# Patient Record
Sex: Male | Born: 1940 | Race: White | Hispanic: No | Marital: Married | Smoking: Never smoker
Health system: Southern US, Community
[De-identification: ages and names within clinical notes are randomized; demographics above are authoritative.]

## PROBLEM LIST (undated history)

## (undated) DIAGNOSIS — I214 Non-ST elevation (NSTEMI) myocardial infarction: Secondary | ICD-10-CM

## (undated) DIAGNOSIS — G6181 Chronic inflammatory demyelinating polyneuritis: Secondary | ICD-10-CM

## (undated) DIAGNOSIS — M159 Polyosteoarthritis, unspecified: Secondary | ICD-10-CM

## (undated) DIAGNOSIS — H547 Unspecified visual loss: Secondary | ICD-10-CM

## (undated) DIAGNOSIS — C186 Malignant neoplasm of descending colon: Secondary | ICD-10-CM

## (undated) DIAGNOSIS — M549 Dorsalgia, unspecified: Secondary | ICD-10-CM

## (undated) DIAGNOSIS — I059 Rheumatic mitral valve disease, unspecified: Secondary | ICD-10-CM

## (undated) DIAGNOSIS — Z8739 Personal history of other diseases of the musculoskeletal system and connective tissue: Secondary | ICD-10-CM

## (undated) DIAGNOSIS — I2510 Atherosclerotic heart disease of native coronary artery without angina pectoris: Secondary | ICD-10-CM

## (undated) DIAGNOSIS — G4733 Obstructive sleep apnea (adult) (pediatric): Secondary | ICD-10-CM

## (undated) DIAGNOSIS — K118 Other diseases of salivary glands: Secondary | ICD-10-CM

## (undated) DIAGNOSIS — D472 Monoclonal gammopathy: Secondary | ICD-10-CM

## (undated) DIAGNOSIS — I509 Heart failure, unspecified: Secondary | ICD-10-CM

## (undated) DIAGNOSIS — R011 Cardiac murmur, unspecified: Secondary | ICD-10-CM

## (undated) DIAGNOSIS — E785 Hyperlipidemia, unspecified: Secondary | ICD-10-CM

## (undated) DIAGNOSIS — I10 Essential (primary) hypertension: Secondary | ICD-10-CM

## (undated) DIAGNOSIS — J189 Pneumonia, unspecified organism: Secondary | ICD-10-CM

## (undated) DIAGNOSIS — T4145XA Adverse effect of unspecified anesthetic, initial encounter: Secondary | ICD-10-CM

## (undated) DIAGNOSIS — R002 Palpitations: Secondary | ICD-10-CM

## (undated) DIAGNOSIS — K5792 Diverticulitis of intestine, part unspecified, without perforation or abscess without bleeding: Secondary | ICD-10-CM

## (undated) DIAGNOSIS — D649 Anemia, unspecified: Secondary | ICD-10-CM

## (undated) DIAGNOSIS — G4730 Sleep apnea, unspecified: Secondary | ICD-10-CM

## (undated) DIAGNOSIS — M4810 Ankylosing hyperostosis [Forestier], site unspecified: Secondary | ICD-10-CM

## (undated) DIAGNOSIS — Z9989 Dependence on other enabling machines and devices: Secondary | ICD-10-CM

## (undated) DIAGNOSIS — Z950 Presence of cardiac pacemaker: Secondary | ICD-10-CM

## (undated) DIAGNOSIS — N189 Chronic kidney disease, unspecified: Secondary | ICD-10-CM

## (undated) DIAGNOSIS — G629 Polyneuropathy, unspecified: Secondary | ICD-10-CM

## (undated) DIAGNOSIS — E559 Vitamin D deficiency, unspecified: Secondary | ICD-10-CM

## (undated) DIAGNOSIS — C189 Malignant neoplasm of colon, unspecified: Secondary | ICD-10-CM

## (undated) DIAGNOSIS — Z87442 Personal history of urinary calculi: Secondary | ICD-10-CM

## (undated) DIAGNOSIS — K219 Gastro-esophageal reflux disease without esophagitis: Secondary | ICD-10-CM

## (undated) DIAGNOSIS — N401 Enlarged prostate with lower urinary tract symptoms: Secondary | ICD-10-CM

## (undated) DIAGNOSIS — D869 Sarcoidosis, unspecified: Secondary | ICD-10-CM

## (undated) DIAGNOSIS — Z86010 Personal history of colonic polyps: Secondary | ICD-10-CM

## (undated) DIAGNOSIS — J45909 Unspecified asthma, uncomplicated: Secondary | ICD-10-CM

## (undated) DIAGNOSIS — G8929 Other chronic pain: Secondary | ICD-10-CM

## (undated) DIAGNOSIS — H269 Unspecified cataract: Secondary | ICD-10-CM

## (undated) DIAGNOSIS — T8859XA Other complications of anesthesia, initial encounter: Secondary | ICD-10-CM

## (undated) DIAGNOSIS — G622 Polyneuropathy due to other toxic agents: Secondary | ICD-10-CM

## (undated) DIAGNOSIS — H4010X0 Unspecified open-angle glaucoma, stage unspecified: Secondary | ICD-10-CM

## (undated) DIAGNOSIS — J449 Chronic obstructive pulmonary disease, unspecified: Secondary | ICD-10-CM

## (undated) DIAGNOSIS — G6289 Other specified polyneuropathies: Secondary | ICD-10-CM

## (undated) DIAGNOSIS — G619 Inflammatory polyneuropathy, unspecified: Secondary | ICD-10-CM

## (undated) HISTORY — DX: Unspecified asthma, uncomplicated: J45.909

## (undated) HISTORY — DX: Essential (primary) hypertension: I10

## (undated) HISTORY — DX: Vitamin D deficiency, unspecified: E55.9

## (undated) HISTORY — DX: Sleep apnea, unspecified: G47.30

## (undated) HISTORY — DX: Personal history of colonic polyps: Z86.010

## (undated) HISTORY — DX: Unspecified visual loss: H54.7

## (undated) HISTORY — DX: Monoclonal gammopathy: D47.2

## (undated) HISTORY — DX: Rheumatic mitral valve disease, unspecified: I05.9

## (undated) HISTORY — DX: Ankylosing hyperostosis (forestier), site unspecified: M48.10

## (undated) HISTORY — DX: Malignant neoplasm of colon, unspecified: C18.9

## (undated) HISTORY — DX: Chronic kidney disease, unspecified: N18.9

## (undated) HISTORY — DX: Malignant neoplasm of descending colon: C18.6

## (undated) HISTORY — DX: Polyosteoarthritis, unspecified: M15.9

## (undated) HISTORY — DX: Diverticulitis of intestine, part unspecified, without perforation or abscess without bleeding: K57.92

## (undated) HISTORY — PX: EYE SURGERY: SHX253

## (undated) HISTORY — DX: Heart failure, unspecified: I50.9

## (undated) HISTORY — PX: COLONOSCOPY: SHX174

## (undated) HISTORY — DX: Gastro-esophageal reflux disease without esophagitis: K21.9

## (undated) HISTORY — DX: Sarcoidosis, unspecified: D86.9

## (undated) HISTORY — DX: Hyperlipidemia, unspecified: E78.5

## (undated) HISTORY — PX: CATARACT EXTRACTION W/ INTRAOCULAR LENS IMPLANT: SHX1309

## (undated) HISTORY — DX: Unspecified cataract: H26.9

## (undated) HISTORY — DX: Inflammatory polyneuropathy, unspecified: G61.9

## (undated) HISTORY — DX: Chronic obstructive pulmonary disease, unspecified: J44.9

## (undated) HISTORY — DX: Polyneuropathy due to other toxic agents: G62.2

## (undated) HISTORY — DX: Other specified polyneuropathies: G62.89

## (undated) HISTORY — DX: Chronic inflammatory demyelinating polyneuritis: G61.81

## (undated) HISTORY — DX: Atherosclerotic heart disease of native coronary artery without angina pectoris: I25.10

## (undated) HISTORY — DX: Benign prostatic hyperplasia with lower urinary tract symptoms: N40.1

## (undated) HISTORY — PX: MOHS SURGERY: SUR867

## (undated) HISTORY — DX: Palpitations: R00.2

## (undated) HISTORY — DX: Pneumonia, unspecified organism: J18.9

## (undated) HISTORY — DX: Other diseases of salivary glands: K11.8

---
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
## 1998-12-27 HISTORY — PX: COLON SURGERY: SHX602

## 1999-12-28 HISTORY — PX: LUNG SURGERY: SHX703

## 2001-12-27 HISTORY — PX: KNEE ARTHROSCOPY: SHX127

## 2008-12-27 DIAGNOSIS — K118 Other diseases of salivary glands: Secondary | ICD-10-CM

## 2008-12-27 HISTORY — DX: Other diseases of salivary glands: K11.8

## 2008-12-27 HISTORY — PX: PROSTATE BIOPSY: SHX241

## 2010-05-28 ENCOUNTER — Ambulatory Visit: Payer: Self-pay | Admitting: Internal Medicine

## 2010-05-28 DIAGNOSIS — C186 Malignant neoplasm of descending colon: Secondary | ICD-10-CM

## 2010-05-28 DIAGNOSIS — R351 Nocturia: Secondary | ICD-10-CM

## 2010-05-28 DIAGNOSIS — M19041 Primary osteoarthritis, right hand: Secondary | ICD-10-CM | POA: Insufficient documentation

## 2010-05-28 DIAGNOSIS — G619 Inflammatory polyneuropathy, unspecified: Secondary | ICD-10-CM

## 2010-05-28 DIAGNOSIS — E559 Vitamin D deficiency, unspecified: Secondary | ICD-10-CM | POA: Insufficient documentation

## 2010-05-28 DIAGNOSIS — Z860100 Personal history of colon polyps, unspecified: Secondary | ICD-10-CM | POA: Insufficient documentation

## 2010-05-28 DIAGNOSIS — D869 Sarcoidosis, unspecified: Secondary | ICD-10-CM

## 2010-05-28 DIAGNOSIS — I059 Rheumatic mitral valve disease, unspecified: Secondary | ICD-10-CM | POA: Insufficient documentation

## 2010-05-28 DIAGNOSIS — J4520 Mild intermittent asthma, uncomplicated: Secondary | ICD-10-CM | POA: Insufficient documentation

## 2010-05-28 DIAGNOSIS — E785 Hyperlipidemia, unspecified: Secondary | ICD-10-CM

## 2010-05-28 DIAGNOSIS — G622 Polyneuropathy due to other toxic agents: Secondary | ICD-10-CM | POA: Insufficient documentation

## 2010-05-28 DIAGNOSIS — R002 Palpitations: Secondary | ICD-10-CM | POA: Insufficient documentation

## 2010-05-28 DIAGNOSIS — J45901 Unspecified asthma with (acute) exacerbation: Secondary | ICD-10-CM | POA: Insufficient documentation

## 2010-05-28 DIAGNOSIS — N401 Enlarged prostate with lower urinary tract symptoms: Secondary | ICD-10-CM | POA: Insufficient documentation

## 2010-05-28 DIAGNOSIS — G4733 Obstructive sleep apnea (adult) (pediatric): Secondary | ICD-10-CM | POA: Insufficient documentation

## 2010-05-28 DIAGNOSIS — M19042 Primary osteoarthritis, left hand: Secondary | ICD-10-CM | POA: Insufficient documentation

## 2010-05-28 DIAGNOSIS — K219 Gastro-esophageal reflux disease without esophagitis: Secondary | ICD-10-CM | POA: Insufficient documentation

## 2010-05-28 DIAGNOSIS — Z85038 Personal history of other malignant neoplasm of large intestine: Secondary | ICD-10-CM | POA: Insufficient documentation

## 2010-05-28 DIAGNOSIS — J45909 Unspecified asthma, uncomplicated: Secondary | ICD-10-CM

## 2010-05-28 DIAGNOSIS — M159 Polyosteoarthritis, unspecified: Secondary | ICD-10-CM

## 2010-05-28 DIAGNOSIS — N138 Other obstructive and reflux uropathy: Secondary | ICD-10-CM

## 2010-05-28 HISTORY — DX: Other obstructive and reflux uropathy: N13.8

## 2010-05-28 HISTORY — DX: Obstructive sleep apnea (adult) (pediatric): G47.33

## 2010-05-28 HISTORY — DX: Benign prostatic hyperplasia with lower urinary tract symptoms: N40.1

## 2010-05-28 HISTORY — DX: Sarcoidosis, unspecified: D86.9

## 2010-05-28 HISTORY — DX: Palpitations: R00.2

## 2010-05-28 HISTORY — DX: Polyosteoarthritis, unspecified: M15.9

## 2010-05-28 HISTORY — DX: Vitamin D deficiency, unspecified: E55.9

## 2010-05-28 HISTORY — DX: Gastro-esophageal reflux disease without esophagitis: K21.9

## 2010-05-28 HISTORY — DX: Hyperlipidemia, unspecified: E78.5

## 2010-05-28 HISTORY — DX: Inflammatory polyneuropathy, unspecified: G62.2

## 2010-05-28 HISTORY — DX: Personal history of colonic polyps: Z86.010

## 2010-05-28 HISTORY — DX: Inflammatory polyneuropathy, unspecified: G61.9

## 2010-05-28 HISTORY — DX: Malignant neoplasm of descending colon: C18.6

## 2010-05-28 HISTORY — DX: Unspecified asthma, uncomplicated: J45.909

## 2010-05-28 HISTORY — DX: Rheumatic mitral valve disease, unspecified: I05.9

## 2010-05-28 HISTORY — DX: Personal history of colon polyps, unspecified: Z86.0100

## 2011-01-26 NOTE — Assessment & Plan Note (Signed)
Summary: COLD SYMPTOMS/ACUTE VISIT ONLY-PT AWARE/OK MEN/CD   Vital Signs:  Patient profile:   70 year old male Height:      70 inches (177.80 cm) Weight:      244.0 pounds (110.91 kg) BMI:     35.14 O2 Sat:      96 % on Room air Temp:     99.5 degrees F (37.50 degrees C) oral Pulse rate:   73 / minute BP sitting:   144 / 82  (left arm) Cuff size:   large  Vitals Entered By: Orlan Leavens (May 28, 2010 2:06 PM)  O2 Flow:  Room air CC: Cold sxs Is Patient Diabetic? No Pain Assessment Patient in pain? no        CC:  Cold sxs.  History of Present Illness: 1 month history of persistent respiratory infection and cough with exacerbation of Asthma such that he is using bronchodilator much more than usual. He has been feeling short of breath in addition to the increased wheezing. He has a h/o pnuemonia and is concerned for a recurrence.   Current Medications (verified): 1)  Norvasc 10 Mg Tabs (Amlodipine Besylate) .... Take 1 By Mouth Once Daily 2)  Triamterene-Hctz 37.5-25 Mg Tabs (Triamterene-Hctz) .... Take 1 By Mouth Once Daily 3)  Prevacid 30 Mg Cpdr (Lansoprazole) .... Take 1 By Mouth Once Daily 4)  Gabapentin 800 Mg Tabs (Gabapentin) .... Take 1 Three Times A Day 5)  Aspirin 81 Mg Tbec (Aspirin) .... Take 1 By Mouth Once Daily 6)  Multivitamins  Tabs (Multiple Vitamin) .... Take 1 By Mouth Once Daily 7)  Vitamin D3 5000 Unit Caps (Cholecalciferol) .... Take 1 By Mouth Once Daily 8)  Pulmicort Flexhaler 90 Mcg/act Aepb (Budesonide) .... Take 2 Puffs Two Times A Day 9)  Proventil Hfa 108 (90 Base) Mcg/act Aers (Albuterol Sulfate) .... 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 Lyla Glassing, [place], [place]              Oncologist/immunologist - Dr. Anell Barr, [place], [place]              Laurette Schimke -  Dr. Brooke Dare, [place]. [place]              General Surgeon - Dr. Zettie Cooley----(?) [place], [place]              Thoracic Surgeon - Dr. Carin Primrose, [place], [place]              Internist - Dr. 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 [place] End of Life - provided discussive context and provided packet  Review of Systems       The patient complains of fever, decreased hearing, peripheral edema, and prolonged cough.  The patient denies anorexia, weight loss, weight gain, hoarseness, chest pain, syncope, dyspnea on exertion, headaches, abdominal pain, severe indigestion/heartburn, muscle weakness, difficulty walking, depression, enlarged lymph nodes, and breast masses.    Physical Exam  General:  WNWD heavy set white male in no distress Head:  normocephalic and atraumatic.   Eyes:  corneas and lenses clear and  no injection.   Neck:  supple and no thyromegaly.   Chest Wall:  no deformities.   Lungs:  normal respiratory effort, no intercostal retractions, no crackles, and no wheezes, mild rhonchi in lower lung fields Heart:  normal rate, regular rhythm, and no murmur.   Abdomen:  soft, non-tender, and normal bowel sounds.     Impression & Recommendations:  Problem # 1:  BRONCHITIS, ACUTE (ICD-466.0) Mild acute bronchitis with probable cyclical cough  plan - doxycycline 100mg  two times a day x 7 days           for cough: benzonatate perles 100mg  three times a day x 10, prednisone for 10 days, prom/cod syrup q 6 as needed.  His updated medication list for this problem includes:    Pulmicort Flexhaler 90 Mcg/act Aepb (Budesonide) .[name]... Take 2 puffs two times a day    Proventil Hfa 108 (90 Base) Mcg/act Aers (Albuterol sulfate) ..... Use as needed    Doxycycline Hyclate 100 Mg Caps (Doxycycline hyclate) .[name]... 1 by mouth two times a day x 7 for bronchitis    Benzonatate 100 Mg Caps (Benzonatate) .[name]... 1 by mouth three times a day    Promethazine-codeine 6.25-10 Mg/87ml Syrp (Promethazine-codeine) .[name]... 1 -2 tsp q 6 as needed  Complete Medication List: 1)  Norvasc 10 Mg Tabs (Amlodipine besylate) .... Take 1 by mouth once daily 2)  Triamterene-hctz 37.5-25 Mg Tabs (Triamterene-hctz) .... Take 1 by mouth once daily 3)  Prevacid 30 Mg Cpdr (Lansoprazole) .... Take 1 by mouth once daily 4)  Gabapentin 800 Mg Tabs (Gabapentin) .... Take 1 three times a day 5)  Aspirin 81 Mg Tbec (Aspirin) .... Take 1 by mouth once daily 6)  Multivitamins Tabs (Multiple vitamin) .... Take 1 by mouth once daily 7)  Vitamin D3 5000 Unit Caps (Cholecalciferol) .... Take 1 by mouth once daily 8)  Pulmicort Flexhaler 90 Mcg/act Aepb (Budesonide) .... Take 2 puffs two times a day 9)  Proventil Hfa 108 (90 Base) Mcg/act Aers (Albuterol sulfate) .... Use as needed 10)  Doxycycline Hyclate 100 Mg Caps (Doxycycline hyclate) .[name]..  1 by mouth two times a day x 7 for bronchitis 11)  Benzonatate 100 Mg Caps (Benzonatate) .[name].. 1 by mouth three times a day 12)  Prednisone 20 Mg Tabs (Prednisone) .[name].. 1 by mouth once daily x 10 13)  Promethazine-codeine 6.25-10 Mg/65ml Syrp (Promethazine-codeine) .[name].. 1 -2 tsp q 6 as needed Prescriptions: PROMETHAZINE-CODEINE 6.25-10 MG/5ML SYRP (PROMETHAZINE-CODEINE) 1 -2 tsp q 6 as needed  #8 oz x 1   Entered and Authorized by:   Jacques Navy MD   Signed by:   Jacques Navy MD on 05/28/2010   Method used:   Handwritten   RxID:   [id number] PREDNISONE 20 MG TABS (PREDNISONE)  1 by mouth once daily x 10  #10 x 0   Entered and Authorized by:   Jacques Navy MD   Signed by:   Jacques Navy MD on 05/28/2010   Method used:   Electronically to        [facility] (retail)       [address]       [place], [place]  [address]       Ph: [phone]       Fax: [phone]   RxID:   [id number] BENZONATATE 100 MG CAPS (BENZONATATE) 1 by mouth three times a day  #30 x 1   Entered and Authorized by:   Jacques Navy MD   Signed by:   Jacques Navy MD on 05/28/2010   Method used:   Electronically to        [facility] (retail)       [address]       [name], [place]  [address]       Ph: [phone]       Fax: [phone]   RxID:   [id number] DOXYCYCLINE HYCLATE 100 MG CAPS (DOXYCYCLINE HYCLATE) 1 by mouth two times a day x 7 for bronchitis  #14 x 0   Entered and Authorized by:   Jacques Navy MD   Signed by:   Jacques Navy MD on 05/28/2010   Method used:   Electronically to        [facility] (retail)       [address]       [place], [place]  [address]       Ph: [phone]       Fax: [phone]   RxID:   [id number]

## 2011-06-01 ENCOUNTER — Inpatient Hospital Stay (HOSPITAL_COMMUNITY)
Admission: EM | Admit: 2011-06-01 | Discharge: 2011-06-02 | Disposition: A | Discharge status: Other Acute Inpatient Hospital | Payer: Medicare Other | Source: Home / Self Care | Attending: Internal Medicine | Admitting: Internal Medicine

## 2011-06-01 ENCOUNTER — Emergency Department (HOSPITAL_COMMUNITY): Payer: Medicare Other

## 2011-06-01 LAB — DIFFERENTIAL
Basophils Absolute: 0.1 10*3/uL (ref 0.0–0.1)
Basophils Relative: 1 % (ref 0–1)
Eosinophils Absolute: 0.5 10*3/uL (ref 0.0–0.7)
Eosinophils Relative: 7 % — ABNORMAL HIGH (ref 0–5)
Lymphocytes Relative: 20 % (ref 12–46)
Lymphs Abs: 1.4 10*3/uL (ref 0.7–4.0)
Monocytes Absolute: 0.9 10*3/uL (ref 0.1–1.0)
Monocytes Relative: 13 % — ABNORMAL HIGH (ref 3–12)
Neutro Abs: 3.9 10*3/uL (ref 1.7–7.7)
Neutrophils Relative %: 59 % (ref 43–77)

## 2011-06-01 LAB — CK TOTAL AND CKMB (NOT AT ARMC)
CK, MB: 5.9 ng/mL — ABNORMAL HIGH (ref 0.3–4.0)
Relative Index: 2.2 (ref 0.0–2.5)
Total CK: 263 U/L — ABNORMAL HIGH (ref 7–232)

## 2011-06-01 LAB — CBC
HCT: 45 % (ref 39.0–52.0)
Hemoglobin: 16 g/dL (ref 13.0–17.0)
MCH: 31.1 pg (ref 26.0–34.0)
MCHC: 35.6 g/dL (ref 30.0–36.0)
MCV: 87.5 fL (ref 78.0–100.0)
Platelets: 201 10*3/uL (ref 150–400)
RBC: 5.14 MIL/uL (ref 4.22–5.81)
RDW: 12.8 % (ref 11.5–15.5)
WBC: 6.7 10*3/uL (ref 4.0–10.5)

## 2011-06-01 LAB — BASIC METABOLIC PANEL
BUN: 15 mg/dL (ref 6–23)
CO2: 28 mEq/L (ref 19–32)
Calcium: 11.1 mg/dL — ABNORMAL HIGH (ref 8.4–10.5)
Chloride: 98 mEq/L (ref 96–112)
Creatinine, Ser: 0.8 mg/dL (ref 0.4–1.5)
GFR calc Af Amer: >60 mL/min (ref >60)
GFR calc non Af Amer: >60 mL/min (ref >60)
Glucose, Bld: 102 mg/dL — ABNORMAL HIGH (ref 70–99)
Potassium: 3.8 mEq/L (ref 3.5–5.1)
Sodium: 138 mEq/L (ref 135–145)

## 2011-06-01 LAB — TROPONIN I: Troponin I: <0.3 ng/mL (ref <0.30)

## 2011-06-02 ENCOUNTER — Emergency Department (HOSPITAL_COMMUNITY): Payer: Medicare Other

## 2011-06-02 ENCOUNTER — Encounter (HOSPITAL_COMMUNITY): Payer: Self-pay

## 2011-06-02 ENCOUNTER — Inpatient Hospital Stay (HOSPITAL_COMMUNITY)
Admission: AD | Admit: 2011-06-02 | Discharge: 2011-06-05 | DRG: 247 | Disposition: A | Discharge status: Home / Self Care | Payer: Medicare Other | Source: Other Acute Inpatient Hospital | Attending: Cardiology | Admitting: Cardiology

## 2011-06-02 DIAGNOSIS — I214 Non-ST elevation (NSTEMI) myocardial infarction: Principal | ICD-10-CM | POA: Diagnosis present

## 2011-06-02 DIAGNOSIS — K219 Gastro-esophageal reflux disease without esophagitis: Secondary | ICD-10-CM | POA: Diagnosis present

## 2011-06-02 DIAGNOSIS — R339 Retention of urine, unspecified: Secondary | ICD-10-CM | POA: Diagnosis present

## 2011-06-02 DIAGNOSIS — Z8249 Family history of ischemic heart disease and other diseases of the circulatory system: Secondary | ICD-10-CM

## 2011-06-02 DIAGNOSIS — N401 Enlarged prostate with lower urinary tract symptoms: Secondary | ICD-10-CM | POA: Diagnosis present

## 2011-06-02 DIAGNOSIS — C8589 Other specified types of non-Hodgkin lymphoma, extranodal and solid organ sites: Secondary | ICD-10-CM | POA: Diagnosis present

## 2011-06-02 DIAGNOSIS — Z7982 Long term (current) use of aspirin: Secondary | ICD-10-CM

## 2011-06-02 DIAGNOSIS — G6181 Chronic inflammatory demyelinating polyneuritis: Secondary | ICD-10-CM | POA: Diagnosis present

## 2011-06-02 DIAGNOSIS — I440 Atrioventricular block, first degree: Secondary | ICD-10-CM | POA: Diagnosis present

## 2011-06-02 DIAGNOSIS — I2589 Other forms of chronic ischemic heart disease: Secondary | ICD-10-CM | POA: Diagnosis present

## 2011-06-02 DIAGNOSIS — Z85038 Personal history of other malignant neoplasm of large intestine: Secondary | ICD-10-CM

## 2011-06-02 DIAGNOSIS — E785 Hyperlipidemia, unspecified: Secondary | ICD-10-CM | POA: Diagnosis present

## 2011-06-02 DIAGNOSIS — I498 Other specified cardiac arrhythmias: Secondary | ICD-10-CM | POA: Diagnosis not present

## 2011-06-02 DIAGNOSIS — N138 Other obstructive and reflux uropathy: Secondary | ICD-10-CM | POA: Diagnosis present

## 2011-06-02 DIAGNOSIS — R072 Precordial pain: Secondary | ICD-10-CM

## 2011-06-02 DIAGNOSIS — R079 Chest pain, unspecified: Secondary | ICD-10-CM

## 2011-06-02 DIAGNOSIS — Z79899 Other long term (current) drug therapy: Secondary | ICD-10-CM

## 2011-06-02 DIAGNOSIS — E876 Hypokalemia: Secondary | ICD-10-CM | POA: Diagnosis not present

## 2011-06-02 DIAGNOSIS — I2510 Atherosclerotic heart disease of native coronary artery without angina pectoris: Secondary | ICD-10-CM | POA: Diagnosis present

## 2011-06-02 DIAGNOSIS — I2582 Chronic total occlusion of coronary artery: Secondary | ICD-10-CM | POA: Diagnosis present

## 2011-06-02 DIAGNOSIS — I10 Essential (primary) hypertension: Secondary | ICD-10-CM | POA: Diagnosis present

## 2011-06-02 DIAGNOSIS — D869 Sarcoidosis, unspecified: Secondary | ICD-10-CM | POA: Diagnosis present

## 2011-06-02 LAB — BASIC METABOLIC PANEL
BUN: 16 mg/dL (ref 6–23)
CO2: 31 mEq/L (ref 19–32)
Calcium: 10.1 mg/dL (ref 8.4–10.5)
Chloride: 101 mEq/L (ref 96–112)
Creatinine, Ser: 0.81 mg/dL (ref 0.4–1.5)
GFR calc Af Amer: >60 mL/min (ref >60)
GFR calc non Af Amer: >60 mL/min (ref >60)
Glucose, Bld: 111 mg/dL — ABNORMAL HIGH (ref 70–99)
Potassium: 3.2 mEq/L — ABNORMAL LOW (ref 3.5–5.1)
Sodium: 139 mEq/L (ref 135–145)

## 2011-06-02 LAB — LIPID PANEL
Cholesterol: 197 mg/dL (ref 0–200)
HDL: 37 mg/dL — ABNORMAL LOW (ref >39)
LDL Cholesterol: 115 mg/dL — ABNORMAL HIGH (ref 0–99)
Total CHOL/HDL Ratio: 5.3 RATIO
Triglycerides: 224 mg/dL — ABNORMAL HIGH (ref <150)
VLDL: 45 mg/dL — ABNORMAL HIGH (ref 0–40)

## 2011-06-02 LAB — MRSA PCR SCREENING: MRSA by PCR: NEGATIVE

## 2011-06-02 LAB — CARDIAC PANEL(CRET KIN+CKTOT+MB+TROPI)
CK, MB: 14.7 ng/mL (ref 0.3–4.0)
CK, MB: 19.6 ng/mL (ref 0.3–4.0)
Relative Index: 5.4 — ABNORMAL HIGH (ref 0.0–2.5)
Relative Index: 6.3 — ABNORMAL HIGH (ref 0.0–2.5)
Total CK: 271 U/L — ABNORMAL HIGH (ref 7–232)
Total CK: 309 U/L — ABNORMAL HIGH (ref 7–232)
Troponin I: 1.07 ng/mL (ref <0.30)
Troponin I: 2.71 ng/mL (ref <0.30)

## 2011-06-02 LAB — D-DIMER, QUANTITATIVE: D-Dimer, Quant: 0.59 ug/mL-FEU — ABNORMAL HIGH (ref 0.00–0.48)

## 2011-06-02 MED ORDER — IOHEXOL 300 MG/ML  SOLN
100.0000 mL | Freq: Once | INTRAMUSCULAR | Status: AC | PRN
  Administered 2011-06-02: 100 mL via INTRAVENOUS

## 2011-06-03 DIAGNOSIS — I214 Non-ST elevation (NSTEMI) myocardial infarction: Secondary | ICD-10-CM

## 2011-06-03 DIAGNOSIS — I2510 Atherosclerotic heart disease of native coronary artery without angina pectoris: Secondary | ICD-10-CM

## 2011-06-03 HISTORY — DX: Non-ST elevation (NSTEMI) myocardial infarction: I21.4

## 2011-06-03 HISTORY — PX: CORONARY ANGIOPLASTY WITH STENT PLACEMENT: SHX49

## 2011-06-03 LAB — COMPREHENSIVE METABOLIC PANEL
ALT: 24 U/L (ref 0–53)
AST: 30 U/L (ref 0–37)
Albumin: 3.6 g/dL (ref 3.5–5.2)
Alkaline Phosphatase: 50 U/L (ref 39–117)
BUN: 12 mg/dL (ref 6–23)
CO2: 26 mEq/L (ref 19–32)
Calcium: 9.4 mg/dL (ref 8.4–10.5)
Chloride: 103 mEq/L (ref 96–112)
Creatinine, Ser: 0.76 mg/dL (ref 0.4–1.5)
GFR calc Af Amer: >60 mL/min (ref >60)
GFR calc non Af Amer: >60 mL/min (ref >60)
Glucose, Bld: 94 mg/dL (ref 70–99)
Potassium: 3.4 mEq/L — ABNORMAL LOW (ref 3.5–5.1)
Sodium: 140 mEq/L (ref 135–145)
Total Bilirubin: 0.5 mg/dL (ref 0.3–1.2)
Total Protein: 6.5 g/dL (ref 6.0–8.3)

## 2011-06-03 LAB — CBC
HCT: 40.8 % (ref 39.0–52.0)
Hemoglobin: 14.3 g/dL (ref 13.0–17.0)
MCH: 30.8 pg (ref 26.0–34.0)
MCHC: 35 g/dL (ref 30.0–36.0)
MCV: 87.9 fL (ref 78.0–100.0)
Platelets: 163 10*3/uL (ref 150–400)
RBC: 4.64 MIL/uL (ref 4.22–5.81)
RDW: 12.9 % (ref 11.5–15.5)
WBC: 4.9 10*3/uL (ref 4.0–10.5)

## 2011-06-03 LAB — TROPONIN I: Troponin I: 2.27 ng/mL (ref <0.30)

## 2011-06-03 LAB — PROTIME-INR
INR: 1.15 (ref 0.00–1.49)
Prothrombin Time: 14.9 seconds (ref 11.6–15.2)

## 2011-06-03 LAB — CK TOTAL AND CKMB (NOT AT ARMC)
CK, MB: 12.4 ng/mL (ref 0.3–4.0)
Relative Index: 4.8 — ABNORMAL HIGH (ref 0.0–2.5)
Total CK: 261 U/L — ABNORMAL HIGH (ref 7–232)

## 2011-06-03 LAB — HEPARIN LEVEL (UNFRACTIONATED): Heparin Unfractionated: 0.78 IU/mL — ABNORMAL HIGH (ref 0.30–0.70)

## 2011-06-03 LAB — POCT ACTIVATED CLOTTING TIME
Activated Clotting Time: 187 seconds
Activated Clotting Time: 246 seconds

## 2011-06-04 DIAGNOSIS — I214 Non-ST elevation (NSTEMI) myocardial infarction: Secondary | ICD-10-CM

## 2011-06-04 LAB — BASIC METABOLIC PANEL
BUN: 12 mg/dL (ref 6–23)
CO2: 26 mEq/L (ref 19–32)
Calcium: 9.4 mg/dL (ref 8.4–10.5)
Chloride: 103 mEq/L (ref 96–112)
Creatinine, Ser: 0.8 mg/dL (ref 0.4–1.5)
GFR calc Af Amer: >60 mL/min (ref >60)
GFR calc non Af Amer: >60 mL/min (ref >60)
Glucose, Bld: 87 mg/dL (ref 70–99)
Potassium: 3.6 mEq/L (ref 3.5–5.1)
Sodium: 137 mEq/L (ref 135–145)

## 2011-06-04 LAB — CBC
HCT: 39.9 % (ref 39.0–52.0)
Hemoglobin: 14.2 g/dL (ref 13.0–17.0)
MCH: 31.7 pg (ref 26.0–34.0)
MCHC: 35.6 g/dL (ref 30.0–36.0)
MCV: 89.1 fL (ref 78.0–100.0)
Platelets: 157 10*3/uL (ref 150–400)
RBC: 4.48 MIL/uL (ref 4.22–5.81)
RDW: 13.1 % (ref 11.5–15.5)
WBC: 6.9 10*3/uL (ref 4.0–10.5)

## 2011-06-04 LAB — HEPARIN LEVEL (UNFRACTIONATED): Heparin Unfractionated: 0.1 IU/mL — ABNORMAL LOW (ref 0.30–0.70)

## 2011-06-04 LAB — CARDIAC PANEL(CRET KIN+CKTOT+MB+TROPI)
CK, MB: 5.8 ng/mL — ABNORMAL HIGH (ref 0.3–4.0)
Relative Index: 3.9 — ABNORMAL HIGH (ref 0.0–2.5)
Total CK: 147 U/L (ref 7–232)
Troponin I: 1.02 ng/mL (ref <0.30)

## 2011-06-05 LAB — CBC
HCT: 41 % (ref 39.0–52.0)
Hemoglobin: 14.3 g/dL (ref 13.0–17.0)
MCH: 31.2 pg (ref 26.0–34.0)
MCHC: 34.9 g/dL (ref 30.0–36.0)
MCV: 89.5 fL (ref 78.0–100.0)
Platelets: 161 10*3/uL (ref 150–400)
RBC: 4.58 MIL/uL (ref 4.22–5.81)
RDW: 12.8 % (ref 11.5–15.5)
WBC: 7.8 10*3/uL (ref 4.0–10.5)

## 2011-06-05 LAB — BASIC METABOLIC PANEL
BUN: 14 mg/dL (ref 6–23)
CO2: 29 mEq/L (ref 19–32)
Calcium: 9.8 mg/dL (ref 8.4–10.5)
Chloride: 101 mEq/L (ref 96–112)
Creatinine, Ser: 0.85 mg/dL (ref 0.4–1.5)
GFR calc Af Amer: >60 mL/min (ref >60)
GFR calc non Af Amer: >60 mL/min (ref >60)
Glucose, Bld: 99 mg/dL (ref 70–99)
Potassium: 4.2 mEq/L (ref 3.5–5.1)
Sodium: 139 mEq/L (ref 135–145)

## 2011-06-06 NOTE — H&P (Addendum)
NAME:  NASEEM, VARDEN NO.:  [id number]  MEDICAL RECORD NO.:  [id number]  LOCATION:  1431                         FACILITY:  [facility]  PHYSICIAN:  Tarry Kos, MD       DATE OF BIRTH:  December 02, 1941  DATE OF ADMISSION:  06/01/2011 DATE OF DISCHARGE:                             HISTORY & PHYSICAL   CHIEF COMPLAINT:  Chest pain.  HISTORY OF PRESENT ILLNESS:  Mr. Olesen is a 70 year old male with no known history of coronary artery disease who presents to the emergency department with a chest pain that started earlier this afternoon that was substernal in nature with bilateral arm pain.  He denies any nausea, vomiting with it.  No shortness of breath.  He denies having any cough or fevers.  Denies any abdominal pain.  This all started at rest.  He came to the ED and was given some nitroglycerin sublingual which did not immediately have any effect on his chest pain.  He currently is chest pain free and has no complaints.  REVIEW OF SYSTEMS:  Otherwise negative.  PAST MEDICAL HISTORY: 1. He has a history of lymphoma.  He is status post Rituxan treatment,     last treatment was on April 15, 2011.  He has previous history of     being diagnosed with sarcoidosis of lungs. 2. Hypertension over 20 years ago. 3. GERD. 4. Status post colon resection. 5. Status post appendectomy.  ALLERGIES:  None.  SOCIAL HISTORY:  He is a nonsmoker.  No IV drug abuse.  Occasional wine. One to two glasses daily.  MEDICATIONS:  He takes gabapentin, Norvasc, Prevacid, triamterene, HCTZ.  PHYSICAL EXAMINATION:  VITAL SIGNS:  Temperature 97.6, blood pressure 149/77, pulse 62, respirations 20, 96% O2 sats on room air. GENERAL:  He is alert and oriented x4.  No apparent distress. Comfortable feeling. COR:  Regular rate and rhythm without murmur, rubs or gallops. CHEST:  Clear to auscultation bilaterally.  No wheeze or rales. ABDOMEN:  Soft, nontender, nondistended.  Positive bowel sounds.   No hepatosplenomegaly. EXTREMITIES:  No clubbing, cyanosis or edema. PSYCH:  Normal mood and affect. NEURO:  No focal neurologic deficits. SKIN:  No rashes.  LABORATORY DATA:  His first CK-MB is marginally elevated of 5.9.  His troponin is less than 3.  His electrolytes are normal except calcium is slightly high at 11.1.  BUN and creatinine are normal.  White count is normal.  Hemoglobin is normal.  A 12-lead EKG is normal sinus rhythm with PAC, no acute ST-T wave changes, first-degree AV block.  ASSESSMENT/PLAN:  This is a 70 year old male with atypical chest pain. 1. Atypical chest pain/unstable angina.  We will place the patient on     aspirin and full dose Lovenox because of his marginally elevated CK-     MB until he ruled out with serial cardiac enzymes.  We  will check     a 2-D echo in the morning and a fasting lipid panel in the morning.     We will also place him on Toprol.  Provide with morphine as needed     for pain.  I have written orders to  call MD if he has any     recurrence of chest pain at which point we will have to add     nitroglycerin paste.  I have written also to try some Mylanta as     this may be GI issue. 2. Mildly elevated calcium level.  I am going to recheck a BMP in the     morning and place him on some small amount of fluids overnight. 3. History of lymphoma, just completed treatment, this seems to be     stable. 4. History of sarcoidosis.  He is not having any symptoms of issue     with his sarcoidosis, he is not having any cough or hemoptysis and     his chest x-ray is negative. 5. We will check a D-dimer.  If positive, we will check a CT of his     chest to rule out for pulmonary emboli.  Further recommendation     pending overall hospital course.          ______________________________ Tarry Kos, MD     RD/MEDQ  D:  06/02/2011  T:  06/02/2011  Job:  [id number]  Electronically Signed by Tarry Kos MD on 06/06/2011 07:08:18 PM

## 2011-06-07 ENCOUNTER — Telehealth: Payer: Self-pay | Admitting: Cardiovascular Disease

## 2011-06-07 ENCOUNTER — Telehealth: Payer: Self-pay

## 2011-06-07 NOTE — Telephone Encounter (Signed)
[facility] Triage Call Report Triage Record Num: [id number] Operator: Audelia Hives Patient Name: Derrick [name] Call Date & Time: 06/05/2011 8:40:44PM Patient Phone: [phone] PCP: Illene Regulus Patient Gender: Male PCP Fax : [phone] Patient DOB: [birth year] Practice Name: [facility] Reason for Call: Jaskirat painful urination and urgency, onset 06/05/11 has fever of 100. Was in the hospital under Dr. Randolm Idol care(cardiologist) for heart cath and stent placement 06/03/11. Had catheter and was removed 06/04/11. Started with s/s after the cath was removed but worsening of s/s 06/05/11. Emergent s/s for Urinary Symptoms r/o per protocol except for call provider immediately, spoke with Dr. Laury Axon and new orders for Cipro 500 mg po BID x 5 days and called to [facility] [phone]. Strongly advised per MD if still with s/s proceed to ED/UC and compliant. Protocol(s) Used: Urinary Symptoms - Male Recommended Outcome per Protocol: Call Provider Immediately Reason for Outcome: Current or recent urinary tract instrumentation (e.g., temporary catheter) AND urinary tract symptoms OR no urine flow Care Advice: Avoid fluids containing caffeine (coffee, tea, cola or chocolate), alcohol, or citrus juice because they may irritate the bladder. Use of tobacco can also be a bladder irritant. ~ ~ IMMEDIATE ACTION Increase intake of fluids to 8-16 eight oz. glasses (2,000 to 4,000 cc per day or 1.6 to 3.2 L) so urine is a pale yellow color, to help flush bacteria from the bladder, unless instructed to restrict fluid intake for other medical reasons. Limit fluids with sugar. ~ 06/05/2011 9:11:15PM Page 1 of 1 [email or web address]

## 2011-06-07 NOTE — Telephone Encounter (Signed)
Need prior Auth through [employer] rx plan. Id # [id number]- brilinta 90 mg. 90 day supply. [facility] on [address]

## 2011-06-08 NOTE — Telephone Encounter (Signed)
Form in Dr [employer] folder for signature. Will fax prior authorization form today.

## 2011-06-12 ENCOUNTER — Encounter: Payer: Self-pay | Admitting: Internal Medicine

## 2011-06-15 ENCOUNTER — Ambulatory Visit (INDEPENDENT_AMBULATORY_CARE_PROVIDER_SITE_OTHER): Payer: Medicare Other | Admitting: Internal Medicine

## 2011-06-15 DIAGNOSIS — G619 Inflammatory polyneuropathy, unspecified: Secondary | ICD-10-CM

## 2011-06-15 DIAGNOSIS — N401 Enlarged prostate with lower urinary tract symptoms: Secondary | ICD-10-CM

## 2011-06-15 DIAGNOSIS — G622 Polyneuropathy due to other toxic agents: Secondary | ICD-10-CM

## 2011-06-15 DIAGNOSIS — D472 Monoclonal gammopathy: Secondary | ICD-10-CM

## 2011-06-15 DIAGNOSIS — E785 Hyperlipidemia, unspecified: Secondary | ICD-10-CM

## 2011-06-15 DIAGNOSIS — N138 Other obstructive and reflux uropathy: Secondary | ICD-10-CM

## 2011-06-15 DIAGNOSIS — C4491 Basal cell carcinoma of skin, unspecified: Secondary | ICD-10-CM

## 2011-06-15 DIAGNOSIS — D869 Sarcoidosis, unspecified: Secondary | ICD-10-CM

## 2011-06-16 DIAGNOSIS — C4491 Basal cell carcinoma of skin, unspecified: Secondary | ICD-10-CM | POA: Insufficient documentation

## 2011-06-16 DIAGNOSIS — D472 Monoclonal gammopathy: Secondary | ICD-10-CM | POA: Insufficient documentation

## 2011-06-16 NOTE — Assessment & Plan Note (Signed)
Markedly enlarged prostate that does impinge upon the bladder per CT scan. No prostate cancer by biopsy done in [place] in the past 2 years.  Plan - refer to Dr. Laverle Patter to establish for care and treatment

## 2011-06-16 NOTE — Progress Notes (Signed)
Subjective:    Patient ID: Derrick [name], male    DOB: Jun 15, 1941, 70 y.o.   MRN: [id number]  HPI Derrick [name] is very nice 70 y/o who returns for follow-up. In the interval since his initial visit a little more than a year ago he has had an eventful medical history. 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 At this time the approach is watchful waiting. He does wish to establish with a Insurance risk surveyor.  June 6th he was admitted with atypical chest pain. 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 After emergent CVTS consult Dr. Excell Seltzer was able to proceed with PCI/stenting of the RCA. Derrick [name] did well and was d/c to home June 9th. The plan is for him to see Dr. Excell Seltzer in follow-up June 26th. He will scheduled for relook cath with reverse flow study of the left main artery and if he has a truly occlusive lesion he will be set up for elective CABG.  Derrick [name] has significant BPH with several episodes of urinary retention associated with even mild anesthesia. He is on Rapaflo with minimal improvement in flow. He has had urology consult and negative prostate biopsy. Nonetheless, his urologist felt use of avodart or proscar carried too high a risk of prostate malignancy. After his recent hospitalization Derrick [name] did have mild urinary retention requiring foley catheter and subsequent to that he had a UTI, now resolved. He wants to establish with a local urologist and understands that he may be a surgical candidate for TUR-P after his cardiac issues are stable.   Derrick [name] has a history of skin cancers, basal cell, with excision of lesions from the frontal scalp and from behind the left ear. He would like to establish with a local dermatologist.   For all of these issues he is feeling well. He has a very positive and thankful attitude in  regard to his medical care.   Past Medical History  Diagnosis Date  . BENIGN PROSTATIC HYPERTROPHY, WITH OBSTRUCTION 05/28/2010  . COLONIC POLYPS, ADENOMATOUS, HX OF 05/28/2010  . GENERALIZED OSTEOARTHROSIS UNSPECIFIED SITE 05/28/2010  . GERD 05/28/2010  . HYPERLIPIDEMIA 05/28/2010  . Intrinsic asthma, 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  . Malignant neoplasm of descending colon 05/28/2010  . MITRAL VALVE PROLAPSE 05/28/2010  . 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 PALPITATIONS, CHRONIC 05/28/2010  . SLEEP APNEA, OBSTRUCTIVE, MODERATE 05/28/2010  . UNSPECIFIED INFLAMMATORY AND TOXIC NEUROPATHY 05/28/2010  . VITAMIN D DEFICIENCY 05/28/2010   Past Surgical History  Procedure Date  . Tonsillectomy   . Colon surgery     decending colon 200  . Lung wedge 2001  . Appendectomy 1954   Family History  Problem Relation Age of Onset  . Arthritis Mother     severe  . Coronary artery disease Mother   . Coronary artery disease Brother    History   Social History  . Marital Status: Married    Spouse Name: N/A    Number of Children: N/A  . Years of Education: N/A   Occupational History  . Not on file.   Social History Main Topics  . Smoking status: Never Smoker   . Smokeless tobacco: Not on file  . Alcohol Use: No  . Drug Use: No  . Sexually Active:    Other Topics Concern  . 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 Married "66" 1 son- 38; 1 dtr "67" 2 grandchildren. 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 End of life-provided discussive context and provided packet.       Review of Systems Review of Systems  Constitutional:  Negative for fever, chills, activity change and unexpected weight change.  HEENT:  Negative for hearing loss, ear pain, congestion, neck stiffness and postnasal drip. Negative for sore throat or swallowing problems. Negative for dental complaints.   Eyes: Negative for vision loss or change in visual acuity.    Respiratory: Negative for chest tightness and wheezing.   Cardiovascular: Negative for chest pain and palpitations since his hospitalization. He has not returned to full activity pending further cardiac evaluation. Gastrointestinal: No change in bowel habit. No bloating or gas. No reflux or indigestion Genitourinary: Negative for urgency, frequency, flank pain and difficulty urinating.  Musculoskeletal: Negative for myalgias, back pain, arthralgias and gait problem.  Neurological: Negative for dizziness, tremors, weakness and headaches.  Hematological: Negative for adenopathy.  Psychiatric/Behavioral: Negative for behavioral problems and dysphoric mood.       Objective:   Physical Exam Vitals noted Gen'l - WNWD white male in no distress or discomfort HEENT C&S clear, normal skull Pul - normal respirations Cor - 2 + radial and ulnar pulses right hand with normal flow to hand. RRR Derm - Marked bruising of the right wrist with no palpable hematoma or mass. There is minor tenderness to this area.       Assessment & Plan:

## 2011-06-16 NOTE — Assessment & Plan Note (Signed)
Symptoms are stable.  Plan - will refer to Dr. Marylou Flesher

## 2011-06-16 NOTE — Assessment & Plan Note (Signed)
No respiratory complaints or problems at this time.

## 2011-06-16 NOTE — Assessment & Plan Note (Signed)
In-hospital lab June 6th with cholesterol of 197, HDL 37, LDL 115. His statin medication was adjusted at discharge.   Plan - follow up lipid panel 6 weeks after discharge.           LDL goal is <80.

## 2011-06-16 NOTE — Assessment & Plan Note (Signed)
No specific lesion of concern at this time.  Plan - will refer to Dr. Emily Filbert for on-going managment

## 2011-06-16 NOTE — Assessment & Plan Note (Signed)
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  Plan - refer to Dr. Cyndie Chime for longitudinal care.

## 2011-06-22 ENCOUNTER — Encounter: Payer: Self-pay | Admitting: *Deleted

## 2011-06-22 ENCOUNTER — Encounter: Payer: Self-pay | Admitting: Cardiovascular Disease

## 2011-06-22 ENCOUNTER — Ambulatory Visit (INDEPENDENT_AMBULATORY_CARE_PROVIDER_SITE_OTHER): Payer: Medicare Other | Admitting: Cardiovascular Disease

## 2011-06-22 VITALS — BP 128/83 | HR 63 | Ht 70.0 in | Wt 237.0 lb

## 2011-06-22 DIAGNOSIS — I2510 Atherosclerotic heart disease of native coronary artery without angina pectoris: Secondary | ICD-10-CM

## 2011-06-22 DIAGNOSIS — I214 Non-ST elevation (NSTEMI) myocardial infarction: Secondary | ICD-10-CM

## 2011-06-22 DIAGNOSIS — I25119 Atherosclerotic heart disease of native coronary artery with unspecified angina pectoris: Secondary | ICD-10-CM | POA: Insufficient documentation

## 2011-06-22 DIAGNOSIS — E785 Hyperlipidemia, unspecified: Secondary | ICD-10-CM

## 2011-06-22 MED ORDER — TICAGRELOR 90 MG PO TABS: 90.0000 mg | ORAL_TABLET | Freq: Two times a day (BID) | ORAL | Status: DC

## 2011-06-22 MED ORDER — ATORVASTATIN CALCIUM 20 MG PO TABS: 20.0000 mg | ORAL_TABLET | Freq: Every day | ORAL | Status: DC

## 2011-06-22 NOTE — Progress Notes (Signed)
HPI:  This is a patient presented with a non-ST elevation myocardial infarction June 01, 2011. He developed recurrent chest pain early on in his hospitalization and required urgent cardiac catheterization. As the patient was being moved to the cardiac catheter table, he developed fairly severe chest discomfort. He underwent cardiac catheter demonstrating total occlusion of the right coronary artery and he was treated with primary PCI using a long drug eluding stent. He had marked resolution of symptoms as soon as his right coronary was recanalized. 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 After review of the situation with Dr. Cornelius Moras, we agreed that the best way to proceed is with treatment of his infarct vessel and the lack of followup of his left main which appeared noncritical.  The patient now presents for followup evaluation. Overall he is doing well. He has had some episodic pains in the left parascapular region. The patient's anginal equivalent was pain in the mid scapular region as well as the upper arms. His pain has similar characteristics but he's had no arm pain. He's had no exertional chest pain or pressure. He denies dyspnea, edema, orthopnea, or PND.  The patient has a fairly complex medical history. 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 This is been associated with elevation of IgM proteins, although the elevation is been minimal according to the patient. He underwent bone marrow biopsy that was negative for myeloma or lymphoma. He was treated with 2 cycles of Rituxan. He has been followed by hematology/oncology physicians in [place]. He plans on establishing care at the cancer center here in [place]. 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  Outpatient Encounter Prescriptions as of 06/22/2011  Medication Sig Dispense Refill  . albuterol (PROVENTIL HFA) 108 (90 BASE) MCG/ACT inhaler Inhale 2 puffs into the lungs every 6 (six) hours as needed.        [name] amLODipine (NORVASC) 10 MG tablet Take 10 mg by mouth daily.        [name] aspirin 81 MG tablet Take 81 mg by mouth daily.        [name] atorvastatin (LIPITOR) 20 MG tablet Take 1 tablet (20 mg total) by mouth daily.  30 tablet  6  . Budesonide (PULMICORT FLEXHALER) 90 MCG/ACT inhaler Inhale 2 puffs into the lungs 2 (two) times daily.        [name] gabapentin (NEURONTIN) 800 MG tablet Take 800 mg by mouth 3 (three) times daily.        . lansoprazole (PREVACID) 30 MG capsule Take 30 mg by mouth daily.        . Multiple Vitamin (MULTIVITAMIN) tablet Take 1 tablet by mouth daily.        . nitroGLYCERIN (NITROSTAT) 0.4 MG SL tablet Place 0.4 mg under the tongue every 5 (five) minutes as needed.        . silodosin (RAPAFLO) 4 MG CAPS capsule Take 4 mg by mouth daily with breakfast.       . Ticagrelor (BRILINTA) 90 MG TABS tablet Take 1 tablet (90 mg total) by mouth 2 (two) times daily.  60 tablet  6  . triamterene-hydrochlorothiazide (MAXZIDE-25) 37.5-25 MG per tablet Take 1 tablet by mouth daily.        [name]  DISCONTD: atorvastatin (LIPITOR) 20 MG tablet Take 20 mg by mouth daily.        [name] DISCONTD: Ticagrelor (BRILINTA) 90 MG TABS tablet Take by mouth 2 (two) times daily.        . benzonatate (TESSALON) 100 MG capsule Take 100 mg by mouth 3 (three) times daily as needed.        . Cholecalciferol (VITAMIN D3) 1000 UNITS CAPS Take by mouth daily.        [name] DISCONTD: predniSONE (DELTASONE) 20 MG tablet Take 20 mg by mouth daily.          No Known Allergies  Past Medical History  Diagnosis Date  . BENIGN PROSTATIC HYPERTROPHY, WITH OBSTRUCTION 05/28/2010  . COLONIC POLYPS, ADENOMATOUS, HX OF 05/28/2010  . GENERALIZED OSTEOARTHROSIS  UNSPECIFIED SITE 05/28/2010  . GERD 05/28/2010  . HYPERLIPIDEMIA 05/28/2010  . Intrinsic asthma, 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  . Malignant neoplasm of descending colon 05/28/2010  . MITRAL VALVE PROLAPSE 05/28/2010  . 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 PALPITATIONS, CHRONIC 05/28/2010  . SLEEP APNEA, OBSTRUCTIVE, MODERATE 05/28/2010  . UNSPECIFIED INFLAMMATORY AND TOXIC NEUROPATHY 05/28/2010  . VITAMIN D DEFICIENCY 05/28/2010    ROS: Negative except as per HPI  BP 128/83  Pulse 63  Ht 5\' 10"  (1.778 m)  Wt 237 lb (107.502 kg)  BMI 34.01 kg/m2  PHYSICAL EXAM: Pt is alert and oriented, overweight male in NAD HEENT: normal Neck: JVP - normal, carotids 2+= without bruits Lungs: CTA bilaterally CV: RRR without murmur or gallop Abd: soft, NT, Positive BS, no hepatomegaly Ext: no C/C/E, distal pulses intact and equal Skin: warm/dry no rash  EKG:  Normal sinus rhythm with first degree AV block, heart rate 63 beats per minute.  ASSESSMENT AND PLAN:

## 2011-06-22 NOTE — Assessment & Plan Note (Signed)
The patient is clinically stable. I don't think his left scapular pain is cardiac in origin but I'm not sure about this. He has known moderate left main stenosis and I think we should further assessment which will include pressure wire analysis at a minimum and I also made perform intravascular ultrasound of this region if the pressure wire study is indeterminate. Will reassess his left ventricular function at the time of cardiac catheter and also we'll relook at his right coronary artery. Risks and indication of the procedure was reviewed with the patient in detail and he agrees to proceed. If his left main stem disease proves to be hemodynamically significant, will consult with Dr. Cornelius Moras.

## 2011-06-22 NOTE — Patient Instructions (Signed)
Your physician recommends that you schedule a follow-up appointment in: 3 months with Dr. Excell Seltzer.  Your physician has requested that you have a cardiac catheterization. Cardiac catheterization is used to diagnose and/or treat various heart conditions. Doctors may recommend this procedure for a number of different reasons. The most common reason is to evaluate chest pain. Chest pain can be a symptom of coronary artery disease (CAD), and cardiac catheterization can show whether plaque is narrowing or blocking your heart's arteries. This procedure is also used to evaluate the valves, as well as measure the blood flow and oxygen levels in different parts of your heart. For further information please visit [email or web address] Please follow instruction sheet, as given. To be done June 25, 2011

## 2011-06-22 NOTE — Assessment & Plan Note (Signed)
The patient is on atorvastatin 20 mg with an LDL during his recent hospital stay of 115 mg per deciliter. Will recheck in a few months.

## 2011-06-23 LAB — BASIC METABOLIC PANEL
BUN: 13 mg/dL (ref 6–23)
CO2: 29 mEq/L (ref 19–32)
Calcium: 10 mg/dL (ref 8.4–10.5)
Chloride: 103 mEq/L (ref 96–112)
Creatinine, Ser: 0.8 mg/dL (ref 0.4–1.5)
GFR: 102.11 mL/min (ref >60.00)
Glucose, Bld: 81 mg/dL (ref 70–99)
Potassium: 3.4 mEq/L — ABNORMAL LOW (ref 3.5–5.1)
Sodium: 140 mEq/L (ref 135–145)

## 2011-06-23 LAB — CBC WITH DIFFERENTIAL/PLATELET
Basophils Absolute: 0 10*3/uL (ref 0.0–0.1)
Basophils Relative: 0.6 % (ref 0.0–3.0)
Eosinophils Absolute: 0.3 10*3/uL (ref 0.0–0.7)
Eosinophils Relative: 5.3 % — ABNORMAL HIGH (ref 0.0–5.0)
HCT: 41.8 % (ref 39.0–52.0)
Hemoglobin: 14.5 g/dL (ref 13.0–17.0)
Lymphocytes Relative: 15.6 % (ref 12.0–46.0)
Lymphs Abs: 0.9 10*3/uL (ref 0.7–4.0)
MCHC: 34.6 g/dL (ref 30.0–36.0)
MCV: 92.2 fl (ref 78.0–100.0)
Monocytes Absolute: 0.6 10*3/uL (ref 0.1–1.0)
Monocytes Relative: 9.4 % (ref 3.0–12.0)
Neutro Abs: 4.2 10*3/uL (ref 1.4–7.7)
Neutrophils Relative %: 69.1 % (ref 43.0–77.0)
Platelets: 205 10*3/uL (ref 150.0–400.0)
RBC: 4.53 Mil/uL (ref 4.22–5.81)
RDW: 13 % (ref 11.5–14.6)
WBC: 6.1 10*3/uL (ref 4.5–10.5)

## 2011-06-23 LAB — PROTIME-INR
INR: 1.2 ratio — ABNORMAL HIGH (ref 0.8–1.0)
Prothrombin Time: 12.8 s — ABNORMAL HIGH (ref 10.2–12.4)

## 2011-06-24 ENCOUNTER — Telehealth: Payer: Self-pay | Admitting: *Deleted

## 2011-06-24 NOTE — Telephone Encounter (Signed)
Called patient concerning 3.4 KCL level from 6/26. Discussed with DOD( Dr.Crenshaw.) He ordered KCL for tonight. Called to [facility] and spoke with pharm so this will be filled STAT.

## 2011-06-25 ENCOUNTER — Ambulatory Visit (HOSPITAL_COMMUNITY)
Admission: RE | Admit: 2011-06-25 | Discharge: 2011-06-25 | Disposition: A | Discharge status: Home / Self Care | Payer: Medicare Other | Source: Ambulatory Visit | Attending: Cardiovascular Disease | Admitting: Cardiovascular Disease

## 2011-06-25 DIAGNOSIS — I2510 Atherosclerotic heart disease of native coronary artery without angina pectoris: Secondary | ICD-10-CM | POA: Insufficient documentation

## 2011-06-25 DIAGNOSIS — Z9861 Coronary angioplasty status: Secondary | ICD-10-CM | POA: Insufficient documentation

## 2011-06-25 DIAGNOSIS — I252 Old myocardial infarction: Secondary | ICD-10-CM | POA: Insufficient documentation

## 2011-06-25 LAB — POCT ACTIVATED CLOTTING TIME: Activated Clotting Time: 270 seconds

## 2011-06-28 ENCOUNTER — Telehealth: Payer: Self-pay | Admitting: Cardiovascular Disease

## 2011-06-28 DIAGNOSIS — I214 Non-ST elevation (NSTEMI) myocardial infarction: Secondary | ICD-10-CM

## 2011-06-28 NOTE — Telephone Encounter (Signed)
Pt states he needs refill for brilinta and lipitor 90 days supply to be call in to [facility] # [phone]

## 2011-06-29 MED ORDER — TICAGRELOR 90 MG PO TABS: 90.0000 mg | ORAL_TABLET | Freq: Two times a day (BID) | ORAL | Status: DC

## 2011-06-29 MED ORDER — ATORVASTATIN CALCIUM 20 MG PO TABS: 20.0000 mg | ORAL_TABLET | Freq: Every day | ORAL | Status: DC

## 2011-07-01 ENCOUNTER — Telehealth: Payer: Self-pay | Admitting: Cardiovascular Disease

## 2011-07-01 ENCOUNTER — Encounter: Payer: Self-pay | Admitting: Internal Medicine

## 2011-07-01 NOTE — Telephone Encounter (Signed)
Per cardiac rehab pts referral form has finally been completed they will contact him soon to sch, pt is aware

## 2011-07-01 NOTE — Telephone Encounter (Signed)
Pt wants to know when does he start his rehab. Pt would like to talk to a nurse.

## 2011-07-12 ENCOUNTER — Encounter (HOSPITAL_COMMUNITY): Payer: Medicare Other | Attending: Cardiovascular Disease

## 2011-07-12 DIAGNOSIS — I2582 Chronic total occlusion of coronary artery: Secondary | ICD-10-CM | POA: Insufficient documentation

## 2011-07-12 DIAGNOSIS — Z79899 Other long term (current) drug therapy: Secondary | ICD-10-CM | POA: Insufficient documentation

## 2011-07-12 DIAGNOSIS — K219 Gastro-esophageal reflux disease without esophagitis: Secondary | ICD-10-CM | POA: Insufficient documentation

## 2011-07-12 DIAGNOSIS — I10 Essential (primary) hypertension: Secondary | ICD-10-CM | POA: Insufficient documentation

## 2011-07-12 DIAGNOSIS — I498 Other specified cardiac arrhythmias: Secondary | ICD-10-CM | POA: Insufficient documentation

## 2011-07-12 DIAGNOSIS — I252 Old myocardial infarction: Secondary | ICD-10-CM | POA: Insufficient documentation

## 2011-07-12 DIAGNOSIS — Z8249 Family history of ischemic heart disease and other diseases of the circulatory system: Secondary | ICD-10-CM | POA: Insufficient documentation

## 2011-07-12 DIAGNOSIS — Z9861 Coronary angioplasty status: Secondary | ICD-10-CM | POA: Insufficient documentation

## 2011-07-12 DIAGNOSIS — D869 Sarcoidosis, unspecified: Secondary | ICD-10-CM | POA: Insufficient documentation

## 2011-07-12 DIAGNOSIS — I2510 Atherosclerotic heart disease of native coronary artery without angina pectoris: Secondary | ICD-10-CM | POA: Insufficient documentation

## 2011-07-12 DIAGNOSIS — Z5189 Encounter for other specified aftercare: Secondary | ICD-10-CM | POA: Insufficient documentation

## 2011-07-12 DIAGNOSIS — I440 Atrioventricular block, first degree: Secondary | ICD-10-CM | POA: Insufficient documentation

## 2011-07-12 DIAGNOSIS — E785 Hyperlipidemia, unspecified: Secondary | ICD-10-CM | POA: Insufficient documentation

## 2011-07-12 DIAGNOSIS — Z7982 Long term (current) use of aspirin: Secondary | ICD-10-CM | POA: Insufficient documentation

## 2011-07-12 DIAGNOSIS — I2589 Other forms of chronic ischemic heart disease: Secondary | ICD-10-CM | POA: Insufficient documentation

## 2011-07-14 ENCOUNTER — Encounter (HOSPITAL_COMMUNITY): Payer: Medicare Other

## 2011-07-15 NOTE — Cardiovascular Report (Signed)
NAME:  Derrick, [name] NO.:  [id number]  MEDICAL RECORD NO.:  [id number]  LOCATION:                                 FACILITY:  PHYSICIAN:  [name], MD  DATE OF BIRTH:  1941-03-26  DATE OF PROCEDURE:  06/03/2011 DATE OF DISCHARGE:                           CARDIAC CATHETERIZATION   PROCEDURES: 1. Left heart catheterization. 2. Selective coronary angiography. 3. Left ventriculography. 4. Percutaneous transluminal coronary angioplasty and stenting of the     right coronary artery.  PROCEDURAL INDICATIONS:  [name] is a 70 year old gentleman with multiple medical problems who presented with a non-ST-elevation infarction.  He was referred for cardiac catheterization.  He has no past history of cardiac disease.  The patient was brought to the cardiac cath lab, and he developed severe chest pain when we were putting him onto the table.  We moved fairly quickly at this point as we expected an unstable coronary syndrome.  The right wrist was prepped, draped, and anesthetized with 1% lidocaine.  A 5-French sheath was introduced into the right radial artery without difficulty.  The patient was given intravenous heparin and verapamil through the sheath.  Standard Judkins catheters were used for coronary angiography and left ventriculography.  After the diagnostic procedure, I moved quickly to PCI.  The sheath was upsized to a 6-French.  There was total occlusion of the mid right coronary artery.  A JR-4 guide catheter was introduced and a cougar wire was advanced beyond the occlusion.  Heparin and Integrilin were used for the interventional procedure.  The vessel was predilated with a 2.5 x 15 mm apex balloon.  Dr. Cornelius Moras from cardiac surgery was called to review the case as the patient had moderately tight distal left main disease. After review with Dr. Cornelius Moras, we agreed that proceeding with primary PCI was the best way to move forward and then we can deal  with his residual coronary artery disease electively.  A 3.0 x 30 mm coronary balloon was then inflated to 10 atmospheres and there was severe diffuse disease throughout the mid RCA.  A 3.5 x 33 Xience Prime drug-eluting stent was carefully positioned, but did not appear long enough to cover the lesion.  A 3.5 x 38 mm Xience Prime drug-eluting stent was then positioned and it provided full lesion coverage, and the stent was deployed at 14 atmospheres.  It was then postdilated with a 3.75 x 20 mm [place] Quantum apex balloon that was taken to 16 atmospheres.  There was an excellent angiographic result with 0% residual stenosis.  The patient tolerated the procedure well.  A TR band was used for radial hemostasis. The patient was treated with Brilinta for oral antiplatelet therapy, and he was given 180 mg.  PROCEDURAL FINDINGS:  There is no transaortic valve gradient.  CORONARY ANGIOGRAPHY:  The RCA is totally occluded.  There is diffuse proximal disease leading into a total occlusion in the mid RCA.  Left Main:  The left main is a long segment.  It divides into the LAD and left circumflex.  There is an eccentric 70% distal left main plaque. This is seen best in the RAO caudal view.  The left main divides into the LAD and left circumflex.  LAD:  The LAD is tortuous in its proximal aspect.  There is moderate calcification.  It reaches the left ventricular apex and the vessel was diffusely diseased.  The LAD gives off at least three diagonal branches. The proximal LAD has scattered 50% stenoses.  Left circumflex:  The circumflex is also tortuous.  There are three major OM branches supplied by the circumflex.  All are patent with mild diffuse disease.  LEFT VENTRICULOGRAPHY:  There is akinesis of the basal and mid inferior wall.  The apical inferior wall contracts well and the anterior wall contracts well.  The ejection fraction is estimated at 50%.  FINAL ASSESSMENT: 1. Total occlusion of  the right coronary artery treated successfully     with a single long drug-eluting stent. 2. Moderately tight distal left mainstem stenosis. 3. Moderate diffuse left anterior descending stenosis. 4. Mild diffuse left circumflex stenosis. 5. Mild segmental left ventricular dysfunction.  RECOMMENDATIONS:  The patient will be treated with aggressive post MI medical therapy.  We will likely proceed with intravascular ultrasound or pressure wire analysis of his left main in a staged manner after he recovers from his myocardial infarction.     [name], MD     MDC/MEDQ  D:  06/23/2011  T:  06/24/2011  Job:  [id number]  cc:   [name], MD Salvatore Decent. Cornelius Moras, M.D.  Electronically Signed by Tonny Bollman MD on 07/15/2011 12:13:34 AM

## 2011-07-15 NOTE — Cardiovascular Report (Signed)
NAME:  Derrick [name], Derrick [name] NO.:  [id number]  MEDICAL RECORD NO.:  [id number]  LOCATION:  6522                         FACILITY:  [facility]  PHYSICIAN:  [name], MD  DATE OF BIRTH:  02/20/1941  DATE OF PROCEDURE: DATE OF DISCHARGE:  06/25/2011                           CARDIAC CATHETERIZATION   PROCEDURE: 1. Left heart catheterization. 2. Selective coronary angiography. 3. Left ventricular angiography. 4. Pressure wire analysis of the left main.  PROCEDURAL INDICATIONS:  Derrick [name] is a 70 year old gentleman who had a recent myocardial infarction.  He underwent stenting of the right coronary artery.  At the time of his initial catheterization,  he was found to have moderately tight distal left main stenosis and he returned today for pressure wire analysis of the left main.  Risks and indications of the procedure were reviewed with the patient and informed consent was obtained.  The right wrist was prepped, draped and anesthetized with 1% lidocaine.  Using modified Seldinger technique, a 6-French sheath was placed in the right radial artery.  Standard Judkins catheters were used for left ventriculography and right coronary angiography.  An XB LAD 3.5-cm guide catheter was used for left coronary angiography.  Heparin was used for anticoagulation and 7000 units of heparin were administered at the time of arterial access.  Verapamil 3 mg was administered through the sheath.  Pressure wire analysis of the left main was performed utilizing intravenous adenosine per protocol. The patient tolerated the procedure well.  There were no immediate complications.  PROCEDURAL FINDINGS:  Aortic pressure is 138/72 with a mean of 101, left ventricular pressure is 136/20.  Left ventriculography shows normal LV function.  The inferior wall now contracts normally and it is improved from the previous study.  The ejection fraction is estimated at 55-60%.  Coronary  angiography:  The right coronary artery stent is widely patent. There is mild stenosis on the proximal edge of the stent in the ostium of the RCA.  The distal vessel is diffusely diseased.  The PDA has a high-grade distal stenosis of about 80%.  The posterolateral branch is patent.  Left mainstem:  The left main is a long segment.  It is patent with a 50- 60% distal left main stenosis.  It then divides into the LAD and left circumflex.  LAD:  The LAD is heavily calcified and it is diffusely diseased throughout the proximal and midportions.  There are scattered 40-50% lesions.  There is no high-grade stenosis present.  The first diagonal has diffuse disease with up to 50% stenosis.  Left circumflex:  The left circumflex is tortuous.  There is a large intermediate branch without significant stenosis.  The proximal circumflex has a 50% stenosis at a trifurcation point leading into 2 OM branches and then the AV groove circumflex courses down and gives off a left posterolateral branch.  All of the patient's coronary anatomy is stable from his previous study.  Left main FFR was performed utilizing intravenous adenosine per protocol.  The FFR at peak hyperemia was 0.87.  ASSESSMENT: 1. Moderate distal left main disease with a negative pressure wire     analysis (FFR equals 0.87). 2. Patency of  the right coronary artery stent. 3. Moderate diffuse left anterior descending and left circumflex     stenoses. 4. Normal left ventricular function.  RECOMMENDATIONS:  The patient will be continued with medical therapy. With a negative FFR greater than 0.8, I would not recommend revascularization and he should do well with ongoing aggressive medical therapy.     [name], MD     MDC/MEDQ  D:  06/25/2011  T:  06/26/2011  Job:  [id number]  cc:   [name], MD Salvatore Decent. Cornelius Moras, M.D.  Electronically Signed by Tonny Bollman MD on 07/15/2011 12:13:44 AM

## 2011-07-16 ENCOUNTER — Encounter (HOSPITAL_COMMUNITY): Payer: Medicare Other

## 2011-07-19 ENCOUNTER — Encounter (HOSPITAL_COMMUNITY): Payer: Medicare Other

## 2011-07-21 ENCOUNTER — Encounter (HOSPITAL_COMMUNITY): Payer: Medicare Other

## 2011-07-23 ENCOUNTER — Telehealth: Payer: Self-pay | Admitting: Cardiovascular Disease

## 2011-07-23 ENCOUNTER — Encounter (HOSPITAL_COMMUNITY): Payer: Medicare Other

## 2011-07-23 NOTE — Telephone Encounter (Signed)
Pt was told by cardia rehab to call and make appt. Earlier then Sept. Due to chest/back pain.  No open appts. Was suggested Scott to see but pt wants Dr. Excell Seltzer to see.

## 2011-07-23 NOTE — Telephone Encounter (Signed)
Spoke with pt who reports he has been having pain in back between shoulder blades for last couple of weeks. States he also has arthritis in spine and not sure if pain is from this.  Cardiac rehab suggested he make appointment with Dr. Excell Seltzer sooner than scheduled appt. In September. Pt states he is able to exercise at cardiac rehab. States sometimes NTG will relieve pain but not always. Pain usually goes away on own.  Appt. Made for pt to see Tereso Newcomer, PA on July 27, 2011 at 3:30. Pt instructed if symptoms were to worsen before then he should go to ED to be evaluated.

## 2011-07-26 ENCOUNTER — Encounter (HOSPITAL_COMMUNITY): Payer: Medicare Other

## 2011-07-26 NOTE — H&P (Signed)
NAME:  ANEESH, FALLER NO.:  [id number]  MEDICAL RECORD NO.:  [id number]  LOCATION:  1431                         FACILITY:  [facility]  PHYSICIAN:  Colleen Can. Deborah Chalk, M.D.DATE OF BIRTH:  [birth year]  DATE OF ADMISSION:  06/01/2011 DATE OF DISCHARGE:  06/02/2011                             HISTORY & PHYSICAL   As part of a transfer from [facility] to [facility], seen in consultation at [facility] and facilitating admission H and P to [facility].  Mr. Nafziger is a 70 year old white male with the onset of midscapular chest discomfort and uneasy feeling in his chest while sitting on a couch last night.  This began approximately 8:30 p.m. and lasted while in the hospital until approximately 3:00 a.m.  He felt that his flanks were somewhat hurting and burning, and he radiation to both arms and hands. It was gradually relieved and he had recurrence of milder discomfort this afternoon.  He did have positive cardiac enzymes later today.  He is being transferred to [facility] in anticipation of plans for cardiac catheterization tomorrow.  PAST MEDICAL HISTORY:  He has had hypertension since age 57.  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  He has had increase in IgM proteins.  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 He has received 2 courses of Rituximab with last dose being April 15, 2011.  These were 4-week courses.  He has had a history of sarcoid.  He has had a history of gastroesophageal reflux.  He had a history of arrhythmias, which sounds like PVCs dating back to the 1980s.  PREVIOUS SURGERIES:  Include an appendectomy as a child.  He had a colon resection in 2000 for colon cancer.  He has had lung  wedge biopsy leading to diagnosis of sarcoid.  He has a history of arthroscopy.  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 The patient has a masters in [employer] and also has a degree in Counselling psychologist.  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  He has had no cigarette use.  He has social alcohol.  He is married, and has two children.  FAMILY HISTORY:  Positive for coronary artery disease.  His father died at age 46.  His father had a CVA at age 51.  His mother died at age 28 of congestive heart failure.  One brother died of testicular cancer. One brother died of myocardial infarction at age 50 after longstanding disability.  One of the brother had myocardial infarction at age 60, but now is aged 79.  REVIEW OF SYSTEMS:  As noted.  He remains reasonably active in spite of his multiple health issues and they have been well maintained.  He does have lower extremity weakness.  PHYSICAL EXAMINATION:  GENERAL:  He is a pleasant white male in no acute distress.  His weight is an issue. VITAL SIGNS:  His blood pressure is 135/78  and heart rate is 60, respiratory rate 18. HEENT:  Negative. LUNGS:  Clear. HEART:  Showed regular rate and rhythm. ABDOMEN:  Soft, nontender. EXTREMITIES:  Without edema.  EKG showed poor R-wave progression anteriorly, but no ST or T-wave changes.  PERTINENT LABS:  Show CK-MB peaking at 19.8.  Troponin was peaking at 2.71.  Creatinine was 0.8 with BUN of 15 and normal electrolytes. Hematocrit was 45.  HDL is 37 with an LDL of 115, and total cholesterol of 197.  OVERALL IMPRESSION: 1. Non-ST elevation myocardial infarction. 2. Hypertension. 3. Chronic inflammatory demyelinating polyneuropathy. 4. Questionable lymphomas/myeloma. 5. Gastroesophageal reflux.  PLAN:  We will transfer to [facility] with plans for cardiac catheterization tomorrow.  Procedure risks and benefits have been explained.   We will start IV heparin and IV nitroglycerin now.     Colleen Can. Deborah Chalk, M.D.   ______________________________ Colleen Can. Deborah Chalk, M.D.    SNT/MEDQ  D:  06/02/2011  T:  06/03/2011  Job:  [id number]  Electronically Signed by Roger Shelter M.D. on 07/26/2011 11:16:36 AM

## 2011-07-26 NOTE — Discharge Summary (Signed)
NAME:  Derrick [name], Derrick [name] NO.:  [id number]  MEDICAL RECORD NO.:  [id number]  LOCATION:  2924                         FACILITY:  [facility]  PHYSICIAN:  Colleen Can. Deborah Chalk, M.D.DATE OF BIRTH:  Sep 20, 1941  DATE OF ADMISSION:  06/02/2011 DATE OF DISCHARGE:  06/05/2011                              DISCHARGE SUMMARY   PRIMARY CARDIOLOGIST:  [name], MD  DISCHARGE DIAGNOSIS:  Non-ST-segment elevation myocardial infarction.  SECONDARY DIAGNOSES: 1. Coronary artery disease status post drug-eluting stent placement to     the right coronary artery this admission. 2. Ischemic cardiomyopathy, EF 45-50%. 3. 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 4. Hypertension. 5. 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 6. ? Lymphoma/myeloma with elevated IgM antigen protein. 7. History of sarcoidosis. 8. Gastroesophageal reflux disease. 9. History of premature ventricular contractions. 10.Hyperlipidemia. 11.Status post appendectomy. 12.History of colon cancer status post resection in 2000. 13.History of lung wedge biopsy. 14.Status post arthroscopy.  ALLERGIES:  No known drug allergies.  PROCEDURES: 1. A 2D echocardiogram on June 02, 2011, EF of 55-60%, normal wall     motion.  No regional wall motion abnormalities.  Mildly to     moderately calcified annulus of the aortic valve with mild     stenosis. 2. Left heart cardiac catheterization on June 03, 2011, revealing an     occlusion of the proximal right coronary artery with 70% stenosis     in the distal left main, otherwise nonobstructive disease     throughout the left coronary tree.  EF was 45-50% with inferior     akinesis.  Right coronary artery was successfully stented with 3.5     x 38 mm Xience Prime drug-eluting stent. 3. On June 02, 2011, CT angio of the chest with contrast showing no     evidence of PE.  HISTORY OF PRESENT ILLNESS:  A 70 year old male with the  above problem list who was in his usual state of health until June 01, 2011, when he was sitting and had sudden onset of chest discomfort approximately 8:30 p.m.  He presented to the [facility] ED at 3 a.m. where he also complained of radiation to both arms and hands.  Symptoms gradually resolved and following admission, the patient was noted to have elevated cardiac markers, eventually Cardiology was consulted.  The patient peaked his CK of 309 with an MB of 19.6 and troponin I at 2.71. Recommendation was made for transfer to [facility] under Cardiology care with catheterization.  HOSPITAL COURSE:  The patient arrived at the [facility] on the evening of June 02, 2011, and the following morning, he underwent diagnostic catheterization, revealing occlusion of the proximal RCA.  The patient also had 70% distal left main stenosis and CT Surgery was consulted while the patient was still on the stable.  CT Surgery recommended definitive PCI of the right coronary artery with recommendation for followup catheterization with fractional flow reserve of the left main in approximately 4 weeks to determine whether or not the patient would require bypass surgery.  At that point, the patient will discuss for PCI and stenting of right coronary  artery with placement 3.5 x 38 mm Xience Prime drug-eluting stent.  He tolerated this procedure well and postprocedure, he has been ambulating without recurrent symptoms or limitations.  He has been seen by Cardiac Rehab and outpatient referral has been made.  Of note, beta-blocker has not been initiated secondary to bradycardia with rates in the 40s to 50s.  The patient will be discharged home today in good condition.  DISCHARGE LABS:  Hemoglobin 14.3, hematocrit 41.0, WBC 7.8, platelets 161.  INR 1.15.  D-dimer 0.59.  Sodium 139, potassium 4.2, chloride 101, CO2 29, BUN 14, creatinine 0.85, glucose 99.  Total bilirubin 0.5, alkaline phosphatase 50, AST 30, ALT  24, total protein 6.5, albumin 3.6, calcium 9.8.  CK 147, MB 5.8, troponin I 1.02.  Total cholesterol 197, triglycerides 224, HDL 37, LDL 115.  MRSA screen was negative.  DISPOSITION:  The patient will be discharged home today in good condition.  FOLLOWUP PLANS AND APPOINTMENTS:  The patient will follow up with Dr. Excell Seltzer in approximately 2 weeks, at which point arrangements will be made for relook catheterization and fractional flow reserve measurement of the left main.  DISCHARGE MEDICATIONS: 1. Lipitor 20 mg at bedtime. 2. Nitroglycerin 0.4 mg sublingual p.r.n. chest pain. 3. Ticagrelor 90 mg b.i.d. 4. Aspirin 81 mg daily. 5. Gabapentin 800 mg t.i.d. 6. Multivitamin 1 tab daily. 7. Norvasc 10 mg daily. 8. Prevacid 30 mg daily. 9. Pulmicort inhaler 1 puff daily. 10.Rapaflo 4 mg daily. 11.Triamterene/hydrochlorothiazide 37.5/25 one tab daily. 12.Vitamin D3 5000 units daily.  OUTSTANDING LAB STUDIES:  Followup FFR in approximately 4 weeks to be scheduled by Dr. Excell Seltzer.  The patient will also require followup lipids and LFTs in 6-8 weeks given new statin therapy.  DURATION OF DISCHARGE ENCOUNTER:  45 minutes including physician time.     Nicolasa Ducking, ANP   ______________________________ Colleen Can. Deborah Chalk, M.D.    CB/MEDQ  D:  06/05/2011  T:  06/06/2011  Job:  [id number]  cc:   [name], MD  Electronically Signed by Nicolasa Ducking ANP on 06/09/2011 04:25:15 PM Electronically Signed by Roger Shelter M.D. on 07/26/2011 11:16:34 AM

## 2011-07-27 ENCOUNTER — Encounter: Payer: Self-pay | Admitting: Physician Assistant

## 2011-07-27 ENCOUNTER — Ambulatory Visit (INDEPENDENT_AMBULATORY_CARE_PROVIDER_SITE_OTHER): Payer: Medicare Other | Admitting: Physician Assistant

## 2011-07-27 VITALS — BP 140/90 | HR 59 | Resp 18 | Ht 70.0 in | Wt 229.1 lb

## 2011-07-27 DIAGNOSIS — M546 Pain in thoracic spine: Secondary | ICD-10-CM

## 2011-07-27 DIAGNOSIS — I2510 Atherosclerotic heart disease of native coronary artery without angina pectoris: Secondary | ICD-10-CM

## 2011-07-27 MED ORDER — ISOSORBIDE MONONITRATE 30 MG PO TB24: 30.0000 mg | ORAL_TABLET | ORAL | Status: DC

## 2011-07-27 NOTE — Assessment & Plan Note (Signed)
Add Imdur 30 mg QD as noted and proceed with ETT.  Follow up with Dr. Excell Seltzer next month.

## 2011-07-27 NOTE — Assessment & Plan Note (Signed)
His symptoms are likely noncardiac.  He certainly has several reasons to have back pain.  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  However, he did have thoracic back pain at the time of his myocardial infarction.  He symptoms have been fairly stable since that time.  He had recent relook cardiac catheterization with Dr. Excell Seltzer with FFR that was negative for significant obstruction across the left main.  I reviewed his films today with Dr. Clifton James.  He did not feel that a nuclear study would provide much benefit at this time.  However, we could proceed with a routine exercise treadmill test.  If this is negative, the patient can continue with cardiac rehabilitation.  I will also place him on low-dose isosorbide 30 mg a day.  He can keep his followup with Dr. Excell Seltzer as scheduled in September.   If his ETT is negative and the nitrates provide no relief, I would suggest he pursue further evaluation and management with his hematologist and, possibly a rheumatologist due to his back issues.

## 2011-07-27 NOTE — Progress Notes (Signed)
History of Present Illness: Primary Cardiologist: Dr. Tonny Bollman  Derrick [name] is a 70 y.o. male who presents for back pain.  He has a history of CAD, status post NSTEMI 06/01/11 treated with a drug-eluting stent to the RCA.  He also had residual left main stenosis.  It was felt that this should be studied with FFR.  Relook cardiac catheterization was done 06/25/11 and demonstrated dLM 50-60% (FFR 0.87), prox to mid LAD 40-50%, D1 50%, pCFX 50%, RCA stent ok, dPDA 80%, EF 55-60%.  His FFR was felt to be negative and therefore medical therapy was recommended.  Last echo 6/12: EF 55-60% with mild aortic stenosis.  His other medical history includes hypertension, hyperlipidemia, colon cancer status post resection in 2000, chronic idiopathic demyelinating polyneuropathy, Monoclonal gammopathy of unknown significance, pulmonary sarcoid and GERD.  He is referred from cardiac rehabilitation due to back pain.  His pain with his MI was interscapular back pain that was a little left of center.  He points to the level of about T6.  Of note, he has had this pain for about 2 years.  But, when he had his MI on the cath table, this pain was much more intense and he had associated bilateral arm pain.  He has not noted a change in his back pain since his PCI.  He has not had the same intense back pain like he had with his MI.  He has been going to cardiac rehab.  He noted his symptoms to them and they asked he follow up before continuing his rehab.  He typically gets pain at rest.  It will wake him up.  He will note it in the morning.  It is worse when he is tired.  He notes that his symptoms improve with exertion.  No DOE.  No syncope.  No orthopnea, PND or edema.  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  He has evidence of thoracic spine ankylosing spondylitis.    Past Medical History  Diagnosis Date  . BENIGN PROSTATIC HYPERTROPHY, WITH OBSTRUCTION  05/28/2010  . COLONIC POLYPS, ADENOMATOUS, HX OF 05/28/2010  . GENERALIZED OSTEOARTHROSIS UNSPECIFIED SITE 05/28/2010  . GERD 05/28/2010  . HYPERLIPIDEMIA 05/28/2010  . Intrinsic asthma, 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  . Malignant neoplasm of descending colon 05/28/2010  . MITRAL VALVE PROLAPSE 05/28/2010  . 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 PALPITATIONS, CHRONIC 05/28/2010  . SLEEP APNEA, OBSTRUCTIVE, MODERATE 05/28/2010  . UNSPECIFIED INFLAMMATORY AND TOXIC NEUROPATHY 05/28/2010  . VITAMIN D DEFICIENCY 05/28/2010  . CAD (coronary artery disease)     a. s/p NSTEMI 06/01/11: DES to RCA;  b. cath 06/25/11:   dLM 50-60% (FFR 0.87), prox to mid LAD 40-50%, D1 50%, pCFX 50%, RCA stent ok, dPDA 80%, EF 55-60%.  His FFR was felt to be negative and therefore medical therapy was recommended ;  echo 6/12: EF 55-60%, mild AS     Current Outpatient Prescriptions  Medication Sig Dispense Refill  . albuterol (PROVENTIL HFA) 108 (90 BASE) MCG/ACT inhaler Inhale 2 puffs into the lungs every 6 (six) hours as needed.        [name] amLODipine (NORVASC) 10 MG tablet Take 10 mg by mouth daily.        [name] aspirin 81 MG tablet Take 81 mg by mouth daily.        [name] atorvastatin (LIPITOR) 20 MG tablet Take 1 tablet (20 mg total) by mouth daily.  90  tablet  3  . benzonatate (TESSALON) 100 MG capsule Take 100 mg by mouth 3 (three) times daily as needed.        . Budesonide (PULMICORT FLEXHALER) 90 MCG/ACT inhaler Inhale 2 puffs into the lungs 2 (two) times daily.        . Cholecalciferol (VITAMIN D-3) 5000 UNITS TABS Take 1 tablet by mouth daily.        . fluticasone (VERAMYST) 27.5 MCG/SPRAY nasal spray Place 1 spray into the nose 2 (two) times daily.        [name] gabapentin (NEURONTIN) 800 MG tablet Take 800 mg by mouth 3 (three) times daily.        . lansoprazole (PREVACID) 30 MG capsule Take 30 mg by mouth daily.        . Multiple Vitamin (MULTIVITAMIN) tablet Take 1 tablet by mouth daily.        . nitroGLYCERIN (NITROSTAT) 0.4 MG SL tablet Place 0.4 mg  under the tongue every 5 (five) minutes as needed.        . silodosin (RAPAFLO) 4 MG CAPS capsule Take 8 mg by mouth daily with breakfast.       . Ticagrelor (BRILINTA) 90 MG TABS tablet Take 1 tablet (90 mg total) by mouth 2 (two) times daily.  180 tablet  3  . triamterene-hydrochlorothiazide (MAXZIDE-25) 37.5-25 MG per tablet Take 1 tablet by mouth daily.        . isosorbide mononitrate (IMDUR) 30 MG CR tablet Take 1 tablet (30 mg total) by mouth every morning.  30 tablet  6    Allergies: No Known Allergies  Social history:  Nonsmoker  ROS:  Please see the history of present illness.  All other systems reviewed and negative.   Vital Signs: BP 140/90  Pulse 59  Resp 18  Ht 5\' 10"  (1.778 m)  Wt 229 lb 1.9 oz (103.928 kg)  BMI 32.88 kg/m2  PHYSICAL EXAM: Well nourished, well developed, in no acute distress HEENT: normal Neck: no JVD Cardiac:  normal S1, S2; RRR; no murmur Lungs:  clear to auscultation bilaterally, no wheezing, rhonchi or rales Abd: soft, nontender, no hepatomegaly Ext: no edema Musculoskeletal: No spinal tenderness to palpation Skin: warm and dry Neuro:  CNs 2-12 intact, no focal abnormalities noted  EKG:  Sinus rhythm, heart rate 60, normal axis, first degree AV block with a PR interval of 262 ms, no ischemic changes  ASSESSMENT AND PLAN:

## 2011-07-27 NOTE — Patient Instructions (Addendum)
Your physician recommends that you schedule a follow-up appointment in: 09/09/11 @ 10:30 to see Dr. Excell Seltzer  Your physician has requested that you have an exercise tolerance test DX CAD THIS IS ON 8/312 @ 10:15. For further information please visit [email or web address] Please also follow instruction sheet, as given.

## 2011-07-28 ENCOUNTER — Encounter (HOSPITAL_BASED_OUTPATIENT_CLINIC_OR_DEPARTMENT_OTHER): Payer: Medicare Other | Admitting: Oncology

## 2011-07-28 ENCOUNTER — Encounter (HOSPITAL_COMMUNITY): Payer: Medicare Other

## 2011-07-28 ENCOUNTER — Other Ambulatory Visit: Payer: Self-pay | Admitting: Oncology

## 2011-07-28 DIAGNOSIS — D472 Monoclonal gammopathy: Secondary | ICD-10-CM

## 2011-07-28 LAB — CBC WITH DIFFERENTIAL/PLATELET
BASO%: 1.3 % (ref 0.0–2.0)
Basophils Absolute: 0 10*3/uL (ref 0.0–0.1)
EOS%: 7.1 % — ABNORMAL HIGH (ref 0.0–7.0)
Eosinophils Absolute: 0.2 10*3/uL (ref 0.0–0.5)
HCT: 41.5 % (ref 38.4–49.9)
HGB: 14.5 g/dL (ref 13.0–17.1)
LYMPH%: 47.5 % (ref 14.0–49.0)
MCH: 29.9 pg (ref 27.2–33.4)
MCHC: 34.9 g/dL (ref 32.0–36.0)
MCV: 85.6 fL (ref 79.3–98.0)
MONO#: 0.8 10*3/uL (ref 0.1–0.9)
MONO%: 34.9 % — ABNORMAL HIGH (ref 0.0–14.0)
NEUT#: 0.2 10*3/uL — CL (ref 1.5–6.5)
NEUT%: 9.2 % — ABNORMAL LOW (ref 39.0–75.0)
Platelets: 209 10*3/uL (ref 140–400)
RBC: 4.85 10*6/uL (ref 4.20–5.82)
RDW: 13 % (ref 11.0–14.6)
WBC: 2.4 10*3/uL — ABNORMAL LOW (ref 4.0–10.3)
lymph#: 1.1 10*3/uL (ref 0.9–3.3)

## 2011-07-28 LAB — MORPHOLOGY
PLT EST: ADEQUATE
RBC Comments: NORMAL

## 2011-07-28 LAB — CHCC SMEAR

## 2011-07-29 ENCOUNTER — Encounter: Payer: Self-pay | Admitting: Physician Assistant

## 2011-07-30 ENCOUNTER — Encounter (HOSPITAL_COMMUNITY): Payer: Medicare Other

## 2011-07-30 ENCOUNTER — Ambulatory Visit (INDEPENDENT_AMBULATORY_CARE_PROVIDER_SITE_OTHER): Payer: Medicare Other | Admitting: Physician Assistant

## 2011-07-30 ENCOUNTER — Encounter: Payer: Medicare Other | Admitting: Physician Assistant

## 2011-07-30 ENCOUNTER — Other Ambulatory Visit: Payer: Self-pay | Admitting: Oncology

## 2011-07-30 DIAGNOSIS — R079 Chest pain, unspecified: Secondary | ICD-10-CM

## 2011-07-30 LAB — IMMUNOFIXATION ELECTROPHORESIS
IgA: 160 mg/dL (ref 68–379)
IgG (Immunoglobin G), Serum: 863 mg/dL (ref 650–1600)
IgM, Serum: 328 mg/dL — ABNORMAL HIGH (ref 41–251)
Total Protein, Serum Electrophoresis: 7.2 g/dL (ref 6.0–8.3)

## 2011-07-30 LAB — COMPREHENSIVE METABOLIC PANEL
ALT: 28 U/L (ref 0–53)
AST: 24 U/L (ref 0–37)
Albumin: 4.7 g/dL (ref 3.5–5.2)
Alkaline Phosphatase: 67 U/L (ref 39–117)
BUN: 12 mg/dL (ref 6–23)
CO2: 29 mEq/L (ref 19–32)
Calcium: 10.2 mg/dL (ref 8.4–10.5)
Chloride: 101 mEq/L (ref 96–112)
Creatinine, Ser: 0.87 mg/dL (ref 0.50–1.35)
Glucose, Bld: 111 mg/dL — ABNORMAL HIGH (ref 70–99)
Potassium: 3.7 mEq/L (ref 3.5–5.3)
Sodium: 139 mEq/L (ref 135–145)
Total Bilirubin: 0.7 mg/dL (ref 0.3–1.2)
Total Protein: 7.2 g/dL (ref 6.0–8.3)

## 2011-07-30 LAB — KAPPA/LAMBDA LIGHT CHAINS
Kappa free light chain: 1.04 mg/dL (ref 0.33–1.94)
Kappa:Lambda Ratio: 0.95 (ref 0.26–1.65)
Lambda Free Lght Chn: 1.09 mg/dL (ref 0.57–2.63)

## 2011-07-30 LAB — LACTATE DEHYDROGENASE: LDH: 175 U/L (ref 94–250)

## 2011-07-30 NOTE — Progress Notes (Signed)
Exercise Treadmill Test  Pre-Exercise Testing Evaluation Rhythm: normal sinus  Rate: 61   PR:  .25 QRS:  .07  QT:  .37 QTc: .38     Test  Exercise Tolerance Test Ordering MD: Tereso Newcomer, PA-C  Interpreting MD:  Tereso Newcomer PA-C  Unique Test No: 1  Treadmill:  1  Indication for ETT: chest pain - rule out ischemia  Contraindication to ETT: No   Stress Modality: exercise - treadmill  Cardiac Imaging Performed: non   Protocol: standard Bruce - maximal  Max BP:  183/58  Max MPHR (bpm):  152 85% MPR (bpm):  129  MPHR obtained (bpm): 141 % MPHR obtained: 92  Reached 85% MPHR (min:sec): 6:00 Total Exercise Time (min-sec):  7:00  Workload in METS:  8.6 Borg Scale: 15  Reason ETT Terminated:  desired heart rate attained    ST Segment Analysis At Rest: normal ST segments - no evidence of significant ST depression With Exercise: non-specific ST changes  Other Information Arrhythmia:  No Angina during ETT:  absent (0) Quality of ETT:  diagnostic  ETT Interpretation:  normal - no evidence of ischemia by ST analysis  Comments: Fair exercise tolerance. Normal BP response to exercise. No chest pain. No back pain. Nonspecific ST changes at peak exercise, especially in lateral leads.  Non-diagnostic for ischemia.  Recommendations: Ok to return to cardiac rehab. Will leave stress ECGs for Dr. Excell Seltzer to review as well.

## 2011-08-02 ENCOUNTER — Encounter (HOSPITAL_COMMUNITY): Payer: Medicare Other | Attending: Cardiovascular Disease

## 2011-08-02 DIAGNOSIS — Z5189 Encounter for other specified aftercare: Secondary | ICD-10-CM | POA: Insufficient documentation

## 2011-08-02 DIAGNOSIS — Z79899 Other long term (current) drug therapy: Secondary | ICD-10-CM | POA: Insufficient documentation

## 2011-08-02 DIAGNOSIS — I10 Essential (primary) hypertension: Secondary | ICD-10-CM | POA: Insufficient documentation

## 2011-08-02 DIAGNOSIS — Z7982 Long term (current) use of aspirin: Secondary | ICD-10-CM | POA: Insufficient documentation

## 2011-08-02 DIAGNOSIS — I2589 Other forms of chronic ischemic heart disease: Secondary | ICD-10-CM | POA: Insufficient documentation

## 2011-08-02 DIAGNOSIS — E785 Hyperlipidemia, unspecified: Secondary | ICD-10-CM | POA: Insufficient documentation

## 2011-08-02 DIAGNOSIS — I440 Atrioventricular block, first degree: Secondary | ICD-10-CM | POA: Insufficient documentation

## 2011-08-02 DIAGNOSIS — Z8249 Family history of ischemic heart disease and other diseases of the circulatory system: Secondary | ICD-10-CM | POA: Insufficient documentation

## 2011-08-02 DIAGNOSIS — I2582 Chronic total occlusion of coronary artery: Secondary | ICD-10-CM | POA: Insufficient documentation

## 2011-08-02 DIAGNOSIS — D869 Sarcoidosis, unspecified: Secondary | ICD-10-CM | POA: Insufficient documentation

## 2011-08-02 DIAGNOSIS — Z9861 Coronary angioplasty status: Secondary | ICD-10-CM | POA: Insufficient documentation

## 2011-08-02 DIAGNOSIS — I2510 Atherosclerotic heart disease of native coronary artery without angina pectoris: Secondary | ICD-10-CM | POA: Insufficient documentation

## 2011-08-02 DIAGNOSIS — I498 Other specified cardiac arrhythmias: Secondary | ICD-10-CM | POA: Insufficient documentation

## 2011-08-02 DIAGNOSIS — I252 Old myocardial infarction: Secondary | ICD-10-CM | POA: Insufficient documentation

## 2011-08-02 DIAGNOSIS — K219 Gastro-esophageal reflux disease without esophagitis: Secondary | ICD-10-CM | POA: Insufficient documentation

## 2011-08-03 LAB — UIFE/LIGHT CHAINS/TP QN, 24-HR UR
Albumin, U: DETECTED
Alpha 1, Urine: DETECTED — AB
Alpha 2, Urine: DETECTED — AB
Beta, Urine: DETECTED — AB
Free Kappa Lt Chains,Ur: 0.16 mg/dL (ref 0.14–2.42)
Free Lambda Lt Chains,Ur: <0.03 mg/dL (ref 0.02–0.67)
Free Lt Chn Excr Rate: 3.2 mg/d
Time: 24 hours
Total Protein, Urine-Ur/day: 12 mg/d (ref 10–140)
Total Protein, Urine: 0.6 mg/dL
Volume, Urine: 2000 mL

## 2011-08-03 LAB — CREATININE CLEARANCE, URINE, 24 HOUR
Collection Interval-CRCL: 24 hours
Creatinine Clearance: 144 mL/min — ABNORMAL HIGH (ref 75–125)
Creatinine, 24H Ur: 1807 mg/d (ref 800–2000)
Creatinine, Urine: 90.3 mg/dL
Creatinine: 0.87 mg/dL (ref 0.50–1.35)
Urine Total Volume-CRCL: 2000 mL

## 2011-08-04 ENCOUNTER — Encounter (HOSPITAL_COMMUNITY): Payer: Medicare Other

## 2011-08-05 ENCOUNTER — Telehealth: Payer: Self-pay | Admitting: Cardiovascular Disease

## 2011-08-05 NOTE — Telephone Encounter (Signed)
I told pt he should keep scheduled appt with hematologist and also scheduled appt in September with Dr.Cooper and to continue at cardiac rehab and to call us back if symptoms increase or change. Pt also states he had PVC's yesterday at Cardiac Rehab. He was not aware of these. Pt states that this are to be faxed to our office. I told pt I would look for fax.

## 2011-08-05 NOTE — Telephone Encounter (Signed)
Spoke with pt who reports he had pain in back between shoulders yesterday and today.  Took NTG yesterday and after 10 minutes had relief of pain. No other complaints.  Pain same as prior to seeing Tereso Newcomer, PA on July 27, 2011.  No change in symptoms after Imdur added. Pt had treadmill on July 30, 2011. Treadmill reviewed today by Dr. Excell Seltzer.  Pt states he is able to exercise at rehab without pain and pain usually occurs at rest. Pt is to see hematologist on August 11, 2011.

## 2011-08-05 NOTE — Telephone Encounter (Signed)
Pt had angina attack yesterday took 1 nitro resolved within , seems to be having them regularly since June 7th's heart attack, denies sob, dizziness, happening left back

## 2011-08-06 ENCOUNTER — Encounter: Payer: Self-pay | Admitting: Cardiovascular Disease

## 2011-08-06 ENCOUNTER — Encounter (HOSPITAL_COMMUNITY): Payer: Medicare Other

## 2011-08-09 ENCOUNTER — Encounter (HOSPITAL_COMMUNITY): Payer: Medicare Other

## 2011-08-11 ENCOUNTER — Encounter (HOSPITAL_COMMUNITY): Payer: Medicare Other

## 2011-08-11 ENCOUNTER — Encounter (HOSPITAL_BASED_OUTPATIENT_CLINIC_OR_DEPARTMENT_OTHER): Payer: Medicare Other | Admitting: Oncology

## 2011-08-11 DIAGNOSIS — D472 Monoclonal gammopathy: Secondary | ICD-10-CM

## 2011-08-13 ENCOUNTER — Encounter (HOSPITAL_COMMUNITY): Payer: Medicare Other

## 2011-08-16 ENCOUNTER — Telehealth: Payer: Self-pay | Admitting: Cardiovascular Disease

## 2011-08-16 ENCOUNTER — Encounter (HOSPITAL_COMMUNITY): Payer: Medicare Other

## 2011-08-16 NOTE — Telephone Encounter (Signed)
Pt having a round of chemo this Thursday, wants to make sure this is ok?

## 2011-08-16 NOTE — Telephone Encounter (Signed)
Advised patient that it should be ok for him to have chemo. If he starts experiencing SOB or CP, he will inform the nurses.

## 2011-08-18 ENCOUNTER — Encounter (HOSPITAL_COMMUNITY): Payer: Medicare Other

## 2011-08-19 ENCOUNTER — Telehealth: Payer: Self-pay | Admitting: *Deleted

## 2011-08-19 ENCOUNTER — Other Ambulatory Visit: Payer: Self-pay | Admitting: Oncology

## 2011-08-19 ENCOUNTER — Encounter (HOSPITAL_BASED_OUTPATIENT_CLINIC_OR_DEPARTMENT_OTHER): Payer: Medicare Other | Admitting: Oncology

## 2011-08-19 ENCOUNTER — Encounter: Payer: Self-pay | Admitting: Cardiovascular Disease

## 2011-08-19 DIAGNOSIS — Z5112 Encounter for antineoplastic immunotherapy: Secondary | ICD-10-CM

## 2011-08-19 DIAGNOSIS — D472 Monoclonal gammopathy: Secondary | ICD-10-CM

## 2011-08-19 DIAGNOSIS — I2510 Atherosclerotic heart disease of native coronary artery without angina pectoris: Secondary | ICD-10-CM

## 2011-08-19 DIAGNOSIS — I10 Essential (primary) hypertension: Secondary | ICD-10-CM

## 2011-08-19 DIAGNOSIS — G9009 Other idiopathic peripheral autonomic neuropathy: Secondary | ICD-10-CM

## 2011-08-19 LAB — CBC WITH DIFFERENTIAL/PLATELET
BASO%: 0.9 % (ref 0.0–2.0)
Basophils Absolute: 0 10*3/uL (ref 0.0–0.1)
EOS%: 7.7 % — ABNORMAL HIGH (ref 0.0–7.0)
Eosinophils Absolute: 0.4 10*3/uL (ref 0.0–0.5)
HCT: 41.4 % (ref 38.4–49.9)
HGB: 14.4 g/dL (ref 13.0–17.1)
LYMPH%: 20.5 % (ref 14.0–49.0)
MCH: 30.1 pg (ref 27.2–33.4)
MCHC: 34.8 g/dL (ref 32.0–36.0)
MCV: 86.6 fL (ref 79.3–98.0)
MONO#: 0.6 10*3/uL (ref 0.1–0.9)
MONO%: 12.4 % (ref 0.0–14.0)
NEUT#: 2.7 10*3/uL (ref 1.5–6.5)
NEUT%: 58.5 % (ref 39.0–75.0)
Platelets: 185 10*3/uL (ref 140–400)
RBC: 4.78 10*6/uL (ref 4.20–5.82)
RDW: 13.4 % (ref 11.0–14.6)
WBC: 4.5 10*3/uL (ref 4.0–10.3)
lymph#: 0.9 10*3/uL (ref 0.9–3.3)
nRBC: 0 % (ref 0–0)

## 2011-08-19 MED ORDER — CLOPIDOGREL BISULFATE 75 MG PO TABS: 75.0000 mg | ORAL_TABLET | Freq: Every day | ORAL | Status: DC

## 2011-08-19 NOTE — Telephone Encounter (Signed)
Pt calling to make sure it is OK for him to start Plavix.  Reviewed with pt that it was recommended by Dr Graciela Husbands for him to stop Brillenta and start Plavix.   RX sent in as requested.

## 2011-08-19 NOTE — Telephone Encounter (Signed)
Dr. Cyndie Chime told patient he has a low blood count having a reaction to Brilinta. Pt was given a rx for plavix 75 mg. pls advise.

## 2011-08-19 NOTE — Telephone Encounter (Signed)
DOD call to Dr. Graciela Husbands- per Dr. Graciela Husbands, Dr. Cyndie Chime reports suspicion of a the patient having a reaction to Brilinta (leukopenia). Per Dr. Graciela Husbands, in the absence of a P2Y12 test being performed, the patient will be switched to Plavix 75mg  once daily. I will forward to Dr. Excell Seltzer as an Lorain Childes.

## 2011-08-20 ENCOUNTER — Telehealth: Payer: Self-pay | Admitting: *Deleted

## 2011-08-20 ENCOUNTER — Encounter (HOSPITAL_COMMUNITY): Payer: Medicare Other

## 2011-08-20 MED ORDER — PANTOPRAZOLE SODIUM 40 MG PO TBEC: 40.0000 mg | DELAYED_RELEASE_TABLET | Freq: Every day | ORAL | Status: DC

## 2011-08-20 NOTE — Telephone Encounter (Signed)
Pt left another VM - He would like MD's advisement on whether plavix is causing problems w/his WBC count?

## 2011-08-20 NOTE — Telephone Encounter (Signed)
Per pt call, pt would like to talk to cardiac nurse about the interference of plavix with prevacid. Please return pt call to advise/discuss.

## 2011-08-20 NOTE — Telephone Encounter (Signed)
1. protonix is an ok substitution for prevacid  2. Thrombocytopenia (low platelets) is not listed as a side effect of plavix.

## 2011-08-20 NOTE — Telephone Encounter (Signed)
Pt was on relenta, hematologist felt that he was having reaction to med (low plts) and changed to plavix. He was somewhat upset that neither the hematologist nor "head of cardiology" picked up on the plavix & protonix interaction. Says cardiac rehab nurse pointed out the problem. Pt is going to try ranitidine instead of filling the 100 + dollar RX for protonix, if this doesn't work he will pick up RX.   Pt just wanted DR Norins to be aware of what was going on w/his care.

## 2011-08-20 NOTE — Telephone Encounter (Signed)
Pt was recently put on plavix. He was given protonix in place of prevacid and wants to know if this is ok?

## 2011-08-20 NOTE — Telephone Encounter (Signed)
Spoke with pt, he was started on plavix and the cardiac rehab nurse told him to call due to the interaction between plavix and prevacid. The pt will change to protonix. Script sent to the [employer]

## 2011-08-23 ENCOUNTER — Encounter (HOSPITAL_COMMUNITY): Payer: Medicare Other

## 2011-08-25 ENCOUNTER — Encounter (HOSPITAL_COMMUNITY): Payer: Medicare Other

## 2011-08-27 ENCOUNTER — Encounter (HOSPITAL_COMMUNITY): Payer: Medicare Other

## 2011-08-30 ENCOUNTER — Encounter (HOSPITAL_COMMUNITY): Payer: Medicare Other

## 2011-09-01 ENCOUNTER — Encounter (HOSPITAL_COMMUNITY): Payer: Medicare Other | Attending: Cardiovascular Disease

## 2011-09-01 DIAGNOSIS — I498 Other specified cardiac arrhythmias: Secondary | ICD-10-CM | POA: Insufficient documentation

## 2011-09-01 DIAGNOSIS — I252 Old myocardial infarction: Secondary | ICD-10-CM | POA: Insufficient documentation

## 2011-09-01 DIAGNOSIS — I2582 Chronic total occlusion of coronary artery: Secondary | ICD-10-CM | POA: Insufficient documentation

## 2011-09-01 DIAGNOSIS — E785 Hyperlipidemia, unspecified: Secondary | ICD-10-CM | POA: Insufficient documentation

## 2011-09-01 DIAGNOSIS — K219 Gastro-esophageal reflux disease without esophagitis: Secondary | ICD-10-CM | POA: Insufficient documentation

## 2011-09-01 DIAGNOSIS — I440 Atrioventricular block, first degree: Secondary | ICD-10-CM | POA: Insufficient documentation

## 2011-09-01 DIAGNOSIS — Z9861 Coronary angioplasty status: Secondary | ICD-10-CM | POA: Insufficient documentation

## 2011-09-01 DIAGNOSIS — I2589 Other forms of chronic ischemic heart disease: Secondary | ICD-10-CM | POA: Insufficient documentation

## 2011-09-01 DIAGNOSIS — Z5189 Encounter for other specified aftercare: Secondary | ICD-10-CM | POA: Insufficient documentation

## 2011-09-01 DIAGNOSIS — D869 Sarcoidosis, unspecified: Secondary | ICD-10-CM | POA: Insufficient documentation

## 2011-09-01 DIAGNOSIS — I10 Essential (primary) hypertension: Secondary | ICD-10-CM | POA: Insufficient documentation

## 2011-09-01 DIAGNOSIS — Z8249 Family history of ischemic heart disease and other diseases of the circulatory system: Secondary | ICD-10-CM | POA: Insufficient documentation

## 2011-09-01 DIAGNOSIS — I2510 Atherosclerotic heart disease of native coronary artery without angina pectoris: Secondary | ICD-10-CM | POA: Insufficient documentation

## 2011-09-01 DIAGNOSIS — Z7982 Long term (current) use of aspirin: Secondary | ICD-10-CM | POA: Insufficient documentation

## 2011-09-01 DIAGNOSIS — Z79899 Other long term (current) drug therapy: Secondary | ICD-10-CM | POA: Insufficient documentation

## 2011-09-03 ENCOUNTER — Encounter (HOSPITAL_COMMUNITY): Payer: Medicare Other

## 2011-09-06 ENCOUNTER — Encounter (HOSPITAL_COMMUNITY): Payer: Medicare Other

## 2011-09-08 ENCOUNTER — Encounter (HOSPITAL_COMMUNITY): Payer: Medicare Other

## 2011-09-09 ENCOUNTER — Encounter: Payer: Self-pay | Admitting: Cardiovascular Disease

## 2011-09-09 ENCOUNTER — Ambulatory Visit (INDEPENDENT_AMBULATORY_CARE_PROVIDER_SITE_OTHER): Payer: Medicare Other | Admitting: Cardiovascular Disease

## 2011-09-09 VITALS — BP 130/78 | HR 59 | Ht 70.0 in | Wt 218.1 lb

## 2011-09-09 DIAGNOSIS — I2510 Atherosclerotic heart disease of native coronary artery without angina pectoris: Secondary | ICD-10-CM

## 2011-09-09 DIAGNOSIS — E785 Hyperlipidemia, unspecified: Secondary | ICD-10-CM

## 2011-09-09 MED ORDER — ISOSORBIDE MONONITRATE ER 30 MG PO TB24: 30.0000 mg | ORAL_TABLET | Freq: Every day | ORAL | Status: DC

## 2011-09-09 MED ORDER — NITROGLYCERIN 0.4 MG SL SUBL: 0.4000 mg | SUBLINGUAL_TABLET | SUBLINGUAL | Status: DC | PRN

## 2011-09-09 NOTE — Assessment & Plan Note (Signed)
The patient's diagnostic studies have been reassuring regarding his coronary status. He had a negative pressure wire analysis of the left main stem and also has undergone exercise treadmill testing without significant ischemic changes. I have reviewed his cardiac catheter films again and I feel he is stable to continue with his current medical program and ongoing risk factor modification with cardiac rehabilitation. We discussed whether brilinta was truly responsible for his neutropenia, and while this is uncertain we have decided to continue his current regimen of aspirin and Plavix. Will add isosorbide mononitrate 30 mg daily to his medical regimen since his upper back pain seems to be nitroglycerin responsive. Otherwise he will continue his current medical program. I would like to repeat a treadmill study in 6 months to assure stability and this patient with left main disease.

## 2011-09-09 NOTE — Patient Instructions (Signed)
Your physician has requested that you have an exercise tolerance test. For further information please visit [email or web address] Please also follow instruction sheet, as given. To be done in Feb. 2013.

## 2011-09-09 NOTE — Assessment & Plan Note (Signed)
Lipids are above goal with most recent LDL 1:15. The patient has had some difficulty with statin intolerance we will continue him on Lipitor at his current dose.

## 2011-09-09 NOTE — Progress Notes (Signed)
HPI:  This is a 70 year old gentleman presented for followup evaluation.  He has a history of CAD, status post NSTEMI 06/01/11 treated with a drug-eluting stent to the RCA. He also had residual left main stenosis.  This is been evaluated both by exercise treadmill study and pressure wire analysis in the cardiac Cath Lab. The patient's FFR was negative and his exercise treadmill demonstrated no ischemic EKG changes.  His other medical history includes hypertension, hyperlipidemia, colon cancer status post resection in 2000, chronic idiopathic demyelinating polyneuropathy, Monoclonal gammopathy of unknown significance, pulmonary sarcoid and GERD.  The patient continues to have episodic, unpredictable pain in the upper back. This is reminiscent of the pain he had time of his myocardial infarction. However, he denies any discomfort with exertion. He participates regularly in cardiac rehabilitation and has no symptoms with walking or exercising. His upper back and neck pain always occur at rest. There is some relief with sublingual nitroglycerin. He denies shortness of breath, edema, palpitations, lightheadedness, or syncope.  The patient developed neutropenia, and review of his medications brought up concern regarding brilinta. This was discontinued and the patient was started on Plavix. He had some improvement of his neutropenia even before the medication changes made, but he has stayed off of brilinta and continues on dual antiplatelet therapy with aspirin and Plavix. The patient has no other complaints today.  Outpatient Encounter Prescriptions as of 09/09/2011  Medication Sig Dispense Refill  . albuterol (PROVENTIL HFA) 108 (90 BASE) MCG/ACT inhaler Inhale 2 puffs into the lungs every 6 (six) hours as needed.        [name] amLODipine (NORVASC) 10 MG tablet Take 10 mg by mouth daily.        [name] aspirin 81 MG tablet Take 81 mg by mouth daily.        [name] atorvastatin (LIPITOR) 20 MG tablet Take 1 tablet (20 mg total) by  mouth daily.  90 tablet  3  . Budesonide (PULMICORT FLEXHALER) 90 MCG/ACT inhaler Inhale 2 puffs into the lungs 2 (two) times daily.        . Cholecalciferol (VITAMIN D-3) 5000 UNITS TABS Take 1 tablet by mouth daily.        . clopidogrel (PLAVIX) 75 MG tablet Take 1 tablet (75 mg total) by mouth daily.  90 tablet  3  . fluticasone (VERAMYST) 27.5 MCG/SPRAY nasal spray Place 1 spray into the nose 2 (two) times daily.        [name] gabapentin (NEURONTIN) 800 MG tablet Take 800 mg by mouth 3 (three) times daily.        . isosorbide mononitrate (IMDUR) 30 MG 24 hr tablet Take 30 mg by mouth daily.        . Multiple Vitamin (MULTIVITAMIN) tablet Take 1 tablet by mouth daily.        . nitroGLYCERIN (NITROSTAT) 0.4 MG SL tablet Place 0.4 mg under the tongue every 5 (five) minutes as needed.        . RiTUXimab (RITUXAN IV) Inject into the vein. every 2 month       . silodosin (RAPAFLO) 8 MG CAPS capsule Take 1 capsule (8 mg total) by mouth daily with breakfast.      . triamterene-hydrochlorothiazide (MAXZIDE-25) 37.5-25 MG per tablet Take 1 tablet by mouth daily.        [name] DISCONTD: benzonatate (TESSALON) 100 MG capsule Take 100 mg by mouth 3 (three) times daily as needed.        [name] DISCONTD: isosorbide mononitrate (  IMDUR) 30 MG CR tablet Take 1 tablet (30 mg total) by mouth every morning.  30 tablet  6  . DISCONTD: pantoprazole (PROTONIX) 40 MG tablet Take 1 tablet (40 mg total) by mouth daily.  90 tablet  4    No Known Allergies  Past Medical History  Diagnosis Date  . BENIGN PROSTATIC HYPERTROPHY, WITH OBSTRUCTION 05/28/2010  . COLONIC POLYPS, ADENOMATOUS, HX OF 05/28/2010  . GENERALIZED OSTEOARTHROSIS UNSPECIFIED SITE 05/28/2010  . GERD 05/28/2010  . HYPERLIPIDEMIA 05/28/2010  . Intrinsic asthma, 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  . Malignant neoplasm of descending colon 05/28/2010  . MITRAL VALVE PROLAPSE 05/28/2010  . 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 PALPITATIONS, CHRONIC 05/28/2010  . SLEEP APNEA, OBSTRUCTIVE, MODERATE  05/28/2010  . UNSPECIFIED INFLAMMATORY AND TOXIC NEUROPATHY 05/28/2010  . VITAMIN D DEFICIENCY 05/28/2010  . CAD (coronary artery disease)     a. s/p NSTEMI 06/01/11: DES to RCA;  b. cath 06/25/11:   dLM 50-60% (FFR 0.87), prox to mid LAD 40-50%, D1 50%, pCFX 50%, RCA stent ok, dPDA 80%, EF 55-60%.  His FFR was felt to be negative and therefore medical therapy was recommended ;  echo 6/12: EF 55-60%, mild AS     ROS: Negative except as per HPI  BP 130/78  Pulse 59  Ht 5\' 10"  (1.778 m)  Wt 218 lb 1.9 oz (98.939 kg)  BMI 31.30 kg/m2  PHYSICAL EXAM: Pt is alert and oriented, NAD HEENT: normal Neck: JVP - normal, carotids 2+= without bruits Lungs: CTA bilaterally CV: RRR without murmur or gallop Abd: soft, NT, Positive BS, no hepatomegaly Ext: no C/C/E, distal pulses intact and equal Skin: warm/dry no rash  EKG:  Normal sinus rhythm with first degree AV block 59 beats per minute, otherwise within normal limits.  ASSESSMENT AND PLAN:

## 2011-09-10 ENCOUNTER — Encounter (HOSPITAL_COMMUNITY): Payer: Medicare Other

## 2011-09-13 ENCOUNTER — Encounter (HOSPITAL_COMMUNITY): Payer: Medicare Other

## 2011-09-15 ENCOUNTER — Encounter (HOSPITAL_COMMUNITY): Payer: Medicare Other

## 2011-09-17 ENCOUNTER — Encounter (HOSPITAL_COMMUNITY): Payer: Medicare Other

## 2011-09-20 ENCOUNTER — Encounter (HOSPITAL_COMMUNITY): Payer: Medicare Other

## 2011-09-22 ENCOUNTER — Encounter (HOSPITAL_COMMUNITY): Payer: Medicare Other

## 2011-09-24 ENCOUNTER — Encounter (HOSPITAL_COMMUNITY): Payer: Medicare Other

## 2011-09-27 ENCOUNTER — Encounter (HOSPITAL_COMMUNITY): Payer: Medicare Other | Attending: Cardiovascular Disease

## 2011-09-27 DIAGNOSIS — I498 Other specified cardiac arrhythmias: Secondary | ICD-10-CM | POA: Insufficient documentation

## 2011-09-27 DIAGNOSIS — Z79899 Other long term (current) drug therapy: Secondary | ICD-10-CM | POA: Insufficient documentation

## 2011-09-27 DIAGNOSIS — Z7982 Long term (current) use of aspirin: Secondary | ICD-10-CM | POA: Insufficient documentation

## 2011-09-27 DIAGNOSIS — I440 Atrioventricular block, first degree: Secondary | ICD-10-CM | POA: Insufficient documentation

## 2011-09-27 DIAGNOSIS — Z5189 Encounter for other specified aftercare: Secondary | ICD-10-CM | POA: Insufficient documentation

## 2011-09-27 DIAGNOSIS — K219 Gastro-esophageal reflux disease without esophagitis: Secondary | ICD-10-CM | POA: Insufficient documentation

## 2011-09-27 DIAGNOSIS — E785 Hyperlipidemia, unspecified: Secondary | ICD-10-CM | POA: Insufficient documentation

## 2011-09-27 DIAGNOSIS — I2582 Chronic total occlusion of coronary artery: Secondary | ICD-10-CM | POA: Insufficient documentation

## 2011-09-27 DIAGNOSIS — Z9861 Coronary angioplasty status: Secondary | ICD-10-CM | POA: Insufficient documentation

## 2011-09-27 DIAGNOSIS — I2510 Atherosclerotic heart disease of native coronary artery without angina pectoris: Secondary | ICD-10-CM | POA: Insufficient documentation

## 2011-09-27 DIAGNOSIS — Z8249 Family history of ischemic heart disease and other diseases of the circulatory system: Secondary | ICD-10-CM | POA: Insufficient documentation

## 2011-09-27 DIAGNOSIS — I252 Old myocardial infarction: Secondary | ICD-10-CM | POA: Insufficient documentation

## 2011-09-27 DIAGNOSIS — I2589 Other forms of chronic ischemic heart disease: Secondary | ICD-10-CM | POA: Insufficient documentation

## 2011-09-27 DIAGNOSIS — D869 Sarcoidosis, unspecified: Secondary | ICD-10-CM | POA: Insufficient documentation

## 2011-09-27 DIAGNOSIS — I10 Essential (primary) hypertension: Secondary | ICD-10-CM | POA: Insufficient documentation

## 2011-09-29 ENCOUNTER — Encounter (HOSPITAL_COMMUNITY): Payer: Medicare Other

## 2011-10-01 ENCOUNTER — Encounter (HOSPITAL_COMMUNITY): Payer: Medicare Other

## 2011-10-04 ENCOUNTER — Encounter (HOSPITAL_COMMUNITY): Payer: Medicare Other

## 2011-10-06 ENCOUNTER — Encounter (HOSPITAL_COMMUNITY): Payer: Medicare Other

## 2011-10-08 ENCOUNTER — Encounter (HOSPITAL_COMMUNITY): Payer: Medicare Other

## 2011-10-11 ENCOUNTER — Encounter (HOSPITAL_COMMUNITY): Payer: Medicare Other

## 2011-10-13 ENCOUNTER — Encounter (HOSPITAL_COMMUNITY): Payer: Medicare Other

## 2011-10-14 ENCOUNTER — Encounter (HOSPITAL_BASED_OUTPATIENT_CLINIC_OR_DEPARTMENT_OTHER): Payer: Medicare Other | Admitting: Oncology

## 2011-10-14 ENCOUNTER — Other Ambulatory Visit: Payer: Self-pay | Admitting: Oncology

## 2011-10-14 DIAGNOSIS — I2510 Atherosclerotic heart disease of native coronary artery without angina pectoris: Secondary | ICD-10-CM

## 2011-10-14 DIAGNOSIS — D472 Monoclonal gammopathy: Secondary | ICD-10-CM

## 2011-10-14 DIAGNOSIS — I10 Essential (primary) hypertension: Secondary | ICD-10-CM

## 2011-10-14 DIAGNOSIS — G9009 Other idiopathic peripheral autonomic neuropathy: Secondary | ICD-10-CM

## 2011-10-14 DIAGNOSIS — Z5111 Encounter for antineoplastic chemotherapy: Secondary | ICD-10-CM

## 2011-10-14 LAB — CBC WITH DIFFERENTIAL/PLATELET
BASO%: 1 % (ref 0.0–2.0)
Basophils Absolute: 0.1 10*3/uL (ref 0.0–0.1)
EOS%: 4.3 % (ref 0.0–7.0)
Eosinophils Absolute: 0.2 10*3/uL (ref 0.0–0.5)
HCT: 43 % (ref 38.4–49.9)
HGB: 14.9 g/dL (ref 13.0–17.1)
LYMPH%: 22.4 % (ref 14.0–49.0)
MCH: 30.2 pg (ref 27.2–33.4)
MCHC: 34.7 g/dL (ref 32.0–36.0)
MCV: 87.2 fL (ref 79.3–98.0)
MONO#: 0.6 10*3/uL (ref 0.1–0.9)
MONO%: 12.5 % (ref 0.0–14.0)
NEUT#: 3.1 10*3/uL (ref 1.5–6.5)
NEUT%: 59.8 % (ref 39.0–75.0)
Platelets: 173 10*3/uL (ref 140–400)
RBC: 4.93 10*6/uL (ref 4.20–5.82)
RDW: 13.7 % (ref 11.0–14.6)
WBC: 5.1 10*3/uL (ref 4.0–10.3)
lymph#: 1.1 10*3/uL (ref 0.9–3.3)
nRBC: 0 % (ref 0–0)

## 2011-10-14 LAB — MORPHOLOGY: PLT EST: ADEQUATE

## 2011-10-14 LAB — CHCC SMEAR

## 2011-10-15 ENCOUNTER — Encounter (HOSPITAL_COMMUNITY): Payer: Medicare Other

## 2011-10-19 LAB — COMPREHENSIVE METABOLIC PANEL
ALT: 25 U/L (ref 0–53)
AST: 27 U/L (ref 0–37)
Albumin: 4.7 g/dL (ref 3.5–5.2)
Alkaline Phosphatase: 58 U/L (ref 39–117)
BUN: 13 mg/dL (ref 6–23)
CO2: 27 mEq/L (ref 19–32)
Calcium: 10.2 mg/dL (ref 8.4–10.5)
Chloride: 99 mEq/L (ref 96–112)
Creatinine, Ser: 0.79 mg/dL (ref 0.50–1.35)
Glucose, Bld: 57 mg/dL — ABNORMAL LOW (ref 70–99)
Potassium: 3.8 mEq/L (ref 3.5–5.3)
Sodium: 141 mEq/L (ref 135–145)
Total Bilirubin: 0.6 mg/dL (ref 0.3–1.2)
Total Protein: 7.2 g/dL (ref 6.0–8.3)

## 2011-10-19 LAB — IMMUNOFIXATION ELECTROPHORESIS
IgA: 157 mg/dL (ref 68–379)
IgG (Immunoglobin G), Serum: 802 mg/dL (ref 650–1600)
IgM, Serum: 335 mg/dL — ABNORMAL HIGH (ref 41–251)
Total Protein, Serum Electrophoresis: 7.2 g/dL (ref 6.0–8.3)

## 2011-10-22 ENCOUNTER — Other Ambulatory Visit: Payer: Self-pay | Admitting: Oncology

## 2011-10-22 ENCOUNTER — Encounter: Payer: Medicare Other | Admitting: Oncology

## 2011-10-22 DIAGNOSIS — D472 Monoclonal gammopathy: Secondary | ICD-10-CM

## 2011-11-22 ENCOUNTER — Telehealth: Payer: Self-pay | Admitting: *Deleted

## 2011-11-22 ENCOUNTER — Telehealth: Payer: Self-pay | Admitting: Cardiovascular Disease

## 2011-11-22 ENCOUNTER — Other Ambulatory Visit: Payer: Self-pay | Admitting: Internal Medicine

## 2011-11-22 MED ORDER — BUDESONIDE 90 MCG/ACT IN AEPB: 2.0000 | INHALATION_SPRAY | Freq: Two times a day (BID) | RESPIRATORY_TRACT | Status: DC

## 2011-11-22 MED ORDER — FLUTICASONE FUROATE 27.5 MCG/SPRAY NA SUSP: 1.0000 | Freq: Two times a day (BID) | NASAL | Status: DC

## 2011-11-22 NOTE — Telephone Encounter (Signed)
The pt called and is requesting 90 day refills of Pulmicort Flexhaler and Fluticasone sent to [facility] on [facility].   Thanks!

## 2011-11-22 NOTE — Telephone Encounter (Signed)
Pt. Called.  He appts for chemo on 12/17 and 2/19, but no appts. To see Dr. Cyndie Chime.  He was told someone would call him.  Please call him with appt. On cell number [phone].  Will route to Dr. Ottis Stain  And Sabino Snipes RN

## 2011-11-22 NOTE — Telephone Encounter (Signed)
Can we get him on Misty Stanley' schedule for 12/18? 45 minutes.  Is she in office that day?

## 2011-11-22 NOTE — Telephone Encounter (Signed)
This pt is due for a GXT in February 2013. The pt would like to schedule this test. I will forward this message to Lela so that she can contact the pt with an appointment.  (Order is already in the system)

## 2011-11-22 NOTE — Telephone Encounter (Signed)
New problem Pt was calling about test he was to have done in February. Please call him back

## 2011-11-23 ENCOUNTER — Other Ambulatory Visit: Payer: Self-pay | Admitting: *Deleted

## 2011-11-23 NOTE — Telephone Encounter (Signed)
POF to schedulers.

## 2011-11-25 ENCOUNTER — Telehealth: Payer: Self-pay | Admitting: Oncology

## 2011-11-25 NOTE — Telephone Encounter (Signed)
Talked to pt and gave him appt for 12/18 per MD. The order in the inbasket was not showing the comments, Judeth Cornfield from [employer] talked to Dr. Reece Agar and gave me the date of 12/14/11 with Misty Stanley for 45 minutes

## 2011-12-11 ENCOUNTER — Other Ambulatory Visit: Payer: Self-pay | Admitting: Oncology

## 2011-12-11 ENCOUNTER — Encounter: Payer: Self-pay | Admitting: Oncology

## 2011-12-11 DIAGNOSIS — D472 Monoclonal gammopathy: Secondary | ICD-10-CM

## 2011-12-11 DIAGNOSIS — I2510 Atherosclerotic heart disease of native coronary artery without angina pectoris: Secondary | ICD-10-CM

## 2011-12-11 DIAGNOSIS — I219 Acute myocardial infarction, unspecified: Secondary | ICD-10-CM | POA: Insufficient documentation

## 2011-12-11 HISTORY — DX: Monoclonal gammopathy: D47.2

## 2011-12-11 NOTE — Progress Notes (Signed)
CC: Derrick [name], M.D. Derrick Fells. Excell Seltzer, MD Derrick Gess. 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  Please see my 08/11/2011 note for full details.  Briefly, he was diagnosed with sarcoidosis in March 2001.  He underwent open lung biopsy 03/10/2010.  Starting in about 2004, he began to develop peripheral neuropathy.  This progressed and he underwent neurologic evaluation in 2009.  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  Bone marrow biopsies were negative for myeloma or amyloidosis.  Total serum IgM immunoglobulin was normal and no M spike on SPEP.  A 24-hour urine total protein normal with normal immunofixation electrophoresis.  Hepatitis A, B, C negative.  Cold agglutinins not detected.  A skeletal bone survey done 02/04/2011 showed "apparent lytic bone lesion" in the frontal bone.  CT scan showed multiple small osteolytic lesions.  However, a skeletal bone survey showed no other abnormalities outside the skull.  He is asymptomatic with respect to any bone pain or headaches.  It is really not clear that the changes in his skull bones are malignant and reflect an underlying plasmacytoma.   He was given a diagnosis of chronic inflammatory demyelinating polyneuropathy (CIDP) and started on a trial of Rituxan antibody in approximately 2010.  He initially had no significant improvement in his neurologic symptoms.  However, when a second course was started in April 2012, he did report significant improvement in his symptoms.  Neuropathy is much less.  Ability to ambulate is better.  Overall strength is better.  He even notes improvement in his pulmonary status and has been able to decrease his Flonase from 4 times a day averaged to 2 times a day.   At the time of his first  visit with me on 08/11/2011, he was noted to have leukopenia with eosinophilia.  He had recently been started on a new antiplatelet agent in view of an acute myocardial infarction, which he had on 06/01/2011 requiring a drug-eluting stent to the RCA.  After consultation with his cardiologist, I stopped the ticagrelor and substituted with Plavix.  Followup blood count showed normalization of the white count and fall in the eosinophils.  This happened rather rapidly and it is unclear whether the ticagrelor was actually the culprit.  In any event, white count now back to normal at 5100 with a normal differential.  Eosinophils have fallen to 4% from 8%.   He tells me today he feels better than he has in a long time.  He is more active.  He is still doing cardiac rehab.  He has lost some weight.  He is not having any chest pain, pressure or palpitations.  He continues to have intermittent dysesthesias, erythromelalgia of the feet.  PHYSICAL EXAMINATION:  Vital Signs:  Blood pressure 146/78, pulse 67 regular.  He is afebrile.  Weight is 212 pounds down from 224 in August.  Head and Neck:  Normal.  Lungs:  Clear and resonant to percussion.  Heart:  Regular cardiac rhythm.  No murmur.  Lymphatic:  No cervical, supraclavicular, axillary or inguinal adenopathy.  Abdomen:  Soft, nontender.  No mass, no organomegaly.  Extremities:  No edema.  No calf tenderness.  Neurologic:  Motor strength is 5/5.  Reflexes absent symmetric at the knees, 1+ symmetric at the biceps.  Sensation is intact to  pin and vibration by tuning fork exam over the fingertips.  LAB:  Hemoglobin is 14.9, hematocrit 43, white count 5100, 60% neutrophils, 22 lymphocytes, 12 monocytes, 4 eosinophils.  Platelets 173,000.  Chemistry profile is normal.  Borderline high calcium at 10.2 which is a chronic finding.  Serum immunoglobulin showed persistent mild elevation of IgM at 335 mg percent, normal up to 251, with presence of monoclonal IgM lambda on  immunofixation electrophoresis.  Previous 24-hour urine collection done here 07/28/2011 showed only 12 mg of total protein in the 24-hour collection and was negative for monoclonal free light chains.  IMPRESSION:   1. Chronic inflammatory demyelinating polyneuropathy.  I am inclined to believe that this is a manifestation of underlying sarcoidosis and not a primary hematologic malignancy.  He has had symptomatic relief and functional improvement on Rituxan 375 mg/sq m IV q.2 months.  I will therefore continue this program. 2. Biopsy-proven sarcoidosis of the lungs. 3. 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 4. Transient leukopenia likely allergic reaction now resolved. 5. Essential hypertension. 6. Benign prostatic hyperplasia. 7. Adenomatous polyps of the colon.   I will re-evaluate him in 3 months with lab and a followup skeletal survey.    ______________________________ Derrick [name], M.D., F.A.C.P. JMG/MEDQ  D:  10/23/2011  T:  10/23/2011  Job:  [id number]

## 2011-12-13 ENCOUNTER — Ambulatory Visit (HOSPITAL_BASED_OUTPATIENT_CLINIC_OR_DEPARTMENT_OTHER): Payer: Medicare Other

## 2011-12-13 ENCOUNTER — Other Ambulatory Visit: Payer: Self-pay | Admitting: Oncology

## 2011-12-13 ENCOUNTER — Other Ambulatory Visit (HOSPITAL_BASED_OUTPATIENT_CLINIC_OR_DEPARTMENT_OTHER): Payer: Medicare Other | Admitting: Lab

## 2011-12-13 ENCOUNTER — Ambulatory Visit (HOSPITAL_COMMUNITY)
Admission: RE | Admit: 2011-12-13 | Discharge: 2011-12-13 | Disposition: A | Discharge status: Home / Self Care | Payer: Medicare Other | Source: Ambulatory Visit | Attending: Oncology | Admitting: Oncology

## 2011-12-13 DIAGNOSIS — G6181 Chronic inflammatory demyelinating polyneuritis: Secondary | ICD-10-CM

## 2011-12-13 DIAGNOSIS — D869 Sarcoidosis, unspecified: Secondary | ICD-10-CM

## 2011-12-13 DIAGNOSIS — D472 Monoclonal gammopathy: Secondary | ICD-10-CM

## 2011-12-13 DIAGNOSIS — G622 Polyneuropathy due to other toxic agents: Secondary | ICD-10-CM

## 2011-12-13 DIAGNOSIS — M545 Low back pain, unspecified: Secondary | ICD-10-CM | POA: Insufficient documentation

## 2011-12-13 DIAGNOSIS — Z5112 Encounter for antineoplastic immunotherapy: Secondary | ICD-10-CM

## 2011-12-13 DIAGNOSIS — G619 Inflammatory polyneuropathy, unspecified: Secondary | ICD-10-CM

## 2011-12-13 DIAGNOSIS — M949 Disorder of cartilage, unspecified: Secondary | ICD-10-CM | POA: Insufficient documentation

## 2011-12-13 DIAGNOSIS — M25559 Pain in unspecified hip: Secondary | ICD-10-CM | POA: Insufficient documentation

## 2011-12-13 DIAGNOSIS — M899 Disorder of bone, unspecified: Secondary | ICD-10-CM | POA: Insufficient documentation

## 2011-12-13 LAB — CBC WITH DIFFERENTIAL/PLATELET
BASO%: 0.9 % (ref 0.0–2.0)
Basophils Absolute: 0 10*3/uL (ref 0.0–0.1)
EOS%: 6.3 % (ref 0.0–7.0)
Eosinophils Absolute: 0.3 10*3/uL (ref 0.0–0.5)
HCT: 39.5 % (ref 38.4–49.9)
HGB: 13.7 g/dL (ref 13.0–17.1)
LYMPH%: 20.7 % (ref 14.0–49.0)
MCH: 31 pg (ref 27.2–33.4)
MCHC: 34.7 g/dL (ref 32.0–36.0)
MCV: 89.4 fL (ref 79.3–98.0)
MONO#: 0.7 10*3/uL (ref 0.1–0.9)
MONO%: 15.5 % — ABNORMAL HIGH (ref 0.0–14.0)
NEUT#: 2.6 10*3/uL (ref 1.5–6.5)
NEUT%: 56.6 % (ref 39.0–75.0)
Platelets: 178 10*3/uL (ref 140–400)
RBC: 4.42 10*6/uL (ref 4.20–5.82)
RDW: 13.4 % (ref 11.0–14.6)
WBC: 4.6 10*3/uL (ref 4.0–10.3)
lymph#: 1 10*3/uL (ref 0.9–3.3)
nRBC: 0 % (ref 0–0)

## 2011-12-13 LAB — MORPHOLOGY: PLT EST: ADEQUATE

## 2011-12-13 MED ORDER — SODIUM CHLORIDE 0.9 % IV SOLN
375.0000 mg/m2 | Freq: Once | INTRAVENOUS | Status: AC
  Administered 2011-12-13: 800 mg via INTRAVENOUS
  Filled 2011-12-13: qty 80

## 2011-12-13 MED ORDER — SODIUM CHLORIDE 0.9 % IV SOLN
Freq: Once | INTRAVENOUS | Status: AC
  Administered 2011-12-13: 09:00:00 via INTRAVENOUS

## 2011-12-13 MED ORDER — DIPHENHYDRAMINE HCL 25 MG PO CAPS
50.0000 mg | ORAL_CAPSULE | Freq: Once | ORAL | Status: AC
  Administered 2011-12-13: 50 mg via ORAL

## 2011-12-13 MED ORDER — ACETAMINOPHEN 325 MG PO TABS
650.0000 mg | ORAL_TABLET | Freq: Once | ORAL | Status: AC
  Administered 2011-12-13: 650 mg via ORAL

## 2011-12-14 ENCOUNTER — Ambulatory Visit: Payer: Medicare Other | Admitting: Nurse Practitioner

## 2011-12-15 LAB — COMPREHENSIVE METABOLIC PANEL
ALT: 25 U/L (ref 0–53)
AST: 31 U/L (ref 0–37)
Albumin: 4.4 g/dL (ref 3.5–5.2)
Alkaline Phosphatase: 50 U/L (ref 39–117)
BUN: 15 mg/dL (ref 6–23)
CO2: 28 mEq/L (ref 19–32)
Calcium: 10.1 mg/dL (ref 8.4–10.5)
Chloride: 103 mEq/L (ref 96–112)
Creatinine, Ser: 0.72 mg/dL (ref 0.50–1.35)
Glucose, Bld: 88 mg/dL (ref 70–99)
Potassium: 4.2 mEq/L (ref 3.5–5.3)
Sodium: 139 mEq/L (ref 135–145)
Total Bilirubin: 0.7 mg/dL (ref 0.3–1.2)
Total Protein: 6.8 g/dL (ref 6.0–8.3)

## 2011-12-15 LAB — IMMUNOFIXATION ELECTROPHORESIS
IgA: 141 mg/dL (ref 68–379)
IgG (Immunoglobin G), Serum: 787 mg/dL (ref 650–1600)
IgM, Serum: 323 mg/dL — ABNORMAL HIGH (ref 41–251)
Total Protein, Serum Electrophoresis: 6.8 g/dL (ref 6.0–8.3)

## 2011-12-15 LAB — LACTATE DEHYDROGENASE: LDH: 173 U/L (ref 94–250)

## 2011-12-15 LAB — KAPPA/LAMBDA LIGHT CHAINS
Kappa free light chain: 1.89 mg/dL (ref 0.33–1.94)
Kappa:Lambda Ratio: 1.63 (ref 0.26–1.65)
Lambda Free Lght Chn: 1.16 mg/dL (ref 0.57–2.63)

## 2011-12-29 ENCOUNTER — Other Ambulatory Visit (INDEPENDENT_AMBULATORY_CARE_PROVIDER_SITE_OTHER): Payer: Medicare Other

## 2011-12-29 ENCOUNTER — Ambulatory Visit (HOSPITAL_BASED_OUTPATIENT_CLINIC_OR_DEPARTMENT_OTHER): Payer: Medicare Other | Admitting: Nurse Practitioner

## 2011-12-29 ENCOUNTER — Telehealth: Payer: Self-pay | Admitting: Oncology

## 2011-12-29 ENCOUNTER — Ambulatory Visit (INDEPENDENT_AMBULATORY_CARE_PROVIDER_SITE_OTHER): Payer: Medicare Other | Admitting: Internal Medicine

## 2011-12-29 VITALS — BP 141/78 | HR 58 | Temp 97.2°F | Ht 70.0 in | Wt 205.9 lb

## 2011-12-29 VITALS — BP 128/64 | HR 69 | Temp 97.0°F | Wt 206.0 lb

## 2011-12-29 DIAGNOSIS — E785 Hyperlipidemia, unspecified: Secondary | ICD-10-CM | POA: Diagnosis not present

## 2011-12-29 DIAGNOSIS — D472 Monoclonal gammopathy: Secondary | ICD-10-CM

## 2011-12-29 DIAGNOSIS — N400 Enlarged prostate without lower urinary tract symptoms: Secondary | ICD-10-CM

## 2011-12-29 DIAGNOSIS — G6181 Chronic inflammatory demyelinating polyneuritis: Secondary | ICD-10-CM

## 2011-12-29 DIAGNOSIS — J99 Respiratory disorders in diseases classified elsewhere: Secondary | ICD-10-CM

## 2011-12-29 DIAGNOSIS — D869 Sarcoidosis, unspecified: Secondary | ICD-10-CM | POA: Diagnosis not present

## 2011-12-29 DIAGNOSIS — I10 Essential (primary) hypertension: Secondary | ICD-10-CM

## 2011-12-29 DIAGNOSIS — I2510 Atherosclerotic heart disease of native coronary artery without angina pectoris: Secondary | ICD-10-CM | POA: Diagnosis not present

## 2011-12-29 DIAGNOSIS — D72819 Decreased white blood cell count, unspecified: Secondary | ICD-10-CM

## 2011-12-29 DIAGNOSIS — D126 Benign neoplasm of colon, unspecified: Secondary | ICD-10-CM

## 2011-12-29 LAB — LIPID PANEL
Cholesterol: 151 mg/dL (ref 0–200)
HDL: 56.5 mg/dL (ref >39.00)
LDL Cholesterol: 81 mg/dL (ref 0–99)
Total CHOL/HDL Ratio: 3
Triglycerides: 69 mg/dL (ref 0.0–149.0)
VLDL: 13.8 mg/dL (ref 0.0–40.0)

## 2011-12-29 LAB — HEPATIC FUNCTION PANEL
ALT: 38 U/L (ref 0–53)
AST: 29 U/L (ref 0–37)
Albumin: 4.6 g/dL (ref 3.5–5.2)
Alkaline Phosphatase: 60 U/L (ref 39–117)
Bilirubin, Direct: 0.1 mg/dL (ref 0.0–0.3)
Total Bilirubin: 0.6 mg/dL (ref 0.3–1.2)
Total Protein: 7.4 g/dL (ref 6.0–8.3)

## 2011-12-29 MED ORDER — FLUTICASONE PROPIONATE 50 MCG/ACT NA SUSP: 1.0000 | Freq: Two times a day (BID) | NASAL | Status: DC

## 2011-12-29 MED ORDER — AMLODIPINE BESYLATE 10 MG PO TABS: 10.0000 mg | ORAL_TABLET | Freq: Every day | ORAL | Status: DC

## 2011-12-29 MED ORDER — TRIAMTERENE-HCTZ 37.5-25 MG PO TABS: 1.0000 | ORAL_TABLET | Freq: Every day | ORAL | Status: DC

## 2011-12-29 MED ORDER — BUDESONIDE 180 MCG/ACT IN AEPB: 1.0000 | INHALATION_SPRAY | Freq: Two times a day (BID) | RESPIRATORY_TRACT | Status: DC

## 2011-12-29 MED ORDER — GABAPENTIN 800 MG PO TABS: 800.0000 mg | ORAL_TABLET | Freq: Three times a day (TID) | ORAL | Status: DC

## 2011-12-29 NOTE — Progress Notes (Signed)
  Subjective:    Patient ID: Derrick [name], male    DOB: [birth year], 71 y.o.   MRN: [id number]  HPI Derrick [name] has several concerns: He is due for lipid monitoring with last study in June '12 - LDL 115 in patient s/p PCI.  He has a problem with increase nasal drainage with a bloody mucus and sinus congestion. He has seen a worsening with new pillow cushion for CPAP. He has used fluticasone in the past. He has no signs or symptoms of infection.  Past Medical History  Diagnosis Date  . BENIGN PROSTATIC HYPERTROPHY, WITH OBSTRUCTION 05/28/2010  . COLONIC POLYPS, ADENOMATOUS, HX OF 05/28/2010  . GENERALIZED OSTEOARTHROSIS UNSPECIFIED SITE 05/28/2010  . GERD 05/28/2010  . HYPERLIPIDEMIA 05/28/2010  . Intrinsic asthma, 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  . Malignant neoplasm of descending colon 05/28/2010  . MITRAL VALVE PROLAPSE 05/28/2010  . 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 PALPITATIONS, CHRONIC 05/28/2010  . SLEEP APNEA, OBSTRUCTIVE, MODERATE 05/28/2010  . UNSPECIFIED INFLAMMATORY AND TOXIC NEUROPATHY 05/28/2010  . VITAMIN D DEFICIENCY 05/28/2010  . CAD (coronary artery disease)     a. s/p NSTEMI 06/01/11: DES to RCA;  b. cath 06/25/11:   dLM 50-60% (FFR 0.87), prox to mid LAD 40-50%, D1 50%, pCFX 50%, RCA stent ok, dPDA 80%, EF 55-60%.  His FFR was felt to be negative and therefore medical therapy was recommended ;  echo 6/12: EF 55-60%, mild AS   . 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 Coronary artery disease 12/11/2011  . MI (myocardial infarction) 12/11/2011   Past Surgical History  Procedure Date  . Tonsillectomy   . Colon surgery     decending colon 200  . Lung wedge 2001  . Appendectomy 1954   Family History  Problem Relation Age of Onset  . Arthritis Mother     severe  . Coronary artery disease Mother   . Coronary artery disease Brother    History   Social History  . Marital Status: Married    Spouse Name: N/A    Number of Children: N/A  . Years of Education: N/A    Occupational History  . Not on file.   Social History Main Topics  . Smoking status: Never Smoker   . Smokeless tobacco: Not on file  . Alcohol Use: No  . Drug Use: No  . Sexually Active:    Other Topics Concern  . Not on file   Social History Narrative   College- [employer]; [employer]-environment science and mgt. Married "38" 1 son- 9; 1 dtr "63" 2 grandchildren. 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 End of life-provided discussive context and provided packet.       Review of Systems System review is negative for any constitutional, cardiac, pulmonary, GI or neuro symptoms or complaints other than as described in the HPI.     Objective:   Physical Exam Vitals reviewed- normal Gen'l- WNWD white man who is in no distress C&S clear, EOMI, nose w/o erythema or visible drainage. Pulm - normal respirations Cor - mechanical valve click, regular Neuro - A&O x 3, cognition normal, CN normal, MS normal,         Assessment & Plan:

## 2011-12-29 NOTE — Progress Notes (Signed)
OFFICE PROGRESS NOTE  Interval history:  [name] is a 71 year old man with a chronic inflammatory demyelinating polyneuropathy. He began a trial of Rituxan approximately 2010. The Rituxan is being given on a 2 month schedule. He has had symptomatic relief and functional improvement since beginning Rituxan. He last received Rituxan on 12/13/2011. He is seen today for scheduled followup.  [name] reports that overall he feels well. He continues to note improvement in the painful neuropathy. He specifically notes that his ability to ambulate has improved. No interim illnesses or infections. No fevers or sweats. No shortness of breath or cough. He has occasional pain at the right hip and right knee.   Objective: Blood pressure 141/78, pulse 58, temperature 97.2 F (36.2 C), temperature source Oral, height 5\' 10"  (1.778 m), weight 205 lb 14.4 oz (93.396 kg).  Oropharynx is without thrush or ulceration. No palpable cervical, supraventricular or axillary lymph nodes. Lungs are clear. No wheezes or rales. Regular cardiac rhythm. Abdomen is soft and nontender. No organomegaly. Extremities are without edema. Motor strength is 5 over 5. DTRs are absent symmetrically.   Lab Results: Lab Results  Component Value Date   WBC 4.6 12/13/2011   HGB 13.7 12/13/2011   HCT 39.5 12/13/2011   MCV 89.4 12/13/2011   PLT 178 12/13/2011    Chemistry:    Chemistry      Component Value Date/Time   NA 139 12/13/2011 0834   NA 139 12/13/2011 0834   NA 139 12/13/2011 0834   NA 139 12/13/2011 0834   K 4.2 12/13/2011 0834   K 4.2 12/13/2011 0834   K 4.2 12/13/2011 0834   K 4.2 12/13/2011 0834   CL 103 12/13/2011 0834   CL 103 12/13/2011 0834   CL 103 12/13/2011 0834   CL 103 12/13/2011 0834   CO2 28 12/13/2011 0834   CO2 28 12/13/2011 0834   CO2 28 12/13/2011 0834   CO2 28 12/13/2011 0834   BUN 15 12/13/2011 0834   BUN 15 12/13/2011 0834   BUN 15 12/13/2011 0834   BUN 15 12/13/2011 0834   CREATININE  0.72 12/13/2011 0834   CREATININE 0.72 12/13/2011 0834   CREATININE 0.72 12/13/2011 0834   CREATININE 0.72 12/13/2011 0834   CREATININE 0.87 07/30/2011 0733   CREATININE 0.87 07/30/2011 0733   CREATININE 0.87 07/30/2011 0733      Component Value Date/Time   CALCIUM 10.1 12/13/2011 0834   CALCIUM 10.1 12/13/2011 0834   CALCIUM 10.1 12/13/2011 0834   CALCIUM 10.1 12/13/2011 0834   ALKPHOS 60 12/29/2011 1112   AST 29 12/29/2011 1112   ALT 38 12/29/2011 1112   BILITOT 0.6 12/29/2011 1112    LDH normal at 173 on 12/13/2011 IgM level stable at 323; serum IFE with monoclonal IgM lambda protein Serum kappa free light chains normal at 1.89, serum lambda free light chains normal at 1.16  Studies/Results: Dg Bone Survey Met  12/13/2011  *RADIOLOGY REPORT*  Clinical Data: Monoclonal paraproteinemia, pain in the low back and hips  METASTATIC BONE SURVEY  Comparison: None.  Findings: A lateral view of the skull shows no radiolucent lesion.  Two views of the cervical spine showed diffuse degenerative spurring.  No fracture is seen.  No compression deformity is noted.  Two views of the thoracic spine show diffuse degenerative change consistent with diffuse idiopathic skeletal hyperostosis.  No compression deformity is seen.  Two views of the lumbar spine show diffuse degenerative change with some degenerative disc disease.  No compression deformity is seen.  A view of the pelvis shows no definite radiolucent lesion.  There is vague sclerotic area within the right iliac wing superiorly which could be due to overlapping material within the bowel versus a bony lesion.  The SI joints appear normal.  Both hips are normally positioned.  Views of both femurs and hips show no radiolucent lesion.  Mild degenerative change is present in the hips.  Views of the right shoulder show degenerative change.  However no radiolucent lesion is seen.  The right humerus also appears unremarkable.  Views of the left shoulder show degenerative  change.  No radiolucent lesion is seen within the left shoulder or the left humerus.  IMPRESSION:  1.  No radiolucent lesions are noted. 2.  Diffuse degenerative change.  No compression deformity of the vertebral bodies is seen. 3.  Sclerotic lesion in the superior right iliac bone.  Possibly density within overlying bowel versus sclerotic bony lesion.  Original Report Authenticated By: Juline Patch, M.D.    Medications: I have reviewed the patient's current medications.  Assessment/Plan:  1. Chronic inflammatory demyelinating polyneuropathy.  Dr. Cyndie Chime feels this is a manifestation of underlying sarcoidosis and not a primary hematologic malignancy.  He has had symptomatic relief and functional improvement on Rituxan 375 mg/sq m IV q.2 months. Plan to continue the same. 2. Biopsy-proven sarcoidosis of the lungs. 3. 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 4. Transient leukopenia likely allergic reaction now resolved. 5. Essential hypertension. 6. Benign prostatic hyperplasia. 7. Adenomatous polyps of the colon.   Disposition-[name] appears stable. Plan to continue Rituxan every 2 months. He will return for a followup visit with Dr. Cyndie Chime in 4 months. He will contact the office the interim with any problems.  Plan reviewed with Dr. Cyndie Chime.   Lonna Cobb ANP/GNP-BC

## 2011-12-29 NOTE — Telephone Encounter (Signed)
gv pt appt schedule for feb/april °

## 2011-12-30 ENCOUNTER — Other Ambulatory Visit: Payer: Self-pay | Admitting: *Deleted

## 2011-12-30 MED ORDER — BUDESONIDE 180 MCG/ACT IN AEPB: 2.0000 | INHALATION_SPRAY | Freq: Two times a day (BID) | RESPIRATORY_TRACT | Status: DC

## 2011-12-30 MED ORDER — FLUTICASONE PROPIONATE 50 MCG/ACT NA SUSP: 2.0000 | Freq: Two times a day (BID) | NASAL | Status: DC

## 2011-12-30 NOTE — Telephone Encounter (Signed)
Pt needs rx for Pulmicort changed to 2 puffs bid as well as rx for Flonase. Wants rx sent to [facility] on [address]. Rx sent, pt informed.

## 2011-12-31 ENCOUNTER — Telehealth: Payer: Self-pay

## 2011-12-31 MED ORDER — CELECOXIB 200 MG PO CAPS: 200.0000 mg | ORAL_CAPSULE | Freq: Every day | ORAL | Status: DC

## 2011-12-31 NOTE — Telephone Encounter (Signed)
Ok for celebrex 200 mg q d, # 30, refill 11

## 2011-12-31 NOTE — Assessment & Plan Note (Signed)
Lab Results  Component Value Date   CHOL 151 12/29/2011   HDL 56.50 12/29/2011   LDLCALC 81 12/29/2011   TRIG 69.0 12/29/2011   CHOLHDL 3 12/29/2011   Good control on present regimen. No changes

## 2011-12-31 NOTE — Telephone Encounter (Signed)
Pt called stating he was told to call back if Aleve helped with his knee pain because [facility]EN would Rx Celebrex. Pt is requesting this Rx.  Pt aware MD out of office until 01/07 and is willing to wait.

## 2012-01-03 ENCOUNTER — Encounter: Payer: Self-pay | Admitting: Internal Medicine

## 2012-01-04 ENCOUNTER — Telehealth: Payer: Self-pay | Admitting: Cardiovascular Disease

## 2012-01-04 NOTE — Telephone Encounter (Signed)
There is some increase in risk of bleeding and a slight increase risk of cardiac events. However, the patient is receiving significant benefit I think the overall risk is low and it is reasonable to continue. Note that he is taking protonix and this should provide GI protection. He should remain on dual antiplatelet therapy with aspirin and Plavix.

## 2012-01-04 NOTE — Telephone Encounter (Signed)
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  The pt saw his PCP Dr Debby Bud and he recommended that the pt take Aleve just one day and see if he noticed any improvement in his pain.  The pt did take Aleve and he felt much better.  Dr Debby Bud started the pt on Celebrex 200mg  daily and the pt has had significant improvement in his symptoms and has been able to walk 30 minutes every day for exercise.  The pt was reading the drug information about Celebrex and wanted to know if Dr Excell Seltzer is okay with him taking this medication.  I will review this information with Dr Excell Seltzer and call the patient back with an answer.

## 2012-01-04 NOTE — Telephone Encounter (Signed)
Pt aware of Dr Cooper's recommendation.  

## 2012-01-04 NOTE — Telephone Encounter (Signed)
New problem:  Patient calling primary care prescribe celebrex 200 mg. Has question

## 2012-01-24 ENCOUNTER — Telehealth: Payer: Self-pay | Admitting: *Deleted

## 2012-01-24 NOTE — Telephone Encounter (Signed)
Request 90-day supply for Celebrex [prescribed #30x11]; called pharmacy and gave verbal for #90x3. Patient informed.

## 2012-01-25 ENCOUNTER — Telehealth: Payer: Self-pay | Admitting: *Deleted

## 2012-01-25 NOTE — Telephone Encounter (Signed)
Patient called and left voice message stating he received a 30 day supply for Celebrex and would like to know if he could get a Rx for 90 days

## 2012-01-25 NOTE — Telephone Encounter (Signed)
Ok for 90 day supply of celebrex

## 2012-01-26 MED ORDER — CELECOXIB 200 MG PO CAPS: 200.0000 mg | ORAL_CAPSULE | Freq: Every day | ORAL | Status: DC

## 2012-01-26 NOTE — Telephone Encounter (Signed)
Rx quantity change sent to pharmacy.

## 2012-02-09 DIAGNOSIS — H40059 Ocular hypertension, unspecified eye: Secondary | ICD-10-CM | POA: Diagnosis not present

## 2012-02-09 DIAGNOSIS — H25019 Cortical age-related cataract, unspecified eye: Secondary | ICD-10-CM | POA: Diagnosis not present

## 2012-02-09 DIAGNOSIS — H35719 Central serous chorioretinopathy, unspecified eye: Secondary | ICD-10-CM | POA: Diagnosis not present

## 2012-02-10 ENCOUNTER — Ambulatory Visit (INDEPENDENT_AMBULATORY_CARE_PROVIDER_SITE_OTHER): Payer: Medicare Other | Admitting: Cardiovascular Disease

## 2012-02-10 DIAGNOSIS — I2510 Atherosclerotic heart disease of native coronary artery without angina pectoris: Secondary | ICD-10-CM | POA: Diagnosis not present

## 2012-02-10 NOTE — Progress Notes (Signed)
Addended by: Iona Coach on: 02/10/2012 10:09 AM   Modules accepted: Orders

## 2012-02-10 NOTE — Patient Instructions (Signed)
Your physician wants you to follow-up in: 6 MONTHS. You will receive a reminder letter in the mail two months in advance. If you don't receive a letter, please call our office to schedule the follow-up appointment.  Your physician recommends that you return for a FASTING LIPID and LIVER Profile in 6 MONTHS--nothing to eat or drink after midnight, lab opens at 8:30 (please have these labs drawn prior to appointment)  Your physician recommends that you continue on your current medications as directed. Please refer to the Current Medication list given to you today.

## 2012-02-10 NOTE — Progress Notes (Signed)
Exercise Treadmill Test  Pre-Exercise Testing Evaluation Rhythm: normal sinus  Rate: 60   PR:  26 QRS:  .08  QT:  .38 QTc: .38     Test  Exercise Tolerance Test Ordering MD: Tonny Bollman, MD  Interpreting MD:  Tonny Bollman, MD  Unique Test No: 1  Treadmill:  1  Indication for ETT: known ASHD  Contraindication to ETT: No   Stress Modality: exercise - treadmill  Cardiac Imaging Performed: non   Protocol: standard Bruce - maximal  Max BP    159/78  Max MPHR (bpm):  152 85% MPR (bpm):  129  MPHR obtained (bpm):  136 % MPHR obtained:  90%  Reached 85% MPHR (min:sec):  7:40 Total Exercise Time (min-sec):  9:00  Workload in METS:  10.1 Borg Scale: 14  Reason ETT Terminated:  desired heart rate attained    ST Segment Analysis At Rest: normal ST segments - no evidence of significant ST depression With Exercise: non-specific ST changes  Other Information Arrhythmia:  No Angina during ETT:  absent (0) Quality of ETT:  diagnostic  ETT Interpretation:  normal - no evidence of ischemia by ST analysis  Comments: Good exercise tolerance, much improved from previous. No angina or significant ST changes with exertion.   Recommendations: Continue exercise program. Stop isosorbide. Will f/u in 6 months.

## 2012-02-11 DIAGNOSIS — G4733 Obstructive sleep apnea (adult) (pediatric): Secondary | ICD-10-CM | POA: Diagnosis not present

## 2012-02-11 DIAGNOSIS — G6181 Chronic inflammatory demyelinating polyneuritis: Secondary | ICD-10-CM | POA: Diagnosis not present

## 2012-02-11 DIAGNOSIS — G6189 Other inflammatory polyneuropathies: Secondary | ICD-10-CM | POA: Diagnosis not present

## 2012-02-11 DIAGNOSIS — D869 Sarcoidosis, unspecified: Secondary | ICD-10-CM | POA: Diagnosis not present

## 2012-02-11 DIAGNOSIS — G622 Polyneuropathy due to other toxic agents: Secondary | ICD-10-CM | POA: Diagnosis not present

## 2012-02-13 ENCOUNTER — Other Ambulatory Visit: Payer: Self-pay | Admitting: Oncology

## 2012-02-13 DIAGNOSIS — G619 Inflammatory polyneuropathy, unspecified: Secondary | ICD-10-CM

## 2012-02-13 DIAGNOSIS — G622 Polyneuropathy due to other toxic agents: Secondary | ICD-10-CM

## 2012-02-15 ENCOUNTER — Ambulatory Visit (HOSPITAL_BASED_OUTPATIENT_CLINIC_OR_DEPARTMENT_OTHER): Payer: Medicare Other

## 2012-02-15 ENCOUNTER — Other Ambulatory Visit: Payer: Medicare Other | Admitting: Lab

## 2012-02-15 DIAGNOSIS — Z5112 Encounter for antineoplastic immunotherapy: Secondary | ICD-10-CM

## 2012-02-15 DIAGNOSIS — G622 Polyneuropathy due to other toxic agents: Secondary | ICD-10-CM | POA: Diagnosis not present

## 2012-02-15 DIAGNOSIS — G9009 Other idiopathic peripheral autonomic neuropathy: Secondary | ICD-10-CM | POA: Diagnosis not present

## 2012-02-15 DIAGNOSIS — I10 Essential (primary) hypertension: Secondary | ICD-10-CM | POA: Diagnosis not present

## 2012-02-15 DIAGNOSIS — D869 Sarcoidosis, unspecified: Secondary | ICD-10-CM | POA: Diagnosis not present

## 2012-02-15 DIAGNOSIS — D472 Monoclonal gammopathy: Secondary | ICD-10-CM

## 2012-02-15 DIAGNOSIS — G619 Inflammatory polyneuropathy, unspecified: Secondary | ICD-10-CM

## 2012-02-15 DIAGNOSIS — I2510 Atherosclerotic heart disease of native coronary artery without angina pectoris: Secondary | ICD-10-CM | POA: Diagnosis not present

## 2012-02-15 LAB — CBC WITH DIFFERENTIAL/PLATELET
BASO%: 0.8 % (ref 0.0–2.0)
Basophils Absolute: 0 10*3/uL (ref 0.0–0.1)
EOS%: 4.7 % (ref 0.0–7.0)
Eosinophils Absolute: 0.2 10*3/uL (ref 0.0–0.5)
HCT: 39.5 % (ref 38.4–49.9)
HGB: 13.9 g/dL (ref 13.0–17.1)
LYMPH%: 18.9 % (ref 14.0–49.0)
MCH: 31.3 pg (ref 27.2–33.4)
MCHC: 35.2 g/dL (ref 32.0–36.0)
MCV: 89 fL (ref 79.3–98.0)
MONO#: 0.7 10*3/uL (ref 0.1–0.9)
MONO%: 13.8 % (ref 0.0–14.0)
NEUT#: 3 10*3/uL (ref 1.5–6.5)
NEUT%: 61.8 % (ref 39.0–75.0)
Platelets: 196 10*3/uL (ref 140–400)
RBC: 4.44 10*6/uL (ref 4.20–5.82)
RDW: 12.8 % (ref 11.0–14.6)
WBC: 4.9 10*3/uL (ref 4.0–10.3)
lymph#: 0.9 10*3/uL (ref 0.9–3.3)

## 2012-02-15 MED ORDER — ACETAMINOPHEN 325 MG PO TABS
650.0000 mg | ORAL_TABLET | Freq: Once | ORAL | Status: AC
Start: 2012-02-15 — End: 2012-02-15
  Administered 2012-02-15: 650 mg via ORAL

## 2012-02-15 MED ORDER — DIPHENHYDRAMINE HCL 25 MG PO CAPS
50.0000 mg | ORAL_CAPSULE | Freq: Once | ORAL | Status: AC
  Administered 2012-02-15: 50 mg via ORAL

## 2012-02-15 MED ORDER — SODIUM CHLORIDE 0.9 % IV SOLN
375.0000 mg/m2 | Freq: Once | INTRAVENOUS | Status: AC
  Administered 2012-02-15: 800 mg via INTRAVENOUS
  Filled 2012-02-15: qty 80

## 2012-02-15 MED ORDER — SODIUM CHLORIDE 0.9 % IV SOLN
Freq: Once | INTRAVENOUS | Status: AC
  Administered 2012-02-15: 09:00:00 via INTRAVENOUS

## 2012-02-15 NOTE — Progress Notes (Signed)
vss rituxan increased to 200 mg/hr = 83 ml/hr ,vol 42ml x 30  minutes

## 2012-02-15 NOTE — Progress Notes (Signed)
VSS, Rituxan rate increased to 125 for volume of 63

## 2012-02-15 NOTE — Progress Notes (Signed)
VSS. Increased to rate of 167 for volume of 83. Max dose reached.

## 2012-02-15 NOTE — Progress Notes (Signed)
VSS, 30 min past max dose.

## 2012-02-15 NOTE — Patient Instructions (Signed)
[facility] Discharge Instructions for Patients Receiving Chemotherapy  Today you received the following chemotherapy agents Rituxan.   To help prevent nausea and vomiting after your treatment, we encourage you to take your nausea medication as prescribed by your physician.  If you develop nausea and vomiting that is not controlled by your nausea medication, call the clinic. If it is after clinic hours your family physician or the after hours number for the clinic or go to the Emergency Department.   BELOW ARE SYMPTOMS THAT SHOULD BE REPORTED IMMEDIATELY:  *FEVER GREATER THAN 100.5 F  *CHILLS WITH OR WITHOUT FEVER  NAUSEA AND VOMITING THAT IS NOT CONTROLLED WITH YOUR NAUSEA MEDICATION  *UNUSUAL SHORTNESS OF BREATH  *UNUSUAL BRUISING OR BLEEDING  TENDERNESS IN MOUTH AND THROAT WITH OR WITHOUT PRESENCE OF ULCERS  *URINARY PROBLEMS  *BOWEL PROBLEMS  UNUSUAL RASH Items with * indicate a potential emergency and should be followed up as soon as possible.  Feel free to call the clinic you have any questions or concerns. The clinic phone number is [phone].   I have been informed and understand all the instructions given to me. I know to contact the clinic, my physician, or go to the Emergency Department if any problems should occur. I do not have any questions at this time, but understand that I may call the clinic during office hours   should I have any questions or need assistance in obtaining follow up care.    __________________________________________  _____________  __________ Signature of Patient or Authorized Representative            Date                   Time    __________________________________________ Nurse's Signature

## 2012-02-16 LAB — IGM: IgM, Serum: 275 mg/dL — ABNORMAL HIGH (ref 41–251)

## 2012-02-21 ENCOUNTER — Ambulatory Visit (INDEPENDENT_AMBULATORY_CARE_PROVIDER_SITE_OTHER): Payer: Medicare Other | Admitting: Internal Medicine

## 2012-02-21 ENCOUNTER — Encounter: Payer: Self-pay | Admitting: Internal Medicine

## 2012-02-21 DIAGNOSIS — J309 Allergic rhinitis, unspecified: Secondary | ICD-10-CM | POA: Diagnosis not present

## 2012-02-21 NOTE — Assessment & Plan Note (Signed)
Exacerbation of symptoms with difficulty using CPAP. No evidence of infection.  Plan - trial of nasal saline followed by afrin spray followed by QNasl 2 sprays to each nostril           For lack of efficacy will refer to ENT to r/o anatomic obstruction.

## 2012-02-21 NOTE — Patient Instructions (Signed)
Symptoms are strongly suggestive of allergic rhinitis -chronic.  Plan - use the saline irrigation to clear out followed by afrin spray to shrink membranes followed by Qnasl - 2 prays to each nostril once a day. If this doesn't work I recommend seeing an ENT to evaluate for anatomic obstruction of the nasal passage or sinuses.

## 2012-02-21 NOTE — Progress Notes (Signed)
  Subjective:    Patient ID: Derrick [name], male    DOB: December 25, 1941, 71 y.o.   MRN: [id number]  HPI Mr. Vought presents with chronic, more than 3 months, of sinus congestion. He does use saline irrigation and gets out copious amounts of mucus with blood. He has had no fever or chills or other signs of bacterial infection. This has not responded in the past to flonase nasal spray. He does have OSA using a nasal cushion system but his congestion interferes with use of his CPAP machine. He has never had ENT consult.  Past Medical History  Diagnosis Date  . BENIGN PROSTATIC HYPERTROPHY, WITH OBSTRUCTION 05/28/2010  . COLONIC POLYPS, ADENOMATOUS, HX OF 05/28/2010  . GENERALIZED OSTEOARTHROSIS UNSPECIFIED SITE 05/28/2010  . GERD 05/28/2010  . HYPERLIPIDEMIA 05/28/2010  . Intrinsic asthma, 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  . Malignant neoplasm of descending colon 05/28/2010  . MITRAL VALVE PROLAPSE 05/28/2010  . 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 PALPITATIONS, CHRONIC 05/28/2010  . SLEEP APNEA, OBSTRUCTIVE, MODERATE 05/28/2010  . UNSPECIFIED INFLAMMATORY AND TOXIC NEUROPATHY 05/28/2010  . VITAMIN D DEFICIENCY 05/28/2010  . CAD (coronary artery disease)     a. s/p NSTEMI 06/01/11: DES to RCA;  b. cath 06/25/11:   dLM 50-60% (FFR 0.87), prox to mid LAD 40-50%, D1 50%, pCFX 50%, RCA stent ok, dPDA 80%, EF 55-60%.  His FFR was felt to be negative and therefore medical therapy was recommended ;  echo 6/12: EF 55-60%, mild AS   . 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 Coronary artery disease 12/11/2011  . MI (myocardial infarction) 12/11/2011   Past Surgical History  Procedure Date  . Tonsillectomy   . Colon surgery     decending colon 200  . Lung wedge 2001  . Appendectomy 1954   Family History  Problem Relation Age of Onset  . Arthritis Mother     severe  . Coronary artery disease Mother   . Coronary artery disease Brother    History   Social History  . Marital Status: Married    Spouse Name: N/A      Number of Children: N/A  . Years of Education: N/A   Occupational History  . Not on file.   Social History Main Topics  . Smoking status: Never Smoker   . Smokeless tobacco: Not on file  . Alcohol Use: No  . Drug Use: No  . Sexually Active:    Other Topics Concern  . Not on file   Social History Narrative   College- [employer]; [employer]-environment science and mgt. Married "43" 1 son- 58; 1 dtr "64" 2 grandchildren. 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 End of life-provided discussive context and provided packet.       Review of Systems System review is negative for any constitutional, cardiac, pulmonary, GI or neuro symptoms or complaints other than as described in the HPI.     Objective:   Physical Exam Filed Vitals:   02/21/12 1159  BP: 138/84  Pulse: 59  Temp: 97.6 F (36.4 C)  Resp: 16   Gen'l - WNWD white man in no distress HEENT- no sinus tenderness to percussion Pulm - normal respirations Cor - RRR       Assessment & Plan:

## 2012-03-02 DIAGNOSIS — J019 Acute sinusitis, unspecified: Secondary | ICD-10-CM | POA: Diagnosis not present

## 2012-03-02 DIAGNOSIS — J301 Allergic rhinitis due to pollen: Secondary | ICD-10-CM | POA: Diagnosis not present

## 2012-04-07 ENCOUNTER — Other Ambulatory Visit: Payer: Self-pay | Admitting: Oncology

## 2012-04-10 DIAGNOSIS — H40059 Ocular hypertension, unspecified eye: Secondary | ICD-10-CM | POA: Diagnosis not present

## 2012-04-10 DIAGNOSIS — H35719 Central serous chorioretinopathy, unspecified eye: Secondary | ICD-10-CM | POA: Diagnosis not present

## 2012-04-11 ENCOUNTER — Ambulatory Visit: Payer: Medicare Other | Admitting: Oncology

## 2012-04-11 ENCOUNTER — Encounter: Payer: Self-pay | Admitting: Oncology

## 2012-04-11 ENCOUNTER — Ambulatory Visit (HOSPITAL_BASED_OUTPATIENT_CLINIC_OR_DEPARTMENT_OTHER): Payer: Medicare Other

## 2012-04-11 ENCOUNTER — Other Ambulatory Visit (HOSPITAL_BASED_OUTPATIENT_CLINIC_OR_DEPARTMENT_OTHER): Payer: Medicare Other | Admitting: Lab

## 2012-04-11 VITALS — BP 145/74 | HR 54 | Temp 98.3°F

## 2012-04-11 VITALS — BP 140/74 | HR 57 | Temp 97.1°F | Ht 70.0 in | Wt 196.8 lb

## 2012-04-11 DIAGNOSIS — G6181 Chronic inflammatory demyelinating polyneuritis: Secondary | ICD-10-CM | POA: Diagnosis not present

## 2012-04-11 DIAGNOSIS — D472 Monoclonal gammopathy: Secondary | ICD-10-CM

## 2012-04-11 DIAGNOSIS — G619 Inflammatory polyneuropathy, unspecified: Secondary | ICD-10-CM

## 2012-04-11 DIAGNOSIS — G622 Polyneuropathy due to other toxic agents: Secondary | ICD-10-CM

## 2012-04-11 DIAGNOSIS — D869 Sarcoidosis, unspecified: Secondary | ICD-10-CM

## 2012-04-11 DIAGNOSIS — Z5112 Encounter for antineoplastic immunotherapy: Secondary | ICD-10-CM

## 2012-04-11 HISTORY — DX: Chronic inflammatory demyelinating polyneuritis: G61.81

## 2012-04-11 LAB — CBC WITH DIFFERENTIAL/PLATELET
BASO%: 0.9 % (ref 0.0–2.0)
Basophils Absolute: 0.1 10*3/uL (ref 0.0–0.1)
EOS%: 3.5 % (ref 0.0–7.0)
Eosinophils Absolute: 0.2 10*3/uL (ref 0.0–0.5)
HCT: 40.9 % (ref 38.4–49.9)
HGB: 14.4 g/dL (ref 13.0–17.1)
LYMPH%: 17.3 % (ref 14.0–49.0)
MCH: 31.5 pg (ref 27.2–33.4)
MCHC: 35.2 g/dL (ref 32.0–36.0)
MCV: 89.5 fL (ref 79.3–98.0)
MONO#: 0.7 10*3/uL (ref 0.1–0.9)
MONO%: 12.9 % (ref 0.0–14.0)
NEUT#: 3.6 10*3/uL (ref 1.5–6.5)
NEUT%: 65.4 % (ref 39.0–75.0)
Platelets: 217 10*3/uL (ref 140–400)
RBC: 4.57 10*6/uL (ref 4.20–5.82)
RDW: 13 % (ref 11.0–14.6)
WBC: 5.4 10*3/uL (ref 4.0–10.3)
lymph#: 0.9 10*3/uL (ref 0.9–3.3)
nRBC: 0 % (ref 0–0)

## 2012-04-11 MED ORDER — ACETAMINOPHEN 325 MG PO TABS
650.0000 mg | ORAL_TABLET | Freq: Once | ORAL | Status: AC
  Administered 2012-04-11: 650 mg via ORAL

## 2012-04-11 MED ORDER — RITUXIMAB CHEMO INJECTION 10 MG/ML
375.0000 mg/m2 | Freq: Once | INTRAVENOUS | Status: AC
  Administered 2012-04-11: 800 mg via INTRAVENOUS
  Filled 2012-04-11: qty 80

## 2012-04-11 MED ORDER — DIPHENHYDRAMINE HCL 25 MG PO CAPS
50.0000 mg | ORAL_CAPSULE | Freq: Once | ORAL | Status: AC
  Administered 2012-04-11: 50 mg via ORAL

## 2012-04-11 MED ORDER — SODIUM CHLORIDE 0.9 % IV SOLN
Freq: Once | INTRAVENOUS | Status: AC
  Administered 2012-04-11: 12:00:00 via INTRAVENOUS

## 2012-04-11 NOTE — Patient Instructions (Signed)
[facility] Discharge Instructions for Patients Receiving Chemotherapy  Today you received the following chemotherapy agents Rituxan.  To help prevent nausea and vomiting after your treatment, we encourage you to take your nausea medication as ordered per MD.    If you develop nausea and vomiting that is not controlled by your nausea medication, call the clinic. If it is after clinic hours your family physician or the after hours number for the clinic or go to the Emergency Department.   BELOW ARE SYMPTOMS THAT SHOULD BE REPORTED IMMEDIATELY:  *FEVER GREATER THAN 100.5 F  *CHILLS WITH OR WITHOUT FEVER  NAUSEA AND VOMITING THAT IS NOT CONTROLLED WITH YOUR NAUSEA MEDICATION  *UNUSUAL SHORTNESS OF BREATH  *UNUSUAL BRUISING OR BLEEDING  TENDERNESS IN MOUTH AND THROAT WITH OR WITHOUT PRESENCE OF ULCERS  *URINARY PROBLEMS  *BOWEL PROBLEMS  UNUSUAL RASH Items with * indicate a potential emergency and should be followed up as soon as possible.   Please let the nurse know about any problems that you may have experienced. Feel free to call the clinic you have any questions or concerns. The clinic phone number is [phone].   I have been informed and understand all the instructions given to me. I know to contact the clinic, my physician, or go to the Emergency Department if any problems should occur. I do not have any questions at this time, but understand that I may call the clinic during office hours   should I have any questions or need assistance in obtaining follow up care.    __________________________________________  _____________  __________ Signature of Patient or Authorized Representative            Date                   Time    __________________________________________ Nurse's Signature    

## 2012-04-11 NOTE — Progress Notes (Unsigned)
1230-Rituxan started at 100mg /hr at 42 ml/hr for 30 minutes.

## 2012-04-12 ENCOUNTER — Telehealth: Payer: Self-pay | Admitting: *Deleted

## 2012-04-12 DIAGNOSIS — R972 Elevated prostate specific antigen [PSA]: Secondary | ICD-10-CM | POA: Diagnosis not present

## 2012-04-12 LAB — LACTATE DEHYDROGENASE: LDH: 155 U/L (ref 94–250)

## 2012-04-12 LAB — COMPREHENSIVE METABOLIC PANEL
ALT: 18 U/L (ref 0–53)
AST: 19 U/L (ref 0–37)
Albumin: 4.3 g/dL (ref 3.5–5.2)
Alkaline Phosphatase: 63 U/L (ref 39–117)
BUN: 20 mg/dL (ref 6–23)
CO2: 31 mEq/L (ref 19–32)
Calcium: 10.1 mg/dL (ref 8.4–10.5)
Chloride: 102 mEq/L (ref 96–112)
Creatinine, Ser: 0.79 mg/dL (ref 0.50–1.35)
Glucose, Bld: 124 mg/dL — ABNORMAL HIGH (ref 70–99)
Potassium: 4.1 mEq/L (ref 3.5–5.3)
Sodium: 140 mEq/L (ref 135–145)
Total Bilirubin: 0.7 mg/dL (ref 0.3–1.2)
Total Protein: 6.7 g/dL (ref 6.0–8.3)

## 2012-04-12 LAB — KAPPA/LAMBDA LIGHT CHAINS
Kappa free light chain: 1.47 mg/dL (ref 0.33–1.94)
Kappa:Lambda Ratio: 1.11 (ref 0.26–1.65)
Lambda Free Lght Chn: 1.32 mg/dL (ref 0.57–2.63)

## 2012-04-12 LAB — IGG, IGA, IGM
IgA: 140 mg/dL (ref 68–379)
IgG (Immunoglobin G), Serum: 810 mg/dL (ref 650–1600)
IgM, Serum: 317 mg/dL — ABNORMAL HIGH (ref 41–251)

## 2012-04-12 NOTE — Telephone Encounter (Signed)
I have called and left the patient a message with MD appts.  JMW

## 2012-04-12 NOTE — Progress Notes (Signed)
Hematology and Oncology Follow Up Visit  Derrick [name] [id number] [birth year] 71 y.o. 04/12/2012 10:47 AM   Principle Diagnosis: Encounter Diagnoses  Name Primary?  [name] CIDP (chronic inflammatory demyelinating polyneuropathy) Yes  . UNSPECIFIED INFLAMMATORY AND TOXIC NEUROPATHY   . 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 He was initially evaluated and treated by a hematologist and a neurologist in [place] before he moved to [place]. 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 Please see my previous progress notes for full details.  His neurologic condition is characterized primarily by painful distal neuropathy feet worse than hands and lower extremity weakness. At one point he could barely walk a block without having to stop due to  pain and weakness. He was given an initial trial of Rituxan in 2010 with no significant improvement. However following reinitiation of Rituxan in April 2012 he did get a progressive improvement. He was put on a maintenance program which we have continued in this office on an every other month basis. His endurance has definitely improved. He is now  walking 30 minutes a day without having to stop. Although he still has painful dysesthesias of his feet, symptoms have stabilized and not progressed. He tells me he feels the best now that he has in many years.  He has had no interim medical problems. He got through the winter without any infection.   Medications: reviewed  Allergies: No Known Allergies  Review of Systems: Constitutional:   No constitutional symptoms Respiratory: No cough or dyspnea Cardiovascular:  No chest pain or palpitations Gastrointestinal: No change in bowel  habit Genito-Urinary: No urinary tract symptoms Musculoskeletal: No muscle or bone pain Neurologic: See above Skin: No rash or ecchymosis Remaining ROS negative.  Physical Exam: Blood pressure 140/74, pulse 57, temperature 97.1 F (36.2 C), temperature source Oral, height 5\' 10"  (1.778 m), weight 196 lb 12.8 oz (89.268 kg). Wt Readings from Last 3 Encounters:  04/11/12 196 lb 12.8 oz (89.268 kg)  02/21/12 201 lb 8 oz (91.4 kg)  12/29/11 205 lb 14.4 oz (93.396 kg)     General appearance: Well-nourished Caucasian man HENNT: Pharynx no erythema or exudate Lymph nodes: No cervical supraclavicular or axillary adenopathy Breasts: Lungs: Clear to auscultation resonant to percussion Heart: Regular rhythm no murmur Abdomen: Soft nontender no mass no organomegaly Extremities: No edema no calf tenderness Vascular: No cyanosis Neurologic: Motor strength is 5 over 5. Reflexes absent symmetric at the knees 1+ symmetric at the biceps. Sensation moderately decreased to vibration by tuning fork exam over the fingertips. Feet not tested. Skin: No rash or ecchymosis  Lab Results: Lab Results  Component Value Date   WBC 5.4 04/11/2012   HGB 14.4 04/11/2012   HCT 40.9 04/11/2012   MCV 89.5 04/11/2012   PLT 217 04/11/2012     Chemistry      Component Value Date/Time   NA 140 04/11/2012 1016   K 4.1 04/11/2012 1016   CL 102 04/11/2012 1016   CO2 31 04/11/2012 1016   BUN 20 04/11/2012 1016   CREATININE 0.79 04/11/2012 1016   CREATININE 0.87 07/30/2011 0733   CREATININE 0.87 07/30/2011 0733   CREATININE 0.87 07/30/2011 0733      Component Value Date/Time   CALCIUM 10.1  04/11/2012 1016   ALKPHOS 63 04/11/2012 1016   AST 19 04/11/2012 1016   ALT 18 04/11/2012 1016   BILITOT 0.7 04/11/2012 1016       Impression and Plan: #1. CIDP Improved on intermittent Rituxan therapy. I told him that we really don't have any long-term data on maintenance Rituxan but if we use lymphoma treatment as a model, it would  be reasonable to continue the Rituxan out to 2 years and then give him a trial off the drug.  #2. Pulmonary sarcoidosis. I feel it is likely that the underlying immune deficit associated with his sarcoidosis may also be responsible for the CIDP. Of course there is no way to prove this.  #3. A monoclonal gammopathy of undetermined significance related to #1 and #2. IgM level has been stable on serial measurements with current value of 317 mg percent. (04/11/2012).  #4. Coronary artery disease status post MI status post right coronary drug-eluting stent  #5. Essential hypertension  #6. Benign prostatic hyperplasia  #7. Adenomatous polyps of the colon   CC:. Dr. Illene Regulus; Dr. Porfirio Mylar Dohmeier; Dr. Tonny Bollman; Dr. [name] fax [phone]   [name], MD 4/17/201310:47 AM

## 2012-04-14 ENCOUNTER — Telehealth: Payer: Self-pay | Admitting: Oncology

## 2012-04-14 NOTE — Telephone Encounter (Signed)
appts complete s/w pt today re next appt for 6/11 and pt will get schedule when he comes in.

## 2012-04-17 ENCOUNTER — Other Ambulatory Visit: Payer: Self-pay | Admitting: Certified Registered Nurse Anesthetist

## 2012-04-21 DIAGNOSIS — R972 Elevated prostate specific antigen [PSA]: Secondary | ICD-10-CM | POA: Diagnosis not present

## 2012-04-21 DIAGNOSIS — N529 Male erectile dysfunction, unspecified: Secondary | ICD-10-CM | POA: Diagnosis not present

## 2012-04-21 DIAGNOSIS — N401 Enlarged prostate with lower urinary tract symptoms: Secondary | ICD-10-CM | POA: Diagnosis not present

## 2012-05-12 ENCOUNTER — Telehealth: Payer: Self-pay | Admitting: Cardiovascular Disease

## 2012-05-12 NOTE — Telephone Encounter (Signed)
I spoke with the pt and his Urologist (Dr Laurence Compton) would like to prescribe Cialis 5mg  on a daily basis due to urinary tract blockage and ED.  The pt has stopped Isosorbide and does not use SL NTG. The pt would like to make sure that using Cialis is okay with Dr Excell Seltzer.  I will forward this message to Dr Excell Seltzer for review.

## 2012-05-12 NOTE — Telephone Encounter (Signed)
New msg Pt is starting to take cialis 5 mg per day and wanted to talk to you about this med.

## 2012-05-12 NOTE — Telephone Encounter (Signed)
That's fine from a CV perspective. thx

## 2012-05-16 NOTE — Telephone Encounter (Signed)
I spoke with the pt and made him aware of Dr Earmon Phoenix recommendation.  I did make the pt aware that he should not use SL NTG while taking Cialis. If the pt is having CP he will contact EMS.

## 2012-06-01 ENCOUNTER — Other Ambulatory Visit: Payer: Self-pay | Admitting: Oncology

## 2012-06-02 ENCOUNTER — Telehealth: Payer: Self-pay | Admitting: Cardiovascular Disease

## 2012-06-02 NOTE — Telephone Encounter (Signed)
Close  

## 2012-06-06 ENCOUNTER — Other Ambulatory Visit (HOSPITAL_BASED_OUTPATIENT_CLINIC_OR_DEPARTMENT_OTHER): Payer: Medicare Other

## 2012-06-06 ENCOUNTER — Ambulatory Visit (HOSPITAL_BASED_OUTPATIENT_CLINIC_OR_DEPARTMENT_OTHER): Payer: Medicare Other

## 2012-06-06 VITALS — BP 104/60 | HR 54 | Temp 97.7°F

## 2012-06-06 DIAGNOSIS — G619 Inflammatory polyneuropathy, unspecified: Secondary | ICD-10-CM

## 2012-06-06 DIAGNOSIS — G6181 Chronic inflammatory demyelinating polyneuritis: Secondary | ICD-10-CM | POA: Diagnosis not present

## 2012-06-06 DIAGNOSIS — G622 Polyneuropathy due to other toxic agents: Secondary | ICD-10-CM

## 2012-06-06 DIAGNOSIS — Z5112 Encounter for antineoplastic immunotherapy: Secondary | ICD-10-CM

## 2012-06-06 DIAGNOSIS — D472 Monoclonal gammopathy: Secondary | ICD-10-CM | POA: Diagnosis not present

## 2012-06-06 DIAGNOSIS — D869 Sarcoidosis, unspecified: Secondary | ICD-10-CM

## 2012-06-06 LAB — CBC & DIFF AND RETIC
BASO%: 0.7 % (ref 0.0–2.0)
Basophils Absolute: 0 10*3/uL (ref 0.0–0.1)
EOS%: 2.5 % (ref 0.0–7.0)
Eosinophils Absolute: 0.1 10*3/uL (ref 0.0–0.5)
HCT: 41.5 % (ref 38.4–49.9)
HGB: 14.9 g/dL (ref 13.0–17.1)
Immature Retic Fract: 3.4 % (ref 3.00–10.60)
LYMPH%: 15.6 % (ref 14.0–49.0)
MCH: 31.6 pg (ref 27.2–33.4)
MCHC: 35.9 g/dL (ref 32.0–36.0)
MCV: 88.1 fL (ref 79.3–98.0)
MONO#: 0.8 10*3/uL (ref 0.1–0.9)
MONO%: 14 % (ref 0.0–14.0)
NEUT#: 3.7 10*3/uL (ref 1.5–6.5)
NEUT%: 67.2 % (ref 39.0–75.0)
Platelets: 200 10*3/uL (ref 140–400)
RBC: 4.71 10*6/uL (ref 4.20–5.82)
RDW: 12.7 % (ref 11.0–14.6)
Retic %: 1.52 % (ref 0.80–1.80)
Retic Ct Abs: 71.59 10*3/uL (ref 34.80–93.90)
WBC: 5.5 10*3/uL (ref 4.0–10.3)
lymph#: 0.9 10*3/uL (ref 0.9–3.3)
nRBC: 0 % (ref 0–0)

## 2012-06-06 MED ORDER — ACETAMINOPHEN 325 MG PO TABS
650.0000 mg | ORAL_TABLET | Freq: Once | ORAL | Status: AC
  Administered 2012-06-06: 650 mg via ORAL

## 2012-06-06 MED ORDER — DIPHENHYDRAMINE HCL 25 MG PO CAPS
50.0000 mg | ORAL_CAPSULE | Freq: Once | ORAL | Status: AC
  Administered 2012-06-06: 50 mg via ORAL

## 2012-06-06 MED ORDER — RITUXIMAB CHEMO INJECTION 10 MG/ML
375.0000 mg/m2 | Freq: Once | INTRAVENOUS | Status: AC
  Administered 2012-06-06: 800 mg via INTRAVENOUS
  Filled 2012-06-06: qty 80

## 2012-06-06 MED ORDER — SODIUM CHLORIDE 0.9 % IV SOLN
Freq: Once | INTRAVENOUS | Status: AC
  Administered 2012-06-06: 12:00:00 via INTRAVENOUS

## 2012-06-06 NOTE — Patient Instructions (Signed)
[facility] Discharge Instructions for Patients Receiving Chemotherapy  Today you received the following chemotherapy agent Rituxan.  To help prevent nausea and vomiting after your treatment, we encourage you to take your nausea medication. Begin taking it as often as prescribed for by Dr. Cyndie Chime.    If you develop nausea and vomiting that is not controlled by your nausea medication, call the clinic. If it is after clinic hours your family physician or the after hours number for the clinic or go to the Emergency Department.   BELOW ARE SYMPTOMS THAT SHOULD BE REPORTED IMMEDIATELY:  *FEVER GREATER THAN 100.5 F  *CHILLS WITH OR WITHOUT FEVER  NAUSEA AND VOMITING THAT IS NOT CONTROLLED WITH YOUR NAUSEA MEDICATION  *UNUSUAL SHORTNESS OF BREATH  *UNUSUAL BRUISING OR BLEEDING  TENDERNESS IN MOUTH AND THROAT WITH OR WITHOUT PRESENCE OF ULCERS  *URINARY PROBLEMS  *BOWEL PROBLEMS  UNUSUAL RASH Items with * indicate a potential emergency and should be followed up as soon as possible.  One of the nurses will contact you 24 hours after your treatment. Please let the nurse know about any problems that you may have experienced. Feel free to call the clinic you have any questions or concerns. The clinic phone number is [phone].   I have been informed and understand all the instructions given to me. I know to contact the clinic, my physician, or go to the Emergency Department if any problems should occur. I do not have any questions at this time, but understand that I may call the clinic during office hours   should I have any questions or need assistance in obtaining follow up care.    __________________________________________  _____________  __________ Signature of Patient or Authorized Representative            Date                   Time    __________________________________________ Nurse's Signature

## 2012-06-07 LAB — COMPREHENSIVE METABOLIC PANEL
ALT: 16 U/L (ref 0–53)
AST: 19 U/L (ref 0–37)
Albumin: 4.5 g/dL (ref 3.5–5.2)
Alkaline Phosphatase: 58 U/L (ref 39–117)
BUN: 18 mg/dL (ref 6–23)
CO2: 28 mEq/L (ref 19–32)
Calcium: 9.9 mg/dL (ref 8.4–10.5)
Chloride: 101 mEq/L (ref 96–112)
Creatinine, Ser: 0.82 mg/dL (ref 0.50–1.35)
Glucose, Bld: 83 mg/dL (ref 70–99)
Potassium: 4 mEq/L (ref 3.5–5.3)
Sodium: 140 mEq/L (ref 135–145)
Total Bilirubin: 0.7 mg/dL (ref 0.3–1.2)
Total Protein: 6.8 g/dL (ref 6.0–8.3)

## 2012-06-07 LAB — IGM: IgM, Serum: 321 mg/dL — ABNORMAL HIGH (ref 41–251)

## 2012-07-20 ENCOUNTER — Other Ambulatory Visit: Payer: Self-pay | Admitting: Cardiovascular Disease

## 2012-08-01 ENCOUNTER — Other Ambulatory Visit (HOSPITAL_BASED_OUTPATIENT_CLINIC_OR_DEPARTMENT_OTHER): Payer: Medicare Other | Admitting: Lab

## 2012-08-01 ENCOUNTER — Ambulatory Visit (HOSPITAL_BASED_OUTPATIENT_CLINIC_OR_DEPARTMENT_OTHER): Payer: Medicare Other

## 2012-08-01 VITALS — BP 118/64 | HR 56 | Temp 97.5°F | Resp 20

## 2012-08-01 DIAGNOSIS — D869 Sarcoidosis, unspecified: Secondary | ICD-10-CM

## 2012-08-01 DIAGNOSIS — D472 Monoclonal gammopathy: Secondary | ICD-10-CM

## 2012-08-01 DIAGNOSIS — G6181 Chronic inflammatory demyelinating polyneuritis: Secondary | ICD-10-CM

## 2012-08-01 DIAGNOSIS — Z5112 Encounter for antineoplastic immunotherapy: Secondary | ICD-10-CM | POA: Diagnosis not present

## 2012-08-01 DIAGNOSIS — G619 Inflammatory polyneuropathy, unspecified: Secondary | ICD-10-CM

## 2012-08-01 DIAGNOSIS — G622 Polyneuropathy due to other toxic agents: Secondary | ICD-10-CM

## 2012-08-01 LAB — CBC & DIFF AND RETIC
BASO%: 0.8 % (ref 0.0–2.0)
Basophils Absolute: 0 10*3/uL (ref 0.0–0.1)
EOS%: 4.3 % (ref 0.0–7.0)
Eosinophils Absolute: 0.2 10*3/uL (ref 0.0–0.5)
HCT: 40.2 % (ref 38.4–49.9)
HGB: 14.2 g/dL (ref 13.0–17.1)
Immature Retic Fract: 3.2 % (ref 3.00–10.60)
LYMPH%: 19.9 % (ref 14.0–49.0)
MCH: 30.8 pg (ref 27.2–33.4)
MCHC: 35.3 g/dL (ref 32.0–36.0)
MCV: 87.2 fL (ref 79.3–98.0)
MONO#: 0.5 10*3/uL (ref 0.1–0.9)
MONO%: 13.8 % (ref 0.0–14.0)
NEUT#: 2.3 10*3/uL (ref 1.5–6.5)
NEUT%: 61.2 % (ref 39.0–75.0)
Platelets: 188 10*3/uL (ref 140–400)
RBC: 4.61 10*6/uL (ref 4.20–5.82)
RDW: 13.2 % (ref 11.0–14.6)
Retic %: 1.43 % (ref 0.80–1.80)
Retic Ct Abs: 65.92 10*3/uL (ref 34.80–93.90)
WBC: 3.8 10*3/uL — ABNORMAL LOW (ref 4.0–10.3)
lymph#: 0.8 10*3/uL — ABNORMAL LOW (ref 0.9–3.3)
nRBC: 0 % (ref 0–0)

## 2012-08-01 MED ORDER — RITUXIMAB CHEMO INJECTION 10 MG/ML
375.0000 mg/m2 | Freq: Once | INTRAVENOUS | Status: AC
  Administered 2012-08-01: 800 mg via INTRAVENOUS
  Filled 2012-08-01: qty 80

## 2012-08-01 MED ORDER — SODIUM CHLORIDE 0.9 % IV SOLN
Freq: Once | INTRAVENOUS | Status: AC
  Administered 2012-08-01: 12:00:00 via INTRAVENOUS

## 2012-08-01 MED ORDER — DIPHENHYDRAMINE HCL 25 MG PO CAPS
50.0000 mg | ORAL_CAPSULE | Freq: Once | ORAL | Status: AC
  Administered 2012-08-01: 50 mg via ORAL

## 2012-08-01 MED ORDER — ACETAMINOPHEN 325 MG PO TABS
650.0000 mg | ORAL_TABLET | Freq: Once | ORAL | Status: AC
  Administered 2012-08-01: 650 mg via ORAL

## 2012-08-01 NOTE — Progress Notes (Signed)
Rituxan started at 1235.  100 mg/hr = 42 ml/hr x 21 ml. No complaints. Patient tolerated well.  1305 200 mg/hr= 83 ml/hr x 42 ml.  No complaints. Patient tolerated well.  1335 300 mg/hr = 125 ml/hr x 63 ml. No complaints. Patient tolerated well.  1405. 400 mg/hr = 167 ml/hr x 83 ml. No complaints. Patient tolerated well.

## 2012-08-01 NOTE — Patient Instructions (Signed)
[facility] Discharge Instructions for Patients Receiving Chemotherapy  Today you received the following chemotherapy agents Rituxan   To help prevent nausea and vomiting after your treatment, we encourage you to take your nausea medication as prescribed If you develop nausea and vomiting that is not controlled by your nausea medication, call the clinic. If it is after clinic hours your family physician or the after hours number for the clinic or go to the Emergency Department.   BELOW ARE SYMPTOMS THAT SHOULD BE REPORTED IMMEDIATELY:  *FEVER GREATER THAN 100.5 F  *CHILLS WITH OR WITHOUT FEVER  NAUSEA AND VOMITING THAT IS NOT CONTROLLED WITH YOUR NAUSEA MEDICATION  *UNUSUAL SHORTNESS OF BREATH  *UNUSUAL BRUISING OR BLEEDING  TENDERNESS IN MOUTH AND THROAT WITH OR WITHOUT PRESENCE OF ULCERS  *URINARY PROBLEMS  *BOWEL PROBLEMS  UNUSUAL RASH Items with * indicate a potential emergency and should be followed up as soon as possible.  One of the nurses will contact you 24 hours after your treatment. Please let the nurse know about any problems that you may have experienced. Feel free to call the clinic you have any questions or concerns. The clinic phone number is [phone].   I have been informed and understand all the instructions given to me. I know to contact the clinic, my physician, or go to the Emergency Department if any problems should occur. I do not have any questions at this time, but understand that I may call the clinic during office hours   should I have any questions or need assistance in obtaining follow up care.    __________________________________________  _____________  __________ Signature of Patient or Authorized Representative            Date                   Time    __________________________________________ Nurse's Signature    

## 2012-08-02 ENCOUNTER — Other Ambulatory Visit (INDEPENDENT_AMBULATORY_CARE_PROVIDER_SITE_OTHER): Payer: Medicare Other

## 2012-08-02 ENCOUNTER — Other Ambulatory Visit: Payer: Self-pay | Admitting: Cardiovascular Disease

## 2012-08-02 DIAGNOSIS — L570 Actinic keratosis: Secondary | ICD-10-CM | POA: Diagnosis not present

## 2012-08-02 DIAGNOSIS — Z85828 Personal history of other malignant neoplasm of skin: Secondary | ICD-10-CM | POA: Diagnosis not present

## 2012-08-02 DIAGNOSIS — I2510 Atherosclerotic heart disease of native coronary artery without angina pectoris: Secondary | ICD-10-CM

## 2012-08-02 DIAGNOSIS — D239 Other benign neoplasm of skin, unspecified: Secondary | ICD-10-CM | POA: Diagnosis not present

## 2012-08-02 DIAGNOSIS — D1801 Hemangioma of skin and subcutaneous tissue: Secondary | ICD-10-CM | POA: Diagnosis not present

## 2012-08-02 DIAGNOSIS — L819 Disorder of pigmentation, unspecified: Secondary | ICD-10-CM | POA: Diagnosis not present

## 2012-08-02 DIAGNOSIS — L821 Other seborrheic keratosis: Secondary | ICD-10-CM | POA: Diagnosis not present

## 2012-08-02 LAB — COMPREHENSIVE METABOLIC PANEL
ALT: 17 U/L (ref 0–53)
AST: 22 U/L (ref 0–37)
Albumin: 4.9 g/dL (ref 3.5–5.2)
Alkaline Phosphatase: 52 U/L (ref 39–117)
BUN: 15 mg/dL (ref 6–23)
CO2: 31 mEq/L (ref 19–32)
Calcium: 10.6 mg/dL — ABNORMAL HIGH (ref 8.4–10.5)
Chloride: 102 mEq/L (ref 96–112)
Creatinine, Ser: 0.81 mg/dL (ref 0.50–1.35)
Glucose, Bld: 76 mg/dL (ref 70–99)
Potassium: 3.7 mEq/L (ref 3.5–5.3)
Sodium: 141 mEq/L (ref 135–145)
Total Bilirubin: 0.8 mg/dL (ref 0.3–1.2)
Total Protein: 7.5 g/dL (ref 6.0–8.3)

## 2012-08-02 LAB — LIPID PANEL
Cholesterol: 148 mg/dL (ref 0–200)
HDL: 66.3 mg/dL (ref >39.00)
LDL Cholesterol: 70 mg/dL (ref 0–99)
Total CHOL/HDL Ratio: 2
Triglycerides: 57 mg/dL (ref 0.0–149.0)
VLDL: 11.4 mg/dL (ref 0.0–40.0)

## 2012-08-02 LAB — IGM: IgM, Serum: 327 mg/dL — ABNORMAL HIGH (ref 41–251)

## 2012-08-02 LAB — HEPATIC FUNCTION PANEL
ALT: 19 U/L (ref 0–53)
AST: 23 U/L (ref 0–37)
Albumin: 4.4 g/dL (ref 3.5–5.2)
Alkaline Phosphatase: 47 U/L (ref 39–117)
Bilirubin, Direct: 0.1 mg/dL (ref 0.0–0.3)
Total Bilirubin: 1 mg/dL (ref 0.3–1.2)
Total Protein: 7.1 g/dL (ref 6.0–8.3)

## 2012-08-07 DIAGNOSIS — H6690 Otitis media, unspecified, unspecified ear: Secondary | ICD-10-CM | POA: Diagnosis not present

## 2012-08-11 DIAGNOSIS — H6500 Acute serous otitis media, unspecified ear: Secondary | ICD-10-CM | POA: Diagnosis not present

## 2012-08-22 ENCOUNTER — Ambulatory Visit (INDEPENDENT_AMBULATORY_CARE_PROVIDER_SITE_OTHER): Payer: Medicare Other | Admitting: Cardiovascular Disease

## 2012-08-22 ENCOUNTER — Encounter: Payer: Self-pay | Admitting: Cardiovascular Disease

## 2012-08-22 VITALS — BP 122/70 | HR 60 | Ht 70.0 in | Wt 186.8 lb

## 2012-08-22 DIAGNOSIS — I2510 Atherosclerotic heart disease of native coronary artery without angina pectoris: Secondary | ICD-10-CM

## 2012-08-22 DIAGNOSIS — R0989 Other specified symptoms and signs involving the circulatory and respiratory systems: Secondary | ICD-10-CM | POA: Insufficient documentation

## 2012-08-22 NOTE — Assessment & Plan Note (Signed)
The patient has a soft carotid bruit. His brother were required carotid endarterectomy and he has a strong family history of both coronary and peripheral arterial disease. Recommend check a carotid duplex scan.

## 2012-08-22 NOTE — Patient Instructions (Signed)
Your physician has requested that you have a carotid duplex. This test is an ultrasound of the carotid arteries in your neck. It looks at blood flow through these arteries that supply the brain with blood. Allow one hour for this exam. There are no restrictions or special instructions.  Your physician has requested that you have an exercise tolerance test in February 2014 with Dr Excell Seltzer. For further information please visit [email or web address] Please also follow instruction sheet, as given.  Your physician recommends that you continue on your current medications as directed. Please refer to the Current Medication list given to you today.

## 2012-08-22 NOTE — Progress Notes (Addendum)
HPI:  71 year old gentleman present for followup of coronary atherosclerosis. He has a history of myocardial infarction and drug-eluting stent placement in the right coronary artery. 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 He's been managed medically. He has multiple noncardiac problems including CIDP, pulmonary sarcoid, and monoclonal gammopathy of undetermined significance. He had an exercise treadmill study last in February 2013 where he exercised for 9 minutes according to the Bruce protocol. There was no angina and there were no significant ST changes with exertion. Lipid panel from 08/02/2012 showed a cholesterol of 148, triglycerides 57, HDL 66, and LDL 70.  The patient is doing well. He has continued with the consistent weight loss. He is maintained an exercise program where he walks for 30 minutes every day. He denies chest pain or pressure, dyspnea, orthopnea, PND, palpitations, lightheadedness, or syncope. He's been compliant with his medical program.  Outpatient Encounter Prescriptions as of 08/22/2012  Medication Sig Dispense Refill  . albuterol (PROVENTIL HFA) 108 (90 BASE) MCG/ACT inhaler Inhale 2 puffs into the lungs every 6 (six) hours as needed.        [name] amLODipine (NORVASC) 10 MG tablet Take 1 tablet (10 mg total) by mouth daily.  90 tablet  3  . aspirin 81 MG tablet Take 81 mg by mouth daily.        [name] atorvastatin (LIPITOR) 20 MG tablet TAKE ONE TABLET BY MOUTH EVERY DAY  90 tablet  0  . budesonide (PULMICORT) 180 MCG/ACT inhaler Inhale 2 puffs into the lungs 2 (two) times daily.  3 each  3  . celecoxib (CELEBREX) 200 MG capsule Take 200 mg by mouth daily.      . Cholecalciferol (VITAMIN D-3) 5000 UNITS TABS Take 1 tablet by mouth daily.        . clopidogrel (PLAVIX) 75 MG tablet TAKE ONE TABLET BY MOUTH EVERY DAY  90 tablet  3  . finasteride (PROSCAR) 5 MG tablet Take 5 mg by mouth daily.        . fluticasone  (FLONASE) 50 MCG/ACT nasal spray Place 2 sprays into the nose 2 (two) times daily.  48 g  3  . gabapentin (NEURONTIN) 800 MG tablet Take 1 tablet (800 mg total) by mouth 3 (three) times daily.  270 tablet  3  . Multiple Vitamin (MULTIVITAMIN) tablet Take 1 tablet by mouth daily.        . pantoprazole (PROTONIX) 40 MG tablet Take 40 mg by mouth daily.        . RiTUXimab (RITUXAN IV) Inject into the vein. every 2 month       . tadalafil (CIALIS) 5 MG tablet Take 5 mg by mouth daily.      [name] triamterene-hydrochlorothiazide (MAXZIDE-25) 37.5-25 MG per tablet Take 1 each (1 tablet total) by mouth daily.  90 tablet  3  . nitroGLYCERIN (NITROSTAT) 0.4 MG SL tablet Place 1 tablet (0.4 mg total) under the tongue every 5 (five) minutes as needed.  90 tablet  3    No Known Allergies  Past Medical History  Diagnosis Date  . BENIGN PROSTATIC HYPERTROPHY, WITH OBSTRUCTION 05/28/2010  . COLONIC POLYPS, ADENOMATOUS, HX OF 05/28/2010  . GENERALIZED OSTEOARTHROSIS UNSPECIFIED SITE 05/28/2010  . GERD 05/28/2010  . HYPERLIPIDEMIA 05/28/2010  . Intrinsic asthma, 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  . Malignant neoplasm of descending colon 05/28/2010  . MITRAL VALVE PROLAPSE 05/28/2010  . 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 PALPITATIONS, CHRONIC 05/28/2010  . SLEEP APNEA,  OBSTRUCTIVE, MODERATE 05/28/2010  . UNSPECIFIED INFLAMMATORY AND TOXIC NEUROPATHY 05/28/2010  . VITAMIN D DEFICIENCY 05/28/2010  . CAD (coronary artery disease)     a. s/p NSTEMI 06/01/11: DES to RCA;  b. cath 06/25/11:   dLM 50-60% (FFR 0.87), prox to mid LAD 40-50%, D1 50%, pCFX 50%, RCA stent ok, dPDA 80%, EF 55-60%.  His FFR was felt to be negative and therefore medical therapy was recommended ;  echo 6/12: EF 55-60%, mild AS   . 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 Coronary artery disease 12/11/2011  . MI (myocardial infarction) 12/11/2011  . CIDP (chronic inflammatory demyelinating polyneuropathy) 04/11/2012    ROS: Negative except as per  HPI  BP 122/70  Pulse 60  Ht 5\' 10"  (1.778 m)  Wt 186 lb 12.8 oz (84.732 kg)  BMI 26.80 kg/m2  PHYSICAL EXAM: Pt is alert and oriented, NAD HEENT: normal Neck: JVP - normal, carotids 2+= with a soft left carotid bruit Lungs: CTA bilaterally CV: RRR without murmur or gallop Abd: soft, NT, Positive BS, no hepatomegaly Ext: no C/C/E, distal pulses intact and equal Skin: warm/dry no rash  EKG:  Sinus bradycardia with first degree AV block, heart rate 57 beats per minute, otherwise within normal limits.  ASSESSMENT AND PLAN:  Addendum 02/14/2013: I have received a request for Mr. 68 hold Plavix for a lumbar epidural steroid injection. He is at low risk of cardiac complications from holding Plavix for 5 days before steroid injection. He should resume Plavix after the procedure as determined by the proceduralist.  Tonny Bollman 02/14/2013 3:25 PM

## 2012-08-22 NOTE — Assessment & Plan Note (Signed)
Lipid panel was reviewed and it is excellent. He has previously had a panel consistent with metabolic syndrome with high triglycerides and low HDL cholesterol. He's made remarkable changes in his lifestyle and with a combination of his medical therapy which includes atorvastatin 20 mg, his lipid panel is outstanding. His HDL has increased from 37-66 and his LDL is in the range of 70. He will continue with his current program.

## 2012-08-22 NOTE — Assessment & Plan Note (Signed)
The patient is doing remarkably well with medical therapy. He will continue on a combination of aspirin and Plavix. Considering his multivessel and moderate left main disease, I think we should continue surveillance with yearly exercise treadmill studies. He will be due for this in February 2014.

## 2012-08-23 ENCOUNTER — Ambulatory Visit (HOSPITAL_BASED_OUTPATIENT_CLINIC_OR_DEPARTMENT_OTHER): Payer: Medicare Other | Admitting: Nurse Practitioner

## 2012-08-23 ENCOUNTER — Telehealth: Payer: Self-pay | Admitting: Oncology

## 2012-08-23 ENCOUNTER — Other Ambulatory Visit (HOSPITAL_BASED_OUTPATIENT_CLINIC_OR_DEPARTMENT_OTHER): Payer: Medicare Other

## 2012-08-23 ENCOUNTER — Other Ambulatory Visit: Payer: Self-pay | Admitting: Cardiovascular Disease

## 2012-08-23 VITALS — BP 121/68 | HR 71 | Temp 98.2°F | Resp 18 | Ht 70.0 in | Wt 187.2 lb

## 2012-08-23 DIAGNOSIS — G619 Inflammatory polyneuropathy, unspecified: Secondary | ICD-10-CM

## 2012-08-23 DIAGNOSIS — D472 Monoclonal gammopathy: Secondary | ICD-10-CM

## 2012-08-23 DIAGNOSIS — G6181 Chronic inflammatory demyelinating polyneuritis: Secondary | ICD-10-CM

## 2012-08-23 DIAGNOSIS — G622 Polyneuropathy due to other toxic agents: Secondary | ICD-10-CM | POA: Diagnosis not present

## 2012-08-23 LAB — COMPREHENSIVE METABOLIC PANEL (CC13)
ALT: 28 U/L (ref 0–55)
AST: 21 U/L (ref 5–34)
Albumin: 4.1 g/dL (ref 3.5–5.0)
Alkaline Phosphatase: 54 U/L (ref 40–150)
BUN: 17 mg/dL (ref 7.0–26.0)
CO2: 30 mEq/L — ABNORMAL HIGH (ref 22–29)
Calcium: 10.3 mg/dL (ref 8.4–10.4)
Chloride: 103 mEq/L (ref 98–107)
Creatinine: 0.9 mg/dL (ref 0.7–1.3)
Glucose: 94 mg/dl (ref 70–99)
Potassium: 3.8 mEq/L (ref 3.5–5.1)
Sodium: 142 mEq/L (ref 136–145)
Total Bilirubin: 0.9 mg/dL (ref 0.20–1.20)
Total Protein: 6.7 g/dL (ref 6.4–8.3)

## 2012-08-23 LAB — CBC & DIFF AND RETIC
BASO%: 0.6 % (ref 0.0–2.0)
Basophils Absolute: 0 10*3/uL (ref 0.0–0.1)
EOS%: 3 % (ref 0.0–7.0)
Eosinophils Absolute: 0.2 10*3/uL (ref 0.0–0.5)
HCT: 42.2 % (ref 38.4–49.9)
HGB: 14.5 g/dL (ref 13.0–17.1)
Immature Retic Fract: 2.2 % — ABNORMAL LOW (ref 3.00–10.60)
LYMPH%: 15.9 % (ref 14.0–49.0)
MCH: 31 pg (ref 27.2–33.4)
MCHC: 34.4 g/dL (ref 32.0–36.0)
MCV: 90.4 fL (ref 79.3–98.0)
MONO#: 0.7 10*3/uL (ref 0.1–0.9)
MONO%: 14 % (ref 0.0–14.0)
NEUT#: 3.5 10*3/uL (ref 1.5–6.5)
NEUT%: 66.5 % (ref 39.0–75.0)
Platelets: 183 10*3/uL (ref 140–400)
RBC: 4.67 10*6/uL (ref 4.20–5.82)
RDW: 14 % (ref 11.0–14.6)
Retic %: 1.84 % — ABNORMAL HIGH (ref 0.80–1.80)
Retic Ct Abs: 85.93 10*3/uL (ref 34.80–93.90)
WBC: 5.3 10*3/uL (ref 4.0–10.3)
lymph#: 0.8 10*3/uL — ABNORMAL LOW (ref 0.9–3.3)
nRBC: 0 % (ref 0–0)

## 2012-08-23 LAB — IGM: IgM, Serum: 320 mg/dL — ABNORMAL HIGH (ref 41–251)

## 2012-08-23 NOTE — Telephone Encounter (Signed)
Added f/u for 11/26. gv pt appt schedule for October/November.

## 2012-08-23 NOTE — Progress Notes (Signed)
OFFICE PROGRESS NOTE  Interval history:  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 He has a painful distal neuropathy involving the feet worse than the hands. He was given an initial trial of Rituxan in 2010 with no significant improvement. Rituxan was reinitiated April 2012 with a significant improvement noted. He is now receiving Rituxan on a 2 month schedule. He is seen today for scheduled followup.  [name] continues to note improvement in the neuropathy symptoms. He is able to complete daily walks both outside and on a treadmill. He denies any problems with the Rituxan infusion. Specifically no fever, chills, rash, shortness of breath.   Objective: Blood pressure 121/68, pulse 71, temperature 98.2 F (36.8 C), temperature source Oral, resp. rate 18, height 5\' 10"  (1.778 m), weight 187 lb 3.2 oz (84.913 kg).  Oropharynx is without thrush or ulceration. No palpable cervical, supraclavicular or axillary lymph nodes. Lungs clear. No wheezes or rales. Regular cardiac rhythm. Abdomen soft and nontender. No organomegaly. Extremities are without edema. Calves soft and nontender. Motor strength 5 over 5. Knee DTRs absent symmetrically. Vibratory sense moderately decreased over the fingertips per tuning fork exam right hand slightly greater than the left hand.  Lab Results: Lab Results  Component Value Date   WBC 5.3 08/23/2012   HGB 14.5 08/23/2012   HCT 42.2 08/23/2012   MCV 90.4 08/23/2012   PLT 183 08/23/2012    Chemistry:    Chemistry      Component Value Date/Time   NA 141 08/01/2012 1118   K 3.7 08/01/2012 1118   CL 102 08/01/2012 1118   CO2 31 08/01/2012 1118   BUN 15 08/01/2012 1118   CREATININE 0.81 08/01/2012 1118   CREATININE 0.87 07/30/2011 0733   CREATININE 0.87 07/30/2011 0733   CREATININE 0.87 07/30/2011 0733      Component Value Date/Time   CALCIUM 10.6* 08/01/2012 1118   ALKPHOS 47 08/02/2012  1042   AST 23 08/02/2012 1042   ALT 19 08/02/2012 1042   BILITOT 1.0 08/02/2012 1042       Studies/Results: No results found.  Medications: I have reviewed the patient's current medications.  Assessment/Plan:  1. Chronic inflammatory demyelinating polyneuropathy. Symptoms improved on Rituxan. 2. Pulmonary sarcoidosis. 3. 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 Most recent value stable. 4. Coronary artery disease status post MI, status post right coronary drug-eluting stent. 5. Hypertension. 6. BPH. 7. Adenomatous polyps of the colon.  Disposition-[name] continues to note improvement in the neuropathy symptoms. The plan is to continue Rituxan for a total of 2 years on the 2 month schedule. He is scheduled to return for the next Rituxan infusion on 09/26/2012. He will return for a followup visit and Rituxan in 4 months. He will contact the office in the interim with any problems.  Lonna Cobb ANP/GNP-BC   CC: Dr. Illene Regulus, Dr. Porfirio Mylar Dohmeier, Dr. Tonny Bollman, Dr. [name] [phone].

## 2012-08-24 ENCOUNTER — Encounter: Payer: Self-pay | Admitting: Internal Medicine

## 2012-08-24 ENCOUNTER — Telehealth: Payer: Self-pay | Admitting: Physician Assistant

## 2012-08-24 ENCOUNTER — Ambulatory Visit (INDEPENDENT_AMBULATORY_CARE_PROVIDER_SITE_OTHER): Payer: Medicare Other | Admitting: Internal Medicine

## 2012-08-24 VITALS — BP 120/52 | HR 60 | Temp 98.4°F | Resp 16 | Wt 186.0 lb

## 2012-08-24 DIAGNOSIS — H9190 Unspecified hearing loss, unspecified ear: Secondary | ICD-10-CM | POA: Diagnosis not present

## 2012-08-24 DIAGNOSIS — M25569 Pain in unspecified knee: Secondary | ICD-10-CM

## 2012-08-24 DIAGNOSIS — H6980 Other specified disorders of Eustachian tube, unspecified ear: Secondary | ICD-10-CM | POA: Diagnosis not present

## 2012-08-24 DIAGNOSIS — M159 Polyosteoarthritis, unspecified: Secondary | ICD-10-CM

## 2012-08-24 MED ORDER — PANTOPRAZOLE SODIUM 40 MG PO TBEC: 40.0000 mg | DELAYED_RELEASE_TABLET | Freq: Every day | ORAL | Status: DC

## 2012-08-24 NOTE — Progress Notes (Signed)
Subjective:    Patient ID: Derrick [name], male    DOB: Oct 28, 1941, 71 y.o.   MRN: [id number]  HPI Reports on August 12th having pain in the left ear. He went to urgent care and was diagnosed with OM and eustachian tube dysfunction. He had no fever or drainage. He could not hear out of the left ear. He was treated with ceftin for 5 days with no improvement. He had continued loss of hearing and tinnitus. He returned and was treated with doxycycline bid x 10 days and prednisone 5 day dosepak. He has had some recovery of hearing but still down and continued tinnitus. He does not feel much ear pressure. NO headache, no fever, no cough. No other systemic symptoms.  He had arthroscopic surgery right knee for torn meniscus '10. He has done fairly well but now he is having recurrent pain in the knee with walking and at night. He admits to altering his gait. The pain is not in the join but in the tendons.  Past Medical History  Diagnosis Date  . BENIGN PROSTATIC HYPERTROPHY, WITH OBSTRUCTION 05/28/2010  . COLONIC POLYPS, ADENOMATOUS, HX OF 05/28/2010  . GENERALIZED OSTEOARTHROSIS UNSPECIFIED SITE 05/28/2010  . GERD 05/28/2010  . HYPERLIPIDEMIA 05/28/2010  . Intrinsic asthma, 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  . Malignant neoplasm of descending colon 05/28/2010  . MITRAL VALVE PROLAPSE 05/28/2010  . 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 PALPITATIONS, CHRONIC 05/28/2010  . SLEEP APNEA, OBSTRUCTIVE, MODERATE 05/28/2010  . UNSPECIFIED INFLAMMATORY AND TOXIC NEUROPATHY 05/28/2010  . VITAMIN D DEFICIENCY 05/28/2010  . CAD (coronary artery disease)     a. s/p NSTEMI 06/01/11: DES to RCA;  b. cath 06/25/11:   dLM 50-60% (FFR 0.87), prox to mid LAD 40-50%, D1 50%, pCFX 50%, RCA stent ok, dPDA 80%, EF 55-60%.  His FFR was felt to be negative and therefore medical therapy was recommended ;  echo 6/12: EF 55-60%, mild AS   . 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 Coronary artery disease 12/11/2011  . MI (myocardial infarction)  12/11/2011  . CIDP (chronic inflammatory demyelinating polyneuropathy) 04/11/2012   Past Surgical History  Procedure Date  . Tonsillectomy   . Colon surgery     decending colon 200  . Lung wedge 2001  . Appendectomy 1954   Family History  Problem Relation Age of Onset  . Arthritis Mother     severe  . Coronary artery disease Mother   . Coronary artery disease Brother    History   Social History  . Marital Status: Married    Spouse Name: N/A    Number of Children: N/A  . Years of Education: N/A   Occupational History  . Not on file.   Social History Main Topics  . Smoking status: Never Smoker   . Smokeless tobacco: Never Used  . Alcohol Use: No  . Drug Use: No  . Sexually Active: Not on file   Other Topics Concern  . Not on file   Social History Narrative   College- [employer]; [employer]-environment science and mgt. Married "70" 1 son- 46; 1 dtr "77" 2 grandchildren. 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 End of life-provided discussive context and provided packet.    Current Outpatient Prescriptions on File Prior to Visit  Medication Sig Dispense Refill  . albuterol (PROVENTIL HFA) 108 (90 BASE) MCG/ACT inhaler Inhale 2 puffs into the lungs every 6 (six) hours as needed.        [name] amLODipine (NORVASC) 10 MG tablet Take 1  tablet (10 mg total) by mouth daily.  90 tablet  3  . aspirin 81 MG tablet Take 81 mg by mouth daily.        [name] atorvastatin (LIPITOR) 20 MG tablet TAKE ONE TABLET BY MOUTH EVERY DAY  90 tablet  0  . budesonide (PULMICORT) 180 MCG/ACT inhaler Inhale 2 puffs into the lungs 2 (two) times daily.  3 each  3  . celecoxib (CELEBREX) 200 MG capsule Take 200 mg by mouth daily.      . Cholecalciferol (VITAMIN D-3) 5000 UNITS TABS Take 1 tablet by mouth daily.        . clopidogrel (PLAVIX) 75 MG tablet TAKE ONE TABLET BY MOUTH EVERY DAY  90 tablet  3  . finasteride (PROSCAR) 5 MG tablet Take 5 mg by mouth daily.        . fluticasone (FLONASE) 50  MCG/ACT nasal spray Place 2 sprays into the nose 2 (two) times daily.  48 g  3  . gabapentin (NEURONTIN) 800 MG tablet Take 1 tablet (800 mg total) by mouth 3 (three) times daily.  270 tablet  3  . Multiple Vitamin (MULTIVITAMIN) tablet Take 1 tablet by mouth daily.        . pantoprazole (PROTONIX) 40 MG tablet Take 40 mg by mouth daily.        . RiTUXimab (RITUXAN IV) Inject into the vein. every 2 month       . tadalafil (CIALIS) 5 MG tablet Take 5 mg by mouth daily.      [name] triamterene-hydrochlorothiazide (MAXZIDE-25) 37.5-25 MG per tablet Take 1 each (1 tablet total) by mouth daily.  90 tablet  3  . nitroGLYCERIN (NITROSTAT) 0.4 MG SL tablet Place 1 tablet (0.4 mg total) under the tongue every 5 (five) minutes as needed.  90 tablet  3      Review of Systems System review is negative for any constitutional, cardiac, pulmonary, GI or neuro symptoms or complaints other than as described in the HPI.     Objective:   Physical Exam Filed Vitals:   08/24/12 1534  BP: 120/52  Pulse: 60  Temp: 98.4 F (36.9 C)  Resp: 16   gen'l- WNWD white man in no acute distress HEENT- Right EAC, TM normal, Left EAC with some cerumen, TM with erythema anterior superior quadrant, poor visualization of ossicles, poor movement w/ insuffulation. Cor- RRR Pulm - CTAP MSK- right knee w/o crepitus, tender at tendonous insertions tibia and fibula.       Assessment & Plan:  Hearing loss - at this point you have what appears to be persistent eustachian tube dysfunction. Plan  Sudafed 30 mg three times a day   For lack of improvement, next step in ENT consult

## 2012-08-24 NOTE — Patient Instructions (Addendum)
Hearing loss - at this point you have what appears to be persistent eustachian tube dysfunction. Plan  Sudafed 30 mg three times a day   For lack of improvement, next step in ENT consult  Knee pain - really tendon inflammation related to altered gait from Degenerative joint disease left knee. Plan  Refer to Dr. Sharia Reeve [place], At [facility] orthopedics for assessment, possible injection therapy for the knee and appropriate PT.

## 2012-08-24 NOTE — Telephone Encounter (Signed)
Pt called because Dr Excell Seltzer said it was OK to continue the Protonix and pt had not received refill authorization yet.   No dose change and med is not being refilled early. Will refill.

## 2012-09-15 DIAGNOSIS — Z23 Encounter for immunization: Secondary | ICD-10-CM | POA: Diagnosis not present

## 2012-09-20 DIAGNOSIS — R972 Elevated prostate specific antigen [PSA]: Secondary | ICD-10-CM | POA: Diagnosis not present

## 2012-09-20 DIAGNOSIS — Z0000 Encounter for general adult medical examination without abnormal findings: Secondary | ICD-10-CM | POA: Diagnosis not present

## 2012-09-25 ENCOUNTER — Encounter (INDEPENDENT_AMBULATORY_CARE_PROVIDER_SITE_OTHER): Payer: Medicare Other

## 2012-09-25 DIAGNOSIS — I6529 Occlusion and stenosis of unspecified carotid artery: Secondary | ICD-10-CM | POA: Diagnosis not present

## 2012-09-25 DIAGNOSIS — R0989 Other specified symptoms and signs involving the circulatory and respiratory systems: Secondary | ICD-10-CM

## 2012-09-25 DIAGNOSIS — I2510 Atherosclerotic heart disease of native coronary artery without angina pectoris: Secondary | ICD-10-CM

## 2012-09-25 NOTE — Assessment & Plan Note (Signed)
Severe knee pain.  Plan - Refer to Dr. Dion Saucier at [facility]

## 2012-09-26 ENCOUNTER — Ambulatory Visit (HOSPITAL_BASED_OUTPATIENT_CLINIC_OR_DEPARTMENT_OTHER): Payer: Medicare Other

## 2012-09-26 ENCOUNTER — Other Ambulatory Visit (HOSPITAL_BASED_OUTPATIENT_CLINIC_OR_DEPARTMENT_OTHER): Payer: Medicare Other

## 2012-09-26 VITALS — BP 128/66 | HR 57 | Temp 98.4°F | Resp 20

## 2012-09-26 DIAGNOSIS — D869 Sarcoidosis, unspecified: Secondary | ICD-10-CM | POA: Diagnosis not present

## 2012-09-26 DIAGNOSIS — Z5112 Encounter for antineoplastic immunotherapy: Secondary | ICD-10-CM | POA: Diagnosis not present

## 2012-09-26 DIAGNOSIS — G622 Polyneuropathy due to other toxic agents: Secondary | ICD-10-CM

## 2012-09-26 DIAGNOSIS — D472 Monoclonal gammopathy: Secondary | ICD-10-CM

## 2012-09-26 DIAGNOSIS — G6181 Chronic inflammatory demyelinating polyneuritis: Secondary | ICD-10-CM

## 2012-09-26 DIAGNOSIS — G619 Inflammatory polyneuropathy, unspecified: Secondary | ICD-10-CM

## 2012-09-26 LAB — COMPREHENSIVE METABOLIC PANEL (CC13)
ALT: 21 U/L (ref 0–55)
AST: 27 U/L (ref 5–34)
Albumin: 4.5 g/dL (ref 3.5–5.0)
Alkaline Phosphatase: 56 U/L (ref 40–150)
BUN: 15 mg/dL (ref 7.0–26.0)
CO2: 27 mEq/L (ref 22–29)
Calcium: 10.8 mg/dL — ABNORMAL HIGH (ref 8.4–10.4)
Chloride: 100 mEq/L (ref 98–107)
Creatinine: 0.9 mg/dL (ref 0.7–1.3)
Glucose: 92 mg/dl (ref 70–99)
Potassium: 4.2 mEq/L (ref 3.5–5.1)
Sodium: 139 mEq/L (ref 136–145)
Total Bilirubin: 0.9 mg/dL (ref 0.20–1.20)
Total Protein: 7.5 g/dL (ref 6.4–8.3)

## 2012-09-26 LAB — CBC WITH DIFFERENTIAL/PLATELET
BASO%: 0.4 % (ref 0.0–2.0)
Basophils Absolute: 0 10*3/uL (ref 0.0–0.1)
EOS%: 3.2 % (ref 0.0–7.0)
Eosinophils Absolute: 0.2 10*3/uL (ref 0.0–0.5)
HCT: 42.3 % (ref 38.4–49.9)
HGB: 14.9 g/dL (ref 13.0–17.1)
LYMPH%: 17.2 % (ref 14.0–49.0)
MCH: 31.4 pg (ref 27.2–33.4)
MCHC: 35.2 g/dL (ref 32.0–36.0)
MCV: 89.1 fL (ref 79.3–98.0)
MONO#: 0.6 10*3/uL (ref 0.1–0.9)
MONO%: 12.9 % (ref 0.0–14.0)
NEUT#: 3.1 10*3/uL (ref 1.5–6.5)
NEUT%: 66.3 % (ref 39.0–75.0)
Platelets: 173 10*3/uL (ref 140–400)
RBC: 4.75 10*6/uL (ref 4.20–5.82)
RDW: 13.2 % (ref 11.0–14.6)
WBC: 4.6 10*3/uL (ref 4.0–10.3)
lymph#: 0.8 10*3/uL — ABNORMAL LOW (ref 0.9–3.3)
nRBC: 0 % (ref 0–0)

## 2012-09-26 MED ORDER — ACETAMINOPHEN 325 MG PO TABS
650.0000 mg | ORAL_TABLET | Freq: Once | ORAL | Status: AC
  Administered 2012-09-26: 650 mg via ORAL

## 2012-09-26 MED ORDER — SODIUM CHLORIDE 0.9 % IV SOLN
375.0000 mg/m2 | Freq: Once | INTRAVENOUS | Status: AC
  Administered 2012-09-26: 800 mg via INTRAVENOUS
  Filled 2012-09-26: qty 80

## 2012-09-26 MED ORDER — SODIUM CHLORIDE 0.9 % IV SOLN
Freq: Once | INTRAVENOUS | Status: AC
  Administered 2012-09-26: 12:00:00 via INTRAVENOUS

## 2012-09-26 MED ORDER — DIPHENHYDRAMINE HCL 25 MG PO CAPS
50.0000 mg | ORAL_CAPSULE | Freq: Once | ORAL | Status: AC
  Administered 2012-09-26: 50 mg via ORAL

## 2012-09-27 DIAGNOSIS — S8410XA Injury of peroneal nerve at lower leg level, unspecified leg, initial encounter: Secondary | ICD-10-CM | POA: Diagnosis not present

## 2012-09-27 DIAGNOSIS — M5137 Other intervertebral disc degeneration, lumbosacral region: Secondary | ICD-10-CM | POA: Diagnosis not present

## 2012-09-27 DIAGNOSIS — G6181 Chronic inflammatory demyelinating polyneuritis: Secondary | ICD-10-CM | POA: Diagnosis not present

## 2012-09-27 DIAGNOSIS — G4733 Obstructive sleep apnea (adult) (pediatric): Secondary | ICD-10-CM | POA: Diagnosis not present

## 2012-09-27 LAB — IGM: IgM, Serum: 338 mg/dL — ABNORMAL HIGH (ref 41–251)

## 2012-09-28 DIAGNOSIS — H40059 Ocular hypertension, unspecified eye: Secondary | ICD-10-CM | POA: Diagnosis not present

## 2012-09-28 DIAGNOSIS — M25569 Pain in unspecified knee: Secondary | ICD-10-CM | POA: Diagnosis not present

## 2012-10-13 DIAGNOSIS — N529 Male erectile dysfunction, unspecified: Secondary | ICD-10-CM | POA: Diagnosis not present

## 2012-10-13 DIAGNOSIS — R972 Elevated prostate specific antigen [PSA]: Secondary | ICD-10-CM | POA: Diagnosis not present

## 2012-10-13 DIAGNOSIS — N401 Enlarged prostate with lower urinary tract symptoms: Secondary | ICD-10-CM | POA: Diagnosis not present

## 2012-10-17 ENCOUNTER — Other Ambulatory Visit: Payer: Self-pay | Admitting: Cardiovascular Disease

## 2012-11-16 ENCOUNTER — Other Ambulatory Visit: Payer: Self-pay | Admitting: Oncology

## 2012-11-21 ENCOUNTER — Telehealth: Payer: Self-pay | Admitting: Oncology

## 2012-11-21 ENCOUNTER — Other Ambulatory Visit (HOSPITAL_BASED_OUTPATIENT_CLINIC_OR_DEPARTMENT_OTHER): Payer: Medicare Other | Admitting: Lab

## 2012-11-21 ENCOUNTER — Ambulatory Visit (HOSPITAL_BASED_OUTPATIENT_CLINIC_OR_DEPARTMENT_OTHER): Payer: Medicare Other | Admitting: Oncology

## 2012-11-21 ENCOUNTER — Ambulatory Visit (HOSPITAL_BASED_OUTPATIENT_CLINIC_OR_DEPARTMENT_OTHER): Payer: Medicare Other

## 2012-11-21 ENCOUNTER — Telehealth: Payer: Self-pay | Admitting: *Deleted

## 2012-11-21 VITALS — BP 118/67 | HR 55 | Temp 98.8°F

## 2012-11-21 VITALS — BP 134/63 | HR 62 | Temp 98.5°F | Resp 20 | Ht 70.0 in | Wt 186.1 lb

## 2012-11-21 DIAGNOSIS — D472 Monoclonal gammopathy: Secondary | ICD-10-CM

## 2012-11-21 DIAGNOSIS — G622 Polyneuropathy due to other toxic agents: Secondary | ICD-10-CM

## 2012-11-21 DIAGNOSIS — G6181 Chronic inflammatory demyelinating polyneuritis: Secondary | ICD-10-CM | POA: Diagnosis not present

## 2012-11-21 DIAGNOSIS — D869 Sarcoidosis, unspecified: Secondary | ICD-10-CM | POA: Diagnosis not present

## 2012-11-21 DIAGNOSIS — Z5112 Encounter for antineoplastic immunotherapy: Secondary | ICD-10-CM | POA: Diagnosis not present

## 2012-11-21 DIAGNOSIS — G619 Inflammatory polyneuropathy, unspecified: Secondary | ICD-10-CM

## 2012-11-21 LAB — CBC WITH DIFFERENTIAL/PLATELET
BASO%: 0.5 % (ref 0.0–2.0)
Basophils Absolute: 0 10*3/uL (ref 0.0–0.1)
EOS%: 3.2 % (ref 0.0–7.0)
Eosinophils Absolute: 0.2 10*3/uL (ref 0.0–0.5)
HCT: 39 % (ref 38.4–49.9)
HGB: 13.5 g/dL (ref 13.0–17.1)
LYMPH%: 15.5 % (ref 14.0–49.0)
MCH: 31.5 pg (ref 27.2–33.4)
MCHC: 34.6 g/dL (ref 32.0–36.0)
MCV: 91.1 fL (ref 79.3–98.0)
MONO#: 0.6 10*3/uL (ref 0.1–0.9)
MONO%: 11.1 % (ref 0.0–14.0)
NEUT#: 3.9 10*3/uL (ref 1.5–6.5)
NEUT%: 69.7 % (ref 39.0–75.0)
Platelets: 183 10*3/uL (ref 140–400)
RBC: 4.28 10*6/uL (ref 4.20–5.82)
RDW: 12.8 % (ref 11.0–14.6)
WBC: 5.6 10*3/uL (ref 4.0–10.3)
lymph#: 0.9 10*3/uL (ref 0.9–3.3)
nRBC: 0 % (ref 0–0)

## 2012-11-21 LAB — COMPREHENSIVE METABOLIC PANEL (CC13)
ALT: 18 U/L (ref 0–55)
AST: 24 U/L (ref 5–34)
Albumin: 4.3 g/dL (ref 3.5–5.0)
Alkaline Phosphatase: 56 U/L (ref 40–150)
BUN: 17 mg/dL (ref 7.0–26.0)
CO2: 28 mEq/L (ref 22–29)
Calcium: 10.2 mg/dL (ref 8.4–10.4)
Chloride: 103 mEq/L (ref 98–107)
Creatinine: 0.8 mg/dL (ref 0.7–1.3)
Glucose: 92 mg/dl (ref 70–99)
Potassium: 3.8 mEq/L (ref 3.5–5.1)
Sodium: 142 mEq/L (ref 136–145)
Total Bilirubin: 0.92 mg/dL (ref 0.20–1.20)
Total Protein: 7.3 g/dL (ref 6.4–8.3)

## 2012-11-21 MED ORDER — ACETAMINOPHEN 325 MG PO TABS
650.0000 mg | ORAL_TABLET | Freq: Once | ORAL | Status: AC
  Administered 2012-11-21: 650 mg via ORAL

## 2012-11-21 MED ORDER — RITUXIMAB CHEMO INJECTION 10 MG/ML
375.0000 mg/m2 | Freq: Once | INTRAVENOUS | Status: AC
  Administered 2012-11-21: 800 mg via INTRAVENOUS
  Filled 2012-11-21: qty 80

## 2012-11-21 MED ORDER — SODIUM CHLORIDE 0.9 % IV SOLN
Freq: Once | INTRAVENOUS | Status: AC
  Administered 2012-11-21: 13:00:00 via INTRAVENOUS

## 2012-11-21 MED ORDER — DIPHENHYDRAMINE HCL 25 MG PO CAPS
50.0000 mg | ORAL_CAPSULE | Freq: Once | ORAL | Status: AC
  Administered 2012-11-21: 50 mg via ORAL

## 2012-11-21 NOTE — Patient Instructions (Addendum)
[facility] Discharge Instructions for Patients Receiving Chemotherapy  Today you received the following chemotherapy agents: rituxan  To help prevent nausea and vomiting after your treatment, we encourage you to take your nausea medication.  Take it as often as prescribed.     If you develop nausea and vomiting that is not controlled by your nausea medication, call the clinic. If it is after clinic hours your family physician or the after hours number for the clinic or go to the Emergency Department.   BELOW ARE SYMPTOMS THAT SHOULD BE REPORTED IMMEDIATELY:  *FEVER GREATER THAN 100.5 F  *CHILLS WITH OR WITHOUT FEVER  NAUSEA AND VOMITING THAT IS NOT CONTROLLED WITH YOUR NAUSEA MEDICATION  *UNUSUAL SHORTNESS OF BREATH  *UNUSUAL BRUISING OR BLEEDING  TENDERNESS IN MOUTH AND THROAT WITH OR WITHOUT PRESENCE OF ULCERS  *URINARY PROBLEMS  *BOWEL PROBLEMS  UNUSUAL RASH Items with * indicate a potential emergency and should be followed up as soon as possible.  Feel free to call the clinic you have any questions or concerns. The clinic phone number is [phone].   I have been informed and understand all the instructions given to me. I know to contact the clinic, my physician, or go to the Emergency Department if any problems should occur. I do not have any questions at this time, but understand that I may call the clinic during office hours   should I have any questions or need assistance in obtaining follow up care.    __________________________________________  _____________  __________ Signature of Patient or Authorized Representative            Date                   Time    __________________________________________ Nurse's Signature    

## 2012-11-21 NOTE — Progress Notes (Signed)
Hematology and Oncology Follow Up Visit  Eston Heslin [id number] 1940-12-28 71 y.o. 11/21/2012 4:52 PM   Principle Diagnosis: Encounter Diagnoses  Name Primary?  . UNSPECIFIED INFLAMMATORY AND TOXIC NEUROPATHY   . MGUS (monoclonal gammopathy of unknown significance) Yes  . PULMONARY SARCOIDOSIS      Interim History:   Follow up Visit for this 71 year old man felt to have CIDP (chronic inflammatory demyelinating polyneuropathy). He has an associated IgM monoclonal gammopathy. He was initially evaluated and treated by a hematologist and a neurologist in [place] before he moved to [place]. 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 Please see my previous progress notes for full details.  His neurologic condition is characterized primarily by painful distal neuropathy feet worse than hands and lower extremity weakness. At one point he could barely walk a block without having to stop due to pain and weakness. He was given an initial trial of Rituxan in 2010 with no significant improvement. However following reinitiation of Rituxan in April 2012 he did get a progressive improvement. He was put on a maintenance program which we have continued in this office on an every other month basis.  His endurance has definitely improved. He is now walking 30 minutes a day without having to stop. Although he still has painful dysesthesias of his feet, symptoms have stabilized and not progressed. He also feels that his respiratory symptoms have significantly improved over time and he is thinking of stopping his Pulmicort inhaler. Only new complaint today is pain in his right calf. He thinks this is a orthopedic problem related to bony spurs in his lumbar spine. He has had previous scans.   Medications: reviewed  Allergies: No Known Allergies  Review of Systems: Constitutional:   No constitutional symptoms Respiratory:  Currently no cough or dyspnea Cardiovascular:  No chest pain or palpitations Gastrointestinal: No change in bowel habit Genito-Urinary: No urinary tract symptoms Musculoskeletal: No muscle or bone pain other than noted above Neurologic: Moderate dysesthesias in his feet which are not limiting his activity Skin: No rash Remaining ROS negative.  Physical Exam: Blood pressure 134/63, pulse 62, temperature 98.5 F (36.9 C), temperature source Oral, resp. rate 20, height 5\' 10"  (1.778 m), weight 186 lb 1.6 oz (84.414 kg). Wt Readings from Last 3 Encounters:  11/21/12 186 lb 1.6 oz (84.414 kg)  08/24/12 186 lb (84.369 kg)  08/23/12 187 lb 3.2 oz (84.913 kg)     General appearance: Well-nourished Caucasian man HENNT: Pharynx no erythema or exudate Lymph nodes: No adenopathy Breasts: Lungs: Clear to auscultation, resonant to percussion Heart: Regular rhythm, 2/6 aortic systolic murmur Abdomen: Soft, nontender, no mass, no organomegaly Extremities: No edema, no calf tenderness Vascular: No cyanosis Neurologic: Mental status intact, PERRLA, motor strength 5 over 5, reflexes absent symmetric at the knees, 1+ symmetric at the biceps, vibration sensation mild to moderately decreased by tuning fork exam over the fingertips Skin: No rash or ecchymosis  Lab Results: Lab Results  Component Value Date   WBC 5.6 11/21/2012   HGB 13.5 11/21/2012   HCT 39.0 11/21/2012   MCV 91.1 11/21/2012   PLT 183 11/21/2012     Chemistry      Component Value Date/Time   NA 142 11/21/2012 1036   NA 141 08/01/2012 1118   K 3.8 11/21/2012 1036   K 3.7 08/01/2012 1118   CL 103 11/21/2012 1036   CL 102 08/01/2012 1118  CO2 28 11/21/2012 1036   CO2 31 08/01/2012 1118   BUN 17.0 11/21/2012 1036   BUN 15 08/01/2012 1118   CREATININE 0.8 11/21/2012 1036   CREATININE 0.81 08/01/2012 1118   CREATININE 0.87 07/30/2011 0733   CREATININE 0.87 07/30/2011 0733   CREATININE 0.87 07/30/2011 0733      Component Value Date/Time     CALCIUM 10.2 11/21/2012 1036   CALCIUM 10.6* 08/01/2012 1118   ALKPHOS 56 11/21/2012 1036   ALKPHOS 47 08/02/2012 1042   AST 24 11/21/2012 1036   AST 23 08/02/2012 1042   ALT 18 11/21/2012 1036   ALT 19 08/02/2012 1042   BILITOT 0.92 11/21/2012 1036   BILITOT 1.0 08/02/2012 1042    IgM level pending   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  He started on the Rituxan in April 2012. He will complete 2 years of the treatment in March 2014. I would like to stop treatment at that point and observe.  #2. Pulmonary sarcoidosis.  I feel it is likely that the underlying immune deficit associated with his sarcoidosis may also be responsible for the CIDP. As noted above, he is at stabilization of pulmonary symptoms since being on the Rituxan.  #3. 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  IgM level has been stable on serial measurements .   #4. Coronary artery disease status post MI status post right coronary drug-eluting stent   #5. Essential hypertension   #6. Benign prostatic hyperplasia   #7. Adenomatous polyps of the colon    CC:. Dr. Illene Regulus; Dr. Porfirio Mylar Dohmeier; Dr. Tonny Bollman; Dr. [name] fax [phone]     CC:.    [name], MD 11/26/20134:52 PM

## 2012-11-21 NOTE — Patient Instructions (Signed)
Next Rituxan 01/16/13 then 03/20/13 then we will take a break

## 2012-11-21 NOTE — Telephone Encounter (Signed)
appts made and printed for pt,pt aware that i will call with 03/20/13 chemo.  Dr g gave ok for 3/28 as the pt is not avail 3/25.  email to mw

## 2012-11-21 NOTE — Telephone Encounter (Signed)
Per staff message and POF I have scheduled appts.  JMW  

## 2012-11-22 ENCOUNTER — Telehealth: Payer: Self-pay | Admitting: Oncology

## 2012-11-22 NOTE — Telephone Encounter (Signed)
called and lm with  3/28 appt time and other appts printed on 1/26

## 2012-12-07 ENCOUNTER — Other Ambulatory Visit: Payer: Self-pay | Admitting: Internal Medicine

## 2012-12-27 DIAGNOSIS — H4010X0 Unspecified open-angle glaucoma, stage unspecified: Secondary | ICD-10-CM

## 2012-12-27 HISTORY — DX: Unspecified open-angle glaucoma, stage unspecified: H40.10X0

## 2013-01-08 ENCOUNTER — Other Ambulatory Visit: Payer: Self-pay | Admitting: Internal Medicine

## 2013-01-09 ENCOUNTER — Other Ambulatory Visit: Payer: Self-pay | Admitting: *Deleted

## 2013-01-09 ENCOUNTER — Other Ambulatory Visit: Payer: Self-pay | Admitting: Internal Medicine

## 2013-01-09 MED ORDER — FLUTICASONE PROPIONATE 50 MCG/ACT NA SUSP: 2.0000 | Freq: Two times a day (BID) | NASAL | Status: DC

## 2013-01-13 DIAGNOSIS — H66009 Acute suppurative otitis media without spontaneous rupture of ear drum, unspecified ear: Secondary | ICD-10-CM | POA: Diagnosis not present

## 2013-01-15 ENCOUNTER — Other Ambulatory Visit: Payer: Self-pay | Admitting: Oncology

## 2013-01-15 DIAGNOSIS — G619 Inflammatory polyneuropathy, unspecified: Secondary | ICD-10-CM

## 2013-01-15 DIAGNOSIS — G622 Polyneuropathy due to other toxic agents: Secondary | ICD-10-CM

## 2013-01-16 ENCOUNTER — Ambulatory Visit (HOSPITAL_BASED_OUTPATIENT_CLINIC_OR_DEPARTMENT_OTHER): Payer: Medicare Other | Admitting: Nurse Practitioner

## 2013-01-16 ENCOUNTER — Telehealth: Payer: Self-pay | Admitting: Oncology

## 2013-01-16 ENCOUNTER — Other Ambulatory Visit (HOSPITAL_BASED_OUTPATIENT_CLINIC_OR_DEPARTMENT_OTHER): Payer: Medicare Other | Admitting: Lab

## 2013-01-16 ENCOUNTER — Other Ambulatory Visit: Payer: Self-pay | Admitting: Nurse Practitioner

## 2013-01-16 ENCOUNTER — Ambulatory Visit (HOSPITAL_BASED_OUTPATIENT_CLINIC_OR_DEPARTMENT_OTHER): Payer: Medicare Other

## 2013-01-16 VITALS — BP 132/66 | HR 57 | Temp 98.2°F | Resp 18

## 2013-01-16 VITALS — BP 145/75 | HR 61 | Temp 97.8°F | Resp 18 | Ht 70.0 in | Wt 186.1 lb

## 2013-01-16 DIAGNOSIS — G622 Polyneuropathy due to other toxic agents: Secondary | ICD-10-CM | POA: Diagnosis not present

## 2013-01-16 DIAGNOSIS — D869 Sarcoidosis, unspecified: Secondary | ICD-10-CM

## 2013-01-16 DIAGNOSIS — G619 Inflammatory polyneuropathy, unspecified: Secondary | ICD-10-CM

## 2013-01-16 DIAGNOSIS — G6181 Chronic inflammatory demyelinating polyneuritis: Secondary | ICD-10-CM | POA: Diagnosis not present

## 2013-01-16 DIAGNOSIS — D472 Monoclonal gammopathy: Secondary | ICD-10-CM | POA: Diagnosis not present

## 2013-01-16 DIAGNOSIS — Z5112 Encounter for antineoplastic immunotherapy: Secondary | ICD-10-CM | POA: Diagnosis not present

## 2013-01-16 DIAGNOSIS — D126 Benign neoplasm of colon, unspecified: Secondary | ICD-10-CM

## 2013-01-16 LAB — CBC WITH DIFFERENTIAL/PLATELET
BASO%: 0.1 % (ref 0.0–2.0)
Basophils Absolute: 0 10*3/uL (ref 0.0–0.1)
EOS%: 0.3 % (ref 0.0–7.0)
Eosinophils Absolute: 0 10*3/uL (ref 0.0–0.5)
HCT: 40.7 % (ref 38.4–49.9)
HGB: 14 g/dL (ref 13.0–17.1)
LYMPH%: 8.9 % — ABNORMAL LOW (ref 14.0–49.0)
MCH: 31 pg (ref 27.2–33.4)
MCHC: 34.4 g/dL (ref 32.0–36.0)
MCV: 90 fL (ref 79.3–98.0)
MONO#: 0.7 10*3/uL (ref 0.1–0.9)
MONO%: 9.7 % (ref 0.0–14.0)
NEUT#: 5.5 10*3/uL (ref 1.5–6.5)
NEUT%: 81 % — ABNORMAL HIGH (ref 39.0–75.0)
Platelets: 215 10*3/uL (ref 140–400)
RBC: 4.52 10*6/uL (ref 4.20–5.82)
RDW: 12.5 % (ref 11.0–14.6)
WBC: 6.8 10*3/uL (ref 4.0–10.3)
lymph#: 0.6 10*3/uL — ABNORMAL LOW (ref 0.9–3.3)
nRBC: 0 % (ref 0–0)

## 2013-01-16 LAB — COMPREHENSIVE METABOLIC PANEL (CC13)
ALT: 16 U/L (ref 0–55)
AST: 17 U/L (ref 5–34)
Albumin: 4 g/dL (ref 3.5–5.0)
Alkaline Phosphatase: 64 U/L (ref 40–150)
BUN: 21 mg/dL (ref 7.0–26.0)
CO2: 27 mEq/L (ref 22–29)
Calcium: 10.7 mg/dL — ABNORMAL HIGH (ref 8.4–10.4)
Chloride: 101 mEq/L (ref 98–107)
Creatinine: 0.8 mg/dL (ref 0.7–1.3)
Glucose: 103 mg/dl — ABNORMAL HIGH (ref 70–99)
Potassium: 4.1 mEq/L (ref 3.5–5.1)
Sodium: 137 mEq/L (ref 136–145)
Total Bilirubin: 0.57 mg/dL (ref 0.20–1.20)
Total Protein: 7.4 g/dL (ref 6.4–8.3)

## 2013-01-16 LAB — LACTATE DEHYDROGENASE (CC13): LDH: 166 U/L (ref 125–245)

## 2013-01-16 MED ORDER — SODIUM CHLORIDE 0.9 % IV SOLN
375.0000 mg/m2 | Freq: Once | INTRAVENOUS | Status: AC
  Administered 2013-01-16: 800 mg via INTRAVENOUS
  Filled 2013-01-16: qty 80

## 2013-01-16 MED ORDER — DIPHENHYDRAMINE HCL 25 MG PO CAPS
50.0000 mg | ORAL_CAPSULE | Freq: Once | ORAL | Status: AC
  Administered 2013-01-16: 50 mg via ORAL

## 2013-01-16 MED ORDER — SODIUM CHLORIDE 0.9 % IV SOLN: 375.0000 mg/m2 | Freq: Once | INTRAVENOUS | Status: DC

## 2013-01-16 MED ORDER — ACETAMINOPHEN 325 MG PO TABS
650.0000 mg | ORAL_TABLET | Freq: Once | ORAL | Status: AC
  Administered 2013-01-16: 650 mg via ORAL

## 2013-01-16 MED ORDER — SODIUM CHLORIDE 0.9 % IV SOLN
Freq: Once | INTRAVENOUS | Status: AC
  Administered 2013-01-16: 10:00:00 via INTRAVENOUS

## 2013-01-16 NOTE — Progress Notes (Signed)
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 He has a painful distal neuropathy involving the feet worse than the hands. He was given an initial trial of Rituxan in 2010 with no significant improvement. Rituxan was reinitiated April 2012 with a significant improvement noted. He is now receiving Rituxan on a 2 month schedule.  He overall feels well. He is walking 30 minutes every day. The neuropathic pain in the lower legs and feet is well-controlled with gabapentin. He has a good appetite and good energy level. He had a recent inner ear infection and is currently completing a course of Augmentin. He is also completing a prednisone taper. No signs of an allergic reaction with previous Rituxan infusions.   Objective: Blood pressure 145/75, pulse 61, temperature 97.8 F (36.6 C), temperature source Oral, resp. rate 18, height 5\' 10"  (1.778 m), weight 186 lb 1.6 oz (84.414 kg).  Oropharynx is without thrush or ulceration. No palpable cervical, supraclavicular or axillary lymph nodes. Lungs are clear. No wheezes or rales. Regular cardiac rhythm. Abdomen is soft and nontender. No organomegaly. Calves are soft and nontender. Motor strength 5 over 5. Knee DTRs absent at the knees symmetrically.  Lab Results: Lab Results  Component Value Date   WBC 6.8 01/16/2013   HGB 14.0 01/16/2013   HCT 40.7 01/16/2013   MCV 90.0 01/16/2013   PLT 215 01/16/2013    Chemistry:    Chemistry      Component Value Date/Time   NA 142 11/21/2012 1036   NA 141 08/01/2012 1118   K 3.8 11/21/2012 1036   K 3.7 08/01/2012 1118   CL 103 11/21/2012 1036   CL 102 08/01/2012 1118   CO2 28 11/21/2012 1036   CO2 31 08/01/2012 1118   BUN 17.0 11/21/2012 1036   BUN 15 08/01/2012 1118   CREATININE 0.8 11/21/2012 1036   CREATININE 0.81 08/01/2012 1118   CREATININE 0.87 07/30/2011 0733   CREATININE 0.87  07/30/2011 0733   CREATININE 0.87 07/30/2011 0733      Component Value Date/Time   CALCIUM 10.2 11/21/2012 1036   CALCIUM 10.6* 08/01/2012 1118   ALKPHOS 56 11/21/2012 1036   ALKPHOS 47 08/02/2012 1042   AST 24 11/21/2012 1036   AST 23 08/02/2012 1042   ALT 18 11/21/2012 1036   ALT 19 08/02/2012 1042   BILITOT 0.92 11/21/2012 1036   BILITOT 1.0 08/02/2012 1042       Studies/Results: No results found.  Medications: I have reviewed the patient's current medications.  Assessment/Plan:  1. Chronic inflammatory demyelinating polyneuropathy. Symptoms improved on Rituxan. 2. Pulmonary sarcoidosis. 3. 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 Most recent value stable. 4. Coronary artery disease status post MI, status post right coronary drug-eluting stent. 5. Hypertension. 6. BPH. 7. Adenomatous polyps of the colon.  Disposition-Mr. Shuck appears stable. Plan to proceed with Rituxan today as scheduled. He will return for the final treatment to complete 2 years of Rituxan therapy on 03/23/2013. He will return for a followup visit with Dr. Cyndie Chime on 03/30/2013. He will contact the office in the interim with any problems.  Lonna Cobb ANP/GNP-BC

## 2013-01-16 NOTE — Patient Instructions (Signed)
[facility] Discharge Instructions for Patients Receiving Chemotherapy  Today you received the following chemotherapy agents Rituxan If you develop nausea and vomiting that is not controlled by your nausea medication, call the clinic. If it is after clinic hours your family physician or the after hours number for the clinic or go to the Emergency Department.   BELOW ARE SYMPTOMS THAT SHOULD BE REPORTED IMMEDIATELY:  *FEVER GREATER THAN 100.5 F  *CHILLS WITH OR WITHOUT FEVER  NAUSEA AND VOMITING THAT IS NOT CONTROLLED WITH YOUR NAUSEA MEDICATION  *UNUSUAL SHORTNESS OF BREATH  *UNUSUAL BRUISING OR BLEEDING  TENDERNESS IN MOUTH AND THROAT WITH OR WITHOUT PRESENCE OF ULCERS  *URINARY PROBLEMS  *BOWEL PROBLEMS  UNUSUAL RASH Items with * indicate a potential emergency and should be followed up as soon as possible.  One of the nurses will contact you 24 hours after your treatment. Please let the nurse know about any problems that you may have experienced. Feel free to call the clinic you have any questions or concerns. The clinic phone number is [phone].   I have been informed and understand all the instructions given to me. I know to contact the clinic, my physician, or go to the Emergency Department if any problems should occur. I do not have any questions at this time, but understand that I may call the clinic during office hours   should I have any questions or need assistance in obtaining follow up care.    __________________________________________  _____________  __________ Signature of Patient or Authorized Representative            Date                   Time    __________________________________________ Nurse's Signature    

## 2013-01-16 NOTE — Telephone Encounter (Signed)
gv and printed pt appt schedule for march and April...the patient ok

## 2013-01-17 ENCOUNTER — Ambulatory Visit (INDEPENDENT_AMBULATORY_CARE_PROVIDER_SITE_OTHER): Payer: Medicare Other | Admitting: Physician Assistant

## 2013-01-17 DIAGNOSIS — I2510 Atherosclerotic heart disease of native coronary artery without angina pectoris: Secondary | ICD-10-CM | POA: Diagnosis not present

## 2013-01-17 DIAGNOSIS — R0989 Other specified symptoms and signs involving the circulatory and respiratory systems: Secondary | ICD-10-CM

## 2013-01-17 NOTE — Progress Notes (Signed)
Exercise Treadmill Test  Pre-Exercise Testing Evaluation Rhythm: normal sinus  Rate: 55                 Test  Exercise Tolerance Test Ordering MD: Tonny Bollman, MD  Interpreting MD: Tereso Newcomer, PA-C  Unique Test No: 1  Treadmill:  1  Indication for ETT: CAD  Contraindication to ETT: No   Stress Modality: exercise - treadmill  Cardiac Imaging Performed: non   Protocol: standard Bruce - maximal  Max BP:  161/74  Max MPHR (bpm):  151 85% MPR (bpm):  128  MPHR obtained (bpm):  141 % MPHR obtained:  93%  Reached 85% MPHR (min:sec):  7:01 Total Exercise Time (min-sec):  9:00  Workload in METS:  10.1 Borg Scale: 14  Reason ETT Terminated:  desired heart rate attained    ST Segment Analysis At Rest: normal ST segments - no evidence of significant ST depression With Exercise: borderline ST changes  Other Information Arrhythmia:  2 ventricular couplets noted Angina during ETT:  absent (0) Quality of ETT:  indeterminate  ETT Interpretation:  borderline (indeterminate) with non-specific ST changes  Comments: Good exercise tolerance. No chest pain.  He did note some interscapular back pain after ETT was completed.  He states this is a chronic problem without change. Normal BP response to exercise. Borderline ST changes.  There were approx 1 mm of inf-lat ST depression at max effort.  This persisted for approx 5 minutes into recovery.  Recommendations: Discussed findings with Dr. Tonny Bollman. He will review ECGs and make further recommendations. Discussed with patient. Signed,  Tereso Newcomer, PA-C  2:38 PM 01/17/2013

## 2013-01-18 LAB — IMMUNOFIXATION ELECTROPHORESIS
IgA: 161 mg/dL (ref 68–379)
IgG (Immunoglobin G), Serum: 860 mg/dL (ref 650–1600)
IgM, Serum: 300 mg/dL — ABNORMAL HIGH (ref 41–251)
Total Protein, Serum Electrophoresis: 7.4 g/dL (ref 6.0–8.3)

## 2013-01-19 ENCOUNTER — Telehealth: Payer: Self-pay | Admitting: Cardiovascular Disease

## 2013-01-19 NOTE — Telephone Encounter (Signed)
Reviewed EKG tracings from his treadmill test. He has 1 mm of ST depression resolving and early recovery phase. I think we can continue to manage him medically. I have discussed this with him and he is having no exertional symptoms. The patient is walking 30 minutes daily without problems. I would like to see him back in 6 months for followup. Recommend check a lipid panel and LFTs before he returns for his followup visit in 6 months.

## 2013-01-19 NOTE — Telephone Encounter (Signed)
Recalls for appointment placed in the system.

## 2013-01-19 NOTE — Telephone Encounter (Signed)
New problem:   Treadmill test on  Wednesday . Was told by  that Dr. Excell Seltzer will review and get back to him today for test results.  Patient stating he's leaving town today Would like to hear back.

## 2013-01-22 ENCOUNTER — Telehealth: Payer: Self-pay | Admitting: *Deleted

## 2013-01-22 NOTE — Telephone Encounter (Signed)
Spoke with pt.  Let him know that antibody levels are stable - only borderline increase.  He appreciated the call.

## 2013-01-22 NOTE — Telephone Encounter (Signed)
Message copied by Orbie Hurst on Mon Jan 22, 2013  9:39 AM ------      Message from: [name]      Created: Thu Jan 18, 2013  4:50 PM       Call patient - antibody levels stable - only borderline increased

## 2013-01-24 ENCOUNTER — Telehealth: Payer: Self-pay | Admitting: *Deleted

## 2013-01-24 NOTE — Telephone Encounter (Deleted)
Message copied by Sabino Snipes on Wed Jan 24, 2013  1:39 PM ------      Message from: [name]      Created: Thu Jan 18, 2013  4:50 PM       Call patient - antibody levels stable - only borderline increased

## 2013-01-30 DIAGNOSIS — G622 Polyneuropathy due to other toxic agents: Secondary | ICD-10-CM | POA: Diagnosis not present

## 2013-01-30 DIAGNOSIS — G6189 Other inflammatory polyneuropathies: Secondary | ICD-10-CM | POA: Diagnosis not present

## 2013-01-30 DIAGNOSIS — G6181 Chronic inflammatory demyelinating polyneuritis: Secondary | ICD-10-CM | POA: Diagnosis not present

## 2013-01-30 DIAGNOSIS — M5137 Other intervertebral disc degeneration, lumbosacral region: Secondary | ICD-10-CM | POA: Diagnosis not present

## 2013-01-30 DIAGNOSIS — G4733 Obstructive sleep apnea (adult) (pediatric): Secondary | ICD-10-CM | POA: Diagnosis not present

## 2013-02-02 ENCOUNTER — Other Ambulatory Visit: Payer: Self-pay | Admitting: Neurology

## 2013-02-02 DIAGNOSIS — M5137 Other intervertebral disc degeneration, lumbosacral region: Secondary | ICD-10-CM

## 2013-02-12 ENCOUNTER — Encounter: Payer: Self-pay | Admitting: Cardiovascular Disease

## 2013-02-12 NOTE — Telephone Encounter (Signed)
New problem   Fax sent on 2/7. Checking on status of form.

## 2013-02-12 NOTE — Telephone Encounter (Signed)
This encounter was created in error - please disregard.

## 2013-02-16 ENCOUNTER — Telehealth: Payer: Self-pay | Admitting: Oncology

## 2013-02-16 NOTE — Telephone Encounter (Signed)
lvm for pt regarding to Dr Gerome Apley [phone] [address] [place] [address] to call and get an appointment to get primary care.

## 2013-02-26 ENCOUNTER — Ambulatory Visit
Admission: RE | Admit: 2013-02-26 | Discharge: 2013-02-26 | Disposition: A | Discharge status: Home / Self Care | Payer: Medicare Other | Source: Ambulatory Visit | Attending: Neurology | Admitting: Neurology

## 2013-02-26 VITALS — BP 154/77 | HR 57

## 2013-02-26 DIAGNOSIS — M5137 Other intervertebral disc degeneration, lumbosacral region: Secondary | ICD-10-CM

## 2013-02-26 MED ORDER — METHYLPREDNISOLONE ACETATE 40 MG/ML INJ SUSP (RADIOLOG
120.0000 mg | Freq: Once | INTRAMUSCULAR | Status: AC
  Administered 2013-02-26: 120 mg via EPIDURAL

## 2013-02-26 MED ORDER — IOHEXOL 180 MG/ML  SOLN
1.0000 mL | Freq: Once | INTRAMUSCULAR | Status: AC | PRN
  Administered 2013-02-26: 1 mL via EPIDURAL

## 2013-03-04 ENCOUNTER — Other Ambulatory Visit: Payer: Self-pay | Admitting: Internal Medicine

## 2013-03-16 ENCOUNTER — Other Ambulatory Visit: Payer: Self-pay | Admitting: Oncology

## 2013-03-23 ENCOUNTER — Other Ambulatory Visit (HOSPITAL_BASED_OUTPATIENT_CLINIC_OR_DEPARTMENT_OTHER): Payer: Medicare Other

## 2013-03-23 ENCOUNTER — Ambulatory Visit (HOSPITAL_BASED_OUTPATIENT_CLINIC_OR_DEPARTMENT_OTHER): Payer: Medicare Other

## 2013-03-23 VITALS — BP 122/70 | HR 54 | Temp 98.3°F | Resp 18

## 2013-03-23 DIAGNOSIS — D869 Sarcoidosis, unspecified: Secondary | ICD-10-CM

## 2013-03-23 DIAGNOSIS — D472 Monoclonal gammopathy: Secondary | ICD-10-CM

## 2013-03-23 DIAGNOSIS — G619 Inflammatory polyneuropathy, unspecified: Secondary | ICD-10-CM

## 2013-03-23 DIAGNOSIS — Z5112 Encounter for antineoplastic immunotherapy: Secondary | ICD-10-CM | POA: Diagnosis not present

## 2013-03-23 DIAGNOSIS — G622 Polyneuropathy due to other toxic agents: Secondary | ICD-10-CM

## 2013-03-23 LAB — COMPREHENSIVE METABOLIC PANEL (CC13)
ALT: 20 U/L (ref 0–55)
AST: 21 U/L (ref 5–34)
Albumin: 3.8 g/dL (ref 3.5–5.0)
Alkaline Phosphatase: 64 U/L (ref 40–150)
BUN: 16 mg/dL (ref 7.0–26.0)
CO2: 28 mEq/L (ref 22–29)
Calcium: 10.1 mg/dL (ref 8.4–10.4)
Chloride: 104 mEq/L (ref 98–107)
Creatinine: 0.8 mg/dL (ref 0.7–1.3)
Glucose: 98 mg/dl (ref 70–99)
Potassium: 3.8 mEq/L (ref 3.5–5.1)
Sodium: 141 mEq/L (ref 136–145)
Total Bilirubin: 0.97 mg/dL (ref 0.20–1.20)
Total Protein: 6.9 g/dL (ref 6.4–8.3)

## 2013-03-23 LAB — CBC WITH DIFFERENTIAL/PLATELET
BASO%: 1.1 % (ref 0.0–2.0)
Basophils Absolute: 0 10*3/uL (ref 0.0–0.1)
EOS%: 7.1 % — ABNORMAL HIGH (ref 0.0–7.0)
Eosinophils Absolute: 0.3 10*3/uL (ref 0.0–0.5)
HCT: 40.5 % (ref 38.4–49.9)
HGB: 14.1 g/dL (ref 13.0–17.1)
LYMPH%: 13.9 % — ABNORMAL LOW (ref 14.0–49.0)
MCH: 31.4 pg (ref 27.2–33.4)
MCHC: 34.8 g/dL (ref 32.0–36.0)
MCV: 90.2 fL (ref 79.3–98.0)
MONO#: 0.6 10*3/uL (ref 0.1–0.9)
MONO%: 14.7 % — ABNORMAL HIGH (ref 0.0–14.0)
NEUT#: 2.4 10*3/uL (ref 1.5–6.5)
NEUT%: 63.2 % (ref 39.0–75.0)
Platelets: 176 10*3/uL (ref 140–400)
RBC: 4.49 10*6/uL (ref 4.20–5.82)
RDW: 13 % (ref 11.0–14.6)
WBC: 3.8 10*3/uL — ABNORMAL LOW (ref 4.0–10.3)
lymph#: 0.5 10*3/uL — ABNORMAL LOW (ref 0.9–3.3)
nRBC: 0 % (ref 0–0)

## 2013-03-23 LAB — LACTATE DEHYDROGENASE (CC13): LDH: 193 U/L (ref 125–245)

## 2013-03-23 MED ORDER — SODIUM CHLORIDE 0.9 % IV SOLN
Freq: Once | INTRAVENOUS | Status: AC
  Administered 2013-03-23: 09:00:00 via INTRAVENOUS

## 2013-03-23 MED ORDER — ACETAMINOPHEN 325 MG PO TABS
650.0000 mg | ORAL_TABLET | Freq: Once | ORAL | Status: AC
  Administered 2013-03-23: 650 mg via ORAL

## 2013-03-23 MED ORDER — DIPHENHYDRAMINE HCL 25 MG PO CAPS
50.0000 mg | ORAL_CAPSULE | Freq: Once | ORAL | Status: AC
  Administered 2013-03-23: 50 mg via ORAL

## 2013-03-23 MED ORDER — SODIUM CHLORIDE 0.9 % IV SOLN
375.0000 mg/m2 | Freq: Once | INTRAVENOUS | Status: AC
  Administered 2013-03-23: 800 mg via INTRAVENOUS
  Filled 2013-03-23: qty 80

## 2013-03-23 NOTE — Patient Instructions (Signed)
[facility] Discharge Instructions for Patients Receiving Chemotherapy  Today you received the following chemotherapy agents  RITUXAN  To help prevent nausea and vomiting after your treatment, we encourage you to take your nausea medication AS DIRECTED   If you develop nausea and vomiting that is not controlled by your nausea medication, call the clinic. If it is after clinic hours your family physician or the after hours number for the clinic or go to the Emergency Department.   BELOW ARE SYMPTOMS THAT SHOULD BE REPORTED IMMEDIATELY:  *FEVER GREATER THAN 100.5 F  *CHILLS WITH OR WITHOUT FEVER  NAUSEA AND VOMITING THAT IS NOT CONTROLLED WITH YOUR NAUSEA MEDICATION  *UNUSUAL SHORTNESS OF BREATH  *UNUSUAL BRUISING OR BLEEDING  TENDERNESS IN MOUTH AND THROAT WITH OR WITHOUT PRESENCE OF ULCERS  *URINARY PROBLEMS  *BOWEL PROBLEMS  UNUSUAL RASH Items with * indicate a potential emergency and should be followed up as soon as possible.  One of the nurses will contact you 24 hours after your treatment. Please let the nurse know about any problems that you may have experienced. Feel free to call the clinic you have any questions or concerns. The clinic phone number is [phone].   I have been informed and understand all the instructions given to me. I know to contact the clinic, my physician, or go to the Emergency Department if any problems should occur. I do not have any questions at this time, but understand that I may call the clinic during office hours   should I have any questions or need assistance in obtaining follow up care.    __________________________________________  _____________  __________ Signature of Patient or Authorized Representative            Date                   Time    __________________________________________ Nurse's Signature

## 2013-03-29 DIAGNOSIS — H409 Unspecified glaucoma: Secondary | ICD-10-CM | POA: Diagnosis not present

## 2013-03-29 DIAGNOSIS — H40059 Ocular hypertension, unspecified eye: Secondary | ICD-10-CM | POA: Diagnosis not present

## 2013-03-29 DIAGNOSIS — H4011X0 Primary open-angle glaucoma, stage unspecified: Secondary | ICD-10-CM | POA: Diagnosis not present

## 2013-03-30 ENCOUNTER — Telehealth: Payer: Self-pay | Admitting: Oncology

## 2013-03-30 ENCOUNTER — Ambulatory Visit (HOSPITAL_BASED_OUTPATIENT_CLINIC_OR_DEPARTMENT_OTHER): Payer: Medicare Other | Admitting: Oncology

## 2013-03-30 ENCOUNTER — Ambulatory Visit: Payer: Medicare Other | Admitting: Oncology

## 2013-03-30 VITALS — BP 122/64 | HR 61 | Temp 98.3°F | Resp 20 | Ht 70.0 in | Wt 189.6 lb

## 2013-03-30 DIAGNOSIS — D472 Monoclonal gammopathy: Secondary | ICD-10-CM

## 2013-03-30 DIAGNOSIS — D869 Sarcoidosis, unspecified: Secondary | ICD-10-CM | POA: Diagnosis not present

## 2013-03-30 DIAGNOSIS — C186 Malignant neoplasm of descending colon: Secondary | ICD-10-CM | POA: Diagnosis not present

## 2013-03-30 NOTE — Telephone Encounter (Signed)
gv and printed appt schedule for pt for July and Oct

## 2013-04-01 NOTE — Progress Notes (Signed)
Hematology and Oncology Follow Up Visit  Derrick [name] [id number] 1941-05-07 72 y.o. 04/01/2013 10:31 AM   Principle Diagnosis: Encounter Diagnoses  Name Primary?  . Malignant neoplasm of descending colon Yes  . MGUS (monoclonal gammopathy of unknown significance)   . 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 He does have an IgM monoclonal gammopathy of undetermined significance with borderline elevation of total IgM which has remained stable over time. Please see previous office notes for full details of previous evaluations and treatments. He was put back on a trial of Rituxan in April 2012 and had coincidental improvement in his symptoms. He had progressive improvement in his ability to ambulate him. Dysesthesias of his feet have improved. He still has some problems with balance. He reports no new symptoms today. No interim fever or infection. No cough or dyspnea. No exacerbation of chronic paresthesias and dysesthesias. He feels better now than he has in many months.  He did have recent eye evaluation by Dr. Eulah [name] and was told that he had an increased ocular pressure and started on some glaucoma medication.  Medications: reviewed  Allergies: No Known Allergies  Review of Systems: Constitutional:   No constitutional symptoms Respiratory: No cough or dyspnea Cardiovascular:  No chest pain or palpitations Gastrointestinal: No abdominal pain or change in bowel habit Genito-Urinary: Improved urine output on Proscar Musculoskeletal: No muscle bone or joint pain Neurologic: See above Skin: No rash or ecchymosis Remaining ROS negative.  Physical Exam: Blood pressure 122/64, pulse 61, temperature 98.3 F (36.8 C), temperature source Oral, resp. rate 20, height 5\' 10"  (1.778 m), weight 189 lb 9.6 oz (86.002 kg). Wt  Readings from Last 3 Encounters:  03/30/13 189 lb 9.6 oz (86.002 kg)  01/16/13 186 lb 1.6 oz (84.414 kg)  11/21/12 186 lb 1.6 oz (84.414 kg)     General appearance: Well-nourished Caucasian man HENNT: Pharynx no erythema or exudate Lymph nodes: No lymphadenopathy, some nonspecific soft tissue nodularity left axilla Breasts: Lungs: Clear to auscultation resonant to percussion Heart: Regular rhythm 2/6 aortic systolic murmur or gallop Abdomen: Soft, nontender, no mass, no organomegaly Extremities: No edema, no calf tenderness Vascular: No cyanosis Neurologic: Alert and oriented, cranial nerves grossly normal, motor strength is 5 over 5 all extremities, reflexes absent symmetric at the knees 1+ symmetric at the biceps, there is a moderate decrease in vibration sensation over the fingertips by tuning fork exam. I was unable to get a good view of his fundi. Skin:  Lab Results: Lab Results  Component Value Date   WBC 3.8* 03/23/2013   HGB 14.1 03/23/2013   HCT 40.5 03/23/2013   MCV 90.2 03/23/2013   PLT 176 03/23/2013     Chemistry      Component Value Date/Time   NA 141 03/23/2013 0855   NA 141 08/01/2012 1118   K 3.8 03/23/2013 0855   K 3.7 08/01/2012 1118   CL 104 03/23/2013 0855   CL 102 08/01/2012 1118   CO2 28 03/23/2013 0855   CO2 31 08/01/2012 1118   BUN 16.0 03/23/2013 0855   BUN 15 08/01/2012 1118   CREATININE 0.8 03/23/2013 0855   CREATININE 0.81 08/01/2012 1118   CREATININE 0.87 07/30/2011 0733      Component Value Date/Time   CALCIUM 10.1 03/23/2013 0855   CALCIUM 10.6* 08/01/2012 1118   ALKPHOS 64  03/23/2013 0855   ALKPHOS 47 08/02/2012 1042   AST 21 03/23/2013 0855   AST 23 08/02/2012 1042   ALT 20 03/23/2013 0855   ALT 19 08/02/2012 1042   BILITOT 0.97 03/23/2013 0855   BILITOT 1.0 08/02/2012 1042    IgM: 300 mg percent on 01/16/2013 today's value pending   Radiological Studies: No results found.  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  He started  on the Rituxan in April 2012. He has now completed  2 years of  treatment as of March 2014.  I would like to stop treatment at this  point and observe.   #2. Pulmonary sarcoidosis.  I feel it is likely that the underlying immune deficit associated with his sarcoidosis may also be responsible for the CIDP. As noted above, has had stabilization of pulmonary symptoms since being on  Rituxan.   #3. 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  IgM level has been stable on serial measurements .   #4. Coronary artery disease status post MI status post right coronary drug-eluting stent  #5. Essential hypertension  #6. Benign prostatic hyperplasia  #7. Adenomatous polyps of the colon  #8. New diagnosis glaucoma March 2014    CC:. Dr. Illene Regulus; Dr. Porfirio Mylar Dohmeier; Dr. Tonny Bollman; Dr. [name] fax [phone]       [name], MD 4/6/201410:31 AM

## 2013-04-14 ENCOUNTER — Other Ambulatory Visit: Payer: Self-pay | Admitting: Internal Medicine

## 2013-05-17 DIAGNOSIS — H409 Unspecified glaucoma: Secondary | ICD-10-CM | POA: Diagnosis not present

## 2013-05-17 DIAGNOSIS — H40059 Ocular hypertension, unspecified eye: Secondary | ICD-10-CM | POA: Diagnosis not present

## 2013-05-17 DIAGNOSIS — H4011X0 Primary open-angle glaucoma, stage unspecified: Secondary | ICD-10-CM | POA: Diagnosis not present

## 2013-06-14 ENCOUNTER — Telehealth: Payer: Self-pay | Admitting: *Deleted

## 2013-06-14 ENCOUNTER — Other Ambulatory Visit: Payer: Self-pay | Admitting: Neurology

## 2013-06-14 DIAGNOSIS — D472 Monoclonal gammopathy: Secondary | ICD-10-CM

## 2013-06-14 DIAGNOSIS — M5416 Radiculopathy, lumbar region: Secondary | ICD-10-CM

## 2013-06-14 NOTE — Telephone Encounter (Signed)
Message copied by Hermenia Fiscal on Thu Jun 14, 2013 11:10 AM ------      Message from: Richrd Prime      Created: Tue Jun 12, 2013 10:53 AM      Regarding: referral for injection      Contact: [phone]       Patient is calling to tell us he needs a referral from Dr. Vickey Huger for his injections.  He says the info is in his chart.  His call back phone number is:  [phone]  ------

## 2013-06-15 NOTE — Telephone Encounter (Signed)
Order was placed yesterday but patient hadn't heard yet from [place]- i called him and let him know L5-S1 inject at [facility] [facility].

## 2013-06-15 NOTE — Telephone Encounter (Signed)
Referral with consult note has been faxed by Natale Milch to [facility].

## 2013-06-17 ENCOUNTER — Other Ambulatory Visit: Payer: Self-pay | Admitting: Neurology

## 2013-06-17 DIAGNOSIS — M5410 Radiculopathy, site unspecified: Secondary | ICD-10-CM

## 2013-06-28 ENCOUNTER — Other Ambulatory Visit: Payer: Medicare Other

## 2013-07-02 ENCOUNTER — Ambulatory Visit (INDEPENDENT_AMBULATORY_CARE_PROVIDER_SITE_OTHER): Payer: Medicare Other | Admitting: *Deleted

## 2013-07-02 ENCOUNTER — Other Ambulatory Visit (HOSPITAL_BASED_OUTPATIENT_CLINIC_OR_DEPARTMENT_OTHER): Payer: Medicare Other

## 2013-07-02 ENCOUNTER — Other Ambulatory Visit: Payer: Self-pay | Admitting: Internal Medicine

## 2013-07-02 DIAGNOSIS — E785 Hyperlipidemia, unspecified: Secondary | ICD-10-CM

## 2013-07-02 DIAGNOSIS — C186 Malignant neoplasm of descending colon: Secondary | ICD-10-CM | POA: Diagnosis not present

## 2013-07-02 DIAGNOSIS — D472 Monoclonal gammopathy: Secondary | ICD-10-CM

## 2013-07-02 LAB — CBC WITH DIFFERENTIAL/PLATELET
BASO%: 0.8 % (ref 0.0–2.0)
Basophils Absolute: 0 10*3/uL (ref 0.0–0.1)
EOS%: 5.6 % (ref 0.0–7.0)
Eosinophils Absolute: 0.2 10*3/uL (ref 0.0–0.5)
HCT: 39.9 % (ref 38.4–49.9)
HGB: 14.1 g/dL (ref 13.0–17.1)
LYMPH%: 16.5 % (ref 14.0–49.0)
MCH: 31.9 pg (ref 27.2–33.4)
MCHC: 35.3 g/dL (ref 32.0–36.0)
MCV: 90.2 fL (ref 79.3–98.0)
MONO#: 0.6 10*3/uL (ref 0.1–0.9)
MONO%: 13.6 % (ref 0.0–14.0)
NEUT#: 2.6 10*3/uL (ref 1.5–6.5)
NEUT%: 63.5 % (ref 39.0–75.0)
Platelets: 203 10*3/uL (ref 140–400)
RBC: 4.42 10*6/uL (ref 4.20–5.82)
RDW: 12.9 % (ref 11.0–14.6)
WBC: 4.2 10*3/uL (ref 4.0–10.3)
lymph#: 0.7 10*3/uL — ABNORMAL LOW (ref 0.9–3.3)

## 2013-07-02 LAB — LIPID PANEL
Cholesterol: 149 mg/dL (ref 0–200)
HDL: 64 mg/dL (ref >39.00)
LDL Cholesterol: 76 mg/dL (ref 0–99)
Total CHOL/HDL Ratio: 2
Triglycerides: 45 mg/dL (ref 0.0–149.0)
VLDL: 9 mg/dL (ref 0.0–40.0)

## 2013-07-02 LAB — COMPREHENSIVE METABOLIC PANEL (CC13)
ALT: 20 U/L (ref 0–55)
AST: 22 U/L (ref 5–34)
Albumin: 4.1 g/dL (ref 3.5–5.0)
Alkaline Phosphatase: 59 U/L (ref 40–150)
BUN: 15.3 mg/dL (ref 7.0–26.0)
CO2: 29 mEq/L (ref 22–29)
Calcium: 10.5 mg/dL — ABNORMAL HIGH (ref 8.4–10.4)
Chloride: 104 mEq/L (ref 98–109)
Creatinine: 0.9 mg/dL (ref 0.7–1.3)
Glucose: 80 mg/dl (ref 70–140)
Potassium: 4 mEq/L (ref 3.5–5.1)
Sodium: 142 mEq/L (ref 136–145)
Total Bilirubin: 0.53 mg/dL (ref 0.20–1.20)
Total Protein: 7 g/dL (ref 6.4–8.3)

## 2013-07-02 LAB — HEPATIC FUNCTION PANEL
ALT: 18 U/L (ref 0–53)
AST: 22 U/L (ref 0–37)
Albumin: 4.3 g/dL (ref 3.5–5.2)
Alkaline Phosphatase: 51 U/L (ref 39–117)
Bilirubin, Direct: 0.1 mg/dL (ref 0.0–0.3)
Total Bilirubin: 0.7 mg/dL (ref 0.3–1.2)
Total Protein: 6.9 g/dL (ref 6.0–8.3)

## 2013-07-03 DIAGNOSIS — H4011X0 Primary open-angle glaucoma, stage unspecified: Secondary | ICD-10-CM | POA: Diagnosis not present

## 2013-07-03 DIAGNOSIS — H409 Unspecified glaucoma: Secondary | ICD-10-CM | POA: Diagnosis not present

## 2013-07-04 LAB — PROTEIN ELECTROPHORESIS, SERUM
Albumin ELP: 63 % (ref 55.8–66.1)
Alpha-1-Globulin: 5.5 % — ABNORMAL HIGH (ref 2.9–4.9)
Alpha-2-Globulin: 9.1 % (ref 7.1–11.8)
Beta 2: 2.9 % — ABNORMAL LOW (ref 3.2–6.5)
Beta Globulin: 6.7 % (ref 4.7–7.2)
Gamma Globulin: 12.8 % (ref 11.1–18.8)
M-Spike, %: 0.29 g/dL
Total Protein, Serum Electrophoresis: 6.8 g/dL (ref 6.0–8.3)

## 2013-07-20 ENCOUNTER — Ambulatory Visit (INDEPENDENT_AMBULATORY_CARE_PROVIDER_SITE_OTHER): Payer: Medicare Other | Admitting: Cardiovascular Disease

## 2013-07-20 VITALS — BP 128/78 | HR 53 | Ht 70.0 in | Wt 186.0 lb

## 2013-07-20 DIAGNOSIS — I2510 Atherosclerotic heart disease of native coronary artery without angina pectoris: Secondary | ICD-10-CM

## 2013-07-20 DIAGNOSIS — R079 Chest pain, unspecified: Secondary | ICD-10-CM | POA: Diagnosis not present

## 2013-07-20 NOTE — Patient Instructions (Addendum)
Your physician has requested that you have en exercise stress myoview in [facility] February 2015.  Please follow instruction sheet, as given.  Your physician wants you to follow-up in: 6-7 months 1 week after stress test You will receive a reminder letter in the mail two months in advance. If you don't receive a letter, please call our office to schedule the follow-up appointment.  Your physician recommends that you continue on your current medications as directed. Please refer to the Current Medication list given to you today.

## 2013-07-21 ENCOUNTER — Encounter: Payer: Self-pay | Admitting: Cardiovascular Disease

## 2013-07-21 NOTE — Progress Notes (Signed)
HPI:  72 year old gentleman present for followup of coronary atherosclerosis. He has a history of myocardial infarction and drug-eluting stent placement in the right coronary artery. 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 He's been managed medically. He has multiple noncardiac problems including CIDP, pulmonary sarcoid, and monoclonal gammopathy of undetermined significance. Despite his problems, he functions at a very high level. He walks for exercise regularly without much symptoms. He does have episodes of left scapular pain that he equates with his angina. He has not required nitroglycerin. His pain pattern has not progressed since last year. He did an exercise treadmill study in January where he exercised for 9 minutes according to the Bruce protocol and achieved 93% of his maximum predicted heart rate. There were borderline ST and T wave changes. He was managed medically. He denies shortness of breath, edema, or palpitations. He has no other complaints today. Lipids from July 2014 showed a cholesterol of 149, HDL 64, LDL 76, and normal liver function studies. Triglycerides were 45.  Outpatient Encounter Prescriptions as of 07/20/2013  Medication Sig Dispense Refill  . albuterol (PROVENTIL HFA) 108 (90 BASE) MCG/ACT inhaler Inhale 2 puffs into the lungs every 6 (six) hours as needed.        . ALPHAGAN P 0.1 % SOLN Place 1 drop into both eyes every 12 (twelve) hours.       [name] amLODipine (NORVASC) 10 MG tablet TAKE ONE TABLET BY MOUTH EVERY DAY  90 tablet  3  . aspirin 81 MG tablet Take 81 mg by mouth daily.        [name] atorvastatin (LIPITOR) 20 MG tablet TAKE ONE TABLET BY MOUTH EVERY DAY  90 tablet  0  . budesonide (PULMICORT) 180 MCG/ACT inhaler Inhale 2 puffs into the lungs 2 (two) times daily.  3 each  3  . CELEBREX 200 MG capsule TAKE ONE CAPSULE BY MOUTH ONCE DAILY  90 capsule  0  . Cholecalciferol (VITAMIN D-3) 5000 UNITS TABS  Take 1 tablet by mouth daily.        . clopidogrel (PLAVIX) 75 MG tablet TAKE ONE TABLET BY MOUTH EVERY DAY  90 tablet  3  . finasteride (PROSCAR) 5 MG tablet Take 5 mg by mouth daily.       . fluticasone (FLONASE) 50 MCG/ACT nasal spray USE TWO SPRAYS IN EACH NOSTRIL TWICE DAILY  48 g  0  . gabapentin (NEURONTIN) 800 MG tablet TAKE ONE TABLET BY MOUTH THREE TIMES DAILY  270 tablet  3  . Multiple Vitamin (MULTIVITAMIN) tablet Take 1 tablet by mouth daily.        . nitroGLYCERIN (NITROSTAT) 0.4 MG SL tablet Place 0.4 mg under the tongue every 5 (five) minutes as needed. Pt advised not to take this while on celebrex      . pantoprazole (PROTONIX) 40 MG tablet Take 1 tablet (40 mg total) by mouth daily.  90 tablet  3  . tadalafil (CIALIS) 5 MG tablet Take 5 mg by mouth daily.      [name] triamterene-hydrochlorothiazide (MAXZIDE-25) 37.5-25 MG per tablet TAKE ONE TABLET BY MOUTH EVERY DAY  90 tablet  1  . [DISCONTINUED] latanoprost (XALATAN) 0.005 % ophthalmic solution       . [DISCONTINUED] RiTUXimab (RITUXAN IV) Inject into the vein. every 2 month        No facility-administered encounter medications on file as of 07/20/2013.    No Known Allergies  Past Medical History  Diagnosis Date  . BENIGN PROSTATIC HYPERTROPHY, WITH OBSTRUCTION 05/28/2010  . COLONIC POLYPS, ADENOMATOUS, HX OF 05/28/2010  . GENERALIZED OSTEOARTHROSIS UNSPECIFIED SITE 05/28/2010  . GERD 05/28/2010  . HYPERLIPIDEMIA 05/28/2010  . Intrinsic asthma, 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  . Malignant neoplasm of descending colon 05/28/2010  . MITRAL VALVE PROLAPSE 05/28/2010  . 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 PALPITATIONS, CHRONIC 05/28/2010  . SLEEP APNEA, OBSTRUCTIVE, MODERATE 05/28/2010  . UNSPECIFIED INFLAMMATORY AND TOXIC NEUROPATHY 05/28/2010  . VITAMIN D DEFICIENCY 05/28/2010  . CAD (coronary artery disease)     a. s/p NSTEMI 06/01/11: DES to RCA;  b. cath 06/25/11:   dLM 50-60% (FFR 0.87), prox to mid LAD 40-50%, D1 50%, pCFX 50%, RCA stent ok, dPDA 80%,  EF 55-60%.  His FFR was felt to be negative and therefore medical therapy was recommended ;  echo 6/12: EF 55-60%, mild AS   . 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 Coronary artery disease 12/11/2011  . MI (myocardial infarction) 12/11/2011  . CIDP (chronic inflammatory demyelinating polyneuropathy) 04/11/2012    ROS: Negative except as per HPI  BP 128/78  Pulse 53  Ht 5\' 10"  (1.778 m)  Wt 84.369 kg (186 lb)  BMI 26.69 kg/m2  PHYSICAL EXAM: Pt is alert and oriented, NAD HEENT: normal Neck: JVP - normal, carotids 2+= without bruits Lungs: CTA bilaterally CV: RRR without murmur or gallop Abd: soft, NT, Positive BS, no hepatomegaly Ext: no C/C/E, distal pulses intact and equal Skin: warm/dry no rash  EKG: sinus bradycardia with first degree AV block, heart rate 53 beats per minute, otherwise within normal limits.  ASSESSMENT AND PLAN: 1. Coronary artery disease, native vessel. This patient has a history of myocardial infarction and primary PCI. He also has moderate left main disease managed medically. He continues to do well on medical therapy and we will continue his same regimen. Since he had borderline changes on his last exercise treadmill and continues with episodes of exertional angina, will pursue a stress Myoview scan prior to his return visit in 6 months. Overall his symptoms are quite stable and he is not limited on a regular basis.  2. Hyperlipidemia. Lipids reviewed as detailed above. Will continue on high intensity statin.  3. Hypertension. Blood pressure is well controlled on multidrug therapy.  Tonny Bollman 07/21/2013 3:09 PM

## 2013-07-30 ENCOUNTER — Other Ambulatory Visit: Payer: Self-pay | Admitting: Internal Medicine

## 2013-07-30 ENCOUNTER — Other Ambulatory Visit: Payer: Self-pay | Admitting: Cardiovascular Disease

## 2013-07-31 ENCOUNTER — Ambulatory Visit: Payer: Self-pay | Admitting: Neurology

## 2013-07-31 ENCOUNTER — Other Ambulatory Visit: Payer: Self-pay | Admitting: *Deleted

## 2013-07-31 MED ORDER — PANTOPRAZOLE SODIUM 40 MG PO TBEC: 40.0000 mg | DELAYED_RELEASE_TABLET | Freq: Every day | ORAL | Status: DC

## 2013-08-08 ENCOUNTER — Encounter: Payer: Self-pay | Admitting: Neurology

## 2013-08-08 ENCOUNTER — Ambulatory Visit (INDEPENDENT_AMBULATORY_CARE_PROVIDER_SITE_OTHER): Payer: Medicare Other | Admitting: Neurology

## 2013-08-08 VITALS — BP 122/68 | HR 55 | Resp 16 | Ht 67.0 in | Wt 188.0 lb

## 2013-08-08 DIAGNOSIS — G63 Polyneuropathy in diseases classified elsewhere: Secondary | ICD-10-CM | POA: Diagnosis not present

## 2013-08-08 DIAGNOSIS — D472 Monoclonal gammopathy: Secondary | ICD-10-CM | POA: Insufficient documentation

## 2013-08-08 NOTE — Progress Notes (Signed)
[facility]  Provider:  Melvyn Novas, M D  Referring Provider: Jacques Navy, MD Primary Care Physician:  Illene Regulus, MD  Chief Complaint  Patient presents with  . 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   HPI:  Derrick [name] is a 72 y.o. male  Is seen here as a referral/ revisit  from Dr. Marlena Clipper , Dr . Debby Bud for follow up on OSA /CPAP and MGUS ( monoclonal  Gammopathy  with neuropathy ). 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 His original sleep study took place in 2000. The patient was Epworth sleepiness score at the of 24 points in the backs and Derrick [name] is an elevated rate of 12 points as well. His BMI at the time was 30. Meanwhile he has lost 65 pounds. His AHI at time of study was 42.1 and his RDI 45.5 his REM AHI was 30 and non-REM 42.9 he did not have any supine sleep. He had oxygen desaturations to a nadir of 82% and had a total desaturation time of 52.6 minutes with a the first 3 hours of sleep the results were indicative of severe sleep apnea the patient was titrated to CPAP on 12-05-2011 at the pressure of only 8 cm. He had REM rebound at this setting and the residual AHI in the night was 0.3 per hour. He also recommended the use of a nasal pillow of possible. Her CPAP was download it in 2013 after 90 days of CPAP he was with a new pressure. He had 100% compliance rate and residual AHI of 3.4 with only moderate to mild air leak that pressure was 8 cm water. There are peak time per day was 8 hours and 48 minutes.  Today be obtained in office another day to the download from her CPAP still set at 8 cm water but was a 3 cm  EPR. The air leak has slightly increased the residual AHI is 2.2 even better than last year therapeutic time of view with his 8 hour and 53 minutes. The patient has 100% compliance. Today he endorsed Epworth sleepiness score at 0 point and the fatigue severity score at only 15 points.  Geriatric depression score was endorsed at he only scored 1 point not suggestive of clinical depression.  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  He did develop not neuropathic pain,  but some and knee pain on the right and an MRI was now showing DDD on the correspondin spinal level.  He has  completed his rituximab treatment with Dr. Marlena Clipper. In February of this year he underwent a total of 3  steroid injections at the L5-S1 distribution which had meanwhile shown a benefit.  He has been on gabapentin for neuropathic pain but he has as baseline and that seems to be related to his MGUS diagnosis  also carries the diagnosis of sarcoidosis and spondylolyses colitis.  Both conditions are autoimmune mediated ,benefiting from monoclonal antibody treatment. He had developed a severe cold intolerance progressively  actually the last year. 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 He has noticed since March and since his Retuximab treatment was completed, that he has some cold cramping as well and toe cramps,  pain and numbness      Review of Systems: Out of a complete 14 system review, the patient complains of only the following symptoms, and all other reviewed systems are negative. The patient no longer has any residual daytime sleepiness, his sleep on CPAP has been restorative. He describes no nocturia.  He reports ongoing problems probably worsening problems with his hands " falling asleep" and  feeling tingly and numb.   This bothers her especially when operating machinery the at the steering wheel and his car or there the handle of his lawnmower.   History   Social History  . Marital Status: Married    Spouse Name: N/A    Number of Children: 2  . Years of Education: MS-Coll.   Occupational History  . retired     Arts development officer   Social History Main Topics  . Smoking status: Never Smoker   . Smokeless tobacco: Never Used  . Alcohol Use: No  . Drug Use: No  . Sexual Activity: Not on file   Other Topics Concern  . Not on file   Social History Narrative   College- [employer]; [employer]-environment science and mgt. Married "2" 1 son- 57; 1 dtr "16" 2 grandchildren. 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 End of life-provided discussive context and provided packet.    Family History  Problem Relation Age of Onset  . Arthritis Mother     severe  . Coronary artery disease Mother   . Coronary artery disease Brother     Past Medical History  Diagnosis Date  . BENIGN PROSTATIC HYPERTROPHY, WITH OBSTRUCTION 05/28/2010  . COLONIC POLYPS, ADENOMATOUS, HX OF 05/28/2010  . GENERALIZED OSTEOARTHROSIS UNSPECIFIED SITE 05/28/2010  . GERD 05/28/2010  . HYPERLIPIDEMIA 05/28/2010  . Intrinsic asthma, 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  . Malignant neoplasm of descending colon 05/28/2010  . MITRAL VALVE PROLAPSE 05/28/2010  . 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 PALPITATIONS, CHRONIC 05/28/2010  . SLEEP APNEA, OBSTRUCTIVE, MODERATE 05/28/2010  . UNSPECIFIED INFLAMMATORY AND TOXIC NEUROPATHY 05/28/2010  . VITAMIN D DEFICIENCY 05/28/2010  . CAD (coronary artery disease)     a. s/p NSTEMI 06/01/11: DES to RCA;  b. cath 06/25/11:   dLM 50-60% (FFR 0.87), prox to mid LAD 40-50%, D1 50%, pCFX 50%, RCA stent ok, dPDA 80%, EF 55-60%.  His FFR was felt to be negative and therefore medical therapy was recommended ;  echo 6/12: EF 55-60%, mild AS   . 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 Coronary artery  disease 12/11/2011  . MI (myocardial infarction) 12/11/2011  . Vision loss   . 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 Sarcoidosis     pulmonalis  . Hypertension   . Parotid gland pain     infection  . Glaucoma 12/2012    open angle    Past Surgical History  Procedure Laterality Date  . Tonsillectomy  1950  . Colon surgery  2000    decending colon 200  . Lung wedge  2001  . Appendectomy  1954  . Knee surgery  2003  . Heart attack  06/03/11  . Cardiac catheterization  06/03/11    stent    Current Outpatient Prescriptions  Medication Sig Dispense Refill  . albuterol (PROVENTIL HFA) 108 (90  BASE) MCG/ACT inhaler Inhale 2 puffs into the lungs every 6 (six) hours as needed.        . ALPHAGAN P 0.1 % SOLN Place 1 drop into both eyes every 12 (twelve) hours.       [name] amLODipine (NORVASC) 10 MG tablet TAKE ONE TABLET BY MOUTH EVERY DAY  90 tablet  3  . aspirin 81 MG tablet Take 81 mg by mouth daily.        [name] atorvastatin (LIPITOR) 20 MG tablet TAKE ONE TABLET BY MOUTH EVERY DAY  90 tablet  0  . budesonide (PULMICORT) 180 MCG/ACT inhaler Inhale 2 puffs into the lungs 2 (two) times daily.  3 each  3  . CELEBREX 200 MG capsule TAKE ONE CAPSULE BY MOUTH ONCE DAILY  90 capsule  0  . CHOLECALCIFEROL PO Take 1,000 mg by mouth daily. Vitamin D 3      . clopidogrel (PLAVIX) 75 MG tablet TAKE ONE TABLET BY MOUTH EVERY DAY  90 tablet  3  . finasteride (PROSCAR) 5 MG tablet Take 5 mg by mouth daily.       . fluticasone (FLONASE) 50 MCG/ACT nasal spray USE TWO SPRAYS IN EACH NOSTRIL TWICE DAILY  48 g  1  . gabapentin (NEURONTIN) 800 MG tablet TAKE ONE TABLET BY MOUTH THREE TIMES DAILY  270 tablet  3  . Multiple Vitamin (MULTIVITAMIN) tablet Take 1 tablet by mouth daily.        . nitroGLYCERIN (NITROSTAT) 0.4 MG SL tablet Place 0.4 mg under the tongue every 5 (five) minutes as needed. Pt advised not to take this while on celebrex      . pantoprazole (PROTONIX) 40 MG tablet  Take 1 tablet (40 mg total) by mouth daily.  90 tablet  3  . tadalafil (CIALIS) 5 MG tablet Take 5 mg by mouth daily.      [name] triamterene-hydrochlorothiazide (MAXZIDE-25) 37.5-25 MG per tablet TAKE ONE TABLET BY MOUTH EVERY DAY  90 tablet  1   No current facility-administered medications for this visit.    Allergies as of 08/08/2013  . (No Known Allergies)    Vitals: BP 122/68  Pulse 55  Resp 16  Ht 5\' 7"  (1.702 m)  Wt 188 lb (85.276 kg)  BMI 29.44 kg/m2 Last Weight:  Wt Readings from Last 1 Encounters:  08/08/13 188 lb (85.276 kg)   Last Height:   Ht Readings from Last 1 Encounters:  08/08/13 5\' 7"  (1.702 m)    Physical exam:  General: The patient is awake, alert and appears not in acute distress. The patient is well groomed. Head: Normocephalic, atraumatic. Neck is supple. Mallampati 1, neck circumference: Cardiovascular:  Regular rate and rhythm, without  murmurs or carotid bruit, and without distended neck veins. Respiratory: Lungs are clear to auscultation. Skin:  Without evidence of edema, or rash Trunk: BMI is reduced, the patient lost over 60 pounds over last 24 month, as a result of cardiac rehabilitation. Neurologic exam : The patient is awake and alert, oriented to place and time.  Memory subjective  described as intact.  There is a normal attention span & concentration ability. Speech is fluent without  dysarthria, dysphonia or aphasia. Mood and affect are appropriate.  Cranial nerves: Pupils are equal and briskly reactive to light. Funduscopic exam without  evidence of pallor or edema. Extraocular movements  in vertical and horizontal planes intact and without nystagmus. Visual fields by finger perimetry are intact. Hearing to finger rub intact.  Facial sensation intact to fine touch. Facial motor strength is symmetric and tongue and uvula move midline.  Motor exam:   Normal tone and normal muscle bulk and symmetric normal strength in all extremities. Good grip  strength is noted.   Sensory:  Fine touch, pinprick and vibration were tested in all extremities.  There is a slight decrease of vibratory sense at the ankle level, but the patient reports profound numbness of his stools. He also has lost proprioception and does not always know where its base his feet are, more recently this seems to to some degree affect his hands. Coordination: Rapid alternating movements in the fingers/hands is tested and normal. Finger-to-nose maneuver tested and normal without evidence of ataxia, dysmetria or tremor.  Gait and station: Patient walks without assistive device and is able and assisted stool climb up to the exam table. Strength within normal limits.  Stance is stable and normal. Tandem gait is fragmented. Romberg testing is positive. Deep tendon reflexes: in the  upper and lower extremities are symmetric and intact. Babinski maneuver response is  downgoing.   Assessment:  Rituximab has reduced the patient's arthritic symptoms and has certainly beneficial effects on ankylosing spondylitis, the MG [place] may well be associated with these autoimmune disorders at baseline. His neuropathy also did not progress during the rituximab treatment. His last treatment was given in April and he is now on a " drug holiday".  He continues to be monitored by his hematologist-oncologist Dr. Cephas Darby. I think he can safely undergo an upper steroid injection to achieve some symptom relief. But the ankylosing spondylitis and markers neuropathy will likely progress if he is not treated with a specific immune modulation.  As to his obstructive sleep apnea, there is no need for any adjustment at this time. His current settings are comfortable and has residual AHI is very low. I would like to follow the patient once a year for the CPAP exquisitely. I refilled to date Neurontin for his neuropathic symptoms. A revisit will be scheduled  in 6 months.   Plan:  Treatment plan and  additional workup :

## 2013-08-09 DIAGNOSIS — L821 Other seborrheic keratosis: Secondary | ICD-10-CM | POA: Diagnosis not present

## 2013-08-09 DIAGNOSIS — L819 Disorder of pigmentation, unspecified: Secondary | ICD-10-CM | POA: Diagnosis not present

## 2013-08-09 DIAGNOSIS — Z85828 Personal history of other malignant neoplasm of skin: Secondary | ICD-10-CM | POA: Diagnosis not present

## 2013-08-09 DIAGNOSIS — D1801 Hemangioma of skin and subcutaneous tissue: Secondary | ICD-10-CM | POA: Diagnosis not present

## 2013-08-09 DIAGNOSIS — D239 Other benign neoplasm of skin, unspecified: Secondary | ICD-10-CM | POA: Diagnosis not present

## 2013-09-03 ENCOUNTER — Other Ambulatory Visit: Payer: Self-pay

## 2013-09-03 MED ORDER — TRIAMTERENE-HCTZ 37.5-25 MG PO TABS: 1.0000 | ORAL_TABLET | Freq: Every day | ORAL | Status: DC

## 2013-09-21 ENCOUNTER — Telehealth: Payer: Self-pay | Admitting: *Deleted

## 2013-09-21 NOTE — Telephone Encounter (Signed)
Pt called states he was not able to schedule a CPE until 12.18.14.  Pt is requesting a 90 day refill on medications.  Pt aware Md out of office til Monday.  Please advise

## 2013-09-21 NOTE — Telephone Encounter (Signed)
Ok for med refills 3 months supply.

## 2013-09-25 ENCOUNTER — Ambulatory Visit (INDEPENDENT_AMBULATORY_CARE_PROVIDER_SITE_OTHER): Payer: Medicare Other

## 2013-09-25 DIAGNOSIS — Z23 Encounter for immunization: Secondary | ICD-10-CM

## 2013-09-25 MED ORDER — GABAPENTIN 800 MG PO TABS: ORAL_TABLET | ORAL | Status: DC

## 2013-09-25 MED ORDER — CELECOXIB 200 MG PO CAPS: ORAL_CAPSULE | ORAL | Status: DC

## 2013-09-25 MED ORDER — AMLODIPINE BESYLATE 10 MG PO TABS: ORAL_TABLET | ORAL | Status: DC

## 2013-10-01 ENCOUNTER — Telehealth: Payer: Self-pay | Admitting: Oncology

## 2013-10-01 ENCOUNTER — Ambulatory Visit (HOSPITAL_BASED_OUTPATIENT_CLINIC_OR_DEPARTMENT_OTHER): Payer: Medicare Other | Admitting: Oncology

## 2013-10-01 ENCOUNTER — Other Ambulatory Visit: Payer: Self-pay | Admitting: Oncology

## 2013-10-01 ENCOUNTER — Other Ambulatory Visit (HOSPITAL_BASED_OUTPATIENT_CLINIC_OR_DEPARTMENT_OTHER): Payer: Medicare Other | Admitting: Lab

## 2013-10-01 VITALS — BP 128/72 | HR 53 | Temp 97.5°F | Resp 18 | Ht 67.0 in | Wt 188.1 lb

## 2013-10-01 DIAGNOSIS — G5790 Unspecified mononeuropathy of unspecified lower limb: Secondary | ICD-10-CM | POA: Diagnosis not present

## 2013-10-01 DIAGNOSIS — G619 Inflammatory polyneuropathy, unspecified: Secondary | ICD-10-CM

## 2013-10-01 DIAGNOSIS — I10 Essential (primary) hypertension: Secondary | ICD-10-CM | POA: Diagnosis not present

## 2013-10-01 DIAGNOSIS — G6181 Chronic inflammatory demyelinating polyneuritis: Secondary | ICD-10-CM

## 2013-10-01 DIAGNOSIS — G63 Polyneuropathy in diseases classified elsewhere: Secondary | ICD-10-CM | POA: Diagnosis not present

## 2013-10-01 DIAGNOSIS — D472 Monoclonal gammopathy: Secondary | ICD-10-CM

## 2013-10-01 DIAGNOSIS — D869 Sarcoidosis, unspecified: Secondary | ICD-10-CM | POA: Diagnosis not present

## 2013-10-01 DIAGNOSIS — G622 Polyneuropathy due to other toxic agents: Secondary | ICD-10-CM | POA: Diagnosis not present

## 2013-10-01 DIAGNOSIS — G4730 Sleep apnea, unspecified: Secondary | ICD-10-CM | POA: Diagnosis not present

## 2013-10-01 DIAGNOSIS — M359 Systemic involvement of connective tissue, unspecified: Secondary | ICD-10-CM | POA: Diagnosis not present

## 2013-10-01 DIAGNOSIS — C186 Malignant neoplasm of descending colon: Secondary | ICD-10-CM

## 2013-10-01 LAB — CBC WITH DIFFERENTIAL/PLATELET
BASO%: 0.6 % (ref 0.0–2.0)
Basophils Absolute: 0 10*3/uL (ref 0.0–0.1)
EOS%: 3.9 % (ref 0.0–7.0)
Eosinophils Absolute: 0.2 10*3/uL (ref 0.0–0.5)
HCT: 39.4 % (ref 38.4–49.9)
HGB: 13.7 g/dL (ref 13.0–17.1)
LYMPH%: 17.6 % (ref 14.0–49.0)
MCH: 31.3 pg (ref 27.2–33.4)
MCHC: 34.7 g/dL (ref 32.0–36.0)
MCV: 90.3 fL (ref 79.3–98.0)
MONO#: 0.5 10*3/uL (ref 0.1–0.9)
MONO%: 12.8 % (ref 0.0–14.0)
NEUT#: 2.7 10*3/uL (ref 1.5–6.5)
NEUT%: 65.1 % (ref 39.0–75.0)
Platelets: 182 10*3/uL (ref 140–400)
RBC: 4.36 10*6/uL (ref 4.20–5.82)
RDW: 13.1 % (ref 11.0–14.6)
WBC: 4.2 10*3/uL (ref 4.0–10.3)
lymph#: 0.7 10*3/uL — ABNORMAL LOW (ref 0.9–3.3)

## 2013-10-01 LAB — COMPREHENSIVE METABOLIC PANEL (CC13)
ALT: 16 U/L (ref 0–55)
AST: 19 U/L (ref 5–34)
Albumin: 4 g/dL (ref 3.5–5.0)
Alkaline Phosphatase: 53 U/L (ref 40–150)
BUN: 13 mg/dL (ref 7.0–26.0)
CO2: 30 mEq/L — ABNORMAL HIGH (ref 22–29)
Calcium: 10.5 mg/dL — ABNORMAL HIGH (ref 8.4–10.4)
Chloride: 104 mEq/L (ref 98–109)
Creatinine: 0.9 mg/dL (ref 0.7–1.3)
Glucose: 94 mg/dl (ref 70–140)
Potassium: 4 mEq/L (ref 3.5–5.1)
Sodium: 141 mEq/L (ref 136–145)
Total Bilirubin: 0.66 mg/dL (ref 0.20–1.20)
Total Protein: 7 g/dL (ref 6.4–8.3)

## 2013-10-01 NOTE — Telephone Encounter (Signed)
gv and printed appt sched and avs for pt for Feb 2015 °

## 2013-10-02 ENCOUNTER — Ambulatory Visit (HOSPITAL_COMMUNITY): Payer: Medicare Other | Attending: Cardiovascular Disease

## 2013-10-02 DIAGNOSIS — I6529 Occlusion and stenosis of unspecified carotid artery: Secondary | ICD-10-CM | POA: Diagnosis not present

## 2013-10-02 DIAGNOSIS — I10 Essential (primary) hypertension: Secondary | ICD-10-CM | POA: Diagnosis not present

## 2013-10-02 DIAGNOSIS — I658 Occlusion and stenosis of other precerebral arteries: Secondary | ICD-10-CM | POA: Diagnosis not present

## 2013-10-02 DIAGNOSIS — I739 Peripheral vascular disease, unspecified: Secondary | ICD-10-CM | POA: Insufficient documentation

## 2013-10-02 DIAGNOSIS — E785 Hyperlipidemia, unspecified: Secondary | ICD-10-CM | POA: Diagnosis not present

## 2013-10-02 DIAGNOSIS — Z951 Presence of aortocoronary bypass graft: Secondary | ICD-10-CM | POA: Insufficient documentation

## 2013-10-02 NOTE — Progress Notes (Signed)
Hematology and Oncology Follow Up Visit  Derrick [name] [id number] 1940/12/28 72 y.o. 10/02/2013 9:46 AM   Principle Diagnosis: Encounter Diagnoses  Name Primary?  . PULMONARY SARCOIDOSIS   . Monoclonal gammopathy of undetermined significance Yes  . UNSPECIFIED INFLAMMATORY AND TOXIC NEUROPATHY   . CIDP (chronic inflammatory demyelinating polyneuropathy)   . 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 He was initially evaluated and treated by a hematologist and a neurologist in [place] before he moved to [place]. 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 Please see my previous progress notes for full details.  His neurologic condition is characterized primarily by painful distal neuropathy, feet worse than hands, and lower extremity weakness. At one point he could barely walk a block without having to stop due to pain and weakness. He was given an initial trial of Rituxan in 2010 with no significant improvement. However following reinitiation of Rituxan in April 2012 he did get a progressive improvement. He was put on a maintenance program which we have continued in this office on an every other month basis through 03/23/2013.   His endurance has definitely improved. He is now walking 30 minutes a day without having to stop. Although he still has painful dysesthesias of his feet, symptoms have stabilized and not progressed. He starts to get pain  when he is standing for any prolonged periods of time. He remains unsteady on his feet due to the neuropathy and inability to feel his feet on the ground. He has had a number of falls. He has had increasing difficulty with fine movements of his fingers like buttoning his shirt  His respiratory  symptoms have significantly improved over time coincident with Rituxan treatment.  He does not feel that his condition has deteriorated since his last Rituxan treatment in March. He has had no interim medical problems. He had a recent followup with his neurologist on August 13.  He has spondylolisthesis with a L5-S1 radiculopathy. He was told that his symptom complex was consistent with ankylosing spondylitis.   Medications: reviewed  Allergies: No Known Allergies  Review of Systems: Constitutional:   No anorexia, fever, weight loss HEENT: No sore throat Respiratory: no cough or dyspnea. Cardiovascular:  No chest pain or palpitations Gastrointestinal: No abdominal pain or change in bowel habit Genito-Urinary: No urinary tract symptoms Musculoskeletal: See above Neurologic: See above Skin: no rash  Remaining ROS negative.  Physical Exam: Blood pressure 128/72, pulse 53, temperature 97.5 F (36.4 C), temperature source Oral, resp. rate 18, height 5\' 7"  (1.702 m), weight 188 lb 1.6 oz (85.322 kg), SpO2 100.00%. Wt Readings from Last 3 Encounters:  10/01/13 188 lb 1.6 oz (85.322 kg)  08/08/13 188 lb (85.276 kg)  08/08/13 187 lb (84.823 kg)     General appearance: Well-nourished Caucasian man HENNT: Pharynx no erythema or exudate. No thyromegaly Lymph nodes: No cervical, supraclavicular, or axillary adenopathy Breasts: Lungs: Clear to auscultation resonant to percussion Heart: Regular rhythm; 2/6 aortic systolic murmur, no  gallop Abdomen: Soft, nontender, no mass, no organomegaly Extremities: No edema, no calf tenderness Musculoskeletal: No joint deformities GU: Vascular: No carotid bruits, no cyanosis Neurologic: He is alert and oriented, PERRLA, motor strength 5 over 5, moderate to severe decrease in vibration sensation over the  fingertips by tuning fork exam Skin: No rash or ecchymosis.  Lab Results: CBC W/Diff    Component Value Date/Time   WBC 4.2 10/01/2013 1109    WBC 6.1 06/22/2011 1555   RBC 4.36 10/01/2013 1109   RBC 4.53 06/22/2011 1555   HGB 13.7 10/01/2013 1109   HGB 14.5 06/22/2011 1555   HCT 39.4 10/01/2013 1109   HCT 41.8 06/22/2011 1555   PLT 182 10/01/2013 1109   PLT 205.0 06/22/2011 1555   MCV 90.3 10/01/2013 1109   MCV 92.2 06/22/2011 1555   MCH 31.3 10/01/2013 1109   MCH 31.2 06/05/2011 0350   MCHC 34.7 10/01/2013 1109   MCHC 34.6 06/22/2011 1555   RDW 13.1 10/01/2013 1109   RDW 13.0 06/22/2011 1555   LYMPHSABS 0.7* 10/01/2013 1109   LYMPHSABS 0.9 06/22/2011 1555   MONOABS 0.5 10/01/2013 1109   MONOABS 0.6 06/22/2011 1555   EOSABS 0.2 10/01/2013 1109   EOSABS 0.3 06/22/2011 1555   BASOSABS 0.0 10/01/2013 1109   BASOSABS 0.0 06/22/2011 1555     Chemistry      Component Value Date/Time   NA 141 10/01/2013 1109   NA 141 08/01/2012 1118   K 4.0 10/01/2013 1109   K 3.7 08/01/2012 1118   CL 104 03/23/2013 0855   CL 102 08/01/2012 1118   CO2 30* 10/01/2013 1109   CO2 31 08/01/2012 1118   BUN 13.0 10/01/2013 1109   BUN 15 08/01/2012 1118   CREATININE 0.9 10/01/2013 1109   CREATININE 0.81 08/01/2012 1118   CREATININE 0.87 07/30/2011 0733      Component Value Date/Time   CALCIUM 10.5* 10/01/2013 1109   CALCIUM 10.6* 08/01/2012 1118   ALKPHOS 53 10/01/2013 1109   ALKPHOS 51 07/02/2013 0926   AST 19 10/01/2013 1109   AST 22 07/02/2013 0926   ALT 16 10/01/2013 1109   ALT 18 07/02/2013 0926   BILITOT 0.66 10/01/2013 1109   BILITOT 0.7 07/02/2013 0926       Impression: #1. Pulmonary Sarcoidosis biopsy proven  #2. 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 I will monitor him clinically. He did get a partial response/stabilization with Rituxan I believe we could use this again in the future if necessary. His IgM levels have been very stable and only mildly elevated. He  #3. Sleep apnea syndrome secondary to #1  #4. Coronary artery disease status post MI status post right coronary drug  eluting stent  #5. Essential hypertension  #6. BPH  #7. Adenomatous polyps of the colon  #8 glaucoma  #9 question ankylosing spondylitis  Plan:   CC:[name]    [name], MD 10/7/20149:46 AM

## 2013-10-03 DIAGNOSIS — R972 Elevated prostate specific antigen [PSA]: Secondary | ICD-10-CM | POA: Diagnosis not present

## 2013-10-03 LAB — IMMUNOFIXATION ELECTROPHORESIS
IgA: 126 mg/dL (ref 68–379)
IgG (Immunoglobin G), Serum: 766 mg/dL (ref 650–1600)
IgM, Serum: 248 mg/dL (ref 41–251)
Total Protein, Serum Electrophoresis: 6.8 g/dL (ref 6.0–8.3)

## 2013-10-03 LAB — PSA: PSA: 2.96

## 2013-10-04 ENCOUNTER — Telehealth: Payer: Self-pay | Admitting: *Deleted

## 2013-10-04 NOTE — Telephone Encounter (Signed)
Message copied by Gala Romney on Thu Oct 04, 2013 11:31 AM ------      Message from: [name]      Created: Wed Oct 03, 2013  1:36 PM       Call pt: IgM now in high normal range @248  ------

## 2013-10-04 NOTE — Telephone Encounter (Signed)
Per Dr. Cyndie Chime, pt informed of IgM now in high normal range @248 .  Pt verbalized understanding of results.

## 2013-10-17 ENCOUNTER — Other Ambulatory Visit: Payer: Self-pay

## 2013-10-17 DIAGNOSIS — N401 Enlarged prostate with lower urinary tract symptoms: Secondary | ICD-10-CM | POA: Diagnosis not present

## 2013-10-17 DIAGNOSIS — R972 Elevated prostate specific antigen [PSA]: Secondary | ICD-10-CM | POA: Diagnosis not present

## 2013-10-17 DIAGNOSIS — N529 Male erectile dysfunction, unspecified: Secondary | ICD-10-CM | POA: Diagnosis not present

## 2013-10-17 MED ORDER — ATORVASTATIN CALCIUM 20 MG PO TABS: ORAL_TABLET | ORAL | Status: DC

## 2013-10-24 ENCOUNTER — Encounter: Payer: Self-pay | Admitting: Physician Assistant

## 2013-12-12 ENCOUNTER — Telehealth: Payer: Self-pay | Admitting: Oncology

## 2013-12-12 NOTE — Telephone Encounter (Signed)
s.w. pt and r/s appts per pt request....done and pt aware

## 2013-12-13 ENCOUNTER — Other Ambulatory Visit (INDEPENDENT_AMBULATORY_CARE_PROVIDER_SITE_OTHER): Payer: Medicare Other

## 2013-12-13 ENCOUNTER — Ambulatory Visit (INDEPENDENT_AMBULATORY_CARE_PROVIDER_SITE_OTHER): Payer: Medicare Other | Admitting: Internal Medicine

## 2013-12-13 ENCOUNTER — Encounter: Payer: Self-pay | Admitting: Internal Medicine

## 2013-12-13 VITALS — BP 108/68 | HR 47 | Temp 97.0°F | Ht 70.0 in | Wt 182.0 lb

## 2013-12-13 DIAGNOSIS — Z0000 Encounter for general adult medical examination without abnormal findings: Secondary | ICD-10-CM

## 2013-12-13 DIAGNOSIS — Z23 Encounter for immunization: Secondary | ICD-10-CM

## 2013-12-13 DIAGNOSIS — Z86010 Personal history of colonic polyps: Secondary | ICD-10-CM | POA: Diagnosis not present

## 2013-12-13 DIAGNOSIS — G622 Polyneuropathy due to other toxic agents: Secondary | ICD-10-CM | POA: Diagnosis not present

## 2013-12-13 DIAGNOSIS — I059 Rheumatic mitral valve disease, unspecified: Secondary | ICD-10-CM

## 2013-12-13 DIAGNOSIS — I2510 Atherosclerotic heart disease of native coronary artery without angina pectoris: Secondary | ICD-10-CM | POA: Diagnosis not present

## 2013-12-13 DIAGNOSIS — J45909 Unspecified asthma, uncomplicated: Secondary | ICD-10-CM

## 2013-12-13 DIAGNOSIS — E785 Hyperlipidemia, unspecified: Secondary | ICD-10-CM

## 2013-12-13 DIAGNOSIS — G619 Inflammatory polyneuropathy, unspecified: Secondary | ICD-10-CM

## 2013-12-13 DIAGNOSIS — R0989 Other specified symptoms and signs involving the circulatory and respiratory systems: Secondary | ICD-10-CM

## 2013-12-13 DIAGNOSIS — C186 Malignant neoplasm of descending colon: Secondary | ICD-10-CM

## 2013-12-13 DIAGNOSIS — E559 Vitamin D deficiency, unspecified: Secondary | ICD-10-CM

## 2013-12-13 DIAGNOSIS — G63 Polyneuropathy in diseases classified elsewhere: Secondary | ICD-10-CM

## 2013-12-13 DIAGNOSIS — D472 Monoclonal gammopathy: Secondary | ICD-10-CM

## 2013-12-13 DIAGNOSIS — G4733 Obstructive sleep apnea (adult) (pediatric): Secondary | ICD-10-CM

## 2013-12-13 DIAGNOSIS — K219 Gastro-esophageal reflux disease without esophagitis: Secondary | ICD-10-CM

## 2013-12-13 DIAGNOSIS — C4491 Basal cell carcinoma of skin, unspecified: Secondary | ICD-10-CM

## 2013-12-13 LAB — T4, FREE: Free T4: 0.88 ng/dL (ref 0.60–1.60)

## 2013-12-13 LAB — TSH: TSH: 2.31 u[IU]/mL (ref 0.35–5.50)

## 2013-12-13 MED ORDER — GABAPENTIN 800 MG PO TABS: ORAL_TABLET | ORAL | Status: DC

## 2013-12-13 MED ORDER — TRIAMTERENE-HCTZ 37.5-25 MG PO TABS: 1.0000 | ORAL_TABLET | Freq: Every day | ORAL | Status: DC

## 2013-12-13 NOTE — Progress Notes (Signed)
Pre visit review using our clinic review tool, if applicable. No additional management support is needed unless otherwise documented below in the visit note. 

## 2013-12-13 NOTE — Patient Instructions (Signed)
Thanks for coming in to see me and happy holidays  You have an innocent murmur that is consistent with mild aortic valve calcification - doesn't require any follow up.  Your exam is otherwise normal.  Will check thyroid function and B12 in regard to reset thermostat and neuropathy. Go ahead an increase gabapentin to 4 times a day.  Will check cholesterol panel today.  Immunizations - up to date and will give Prevnar today.  A referral has been placed to see Dr. Claudette Head for gastroenterology. If you don't hear back in 1 week please let me know.  Over all: you are in pretty good condition for the condition you are in.

## 2013-12-13 NOTE — Progress Notes (Signed)
Subjective:    Patient ID: Derrick [name], male    DOB: 1941-07-12, 72 y.o.   MRN: [id number]  HPI The patient is here for annual Medicare wellness examination and management of other chronic and acute problems.  He reports that he is doing well. He has a click and lock of the right knee. He has a history of torn meniscus lateral and medial. He did see Dr. Cyndie Chime in October - IgM in normal range. He has finished a two year round of Rituxin and he has a peripheral neuropathy as a consequence. He does take gabapentin q 4 hrs 3 times a day.   The risk factors are reflected in the social history.  The roster of all physicians providing medical care to patient - is listed in the Snapshot section of the chart.  Activities of daily living:  The patient is 100% inedpendent in all ADLs: dressing, toileting, feeding as well as independent mobility  Home safety : The patient has smoke detectors in the home. Falls - couple of fall due to balance issues related to peripheral neuropathy. Home is fall safe.  They wear seatbelts.  firearms are present in the home, kept in a safe fashion. There is no violence in the home.   There is no risks for hepatitis, STDs or HIV. There is a history of blood transfusion- remotely with colon surgery. They have no travel history to infectious disease endemic areas of the world.  The patient has seen their dentist in the last six month. They have seen their eye doctor in the last year. They deny any hearing difficulty and have not had audiologic testing in the last year.    They do not  have excessive sun exposure. Discussed the need for sun protection: hats, long sleeves and use of sunscreen if there is significant sun exposure.   Diet: the importance of a healthy diet is discussed. They do have a healthy diet.  The patient has a regular exercise program: walks , 40 min duration, 7 per week.  The benefits of regular aerobic exercise were discussed.  Depression  screen: there are no signs or vegative symptoms of depression- irritability, change in appetite, anhedonia, sadness/tearfullness.  Cognitive assessment: the patient manages all their financial and personal affairs and is actively engaged.  The following portions of the patient's history were reviewed and updated as appropriate: allergies, current medications, past family history, past medical history,  past surgical history, past social history  and problem list.  Vision, hearing, body mass index were assessed and reviewed.   During the course of the visit the patient was educated and counseled about appropriate screening and preventive services including : fall prevention , diabetes screening, nutrition counseling, colorectal cancer screening, and recommended immunizations.  Derrick [name] Current Outpatient Prescriptions on File Prior to Visit  Medication Sig Dispense Refill  . albuterol (PROVENTIL HFA) 108 (90 BASE) MCG/ACT inhaler Inhale 2 puffs into the lungs every 6 (six) hours as needed.        . ALPHAGAN P 0.1 % SOLN Place 1 drop into both eyes every 12 (twelve) hours.       [name] amLODipine (NORVASC) 10 MG tablet TAKE ONE TABLET BY MOUTH EVERY DAY  90 tablet  0  . aspirin 81 MG tablet Take 81 mg by mouth daily.        [name] atorvastatin (LIPITOR) 20 MG tablet TAKE ONE TABLET BY MOUTH EVERY DAY  90 tablet  2  . budesonide (PULMICORT) 180 MCG/ACT  inhaler Inhale 2 puffs into the lungs 2 (two) times daily.  3 each  3  . celecoxib (CELEBREX) 200 MG capsule TAKE ONE CAPSULE BY MOUTH ONCE DAILY  90 capsule  0  . CHOLECALCIFEROL PO Take 1,000 mg by mouth daily. Vitamin D 3      . clopidogrel (PLAVIX) 75 MG tablet TAKE ONE TABLET BY MOUTH EVERY DAY  90 tablet  3  . finasteride (PROSCAR) 5 MG tablet Take 5 mg by mouth daily.       . fluticasone (FLONASE) 50 MCG/ACT nasal spray USE TWO SPRAYS IN EACH NOSTRIL TWICE DAILY  48 g  1  . Multiple Vitamin (MULTIVITAMIN) tablet Take 1 tablet by mouth daily.        .  nitroGLYCERIN (NITROSTAT) 0.4 MG SL tablet Place 0.4 mg under the tongue every 5 (five) minutes as needed. Pt advised not to take this while on celebrex      . pantoprazole (PROTONIX) 40 MG tablet Take 1 tablet (40 mg total) by mouth daily.  90 tablet  3  . tadalafil (CIALIS) 5 MG tablet Take 5 mg by mouth daily.       No current facility-administered medications on file prior to visit.     Review of Systems Constitutional:  Negative for fever, chills, activity change and unexpected weight change.  HEENT:  Negative for hearing loss, ear pain, congestion, neck stiffness and postnasal drip. Negative for sore throat or swallowing problems. Negative for dental complaints.   Eyes: Negative for vision loss or change in visual acuity.  Respiratory: Negative for chest tightness and wheezing. Negative for DOE.   Cardiovascular: Negative for chest pain. Positive palpitations - usually at night. No decreased exercise tolerance Gastrointestinal: No change in bowel habit. No bloating or gas. No reflux or indigestion Genitourinary: Negative for urgency, frequency, flank pain and difficulty urinating.  Musculoskeletal: Negative for myalgias, back pain, arthralgias and gait problem.  Neurological: Negative for dizziness, tremors, weakness and headaches.  Hematological: Negative for adenopathy.  Psychiatric/Behavioral: Negative for behavioral problems and dysphoric mood.       Objective:   Physical Exam Filed Vitals:   12/13/13 0856  BP: 108/68  Pulse: 47  Temp: 97 F (36.1 C)   Wt Readings from Last 3 Encounters:  12/13/13 182 lb (82.555 kg)  10/01/13 188 lb 1.6 oz (85.322 kg)  08/08/13 188 lb (85.276 kg)   Gen'l: Well nourished well developed male in no acute distress  HEENT: Head: Normocephalic and atraumatic. Right Ear: External ear normal. EAC/TM nl. Left Ear: External ear normal.  EAC- occluded with cerumen/TM nl. Nose: Nose normal. Mouth/Throat: Oropharynx is clear and moist. Dentition -  native, in good repair. No buccal or palatal lesions. Posterior pharynx clear. Eyes: Conjunctivae and sclera clear. EOM intact. Pupils are equal, round, and reactive to light. Right eye exhibits no discharge. Left eye exhibits no discharge. Neck: Normal range of motion. Neck supple. No JVD present. No tracheal deviation present. No thyromegaly present.  Cardiovascular: Normal rate, regular rhythm, no gallop, no friction rub, II/VI innocent murmur heard at RSB.      Quiet precordium. 2+ radial and DP pulses . No carotid bruits Pulmonary/Chest: Effort normal. No respiratory distress or increased WOB, no wheezes, no rales. No chest wall deformity or CVAT. Abdomen: Soft. Bowel sounds are normal in all quadrants. He exhibits no distension, no tenderness, no rebound or guarding, No heptosplenomegaly  Genitourinary:  deferred to Dr. Laverle Patter Musculoskeletal: Normal range of motion. He exhibits  no edema and no tenderness.       Small and large joints without redness, synovial thickening or deformity. Full range of motion preserved about all small, median and large joints. Did not appreciate click at right knee Lymphadenopathy:    He has no cervical or supraclavicular adenopathy.  Neurological: He is alert and oriented to person, place, and time. CN II-XII intact. DTRs 2+ and symmetrical biceps, 0 at patellar tendons. Cerebellar function normal with no tremor, rigidity, normal gait and station.  Skin: Skin is warm and dry. No rash noted. No erythema.  Psychiatric: He has a normal mood and affect. His behavior is normal. Thought content normal.   Recent Results (from the past 2160 hour(s))  CBC WITH DIFFERENTIAL     Status: Abnormal   Collection Time    10/01/13 11:09 AM      Result Value Range   WBC 4.2  4.0 - 10.3 10e3/uL   NEUT# 2.7  1.5 - 6.5 10e3/uL   HGB 13.7  13.0 - 17.1 g/dL   HCT 16.1  09.6 - 04.5 %   Platelets 182  140 - 400 10e3/uL   MCV 90.3  79.3 - 98.0 fL   MCH 31.3  27.2 - 33.4 pg    MCHC 34.7  32.0 - 36.0 g/dL   RBC 4.09  8.11 - 9.14 10e6/uL   RDW 13.1  11.0 - 14.6 %   lymph# 0.7 (*) 0.9 - 3.3 10e3/uL   MONO# 0.5  0.1 - 0.9 10e3/uL   Eosinophils Absolute 0.2  0.0 - 0.5 10e3/uL   Basophils Absolute 0.0  0.0 - 0.1 10e3/uL   NEUT% 65.1  39.0 - 75.0 %   LYMPH% 17.6  14.0 - 49.0 %   MONO% 12.8  0.0 - 14.0 %   EOS% 3.9  0.0 - 7.0 %   BASO% 0.6  0.0 - 2.0 %  COMPREHENSIVE METABOLIC PANEL (CC13)     Status: Abnormal   Collection Time    10/01/13 11:09 AM      Result Value Range   Sodium 141  136 - 145 mEq/L   Potassium 4.0  3.5 - 5.1 mEq/L   Chloride 104  98 - 109 mEq/L   CO2 30 (*) 22 - 29 mEq/L   Glucose 94  70 - 140 mg/dl   BUN 78.2  7.0 - 95.6 mg/dL   Creatinine 0.9  0.7 - 1.3 mg/dL   Total Bilirubin 2.13  0.20 - 1.20 mg/dL   Alkaline Phosphatase 53  40 - 150 U/L   AST 19  5 - 34 U/L   ALT 16  0 - 55 U/L   Total Protein 7.0  6.4 - 8.3 g/dL   Albumin 4.0  3.5 - 5.0 g/dL   Calcium 08.6 (*) 8.4 - 10.4 mg/dL  IMMUNOFIXATION ELECTROPHORESIS     Status: None   Collection Time    10/01/13  1:13 PM      Result Value Range   Total Protein, Serum Electrophoresis 6.8  6.0 - 8.3 g/dL   IgG (Immunoglobin G), Serum 766  650 - 1600 mg/dL   IgA 578  68 - 469 mg/dL   IgM, Serum 629  41 - 251 mg/dL   Immunofix Electr Int *     Comment: Monoclonal IgM lambda protein is present.Reviewed by Dallas Breeding, MD, PhD, FCAP (Electronic Signature onFile)  T4, FREE     Status: None   Collection Time    12/13/13 10:07 AM  Result Value Range   Free T4 0.88  0.60 - 1.60 ng/dL  TSH     Status: None   Collection Time    12/13/13 10:07 AM      Result Value Range   TSH 2.31  0.35 - 5.50 uIU/mL         Assessment & Plan:

## 2013-12-16 DIAGNOSIS — Z0000 Encounter for general adult medical examination without abnormal findings: Secondary | ICD-10-CM | POA: Insufficient documentation

## 2013-12-16 NOTE — Assessment & Plan Note (Signed)
Face, neck, arms and chest w/o lesions.  Plan continued close follow up with dermatology

## 2013-12-16 NOTE — Assessment & Plan Note (Signed)
Does continue with CPAP. He has not seen pulmonary or had retitration study.

## 2013-12-16 NOTE — Assessment & Plan Note (Signed)
Interval history w/o major illness, hospitalization, surgery or injury. Limited physical exam notable for cerumen impaction left, II/VI cardiac mm best at RSB otherwise normal. Colonoscopy - no report in EPIC. He is due - will refer to Dr. Russella Dar. Immunizations - needs to return for pneumovax, tetanus. 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 Zostavax.  In summary A pleasant man who is medically stable.

## 2013-12-16 NOTE — Assessment & Plan Note (Signed)
No change in bowel habit reported.  Plan Refer for follow up colonoscopy

## 2013-12-16 NOTE — Assessment & Plan Note (Signed)
On exam there is a noticeable mm at RSB. Feint mm at apex. No 2D echo in EPIC  Plan 2D echo

## 2013-12-16 NOTE — Assessment & Plan Note (Signed)
Continues with Dr. Cyndie Chime. Studies have been normal - he is back to normal for the first time in many years. He did develop peripheral neuropathy related to treatment.  Plan Per Dr. Reece Agar.

## 2013-12-16 NOTE — Assessment & Plan Note (Signed)
takng and tolerating "statin" therapy. Lab reveals great control with LDL 76, HDL 54. LFTs normal  Plan Continue present regimen.

## 2013-12-16 NOTE — Assessment & Plan Note (Signed)
Stable on chronic PPI therapy

## 2013-12-16 NOTE — Assessment & Plan Note (Signed)
No complaints of flares. Using rescue medication, albuterol, rarely. Uses maintenance medication, pulmicort, routinely.

## 2013-12-16 NOTE — Assessment & Plan Note (Signed)
Discussed his neuropathy.  Plan Increase gabapentin to qid.

## 2013-12-16 NOTE — Assessment & Plan Note (Signed)
Asymptomatic carotid bruits vs radiation of aortic mm with no clinically significant occlusion.  Plan biannual follow up

## 2013-12-16 NOTE — Assessment & Plan Note (Signed)
No report of change in bowel habit.  Plan Refer to GI for follow-up

## 2013-12-26 DIAGNOSIS — H409 Unspecified glaucoma: Secondary | ICD-10-CM | POA: Diagnosis not present

## 2013-12-26 DIAGNOSIS — H4011X0 Primary open-angle glaucoma, stage unspecified: Secondary | ICD-10-CM | POA: Diagnosis not present

## 2013-12-28 ENCOUNTER — Other Ambulatory Visit: Payer: Self-pay | Admitting: Internal Medicine

## 2013-12-31 ENCOUNTER — Other Ambulatory Visit (INDEPENDENT_AMBULATORY_CARE_PROVIDER_SITE_OTHER): Payer: Medicare Other

## 2013-12-31 ENCOUNTER — Other Ambulatory Visit: Payer: Self-pay | Admitting: *Deleted

## 2013-12-31 DIAGNOSIS — E785 Hyperlipidemia, unspecified: Secondary | ICD-10-CM | POA: Diagnosis not present

## 2013-12-31 DIAGNOSIS — I2510 Atherosclerotic heart disease of native coronary artery without angina pectoris: Secondary | ICD-10-CM

## 2013-12-31 LAB — BASIC METABOLIC PANEL
BUN: 19 mg/dL (ref 6–23)
CO2: 33 mEq/L — ABNORMAL HIGH (ref 19–32)
Calcium: 10.1 mg/dL (ref 8.4–10.5)
Chloride: 102 mEq/L (ref 96–112)
Creatinine, Ser: 1 mg/dL (ref 0.4–1.5)
GFR: 79.26 mL/min (ref >60.00)
Glucose, Bld: 76 mg/dL (ref 70–99)
Potassium: 4 mEq/L (ref 3.5–5.1)
Sodium: 140 mEq/L (ref 135–145)

## 2013-12-31 LAB — CBC WITH DIFFERENTIAL/PLATELET
Basophils Absolute: 0 10*3/uL (ref 0.0–0.1)
Basophils Relative: 0.4 % (ref 0.0–3.0)
Eosinophils Absolute: 0.2 10*3/uL (ref 0.0–0.7)
Eosinophils Relative: 4.3 % (ref 0.0–5.0)
HCT: 41.2 % (ref 39.0–52.0)
Hemoglobin: 13.9 g/dL (ref 13.0–17.0)
Lymphocytes Relative: 18 % (ref 12.0–46.0)
Lymphs Abs: 0.9 10*3/uL (ref 0.7–4.0)
MCHC: 33.9 g/dL (ref 30.0–36.0)
MCV: 91.4 fl (ref 78.0–100.0)
Monocytes Absolute: 0.6 10*3/uL (ref 0.1–1.0)
Monocytes Relative: 11.4 % (ref 3.0–12.0)
Neutro Abs: 3.2 10*3/uL (ref 1.4–7.7)
Neutrophils Relative %: 65.9 % (ref 43.0–77.0)
Platelets: 191 10*3/uL (ref 150.0–400.0)
RBC: 4.51 Mil/uL (ref 4.22–5.81)
RDW: 13.3 % (ref 11.5–14.6)
WBC: 4.9 10*3/uL (ref 4.5–10.5)

## 2013-12-31 LAB — LIPID PANEL
Cholesterol: 154 mg/dL (ref 0–200)
HDL: 56.9 mg/dL (ref >39.00)
LDL Cholesterol: 87 mg/dL (ref 0–99)
Total CHOL/HDL Ratio: 3
Triglycerides: 52 mg/dL (ref 0.0–149.0)
VLDL: 10.4 mg/dL (ref 0.0–40.0)

## 2013-12-31 LAB — HEPATIC FUNCTION PANEL
ALT: 20 U/L (ref 0–53)
AST: 22 U/L (ref 0–37)
Albumin: 4.7 g/dL (ref 3.5–5.2)
Alkaline Phosphatase: 43 U/L (ref 39–117)
Bilirubin, Direct: 0.1 mg/dL (ref 0.0–0.3)
Total Bilirubin: 0.8 mg/dL (ref 0.3–1.2)
Total Protein: 7.1 g/dL (ref 6.0–8.3)

## 2014-01-16 ENCOUNTER — Encounter: Payer: Self-pay | Admitting: Internal Medicine

## 2014-01-22 ENCOUNTER — Ambulatory Visit: Payer: Medicare Other | Admitting: Cardiovascular Disease

## 2014-01-28 ENCOUNTER — Encounter (HOSPITAL_COMMUNITY): Payer: Medicare Other | Attending: Cardiovascular Disease | Admitting: Radiology

## 2014-01-28 VITALS — BP 139/71 | HR 53

## 2014-01-28 DIAGNOSIS — I2510 Atherosclerotic heart disease of native coronary artery without angina pectoris: Secondary | ICD-10-CM | POA: Insufficient documentation

## 2014-01-28 DIAGNOSIS — R079 Chest pain, unspecified: Secondary | ICD-10-CM | POA: Insufficient documentation

## 2014-01-28 MED ORDER — TECHNETIUM TC 99M SESTAMIBI GENERIC - CARDIOLITE
11.0000 | Freq: Once | INTRAVENOUS | Status: AC | PRN
  Administered 2014-01-28: 11 via INTRAVENOUS

## 2014-01-28 MED ORDER — TECHNETIUM TC 99M SESTAMIBI GENERIC - CARDIOLITE
33.0000 | Freq: Once | INTRAVENOUS | Status: AC | PRN
  Administered 2014-01-28: 33 via INTRAVENOUS

## 2014-01-28 NOTE — Progress Notes (Signed)
[facility] [address] [place], [place] [address] [phone]    Cardiology Nuclear Med Study  Flavius Repsher is a 73 y.o. male     MRN : [id number]     DOB: Apr 04, 1941  Procedure Date: 01/28/2014  Nuclear Med Background Indication for Stress Test:  Evaluation for Ischemia and Stent Patency History:  CAD, MI (2012), Stent (RCA 2012), Echo 2012 EF 55-60%, Asthma, pulmonary sarcoidosis Cardiac Risk Factors: Carotid Disease, Family History - CAD, Hypertension and Lipids  Symptoms:  Chest Pain   Nuclear Pre-Procedure Caffeine/Decaff Intake:  None NPO After: 7:00am   Lungs:  clear O2 Sat: 98% on room air. IV 0.9% NS with Angio Cath:  22g  IV Site: R Hand  IV Started by:  Matilde Haymaker, RN  Chest Size (in):  42 Cup Size: n/a  Height:    Weight:     BMI:  There is no weight on file to calculate BMI. Tech Comments:  n/a    Nuclear Med Study 1 or 2 day study: 1 day  Stress Test Type:  Stress  Reading MD: na  Order Authorizing Provider:  Legrand Como Cooper,MD  Resting Radionuclide: Technetium 8m Sestamibi  Resting Radionuclide Dose: 11.0 mCi   Stress Radionuclide:  Technetium 71m Sestamibi  Stress Radionuclide Dose: 33.0 mCi           Stress Protocol Rest HR: 53 Stress HR: 137  Rest BP: 139/71 Stress BP: 159/65  Exercise Time (min): 9:00 METS: 10.1           Dose of Adenosine (mg):  n/a Dose of Lexiscan: n/a mg  Dose of Atropine (mg): n/a Dose of Dobutamine: n/a mcg/kg/min (at max HR)  Stress Test Technologist: Glade Lloyd, BS-ES  Nuclear Technologist:  Charlton Amor, CNMT     Rest Procedure:  Myocardial perfusion imaging was performed at rest 45 minutes following the intravenous administration of Technetium 52m Sestamibi. Rest ECG: Sinus breadycardia  53 bpm.    Stress Procedure:  The patient exercised on the treadmill utilizing the Bruce Protocol for 9:00 minutes. The patient stopped due to lungs burning and denied any chest pain.   Technetium 43m Sestamibi was injected at peak exercise and myocardial perfusion imaging was performed after a brief delay. Stress ECG: Insignificant ST depression in the inferior and lateral leads.  Extensive motion during exercise    QPS Raw Data Images: Soft tissue (diaphragm, bowel activity) underlie heart.   Stress Images: Moderate defect  In the inferior wall (base, minimally mid, distal)  Inferolateral wall (base, distal) inferoseptal wall (base) and apex.  Otherwise normal perfusion.   Rest Images:  Gut activity overlies inferolateral wall.  Note imprved countsin in the mid/distal inferior,distal inferolateral wall.   Subtraction (SDS):  Not significant quantitatively.   Transient Ischemic Dilatation (Normal <1.22):  1.02 Lung/Heart Ratio (Normal <0.45):  0.33  Quantitative Gated Spect Images QGS EDV:  140 ml QGS ESV:  50 ml  Impression Exercise Capacity:  Excellent exercise capacity. BP Response:  BP dropped at peak exercise below baseline.  Recovered in recovery period.   Clinical Symptoms:  No chest pain. ECG Impression:  See above  Electrically negative.   Comparison with Prior Nuclear Study: No previous nuclear study performed  Overall Impression:  Inferior/inferolateral thinning consistent with soft tissue attenuation and a small region of ischemia in the distal inferolateral, distal inferior walls.  Bowel activity interferes with rest imaging, making interpretation difficult  LV Ejection Fraction: 64%.  LV  Wall Motion:  NL LV Function; NL Wall Motion  Derrick [name]

## 2014-02-04 ENCOUNTER — Other Ambulatory Visit: Payer: Medicare Other

## 2014-02-06 ENCOUNTER — Ambulatory Visit: Payer: Medicare Other | Admitting: Nurse Practitioner

## 2014-02-13 ENCOUNTER — Ambulatory Visit (INDEPENDENT_AMBULATORY_CARE_PROVIDER_SITE_OTHER): Payer: Medicare Other | Admitting: Neurology

## 2014-02-13 ENCOUNTER — Encounter: Payer: Self-pay | Admitting: Neurology

## 2014-02-13 VITALS — BP 128/72 | HR 56 | Ht 69.25 in | Wt 188.0 lb

## 2014-02-13 DIAGNOSIS — D869 Sarcoidosis, unspecified: Secondary | ICD-10-CM

## 2014-02-13 DIAGNOSIS — D472 Monoclonal gammopathy: Secondary | ICD-10-CM | POA: Diagnosis not present

## 2014-02-13 DIAGNOSIS — L744 Anhidrosis: Secondary | ICD-10-CM

## 2014-02-13 DIAGNOSIS — I2510 Atherosclerotic heart disease of native coronary artery without angina pectoris: Secondary | ICD-10-CM

## 2014-02-13 DIAGNOSIS — M48061 Spinal stenosis, lumbar region without neurogenic claudication: Secondary | ICD-10-CM | POA: Diagnosis not present

## 2014-02-13 MED ORDER — GABAPENTIN 800 MG PO TABS: ORAL_TABLET | ORAL | Status: DC

## 2014-02-13 NOTE — Patient Instructions (Signed)

## 2014-02-13 NOTE — Progress Notes (Signed)
[facility]  Provider:  Larey Seat, M D  Referring Provider: Neena Rhymes, MD Primary Care Physician:  Adella Hare, MD  Chief Complaint  Patient presents with  . Follow-up    Room 11  . Sleep Apnea   MGUS neuropathy, ankylsing spondylitis and OSA/ sarcoidosis.   HPI:  Derrick [name] is a 73 y.o. male  Is seen here as a referral/ revisit  from Dr. [name] . His PCP is  Dr . 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  He had been for at least 12 years CPAP treatment at a pressure of 16 cm water,  he was also using a full face mask at the time , which was comfortable.  His original diagnostic sleep study took place in 2000. The patient endorsed  Epworth sleepiness score at the of 24 points in the FSS at an elevated rate of 42 points as well. His BMI at the time was 30.  Meanwhile he had lost 65 pounds.   Derrick [name]  underwent a sleep study on 11-30-2011.His AHI at time of study was 42.1 and his RDI 45.5 his REM AHI was 30 and non-REM 42.9 he did not have any supine sleep. He had oxygen desaturations to a nadir of 82% and had a total desaturation time of 52.6 minutes with a the first 3 hours of sleep. The results were indicative of severe sleep apnea and  the patient was titrated to CPAP on 12-05-2011 at the pressure of only 8 cm. He had REM rebound at this setting ,the residual AHI in the night was 0.3 per hour.  He also recommended the use of a nasal pillow of possible. The  CPAP was download it in 2013 after 90 days of CPAP he was with a new pressure. He had 100% compliance rate and residual AHI of 3.4 with only moderate to mild air leak that pressure was 8 cm water. There are peak time per day was 8 hours and 48 minutes.  Mr. for his visit today  is less designed to check on the CPAP compliance and more on his neuropathy. The patient endorses fatigue severity scale today at 38 point Epworth sleepiness score at points and the geriatric depression score at one point. I was able to review his laboratory results and compare them to the last years. He seems to be a CO2 retainer  (this was not evidence that 7 months ago when I had  last seen  the patient )  He has a normal  Renal  filtration rate,  the electrolytes were in  normal range creatinine,  normal BUN , normal potassium and sodium are in normal range. There  is no hepatic dysfunction noted, his lipids are in normal range and a CBC with differential was entirely normal.  I have not seen a recent IgM study ( the monoclonal antibody class that was  abnormal ).  He reports some near falls, and fell backwards, hitting his head in November , he has crepitation in the right knee, tripped and fell over a bedstand. He is numb , lost propio-ception in his right foot , and has a mild foot drop.     Last visit:   CPAP still set at 8 cm  water  With  3 cm EPR.  The air leak has slightly increased the residual AHI is 2.2,  even better than last year. The  therapeutic time is 8 hour and 53 minutes.  The patient has 100% compliance. Today he endorsed Epworth sleepiness score at 0 point and the fatigue severity score at only 15 points.  Geriatric depression score was endorsed at he only scored 1 point not suggestive of clinical depression.  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  He did develop not neuropathic pain,  but some and knee pain on the right and an MRI was now showing DDD on the correspondin spinal level.  He has  completed his rituximab treatment with Dr. [name]. in February 2014 , he underwent a total of 3  steroid injections at the  L5-S1 distribution which had meanwhile shown a benefit.  He has been on gabapentin for neuropathic pain, seems to be related to his MGUS diagnosis .He had developed a severe cold intolerance progressively actually the last year.  He has noticed since March and since his Retuximab treatment was completed, that he has some cold cramping as well and toe cramps,  pain and numbness      Review of Systems: Out of a complete 14 system review, the patient complains of only the following symptoms, and all other reviewed systems are negative. The patient no longer has any residual daytime sleepiness, his sleep on CPAP has been restorative. He describes no nocturia.  He reports ongoing problems probably worsening problems with his hands " falling asleep" and  feeling tingly and numb.  This bothers her especially when operating machinery the at the steering wheel and his car or there the handle of his lawnmower.   History   Social History  . Marital Status: Married    Spouse Name: N/A    Number of Children: 2  . Years of Education: MS-Coll.   Occupational History  . retired     Building services engineer   Social History Main Topics  . Smoking status: Never Smoker   . Smokeless tobacco: Never Used  . Alcohol Use: No  . Drug Use: No  . Sexual Activity: Not on file   Other Topics Concern  . Not on file   Social History Narrative   College- [employer]; [employer]-environment science and mgt. Married Derrick [name])  "3" 1 son- 34; 1 dtr "73" 2 grandchildren. 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 End of life-provided discussive context and provided packet.    Family History  Problem Relation Age of Onset  . Arthritis Mother     severe  . Coronary artery disease Mother   . Coronary artery disease Brother     Past Medical History  Diagnosis Date  . BENIGN PROSTATIC HYPERTROPHY, WITH OBSTRUCTION 05/28/2010  . COLONIC POLYPS, ADENOMATOUS, HX OF 05/28/2010  . GENERALIZED OSTEOARTHROSIS  UNSPECIFIED SITE 05/28/2010  . GERD 05/28/2010  . HYPERLIPIDEMIA 05/28/2010  . Intrinsic asthma, 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  . Malignant neoplasm of descending colon 05/28/2010  . MITRAL VALVE PROLAPSE 05/28/2010  . 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 PALPITATIONS, CHRONIC 05/28/2010  . SLEEP APNEA, OBSTRUCTIVE, MODERATE 05/28/2010  . UNSPECIFIED INFLAMMATORY AND TOXIC NEUROPATHY 05/28/2010  . VITAMIN D DEFICIENCY 05/28/2010  . CAD (coronary artery disease)     a. s/p NSTEMI 06/01/11: DES to RCA;  b. cath 06/25/11:  dLM 50-60% (FFR 0.87), prox to mid LAD 40-50%, D1 50%, pCFX 50%, RCA stent ok, dPDA 80%, EF 55-60%.  His FFR was felt to be negative and therefore medical therapy was recommended ;  echo 6/12: EF 55-60%, mild AS   . 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 Coronary artery disease 12/11/2011  . MI (myocardial infarction) 12/11/2011  . Vision loss   . 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 Sarcoidosis     pulmonalis  . Hypertension   . Parotid gland pain     infection  . Glaucoma 12/2012    open angle    Past Surgical History  Procedure Laterality Date  . Tonsillectomy  1950  . Colon surgery  2000    decending colon 200  . Lung wedge  2001  . Appendectomy  1954  . Knee surgery  2003  . Heart attack  06/03/11  . Cardiac catheterization  06/03/11    stent    Current Outpatient Prescriptions  Medication Sig Dispense Refill  . albuterol (PROVENTIL HFA) 108 (90 BASE) MCG/ACT inhaler Inhale 2 puffs into the lungs every 6 (six) hours as needed.        . ALPHAGAN P 0.1 % SOLN Place 1 drop into both eyes every 12 (twelve) hours.       [name] amLODipine (NORVASC) 10 MG tablet TAKE ONE TABLET BY MOUTH EVERY DAY  90 tablet  0  . aspirin 81 MG tablet Take 81 mg by mouth daily.        [name] atorvastatin (LIPITOR) 20 MG tablet TAKE ONE TABLET BY MOUTH EVERY DAY  90 tablet  2  . budesonide (PULMICORT) 180 MCG/ACT inhaler Inhale 2 puffs into the lungs 2 (two)  times daily.  3 each  3  . CELEBREX 200 MG capsule TAKE ONE CAPSULE BY MOUTH ONCE DAILY  90 capsule  0  . CHOLECALCIFEROL PO Take 1,000 mg by mouth daily. Vitamin D 3      . clopidogrel (PLAVIX) 75 MG tablet TAKE ONE TABLET BY MOUTH EVERY DAY  90 tablet  3  . dorzolamide (TRUSOPT) 2 % ophthalmic solution       . finasteride (PROSCAR) 5 MG tablet Take 5 mg by mouth daily.       . fluticasone (FLONASE) 50 MCG/ACT nasal spray USE TWO SPRAYS IN EACH NOSTRIL TWICE DAILY  48 g  1  . gabapentin (NEURONTIN) 800 MG tablet TAKE ONE TABLET BY MOUTH FOUR TIMES DAILY  360 tablet  3  . Multiple Vitamin (MULTIVITAMIN) tablet Take 1 tablet by mouth daily.        . nitroGLYCERIN (NITROSTAT) 0.4 MG SL tablet Place 0.4 mg under the tongue every 5 (five) minutes as needed. Pt advised not to take this while on celebrex      . pantoprazole (PROTONIX) 40 MG tablet Take 1 tablet (40 mg total) by mouth daily.  90 tablet  3  . tadalafil (CIALIS) 5 MG tablet Take 5 mg by mouth daily.      [name] triamterene-hydrochlorothiazide (MAXZIDE-25) 37.5-25 MG per tablet Take 1 tablet by mouth daily.  90 tablet  3   No current facility-administered medications for this visit.    Allergies as of 02/13/2014  . (No Known Allergies)    Vitals: BP 128/72  Pulse 56  Ht 5' 9.25" (1.759 m)  Wt 188 lb (85.276 kg)  BMI 27.56 kg/m2 Last Weight:  Wt Readings from Last 1 Encounters:  02/13/14 188 lb (  85.276 kg)   Last Height:   Ht Readings from Last 1 Encounters:  02/13/14 5' 9.25" (1.759 m)    Physical exam:  General: The patient is awake, alert and appears not in acute distress. The patient is well groomed. Head: Normocephalic, atraumatic. Neck is supple.  Mallampati 1, neck circumference: No lymph node swelling, no parotid swelling.  Cardiovascular:  Regular rate and rhythm, without  murmurs or carotid bruit, and without distended neck veins. Respiratory: Lungs are clear to auscultation. Skin:  Without evidence of edema, or  rash Trunk: BMI is reduced, the patient lost over 60 pounds over last 24 month, as a result of cardiac rehabilitation. Neurologic exam : The patient is awake and alert, oriented to place and time.  Memory subjective  described as intact.  There is a normal attention span & concentration ability. Speech is fluent without  dysarthria, dysphonia or aphasia. Mood and affect are appropriate.  Cranial nerves: Pupils are equal and briskly reactive to light. Funduscopic exam without  evidence of pallor or edema. Extraocular movements  in vertical and horizontal planes intact and without nystagmus. Visual fields by finger perimetry are intact. Hearing to finger rub intact.  Facial sensation intact to fine touch. Facial motor strength is symmetric and tongue and uvula move midline.  Motor exam:   Normal tone and normal muscle bulk and symmetric normal strength in all extremities. Good grip strength is noted.  He has a foot drop on the right foot, that is progressed since 2013.   Sensory:  Fine touch, pinprick and vibration were tested in all extremities. The patient has hyperesthesia in both planta pedis.   There is a slight decrease of vibratory sense at the ankle level, but the patient reports profound numbness - He also has lost proprioception , more recently this seems to to some degree affect his hands. Coordination: Rapid alternating movements in the fingers/hands is tested and normal.  Finger-to-nose maneuver tested and normal without evidence of ataxia, dysmetria or tremor.  Gait and station: Patient walks without assistive device and is able and assisted stool climb up to the exam table. Strength within normal limits.  Stance is stable and normal. Tandem gait is fragmented, he needs to look where he steps, lost his " sense of where in space " he is . Romberg testing is positive. Deep tendon reflexes: in the  upper and lower extremities are attenuated , he has a mild foot drop in the right -    Babinski maneuver response is downgoing.   Assessment:  Rituximab has reduced the patient's arthritic symptoms and has certainly beneficial effects on ankylosing spondylitis, the MG [place] may well be associated with these autoimmune disorders at baseline. His neuropathy also did not progress during the rituximab treatment. His last treatment was given in April  2014 and he is now on a " drug holiday" for almost one year .  He continues to be monitored by his hematologist-oncologist Dr. Murriel Hopper. He has  ankylosing spondylitis and neuropathy will likely progress if he is not treated with a specific immune modulation. His hyperpathia, hyperesthesias and dyseasthesias have increased.   As to his obstructive sleep apnea, there is no need for any adjustment at this time.  His current settings are comfortable and has residual AHI is very low. In office download confirmed high compliance 02-13-14 .   He is extremely intolerant to cold temperature, (does no consume alcohol) and has anhydrosis - this is more often seen in dysautonomia with  NP.   I would like to follow the patient once a year for the CPAP exclusively for sleep - RV in 12 month .    I refilled  Neurontin for his neuropathic symptoms. He has spinal stenosis - 4 times daily 400 mg neurontin. .   A revisit will be scheduled  in 12 months.

## 2014-02-15 ENCOUNTER — Encounter: Payer: Self-pay | Admitting: Cardiovascular Disease

## 2014-02-15 ENCOUNTER — Ambulatory Visit (INDEPENDENT_AMBULATORY_CARE_PROVIDER_SITE_OTHER): Payer: Medicare Other | Admitting: Cardiovascular Disease

## 2014-02-15 VITALS — BP 128/70 | HR 54 | Ht 70.0 in | Wt 186.0 lb

## 2014-02-15 DIAGNOSIS — I2510 Atherosclerotic heart disease of native coronary artery without angina pectoris: Secondary | ICD-10-CM

## 2014-02-15 NOTE — Progress Notes (Signed)
HPI:   73 year old gentleman present for followup of coronary atherosclerosis. He has a history of myocardial infarction and drug-eluting stent placement in the right coronary artery. 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 He's been managed medically. He has multiple noncardiac problems including CIDP, pulmonary sarcoid, and monoclonal gammopathy of undetermined significance. Despite his problems, he functions at a very high level.  The patient had a recent Myoview scan that demonstrated excellent exercise capacity with a negative EKG. There was inferior/inferolateral thinning consistent with soft tissue attenuation and a small region of ischemia in the distal inferolateral and inferior walls. Interpretation was difficult because of bowel activity. The EF was 64% with normal LV wall motion.  From a symptomatic perspective the patient is doing pretty well. He exercises regularly and has no symptoms with physical exertion. He specifically denies chest pain or pressure, dyspnea, edema, or palpitations. He continues to have occasional interscapular pain at nighttime. This generally occurs and has been associated with night sweats. There has been no recent change in the pattern of his pain.  Recent lipid showed cholesterol 154, triglycerides 52, HDL 57, and LDL 87.   Outpatient Encounter Prescriptions as of 02/15/2014  Medication Sig  . albuterol (PROVENTIL HFA) 108 (90 BASE) MCG/ACT inhaler Inhale 2 puffs into the lungs every 6 (six) hours as needed.    [name] amLODipine (NORVASC) 10 MG tablet TAKE ONE TABLET BY MOUTH EVERY DAY  . aspirin 81 MG tablet Take 81 mg by mouth daily.    [name] atorvastatin (LIPITOR) 20 MG tablet TAKE ONE TABLET BY MOUTH EVERY DAY  . budesonide (PULMICORT) 180 MCG/ACT inhaler Inhale 2 puffs into the lungs 2 (two) times daily.  . CELEBREX 200 MG capsule TAKE ONE CAPSULE BY MOUTH ONCE DAILY  . CHOLECALCIFEROL PO Take  1,000 mg by mouth daily. Vitamin D 3  . clopidogrel (PLAVIX) 75 MG tablet TAKE ONE TABLET BY MOUTH EVERY DAY  . dorzolamide (TRUSOPT) 2 % ophthalmic solution   . finasteride (PROSCAR) 5 MG tablet Take 5 mg by mouth daily.   . fluticasone (FLONASE) 50 MCG/ACT nasal spray USE TWO SPRAYS IN EACH NOSTRIL TWICE DAILY  . gabapentin (NEURONTIN) 800 MG tablet TAKE ONE TABLET BY MOUTH FOUR TIMES DAILY  . Multiple Vitamin (MULTIVITAMIN) tablet Take 1 tablet by mouth daily.    . nitroGLYCERIN (NITROSTAT) 0.4 MG SL tablet Place 0.4 mg under the tongue every 5 (five) minutes as needed. Pt advised not to take this while on celebrex  . pantoprazole (PROTONIX) 40 MG tablet Take 1 tablet (40 mg total) by mouth daily.  . tadalafil (CIALIS) 5 MG tablet Take 5 mg by mouth daily.  [name] triamterene-hydrochlorothiazide (MAXZIDE-25) 37.5-25 MG per tablet Take 1 tablet by mouth daily.  . [DISCONTINUED] ALPHAGAN P 0.1 % SOLN Place 1 drop into both eyes every 12 (twelve) hours.     No Known Allergies  Past Medical History  Diagnosis Date  . BENIGN PROSTATIC HYPERTROPHY, WITH OBSTRUCTION 05/28/2010  . COLONIC POLYPS, ADENOMATOUS, HX OF 05/28/2010  . GENERALIZED OSTEOARTHROSIS UNSPECIFIED SITE 05/28/2010  . GERD 05/28/2010  . HYPERLIPIDEMIA 05/28/2010  . Intrinsic asthma, 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  . Malignant neoplasm of descending colon 05/28/2010  . MITRAL VALVE PROLAPSE 05/28/2010  . 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 PALPITATIONS, CHRONIC 05/28/2010  . SLEEP APNEA, OBSTRUCTIVE, MODERATE 05/28/2010  . UNSPECIFIED INFLAMMATORY AND TOXIC NEUROPATHY 05/28/2010  . VITAMIN D DEFICIENCY 05/28/2010  . CAD (coronary artery disease)  a. s/p NSTEMI 06/01/11: DES to RCA;  b. cath 06/25/11:   dLM 50-60% (FFR 0.87), prox to mid LAD 40-50%, D1 50%, pCFX 50%, RCA stent ok, dPDA 80%, EF 55-60%.  His FFR was felt to be negative and therefore medical therapy was recommended ;  echo 6/12: EF 55-60%, mild AS   . Monoclonal gammopathy of undetermined  significance 12/11/2011  . Coronary artery disease 12/11/2011  . MI (myocardial infarction) 12/11/2011  . Vision loss   . 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 Sarcoidosis     pulmonalis  . Hypertension   . Parotid gland pain     infection  . 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  Stress Images: Moderate defect In the inferior wall (base, minimally mid, distal) Inferolateral wall (base, distal) inferoseptal wall (base) and apex. Otherwise normal perfusion.  Rest Images: Gut activity overlies inferolateral wall. Note imprved countsin in the mid/distal inferior,distal inferolateral wall.  Subtraction (SDS): Not significant quantitatively.  Transient Ischemic Dilatation (Normal <1.22): 1.02  Lung/Heart Ratio (Normal <0.45): 0.33  Quantitative Gated Spect Images  QGS EDV: 140 ml  QGS ESV: 50 ml  Impression  Exercise Capacity: Excellent exercise capacity.  BP Response: BP dropped at peak exercise below baseline. Recovered in recovery period.  Clinical Symptoms: No chest pain.  ECG Impression: See above Electrically negative.  Comparison with Prior Nuclear Study: No previous nuclear study performed  Overall Impression: Inferior/inferolateral thinning consistent with soft tissue attenuation and a small region of ischemia in the distal inferolateral, distal inferior walls. Bowel activity interferes with  rest imaging, making interpretation difficult  LV Ejection Fraction: 64%. LV Wall Motion: NL LV Function; NL Wall Motion  Dorris Carnes   ASSESSMENT AND PLAN: 1. Coronary artery disease, native vessel. The patient is stable without exertional symptoms. His moderate left mainstem disease has been managed medically. His stress test is a bit equivocal, but he certainly does not have high risk features or evidence of an anterolateral ischemia that would be expected with left mainstem obstruction. I have recommended continued medical therapy. I would like to see him back in the office in 6 months.  2. Hyperlipidemia. He is tolerating atorvastatin 20 mg and this will be continued. His lipids were reviewed as above.  3. Hypertension. Blood pressure is well controlled on amlodipine 10 mg along with triamterene/hydrochlorothiazide.  4. Cardiac murmur. Sounds like a benign outflow murmur. Will reassess next office visit and consider an echocardiogram if he develops some symptoms or change in murmur characteristics.  [name] 02/15/2014 6:25 PM

## 2014-02-15 NOTE — Patient Instructions (Signed)
Your physician wants you to follow-up in:  6 months. You will receive a reminder letter in the mail two months in advance. If you don't receive a letter, please call our office to schedule the follow-up appointment.   

## 2014-02-18 ENCOUNTER — Encounter: Payer: Self-pay | Admitting: Neurology

## 2014-02-23 ENCOUNTER — Telehealth: Payer: Self-pay | Admitting: Hematology and Oncology

## 2014-02-23 ENCOUNTER — Encounter: Payer: Self-pay | Admitting: Oncology

## 2014-02-23 NOTE — Telephone Encounter (Signed)
FORMER PT DR. GRANFORTUNA 

## 2014-03-02 ENCOUNTER — Other Ambulatory Visit: Payer: Self-pay | Admitting: Internal Medicine

## 2014-03-17 ENCOUNTER — Other Ambulatory Visit: Payer: Self-pay | Admitting: Oncology

## 2014-04-03 ENCOUNTER — Telehealth: Payer: Self-pay | Admitting: Internal Medicine

## 2014-04-03 NOTE — Telephone Encounter (Signed)
Pt of Dr. Linda Hedges needs refills.  He does not have a new provider.  He needs Pulmicort, Amlodipine and celebrex all in generic.  He uses Paediatric nurse on [address].

## 2014-04-03 NOTE — Telephone Encounter (Signed)
I have already taken my "50 pts" of Dr Linda Hedges allotment and several others.  Has this patient been assigned to a PCP, as I understood this was supposed to have been done.

## 2014-04-04 ENCOUNTER — Other Ambulatory Visit: Payer: Self-pay | Admitting: *Deleted

## 2014-04-04 ENCOUNTER — Telehealth: Payer: Self-pay | Admitting: *Deleted

## 2014-04-04 MED ORDER — BUDESONIDE 180 MCG/ACT IN AEPB: 2.0000 | INHALATION_SPRAY | Freq: Two times a day (BID) | RESPIRATORY_TRACT | Status: DC

## 2014-04-04 MED ORDER — CELECOXIB 200 MG PO CAPS: ORAL_CAPSULE | ORAL | Status: DC

## 2014-04-04 MED ORDER — AMLODIPINE BESYLATE 10 MG PO TABS: ORAL_TABLET | ORAL | Status: DC

## 2014-04-04 NOTE — Telephone Encounter (Signed)
Pt called states he is a former pt of Dr Linda Hedges and his son, Aeron Donaghey is a pt of yours.  He is requesting that you take his wife and himself as your pts.  Pt states the [place] office is more convenient for them.  Please advise

## 2014-04-04 NOTE — Telephone Encounter (Signed)
Spoke with pt advised of MDs message and gave information to schedulers for appointments.

## 2014-04-04 NOTE — Telephone Encounter (Signed)
Ok with me 

## 2014-04-08 ENCOUNTER — Encounter: Payer: Self-pay | Admitting: Neurology

## 2014-04-12 ENCOUNTER — Telehealth: Payer: Self-pay | Admitting: Hematology and Oncology

## 2014-04-12 NOTE — Telephone Encounter (Signed)
pt called to r/s MD visit...done....pt aware of new d.t

## 2014-04-15 ENCOUNTER — Other Ambulatory Visit: Payer: Self-pay | Admitting: Hematology and Oncology

## 2014-04-15 ENCOUNTER — Encounter (INDEPENDENT_AMBULATORY_CARE_PROVIDER_SITE_OTHER): Payer: Self-pay

## 2014-04-15 ENCOUNTER — Ambulatory Visit (INDEPENDENT_AMBULATORY_CARE_PROVIDER_SITE_OTHER): Payer: Medicare Other | Admitting: Neurology

## 2014-04-15 ENCOUNTER — Other Ambulatory Visit: Payer: Medicare Other

## 2014-04-15 DIAGNOSIS — D472 Monoclonal gammopathy: Secondary | ICD-10-CM

## 2014-04-15 DIAGNOSIS — G63 Polyneuropathy in diseases classified elsewhere: Secondary | ICD-10-CM

## 2014-04-15 DIAGNOSIS — Z0289 Encounter for other administrative examinations: Secondary | ICD-10-CM

## 2014-04-15 DIAGNOSIS — H409 Unspecified glaucoma: Secondary | ICD-10-CM | POA: Diagnosis not present

## 2014-04-15 DIAGNOSIS — H4011X0 Primary open-angle glaucoma, stage unspecified: Secondary | ICD-10-CM | POA: Diagnosis not present

## 2014-04-15 DIAGNOSIS — D869 Sarcoidosis, unspecified: Secondary | ICD-10-CM

## 2014-04-15 DIAGNOSIS — M48061 Spinal stenosis, lumbar region without neurogenic claudication: Secondary | ICD-10-CM

## 2014-04-15 DIAGNOSIS — G5600 Carpal tunnel syndrome, unspecified upper limb: Secondary | ICD-10-CM

## 2014-04-15 NOTE — Procedures (Signed)
     HISTORY:  Derrick [name] is a 73 year old gentleman with a history of a IgM MGUS, treated in the past with rituximab. The patient has been diagnosed with CIDP in 2004 , and he currently followed in this area. The patient is being reevaluated for the neuropathy. The patient also reports some issues with sciatica type pain down the right leg. He has a history of pulmonary sarcoidosis as well. The patient feels better when he is up on his feet, worse when he is sitting.  NERVE CONDUCTION STUDIES:  Nerve conduction studies were performed on both upper extremities. The distal motor latencies for the median nerves were prolonged bilaterally, with normal motor amplitudes for these nerves bilaterally. The distal motor latency and motor amplitude for the right ulnar nerve was normal. The F wave latencies for the median nerves were borderline normal on the right, normal on the left, and normal for the right ulnar nerve. The nerve conduction velocities for the median nerves bilaterally and for the right ulnar nerve were normal. The sensory latencies for the median nerves were prolonged bilaterally, normal for the right ulnar nerve.  Nerve conduction studies were performed on both lower extremities. The distal motor latencies for the peroneal and posterior tibial nerves were normal bilaterally, with low motor amplitudes for the peroneal nerves bilaterally, normal for the posterior tibial nerves bilaterally. Nerve conduction velocities were normal for the peroneal and posterior tibial nerves bilaterally. The sural and peroneal sensory latencies were absent bilaterally.  EMG STUDIES:  EMG study was performed on the right lower extremity:  The tibialis anterior muscle reveals 2 to 4K motor units with full recruitment. No fibrillations or positive waves were seen. The peroneus tertius muscle reveals 2 to 4K motor units with full recruitment. No fibrillations or positive waves were seen. The medial  gastrocnemius muscle reveals 1 to 3K motor units with full recruitment. No fibrillations or positive waves were seen. The vastus lateralis muscle reveals 2 to 4K motor units with full recruitment. No fibrillations or positive waves were seen. The iliopsoas muscle reveals 2 to 4K motor units with full recruitment. No fibrillations or positive waves were seen. The biceps femoris muscle (long head) reveals 2 to 4K motor units with full recruitment. No fibrillations or positive waves were seen. The lumbosacral paraspinal muscles were tested at 3 levels, and revealed no abnormalities of insertional activity at all 3 levels tested. There was good relaxation.   IMPRESSION:  Nerve conduction studies done on all 4 extremities reveals evidence of mild bilateral carpal tunnel syndrome. There does appear to be evidence of a primarily axonal peripheral neuropathy of moderate severity. This study does not appear to be consistent with CIDP. EMG evaluation of the right lower extremity shows no evidence of an overlying lumbosacral radiculopathy, the study of the right leg was surprisingly normal.  Jill Alexanders MD 04/15/2014 4:36 PM  [facility] [address] [place], [place] [address]  Phone [phone] Fax [phone]

## 2014-04-16 ENCOUNTER — Other Ambulatory Visit (HOSPITAL_BASED_OUTPATIENT_CLINIC_OR_DEPARTMENT_OTHER): Payer: Medicare Other

## 2014-04-16 DIAGNOSIS — D472 Monoclonal gammopathy: Secondary | ICD-10-CM

## 2014-04-16 DIAGNOSIS — D869 Sarcoidosis, unspecified: Secondary | ICD-10-CM

## 2014-04-16 LAB — COMPREHENSIVE METABOLIC PANEL (CC13)
ALT: 27 U/L (ref 0–55)
AST: 28 U/L (ref 5–34)
Albumin: 4.4 g/dL (ref 3.5–5.0)
Alkaline Phosphatase: 55 U/L (ref 40–150)
Anion Gap: 10 mEq/L (ref 3–11)
BUN: 22.5 mg/dL (ref 7.0–26.0)
CO2: 28 mEq/L (ref 22–29)
Calcium: 10.5 mg/dL — ABNORMAL HIGH (ref 8.4–10.4)
Chloride: 104 mEq/L (ref 98–109)
Creatinine: 1.1 mg/dL (ref 0.7–1.3)
Glucose: 125 mg/dl (ref 70–140)
Potassium: 4.1 mEq/L (ref 3.5–5.1)
Sodium: 142 mEq/L (ref 136–145)
Total Bilirubin: 0.92 mg/dL (ref 0.20–1.20)
Total Protein: 7.1 g/dL (ref 6.4–8.3)

## 2014-04-16 LAB — CBC WITH DIFFERENTIAL/PLATELET
BASO%: 0.4 % (ref 0.0–2.0)
Basophils Absolute: 0 10*3/uL (ref 0.0–0.1)
EOS%: 5.5 % (ref 0.0–7.0)
Eosinophils Absolute: 0.2 10*3/uL (ref 0.0–0.5)
HCT: 40.4 % (ref 38.4–49.9)
HGB: 13.8 g/dL (ref 13.0–17.1)
LYMPH%: 17.8 % (ref 14.0–49.0)
MCH: 31.3 pg (ref 27.2–33.4)
MCHC: 34.2 g/dL (ref 32.0–36.0)
MCV: 91.7 fL (ref 79.3–98.0)
MONO#: 0.4 10*3/uL (ref 0.1–0.9)
MONO%: 10.1 % (ref 0.0–14.0)
NEUT#: 2.7 10*3/uL (ref 1.5–6.5)
NEUT%: 66.2 % (ref 39.0–75.0)
Platelets: 175 10*3/uL (ref 140–400)
RBC: 4.41 10*6/uL (ref 4.20–5.82)
RDW: 13.4 % (ref 11.0–14.6)
WBC: 4.1 10*3/uL (ref 4.0–10.3)
lymph#: 0.7 10*3/uL — ABNORMAL LOW (ref 0.9–3.3)

## 2014-04-16 LAB — LACTATE DEHYDROGENASE (CC13): LDH: 213 U/L (ref 125–245)

## 2014-04-17 ENCOUNTER — Ambulatory Visit: Payer: Medicare Other | Admitting: Nurse Practitioner

## 2014-04-17 LAB — IGG, IGA, IGM
IgA: 127 mg/dL (ref 68–379)
IgG (Immunoglobin G), Serum: 761 mg/dL (ref 650–1600)
IgM, Serum: 240 mg/dL (ref 41–251)

## 2014-04-22 ENCOUNTER — Ambulatory Visit: Payer: Medicare Other | Admitting: Hematology and Oncology

## 2014-04-29 NOTE — Progress Notes (Signed)
Quick Note:  Spoke with patient and relayed NCS/EMG results, per Dr. Brett Fairy. ______

## 2014-05-06 ENCOUNTER — Encounter: Payer: Self-pay | Admitting: Internal Medicine

## 2014-05-06 ENCOUNTER — Ambulatory Visit (INDEPENDENT_AMBULATORY_CARE_PROVIDER_SITE_OTHER): Payer: Medicare Other | Admitting: Internal Medicine

## 2014-05-06 VITALS — BP 130/60 | HR 52 | Temp 98.3°F | Resp 16 | Ht 70.0 in | Wt 191.0 lb

## 2014-05-06 DIAGNOSIS — J45909 Unspecified asthma, uncomplicated: Secondary | ICD-10-CM

## 2014-05-06 DIAGNOSIS — E559 Vitamin D deficiency, unspecified: Secondary | ICD-10-CM

## 2014-05-06 DIAGNOSIS — N138 Other obstructive and reflux uropathy: Secondary | ICD-10-CM

## 2014-05-06 DIAGNOSIS — C186 Malignant neoplasm of descending colon: Secondary | ICD-10-CM | POA: Diagnosis not present

## 2014-05-06 DIAGNOSIS — I2510 Atherosclerotic heart disease of native coronary artery without angina pectoris: Secondary | ICD-10-CM | POA: Diagnosis not present

## 2014-05-06 DIAGNOSIS — I10 Essential (primary) hypertension: Secondary | ICD-10-CM | POA: Diagnosis not present

## 2014-05-06 DIAGNOSIS — N401 Enlarged prostate with lower urinary tract symptoms: Secondary | ICD-10-CM

## 2014-05-06 DIAGNOSIS — E785 Hyperlipidemia, unspecified: Secondary | ICD-10-CM

## 2014-05-06 DIAGNOSIS — G4733 Obstructive sleep apnea (adult) (pediatric): Secondary | ICD-10-CM

## 2014-05-06 DIAGNOSIS — Z0000 Encounter for general adult medical examination without abnormal findings: Secondary | ICD-10-CM

## 2014-05-06 NOTE — Progress Notes (Signed)
Pre visit review using our clinic review tool, if applicable. No additional management support is needed unless otherwise documented below in the visit note. 

## 2014-05-06 NOTE — Patient Instructions (Signed)

## 2014-05-06 NOTE — Progress Notes (Signed)
Subjective:    Patient ID: Derrick [name], male    DOB: 11-09-1941, 73 y.o.   MRN: [id number]  Hypertension This is a chronic problem. The current episode started more than 1 year ago. The problem is unchanged. The problem is controlled. Pertinent negatives include no anxiety, blurred vision, chest pain, headaches, malaise/fatigue, neck pain, orthopnea, palpitations, peripheral edema, PND, shortness of breath or sweats. Agents associated with hypertension include NSAIDs. Past treatments include diuretics and calcium channel blockers. The current treatment provides significant improvement. There are no compliance problems.  Hypertensive end-organ damage includes CAD/MI.      Review of Systems  Constitutional: Negative.  Negative for fever, chills, malaise/fatigue, diaphoresis, appetite change and fatigue.  HENT: Negative.   Eyes: Negative.  Negative for blurred vision.  Respiratory: Negative.  Negative for shortness of breath.   Cardiovascular: Negative.  Negative for chest pain, palpitations, orthopnea, leg swelling and PND.  Gastrointestinal: Negative.  Negative for nausea, vomiting, abdominal pain, diarrhea, constipation and blood in stool.  Endocrine: Negative.   Genitourinary: Negative.   Musculoskeletal: Positive for arthralgias (both knees). Negative for back pain, gait problem, joint swelling, myalgias, neck pain and neck stiffness.  Skin: Negative.   Allergic/Immunologic: Negative.   Neurological: Negative.  Negative for headaches.  Hematological: Negative.  Negative for adenopathy. Does not bruise/bleed easily.  Psychiatric/Behavioral: Negative.   All other systems reviewed and are negative.      Objective:   Physical Exam  Vitals reviewed. Constitutional: He appears well-developed and well-nourished. No distress.  HENT:  Head: Normocephalic and atraumatic.  Mouth/Throat: Oropharynx is clear and moist. No oropharyngeal exudate.  Eyes: Conjunctivae are normal. Right eye  exhibits no discharge. Left eye exhibits no discharge. No scleral icterus.  Neck: Normal range of motion. Neck supple. No JVD present. No tracheal deviation present. No thyromegaly present.  Cardiovascular: Normal rate, regular rhythm, S1 normal, S2 normal and intact distal pulses.  Exam reveals no gallop and no friction rub.   Murmur heard.  Decrescendo systolic murmur is present with a grade of 2/6   Diastolic murmur is present with a grade of 1/6  Pulses:      Carotid pulses are 1+ on the right side, and 1+ on the left side.      Radial pulses are 1+ on the right side, and 1+ on the left side.       Femoral pulses are 1+ on the right side, and 1+ on the left side.      Popliteal pulses are 1+ on the right side, and 1+ on the left side.       Dorsalis pedis pulses are 1+ on the right side, and 1+ on the left side.       Posterior tibial pulses are 1+ on the right side, and 1+ on the left side.  Pulmonary/Chest: Effort normal and breath sounds normal. No stridor. No respiratory distress. He has no wheezes. He has no rales. He exhibits no tenderness.  Abdominal: Soft. Bowel sounds are normal. He exhibits no distension and no mass. There is no tenderness. There is no rebound and no guarding.  Lymphadenopathy:    He has no cervical adenopathy.  Skin: He is not diaphoretic.      Lab Results  Component Value Date   WBC 4.1 04/16/2014   HGB 13.8 04/16/2014   HCT 40.4 04/16/2014   PLT 175 04/16/2014   GLUCOSE 125 04/16/2014   CHOL 154 12/31/2013   TRIG 52.0 12/31/2013   HDL  56.90 12/31/2013   LDLCALC 87 12/31/2013   ALT 27 04/16/2014   AST 28 04/16/2014   NA 142 04/16/2014   K 4.1 04/16/2014   CL 102 12/31/2013   CREATININE 1.1 04/16/2014   BUN 22.5 04/16/2014   CO2 28 04/16/2014   TSH 2.31 12/13/2013   INR 1.2* 06/22/2011      Assessment & Plan:

## 2014-05-07 ENCOUNTER — Ambulatory Visit (HOSPITAL_BASED_OUTPATIENT_CLINIC_OR_DEPARTMENT_OTHER): Payer: Medicare Other | Admitting: Hematology and Oncology

## 2014-05-07 ENCOUNTER — Encounter: Payer: Self-pay | Admitting: Hematology and Oncology

## 2014-05-07 VITALS — BP 129/64 | HR 53 | Temp 98.2°F | Resp 18 | Ht 70.0 in | Wt 190.6 lb

## 2014-05-07 DIAGNOSIS — I10 Essential (primary) hypertension: Secondary | ICD-10-CM | POA: Insufficient documentation

## 2014-05-07 DIAGNOSIS — D472 Monoclonal gammopathy: Secondary | ICD-10-CM

## 2014-05-07 DIAGNOSIS — I2510 Atherosclerotic heart disease of native coronary artery without angina pectoris: Secondary | ICD-10-CM | POA: Diagnosis not present

## 2014-05-07 NOTE — Assessment & Plan Note (Signed)
He is due for a repeat colonoscopy

## 2014-05-07 NOTE — Progress Notes (Signed)
[facility] progress notes  Patient Care Team: Janith Lima, MD as PCP - General (Internal Medicine) Heath Lark, MD as Consulting Physician (Hematology and Oncology)  CHIEF COMPLAINTS/PURPOSE OF VISIT:  History of CIDP and IgM monoclonal gammopathy, no evidence of active disease  HISTORY OF PRESENTING ILLNESS:  Derrick [name] 73 y.o. male was transferred to my care after his prior physician has left.  I reviewed the patient's records extensive and collaborated the history with the patient. 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 He was initially evaluated and treated by a hematologist and a neurologist in [place] before he moved to [place]. 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 He had bone marrow aspirate and biopsy which showed no evidence of malignancy in his bone marrow. His neurologic condition is characterized primarily by painful distal neuropathy, feet worse than hands, and lower extremity weakness. At one point he could barely walk a block without having to stop due to pain and weakness. He was given an initial trial of Rituxan in 2010 with no significant improvement. However following reinitiation of Rituxan in April 2012 he did get a progressive improvement. He was put on a maintenance program which we have continued in this office on an every other month basis through 03/23/2013.   Since he was last seen here, his complained of persistent numbness and pain at the lower extremities. He denies any problems with fine motor skills of his hands. The patient had recurrent cramps in his lower extremity which he felt slightly worse since discontinuation of treatment. He denies any new neurological deficit. The patient also has diagnosis of  sarcoidosis but he denies any cough, chest pain or shortness breath. He also had a remote history of colon cancer, resected, without the need for adjuvant treatment. He is due for another screening colonoscopy. He denies any change in bowel habits. Denies any melena or hematochezia or unexplained weight loss.  MEDICAL HISTORY:  Past Medical History  Diagnosis Date  . BENIGN PROSTATIC HYPERTROPHY, WITH OBSTRUCTION 05/28/2010  . COLONIC POLYPS, ADENOMATOUS, HX OF 05/28/2010  . GENERALIZED OSTEOARTHROSIS UNSPECIFIED SITE 05/28/2010  . GERD 05/28/2010  . HYPERLIPIDEMIA 05/28/2010  . Intrinsic asthma, 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  . Malignant neoplasm of descending colon 05/28/2010  . MITRAL VALVE PROLAPSE 05/28/2010  . 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 PALPITATIONS, CHRONIC 05/28/2010  . SLEEP APNEA, OBSTRUCTIVE, MODERATE 05/28/2010  . UNSPECIFIED INFLAMMATORY AND TOXIC NEUROPATHY 05/28/2010  . VITAMIN D DEFICIENCY 05/28/2010  . CAD (coronary artery disease)     a. s/p NSTEMI 06/01/11: DES to RCA;  b. cath 06/25/11:   dLM 50-60% (FFR 0.87), prox to mid LAD 40-50%, D1 50%, pCFX 50%, RCA stent ok, dPDA 80%, EF 55-60%.  His FFR was felt to be negative and therefore medical therapy was recommended ;  echo 6/12: EF 55-60%, mild AS   . 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 Coronary artery disease 12/11/2011  . MI (myocardial infarction) 12/11/2011  . Vision loss   . 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 Sarcoidosis     pulmonalis  . Hypertension   . Parotid gland pain     infection  . 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  Tonsillectomy  1950  . Colon surgery  2000    decending colon 200  . Lung wedge  2001  . Appendectomy  1954  . Knee surgery  2003  . Heart attack  06/03/11  . Cardiac catheterization  06/03/11    stent    SOCIAL HISTORY: History   Social History  . Marital Status:  Married    Spouse Name: N/A    Number of Children: 2  . Years of Education: MS-Coll.   Occupational History  . retired     Building services engineer   Social History Main Topics  . Smoking status: Never Smoker   . Smokeless tobacco: Never Used  . Alcohol Use: No  . Drug Use: No  . Sexual Activity: Yes    Birth Control/ Protection: None   Other Topics Concern  . Not on file   Social History Narrative   College- [employer]; [employer]-environment science and mgt. Married [name])  "37" 1 son- 68; 1 dtr "42" 2 grandchildren. 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 End of life-provided discussive context and provided packet.    FAMILY HISTORY: Family History  Problem Relation Age of Onset  . Arthritis Mother     severe  . Coronary artery disease Mother   . Coronary artery disease Brother   . Cancer Brother     choriocarcinoma    ALLERGIES:  has No Known Allergies.  MEDICATIONS:  Current Outpatient Prescriptions  Medication Sig Dispense Refill  . albuterol (PROVENTIL HFA) 108 (90 BASE) MCG/ACT inhaler Inhale 2 puffs into the lungs every 6 (six) hours as needed.        [name] amLODipine (NORVASC) 10 MG tablet TAKE ONE TABLET BY MOUTH EVERY DAY  90 tablet  0  . aspirin 81 MG tablet Take 81 mg by mouth daily.        [name] atorvastatin (LIPITOR) 20 MG tablet TAKE ONE TABLET BY MOUTH EVERY DAY  90 tablet  2  . bimatoprost (LUMIGAN) 0.01 % SOLN Place 1 drop into both eyes at bedtime.      . budesonide (PULMICORT) 180 MCG/ACT inhaler Inhale 2 puffs into the lungs 2 (two) times daily.  3 each  3  . celecoxib (CELEBREX) 200 MG capsule TAKE ONE CAPSULE BY MOUTH ONCE DAILY  90 capsule  0  . CHOLECALCIFEROL PO Take 1,000 mg by mouth daily. Vitamin D 3      . clopidogrel (PLAVIX) 75 MG tablet TAKE ONE TABLET BY MOUTH EVERY DAY  90 tablet  3  . dorzolamide (TRUSOPT) 2 % ophthalmic solution       . finasteride (PROSCAR) 5 MG tablet Take 5 mg by mouth daily.       . fluticasone (FLONASE) 50  MCG/ACT nasal spray INSTILL TWO SPRAY(S) IN EACH NOSTRIL TWICE DAILY  48 g  3  . gabapentin (NEURONTIN) 800 MG tablet TAKE ONE TABLET BY MOUTH FOUR TIMES DAILY  360 tablet  3  . Multiple Vitamin (MULTIVITAMIN) tablet Take 1 tablet by mouth daily.        . nitroGLYCERIN (NITROSTAT) 0.4 MG SL tablet Place 0.4 mg under the tongue every 5 (five) minutes as needed. Pt advised not to take this while on celebrex      . pantoprazole (PROTONIX) 40 MG tablet Take 1 tablet (40 mg total) by mouth daily.  90 tablet  3  . tadalafil (CIALIS) 5 MG tablet Take 5 mg by mouth daily.      [name] triamterene-hydrochlorothiazide (MAXZIDE-25) 37.5-25 MG  per tablet Take 1 tablet by mouth daily.  90 tablet  3   No current facility-administered medications for this visit.    REVIEW OF SYSTEMS:   Constitutional: Denies fevers, chills or abnormal night sweats Eyes: Denies blurriness of vision, double vision or watery eyes Ears, nose, mouth, throat, and face: Denies mucositis or sore throat Respiratory: Denies cough, dyspnea or wheezes Cardiovascular: Denies palpitation, chest discomfort or lower extremity swelling Gastrointestinal:  Denies nausea, heartburn or change in bowel habits Skin: Denies abnormal skin rashes Lymphatics: Denies new lymphadenopathy or easy bruising Behavioral/Psych: Mood is stable, no new changes  All other systems were reviewed with the patient and are negative.  PHYSICAL EXAMINATION: ECOG PERFORMANCE STATUS: 1 - Symptomatic but completely ambulatory  Filed Vitals:   05/07/14 1255  BP: 129/64  Pulse: 53  Temp: 98.2 F (36.8 C)  Resp: 18   Filed Weights   05/07/14 1255  Weight: 190 lb 9.6 oz (86.456 kg)    GENERAL:alert, no distress and comfortable SKIN: skin color, texture, turgor are normal, no rashes or significant lesions EYES: normal, conjunctiva are pink and non-injected, sclera clear OROPHARYNX:no exudate, normal lips, buccal mucosa, and tongue  NECK: supple, thyroid normal  size, non-tender, without nodularity LYMPH:  no palpable lymphadenopathy in the cervical, axillary or inguinal LUNGS: clear to auscultation and percussion with normal breathing effort HEART: regular rate & rhythm and no murmurs without lower extremity edema ABDOMEN:abdomen soft, non-tender and normal bowel sounds Musculoskeletal:no cyanosis of digits and no clubbing  PSYCH: alert & oriented x 3 with fluent speech NEURO: no focal motor/sensory deficits  LABORATORY DATA:  I have reviewed the data as listed Lab Results  Component Value Date   WBC 4.1 04/16/2014   HGB 13.8 04/16/2014   HCT 40.4 04/16/2014   MCV 91.7 04/16/2014   PLT 175 04/16/2014    Recent Labs  07/02/13 0926 10/01/13 1109 12/31/13 1556 04/16/14 0810  NA  --  141 140 142  K  --  4.0 4.0 4.1  CL  --   --  102  --   CO2  --  30* 33* 28  GLUCOSE  --  94 76 125  BUN  --  13.0 19 22.5  CREATININE  --  0.9 1.0 1.1  CALCIUM  --  10.5* 10.1 10.5*  PROT 6.9 7.0 7.1 7.1  ALBUMIN 4.3 4.0 4.7 4.4  AST 22 19 22 28   ALT 18 16 20 27   ALKPHOS 51 53 43 55  BILITOT 0.7 0.66 0.8 0.92  BILIDIR 0.1  --  0.1  --    ASSESSMENT & PLAN:  #1 IgM MGUS His last IgM level is satisfactory. In fact it is improving compared to 6 months ago. There is no indication to treat. #2 chronic painful neuropathy #3 chronic inflammatory demyelinating polyneuropathy Certainly he could have neuropathy associated with MGUS. Continue conservative management. #4 history of resected colon cancer Stage is indeterminate. He will continue followup with his gastroenterologist for surveillance colonoscopy. The patient is currently not anemic or symptomatic. Orders Placed This Encounter  Procedures  . CBC with Differential    Standing Status: Future     Number of Occurrences:      Standing Expiration Date: 05/07/2015  . Comprehensive metabolic panel    Standing Status: Future     Number of Occurrences:      Standing Expiration Date: 05/07/2015  . SPEP  & IFE with QIG    Standing Status: Future  Number of Occurrences:      Standing Expiration Date: 05/07/2015  . Kappa/lambda light chains    Standing Status: Future     Number of Occurrences:      Standing Expiration Date: 05/07/2015  . Beta 2 microglobulin, serum    Standing Status: Future     Number of Occurrences:      Standing Expiration Date: 05/08/2015    All questions were answered. The patient knows to call the clinic with any problems, questions or concerns. I spent 25 minutes counseling the patient face to face. The total time spent in the appointment was 30 minutes and more than 50% was on counseling.     Heath Lark, MD 05/07/2014 2:27 PM

## 2014-05-07 NOTE — Assessment & Plan Note (Signed)
His BP is well controlled 

## 2014-05-10 ENCOUNTER — Telehealth: Payer: Self-pay | Admitting: Hematology and Oncology

## 2014-05-10 NOTE — Telephone Encounter (Signed)
lvm for pt regarding to [facility] appt...mailed pt appt sched/avs and lette

## 2014-06-10 ENCOUNTER — Encounter: Payer: Self-pay | Admitting: Internal Medicine

## 2014-06-19 ENCOUNTER — Telehealth: Payer: Self-pay | Admitting: Internal Medicine

## 2014-06-20 ENCOUNTER — Encounter: Payer: Self-pay | Admitting: Internal Medicine

## 2014-06-20 NOTE — Telephone Encounter (Signed)
Put records on Dr. Hilarie Fredrickson desk to review

## 2014-06-27 ENCOUNTER — Other Ambulatory Visit: Payer: Self-pay | Admitting: Cardiovascular Disease

## 2014-07-01 ENCOUNTER — Other Ambulatory Visit: Payer: Self-pay

## 2014-07-01 ENCOUNTER — Telehealth: Payer: Self-pay | Admitting: Cardiovascular Disease

## 2014-07-01 MED ORDER — AMLODIPINE BESYLATE 10 MG PO TABS: ORAL_TABLET | ORAL | Status: DC

## 2014-07-01 NOTE — Telephone Encounter (Signed)
[facility]  HAS  BEEN REFILLED .Adonis Housekeeper

## 2014-07-01 NOTE — Telephone Encounter (Signed)
Left MSG on triage requesting his amlodipine to be sent to [place]. Pharmacist states they sent request on last thurs needing med today. Called pt back inform him Dr. Ronnald Ramp assistant has already sent to [place]...Derrick [name]

## 2014-07-01 NOTE — Telephone Encounter (Signed)
New message     Refill amlodipine 10mg  daily---90day refill to [facility] on [address].  Dr Delray Alt prescribed it but he has retired.  He want to transfer it to Dr Burt Knack.  Pt has 1 pill left and he is leaving tomorrow to go out of the country.  If this is a problem--please call ASAP

## 2014-07-17 ENCOUNTER — Other Ambulatory Visit: Payer: Self-pay | Admitting: Cardiovascular Disease

## 2014-07-18 ENCOUNTER — Telehealth: Payer: Self-pay | Admitting: Internal Medicine

## 2014-07-18 MED ORDER — CELECOXIB 200 MG PO CAPS: ORAL_CAPSULE | ORAL | Status: DC

## 2014-07-18 NOTE — Telephone Encounter (Signed)
Patient called requesting new rx for generic celebrex. He states his pharmacy has faxed [place] multiple times. Pt uses [facility] on [address]

## 2014-07-18 NOTE — Telephone Encounter (Signed)
Notified pt rx sent to [facility].../lmb 

## 2014-07-25 ENCOUNTER — Telehealth: Payer: Self-pay | Admitting: Cardiovascular Disease

## 2014-07-25 NOTE — Telephone Encounter (Signed)
Calling stating he had to cancel his appointment on 8/26 w/Dr. Burt Knack due to being out of town.  Wanted to reschedule 1st of Sept but there were no appointments available.  First available that is suitable with him will be in Oct.  Made him an appointment for 10/23 at 11:45.  He will call back to see if someone has cancelled later.

## 2014-07-25 NOTE — Telephone Encounter (Signed)
New problem:      Pt is going out of town on 8/26 was canceled,  pt would like to see Dr Burt Knack please give him a call.  Pt will be in town the first 3 weeks of Sept.   Thank you!

## 2014-08-13 DIAGNOSIS — H2510 Age-related nuclear cataract, unspecified eye: Secondary | ICD-10-CM | POA: Diagnosis not present

## 2014-08-13 DIAGNOSIS — H409 Unspecified glaucoma: Secondary | ICD-10-CM | POA: Diagnosis not present

## 2014-08-13 DIAGNOSIS — H4011X0 Primary open-angle glaucoma, stage unspecified: Secondary | ICD-10-CM | POA: Diagnosis not present

## 2014-08-13 DIAGNOSIS — H25019 Cortical age-related cataract, unspecified eye: Secondary | ICD-10-CM | POA: Diagnosis not present

## 2014-08-21 ENCOUNTER — Ambulatory Visit: Payer: Medicare Other | Admitting: Cardiovascular Disease

## 2014-08-26 ENCOUNTER — Telehealth: Payer: Self-pay

## 2014-08-26 NOTE — Telephone Encounter (Signed)
I am already overbooked on this day, but I am willing to see him in clinic with the knowledge that my time will be somewhat limited and need to be split between he and his wife Having records from previous procedures/GI history would be very helpful at that time. Also, I would be surprised if he has not had another colonoscopy since 2000 when he was diagnosed with CRC

## 2014-08-26 NOTE — Telephone Encounter (Signed)
Dr. Hilarie Fredrickson please see dissertation below. Pt wants to be seen by you on 08/28/14 when you see his wife. Please advise.

## 2014-08-26 NOTE — Telephone Encounter (Signed)
PT IS 73 YO WITH HX OF COLON CA.  He was scheduled for PV this Wed and procedure/colon on 9/14 but he is on Plavix.  He feels this is our error since no one caught that he was on Plavix when this was scheduled and he is "put out with not ever meeting Dr Hilarie Fredrickson himself".  RN offered to make appt with JMP but the 2 schedules do not work at the moment.  Pt does plan on flying home tomorrow from the [facility] with his wife who has an appt to see JMP on Wed 08/28/14 in the office at [facility].    Pt wants to know if Dr Hilarie Fredrickson can work him in as well as he will be with his wife on Wednesday and was under the impression that he would be seeing the MD on that day not the nurse only.  He is calling me (the [place]) in the am before boarding the plane.  He has requested that I send Dr Hilarie Fredrickson a note.  He also stated that he takes his health very seriously.  He was diagnosed with colon CA in 2000.  This will be his first colonoscopy since that time.  He stated that he feels anxious b/c he is "overdue".  Please advise how to proceed.  Appt made for 9/11 with Amy, PA at this time but pt is not satisfied and wants to see JMP instead.

## 2014-08-27 NOTE — Telephone Encounter (Signed)
I will let the pt know when he calls.  I located a copy of his last report which you reviewed.  You are right.  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  I will bring the records down and place on your desk.  Also you made a specific comment to schedule assuming no blood thinners.  Scheduler made note when she made his appt (via his wife) stating no blood thinners, so wife was asked at time appt was made,.

## 2014-08-27 NOTE — Telephone Encounter (Signed)
Levada Dy please let the pt know that Dr. Hilarie Fredrickson can see him 08/28/14@10 :15am.

## 2014-08-28 ENCOUNTER — Encounter: Payer: Self-pay | Admitting: Internal Medicine

## 2014-08-28 ENCOUNTER — Ambulatory Visit (INDEPENDENT_AMBULATORY_CARE_PROVIDER_SITE_OTHER): Payer: Medicare Other | Admitting: Internal Medicine

## 2014-08-28 ENCOUNTER — Telehealth: Payer: Self-pay | Admitting: *Deleted

## 2014-08-28 ENCOUNTER — Telehealth: Payer: Self-pay | Admitting: Neurology

## 2014-08-28 VITALS — BP 130/60 | HR 60 | Ht 68.0 in | Wt 192.2 lb

## 2014-08-28 DIAGNOSIS — K2100 Gastro-esophageal reflux disease with esophagitis, without bleeding: Secondary | ICD-10-CM

## 2014-08-28 DIAGNOSIS — I2510 Atherosclerotic heart disease of native coronary artery without angina pectoris: Secondary | ICD-10-CM | POA: Diagnosis not present

## 2014-08-28 DIAGNOSIS — Z85038 Personal history of other malignant neoplasm of large intestine: Secondary | ICD-10-CM | POA: Diagnosis not present

## 2014-08-28 DIAGNOSIS — Z7902 Long term (current) use of antithrombotics/antiplatelets: Secondary | ICD-10-CM

## 2014-08-28 DIAGNOSIS — Z86010 Personal history of colonic polyps: Secondary | ICD-10-CM

## 2014-08-28 MED ORDER — PEG-KCL-NACL-NASULF-NA ASC-C 100 G PO SOLR: 1.0000 | Freq: Once | ORAL | Status: DC

## 2014-08-28 NOTE — Telephone Encounter (Signed)
08/28/2014   RE: Derrick [name] DOB: November 30, 1941 MRN: [id number]   Dear  Burt Knack,,    We have scheduled the above patient for an endoscopic procedure. Our records show that he is on anticoagulation therapy.   Please advise as to how long the patient may come off his therapy of Plavix prior to the procedure, which is scheduled .  Please fax back/ or route the completed form to [place] at [phone].   Sincerely,    Genella Mech

## 2014-08-28 NOTE — Progress Notes (Signed)
Patient ID: Derrick [name], male   DOB: 1941-10-24, 73 y.o.   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 Ronnald Ramp to consider repeat colonoscopy. He is here today with his wife. His last colonoscopy was performed on 05/13/2010 by Dr. Lindi Adie in [place] and he reports he is due surveillance colonoscopy. His bowel habits have been regular and he denies blood in his stool or melena. No abdominal pain. He does have a history of heartburn and takes pantoprazole 40 mg daily. With this his symptoms are well controlled. He denies dysphagia or odynophagia. He reports previous endoscopy was performed with no evidence of Barrett's that he recalls an irregular Z line. He was told he would need another endoscopy this year. No hepatobiliary complaints today. No chest pain or dyspnea.  Past Medical History  Diagnosis Date  . BENIGN PROSTATIC HYPERTROPHY, WITH OBSTRUCTION 05/28/2010  . COLONIC POLYPS, ADENOMATOUS, HX OF 05/28/2010  . GENERALIZED OSTEOARTHROSIS UNSPECIFIED SITE 05/28/2010  . GERD 05/28/2010  . HYPERLIPIDEMIA 05/28/2010  . Intrinsic asthma, 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  . Malignant neoplasm of descending colon 05/28/2010  . MITRAL VALVE PROLAPSE 05/28/2010  . 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 PALPITATIONS, CHRONIC 05/28/2010  . SLEEP APNEA, OBSTRUCTIVE, MODERATE 05/28/2010  . UNSPECIFIED INFLAMMATORY AND TOXIC NEUROPATHY 05/28/2010  . VITAMIN D DEFICIENCY 05/28/2010  . CAD (coronary artery disease)     a. s/p NSTEMI 06/01/11: DES to RCA;  b. cath 06/25/11:   dLM 50-60% (FFR 0.87), prox to mid LAD 40-50%, D1 50%, pCFX 50%, RCA stent ok, dPDA 80%, EF 55-60%.  His FFR was felt to be negative and therefore  medical therapy was recommended ;  echo 6/12: EF 55-60%, mild AS   . 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 Coronary artery disease 12/11/2011  . MI (myocardial infarction) 12/11/2011  . Vision loss   . 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 Sarcoidosis     pulmonalis  . Hypertension   . Parotid gland pain     infection  . 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 COPD (chronic obstructive pulmonary disease)   . Diverticulitis   . Colon cancer 2000  . Pneumonia     Past Surgical History  Procedure Laterality Date  . Tonsillectomy  1950  . Colon surgery  2000    decending colon 200  . Lung wedge  2001  . Appendectomy  1954  . Knee surgery  2003  . Heart attack  06/03/11  . Cardiac catheterization  06/03/11    stent    Outpatient Prescriptions Prior to Visit  Medication Sig Dispense Refill  . albuterol (PROVENTIL HFA) 108 (90 BASE) MCG/ACT inhaler Inhale 2 puffs into the lungs every 6 (six) hours as needed.        [name] amLODipine (NORVASC) 10 MG tablet TAKE ONE TABLET BY MOUTH EVERY DAY  90 tablet  4  . aspirin 81 MG tablet Take 81 mg by mouth daily.        [name] atorvastatin (LIPITOR) 20 MG tablet TAKE ONE TABLET BY MOUTH ONCE DAILY  90 tablet  0  . bimatoprost (LUMIGAN) 0.01 % SOLN Place 1 drop into both eyes at bedtime.      [name]  budesonide (PULMICORT) 180 MCG/ACT inhaler Inhale 2 puffs into the lungs 2 (two) times daily.  3 each  3  . celecoxib (CELEBREX) 200 MG capsule TAKE ONE CAPSULE BY MOUTH ONCE DAILY  90 capsule  0  . CHOLECALCIFEROL PO Take 1,000 mg by mouth daily. Vitamin D 3      . clopidogrel (PLAVIX) 75 MG tablet TAKE ONE TABLET BY MOUTH ONCE DAILY  90 tablet  3  . dorzolamide (TRUSOPT) 2 % ophthalmic solution       . finasteride (PROSCAR) 5 MG tablet Take 5 mg by mouth daily.       . fluticasone (FLONASE) 50 MCG/ACT nasal spray INSTILL TWO SPRAY(S) IN EACH NOSTRIL TWICE DAILY  48 g  3  . gabapentin (NEURONTIN) 800 MG tablet  TAKE ONE TABLET BY MOUTH FOUR TIMES DAILY  360 tablet  3  . Multiple Vitamin (MULTIVITAMIN) tablet Take 1 tablet by mouth daily.        . nitroGLYCERIN (NITROSTAT) 0.4 MG SL tablet Place 0.4 mg under the tongue every 5 (five) minutes as needed. Pt advised not to take this while on celebrex      . pantoprazole (PROTONIX) 40 MG tablet TAKE ONE TABLET BY MOUTH ONCE DAILY  90 tablet  3  . tadalafil (CIALIS) 5 MG tablet Take 5 mg by mouth daily.      [name] triamterene-hydrochlorothiazide (MAXZIDE-25) 37.5-25 MG per tablet Take 1 tablet by mouth daily.  90 tablet  3   No facility-administered medications prior to visit.    No Known Allergies  Family History  Problem Relation Age of Onset  . Arthritis Mother     severe  . Coronary artery disease Mother   . Coronary artery disease Brother   . Cancer Brother     choriocarcinoma  . Colon polyps Mother     History  Substance Use Topics  . Smoking status: Never Smoker   . Smokeless tobacco: Never Used  . Alcohol Use: Yes     Comment: Occassionally    ROS: As per history of present illness, otherwise negative  BP 130/60  Pulse 60  Ht 5\' 8"  (1.727 m)  Wt 192 lb 4 oz (87.204 kg)  BMI 29.24 kg/m2 Constitutional: Well-developed and well-nourished. No distress. HEENT: Normocephalic and atraumatic. Oropharynx is clear and moist. No oropharyngeal exudate. Conjunctivae are normal.  No scleral icterus. Neck: Neck supple. Trachea midline. Cardiovascular: Normal rate, regular rhythm and intact distal pulses. 2/6 SEM Pulmonary/chest: Effort normal and breath sounds normal. No wheezing, rales or rhonchi. Abdominal: Soft, nontender, nondistended. Bowel sounds active throughout. There are no masses palpable. No hepatosplenomegaly. Well-healed lower Donald scar Extremities: no clubbing, cyanosis, or edema Lymphadenopathy: No cervical adenopathy noted. Neurological: Alert and oriented to person place and time. Skin: Skin is warm and dry. No rashes  noted. Psychiatric: Normal mood and affect. Behavior is normal.  RELEVANT LABS AND IMAGING: CBC    Component Value Date/Time   WBC 4.1 04/16/2014 0810   WBC 4.9 12/31/2013 1556   RBC 4.41 04/16/2014 0810   RBC 4.51 12/31/2013 1556   HGB 13.8 04/16/2014 0810   HGB 13.9 12/31/2013 1556   HCT 40.4 04/16/2014 0810   HCT 41.2 12/31/2013 1556   PLT 175 04/16/2014 0810   PLT 191.0 12/31/2013 1556   MCV 91.7 04/16/2014 0810   MCV 91.4 12/31/2013 1556   MCH 31.3 04/16/2014 0810   MCH 31.2 06/05/2011 0350   MCHC 34.2 04/16/2014 0810   MCHC 33.9 12/31/2013  1556   RDW 13.4 04/16/2014 0810   RDW 13.3 12/31/2013 1556   LYMPHSABS 0.7* 04/16/2014 0810   LYMPHSABS 0.9 12/31/2013 1556   MONOABS 0.4 04/16/2014 0810   MONOABS 0.6 12/31/2013 1556   EOSABS 0.2 04/16/2014 0810   EOSABS 0.2 12/31/2013 1556   BASOSABS 0.0 04/16/2014 0810   BASOSABS 0.0 12/31/2013 1556    CMP     Component Value Date/Time   NA 142 04/16/2014 0810   NA 140 12/31/2013 1556   K 4.1 04/16/2014 0810   K 4.0 12/31/2013 1556   CL 102 12/31/2013 1556   CL 104 03/23/2013 0855   CO2 28 04/16/2014 0810   CO2 33* 12/31/2013 1556   GLUCOSE 125 04/16/2014 0810   GLUCOSE 76 12/31/2013 1556   GLUCOSE 98 03/23/2013 0855   BUN 22.5 04/16/2014 0810   BUN 19 12/31/2013 1556   CREATININE 1.1 04/16/2014 0810   CREATININE 1.0 12/31/2013 1556   CREATININE 0.87 07/30/2011 0733   CALCIUM 10.5* 04/16/2014 0810   CALCIUM 10.1 12/31/2013 1556   PROT 7.1 04/16/2014 0810   PROT 7.1 12/31/2013 1556   ALBUMIN 4.4 04/16/2014 0810   ALBUMIN 4.7 12/31/2013 1556   AST 28 04/16/2014 0810   AST 22 12/31/2013 1556   ALT 27 04/16/2014 0810   ALT 20 12/31/2013 1556   ALKPHOS 55 04/16/2014 0810   ALKPHOS 43 12/31/2013 1556   BILITOT 0.92 04/16/2014 0810   BILITOT 0.8 12/31/2013 1556   GFRNONAA >60 06/05/2011 0350   GFRAA >60 06/05/2011 0350   Pathology reviewed from upper and lower endoscopy performed in May 2011 -- terminal ileal biopsy normal., Right colon biopsies normal. Second portion of the duodenum biopsies  normal no evidence of sarcoid, lymphoma, dysplasia, H. pylori or celiac disease. Antral biopsies reactive gastropathy with mild chronic gastritis, inactive. Esophageal biopsy mild chronic esophagitis with features suggestive of acid reflux. 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 Ronnald Ramp to consider repeat colonoscopy.   1. History of colon cancer/elevated risk screening and colorectal cancer surveillance -- given his history of colorectal cancer and multiple adenomatous colon polyps, I have recommended repeat colonoscopy at this time. This will be performed with a 2 day prep based on results of previous colonoscopy preps. The test was discussed including risks and benefits and he is agreeable to proceed. His Plavix will need to be held and we will seek permission from his cardiologist, Dr. Burt Knack, to hold Plavix 5 days before his procedure.  2. EGD with history of esophagitis -- no Barrett's esophagus on biopsy in 2011. However given esophagitis, we will repeat upper endoscopy on the same day as his colonoscopy. The test was discussed including the risks and benefits and he is agreeable to proceed. He will continue pantoprazole 40 mg daily  3. CAD on Plavix -- Plavix will need to be held for GI procedures as discussed above. We are contacting Dr. Burt Knack in this regard.

## 2014-08-28 NOTE — Patient Instructions (Signed)
You have been scheduled for a /Endoscopy colonoscopy. Please follow written instructions given to you at your visit today.  Please pick up your prep kit at the pharmacy within the next 1-3 days. If you use inhalers (even only as needed), please bring them with you on the day of your procedure. Your physician has requested that you go to www.startemmi.com and enter the access code given to you at your visit today. This web site gives a general overview about your procedure. However, you should still follow specific instructions given to you by our office regarding your preparation for the procedure.   You will be contacted by our office prior to your procedure for directions on holding your Plavix.  If you do not hear from our office 1 week prior to your scheduled procedure, please call [phone] to discuss.

## 2014-08-28 NOTE — Telephone Encounter (Signed)
Patient called to see if he had labs ordered by Dr. Brett Fairy, in reviewing his chart no labs are currently ordered by neurologist.  Only order at end of his last visit was NCV/EMG, patient was made aware.

## 2014-08-28 NOTE — Telephone Encounter (Signed)
Ok to hold plavix 5 days prior to endoscopy and resume when safe from a bleeding risk perspective. thx

## 2014-08-29 NOTE — Telephone Encounter (Signed)
Ok to hold plavix per Dr Burt Knack  Patient aware

## 2014-09-04 DIAGNOSIS — H2510 Age-related nuclear cataract, unspecified eye: Secondary | ICD-10-CM | POA: Diagnosis not present

## 2014-09-04 DIAGNOSIS — H25019 Cortical age-related cataract, unspecified eye: Secondary | ICD-10-CM | POA: Diagnosis not present

## 2014-09-04 DIAGNOSIS — H2589 Other age-related cataract: Secondary | ICD-10-CM | POA: Diagnosis not present

## 2014-09-05 ENCOUNTER — Other Ambulatory Visit: Payer: Self-pay | Admitting: *Deleted

## 2014-09-05 MED ORDER — ALBUTEROL SULFATE HFA 108 (90 BASE) MCG/ACT IN AERS: 2.0000 | INHALATION_SPRAY | Freq: Four times a day (QID) | RESPIRATORY_TRACT | Status: DC | PRN

## 2014-09-05 NOTE — Telephone Encounter (Signed)
Left msg on triage his albuterol has expired requesting refill sent to [facility]. Called pt back inform him will send...Johny Chess

## 2014-09-06 ENCOUNTER — Ambulatory Visit: Payer: Medicare Other | Admitting: Physician Assistant

## 2014-09-06 DIAGNOSIS — D239 Other benign neoplasm of skin, unspecified: Secondary | ICD-10-CM | POA: Diagnosis not present

## 2014-09-06 DIAGNOSIS — D1801 Hemangioma of skin and subcutaneous tissue: Secondary | ICD-10-CM | POA: Diagnosis not present

## 2014-09-06 DIAGNOSIS — Z85828 Personal history of other malignant neoplasm of skin: Secondary | ICD-10-CM | POA: Diagnosis not present

## 2014-09-06 DIAGNOSIS — L819 Disorder of pigmentation, unspecified: Secondary | ICD-10-CM | POA: Diagnosis not present

## 2014-09-06 DIAGNOSIS — L821 Other seborrheic keratosis: Secondary | ICD-10-CM | POA: Diagnosis not present

## 2014-09-06 DIAGNOSIS — L820 Inflamed seborrheic keratosis: Secondary | ICD-10-CM | POA: Diagnosis not present

## 2014-09-09 ENCOUNTER — Encounter: Payer: Medicare Other | Admitting: Internal Medicine

## 2014-09-09 ENCOUNTER — Ambulatory Visit (AMBULATORY_SURGERY_CENTER): Payer: Medicare Other | Admitting: Internal Medicine

## 2014-09-09 ENCOUNTER — Encounter: Payer: Self-pay | Admitting: Internal Medicine

## 2014-09-09 VITALS — BP 140/71 | HR 51 | Temp 96.7°F | Resp 16

## 2014-09-09 DIAGNOSIS — K209 Esophagitis, unspecified without bleeding: Secondary | ICD-10-CM | POA: Diagnosis not present

## 2014-09-09 DIAGNOSIS — I10 Essential (primary) hypertension: Secondary | ICD-10-CM | POA: Diagnosis not present

## 2014-09-09 DIAGNOSIS — Z85038 Personal history of other malignant neoplasm of large intestine: Secondary | ICD-10-CM

## 2014-09-09 DIAGNOSIS — I252 Old myocardial infarction: Secondary | ICD-10-CM | POA: Diagnosis not present

## 2014-09-09 DIAGNOSIS — Z86010 Personal history of colonic polyps: Secondary | ICD-10-CM

## 2014-09-09 DIAGNOSIS — D126 Benign neoplasm of colon, unspecified: Secondary | ICD-10-CM

## 2014-09-09 DIAGNOSIS — J449 Chronic obstructive pulmonary disease, unspecified: Secondary | ICD-10-CM | POA: Diagnosis not present

## 2014-09-09 DIAGNOSIS — K219 Gastro-esophageal reflux disease without esophagitis: Secondary | ICD-10-CM | POA: Diagnosis not present

## 2014-09-09 DIAGNOSIS — D869 Sarcoidosis, unspecified: Secondary | ICD-10-CM | POA: Diagnosis not present

## 2014-09-09 DIAGNOSIS — R002 Palpitations: Secondary | ICD-10-CM | POA: Diagnosis not present

## 2014-09-09 DIAGNOSIS — K2100 Gastro-esophageal reflux disease with esophagitis, without bleeding: Secondary | ICD-10-CM

## 2014-09-09 DIAGNOSIS — I2510 Atherosclerotic heart disease of native coronary artery without angina pectoris: Secondary | ICD-10-CM | POA: Diagnosis not present

## 2014-09-09 DIAGNOSIS — G4733 Obstructive sleep apnea (adult) (pediatric): Secondary | ICD-10-CM | POA: Diagnosis not present

## 2014-09-09 LAB — HM COLONOSCOPY

## 2014-09-09 MED ORDER — SODIUM CHLORIDE 0.9 % IV SOLN: 500.0000 mL | INTRAVENOUS | Status: DC

## 2014-09-09 NOTE — Progress Notes (Signed)
Report to PACU, RN, vss, BBS= Clear.  

## 2014-09-09 NOTE — Progress Notes (Addendum)
Per Dr. Hilarie Fredrickson, ok for pt to continue his ASA today  Regarding Celebrex, Dr. Hilarie Fredrickson asks that pt stay off of it for 2 weeks.  But, if he isn't able to, ok to resume

## 2014-09-09 NOTE — Patient Instructions (Addendum)
YOU HAD AN ENDOSCOPIC PROCEDURE TODAY AT THE [facility]: Refer to the procedure report that was given to you for any specific questions about what was found during the examination.  If the procedure report does not answer your questions, please call your gastroenterologist to clarify.  If you requested that your care partner not be given the details of your procedure findings, then the procedure report has been included in a sealed envelope for you to review at your convenience later.  YOU SHOULD EXPECT: Some feelings of bloating in the abdomen. Passage of more gas than usual.  Walking can help get rid of the air that was put into your GI tract during the procedure and reduce the bloating. If you had a lower endoscopy (such as a colonoscopy or flexible sigmoidoscopy) you may notice spotting of blood in your stool or on the toilet paper. If you underwent a bowel prep for your procedure, then you may not have a normal bowel movement for a few days.  DIET: Your first meal following the procedure should be a light meal and then it is ok to progress to your normal diet.  A half-sandwich or bowl of soup is an example of a good first meal.  Heavy or fried foods are harder to digest and may make you feel nauseous or bloated.  Likewise meals heavy in dairy and vegetables can cause extra gas to form and this can also increase the bloating.  Drink plenty of fluids but you should avoid alcoholic beverages for 24 hours.  ACTIVITY: Your care partner should take you home directly after the procedure.  You should plan to take it easy, moving slowly for the rest of the day.  You can resume normal activity the day after the procedure however you should NOT DRIVE or use heavy machinery for 24 hours (because of the sedation medicines used during the test).    SYMPTOMS TO REPORT IMMEDIATELY: A gastroenterologist can be reached at any hour.  During normal business hours, 8:30 AM to 5:00 PM Monday through Friday,  call [phone].  After hours and on weekends, please call the GI answering service at [phone] who will take a message and have the physician on call contact you.   Following lower endoscopy (colonoscopy or flexible sigmoidoscopy):  Excessive amounts of blood in the stool  Significant tenderness or worsening of abdominal pains  Swelling of the abdomen that is new, acute  Fever of 100F or higher  Following upper endoscopy (EGD)  Vomiting of blood or coffee ground material  New chest pain or pain under the shoulder blades  Painful or persistently difficult swallowing  New shortness of breath  Fever of 100F or higher  Black, tarry-looking stools  FOLLOW UP: If any biopsies were taken you will be contacted by phone or by letter within the next 1-3 weeks.  Call your gastroenterologist if you have not heard about the biopsies in 3 weeks.  Our staff will call the home number listed on your records the next business day following your procedure to check on you and address any questions or concerns that you may have at that time regarding the information given to you following your procedure. This is a courtesy call and so if there is no answer at the home number and we have not heard from you through the emergency physician on call, we will assume that you have returned to your regular daily activities without incident.  SIGNATURES/CONFIDENTIALITY: You and/or your care   partner have signed paperwork which will be entered into your electronic medical record.  These signatures attest to the fact that that the information above on your After Visit Summary has been reviewed and is understood.  Full responsibility of the confidentiality of this discharge information lies with you and/or your care-partner.  Await pathology  Continue your Pantoprazole daily- best to take 20 to 30 minutes before the first meal of the day  Please read over handout about polyps  Avoid NSAIDS (Ibuprofen,  Advil, Aleve, Motrin) for two weeks- Tylenol is ok  Ok to resume Plavix tomorrow (09-10-14), Aspirin in ok to resume today  Hold Celebrex for 2 weeks if possible, if you are not able to, that is ok

## 2014-09-09 NOTE — Op Note (Signed)
[place]  [address] [place], [address]   ENDOSCOPY PROCEDURE REPORT  PATIENT: Derrick [name], Derrick [name]  MR#: [id number] BIRTHDATE: 1941-05-18 , 73  yrs. old GENDER: Male ENDOSCOPIST: Jerene Bears, MD REFERRED BY:  Janith Lima, M.D. PROCEDURE DATE:  09/09/2014 PROCEDURE:  EGD w/ biopsy ASA CLASS:     Class III INDICATIONS:  history of GERD.  history of reflux esophagitis MEDICATIONS: MAC sedation, administered by CRNA and propofol (Diprivan) 200mg  IV TOPICAL ANESTHETIC: none  DESCRIPTION OF PROCEDURE: After the risks benefits and alternatives of the procedure were thoroughly explained, informed consent was obtained.  The LB [id number] endoscope was introduced through the mouth and advanced to the second portion of the duodenum. Without limitations.  The instrument was slowly withdrawn as the mucosa was fully examined.     ESOPHAGUS: An slightly irregular Z-line was observed 40 cm from the incisors.  There was a few small island of salmon-colored mucosa just above the Z-line.  Multiple biopsies were performed using cold forceps.  Sample obtained to rule out Barrett's esophagus.   The esophagus was otherwise normal.  STOMACH: The mucosa of the stomach appeared normal.   Few, very small proximal gastric polyps, fundic-gland appearing  DUODENUM: The duodenal mucosa showed no abnormalities in the bulb and second portion of the duodenum.  Retroflexed views revealed no abnormalities.     The scope was then withdrawn from the patient and the procedure completed.  COMPLICATIONS: There were no complications. ENDOSCOPIC IMPRESSION: 1.   Slightly irregular Z-line was observed 40 cm from the incisors; multiple biopsies 2.   The esophagus was otherwise normal. 3.   The mucosa of the stomach appeared normal 4.   The duodenal mucosa showed no abnormalities in the bulb and second portion of the duodenum  RECOMMENDATIONS: 1.  Await biopsy results 2.  Continue  taking your PPI (pantoprazole) once daily.  It is best to be taken 20-30 minutes prior to breakfast meal.   eSigned:  Jerene Bears, MD 09/09/2014 10:45 AM      CC:The Patient and Janith Lima, MD

## 2014-09-09 NOTE — Op Note (Signed)
[place]  [address]. [place], [address]   COLONOSCOPY PROCEDURE REPORT  PATIENT: Derrick [name], Derrick [name]  MR#: [id number] BIRTHDATE: [birth year] , 73  yrs. old GENDER: Male ENDOSCOPIST: Jerene Bears, MD REFERRED TS:[id number] Evalina Field, M.D. PROCEDURE DATE:  09/09/2014 PROCEDURE:   Colonoscopy with snare polypectomy First Screening Colonoscopy - Avg.  risk and is 50 yrs.  old or older - No.  Prior Negative Screening - Now for repeat screening. N/A  History of Adenoma - Now for follow-up colonoscopy & has been > or = to 3 yrs.  Yes hx of adenoma.  Has been 3 or more years since last colonoscopy.  Polyps Removed Today? Yes. ASA CLASS:   Class III INDICATIONS:personal history of sigmoid colon cancer s/p resection, history of adenomatous and tubulovillous adenomas.  last colonoscopy May 2011 MEDICATIONS: MAC sedation, administered by CRNA and propofol (Diprivan) 200mg  IV  DESCRIPTION OF PROCEDURE:   After the risks benefits and alternatives of the procedure were thoroughly explained, informed consent was obtained.  A digital rectal exam revealed no rectal mass.   The LB [id number]  endoscope was introduced through the anus and advanced to the cecum, which was identified by both the appendix and ileocecal valve. No adverse events experienced. The quality of the prep was good, using MoviPrep (2day) The instrument was then slowly withdrawn as the colon was fully examined.    COLON FINDINGS: Three sessile polyps measuring 3, 3, and 8 mm in size were found at the cecum (2) and in the ascending colon. Polypectomy was performed using cold snare.  All resections were complete and all polyp tissue was completely retrieved. There was persistent oozing from ascending colon polypectomy site, 1 hemostatic clip was used with success to control bleeding.  Two sessile polyps ranging between 3-22mm in size were found in the descending colon and rectosigmoid colon.  Polypectomy was  performed using cold snare.  All resections were complete and all polyp tissue was completely retrieved.   There was evidence of a normal appearing prior surgical anastomosis in the sigmoid colon. Retroflexed views revealed internal hemorrhoids. The time to cecum=3 minutes 03 seconds.  Withdrawal time=20 minutes 04 seconds. The scope was withdrawn and the procedure completed.  COMPLICATIONS: There were no complications.  ENDOSCOPIC IMPRESSION: 1.   Three sessile polyps measuring 3, 3, and 8 mm in size were found at the cecum and in the ascending colon; Polypectomy was performed using cold snare; 1 hemostatic clip used at ascending colon polypectomy base to control oozing with success 2.   Two sessile polyps ranging between 3-88mm in size were found in the descending colon and rectosigmoid colon; Polypectomy was performed using cold snare 3.   There was evidence of prior surgical anastomosis in the sigmoid colon  RECOMMENDATIONS: 1.  Avoid all NSAIDS for the next 2 weeks. 2.  Okay to resume Plavix tomorrow 3.  Await pathology results 4.  Repeat Colonoscopy in 3 years. 5.  You will receive a letter within 1-2 weeks with the results of your biopsy as well as final recommendations.  Please call my office if you have not received a letter after 3 weeks.   eSigned:  Jerene Bears, MD 09/09/2014 10:51 AM   cc: The Patient and Janith Lima, MD   PATIENT NAME:  Derrick [name] MR#: [id number]

## 2014-09-09 NOTE — Progress Notes (Signed)
Called to room to assist during endoscopic procedure.  Patient ID and intended procedure confirmed with present staff. Received instructions for my participation in the procedure from the performing physician.  

## 2014-09-10 ENCOUNTER — Telehealth: Payer: Self-pay | Admitting: *Deleted

## 2014-09-10 NOTE — Telephone Encounter (Signed)
  Follow up Call-  Call back number 09/09/2014  Post procedure Call Back phone  # [phone]  Permission to leave phone message Yes     Patient questions:  Do you have a fever, pain , or abdominal swelling? No. Pain Score  0 *  Have you tolerated food without any problems? Yes.    Have you been able to return to your normal activities? Yes.    Do you have any questions about your discharge instructions: Diet   No. Medications  No. Follow up visit  No.  Do you have questions or concerns about your Care? No.  Actions: * If pain score is 4 or above: No action needed, pain <4.

## 2014-09-12 ENCOUNTER — Ambulatory Visit (INDEPENDENT_AMBULATORY_CARE_PROVIDER_SITE_OTHER): Payer: Medicare Other

## 2014-09-12 ENCOUNTER — Ambulatory Visit: Payer: Medicare Other

## 2014-09-12 DIAGNOSIS — Z23 Encounter for immunization: Secondary | ICD-10-CM

## 2014-09-13 ENCOUNTER — Encounter: Payer: Self-pay | Admitting: Internal Medicine

## 2014-09-18 DIAGNOSIS — Z961 Presence of intraocular lens: Secondary | ICD-10-CM | POA: Diagnosis not present

## 2014-09-18 DIAGNOSIS — H43819 Vitreous degeneration, unspecified eye: Secondary | ICD-10-CM | POA: Diagnosis not present

## 2014-09-20 ENCOUNTER — Other Ambulatory Visit: Payer: Self-pay | Admitting: Cardiovascular Disease

## 2014-10-01 ENCOUNTER — Encounter (HOSPITAL_COMMUNITY): Payer: Self-pay | Admitting: Cardiovascular Disease

## 2014-10-02 ENCOUNTER — Other Ambulatory Visit (HOSPITAL_COMMUNITY): Payer: Self-pay | Admitting: *Deleted

## 2014-10-02 DIAGNOSIS — I6523 Occlusion and stenosis of bilateral carotid arteries: Secondary | ICD-10-CM

## 2014-10-07 ENCOUNTER — Other Ambulatory Visit: Payer: Self-pay | Admitting: Internal Medicine

## 2014-10-09 DIAGNOSIS — R972 Elevated prostate specific antigen [PSA]: Secondary | ICD-10-CM | POA: Diagnosis not present

## 2014-10-09 LAB — PSA: PSA: 3.1

## 2014-10-16 ENCOUNTER — Ambulatory Visit (INDEPENDENT_AMBULATORY_CARE_PROVIDER_SITE_OTHER): Payer: Medicare Other | Admitting: Neurology

## 2014-10-16 ENCOUNTER — Encounter: Payer: Self-pay | Admitting: Neurology

## 2014-10-16 VITALS — BP 133/69 | HR 60 | Temp 98.1°F | Resp 12 | Ht 69.0 in | Wt 190.0 lb

## 2014-10-16 DIAGNOSIS — D472 Monoclonal gammopathy: Secondary | ICD-10-CM

## 2014-10-16 DIAGNOSIS — Z9989 Dependence on other enabling machines and devices: Secondary | ICD-10-CM

## 2014-10-16 DIAGNOSIS — G63 Polyneuropathy in diseases classified elsewhere: Secondary | ICD-10-CM

## 2014-10-16 DIAGNOSIS — I2510 Atherosclerotic heart disease of native coronary artery without angina pectoris: Secondary | ICD-10-CM

## 2014-10-16 DIAGNOSIS — G6289 Other specified polyneuropathies: Secondary | ICD-10-CM | POA: Diagnosis not present

## 2014-10-16 DIAGNOSIS — G4733 Obstructive sleep apnea (adult) (pediatric): Secondary | ICD-10-CM

## 2014-10-16 DIAGNOSIS — N5201 Erectile dysfunction due to arterial insufficiency: Secondary | ICD-10-CM | POA: Diagnosis not present

## 2014-10-16 DIAGNOSIS — R972 Elevated prostate specific antigen [PSA]: Secondary | ICD-10-CM | POA: Diagnosis not present

## 2014-10-16 DIAGNOSIS — N400 Enlarged prostate without lower urinary tract symptoms: Secondary | ICD-10-CM | POA: Diagnosis not present

## 2014-10-16 HISTORY — DX: Other specified polyneuropathies: G62.89

## 2014-10-16 NOTE — Progress Notes (Addendum)
[facility]  Provider:  Larey Seat, M D  Referring Provider: Neena Rhymes, MD Primary Care Physician:  Derrick Calico, MD  Chief Complaint  Patient presents with  . RV sleep    Rm 11, alone   MGUS neuropathy, ankylsing spondylitis and OSA/ sarcoidosis.   HPI:       Derrick [name] is a 73 y.o. male , caucasian of french, scott -irish  and native [place] ( [name]) ancestry,.  Is seen here as a revisit  from Derrick [name] . His PCP is  Derrick [name] , his ophthalmologist is Derrick [name].   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  He had been for at least 12 years CPAP treatment at a pressure of 16 cm water,  he was also using a full face mask at the time , which was comfortable.  His original diagnostic sleep study took place in 2000. The patient endorsed  Epworth sleepiness score at the of 24 points in the FSS at an elevated rate of 42 points as well. His BMI at the time was 30.  Meanwhile he had lost 65 pounds.  Derrick [name]  underwent a sleep study on 11-30-2011. His AHI at time of study was 42.1 and his RDI 45.5 his REM AHI was 30 and non-REM 42.9 he did not have any supine sleep. He had oxygen desaturations to a nadir of 82% and had a total desaturation time of 52.6 minutes with a the first 3 hours of sleep.The results were indicative of severe sleep apnea and  the patient was titrated to CPAP on 12-05-2011 at the pressure of only 8 cm. He had REM rebound at this setting ,the residual AHI in the night was 0.3 per hour. He also recommended the use of a nasal pillow of possible. The  CPAP was download it in 2013 after 90 days of CPAP he was with a new pressure. He had 100% compliance rate and residual AHI of 3.4 with only moderate to mild air leak that pressure was 8 cm  water. There are peak time per day was 8 hours and 48 minutes. CPAP still set at 8 cm water with  3 cm EPR.  The air leak has slightly increased the residual AHI is 2.2,  even better than last year. The  therapeutic time is 8 hour and 53 minutes.  The patient has 100% compliance. Today he endorsed Epworth sleepiness score at 0 point and the fatigue severity score at only 15 points.  Geriatric depression score was endorsed at he only scored 1 point not suggestive of clinical depression.   Interval history ; Derrick [name] has also a monoclonal gammopathy and has been treated with rituximab the medication had continuously improved his exercise tolerance, finally he was able to walk 30-40 minutes a day.  He has been exercising daily.  He did develop not neuropathic pain,  but some and knee pain on the right and an MRI was now showing DDD on the correspondin spinal level.  He has  completed his rituximab treatment with Derrick [name]. in February 2014 , he underwent a total of 3  steroid injections at the L5-S1 distribution which had meanwhile shown a benefit.  He has been on gabapentin for neuropathic pain, seems to be related to his MGUS  diagnosis .He had developed a severe cold intolerance progressively actually the last year.  He has noticed since March 2014  and since his Retuximab treatment was completed, that he has some cold cramping as well and toe cramps,  pain and numbness.   Last  timeiI ordered NCS and EMG.  This time cold  Derrick [name] did a EMG and NCS, and found axonal damage , not a finding of CIDP.  This was after 3 years of MGUS treatment and I answered his questions, "did I ever have CIDP'? I believe ,yes. 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  The patient has hp pain but is ambulatory, he periodically loses control of his foot lifting muscles - stretching  exercises help.       Review of Systems: Out of a complete 14 system review, the patient complains of only the following symptoms, and all other reviewed systems are negative. The patient no longer has any residual daytime sleepiness, his sleep on CPAP has been restorative. He describes no nocturia.  He reports ongoing problems probably worsening problems with his hands " falling asleep" and  feeling tingly and numb.  Cold intolernace.  This bothers,  especially when operating machinery the at the steering wheel and his car or there the handle of his lawn mower.   History   Social History  . Marital Status: Married    Spouse Name: N/A    Number of Children: 2  . Years of Education: MS-Coll.   Occupational History  . Consultant     Chemical Engineer-former   Social History Main Topics  . Smoking status: Never Smoker   . Smokeless tobacco: Never Used  . Alcohol Use: Yes     Comment: Occassionally  . Drug Use: No  . Sexual Activity: Yes    Birth Control/ Protection: None   Other Topics Concern  . Not on file   Social History Narrative   College- [employer]; [employer]-environment science and mgt. Married Derrick [name])  "61" 1 son- 52; 1 dtr "90" 2 grandchildren. 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 End of life-provided discussive context and provided packet.    Family History  Problem Relation Age of Onset  . Arthritis Mother     severe  . Coronary artery disease Mother   . Coronary artery disease Brother   . Cancer Brother     choriocarcinoma  . Colon polyps Mother     Past Medical History  Diagnosis Date  . BENIGN PROSTATIC HYPERTROPHY, WITH OBSTRUCTION 05/28/2010  . COLONIC POLYPS, ADENOMATOUS, HX OF 05/28/2010  . GENERALIZED OSTEOARTHROSIS UNSPECIFIED SITE 05/28/2010  . GERD 05/28/2010  . HYPERLIPIDEMIA 05/28/2010  . Intrinsic asthma, 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  . Malignant neoplasm of descending colon 05/28/2010  . MITRAL VALVE PROLAPSE 05/28/2010  . PULMONARY  SARCOIDOSIS 05/28/2010  . PALPITATIONS, CHRONIC 05/28/2010  . SLEEP APNEA, OBSTRUCTIVE, MODERATE 05/28/2010  . UNSPECIFIED INFLAMMATORY AND TOXIC NEUROPATHY 05/28/2010  . VITAMIN D DEFICIENCY 05/28/2010  . CAD (coronary artery disease)     a. s/p NSTEMI 06/01/11: DES to RCA;  b. cath 06/25/11:   dLM 50-60% (FFR 0.87), prox to mid LAD 40-50%, D1 50%, pCFX 50%, RCA stent ok, dPDA 80%, EF 55-60%.  His FFR was felt to be negative and therefore medical therapy was recommended ;  echo 6/12: EF 55-60%, mild AS   . 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  Coronary artery disease 12/11/2011  . MI (myocardial infarction) 12/11/2011  . Vision loss   . 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 Sarcoidosis     pulmonalis  . Hypertension   . Parotid gland pain     infection  . 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 COPD (chronic obstructive pulmonary disease)   . Diverticulitis   . Colon cancer 2000  . Pneumonia   . Sleep apnea     Past Surgical History  Procedure Laterality Date  . Tonsillectomy  1950  . Colon surgery  2000    decending colon 200  . Lung wedge  2001  . Appendectomy  1954  . Knee surgery  2003  . Heart attack  06/03/11  . Cardiac catheterization  06/03/11    stent  . Cataract extraction      Current Outpatient Prescriptions  Medication Sig Dispense Refill  . albuterol (PROVENTIL HFA) 108 (90 BASE) MCG/ACT inhaler Inhale 2 puffs into the lungs every 6 (six) hours as needed.  18 g  1  . amLODipine (NORVASC) 10 MG tablet TAKE ONE TABLET BY MOUTH EVERY DAY  90 tablet  4  . aspirin 81 MG tablet Take 81 mg by mouth daily.        [name] atorvastatin (LIPITOR) 20 MG tablet TAKE ONE TABLET BY MOUTH ONCE DAILY  30 tablet  0  . bimatoprost (LUMIGAN) 0.01 % SOLN Place 1 drop into both eyes at bedtime.      . budesonide (PULMICORT) 180 MCG/ACT inhaler Inhale 2 puffs into the lungs 2 (two) times daily.  3 each  3  . celecoxib (CELEBREX) 200 MG capsule TAKE  ONE CAPSULE BY MOUTH ONCE DAILY  90 capsule  3  . CHOLECALCIFEROL PO Take 1,000 mg by mouth daily. Vitamin D 3      . clopidogrel (PLAVIX) 75 MG tablet TAKE ONE TABLET BY MOUTH ONCE DAILY  90 tablet  3  . dorzolamide (TRUSOPT) 2 % ophthalmic solution       . finasteride (PROSCAR) 5 MG tablet Take 5 mg by mouth daily.       . fluticasone (FLONASE) 50 MCG/ACT nasal spray INSTILL TWO SPRAY(S) IN EACH NOSTRIL TWICE DAILY  48 g  3  . gabapentin (NEURONTIN) 800 MG tablet TAKE ONE TABLET BY MOUTH FOUR TIMES DAILY  360 tablet  3  . Multiple Vitamin (MULTIVITAMIN) tablet Take 1 tablet by mouth daily.        . nitroGLYCERIN (NITROSTAT) 0.4 MG SL tablet Place 0.4 mg under the tongue every 5 (five) minutes as needed. Pt advised not to take this while on celebrex      . pantoprazole (PROTONIX) 40 MG tablet TAKE ONE TABLET BY MOUTH ONCE DAILY  90 tablet  3  . tadalafil (CIALIS) 5 MG tablet Take 5 mg by mouth daily.      [name] triamterene-hydrochlorothiazide (MAXZIDE-25) 37.5-25 MG per tablet Take 1 tablet by mouth daily.  90 tablet  3   No current facility-administered medications for this visit.    Allergies as of 10/16/2014  . (No Known Allergies)    Vitals: BP 133/69  Pulse 60  Temp(Src) 98.1 F (36.7 C) (Oral)  Resp 12  Ht 5\' 9"  (1.753 m)  Wt 190 lb (86.183 kg)  BMI 28.05 kg/m2 Last Weight:  Wt Readings from Last 1 Encounters:  10/16/14 190 lb (86.183 kg)   Last Height:   Ht Readings from Last 1 Encounters:  10/16/14  5\' 9"  (1.753 m)    Physical exam:  General: The patient is awake, alert and appears not in acute distress. The patient is well groomed. Head: Normocephalic, atraumatic. Neck is supple.  Mallampati 1, neck circumference: No lymph node swelling, no parotid swelling.  Cardiovascular:  Regular rate and rhythm, without  murmurs or carotid bruit, and without distended neck veins. Respiratory: Lungs are clear to auscultation. Skin:  Without evidence of edema, or rash Trunk: BMI  is reduced, the patient lost over 60 pounds over last 24 month, as a result of cardiac rehabilitation. Neurologic exam : The patient is awake and alert, oriented to place and time.   Memory subjective described as intact.  There is a normal attention span & concentration ability.  Speech is fluent without  dysarthria, dysphonia or aphasia. Mood and affect are appropriate.  Cranial nerves: Pupils are equal and briskly reactive to light. Funduscopic exam without  evidence of pallor or edema. Extraocular movements  in vertical and horizontal planes intact and without nystagmus. Visual fields by finger perimetry are intact. Hearing to finger rub intact.  Facial sensation intact to fine touch. Facial motor strength is symmetric and tongue and uvula move midline.  Motor exam:   Normal tone and normal muscle bulk and symmetric normal strength in all extremities. Good grip strength is noted.  He has a foot drop on the right foot, that is progressed since 2013.   Sensory:  Fine touch, pinprick and vibration were tested in all extremities. The patient has hyperesthesia in both planta pedis.   There is a slight decrease of vibratory sense at the ankle level, but the patient reports profound numbness - He also has lost proprioception , more recently this seems to to some degree affect his hands. Coordination: Rapid alternating movements in the fingers/hands is tested and normal.  Finger-to-nose maneuver tested and normal without evidence of ataxia, dysmetria or tremor.  Gait and station: Patient walks without assistive device and is able and assisted stool climb up to the exam table. Strength within normal limits.  Stance is stable and normal. Tandem gait is fragmented, he needs to look where he steps, lost his " sense of where in space " he is . Romberg testing is positive. Deep tendon reflexes: in the  upper and lower extremities are attenuated , he has a mild foot drop in the right - Babinski maneuver  response is downgoing.   Assessment:  Rituximab has reduced the patient's arthritic symptoms and has certainly beneficial effects on ankylosing spondylitis, the MG [place] may well be associated with these autoimmune disorders at baseline. His neuropathy also did not progress during the rituximab treatment. His last treatment was given in April  2014 and he is now on a " drug holiday" for almost one year .  He continues to be monitored by his hematologist-oncologist Derrick. Murriel Hopper / Standley Dakins, MD He has  ankylosing spondylitis and neuropathy will likely progress if he is not treated with a specific immune modulation. His hyperpathia, hyperesthesias and dyseasthesias have increased.   As to his obstructive sleep apnea, there is no need for any adjustment at this time. His current settings are comfortable and has residual AHI is very low. In office download confirmed high compliance 02-13-14 .   He is extremely intolerant to cold temperature, and has anhydrosis - this is more often seen in dysautonomia with NP.  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 [name].   I would like to follow  the patient once a year for the CPAP exclusively for sleep - RV in 12 month . The  compliance report for 10-16-14 reveals a AHI of  1.2 and daily user time of  9 hours and 14 minutes, and 100% over 4 hour compliance over 4 hours of nightly use.   I refilled Neurontin for his neuropathic symptoms. He has spinal stenosis - 4 times daily 400 mg neurontin. .   A revisit will be scheduled in 12 months.  Labs , CBC and Diff by oncologist. Cold agglutinin ordered.  Plasma -Electrophoresis reordered. [name]

## 2014-10-16 NOTE — Patient Instructions (Signed)
Chronic Inflammatory Demyelinating Polyneuropathy Chronic inflammatory demyelinating polyneuropathy (CIDP) is a condition in which damage to nerves results in impaired nerve function. This may cause diminished or unusual sensations, abnormal muscle function, or both. CAUSES  CIDP is caused when the covering of nerves (myelin sheath) is damaged. In the most common form of the disorder, the damage is believed to occur because the immune system (cells that protect the body against foreign invaders, like bacteria and viruses) accidentally attacks the myelin. CIDP may accompany conditions such as blood disorders (Waldenstrom's macroglobulinemia, multiple myeloma, or osteosclerotic myeloma), diabetes, HIV, or hepatitis B or C infection. SYMPTOMS   Numbness or tingling of the hands and feet.  Weakness.  Problems walking.  Weak or absent reflexes. DIAGNOSIS  A physical exam will be done by your health care provider. Also, tests can be done to show how well your nerves are working. These tests include:   Nerve conduction studies.  Electromyography. A nerve biopsy may be done to remove a small piece of a nerve so it can be examined. TREATMENT  Treatment may include:  Steroids or other immunosuppressant drugs. These drugs can decrease inflammation of the nerves.  Immunoglobulin therapy. This decreases your immune system's activity against the nerves.  Blood plasma exchange. This can be done to remove harmful immune system elements from your blood.  Physical therapy. This may help improve symptoms.  Braces or orthotics. These may help support weakened limbs. HOME CARE INSTRUCTIONS   Take all your medicines as directed by your health care provider.  Notify your health care provider if have significant side effects of the drugs. Never stop the medicines without a discussion with your health care provider.  Follow through on physical therapy recommendations. SEEK MEDICAL CARE IF:  You  notice new symptoms.  You notice your symptoms worsening.   Document Released: 09/04/2002 Document Revised: 08/15/2013 Document Reviewed: 06/28/2013 [facility] Patient Information 2015 [place], [place]. This information is not intended to replace advice given to you by your health care provider. Make sure you discuss any questions you have with your health care provider. Peripheral Neuropathy Peripheral neuropathy is a type of nerve damage. It affects nerves that carry signals between the spinal cord and other parts of the body. These are called peripheral nerves. With peripheral neuropathy, one nerve or a group of nerves may be damaged.  CAUSES  Many things can damage peripheral nerves. For some people with peripheral neuropathy, the cause is unknown. Some causes include:  Diabetes. This is the most common cause of peripheral neuropathy.  Injury to a nerve.  Pressure or stress on a nerve that lasts a long time.  Too little vitamin B. Alcoholism can lead to this.  Infections.  Autoimmune diseases, such as multiple sclerosis and systemic lupus erythematosus.  Inherited nerve diseases.  Some medicines, such as cancer drugs.  Toxic substances, such as lead and mercury.  Too little blood flowing to the legs.  Kidney disease.  Thyroid disease. SIGNS AND SYMPTOMS  Different people have different symptoms. The symptoms you have will depend on which of your nerves is damaged. Common symptoms include:  Loss of feeling (numbness) in the feet and hands.  Tingling in the feet and hands.  Pain that burns.  Very sensitive skin.  Weakness.  Not being able to move a part of the body (paralysis).  Muscle twitching.  Clumsiness or poor coordination.  Loss of balance.  Not being able to control your bladder.  Feeling dizzy.  Sexual problems. DIAGNOSIS  Peripheral neuropathy is a symptom, not a disease. Finding the cause of peripheral neuropathy can be hard. To figure that  out, your health care provider will take a medical history and do a physical exam. A neurological exam will also be done. This involves checking things affected by your brain, spinal cord, and nerves (nervous system). For example, your health care provider will check your reflexes, how you move, and what you can feel.  Other types of tests may also be ordered, such as:  Blood tests.  A test of the fluid in your spinal cord.  Imaging tests, such as CT scans or an MRI.  Electromyography (EMG). This test checks the nerves that control muscles.  Nerve conduction velocity tests. These tests check how fast messages pass through your nerves.  Nerve biopsy. A small piece of nerve is removed. It is then checked under a microscope. TREATMENT   Medicine is often used to treat peripheral neuropathy. Medicines may include:  Pain-relieving medicines. Prescription or over-the-counter medicine may be suggested.  Antiseizure medicine. This may be used for pain.  Antidepressants. These also may help ease pain from neuropathy.  Lidocaine. This is a numbing medicine. You might wear a patch or be given a shot.  Mexiletine. This medicine is typically used to help control irregular heart rhythms.  Surgery. Surgery may be needed to relieve pressure on a nerve or to destroy a nerve that is causing pain.  Physical therapy to help movement.  Assistive devices to help movement. HOME CARE INSTRUCTIONS   Only take over-the-counter or prescription medicines as directed by your health care provider. Follow the instructions carefully for any given medicines. Do not take any other medicines without first getting approval from your health care provider.  If you have diabetes, work closely with your health care provider to keep your blood sugar under control.  If you have numbness in your feet:  Check every day for signs of injury or infection. Watch for redness, warmth, and swelling.  Wear padded socks and  comfortable shoes. These help protect your feet.  Do not do things that put pressure on your damaged nerve.  Do not smoke. Smoking keeps blood from getting to damaged nerves.  Avoid or limit alcohol. Too much alcohol can cause a lack of B vitamins. These vitamins are needed for healthy nerves.  Develop a good support system. Coping with peripheral neuropathy can be stressful. Talk to a mental health specialist or join a support group if you are struggling.  Follow up with your health care provider as directed. SEEK MEDICAL CARE IF:   You have new signs or symptoms of peripheral neuropathy.  You are struggling emotionally from dealing with peripheral neuropathy.  You have a fever. SEEK IMMEDIATE MEDICAL CARE IF:   You have an injury or infection that is not healing.  You feel very dizzy or begin vomiting.  You have chest pain.  You have trouble breathing. Document Released: 12/03/2002 Document Revised: 08/25/2011 Document Reviewed: 08/20/2013 [facility] Patient Information 2015 [place], [place]. This information is not intended to replace advice given to you by your health care provider. Make sure you discuss any questions you have with your health care provider.

## 2014-10-17 ENCOUNTER — Telehealth: Payer: Self-pay | Admitting: Hematology and Oncology

## 2014-10-17 NOTE — Telephone Encounter (Signed)
returned pt call and r/s appt to 11.4 per pt request

## 2014-10-18 ENCOUNTER — Ambulatory Visit (INDEPENDENT_AMBULATORY_CARE_PROVIDER_SITE_OTHER): Payer: Medicare Other | Admitting: Cardiovascular Disease

## 2014-10-18 ENCOUNTER — Ambulatory Visit (HOSPITAL_COMMUNITY): Payer: Medicare Other | Attending: Cardiology | Admitting: Cardiology

## 2014-10-18 ENCOUNTER — Encounter: Payer: Self-pay | Admitting: Cardiovascular Disease

## 2014-10-18 VITALS — BP 138/74 | HR 52 | Ht 69.0 in | Wt 189.5 lb

## 2014-10-18 DIAGNOSIS — I10 Essential (primary) hypertension: Secondary | ICD-10-CM

## 2014-10-18 DIAGNOSIS — I2510 Atherosclerotic heart disease of native coronary artery without angina pectoris: Secondary | ICD-10-CM | POA: Diagnosis not present

## 2014-10-18 DIAGNOSIS — Z951 Presence of aortocoronary bypass graft: Secondary | ICD-10-CM | POA: Insufficient documentation

## 2014-10-18 DIAGNOSIS — I6523 Occlusion and stenosis of bilateral carotid arteries: Secondary | ICD-10-CM | POA: Diagnosis not present

## 2014-10-18 DIAGNOSIS — I739 Peripheral vascular disease, unspecified: Secondary | ICD-10-CM | POA: Insufficient documentation

## 2014-10-18 DIAGNOSIS — E785 Hyperlipidemia, unspecified: Secondary | ICD-10-CM | POA: Diagnosis not present

## 2014-10-18 MED ORDER — PANTOPRAZOLE SODIUM 40 MG PO TBEC: DELAYED_RELEASE_TABLET | ORAL | Status: DC

## 2014-10-18 MED ORDER — CLOPIDOGREL BISULFATE 75 MG PO TABS: ORAL_TABLET | ORAL | Status: DC

## 2014-10-18 MED ORDER — ATORVASTATIN CALCIUM 20 MG PO TABS: ORAL_TABLET | ORAL | Status: DC

## 2014-10-18 NOTE — Progress Notes (Signed)
Carotid duplex performed 

## 2014-10-18 NOTE — Progress Notes (Signed)
Background: The patient is followed for coronary artery disease. He has a history of myocardial infarction and drug-eluting stent placement in the right coronary artery. 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 He's been managed medically. He has multiple noncardiac problems including CIDP, pulmonary sarcoid, and monoclonal gammopathy of undetermined significance. Despite his problems, he functions at a very high level.   HPI:  73 year old gentleman presenting for followup evaluation. He's doing very well from a cardiac perspective. He still has occasional pains in his upper neck and back, but is now attributed this to musculoskeletal discomfort. His pain pattern is not related to physical exertion or walking for exercise. He walks about 2 miles per day at a brisk pace and has no symptoms with that level of activity. He denies any substernal chest pain or pressure. He has no shortness of breath, edema, or heart palpitations.  Studies:  Myoview scan 01/28/2014: Impression  Exercise Capacity: Excellent exercise capacity.  BP Response: BP dropped at peak exercise below baseline. Recovered in recovery period.  Clinical Symptoms: No chest pain.  ECG Impression: See above Electrically negative.  Comparison with Prior Nuclear Study: No previous nuclear study performed  Overall Impression: Inferior/inferolateral thinning consistent with soft tissue attenuation and a small region of ischemia in the distal inferolateral, distal inferior walls. Bowel activity interferes with rest imaging, making interpretation difficult  LV Ejection Fraction: 64%. LV Wall Motion: NL LV Function; NL Wall Motion  Lipid Panel 12/31/2013    Component Value Date/Time   CHOL 154 12/31/2013 1556   TRIG 52.0 12/31/2013 1556   HDL 56.90 12/31/2013 1556   CHOLHDL 3 12/31/2013 1556   VLDL 10.4 12/31/2013 1556   LDLCALC 87 12/31/2013 1556      Outpatient Encounter  Prescriptions as of 10/18/2014  Medication Sig  . albuterol (PROVENTIL HFA) 108 (90 BASE) MCG/ACT inhaler Inhale 2 puffs into the lungs every 6 (six) hours as needed.  [name] amLODipine (NORVASC) 10 MG tablet TAKE ONE TABLET BY MOUTH EVERY DAY  . aspirin 81 MG tablet Take 81 mg by mouth daily.    [name] atorvastatin (LIPITOR) 20 MG tablet TAKE ONE TABLET BY MOUTH ONCE DAILY  . bimatoprost (LUMIGAN) 0.01 % SOLN Place 1 drop into both eyes at bedtime.  . budesonide (PULMICORT) 180 MCG/ACT inhaler Inhale 2 puffs into the lungs 2 (two) times daily.  . celecoxib (CELEBREX) 200 MG capsule TAKE ONE CAPSULE BY MOUTH ONCE DAILY  . CHOLECALCIFEROL PO Take 1,000 mg by mouth daily. Vitamin D 3  . clopidogrel (PLAVIX) 75 MG tablet TAKE ONE TABLET BY MOUTH ONCE DAILY  . dorzolamide (TRUSOPT) 2 % ophthalmic solution   . finasteride (PROSCAR) 5 MG tablet Take 5 mg by mouth daily.   . fluticasone (FLONASE) 50 MCG/ACT nasal spray INSTILL TWO SPRAY(S) IN EACH NOSTRIL TWICE DAILY  . gabapentin (NEURONTIN) 800 MG tablet TAKE ONE TABLET BY MOUTH FOUR TIMES DAILY  . Multiple Vitamin (MULTIVITAMIN) tablet Take 1 tablet by mouth daily.    . nitroGLYCERIN (NITROSTAT) 0.4 MG SL tablet Place 0.4 mg under the tongue every 5 (five) minutes as needed. Pt advised not to take this while on celebrex  . pantoprazole (PROTONIX) 40 MG tablet TAKE ONE TABLET BY MOUTH ONCE DAILY  . tadalafil (CIALIS) 5 MG tablet Take 5 mg by mouth daily.  [name] triamterene-hydrochlorothiazide (MAXZIDE-25) 37.5-25 MG per tablet Take 1 tablet by mouth daily.    No Known Allergies  Past Medical History  Diagnosis Date  . BENIGN PROSTATIC HYPERTROPHY, WITH OBSTRUCTION 05/28/2010  . COLONIC POLYPS, ADENOMATOUS, HX OF 05/28/2010  . GENERALIZED OSTEOARTHROSIS UNSPECIFIED SITE 05/28/2010  . GERD 05/28/2010  . HYPERLIPIDEMIA 05/28/2010  . Intrinsic asthma, 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  . Malignant neoplasm of descending colon 05/28/2010  . MITRAL VALVE PROLAPSE 05/28/2010  .  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 PALPITATIONS, CHRONIC 05/28/2010  . SLEEP APNEA, OBSTRUCTIVE, MODERATE 05/28/2010  . UNSPECIFIED INFLAMMATORY AND TOXIC NEUROPATHY 05/28/2010  . VITAMIN D DEFICIENCY 05/28/2010  . CAD (coronary artery disease)     a. s/p NSTEMI 06/01/11: DES to RCA;  b. cath 06/25/11:   dLM 50-60% (FFR 0.87), prox to mid LAD 40-50%, D1 50%, pCFX 50%, RCA stent ok, dPDA 80%, EF 55-60%.  His FFR was felt to be negative and therefore medical therapy was recommended ;  echo 6/12: EF 55-60%, mild AS   . 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 Coronary artery disease 12/11/2011  . MI (myocardial infarction) 12/11/2011  . Vision loss   . 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 Sarcoidosis     pulmonalis  . Hypertension   . Parotid gland pain     infection  . 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 COPD (chronic obstructive pulmonary disease)   . Diverticulitis   . Colon cancer 2000  . Pneumonia   . Sleep apnea   . 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   ROS: Negative except as per HPI  BP 138/74  Pulse 52  Ht 5\' 9"  (1.753 m)  Wt 189 lb 8 oz (85.957 kg)  BMI 27.97 kg/m2  PHYSICAL EXAM: Pt is alert and oriented, NAD HEENT: normal Neck: JVP - normal, carotids 2+= without bruits Lungs: CTA bilaterally CV: RRR without murmur or gallop Abd: soft, NT, Positive BS, no hepatomegaly Ext: no C/C/E, distal pulses intact and equal Skin: warm/dry no rash  EKG:  Sinus bradycardia with first degree AV block, within normal limits  ASSESSMENT AND PLAN: 1. Coronary artery disease, native vessel, without symptoms of angina. The patient is doing quite well had a good activity level with no cardiac related symptoms. Considering his moderate left mainstem disease, I would like to continue to  follow him with yearly functional studies. We'll schedule him for a non-imaging exercise treadmill study in the spring. I will see him back in one year.  2. Essential hypertension. Blood pressure is well controlled on amlodipine and triamterene/hydrochlorothiazide. Bradycardia precludes use of a beta blocker.  3. Hyperlipidemia. The patient continues on atorvastatin. Most recent lipids were reviewed as above.  The patient had a carotid duplex followup ultrasound today. He has minimal plaque bilaterally, less than 40%. No followup is indicated. As above, he will have an exercise treadmill test in about 6 months for followup visit in one year. No medication changes were made today. [name] 10/18/2014 12:01 PM

## 2014-10-18 NOTE — Patient Instructions (Signed)
Your physician has requested that you have an exercise tolerance test in MARCH 2016 with PA/NP. For further information please visit [email or web address]. Please also follow instruction sheet, as given.  Your physician wants you to follow-up in: 1 YEAR with Dr Burt Knack. You will receive a reminder letter in the mail two months in advance. If you don't receive a letter, please call our office to schedule the follow-up appointment.  Your physician recommends that you continue on your current medications as directed. Please refer to the Current Medication list given to you today.

## 2014-10-28 ENCOUNTER — Other Ambulatory Visit (HOSPITAL_BASED_OUTPATIENT_CLINIC_OR_DEPARTMENT_OTHER): Payer: Medicare Other

## 2014-10-28 DIAGNOSIS — C186 Malignant neoplasm of descending colon: Secondary | ICD-10-CM | POA: Diagnosis not present

## 2014-10-28 DIAGNOSIS — D472 Monoclonal gammopathy: Secondary | ICD-10-CM | POA: Diagnosis not present

## 2014-10-28 DIAGNOSIS — D869 Sarcoidosis, unspecified: Secondary | ICD-10-CM | POA: Diagnosis not present

## 2014-10-28 LAB — COMPREHENSIVE METABOLIC PANEL (CC13)
ALT: 22 U/L (ref 0–55)
AST: 23 U/L (ref 5–34)
Albumin: 4.4 g/dL (ref 3.5–5.0)
Alkaline Phosphatase: 55 U/L (ref 40–150)
Anion Gap: 9 mEq/L (ref 3–11)
BUN: 16.9 mg/dL (ref 7.0–26.0)
CO2: 26 mEq/L (ref 22–29)
Calcium: 10.2 mg/dL (ref 8.4–10.4)
Chloride: 105 mEq/L (ref 98–109)
Creatinine: 0.9 mg/dL (ref 0.7–1.3)
Glucose: 111 mg/dl (ref 70–140)
Potassium: 3.6 mEq/L (ref 3.5–5.1)
Sodium: 140 mEq/L (ref 136–145)
Total Bilirubin: 0.82 mg/dL (ref 0.20–1.20)
Total Protein: 7 g/dL (ref 6.4–8.3)

## 2014-10-28 LAB — CBC WITH DIFFERENTIAL/PLATELET
BASO%: 0.5 % (ref 0.0–2.0)
Basophils Absolute: 0 10*3/uL (ref 0.0–0.1)
EOS%: 5.7 % (ref 0.0–7.0)
Eosinophils Absolute: 0.2 10*3/uL (ref 0.0–0.5)
HCT: 40.6 % (ref 38.4–49.9)
HGB: 14 g/dL (ref 13.0–17.1)
LYMPH%: 26.4 % (ref 14.0–49.0)
MCH: 30.8 pg (ref 27.2–33.4)
MCHC: 34.5 g/dL (ref 32.0–36.0)
MCV: 89.4 fL (ref 79.3–98.0)
MONO#: 0.5 10*3/uL (ref 0.1–0.9)
MONO%: 13 % (ref 0.0–14.0)
NEUT#: 2 10*3/uL (ref 1.5–6.5)
NEUT%: 54.4 % (ref 39.0–75.0)
Platelets: 153 10*3/uL (ref 140–400)
RBC: 4.54 10*6/uL (ref 4.20–5.82)
RDW: 12.8 % (ref 11.0–14.6)
WBC: 3.7 10*3/uL — ABNORMAL LOW (ref 4.0–10.3)
lymph#: 1 10*3/uL (ref 0.9–3.3)

## 2014-10-30 ENCOUNTER — Other Ambulatory Visit: Payer: Medicare Other

## 2014-10-30 LAB — SPEP & IFE WITH QIG
Albumin ELP: 64.2 % (ref 55.8–66.1)
Alpha-1-Globulin: 3.8 % (ref 2.9–4.9)
Alpha-2-Globulin: 9 % (ref 7.1–11.8)
Beta 2: 3.3 % (ref 3.2–6.5)
Beta Globulin: 6.1 % (ref 4.7–7.2)
Gamma Globulin: 13.6 % (ref 11.1–18.8)
IgA: 154 mg/dL (ref 68–379)
IgG (Immunoglobin G), Serum: 708 mg/dL (ref 650–1600)
IgM, Serum: 282 mg/dL — ABNORMAL HIGH (ref 41–251)
M-Spike, %: 0.25 g/dL
Total Protein, Serum Electrophoresis: 6.9 g/dL (ref 6.0–8.3)

## 2014-10-30 LAB — BETA 2 MICROGLOBULIN, SERUM: Beta-2 Microglobulin: 1.74 mg/L (ref <=2.51)

## 2014-10-30 LAB — KAPPA/LAMBDA LIGHT CHAINS
Kappa free light chain: 1.76 mg/dL (ref 0.33–1.94)
Kappa:Lambda Ratio: 1.59 (ref 0.26–1.65)
Lambda Free Lght Chn: 1.11 mg/dL (ref 0.57–2.63)

## 2014-11-04 ENCOUNTER — Ambulatory Visit (HOSPITAL_BASED_OUTPATIENT_CLINIC_OR_DEPARTMENT_OTHER): Payer: Medicare Other | Admitting: Hematology and Oncology

## 2014-11-04 ENCOUNTER — Telehealth: Payer: Self-pay | Admitting: Hematology and Oncology

## 2014-11-04 ENCOUNTER — Encounter: Payer: Self-pay | Admitting: Hematology and Oncology

## 2014-11-04 VITALS — BP 128/62 | HR 54 | Temp 99.6°F | Resp 18 | Ht 69.0 in | Wt 191.9 lb

## 2014-11-04 DIAGNOSIS — G6181 Chronic inflammatory demyelinating polyneuritis: Secondary | ICD-10-CM

## 2014-11-04 DIAGNOSIS — D472 Monoclonal gammopathy: Secondary | ICD-10-CM

## 2014-11-04 DIAGNOSIS — D72819 Decreased white blood cell count, unspecified: Secondary | ICD-10-CM | POA: Diagnosis not present

## 2014-11-04 NOTE — Assessment & Plan Note (Signed)
This is mild and he is not symptomatic. Recommend observation only.

## 2014-11-04 NOTE — Telephone Encounter (Signed)
Pt confirmed labs/ov per 11/09 POF, gave pt AVS ..... KJ

## 2014-11-04 NOTE — Progress Notes (Signed)
[facility] OFF[facility] PROGRESS NOTE  Patient Care Team: Janith Lima, MD as PCP - General (Internal Medicine) Heath Lark, MD as Consulting Physician (Hematology and Oncology) CHIEF COMPLAINTS/PURPOSE OF VISIT:  History of CIDP and IgM monoclonal gammopathy, no evidence of active disease  HISTORY OF PRESENTING ILLNESS:  Derrick [name] was transferred to my care after his prior physician has left.  I reviewed the patient's records extensive and collaborated the history with the patient. 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 He was initially evaluated and treated by a hematologist and a neurologist in [place] before he moved to [place]. 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 He had bone marrow aspirate and biopsy which showed no evidence of malignancy in his bone marrow. His neurologic condition is characterized primarily by painful distal neuropathy, feet worse than hands, and lower extremity weakness. At one point he could barely walk a block without having to stop due to pain and weakness. He was given an initial trial of Rituxan in 2010 with no significant improvement. However following reinitiation of Rituxan in April 2012 he did get a progressive improvement. He was put on a maintenance program which we have continued in this office on an every other month basis through 03/23/2013.   Since he was last seen here, his complained of persistent numbness and pain at the lower extremities. He denies any problems with fine motor skills of his hands. He denies any new neurological deficit. The patient also has diagnosis of sarcoidosis but he denies any cough, chest pain or shortness breath. He also had a remote history of colon cancer, resected, without the need for  adjuvant treatment. He is due for another screening colonoscopy. He denies any change in bowel habits. Denies any melena or hematochezia or unexplained weight loss. He complained of sensation of coldness all the time.  REVIEW OF SYSTEMS:   Constitutional: Denies fevers, chills or abnormal weight loss Eyes: Denies blurriness of vision Ears, nose, mouth, throat, and face: Denies mucositis or sore throat Respiratory: Denies cough, dyspnea or wheezes Cardiovascular: Denies palpitation, chest discomfort or lower extremity swelling Gastrointestinal:  Denies nausea, heartburn or change in bowel habits Skin: Denies abnormal skin rashes Lymphatics: Denies new lymphadenopathy or easy bruising Neurological:Denies numbness, tingling or new weaknesses Behavioral/Psych: Mood is stable, no new changes  All other systems were reviewed with the patient and are negative.  I have reviewed the past medical history, past surgical history, social history and family history with the patient and they are unchanged from previous note.  ALLERGIES:  has No Known Allergies.  MEDICATIONS:  Current Outpatient Prescriptions  Medication Sig Dispense Refill  . albuterol (PROVENTIL HFA) 108 (90 BASE) MCG/ACT inhaler Inhale 2 puffs into the lungs every 6 (six) hours as needed. 18 g 1  . amLODipine (NORVASC) 10 MG tablet TAKE ONE TABLET BY MOUTH EVERY DAY 90 tablet 4  . aspirin 81 MG tablet Take 81 mg by mouth daily.      [name] atorvastatin (LIPITOR) 20 MG tablet TAKE ONE TABLET BY MOUTH ONCE DAILY 90 tablet 3  . bimatoprost (LUMIGAN) 0.01 % SOLN Place 1 drop into both eyes at bedtime.    . budesonide (PULMICORT) 180 MCG/ACT inhaler Inhale 2 puffs into the lungs 2 (two) times daily. 3 each 3  .  celecoxib (CELEBREX) 200 MG capsule TAKE ONE CAPSULE BY MOUTH ONCE DAILY 90 capsule 3  . CHOLECALCIFEROL PO Take 1,000 mg by mouth daily. Vitamin D 3    . clopidogrel (PLAVIX) 75 MG tablet TAKE ONE TABLET BY MOUTH ONCE DAILY 90 tablet  3  . dorzolamide (TRUSOPT) 2 % ophthalmic solution     . finasteride (PROSCAR) 5 MG tablet Take 5 mg by mouth daily.     . fluticasone (FLONASE) 50 MCG/ACT nasal spray INSTILL TWO SPRAY(S) IN EACH NOSTRIL TWICE DAILY 48 g 3  . gabapentin (NEURONTIN) 800 MG tablet TAKE ONE TABLET BY MOUTH FOUR TIMES DAILY 360 tablet 3  . Multiple Vitamin (MULTIVITAMIN) tablet Take 1 tablet by mouth daily.      . nitroGLYCERIN (NITROSTAT) 0.4 MG SL tablet Place 0.4 mg under the tongue every 5 (five) minutes as needed. Pt advised not to take this while on celebrex    . pantoprazole (PROTONIX) 40 MG tablet TAKE ONE TABLET BY MOUTH ONCE DAILY 90 tablet 3  . tadalafil (CIALIS) 5 MG tablet Take 5 mg by mouth daily.    [name] triamterene-hydrochlorothiazide (MAXZIDE-25) 37.5-25 MG per tablet Take 1 tablet by mouth daily. 90 tablet 3   No current facility-administered medications for this visit.    PHYSICAL EXAMINATION: ECOG PERFORMANCE STATUS: 1 - Symptomatic but completely ambulatory  Filed Vitals:   11/04/14 1326  BP: 128/62  Pulse: 54  Temp: 99.6 F (37.6 C)  Resp: 18   Filed Weights   11/04/14 1326  Weight: 191 lb 14.4 oz (87.045 kg)    GENERAL:alert, no distress and comfortable SKIN: skin color, texture, turgor are normal, no rashes or significant lesions EYES: normal, Conjunctiva are pink and non-injected, sclera clear Musculoskeletal:no cyanosis of digits and no clubbing  NEURO: alert & oriented x 3 with fluent speech, no focal motor/sensory deficits  LABORATORY DATA:  I have reviewed the data as listed    Component Value Date/Time   NA 140 10/28/2014 0820   NA 140 12/31/2013 1556   K 3.6 10/28/2014 0820   K 4.0 12/31/2013 1556   CL 102 12/31/2013 1556   CL 104 03/23/2013 0855   CO2 26 10/28/2014 0820   CO2 33* 12/31/2013 1556   GLUCOSE 111 10/28/2014 0820   GLUCOSE 76 12/31/2013 1556   GLUCOSE 98 03/23/2013 0855   BUN 16.9 10/28/2014 0820   BUN 19 12/31/2013 1556   CREATININE 0.9  10/28/2014 0820   CREATININE 1.0 12/31/2013 1556   CREATININE 0.87 07/30/2011 0733   CALCIUM 10.2 10/28/2014 0820   CALCIUM 10.1 12/31/2013 1556   PROT 7.0 10/28/2014 0820   PROT 7.1 12/31/2013 1556   ALBUMIN 4.4 10/28/2014 0820   ALBUMIN 4.7 12/31/2013 1556   AST 23 10/28/2014 0820   AST 22 12/31/2013 1556   ALT 22 10/28/2014 0820   ALT 20 12/31/2013 1556   ALKPHOS 55 10/28/2014 0820   ALKPHOS 43 12/31/2013 1556   BILITOT 0.82 10/28/2014 0820   BILITOT 0.8 12/31/2013 1556   GFRNONAA >60 06/05/2011 0350   GFRAA >60 06/05/2011 0350    No results found for: SPEP, UPEP  Lab Results  Component Value Date   WBC 3.7* 10/28/2014   NEUTROABS 2.0 10/28/2014   HGB 14.0 10/28/2014   HCT 40.6 10/28/2014   MCV 89.4 10/28/2014   PLT 153 10/28/2014      Chemistry      Component Value Date/Time   NA 140 10/28/2014 0820   NA 140 12/31/2013  1556   K 3.6 10/28/2014 0820   K 4.0 12/31/2013 1556   CL 102 12/31/2013 1556   CL 104 03/23/2013 0855   CO2 26 10/28/2014 0820   CO2 33* 12/31/2013 1556   BUN 16.9 10/28/2014 0820   BUN 19 12/31/2013 1556   CREATININE 0.9 10/28/2014 0820   CREATININE 1.0 12/31/2013 1556   CREATININE 0.87 07/30/2011 0733      Component Value Date/Time   CALCIUM 10.2 10/28/2014 0820   CALCIUM 10.1 12/31/2013 1556   ALKPHOS 55 10/28/2014 0820   ALKPHOS 43 12/31/2013 1556   AST 23 10/28/2014 0820   AST 22 12/31/2013 1556   ALT 22 10/28/2014 0820   ALT 20 12/31/2013 1556   BILITOT 0.82 10/28/2014 0820   BILITOT 0.8 12/31/2013 1556       ASSESSMENT & PLAN:  MGUS (monoclonal gammopathy of unknown significance) IgM level is a little high but M spike is stable. He is not symptomatic. I recommend observation only with history, physical examination and blood work in 6 months.  CIDP (chronic inflammatory demyelinating polyneuropathy) This is stable. Recommend observation only. There is no indication to treat with rituximab.  Leukopenia This is mild and  he is not symptomatic. Recommend observation only.   Orders Placed This Encounter  Procedures  . CBC with Differential    Standing Status: Future     Number of Occurrences:      Standing Expiration Date: 12/09/2015  . Comprehensive metabolic panel    Standing Status: Future     Number of Occurrences:      Standing Expiration Date: 12/09/2015  . Lactate dehydrogenase    Standing Status: Future     Number of Occurrences:      Standing Expiration Date: 12/09/2015  . SPEP & IFE with QIG    Standing Status: Future     Number of Occurrences:      Standing Expiration Date: 12/09/2015  . Kappa/lambda light chains    Standing Status: Future     Number of Occurrences:      Standing Expiration Date: 12/09/2015  . Beta 2 microglobulin, serum    Standing Status: Future     Number of Occurrences:      Standing Expiration Date: 12/09/2015   All questions were answered. The patient knows to call the clinic with any problems, questions or concerns. No barriers to learning was detected. I spent 15 minutes counseling the patient face to face. The total time spent in the appointment was 20 minutes and more than 50% was on counseling and review of test results     [facility], [place], MD 11/04/2014 2:02 PM

## 2014-11-04 NOTE — Assessment & Plan Note (Signed)
IgM level is a little high but M spike is stable. He is not symptomatic. I recommend observation only with history, physical examination and blood work in 6 months.

## 2014-11-04 NOTE — Assessment & Plan Note (Signed)
This is stable. Recommend observation only. There is no indication to treat with rituximab.

## 2014-11-05 ENCOUNTER — Telehealth: Payer: Self-pay | Admitting: *Deleted

## 2014-11-05 NOTE — Telephone Encounter (Signed)
I called and spoke to pt and relayed the result of the lab (cold agglutinin titer is negative).   He verbalized understanding.

## 2014-11-14 DIAGNOSIS — R310 Gross hematuria: Secondary | ICD-10-CM | POA: Diagnosis not present

## 2014-12-11 ENCOUNTER — Telehealth: Payer: Self-pay | Admitting: Internal Medicine

## 2014-12-11 MED ORDER — TRIAMTERENE-HCTZ 37.5-25 MG PO TABS: 1.0000 | ORAL_TABLET | Freq: Every day | ORAL | Status: DC

## 2014-12-11 NOTE — Telephone Encounter (Signed)
Pt called to request refill for triamterene-hydrochlorothiazide to be send to [facility]. Please advise, pt is about to be out of this med.

## 2014-12-11 NOTE — Telephone Encounter (Signed)
done

## 2014-12-31 ENCOUNTER — Telehealth: Payer: Self-pay | Admitting: Neurology

## 2014-12-31 DIAGNOSIS — D472 Monoclonal gammopathy: Secondary | ICD-10-CM

## 2014-12-31 DIAGNOSIS — D869 Sarcoidosis, unspecified: Secondary | ICD-10-CM

## 2014-12-31 DIAGNOSIS — L744 Anhidrosis: Secondary | ICD-10-CM

## 2014-12-31 DIAGNOSIS — M48061 Spinal stenosis, lumbar region without neurogenic claudication: Secondary | ICD-10-CM

## 2014-12-31 MED ORDER — GABAPENTIN 800 MG PO TABS: ORAL_TABLET | ORAL | Status: DC

## 2014-12-31 NOTE — Telephone Encounter (Signed)
Patient requesting for 90 day supply of gabapentin (NEURONTIN) 800 MG tablet forwarded to [facility] on [address].  Please call and advise.

## 2014-12-31 NOTE — Telephone Encounter (Signed)
Rx previously prescribed at [facility] in Feb.  Rx has been sent.  I caleld the patient back.  Got no answer.  Left message.

## 2015-01-01 ENCOUNTER — Other Ambulatory Visit: Payer: Self-pay | Admitting: *Deleted

## 2015-01-01 DIAGNOSIS — D472 Monoclonal gammopathy: Secondary | ICD-10-CM

## 2015-01-01 DIAGNOSIS — M48061 Spinal stenosis, lumbar region without neurogenic claudication: Secondary | ICD-10-CM

## 2015-01-01 DIAGNOSIS — D869 Sarcoidosis, unspecified: Secondary | ICD-10-CM

## 2015-01-01 DIAGNOSIS — L744 Anhidrosis: Secondary | ICD-10-CM

## 2015-01-01 MED ORDER — GABAPENTIN 800 MG PO TABS: ORAL_TABLET | ORAL | Status: DC

## 2015-01-07 ENCOUNTER — Telehealth: Payer: Self-pay | Admitting: *Deleted

## 2015-01-07 NOTE — Telephone Encounter (Signed)
Instructed pt that his brother would need referral from MD to see Dr. Alvy Bimler and also needs evaluated by Neurologist first.  He reports he has already seen Neuro at [facility].  Pt says he has strong family history of neuropathy and thinks his brother needs his immunoglobulins tested.   Suggested that he ask his neurologist to test this if they haven't already but Dr. Alvy Bimler is willing to see pt's brother if he gets a referral.  He verbalized understanding.

## 2015-01-07 NOTE — Telephone Encounter (Signed)
-----   Message from Heath Lark, MD sent at 01/07/2015  7:42 AM EST ----- Please tell him we need an MD referral and neurology consult first. They all go through referral ([facility]) ----- Message -----    From: Patton Salles, RN    Sent: 01/06/2015   4:23 PM      To: Heath Lark, MD  Pt left a message- states his brother has same type of neuropathy as patient. Would like to get him in to see you....Marland KitchenMarland Kitchen

## 2015-01-09 ENCOUNTER — Ambulatory Visit: Payer: Medicare Other | Admitting: Neurology

## 2015-01-20 ENCOUNTER — Encounter: Payer: Self-pay | Admitting: Internal Medicine

## 2015-02-06 ENCOUNTER — Other Ambulatory Visit (INDEPENDENT_AMBULATORY_CARE_PROVIDER_SITE_OTHER): Payer: Medicare Other

## 2015-02-06 ENCOUNTER — Encounter: Payer: Self-pay | Admitting: Internal Medicine

## 2015-02-06 ENCOUNTER — Ambulatory Visit (INDEPENDENT_AMBULATORY_CARE_PROVIDER_SITE_OTHER): Payer: Medicare Other | Admitting: Internal Medicine

## 2015-02-06 VITALS — BP 128/70 | HR 63 | Temp 98.3°F | Resp 16 | Ht 69.0 in | Wt 193.0 lb

## 2015-02-06 DIAGNOSIS — D72819 Decreased white blood cell count, unspecified: Secondary | ICD-10-CM

## 2015-02-06 DIAGNOSIS — E785 Hyperlipidemia, unspecified: Secondary | ICD-10-CM | POA: Diagnosis not present

## 2015-02-06 DIAGNOSIS — N401 Enlarged prostate with lower urinary tract symptoms: Secondary | ICD-10-CM

## 2015-02-06 DIAGNOSIS — I10 Essential (primary) hypertension: Secondary | ICD-10-CM

## 2015-02-06 DIAGNOSIS — Z0000 Encounter for general adult medical examination without abnormal findings: Secondary | ICD-10-CM

## 2015-02-06 DIAGNOSIS — I2510 Atherosclerotic heart disease of native coronary artery without angina pectoris: Secondary | ICD-10-CM | POA: Diagnosis not present

## 2015-02-06 DIAGNOSIS — R351 Nocturia: Secondary | ICD-10-CM

## 2015-02-06 LAB — COMPREHENSIVE METABOLIC PANEL
ALT: 19 U/L (ref 0–53)
AST: 19 U/L (ref 0–37)
Albumin: 4.7 g/dL (ref 3.5–5.2)
Alkaline Phosphatase: 57 U/L (ref 39–117)
BUN: 20 mg/dL (ref 6–23)
CO2: 31 mEq/L (ref 19–32)
Calcium: 10.1 mg/dL (ref 8.4–10.5)
Chloride: 102 mEq/L (ref 96–112)
Creatinine, Ser: 0.97 mg/dL (ref 0.40–1.50)
GFR: 80.89 mL/min (ref >60.00)
Glucose, Bld: 94 mg/dL (ref 70–99)
Potassium: 4 mEq/L (ref 3.5–5.1)
Sodium: 140 mEq/L (ref 135–145)
Total Bilirubin: 0.7 mg/dL (ref 0.2–1.2)
Total Protein: 7.3 g/dL (ref 6.0–8.3)

## 2015-02-06 LAB — CBC WITH DIFFERENTIAL/PLATELET
Basophils Absolute: 0 10*3/uL (ref 0.0–0.1)
Basophils Relative: 0.3 % (ref 0.0–3.0)
Eosinophils Absolute: 0.3 10*3/uL (ref 0.0–0.7)
Eosinophils Relative: 4.8 % (ref 0.0–5.0)
HCT: 42.1 % (ref 39.0–52.0)
Hemoglobin: 14.7 g/dL (ref 13.0–17.0)
Lymphocytes Relative: 9.1 % — ABNORMAL LOW (ref 12.0–46.0)
Lymphs Abs: 0.6 10*3/uL — ABNORMAL LOW (ref 0.7–4.0)
MCHC: 34.9 g/dL (ref 30.0–36.0)
MCV: 89.2 fl (ref 78.0–100.0)
Monocytes Absolute: 0.4 10*3/uL (ref 0.1–1.0)
Monocytes Relative: 6.2 % (ref 3.0–12.0)
Neutro Abs: 5.7 10*3/uL (ref 1.4–7.7)
Neutrophils Relative %: 79.6 % — ABNORMAL HIGH (ref 43.0–77.0)
Platelets: 202 10*3/uL (ref 150.0–400.0)
RBC: 4.72 Mil/uL (ref 4.22–5.81)
RDW: 13 % (ref 11.5–15.5)
WBC: 7.2 10*3/uL (ref 4.0–10.5)

## 2015-02-06 LAB — FECAL OCCULT BLOOD, GUAIAC: Fecal Occult Blood: NEGATIVE

## 2015-02-06 LAB — URINALYSIS, ROUTINE W REFLEX MICROSCOPIC
Bilirubin Urine: NEGATIVE
Hgb urine dipstick: NEGATIVE
Ketones, ur: NEGATIVE
Leukocytes, UA: NEGATIVE
Nitrite: NEGATIVE
RBC / HPF: NONE SEEN (ref 0–?)
Specific Gravity, Urine: 1.015 (ref 1.000–1.030)
Total Protein, Urine: NEGATIVE
Urine Glucose: NEGATIVE
Urobilinogen, UA: 0.2 (ref 0.0–1.0)
WBC, UA: NONE SEEN (ref 0–?)
pH: 8 (ref 5.0–8.0)

## 2015-02-06 LAB — PSA: PSA: 3.42 ng/mL (ref 0.10–4.00)

## 2015-02-06 LAB — LIPID PANEL
Cholesterol: 171 mg/dL (ref 0–200)
HDL: 62.8 mg/dL (ref >39.00)
LDL Cholesterol: 96 mg/dL (ref 0–99)
NonHDL: 108.2
Total CHOL/HDL Ratio: 3
Triglycerides: 62 mg/dL (ref 0.0–149.0)
VLDL: 12.4 mg/dL (ref 0.0–40.0)

## 2015-02-06 LAB — TSH: TSH: 2.46 u[IU]/mL (ref 0.35–4.50)

## 2015-02-06 NOTE — Progress Notes (Signed)
Subjective:    Patient ID: Derrick [name], male    DOB: 1941-03-29, 74 y.o.   MRN: [id number]  Hypertension This is a chronic problem. The current episode started more than 1 year ago. The problem has been gradually improving since onset. The problem is controlled. Pertinent negatives include no anxiety, blurred vision, chest pain, headaches, malaise/fatigue, neck pain, orthopnea, palpitations, peripheral edema, PND, shortness of breath or sweats. There are no associated agents to hypertension. Past treatments include diuretics. The current treatment provides significant improvement. There are no compliance problems.       Review of Systems  Constitutional: Negative.  Negative for fever, chills, malaise/fatigue, diaphoresis, appetite change and fatigue.  HENT: Negative.   Eyes: Negative.  Negative for blurred vision.  Respiratory: Negative.  Negative for cough, choking, chest tightness, shortness of breath and stridor.   Cardiovascular: Negative.  Negative for chest pain, palpitations, orthopnea, leg swelling and PND.  Gastrointestinal: Negative.  Negative for nausea, vomiting, abdominal pain, diarrhea, constipation and blood in stool.  Endocrine: Negative.   Genitourinary: Negative.   Musculoskeletal: Negative.  Negative for neck pain.  Skin: Negative.  Negative for rash.  Allergic/Immunologic: Negative.   Neurological: Negative.  Negative for dizziness, tremors, seizures, syncope, facial asymmetry, speech difficulty, weakness, light-headedness, numbness and headaches.  Hematological: Negative.  Negative for adenopathy. Does not bruise/bleed easily.  Psychiatric/Behavioral: Negative.        Objective:   Physical Exam  Constitutional: He is oriented to person, place, and time. He appears well-developed and well-nourished. No distress.  HENT:  Head: Normocephalic and atraumatic.  Mouth/Throat: Oropharynx is clear and moist. No oropharyngeal exudate.  Eyes: Conjunctivae are normal.  Right eye exhibits no discharge. Left eye exhibits no discharge. No scleral icterus.  Neck: Normal range of motion. Neck supple. No JVD present. No tracheal deviation present. No thyromegaly present.  Cardiovascular: Normal rate, regular rhythm, normal heart sounds and intact distal pulses.  Exam reveals no gallop and no friction rub.   No murmur heard. Pulmonary/Chest: Effort normal and breath sounds normal. No stridor. No respiratory distress. He has no wheezes. He has no rales. He exhibits no tenderness.  Abdominal: Soft. Bowel sounds are normal. He exhibits no distension and no mass. There is no tenderness. There is no rebound and no guarding. Hernia confirmed negative in the right inguinal area and confirmed negative in the left inguinal area.  Genitourinary: Rectum normal, testes normal and penis normal. Rectal exam shows no external hemorrhoid, no internal hemorrhoid, no fissure, no mass, no tenderness and anal tone normal. Guaiac negative stool. Prostate is enlarged (1+ smooth symm BPH). Prostate is not tender. Right testis shows no mass, no swelling and no tenderness. Right testis is descended. Left testis shows no mass, no swelling and no tenderness. Left testis is descended. Circumcised. No penile erythema or penile tenderness. No discharge found.  Musculoskeletal: Normal range of motion. He exhibits no edema or tenderness.  Lymphadenopathy:    He has no cervical adenopathy.       Right: No inguinal adenopathy present.       Left: No inguinal adenopathy present.  Neurological: He is oriented to person, place, and time.  Skin: Skin is warm and dry. No rash noted. He is not diaphoretic. No erythema. No pallor.  Psychiatric: He has a normal mood and affect. His behavior is normal. Judgment and thought content normal.  Nursing note and vitals reviewed.   Lab Results  Component Value Date   WBC 7.2 02/06/2015  HGB 14.7 02/06/2015   HCT 42.1 02/06/2015   PLT 202.0 02/06/2015   GLUCOSE  94 02/06/2015   CHOL 171 02/06/2015   TRIG 62.0 02/06/2015   HDL 62.80 02/06/2015   LDLCALC 96 02/06/2015   ALT 19 02/06/2015   AST 19 02/06/2015   NA 140 02/06/2015   K 4.0 02/06/2015   CL 102 02/06/2015   CREATININE 0.97 02/06/2015   BUN 20 02/06/2015   CO2 31 02/06/2015   TSH 2.46 02/06/2015   PSA 3.42 02/06/2015   INR 1.2* 06/22/2011        Assessment & Plan:

## 2015-02-06 NOTE — Patient Instructions (Signed)
Health Maintenance A healthy lifestyle and preventative care can promote health and wellness.  Maintain regular health, dental, and eye exams.  Eat a healthy diet. Foods like vegetables, fruits, whole grains, low-fat dairy products, and lean protein foods contain the nutrients you need and are low in calories. Decrease your intake of foods high in solid fats, added sugars, and salt. Get information about a proper diet from your health care provider, if necessary.  Regular physical exercise is one of the most important things you can do for your health. Most adults should get at least 150 minutes of moderate-intensity exercise (any activity that increases your heart rate and causes you to sweat) each week. In addition, most adults need muscle-strengthening exercises on 2 or more days a week.   Maintain a healthy weight. The body mass index (BMI) is a screening tool to identify possible weight problems. It provides an estimate of body fat based on height and weight. Your health care provider can find your BMI and can help you achieve or maintain a healthy weight. For males 20 years and older:  A BMI below 18.5 is considered underweight.  A BMI of 18.5 to 24.9 is normal.  A BMI of 25 to 29.9 is considered overweight.  A BMI of 30 and above is considered obese.  Maintain normal blood lipids and cholesterol by exercising and minimizing your intake of saturated fat. Eat a balanced diet with plenty of fruits and vegetables. Blood tests for lipids and cholesterol should begin at age 20 and be repeated every 5 years. If your lipid or cholesterol levels are high, you are over age 50, or you are at high risk for heart disease, you may need your cholesterol levels checked more frequently.Ongoing high lipid and cholesterol levels should be treated with medicines if diet and exercise are not working.  If you smoke, find out from your health care provider how to quit. If you do not use tobacco, do not  start.  Lung cancer screening is recommended for adults aged 55-80 years who are at high risk for developing lung cancer because of a history of smoking. A yearly low-dose CT scan of the lungs is recommended for people who have at least a 30-pack-year history of smoking and are current smokers or have quit within the past 15 years. A pack year of smoking is smoking an average of 1 pack of cigarettes a day for 1 year (for example, a 30-pack-year history of smoking could mean smoking 1 pack a day for 30 years or 2 packs a day for 15 years). Yearly screening should continue until the smoker has stopped smoking for at least 15 years. Yearly screening should be stopped for people who develop a health problem that would prevent them from having lung cancer treatment.  If you choose to drink alcohol, do not have more than 2 drinks per day. One drink is considered to be 12 oz (360 mL) of beer, 5 oz (150 mL) of wine, or 1.5 oz (45 mL) of liquor.  Avoid the use of street drugs. Do not share needles with anyone. Ask for help if you need support or instructions about stopping the use of drugs.  High blood pressure causes heart disease and increases the risk of stroke. Blood pressure should be checked at least every 1-2 years. Ongoing high blood pressure should be treated with medicines if weight loss and exercise are not effective.  If you are 45-79 years old, ask your health care provider if   you should take aspirin to prevent heart disease.  Diabetes screening involves taking a blood sample to check your fasting blood sugar level. This should be done once every 3 years after age 45 if you are at a normal weight and without risk factors for diabetes. Testing should be considered at a younger age or be carried out more frequently if you are overweight and have at least 1 risk factor for diabetes.  Colorectal cancer can be detected and often prevented. Most routine colorectal cancer screening begins at the age of 50  and continues through age 75. However, your health care provider may recommend screening at an earlier age if you have risk factors for colon cancer. On a yearly basis, your health care provider may provide home test kits to check for hidden blood in the stool. A small camera at the end of a tube may be used to directly examine the colon (sigmoidoscopy or colonoscopy) to detect the earliest forms of colorectal cancer. Talk to your health care provider about this at age 50 when routine screening begins. A direct exam of the colon should be repeated every 5-10 years through age 75, unless early forms of precancerous polyps or small growths are found.  People who are at an increased risk for hepatitis B should be screened for this virus. You are considered at high risk for hepatitis B if:  You were born in a country where hepatitis B occurs often. Talk with your health care provider about which countries are considered high risk.  Your parents were born in a high-risk country and you have not received a shot to protect against hepatitis B (hepatitis B vaccine).  You have HIV or AIDS.  You use needles to inject street drugs.  You live with, or have sex with, someone who has hepatitis B.  You are a man who has sex with other men (MSM).  You get hemodialysis treatment.  You take certain medicines for conditions like cancer, organ transplantation, and autoimmune conditions.  Hepatitis C blood testing is recommended for all people born from 1945 through 1965 and any individual with known risk factors for hepatitis C.  Healthy men should no longer receive prostate-specific antigen (PSA) blood tests as part of routine cancer screening. Talk to your health care provider about prostate cancer screening.  Testicular cancer screening is not recommended for adolescents or adult males who have no symptoms. Screening includes self-exam, a health care provider exam, and other screening tests. Consult with your  health care provider about any symptoms you have or any concerns you have about testicular cancer.  Practice safe sex. Use condoms and avoid high-risk sexual practices to reduce the spread of sexually transmitted infections (STIs).  You should be screened for STIs, including gonorrhea and chlamydia if:  You are sexually active and are younger than 24 years.  You are older than 24 years, and your health care provider tells you that you are at risk for this type of infection.  Your sexual activity has changed since you were last screened, and you are at an increased risk for chlamydia or gonorrhea. Ask your health care provider if you are at risk.  If you are at risk of being infected with HIV, it is recommended that you take a prescription medicine daily to prevent HIV infection. This is called pre-exposure prophylaxis (PrEP). You are considered at risk if:  You are a man who has sex with other men (MSM).  You are a heterosexual man who   is sexually active with multiple partners.  You take drugs by injection.  You are sexually active with a partner who has HIV.  Talk with your health care provider about whether you are at high risk of being infected with HIV. If you choose to begin PrEP, you should first be tested for HIV. You should then be tested every 3 months for as long as you are taking PrEP.  Use sunscreen. Apply sunscreen liberally and repeatedly throughout the day. You should seek shade when your shadow is shorter than you. Protect yourself by wearing long sleeves, pants, a wide-brimmed hat, and sunglasses year round whenever you are outdoors.  Tell your health care provider of new moles or changes in moles, especially if there is a change in shape or color. Also, tell your health care provider if a mole is larger than the size of a pencil eraser.  A one-time screening for abdominal aortic aneurysm (AAA) and surgical repair of large AAAs by ultrasound is recommended for men aged  65-75 years who are current or former smokers.  Stay current with your vaccines (immunizations). Document Released: 06/10/2008 Document Revised: 12/18/2013 Document Reviewed: 05/10/2011 ExitCare Patient Information 2015 [employer]. This information is not intended to replace advice given to you by your health care provider. Make sure you discuss any questions you have with your health care provider.  Hypertension Hypertension, commonly called high blood pressure, is when the force of blood pumping through your arteries is too strong. Your arteries are the blood vessels that carry blood from your heart throughout your body. A blood pressure reading consists of a higher number over a lower number, such as 110/72. The higher number (systolic) is the pressure inside your arteries when your heart pumps. The lower number (diastolic) is the pressure inside your arteries when your heart relaxes. Ideally you want your blood pressure below 120/80. Hypertension forces your heart to work harder to pump blood. Your arteries may become narrow or stiff. Having hypertension puts you at risk for heart disease, stroke, and other problems.  RISK FACTORS Some risk factors for high blood pressure are controllable. Others are not.  Risk factors you cannot control include:   Race. You may be at higher risk if you are African American.  Age. Risk increases with age.  Gender. Men are at higher risk than women before age 45 years. After age 65, women are at higher risk than men. Risk factors you can control include:  Not getting enough exercise or physical activity.  Being overweight.  Getting too much fat, sugar, calories, or salt in your diet.  Drinking too much alcohol. SIGNS AND SYMPTOMS Hypertension does not usually cause signs or symptoms. Extremely high blood pressure (hypertensive crisis) may cause headache, anxiety, shortness of breath, and nosebleed. DIAGNOSIS  To check if you have hypertension,  your health care provider will measure your blood pressure while you are seated, with your arm held at the level of your heart. It should be measured at least twice using the same arm. Certain conditions can cause a difference in blood pressure between your right and left arms. A blood pressure reading that is higher than normal on one occasion does not mean that you need treatment. If one blood pressure reading is high, ask your health care provider about having it checked again. TREATMENT  Treating high blood pressure includes making lifestyle changes and possibly taking medicine. Living a healthy lifestyle can help lower high blood pressure. You may need to change   some of your habits. Lifestyle changes may include:  Following the DASH diet. This diet is high in fruits, vegetables, and whole grains. It is low in salt, red meat, and added sugars.  Getting at least 2 hours of brisk physical activity every week.  Losing weight if necessary.  Not smoking.  Limiting alcoholic beverages.  Learning ways to reduce stress. If lifestyle changes are not enough to get your blood pressure under control, your health care provider may prescribe medicine. You may need to take more than one. Work closely with your health care provider to understand the risks and benefits. HOME CARE INSTRUCTIONS  Have your blood pressure rechecked as directed by your health care provider.   Take medicines only as directed by your health care provider. Follow the directions carefully. Blood pressure medicines must be taken as prescribed. The medicine does not work as well when you skip doses. Skipping doses also puts you at risk for problems.   Do not smoke.   Monitor your blood pressure at home as directed by your health care provider. SEEK MEDICAL CARE IF:   You think you are having a reaction to medicines taken.  You have recurrent headaches or feel dizzy.  You have swelling in your ankles.  You have  trouble with your vision. SEEK IMMEDIATE MEDICAL CARE IF:  You develop a severe headache or confusion.  You have unusual weakness, numbness, or feel faint.  You have severe chest or abdominal pain.  You vomit repeatedly.  You have trouble breathing. MAKE SURE YOU:   Understand these instructions.  Will watch your condition.  Will get help right away if you are not doing well or get worse. Document Released: 12/13/2005 Document Revised: 04/29/2014 Document Reviewed: 10/05/2013 ExitCare Patient Information 2015 [employer]. This information is not intended to replace advice given to you by your health care provider. Make sure you discuss any questions you have with your health care provider.  

## 2015-02-06 NOTE — Progress Notes (Signed)
Pre visit review using our clinic review tool, if applicable. No additional management support is needed unless otherwise documented below in the visit note. 

## 2015-02-07 ENCOUNTER — Encounter: Payer: Medicare Other | Admitting: Internal Medicine

## 2015-02-07 DIAGNOSIS — H4011X2 Primary open-angle glaucoma, moderate stage: Secondary | ICD-10-CM | POA: Diagnosis not present

## 2015-02-10 ENCOUNTER — Ambulatory Visit: Payer: Medicare Other | Admitting: Neurology

## 2015-02-10 NOTE — Assessment & Plan Note (Signed)

## 2015-02-10 NOTE — Assessment & Plan Note (Signed)
His BP is well controlled Lytes and renal function are stable 

## 2015-02-10 NOTE — Assessment & Plan Note (Signed)
His symptoms are well controlled with cialis Exam and PSA today are WNL - no evidence of cancer

## 2015-02-14 ENCOUNTER — Other Ambulatory Visit: Payer: Self-pay | Admitting: *Deleted

## 2015-02-14 DIAGNOSIS — D472 Monoclonal gammopathy: Secondary | ICD-10-CM

## 2015-02-14 DIAGNOSIS — D869 Sarcoidosis, unspecified: Secondary | ICD-10-CM

## 2015-02-14 DIAGNOSIS — M48061 Spinal stenosis, lumbar region without neurogenic claudication: Secondary | ICD-10-CM

## 2015-02-14 DIAGNOSIS — L744 Anhidrosis: Secondary | ICD-10-CM

## 2015-02-14 MED ORDER — FLUTICASONE PROPIONATE 50 MCG/ACT NA SUSP: 2.0000 | Freq: Every day | NASAL | Status: DC

## 2015-02-14 NOTE — Telephone Encounter (Signed)
Pt requesting refill for fluticasone (FLONASE) 50 MCG/ACT nasal spray [[id number]]   [facility] on [address]

## 2015-02-14 NOTE — Telephone Encounter (Signed)
Pt requesting refill on his flonase...Derrick [name]

## 2015-02-25 ENCOUNTER — Other Ambulatory Visit: Payer: Self-pay | Admitting: Internal Medicine

## 2015-03-21 ENCOUNTER — Encounter: Payer: Self-pay | Admitting: *Deleted

## 2015-03-21 ENCOUNTER — Ambulatory Visit (INDEPENDENT_AMBULATORY_CARE_PROVIDER_SITE_OTHER): Payer: Medicare Other | Admitting: Physician Assistant

## 2015-03-21 DIAGNOSIS — I10 Essential (primary) hypertension: Secondary | ICD-10-CM | POA: Diagnosis not present

## 2015-03-21 DIAGNOSIS — E785 Hyperlipidemia, unspecified: Secondary | ICD-10-CM

## 2015-03-21 DIAGNOSIS — R9439 Abnormal result of other cardiovascular function study: Secondary | ICD-10-CM

## 2015-03-21 DIAGNOSIS — I2510 Atherosclerotic heart disease of native coronary artery without angina pectoris: Secondary | ICD-10-CM

## 2015-03-21 NOTE — Progress Notes (Signed)
Exercise Treadmill Test  Pre-Exercise Testing Evaluation Rhythm: sinus bradycardia  Rate: 53 bpm     Test  Exercise Tolerance Test Ordering MD: [name], MD  Interpreting MD: Richardson Dopp, PA-C  Unique Test No: 1  Treadmill:  1  Indication for ETT: known ASHD  Contraindication to ETT: No   Stress Modality: exercise - treadmill  Cardiac Imaging Performed: non   Protocol: standard Bruce - maximal  Max BP:  172/75  Max MPHR (bpm):  149 85% MPR (bpm):  127  MPHR obtained (bpm):  139 % MPHR obtained:  93  Reached 85% MPHR (min:sec):  7:21 Total Exercise Time (min-sec):  9:00  Workload in METS:  10.1 Borg Scale: 17  Reason ETT Terminated:  desired heart rate attained    ST Segment Analysis At Rest: normal ST segments - no evidence of significant ST depression With Exercise: significant ischemic ST depression  Other Information Arrhythmia:  Yes Angina during ETT:  present (1) Quality of ETT:  diagnostic  ETT Interpretation:  abnormal - evidence of ST depression consistent with ischemia  Comments: Good exercise capacity. He complained of "lung burning" at peak exercise.   Normal BP response to exercise. There was significant ST depression at peak exercise. This was present in recovery. 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  Occasional Mobitz 1 noted.  This was all in pre-exercise.  Recommendations: I reviewed his ETT findings with Dr. [name] by telephone.  Given his hx of mod non-obstructive LM disease, we feel it is best to proceed with cardiac cath.  Risks and benefits of cardiac catheterization have been discussed with the patient.  These include bleeding, infection, kidney damage, stroke, heart attack, death.  The patient understands these risks and is willing to proceed.  This will be set up with Dr. [name]. Signed,  Richardson Dopp, PA-C   03/21/2015 10:07 AM

## 2015-03-21 NOTE — H&P (Signed)
History and Physical   Background: The patient is followed for coronary artery disease. He has a history of myocardial infarction and drug-eluting stent placement in the right coronary artery. 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 He's been managed medically. He has multiple noncardiac problems including CIDP, pulmonary sarcoid, and monoclonal gammopathy of undetermined significance. Despite his problems, he functions at a very high level.   HPI:  74 year old gentleman presenting for followup evaluation. He's doing very well from a cardiac perspective. He still has occasional pains in his upper neck and back, but is now attributed this to musculoskeletal discomfort. His pain pattern is not related to physical exertion or walking for exercise. He walks about 2 miles per day at a brisk pace and has no symptoms with that level of activity. He denies any substernal chest pain or pressure. He has no shortness of breath, edema, or heart palpitations.  Studies:   Myoview scan 01/28/2014: Impression  Exercise Capacity: Excellent exercise capacity.   BP Response: BP dropped at peak exercise below baseline. Recovered in recovery period.   Clinical Symptoms: No chest pain.   ECG Impression: See above Electrically negative.   Comparison with Prior Nuclear Study: No previous nuclear study performed   Overall Impression: Inferior/inferolateral thinning consistent with soft tissue attenuation and a small region of ischemia in the distal inferolateral, distal inferior walls. Bowel activity interferes with rest imaging, making interpretation difficult   LV Ejection Fraction: 64%. LV Wall Motion: NL LV Function; NL Wall Motion  Lipid Panel 12/31/2013  Labs (Brief)        Component  Value  Date/Time     CHOL  154  12/31/2013 1556     TRIG  52.0  12/31/2013 1556     HDL  56.90  12/31/2013 1556     CHOLHDL  3  12/31/2013 1556     VLDL  10.4   12/31/2013 1556     LDLCALC  87  12/31/2013 1556           Outpatient Encounter Prescriptions as of 10/18/2014   Medication  Sig   .  albuterol (PROVENTIL HFA) 108 (90 BASE) MCG/ACT inhaler  Inhale 2 puffs into the lungs every 6 (six) hours as needed.   [name]  amLODipine (NORVASC) 10 MG tablet  TAKE ONE TABLET BY MOUTH EVERY DAY   .  aspirin 81 MG tablet  Take 81 mg by mouth daily.     [name]  atorvastatin (LIPITOR) 20 MG tablet  TAKE ONE TABLET BY MOUTH ONCE DAILY   .  bimatoprost (LUMIGAN) 0.01 % SOLN  Place 1 drop into both eyes at bedtime.   .  budesonide (PULMICORT) 180 MCG/ACT inhaler  Inhale 2 puffs into the lungs 2 (two) times daily.   .  celecoxib (CELEBREX) 200 MG capsule  TAKE ONE CAPSULE BY MOUTH ONCE DAILY   .  CHOLECALCIFEROL PO  Take 1,000 mg by mouth daily. Vitamin D 3   .  clopidogrel (PLAVIX) 75 MG tablet  TAKE ONE TABLET BY MOUTH ONCE DAILY   .  dorzolamide (TRUSOPT) 2 % ophthalmic solution     .  finasteride (PROSCAR) 5 MG tablet  Take 5 mg by mouth daily.    .  fluticasone (FLONASE) 50 MCG/ACT nasal spray  INSTILL TWO SPRAY(S) IN EACH NOSTRIL TWICE DAILY   .  gabapentin (NEURONTIN) 800 MG tablet  TAKE ONE TABLET BY MOUTH FOUR TIMES DAILY   .  Multiple Vitamin (MULTIVITAMIN) tablet  Take 1 tablet by mouth daily.     .  nitroGLYCERIN (NITROSTAT) 0.4 MG SL tablet  Place 0.4 mg under the tongue every 5 (five) minutes as needed. Pt advised not to take this while on celebrex   .  pantoprazole (PROTONIX) 40 MG tablet  TAKE ONE TABLET BY MOUTH ONCE DAILY   .  tadalafil (CIALIS) 5 MG tablet  Take 5 mg by mouth daily.   [name]  triamterene-hydrochlorothiazide (MAXZIDE-25) 37.5-25 MG per tablet  Take 1 tablet by mouth daily.     No Known Allergies    Past Medical History   Diagnosis  Date   .  BENIGN PROSTATIC HYPERTROPHY, WITH OBSTRUCTION  05/28/2010   .  COLONIC POLYPS, ADENOMATOUS, HX OF  05/28/2010   .  GENERALIZED OSTEOARTHROSIS UNSPECIFIED SITE  05/28/2010   .  GERD  05/28/2010   .   HYPERLIPIDEMIA  05/28/2010   .  Intrinsic asthma, unspecified  05/28/2010   .  Malignant neoplasm of descending colon  05/28/2010   .  MITRAL VALVE PROLAPSE  05/28/2010   .  PULMONARY SARCOIDOSIS  05/28/2010   .  PALPITATIONS, CHRONIC  05/28/2010   .  SLEEP APNEA, OBSTRUCTIVE, MODERATE  05/28/2010   .  UNSPECIFIED INFLAMMATORY AND TOXIC NEUROPATHY  05/28/2010   .  VITAMIN D DEFICIENCY  05/28/2010   .  CAD (coronary artery disease)         a. s/p NSTEMI 06/01/11: DES to RCA;  b. cath 06/25/11:   dLM 50-60% (FFR 0.87), prox to mid LAD 40-50%, D1 50%, pCFX 50%, RCA stent ok, dPDA 80%, EF 55-60%.  His FFR was felt to be negative and therefore medical therapy was recommended ;  echo 6/12: EF 55-60%, mild AS    .  Monoclonal gammopathy of undetermined significance  12/11/2011   .  Coronary artery disease  12/11/2011   .  MI (myocardial infarction)  12/11/2011   .  Vision loss     .  CIDP (chronic inflammatory demyelinating polyneuropathy)  04/11/2012   .  Sarcoidosis         pulmonalis   .  Hypertension     .  Parotid gland pain         infection   .  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  COPD (chronic obstructive pulmonary disease)     .  Diverticulitis     .  Colon cancer  2000   .  Pneumonia     .  Sleep apnea     .  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   ROS: Negative except as per HPI  BP 138/74  Pulse 52  Ht 5\' 9"  (1.753 m)  Wt 189 lb 8 oz (85.957 kg)  BMI 27.97 kg/m2  PHYSICAL EXAM: Pt is alert and oriented, NAD HEENT: normal Neck: JVP - normal, carotids 2+= without bruits Lungs: CTA bilaterally CV: RRR without murmur or gallop Abd: soft, NT, Positive BS, no hepatomegaly Ext: no C/C/E, distal pulses intact and equal Skin: warm/dry no rash  EKG:  Sinus bradycardia with first degree AV block, within normal limits  ASSESSMENT AND  PLAN: 1. Coronary artery disease, native vessel, without symptoms of angina. The patient is doing quite well had a  good activity level with no cardiac related symptoms. Considering his moderate left mainstem disease, I would like to continue to follow him with yearly functional studies. We'll schedule him for a non-imaging exercise treadmill study in the spring. I will see him back in one year.  2. Essential hypertension. Blood pressure is well controlled on amlodipine and triamterene/hydrochlorothiazide. Bradycardia precludes use of a beta blocker.  3. Hyperlipidemia. The patient continues on atorvastatin. Most recent lipids were reviewed as above.  The patient had a carotid duplex followup ultrasound today. He has minimal plaque bilaterally, less than 40%. No followup is indicated. As above, he will have an exercise treadmill test in about 6 months for followup visit in one year. No medication changes were made today. [name] 10/18/2014 12:01 PM   ADDENDUM 03/21/2015 2:36 PM  Patient presented to the office today for his exercise stress test.  His history, ROS and exam are unchanged since 10/18/14.   Exercise Treadmill Test  Pre-Exercise Testing Evaluation Rhythm: sinus bradycardia  Rate: 53 bpm     Test  Exercise Tolerance Test Ordering MD: [name], MD  Interpreting MD: Richardson Dopp, PA-C  Unique Test No: 1  Treadmill:  1  Indication for ETT: known ASHD  Contraindication to ETT: No   Stress Modality: exercise - treadmill  Cardiac Imaging Performed: non   Protocol: standard Bruce - maximal  Max BP:  172/75  Max MPHR (bpm):  149 85% MPR (bpm):  127  MPHR obtained (bpm):  139 % MPHR obtained:  93  Reached 85% MPHR (min:sec):  7:21 Total Exercise Time (min-sec):  9:00  Workload in METS:  10.1 Borg Scale: 17  Reason ETT Terminated:  desired heart rate attained    ST Segment Analysis At Rest: normal ST segments - no evidence of significant ST depression With  Exercise: significant ischemic ST depression  Other Information Arrhythmia:  Yes Angina during ETT:  present (1) Quality of ETT:  diagnostic  ETT Interpretation:  abnormal - evidence of ST depression consistent with ischemia  Comments: Good exercise capacity. He complained of "lung burning" at peak exercise.   Normal BP response to exercise. There was significant ST depression at peak exercise. This was present in recovery. 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  Occasional Mobitz 1 noted.  This was all in pre-exercise.  Recommendations: I reviewed his ETT findings with Dr. [name] by telephone.  Given his hx of mod non-obstructive LM disease, we feel it is best to proceed with cardiac cath.  Risks and benefits of cardiac catheterization have been discussed with the patient.  These include bleeding, infection, kidney damage, stroke, heart attack, death.  The patient understands these risks and is willing to proceed.  This will be set up with Dr. [name].  Signed,  Versie Starks, MHS 03/21/2015 2:37 PM    [address], [place], [place]  [address] Phone: [phone]; Fax: [phone]   ADDENDUM 03/25/2015: ETT reviewed with Richardson Dopp, PA-C. He has known moderate left main disease. With abnormal exercise tolerance test, I've recommended cardiac catheterization. EKG changes more pronounced than last year's study.  [name] 03/23/2015 11:06 PM

## 2015-03-21 NOTE — Patient Instructions (Signed)
PER DR. Burt Knack AND SCOTT WEAVER, PAC PT TO HAVE LEFT HEART CATH ; PT SCHEDULED WITH DR. Burt Knack FOR CATH 9 AM ON 03/31/15  PRE CATH LAB WORK TO BE DONE ON 03/26/15  WENT OVER CATH INSTRUCTIONS WITH PT BY PHONE AND WILL MAIL TO PT AS WELL.

## 2015-03-26 ENCOUNTER — Ambulatory Visit (INDEPENDENT_AMBULATORY_CARE_PROVIDER_SITE_OTHER): Payer: Medicare Other | Admitting: Neurology

## 2015-03-26 ENCOUNTER — Encounter: Payer: Self-pay | Admitting: Neurology

## 2015-03-26 ENCOUNTER — Other Ambulatory Visit (INDEPENDENT_AMBULATORY_CARE_PROVIDER_SITE_OTHER): Payer: Medicare Other | Admitting: *Deleted

## 2015-03-26 VITALS — BP 135/79 | HR 47 | Resp 12 | Ht 70.0 in | Wt 198.4 lb

## 2015-03-26 DIAGNOSIS — M5134 Other intervertebral disc degeneration, thoracic region: Secondary | ICD-10-CM

## 2015-03-26 DIAGNOSIS — I2510 Atherosclerotic heart disease of native coronary artery without angina pectoris: Secondary | ICD-10-CM

## 2015-03-26 DIAGNOSIS — D472 Monoclonal gammopathy: Secondary | ICD-10-CM

## 2015-03-26 DIAGNOSIS — G4733 Obstructive sleep apnea (adult) (pediatric): Secondary | ICD-10-CM

## 2015-03-26 DIAGNOSIS — I10 Essential (primary) hypertension: Secondary | ICD-10-CM | POA: Diagnosis not present

## 2015-03-26 DIAGNOSIS — G63 Polyneuropathy in diseases classified elsewhere: Secondary | ICD-10-CM | POA: Diagnosis not present

## 2015-03-26 DIAGNOSIS — Z9989 Dependence on other enabling machines and devices: Principal | ICD-10-CM

## 2015-03-26 DIAGNOSIS — I25810 Atherosclerosis of coronary artery bypass graft(s) without angina pectoris: Secondary | ICD-10-CM | POA: Insufficient documentation

## 2015-03-26 LAB — CBC WITH DIFFERENTIAL/PLATELET
Basophils Absolute: 0 10*3/uL (ref 0.0–0.1)
Basophils Relative: 0.5 % (ref 0.0–3.0)
Eosinophils Absolute: 0.2 10*3/uL (ref 0.0–0.7)
Eosinophils Relative: 5.7 % — ABNORMAL HIGH (ref 0.0–5.0)
HCT: 41.4 % (ref 39.0–52.0)
Hemoglobin: 14.1 g/dL (ref 13.0–17.0)
Lymphocytes Relative: 24.5 % (ref 12.0–46.0)
Lymphs Abs: 1 10*3/uL (ref 0.7–4.0)
MCHC: 34 g/dL (ref 30.0–36.0)
MCV: 91.5 fl (ref 78.0–100.0)
Monocytes Absolute: 0.5 10*3/uL (ref 0.1–1.0)
Monocytes Relative: 11.1 % (ref 3.0–12.0)
Neutro Abs: 2.4 10*3/uL (ref 1.4–7.7)
Neutrophils Relative %: 58.2 % (ref 43.0–77.0)
Platelets: 163 10*3/uL (ref 150.0–400.0)
RBC: 4.52 Mil/uL (ref 4.22–5.81)
RDW: 13.1 % (ref 11.5–15.5)
WBC: 4.1 10*3/uL (ref 4.0–10.5)

## 2015-03-26 LAB — BASIC METABOLIC PANEL
BUN: 20 mg/dL (ref 6–23)
CO2: 33 mEq/L — ABNORMAL HIGH (ref 19–32)
Calcium: 10.6 mg/dL — ABNORMAL HIGH (ref 8.4–10.5)
Chloride: 103 mEq/L (ref 96–112)
Creatinine, Ser: 0.99 mg/dL (ref 0.40–1.50)
GFR: 78.98 mL/min (ref >60.00)
Glucose, Bld: 100 mg/dL — ABNORMAL HIGH (ref 70–99)
Potassium: 3.9 mEq/L (ref 3.5–5.1)
Sodium: 139 mEq/L (ref 135–145)

## 2015-03-26 LAB — PROTIME-INR
INR: 1.1 ratio — ABNORMAL HIGH (ref 0.8–1.0)
Prothrombin Time: 12.2 s (ref 9.6–13.1)

## 2015-03-26 NOTE — Progress Notes (Signed)
[facility]  Provider:  Larey Seat, M D  Referring Provider: Janith Lima, MD Primary Care Physician:  [name], MD  Chief Complaint  Patient presents with  . RV cpap    Rm 10, alone   MGUS neuropathy, ankylsing spondylitis and OSA/ sarcoidosis.   HPI:   Derrick [name] is a 74 y.o. male , caucasian of french, scott -irish  and native [place] ( [name]) decent .  Is seen here as a revisit  from Dr. [name] . His PCP is  Dr. Ronnald Ramp , his ophthalmologist is Dr. [name].   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  He had been for at least 12 years CPAP treatment at a pressure of 16 cm water,  he was also using a full face mask at the time , which was comfortable.  His original diagnostic sleep study took place in 2000. The patient endorsed  Epworth sleepiness score at the of 24 points in the FSS at an elevated rate of 42 points as well. His BMI at the time was 30.  Meanwhile he had lost 65 pounds.  Derrick [name]  underwent a sleep study on 11-30-2011. His AHI at time of study was 42.1 and his RDI 45.5 his REM AHI was 30 and non-REM 42.9 he did not have any supine sleep. He had oxygen desaturations to a nadir of 82% and had a total desaturation time of 52.6 minutes with a the first 3 hours of sleep.The results were indicative of severe sleep apnea and  the patient was titrated to CPAP on 12-05-2011 at the pressure of only 8 cm. He had REM rebound at this setting ,the residual AHI in the night was 0.3 per hour. He also recommended the use of a nasal pillow of possible. The  CPAP was download it in 2013 after 90 days of CPAP he was with a new pressure. He had 100% compliance rate and residual AHI of 3.4 with only moderate to mild air leak that pressure was 8 cm water. There  are peak time per day was 8 hours and 48 minutes. CPAP still set at 8 cm water with  3 cm EPR.  The air leak has slightly increased the residual AHI is 2.2,  even better than last year. The  therapeutic time is 8 hour and 53 minutes.  The patient has 100% compliance. Today he endorsed Epworth sleepiness score at 0 point and the fatigue severity score at only 15 points.  Geriatric depression score was endorsed at he only scored 1 point not suggestive of clinical depression.  Interval history ; Derrick [name] has also a monoclonal gammopathy and has been treated with rituximab the medication had continuously improved his exercise tolerance, finally he was able to walk 30-40 minutes a day.  He has been exercising daily.  He did develop not neuropathic pain,  but some and knee pain on the right and an He has comMRI was now showing DDD on the correspondin spinal level.  Completed his rituximab treatment with Dr. [name]. in February 2014 .He underwent a total of 3  steroid injections at the L5-S1 distribution which had meanwhile shown a benefit.  He has been on gabapentin for neuropathic pain, seems to be related to his MGUS diagnosis .He had developed a severe  cold intolerance progressively actually the last year.  He has noticed since March 2014  and since his Retuximab treatment was completed, that he has some cold cramping as well and toe cramps,  pain and numbness.   Last Time I ordered NCS and EMG.   Dr Jannifer Franklin did a EMG and NCS, and found axonal damage , not a finding of CIDP.  This was after 3 years of MGUS treatment and I answered his questions, "did I ever have CIDP'? I believe ,yes. 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  The patient has hp pain but is ambulatory, he periodically loses control of his foot lifting muscles - stretching exercises help. As to his  obstructive sleep apnea, there is no need for any adjustment at this time. His current settings are comfortable and has residual AHI is very low. In office download confirmed high compliance 02-13-14 . He is extremely intolerant to cold temperature, and has anhydrosis - this is more often seen in dysautonomia with NP.  He was identified as axonal neuropathy by Dr. Jannifer Franklin. I would like to follow the patient once a year for the CPAP exclusively for sleep - RV in 12 month . The CPAP  compliance report for 10-16-14 reveals a AHI of  1.2 and daily user time of  9 hours and 14 minutes, and 100% over 4 hour compliance over 4 hours of nightly use.  He has spinal stenosis - 4 times daily 400 mg neurontin.  A revisit will be scheduled in 12 months. Labs , CBC and Diff by oncologist. Cold agglutinin ordered. Plasma -Electrophoresis reordered.   03-26-15  Interval history Derrick [name] reports that he was recently babysitting the grandchildren in [place] and has noticed that he has less stamina less strength especially in his proximal leg muscles, and he was just yesterday told that he had an abnormal cardiac stress test. He has known coronary artery disease and a stent in the right main coronary artery he was now diagnosed with coronary artery disease in the left main. Catheterization is planned for Monday. As to his obstructive sleep apnea he is doing very well his compliance report from 03-26-15 shows 97% compliance for days of use and for 4 hours of use the average therapy time is 8 hours and 39 minutes at 9 cm water pressure with 2 cm EPR, residual AHI is 1.7. Fatigue severity score is 23 and Epworth sleepiness score is 1. the patient recently has had some desire to take some daytime naps which she has not had for a long time or ever. Back from [place] , he noted a black toe nail, without history of trauma. Was this related to blood clot? Its only the nail , not the whole toe. It is tender to touch. He has an  axonal neuropathy. MGUS, will refer to Podiatrist.   Review of Systems: Out of a complete 14 system review, the patient complains of only the following symptoms, and all other reviewed systems are negative. The patient no longer has any residual daytime sleepiness, his sleep on CPAP has been restorative. He describes no nocturia. Epworth score 1, fatigue severity score 23 points as of 03-26-15. He reports ongoing problems probably worsening problems with his hands " falling asleep" and  feeling tingly and numb.  Cold intolernace.  Axonal neuropathy.  Blue nail- big toe, blood clot, embolism>????  History   Social History  . Marital Status: Married    Spouse Name: N/A  . Number of Children: 2  . Years of Education: MS-Coll.   Occupational History  . Consultant     Chemical Engineer-former   Social History Main Topics  . Smoking status: Never Smoker   . Smokeless tobacco: Never Used  . Alcohol Use: Yes     Comment: Occassionally  . Drug Use: No  . Sexual Activity: Yes    Birth Control/ Protection: None   Other Topics Concern  . Not on file   Social History Narrative   College- [place]; [employer]-environment science and mgt. Married [name])  "76" 1 son- 69; 1 dtr "51" 2 grandchildren. 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 End of life-provided discussive context and provided packet.    Family History  Problem Relation Age of Onset  . Arthritis Mother     severe  . Coronary artery disease Mother   . Coronary artery disease Brother   . Cancer Brother     choriocarcinoma  . Colon polyps Mother     Past Medical History  Diagnosis Date  . BENIGN PROSTATIC HYPERTROPHY, WITH OBSTRUCTION 05/28/2010  . COLONIC POLYPS, ADENOMATOUS, HX OF 05/28/2010  . GENERALIZED OSTEOARTHROSIS UNSPECIFIED SITE 05/28/2010  . GERD 05/28/2010  . HYPERLIPIDEMIA 05/28/2010  . Intrinsic asthma, 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  . Malignant neoplasm of descending colon 05/28/2010  . MITRAL VALVE  PROLAPSE 05/28/2010  . 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 PALPITATIONS, CHRONIC 05/28/2010  . SLEEP APNEA, OBSTRUCTIVE, MODERATE 05/28/2010  . UNSPECIFIED INFLAMMATORY AND TOXIC NEUROPATHY 05/28/2010  . VITAMIN D DEFICIENCY 05/28/2010  . CAD (coronary artery disease)     a. s/p NSTEMI 06/01/11: DES to RCA;  b. cath 06/25/11:   dLM 50-60% (FFR 0.87), prox to mid LAD 40-50%, D1 50%, pCFX 50%, RCA stent ok, dPDA 80%, EF 55-60%.  His FFR was felt to be negative and therefore medical therapy was recommended ;  echo 6/12: EF 55-60%, mild AS   . 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 Coronary artery disease 12/11/2011  . MI (myocardial infarction) 12/11/2011  . Vision loss   . 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 Sarcoidosis     pulmonalis  . Hypertension   . Parotid gland pain     infection  . 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 COPD (chronic obstructive pulmonary disease)   . Diverticulitis   . Colon cancer 2000  . Pneumonia   . Sleep apnea   . 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 Tonsillectomy  1950  . Colon surgery  2000    decending colon 200  . Lung wedge  2001  . Appendectomy  1954  . Knee surgery  2003  . Heart attack  06/03/11  . Cardiac catheterization  06/03/11    stent  . Cataract extraction      Current Outpatient Prescriptions  Medication Sig Dispense Refill  . albuterol (PROVENTIL HFA) 108 (90 BASE) MCG/ACT inhaler Inhale 2 puffs into the lungs every 6 (six) hours as needed. (Patient taking differently: Inhale 2 puffs into the lungs every 6 (six) hours as needed for shortness of breath. ) 18 g 1  . amLODipine (NORVASC) 10 MG tablet TAKE ONE TABLET BY MOUTH EVERY DAY 90 tablet 4  . aspirin 81 MG tablet Take 81 mg by mouth daily.      [name] atorvastatin (LIPITOR)  20 MG tablet TAKE ONE TABLET BY MOUTH ONCE DAILY 90 tablet 3  . bimatoprost (LUMIGAN) 0.01 % SOLN Place 1 drop  into both eyes at bedtime.    . budesonide (PULMICORT) 180 MCG/ACT inhaler Inhale 2 puffs into the lungs 2 (two) times daily. 3 each 3  . celecoxib (CELEBREX) 200 MG capsule TAKE ONE CAPSULE BY MOUTH ONCE DAILY 90 capsule 3  . CHOLECALCIFEROL PO Take 1,000 mg by mouth daily. Vitamin D 3    . clopidogrel (PLAVIX) 75 MG tablet TAKE ONE TABLET BY MOUTH ONCE DAILY 90 tablet 3  . dorzolamide (TRUSOPT) 2 % ophthalmic solution Place 1 drop into both eyes daily.     . finasteride (PROSCAR) 5 MG tablet Take 5 mg by mouth daily.     . fluticasone (FLONASE) 50 MCG/ACT nasal spray Place 2 sprays into both nostrils daily. 48 g 3  . gabapentin (NEURONTIN) 800 MG tablet TAKE ONE TABLET BY MOUTH FOUR TIMES DAILY 360 tablet 2  . Multiple Vitamin (MULTIVITAMIN) tablet Take 1 tablet by mouth daily.      . pantoprazole (PROTONIX) 40 MG tablet TAKE ONE TABLET BY MOUTH ONCE DAILY 90 tablet 3  . tadalafil (CIALIS) 5 MG tablet Take 5 mg by mouth daily.    [name] triamterene-hydrochlorothiazide (MAXZIDE-25) 37.5-25 MG per tablet TAKE ONE TABLET BY MOUTH ONCE DAILY 90 tablet 2   No current facility-administered medications for this visit.    Allergies as of 03/26/2015  . (No Known Allergies)    Vitals: BP 135/79 mmHg  Pulse 47  Resp 12  Ht 5\' 10"  (1.778 m)  Wt 198 lb 6.4 oz (89.994 kg)  BMI 28.47 kg/m2 Last Weight:  Wt Readings from Last 1 Encounters:  03/26/15 198 lb 6.4 oz (89.994 kg)   Last Height:   Ht Readings from Last 1 Encounters:  03/26/15 5\' 10"  (1.778 m)    Physical exam:  General: The patient is awake, alert and appears not in acute distress. The patient is well groomed. Head: Normocephalic, atraumatic. Neck is supple.  Mallampati 1, neck circumference: No lymph node swelling, no parotid swelling.  Cardiovascular:  Regular rate and rhythm, without  murmurs or carotid bruit, and without distended neck veins. Respiratory: Lungs are clear to auscultation. Skin:  Without evidence of edema, or  rash Trunk: BMI is reduced, the patient lost over 60 pounds over last 24 month, as a result of cardiac rehabilitation. Neurologic exam : The patient is awake and alert, oriented to place and time.   Memory subjective described as intact.  There is a normal attention span & concentration ability.  Speech is fluent without  dysarthria, dysphonia or aphasia. Mood and affect are appropriate.  Cranial nerves: Pupils are equal and briskly reactive to light. Funduscopic exam without  evidence of pallor or edema. Extraocular movements  in vertical and horizontal planes intact and without nystagmus. Visual fields by finger perimetry are intact. Hearing to finger rub intact.  Facial sensation intact to fine touch. Facial motor strength is symmetric and tongue and uvula move midline.  Motor exam:   Normal tone and normal muscle bulk and symmetric normal strength in all extremities. Good grip strength is noted.   He has a foot drop on the right foot, that is progressed since 2013.   Sensory:  Fine touch, pinprick and vibration were tested in all extremities. The patient has hyperesthesia in both planta pedis.  There is a slight decrease of vibratory sense at the ankle level, but  the patient reports profound numbness - He also has lost proprioception , more recently this seems to to some degree affect his hands. Coordination: Rapid alternating movements in the fingers/hands is tested and normal. Finger-to-nose maneuver tested and normal without evidence of ataxia, dysmetria or tremor. Gait and station: Patient walks without assistive device and is able and assisted stool climb up to the exam table. Strength within normal limits.  Stance is stable and normal. Tandem gait is fragmented, he needs to look where he steps, lost his " sense of where in space " he is . Romberg testing is positive. Deep tendon reflexes: in the  upper and lower extremities are attenuated , he has a mild foot drop in the right  - Babinski maneuver response is downgoing.   Assessment:  Rituximab has reduced the patient's arthritic symptoms and has certainly beneficial effects on ankylosing spondylitis, the MG [place] may well be associated with these autoimmune disorders at baseline. His neuropathy also did not progress during the rituximab treatment. His last treatment was given in April  2014 and he is now on a " drug holiday" for over one year .   He continues to be monitored by his hematologist-oncologist Dr. Murriel Hopper / Standley Dakins, MD- next appointment May 2016 .  He has  ankylosing spondylitis and axonal neuropathy will likely progress if he is not treated with a specific immune modulation.  His hyperpathia, hyperesthesias and dyseasthesias have increased.  I avoid that the blue toenail on his left hallux is related to a possible blood clot on the other hand I would expect the whole toe and not just the nailbeds to be blue. The patient has no recollection of any trauma. I would like for one of the port he podiatry colleagues in town to have a look at this.  Also when he undergoes his catheterization I would like to make his cardiologist aware of this.  No change in CPAP needed, 100% compliance.

## 2015-03-27 ENCOUNTER — Encounter: Payer: Self-pay | Admitting: *Deleted

## 2015-03-27 ENCOUNTER — Telehealth: Payer: Self-pay | Admitting: Cardiovascular Disease

## 2015-03-27 NOTE — Telephone Encounter (Signed)
Pt calling to go over his cardiac cath instructions and to make sure he is clear with all instructions provided. Thoroughly went over cath instructions with the pt.  Provided pt education on which meds to hold and which one's were safe to take.  Went over cath time and location with the pt.  Pt verbalized understanding and gracious for all the assistance provided.

## 2015-03-27 NOTE — Telephone Encounter (Signed)
New message      Pt is having a cath on Monday.  He has questions to ask the nurse

## 2015-03-28 ENCOUNTER — Telehealth: Payer: Self-pay | Admitting: *Deleted

## 2015-03-28 NOTE — Telephone Encounter (Signed)
pt notified about lab results with verbal understanding and he is all set for his upcoming cath. Pt asked if he should stop taking the cialis on a daily basis that Dr. Acie Fredrickson has him on for BPH, incase he needed to take NTG. I advised pt d/w Dr. Acie Fredrickson.

## 2015-03-31 ENCOUNTER — Encounter (HOSPITAL_COMMUNITY): Payer: Self-pay | Admitting: Cardiovascular Disease

## 2015-03-31 ENCOUNTER — Ambulatory Visit (HOSPITAL_COMMUNITY)
Admission: RE | Admit: 2015-03-31 | Discharge: 2015-03-31 | Disposition: A | Discharge status: Home / Self Care | Payer: Medicare Other | Source: Ambulatory Visit | Attending: Cardiovascular Disease | Admitting: Cardiovascular Disease

## 2015-03-31 ENCOUNTER — Encounter (HOSPITAL_COMMUNITY)
Admission: RE | Disposition: A | Discharge status: Home / Self Care | Payer: Self-pay | Source: Ambulatory Visit | Attending: Cardiovascular Disease

## 2015-03-31 DIAGNOSIS — K219 Gastro-esophageal reflux disease without esophagitis: Secondary | ICD-10-CM | POA: Diagnosis not present

## 2015-03-31 DIAGNOSIS — E785 Hyperlipidemia, unspecified: Secondary | ICD-10-CM | POA: Diagnosis not present

## 2015-03-31 DIAGNOSIS — I2584 Coronary atherosclerosis due to calcified coronary lesion: Secondary | ICD-10-CM | POA: Diagnosis not present

## 2015-03-31 DIAGNOSIS — I2510 Atherosclerotic heart disease of native coronary artery without angina pectoris: Secondary | ICD-10-CM | POA: Diagnosis not present

## 2015-03-31 DIAGNOSIS — J449 Chronic obstructive pulmonary disease, unspecified: Secondary | ICD-10-CM | POA: Diagnosis not present

## 2015-03-31 DIAGNOSIS — I10 Essential (primary) hypertension: Secondary | ICD-10-CM | POA: Insufficient documentation

## 2015-03-31 DIAGNOSIS — Z7982 Long term (current) use of aspirin: Secondary | ICD-10-CM | POA: Diagnosis not present

## 2015-03-31 DIAGNOSIS — I252 Old myocardial infarction: Secondary | ICD-10-CM | POA: Insufficient documentation

## 2015-03-31 DIAGNOSIS — I25119 Atherosclerotic heart disease of native coronary artery with unspecified angina pectoris: Secondary | ICD-10-CM | POA: Diagnosis present

## 2015-03-31 DIAGNOSIS — G4733 Obstructive sleep apnea (adult) (pediatric): Secondary | ICD-10-CM | POA: Insufficient documentation

## 2015-03-31 DIAGNOSIS — R9439 Abnormal result of other cardiovascular function study: Secondary | ICD-10-CM | POA: Insufficient documentation

## 2015-03-31 HISTORY — PX: LEFT HEART CATHETERIZATION WITH CORONARY ANGIOGRAM: SHX5451

## 2015-03-31 SURGERY — LEFT HEART CATHETERIZATION WITH CORONARY ANGIOGRAM
Anesthesia: LOCAL

## 2015-03-31 MED ORDER — SODIUM CHLORIDE 0.9 % IV SOLN: 250.0000 mL | INTRAVENOUS | Status: DC | PRN

## 2015-03-31 MED ORDER — ASPIRIN 81 MG PO CHEW: 81.0000 mg | CHEWABLE_TABLET | ORAL | Status: DC

## 2015-03-31 MED ORDER — LIDOCAINE HCL (PF) 1 % IJ SOLN
INTRAMUSCULAR | Status: AC
  Filled 2015-03-31: qty 30

## 2015-03-31 MED ORDER — FENTANYL CITRATE 0.05 MG/ML IJ SOLN
INTRAMUSCULAR | Status: AC
  Filled 2015-03-31: qty 2

## 2015-03-31 MED ORDER — HEPARIN (PORCINE) IN NACL 2-0.9 UNIT/ML-% IJ SOLN
INTRAMUSCULAR | Status: AC
  Filled 2015-03-31: qty 1000

## 2015-03-31 MED ORDER — ACETAMINOPHEN 325 MG PO TABS: 650.0000 mg | ORAL_TABLET | ORAL | Status: DC | PRN

## 2015-03-31 MED ORDER — SODIUM CHLORIDE 0.9 % IJ SOLN: 3.0000 mL | INTRAMUSCULAR | Status: DC | PRN

## 2015-03-31 MED ORDER — VERAPAMIL HCL 2.5 MG/ML IV SOLN
INTRAVENOUS | Status: AC
  Filled 2015-03-31: qty 2

## 2015-03-31 MED ORDER — ONDANSETRON HCL 4 MG/2ML IJ SOLN: 4.0000 mg | Freq: Four times a day (QID) | INTRAMUSCULAR | Status: DC | PRN

## 2015-03-31 MED ORDER — SODIUM CHLORIDE 0.9 % IV SOLN
INTRAVENOUS | Status: DC
  Administered 2015-03-31: 1000 mL via INTRAVENOUS

## 2015-03-31 MED ORDER — MIDAZOLAM HCL 2 MG/2ML IJ SOLN
INTRAMUSCULAR | Status: AC
  Filled 2015-03-31: qty 2

## 2015-03-31 MED ORDER — SODIUM CHLORIDE 0.9 % IJ SOLN: 3.0000 mL | Freq: Two times a day (BID) | INTRAMUSCULAR | Status: DC

## 2015-03-31 MED ORDER — SODIUM CHLORIDE 0.9 % IV SOLN
1.0000 mL/kg/h | INTRAVENOUS | Status: DC
Start: 2015-03-31 — End: 2015-03-31

## 2015-03-31 MED ORDER — HEPARIN SODIUM (PORCINE) 1000 UNIT/ML IJ SOLN
INTRAMUSCULAR | Status: AC
  Filled 2015-03-31: qty 1

## 2015-03-31 NOTE — Discharge Instructions (Signed)
Radial Site Care °Refer to this sheet in the next few weeks. These instructions provide you with information on caring for yourself after your procedure. Your caregiver may also give you more specific instructions. Your treatment has been planned according to current medical practices, but problems sometimes occur. Call your caregiver if you have any problems or questions after your procedure. °HOME CARE INSTRUCTIONS °· You may shower the day after the procedure. Remove the bandage (dressing) and gently wash the site with plain soap and water. Gently pat the site dry. °· Do not apply powder or lotion to the site. °· Do not submerge the affected site in water for 3 to 5 days. °· Inspect the site at least twice daily. °· Do not flex or bend the affected arm for 24 hours. °· No lifting over 5 pounds (2.3 kg) for 5 days after your procedure. °· Do not drive home if you are discharged the same day of the procedure. Have someone else drive you. °· You may drive 24 hours after the procedure unless otherwise instructed by your caregiver. °· Do not operate machinery or power tools for 24 hours. °· A responsible adult should be with you for the first 24 hours after you arrive home. °What to expect: °· Any bruising will usually fade within 1 to 2 weeks. °· Blood that collects in the tissue (hematoma) may be painful to the touch. It should usually decrease in size and tenderness within 1 to 2 weeks. °SEEK IMMEDIATE MEDICAL CARE IF: °· You have unusual pain at the radial site. °· You have redness, warmth, swelling, or pain at the radial site. °· You have drainage (other than a small amount of blood on the dressing). °· You have chills. °· You have a fever or persistent symptoms for more than 72 hours. °· You have a fever and your symptoms suddenly get worse. °· Your arm becomes pale, cool, tingly, or numb. °· You have heavy bleeding from the site. Hold pressure on the site. °Document Released: 01/15/2011 Document Revised:  03/06/2012 Document Reviewed: 01/15/2011 °[employer]® Patient Information ©2015 [employer]. This information is not intended to replace advice given to you by your health care provider. Make sure you discuss any questions you have with your health care provider. ° °

## 2015-03-31 NOTE — H&P (Signed)
History and Physical  Patient ID: Derrick [name] MRN: [id number], SOB: 12-29-1940 74 y.o. Date of Encounter: 03/31/2015, 12:29 PM  Primary Physician: [name], MD Primary Cardiologist: Burt Knack  Chief Complaint: Abnormal Stress Test  HPI: 74 y.o. male w/ PMHx significant for CAD who presented to [facility] on 03/31/2015 for cardiac catheterization.  The patient is well-known to me. He has a history of an inferior MI treated with primary PCI using drug-eluting stents. He has been followed for moderate left mainstem stenosis. He's done well without typical symptoms of exertional angina. The patient has complained of upper back and shoulder pain intermittently over the past few years. Some of his pain symptoms have been difficult to differentiate. He did have some upper back discomfort at the time of his myocardial infarction. Because of his moderate left mainstem stenosis, he underwent an exercise treadmill study approximately 2 weeks ago. He had ST segment depression at peak exercise extending into recovery. His changes were more pronounced than his previous studies. With his known left main disease and abnormal exercise treadmill study, he was referred for cardiac catheterization. Of note, and 2012 and he underwent pressure wire analysis of the left main lesion with an FFR of 0.87.   Past Medical History  Diagnosis Date  . BENIGN PROSTATIC HYPERTROPHY, WITH OBSTRUCTION 05/28/2010  . COLONIC POLYPS, ADENOMATOUS, HX OF 05/28/2010  . GENERALIZED OSTEOARTHROSIS UNSPECIFIED SITE 05/28/2010  . GERD 05/28/2010  . HYPERLIPIDEMIA 05/28/2010  . Intrinsic asthma, 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  . Malignant neoplasm of descending colon 05/28/2010  . MITRAL VALVE PROLAPSE 05/28/2010  . 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 PALPITATIONS, CHRONIC 05/28/2010  . SLEEP APNEA, OBSTRUCTIVE, MODERATE 05/28/2010  . UNSPECIFIED INFLAMMATORY AND TOXIC NEUROPATHY 05/28/2010  . VITAMIN D DEFICIENCY 05/28/2010  . CAD (coronary artery  disease)     a. s/p NSTEMI 06/01/11: DES to RCA;  b. cath 06/25/11:   dLM 50-60% (FFR 0.87), prox to mid LAD 40-50%, D1 50%, pCFX 50%, RCA stent ok, dPDA 80%, EF 55-60%.  His FFR was felt to be negative and therefore medical therapy was recommended ;  echo 6/12: EF 55-60%, mild AS   . 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 Coronary artery disease 12/11/2011  . MI (myocardial infarction) 12/11/2011  . Vision loss   . 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 Sarcoidosis     pulmonalis  . Hypertension   . Parotid gland pain     infection  . 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 COPD (chronic obstructive pulmonary disease)   . Diverticulitis   . Colon cancer 2000  . Pneumonia   . Sleep apnea   . 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 Tonsillectomy  1950  . Colon surgery  2000    decending colon 200  . Lung wedge  2001  . Appendectomy  1954  . Knee surgery  2003  . Heart attack  06/03/11  . Cardiac catheterization  06/03/11    stent  . Cataract extraction       Home Meds: Prior to Admission medications   Medication Sig Start Date End Date Taking? Authorizing Provider  amLODipine (NORVASC) 10 MG tablet TAKE ONE TABLET BY MOUTH EVERY DAY 07/01/14  Yes Janith Lima, MD  aspirin 81 MG tablet Take 81 mg by mouth daily.     Yes Historical Provider, MD  atorvastatin (LIPITOR) 20  MG tablet TAKE ONE TABLET BY MOUTH ONCE DAILY 10/18/14  Yes [name], MD  bimatoprost (LUMIGAN) 0.01 % SOLN Place 1 drop into both eyes at bedtime.   Yes Historical Provider, MD  budesonide (PULMICORT) 180 MCG/ACT inhaler Inhale 2 puffs into the lungs 2 (two) times daily. 04/04/14  Yes Neena Rhymes, MD  celecoxib (CELEBREX) 200 MG capsule TAKE ONE CAPSULE BY MOUTH ONCE DAILY 10/08/14  Yes Janith Lima, MD  CHOLECALCIFEROL PO Take 1,000 mg by mouth daily. Vitamin D 3   Yes Historical  Provider, MD  clopidogrel (PLAVIX) 75 MG tablet TAKE ONE TABLET BY MOUTH ONCE DAILY 10/18/14  Yes [name], MD  dorzolamide (TRUSOPT) 2 % ophthalmic solution Place 1 drop into both eyes daily.  01/28/14  Yes Historical Provider, MD  finasteride (PROSCAR) 5 MG tablet Take 5 mg by mouth daily.  10/13/12  Yes Historical Provider, MD  fluticasone (FLONASE) 50 MCG/ACT nasal spray Place 2 sprays into both nostrils daily. 02/14/15  Yes Janith Lima, MD  gabapentin (NEURONTIN) 800 MG tablet TAKE ONE TABLET BY MOUTH FOUR TIMES DAILY 01/01/15  Yes Janith Lima, MD  Multiple Vitamin (MULTIVITAMIN) tablet Take 1 tablet by mouth daily.     Yes Historical Provider, MD  pantoprazole (PROTONIX) 40 MG tablet TAKE ONE TABLET BY MOUTH ONCE DAILY 10/18/14  Yes [name], MD  tadalafil (CIALIS) 5 MG tablet Take 5 mg by mouth daily.   Yes Historical Provider, MD  triamterene-hydrochlorothiazide (MAXZIDE-25) 37.5-25 MG per tablet TAKE ONE TABLET BY MOUTH ONCE DAILY 02/25/15  Yes Janith Lima, MD  albuterol (PROVENTIL HFA) 108 (90 BASE) MCG/ACT inhaler Inhale 2 puffs into the lungs every 6 (six) hours as needed. Patient taking differently: Inhale 2 puffs into the lungs every 6 (six) hours as needed for shortness of breath.  09/05/14   Janith Lima, MD    Allergies: No Known Allergies  History   Social History  . Marital Status: Married    Spouse Name: N/A  . Number of Children: 2  . Years of Education: MS-Coll.   Occupational History  . Consultant     Chemical Engineer-former   Social History Main Topics  . Smoking status: Never Smoker   . Smokeless tobacco: Never Used  . Alcohol Use: Yes     Comment: Occassionally  . Drug Use: No  . Sexual Activity: Yes    Birth Control/ Protection: None   Other Topics Concern  . Not on file   Social History Narrative   College- [employer]; [employer]-environment science and mgt. Married [name])  "6" 1 son- 48; 1 dtr "35" 2 grandchildren. Consultant in  environment mgt, retired-[employer] End of life-provided discussive context and provided packet.     Family History  Problem Relation Age of Onset  . Arthritis Mother     severe  . Coronary artery disease Mother   . Coronary artery disease Brother   . Cancer Brother     choriocarcinoma  . Colon polyps Mother     Review of Systems: General: negative for chills, fever, night sweats or weight changes. Positive for generalized fatigue ENT: negative for rhinorrhea or epistaxis Cardiovascular: See history of present illness Dermatological: negative for rash Respiratory: negative for cough or wheezing GI: negative for nausea, vomiting, diarrhea, bright red blood per rectum, melena, or hematemesis GU: no hematuria, urgency, or frequency Neurologic: negative for visual changes, syncope, headache, or dizziness Heme: no easy bruising or bleeding Endo: negative for  excessive thirst, thyroid disorder, or flushing Musculoskeletal: Positive for shoulder, back, and neck pain All other systems reviewed and are otherwise negative except as noted above.  Physical Exam: Blood pressure 147/82, pulse 52, temperature 97.8 F (36.6 C), temperature source Oral, resp. rate 18, height 5\' 10"  (1.778 m), weight 193 lb (87.544 kg), SpO2 93 %. General: Well developed, well nourished, alert and oriented, in no acute distress. HEENT: Normocephalic, atraumatic, sclera anicteric Neck: Supple. Carotids 2+ without bruits. JVP normal Lungs: Clear bilaterally to auscultation without wheezes, rales, or rhonchi. Breathing is unlabored. Heart: RRR with normal S1 and S2. No murmurs, rubs, or gallops appreciated. Abdomen: Soft, non-tender, non-distended with normoactive bowel sounds. No hepatomegaly. No rebound/guarding. No obvious abdominal masses. Back: No CVA tenderness Msk:  Strength and tone appear normal for age. Extremities: No clubbing, cyanosis, or edema.  Distal pedal pulses are 2+ and equal  bilaterally. Neuro: CNII-XII intact, moves all extremities spontaneously. Psych:  Responds to questions appropriately with a normal affect. Skin: warm and dry without rash   Labs:   Lab Results  Component Value Date   WBC 4.1 03/26/2015   HGB 14.1 03/26/2015   HCT 41.4 03/26/2015   MCV 91.5 03/26/2015   PLT 163.0 03/26/2015    Recent Labs Lab 03/26/15 0809  NA 139  K 3.9  CL 103  CO2 33*  BUN 20  CREATININE 0.99  CALCIUM 10.6*  GLUCOSE 100*   No results for input(s): CKTOTAL, CKMB, TROPONINI in the last 72 hours. Lab Results  Component Value Date   CHOL 171 02/06/2015   HDL 62.80 02/06/2015   LDLCALC 96 02/06/2015   TRIG 62.0 02/06/2015   Lab Results  Component Value Date   DDIMER * 06/01/2011    0.59        AT THE INHOUSE ESTABLISHED CUTOFF VALUE OF 0.48 ug/mL FEU, THIS ASSAY HAS BEEN DOCUMENTED IN THE LITERATURE TO HAVE A SENSITIVITY AND NEGATIVE PREDICTIVE VALUE OF AT LEAST 98 TO 99%.  THE TEST RESULT SHOULD BE CORRELATED WITH AN ASSESSMENT OF THE CLINICAL PROBABILITY OF DVT / VTE.    Radiology/Studies:  No results found.   CARDIAC STUDIES: ETT: ST Segment Analysis At Rest:normal ST segments - no evidence of significant ST depression With Exercise:significant ischemic ST depression  Other Information Arrhythmia: Saundra Shelling during ETT: present (1) Quality of ETT: diagnostic  ETT Interpretation: abnormal - evidence of ST depression consistent with ischemia  Comments: Good exercise capacity. He complained of "lung burning" at peak exercise.  Normal BP response to exercise. There was significant ST depression at peak exercise. This was present in recovery. 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 Occasional Mobitz 1 noted. This was all in pre-exercise.  ASSESSMENT AND PLAN:  74 year old gentleman with history of inferior MI, moderate left mainstem stenosis treated medically, and a recent abnormal exercise  treadmill test. He presents today for cardiac catheterization  I have reviewed the risks, indications, and alternatives to cardiac catheterization with the patient. Risks include but are not limited to bleeding, infection, vascular injury, stroke, myocardial infection, arrhythmia, kidney injury, radiation-related injury in the case of prolonged fluoroscopy use, emergency cardiac surgery, and death. The patient understands the risks of serious complication is low (<1%).   Signed, [name] MD 03/31/2015, 12:29 PM

## 2015-03-31 NOTE — CV Procedure (Signed)
    Cardiac Catheterization Procedure Note  Name: Derrick [name] MRN: [id number] DOB: 1941-12-25  Procedure: Left Heart Cath, Selective Coronary Angiography, LV angiography  Indication: Abnormal ETT, Known CAD with moderate LM disease and hx Inferior MI   Procedural Details: The right wrist was prepped, draped, and anesthetized with 1% lidocaine. Using the modified Seldinger technique, a 5/6 French Slender sheath was introduced into the right radial artery. 3 mg of verapamil was administered through the sheath, weight-based unfractionated heparin was administered intravenously. Standard Judkins catheters were used for selective coronary angiography and left ventriculography. Catheter exchanges were performed over an exchange length guidewire. There were no immediate procedural complications. A TR band was used for radial hemostasis at the completion of the procedure.  The patient was transferred to the post catheterization recovery area for further monitoring.  Procedural Findings: Hemodynamics: AO 143/64 LV 142/11  Coronary angiography: Coronary dominance: right  Left mainstem: The left mainstem is patent. The proximal and mid left main have no significant obstruction. The distal left main has 60% stenosis without significant change from previous studies. The vessel divides into the LAD and left circumflex.  Left anterior descending (LAD): The LAD is heavily calcified. The proximal LAD has 60% heavy calcific stenosis. This involves the ostium of the vessel. The diagonal branches are small to medium in caliber. The ostium of the first diagonal has 80% stenosis. The second and third diagonals have no significant disease. The mid LAD is diffusely diseased with 50% stenosis. The LAD wraps the LV apex.  Left circumflex (LCx): The left circumflex is also calcified. The vessel begins with an acute angulation from the left main. The OM branches are patent. The proximal circumflex has 40-50%  stenosis.  Right coronary artery (RCA): The RCA is dominant. The vessel is moderately calcified. The stented segment through the proximal and mid vessel is widely patent without significant stenosis. The ostium of the RCA has 20-30% stenosis. The distal RCA is diffusely diseased with 30-40% stenosis. The PLA branch is patent. The PDA branch has a 70% stenosis in its midportion. The vessel is fairly small through this region.  Left ventriculography: Left ventricular systolic function is normal, LVEF is estimated at 55-65%, there is no significant mitral regurgitation   Contrast: 70 cc Omnipaque  Estimated Blood Loss: Minimal  Final Conclusions:   1. Moderate distal left mainstem disease, without significant progression from the patient's 2012 cardiac catheterization study 2. Mild to moderate diffusely calcified stenosis of the LAD 3. Mild nonobstructive stenosis of the left circumflex 4. Continued stent patency of the RCA with moderate PDA stenosis 5. Preserved LV systolic function  Recommendations: I have carefully reviewed the patients cardiac catheterization study and compared it to his previous study from 2012. While his stress test was abnormal, he is not limited by symptoms of angina. I would recommend ongoing medical therapy as his only other option for treatment would be multivessel CABG. Certainly, if he has progressive symptoms, cardiac surgery could be considered  [name] MD, [facility] 03/31/2015, 1:37 PM

## 2015-04-04 ENCOUNTER — Ambulatory Visit (INDEPENDENT_AMBULATORY_CARE_PROVIDER_SITE_OTHER): Payer: Medicare Other | Admitting: Podiatry

## 2015-04-04 ENCOUNTER — Encounter: Payer: Self-pay | Admitting: Podiatry

## 2015-04-04 VITALS — BP 144/66 | HR 53 | Ht 70.0 in | Wt 195.0 lb

## 2015-04-04 DIAGNOSIS — I25810 Atherosclerosis of coronary artery bypass graft(s) without angina pectoris: Secondary | ICD-10-CM

## 2015-04-04 DIAGNOSIS — S90212A Contusion of left great toe with damage to nail, initial encounter: Secondary | ICD-10-CM | POA: Diagnosis not present

## 2015-04-04 DIAGNOSIS — M205X2 Other deformities of toe(s) (acquired), left foot: Secondary | ICD-10-CM | POA: Insufficient documentation

## 2015-04-04 NOTE — Progress Notes (Signed)
Subjective: 74 year old male presents stating that on 03/20/15, as returning from a trip to [place] in Air plane and noted of black toe nail left great toe. Does not recall any trauma but walked a lot. Has had throbbing pain. Now it is not hurting to pressure. He is involved in daily walking exercise.   Review of systems reveal past treated for neuropathy, chronic demyelinized neuropathy, Pulmonary sarcoidosis. 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 Been treated with targeted Chemo therapy for Neuropathy condition x 2 years and been effective.  Objective: Vascular status are within normal. Monofilament sensory testing is positive for decreased and weak response on both feet.  Dermatologic: Dark brown and black discolerated left great toe nail without associated redness, drainage, or edema.   Orthopedic: Mild degree of first ray elevatus upon loading of forefoot bilateral.  Assessment: Micro trauma, (retrograde force due to hallux limitus) to left great toe nail with elongated nails. Hallux limitus bilateral.  Plan: Reviewed clinical findings and available treatment options, open toed shoes as an option. All nails debrided.  Return as needed.

## 2015-04-04 NOTE — Patient Instructions (Signed)
Seen for injured toe nail left great toe. Possible micro trauma to the left great toe. All nails debrided. Return as needed.

## 2015-04-28 ENCOUNTER — Other Ambulatory Visit (HOSPITAL_BASED_OUTPATIENT_CLINIC_OR_DEPARTMENT_OTHER): Payer: Medicare Other

## 2015-04-28 DIAGNOSIS — G63 Polyneuropathy in diseases classified elsewhere: Secondary | ICD-10-CM | POA: Diagnosis not present

## 2015-04-28 DIAGNOSIS — D472 Monoclonal gammopathy: Secondary | ICD-10-CM | POA: Diagnosis not present

## 2015-04-28 LAB — COMPREHENSIVE METABOLIC PANEL (CC13)
ALT: 22 U/L (ref 0–55)
AST: 21 U/L (ref 5–34)
Albumin: 4.1 g/dL (ref 3.5–5.0)
Alkaline Phosphatase: 53 U/L (ref 40–150)
Anion Gap: 8 mEq/L (ref 3–11)
BUN: 17.9 mg/dL (ref 7.0–26.0)
CO2: 29 mEq/L (ref 22–29)
Calcium: 9.8 mg/dL (ref 8.4–10.4)
Chloride: 103 mEq/L (ref 98–109)
Creatinine: 0.8 mg/dL (ref 0.7–1.3)
EGFR: 87 mL/min/{1.73_m2} — ABNORMAL LOW (ref >90)
Glucose: 102 mg/dl (ref 70–140)
Potassium: 4.1 mEq/L (ref 3.5–5.1)
Sodium: 140 mEq/L (ref 136–145)
Total Bilirubin: 0.89 mg/dL (ref 0.20–1.20)
Total Protein: 6.7 g/dL (ref 6.4–8.3)

## 2015-04-28 LAB — CBC WITH DIFFERENTIAL/PLATELET
BASO%: 1 % (ref 0.0–2.0)
Basophils Absolute: 0 10*3/uL (ref 0.0–0.1)
EOS%: 5.7 % (ref 0.0–7.0)
Eosinophils Absolute: 0.2 10*3/uL (ref 0.0–0.5)
HCT: 39.5 % (ref 38.4–49.9)
HGB: 13.4 g/dL (ref 13.0–17.1)
LYMPH%: 24.4 % (ref 14.0–49.0)
MCH: 30.5 pg (ref 27.2–33.4)
MCHC: 33.9 g/dL (ref 32.0–36.0)
MCV: 89.9 fL (ref 79.3–98.0)
MONO#: 0.5 10*3/uL (ref 0.1–0.9)
MONO%: 12.5 % (ref 0.0–14.0)
NEUT#: 2.3 10*3/uL (ref 1.5–6.5)
NEUT%: 56.4 % (ref 39.0–75.0)
Platelets: 158 10*3/uL (ref 140–400)
RBC: 4.4 10*6/uL (ref 4.20–5.82)
RDW: 12.8 % (ref 11.0–14.6)
WBC: 4 10*3/uL (ref 4.0–10.3)
lymph#: 1 10*3/uL (ref 0.9–3.3)

## 2015-04-28 LAB — LACTATE DEHYDROGENASE (CC13): LDH: 193 U/L (ref 125–245)

## 2015-04-30 LAB — KAPPA/LAMBDA LIGHT CHAINS
Kappa free light chain: 1.9 mg/dL (ref 0.33–1.94)
Kappa:Lambda Ratio: 1.31 (ref 0.26–1.65)
Lambda Free Lght Chn: 1.45 mg/dL (ref 0.57–2.63)

## 2015-04-30 LAB — SPEP & IFE WITH QIG
Abnormal Protein Band1: 0.2 g/dL
Albumin ELP: 4.3 g/dL (ref 3.8–4.8)
Alpha-1-Globulin: 0.2 g/dL (ref 0.2–0.3)
Alpha-2-Globulin: 0.6 g/dL (ref 0.5–0.9)
Beta 2: 0.2 g/dL (ref 0.2–0.5)
Beta Globulin: 0.4 g/dL (ref 0.4–0.6)
Gamma Globulin: 0.9 g/dL (ref 0.8–1.7)
IgA: 135 mg/dL (ref 68–379)
IgG (Immunoglobin G), Serum: 778 mg/dL (ref 650–1600)
IgM, Serum: 246 mg/dL (ref 41–251)
Total Protein, Serum Electrophoresis: 6.6 g/dL (ref 6.1–8.1)

## 2015-04-30 LAB — BETA 2 MICROGLOBULIN, SERUM: Beta-2 Microglobulin: 2.77 mg/L — ABNORMAL HIGH (ref <=2.51)

## 2015-05-05 ENCOUNTER — Telehealth: Payer: Self-pay | Admitting: Hematology and Oncology

## 2015-05-05 ENCOUNTER — Encounter: Payer: Self-pay | Admitting: Hematology and Oncology

## 2015-05-05 ENCOUNTER — Ambulatory Visit (HOSPITAL_BASED_OUTPATIENT_CLINIC_OR_DEPARTMENT_OTHER): Payer: Medicare Other | Admitting: Hematology and Oncology

## 2015-05-05 VITALS — BP 128/65 | HR 56 | Temp 97.8°F | Resp 18 | Ht 70.0 in | Wt 194.9 lb

## 2015-05-05 DIAGNOSIS — G6181 Chronic inflammatory demyelinating polyneuritis: Secondary | ICD-10-CM | POA: Diagnosis not present

## 2015-05-05 DIAGNOSIS — R9439 Abnormal result of other cardiovascular function study: Secondary | ICD-10-CM | POA: Diagnosis not present

## 2015-05-05 DIAGNOSIS — D472 Monoclonal gammopathy: Secondary | ICD-10-CM

## 2015-05-05 NOTE — Assessment & Plan Note (Signed)
The patient is follow with cardiologist on a regular basis and is on medical management.

## 2015-05-05 NOTE — Assessment & Plan Note (Signed)
IgM level is within normal limits and M spike is stable. He is not symptomatic apart from his neuropathy I recommend observation only with history, physical examination and blood work in 6 months.

## 2015-05-05 NOTE — Progress Notes (Signed)
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  I reviewed the patient's records extensive and collaborated the history with the patient. 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 He was initially evaluated and treated by a hematologist and a neurologist in [place] before he moved to [place]. 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 He had bone marrow aspirate and biopsy which showed no evidence of malignancy in his bone marrow. His neurologic condition is characterized primarily by painful distal neuropathy, feet worse than hands, and lower extremity weakness. At one point he could barely walk a block without having to stop due to pain and weakness. He was given an initial trial of Rituxan in 2010 with no significant improvement. However following reinitiation of Rituxan in April 2012 he did get a progressive improvement. He was put on a maintenance program which we have continued in this office on an every other month basis through 03/23/2013.   INTERVAL HISTORY: Please see below for problem oriented charting. Since he was last seen here, his complained of persistent numbness and pain at the lower extremities. He denies any problems with fine motor skills of his hands. He denies any new neurological deficit. 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 The patient also  has diagnosis of sarcoidosis but he denies any cough, chest pain or shortness breath. He also had a remote history of colon cancer, resected, without the need for adjuvant treatment. He is due for another screening colonoscopy. He denies any change in bowel habits. Denies any melena or hematochezia or unexplained weight loss. 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  REVIEW OF SYSTEMS:   Constitutional: Denies fevers, chills or abnormal weight loss Eyes: Denies blurriness of vision Ears, nose, mouth, throat, and face: Denies mucositis or sore throat Respiratory: Denies cough, dyspnea or wheezes Cardiovascular: Denies palpitation, chest discomfort or lower extremity swelling Gastrointestinal:  Denies nausea, heartburn or change in bowel habits Skin: Denies abnormal skin rashes Lymphatics: Denies new lymphadenopathy or easy bruising Behavioral/Psych: Mood is stable, no new changes  All other systems were reviewed with the patient and are negative.  I have reviewed the past medical history, past surgical history, social history and family history with the patient and they are unchanged from previous note.  ALLERGIES:  has No Known Allergies.  MEDICATIONS:  Current Outpatient Prescriptions  Medication Sig Dispense Refill  . amLODipine (NORVASC) 10 MG tablet TAKE ONE TABLET BY MOUTH EVERY DAY 90 tablet 4  . aspirin 81 MG tablet Take 81 mg by mouth daily.      [name] atorvastatin (LIPITOR) 20 MG tablet TAKE ONE TABLET BY MOUTH ONCE DAILY 90 tablet 3  . bimatoprost (LUMIGAN) 0.01 % SOLN Place 1 drop into both eyes at bedtime.    . budesonide (PULMICORT) 180 MCG/ACT inhaler Inhale 2 puffs into the lungs 2 (two) times  daily. 3 each 3  . celecoxib (CELEBREX) 200 MG capsule TAKE ONE CAPSULE BY MOUTH ONCE DAILY 90 capsule 3  . CHOLECALCIFEROL PO Take 1,000 mg by mouth daily. Vitamin D 3    . clopidogrel (PLAVIX) 75 MG tablet TAKE ONE  TABLET BY MOUTH ONCE DAILY 90 tablet 3  . dorzolamide (TRUSOPT) 2 % ophthalmic solution Place 1 drop into both eyes daily.     . finasteride (PROSCAR) 5 MG tablet Take 5 mg by mouth daily.     . fluticasone (FLONASE) 50 MCG/ACT nasal spray Place 2 sprays into both nostrils daily. 48 g 3  . gabapentin (NEURONTIN) 800 MG tablet TAKE ONE TABLET BY MOUTH FOUR TIMES DAILY 360 tablet 2  . Multiple Vitamin (MULTIVITAMIN) tablet Take 1 tablet by mouth daily.      . pantoprazole (PROTONIX) 40 MG tablet TAKE ONE TABLET BY MOUTH ONCE DAILY 90 tablet 3  . tadalafil (CIALIS) 5 MG tablet Take 5 mg by mouth daily.    [name] triamterene-hydrochlorothiazide (MAXZIDE-25) 37.5-25 MG per tablet TAKE ONE TABLET BY MOUTH ONCE DAILY 90 tablet 2  . albuterol (PROVENTIL HFA) 108 (90 BASE) MCG/ACT inhaler Inhale 2 puffs into the lungs every 6 (six) hours as needed. (Patient not taking: Reported on 05/05/2015) 18 g 1   No current facility-administered medications for this visit.    PHYSICAL EXAMINATION: ECOG PERFORMANCE STATUS: 1 - Symptomatic but completely ambulatory  Filed Vitals:   05/05/15 0917  BP: 128/65  Pulse: 56  Temp: 97.8 F (36.6 C)  Resp: 18   Filed Weights   05/05/15 0917  Weight: 194 lb 14.4 oz (88.406 kg)    GENERAL:alert, no distress and comfortable SKIN: skin color, texture, turgor are normal, no rashes or significant lesions EYES: normal, Conjunctiva are pink and non-injected, sclera clear Musculoskeletal:no cyanosis of digits and no clubbing  NEURO: alert & oriented x 3 with fluent speech, no focal motor/sensory deficits  LABORATORY DATA:  I have reviewed the data as listed    Component Value Date/Time   NA 140 04/28/2015 0822   NA 139 03/26/2015 0809   K 4.1 04/28/2015 0822   K 3.9 03/26/2015 0809   CL 103 03/26/2015 0809   CL 104 03/23/2013 0855   CO2 29 04/28/2015 0822   CO2 33* 03/26/2015 0809   GLUCOSE 102 04/28/2015 0822   GLUCOSE 100* 03/26/2015 0809   GLUCOSE 98  03/23/2013 0855   BUN 17.9 04/28/2015 0822   BUN 20 03/26/2015 0809   CREATININE 0.8 04/28/2015 0822   CREATININE 0.99 03/26/2015 0809   CREATININE 0.87 07/30/2011 0733   CALCIUM 9.8 04/28/2015 0822   CALCIUM 10.6* 03/26/2015 0809   PROT 6.7 04/28/2015 0822   PROT 7.3 02/06/2015 1025   ALBUMIN 4.1 04/28/2015 0822   ALBUMIN 4.7 02/06/2015 1025   AST 21 04/28/2015 0822   AST 19 02/06/2015 1025   ALT 22 04/28/2015 0822   ALT 19 02/06/2015 1025   ALKPHOS 53 04/28/2015 0822   ALKPHOS 57 02/06/2015 1025   BILITOT 0.89 04/28/2015 0822   BILITOT 0.7 02/06/2015 1025   GFRNONAA >60 06/05/2011 0350   GFRAA >60 06/05/2011 0350    No results found for: SPEP, UPEP  Lab Results  Component Value Date   WBC 4.0 04/28/2015   NEUTROABS 2.3 04/28/2015   HGB 13.4 04/28/2015   HCT 39.5 04/28/2015   MCV 89.9 04/28/2015   PLT 158 04/28/2015      Chemistry      Component  Value Date/Time   NA 140 04/28/2015 0822   NA 139 03/26/2015 0809   K 4.1 04/28/2015 0822   K 3.9 03/26/2015 0809   CL 103 03/26/2015 0809   CL 104 03/23/2013 0855   CO2 29 04/28/2015 0822   CO2 33* 03/26/2015 0809   BUN 17.9 04/28/2015 0822   BUN 20 03/26/2015 0809   CREATININE 0.8 04/28/2015 0822   CREATININE 0.99 03/26/2015 0809   CREATININE 0.87 07/30/2011 0733      Component Value Date/Time   CALCIUM 9.8 04/28/2015 0822   CALCIUM 10.6* 03/26/2015 0809   ALKPHOS 53 04/28/2015 0822   ALKPHOS 57 02/06/2015 1025   AST 21 04/28/2015 0822   AST 19 02/06/2015 1025   ALT 22 04/28/2015 0822   ALT 19 02/06/2015 1025   BILITOT 0.89 04/28/2015 0822   BILITOT 0.7 02/06/2015 1025      ASSESSMENT & PLAN:  MGUS (monoclonal gammopathy of unknown significance) IgM level is within normal limits and M spike is stable. He is not symptomatic apart from his neuropathy I recommend observation only with history, physical examination and blood work in 6 months.     CIDP (chronic inflammatory demyelinating  polyneuropathy) This is stable. He was seen by a neurologist but not by a rheumatologist. I have no objective assessment for this. He also had polyarthritis including ankylosing spondylitis which may respond to rituximab treatment although I have no clinical experience on this. After very prolonged discussion, the patient once to be observed only.   Abnormal stress electrocardiogram test using treadmill The patient is follow with cardiologist on a regular basis and is on medical management.    Orders Placed This Encounter  Procedures  . DG Bone Survey Met    Standing Status: Future     Number of Occurrences:      Standing Expiration Date: 07/04/2016    Order Specific Question:  Reason for Exam (SYMPTOM  OR DIAGNOSIS REQUIRED)    Answer:  staging myeloma    Order Specific Question:  Preferred imaging location?    Answer:  [facility]  . CBC with Differential/Platelet    Standing Status: Future     Number of Occurrences:      Standing Expiration Date: 07/04/2016  . Comprehensive metabolic panel    Standing Status: Future     Number of Occurrences:      Standing Expiration Date: 07/04/2016  . Lactate dehydrogenase    Standing Status: Future     Number of Occurrences:      Standing Expiration Date: 07/04/2016  . SPEP & IFE with QIG    Standing Status: Future     Number of Occurrences:      Standing Expiration Date: 07/04/2016  . Kappa/lambda light chains    Standing Status: Future     Number of Occurrences:      Standing Expiration Date: 07/04/2016  . Beta 2 microglobulin, serum    Standing Status: Future     Number of Occurrences:      Standing Expiration Date: 07/04/2016  . Sedimentation rate    Standing Status: Future     Number of Occurrences:      Standing Expiration Date: 07/04/2016  . Protein Electro, 24-Hour Urine    Standing Status: Future     Number of Occurrences:      Standing Expiration Date: 07/04/2016  . Immunofixation interpretive, urine    Standing Status:  Future     Number of Occurrences:  Standing Expiration Date: 07/04/2016  . IFE, Urine (with Tot Prot)    Standing Status: Future     Number of Occurrences:      Standing Expiration Date: 07/04/2016   All questions were answered. The patient knows to call the clinic with any problems, questions or concerns. No barriers to learning was detected. I spent 15 minutes counseling the patient face to face. The total time spent in the appointment was 20 minutes and more than 50% was on counseling and review of test results     [facility], [place], MD 05/05/2015 10:06 AM

## 2015-05-05 NOTE — Assessment & Plan Note (Signed)
This is stable. He was seen by a neurologist but not by a rheumatologist. I have no objective assessment for this. He also had polyarthritis including ankylosing spondylitis which may respond to rituximab treatment although I have no clinical experience on this. After very prolonged discussion, the patient once to be observed only.

## 2015-05-05 NOTE — Telephone Encounter (Signed)
Gave and printed appt sched and avs fo rpt for OCT and NOV  °

## 2015-07-08 DIAGNOSIS — H4011X2 Primary open-angle glaucoma, moderate stage: Secondary | ICD-10-CM | POA: Diagnosis not present

## 2015-07-08 DIAGNOSIS — Z961 Presence of intraocular lens: Secondary | ICD-10-CM | POA: Diagnosis not present

## 2015-07-08 DIAGNOSIS — H2511 Age-related nuclear cataract, right eye: Secondary | ICD-10-CM | POA: Diagnosis not present

## 2015-07-08 DIAGNOSIS — H31012 Macula scars of posterior pole (postinflammatory) (post-traumatic), left eye: Secondary | ICD-10-CM | POA: Diagnosis not present

## 2015-08-11 DIAGNOSIS — H4011X2 Primary open-angle glaucoma, moderate stage: Secondary | ICD-10-CM | POA: Diagnosis not present

## 2015-09-02 ENCOUNTER — Ambulatory Visit (INDEPENDENT_AMBULATORY_CARE_PROVIDER_SITE_OTHER): Payer: Medicare Other

## 2015-09-02 ENCOUNTER — Telehealth: Payer: Self-pay | Admitting: Internal Medicine

## 2015-09-02 DIAGNOSIS — M19042 Primary osteoarthritis, left hand: Secondary | ICD-10-CM

## 2015-09-02 DIAGNOSIS — M19041 Primary osteoarthritis, right hand: Secondary | ICD-10-CM

## 2015-09-02 DIAGNOSIS — Z23 Encounter for immunization: Secondary | ICD-10-CM | POA: Diagnosis not present

## 2015-09-02 NOTE — Telephone Encounter (Signed)
Pt called in and said that he would like to have that referral to someone about his thumb.  He said that he talked to Dr Ronnald Ramp about it on last visit

## 2015-09-02 NOTE — Telephone Encounter (Signed)
Pt requesting referral for thumb. Pt states discussed on last OV.

## 2015-09-02 NOTE — Telephone Encounter (Signed)
Referral ordered

## 2015-09-10 DIAGNOSIS — D1800 Hemangioma unspecified site: Secondary | ICD-10-CM | POA: Diagnosis not present

## 2015-09-10 DIAGNOSIS — Z85828 Personal history of other malignant neoplasm of skin: Secondary | ICD-10-CM | POA: Diagnosis not present

## 2015-09-10 DIAGNOSIS — L821 Other seborrheic keratosis: Secondary | ICD-10-CM | POA: Diagnosis not present

## 2015-09-10 DIAGNOSIS — L814 Other melanin hyperpigmentation: Secondary | ICD-10-CM | POA: Diagnosis not present

## 2015-09-10 NOTE — Telephone Encounter (Signed)
Pt came by wanting to know if a referral had been placed for an issue with his hand.  Pt was made aware a referral has been put in by Dr. Ronnald Ramp and that it is being worked on.  Please communicate with pt via My Chart with details once appt is made.

## 2015-09-11 NOTE — Telephone Encounter (Signed)
Pt scheduled w Dr. Cyndia Diver 09/15/15. Sent MyChart msg per pt request.

## 2015-09-30 ENCOUNTER — Other Ambulatory Visit: Payer: Self-pay | Admitting: Cardiovascular Disease

## 2015-09-30 ENCOUNTER — Other Ambulatory Visit: Payer: Self-pay | Admitting: Internal Medicine

## 2015-10-01 ENCOUNTER — Other Ambulatory Visit: Payer: Self-pay

## 2015-10-01 DIAGNOSIS — G5602 Carpal tunnel syndrome, left upper limb: Secondary | ICD-10-CM | POA: Diagnosis not present

## 2015-10-01 DIAGNOSIS — M1812 Unilateral primary osteoarthritis of first carpometacarpal joint, left hand: Secondary | ICD-10-CM | POA: Diagnosis not present

## 2015-10-01 DIAGNOSIS — M47812 Spondylosis without myelopathy or radiculopathy, cervical region: Secondary | ICD-10-CM | POA: Diagnosis not present

## 2015-10-03 ENCOUNTER — Telehealth: Payer: Self-pay | Admitting: Hematology and Oncology

## 2015-10-03 NOTE — Telephone Encounter (Signed)
returned call and s.w. pt and r/s appt ....pt ok and aware of new d.t °

## 2015-10-08 ENCOUNTER — Ambulatory Visit (INDEPENDENT_AMBULATORY_CARE_PROVIDER_SITE_OTHER): Payer: Medicare Other | Admitting: Nurse Practitioner

## 2015-10-08 ENCOUNTER — Encounter: Payer: Self-pay | Admitting: Nurse Practitioner

## 2015-10-08 VITALS — BP 138/77 | HR 50 | Ht 70.0 in | Wt 199.0 lb

## 2015-10-08 DIAGNOSIS — G6289 Other specified polyneuropathies: Secondary | ICD-10-CM | POA: Diagnosis not present

## 2015-10-08 DIAGNOSIS — G4733 Obstructive sleep apnea (adult) (pediatric): Secondary | ICD-10-CM

## 2015-10-08 DIAGNOSIS — I25810 Atherosclerosis of coronary artery bypass graft(s) without angina pectoris: Secondary | ICD-10-CM

## 2015-10-08 DIAGNOSIS — R972 Elevated prostate specific antigen [PSA]: Secondary | ICD-10-CM | POA: Diagnosis not present

## 2015-10-08 NOTE — Patient Instructions (Signed)
CPAP compliance excellent  F/U in 6 to 8 months

## 2015-10-08 NOTE — Progress Notes (Signed)
I agree with the assessment and plan as directed by NP .The patient is known to me .   Rogan Ecklund, MD  

## 2015-10-08 NOTE — Progress Notes (Signed)
[facility]  PATIENT: Derrick [name] DOB: 11/08/1941   REASON FOR VISIT: Follow-up for obstructive sleep apnea CPAP, neuropathy associated with MGUS HISTORY FROM: Patient    HISTORY OF PRESENT ILLNESS: HISTORY: Derrick [name] is a 74 y.o. male , caucasian of french, scott -irish and native [place] ( [name]) decent . Is seen here as a revisit from Dr. [name] . His PCP is Dr. Ronnald Ramp , his ophthalmologist is Dr. [name].  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 He had been for at least 12 years CPAP treatment at a pressure of 16 cm water, he was also using a full face mask at the time , which was comfortable.  His original diagnostic sleep study took place in 2000. The patient endorsed Epworth sleepiness score at the of 24 points in the FSS at an elevated rate of 42 points as well. His BMI at the time was 30.  Meanwhile he had lost 65 pounds. Derrick [name] underwent a sleep study on 11-30-2011. His AHI at time of study was 42.1 and his RDI 45.5 his REM AHI was 30 and non-REM 42.9 he did not have any supine sleep. He had oxygen desaturations to a nadir of 82% and had a total desaturation time of 52.6 minutes with a the first 3 hours of sleep.The results were indicative of severe sleep apnea and the patient was titrated to CPAP on 12-05-2011 at the pressure of only 8 cm. He had REM rebound at this setting ,the residual AHI in the night was 0.3 per hour. He also recommended the use of a nasal pillow of possible. The CPAP was download it in 2013 after 90 days of CPAP he was with a new pressure. He had 100% compliance rate and residual AHI of 3.4 with only moderate to mild air leak that pressure was 8 cm water. There are peak time per day was 8 hours and 48 minutes. CPAP  still set at 8 cm water with 3 cm EPR.  The air leak has slightly increased the residual AHI is 2.2, even better than last year. The therapeutic time is 8 hour and 53 minutes. The patient has 100% compliance. Today he endorsed Epworth sleepiness score at 0 point and the fatigue severity score at only 15 points.  Geriatric depression score was endorsed at he only scored 1 point not suggestive of clinical depression. Interval history ; Derrick [name] has also a monoclonal gammopathy and has been treated with rituximab the medication had continuously improved his exercise tolerance, finally he was able to walk 30-40 minutes a day.  He has been exercising daily.  He did develop not neuropathic pain, but some and knee pain on the right and an He has comMRI was now showing DDD on the correspondin spinal level. Completed his rituximab treatment with Dr. [name]. in February 2014 .He underwent a total of 3 steroid injections at the L5-S1 distribution which had meanwhile shown a benefit.  He has been on gabapentin for neuropathic pain, seems to be related to his MGUS diagnosis .He had developed a severe cold intolerance progressively actually the last year. He has noticed since March 2014 and since his Retuximab treatment was completed, that he has some cold cramping as well and toe cramps, pain and numbness.  Last Time I ordered NCS and EMG.  Dr Jannifer Franklin did a EMG and NCS, and found axonal damage , not a finding of CIDP. This was after 3 years of MGUS treatment and I answered his questions, "did I ever have CIDP'? I believe ,yes. 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  The patient has hp pain but is ambulatory, he periodically loses control of his foot lifting muscles - stretching exercises help. As to his obstructive sleep apnea, there is no need for any  adjustment at this time. His current settings are comfortable and has residual AHI is very low. In office download confirmed high compliance 02-13-14 . He is extremely intolerant to cold temperature, and has anhydrosis - this is more often seen in dysautonomia with NP. He was identified as axonal neuropathy by Dr. Jannifer Franklin. I would like to follow the patient once a year for the CPAP exclusively for sleep - RV in 12 month . The CPAP compliance report for 10-16-14 reveals a AHI of 1.2 and daily user time of 9 hours and 14 minutes, and 100% over 4 hour compliance over 4 hours of nightly use.  He has spinal stenosis - 4 times daily 400 mg neurontin.  A revisit will be scheduled in 12 months. Labs , CBC and Diff by oncologist. Cold agglutinin ordered. Plasma -Electrophoresis reordered.   03-26-15  Interval history Derrick [name] reports that he was recently babysitting the grandchildren in [place] and has noticed that he has less stamina less strength especially in his proximal leg muscles, and he was just yesterday told that he had an abnormal cardiac stress test. He has known coronary artery disease and a stent in the right main coronary artery he was now diagnosed with coronary artery disease in the left main. Catheterization is planned for Monday. As to his obstructive sleep apnea he is doing very well his compliance report from 03-26-15 shows 97% compliance for days of use and for 4 hours of use the average therapy time is 8 hours and 39 minutes at 9 cm water pressure with 2 cm EPR, residual AHI is 1.7. Fatigue severity score is 23 and Epworth sleepiness score is 1. the patient recently has had some desire to take some daytime naps which she has not had for a long time or ever. Back from [place] , he noted a black toe nail, without history of trauma. Was this related to blood clot? Its only the nail , not the whole toe. It is tender to touch. He has an axonal neuropathy. MGUS, will refer to  Podiatrist. UPDATE 10/08/15 Derrick [name] returns for followup after last visit with Dr. Brett Fairy 03/26/2015. He has a history of obstructive sleep apnea and his compliance is 100% for 30 days usage. Average usage is 9 hours 38 minutes at 9 cm of water pressure with 2 EPR level and residual AHI 1.6. ESS score is 0 and fatigue severity score is 22. His neuropathy with MGUS is followed by Dr. Alvy Bimler. He returns for reevaluation  REVIEW OF SYSTEMS: Full 14 system review of systems performed and notable only for those listed, all others are neg:  Constitutional: neg  Cardiovascular: neg Ear/Nose/Throat: ringing in the ears  Skin: neg Eyes: blurred vision Respiratory: neg Gastroitestinal: neg  Hematology/Lymphatic: neg  Endocrine: neg Musculoskeletal:joint pain, aching muscles Allergy/Immunology: neg Neurological: numbness in the feet Psychiatric: neg Sleep : neg   ALLERGIES: No Known Allergies  HOME MEDICATIONS: Outpatient Prescriptions Prior to Visit  Medication Sig Dispense Refill  . albuterol (PROVENTIL HFA) 108 (90 BASE) MCG/ACT inhaler Inhale 2 puffs into the lungs every 6 (six) hours as needed. 18 g 1  . amLODipine (NORVASC) 10 MG tablet TAKE ONE TABLET BY MOUTH ONCE DAILY 90 tablet 0  . aspirin 81 MG tablet Take 81 mg by mouth daily.      [name] atorvastatin (LIPITOR) 20 MG tablet TAKE ONE TABLET BY MOUTH ONCE DAILY 90 tablet 3  . bimatoprost (LUMIGAN) 0.01 % SOLN Place 1 drop into both eyes at bedtime.    . budesonide (PULMICORT) 180 MCG/ACT inhaler Inhale 2 puffs into the lungs 2 (two) times daily. 3 each 3  . celecoxib (CELEBREX) 200 MG capsule TAKE ONE CAPSULE BY MOUTH ONCE DAILY 90 capsule 0  . CHOLECALCIFEROL PO Take 1,000 mg by mouth daily. Vitamin D 3    . clopidogrel (PLAVIX) 75 MG tablet TAKE ONE TABLET BY MOUTH ONCE DAILY 90 tablet 3  . finasteride (PROSCAR) 5 MG tablet Take 5 mg by mouth daily.     . fluticasone (FLONASE) 50 MCG/ACT nasal spray Place 2 sprays into both  nostrils daily. 48 g 3  . gabapentin (NEURONTIN) 800 MG tablet TAKE ONE TABLET BY MOUTH FOUR TIMES DAILY 360 tablet 2  . Multiple Vitamin (MULTIVITAMIN) tablet Take 1 tablet by mouth daily.      . pantoprazole (PROTONIX) 40 MG tablet TAKE ONE TABLET BY MOUTH ONCE DAILY 90 tablet 3  . tadalafil (CIALIS) 5 MG tablet Take 5 mg by mouth daily.    [name] triamterene-hydrochlorothiazide (MAXZIDE-25) 37.5-25 MG per tablet TAKE ONE TABLET BY MOUTH ONCE DAILY 90 tablet 2  . dorzolamide (TRUSOPT) 2 % ophthalmic solution Place 1 drop into both eyes daily.      No facility-administered medications prior to visit.    PAST MEDICAL HISTORY: Past Medical History  Diagnosis Date  . BENIGN PROSTATIC HYPERTROPHY, WITH OBSTRUCTION 05/28/2010  . COLONIC POLYPS, ADENOMATOUS, HX OF 05/28/2010  . GENERALIZED OSTEOARTHROSIS UNSPECIFIED SITE 05/28/2010  . GERD 05/28/2010  . HYPERLIPIDEMIA 05/28/2010  . Intrinsic asthma, 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  . Malignant neoplasm of descending colon ([place]) 05/28/2010  . MITRAL VALVE PROLAPSE 05/28/2010  . 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 PALPITATIONS, CHRONIC 05/28/2010  . SLEEP APNEA, OBSTRUCTIVE, MODERATE 05/28/2010  . UNSPECIFIED INFLAMMATORY AND TOXIC NEUROPATHY 05/28/2010  . VITAMIN D DEFICIENCY 05/28/2010  . CAD (coronary artery disease)     a. s/p NSTEMI 06/01/11: DES to RCA;  b. cath 06/25/11:   dLM 50-60% (FFR 0.87), prox to mid LAD 40-50%, D1 50%, pCFX 50%, RCA stent ok, dPDA 80%, EF 55-60%.  His FFR was felt to be negative and therefore medical therapy was recommended ;  echo 6/12: EF 55-60%, mild AS   . 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 Coronary artery disease 12/11/2011  . MI (myocardial infarction) ([place]) 12/11/2011  . Vision loss   . 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 Sarcoidosis ([name])     pulmonalis  . Hypertension   . Parotid gland pain     infection  . 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 COPD (chronic obstructive  pulmonary disease) ([place])   . Diverticulitis   . Colon cancer ([place]) 2000  . Pneumonia   . Sleep apnea   . 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 Tonsillectomy  1950  .  Colon surgery  2000    decending colon 200  . Lung wedge  2001  . Appendectomy  1954  . Knee surgery  2003  . Heart attack  06/03/11  . Cardiac catheterization  06/03/11    stent  . Cataract extraction    . Left heart catheterization with coronary angiogram N/A 03/31/2015    Procedure: LEFT HEART CATHETERIZATION WITH CORONARY ANGIOGRAM;  Surgeon: [name], MD;  Location[facility] CATH LAB;  Service: Cardiovascular;  Laterality: N/A;    FAMILY HISTORY: Family History  Problem Relation Age of Onset  . Arthritis Mother     severe  . Coronary artery disease Mother   . Coronary artery disease Brother   . Cancer Brother     choriocarcinoma  . Colon polyps Mother     SOCIAL HISTORY: Social History   Social History  . Marital Status: Married    Spouse Name: N/A  . Number of Children: 2  . Years of Education: MS-Coll.   Occupational History  . Consultant     Chemical Engineer-former   Social History Main Topics  . Smoking status: Never Smoker   . Smokeless tobacco: Never Used  . Alcohol Use: 0.0 oz/week    0 Standard drinks or equivalent per week     Comment: Occassionally  . Drug Use: No  . Sexual Activity: Yes    Birth Control/ Protection: None   Other Topics Concern  . Not on file   Social History Narrative   College- [employer]; [employer]-environment science and mgt. Married [name])  "16" 1 son- 27; 1 dtr "59" 2 grandchildren. 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 End of life-provided discussive context and provided packet.     PHYSICAL EXAM  Filed Vitals:   10/08/15 1333  BP: 138/77  Pulse: 50  Height: 5\' 10"  (1.778 m)  Weight: 199 lb (90.266 kg)   Body mass index is 28.55 kg/(m^2). General: The  patient is awake, alert and appears not in acute distress. The patient is well groomed. Head: Normocephalic, atraumatic.  Neck is supple.Neck size 15 Mallampati 1,   Cardiovascular: Regular rate and rhythm, without murmurs  Respiratory: Lungs are clear to auscultation. Skin: Without evidence of edema, or rash Neurologic exam : The patient is awake and alert, oriented to place and time. Memory subjective described as intact. There is a normal attention span & concentration ability. Speech is fluent without dysarthria, dysphonia or aphasia. Mood and affect are appropriate. Cranial nerves:Pupils are equal and briskly reactive to light. Funduscopic exam without evidence of pallor or edema. Extraocular movements in vertical and horizontal planes intact and without nystagmus. Visual fields by finger perimetry are intact.Hearing to finger rub intact. Facial sensation intact to fine touch. Facial motor strength is symmetric and tongue and uvula move midline. Motor exam: Normal tone and normal muscle bulk and symmetric normal strength in all extremities. Good grip strength is noted. He has a foot drop on the right foot, that is progressed since 2013.  Sensory: Fine touch, pinprick and vibration were tested in all extremities.   There is a slight decrease of vibratory sense at the ankle level, but the patient reports numbness - Coordination: Rapid alternating movements in the fingers/hands is tested and normal. Finger-to-nose maneuver tested and normal without evidence of ataxia, dysmetria or tremor. Gait and station: Patient walks without assistive device and is able and assisted stool climb up to the exam table. Strength within normal limits.  Stance is stable and normal. Tandem gait  is fragmented, he needs to look where he steps, lost his " sense of where in space " he is . Romberg testing is positive. Deep tendon reflexes: in the upper and lower extremities are attenuated ,Babinski  maneuver response is downgoing.    DIAGNOSTIC DATA (LABS, IMAGING, TESTING) - I reviewed patient records, labs, notes, testing and imaging myself where available.  Lab Results  Component Value Date   WBC 4.0 04/28/2015   HGB 13.4 04/28/2015   HCT 39.5 04/28/2015   MCV 89.9 04/28/2015   PLT 158 04/28/2015      Component Value Date/Time   NA 140 04/28/2015 0822   NA 139 03/26/2015 0809   K 4.1 04/28/2015 0822   K 3.9 03/26/2015 0809   CL 103 03/26/2015 0809   CL 104 03/23/2013 0855   CO2 29 04/28/2015 0822   CO2 33* 03/26/2015 0809   GLUCOSE 102 04/28/2015 0822   GLUCOSE 100* 03/26/2015 0809   GLUCOSE 98 03/23/2013 0855   BUN 17.9 04/28/2015 0822   BUN 20 03/26/2015 0809   CREATININE 0.8 04/28/2015 0822   CREATININE 0.99 03/26/2015 0809   CREATININE 0.87 07/30/2011 0733   CALCIUM 9.8 04/28/2015 0822   CALCIUM 10.6* 03/26/2015 0809   PROT 6.7 04/28/2015 0822   PROT 7.3 02/06/2015 1025   ALBUMIN 4.1 04/28/2015 0822   ALBUMIN 4.7 02/06/2015 1025   AST 21 04/28/2015 0822   AST 19 02/06/2015 1025   ALT 22 04/28/2015 0822   ALT 19 02/06/2015 1025   ALKPHOS 53 04/28/2015 0822   ALKPHOS 57 02/06/2015 1025   BILITOT 0.89 04/28/2015 0822   BILITOT 0.7 02/06/2015 1025   GFRNONAA >60 06/05/2011 0350   GFRAA >60 06/05/2011 0350   Lab Results  Component Value Date   CHOL 171 02/06/2015   HDL 62.80 02/06/2015   LDLCALC 96 02/06/2015   TRIG 62.0 02/06/2015   CHOLHDL 3 02/06/2015    Lab Results  Component Value Date   TSH 2.46 02/06/2015      ASSESSMENT AND PLAN  74 y.o. year old male  here with to follow-up for his obstructive sleep apnea with CPAP.His compliance is 100% for 30 days usage. Average usage is 9 hours 38 minutes at 9 cm of water pressure with 2 EPR level and residual AHI 1.6. ESS score is 0 and fatigue severity score is 22  CPAP compliance excellent  Continue to walk every day for overall health and well-being F/U in 6 to 8 months Dennie Bible, [facility], [facility], [facility] [address] [place], [place] [address] [phone]

## 2015-10-09 LAB — PSA
PSA: 3.42
PSA: 3.42

## 2015-10-15 ENCOUNTER — Other Ambulatory Visit: Payer: Self-pay | Admitting: Cardiovascular Disease

## 2015-10-15 DIAGNOSIS — N401 Enlarged prostate with lower urinary tract symptoms: Secondary | ICD-10-CM | POA: Diagnosis not present

## 2015-10-15 DIAGNOSIS — R3916 Straining to void: Secondary | ICD-10-CM | POA: Diagnosis not present

## 2015-10-15 DIAGNOSIS — R972 Elevated prostate specific antigen [PSA]: Secondary | ICD-10-CM | POA: Diagnosis not present

## 2015-10-15 DIAGNOSIS — N5201 Erectile dysfunction due to arterial insufficiency: Secondary | ICD-10-CM | POA: Diagnosis not present

## 2015-10-17 ENCOUNTER — Encounter: Payer: Self-pay | Admitting: Cardiovascular Disease

## 2015-10-17 ENCOUNTER — Ambulatory Visit (INDEPENDENT_AMBULATORY_CARE_PROVIDER_SITE_OTHER): Payer: Medicare Other | Admitting: Cardiovascular Disease

## 2015-10-17 VITALS — BP 120/60 | HR 53 | Ht 70.0 in | Wt 200.2 lb

## 2015-10-17 DIAGNOSIS — I2510 Atherosclerotic heart disease of native coronary artery without angina pectoris: Secondary | ICD-10-CM

## 2015-10-17 DIAGNOSIS — I25810 Atherosclerosis of coronary artery bypass graft(s) without angina pectoris: Secondary | ICD-10-CM

## 2015-10-17 DIAGNOSIS — E785 Hyperlipidemia, unspecified: Secondary | ICD-10-CM

## 2015-10-17 DIAGNOSIS — I10 Essential (primary) hypertension: Secondary | ICD-10-CM | POA: Diagnosis not present

## 2015-10-17 NOTE — Patient Instructions (Signed)
Medication Instructions:  Your physician recommends that you continue on your current medications as directed. Please refer to the Current Medication list given to you today.  Labwork: Your physician recommends that you return for a FASTING LIPID and LIVER in 6 MONTHS--nothing to eat or drink after midnight, lab opens at 7:30 AM  Testing/Procedures: No new orders.   Follow-Up: Your physician wants you to follow-up in: 6 MONTHS with Dr Burt Knack.  You will receive a reminder letter in the mail two months in advance. If you don't receive a letter, please call our office to schedule the follow-up appointment.   Any Other Special Instructions Will Be Listed Below (If Applicable).

## 2015-10-19 ENCOUNTER — Encounter: Payer: Self-pay | Admitting: Cardiovascular Disease

## 2015-10-19 NOTE — Progress Notes (Signed)
Cardiology Office Note Date:  10/19/2015   ID:  Derrick [name], DOB 27-Jul-1941, MRN [id number]  PCP:  [name], MD  Cardiologist:  [name], MD    No chief complaint on file.    History of Present Illness: Derrick [name] is a 74 y.o. male who presents for follow-up evaluation. He has been followed for coronary artery disease. He has a history of myocardial infarction and drug-eluting stent placement in the right coronary artery. 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 He's been managed medically. He has multiple noncardiac problems including CIDP, pulmonary sarcoid, and monoclonal gammopathy of undetermined significance. He underwent cardiac catheterization in April 2016 showing stable coronary anatomy.   The patient is doing relatively well. He is limited by back problems. Today, he denies symptoms of palpitations, chest pain, shortness of breath, orthopnea, PND, lower extremity edema, dizziness, or syncope.   Past Medical History  Diagnosis Date  . BENIGN PROSTATIC HYPERTROPHY, WITH OBSTRUCTION 05/28/2010  . COLONIC POLYPS, ADENOMATOUS, HX OF 05/28/2010  . GENERALIZED OSTEOARTHROSIS UNSPECIFIED SITE 05/28/2010  . GERD 05/28/2010  . HYPERLIPIDEMIA 05/28/2010  . Intrinsic asthma, 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  . Malignant neoplasm of descending colon ([place]) 05/28/2010  . MITRAL VALVE PROLAPSE 05/28/2010  . 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 PALPITATIONS, CHRONIC 05/28/2010  . SLEEP APNEA, OBSTRUCTIVE, MODERATE 05/28/2010  . UNSPECIFIED INFLAMMATORY AND TOXIC NEUROPATHY 05/28/2010  . VITAMIN D DEFICIENCY 05/28/2010  . CAD (coronary artery disease)     a. s/p NSTEMI 06/01/11: DES to RCA;  b. cath 06/25/11:   dLM 50-60% (FFR 0.87), prox to mid LAD 40-50%, D1 50%, pCFX 50%, RCA stent ok, dPDA 80%, EF 55-60%.  His FFR was felt to be negative and therefore medical therapy was recommended ;  echo 6/12: EF 55-60%, mild AS   . Monoclonal gammopathy of  undetermined significance 12/11/2011  . Coronary artery disease 12/11/2011  . MI (myocardial infarction) ([place]) 12/11/2011  . Vision loss   . CIDP (chronic inflammatory demyelinating polyneuropathy) ([place]) 04/11/2012  . Sarcoidosis ([place])     pulmonalis  . Hypertension   . Parotid gland pain     infection  . 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 COPD (chronic obstructive pulmonary disease) ([place])   . Diverticulitis   . Colon cancer ([place]) 2000  . Pneumonia   . Sleep apnea   . 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 Tonsillectomy  1950  . Colon surgery  2000    decending colon 200  . Lung wedge  2001  . Appendectomy  1954  . Knee surgery  2003  . Heart attack  06/03/11  . Cardiac catheterization  06/03/11    stent  . Cataract extraction    . Left heart catheterization with coronary angiogram N/A 03/31/2015    Procedure: LEFT HEART CATHETERIZATION WITH CORONARY ANGIOGRAM;  Surgeon: [name], MD;  Location: [facility] CATH LAB;  Service: Cardiovascular;  Laterality: N/A;    Current Outpatient Prescriptions  Medication Sig Dispense Refill  . albuterol (PROVENTIL HFA;VENTOLIN HFA) 108 (90 BASE) MCG/ACT inhaler Inhale 2 puffs into the lungs every 6 (six) hours as needed for wheezing or shortness of breath.    [name] amLODipine (NORVASC) 10 MG tablet TAKE ONE TABLET BY MOUTH ONCE DAILY 90 tablet 0  . aspirin 81 MG tablet Take 81 mg by mouth daily.      [name] atorvastatin (LIPITOR) 20 MG  tablet TAKE ONE TABLET BY MOUTH ONCE DAILY 90 tablet 3  . bimatoprost (LUMIGAN) 0.01 % SOLN Place 1 drop into both eyes at bedtime.    . budesonide (PULMICORT) 180 MCG/ACT inhaler Inhale 2 puffs into the lungs 2 (two) times daily. 3 each 3  . celecoxib (CELEBREX) 200 MG capsule TAKE ONE CAPSULE BY MOUTH ONCE DAILY 90 capsule 0  . CHOLECALCIFEROL PO Take 1,000 mg by mouth daily. Vitamin D 3    . clopidogrel (PLAVIX) 75 MG tablet TAKE ONE TABLET BY MOUTH ONCE DAILY 90  tablet 0  . dorzolamide-timolol (COSOPT) 22.3-6.8 MG/ML ophthalmic solution Place 1 drop into both eyes 2 (two) times daily.     . finasteride (PROSCAR) 5 MG tablet Take 5 mg by mouth daily.     . fluticasone (FLONASE) 50 MCG/ACT nasal spray Place 2 sprays into both nostrils daily. 48 g 3  . gabapentin (NEURONTIN) 800 MG tablet TAKE ONE TABLET BY MOUTH FOUR TIMES DAILY 360 tablet 2  . Multiple Vitamin (MULTIVITAMIN) tablet Take 1 tablet by mouth daily.      . pantoprazole (PROTONIX) 40 MG tablet TAKE ONE TABLET BY MOUTH ONCE DAILY 90 tablet 3  . tadalafil (CIALIS) 5 MG tablet Take 5 mg by mouth daily.    [name] triamterene-hydrochlorothiazide (MAXZIDE-25) 37.5-25 MG per tablet TAKE ONE TABLET BY MOUTH ONCE DAILY 90 tablet 2   No current facility-administered medications for this visit.    Allergies:   Review of patient's allergies indicates no known allergies.   Social History:  The patient  reports that he has never smoked. He has never used smokeless tobacco. He reports that he drinks alcohol. He reports that he does not use illicit drugs.   Family History:  The patient's family history includes Arthritis in his mother; Cancer in his brother; Colon polyps in his mother; Coronary artery disease in his brother and mother.    ROS:  Please see the history of present illness.  Otherwise, review of systems is positive for recent chills back pain, easy bruising, leg pain.  All other systems are reviewed and negative.    PHYSICAL EXAM: VS:  BP 120/60 mmHg  Pulse 53  Ht 5\' 10"  (1.778 m)  Wt 200 lb 3.2 oz (90.81 kg)  BMI 28.73 kg/m2 , BMI Body mass index is 28.73 kg/(m^2). GEN: Well nourished, well developed, in no acute distress HEENT: normal Neck: no JVD, no masses. No carotid bruits Cardiac: RRR with 2/6 SEM at the RUSB              Respiratory:  clear to auscultation bilaterally, normal work of breathing GI: soft, nontender, nondistended, + BS MS: no deformity or atrophy Ext: no pretibial  edema, pedal pulses 2+= bilaterally Skin: warm and dry, no rash Neuro:  Strength and sensation are intact Psych: euthymic mood, full affect  EKG:  EKG is ordered today. The ekg ordered today shows sinus brady with first degree AV block, HR 53 bpm  Recent Labs: 02/06/2015: TSH 2.46 04/28/2015: ALT 22; BUN 17.9; Creatinine 0.8; HGB 13.4; Platelets 158; Potassium 4.1; Sodium 140   Lipid Panel     Component Value Date/Time   CHOL 171 02/06/2015 1025   TRIG 62.0 02/06/2015 1025   HDL 62.80 02/06/2015 1025   CHOLHDL 3 02/06/2015 1025   VLDL 12.4 02/06/2015 1025   LDLCALC 96 02/06/2015 1025      Wt Readings from Last 3 Encounters:  10/17/15 200 lb 3.2 oz (90.81 kg)  10/08/15  199 lb (90.266 kg)  05/05/15 194 lb 14.4 oz (88.406 kg)     Cardiac Studies Reviewed: Cath 03/31/2015: Left mainstem: The left mainstem is patent. The proximal and mid left main have no significant obstruction. The distal left main has 60% stenosis without significant change from previous studies. The vessel divides into the LAD and left circumflex.  Left anterior descending (LAD): The LAD is heavily calcified. The proximal LAD has 60% heavy calcific stenosis. This involves the ostium of the vessel. The diagonal branches are small to medium in caliber. The ostium of the first diagonal has 80% stenosis. The second and third diagonals have no significant disease. The mid LAD is diffusely diseased with 50% stenosis. The LAD wraps the LV apex.  Left circumflex (LCx): The left circumflex is also calcified. The vessel begins with an acute angulation from the left main. The OM branches are patent. The proximal circumflex has 40-50% stenosis.  Right coronary artery (RCA): The RCA is dominant. The vessel is moderately calcified. The stented segment through the proximal and mid vessel is widely patent without significant stenosis. The ostium of the RCA has 20-30% stenosis. The distal RCA is diffusely diseased with 30-40%  stenosis. The PLA branch is patent. The PDA branch has a 70% stenosis in its midportion. The vessel is fairly small through this region.  Left ventriculography: Left ventricular systolic function is normal, LVEF is estimated at 55-65%, there is no significant mitral regurgitation   Contrast: 70 cc Omnipaque  Estimated Blood Loss: Minimal  Final Conclusions:  1. Moderate distal left mainstem disease, without significant progression from the patient's 2012 cardiac catheterization study 2. Mild to moderate diffusely calcified stenosis of the LAD 3. Mild nonobstructive stenosis of the left circumflex 4. Continued stent patency of the RCA with moderate PDA stenosis 5. Preserved LV systolic function  Recommendations: I have carefully reviewed the patients cardiac catheterization study and compared it to his previous study from 2012. While his stress test was abnormal, he is not limited by symptoms of angina. I would recommend ongoing medical therapy as his only other option for treatment would be multivessel CABG. Certainly, if he has progressive symptoms, cardiac surgery could be considered  ASSESSMENT AND PLAN: 1.  CAD, native vessel: no angina. Recent cath reviewed and the patient appears stable. Will continue current medical therapy.  2. Essential HTN: BP controlled on current medications  3. Hyperlipidemia: on statin drug. Last labs reviewed.   Current medicines are reviewed with the patient today.  The patient does not have concerns regarding medicines.  Labs/ tests ordered today include:  Orders Placed This Encounter  Procedures  . Lipid panel  . Hepatic function panel  . EKG 12-Lead    Disposition:   FU 6 months  Signed, [name], MD  10/19/2015 7:31 PM    [address], [place], [place]  [address] Phone: [phone]; Fax: [phone]

## 2015-10-20 ENCOUNTER — Other Ambulatory Visit: Payer: Medicare Other

## 2015-10-20 ENCOUNTER — Ambulatory Visit (HOSPITAL_COMMUNITY)
Admission: RE | Admit: 2015-10-20 | Discharge: 2015-10-20 | Disposition: A | Discharge status: Home / Self Care | Payer: Medicare Other | Source: Ambulatory Visit | Attending: Hematology and Oncology | Admitting: Hematology and Oncology

## 2015-10-20 ENCOUNTER — Ambulatory Visit: Payer: Medicare Other | Admitting: Cardiovascular Disease

## 2015-10-20 DIAGNOSIS — D869 Sarcoidosis, unspecified: Secondary | ICD-10-CM | POA: Diagnosis not present

## 2015-10-20 DIAGNOSIS — D472 Monoclonal gammopathy: Secondary | ICD-10-CM | POA: Insufficient documentation

## 2015-10-20 DIAGNOSIS — I6521 Occlusion and stenosis of right carotid artery: Secondary | ICD-10-CM | POA: Insufficient documentation

## 2015-10-20 DIAGNOSIS — C9000 Multiple myeloma not having achieved remission: Secondary | ICD-10-CM | POA: Diagnosis not present

## 2015-10-20 DIAGNOSIS — M47814 Spondylosis without myelopathy or radiculopathy, thoracic region: Secondary | ICD-10-CM | POA: Diagnosis not present

## 2015-10-20 LAB — LACTATE DEHYDROGENASE (CC13): LDH: 208 U/L (ref 125–245)

## 2015-10-20 LAB — CBC WITH DIFFERENTIAL/PLATELET
BASO%: 0.7 % (ref 0.0–2.0)
Basophils Absolute: 0 10*3/uL (ref 0.0–0.1)
EOS%: 6.7 % (ref 0.0–7.0)
Eosinophils Absolute: 0.2 10*3/uL (ref 0.0–0.5)
HCT: 41.5 % (ref 38.4–49.9)
HGB: 14.2 g/dL (ref 13.0–17.1)
LYMPH%: 21.1 % (ref 14.0–49.0)
MCH: 30.9 pg (ref 27.2–33.4)
MCHC: 34.1 g/dL (ref 32.0–36.0)
MCV: 90.6 fL (ref 79.3–98.0)
MONO#: 0.4 10*3/uL (ref 0.1–0.9)
MONO%: 13 % (ref 0.0–14.0)
NEUT#: 2 10*3/uL (ref 1.5–6.5)
NEUT%: 58.5 % (ref 39.0–75.0)
Platelets: 165 10*3/uL (ref 140–400)
RBC: 4.57 10*6/uL (ref 4.20–5.82)
RDW: 13.4 % (ref 11.0–14.6)
WBC: 3.4 10*3/uL — ABNORMAL LOW (ref 4.0–10.3)
lymph#: 0.7 10*3/uL — ABNORMAL LOW (ref 0.9–3.3)

## 2015-10-20 LAB — COMPREHENSIVE METABOLIC PANEL (CC13)
ALT: 27 U/L (ref 0–55)
AST: 30 U/L (ref 5–34)
Albumin: 4.3 g/dL (ref 3.5–5.0)
Alkaline Phosphatase: 54 U/L (ref 40–150)
Anion Gap: 7 mEq/L (ref 3–11)
BUN: 15.7 mg/dL (ref 7.0–26.0)
CO2: 29 mEq/L (ref 22–29)
Calcium: 10.2 mg/dL (ref 8.4–10.4)
Chloride: 105 mEq/L (ref 98–109)
Creatinine: 1 mg/dL (ref 0.7–1.3)
EGFR: 71 mL/min/{1.73_m2} — ABNORMAL LOW (ref >90)
Glucose: 131 mg/dl (ref 70–140)
Potassium: 4 mEq/L (ref 3.5–5.1)
Sodium: 141 mEq/L (ref 136–145)
Total Bilirubin: 0.8 mg/dL (ref 0.20–1.20)
Total Protein: 7 g/dL (ref 6.4–8.3)

## 2015-10-21 DIAGNOSIS — M47812 Spondylosis without myelopathy or radiculopathy, cervical region: Secondary | ICD-10-CM | POA: Diagnosis not present

## 2015-10-22 LAB — KAPPA/LAMBDA LIGHT CHAINS
Kappa free light chain: 2.54 mg/dL — ABNORMAL HIGH (ref 0.33–1.94)
Kappa:Lambda Ratio: 1.54 (ref 0.26–1.65)
Lambda Free Lght Chn: 1.65 mg/dL (ref 0.57–2.63)

## 2015-10-22 LAB — SPEP & IFE WITH QIG
Abnormal Protein Band1: 0.3 g/dL
Albumin ELP: 4.5 g/dL (ref 3.8–4.8)
Alpha-1-Globulin: 0.3 g/dL (ref 0.2–0.3)
Alpha-2-Globulin: 0.7 g/dL (ref 0.5–0.9)
Beta 2: 0.3 g/dL (ref 0.2–0.5)
Beta Globulin: 0.4 g/dL (ref 0.4–0.6)
Gamma Globulin: 0.9 g/dL (ref 0.8–1.7)
IgA: 163 mg/dL (ref 68–379)
IgG (Immunoglobin G), Serum: 830 mg/dL (ref 650–1600)
IgM, Serum: 295 mg/dL — ABNORMAL HIGH (ref 41–251)
Total Protein, Serum Electrophoresis: 7.1 g/dL (ref 6.1–8.1)

## 2015-10-22 LAB — SEDIMENTATION RATE: Sed Rate: 4 mm/hr (ref 0–20)

## 2015-10-22 LAB — BETA 2 MICROGLOBULIN, SERUM: Beta-2 Microglobulin: 2.41 mg/L (ref <=2.51)

## 2015-10-29 LAB — UIFE/LIGHT CHAINS/TP QN, 24-HR UR
Albumin, U: DETECTED
Time: 24 h
Total Protein, Urine: 4 mg/dL — ABNORMAL LOW (ref 5–25)
Volume, Urine: 2500 mL

## 2015-10-29 LAB — UPEP/TP, 24-HR URINE
Collection Interval: 24 hours
Total Protein, Urine/Day: 100 mg/d (ref 50–100)
Total Protein, Urine: 4 mg/dL
Total Volume, Urine: 2500 mL

## 2015-10-29 LAB — 24 HR URINE,KAPPA/LAMBDA LIGHT CHAINS
24H Urine Volume: 2500 mL/(24.h)
Measured Kappa Chain: <0.4 mg/dL
Measured Lambda Chain: <0.4 mg/dL

## 2015-11-03 ENCOUNTER — Ambulatory Visit: Payer: Medicare Other | Admitting: Hematology and Oncology

## 2015-11-13 ENCOUNTER — Telehealth: Payer: Self-pay | Admitting: Hematology and Oncology

## 2015-11-13 NOTE — Telephone Encounter (Signed)
pt called to r/s appt due to being [place] in the [place] [employer] airport....r/s pt ok and aware of new d.t

## 2015-11-14 ENCOUNTER — Ambulatory Visit: Payer: Medicare Other | Admitting: Hematology and Oncology

## 2015-12-02 ENCOUNTER — Ambulatory Visit (HOSPITAL_BASED_OUTPATIENT_CLINIC_OR_DEPARTMENT_OTHER): Payer: Medicare Other | Admitting: Hematology and Oncology

## 2015-12-02 ENCOUNTER — Telehealth: Payer: Self-pay | Admitting: Hematology and Oncology

## 2015-12-02 ENCOUNTER — Encounter: Payer: Self-pay | Admitting: Hematology and Oncology

## 2015-12-02 ENCOUNTER — Other Ambulatory Visit: Payer: Self-pay | Admitting: Internal Medicine

## 2015-12-02 VITALS — BP 117/48 | HR 51 | Temp 98.4°F | Resp 18 | Ht 70.0 in | Wt 198.9 lb

## 2015-12-02 DIAGNOSIS — D472 Monoclonal gammopathy: Secondary | ICD-10-CM | POA: Diagnosis not present

## 2015-12-02 DIAGNOSIS — G6181 Chronic inflammatory demyelinating polyneuritis: Secondary | ICD-10-CM | POA: Diagnosis not present

## 2015-12-02 DIAGNOSIS — R9439 Abnormal result of other cardiovascular function study: Secondary | ICD-10-CM

## 2015-12-02 DIAGNOSIS — M4810 Ankylosing hyperostosis [Forestier], site unspecified: Secondary | ICD-10-CM | POA: Insufficient documentation

## 2015-12-02 HISTORY — DX: Ankylosing hyperostosis (forestier), site unspecified: M48.10

## 2015-12-02 NOTE — Telephone Encounter (Signed)
Gave and printed appt sched and avs for pt for August 2017

## 2015-12-02 NOTE — Assessment & Plan Note (Signed)
This is stable. He was seen by a neurologist but not by a rheumatologist. I have no objective assessment for this. He also had polyarthritis including possibility of ankylosing spondylitis which may respond to rituximab treatment although I have no clinical experience on this. 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

## 2015-12-02 NOTE — Progress Notes (Signed)
Derrick [name] OFFICE PROGRESS NOTE  Patient Care Team: Janith Lima, MD as PCP - General (Internal Medicine) Heath Lark, MD as Consulting Physician (Hematology and Oncology)  SUMMARY OF ONCOLOGIC HISTORY:  History of CIDP and IgM monoclonal gammopathy  HISTORY OF PRESENTING ILLNESS:  Derrick [name] was transferred to my care after his prior physician has left.  I reviewed the patient's records extensive and collaborated the history with the patient. 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 He was initially evaluated and treated by a hematologist and a neurologist in [place] before he moved to [place]. 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 He had bone marrow aspirate and biopsy which showed no evidence of malignancy in his bone marrow. His neurologic condition is characterized primarily by painful distal neuropathy, feet worse than hands, and lower extremity weakness. At one point he could barely walk a block without having to stop due to pain and weakness. He was given an initial trial of Rituxan in 2010 with no significant improvement. However following reinitiation of Rituxan in April 2012 he did get a progressive improvement. He was put on a maintenance program which we have continued in this office on an every other month basis through 03/23/2013.   INTERVAL HISTORY: Please see below for problem oriented charting. Since he was last seen here, his complained of persistent numbness and pain at the lower extremities. He denies any problems with fine motor skills of his hands. He denies any new neurological deficit. 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 The patient also has diagnosis of sarcoidosis but he denies any cough or shortness  breath. He also had a remote history of colon cancer, resected, without the need for adjuvant treatment. He had screening colonoscopy in 2015. He denies any change in bowel habits. Denies any melena or hematochezia or unexplained weight loss. 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 He is wondering about amyloidosis. He continues to have intermittent chest discomfort on exertion.  REVIEW OF SYSTEMS:   Constitutional: Denies fevers, chills or abnormal weight loss Eyes: Denies blurriness of vision Ears, nose, mouth, throat, and face: Denies mucositis or sore throat Respiratory: Denies cough, dyspnea or wheezes Gastrointestinal:  Denies nausea, heartburn or change in bowel habits Skin: Denies abnormal skin rashes Lymphatics: Denies new lymphadenopathy or easy bruising Neurological:Denies numbness, tingling or new weaknesses Behavioral/Psych: Mood is stable, no new changes  All other systems were reviewed with the patient and are negative.  I have reviewed the past medical history, past surgical history, social history and family history with the patient and they are unchanged from previous note.  ALLERGIES:  has No Known Allergies.  MEDICATIONS:  Current Outpatient Prescriptions  Medication Sig Dispense Refill  . albuterol (PROVENTIL HFA;VENTOLIN HFA) 108 (90 BASE) MCG/ACT inhaler Inhale 2 puffs into the lungs every 6 (six) hours as needed for wheezing or shortness of breath.    [name] amLODipine (NORVASC) 10 MG tablet TAKE ONE TABLET BY MOUTH ONCE DAILY 90 tablet 0  . aspirin 81 MG tablet Take 81 mg by mouth daily.      [name] atorvastatin (LIPITOR) 20 MG tablet TAKE ONE TABLET BY MOUTH ONCE DAILY 90 tablet 3  . bimatoprost (  LUMIGAN) 0.01 % SOLN Place 1 drop into both eyes at bedtime.    . budesonide (PULMICORT) 180 MCG/ACT inhaler Inhale 2 puffs into the lungs 2 (two) times daily. 3 each 3  . celecoxib (CELEBREX) 200 MG capsule  TAKE ONE CAPSULE BY MOUTH ONCE DAILY 90 capsule 0  . CHOLECALCIFEROL PO Take 1,000 mg by mouth daily. Vitamin D 3    . clopidogrel (PLAVIX) 75 MG tablet TAKE ONE TABLET BY MOUTH ONCE DAILY 90 tablet 0  . dorzolamide-timolol (COSOPT) 22.3-6.8 MG/ML ophthalmic solution Place 1 drop into both eyes 2 (two) times daily.     . finasteride (PROSCAR) 5 MG tablet Take 5 mg by mouth daily.     . fluticasone (FLONASE) 50 MCG/ACT nasal spray Place 2 sprays into both nostrils daily. 48 g 3  . gabapentin (NEURONTIN) 800 MG tablet TAKE ONE TABLET BY MOUTH FOUR TIMES DAILY 360 tablet 2  . Multiple Vitamin (MULTIVITAMIN) tablet Take 1 tablet by mouth daily.      . pantoprazole (PROTONIX) 40 MG tablet TAKE ONE TABLET BY MOUTH ONCE DAILY 90 tablet 3  . tadalafil (CIALIS) 5 MG tablet Take 5 mg by mouth daily.    [name] triamterene-hydrochlorothiazide (MAXZIDE-25) 37.5-25 MG per tablet TAKE ONE TABLET BY MOUTH ONCE DAILY 90 tablet 2   No current facility-administered medications for this visit.    PHYSICAL EXAMINATION: ECOG PERFORMANCE STATUS: 1 - Symptomatic but completely ambulatory  Filed Vitals:   12/02/15 0815  BP: 117/48  Pulse: 51  Temp: 98.4 F (36.9 C)  Resp: 18   Filed Weights   12/02/15 0815  Weight: 198 lb 14.4 oz (90.22 kg)    GENERAL:alert, no distress and comfortable SKIN: skin color, texture, turgor are normal, no rashes or significant lesions EYES: normal, Conjunctiva are pink and non-injected, sclera clear Musculoskeletal:no cyanosis of digits and no clubbing  NEURO: alert & oriented x 3 with fluent speech, no focal motor/sensory deficits  LABORATORY DATA:  I have reviewed the data as listed    Component Value Date/Time   NA 141 10/20/2015 0847   NA 139 03/26/2015 0809   K 4.0 10/20/2015 0847   K 3.9 03/26/2015 0809   CL 103 03/26/2015 0809   CL 104 03/23/2013 0855   CO2 29 10/20/2015 0847   CO2 33* 03/26/2015 0809   GLUCOSE 131 10/20/2015 0847   GLUCOSE 100* 03/26/2015 0809    GLUCOSE 98 03/23/2013 0855   BUN 15.7 10/20/2015 0847   BUN 20 03/26/2015 0809   CREATININE 1.0 10/20/2015 0847   CREATININE 0.99 03/26/2015 0809   CREATININE 0.87 07/30/2011 0733   CALCIUM 10.2 10/20/2015 0847   CALCIUM 10.6* 03/26/2015 0809   PROT 7.0 10/20/2015 0847   PROT 7.3 02/06/2015 1025   ALBUMIN 4.3 10/20/2015 0847   ALBUMIN 4.7 02/06/2015 1025   AST 30 10/20/2015 0847   AST 19 02/06/2015 1025   ALT 27 10/20/2015 0847   ALT 19 02/06/2015 1025   ALKPHOS 54 10/20/2015 0847   ALKPHOS 57 02/06/2015 1025   BILITOT 0.80 10/20/2015 0847   BILITOT 0.7 02/06/2015 1025   GFRNONAA >60 06/05/2011 0350   GFRAA >60 06/05/2011 0350    No results found for: SPEP, UPEP  Lab Results  Component Value Date   WBC 3.4* 10/20/2015   NEUTROABS 2.0 10/20/2015   HGB 14.2 10/20/2015   HCT 41.5 10/20/2015   MCV 90.6 10/20/2015   PLT 165 10/20/2015      Chemistry  Component Value Date/Time   NA 141 10/20/2015 0847   NA 139 03/26/2015 0809   K 4.0 10/20/2015 0847   K 3.9 03/26/2015 0809   CL 103 03/26/2015 0809   CL 104 03/23/2013 0855   CO2 29 10/20/2015 0847   CO2 33* 03/26/2015 0809   BUN 15.7 10/20/2015 0847   BUN 20 03/26/2015 0809   CREATININE 1.0 10/20/2015 0847   CREATININE 0.99 03/26/2015 0809   CREATININE 0.87 07/30/2011 0733      Component Value Date/Time   CALCIUM 10.2 10/20/2015 0847   CALCIUM 10.6* 03/26/2015 0809   ALKPHOS 54 10/20/2015 0847   ALKPHOS 57 02/06/2015 1025   AST 30 10/20/2015 0847   AST 19 02/06/2015 1025   ALT 27 10/20/2015 0847   ALT 19 02/06/2015 1025   BILITOT 0.80 10/20/2015 0847   BILITOT 0.7 02/06/2015 1025     I reviewed a skeletal survey with the patient ASSESSMENT & PLAN:  MGUS (monoclonal gammopathy of unknown significance) IgM level is within normal limits and M spike is stable. He is not symptomatic apart from his neuropathy I recommend observation only with history, physical examination and blood work in 9  months.  CIDP (chronic inflammatory demyelinating polyneuropathy) This is stable. He was seen by a neurologist but not by a rheumatologist. I have no objective assessment for this. He also had polyarthritis including possibility of ankylosing spondylitis which may respond to rituximab treatment although I have no clinical experience on this. 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 I recommend exercise program as tolerated and discussed with him the [employer] program.   Orders Placed This Encounter  Procedures  . CBC with Differential/Platelet    Standing Status: Future     Number of Occurrences:      Standing Expiration Date: 01/05/2017  . SPEP & IFE with QIG    Standing Status: Future     Number of Occurrences:      Standing Expiration Date: 01/05/2017  . Kappa/lambda light chains    Standing Status: Future     Number of Occurrences:      Standing Expiration Date: 01/05/2017  . Beta 2 microglobulin, serum    Standing Status: Future     Number of Occurrences:      Standing Expiration Date: 01/05/2017  . Comprehensive metabolic panel    Standing Status: Future     Number of Occurrences:      Standing Expiration Date: 01/05/2017  . Ambulatory referral to Rheumatology    Referral Priority:  Routine    Referral Type:  Consultation    Referral Reason:  Specialty Services Required    Referred to Provider:  Hennie Duos, MD    Requested Specialty:  Rheumatology    Number of Visits Requested:  1   All questions were answered. The patient knows to call the clinic with any problems, questions or concerns. No barriers to learning was detected. I spent 15 minutes counseling the patient face to face. The total time spent in the appointment  was 20 minutes and more than 50% was on counseling and review of test results     [facility], [place], MD 12/02/2015 9:04 AM

## 2015-12-02 NOTE — Assessment & Plan Note (Signed)
The patient is following with cardiologist on a regular basis and is on medical management. I recommend exercise program as tolerated and discussed with him the [employer] program.

## 2015-12-02 NOTE — Assessment & Plan Note (Addendum)
IgM level is within normal limits and M spike is stable. He is not symptomatic apart from his neuropathy I recommend observation only with history, physical examination and blood work in 9 months.

## 2015-12-03 DIAGNOSIS — G5602 Carpal tunnel syndrome, left upper limb: Secondary | ICD-10-CM | POA: Diagnosis not present

## 2015-12-03 DIAGNOSIS — G5601 Carpal tunnel syndrome, right upper limb: Secondary | ICD-10-CM | POA: Diagnosis not present

## 2015-12-04 ENCOUNTER — Telehealth: Payer: Self-pay | Admitting: *Deleted

## 2015-12-04 ENCOUNTER — Other Ambulatory Visit: Payer: Self-pay | Admitting: *Deleted

## 2015-12-04 ENCOUNTER — Encounter: Payer: Self-pay | Admitting: Hematology and Oncology

## 2015-12-04 NOTE — Telephone Encounter (Signed)
Pt LVM states he contacted Dr. Melissa Noon office and they say they have not received the Referral from Dr. Alvy Bimler yet.   I placed Urgent POF for Scheduler to make referral.

## 2015-12-05 ENCOUNTER — Telehealth: Payer: Self-pay | Admitting: Hematology and Oncology

## 2015-12-05 NOTE — Telephone Encounter (Signed)
Faxed pt medical records to [phone]

## 2015-12-19 DIAGNOSIS — M1990 Unspecified osteoarthritis, unspecified site: Secondary | ICD-10-CM | POA: Diagnosis not present

## 2015-12-26 DIAGNOSIS — M5489 Other dorsalgia: Secondary | ICD-10-CM | POA: Diagnosis not present

## 2015-12-30 ENCOUNTER — Other Ambulatory Visit: Payer: Self-pay | Admitting: Neurology

## 2015-12-30 ENCOUNTER — Other Ambulatory Visit: Payer: Self-pay | Admitting: Internal Medicine

## 2015-12-31 NOTE — Telephone Encounter (Signed)
Pt called inquiring status of medication. Please call at [phone]

## 2016-01-01 ENCOUNTER — Telehealth: Payer: Self-pay | Admitting: Cardiovascular Disease

## 2016-01-01 NOTE — Telephone Encounter (Signed)
New message     Patient calling c/o hearing heartbeat in left ear - only when he bend over.

## 2016-01-01 NOTE — Telephone Encounter (Signed)
Agree; thx 

## 2016-01-01 NOTE — Telephone Encounter (Signed)
I spoke with the pt and he complains of hearing his heart beat in left ear when he bends over.  I reviewed the pt's carotid duplex from 2015 and minimal plaque bilaterally seen at that time.   The pt will continue with observation at this time and contact the office with any other questions or concerns.

## 2016-01-12 ENCOUNTER — Other Ambulatory Visit: Payer: Self-pay | Admitting: Cardiovascular Disease

## 2016-01-12 ENCOUNTER — Other Ambulatory Visit: Payer: Self-pay | Admitting: Internal Medicine

## 2016-01-13 ENCOUNTER — Encounter: Payer: Self-pay | Admitting: Internal Medicine

## 2016-01-13 ENCOUNTER — Ambulatory Visit (INDEPENDENT_AMBULATORY_CARE_PROVIDER_SITE_OTHER): Payer: Medicare Other | Admitting: Internal Medicine

## 2016-01-13 VITALS — BP 120/70 | HR 47 | Temp 98.4°F | Resp 16 | Ht 70.0 in | Wt 200.0 lb

## 2016-01-13 DIAGNOSIS — Z23 Encounter for immunization: Secondary | ICD-10-CM | POA: Diagnosis not present

## 2016-01-13 DIAGNOSIS — H6980 Other specified disorders of Eustachian tube, unspecified ear: Secondary | ICD-10-CM | POA: Insufficient documentation

## 2016-01-13 DIAGNOSIS — H6982 Other specified disorders of Eustachian tube, left ear: Secondary | ICD-10-CM

## 2016-01-13 DIAGNOSIS — J301 Allergic rhinitis due to pollen: Secondary | ICD-10-CM

## 2016-01-13 MED ORDER — FLUTICASONE PROPIONATE 50 MCG/ACT NA SUSP: NASAL | Status: DC

## 2016-01-13 MED ORDER — CETIRIZINE HCL 10 MG PO TABS: 10.0000 mg | ORAL_TABLET | Freq: Every day | ORAL | Status: DC

## 2016-01-13 MED ORDER — METHYLPREDNISOLONE ACETATE 80 MG/ML IJ SUSP
120.0000 mg | Freq: Once | INTRAMUSCULAR | Status: AC
  Administered 2016-01-13: 120 mg via INTRAMUSCULAR

## 2016-01-13 NOTE — Patient Instructions (Signed)

## 2016-01-13 NOTE — Progress Notes (Signed)
Pre visit review using our clinic review tool, if applicable. No additional management support is needed unless otherwise documented below in the visit note. 

## 2016-01-14 ENCOUNTER — Telehealth: Payer: Self-pay | Admitting: *Deleted

## 2016-01-14 DIAGNOSIS — H6982 Other specified disorders of Eustachian tube, left ear: Secondary | ICD-10-CM

## 2016-01-14 DIAGNOSIS — J301 Allergic rhinitis due to pollen: Secondary | ICD-10-CM

## 2016-01-14 MED ORDER — FLUTICASONE PROPIONATE 50 MCG/ACT NA SUSP: NASAL | Status: DC

## 2016-01-14 NOTE — Telephone Encounter (Signed)
Received call pt states saw md yesterday would like a 90 day on the generic flonase can get cheaper. Inform will resend to [facility]...Johny Chess

## 2016-01-15 NOTE — Progress Notes (Signed)
Subjective:  Patient ID: Derrick [name], male    DOB: [birth year]  Age: 75 y.o. MRN: [id number]  CC: Allergic Rhinitis    HPI Ermias Tomeo presents for a several week history of a swishing sensation in his left ear with popping and crackling. He also complains of sneezing, nasal congestion, postnasal drip, and runny nose. He has been using fluticasone without much relief from his symptoms.  Outpatient Prescriptions Prior to Visit  Medication Sig Dispense Refill  . albuterol (PROVENTIL HFA;VENTOLIN HFA) 108 (90 BASE) MCG/ACT inhaler Inhale 2 puffs into the lungs every 6 (six) hours as needed for wheezing or shortness of breath.    [name] amLODipine (NORVASC) 10 MG tablet TAKE ONE TABLET BY MOUTH ONCE DAILY 90 tablet 0  . aspirin 81 MG tablet Take 81 mg by mouth daily.      [name] atorvastatin (LIPITOR) 20 MG tablet TAKE ONE TABLET BY MOUTH ONCE DAILY 90 tablet 3  . bimatoprost (LUMIGAN) 0.01 % SOLN Place 1 drop into both eyes at bedtime.    . budesonide (PULMICORT) 180 MCG/ACT inhaler Inhale 2 puffs into the lungs 2 (two) times daily. 3 each 3  . celecoxib (CELEBREX) 200 MG capsule TAKE ONE CAPSULE BY MOUTH ONCE DAILY 90 capsule 0  . CHOLECALCIFEROL PO Take 1,000 mg by mouth daily. Vitamin D 3    . clopidogrel (PLAVIX) 75 MG tablet TAKE ONE TABLET BY MOUTH ONCE DAILY 90 tablet 2  . dorzolamide-timolol (COSOPT) 22.3-6.8 MG/ML ophthalmic solution Place 1 drop into both eyes 2 (two) times daily.     . finasteride (PROSCAR) 5 MG tablet Take 5 mg by mouth daily.     [name] gabapentin (NEURONTIN) 800 MG tablet TAKE ONE TABLET BY MOUTH 4 TIMES DAILY 360 tablet 2  . Multiple Vitamin (MULTIVITAMIN) tablet Take 1 tablet by mouth daily.      . pantoprazole (PROTONIX) 40 MG tablet TAKE ONE TABLET BY MOUTH ONCE DAILY 90 tablet 3  . tadalafil (CIALIS) 5 MG tablet Take 5 mg by mouth daily.    [name] triamterene-hydrochlorothiazide (MAXZIDE-25) 37.5-25 MG tablet TAKE ONE TABLET BY MOUTH ONCE DAILY 90 tablet 0  . fluticasone  (FLONASE) 50 MCG/ACT nasal spray USE TWO SPRAY(S) IN EACH NOSTRIL ONCE DAILY 48 g 3  . gabapentin (NEURONTIN) 800 MG tablet TAKE ONE TABLET BY MOUTH FOUR TIMES DAILY 360 tablet 2   No facility-administered medications prior to visit.    ROS Review of Systems  Constitutional: Negative.   HENT: Positive for congestion, ear pain, postnasal drip, rhinorrhea and sneezing. Negative for ear discharge, facial swelling, hearing loss, sinus pressure, sore throat, tinnitus, trouble swallowing and voice change.   Eyes: Negative.  Negative for visual disturbance.  Respiratory: Negative.  Negative for cough, choking, chest tightness, shortness of breath and stridor.   Cardiovascular: Negative.  Negative for chest pain, palpitations and leg swelling.  Gastrointestinal: Negative.  Negative for nausea, vomiting, abdominal pain, diarrhea and constipation.  Endocrine: Negative.   Genitourinary: Negative.   Musculoskeletal: Negative.  Negative for myalgias, back pain, joint swelling, arthralgias and neck pain.  Skin: Negative.  Negative for rash.  Allergic/Immunologic: Negative.   Neurological: Negative.  Negative for dizziness, tremors, seizures, syncope, facial asymmetry, speech difficulty, weakness, light-headedness, numbness and headaches.  Hematological: Negative.  Negative for adenopathy. Does not bruise/bleed easily.  Psychiatric/Behavioral: Negative.     Objective:  BP 120/70 mmHg  Pulse 47  Temp(Src) 98.4 F (36.9 C) (Oral)  Resp 16  Ht '5\' 10"'  (1.778 m)  Wt 200 lb (90.719 kg)  BMI 28.70 kg/m2  SpO2 97%  BP Readings from Last 3 Encounters:  01/13/16 120/70  12/02/15 117/48  10/17/15 120/60    Wt Readings from Last 3 Encounters:  01/13/16 200 lb (90.719 kg)  12/02/15 198 lb 14.4 oz (90.22 kg)  10/17/15 200 lb 3.2 oz (90.81 kg)    Physical Exam  Constitutional: No distress.  HENT:  Head: Normocephalic and atraumatic.  Right Ear: Hearing, tympanic membrane, external ear and ear  canal normal.  Left Ear: Hearing, tympanic membrane, external ear and ear canal normal.  Nose: Mucosal edema and rhinorrhea (clear phlegm) present. No nasal deformity. Right sinus exhibits no maxillary sinus tenderness and no frontal sinus tenderness. Left sinus exhibits no maxillary sinus tenderness and no frontal sinus tenderness.  Mouth/Throat: Oropharynx is clear and moist and mucous membranes are normal. Mucous membranes are not pale, not dry and not cyanotic. No oral lesions. No trismus in the jaw. No uvula swelling. No oropharyngeal exudate, posterior oropharyngeal edema, posterior oropharyngeal erythema or tonsillar abscesses.  Eyes: Conjunctivae are normal. Right eye exhibits no discharge. Left eye exhibits no discharge. No scleral icterus.  Neck: Normal range of motion. Neck supple. No JVD present. No tracheal deviation present. No thyromegaly present.  Cardiovascular: Normal rate, regular rhythm, normal heart sounds and intact distal pulses.  Exam reveals no gallop and no friction rub.   No murmur heard. Pulmonary/Chest: Effort normal and breath sounds normal. No stridor. No respiratory distress. He has no wheezes. He has no rales. He exhibits no tenderness.  Abdominal: Soft. Bowel sounds are normal. He exhibits no distension and no mass. There is no tenderness. There is no rebound and no guarding.  Lymphadenopathy:    He has no cervical adenopathy.  Skin: He is not diaphoretic.  Vitals reviewed.   Lab Results  Component Value Date   WBC 3.4* 10/20/2015   HGB 14.2 10/20/2015   HCT 41.5 10/20/2015   PLT 165 10/20/2015   GLUCOSE 131 10/20/2015   CHOL 171 02/06/2015   TRIG 62.0 02/06/2015   HDL 62.80 02/06/2015   LDLCALC 96 02/06/2015   ALT 27 10/20/2015   AST 30 10/20/2015   NA 141 10/20/2015   K 4.0 10/20/2015   CL 103 03/26/2015   CREATININE 1.0 10/20/2015   BUN 15.7 10/20/2015   CO2 29 10/20/2015   TSH 2.46 02/06/2015   PSA 3.42 02/06/2015   INR 1.1* 03/26/2015     Dg Bone Survey Met  10/20/2015  CLINICAL DATA:  Monoclonal gammopathy.  Staging myeloma. EXAM: METASTATIC BONE SURVEY COMPARISON:  12/13/2011. 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 Right carotid artery calcifications. Stable probable bone island in the right iliac bone. Old, healed left tibia fracture. Possible old, healed proximal left tibia and distal right fibular fractures and old, healed distal left femur fracture. Mild right knee degenerative changes. No lucent bone lesions are seen. IMPRESSION: 1. No lucent bone lesions. 2. Cervical, thoracic and lumbar spine degenerative changes. 3. Mild right knee degenerative changes 4. Right carotid artery atheromatous calcifications. Electronically Signed   By: Claudie Revering M.D.   On: 10/20/2015 11:06    Assessment & Plan:   Jionni was seen today for allergic rhinitis .  Diagnoses and all orders for this visit:  Need for Tdap vaccination -     Tdap vaccine greater than or equal to 7yo IM  Allergic rhinitis  due to pollen- he is having a flareup of this so I start Zyrtec and today I gave him an injection of Depo-Medrol to reduce his symptoms. -     Discontinue: fluticasone (FLONASE) 50 MCG/ACT nasal spray; USE TWO SPRAY(S) IN EACH NOSTRIL ONCE DAILY -     cetirizine (ZYRTEC) 10 MG tablet; Take 1 tablet (10 mg total) by mouth daily. -     methylPREDNISolone acetate (DEPO-MEDROL) injection 120 mg; Inject 1.5 mLs (120 mg total) into the muscle once.  Eustachian tube dysfunction, left- he is not willing to take a decongestant so will start Zyrtec, I also gave him an injection of Depo-Medrol to help reduce the inflammation in his eustachian tube in to reduce his symptoms. -     Discontinue: fluticasone (FLONASE) 50 MCG/ACT nasal spray; USE TWO SPRAY(S) IN EACH NOSTRIL ONCE DAILY -     cetirizine (ZYRTEC) 10 MG tablet; Take 1 tablet (10 mg  total) by mouth daily. -     methylPREDNISolone acetate (DEPO-MEDROL) injection 120 mg; Inject 1.5 mLs (120 mg total) into the muscle once.   I have discontinued Mr. Reigle fluticasone. I am also having him start on cetirizine. Additionally, I am having him maintain his aspirin, multivitamin, tadalafil, finasteride, CHOLECALCIFEROL PO, budesonide, bimatoprost, atorvastatin, pantoprazole, dorzolamide-timolol, albuterol, triamterene-hydrochlorothiazide, amLODipine, celecoxib, gabapentin, and clopidogrel. We administered methylPREDNISolone acetate.  Meds ordered this encounter  Medications  . DISCONTD: fluticasone (FLONASE) 50 MCG/ACT nasal spray    Sig: USE TWO SPRAY(S) IN EACH NOSTRIL ONCE DAILY    Dispense:  48 g    Refill:  3  . cetirizine (ZYRTEC) 10 MG tablet    Sig: Take 1 tablet (10 mg total) by mouth daily.    Dispense:  30 tablet    Refill:  11  . methylPREDNISolone acetate (DEPO-MEDROL) injection 120 mg    Sig:      Follow-up: Return in about 6 weeks (around 02/24/2016).  [name], MD

## 2016-01-21 DIAGNOSIS — M1812 Unilateral primary osteoarthritis of first carpometacarpal joint, left hand: Secondary | ICD-10-CM | POA: Diagnosis not present

## 2016-01-21 DIAGNOSIS — G5601 Carpal tunnel syndrome, right upper limb: Secondary | ICD-10-CM | POA: Diagnosis not present

## 2016-02-09 ENCOUNTER — Other Ambulatory Visit (INDEPENDENT_AMBULATORY_CARE_PROVIDER_SITE_OTHER): Payer: Medicare Other

## 2016-02-09 ENCOUNTER — Encounter: Payer: Self-pay | Admitting: Internal Medicine

## 2016-02-09 ENCOUNTER — Ambulatory Visit (INDEPENDENT_AMBULATORY_CARE_PROVIDER_SITE_OTHER): Payer: Medicare Other | Admitting: Internal Medicine

## 2016-02-09 VITALS — BP 148/80 | HR 50 | Temp 98.0°F | Resp 16 | Ht 70.0 in | Wt 192.0 lb

## 2016-02-09 DIAGNOSIS — E785 Hyperlipidemia, unspecified: Secondary | ICD-10-CM

## 2016-02-09 DIAGNOSIS — I10 Essential (primary) hypertension: Secondary | ICD-10-CM

## 2016-02-09 DIAGNOSIS — R351 Nocturia: Secondary | ICD-10-CM

## 2016-02-09 DIAGNOSIS — N401 Enlarged prostate with lower urinary tract symptoms: Secondary | ICD-10-CM

## 2016-02-09 DIAGNOSIS — H31012 Macula scars of posterior pole (postinflammatory) (post-traumatic), left eye: Secondary | ICD-10-CM | POA: Diagnosis not present

## 2016-02-09 DIAGNOSIS — Z23 Encounter for immunization: Secondary | ICD-10-CM | POA: Diagnosis not present

## 2016-02-09 DIAGNOSIS — I25810 Atherosclerosis of coronary artery bypass graft(s) without angina pectoris: Secondary | ICD-10-CM | POA: Diagnosis not present

## 2016-02-09 DIAGNOSIS — Z0000 Encounter for general adult medical examination without abnormal findings: Secondary | ICD-10-CM | POA: Diagnosis not present

## 2016-02-09 DIAGNOSIS — H2511 Age-related nuclear cataract, right eye: Secondary | ICD-10-CM | POA: Diagnosis not present

## 2016-02-09 DIAGNOSIS — H401112 Primary open-angle glaucoma, right eye, moderate stage: Secondary | ICD-10-CM | POA: Diagnosis not present

## 2016-02-09 DIAGNOSIS — H401122 Primary open-angle glaucoma, left eye, moderate stage: Secondary | ICD-10-CM | POA: Diagnosis not present

## 2016-02-09 DIAGNOSIS — R001 Bradycardia, unspecified: Secondary | ICD-10-CM

## 2016-02-09 LAB — LIPID PANEL
Cholesterol: 160 mg/dL (ref 0–200)
HDL: 54 mg/dL (ref >39.00)
LDL Cholesterol: 91 mg/dL (ref 0–99)
NonHDL: 106.12
Total CHOL/HDL Ratio: 3
Triglycerides: 76 mg/dL (ref 0.0–149.0)
VLDL: 15.2 mg/dL (ref 0.0–40.0)

## 2016-02-09 LAB — COMPREHENSIVE METABOLIC PANEL
ALT: 18 U/L (ref 0–53)
AST: 22 U/L (ref 0–37)
Albumin: 4.9 g/dL (ref 3.5–5.2)
Alkaline Phosphatase: 52 U/L (ref 39–117)
BUN: 20 mg/dL (ref 6–23)
CO2: 32 mEq/L (ref 19–32)
Calcium: 10.5 mg/dL (ref 8.4–10.5)
Chloride: 100 mEq/L (ref 96–112)
Creatinine, Ser: 0.97 mg/dL (ref 0.40–1.50)
GFR: 80.67 mL/min (ref >60.00)
Glucose, Bld: 93 mg/dL (ref 70–99)
Potassium: 3.9 mEq/L (ref 3.5–5.1)
Sodium: 140 mEq/L (ref 135–145)
Total Bilirubin: 1 mg/dL (ref 0.2–1.2)
Total Protein: 7.6 g/dL (ref 6.0–8.3)

## 2016-02-09 LAB — CBC WITH DIFFERENTIAL/PLATELET
Basophils Absolute: 0 10*3/uL (ref 0.0–0.1)
Basophils Relative: 0.4 % (ref 0.0–3.0)
Eosinophils Absolute: 0.5 10*3/uL (ref 0.0–0.7)
Eosinophils Relative: 6.8 % — ABNORMAL HIGH (ref 0.0–5.0)
HCT: 43.2 % (ref 39.0–52.0)
Hemoglobin: 14.8 g/dL (ref 13.0–17.0)
Lymphocytes Relative: 15.6 % (ref 12.0–46.0)
Lymphs Abs: 1.1 10*3/uL (ref 0.7–4.0)
MCHC: 34.2 g/dL (ref 30.0–36.0)
MCV: 91.2 fl (ref 78.0–100.0)
Monocytes Absolute: 0.6 10*3/uL (ref 0.1–1.0)
Monocytes Relative: 8.4 % (ref 3.0–12.0)
Neutro Abs: 4.7 10*3/uL (ref 1.4–7.7)
Neutrophils Relative %: 68.8 % (ref 43.0–77.0)
Platelets: 195 10*3/uL (ref 150.0–400.0)
RBC: 4.73 Mil/uL (ref 4.22–5.81)
RDW: 12.8 % (ref 11.5–15.5)
WBC: 6.9 10*3/uL (ref 4.0–10.5)

## 2016-02-09 LAB — TSH: TSH: 2.47 u[IU]/mL (ref 0.35–4.50)

## 2016-02-09 LAB — PSA: PSA: 2.76 ng/mL (ref 0.10–4.00)

## 2016-02-09 NOTE — Patient Instructions (Signed)

## 2016-02-09 NOTE — Progress Notes (Signed)
Subjective:  Patient ID: Derrick [name], male    DOB: [birth year]  Age: 75 y.o. MRN: [id number]  CC: Annual Exam; Coronary Artery Disease; Hypertension; and Hyperlipidemia   HPI Derrick [name] presents for a complete physical as well as follow-up on CAD, hypertension and hyperlipidemia. He has had no recent episodes of chest pain, shortness of breath, dizziness, lightheadedness or syncope. He does complain of chronic unchanged fatigue.  Outpatient Prescriptions Prior to Visit  Medication Sig Dispense Refill  . albuterol (PROVENTIL HFA;VENTOLIN HFA) 108 (90 BASE) MCG/ACT inhaler Inhale 2 puffs into the lungs every 6 (six) hours as needed for wheezing or shortness of breath.    Derrick [name] amLODipine (NORVASC) 10 MG tablet TAKE ONE TABLET BY MOUTH ONCE DAILY 90 tablet 0  . aspirin 81 MG tablet Take 81 mg by mouth daily.      Derrick [name] atorvastatin (LIPITOR) 20 MG tablet TAKE ONE TABLET BY MOUTH ONCE DAILY 90 tablet 3  . bimatoprost (LUMIGAN) 0.01 % SOLN Place 1 drop into both eyes at bedtime.    . budesonide (PULMICORT) 180 MCG/ACT inhaler Inhale 2 puffs into the lungs 2 (two) times daily. 3 each 3  . celecoxib (CELEBREX) 200 MG capsule TAKE ONE CAPSULE BY MOUTH ONCE DAILY 90 capsule 0  . cetirizine (ZYRTEC) 10 MG tablet Take 1 tablet (10 mg total) by mouth daily. 30 tablet 11  . CHOLECALCIFEROL PO Take 1,000 mg by mouth daily. Vitamin D 3    . clopidogrel (PLAVIX) 75 MG tablet TAKE ONE TABLET BY MOUTH ONCE DAILY 90 tablet 2  . dorzolamide-timolol (COSOPT) 22.3-6.8 MG/ML ophthalmic solution Place 1 drop into both eyes 2 (two) times daily.     . finasteride (PROSCAR) 5 MG tablet Take 5 mg by mouth daily.     . fluticasone (FLONASE) 50 MCG/ACT nasal spray USE TWO SPRAY(S) IN EACH NOSTRIL ONCE DAILY (Patient taking differently: USE TWO SPRAY(S) IN EACH NOSTRIL TWICE DAILY) 48 g 3  . gabapentin (NEURONTIN) 800 MG tablet TAKE ONE TABLET BY MOUTH 4 TIMES DAILY 360 tablet 2  . Multiple Vitamin (MULTIVITAMIN) tablet Take 1  tablet by mouth daily.      . pantoprazole (PROTONIX) 40 MG tablet TAKE ONE TABLET BY MOUTH ONCE DAILY 90 tablet 3  . tadalafil (CIALIS) 5 MG tablet Take 5 mg by mouth daily.    Derrick [name] triamterene-hydrochlorothiazide (MAXZIDE-25) 37.5-25 MG tablet TAKE ONE TABLET BY MOUTH ONCE DAILY 90 tablet 0   No facility-administered medications prior to visit.    ROS Review of Systems  Constitutional: Positive for fatigue. Negative for fever, chills, diaphoresis, appetite change and unexpected weight change.  HENT: Negative.  Negative for congestion, sinus pressure, sore throat and trouble swallowing.   Eyes: Negative.  Negative for visual disturbance.  Respiratory: Negative.  Negative for cough, choking, chest tightness, shortness of breath and stridor.   Cardiovascular: Negative.  Negative for chest pain, palpitations and leg swelling.  Gastrointestinal: Negative.  Negative for nausea, vomiting, abdominal pain, diarrhea, constipation and blood in stool.  Endocrine: Negative.   Genitourinary: Negative.  Negative for difficulty urinating.  Musculoskeletal: Positive for arthralgias. Negative for myalgias, back pain, joint swelling and neck pain.  Skin: Negative.  Negative for color change.  Allergic/Immunologic: Negative.   Neurological: Negative.  Negative for dizziness, syncope, weakness, numbness and headaches.  Hematological: Negative.  Negative for adenopathy. Does not bruise/bleed easily.  Psychiatric/Behavioral: Negative.     Objective:  BP 148/80 mmHg  Pulse 50  Temp(Src) 98 F (36.7 C) (  Oral)  Resp 16  Ht '5\' 10"'  (1.778 m)  Wt 192 lb (87.091 kg)  BMI 27.55 kg/m2  SpO2 98%  BP Readings from Last 3 Encounters:  02/09/16 148/80  01/13/16 120/70  12/02/15 117/48    Wt Readings from Last 3 Encounters:  02/09/16 192 lb (87.091 kg)  01/13/16 200 lb (90.719 kg)  12/02/15 198 lb 14.4 oz (90.22 kg)    Physical Exam  Constitutional: He is oriented to person, place, and time. No distress.   HENT:  Mouth/Throat: Oropharynx is clear and moist. No oropharyngeal exudate.  Eyes: Conjunctivae are normal. Right eye exhibits no discharge. Left eye exhibits no discharge. No scleral icterus.  Neck: Normal range of motion. Neck supple. No JVD present. No tracheal deviation present. No thyromegaly present.  Cardiovascular: Normal rate, regular rhythm and intact distal pulses.  Exam reveals no gallop and no friction rub.   Murmur heard.  Decrescendo systolic murmur is present with a grade of 1/6   No diastolic murmur is present  Pulses:      Carotid pulses are 1+ on the right side, and 1+ on the left side.      Radial pulses are 1+ on the right side, and 1+ on the left side.       Femoral pulses are 1+ on the right side, and 1+ on the left side.      Popliteal pulses are 1+ on the right side, and 1+ on the left side.       Dorsalis pedis pulses are 1+ on the right side, and 1+ on the left side.       Posterior tibial pulses are 1+ on the right side, and 1+ on the left side.  1/6 SEM over RUSB  Pulmonary/Chest: Effort normal and breath sounds normal. No stridor. No respiratory distress. He has no wheezes. He has no rales. He exhibits no tenderness.  Abdominal: Soft. Bowel sounds are normal. He exhibits no distension and no mass. There is no tenderness. There is no rebound and no guarding.  Musculoskeletal: Normal range of motion. He exhibits no edema or tenderness.  Lymphadenopathy:    He has no cervical adenopathy.  Neurological: He is oriented to person, place, and time.  Skin: Skin is warm and dry. No rash noted. He is not diaphoretic. No erythema. No pallor.  Vitals reviewed.   Lab Results  Component Value Date   WBC 6.9 02/09/2016   HGB 14.8 02/09/2016   HCT 43.2 02/09/2016   PLT 195.0 02/09/2016   GLUCOSE 93 02/09/2016   CHOL 160 02/09/2016   TRIG 76.0 02/09/2016   HDL 54.00 02/09/2016   LDLCALC 91 02/09/2016   ALT 18 02/09/2016   AST 22 02/09/2016   NA 140 02/09/2016     K 3.9 02/09/2016   CL 100 02/09/2016   CREATININE 0.97 02/09/2016   BUN 20 02/09/2016   CO2 32 02/09/2016   TSH 2.47 02/09/2016   PSA 2.76 02/09/2016   INR 1.1* 03/26/2015    Dg Bone Survey Met  10/20/2015  CLINICAL DATA:  Monoclonal gammopathy.  Staging myeloma. EXAM: METASTATIC BONE SURVEY COMPARISON:  12/13/2011. 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 Right carotid artery calcifications. Stable probable bone island in the right iliac bone. Old, healed left tibia fracture. Possible old, healed proximal left tibia and distal right fibular fractures and old, healed distal left femur fracture. Mild right knee degenerative  changes. No lucent bone lesions are seen. IMPRESSION: 1. No lucent bone lesions. 2. Cervical, thoracic and lumbar spine degenerative changes. 3. Mild right knee degenerative changes 4. Right carotid artery atheromatous calcifications. Electronically Signed   By: Claudie Revering M.D.   On: 10/20/2015 11:06    Assessment & Plan:   Bearett was seen today for annual exam, coronary artery disease, hypertension and hyperlipidemia.  Diagnoses and all orders for this visit:  Essential hypertension, benign- his blood pressure is adequately well controlled, electrolytes and renal function are stable. 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 -     Lipid panel; Future  BPH associated with nocturia- his PSA has risen slightly but his overall stable, he does not have any symptoms that need to be treated, I will continue  to monitor his PSA. -     PSA; Future  Hyperlipidemia with target LDL less than 100- he has achieved his LDL goal is doing well on statin therapy. -     Lipid panel; Future -     TSH; Future -     Comprehensive metabolic panel; Future  Need for 23-polyvalent pneumococcal polysaccharide vaccine -     Pneumococcal polysaccharide vaccine 23-valent greater than or equal to 2yo subcutaneous/IM  Bradycardia- this may be related to the calcium channel blocker therapy, he is not on a beta blocker. He is asymptomatic with respect to this, he was not willing to do an EKG today, he will see his cardiologist in a month and will follow-up regarding the low heart rate at that time. In the meantime he will let me know if he develops any symptoms related to this.   I am having Mr. Foell maintain his aspirin, multivitamin, tadalafil, finasteride, CHOLECALCIFEROL PO, budesonide, bimatoprost, atorvastatin, pantoprazole, dorzolamide-timolol, albuterol, triamterene-hydrochlorothiazide, amLODipine, celecoxib, gabapentin, clopidogrel, cetirizine, and fluticasone.  No orders of the defined types were placed in this encounter.   See AVS for instructions about healthy living and anticipatory guidance.  Follow-up: Return in about 6 months (around 08/08/2016).  [name], MD

## 2016-02-09 NOTE — Progress Notes (Signed)
Pre visit review using our clinic review tool, if applicable. No additional management support is needed unless otherwise documented below in the visit note. 

## 2016-02-10 ENCOUNTER — Encounter: Payer: Self-pay | Admitting: Internal Medicine

## 2016-02-10 NOTE — Assessment & Plan Note (Signed)

## 2016-02-16 ENCOUNTER — Other Ambulatory Visit (INDEPENDENT_AMBULATORY_CARE_PROVIDER_SITE_OTHER): Payer: Medicare Other

## 2016-02-16 ENCOUNTER — Ambulatory Visit (INDEPENDENT_AMBULATORY_CARE_PROVIDER_SITE_OTHER): Payer: Medicare Other | Admitting: Internal Medicine

## 2016-02-16 ENCOUNTER — Encounter: Payer: Self-pay | Admitting: Internal Medicine

## 2016-02-16 VITALS — BP 136/68 | HR 56 | Temp 98.8°F | Wt 192.0 lb

## 2016-02-16 DIAGNOSIS — R270 Ataxia, unspecified: Secondary | ICD-10-CM

## 2016-02-16 DIAGNOSIS — H9312 Tinnitus, left ear: Secondary | ICD-10-CM

## 2016-02-16 DIAGNOSIS — E559 Vitamin D deficiency, unspecified: Secondary | ICD-10-CM

## 2016-02-16 DIAGNOSIS — R202 Paresthesia of skin: Secondary | ICD-10-CM

## 2016-02-16 DIAGNOSIS — R42 Dizziness and giddiness: Secondary | ICD-10-CM

## 2016-02-16 DIAGNOSIS — I25810 Atherosclerosis of coronary artery bypass graft(s) without angina pectoris: Secondary | ICD-10-CM

## 2016-02-16 LAB — BASIC METABOLIC PANEL
BUN: 18 mg/dL (ref 6–23)
CO2: 31 mEq/L (ref 19–32)
Calcium: 10.5 mg/dL (ref 8.4–10.5)
Chloride: 103 mEq/L (ref 96–112)
Creatinine, Ser: 1.05 mg/dL (ref 0.40–1.50)
GFR: 73.61 mL/min (ref >60.00)
Glucose, Bld: 92 mg/dL (ref 70–99)
Potassium: 4 mEq/L (ref 3.5–5.1)
Sodium: 143 mEq/L (ref 135–145)

## 2016-02-16 LAB — VITAMIN D 25 HYDROXY (VIT D DEFICIENCY, FRACTURES): VITD: 42.93 ng/mL (ref 30.00–100.00)

## 2016-02-16 LAB — VITAMIN B12: Vitamin B-12: 638 pg/mL (ref 211–911)

## 2016-02-16 MED ORDER — PREDNISONE 10 MG PO TABS: ORAL_TABLET | ORAL | Status: DC

## 2016-02-16 NOTE — Assessment & Plan Note (Signed)
2/17 worse Labs Prednisone 10 mg: take 4 tabs a day x 3 days; then 3 tabs a day x 4 days; then 2 tabs a day x 4 days, then 1 tab a day x 6 days, then stop. Take pc.  Potential benefits of steroid  use as well as potential risks  and complications were explained to the patient and were aknowledged. Predn helped before ENT ref if needed

## 2016-02-16 NOTE — Assessment & Plan Note (Signed)
Labs

## 2016-02-16 NOTE — Progress Notes (Signed)
Pre visit review using our clinic review tool, if applicable. No additional management support is needed unless otherwise documented below in the visit note. 

## 2016-02-16 NOTE — Progress Notes (Signed)
Subjective:  Patient ID: Derrick [name], male    DOB: 11/15/1941  Age: 75 y.o. MRN: [id number]  CC: No chief complaint on file.   HPI Byran Bilotti presents for L ear swishing sound, ringing the ear, vertigo, decreased hearing since Jan 2017  Outpatient Prescriptions Prior to Visit  Medication Sig Dispense Refill  . albuterol (PROVENTIL HFA;VENTOLIN HFA) 108 (90 BASE) MCG/ACT inhaler Inhale 2 puffs into the lungs every 6 (six) hours as needed for wheezing or shortness of breath.    [name] amLODipine (NORVASC) 10 MG tablet TAKE ONE TABLET BY MOUTH ONCE DAILY 90 tablet 0  . aspirin 81 MG tablet Take 81 mg by mouth daily.      [name] atorvastatin (LIPITOR) 20 MG tablet TAKE ONE TABLET BY MOUTH ONCE DAILY 90 tablet 3  . bimatoprost (LUMIGAN) 0.01 % SOLN Place 1 drop into both eyes at bedtime.    . budesonide (PULMICORT) 180 MCG/ACT inhaler Inhale 2 puffs into the lungs 2 (two) times daily. 3 each 3  . celecoxib (CELEBREX) 200 MG capsule TAKE ONE CAPSULE BY MOUTH ONCE DAILY 90 capsule 0  . cetirizine (ZYRTEC) 10 MG tablet Take 1 tablet (10 mg total) by mouth daily. 30 tablet 11  . CHOLECALCIFEROL PO Take 1,000 mg by mouth daily. Vitamin D 3    . clopidogrel (PLAVIX) 75 MG tablet TAKE ONE TABLET BY MOUTH ONCE DAILY 90 tablet 2  . dorzolamide-timolol (COSOPT) 22.3-6.8 MG/ML ophthalmic solution Place 1 drop into both eyes 2 (two) times daily.     . finasteride (PROSCAR) 5 MG tablet Take 5 mg by mouth daily.     . fluticasone (FLONASE) 50 MCG/ACT nasal spray USE TWO SPRAY(S) IN EACH NOSTRIL ONCE DAILY (Patient taking differently: USE TWO SPRAY(S) IN EACH NOSTRIL TWICE DAILY) 48 g 3  . gabapentin (NEURONTIN) 800 MG tablet TAKE ONE TABLET BY MOUTH 4 TIMES DAILY 360 tablet 2  . Multiple Vitamin (MULTIVITAMIN) tablet Take 1 tablet by mouth daily.      . pantoprazole (PROTONIX) 40 MG tablet TAKE ONE TABLET BY MOUTH ONCE DAILY 90 tablet 3  . tadalafil (CIALIS) 5 MG tablet Take 5 mg by mouth daily.    [name]  triamterene-hydrochlorothiazide (MAXZIDE-25) 37.5-25 MG tablet TAKE ONE TABLET BY MOUTH ONCE DAILY 90 tablet 0   No facility-administered medications prior to visit.    ROS Review of Systems  Constitutional: Negative for appetite change, fatigue and unexpected weight change.  HENT: Positive for congestion and tinnitus. Negative for ear pain, nosebleeds, sneezing, sore throat and trouble swallowing.   Eyes: Negative for itching and visual disturbance.  Respiratory: Negative for cough.   Cardiovascular: Negative for chest pain, palpitations and leg swelling.  Gastrointestinal: Negative for nausea, diarrhea, blood in stool and abdominal distention.  Genitourinary: Negative for frequency and hematuria.  Musculoskeletal: Negative for back pain, joint swelling, gait problem and neck pain.  Skin: Negative for rash.  Neurological: Positive for dizziness. Negative for tremors, speech difficulty and weakness.  Psychiatric/Behavioral: Negative for sleep disturbance, dysphoric mood and agitation. The patient is not nervous/anxious.     Objective:  BP 136/68 mmHg  Pulse 56  Temp(Src) 98.8 F (37.1 C) (Oral)  Wt 192 lb (87.091 kg)  SpO2 96%  BP Readings from Last 3 Encounters:  02/16/16 136/68  02/09/16 148/80  01/13/16 120/70    Wt Readings from Last 3 Encounters:  02/16/16 192 lb (87.091 kg)  02/09/16 192 lb (87.091 kg)  01/13/16 200 lb (90.719 kg)  Physical Exam  Constitutional: He is oriented to person, place, and time. He appears well-developed. No distress.  NAD  HENT:  Mouth/Throat: Oropharynx is clear and moist.  Eyes: Conjunctivae are normal. Pupils are equal, round, and reactive to light.  Neck: Normal range of motion. No JVD present. No thyromegaly present.  Cardiovascular: Normal rate, regular rhythm, normal heart sounds and intact distal pulses.  Exam reveals no gallop and no friction rub.   No murmur heard. Pulmonary/Chest: Effort normal and breath sounds normal. No  respiratory distress. He has no wheezes. He has no rales. He exhibits no tenderness.  Abdominal: Soft. Bowel sounds are normal. He exhibits no distension and no mass. There is no tenderness. There is no rebound and no guarding.  Musculoskeletal: Normal range of motion. He exhibits no edema or tenderness.  Lymphadenopathy:    He has no cervical adenopathy.  Neurological: He is alert and oriented to person, place, and time. He has normal reflexes. No cranial nerve deficit. He exhibits normal muscle tone. He displays a negative Romberg sign. Coordination abnormal. Gait normal.  Skin: Skin is warm and dry. No rash noted.  Psychiatric: He has a normal mood and affect. His behavior is normal. Judgment and thought content normal.  H-P (-) B Ataxic  Lab Results  Component Value Date   WBC 6.9 02/09/2016   HGB 14.8 02/09/2016   HCT 43.2 02/09/2016   PLT 195.0 02/09/2016   GLUCOSE 93 02/09/2016   CHOL 160 02/09/2016   TRIG 76.0 02/09/2016   HDL 54.00 02/09/2016   LDLCALC 91 02/09/2016   ALT 18 02/09/2016   AST 22 02/09/2016   NA 140 02/09/2016   K 3.9 02/09/2016   CL 100 02/09/2016   CREATININE 0.97 02/09/2016   BUN 20 02/09/2016   CO2 32 02/09/2016   TSH 2.47 02/09/2016   PSA 2.76 02/09/2016   INR 1.1* 03/26/2015    Dg Bone Survey Met  10/20/2015  CLINICAL DATA:  Monoclonal gammopathy.  Staging myeloma. EXAM: METASTATIC BONE SURVEY COMPARISON:  12/13/2011. 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 Right carotid artery calcifications. Stable probable bone island in the right iliac bone. Old, healed left tibia fracture. Possible old, healed proximal left tibia and distal right fibular fractures and old, healed distal left femur fracture. Mild right knee degenerative changes. No lucent bone lesions are seen. IMPRESSION: 1. No lucent bone lesions. 2. Cervical, thoracic and lumbar spine  degenerative changes. 3. Mild right knee degenerative changes 4. Right carotid artery atheromatous calcifications. Electronically Signed   By: Claudie Revering M.D.   On: 10/20/2015 11:06    Assessment & Plan:   There are no diagnoses linked to this encounter. I am having Mr. Dupree maintain his aspirin, multivitamin, tadalafil, finasteride, CHOLECALCIFEROL PO, budesonide, bimatoprost, atorvastatin, pantoprazole, dorzolamide-timolol, albuterol, triamterene-hydrochlorothiazide, amLODipine, celecoxib, gabapentin, clopidogrel, cetirizine, and fluticasone.  No orders of the defined types were placed in this encounter.     Follow-up: No Follow-up on file.  Walker Kehr, MD

## 2016-02-16 NOTE — Assessment & Plan Note (Addendum)
ENT ref offered - ??Menier's vs other Prednisone 10 mg: take 4 tabs a day x 3 days; then 3 tabs a day x 4 days; then 2 tabs a day x 4 days, then 1 tab a day x 6 days, then stop. Take pc.  Potential benefits of steroid  use as well as potential risks  and complications were explained to the patient and were aknowledged. F/u w/Dr Ronnald Ramp

## 2016-02-16 NOTE — Assessment & Plan Note (Signed)
2/17 ?chronic Labs

## 2016-03-01 ENCOUNTER — Other Ambulatory Visit: Payer: Self-pay | Admitting: Internal Medicine

## 2016-03-01 ENCOUNTER — Encounter: Payer: Self-pay | Admitting: Internal Medicine

## 2016-03-01 ENCOUNTER — Ambulatory Visit (INDEPENDENT_AMBULATORY_CARE_PROVIDER_SITE_OTHER): Payer: Medicare Other | Admitting: Internal Medicine

## 2016-03-01 VITALS — BP 130/70 | HR 63 | Temp 98.8°F | Resp 16 | Ht 70.0 in | Wt 194.0 lb

## 2016-03-01 DIAGNOSIS — M1812 Unilateral primary osteoarthritis of first carpometacarpal joint, left hand: Secondary | ICD-10-CM | POA: Diagnosis not present

## 2016-03-01 DIAGNOSIS — R42 Dizziness and giddiness: Secondary | ICD-10-CM | POA: Diagnosis not present

## 2016-03-01 DIAGNOSIS — I10 Essential (primary) hypertension: Secondary | ICD-10-CM | POA: Diagnosis not present

## 2016-03-01 DIAGNOSIS — I25810 Atherosclerosis of coronary artery bypass graft(s) without angina pectoris: Secondary | ICD-10-CM | POA: Diagnosis not present

## 2016-03-01 NOTE — Progress Notes (Signed)
Pre visit review using our clinic review tool, if applicable. No additional management support is needed unless otherwise documented below in the visit note. 

## 2016-03-01 NOTE — Progress Notes (Signed)
Subjective:  Patient ID: Derrick [name], male    DOB: [birth year]  Age: 75 y.o. MRN: [id number]  CC: Dizziness   HPI Derrick [name] presents for f/up after a recent episode of dizziness/vertigo - he was treated with steroids and tells me that he feels much better. He has a very complicated hx of inflammatory conditions (sarcoid, MGUS, etc.). He has had recurrent episodes of vertigo over the last 6 years. This episode was similar to prior episodes.  Outpatient Prescriptions Prior to Visit  Medication Sig Dispense Refill  . albuterol (PROVENTIL HFA;VENTOLIN HFA) 108 (90 BASE) MCG/ACT inhaler Inhale 2 puffs into the lungs every 6 (six) hours as needed for wheezing or shortness of breath.    [name] amLODipine (NORVASC) 10 MG tablet TAKE ONE TABLET BY MOUTH ONCE DAILY 90 tablet 0  . aspirin 81 MG tablet Take 81 mg by mouth daily.      [name] atorvastatin (LIPITOR) 20 MG tablet TAKE ONE TABLET BY MOUTH ONCE DAILY 90 tablet 3  . bimatoprost (LUMIGAN) 0.01 % SOLN Place 1 drop into both eyes at bedtime.    . budesonide (PULMICORT) 180 MCG/ACT inhaler Inhale 2 puffs into the lungs 2 (two) times daily. 3 each 3  . celecoxib (CELEBREX) 200 MG capsule TAKE ONE CAPSULE BY MOUTH ONCE DAILY 90 capsule 0  . cetirizine (ZYRTEC) 10 MG tablet Take 1 tablet (10 mg total) by mouth daily. 30 tablet 11  . CHOLECALCIFEROL PO Take 1,000 mg by mouth daily. Vitamin D 3    . clopidogrel (PLAVIX) 75 MG tablet TAKE ONE TABLET BY MOUTH ONCE DAILY 90 tablet 2  . dorzolamide-timolol (COSOPT) 22.3-6.8 MG/ML ophthalmic solution Place 1 drop into both eyes 2 (two) times daily.     . finasteride (PROSCAR) 5 MG tablet Take 5 mg by mouth daily.     . fluticasone (FLONASE) 50 MCG/ACT nasal spray USE TWO SPRAY(S) IN EACH NOSTRIL ONCE DAILY (Patient taking differently: USE TWO SPRAY(S) IN EACH NOSTRIL TWICE DAILY) 48 g 3  . gabapentin (NEURONTIN) 800 MG tablet TAKE ONE TABLET BY MOUTH 4 TIMES DAILY 360 tablet 2  . Multiple Vitamin (MULTIVITAMIN)  tablet Take 1 tablet by mouth daily.      . pantoprazole (PROTONIX) 40 MG tablet TAKE ONE TABLET BY MOUTH ONCE DAILY 90 tablet 3  . predniSONE (DELTASONE) 10 MG tablet Prednisone 10 mg: take 4 tabs a day x 3 days; then 3 tabs a day x 4 days; then 2 tabs a day x 4 days, then 1 tab a day x 6 days, then stop. Take pc. 38 tablet 1  . tadalafil (CIALIS) 5 MG tablet Take 5 mg by mouth daily.    [name] triamterene-hydrochlorothiazide (MAXZIDE-25) 37.5-25 MG tablet TAKE ONE TABLET BY MOUTH ONCE DAILY 90 tablet 3   No facility-administered medications prior to visit.    ROS Review of Systems  Constitutional: Negative.  Negative for fever, chills, diaphoresis, appetite change and fatigue.  HENT: Negative.   Eyes: Negative.   Respiratory: Negative.  Negative for cough, choking, chest tightness, shortness of breath and stridor.   Cardiovascular: Negative.  Negative for chest pain, palpitations and leg swelling.  Gastrointestinal: Negative.  Negative for abdominal pain and diarrhea.  Endocrine: Negative.   Musculoskeletal: Negative.  Negative for myalgias, back pain, joint swelling, arthralgias and neck stiffness.  Skin: Negative.   Allergic/Immunologic: Negative.   Neurological: Negative.  Negative for dizziness, tremors, facial asymmetry, speech difficulty, weakness, light-headedness, numbness and headaches.  Hematological: Negative.  Negative for adenopathy. Does not bruise/bleed easily.  Psychiatric/Behavioral: Negative.     Objective:  BP 130/70 mmHg  Pulse 63  Temp(Src) 98.8 F (37.1 C) (Oral)  Resp 16  Ht '5\' 10"'  (1.778 m)  Wt 194 lb (87.998 kg)  BMI 27.84 kg/m2  SpO2 97%  BP Readings from Last 3 Encounters:  03/01/16 130/70  02/16/16 136/68  02/09/16 148/80    Wt Readings from Last 3 Encounters:  03/01/16 194 lb (87.998 kg)  02/16/16 192 lb (87.091 kg)  02/09/16 192 lb (87.091 kg)    Physical Exam  Constitutional: He is oriented to person, place, and time. No distress.  HENT:    Right Ear: Hearing, tympanic membrane, external ear and ear canal normal.  Left Ear: Hearing, tympanic membrane, external ear and ear canal normal.  Mouth/Throat: Oropharynx is clear and moist. No oropharyngeal exudate.  Eyes: Conjunctivae and EOM are normal. Right eye exhibits no discharge. Left eye exhibits no discharge. No scleral icterus.  Neck: Normal range of motion. Neck supple. No JVD present. No tracheal deviation present. No thyromegaly present.  Cardiovascular: Normal rate, regular rhythm and normal heart sounds.  Exam reveals no gallop and no friction rub.   No murmur heard. Pulmonary/Chest: Effort normal and breath sounds normal. No stridor. No respiratory distress. He has no wheezes. He has no rales. He exhibits no tenderness.  Abdominal: Soft. Bowel sounds are normal. He exhibits no distension and no mass. There is no tenderness. There is no rebound and no guarding.  Musculoskeletal: Normal range of motion. He exhibits no edema or tenderness.  Lymphadenopathy:    He has no cervical adenopathy.  Neurological: He is alert and oriented to person, place, and time. He displays normal reflexes. No cranial nerve deficit. He exhibits normal muscle tone. Coordination normal.  Reflex Scores:      Tricep reflexes are 1+ on the right side and 1+ on the left side.      Bicep reflexes are 1+ on the right side and 1+ on the left side.      Brachioradialis reflexes are 1+ on the right side and 1+ on the left side.      Patellar reflexes are 0 on the right side and 0 on the left side.      Achilles reflexes are 0 on the right side and 0 on the left side. Skin: Skin is warm and dry. No rash noted. He is not diaphoretic. No erythema. No pallor.  Psychiatric: He has a normal mood and affect. His behavior is normal. Judgment and thought content normal.  Vitals reviewed.   Lab Results  Component Value Date   WBC 6.9 02/09/2016   HGB 14.8 02/09/2016   HCT 43.2 02/09/2016   PLT 195.0 02/09/2016    GLUCOSE 92 02/16/2016   CHOL 160 02/09/2016   TRIG 76.0 02/09/2016   HDL 54.00 02/09/2016   LDLCALC 91 02/09/2016   ALT 18 02/09/2016   AST 22 02/09/2016   NA 143 02/16/2016   K 4.0 02/16/2016   CL 103 02/16/2016   CREATININE 1.05 02/16/2016   BUN 18 02/16/2016   CO2 31 02/16/2016   TSH 2.47 02/09/2016   PSA 2.76 02/09/2016   INR 1.1* 03/26/2015    Dg Bone Survey Met  10/20/2015  CLINICAL DATA:  Monoclonal gammopathy.  Staging myeloma. EXAM: METASTATIC BONE SURVEY COMPARISON:  12/13/2011. 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  Right carotid artery calcifications. Stable probable bone island in the right iliac bone. Old, healed left tibia fracture. Possible old, healed proximal left tibia and distal right fibular fractures and old, healed distal left femur fracture. Mild right knee degenerative changes. No lucent bone lesions are seen. IMPRESSION: 1. No lucent bone lesions. 2. Cervical, thoracic and lumbar spine degenerative changes. 3. Mild right knee degenerative changes 4. Right carotid artery atheromatous calcifications. Electronically Signed   By: Claudie Revering M.D.   On: 10/20/2015 11:06    Assessment & Plan:   Marino was seen today for dizziness.  Diagnoses and all orders for this visit:  Essential hypertension, benign- his BP is well controlled  Vertigo- this has resolved  I am having Derrick [name] maintain his aspirin, multivitamin, tadalafil, finasteride, CHOLECALCIFEROL PO, budesonide, bimatoprost, atorvastatin, pantoprazole, dorzolamide-timolol, albuterol, amLODipine, celecoxib, gabapentin, clopidogrel, cetirizine, fluticasone, predniSONE, and triamterene-hydrochlorothiazide.  No orders of the defined types were placed in this encounter.     Follow-up: No Follow-up on file.  [name], MD

## 2016-03-02 NOTE — Patient Instructions (Signed)

## 2016-03-12 ENCOUNTER — Ambulatory Visit (INDEPENDENT_AMBULATORY_CARE_PROVIDER_SITE_OTHER): Payer: Medicare Other | Admitting: Nurse Practitioner

## 2016-03-12 ENCOUNTER — Encounter: Payer: Self-pay | Admitting: Nurse Practitioner

## 2016-03-12 VITALS — BP 120/56 | HR 43 | Temp 97.7°F | Ht 70.0 in | Wt 197.2 lb

## 2016-03-12 DIAGNOSIS — H6982 Other specified disorders of Eustachian tube, left ear: Secondary | ICD-10-CM | POA: Diagnosis not present

## 2016-03-12 DIAGNOSIS — I25810 Atherosclerosis of coronary artery bypass graft(s) without angina pectoris: Secondary | ICD-10-CM

## 2016-03-12 MED ORDER — AMOXICILLIN-POT CLAVULANATE 875-125 MG PO TABS: 1.0000 | ORAL_TABLET | Freq: Two times a day (BID) | ORAL | Status: DC

## 2016-03-12 NOTE — Patient Instructions (Signed)

## 2016-03-12 NOTE — Progress Notes (Signed)
Patient ID: Derrick [name], male    DOB: May 27, 1941  Age: 75 y.o. MRN: [id number]  CC: Otalgia   HPI Derrick [name] presents for CC of left otalgia 6-8 weeks.  1) Prednisone and steroid injections  Comes back and he feels muted in left ear  Ringing the morning  Dr. Ronnald Ramp on 03/01/16  Dr. Camila Li on 01/2016 long taper of prednisone some improvement  Dizziness- when turning head  Lightheaded with walking  Fatigued  History of parotid gland and ear infections  Treatment to date-  Flonase- improved Zyrtec- improved  History Derrick [name] has a past medical history of BENIGN PROSTATIC HYPERTROPHY, WITH OBSTRUCTION (05/28/2010); COLONIC POLYPS, ADENOMATOUS, HX OF (05/28/2010); GENERALIZED OSTEOARTHROSIS UNSPECIFIED SITE (05/28/2010); GERD (05/28/2010); HYPERLIPIDEMIA (05/28/2010); Intrinsic asthma, unspecified (05/28/2010); Malignant neoplasm of descending colon (Derrick [name]) (05/28/2010); MITRAL VALVE PROLAPSE (05/28/2010); 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); PALPITATIONS, CHRONIC (05/28/2010); SLEEP APNEA, OBSTRUCTIVE, MODERATE (05/28/2010); UNSPECIFIED INFLAMMATORY AND TOXIC NEUROPATHY (05/28/2010); VITAMIN D DEFICIENCY (05/28/2010); CAD (coronary artery disease); Monoclonal gammopathy of undetermined significance (12/11/2011); Coronary artery disease (12/11/2011); MI (myocardial infarction) ([name]) (12/11/2011); Vision loss; 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   He has past surgical history that includes Tonsillectomy (1950); Colon surgery (2000); lung wedge (2001); Appendectomy (1954); Knee surgery (2003); heart attack (06/03/11); Cardiac catheterization (06/03/11); Cataract extraction; and left heart  catheterization with coronary angiogram (N/A, 03/31/2015).   His family history includes Arthritis in his mother; Cancer in his brother; Colon polyps in his mother; Coronary artery disease in his brother and mother.He reports that he has never smoked. He has never used smokeless tobacco. He reports that he drinks alcohol. He reports that he does not use illicit drugs.  Outpatient Prescriptions Prior to Visit  Medication Sig Dispense Refill  . albuterol (PROVENTIL HFA;VENTOLIN HFA) 108 (90 BASE) MCG/ACT inhaler Inhale 2 puffs into the lungs every 6 (six) hours as needed for wheezing or shortness of breath.    [name] amLODipine (NORVASC) 10 MG tablet TAKE ONE TABLET BY MOUTH ONCE DAILY 90 tablet 0  . aspirin 81 MG tablet Take 81 mg by mouth daily.      [name] atorvastatin (LIPITOR) 20 MG tablet TAKE ONE TABLET BY MOUTH ONCE DAILY 90 tablet 3  . bimatoprost (LUMIGAN) 0.01 % SOLN Place 1 drop into both eyes at bedtime.    . budesonide (PULMICORT) 180 MCG/ACT inhaler Inhale 2 puffs into the lungs 2 (two) times daily. 3 each 3  . celecoxib (CELEBREX) 200 MG capsule TAKE ONE CAPSULE BY MOUTH ONCE DAILY 90 capsule 0  . cetirizine (ZYRTEC) 10 MG tablet Take 1 tablet (10 mg total) by mouth daily. 30 tablet 11  . CHOLECALCIFEROL PO Take 1,000 mg by mouth daily. Vitamin D 3    . clopidogrel (PLAVIX) 75 MG tablet TAKE ONE TABLET BY MOUTH ONCE DAILY 90 tablet 2  . dorzolamide-timolol (COSOPT) 22.3-6.8 MG/ML ophthalmic solution Place 1 drop into both eyes 2 (two) times daily.     . finasteride (PROSCAR) 5 MG tablet Take 5 mg by mouth daily.     . fluticasone (FLONASE) 50 MCG/ACT nasal spray USE TWO SPRAY(S) IN EACH NOSTRIL ONCE DAILY (Patient taking differently: USE TWO SPRAY(S) IN EACH NOSTRIL TWICE DAILY) 48 g 3  . gabapentin (NEURONTIN) 800 MG tablet TAKE ONE TABLET BY MOUTH 4 TIMES DAILY 360 tablet 2  .  Multiple Vitamin (MULTIVITAMIN) tablet Take 1 tablet by mouth daily.      . pantoprazole (PROTONIX) 40 MG tablet  TAKE ONE TABLET BY MOUTH ONCE DAILY 90 tablet 3  . triamterene-hydrochlorothiazide (MAXZIDE-25) 37.5-25 MG tablet TAKE ONE TABLET BY MOUTH ONCE DAILY 90 tablet 3  . tadalafil (CIALIS) 5 MG tablet Take 5 mg by mouth daily. Reported on 03/12/2016    . predniSONE (DELTASONE) 10 MG tablet Prednisone 10 mg: take 4 tabs a day x 3 days; then 3 tabs a day x 4 days; then 2 tabs a day x 4 days, then 1 tab a day x 6 days, then stop. Take pc. 38 tablet 1   No facility-administered medications prior to visit.    ROS Review of Systems  Constitutional: Negative for fever, chills, diaphoresis and fatigue.  HENT: Positive for ear pain and tinnitus. Negative for congestion and ear discharge.   Skin: Negative for rash.    Objective:  BP 120/56 mmHg  Pulse 43  Temp(Src) 97.7 F (36.5 C) (Oral)  Ht 5\' 10"  (1.778 m)  Wt 197 lb 4 oz (89.472 kg)  BMI 28.30 kg/m2  SpO2 98%  Physical Exam  Constitutional: He is oriented to person, place, and time. He appears well-developed and well-nourished. No distress.  HENT:  Head: Normocephalic and atraumatic.  Right Ear: External ear normal.  Left Ear: External ear normal.  Mouth/Throat: Oropharynx is clear and moist. No oropharyngeal exudate.  TMs clear bilaterally  Eyes: EOM are normal. Pupils are equal, round, and reactive to light. Right eye exhibits no discharge. Left eye exhibits no discharge. No scleral icterus.  Neck: Normal range of motion. Neck supple.  Cardiovascular: Normal rate and regular rhythm.   Pulmonary/Chest: Effort normal and breath sounds normal. No respiratory distress. He has no wheezes. He has no rales. He exhibits no tenderness.  Lymphadenopathy:    He has no cervical adenopathy.  Neurological: He is alert and oriented to person, place, and time.  Skin: Skin is warm and dry. No rash noted. He is not diaphoretic.  Psychiatric: He has a normal mood and affect. His behavior is normal. Judgment and thought content normal.   Assessment &  Plan:   Ragen was seen today for otalgia.  Diagnoses and all orders for this visit:  ETD (eustachian tube dysfunction), left  Other orders -     amoxicillin-clavulanate (AUGMENTIN) 875-125 MG tablet; Take 1 tablet by mouth 2 (two) times daily.   I have discontinued Mr. Mckinney predniSONE. I am also having him start on amoxicillin-clavulanate. Additionally, I am having him maintain his aspirin, multivitamin, tadalafil, finasteride, CHOLECALCIFEROL PO, budesonide, bimatoprost, atorvastatin, pantoprazole, dorzolamide-timolol, albuterol, amLODipine, celecoxib, gabapentin, clopidogrel, cetirizine, fluticasone, and triamterene-hydrochlorothiazide.  Meds ordered this encounter  Medications  . amoxicillin-clavulanate (AUGMENTIN) 875-125 MG tablet    Sig: Take 1 tablet by mouth 2 (two) times daily.    Dispense:  20 tablet    Refill:  0    Order Specific Question:  Supervising Provider    Answer:  Crecencio Mc [[id number]]     Follow-up: Return if symptoms worsen or fail to improve.

## 2016-03-15 ENCOUNTER — Telehealth: Payer: Self-pay | Admitting: Internal Medicine

## 2016-03-15 DIAGNOSIS — H9190 Unspecified hearing loss, unspecified ear: Secondary | ICD-10-CM | POA: Insufficient documentation

## 2016-03-15 NOTE — Telephone Encounter (Signed)
Pt has been seen regarding his left ear several times. His last time was on Friday. It is still no better and can't hear out of it. He's thinking he needs to get in to see an ENT He is requesting a referral

## 2016-03-15 NOTE — Telephone Encounter (Signed)
Please advise 

## 2016-03-15 NOTE — Telephone Encounter (Signed)
Referral sent 

## 2016-03-17 DIAGNOSIS — H6980 Other specified disorders of Eustachian tube, unspecified ear: Secondary | ICD-10-CM | POA: Insufficient documentation

## 2016-03-17 MED ORDER — METHYLPREDNISOLONE 4 MG PO TBPK: ORAL_TABLET | ORAL | Status: DC

## 2016-03-17 NOTE — Telephone Encounter (Signed)
Patient called to follow up on referral. Advised that we are waiting to hear back from [facility] at this point. States that he has not seen any improvement on antibiotic. Patient wants to know what he can do in the meantime.

## 2016-03-17 NOTE — Addendum Note (Signed)
Addended by: Janith Lima on: 03/17/2016 04:41 PM   Modules accepted: Orders

## 2016-03-17 NOTE — Telephone Encounter (Signed)
Try a course of steroids Rx sent to his pharmacy

## 2016-03-17 NOTE — Assessment & Plan Note (Signed)
Unsure if this is infection, but due to history will treat with Augmentin twice daily 7 days. Follow-up with PCP if no improvement over the weekend.

## 2016-03-18 ENCOUNTER — Encounter: Payer: Self-pay | Admitting: Internal Medicine

## 2016-03-18 NOTE — Telephone Encounter (Signed)
Informed pt of results. (sarah self, [employer] student  documented this record)

## 2016-03-19 ENCOUNTER — Other Ambulatory Visit (INDEPENDENT_AMBULATORY_CARE_PROVIDER_SITE_OTHER): Payer: Medicare Other

## 2016-03-19 ENCOUNTER — Telehealth: Payer: Self-pay | Admitting: Internal Medicine

## 2016-03-19 ENCOUNTER — Other Ambulatory Visit: Payer: Self-pay | Admitting: Nurse Practitioner

## 2016-03-19 ENCOUNTER — Encounter: Payer: Self-pay | Admitting: Nurse Practitioner

## 2016-03-19 ENCOUNTER — Ambulatory Visit (INDEPENDENT_AMBULATORY_CARE_PROVIDER_SITE_OTHER): Payer: Medicare Other | Admitting: Nurse Practitioner

## 2016-03-19 VITALS — BP 120/58 | HR 40 | Temp 98.3°F | Ht 70.0 in | Wt 194.4 lb

## 2016-03-19 DIAGNOSIS — I10 Essential (primary) hypertension: Secondary | ICD-10-CM

## 2016-03-19 DIAGNOSIS — A09 Infectious gastroenteritis and colitis, unspecified: Secondary | ICD-10-CM

## 2016-03-19 DIAGNOSIS — R197 Diarrhea, unspecified: Secondary | ICD-10-CM

## 2016-03-19 DIAGNOSIS — I25810 Atherosclerosis of coronary artery bypass graft(s) without angina pectoris: Secondary | ICD-10-CM

## 2016-03-19 DIAGNOSIS — E785 Hyperlipidemia, unspecified: Secondary | ICD-10-CM

## 2016-03-19 NOTE — Assessment & Plan Note (Signed)
Pt is very well appearing, no significant findings on exam, symptoms slowed down and seems to be self resolving. Pt has stool kit to bring back for C. Diff PCR if watery diarrhea continues. Strong caution to RTC or seek emergency care if abdominal symptoms worsen, fever, dehydration ect... Pt verbalized understanding  ENT visit on Wed.

## 2016-03-19 NOTE — Progress Notes (Signed)
Pre visit review using our clinic review tool, if applicable. No additional management support is needed unless otherwise documented below in the visit note. 

## 2016-03-19 NOTE — Telephone Encounter (Signed)
I left vm stating I was able to get him in at 1:45 with [facility] and he needs to pick up a stool kit at the lab this morning and bring it back with him this afternoon

## 2016-03-19 NOTE — Progress Notes (Signed)
Patient ID: Derrick [name], male    DOB: June 19, 1941  Age: 75 y.o. MRN: [id number]  CC: Tinnitus and Diarrhea   HPI Derrick [name] presents for CC of diarrhea x 1.5 days.   1) Didn't take evening dosage of antibiotic yesterday Started Align yesterday and took one today  Diarrhea- yesterday and this morning and is slowing down he reports  Profuse watery, odorous in the beginning Now seeming formed and no abdominal cramping today  ENT next week for evaluation of hearing   History Derrick [name] has a past medical history of BENIGN PROSTATIC HYPERTROPHY, WITH OBSTRUCTION (05/28/2010); COLONIC POLYPS, ADENOMATOUS, HX OF (05/28/2010); GENERALIZED OSTEOARTHROSIS UNSPECIFIED SITE (05/28/2010); GERD (05/28/2010); HYPERLIPIDEMIA (05/28/2010); Intrinsic asthma, unspecified (05/28/2010); Malignant neoplasm of descending colon ([place]) (05/28/2010); MITRAL VALVE PROLAPSE (05/28/2010); 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); PALPITATIONS, CHRONIC (05/28/2010); SLEEP APNEA, OBSTRUCTIVE, MODERATE (05/28/2010); UNSPECIFIED INFLAMMATORY AND TOXIC NEUROPATHY (05/28/2010); VITAMIN D DEFICIENCY (05/28/2010); CAD (coronary artery disease); Monoclonal gammopathy of undetermined significance (12/11/2011); Coronary artery disease (12/11/2011); MI (myocardial infarction) ([place]) (12/11/2011); Vision loss; CIDP (chronic inflammatory demyelinating polyneuropathy) ([place]) (04/11/2012); Sarcoidosis ([place]); Hypertension; Parotid gland pain; Glaucoma (12/2012); COPD (chronic obstructive pulmonary disease) ([place]); Diverticulitis; Colon cancer ([place]) (2000); Pneumonia; Sleep apnea; 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   He has past surgical history that includes Tonsillectomy (1950); Colon surgery (2000); lung wedge (2001); Appendectomy (1954); Knee surgery (2003); heart attack (06/03/11); Cardiac catheterization (06/03/11); Cataract extraction; and left heart catheterization with coronary angiogram (N/A, 03/31/2015).    His family history includes Arthritis in his mother; Cancer in his brother; Colon polyps in his mother; Coronary artery disease in his brother and mother.He reports that he has never smoked. He has never used smokeless tobacco. He reports that he drinks alcohol. He reports that he does not use illicit drugs.  Outpatient Prescriptions Prior to Visit  Medication Sig Dispense Refill  . albuterol (PROVENTIL HFA;VENTOLIN HFA) 108 (90 BASE) MCG/ACT inhaler Inhale 2 puffs into the lungs every 6 (six) hours as needed for wheezing or shortness of breath.    [name] amLODipine (NORVASC) 10 MG tablet TAKE ONE TABLET BY MOUTH ONCE DAILY 90 tablet 0  . aspirin 81 MG tablet Take 81 mg by mouth daily.      [name] atorvastatin (LIPITOR) 20 MG tablet TAKE ONE TABLET BY MOUTH ONCE DAILY 90 tablet 3  . bimatoprost (LUMIGAN) 0.01 % SOLN Place 1 drop into both eyes at bedtime.    . budesonide (PULMICORT) 180 MCG/ACT inhaler Inhale 2 puffs into the lungs 2 (two) times daily. 3 each 3  . celecoxib (CELEBREX) 200 MG capsule TAKE ONE CAPSULE BY MOUTH ONCE DAILY 90 capsule 0  . cetirizine (ZYRTEC) 10 MG tablet Take 1 tablet (10 mg total) by mouth daily. 30 tablet 11  . CHOLECALCIFEROL PO Take 1,000 mg by mouth daily. Vitamin D 3    . clopidogrel (PLAVIX) 75 MG tablet TAKE ONE TABLET BY MOUTH ONCE DAILY 90 tablet 2  . dorzolamide-timolol (COSOPT) 22.3-6.8 MG/ML ophthalmic solution Place 1 drop into both eyes 2 (two) times daily.     . finasteride (PROSCAR) 5 MG tablet Take 5 mg by mouth daily.     . fluticasone (FLONASE) 50 MCG/ACT nasal spray USE TWO SPRAY(S) IN EACH NOSTRIL ONCE DAILY (Patient taking differently: USE TWO SPRAY(S) IN EACH NOSTRIL TWICE DAILY) 48 g 3  . gabapentin (NEURONTIN) 800 MG tablet TAKE ONE TABLET BY MOUTH 4 TIMES DAILY 360 tablet 2  . methylPREDNISolone (MEDROL DOSEPAK) 4 MG TBPK tablet  TAKE AS DIRECTED 21 tablet 0  . Multiple Vitamin (MULTIVITAMIN) tablet Take 1 tablet by mouth daily.      .  pantoprazole (PROTONIX) 40 MG tablet TAKE ONE TABLET BY MOUTH ONCE DAILY 90 tablet 3  . tadalafil (CIALIS) 5 MG tablet Take 5 mg by mouth daily. Reported on 03/12/2016    . triamterene-hydrochlorothiazide (MAXZIDE-25) 37.5-25 MG tablet TAKE ONE TABLET BY MOUTH ONCE DAILY 90 tablet 3  . amoxicillin-clavulanate (AUGMENTIN) 875-125 MG tablet Take 1 tablet by mouth 2 (two) times daily. 20 tablet 0   No facility-administered medications prior to visit.    ROS Review of Systems  Constitutional: Negative for fever, chills, diaphoresis and fatigue.  Respiratory: Negative for chest tightness, shortness of breath and wheezing.   Cardiovascular: Negative for chest pain, palpitations and leg swelling.  Gastrointestinal: Positive for diarrhea. Negative for nausea, vomiting, abdominal pain, constipation, blood in stool, abdominal distention, anal bleeding and rectal pain.  Skin: Negative for rash.  Neurological: Negative for dizziness.    Objective:  BP 120/58 mmHg  Pulse 40  Temp(Src) 98.3 F (36.8 C) (Oral)  Ht 5\' 10"  (1.778 m)  Wt 194 lb 6 oz (88.168 kg)  BMI 27.89 kg/m2  SpO2 90%  Physical Exam  Constitutional: He is oriented to person, place, and time. He appears well-developed and well-nourished. No distress.  HENT:  Head: Normocephalic and atraumatic.  Right Ear: External ear normal.  Left Ear: External ear normal.  Cardiovascular: Normal rate and regular rhythm.   Pulmonary/Chest: Effort normal and breath sounds normal. No respiratory distress. He has no wheezes. He has no rales. He exhibits no tenderness.  Abdominal: Soft. Bowel sounds are normal. He exhibits no distension and no mass. There is no tenderness. There is no rebound and no guarding.  Neurological: He is alert and oriented to person, place, and time.  Skin: Skin is warm and dry. No rash noted. He is not diaphoretic.  Psychiatric: He has a normal mood and affect. His behavior is normal. Judgment and thought content normal.    Assessment & Plan:   There are no diagnoses linked to this encounter. I have discontinued Mr. Treto amoxicillin-clavulanate. I am also having him maintain his aspirin, multivitamin, tadalafil, finasteride, CHOLECALCIFEROL PO, budesonide, bimatoprost, atorvastatin, pantoprazole, dorzolamide-timolol, albuterol, amLODipine, celecoxib, gabapentin, clopidogrel, cetirizine, fluticasone, triamterene-hydrochlorothiazide, and methylPREDNISolone.  No orders of the defined types were placed in this encounter.     Follow-up: No Follow-up on file.

## 2016-03-24 LAB — LIPID PANEL
Cholesterol: 139 mg/dL (ref 125–200)
HDL: 51 mg/dL (ref >=40)
LDL Cholesterol: 74 mg/dL (ref <130)
Total CHOL/HDL Ratio: 2.7 Ratio (ref <=5.0)
Triglycerides: 70 mg/dL (ref <150)
VLDL: 14 mg/dL (ref <30)

## 2016-03-24 LAB — HEPATIC FUNCTION PANEL
ALT: 32 U/L (ref 9–46)
AST: 26 U/L (ref 10–35)
Albumin: 4 g/dL (ref 3.6–5.1)
Alkaline Phosphatase: 46 U/L (ref 40–115)
Bilirubin, Direct: 0.2 mg/dL (ref <=0.2)
Indirect Bilirubin: 0.7 mg/dL (ref 0.2–1.2)
Total Bilirubin: 0.9 mg/dL (ref 0.2–1.2)
Total Protein: 6.5 g/dL (ref 6.1–8.1)

## 2016-03-25 DIAGNOSIS — G4733 Obstructive sleep apnea (adult) (pediatric): Secondary | ICD-10-CM | POA: Diagnosis not present

## 2016-03-25 DIAGNOSIS — H9122 Sudden idiopathic hearing loss, left ear: Secondary | ICD-10-CM | POA: Diagnosis not present

## 2016-03-25 DIAGNOSIS — K1379 Other lesions of oral mucosa: Secondary | ICD-10-CM | POA: Diagnosis not present

## 2016-03-25 DIAGNOSIS — H903 Sensorineural hearing loss, bilateral: Secondary | ICD-10-CM | POA: Diagnosis not present

## 2016-03-25 DIAGNOSIS — H9313 Tinnitus, bilateral: Secondary | ICD-10-CM | POA: Diagnosis not present

## 2016-03-25 DIAGNOSIS — Z9989 Dependence on other enabling machines and devices: Secondary | ICD-10-CM | POA: Diagnosis not present

## 2016-03-26 ENCOUNTER — Encounter: Payer: Self-pay | Admitting: Cardiovascular Disease

## 2016-03-26 ENCOUNTER — Ambulatory Visit (INDEPENDENT_AMBULATORY_CARE_PROVIDER_SITE_OTHER): Payer: Medicare Other | Admitting: Cardiovascular Disease

## 2016-03-26 VITALS — BP 124/70 | HR 60 | Ht 70.0 in | Wt 195.4 lb

## 2016-03-26 DIAGNOSIS — I4891 Unspecified atrial fibrillation: Secondary | ICD-10-CM | POA: Diagnosis not present

## 2016-03-26 DIAGNOSIS — I25810 Atherosclerosis of coronary artery bypass graft(s) without angina pectoris: Secondary | ICD-10-CM

## 2016-03-26 MED ORDER — PANTOPRAZOLE SODIUM 40 MG PO TBEC: 40.0000 mg | DELAYED_RELEASE_TABLET | Freq: Every day | ORAL | Status: DC

## 2016-03-26 MED ORDER — APIXABAN 5 MG PO TABS: 5.0000 mg | ORAL_TABLET | Freq: Two times a day (BID) | ORAL | Status: DC

## 2016-03-26 MED ORDER — ATORVASTATIN CALCIUM 20 MG PO TABS: 20.0000 mg | ORAL_TABLET | Freq: Every day | ORAL | Status: DC

## 2016-03-26 NOTE — Progress Notes (Signed)
Cardiology Office Note Date:  03/26/2016   ID:  Derrick [name], DOB [birth year], MRN [id number]  PCP:  Derrick Calico, MD  Cardiologist:  Derrick Mocha, MD    Chief Complaint  Patient presents with  . Coronary Artery Disease    c/o sob. denies any cp, le edema, or claudication  . Hypertension     History of Present Illness: Derrick [name] is a 75 y.o. male who presents for follow-up evaluation.   He has been followed for coronary artery disease. He has a history of myocardial infarction and drug-eluting stent placement in the right coronary artery. 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 He's been managed medically. He has multiple noncardiac problems including CIDP, pulmonary sarcoid, and monoclonal gammopathy of undetermined significance. He last underwent cardiac catheterization in April 2016 showing stable coronary anatomy.   Since his last visit, he complains of fatigue and shortness of breath with exertion. States the shortness of breath is mild, but he definitely has less energy than he denies heart palpitations. He has noticed that his heart rate is slower than in the past. He frequently gets irregular heart rates in the 40s. He denies edema, orthopnea, PND, lightheadedness, or syncope. He did have an episode of left upper back pain associated with walking.  Past Medical History  Diagnosis Date  . BENIGN PROSTATIC HYPERTROPHY, WITH OBSTRUCTION 05/28/2010  . COLONIC POLYPS, ADENOMATOUS, HX OF 05/28/2010  . GENERALIZED OSTEOARTHROSIS UNSPECIFIED SITE 05/28/2010  . GERD 05/28/2010  . HYPERLIPIDEMIA 05/28/2010  . Intrinsic asthma, 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  . Malignant neoplasm of descending colon ([place]) 05/28/2010  . MITRAL VALVE PROLAPSE 05/28/2010  . 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 PALPITATIONS, CHRONIC 05/28/2010  . SLEEP APNEA, OBSTRUCTIVE, MODERATE 05/28/2010  . UNSPECIFIED INFLAMMATORY AND TOXIC NEUROPATHY 05/28/2010  . VITAMIN D  DEFICIENCY 05/28/2010  . CAD (coronary artery disease)     a. s/p NSTEMI 06/01/11: DES to RCA;  b. cath 06/25/11:   dLM 50-60% (FFR 0.87), prox to mid LAD 40-50%, D1 50%, pCFX 50%, RCA stent ok, dPDA 80%, EF 55-60%.  His FFR was felt to be negative and therefore medical therapy was recommended ;  echo 6/12: EF 55-60%, mild AS   . 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 Coronary artery disease 12/11/2011  . MI (myocardial infarction) ([place]) 12/11/2011  . Vision loss   . CIDP (chronic inflammatory demyelinating polyneuropathy) ([place]) 04/11/2012  . Sarcoidosis ([place])     pulmonalis  . Hypertension   . Parotid gland pain     infection  . 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 COPD (chronic obstructive pulmonary disease) ([place])   . Diverticulitis   . Colon cancer ([place]) 2000  . Pneumonia   . Sleep apnea   . Diffuse axonal neuropathy ([place]) 10/16/2014  . DISH (diffuse idiopathic skeletal hyperostosis) 12/02/2015    Past Surgical History  Procedure Laterality Date  . Tonsillectomy  1950  . Colon surgery  2000    decending colon 200  . Lung wedge  2001  . Appendectomy  1954  . Knee surgery  2003  . Heart attack  06/03/11  . Cardiac catheterization  06/03/11    stent  . Cataract extraction    . Left heart catheterization with coronary angiogram N/A 03/31/2015    Procedure: LEFT HEART CATHETERIZATION WITH CORONARY ANGIOGRAM;  Surgeon: Derrick Mocha, MD;  Location[facility] CATH LAB;  Service: Cardiovascular;  Laterality: N/A;    Current Outpatient  Prescriptions  Medication Sig Dispense Refill  . albuterol (PROVENTIL HFA;VENTOLIN HFA) 108 (90 BASE) MCG/ACT inhaler Inhale 2 puffs into the lungs every 6 (six) hours as needed for wheezing or shortness of breath.    [name] amLODipine (NORVASC) 10 MG tablet TAKE ONE TABLET BY MOUTH ONCE DAILY 90 tablet 0  . aspirin 81 MG tablet Take 81 mg by mouth daily.      [name] atorvastatin (LIPITOR) 20 MG tablet TAKE ONE TABLET BY MOUTH ONCE DAILY 90 tablet 3  .  bimatoprost (LUMIGAN) 0.01 % SOLN Place 1 drop into both eyes at bedtime.    . budesonide (PULMICORT) 180 MCG/ACT inhaler Inhale 2 puffs into the lungs 2 (two) times daily. 3 each 3  . celecoxib (CELEBREX) 200 MG capsule TAKE ONE CAPSULE BY MOUTH ONCE DAILY 90 capsule 0  . cetirizine (ZYRTEC) 10 MG tablet Take 1 tablet (10 mg total) by mouth daily. 30 tablet 11  . CHOLECALCIFEROL PO Take 1,000 mg by mouth daily. Vitamin D 3    . clopidogrel (PLAVIX) 75 MG tablet TAKE ONE TABLET BY MOUTH ONCE DAILY 90 tablet 2  . dorzolamide-timolol (COSOPT) 22.3-6.8 MG/ML ophthalmic solution Place 1 drop into both eyes 2 (two) times daily.     . finasteride (PROSCAR) 5 MG tablet Take 5 mg by mouth daily.     . fluticasone (FLONASE) 50 MCG/ACT nasal spray Place 2 sprays into both nostrils 2 (two) times daily.    [name] gabapentin (NEURONTIN) 800 MG tablet TAKE ONE TABLET BY MOUTH 4 TIMES DAILY 360 tablet 2  . Multiple Vitamin (MULTIVITAMIN) tablet Take 1 tablet by mouth daily.      . pantoprazole (PROTONIX) 40 MG tablet TAKE ONE TABLET BY MOUTH ONCE DAILY 90 tablet 3  . tadalafil (CIALIS) 5 MG tablet Take 5 mg by mouth daily. Reported on 03/12/2016    . triamterene-hydrochlorothiazide (MAXZIDE-25) 37.5-25 MG tablet TAKE ONE TABLET BY MOUTH ONCE DAILY 90 tablet 3   No current facility-administered medications for this visit.    Allergies:   Review of patient's allergies indicates no known allergies.   Social History:  The patient  reports that he has never smoked. He has never used smokeless tobacco. He reports that he drinks alcohol. He reports that he does not use illicit drugs.   Family History:  The patient's family history includes Arthritis in his mother; Cancer in his brother; Colon polyps in his mother; Coronary artery disease in his brother and mother.   ROS:  Please see the history of present illness.  Otherwise, review of systems is positive for Chills, hearing loss, visual changes, back pain, muscle  pain,.  All other systems are reviewed and negative.   PHYSICAL EXAM: VS:  BP 124/70 mmHg  Pulse 60  Ht 5\' 10"  (1.778 m)  Wt 195 lb 6.4 oz (88.633 kg)  BMI 28.04 kg/m2 , BMI Body mass index is 28.04 kg/(m^2). GEN: Well nourished, well developed, in no acute distress HEENT: normal Neck: no JVD, no masses. No carotid bruits Cardiac: Irregularly irregular with 2/6 harsh mid peaking systolic murmur at the right upper sternal border                Respiratory:  clear to auscultation bilaterally, normal work of breathing GI: soft, nontender, nondistended, + BS MS: no deformity or atrophy Ext: no pretibial edema, pedal pulses 2+= bilaterally Skin: warm and dry, no rash Neuro:  Strength and sensation are intact Psych: euthymic mood, full affect  EKG:  EKG is ordered today. The ekg ordered today shows atrial fibrillation with slow ventricular response, heart rate 58 bpm, cannot rule out anterior infarct age undetermined, otherwise within normal limits.  Recent Labs: 02/09/2016: Hemoglobin 14.8; Platelets 195.0; TSH 2.47 02/16/2016: BUN 18; Creatinine, Ser 1.05; Potassium 4.0; Sodium 143 03/19/2016: ALT 32   Lipid Panel     Component Value Date/Time   CHOL 139 03/19/2016 0922   TRIG 70 03/19/2016 0922   HDL 51 03/19/2016 0922   CHOLHDL 2.7 03/19/2016 0922   VLDL 14 03/19/2016 0922   LDLCALC 74 03/19/2016 0922   Wt Readings from Last 3 Encounters:  03/26/16 195 lb 6.4 oz (88.633 kg)  03/19/16 194 lb 6 oz (88.168 kg)  03/12/16 197 lb 4 oz (89.472 kg)    Cardiac Studies Reviewed: Cath 03/31/2015: Left mainstem: The left mainstem is patent. The proximal and mid left main have no significant obstruction. The distal left main has 60% stenosis without significant change from previous studies. The vessel divides into the LAD and left circumflex.  Left anterior descending (LAD): The LAD is heavily calcified. The proximal LAD has 60% heavy calcific stenosis. This involves the ostium of the  vessel. The diagonal branches are small to medium in caliber. The ostium of the first diagonal has 80% stenosis. The second and third diagonals have no significant disease. The mid LAD is diffusely diseased with 50% stenosis. The LAD wraps the LV apex.  Left circumflex (LCx): The left circumflex is also calcified. The vessel begins with an acute angulation from the left main. The OM branches are patent. The proximal circumflex has 40-50% stenosis.  Right coronary artery (RCA): The RCA is dominant. The vessel is moderately calcified. The stented segment through the proximal and mid vessel is widely patent without significant stenosis. The ostium of the RCA has 20-30% stenosis. The distal RCA is diffusely diseased with 30-40% stenosis. The PLA branch is patent. The PDA branch has a 70% stenosis in its midportion. The vessel is fairly small through this region.  Left ventriculography: Left ventricular systolic function is normal, LVEF is estimated at 55-65%, there is no significant mitral regurgitation   Contrast: 70 cc Omnipaque  Estimated Blood Loss: Minimal  Final Conclusions:  1. Moderate distal left mainstem disease, without significant progression from the patient's 2012 cardiac catheterization study 2. Mild to moderate diffusely calcified stenosis of the LAD 3. Mild nonobstructive stenosis of the left circumflex 4. Continued stent patency of the RCA with moderate PDA stenosis 5. Preserved LV systolic function  Recommendations: I have carefully reviewed the patients cardiac catheterization study and compared it to his previous study from 2012. While his stress test was abnormal, he is not limited by symptoms of angina. I would recommend ongoing medical therapy as his only other option for treatment would be multivessel CABG. Certainly, if he has progressive symptoms, cardiac surgery could be considered  ASSESSMENT AND PLAN: 1. CAD, native vessel: atypical angina. Most recent cath reviewed  and the patient appears stable. See below for discussion of antiplatelet Rx.  2. Essential HTN: BP controlled on current medications  3. Hyperlipidemia: on statin drug. Last labs reviewed.   4. Atrial fibrillation with slow ventricular response: This is a new diagnosis for the patient. Unclear how long he has been in atrial fibrillation. He does have decreased exercise tolerance and it may be associated with atrial fibrillation. We reviewed strategies around rate control, rhythm control, and anticoagulation. The patient's CHADS-Vasc score is 4 (HTN, CAD, age). He  will stop aspirin and Plavix and started on Eliquis 5 mg twice a day. We'll check a 2-D echo to evaluate LV function, valvular disease, and left atrial size. Will check a 24-hour Holter monitor because of his reported low heart rates.  Current medicines are reviewed with the patient today.  The patient does not have concerns regarding medicines.  Labs/ tests ordered today include:  No orders of the defined types were placed in this encounter.   Disposition:   FU pending study results.   Deatra James, MD  03/26/2016 11:41 AM    [facility] [address], [place], [place]  [address] Phone: [phone]; Fax: [phone]

## 2016-03-26 NOTE — Patient Instructions (Signed)
Medication Instructions:  Your physician has recommended you make the following change in your medication:  1. STOP Aspirin 2. STOP Plavix 3. START Eliquis 5mg  take one tablet by mouth twice a day  Labwork: No new orders.   Testing/Procedures: Your physician has requested that you have an echocardiogram. Echocardiography is a painless test that uses sound waves to create images of your heart. It provides your doctor with information about the size and shape of your heart and how well your heart's chambers and valves are working. This procedure takes approximately one hour. There are no restrictions for this procedure.  Your physician has recommended that you wear a 48 hour holter monitor. Holter monitors are medical devices that record the heart's electrical activity. Doctors most often use these monitors to diagnose arrhythmias. Arrhythmias are problems with the speed or rhythm of the heartbeat. The monitor is a small, portable device. You can wear one while you do your normal daily activities. This is usually used to diagnose what is causing palpitations/syncope (passing out).  Follow-Up: We will arrange further follow-up after Echo and holter monitor results.   Any Other Special Instructions Will Be Listed Below (If Applicable).     If you need a refill on your cardiac medications before your next appointment, please call your pharmacy.

## 2016-03-28 ENCOUNTER — Other Ambulatory Visit: Payer: Self-pay | Admitting: Internal Medicine

## 2016-03-31 ENCOUNTER — Ambulatory Visit (INDEPENDENT_AMBULATORY_CARE_PROVIDER_SITE_OTHER): Payer: Medicare Other

## 2016-03-31 DIAGNOSIS — I4891 Unspecified atrial fibrillation: Secondary | ICD-10-CM | POA: Diagnosis not present

## 2016-04-02 DIAGNOSIS — Z0000 Encounter for general adult medical examination without abnormal findings: Secondary | ICD-10-CM | POA: Diagnosis not present

## 2016-04-02 DIAGNOSIS — R310 Gross hematuria: Secondary | ICD-10-CM | POA: Diagnosis not present

## 2016-04-05 ENCOUNTER — Telehealth: Payer: Self-pay

## 2016-04-05 ENCOUNTER — Telehealth: Payer: Self-pay | Admitting: Cardiovascular Disease

## 2016-04-05 ENCOUNTER — Encounter: Payer: Self-pay | Admitting: Cardiovascular Disease

## 2016-04-05 NOTE — Telephone Encounter (Signed)
New message   *STAT* If patient is at the pharmacy, call can be transferred to refill team.   1. Which medications need to be refilled? (please list name of each medication and dose if known) Elequis 2. Which pharmacy/location (including street and city if local pharmacy) is medication to be sent to?  3. Do they need a 30 day or 90 day supply? 8   Pt wants preauthorization

## 2016-04-05 NOTE — Telephone Encounter (Signed)
Pt called wondering if she should stop taking the Celebrex (takes for back and leg pain) as it can affect his Eliquis. Told would forward to MD for review and we will get back with him once we hear back. Pt stated okay to send response via mychart.   Pt also stated he requested refill on his Eliquis and was told when when to pick it up he needs a PA completed. Pt stated he has about a weeks worth so samples left while waiting on process to be completed. Told would forward to PA department for review.

## 2016-04-05 NOTE — Telephone Encounter (Signed)
New message   Pt has a question: pt has A-Fib should he get off the Celebrex?

## 2016-04-05 NOTE — Telephone Encounter (Signed)
Prior auth request for Eliquis 5 mg submitted to [facility].

## 2016-04-06 ENCOUNTER — Telehealth: Payer: Self-pay

## 2016-04-06 ENCOUNTER — Telehealth: Payer: Self-pay | Admitting: Cardiovascular Disease

## 2016-04-06 NOTE — Telephone Encounter (Signed)
[employer] called today to report  pt has an episode of 10 beats of VT on 03/31/16. Pt had a 48 hours Holter monitor. Pt states he has had some pain on left side of his chest,and  occasional feels  the skip heart beats,  he feels tired, other wide he is okay. Pt returned is Holter monitor on last Friday 4/7. Dr Caryl Comes DOD aware MD's NOTE on Holter monitor copy. 1)EF 65 % 2015 2).ECHO PENDING 3). NOT SURE OF VT VS ABERRANCE. IF EF OKAY NTD (NOTHING TO DO).

## 2016-04-06 NOTE — Telephone Encounter (Signed)
Eliquis 5 mg approved through 12/26/2016. [id number].

## 2016-04-06 NOTE — Telephone Encounter (Signed)
I have responded to the pt through My Chart in regards to Celebrex.

## 2016-04-06 NOTE — Telephone Encounter (Signed)
Derrick [name] is calling in to report a critical reading for a holter.

## 2016-04-07 NOTE — Telephone Encounter (Signed)
Brett Canales, LPN at [id number] AM     Status: Signed       Expand All Collapse All   Eliquis 5 mg approved through 12/26/2016. [id number].

## 2016-04-13 ENCOUNTER — Ambulatory Visit (HOSPITAL_COMMUNITY): Payer: Medicare Other | Attending: Internal Medicine

## 2016-04-13 ENCOUNTER — Other Ambulatory Visit: Payer: Self-pay

## 2016-04-13 DIAGNOSIS — I071 Rheumatic tricuspid insufficiency: Secondary | ICD-10-CM | POA: Diagnosis not present

## 2016-04-13 DIAGNOSIS — I77810 Thoracic aortic ectasia: Secondary | ICD-10-CM | POA: Diagnosis not present

## 2016-04-13 DIAGNOSIS — I4891 Unspecified atrial fibrillation: Secondary | ICD-10-CM | POA: Insufficient documentation

## 2016-04-13 DIAGNOSIS — G4733 Obstructive sleep apnea (adult) (pediatric): Secondary | ICD-10-CM | POA: Diagnosis not present

## 2016-04-13 DIAGNOSIS — J449 Chronic obstructive pulmonary disease, unspecified: Secondary | ICD-10-CM | POA: Insufficient documentation

## 2016-04-13 DIAGNOSIS — I340 Nonrheumatic mitral (valve) insufficiency: Secondary | ICD-10-CM | POA: Insufficient documentation

## 2016-04-13 DIAGNOSIS — I350 Nonrheumatic aortic (valve) stenosis: Secondary | ICD-10-CM | POA: Insufficient documentation

## 2016-04-13 DIAGNOSIS — E785 Hyperlipidemia, unspecified: Secondary | ICD-10-CM | POA: Insufficient documentation

## 2016-04-13 DIAGNOSIS — Z8249 Family history of ischemic heart disease and other diseases of the circulatory system: Secondary | ICD-10-CM | POA: Insufficient documentation

## 2016-04-14 DIAGNOSIS — M1812 Unilateral primary osteoarthritis of first carpometacarpal joint, left hand: Secondary | ICD-10-CM | POA: Diagnosis not present

## 2016-04-14 DIAGNOSIS — M19049 Primary osteoarthritis, unspecified hand: Secondary | ICD-10-CM | POA: Diagnosis not present

## 2016-04-15 ENCOUNTER — Encounter: Payer: Self-pay | Admitting: Cardiovascular Disease

## 2016-04-15 NOTE — Telephone Encounter (Signed)
Pt advised echo done 04/13/16 not reviewed yet.   Pt advised I will forward a message to Dr Burt Knack letting him know pt asking about follow up now that echocardiogram as been completed.

## 2016-04-19 ENCOUNTER — Other Ambulatory Visit: Payer: Self-pay

## 2016-04-26 NOTE — Progress Notes (Signed)
Electrophysiology Office Note   Date:  04/27/2016   ID:  Derrick [name], DOB 31-Dec-1940, MRN [id number]  PCP:  [name], MD  Cardiologist:  Burt Knack Primary Electrophysiologist:  Annais Crafts [name], MD    Chief Complaint  Patient presents with  . Atrial Fibrillation     History of Present Illness: Derrick [name] is a 75 y.o. male who presents today for electrophysiology evaluation.   He has been followed for coronary artery disease. He has a history of myocardial infarction and drug-eluting stent placement in the right coronary artery. 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 He's been managed medically. He has multiple noncardiac problems including CIDP, pulmonary sarcoid, and monoclonal gammopathy of undetermined significance. He last underwent cardiac catheterization in April 2016 showing stable coronary anatomy.    Today, he denies symptoms of chest pain, orthopnea, PND, lower extremity edema, claudication, dizziness, presyncope, syncope, bleeding, or neurologic sequela. The patient is tolerating medications without difficulties. He says that he thinks he's been in atrial fibrillation for quite a while now. His main symptom is fatigue and shortness of breath. He feels like he cannot do the activities that he usually does. For a while, he was in and out of rhythm. He could tell that when he was in sinus, he had quite a bit more energy and was able to do more activity. He does have sleep apnea, and wears a CPAP.   Past Medical History  Diagnosis Date  . BENIGN PROSTATIC HYPERTROPHY, WITH OBSTRUCTION 05/28/2010  . COLONIC POLYPS, ADENOMATOUS, HX OF 05/28/2010  . GENERALIZED OSTEOARTHROSIS UNSPECIFIED SITE 05/28/2010  . GERD 05/28/2010  . HYPERLIPIDEMIA 05/28/2010  . Intrinsic asthma, 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  . Malignant neoplasm of descending colon ([place]) 05/28/2010  . MITRAL VALVE PROLAPSE 05/28/2010  . 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 PALPITATIONS, CHRONIC 05/28/2010  . SLEEP APNEA, OBSTRUCTIVE, MODERATE 05/28/2010  . UNSPECIFIED INFLAMMATORY AND TOXIC NEUROPATHY 05/28/2010  . VITAMIN D DEFICIENCY 05/28/2010  . CAD (coronary artery disease)     a. s/p NSTEMI 06/01/11: DES to RCA;  b. cath 06/25/11:   dLM 50-60% (FFR 0.87), prox to mid LAD 40-50%, D1 50%, pCFX 50%, RCA stent ok, dPDA 80%, EF 55-60%.  His FFR was felt to be negative and therefore medical therapy was recommended ;  echo 6/12: EF 55-60%, mild AS   . 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 Coronary artery disease 12/11/2011  . MI (myocardial infarction) ([place]) 12/11/2011  . Vision loss   . 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 Sarcoidosis ([place])     pulmonalis  . Hypertension   . Parotid gland pain     infection  . 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 COPD (chronic obstructive pulmonary disease) ([place])   . Diverticulitis   . Colon cancer ([place]) 2000  . Pneumonia   . Sleep apnea   . 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 DISH (diffuse idiopathic skeletal hyperostosis) 12/02/2015   Past Surgical History  Procedure Laterality Date  . Tonsillectomy  1950  . Colon surgery  2000    decending colon 200  . Lung wedge  2001  . Appendectomy  1954  . Knee surgery  2003  . Heart attack  06/03/11  . Cardiac catheterization  06/03/11    stent  . Cataract extraction    . Left heart catheterization with coronary angiogram N/A 03/31/2015    Procedure: LEFT HEART CATHETERIZATION  WITH CORONARY ANGIOGRAM;  Surgeon: [name], MD;  Location: [facility] CATH LAB;  Service: Cardiovascular;  Laterality: N/A;     Current Outpatient Prescriptions  Medication Sig Dispense Refill  . albuterol (PROVENTIL HFA;VENTOLIN HFA) 108 (90 BASE) MCG/ACT inhaler Inhale 2 puffs into the lungs every 6 (six) hours as needed for wheezing or shortness of breath.    [name] amLODipine (NORVASC) 10 MG tablet TAKE ONE TABLET BY MOUTH ONCE DAILY 90  tablet 0  . apixaban (ELIQUIS) 5 MG TABS tablet Take 1 tablet (5 mg total) by mouth 2 (two) times daily. 180 tablet 3  . atorvastatin (LIPITOR) 20 MG tablet Take 1 tablet (20 mg total) by mouth daily. 90 tablet 3  . bimatoprost (LUMIGAN) 0.01 % SOLN Place 1 drop into both eyes at bedtime.    . budesonide (PULMICORT) 180 MCG/ACT inhaler Inhale 2 puffs into the lungs 2 (two) times daily. 3 each 3  . cetirizine (ZYRTEC) 10 MG tablet Take 1 tablet (10 mg total) by mouth daily. 30 tablet 11  . CHOLECALCIFEROL PO Take 1,000 mg by mouth daily. Vitamin D 3    . dorzolamide-timolol (COSOPT) 22.3-6.8 MG/ML ophthalmic solution Place 1 drop into both eyes 2 (two) times daily.     . finasteride (PROSCAR) 5 MG tablet Take 5 mg by mouth daily.     . fluticasone (FLONASE) 50 MCG/ACT nasal spray Place 2 sprays into both nostrils 2 (two) times daily.    [name] gabapentin (NEURONTIN) 800 MG tablet TAKE ONE TABLET BY MOUTH 4 TIMES DAILY 360 tablet 2  . Multiple Vitamin (MULTIVITAMIN) tablet Take 1 tablet by mouth daily.      . pantoprazole (PROTONIX) 40 MG tablet Take 1 tablet (40 mg total) by mouth daily. 90 tablet 3  . tadalafil (CIALIS) 5 MG tablet Take 5 mg by mouth daily. Reported on 03/12/2016    . triamterene-hydrochlorothiazide (MAXZIDE-25) 37.5-25 MG tablet TAKE ONE TABLET BY MOUTH ONCE DAILY 90 tablet 3   No current facility-administered medications for this visit.    Allergies:   Review of patient's allergies indicates no known allergies.   Social History:  The patient  reports that he has never smoked. He has never used smokeless tobacco. He reports that he drinks alcohol. He reports that he does not use illicit drugs.   Family History:  The patient's family history includes Arrhythmia (age of onset: 64) in his brother; Arthritis in his mother; Cancer in his brother; Colon polyps in his mother; Coronary artery disease in his brother and mother.    ROS:  Please see the history of present illness.    Otherwise, review of systems is positive for weight change, fatigue, chest pain, SOB, palpitations, leg pain, cough, snoring, back, muscle pain, walking problems.   All other systems are reviewed and negative.    PHYSICAL EXAM: VS:  BP 132/74 mmHg  Pulse 50  Ht 5\' 10"  (1.778 m)  Wt 196 lb 12.8 oz (89.268 kg)  BMI 28.24 kg/m2 , BMI Body mass index is 28.24 kg/(m^2). GEN: Well nourished, well developed, in no acute distress HEENT: normal Neck: no JVD, carotid bruits, or masses Cardiac: bradycardic, irregular; no murmurs, rubs, or gallops,no edema  Respiratory:  clear to auscultation bilaterally, normal work of breathing GI: soft, nontender, nondistended, + BS MS: no deformity or atrophy Skin: warm and dry Neuro:  Strength and sensation are intact Psych: euthymic mood, full affect  EKG:  EKG is ordered today. The ekg ordered today shows atrial fibrillation,  rate 50, septal Q waves   Recent Labs: 02/09/2016: Hemoglobin 14.8; Platelets 195.0; TSH 2.47 02/16/2016: BUN 18; Creatinine, Ser 1.05; Potassium 4.0; Sodium 143 03/19/2016: ALT 32    Lipid Panel     Component Value Date/Time   CHOL 139 03/19/2016 0922   TRIG 70 03/19/2016 0922   HDL 51 03/19/2016 0922   CHOLHDL 2.7 03/19/2016 0922   VLDL 14 03/19/2016 0922   LDLCALC 74 03/19/2016 0922     Wt Readings from Last 3 Encounters:  04/27/16 196 lb 12.8 oz (89.268 kg)  03/26/16 195 lb 6.4 oz (88.633 kg)  03/19/16 194 lb 6 oz (88.168 kg)      Other studies Reviewed: Additional studies/ records that were reviewed today include: 03/31/15 Cath, 04/13/16 TTE, 03/31/16 Holter Review of the above records today demonstrates:   Final Conclusions:  1. Moderate distal left mainstem disease, without significant progression from the patient's 2012 cardiac catheterization study 2. Mild to moderate diffusely calcified stenosis of the LAD 3. Mild nonobstructive stenosis of the left circumflex 4. Continued stent patency of the RCA with  moderate PDA stenosis 5. Preserved LV systolic function  - Left ventricle: The cavity size was normal. Wall thickness was  normal. Systolic function was vigorous. The estimated ejection  fraction was in the range of 65% to 70%. Wall motion was normal;  there were no regional wall motion abnormalities. The study is  not technically sufficient to allow evaluation of LV diastolic  function. - Aortic valve: Trileaflet; mildly calcified leaflets. Mild  stenosis. Mean gradient (S): 12 mm Hg. Peak gradient (S): 22 mm  Hg. Valve area (VTI): 2.58 cm^2. Valve area (Vmax): 2.92 cm^2.  Valve area (Vmean): 2.91 cm^2. - Aorta: Ascending aortic diameter: 41 mm (S). - Ascending aorta: The ascending aorta is dilated. - Mitral valve: Mildly thickened leaflets . There was mild  regurgitation. - Left atrium: Severely dilated at 57 ml/m2. - Right ventricle: The cavity size was mildly dilated. - Right atrium: Severely dilated at 27 cm2. - Tricuspid valve: There was mild regurgitation. - Pulmonary arteries: PA peak pressure: 35 mm Hg (S). - Inferior vena cava: The vessel was dilated. The respirophasic  diameter changes were blunted (< 50%), consistent with elevated  central venous pressure.   The basic rhythm is atrial fibrillation  There are aberrated beats or PVC's and a short run of wide-complex tachycardia, suspect AF with aberrancy  There are episodes of slow atrial fibrillation   ASSESSMENT AND PLAN:  1.  Atrial fibrillation: He has a severely dilated LA and thus may have been in AF for quite a while.  He is on Eliquis.  I have discussed with him options of treatment for atrial fibrillation. This is unfortunately difficult as he is bradycardic at baseline. I have told him that he might be moving towards a pacemaker placement. I told him of the risks and benefits of pacemaker placement. Risks include bleeding, infection, tamponade, pneumothorax. He would like to avoid pacemaker placement  at this time. I Derrick [name] therefore start him on multi-400 twice a day with the EKG in 1 week to see how he tolerates the medication. Should he not convert, he may be a candidate for cardioversion in the future. We also discussed the possibility of ablation in the future which she would be open to.  This patients CHA2DS2-VASc Score and unadjusted Ischemic Stroke Rate (% per year) is equal to 3.2 % stroke rate/year from a score of 3  Above score calculated as 1  point each if present [CHF, HTN, DM, Vascular=MI/PAD/Aortic Plaque, Age if 65-74, or Male] Above score calculated as 2 points each if present [Age > 75, or Stroke/TIA/TE]  2. Hypertension: well controlled, no changes made  3. Hyperlipidemia: Continue statin     Current medicines are reviewed at length with the patient today.   The patient does not have concerns regarding his medicines.  The following changes were made today:  multaq  Labs/ tests ordered today include:  No orders of the defined types were placed in this encounter.     Disposition:   FU with Mayu Ronk 3 months  Signed, Rollo Farquhar [name], MD  04/27/2016 8:01 AM     [facility] [address] [place] [address] [phone] (office) [phone] (fax)

## 2016-04-27 ENCOUNTER — Encounter: Payer: Self-pay | Admitting: Cardiology

## 2016-04-27 ENCOUNTER — Ambulatory Visit (INDEPENDENT_AMBULATORY_CARE_PROVIDER_SITE_OTHER): Payer: Medicare Other | Admitting: Cardiology

## 2016-04-27 VITALS — BP 132/74 | HR 50 | Ht 70.0 in | Wt 196.8 lb

## 2016-04-27 DIAGNOSIS — I4891 Unspecified atrial fibrillation: Secondary | ICD-10-CM

## 2016-04-27 MED ORDER — DRONEDARONE HCL 400 MG PO TABS: 400.0000 mg | ORAL_TABLET | Freq: Two times a day (BID) | ORAL | Status: DC

## 2016-04-27 NOTE — Patient Instructions (Addendum)
Medication Instructions:  Your physician has recommended you make the following change in your medication:  1) START Multaq 400 mg twice daily  Labwork: None ordered  Testing/Procedures: None ordered  Follow-Up: Your physician recommends that you schedule a follow-up nurse visit appointment in: 1 week for EKG  Your physician recommends that you schedule a follow-up appointment in: 3 months with Dr. Curt Bears.   If you need a refill on your cardiac medications before your next appointment, please call your pharmacy.  Thank you for choosing [facility]!!   Trinidad Curet, RN [phone]   Any Other Special Instructions Will Be Listed Below (If Applicable). Dronedarone tablets What is this medicine? DRONEDARONE (droe NE da rone) is an antiarrhythmic drug. It helps make your heart beat regularly. This medicine may be used for other purposes; ask your health care provider or pharmacist if you have questions. What should I tell my health care provider before I take this medicine? They need to know if you have any of these conditions: -heart failure -history of irregular heartbeat -liver disease -liver or lung problems with the past use of amiodarone -low levels of magnesium in the blood -low levels of potassium in the blood -other heart disease -an unusual or allergic reaction to dronedarone, other medicines, foods, dyes, or preservatives -pregnant or trying to get pregnant -breast-feeding How should I use this medicine? Take this medicine by mouth with a glass of water. Follow the directions on the prescription label. Take one tablet with the morning meal and one tablet with the evening meal. Do not take your medicine more often than directed. Do not stop taking except on the advice of your doctor or health care professional. A special MedGuide will be given to you by the pharmacist with each prescription and refill. Be sure to read this information carefully each time. Talk  to your pediatrician regarding the use of this medicine in children. Special care may be needed. Overdosage: If you think you have taken too much of this medicine contact a poison control center or emergency room at once. NOTE: This medicine is only for you. Do not share this medicine with others. What if I miss a dose? If you miss a dose, take it as soon as you can. If it is almost time for your next dose, take only that dose. Do not take double or extra doses. What may interact with this medicine? Do not take this medicine with any of the following medications: -arsenic trioxide -certain antibiotics like clarithromycin, erythromycin, pentamidine, telithromycin, troleandomycin -certain medicines for depression like tricyclic antidepressants -certain medicines for fungal infections like fluconazole, itraconazole, ketoconazole, posaconazole, voriconazole -certain medicines for irregular heart beat like amiodarone, disopyramide, dofetilide, flecainide, ibutilide, quinidine, propafenone, sotalol -certain medicines for malaria like chloroquine, halofantrine -cisapride -cyclosporine -droperidol -haloperidol -methadone -other medicines that prolong the QT interval (cause an abnormal heart rhythm) -pimozide -nefazodone -phenothiazines like chlorpromazine, mesoridazine, prochlorperazine, thioridazine -ritonavir -ziprasidone This medicine may also interact with the following medications: -certain medicines for blood pressure, heart disease, or irregular heart beat like diltiazem, metoprolol, propranolol, verapamil -certain medicines for cholesterol like atorvastatin, lovastatin, simvastatin -certain medicines for seizures like carbamazepine, phenobarbital, phenytoin -digoxin -grapefruit juice -rifampin -sirolimus -St. John's Wort -tacrolimus This list may not describe all possible interactions. Give your health care provider a list of all the medicines, herbs, non-prescription drugs, or  dietary supplements you use. Also tell them if you smoke, drink alcohol, or use illegal drugs. Some items may interact with your medicine. What  should I watch for while using this medicine? Your condition will be monitored closely when you first begin therapy. Often, this drug is first started in a hospital or other monitored health care setting. Once you are on maintenance therapy, visit your doctor or health care professional for regular checks on your progress. Because your condition and use of this medicine carry some risk, it is a good idea to carry an identification card, necklace or bracelet with details of your condition, medications, and doctor or health care professional. Dennis Bast may get drowsy or dizzy. Do not drive, use machinery, or do anything that needs mental alertness until you know how this medicine affects you. Do not stand or sit up quickly, especially if you are an older patient. This reduces the risk of dizzy or fainting spells. What side effects may I notice from receiving this medicine? Side effects that you should report to your doctor or health care professional as soon as possible: -allergic reactions like skin rash, itching or hives, swelling of the face, lips, or tongue -breathing problems -cough -dark urine -fast, irregular heartbeat -general ill feeling or flu-like symptoms -light-colored stools -loss of appetite, nausea -right upper belly pain -slow heartbeat -stomach pain -swelling of the legs or ankles -unusually weak or tired -weight gain -yellowing of the eyes or skin Side effects that usually do not require medical attention (Report these to your doctor or health care professional if they continue or are bothersome.): -nausea -vomiting -stomach pain This list may not describe all possible side effects. Call your doctor for medical advice about side effects. You may report side effects to FDA at 1-800-FDA-1088. Where should I keep my medicine? Keep out of  the reach of children. Store at room temperature between 15 and 30 degrees C (59 and 86 degrees F). Throw away any unused medicine after the expiration date. NOTE: This sheet is a summary. It may not cover all possible information. If you have questions about this medicine, talk to your doctor, pharmacist, or health care provider.    2016, Elsevier/Gold Standard. (2013-06-11 16:25:05)

## 2016-04-27 NOTE — Addendum Note (Signed)
Addended by: Stanton Kidney on: 04/27/2016 08:40 AM   Modules accepted: Orders

## 2016-05-03 ENCOUNTER — Encounter: Payer: Self-pay | Admitting: *Deleted

## 2016-05-03 ENCOUNTER — Encounter: Payer: Self-pay | Admitting: Internal Medicine

## 2016-05-03 ENCOUNTER — Other Ambulatory Visit: Payer: Self-pay | Admitting: Cardiology

## 2016-05-03 ENCOUNTER — Ambulatory Visit (INDEPENDENT_AMBULATORY_CARE_PROVIDER_SITE_OTHER): Payer: Medicare Other | Admitting: *Deleted

## 2016-05-03 VITALS — BP 122/70 | HR 47 | Wt 200.0 lb

## 2016-05-03 DIAGNOSIS — I4891 Unspecified atrial fibrillation: Secondary | ICD-10-CM | POA: Diagnosis not present

## 2016-05-03 LAB — CBC WITH DIFFERENTIAL/PLATELET
Basophils Absolute: 63 cells/uL (ref 0–200)
Basophils Relative: 1 %
Eosinophils Absolute: 252 cells/uL (ref 15–500)
Eosinophils Relative: 4 %
HCT: 38.2 % — ABNORMAL LOW (ref 38.5–50.0)
Hemoglobin: 13.1 g/dL — ABNORMAL LOW (ref 13.2–17.1)
Lymphocytes Relative: 15 %
Lymphs Abs: 945 cells/uL (ref 850–3900)
MCH: 31.3 pg (ref 27.0–33.0)
MCHC: 34.3 g/dL (ref 32.0–36.0)
MCV: 91.4 fL (ref 80.0–100.0)
MPV: 10.1 fL (ref 7.5–12.5)
Monocytes Absolute: 567 cells/uL (ref 200–950)
Monocytes Relative: 9 %
Neutro Abs: 4473 cells/uL (ref 1500–7800)
Neutrophils Relative %: 71 %
Platelets: 189 10*3/uL (ref 140–400)
RBC: 4.18 MIL/uL — ABNORMAL LOW (ref 4.20–5.80)
RDW: 13.6 % (ref 11.0–15.0)
WBC: 6.3 10*3/uL (ref 3.8–10.8)

## 2016-05-03 LAB — BASIC METABOLIC PANEL
BUN: 16 mg/dL (ref 7–25)
CO2: 28 mmol/L (ref 20–31)
Calcium: 9.6 mg/dL (ref 8.6–10.3)
Chloride: 100 mmol/L (ref 98–110)
Creat: 0.93 mg/dL (ref 0.70–1.18)
Glucose, Bld: 143 mg/dL — ABNORMAL HIGH (ref 65–99)
Potassium: 3.6 mmol/L (ref 3.5–5.3)
Sodium: 139 mmol/L (ref 135–146)

## 2016-05-03 NOTE — Patient Instructions (Signed)
Medication Instructions: - Your physician recommends that you continue on your current medications as directed. Please refer to the Current Medication list given to you today.  Labwork: - Your physician recommends that you have lab work today: CBC/ BMP  Procedures/Testing: - Your physician has recommended that you have a Cardioversion (DCCV). Electrical Cardioversion uses a jolt of electricity to your heart either through paddles or wired patches attached to your chest. This is a controlled, usually prescheduled, procedure. Defibrillation is done under light anesthesia in the hospital, and you usually go home the day of the procedure. This is done to get your heart back into a normal rhythm. You are not awake for the procedure. Please see the instruction sheet given to you today.  Follow-Up: - pending discharge from Cardioversion  Any Additional Special Instructions Will Be Listed Below (If Applicable).     If you need a refill on your cardiac medications before your next appointment, please call your pharmacy.

## 2016-05-03 NOTE — Progress Notes (Signed)
1.) Reason for visit: EKG  2.) Name of MD requesting visit: Camnitz  3.) H&P: 1 week follow up EKG for initiation of Multaq 400 mg BID on 04/27/16.  4.) ROS related to problem: Patient complains of tiredness/ fatigue over the last week. He reports HR's in the 40-50 range, but his blood pressure has been ok. His O2 levels have been running 92-94% on room air. He does use C-PAP at night.   5.) Assessment and plan per MD: EKG reviewed with Dr. Curt Bears- per MD, the patient is still in a-fib. There has been no change in his rhythm since last week. Per Dr. Curt Bears- arrange for Cardioversion with him next week. I have reviewed this plan with the patient and he is agreeable. He is scheduled for DCCV on 05/12/16 at 2:00 pm with Dr. Curt Bears. Letter of instructions given/ discussed with the patient and his wife. CBC/ BMP collected today.

## 2016-05-11 DIAGNOSIS — Z0000 Encounter for general adult medical examination without abnormal findings: Secondary | ICD-10-CM | POA: Diagnosis not present

## 2016-05-11 DIAGNOSIS — R310 Gross hematuria: Secondary | ICD-10-CM | POA: Diagnosis not present

## 2016-05-12 ENCOUNTER — Encounter (HOSPITAL_COMMUNITY)
Admission: RE | Disposition: A | Discharge status: Home / Self Care | Payer: Self-pay | Source: Ambulatory Visit | Attending: Cardiology

## 2016-05-12 ENCOUNTER — Ambulatory Visit (HOSPITAL_COMMUNITY): Payer: Medicare Other | Admitting: Certified Registered Nurse Anesthetist

## 2016-05-12 ENCOUNTER — Encounter (HOSPITAL_COMMUNITY): Payer: Self-pay

## 2016-05-12 ENCOUNTER — Ambulatory Visit (HOSPITAL_COMMUNITY)
Admission: RE | Admit: 2016-05-12 | Discharge: 2016-05-13 | Disposition: A | Discharge status: Home / Self Care | Payer: Medicare Other | Source: Ambulatory Visit | Attending: Cardiology | Admitting: Cardiology

## 2016-05-12 DIAGNOSIS — J449 Chronic obstructive pulmonary disease, unspecified: Secondary | ICD-10-CM | POA: Insufficient documentation

## 2016-05-12 DIAGNOSIS — I10 Essential (primary) hypertension: Secondary | ICD-10-CM | POA: Insufficient documentation

## 2016-05-12 DIAGNOSIS — I4891 Unspecified atrial fibrillation: Secondary | ICD-10-CM | POA: Diagnosis not present

## 2016-05-12 DIAGNOSIS — E785 Hyperlipidemia, unspecified: Secondary | ICD-10-CM | POA: Diagnosis not present

## 2016-05-12 DIAGNOSIS — Z7951 Long term (current) use of inhaled steroids: Secondary | ICD-10-CM | POA: Insufficient documentation

## 2016-05-12 DIAGNOSIS — D860 Sarcoidosis of lung: Secondary | ICD-10-CM | POA: Insufficient documentation

## 2016-05-12 DIAGNOSIS — R001 Bradycardia, unspecified: Secondary | ICD-10-CM | POA: Diagnosis not present

## 2016-05-12 DIAGNOSIS — Z79899 Other long term (current) drug therapy: Secondary | ICD-10-CM | POA: Diagnosis not present

## 2016-05-12 DIAGNOSIS — I2510 Atherosclerotic heart disease of native coronary artery without angina pectoris: Secondary | ICD-10-CM | POA: Insufficient documentation

## 2016-05-12 DIAGNOSIS — Z85038 Personal history of other malignant neoplasm of large intestine: Secondary | ICD-10-CM | POA: Diagnosis not present

## 2016-05-12 DIAGNOSIS — Z7901 Long term (current) use of anticoagulants: Secondary | ICD-10-CM | POA: Insufficient documentation

## 2016-05-12 DIAGNOSIS — I252 Old myocardial infarction: Secondary | ICD-10-CM | POA: Diagnosis not present

## 2016-05-12 DIAGNOSIS — Z955 Presence of coronary angioplasty implant and graft: Secondary | ICD-10-CM | POA: Insufficient documentation

## 2016-05-12 DIAGNOSIS — G4733 Obstructive sleep apnea (adult) (pediatric): Secondary | ICD-10-CM | POA: Insufficient documentation

## 2016-05-12 DIAGNOSIS — E559 Vitamin D deficiency, unspecified: Secondary | ICD-10-CM | POA: Diagnosis not present

## 2016-05-12 DIAGNOSIS — I495 Sick sinus syndrome: Secondary | ICD-10-CM | POA: Diagnosis not present

## 2016-05-12 DIAGNOSIS — G6181 Chronic inflammatory demyelinating polyneuritis: Secondary | ICD-10-CM | POA: Diagnosis not present

## 2016-05-12 DIAGNOSIS — I481 Persistent atrial fibrillation: Secondary | ICD-10-CM | POA: Diagnosis not present

## 2016-05-12 DIAGNOSIS — D472 Monoclonal gammopathy: Secondary | ICD-10-CM | POA: Diagnosis not present

## 2016-05-12 DIAGNOSIS — K219 Gastro-esophageal reflux disease without esophagitis: Secondary | ICD-10-CM | POA: Insufficient documentation

## 2016-05-12 DIAGNOSIS — I480 Paroxysmal atrial fibrillation: Secondary | ICD-10-CM | POA: Diagnosis present

## 2016-05-12 DIAGNOSIS — Z95818 Presence of other cardiac implants and grafts: Secondary | ICD-10-CM

## 2016-05-12 HISTORY — DX: Other complications of anesthesia, initial encounter: T88.59XA

## 2016-05-12 HISTORY — DX: Adverse effect of unspecified anesthetic, initial encounter: T41.45XA

## 2016-05-12 HISTORY — DX: Monoclonal gammopathy: D47.2

## 2016-05-12 HISTORY — DX: Presence of cardiac pacemaker: Z95.0

## 2016-05-12 HISTORY — DX: Dependence on other enabling machines and devices: Z99.89

## 2016-05-12 HISTORY — DX: Unspecified open-angle glaucoma, stage unspecified: H40.10X0

## 2016-05-12 HISTORY — DX: Polyneuropathy, unspecified: G62.9

## 2016-05-12 HISTORY — DX: Cardiac murmur, unspecified: R01.1

## 2016-05-12 HISTORY — DX: Obstructive sleep apnea (adult) (pediatric): G47.33

## 2016-05-12 HISTORY — PX: EP IMPLANTABLE DEVICE: SHX172B

## 2016-05-12 HISTORY — PX: INSERT / REPLACE / REMOVE PACEMAKER: SUR710

## 2016-05-12 HISTORY — DX: Personal history of other diseases of the musculoskeletal system and connective tissue: Z87.39

## 2016-05-12 HISTORY — DX: Other chronic pain: G89.29

## 2016-05-12 HISTORY — PX: ELECTROPHYSIOLOGIC STUDY: SHX172A

## 2016-05-12 HISTORY — DX: Non-ST elevation (NSTEMI) myocardial infarction: I21.4

## 2016-05-12 HISTORY — DX: Dorsalgia, unspecified: M54.9

## 2016-05-12 LAB — POCT I-STAT, CHEM 8
BUN: 15 mg/dL (ref 6–20)
Calcium, Ion: 1.19 mmol/L (ref 1.13–1.30)
Chloride: 103 mmol/L (ref 101–111)
Creatinine, Ser: 1 mg/dL (ref 0.61–1.24)
Glucose, Bld: 86 mg/dL (ref 65–99)
HCT: 39 % (ref 39.0–52.0)
Hemoglobin: 13.3 g/dL (ref 13.0–17.0)
Potassium: 3.4 mmol/L — ABNORMAL LOW (ref 3.5–5.1)
Sodium: 140 mmol/L (ref 135–145)
TCO2: 25 mmol/L (ref 0–100)

## 2016-05-12 SURGERY — CARDIOVERSION (CATH LAB)

## 2016-05-12 SURGERY — CANCELLED PROCEDURE

## 2016-05-12 MED ORDER — TRIAMTERENE-HCTZ 37.5-25 MG PO TABS
1.0000 | ORAL_TABLET | Freq: Every day | ORAL | Status: DC
  Administered 2016-05-13: 09:00:00 1 via ORAL
  Filled 2016-05-12: qty 1

## 2016-05-12 MED ORDER — CEFAZOLIN SODIUM-DEXTROSE 2-4 GM/100ML-% IV SOLN
2.0000 g | INTRAVENOUS | Status: DC
  Administered 2016-05-12: 2 g via INTRAVENOUS

## 2016-05-12 MED ORDER — PANTOPRAZOLE SODIUM 40 MG PO TBEC
40.0000 mg | DELAYED_RELEASE_TABLET | Freq: Every day | ORAL | Status: DC
  Administered 2016-05-13: 40 mg via ORAL
  Filled 2016-05-12: qty 1

## 2016-05-12 MED ORDER — MIDAZOLAM HCL 5 MG/5ML IJ SOLN
INTRAMUSCULAR | Status: AC
  Filled 2016-05-12: qty 5

## 2016-05-12 MED ORDER — CEFAZOLIN SODIUM-DEXTROSE 2-4 GM/100ML-% IV SOLN
INTRAVENOUS | Status: AC
  Filled 2016-05-12: qty 100

## 2016-05-12 MED ORDER — YOU HAVE A PACEMAKER BOOK
Freq: Once | Status: AC
  Administered 2016-05-12: 21:00:00
  Filled 2016-05-12: qty 1

## 2016-05-12 MED ORDER — FLUTICASONE PROPIONATE 50 MCG/ACT NA SUSP
2.0000 | Freq: Two times a day (BID) | NASAL | Status: DC
  Administered 2016-05-12 – 2016-05-13 (×2): 2 via NASAL
  Filled 2016-05-12: qty 16

## 2016-05-12 MED ORDER — DRONEDARONE HCL 400 MG PO TABS
400.0000 mg | ORAL_TABLET | Freq: Two times a day (BID) | ORAL | Status: DC
  Administered 2016-05-13: 400 mg via ORAL
  Filled 2016-05-12: qty 1

## 2016-05-12 MED ORDER — CHLORHEXIDINE GLUCONATE 4 % EX LIQD: 60.0000 mL | Freq: Once | CUTANEOUS | Status: DC

## 2016-05-12 MED ORDER — ACETAMINOPHEN 325 MG PO TABS
325.0000 mg | ORAL_TABLET | ORAL | Status: DC | PRN
  Administered 2016-05-13: 01:00:00 650 mg via ORAL
  Filled 2016-05-12: qty 2

## 2016-05-12 MED ORDER — SODIUM CHLORIDE 0.9 % IR SOLN
Status: AC
  Filled 2016-05-12: qty 2

## 2016-05-12 MED ORDER — BUDESONIDE 0.5 MG/2ML IN SUSP
0.5000 mg | Freq: Two times a day (BID) | RESPIRATORY_TRACT | Status: DC
  Filled 2016-05-12: qty 2

## 2016-05-12 MED ORDER — GABAPENTIN 400 MG PO CAPS
800.0000 mg | ORAL_CAPSULE | Freq: Three times a day (TID) | ORAL | Status: DC
  Administered 2016-05-12 – 2016-05-13 (×2): 800 mg via ORAL
  Filled 2016-05-12 (×2): qty 2

## 2016-05-12 MED ORDER — LIDOCAINE HCL (PF) 1 % IJ SOLN
INTRAMUSCULAR | Status: AC
  Filled 2016-05-12: qty 60

## 2016-05-12 MED ORDER — SODIUM CHLORIDE 0.9 % IV SOLN
INTRAVENOUS | Status: DC
  Administered 2016-05-12: 14:00:00 via INTRAVENOUS

## 2016-05-12 MED ORDER — AMLODIPINE BESYLATE 10 MG PO TABS
10.0000 mg | ORAL_TABLET | Freq: Every day | ORAL | Status: DC
  Administered 2016-05-13: 10 mg via ORAL
  Filled 2016-05-12: qty 1

## 2016-05-12 MED ORDER — SODIUM CHLORIDE 0.9 % IR SOLN
80.0000 mg | Status: DC
  Administered 2016-05-12: 80 mg

## 2016-05-12 MED ORDER — HEPARIN (PORCINE) IN NACL 2-0.9 UNIT/ML-% IJ SOLN
INTRAMUSCULAR | Status: DC | PRN
  Administered 2016-05-12: 17:00:00

## 2016-05-12 MED ORDER — ALBUTEROL SULFATE (2.5 MG/3ML) 0.083% IN NEBU: 2.5000 mg | INHALATION_SOLUTION | Freq: Four times a day (QID) | RESPIRATORY_TRACT | Status: DC | PRN

## 2016-05-12 MED ORDER — LIDOCAINE HCL (PF) 1 % IJ SOLN
INTRAMUSCULAR | Status: DC | PRN
  Administered 2016-05-12: 52 mL via SUBCUTANEOUS

## 2016-05-12 MED ORDER — MIDAZOLAM HCL 5 MG/5ML IJ SOLN
INTRAMUSCULAR | Status: DC | PRN
  Administered 2016-05-12 (×5): 1 mg via INTRAVENOUS

## 2016-05-12 MED ORDER — FENTANYL CITRATE (PF) 100 MCG/2ML IJ SOLN
INTRAMUSCULAR | Status: DC | PRN
  Administered 2016-05-12 (×2): 25 ug via INTRAVENOUS

## 2016-05-12 MED ORDER — ONDANSETRON HCL 4 MG/2ML IJ SOLN: 4.0000 mg | Freq: Four times a day (QID) | INTRAMUSCULAR | Status: DC | PRN

## 2016-05-12 MED ORDER — FENTANYL CITRATE (PF) 100 MCG/2ML IJ SOLN
INTRAMUSCULAR | Status: AC
  Filled 2016-05-12: qty 2

## 2016-05-12 MED ORDER — HEPARIN (PORCINE) IN NACL 2-0.9 UNIT/ML-% IJ SOLN
INTRAMUSCULAR | Status: AC
  Filled 2016-05-12: qty 500

## 2016-05-12 MED ORDER — GABAPENTIN 800 MG PO TABS
800.0000 mg | ORAL_TABLET | Freq: Three times a day (TID) | ORAL | Status: DC
  Filled 2016-05-12: qty 1

## 2016-05-12 MED ORDER — LORATADINE 10 MG PO TABS
10.0000 mg | ORAL_TABLET | Freq: Every day | ORAL | Status: DC
  Administered 2016-05-13: 09:00:00 10 mg via ORAL
  Filled 2016-05-12: qty 1

## 2016-05-12 MED ORDER — FINASTERIDE 5 MG PO TABS
5.0000 mg | ORAL_TABLET | Freq: Every day | ORAL | Status: DC
  Administered 2016-05-13: 5 mg via ORAL
  Filled 2016-05-12: qty 1

## 2016-05-12 MED ORDER — APIXABAN 5 MG PO TABS
5.0000 mg | ORAL_TABLET | Freq: Two times a day (BID) | ORAL | Status: DC
  Administered 2016-05-12 – 2016-05-13 (×2): 5 mg via ORAL
  Filled 2016-05-12 (×2): qty 1

## 2016-05-12 MED ORDER — CEFAZOLIN SODIUM 1-5 GM-% IV SOLN
1.0000 g | Freq: Four times a day (QID) | INTRAVENOUS | Status: AC
  Administered 2016-05-12 – 2016-05-13 (×3): 1 g via INTRAVENOUS
  Filled 2016-05-12 (×4): qty 50

## 2016-05-12 MED ORDER — DORZOLAMIDE HCL-TIMOLOL MAL 2-0.5 % OP SOLN
1.0000 [drp] | Freq: Two times a day (BID) | OPHTHALMIC | Status: DC
  Administered 2016-05-12 – 2016-05-13 (×2): 1 [drp] via OPHTHALMIC
  Filled 2016-05-12: qty 10

## 2016-05-12 MED ORDER — ADULT MULTIVITAMIN W/MINERALS CH
1.0000 | ORAL_TABLET | Freq: Every day | ORAL | Status: DC
  Administered 2016-05-13: 1 via ORAL
  Filled 2016-05-12 (×2): qty 1

## 2016-05-12 MED ORDER — ATORVASTATIN CALCIUM 20 MG PO TABS
20.0000 mg | ORAL_TABLET | Freq: Every day | ORAL | Status: DC
  Filled 2016-05-12: qty 1

## 2016-05-12 MED ORDER — TADALAFIL 5 MG PO TABS: 5.0000 mg | ORAL_TABLET | Freq: Every day | ORAL | Status: DC

## 2016-05-12 SURGICAL SUPPLY — 7 items
CABLE SURGICAL S-101-97-12 (CABLE) ×2 IMPLANT
LEAD CAPSURE NOVUS 5076-52CM (Lead) ×2 IMPLANT
LEAD CAPSURE NOVUS 5076-58CM (Lead) ×2 IMPLANT
PAD DEFIB LIFELINK (PAD) ×2 IMPLANT
PPM ADVISA MRI DR A2DR01 (Pacemaker) ×2 IMPLANT
SHEATH CLASSIC 7F (SHEATH) ×8 IMPLANT
TRAY PACEMAKER INSERTION (PACKS) ×2 IMPLANT

## 2016-05-12 NOTE — H&P (Signed)
Derrick [name] presented today for cardioversion.  Patient with atrial fibrillation.  Presented today feeling quite a bit more fatigued and HR today at 43.  Discussed plan for pacemaker.  Discussed risks and benefits of pacemaker placement.  Risks include bleeding, infection, tamponade, heart block.  Patient understands the risks and has agreed to the procedure.  Audreena Sachdeva plan for cardioversion as well at that time.    Obie Silos 05/12/2016 2:18 PM

## 2016-05-13 ENCOUNTER — Encounter (HOSPITAL_COMMUNITY): Payer: Self-pay | Admitting: Cardiology

## 2016-05-13 ENCOUNTER — Telehealth: Payer: Self-pay | Admitting: *Deleted

## 2016-05-13 ENCOUNTER — Ambulatory Visit (HOSPITAL_COMMUNITY): Payer: Medicare Other

## 2016-05-13 DIAGNOSIS — I4891 Unspecified atrial fibrillation: Secondary | ICD-10-CM | POA: Diagnosis not present

## 2016-05-13 DIAGNOSIS — G6181 Chronic inflammatory demyelinating polyneuritis: Secondary | ICD-10-CM | POA: Diagnosis not present

## 2016-05-13 DIAGNOSIS — I481 Persistent atrial fibrillation: Secondary | ICD-10-CM | POA: Diagnosis not present

## 2016-05-13 DIAGNOSIS — I495 Sick sinus syndrome: Secondary | ICD-10-CM | POA: Diagnosis not present

## 2016-05-13 DIAGNOSIS — D472 Monoclonal gammopathy: Secondary | ICD-10-CM | POA: Diagnosis not present

## 2016-05-13 DIAGNOSIS — R001 Bradycardia, unspecified: Secondary | ICD-10-CM | POA: Diagnosis not present

## 2016-05-13 DIAGNOSIS — I2510 Atherosclerotic heart disease of native coronary artery without angina pectoris: Secondary | ICD-10-CM | POA: Diagnosis not present

## 2016-05-13 DIAGNOSIS — Z950 Presence of cardiac pacemaker: Secondary | ICD-10-CM | POA: Diagnosis not present

## 2016-05-13 DIAGNOSIS — J9811 Atelectasis: Secondary | ICD-10-CM | POA: Diagnosis not present

## 2016-05-13 DIAGNOSIS — D860 Sarcoidosis of lung: Secondary | ICD-10-CM | POA: Diagnosis not present

## 2016-05-13 MED ORDER — DRONEDARONE HCL 400 MG PO TABS: 400.0000 mg | ORAL_TABLET | Freq: Two times a day (BID) | ORAL | Status: DC

## 2016-05-13 NOTE — Telephone Encounter (Signed)
Pt was on TCM list admitted for Systomatic bradycardia s/p pacemaker & persistant A-fib status post cardioversion. Pt will be f/u w/cardiology 05/26/16...Johny Chess

## 2016-05-13 NOTE — Discharge Instructions (Addendum)
Supplemental Discharge Instructions for  Pacemaker/Defibrillator Patients  Activity No heavy lifting or vigorous activity with your left/right arm for 6 to 8 weeks.  Do not raise your left/right arm above your head for one week.  Gradually raise your affected arm as drawn below.           __          05/17/16                        05/18/16                 05/19/16                  05/20/16  NO DRIVING for  1 week   ; you may begin driving on   [id number]  .  WOUND CARE - Keep the wound area clean and dry.  Do not get this area wet for one week. No showers for one week; you may shower on   05/20/16  . - The tape/steri-strips on your wound will fall off; do not pull them off.  No bandage is needed on the site.  DO  NOT apply any creams, oils, or ointments to the wound area. - If you notice any drainage or discharge from the wound, any swelling or bruising at the site, or you develop a fever > 101? F after you are discharged home, call the office at once.  Special Instructions - You are still able to use cellular telephones; use the ear opposite the side where you have your pacemaker/defibrillator.  Avoid carrying your cellular phone near your device. - When traveling through airports, show security personnel your identification card to avoid being screened in the metal detectors.  Ask the security personnel to use the hand wand. - Avoid arc welding equipment, MRI testing (magnetic resonance imaging), TENS units (transcutaneous nerve stimulators).  Call the office for questions about other devices. - Avoid electrical appliances that are in poor condition or are not properly grounded. - Microwave ovens are safe to be near or to operate.   Information on my medicine - ELIQUIS (apixaban)  This medication education was reviewed with me or my healthcare representative as part of my discharge preparation.   Why was Eliquis prescribed for you? Eliquis was prescribed for you to reduce the risk of  a blood clot forming that can cause a stroke if you have a medical condition called atrial fibrillation (a type of irregular heartbeat).  What do You need to know about Eliquis ? Take your Eliquis TWICE DAILY - one tablet in the morning and one tablet in the evening with or without food. If you have difficulty swallowing the tablet whole please discuss with your pharmacist how to take the medication safely.  Take Eliquis exactly as prescribed by your doctor and DO NOT stop taking Eliquis without talking to the doctor who prescribed the medication.  Stopping may increase your risk of developing a stroke.  Refill your prescription before you run out.  After discharge, you should have regular check-up appointments with your healthcare provider that is prescribing your Eliquis.  In the future your dose may need to be changed if your kidney function or weight changes by a significant amount or as you get older.  What do you do if you miss a dose? If you miss a dose, take it as soon as you remember on the same day and resume taking  twice daily.  Do not take more than one dose of ELIQUIS at the same time to make up a missed dose.  Important Safety Information A possible side effect of Eliquis is bleeding. You should call your healthcare provider right away if you experience any of the following: ? Bleeding from an injury or your nose that does not stop. ? Unusual colored urine (red or dark brown) or unusual colored stools (red or black). ? Unusual bruising for unknown reasons. ? A serious fall or if you hit your head (even if there is no bleeding).  Some medicines may interact with Eliquis and might increase your risk of bleeding or clotting while on Eliquis. To help avoid this, consult your healthcare provider or pharmacist prior to using any new prescription or non-prescription medications, including herbals, vitamins, non-steroidal anti-inflammatory drugs (NSAIDs) and supplements.  This  website has more information on Eliquis (apixaban): http://www.eliquis.com/eliquis/home

## 2016-05-13 NOTE — Care Management Note (Signed)
Case Management Note  Patient Details  Name: Vannie Chapla MRN: [id number] Date of Birth: [birth year]  Subjective/Objective:   Patient lives with  Spouse, pta indep, he is already on eliquis, no needs.                 Action/Plan:   Expected Discharge Date:                  Expected Discharge Plan:  Home/Self Care  In-House Referral:     Discharge planning Services  CM Consult  Post Acute Care Choice:    Choice offered to:     DME Arranged:    DME Agency:     HH Arranged:    [place] Agency:     Status of Service:  Completed, signed off  Medicare Important Message Given:    Date Medicare IM Given:    Medicare IM give by:    Date Additional Medicare IM Given:    Additional Medicare Important Message give by:     If discussed at [place] of Stay Meetings, dates discussed:    Additional Comments:  Zenon Mayo, RN 05/13/2016, 9:02 AM

## 2016-05-13 NOTE — Discharge Summary (Signed)
ELECTROPHYSIOLOGY PROCEDURE DISCHARGE SUMMARY    Patient ID: Derrick [name],  MRN: [id number], DOB/AGE: January 21, 1941 75 y.o.  Admit date: 05/12/2016 Discharge date: 05/13/2016  Primary Care Physician: [name], MD Primary Cardiologist: Burt Knack Electrophysiologist: Curt Bears  Primary Discharge Diagnosis:  Symptomatic bradycardia status post pacemaker implantation this admission Persistent atrial fibrillation status post cardioversion this admission  Secondary Discharge Diagnosis:  1.  CIDP 2.  CAD 3.  Pulmonary sarcoid 4.  Monoclonal gammopathy  No Known Allergies   Procedures This Admission:  1.  Implantation of a MDT dual chamber PPM on 05/12/16 by Dr Curt Bears.  The patient received a MDT model number Advisa PPM with model number 5076 right atrial lead and 5076 right ventricular lead. There were no immediate post procedure complications. 2.  Cardioversion on 05/12/16 by Dr Curt Bears 2.  CXR on 05/13/16 demonstrated no pneumothorax status post device implantation.   Brief HPI/Hospital Course:  Derrick [name] is a 75 y.o. male with a past medical history as outlined above.  He was referred to [facility] for evaluation of treatment options for atrial fibrillation.  He was started on Multaq with plans for cardioversion. On arrival for cardioversion, he was significantly bradycardic.  Treatment options were discussed with the patient including pacemaker implantation prior to cardioversion.  Risks, benefits, and alternatives to PPM implantation were reviewed with the patient who wished to proceed.   The patient was admitted and underwent implantation of a MDT dual chamber pacemaker and cardioversion with details as outlined above.  He  was monitored on telemetry overnight which demonstrated AV pacing.  Left chest was without hematoma or ecchymosis.  The device was interrogated and found to be functioning normally.  CXR was obtained and demonstrated no pneumothorax status post device implantation.   Wound care, arm mobility, and restrictions were reviewed with the patient.  The patient was examined and considered stable for discharge to home.    Physical Exam: Filed Vitals:   05/12/16 1638 05/12/16 1815 05/12/16 2000 05/13/16 0621  BP:  128/71 122/64 127/61  Pulse:  68 62 66  Temp:  98.1 F (36.7 C) 97.5 F (36.4 C) 97.6 F (36.4 C)  TempSrc:  Oral Oral Oral  Resp:  21 20 17   Height:      Weight:    196 lb 3.4 oz (89 kg)  SpO2: 95% 94% 92% 94%    GEN- The patient is well appearing, alert and oriented x 3 today.   HEENT: normocephalic, atraumatic; sclera clear, conjunctiva pink; hearing intact; oropharynx clear; neck supple  Lungs- Clear to ausculation bilaterally, normal work of breathing.  No wheezes, rales, rhonchi Heart- Regular rate and rhythm  GI- soft, non-tender, non-distended, bowel sounds present  Extremities- no clubbing, cyanosis, or edema; DP/PT/radial pulses 2+ bilaterally MS- no significant deformity or atrophy Skin- warm and dry, no rash or lesion, left chest without hematoma/ecchymosis Psych- euthymic mood, full affect Neuro- strength and sensation are intact   Labs:   Lab Results  Component Value Date   WBC 6.3 05/03/2016   HGB 13.3 05/12/2016   HCT 39.0 05/12/2016   MCV 91.4 05/03/2016   PLT 189 05/03/2016     Recent Labs Lab 05/12/16 1314  NA 140  K 3.4*  CL 103  BUN 15  CREATININE 1.00  GLUCOSE 86    Discharge Medications:    Medication List    TAKE these medications        albuterol 108 (90 Base) MCG/ACT inhaler  Commonly known as:  PROVENTIL HFA;VENTOLIN HFA  Inhale 2 puffs into the lungs every 6 (six) hours as needed for wheezing or shortness of breath.     amLODipine 10 MG tablet  Commonly known as:  NORVASC  TAKE ONE TABLET BY MOUTH ONCE DAILY     apixaban 5 MG Tabs tablet  Commonly known as:  ELIQUIS  Take 1 tablet (5 mg total) by mouth 2 (two) times daily.     atorvastatin 20 MG tablet  Commonly known as:  LIPITOR   Take 1 tablet (20 mg total) by mouth daily.     bimatoprost 0.01 % Soln  Commonly known as:  LUMIGAN  Place 1 drop into both eyes at bedtime.     budesonide 180 MCG/ACT inhaler  Commonly known as:  PULMICORT  Inhale 2 puffs into the lungs 2 (two) times daily.     cetirizine 10 MG tablet  Commonly known as:  ZYRTEC  Take 1 tablet (10 mg total) by mouth daily.     CHOLECALCIFEROL PO  Take 1,000 mg by mouth daily. Vitamin D 3     dorzolamide-timolol 22.3-6.8 MG/ML ophthalmic solution  Commonly known as:  COSOPT  Place 1 drop into both eyes 2 (two) times daily.     dronedarone 400 MG tablet  Commonly known as:  MULTAQ  Take 1 tablet (400 mg total) by mouth 2 (two) times daily with a meal.     finasteride 5 MG tablet  Commonly known as:  PROSCAR  Take 5 mg by mouth daily.     fluticasone 50 MCG/ACT nasal spray  Commonly known as:  FLONASE  Place 2 sprays into both nostrils 2 (two) times daily.     gabapentin 800 MG tablet  Commonly known as:  NEURONTIN  Take 800 mg by mouth 3 (three) times daily.     multivitamin tablet  Take 1 tablet by mouth daily.     pantoprazole 40 MG tablet  Commonly known as:  PROTONIX  Take 1 tablet (40 mg total) by mouth daily.     tadalafil 5 MG tablet  Commonly known as:  CIALIS  Take 5 mg by mouth daily. Reported on 03/12/2016     triamterene-hydrochlorothiazide 37.5-25 MG tablet  Commonly known as:  MAXZIDE-25  TAKE ONE TABLET BY MOUTH ONCE DAILY        Disposition:  Discharge Instructions    Diet - low sodium heart healthy    Complete by:  As directed      Increase activity slowly    Complete by:  As directed           Follow-up Information    Follow up with [facility] On 05/26/2016.   Specialty:  Cardiology   Why:  at 3:30PM for wound check    Contact information:   [address], [place] [phone]      Follow up with Mirtie Bastyr [name], MD On 08/02/2016.    Specialty:  Cardiology   Why:  at 8:15PM   Contact information:   [place] [address] [phone]       Duration of Discharge Encounter: Greater than 30 minutes including physician time.  Signed, Chanetta Marshall, NP 05/13/2016 7:02 AM   I have seen and examined this patient with Chanetta Marshall.  Agree with above, note added to reflect my findings.  On exam, regular rhythm, no murmurs, lungs clear.  Presented for cardioversion yesterday but  had significant bradycardia.  Decision made to implant dual chamber pacemaker with cardioversion.  Tolerated procedure well without complication.  Device interrogation today shows AV paced rhythm.  Plan for follow up in device clinic in 10 days.  Josehua Hammar M. Porsha Skilton MD 05/13/2016 8:09 AM

## 2016-05-14 NOTE — Progress Notes (Signed)
Dr Curt Bears spoke with patient about pacemaker placement d/t ongoing symptomatic bradycardia.  Decision was made to send patient to cath lab to be paced and then possibly cardiovert later in the day.  Patient and wife both agreed to the plan and patient was then sent to the cath lab per Dr Curt Bears.

## 2016-05-26 ENCOUNTER — Encounter: Payer: Self-pay | Admitting: Cardiology

## 2016-05-26 ENCOUNTER — Ambulatory Visit (INDEPENDENT_AMBULATORY_CARE_PROVIDER_SITE_OTHER): Payer: Medicare Other | Admitting: *Deleted

## 2016-05-26 DIAGNOSIS — I4891 Unspecified atrial fibrillation: Secondary | ICD-10-CM | POA: Diagnosis not present

## 2016-05-26 LAB — CUP PACEART INCLINIC DEVICE CHECK
Battery Remaining Longevity: 97 mo
Battery Voltage: 3.05 V
Brady Statistic AP VP Percent: 61.8 %
Brady Statistic AP VS Percent: 0 %
Brady Statistic AS VP Percent: 37.79 %
Brady Statistic AS VS Percent: 0.41 %
Brady Statistic RA Percent Paced: 61.8 %
Brady Statistic RV Percent Paced: 99.59 %
Date Time Interrogation Session: 20170531160530
Implantable Lead Implant Date: 20170517
Implantable Lead Implant Date: 20170517
Implantable Lead Location: 753859
Implantable Lead Location: 753860
Implantable Lead Model: 5076
Implantable Lead Model: 5076
Lead Channel Impedance Value: 380 Ohm
Lead Channel Impedance Value: 475 Ohm
Lead Channel Impedance Value: 494 Ohm
Lead Channel Impedance Value: 570 Ohm
Lead Channel Pacing Threshold Amplitude: 0.5 V
Lead Channel Pacing Threshold Amplitude: 1 V
Lead Channel Pacing Threshold Pulse Width: 0.4 ms
Lead Channel Pacing Threshold Pulse Width: 0.4 ms
Lead Channel Sensing Intrinsic Amplitude: 13.75 mV
Lead Channel Sensing Intrinsic Amplitude: 3.5 mV
Lead Channel Setting Pacing Amplitude: 3.5 V
Lead Channel Setting Pacing Amplitude: 3.5 V
Lead Channel Setting Pacing Pulse Width: 0.4 ms
Lead Channel Setting Sensing Sensitivity: 2.8 mV

## 2016-05-26 NOTE — Progress Notes (Signed)
Wound check appointment. Steri-strips removed. Wound without redness or edema. Incision edges approximated, wound well healed. Normal device function. Thresholds, sensing, and impedances consistent with implant measurements. Device programmed at 3.5V/auto capture programmed on for extra safety margin until 3 month visit. Histogram distribution appropriate for patient and level of activity. 2 AT/AF episodes (27.8%), AF +Eliquis, longest duration 4 days. No high ventricular rates noted. Patient educated about wound care, arm mobility, lifting restrictions. ROV in 3 months with WC.

## 2016-06-02 ENCOUNTER — Other Ambulatory Visit: Payer: Self-pay | Admitting: Internal Medicine

## 2016-06-03 MED ORDER — AMLODIPINE BESYLATE 10 MG PO TABS: 10.0000 mg | ORAL_TABLET | Freq: Every day | ORAL | Status: DC

## 2016-06-07 ENCOUNTER — Ambulatory Visit (INDEPENDENT_AMBULATORY_CARE_PROVIDER_SITE_OTHER): Payer: Medicare Other | Admitting: Nurse Practitioner

## 2016-06-07 ENCOUNTER — Encounter: Payer: Self-pay | Admitting: Nurse Practitioner

## 2016-06-07 VITALS — BP 107/68 | HR 66 | Resp 18 | Ht 70.0 in | Wt 196.0 lb

## 2016-06-07 DIAGNOSIS — Z9989 Dependence on other enabling machines and devices: Principal | ICD-10-CM

## 2016-06-07 DIAGNOSIS — G63 Polyneuropathy in diseases classified elsewhere: Secondary | ICD-10-CM | POA: Diagnosis not present

## 2016-06-07 DIAGNOSIS — I25810 Atherosclerosis of coronary artery bypass graft(s) without angina pectoris: Secondary | ICD-10-CM | POA: Diagnosis not present

## 2016-06-07 DIAGNOSIS — G4733 Obstructive sleep apnea (adult) (pediatric): Secondary | ICD-10-CM

## 2016-06-07 DIAGNOSIS — M5489 Other dorsalgia: Secondary | ICD-10-CM | POA: Diagnosis not present

## 2016-06-07 DIAGNOSIS — D472 Monoclonal gammopathy: Secondary | ICD-10-CM | POA: Diagnosis not present

## 2016-06-07 DIAGNOSIS — M25561 Pain in right knee: Secondary | ICD-10-CM | POA: Diagnosis not present

## 2016-06-07 NOTE — Progress Notes (Signed)
I agree with the assessment and plan as directed by NP .The patient is known to me .   Charlotte Fidalgo, MD  

## 2016-06-07 NOTE — Patient Instructions (Signed)
CPAP compliance excellent  Continue to walk every day for overall health and well-being F/U in 1 year next with Dr. Brett Fairy

## 2016-06-07 NOTE — Progress Notes (Signed)
[facility]  PATIENT: Derrick [name] DOB: [birth year]   REASON FOR VISIT: Follow-up for obstructive sleep apnea, axonal neuropathy associated with MGUS HISTORY FROM: Patient    HISTORY OF PRESENT ILLNESS: HISTORY: Derrick [name] is a 75 y.o. [name] , caucasian of french, scott -irish and native [place] ( [name]) decent . Is seen here as a revisit from Dr. [name] . His PCP is Dr. Ronnald Ramp , his ophthalmologist is Dr. [name].  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 He had been for at least 12 years CPAP treatment at a pressure of 16 cm water, he was also using a full face mask at the time , which was comfortable.  His original diagnostic sleep study took place in 2000. The patient endorsed Epworth sleepiness score at the of 24 points in the FSS at an elevated rate of 42 points as well. His BMI at the time was 30.  Meanwhile he had lost 65 pounds. Derrick [name] underwent a sleep study on 11-30-2011. His AHI at time of study was 42.1 and his RDI 45.5 his REM AHI was 30 and non-REM 42.9 he did not have any supine sleep. He had oxygen desaturations to a nadir of 82% and had a total desaturation time of 52.6 minutes with a the first 3 hours of sleep.The results were indicative of severe sleep apnea and the patient was titrated to CPAP on 12-05-2011 at the pressure of only 8 cm. He had REM rebound at this setting ,the residual AHI in the night was 0.3 per hour. He also recommended the use of a nasal pillow of possible. The CPAP was download it in 2013 after 90 days of CPAP he was with a new pressure. He had 100% compliance rate and residual AHI of 3.4 with only moderate to mild air leak that pressure was 8 cm water. There are peak time per day was 8 hours and 48 minutes. CPAP  still set at 8 cm water with 3 cm EPR.  The air leak has slightly increased the residual AHI is 2.2, even better than last year. The therapeutic time is 8 hour and 53 minutes. The patient has 100% compliance. Today he endorsed Epworth sleepiness score at 0 point and the fatigue severity score at only 15 points.  Geriatric depression score was endorsed at he only scored 1 point not suggestive of clinical depression. Interval history ; Derrick [name] has also a monoclonal gammopathy and has been treated with rituximab the medication had continuously improved his exercise tolerance, finally he was able to walk 30-40 minutes a day.  He has been exercising daily.  He did develop not neuropathic pain, but some and knee pain on the right and an He has comMRI was now showing DDD on the correspondin spinal level. Completed his rituximab treatment with Dr. [name]. in February 2014 .He underwent a total of 3 steroid injections at the L5-S1 distribution which had meanwhile shown a benefit.  He has been on gabapentin for neuropathic pain, seems to be related to his MGUS diagnosis .He had developed a severe cold intolerance progressively actually the last year. He has noticed since March 2014 and since his Retuximab treatment was completed, that he has some cold cramping as well and toe cramps, pain and numbness.  Last Time I ordered NCS and EMG.  Dr Jannifer Franklin did a EMG and NCS, and found axonal damage , not a finding of CIDP. This was after 3 years of MGUS treatment and I answered his questions, "did I ever have CIDP'? I believe ,yes. 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  The patient has hp pain but is ambulatory, he periodically loses control of his foot lifting muscles - stretching exercises help. As to his obstructive sleep apnea, there is no need for any  adjustment at this time. His current settings are comfortable and has residual AHI is very low. In office download confirmed high compliance 02-13-14 . He is extremely intolerant to cold temperature, and has anhydrosis - this is more often seen in dysautonomia with NP. He was identified as axonal neuropathy by Dr. Jannifer Franklin. I would like to follow the patient once a year for the CPAP exclusively for sleep - RV in 12 month . The CPAP compliance report for 10-16-14 reveals a AHI of 1.2 and daily user time of 9 hours and Derrick minutes, and 100% over 4 hour compliance over 4 hours of nightly use.  He has spinal stenosis - 4 times daily 400 mg neurontin.  A revisit will be scheduled in 12 months. Labs , CBC and Diff by oncologist. Cold agglutinin ordered. Plasma -Electrophoresis reordered.   03-26-15  Interval history Derrick [name] reports that he was recently babysitting the grandchildren in [place] and has noticed that he has less stamina less strength especially in his proximal leg muscles, and he was just yesterday told that he had an abnormal cardiac stress test. He has known coronary artery disease and a stent in the right main coronary artery he was now diagnosed with coronary artery disease in the left main. Catheterization is planned for Monday. As to his obstructive sleep apnea he is doing very well his compliance report from 03-26-15 shows 97% compliance for days of use and for 4 hours of use the average therapy time is 8 hours and 39 minutes at 9 cm water pressure with 2 cm EPR, residual AHI is 1.7. Fatigue severity score is 23 and Epworth sleepiness score is 1. the patient recently has had some desire to take some daytime naps which she has not had for a long time or ever. Back from [place] , he noted a black toe nail, without history of trauma. Was this related to blood clot? Its only the nail , not the whole toe. It is tender to touch. He has an axonal neuropathy. MGUS, will refer to  [place] 10/08/15 Derrick [name] returns for followup after last visit with Dr. Brett Fairy 03/26/2015. He has a history of obstructive sleep apnea and his compliance is 100% for 30 days usage. Average usage is 9 hours 38 minutes at 9 cm of water pressure with 2 EPR level and residual AHI 1.6. ESS score is 0 and fatigue severity score is 22. His neuropathy with MGUS is followed by Dr. Alvy Bimler. He returns for reevaluation UPDATE 06/12/2017CM Derrick [name], 75 year old [name] returns for follow-up. He has history of obstructive sleep apnea and his compliance report today is 100% for 30 days usage. Average usage is 10 hours 13 minutes at 9 cm of pressure. AHI 3.3. His neuropathy with MGUS is followed by Dr.Gorsuch. He had an episode of atrial fibrillation and bradycardia and now has a pacemaker. He has arthritis and complains with right  knee pain however he says he continues to walk on a daily basis. He returns for reevaluation  REVIEW OF SYSTEMS: Full Derrick system review of systems performed and notable only for those listed, all others are neg:  Constitutional: Fatigue  Cardiovascular: Palpitations Ear/Nose/Throat: neg  Skin: neg Eyes: neg Respiratory: neg Gastroitestinal: neg  Hematology/Lymphatic: neg  Endocrine: neg Musculoskeletal: Joint pain, walking difficulty, neck stiffness, right knee pain Allergy/Immunology: neg Neurological: Numbness Psychiatric: neg Sleep : Obstructive sleep apnea with CPAP ALLERGIES: No Known Allergies  HOME MEDICATIONS: Outpatient Prescriptions Prior to Visit  Medication Sig Dispense Refill  . albuterol (PROVENTIL HFA;VENTOLIN HFA) 108 (90 BASE) MCG/ACT inhaler Inhale 2 puffs into the lungs every 6 (six) hours as needed for wheezing or shortness of breath.    [name] amLODipine (NORVASC) 10 MG tablet Take 1 tablet (10 mg total) by mouth daily. 90 tablet 2  . apixaban (ELIQUIS) 5 MG TABS tablet Take 1 tablet (5 mg total) by mouth 2 (two) times daily. 180 tablet 3  .  atorvastatin (LIPITOR) 20 MG tablet Take 1 tablet (20 mg total) by mouth daily. 90 tablet 3  . bimatoprost (LUMIGAN) 0.01 % SOLN Place 1 drop into both eyes at bedtime.    . budesonide (PULMICORT) 180 MCG/ACT inhaler Inhale 2 puffs into the lungs 2 (two) times daily. 3 each 3  . cetirizine (ZYRTEC) 10 MG tablet Take 1 tablet (10 mg total) by mouth daily. 30 tablet 11  . CHOLECALCIFEROL PO Take 1,000 mg by mouth daily. Vitamin D 3    . dorzolamide-timolol (COSOPT) 22.3-6.8 MG/ML ophthalmic solution Place 1 drop into both eyes 2 (two) times daily.     [name] dronedarone (MULTAQ) 400 MG tablet Take 1 tablet (400 mg total) by mouth 2 (two) times daily with a meal. 180 tablet 3  . finasteride (PROSCAR) 5 MG tablet Take 5 mg by mouth daily.     . fluticasone (FLONASE) 50 MCG/ACT nasal spray Place 2 sprays into both nostrils 2 (two) times daily.    [name] gabapentin (NEURONTIN) 800 MG tablet Take 800 mg by mouth 3 (three) times daily.    . Multiple Vitamin (MULTIVITAMIN) tablet Take 1 tablet by mouth daily.      . pantoprazole (PROTONIX) 40 MG tablet Take 1 tablet (40 mg total) by mouth daily. 90 tablet 3  . tadalafil (CIALIS) 5 MG tablet Take 5 mg by mouth daily. Reported on 05/26/2016    . triamterene-hydrochlorothiazide (MAXZIDE-25) 37.5-25 MG tablet TAKE ONE TABLET BY MOUTH ONCE DAILY 90 tablet 3   No facility-administered medications prior to visit.    PAST MEDICAL HISTORY: Past Medical History  Diagnosis Date  . BENIGN PROSTATIC HYPERTROPHY, WITH OBSTRUCTION 05/28/2010  . COLONIC POLYPS, ADENOMATOUS, HX OF 05/28/2010  . GERD 05/28/2010  . HYPERLIPIDEMIA 05/28/2010  . MITRAL VALVE PROLAPSE 05/28/2010  . 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 PALPITATIONS, CHRONIC 05/28/2010  . UNSPECIFIED INFLAMMATORY AND TOXIC NEUROPATHY 05/28/2010  . VITAMIN D DEFICIENCY 05/28/2010  . 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 Vision loss   . Sarcoidosis ([place])     pulmonalis  . Hypertension   . Parotid gland  pain 2010    infection  . 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  . Diverticulitis   . 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 DISH (diffuse idiopathic skeletal hyperostosis) 12/02/2015  . Presence of permanent cardiac pacemaker   . Complication of anesthesia     "stopped breathing; related to my sleep apnea"   .  OSA on CPAP 05/28/2010  . Heart murmur   . NSTEMI (non-ST elevated myocardial infarction) ([place]) 06/03/11  . COPD (chronic obstructive pulmonary disease) ([place])     "associated w/lung sarcoidosis"  . Pneumonia 2-3 times  . Intrinsic asthma, 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    "associated w/lung sarcoidosis"  . 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 Peripheral neuropathy ([place])     "tx'd w/targeted chemo" (05/12/2016)  . IgM lambda paraproteinemia   . GENERALIZED OSTEOARTHROSIS UNSPECIFIED SITE 05/28/2010  . Chronic back pain   . History of gout   . Malignant neoplasm of descending colon ([place]) 05/28/2010  . Colon cancer ([place]) dx'd 2000    "left"  . CAD (coronary artery disease)     a. s/p NSTEMI 06/01/11: DES to RCA;  b. cath 06/25/11:   dLM 50-60% (FFR 0.87), prox to mid LAD 40-50%, D1 50%, pCFX 50%, RCA stent ok, dPDA 80%, EF 55-60%.  His FFR was felt to be negative and therefore medical therapy was recommended ;  echo 6/12: EF 55-60%, mild AS     PAST SURGICAL HISTORY: Past Surgical History  Procedure Laterality Date  . Lung surgery  2001    "open lung dissection"  . Appendectomy  1954  . Knee arthroscopy Right 2003  . Coronary angioplasty with stent placement  06/03/11  . Cataract extraction w/ intraocular lens implant Left   . Left heart catheterization with coronary angiogram N/A 03/31/2015    Procedure: LEFT HEART CATHETERIZATION WITH CORONARY ANGIOGRAM;  Surgeon: [name], MD;  Location: [facility] CATH LAB;  Service: Cardiovascular;  Laterality: N/A;  . Insert / replace / remove pacemaker  05/12/2016  . Tonsillectomy  1950  . Mohs surgery Left ~  2008    ear  . Prostate biopsy  2010  . Colon surgery  2000    decending colon   . Electrophysiologic study N/A 05/12/2016    Procedure: Cardioversion;  Surgeon: [name], MD;  Location: [facility] CV LAB;  Service: Cardiovascular;  Laterality: N/A;  . Ep implantable device N/A 05/12/2016    Procedure: Pacemaker Implant;  Surgeon: [name], MD;  Location: [place] CV LAB;  Service: Cardiovascular;  Laterality: N/A;    FAMILY HISTORY: Family History  Problem Relation Age of Onset  . Arthritis Mother     severe  . Coronary artery disease Mother   . Colon polyps Mother   . Coronary artery disease Brother   . Cancer Brother     choriocarcinoma  . Arrhythmia Brother 54    Afib/Tachycardia    SOCIAL HISTORY: Social History   Social History  . Marital Status: Married    Spouse Name: N/A  . Number of Children: 2  . Years of Education: MS-Coll.   Occupational History  . Consultant     Chemical Engineer-former   Social History Main Topics  . Smoking status: Never Smoker   . Smokeless tobacco: Never Used  . Alcohol Use: 10.8 oz/week    0 Standard drinks or equivalent, 18 Glasses of wine per week     Comment: 05/12/2016 "10oz wine qd"  . Drug Use: No  . Sexual Activity: Yes    Birth Control/ Protection: None   Other Topics Concern  . Not on file   Social History Narrative   College- [employer]; [employer]-environment science and mgt. Married [name])  "3" 1 son- 43; 1 dtr "2" 2 grandchildren. 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 End of life-provided discussive context and provided  packet.     PHYSICAL EXAM  Filed Vitals:   06/07/16 0736  BP: 107/68  Pulse: 66  Resp: 18  Height: 5\' 10"  (1.778 m)  Weight: 196 lb (88.905 kg)   Body mass index is 28.12 kg/(m^2). General: The patient is awake, alert and appears not in acute distress. The patient is well groomed. Head: Normocephalic, atraumatic.  Neck is supple.Neck size 15   Cardiovascular: Regular rate and rhythm, without murmurs  Skin: Without evidence of  rash Neurologic exam : The patient is awake and alert, oriented to place and time. Memory subjective described as intact. There is a normal attention span & concentration ability. Speech is fluent without dysarthria, dysphonia or aphasia. Mood and affect are appropriate. Cranial nerves:Pupils are equal and briskly reactive to light.  Extraocular movements in vertical and horizontal planes intact and without nystagmus. Visual fields by finger perimetry are intact.Hearing to finger rub intact. Facial sensation intact to fine touch. Facial motor strength is symmetric and tongue and uvula move midline. Motor exam: Normal tone and normal muscle bulk and symmetric normal strength in all extremities. Good grip strength is noted. He has a foot drop on the right foot, that is progressed since 2013.  Sensory: Fine touch, pinprick and vibration were tested in all extremities.  There is a slight decrease of vibratory sense at the ankle level, but the patient reports numbness - Coordination: Rapid alternating movements in the fingers/hands is tested and normal. Finger-to-nose maneuver tested and normal without evidence of ataxia, dysmetria or tremor. Gait and station: Patient walks without assistive device and is able and assisted stool climb up to the exam table. Strength within normal limits.  Stance is stable and normal. Tandem gait is fragmented, he needs to look where he steps, lost his " sense of where in space " he is . Romberg testing is positive. Deep tendon reflexes: in the upper and lower extremities are attenuated ,Babinski maneuver response is downgoing. DIAGNOSTIC DATA (LABS, IMAGING, TESTING) - I reviewed patient records, labs, notes, testing and imaging myself where available.  Lab Results  Component Value Date   WBC 6.3 05/03/2016   HGB 13.3 05/12/2016   HCT 39.0 05/12/2016   MCV 91.4  05/03/2016   PLT 189 05/03/2016      Component Value Date/Time   NA 140 05/12/2016 1314   NA 141 10/20/2015 0847   K 3.4* 05/12/2016 1314   K 4.0 10/20/2015 0847   CL 103 05/12/2016 1314   CL 104 03/23/2013 0855   CO2 28 05/03/2016 0949   CO2 29 10/20/2015 0847   GLUCOSE 86 05/12/2016 1314   GLUCOSE 131 10/20/2015 0847   GLUCOSE 98 03/23/2013 0855   BUN 15 05/12/2016 1314   BUN 15.7 10/20/2015 0847   CREATININE 1.00 05/12/2016 1314   CREATININE 0.93 05/03/2016 0949   CREATININE 1.0 10/20/2015 0847   CREATININE 0.87 07/30/2011 0733   CALCIUM 9.6 05/03/2016 0949   CALCIUM 10.2 10/20/2015 0847   PROT 6.5 03/19/2016 0922   PROT 7.0 10/20/2015 0847   ALBUMIN 4.0 03/19/2016 0922   ALBUMIN 4.3 10/20/2015 0847   AST 26 03/19/2016 0922   AST 30 10/20/2015 0847   ALT 32 03/19/2016 0922   ALT 27 10/20/2015 0847   ALKPHOS 46 03/19/2016 0922   ALKPHOS 54 10/20/2015 0847   BILITOT 0.9 03/19/2016 0922   BILITOT 0.80 10/20/2015 0847   GFRNONAA >60 06/05/2011 0350   GFRAA >60 06/05/2011 0350   Lab Results  Component Value  Date   CHOL 139 03/19/2016   HDL 51 03/19/2016   LDLCALC 74 03/19/2016   TRIG 70 03/19/2016   CHOLHDL 2.7 03/19/2016    Lab Results  Component Value Date   [id number] 02/16/2016   Lab Results  Component Value Date   TSH 2.47 02/09/2016      ASSESSMENT AND PLAN Derrick [name] here with to follow-up for his obstructive sleep apnea with CPAP.His compliance is 100% for 30 days usage. Average usage is 10 hours 13 minutes at 9 cm of water pressure with 2 EPR level and residual AHI 3.3.  CPAP compliance excellent  Continue to walk every day for overall health and well-being F/U 1 year Derrick [name], [facility], [facility], APRN  [facility] [address] [place], [place] [address] [phone]

## 2016-07-13 ENCOUNTER — Encounter: Payer: Self-pay | Admitting: *Deleted

## 2016-07-26 DIAGNOSIS — H903 Sensorineural hearing loss, bilateral: Secondary | ICD-10-CM | POA: Diagnosis not present

## 2016-07-26 DIAGNOSIS — G4733 Obstructive sleep apnea (adult) (pediatric): Secondary | ICD-10-CM | POA: Diagnosis not present

## 2016-07-26 DIAGNOSIS — H9313 Tinnitus, bilateral: Secondary | ICD-10-CM | POA: Diagnosis not present

## 2016-07-26 DIAGNOSIS — H9312 Tinnitus, left ear: Secondary | ICD-10-CM | POA: Diagnosis not present

## 2016-07-26 DIAGNOSIS — Z9989 Dependence on other enabling machines and devices: Secondary | ICD-10-CM | POA: Diagnosis not present

## 2016-07-27 ENCOUNTER — Other Ambulatory Visit (HOSPITAL_BASED_OUTPATIENT_CLINIC_OR_DEPARTMENT_OTHER): Payer: Medicare Other

## 2016-07-27 DIAGNOSIS — H401122 Primary open-angle glaucoma, left eye, moderate stage: Secondary | ICD-10-CM | POA: Diagnosis not present

## 2016-07-27 DIAGNOSIS — D472 Monoclonal gammopathy: Secondary | ICD-10-CM | POA: Diagnosis not present

## 2016-07-27 DIAGNOSIS — H2511 Age-related nuclear cataract, right eye: Secondary | ICD-10-CM | POA: Diagnosis not present

## 2016-07-27 DIAGNOSIS — H401112 Primary open-angle glaucoma, right eye, moderate stage: Secondary | ICD-10-CM | POA: Diagnosis not present

## 2016-07-27 DIAGNOSIS — H25011 Cortical age-related cataract, right eye: Secondary | ICD-10-CM | POA: Diagnosis not present

## 2016-07-27 LAB — COMPREHENSIVE METABOLIC PANEL
ALT: 19 U/L (ref 0–55)
AST: 24 U/L (ref 5–34)
Albumin: 4.3 g/dL (ref 3.5–5.0)
Alkaline Phosphatase: 53 U/L (ref 40–150)
Anion Gap: 9 mEq/L (ref 3–11)
BUN: 16 mg/dL (ref 7.0–26.0)
CO2: 30 mEq/L — ABNORMAL HIGH (ref 22–29)
Calcium: 10.5 mg/dL — ABNORMAL HIGH (ref 8.4–10.4)
Chloride: 103 mEq/L (ref 98–109)
Creatinine: 1 mg/dL (ref 0.7–1.3)
EGFR: 73 mL/min/{1.73_m2} — ABNORMAL LOW (ref >90)
Glucose: 122 mg/dl (ref 70–140)
Potassium: 3.9 mEq/L (ref 3.5–5.1)
Sodium: 141 mEq/L (ref 136–145)
Total Bilirubin: 0.84 mg/dL (ref 0.20–1.20)
Total Protein: 7.5 g/dL (ref 6.4–8.3)

## 2016-07-27 LAB — CBC WITH DIFFERENTIAL/PLATELET
BASO%: 1.2 % (ref 0.0–2.0)
Basophils Absolute: 0.1 10*3/uL (ref 0.0–0.1)
EOS%: 3.4 % (ref 0.0–7.0)
Eosinophils Absolute: 0.2 10*3/uL (ref 0.0–0.5)
HCT: 40.8 % (ref 38.4–49.9)
HGB: 14.1 g/dL (ref 13.0–17.1)
LYMPH%: 21.7 % (ref 14.0–49.0)
MCH: 30.6 pg (ref 27.2–33.4)
MCHC: 34.4 g/dL (ref 32.0–36.0)
MCV: 88.8 fL (ref 79.3–98.0)
MONO#: 0.5 10*3/uL (ref 0.1–0.9)
MONO%: 10.7 % (ref 0.0–14.0)
NEUT#: 2.9 10*3/uL (ref 1.5–6.5)
NEUT%: 63 % (ref 39.0–75.0)
Platelets: 178 10*3/uL (ref 140–400)
RBC: 4.6 10*6/uL (ref 4.20–5.82)
RDW: 14.7 % — ABNORMAL HIGH (ref 11.0–14.6)
WBC: 4.7 10*3/uL (ref 4.0–10.3)
lymph#: 1 10*3/uL (ref 0.9–3.3)

## 2016-07-28 LAB — KAPPA/LAMBDA LIGHT CHAINS
Ig Kappa Free Light Chain: 18.1 mg/L (ref 3.3–19.4)
Ig Lambda Free Light Chain: 13.5 mg/L (ref 5.7–26.3)
Kappa/Lambda FluidC Ratio: 1.34 (ref 0.26–1.65)

## 2016-07-28 LAB — BETA 2 MICROGLOBULIN, SERUM: Beta-2: 1.6 mg/L (ref 0.6–2.4)

## 2016-07-29 LAB — MULTIPLE MYELOMA PANEL, SERUM
Albumin SerPl Elph-Mcnc: 4.1 g/dL (ref 2.9–4.4)
Albumin/Glob SerPl: 1.6 (ref 0.7–1.7)
Alpha 1: 0.2 g/dL (ref 0.0–0.4)
Alpha2 Glob SerPl Elph-Mcnc: 0.6 g/dL (ref 0.4–1.0)
B-Globulin SerPl Elph-Mcnc: 0.9 g/dL (ref 0.7–1.3)
Gamma Glob SerPl Elph-Mcnc: 1 g/dL (ref 0.4–1.8)
Globulin, Total: 2.7 g/dL (ref 2.2–3.9)
IgA, Qn, Serum: 161 mg/dL (ref 61–437)
IgG, Qn, Serum: 825 mg/dL (ref 700–1600)
IgM, Qn, Serum: 323 mg/dL — ABNORMAL HIGH (ref 15–143)
M Protein SerPl Elph-Mcnc: 0.2 g/dL — ABNORMAL HIGH
Total Protein: 6.8 g/dL (ref 6.0–8.5)

## 2016-08-01 NOTE — Progress Notes (Signed)
Electrophysiology Office Note   Date:  08/02/2016   ID:  Derrick [name], DOB [birth year], MRN [id number]  PCP:  [name], MD  Cardiologist:  Burt Knack Primary Electrophysiologist:  Oliviarose Punch [name], MD    Chief Complaint  Patient presents with  . Atrial Fibrillation     History of Present Illness: Derrick [name] is a 75 y.o. male who presents today for electrophysiology evaluation.   He has been followed for coronary artery disease. He has a history of myocardial infarction and drug-eluting stent placement in the right coronary artery. 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 He's been managed medically. He has multiple noncardiac problems including CIDP, pulmonary sarcoid, and monoclonal gammopathy of undetermined significance. He last underwent cardiac catheterization in April 2016 showing stable coronary anatomy.    Today, he denies symptoms of chest pain, orthopnea, PND, lower extremity edema, claudication, dizziness, presyncope, syncope, bleeding, or neurologic sequela. The patient is tolerating medications without difficulties.  He does have sleep apnea, and wears a CPAP.  He recently took a trip to [place] to take care of his grandchildren, and is felt well since then. On interrogation of his device he has not had atrial fibrillation since the beginning of June. He says that he is doing well eating healthier diet and walking up to 30 minutes a day. He is asking whether or not it is safe for him to ride a bike.   Past Medical History:  Diagnosis Date  . BENIGN PROSTATIC HYPERTROPHY, WITH OBSTRUCTION 05/28/2010  . CAD (coronary artery disease)    a. s/p NSTEMI 06/01/11: DES to RCA;  b. cath 06/25/11:   dLM 50-60% (FFR 0.87), prox to mid LAD 40-50%, D1 50%, pCFX 50%, RCA stent ok, dPDA 80%, EF 55-60%.  His FFR was felt to be negative and therefore medical therapy was recommended ;  echo 6/12: EF 55-60%, mild AS   . Chronic  back pain   . CIDP (chronic inflammatory demyelinating polyneuropathy) ([place]) 04/11/2012  . Colon cancer ([place]) dx'd 2000   "left"  . COLONIC POLYPS, ADENOMATOUS, HX OF 05/28/2010  . Complication of anesthesia    "stopped breathing; related to my sleep apnea"   . COPD (chronic obstructive pulmonary disease) ([place])    "associated w/lung sarcoidosis"  . 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 DISH (diffuse idiopathic skeletal hyperostosis) 12/02/2015  . Diverticulitis   . GENERALIZED OSTEOARTHROSIS UNSPECIFIED SITE 05/28/2010  . GERD 05/28/2010  . Heart murmur   . History of gout   . HYPERLIPIDEMIA 05/28/2010  . Hypertension   . IgM lambda paraproteinemia   . 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 Malignant neoplasm of descending colon ([place]) 05/28/2010  . MITRAL VALVE PROLAPSE 05/28/2010  . 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 NSTEMI (non-ST elevated myocardial infarction) ([place]) 06/03/11  . 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  . OSA on CPAP 05/28/2010  . PALPITATIONS, CHRONIC 05/28/2010  . Parotid gland pain 2010   infection  . Peripheral neuropathy ([place])    "tx'd w/targeted chemo" (05/12/2016)  . Pneumonia 2-3 times  . Presence of permanent cardiac pacemaker   . 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 Sarcoidosis ([place])    pulmonalis  . UNSPECIFIED INFLAMMATORY AND TOXIC NEUROPATHY 05/28/2010  . Vision loss   . VITAMIN D DEFICIENCY 05/28/2010   Past Surgical History:  Procedure Laterality Date  . APPENDECTOMY  1954  . CATARACT EXTRACTION W/ INTRAOCULAR LENS  IMPLANT Left   . COLON SURGERY  2000   decending colon   . CORONARY ANGIOPLASTY WITH STENT PLACEMENT  06/03/11  . ELECTROPHYSIOLOGIC STUDY N/A 05/12/2016   Procedure: Cardioversion;  Surgeon: Izabellah Dadisman [name], MD;  Location: [facility];  Service: Cardiovascular;  Laterality: N/A;  . EP IMPLANTABLE DEVICE N/A 05/12/2016   Procedure: Pacemaker Implant;  Surgeon: Thorsten Climer  [name], MD;  Location: [facility];  Service: Cardiovascular;  Laterality: N/A;  . INSERT / REPLACE / REMOVE PACEMAKER  05/12/2016  . KNEE ARTHROSCOPY Right 2003  . LEFT HEART CATHETERIZATION WITH CORONARY ANGIOGRAM N/A 03/31/2015   Procedure: LEFT HEART CATHETERIZATION WITH CORONARY ANGIOGRAM;  Surgeon: [name], MD;  Location: [facility] CATH [facility];  Service: Cardiovascular;  Laterality: N/A;  . LUNG SURGERY  2001   "open lung dissection"  . MOHS SURGERY Left ~ 2008   ear  . PROSTATE BIOPSY  2010  . TONSILLECTOMY  1950     Current Outpatient Prescriptions  Medication Sig Dispense Refill  . albuterol (PROVENTIL HFA;VENTOLIN HFA) 108 (90 BASE) MCG/ACT inhaler Inhale 2 puffs into the lungs every 6 (six) hours as needed for wheezing or shortness of breath.    [name] amLODipine (NORVASC) 10 MG tablet Take 1 tablet (10 mg total) by mouth daily. 90 tablet 2  . apixaban (ELIQUIS) 5 MG TABS tablet Take 1 tablet (5 mg total) by mouth 2 (two) times daily. 180 tablet 3  . atorvastatin (LIPITOR) 20 MG tablet Take 1 tablet (20 mg total) by mouth daily. 90 tablet 3  . bimatoprost (LUMIGAN) 0.01 % SOLN Place 1 drop into both eyes at bedtime.    . budesonide (PULMICORT) 180 MCG/ACT inhaler Inhale 2 puffs into the lungs 2 (two) times daily. 3 each 3  . CHOLECALCIFEROL PO Take 1,000 mg by mouth daily. Vitamin D 3    . dorzolamide-timolol (COSOPT) 22.3-6.8 MG/ML ophthalmic solution Place 1 drop into both eyes 2 (two) times daily.     [name] dronedarone (MULTAQ) 400 MG tablet Take 1 tablet (400 mg total) by mouth 2 (two) times daily with a meal. 180 tablet 3  . finasteride (PROSCAR) 5 MG tablet Take 5 mg by mouth daily.     . fluticasone (FLONASE) 50 MCG/ACT nasal spray Place 2 sprays into both nostrils 2 (two) times daily.    [name] gabapentin (NEURONTIN) 800 MG tablet Take 800 mg by mouth 3 (three) times daily.    . Multiple Vitamin (MULTIVITAMIN) tablet Take 1 tablet by mouth daily.      . pantoprazole  (PROTONIX) 40 MG tablet Take 1 tablet (40 mg total) by mouth daily. 90 tablet 3  . tadalafil (CIALIS) 5 MG tablet Take 5 mg by mouth daily. Reported on 05/26/2016    . triamterene-hydrochlorothiazide (MAXZIDE-25) 37.5-25 MG tablet TAKE ONE TABLET BY MOUTH ONCE DAILY 90 tablet 3  . cetirizine (ZYRTEC) 10 MG tablet Take 1 tablet (10 mg total) by mouth daily. (Patient taking differently: Take 10 mg by mouth daily as needed. ) 30 tablet 11   No current facility-administered medications for this visit.     Allergies:   Review of patient's allergies indicates no known allergies.   Social History:  The patient  reports that he has never smoked. He has never used smokeless tobacco. He reports that he drinks about 10.8 oz of alcohol per week . He reports that he does not use drugs.   Family History:  The patient's family history includes Arrhythmia (age  of onset: 27) in his brother; Arthritis in his mother; Cancer in his brother; Colon polyps in his mother; Coronary artery disease in his brother and mother.    ROS:  Please see the history of present illness.   Otherwise, review of systems is positive for easy bruising.   All other systems are reviewed and negative.    PHYSICAL EXAM: VS:  BP 122/76   Pulse 68   Ht 5\' 10"  (1.778 m)   Wt 193 lb 8 oz (87.8 kg)   BMI 27.76 kg/m  , BMI Body mass index is 27.76 kg/m. GEN: Well nourished, well developed, in no acute distress  HEENT: normal  Neck: no JVD, carotid bruits, or masses Cardiac: RRR; no murmurs, rubs, or gallops,no edema  Respiratory:  clear to auscultation bilaterally, normal work of breathing GI: soft, nontender, nondistended, + BS MS: no deformity or atrophy  Skin: warm and dry Neuro:  Strength and sensation are intact Psych: euthymic mood, full affect  EKG:  EKG is ordered today. Personal review of the ekg ordered today shows sinus rhythm, V paced  Recent Labs: 02/09/2016: TSH 2.47 07/27/2016: ALT 19; BUN 16.0; Creatinine 1.0; HGB  14.1; Platelets 178; Potassium 3.9; Sodium 141    Lipid Panel     Component Value Date/Time   CHOL 139 03/19/2016 0922   TRIG 70 03/19/2016 0922   HDL 51 03/19/2016 0922   CHOLHDL 2.7 03/19/2016 0922   VLDL 14 03/19/2016 0922   LDLCALC 74 03/19/2016 0922     Wt Readings from Last 3 Encounters:  08/02/16 193 lb 8 oz (87.8 kg)  06/07/16 196 lb (88.9 kg)  05/13/16 196 lb 3.4 oz (89 kg)      Other studies Reviewed: Additional studies/ records that were reviewed today include: 03/31/15 Cath, 04/13/16 TTE, 03/31/16 Holter Review of the above records today demonstrates:   Final Conclusions:  1. Moderate distal left mainstem disease, without significant progression from the patient's 2012 cardiac catheterization study 2. Mild to moderate diffusely calcified stenosis of the LAD 3. Mild nonobstructive stenosis of the left circumflex 4. Continued stent patency of the RCA with moderate PDA stenosis 5. Preserved LV systolic function  - Left ventricle: The cavity size was normal. Wall thickness was  normal. Systolic function was vigorous. The estimated ejection  fraction was in the range of 65% to 70%. Wall motion was normal;  there were no regional wall motion abnormalities. The study is  not technically sufficient to allow evaluation of LV diastolic  function. - Aortic valve: Trileaflet; mildly calcified leaflets. Mild  stenosis. Mean gradient (S): 12 mm Hg. Peak gradient (S): 22 mm  Hg. Valve area (VTI): 2.58 cm^2. Valve area (Vmax): 2.92 cm^2.  Valve area (Vmean): 2.91 cm^2. - Aorta: Ascending aortic diameter: 41 mm (S). - Ascending aorta: The ascending aorta is dilated. - Mitral valve: Mildly thickened leaflets . There was mild  regurgitation. - Left atrium: Severely dilated at 57 ml/m2. - Right ventricle: The cavity size was mildly dilated. - Right atrium: Severely dilated at 27 cm2. - Tricuspid valve: There was mild regurgitation. - Pulmonary arteries: PA peak  pressure: 35 mm Hg (S). - Inferior vena cava: The vessel was dilated. The respirophasic  diameter changes were blunted (< 50%), consistent with elevated  central venous pressure.   The basic rhythm is atrial fibrillation  There are aberrated beats or PVC's and a short run of wide-complex tachycardia, suspect AF with aberrancy  There are episodes of slow atrial fibrillation  ASSESSMENT AND PLAN:  1.  Atrial fibrillation: He has a severely dilated LA and thus may have been in AF for quite a while.  He is on Eliquis.  I have discussed with him options of treatment for atrial fibrillation. Pacemaker placed 05/12/16.  Feeling well with no atrial fibrillation since the beginning of June. We'll continue his Multaq.  This patients CHA2DS2-VASc Score and unadjusted Ischemic Stroke Rate (% per year) is equal to 3.2 % stroke rate/year from a score of 3  Above score calculated as 1 point each if present [CHF, HTN, DM, Vascular=MI/PAD/Aortic Plaque, Age if 65-74, or Male] Above score calculated as 2 points each if present [Age > 75, or Stroke/TIA/TE]  2. Hypertension: well controlled, no changes made  3. Hyperlipidemia: Continue statin  4. Coronary artery disease: Currently not having any chest pain. He is continuing to exercise. I told him it is okay to go biking for exercise.  Current medicines are reviewed at length with the patient today.   The patient does not have concerns regarding his medicines.  The following changes were made today:  multaq  Labs/ tests ordered today include:  No orders of the defined types were placed in this encounter.    Disposition:   FU with Prestin Munch 9 months  Signed, Jillayne Witte [name], MD  08/02/2016 8:26 AM     [facility] [address] [place] [address] [phone] (office) [phone] (fax)

## 2016-08-02 ENCOUNTER — Encounter: Payer: Self-pay | Admitting: Cardiology

## 2016-08-02 ENCOUNTER — Ambulatory Visit (INDEPENDENT_AMBULATORY_CARE_PROVIDER_SITE_OTHER): Payer: Medicare Other | Admitting: Cardiology

## 2016-08-02 VITALS — BP 122/76 | HR 68 | Ht 70.0 in | Wt 193.5 lb

## 2016-08-02 DIAGNOSIS — I25810 Atherosclerosis of coronary artery bypass graft(s) without angina pectoris: Secondary | ICD-10-CM

## 2016-08-02 DIAGNOSIS — I4891 Unspecified atrial fibrillation: Secondary | ICD-10-CM

## 2016-08-02 DIAGNOSIS — Z950 Presence of cardiac pacemaker: Secondary | ICD-10-CM | POA: Diagnosis not present

## 2016-08-02 LAB — CUP PACEART INCLINIC DEVICE CHECK
Battery Remaining Longevity: 88 mo
Battery Voltage: 3.02 V
Brady Statistic AP VP Percent: 91.75 %
Brady Statistic AP VS Percent: 0 %
Brady Statistic AS VP Percent: 8.19 %
Brady Statistic AS VS Percent: 0.06 %
Brady Statistic RA Percent Paced: 91.75 %
Brady Statistic RV Percent Paced: 99.94 %
Date Time Interrogation Session: 20170807101734
Implantable Lead Implant Date: 20170517
Implantable Lead Implant Date: 20170517
Implantable Lead Location: 753859
Implantable Lead Location: 753860
Implantable Lead Model: 5076
Implantable Lead Model: 5076
Lead Channel Impedance Value: 399 Ohm
Lead Channel Impedance Value: 513 Ohm
Lead Channel Impedance Value: 532 Ohm
Lead Channel Impedance Value: 646 Ohm
Lead Channel Pacing Threshold Amplitude: 0.875 V
Lead Channel Pacing Threshold Amplitude: 0.875 V
Lead Channel Pacing Threshold Pulse Width: 0.4 ms
Lead Channel Pacing Threshold Pulse Width: 0.4 ms
Lead Channel Sensing Intrinsic Amplitude: 11 mV
Lead Channel Sensing Intrinsic Amplitude: 3.75 mV
Lead Channel Setting Pacing Amplitude: 2 V
Lead Channel Setting Pacing Amplitude: 2.5 V
Lead Channel Setting Pacing Pulse Width: 0.4 ms
Lead Channel Setting Sensing Sensitivity: 2.8 mV

## 2016-08-02 NOTE — Patient Instructions (Signed)
Medication Instructions:  Your physician recommends that you continue on your current medications as directed. Please refer to the Current Medication list given to you today.   Labwork: None Ordered   Testing/Procedures: None Ordered   Follow-Up: Remote monitoring is used to monitor your Pacemaker of ICD from home. This monitoring reduces the number of office visits required to check your device to one time per year. It allows [place] to keep an eye on the functioning of your device to ensure it is working properly. You are scheduled for a device check from home on Nov. 6, 2017. You may send your transmission at any time that day. If you have a wireless device, the transmission will be sent automatically. After your physician reviews your transmission, you will receive a postcard with your next transmission date.  Your physician wants you to follow-up in: 9 months with Dr. Curt Bears.  You will receive a reminder letter in the mail two months in advance. If you don't receive a letter, please call our office to schedule the follow-up appointment.   If you need a refill on your cardiac medications before your next appointment, please call your pharmacy.   Thank you for choosing [facility]! Christen Bame, RN [phone]

## 2016-08-03 ENCOUNTER — Encounter: Payer: Self-pay | Admitting: Hematology and Oncology

## 2016-08-03 ENCOUNTER — Telehealth: Payer: Self-pay | Admitting: Hematology and Oncology

## 2016-08-03 ENCOUNTER — Ambulatory Visit (HOSPITAL_BASED_OUTPATIENT_CLINIC_OR_DEPARTMENT_OTHER): Payer: Medicare Other | Admitting: Hematology and Oncology

## 2016-08-03 VITALS — BP 131/70 | HR 73 | Temp 98.4°F | Resp 18 | Ht 70.0 in | Wt 193.6 lb

## 2016-08-03 DIAGNOSIS — Z950 Presence of cardiac pacemaker: Secondary | ICD-10-CM

## 2016-08-03 DIAGNOSIS — G6181 Chronic inflammatory demyelinating polyneuritis: Secondary | ICD-10-CM

## 2016-08-03 DIAGNOSIS — D472 Monoclonal gammopathy: Secondary | ICD-10-CM | POA: Diagnosis not present

## 2016-08-03 DIAGNOSIS — C186 Malignant neoplasm of descending colon: Secondary | ICD-10-CM

## 2016-08-03 NOTE — Assessment & Plan Note (Signed)
The patient has pacemaker implantation due to bradycardia and history of atrial fibrillation. He is on anticoagulation therapy. I review his recent echocardiogram showed no evidence to suggest amyloidosis. I will defer to his cardiologist for further management

## 2016-08-03 NOTE — Telephone Encounter (Signed)
Gave pt cal & avs °

## 2016-08-03 NOTE — Assessment & Plan Note (Signed)
This is stable.  He also had polyarthritis including possibility of ankylosing spondylitis which may respond to rituximab treatment although I have no clinical experience on this. Recent skeletal survey showed he had DISH I would defer his management to his neurologist He was referred to see a rheumatologist in the past

## 2016-08-03 NOTE — Progress Notes (Signed)
[facility] OFFICE PROGRESS NOTE  Patient Care Team: [name], MD as PCP - General (Internal Medicine)  , MD as Consulting Physician (Hematology and Oncology) [name], MD as Consulting Physician (Cardiology)  SUMMARY OF ONCOLOGIC HISTORY:  History of CIDP and IgM monoclonal gammopathy  HISTORY OF PRESENTING ILLNESS:  Derrick [name] was transferred to my care after his prior physician has left.  I reviewed the patient's records extensive and collaborated the history with the patient. 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 He was initially evaluated and treated by a hematologist and a neurologist in [place] before he moved to [place]. 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 He had bone marrow aspirate and biopsy which showed no evidence of malignancy in his bone marrow. His neurologic condition is characterized primarily by painful distal neuropathy, feet worse than hands, and lower extremity weakness. At one point he could barely walk a block without having to stop due to pain and weakness. He was given an initial trial of Rituxan in 2010 with no significant improvement. However following reinitiation of Rituxan in April 2012 he did get a progressive improvement. He was put on a maintenance program which we have continued in this office on an every other month basis through 03/23/2013.  The patient have new onset atrial fibrillation. His echocardiogram shows significant dilated atrium without evidence of increase ventricular wall thickness. 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 INTERVAL HISTORY: Please see below for problem oriented charting. He is doing well. He is currently on chronic anticoagulation therapy due to history  of atrial fibrillation and bradycardia requiring pacemaker implantation. The patient denies any recent signs or symptoms of bleeding such as spontaneous epistaxis, hematuria or hematochezia. He denies worsening neuropathy. He has persistent balance issues. He continues to have arthritis pain in his hands Denies recent infection No new bone pain  REVIEW OF SYSTEMS:   Constitutional: Denies fevers, chills or abnormal weight loss Eyes: Denies blurriness of vision Ears, nose, mouth, throat, and face: Denies mucositis or sore throat Respiratory: Denies cough, dyspnea or wheezes Cardiovascular: Denies palpitation, chest discomfort or lower extremity swelling Gastrointestinal:  Denies nausea, heartburn or change in bowel habits Skin: Denies abnormal skin rashes Lymphatics: Denies new lymphadenopathy or easy bruising Behavioral/Psych: Mood is stable, no new changes  All other systems were reviewed with the patient and are negative.  I have reviewed the past medical history, past surgical history, social history and family history with the patient and they are unchanged from previous note.  ALLERGIES:  has No Known Allergies.  MEDICATIONS:  Current Outpatient Prescriptions  Medication Sig Dispense Refill  . albuterol (PROVENTIL HFA;VENTOLIN HFA) 108 (90 BASE) MCG/ACT inhaler Inhale 2 puffs into the lungs every 6 (six) hours as needed for wheezing or shortness of breath.    . amLODipine (NORVASC) 10 MG tablet Take 1 tablet (10 mg total) by mouth daily. 90 tablet 2  . apixaban (ELIQUIS) 5 MG TABS tablet Take 1 tablet (5 mg total) by mouth 2 (two) times daily. 180 tablet 3  . atorvastatin (LIPITOR) 20 MG tablet Take 1 tablet (20 mg total) by mouth daily. 90 tablet 3  . bimatoprost (LUMIGAN) 0.01 % SOLN Place 1 drop into both eyes at bedtime.    . budesonide (PULMICORT) 180 MCG/ACT   inhaler Inhale 2 puffs into the lungs 2 (two) times daily. 3 each 3  . cetirizine (ZYRTEC) 10 MG tablet Take 1 tablet  (10 mg total) by mouth daily. (Patient taking differently: Take 10 mg by mouth daily as needed. ) 30 tablet 11  . CHOLECALCIFEROL PO Take 1,000 mg by mouth daily. Vitamin D 3    . dorzolamide-timolol (COSOPT) 22.3-6.8 MG/ML ophthalmic solution Place 1 drop into both eyes 2 (two) times daily.     . dronedarone (MULTAQ) 400 MG tablet Take 1 tablet (400 mg total) by mouth 2 (two) times daily with a meal. 180 tablet 3  . finasteride (PROSCAR) 5 MG tablet Take 5 mg by mouth daily.     . fluticasone (FLONASE) 50 MCG/ACT nasal spray Place 2 sprays into both nostrils 2 (two) times daily.    . gabapentin (NEURONTIN) 800 MG tablet Take 800 mg by mouth 3 (three) times daily.    . Multiple Vitamin (MULTIVITAMIN) tablet Take 1 tablet by mouth daily.      . pantoprazole (PROTONIX) 40 MG tablet Take 1 tablet (40 mg total) by mouth daily. 90 tablet 3  . tadalafil (CIALIS) 5 MG tablet Take 5 mg by mouth daily. Reported on 05/26/2016    . triamterene-hydrochlorothiazide (MAXZIDE-25) 37.5-25 MG tablet TAKE ONE TABLET BY MOUTH ONCE DAILY 90 tablet 3   No current facility-administered medications for this visit.     PHYSICAL EXAMINATION: ECOG PERFORMANCE STATUS: 2 - Symptomatic, <50% confined to bed  Vitals:   08/03/16 0814  BP: 131/70  Pulse: 73  Resp: 18  Temp: 98.4 F (36.9 C)   Filed Weights   08/03/16 0814  Weight: 193 lb 9.6 oz (87.8 kg)    GENERAL:alert, no distress and comfortable SKIN: skin color, texture, turgor are normal, no rashes or significant lesions EYES: normal, Conjunctiva are pink and non-injected, sclera clear OROPHARYNX:no exudate, no erythema and lips, buccal mucosa, and tongue normal  NECK: supple, thyroid normal size, non-tender, without nodularity LYMPH:  no palpable lymphadenopathy in the cervical, axillary or inguinal LUNGS: clear to auscultation and percussion with normal breathing effort HEART: regular rate & rhythm and no murmurs with trace lower extremity  edema ABDOMEN:abdomen soft, non-tender and normal bowel sounds Musculoskeletal:no cyanosis of digits and no clubbing  NEURO: alert & oriented x 3 with fluent speech, no focal motor/sensory deficits  LABORATORY DATA:  I have reviewed the data as listed    Component Value Date/Time   NA 141 07/27/2016 0748   K 3.9 07/27/2016 0748   CL 103 05/12/2016 1314   CL 104 03/23/2013 0855   CO2 30 (H) 07/27/2016 0748   GLUCOSE 122 07/27/2016 0748   GLUCOSE 98 03/23/2013 0855   BUN 16.0 07/27/2016 0748   CREATININE 1.0 07/27/2016 0748   CALCIUM 10.5 (H) 07/27/2016 0748   PROT 7.5 07/27/2016 0748   PROT 6.8 07/27/2016 0748   ALBUMIN 4.3 07/27/2016 0748   AST 24 07/27/2016 0748   ALT 19 07/27/2016 0748   ALKPHOS 53 07/27/2016 0748   BILITOT 0.84 07/27/2016 0748   GFRNONAA >60 06/05/2011 0350   GFRAA >60 06/05/2011 0350    No results found for: SPEP, UPEP  Lab Results  Component Value Date   WBC 4.7 07/27/2016   NEUTROABS 2.9 07/27/2016   HGB 14.1 07/27/2016   HCT 40.8 07/27/2016   MCV 88.8 07/27/2016   PLT 178 07/27/2016      Chemistry      Component Value Date/Time     NA 141 07/27/2016 0748   K 3.9 07/27/2016 0748   CL 103 05/12/2016 1314   CL 104 03/23/2013 0855   CO2 30 (H) 07/27/2016 0748   BUN 16.0 07/27/2016 0748   CREATINE 1.0 07/27/2016 0748      Component Value Date/Time   CALCIUM 10.5 (H) 07/27/2016 0748   ALKPHOS 53 07/27/2016 0748   AST 24 07/27/2016 0748   ALT 19 07/27/2016 0748   BILITOT 0.84 07/27/2016 0748     I review his recent echocardiogram ASSESSMENT & PLAN:  MGUS (monoclonal gammopathy of unknown significance) IgM level is within normal limits and M spike is stable. He is not symptomatic apart from his neuropathy I recommend observation only with history, physical examination and blood work in 12 months.  CIDP (chronic inflammatory demyelinating polyneuropathy) This is stable.  He also had polyarthritis including possibility of ankylosing  spondylitis which may respond to rituximab treatment although I have no clinical experience on this. Recent skeletal survey showed he had DISH I would defer his management to his neurologist He was referred to see a rheumatologist in the past  S/P placement of cardiac pacemaker The patient has pacemaker implantation due to bradycardia and history of atrial fibrillation. He is on anticoagulation therapy. I review his recent echocardiogram showed no evidence to suggest amyloidosis. I will defer to his cardiologist for further management   Orders Placed This Encounter  Procedures  . CBC with Differential/Platelet    Standing Status:   Future    Standing Expiration Date:   09/07/2017  . Comprehensive metabolic panel    Standing Status:   Future    Standing Expiration Date:   09/07/2017  . Multiple Myeloma Panel (SPEP&IFE w/QIG)    Standing Status:   Future    Standing Expiration Date:   09/07/2017  . Kappa/lambda light chains    Standing Status:   Future    Standing Expiration Date:   09/07/2017  . Beta 2 microglobulin, serum    Standing Status:   Future    Standing Expiration Date:   09/07/2017   All questions were answered. The patient knows to call the clinic with any problems, questions or concerns. No barriers to learning was detected. I spent 15 minutes counseling the patient face to face. The total time spent in the appointment was 20 minutes and more than 50% was on counseling and review of test results     , , MD 08/03/2016 8:49 AM   

## 2016-08-03 NOTE — Assessment & Plan Note (Signed)
IgM level is within normal limits and M spike is stable. He is not symptomatic apart from his neuropathy I recommend observation only with history, physical examination and blood work in 12 months. 

## 2016-08-04 DIAGNOSIS — G5603 Carpal tunnel syndrome, bilateral upper limbs: Secondary | ICD-10-CM | POA: Diagnosis not present

## 2016-08-04 DIAGNOSIS — M1812 Unilateral primary osteoarthritis of first carpometacarpal joint, left hand: Secondary | ICD-10-CM | POA: Diagnosis not present

## 2016-08-24 ENCOUNTER — Ambulatory Visit (INDEPENDENT_AMBULATORY_CARE_PROVIDER_SITE_OTHER): Payer: Medicare Other

## 2016-08-24 ENCOUNTER — Encounter: Payer: Self-pay | Admitting: Cardiovascular Disease

## 2016-08-24 DIAGNOSIS — Z23 Encounter for immunization: Secondary | ICD-10-CM | POA: Diagnosis not present

## 2016-09-03 ENCOUNTER — Other Ambulatory Visit: Payer: Self-pay | Admitting: Neurology

## 2016-09-06 ENCOUNTER — Other Ambulatory Visit: Payer: Self-pay | Admitting: Neurology

## 2016-09-09 ENCOUNTER — Ambulatory Visit (INDEPENDENT_AMBULATORY_CARE_PROVIDER_SITE_OTHER): Payer: Medicare Other | Admitting: Cardiovascular Disease

## 2016-09-09 ENCOUNTER — Encounter: Payer: Self-pay | Admitting: Cardiovascular Disease

## 2016-09-09 VITALS — BP 120/70 | HR 62 | Ht 70.0 in | Wt 195.0 lb

## 2016-09-09 DIAGNOSIS — I10 Essential (primary) hypertension: Secondary | ICD-10-CM

## 2016-09-09 DIAGNOSIS — I4891 Unspecified atrial fibrillation: Secondary | ICD-10-CM | POA: Diagnosis not present

## 2016-09-09 DIAGNOSIS — E785 Hyperlipidemia, unspecified: Secondary | ICD-10-CM | POA: Diagnosis not present

## 2016-09-09 DIAGNOSIS — I25810 Atherosclerosis of coronary artery bypass graft(s) without angina pectoris: Secondary | ICD-10-CM

## 2016-09-09 NOTE — Patient Instructions (Addendum)
Medication Instructions:  Your physician recommends that you continue on your current medications as directed. Please refer to the Current Medication list given to you today.  Labwork: Your physician recommends that you return for lab work in: 6 MONTHS (lipid and liver), nothing to eat or drink after midnight, lab opens at 7:30 AM  Testing/Procedures: No new orders.   Follow-Up: Your physician wants you to follow-up in: 6 MONTHS with Dr Burt Knack.  You will receive a reminder letter in the mail two months in advance. If you don't receive a letter, please call our office to schedule the follow-up appointment.   Any Other Special Instructions Will Be Listed Below (If Applicable).     If you need a refill on your cardiac medications before your next appointment, please call your pharmacy.

## 2016-09-09 NOTE — Progress Notes (Signed)
Cardiology Office Note Date:  09/09/2016   ID:  Derrick [name], DOB 11-24-1941, MRN [id number]  PCP:  [name], MD  Cardiologist:  [name], MD    Chief Complaint  Patient presents with  . Follow-up    afib   History of Present Illness: Derrick [name] is a 75 y.o. male who presents for follow-up evaluation. He has been followed for coronary artery disease. He has a history of myocardial infarction and drug-eluting stent placement in the right coronary artery. 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 He's been managed medically. He has multiple noncardiac problems including CIDP, pulmonary sarcoid, and monoclonal gammopathy of undetermined significance. He last underwent cardiac catheterization in April 2016 showing stable coronary anatomy.   The patient was found to have slow atrial fibrillation earlier this year. He underwent permanent pacemaker placement in May 2017. He is anticoagulated with Eliquis. He is treated with a rhythm control strategy using Multaq. His CHADS-Vasc score is 3.  He continues to have episodes of upper back pain. These primarily occur at rest and most often at nighttime. He remains physically active and has not had exertional back pain, chest pressure, or shortness of breath. Overall he is feeling better since his pacemaker was inserted. He denies lightheadedness or syncope.   Past Medical History:  Diagnosis Date  . BENIGN PROSTATIC HYPERTROPHY, WITH OBSTRUCTION 05/28/2010  . CAD (coronary artery disease)    a. s/p NSTEMI 06/01/11: DES to RCA;  b. cath 06/25/11:   dLM 50-60% (FFR 0.87), prox to mid LAD 40-50%, D1 50%, pCFX 50%, RCA stent ok, dPDA 80%, EF 55-60%.  His FFR was felt to be negative and therefore medical therapy was recommended ;  echo 6/12: EF 55-60%, mild AS   . Chronic back pain   . CIDP (chronic inflammatory demyelinating polyneuropathy) ([place]) 04/11/2012  . Colon cancer ([place]) dx'd  2000   "left"  . COLONIC POLYPS, ADENOMATOUS, HX OF 05/28/2010  . Complication of anesthesia    "stopped breathing; related to my sleep apnea"   . COPD (chronic obstructive pulmonary disease) ([place])    "associated w/lung sarcoidosis"  . 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 DISH (diffuse idiopathic skeletal hyperostosis) 12/02/2015  . Diverticulitis   . GENERALIZED OSTEOARTHROSIS UNSPECIFIED SITE 05/28/2010  . GERD 05/28/2010  . Heart murmur   . History of gout   . HYPERLIPIDEMIA 05/28/2010  . Hypertension   . IgM lambda paraproteinemia   . 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 Malignant neoplasm of descending colon ([place]) 05/28/2010  . MITRAL VALVE PROLAPSE 05/28/2010  . 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 NSTEMI (non-ST elevated myocardial infarction) ([place]) 06/03/11  . 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  . OSA on CPAP 05/28/2010  . PALPITATIONS, CHRONIC 05/28/2010  . Parotid gland pain 2010   infection  . Peripheral neuropathy ([place])    "tx'd w/targeted chemo" (05/12/2016)  . Pneumonia 2-3 times  . Presence of permanent cardiac pacemaker   . 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 Sarcoidosis ([place])    pulmonalis  . UNSPECIFIED INFLAMMATORY AND TOXIC NEUROPATHY 05/28/2010  . Vision loss   . VITAMIN D DEFICIENCY 05/28/2010    Past Surgical History:  Procedure Laterality Date  . APPENDECTOMY  1954  . CATARACT EXTRACTION W/ INTRAOCULAR LENS IMPLANT Left   . COLON SURGERY  2000   decending colon   . CORONARY ANGIOPLASTY WITH STENT PLACEMENT  06/03/11  .  ELECTROPHYSIOLOGIC STUDY N/A 05/12/2016   Procedure: Cardioversion;  Surgeon: [name], MD;  Location: [facility];  Service: Cardiovascular;  Laterality: N/A;  . EP IMPLANTABLE DEVICE N/A 05/12/2016   Procedure: Pacemaker Implant;  Surgeon: [name], MD;  Location: [facility];  Service: Cardiovascular;  Laterality: N/A;  . INSERT / REPLACE  / REMOVE PACEMAKER  05/12/2016  . KNEE ARTHROSCOPY Right 2003  . LEFT HEART CATHETERIZATION WITH CORONARY ANGIOGRAM N/A 03/31/2015   Procedure: LEFT HEART CATHETERIZATION WITH CORONARY ANGIOGRAM;  Surgeon: [name], MD;  Location: [facility];  Service: Cardiovascular;  Laterality: N/A;  . LUNG SURGERY  2001   "open lung dissection"  . MOHS SURGERY Left ~ 2008   ear  . PROSTATE BIOPSY  2010  . TONSILLECTOMY  1950    Current Outpatient Prescriptions  Medication Sig Dispense Refill  . albuterol (PROVENTIL HFA;VENTOLIN HFA) 108 (90 BASE) MCG/ACT inhaler Inhale 2 puffs into the lungs every 6 (six) hours as needed for wheezing or shortness of breath.    [name] amLODipine (NORVASC) 10 MG tablet Take 1 tablet (10 mg total) by mouth daily. 90 tablet 2  . apixaban (ELIQUIS) 5 MG TABS tablet Take 1 tablet (5 mg total) by mouth 2 (two) times daily. 180 tablet 3  . atorvastatin (LIPITOR) 20 MG tablet Take 1 tablet (20 mg total) by mouth daily. 90 tablet 3  . bimatoprost (LUMIGAN) 0.01 % SOLN Place 1 drop into both eyes at bedtime.    . budesonide (PULMICORT) 180 MCG/ACT inhaler Inhale 2 puffs into the lungs 2 (two) times daily. 3 each 3  . cetirizine (ZYRTEC) 10 MG tablet Take 10 mg by mouth daily as needed for allergies.    . CHOLECALCIFEROL PO Take 1,000 mg by mouth daily. Vitamin D 3    . dorzolamide-timolol (COSOPT) 22.3-6.8 MG/ML ophthalmic solution Place 1 drop into both eyes 2 (two) times daily.     [name] dronedarone (MULTAQ) 400 MG tablet Take 1 tablet (400 mg total) by mouth 2 (two) times daily with a meal. 180 tablet 3  . finasteride (PROSCAR) 5 MG tablet Take 5 mg by mouth daily.     . fluticasone (FLONASE) 50 MCG/ACT nasal spray Place 2 sprays into both nostrils 2 (two) times daily.    [name] gabapentin (NEURONTIN) 800 MG tablet Take 800 mg by mouth 3 (three) times daily.    . Multiple Vitamin (MULTIVITAMIN) tablet Take 1 tablet by mouth daily.      . pantoprazole (PROTONIX) 40 MG tablet Take 1  tablet (40 mg total) by mouth daily. 90 tablet 3  . tadalafil (CIALIS) 5 MG tablet Take 5 mg by mouth daily. Reported on 05/26/2016    . triamterene-hydrochlorothiazide (MAXZIDE-25) 37.5-25 MG tablet TAKE ONE TABLET BY MOUTH ONCE DAILY 90 tablet 3   No current facility-administered medications for this visit.    Allergies:   Review of patient's allergies indicates no known allergies.   Social History:  The patient  reports that he has never smoked. He has never used smokeless tobacco. He reports that he drinks about 10.8 oz of alcohol per week . He reports that he does not use drugs.   Family History:  The patient's  family history includes Arrhythmia (age of onset: 12) in his brother; Arthritis in his mother; Cancer in his brother; Colon polyps in his mother; Coronary artery disease in his brother and mother.    ROS:  Please see the history of present  illness.  Otherwise, review of systems is positive for back pain, easy bruising.  All other systems are reviewed and negative.    PHYSICAL EXAM: VS:  BP 120/70   Pulse 62   Ht 5\' 10"  (1.778 m)   Wt 88.5 kg (195 lb)   SpO2 98%   BMI 27.98 kg/m  , BMI Body mass index is 27.98 kg/m. GEN: Well nourished, well developed, in no acute distress  HEENT: normal  Neck: no JVD, no masses. No carotid bruits Cardiac: RRR with 2/6 systolic ejection murmur at the RUSB             Respiratory:  clear to auscultation bilaterally, normal work of breathing GI: soft, nontender, nondistended, + BS MS: no deformity or atrophy  Ext: no pretibial edema, pedal pulses 2+= bilaterally Skin: warm and dry, no rash Neuro:  Strength and sensation are intact Psych: euthymic mood, full affect  EKG:  EKG is not ordered today.  Recent Labs: 02/09/2016: TSH 2.47 07/27/2016: ALT 19; BUN 16.0; Creatinine 1.0; HGB 14.1; Platelets 178; Potassium 3.9; Sodium 141   Lipid Panel     Component Value Date/Time   CHOL 139 03/19/2016 0922   TRIG 70 03/19/2016 0922   HDL 51  03/19/2016 0922   CHOLHDL 2.7 03/19/2016 0922   VLDL 14 03/19/2016 0922   LDLCALC 74 03/19/2016 0922      Wt Readings from Last 3 Encounters:  09/09/16 88.5 kg (195 lb)  08/03/16 87.8 kg (193 lb 9.6 oz)  08/02/16 87.8 kg (193 lb 8 oz)     Cardiac Studies Reviewed: 2D Echo: Study Conclusions  - Left ventricle: The cavity size was normal. Wall thickness was   normal. Systolic function was vigorous. The estimated ejection   fraction was in the range of 65% to 70%. Wall motion was normal;   there were no regional wall motion abnormalities. The study is   not technically sufficient to allow evaluation of LV diastolic   function. - Aortic valve: Trileaflet; mildly calcified leaflets. Mild   stenosis. Mean gradient (S): 12 mm Hg. Peak gradient (S): 22 mm   Hg. Valve area (VTI): 2.58 cm^2. Valve area (Vmax): 2.92 cm^2.   Valve area (Vmean): 2.91 cm^2. - Aorta: Ascending aortic diameter: 41 mm (S). - Ascending aorta: The ascending aorta is dilated. - Mitral valve: Mildly thickened leaflets . There was mild   regurgitation. - Left atrium: Severely dilated at 57 ml/m2. - Right ventricle: The cavity size was mildly dilated. - Right atrium: Severely dilated at 27 cm2. - Tricuspid valve: There was mild regurgitation. - Pulmonary arteries: PA peak pressure: 35 mm Hg (S). - Inferior vena cava: The vessel was dilated. The respirophasic   diameter changes were blunted (< 50%), consistent with elevated   central venous pressure.  Impressions:  - LVEF 65-70%, normal wall thickness and motion, calcified aortic   valve with mild stenosis, dilated ascending aorta to 4.1 cm, mild   to moderate MR, severe biatrial enlargement, mild TR, RVSP 35   mmHg, dilated IVC.  Cardiac Catheterization 03-31-2015: Procedural Findings: Hemodynamics: AO 143/64 LV 142/11  Coronary angiography: Coronary dominance: right  Left mainstem: The left mainstem is patent. The proximal and mid left main have  no significant obstruction. The distal left main has 60% stenosis without significant change from previous studies. The vessel divides into the LAD and left circumflex.  Left anterior descending (LAD): The LAD is heavily calcified. The proximal LAD has 60% heavy calcific stenosis. This  involves the ostium of the vessel. The diagonal branches are small to medium in caliber. The ostium of the first diagonal has 80% stenosis. The second and third diagonals have no significant disease. The mid LAD is diffusely diseased with 50% stenosis. The LAD wraps the LV apex.  Left circumflex (LCx): The left circumflex is also calcified. The vessel begins with an acute angulation from the left main. The OM branches are patent. The proximal circumflex has 40-50% stenosis.  Right coronary artery (RCA): The RCA is dominant. The vessel is moderately calcified. The stented segment through the proximal and mid vessel is widely patent without significant stenosis. The ostium of the RCA has 20-30% stenosis. The distal RCA is diffusely diseased with 30-40% stenosis. The PLA branch is patent. The PDA branch has a 70% stenosis in its midportion. The vessel is fairly small through this region.  Left ventriculography: Left ventricular systolic function is normal, LVEF is estimated at 55-65%, there is no significant mitral regurgitation   Contrast: 70 cc Omnipaque  Estimated Blood Loss: Minimal  Final Conclusions:   1. Moderate distal left mainstem disease, without significant progression from the patient's 2012 cardiac catheterization study 2. Mild to moderate diffusely calcified stenosis of the LAD 3. Mild nonobstructive stenosis of the left circumflex 4. Continued stent patency of the RCA with moderate PDA stenosis 5. Preserved LV systolic function  Recommendations: I have carefully reviewed the patients cardiac catheterization study and compared it to his previous study from 2012. While his stress test was  abnormal, he is not limited by symptoms of angina. I would recommend ongoing medical therapy as his only other option for treatment would be multivessel CABG. Certainly, if he has progressive symptoms, cardiac surgery could be considered  ASSESSMENT AND PLAN: 1.  Paroxysmal atrial fibrillation: recent office note of Dr Curt Bears reviewed. Continue Eliquis and Multaq.   This patients CHA2DS2-VASc Score and unadjusted Ischemic Stroke Rate (% per year) is equal to 3.2 % stroke rate/year from a score of 3  Above score calculated as 1 point each if present [CHF, HTN, DM, Vascular=MI/PAD/Aortic Plaque, Age if 65-74, or Male] Above score calculated as 2 points each if present [Age > 75, or Stroke/TIA/TE]  2. S/P PPM - followed by Dr Curt Bears.   3. CAD, native vessel, without angina: continue medical management of CAD. We discussed further surveillance of his known left main disease. He had cardiac catheterization last year demonstrating stability of his coronary anatomy. He has concerns about his upper back pain and whether that might represent an anginal equivalent. However, he is physically active and this is never associated with exertion. I've recommended we continue to treat him medically and will consider stress testing if he develops exertional symptoms or other concerns.  4. Hyperlipidemia: Treated with atorvastatin. Most recent lipids reviewed. Will order a follow-up lipid panel prior to his office visit in 6 months.  5. Valvular heart disease: mild AS and mild MR. Continue with clinical FU. Most recent echo reviewed.  Current medicines are reviewed with the patient today.  The patient does not have concerns regarding medicines.  Labs/ tests ordered today include:   Orders Placed This Encounter  Procedures  . Lipid panel  . Hepatic function panel   Disposition:   FU 6 months  Signed, [name], MD  09/09/2016 1:05 PM    [facility] [place],  [place], [place]  [address] Phone: [phone]; Fax: [phone]

## 2016-09-20 DIAGNOSIS — W57XXXA Bitten or stung by nonvenomous insect and other nonvenomous arthropods, initial encounter: Secondary | ICD-10-CM | POA: Diagnosis not present

## 2016-09-20 DIAGNOSIS — S30860A Insect bite (nonvenomous) of lower back and pelvis, initial encounter: Secondary | ICD-10-CM | POA: Diagnosis not present

## 2016-09-20 DIAGNOSIS — L814 Other melanin hyperpigmentation: Secondary | ICD-10-CM | POA: Diagnosis not present

## 2016-09-20 DIAGNOSIS — Z85828 Personal history of other malignant neoplasm of skin: Secondary | ICD-10-CM | POA: Diagnosis not present

## 2016-09-20 DIAGNOSIS — D1800 Hemangioma unspecified site: Secondary | ICD-10-CM | POA: Diagnosis not present

## 2016-09-20 DIAGNOSIS — L821 Other seborrheic keratosis: Secondary | ICD-10-CM | POA: Diagnosis not present

## 2016-09-20 DIAGNOSIS — D225 Melanocytic nevi of trunk: Secondary | ICD-10-CM | POA: Diagnosis not present

## 2016-10-07 DIAGNOSIS — R972 Elevated prostate specific antigen [PSA]: Secondary | ICD-10-CM | POA: Diagnosis not present

## 2016-10-07 LAB — PSA: PSA: 3.14

## 2016-10-29 DIAGNOSIS — R3912 Poor urinary stream: Secondary | ICD-10-CM | POA: Diagnosis not present

## 2016-10-29 DIAGNOSIS — N401 Enlarged prostate with lower urinary tract symptoms: Secondary | ICD-10-CM | POA: Diagnosis not present

## 2016-10-29 DIAGNOSIS — R972 Elevated prostate specific antigen [PSA]: Secondary | ICD-10-CM | POA: Diagnosis not present

## 2016-11-01 ENCOUNTER — Ambulatory Visit (INDEPENDENT_AMBULATORY_CARE_PROVIDER_SITE_OTHER): Payer: Medicare Other | Admitting: *Deleted

## 2016-11-01 DIAGNOSIS — I4891 Unspecified atrial fibrillation: Secondary | ICD-10-CM

## 2016-11-01 NOTE — Progress Notes (Signed)
Remote pacemaker transmission.   

## 2016-11-03 ENCOUNTER — Encounter: Payer: Self-pay | Admitting: Cardiology

## 2016-11-10 ENCOUNTER — Encounter: Payer: Self-pay | Admitting: Internal Medicine

## 2016-11-10 DIAGNOSIS — M19049 Primary osteoarthritis, unspecified hand: Secondary | ICD-10-CM | POA: Diagnosis not present

## 2016-11-10 DIAGNOSIS — M1812 Unilateral primary osteoarthritis of first carpometacarpal joint, left hand: Secondary | ICD-10-CM | POA: Diagnosis not present

## 2016-11-10 DIAGNOSIS — G5602 Carpal tunnel syndrome, left upper limb: Secondary | ICD-10-CM | POA: Diagnosis not present

## 2016-11-10 DIAGNOSIS — G5601 Carpal tunnel syndrome, right upper limb: Secondary | ICD-10-CM | POA: Diagnosis not present

## 2016-11-10 LAB — CUP PACEART REMOTE DEVICE CHECK
Battery Remaining Longevity: 94 mo
Battery Voltage: 3.01 V
Brady Statistic AP VP Percent: 97.35 %
Brady Statistic AP VS Percent: 0.01 %
Brady Statistic AS VP Percent: 2.64 %
Brady Statistic AS VS Percent: 0 %
Brady Statistic RA Percent Paced: 97.36 %
Brady Statistic RV Percent Paced: 99.99 %
Date Time Interrogation Session: 20171106153124
Implantable Lead Implant Date: 20170517
Implantable Lead Implant Date: 20170517
Implantable Lead Location: 753859
Implantable Lead Location: 753860
Implantable Lead Model: 5076
Implantable Lead Model: 5076
Implantable Pulse Generator Implant Date: 20170517
Lead Channel Impedance Value: 380 Ohm
Lead Channel Impedance Value: 456 Ohm
Lead Channel Impedance Value: 494 Ohm
Lead Channel Impedance Value: 627 Ohm
Lead Channel Pacing Threshold Amplitude: 0.75 V
Lead Channel Pacing Threshold Amplitude: 0.875 V
Lead Channel Pacing Threshold Pulse Width: 0.4 ms
Lead Channel Pacing Threshold Pulse Width: 0.4 ms
Lead Channel Sensing Intrinsic Amplitude: 11.5 mV
Lead Channel Sensing Intrinsic Amplitude: 11.5 mV
Lead Channel Sensing Intrinsic Amplitude: 2.75 mV
Lead Channel Sensing Intrinsic Amplitude: 2.75 mV
Lead Channel Setting Pacing Amplitude: 2 V
Lead Channel Setting Pacing Amplitude: 2.5 V
Lead Channel Setting Pacing Pulse Width: 0.4 ms
Lead Channel Setting Sensing Sensitivity: 2.8 mV

## 2016-12-07 DIAGNOSIS — M150 Primary generalized (osteo)arthritis: Secondary | ICD-10-CM | POA: Diagnosis not present

## 2016-12-07 DIAGNOSIS — M5489 Other dorsalgia: Secondary | ICD-10-CM | POA: Diagnosis not present

## 2016-12-07 DIAGNOSIS — Z6829 Body mass index (BMI) 29.0-29.9, adult: Secondary | ICD-10-CM | POA: Diagnosis not present

## 2016-12-07 DIAGNOSIS — M25561 Pain in right knee: Secondary | ICD-10-CM | POA: Diagnosis not present

## 2016-12-07 DIAGNOSIS — E663 Overweight: Secondary | ICD-10-CM | POA: Diagnosis not present

## 2017-01-06 ENCOUNTER — Telehealth: Payer: Self-pay | Admitting: Cardiovascular Disease

## 2017-01-06 NOTE — Telephone Encounter (Signed)
New Message     Time for his follow up appt with Dr Burt Knack can not get appointment, wants to talk to Dr Burt Knack or Ander Purpura

## 2017-01-06 NOTE — Telephone Encounter (Signed)
Pt due for lipid and liver profile and office visit in March. I spoke with the pt and due to no open slots on MD schedule I will place the pt on Dr Antionette Char wait list. I advised the pt that the office will contact him with appointments once schedules are available. Pt agreed with plan.

## 2017-01-11 DIAGNOSIS — M150 Primary generalized (osteo)arthritis: Secondary | ICD-10-CM | POA: Diagnosis not present

## 2017-01-11 DIAGNOSIS — M5489 Other dorsalgia: Secondary | ICD-10-CM | POA: Diagnosis not present

## 2017-01-11 DIAGNOSIS — M545 Low back pain: Secondary | ICD-10-CM | POA: Diagnosis not present

## 2017-01-11 DIAGNOSIS — M546 Pain in thoracic spine: Secondary | ICD-10-CM | POA: Diagnosis not present

## 2017-01-11 DIAGNOSIS — M25561 Pain in right knee: Secondary | ICD-10-CM | POA: Diagnosis not present

## 2017-01-25 DIAGNOSIS — H401112 Primary open-angle glaucoma, right eye, moderate stage: Secondary | ICD-10-CM | POA: Diagnosis not present

## 2017-01-25 DIAGNOSIS — H401122 Primary open-angle glaucoma, left eye, moderate stage: Secondary | ICD-10-CM | POA: Diagnosis not present

## 2017-01-27 DIAGNOSIS — M5489 Other dorsalgia: Secondary | ICD-10-CM | POA: Diagnosis not present

## 2017-01-27 DIAGNOSIS — M546 Pain in thoracic spine: Secondary | ICD-10-CM | POA: Diagnosis not present

## 2017-01-27 DIAGNOSIS — M150 Primary generalized (osteo)arthritis: Secondary | ICD-10-CM | POA: Diagnosis not present

## 2017-01-27 DIAGNOSIS — M545 Low back pain: Secondary | ICD-10-CM | POA: Diagnosis not present

## 2017-01-27 DIAGNOSIS — M25561 Pain in right knee: Secondary | ICD-10-CM | POA: Diagnosis not present

## 2017-01-31 ENCOUNTER — Encounter: Payer: Self-pay | Admitting: Cardiology

## 2017-01-31 ENCOUNTER — Ambulatory Visit (INDEPENDENT_AMBULATORY_CARE_PROVIDER_SITE_OTHER): Payer: Medicare Other | Admitting: *Deleted

## 2017-01-31 DIAGNOSIS — I4891 Unspecified atrial fibrillation: Secondary | ICD-10-CM

## 2017-01-31 NOTE — Progress Notes (Signed)
Remote pacemaker transmission.   

## 2017-02-01 LAB — CUP PACEART REMOTE DEVICE CHECK
Battery Remaining Longevity: 97 mo
Battery Voltage: 3.01 V
Brady Statistic AP VP Percent: 98.23 %
Brady Statistic AP VS Percent: 0.01 %
Brady Statistic AS VP Percent: 1.76 %
Brady Statistic AS VS Percent: 0 %
Brady Statistic RA Percent Paced: 98.21 %
Brady Statistic RV Percent Paced: 99.94 %
Date Time Interrogation Session: 20180205153227
Implantable Lead Implant Date: 20170517
Implantable Lead Implant Date: 20170517
Implantable Lead Location: 753859
Implantable Lead Location: 753860
Implantable Lead Model: 5076
Implantable Lead Model: 5076
Implantable Pulse Generator Implant Date: 20170517
Lead Channel Impedance Value: 399 Ohm
Lead Channel Impedance Value: 475 Ohm
Lead Channel Impedance Value: 494 Ohm
Lead Channel Impedance Value: 608 Ohm
Lead Channel Pacing Threshold Amplitude: 0.875 V
Lead Channel Pacing Threshold Amplitude: 0.875 V
Lead Channel Pacing Threshold Pulse Width: 0.4 ms
Lead Channel Pacing Threshold Pulse Width: 0.4 ms
Lead Channel Sensing Intrinsic Amplitude: 10.5 mV
Lead Channel Sensing Intrinsic Amplitude: 10.5 mV
Lead Channel Sensing Intrinsic Amplitude: 2.375 mV
Lead Channel Sensing Intrinsic Amplitude: 2.375 mV
Lead Channel Setting Pacing Amplitude: 1.75 V
Lead Channel Setting Pacing Amplitude: 2.5 V
Lead Channel Setting Pacing Pulse Width: 0.4 ms
Lead Channel Setting Sensing Sensitivity: 2.8 mV

## 2017-02-02 ENCOUNTER — Encounter: Payer: Self-pay | Admitting: Cardiology

## 2017-02-15 DIAGNOSIS — M25561 Pain in right knee: Secondary | ICD-10-CM | POA: Diagnosis not present

## 2017-02-15 DIAGNOSIS — M150 Primary generalized (osteo)arthritis: Secondary | ICD-10-CM | POA: Diagnosis not present

## 2017-02-15 DIAGNOSIS — M545 Low back pain: Secondary | ICD-10-CM | POA: Diagnosis not present

## 2017-02-15 DIAGNOSIS — M5489 Other dorsalgia: Secondary | ICD-10-CM | POA: Diagnosis not present

## 2017-02-15 DIAGNOSIS — M546 Pain in thoracic spine: Secondary | ICD-10-CM | POA: Diagnosis not present

## 2017-02-21 ENCOUNTER — Other Ambulatory Visit: Payer: Self-pay | Admitting: Cardiovascular Disease

## 2017-02-21 ENCOUNTER — Other Ambulatory Visit: Payer: Self-pay | Admitting: Internal Medicine

## 2017-02-21 DIAGNOSIS — I4891 Unspecified atrial fibrillation: Secondary | ICD-10-CM

## 2017-02-21 NOTE — Telephone Encounter (Signed)
He needs to be seen within the next 30 days

## 2017-02-23 ENCOUNTER — Telehealth: Payer: Self-pay | Admitting: Internal Medicine

## 2017-02-23 NOTE — Telephone Encounter (Signed)
Pt called requesting a 90 day supply for amlodipine and triamterene-hctz due to traveling. We sent in 30 day supply on 02/21/17

## 2017-02-24 ENCOUNTER — Ambulatory Visit: Payer: Medicare Other | Admitting: Internal Medicine

## 2017-02-24 DIAGNOSIS — M5489 Other dorsalgia: Secondary | ICD-10-CM | POA: Diagnosis not present

## 2017-02-24 DIAGNOSIS — M546 Pain in thoracic spine: Secondary | ICD-10-CM | POA: Diagnosis not present

## 2017-02-24 DIAGNOSIS — M25561 Pain in right knee: Secondary | ICD-10-CM | POA: Diagnosis not present

## 2017-02-24 DIAGNOSIS — M545 Low back pain: Secondary | ICD-10-CM | POA: Diagnosis not present

## 2017-02-24 DIAGNOSIS — M150 Primary generalized (osteo)arthritis: Secondary | ICD-10-CM | POA: Diagnosis not present

## 2017-02-25 NOTE — Telephone Encounter (Signed)
Per PCP - pt needs to be seen in the next 30 days/

## 2017-02-25 NOTE — Telephone Encounter (Signed)
Pt will have an appt on 03/08/2017 and we can send in refills at that time.

## 2017-02-25 NOTE — Telephone Encounter (Signed)
Pt informed. Pt is not happy with the decision to not fill for 90 days.   Explained to pt that we need to see him first. Pt wants to drop [place] and states that he has been on the medications for 20 years and doesn't understand why we would refuse.   Forwarding to PCP as Juluis Rainier unless there is any recommendations.

## 2017-03-01 ENCOUNTER — Ambulatory Visit: Payer: Medicare Other | Admitting: Internal Medicine

## 2017-03-07 DIAGNOSIS — M1812 Unilateral primary osteoarthritis of first carpometacarpal joint, left hand: Secondary | ICD-10-CM | POA: Diagnosis not present

## 2017-03-07 NOTE — Telephone Encounter (Signed)
Had to reschedule patients appointment due to weather/ office closing.  Patient has rescheduled appt for April.  Is requesting medication to be refilled.

## 2017-03-08 ENCOUNTER — Ambulatory Visit: Payer: Medicare Other | Admitting: Internal Medicine

## 2017-03-08 DIAGNOSIS — M5489 Other dorsalgia: Secondary | ICD-10-CM | POA: Diagnosis not present

## 2017-03-08 DIAGNOSIS — M25561 Pain in right knee: Secondary | ICD-10-CM | POA: Diagnosis not present

## 2017-03-08 DIAGNOSIS — M545 Low back pain: Secondary | ICD-10-CM | POA: Diagnosis not present

## 2017-03-08 DIAGNOSIS — M546 Pain in thoracic spine: Secondary | ICD-10-CM | POA: Diagnosis not present

## 2017-03-08 DIAGNOSIS — M150 Primary generalized (osteo)arthritis: Secondary | ICD-10-CM | POA: Diagnosis not present

## 2017-03-08 MED ORDER — TRIAMTERENE-HCTZ 37.5-25 MG PO TABS: 1.0000 | ORAL_TABLET | Freq: Every day | ORAL | 0 refills | Status: DC

## 2017-03-08 MED ORDER — AMLODIPINE BESYLATE 10 MG PO TABS: 10.0000 mg | ORAL_TABLET | Freq: Every day | ORAL | 0 refills | Status: DC

## 2017-03-08 NOTE — Addendum Note (Signed)
Addended by: Aviva Signs M on: 03/08/2017 12:48 PM   Modules accepted: Orders

## 2017-03-08 NOTE — Telephone Encounter (Signed)
erx sent in for 90 days

## 2017-03-28 ENCOUNTER — Ambulatory Visit (INDEPENDENT_AMBULATORY_CARE_PROVIDER_SITE_OTHER): Payer: Medicare Other | Admitting: Cardiology

## 2017-03-28 ENCOUNTER — Encounter: Payer: Self-pay | Admitting: Cardiology

## 2017-03-28 VITALS — BP 120/80 | HR 70 | Ht 70.0 in | Wt 200.2 lb

## 2017-03-28 DIAGNOSIS — I481 Persistent atrial fibrillation: Secondary | ICD-10-CM | POA: Diagnosis not present

## 2017-03-28 DIAGNOSIS — R001 Bradycardia, unspecified: Secondary | ICD-10-CM | POA: Diagnosis not present

## 2017-03-28 DIAGNOSIS — M545 Low back pain: Secondary | ICD-10-CM | POA: Diagnosis not present

## 2017-03-28 DIAGNOSIS — M25561 Pain in right knee: Secondary | ICD-10-CM | POA: Diagnosis not present

## 2017-03-28 DIAGNOSIS — M150 Primary generalized (osteo)arthritis: Secondary | ICD-10-CM | POA: Diagnosis not present

## 2017-03-28 DIAGNOSIS — M546 Pain in thoracic spine: Secondary | ICD-10-CM | POA: Diagnosis not present

## 2017-03-28 DIAGNOSIS — M5489 Other dorsalgia: Secondary | ICD-10-CM | POA: Diagnosis not present

## 2017-03-28 DIAGNOSIS — I4819 Other persistent atrial fibrillation: Secondary | ICD-10-CM

## 2017-03-28 LAB — CUP PACEART INCLINIC DEVICE CHECK
Battery Remaining Longevity: 97 mo
Battery Voltage: 3.01 V
Brady Statistic AP VP Percent: 97.91 %
Brady Statistic AP VS Percent: 0.01 %
Brady Statistic AS VP Percent: 2.07 %
Brady Statistic AS VS Percent: 0 %
Brady Statistic RA Percent Paced: 97.86 %
Brady Statistic RV Percent Paced: 99.87 %
Date Time Interrogation Session: 20180402101907
Implantable Lead Implant Date: 20170517
Implantable Lead Implant Date: 20170517
Implantable Lead Location: 753859
Implantable Lead Location: 753860
Implantable Lead Model: 5076
Implantable Lead Model: 5076
Implantable Pulse Generator Implant Date: 20170517
Lead Channel Impedance Value: 380 Ohm
Lead Channel Impedance Value: 494 Ohm
Lead Channel Impedance Value: 513 Ohm
Lead Channel Impedance Value: 627 Ohm
Lead Channel Pacing Threshold Amplitude: 0.75 V
Lead Channel Pacing Threshold Amplitude: 1 V
Lead Channel Pacing Threshold Pulse Width: 0.4 ms
Lead Channel Pacing Threshold Pulse Width: 0.4 ms
Lead Channel Sensing Intrinsic Amplitude: 12.625 mV
Lead Channel Sensing Intrinsic Amplitude: 3.125 mV
Lead Channel Setting Pacing Amplitude: 1.75 V
Lead Channel Setting Pacing Amplitude: 2.5 V
Lead Channel Setting Pacing Pulse Width: 0.4 ms
Lead Channel Setting Sensing Sensitivity: 2.8 mV

## 2017-03-28 NOTE — Patient Instructions (Addendum)
Medication Instructions:    Your physician recommends that you continue on your current medications as directed. Please refer to the Current Medication list given to you today.  --- If you need a refill on your cardiac medications before your next appointment, please call your pharmacy. ---  Labwork:  None ordered  Testing/Procedures:  None ordered  Follow-Up: Remote monitoring is used to monitor your Pacemaker of ICD from home. This monitoring reduces the number of office visits required to check your device to one time per year. It allows [place] to keep an eye on the functioning of your device to ensure it is working properly. You are scheduled for a device check from home on 06/27/2017. You may send your transmission at any time that day. If you have a wireless device, the transmission will be sent automatically. After your physician reviews your transmission, you will receive a postcard with your next transmission date.   Your physician wants you to follow-up in: 6 months with Dr. Curt Bears.  You will receive a reminder letter in the mail two months in advance. If you don't receive a letter, please call our office to schedule the follow-up appointment.  Thank you for choosing [facility]!!   [place] Curet, RN [phone]

## 2017-03-28 NOTE — Progress Notes (Signed)
Electrophysiology Office Note   Date:  03/28/2017   ID:  Derrick [name], DOB 05/05/1941, MRN [id number]  PCP:  Derrick Calico, MD  Cardiologist:  Derrick [name] Primary Electrophysiologist:  Derrick Weissinger Derrick Leeds, MD    Chief Complaint  Patient presents with  . Atrial Fibrillation  . Pacemaker Check     History of Present Illness: Derrick [name] is a 76 y.o. male who presents today for electrophysiology evaluation.   He has been followed for coronary artery disease. He has a history of myocardial infarction and drug-eluting stent placement in the right coronary artery. 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 He's been managed medically. He has multiple noncardiac problems including CIDP, pulmonary sarcoid, and monoclonal gammopathy of undetermined significance. He last underwent cardiac catheterization in April 2016 showing stable coronary anatomy.  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   Today, he denies symptoms of chest pain, orthopnea, PND, lower extremity edema, claudication, dizziness, presyncope, syncope, bleeding, or neurologic sequela. The patient is tolerating medications without difficulties.  He does have sleep apnea, and wears a CPAP.  He is feeling well with a few episodes of fatigue. His device interrogation shows that he has been in sinus rhythm for the vast majority of the time since his pacemaker was implanted. He is of note having trouble affording his Multaq, as it is costing him $2200 for 90 days.   Past Medical History:  Diagnosis Date  . BENIGN PROSTATIC HYPERTROPHY, WITH OBSTRUCTION 05/28/2010  . CAD (coronary artery disease)    a. s/p NSTEMI 06/01/11: DES to RCA;  b. cath 06/25/11:   dLM 50-60% (FFR 0.87), prox to mid LAD 40-50%, D1 50%, pCFX 50%, RCA stent ok, dPDA 80%, EF 55-60%.  His FFR was felt to be negative and therefore medical therapy was recommended ;  echo 6/12: EF 55-60%, mild AS   .  Chronic back pain   . CIDP (chronic inflammatory demyelinating polyneuropathy) ([place]) 04/11/2012  . Colon cancer ([place]) dx'd 2000   "left"  . COLONIC POLYPS, ADENOMATOUS, HX OF 05/28/2010  . Complication of anesthesia    "stopped breathing; related to my sleep apnea"   . COPD (chronic obstructive pulmonary disease) ([place])    "associated w/lung sarcoidosis"  . 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 DISH (diffuse idiopathic skeletal hyperostosis) 12/02/2015  . Diverticulitis   . GENERALIZED OSTEOARTHROSIS UNSPECIFIED SITE 05/28/2010  . GERD 05/28/2010  . Heart murmur   . History of gout   . HYPERLIPIDEMIA 05/28/2010  . Hypertension   . IgM lambda paraproteinemia   . 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 Malignant neoplasm of descending colon ([place]) 05/28/2010  . MITRAL VALVE PROLAPSE 05/28/2010  . 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 NSTEMI (non-ST elevated myocardial infarction) ([place]) 06/03/11  . 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  . OSA on CPAP 05/28/2010  . PALPITATIONS, CHRONIC 05/28/2010  . Parotid gland pain 2010   infection  . Peripheral neuropathy ([place])    "tx'd w/targeted chemo" (05/12/2016)  . Pneumonia 2-3 times  . Presence of permanent cardiac pacemaker   . 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 Sarcoidosis ([place])    pulmonalis  . UNSPECIFIED INFLAMMATORY AND TOXIC NEUROPATHY 05/28/2010  . Vision loss   . VITAMIN D DEFICIENCY 05/28/2010   Past Surgical History:  Procedure Laterality Date  . APPENDECTOMY  1954  . CATARACT EXTRACTION W/ INTRAOCULAR LENS IMPLANT Left   . COLON  SURGERY  2000   decending colon   . CORONARY ANGIOPLASTY WITH STENT PLACEMENT  06/03/11  . ELECTROPHYSIOLOGIC STUDY N/A 05/12/2016   Procedure: Cardioversion;  Surgeon: Derrick Murillo Derrick Leeds, MD;  Location: [facility];  Service: Cardiovascular;  Laterality: N/A;  . EP IMPLANTABLE DEVICE N/A 05/12/2016   Procedure: Pacemaker Implant;  Surgeon:  Derrick Donelan Derrick Leeds, MD;  Location: [facility];  Service: Cardiovascular;  Laterality: N/A;  . INSERT / REPLACE / REMOVE PACEMAKER  05/12/2016  . KNEE ARTHROSCOPY Right 2003  . LEFT HEART CATHETERIZATION WITH CORONARY ANGIOGRAM N/A 03/31/2015   Procedure: LEFT HEART CATHETERIZATION WITH CORONARY ANGIOGRAM;  Surgeon: Derrick Mocha, MD;  Location: [facility];  Service: Cardiovascular;  Laterality: N/A;  . LUNG SURGERY  2001   "open lung dissection"  . MOHS SURGERY Left ~ 2008   ear  . PROSTATE BIOPSY  2010  . TONSILLECTOMY  1950     Current Outpatient Prescriptions  Medication Sig Dispense Refill  . albuterol (PROVENTIL HFA;VENTOLIN HFA) 108 (90 BASE) MCG/ACT inhaler Inhale 2 puffs into the lungs every 6 (six) hours as needed for wheezing or shortness of breath.    [name] amLODipine (NORVASC) 10 MG tablet Take 1 tablet (10 mg total) by mouth daily. 90 tablet 0  . atorvastatin (LIPITOR) 20 MG tablet TAKE ONE TABLET BY MOUTH ONCE DAILY 90 tablet 1  . bimatoprost (LUMIGAN) 0.01 % SOLN Place 1 drop into both eyes at bedtime.    . budesonide (PULMICORT) 180 MCG/ACT inhaler Inhale 2 puffs into the lungs 2 (two) times daily. 3 each 3  . cetirizine (ZYRTEC) 10 MG tablet Take 10 mg by mouth daily as needed for allergies.    . CHOLECALCIFEROL PO Take 1,000 mg by mouth daily. Vitamin D 3    . dorzolamide-timolol (COSOPT) 22.3-6.8 MG/ML ophthalmic solution Place 1 drop into both eyes 2 (two) times daily.     [name] dronedarone (MULTAQ) 400 MG tablet Take 1 tablet (400 mg total) by mouth 2 (two) times daily with a meal. 180 tablet 3  . ELIQUIS 5 MG TABS tablet TAKE ONE TABLET BY MOUTH TWICE DAILY 180 tablet 1  . finasteride (PROSCAR) 5 MG tablet Take 5 mg by mouth daily.     . fluticasone (FLONASE) 50 MCG/ACT nasal spray Place 2 sprays into both nostrils 2 (two) times daily.    [name] gabapentin (NEURONTIN) 800 MG tablet Take 800 mg by mouth 3 (three) times daily.    . Multiple Vitamin (MULTIVITAMIN) tablet  Take 1 tablet by mouth daily.      . pantoprazole (PROTONIX) 40 MG tablet TAKE ONE TABLET BY MOUTH ONCE DAILY 90 tablet 1  . tadalafil (CIALIS) 5 MG tablet Take 5 mg by mouth daily. Reported on 05/26/2016    . triamterene-hydrochlorothiazide (MAXZIDE-25) 37.5-25 MG tablet Take 1 tablet by mouth daily. 90 tablet 0   No current facility-administered medications for this visit.     Allergies:   Patient has no known allergies.   Social History:  The patient  reports that he has never smoked. He has never used smokeless tobacco. He reports that he drinks about 10.8 oz of alcohol per week . He reports that he does not use drugs.   Family History:  The patient's family history includes Arrhythmia (age of onset: 49) in his brother; Arthritis in his mother; Cancer in his brother; Colon polyps in his mother; Coronary artery disease in his brother and mother.    ROS:  Please see the history of present illness.   Otherwise, review of systems is positive for fatigue, leg swelling.   All other systems are reviewed and negative.    PHYSICAL EXAM: VS:  BP 120/80 (BP Location: Left Arm, Patient Position: Sitting, Cuff Size: Normal)   Pulse 70   Ht 5\' 10"  (1.778 m)   Wt 200 lb 3.2 oz (90.8 kg)   SpO2 96%   BMI 28.73 kg/m  , BMI Body mass index is 28.73 kg/m. GEN: Well nourished, well developed, in no acute distress  HEENT: normal  Neck: no JVD, carotid bruits, or masses Cardiac: RRR; no murmurs, rubs, or gallops,no edema  Respiratory:  clear to auscultation bilaterally, normal work of breathing GI: soft, nontender, nondistended, + BS MS: no deformity or atrophy  Skin: warm and dry Neuro:  Strength and sensation are intact Psych: euthymic mood, full affect  EKG:  EKG is ordered today. Personal review of the ekg ordered today shows AV paced  Recent Labs: 07/27/2016: ALT 19; BUN 16.0; Creatinine 1.0; HGB 14.1; Platelets 178; Potassium 3.9; Sodium 141    Lipid Panel     Component Value  Date/Time   CHOL 139 03/19/2016 0922   TRIG 70 03/19/2016 0922   HDL 51 03/19/2016 0922   CHOLHDL 2.7 03/19/2016 0922   VLDL 14 03/19/2016 0922   LDLCALC 74 03/19/2016 0922     Wt Readings from Last 3 Encounters:  03/28/17 200 lb 3.2 oz (90.8 kg)  09/09/16 195 lb (88.5 kg)  08/03/16 193 lb 9.6 oz (87.8 kg)      Other studies Reviewed: Additional studies/ records that were reviewed today include: 03/31/15 Cath, 04/13/16 TTE, 03/31/16 Holter Review of the above records today demonstrates:   Final Conclusions:  1. Moderate distal left mainstem disease, without significant progression from the patient's 2012 cardiac catheterization study 2. Mild to moderate diffusely calcified stenosis of the LAD 3. Mild nonobstructive stenosis of the left circumflex 4. Continued stent patency of the RCA with moderate PDA stenosis 5. Preserved LV systolic function  - Left ventricle: The cavity size was normal. Wall thickness was  normal. Systolic function was vigorous. The estimated ejection  fraction was in the range of 65% to 70%. Wall motion was normal;  there were no regional wall motion abnormalities. The study is  not technically sufficient to allow evaluation of LV diastolic  function. - Aortic valve: Trileaflet; mildly calcified leaflets. Mild  stenosis. Mean gradient (S): 12 mm Hg. Peak gradient (S): 22 mm  Hg. Valve area (VTI): 2.58 cm^2. Valve area (Vmax): 2.92 cm^2.  Valve area (Vmean): 2.91 cm^2. - Aorta: Ascending aortic diameter: 41 mm (S). - Ascending aorta: The ascending aorta is dilated. - Mitral valve: Mildly thickened leaflets . There was mild  regurgitation. - Left atrium: Severely dilated at 57 ml/m2. - Right ventricle: The cavity size was mildly dilated. - Right atrium: Severely dilated at 27 cm2. - Tricuspid valve: There was mild regurgitation. - Pulmonary arteries: PA peak pressure: 35 mm Hg (S). - Inferior vena cava: The vessel was dilated. The  respirophasic  diameter changes were blunted (< 50%), consistent with elevated  central venous pressure.   The basic rhythm is atrial fibrillation  There are aberrated beats or PVC's and a short run of wide-complex tachycardia, suspect AF with aberrancy  There are episodes of slow atrial fibrillation   ASSESSMENT AND PLAN:  1.  Atrial fibrillation: He has a severely dilated LA and thus may have been in  AF for quite a while.  He is on Eliquis. He is remained in sinus rhythm for the last 10 months. Pacemaker placed 05/12/16.   We'll continue his Multaq. Unfortunately, his Multaq is getting quite expensive. Zara Wendt work to see if we can do to be more affordable.  This patients CHA2DS2-VASc Score and unadjusted Ischemic Stroke Rate (% per year) is equal to 3.2 % stroke rate/year from a score of 3  Above score calculated as 1 point each if present [CHF, HTN, DM, Vascular=MI/PAD/Aortic Plaque, Age if 65-74, or Male] Above score calculated as 2 points each if present [Age > 75, or Stroke/TIA/TE]  2. Hypertension: well controlled, no changes made  3. Hyperlipidemia: Continue statin  4. Coronary artery disease: Currently not having any chest pain. He is continuing to exercise. I told him it is okay to go biking for exercise.  Current medicines are reviewed at length with the patient today.   The patient does not have concerns regarding his medicines.  The following changes were made today:  none  Labs/ tests ordered today include:  Orders Placed This Encounter  Procedures  . EKG 12-Lead     Disposition:   FU with Arvis Zwahlen 6 months  Signed, Finneas Mathe Derrick Leeds, MD  03/28/2017 9:54 AM     [facility] [address] [place] [address] [phone] (office) [phone] (fax)

## 2017-03-29 ENCOUNTER — Other Ambulatory Visit (INDEPENDENT_AMBULATORY_CARE_PROVIDER_SITE_OTHER): Payer: Medicare Other

## 2017-03-29 ENCOUNTER — Encounter: Payer: Self-pay | Admitting: Internal Medicine

## 2017-03-29 ENCOUNTER — Telehealth: Payer: Self-pay | Admitting: Internal Medicine

## 2017-03-29 ENCOUNTER — Ambulatory Visit (INDEPENDENT_AMBULATORY_CARE_PROVIDER_SITE_OTHER): Payer: Medicare Other | Admitting: Internal Medicine

## 2017-03-29 VITALS — BP 132/76 | HR 64 | Temp 97.9°F | Resp 16 | Ht 70.0 in | Wt 199.0 lb

## 2017-03-29 DIAGNOSIS — I25810 Atherosclerosis of coronary artery bypass graft(s) without angina pectoris: Secondary | ICD-10-CM | POA: Diagnosis not present

## 2017-03-29 DIAGNOSIS — R7303 Prediabetes: Secondary | ICD-10-CM | POA: Insufficient documentation

## 2017-03-29 DIAGNOSIS — R739 Hyperglycemia, unspecified: Secondary | ICD-10-CM | POA: Diagnosis not present

## 2017-03-29 DIAGNOSIS — E785 Hyperlipidemia, unspecified: Secondary | ICD-10-CM

## 2017-03-29 DIAGNOSIS — I10 Essential (primary) hypertension: Secondary | ICD-10-CM | POA: Diagnosis not present

## 2017-03-29 DIAGNOSIS — I2510 Atherosclerotic heart disease of native coronary artery without angina pectoris: Secondary | ICD-10-CM | POA: Diagnosis not present

## 2017-03-29 LAB — HEMOGLOBIN A1C: Hgb A1c MFr Bld: 5 % (ref 4.6–6.5)

## 2017-03-29 LAB — CBC WITH DIFFERENTIAL/PLATELET
Basophils Absolute: 0.1 10*3/uL (ref 0.0–0.1)
Basophils Relative: 1.1 % (ref 0.0–3.0)
Eosinophils Absolute: 0.3 10*3/uL (ref 0.0–0.7)
Eosinophils Relative: 5.2 % — ABNORMAL HIGH (ref 0.0–5.0)
HCT: 40.3 % (ref 39.0–52.0)
Hemoglobin: 13.9 g/dL (ref 13.0–17.0)
Lymphocytes Relative: 22.1 % (ref 12.0–46.0)
Lymphs Abs: 1.1 10*3/uL (ref 0.7–4.0)
MCHC: 34.6 g/dL (ref 30.0–36.0)
MCV: 90.1 fl (ref 78.0–100.0)
Monocytes Absolute: 0.7 10*3/uL (ref 0.1–1.0)
Monocytes Relative: 13.2 % — ABNORMAL HIGH (ref 3.0–12.0)
Neutro Abs: 3 10*3/uL (ref 1.4–7.7)
Neutrophils Relative %: 58.4 % (ref 43.0–77.0)
Platelets: 196 10*3/uL (ref 150.0–400.0)
RBC: 4.47 Mil/uL (ref 4.22–5.81)
RDW: 13.5 % (ref 11.5–15.5)
WBC: 5.1 10*3/uL (ref 4.0–10.5)

## 2017-03-29 LAB — COMPREHENSIVE METABOLIC PANEL
ALT: 16 U/L (ref 0–53)
AST: 19 U/L (ref 0–37)
Albumin: 4.6 g/dL (ref 3.5–5.2)
Alkaline Phosphatase: 53 U/L (ref 39–117)
BUN: 19 mg/dL (ref 6–23)
CO2: 32 mEq/L (ref 19–32)
Calcium: 10.2 mg/dL (ref 8.4–10.5)
Chloride: 100 mEq/L (ref 96–112)
Creatinine, Ser: 1.01 mg/dL (ref 0.40–1.50)
GFR: 76.75 mL/min (ref >60.00)
Glucose, Bld: 74 mg/dL (ref 70–99)
Potassium: 3.9 mEq/L (ref 3.5–5.1)
Sodium: 139 mEq/L (ref 135–145)
Total Bilirubin: 0.8 mg/dL (ref 0.2–1.2)
Total Protein: 7.4 g/dL (ref 6.0–8.3)

## 2017-03-29 LAB — TSH: TSH: 3.02 u[IU]/mL (ref 0.35–4.50)

## 2017-03-29 NOTE — Telephone Encounter (Signed)
Patient called and asked if he could switch to Dr.Bedsole.  Patient's wife saw Dr.Bedsole [facility] and he said that's the kind of relationship you should have with your PCP.  Can patient switch to Dr.Bedsole? Patient said a detailed message can be left on his voice mail.

## 2017-03-29 NOTE — Telephone Encounter (Signed)
yes

## 2017-03-29 NOTE — Progress Notes (Signed)
Subjective:  Patient ID: Derrick [name], male    DOB: 08-Nov-1941  Age: 76 y.o. MRN: [id number]  CC: Hypertension   HPI Acea Yagi presents for f/up - He spent most of the visit complaining about me. He recently requested a refill on antihypertensives but I had not seen him in nearly a year so I requested an appointment before refilling antihypertensives. We ended up sending a refill for him until he came in for visit. His visit in March was canceled due to inclement weather. He was overdue for monitoring his electrolytes and blood sugar. He complained that he has to travel a lot and see a lot of other doctors and he didn't think that he should have to see me for anything other that a wellness visit. He was concerned that I was following someone else's rules like Medicare or [place] or or some insurance company. I informed him that I was not following someone else's rules I was following my own rules. I informed him that people on antihypertensives like diuretics need to be seen every 4-6 months and everyone needs to be seen once a year for managing medical problems. I still think he is unhappy with me and I anticipate he will change to a new provider. Nonetheless, he feels well today and offers no physical complaints.  Outpatient Medications Prior to Visit  Medication Sig Dispense Refill  . albuterol (PROVENTIL HFA;VENTOLIN HFA) 108 (90 BASE) MCG/ACT inhaler Inhale 2 puffs into the lungs every 6 (six) hours as needed for wheezing or shortness of breath.    [name] amLODipine (NORVASC) 10 MG tablet Take 1 tablet (10 mg total) by mouth daily. 90 tablet 0  . atorvastatin (LIPITOR) 20 MG tablet TAKE ONE TABLET BY MOUTH ONCE DAILY 90 tablet 1  . bimatoprost (LUMIGAN) 0.01 % SOLN Place 1 drop into both eyes at bedtime.    . budesonide (PULMICORT) 180 MCG/ACT inhaler Inhale 2 puffs into the lungs 2 (two) times daily. 3 each 3  . cetirizine (ZYRTEC) 10 MG tablet Take 10 mg by mouth daily as needed for  allergies.    . CHOLECALCIFEROL PO Take 1,000 mg by mouth daily. Vitamin D 3    . dorzolamide-timolol (COSOPT) 22.3-6.8 MG/ML ophthalmic solution Place 1 drop into both eyes 2 (two) times daily.     [name] dronedarone (MULTAQ) 400 MG tablet Take 1 tablet (400 mg total) by mouth 2 (two) times daily with a meal. 180 tablet 3  . ELIQUIS 5 MG TABS tablet TAKE ONE TABLET BY MOUTH TWICE DAILY 180 tablet 1  . finasteride (PROSCAR) 5 MG tablet Take 5 mg by mouth daily.     . fluticasone (FLONASE) 50 MCG/ACT nasal spray Place 2 sprays into both nostrils 2 (two) times daily.    [name] gabapentin (NEURONTIN) 800 MG tablet Take 800 mg by mouth 3 (three) times daily.    . pantoprazole (PROTONIX) 40 MG tablet TAKE ONE TABLET BY MOUTH ONCE DAILY 90 tablet 1  . tadalafil (CIALIS) 5 MG tablet Take 5 mg by mouth daily. Reported on 05/26/2016    . triamterene-hydrochlorothiazide (MAXZIDE-25) 37.5-25 MG tablet Take 1 tablet by mouth daily. 90 tablet 0  . Multiple Vitamin (MULTIVITAMIN) tablet Take 1 tablet by mouth daily.       No facility-administered medications prior to visit.     ROS Review of Systems  Constitutional: Negative.  Negative for appetite change, diaphoresis, fatigue and unexpected weight change.  HENT: Negative.  Negative for facial swelling and  trouble swallowing.   Eyes: Negative.  Negative for visual disturbance.  Respiratory: Negative for cough, chest tightness, shortness of breath and wheezing.   Cardiovascular: Negative for chest pain, palpitations and leg swelling.  Gastrointestinal: Negative for abdominal pain, constipation, diarrhea, nausea and vomiting.  Endocrine: Negative.   Genitourinary: Negative.  Negative for difficulty urinating.  Musculoskeletal: Negative.  Negative for arthralgias, back pain, myalgias and neck pain.  Allergic/Immunologic: Negative.   Neurological: Negative.  Negative for dizziness, weakness and headaches.  Hematological: Does not bruise/bleed easily.    Psychiatric/Behavioral: Negative.  Negative for dysphoric mood and suicidal ideas. The patient is not nervous/anxious.     Objective:  BP 132/76 (BP Location: Right Arm, Patient Position: Sitting, Cuff Size: Normal)   Pulse 64   Temp 97.9 F (36.6 C) (Oral)   Resp 16   Ht 5\' 10"  (1.778 m)   Wt 199 lb 0.6 oz (90.3 kg)   SpO2 97%   BMI 28.56 kg/m   BP Readings from Last 3 Encounters:  03/29/17 132/76  03/28/17 120/80  09/09/16 120/70    Wt Readings from Last 3 Encounters:  03/29/17 199 lb 0.6 oz (90.3 kg)  03/28/17 200 lb 3.2 oz (90.8 kg)  09/09/16 195 lb (88.5 kg)    Physical Exam  Constitutional: He is oriented to person, place, and time. No distress.  HENT:  Mouth/Throat: Oropharynx is clear and moist. No oropharyngeal exudate.  Eyes: Conjunctivae are normal. Right eye exhibits no discharge. Left eye exhibits no discharge. No scleral icterus.  Neck: Normal range of motion. Neck supple. No JVD present. No tracheal deviation present. No thyromegaly present.  Cardiovascular: Normal rate, regular rhythm and intact distal pulses.  Exam reveals no gallop and no friction rub.   No murmur heard. Pulmonary/Chest: Effort normal and breath sounds normal. No stridor. No respiratory distress. He has no wheezes. He has no rales. He exhibits no tenderness.  Abdominal: Soft. Bowel sounds are normal. He exhibits no distension and no mass. There is no tenderness. There is no rebound and no guarding.  Musculoskeletal: Normal range of motion. He exhibits no edema, tenderness or deformity.  Lymphadenopathy:    He has no cervical adenopathy.  Neurological: He is oriented to person, place, and time.  Skin: Skin is warm and dry. No rash noted. He is not diaphoretic. No erythema. No pallor.  Psychiatric: He has a normal mood and affect. His behavior is normal. Judgment and thought content normal.  Vitals reviewed.   Lab Results  Component Value Date   WBC 4.7 07/27/2016   HGB 14.1  07/27/2016   HCT 40.8 07/27/2016   PLT 178 07/27/2016   GLUCOSE 122 07/27/2016   CHOL 139 03/19/2016   TRIG 70 03/19/2016   HDL 51 03/19/2016   LDLCALC 74 03/19/2016   ALT 19 07/27/2016   AST 24 07/27/2016   NA 141 07/27/2016   K 3.9 07/27/2016   CL 103 05/12/2016   CREATININE 1.0 07/27/2016   BUN 16.0 07/27/2016   CO2 30 (H) 07/27/2016   TSH 2.47 02/09/2016   PSA 3.14 10/07/2016   INR 1.1 (H) 03/26/2015    Dg Chest 2 View  Result Date: 05/13/2016 CLINICAL DATA:  Cardiac pacer. EXAM: CHEST  2 VIEW COMPARISON:  06/05/ 2012. FINDINGS: Cardiac pacer with lead tips in right atrium right ventricle. Cardiomegaly with mild pulmonary vascular prominence. Low lung volumes with bibasilar atelectasis. Small right pleural effusion cannot be excluded. No pneumothorax. IMPRESSION: 1. Cardiac pacer noted with lead  tips in right atrium and right ventricle. Cardiomegaly with mild pulmonary vascular prominence. 2. Low lung volumes with bibasilar atelectasis. Small right pleural effusion cannot be excluded . Electronically Signed   By: [name]  Register   On: 05/13/2016 07:37    Assessment & Plan:   Hasnain was seen today for hypertension.  Diagnoses and all orders for this visit:  Coronary artery disease involving coronary bypass graft of native heart without angina pectoris  Atherosclerosis of native coronary artery of native heart without angina pectoris- he's had no recent episodes of angina, he saw a cardiologist one day prior to this visit and tells me he has a follow-up with another cardiologist soon. Will continue to modify his risk factors with blood pressure control and statin therapy.  Essential hypertension, benign- his blood pressure is adequately well controlled, I will monitor his electrolytes and renal function -     Comprehensive metabolic panel; Future -     CBC with Differential/Platelet; Future  Hyperlipidemia with target LDL less than 100- He tried to order lipid panel on him  today but it was blocked because the cardiologist has ordered a lipid panel on him during an upcoming visit. -     TSH; Future  Hyperglycemia- I will check an A1c today to see if he has developed type 2 diabetes mellitus. -     Hemoglobin A1c; Future   I have discontinued Mr. Wimer multivitamin. I am also having him maintain his tadalafil, finasteride, CHOLECALCIFEROL PO, budesonide, bimatoprost, dorzolamide-timolol, albuterol, fluticasone, gabapentin, dronedarone, cetirizine, ELIQUIS, pantoprazole, atorvastatin, amLODipine, and triamterene-hydrochlorothiazide.  No orders of the defined types were placed in this encounter.    Follow-up: No Follow-up on file.  [name], MD

## 2017-03-29 NOTE — Progress Notes (Signed)
Pre visit review using our clinic review tool, if applicable. No additional management support is needed unless otherwise documented below in the visit note. 

## 2017-03-29 NOTE — Patient Instructions (Signed)

## 2017-03-31 NOTE — Telephone Encounter (Signed)
Pt complicated but see no reason for issue with transfer of care. Please make sure 1st appt for both husband and wife is 30 min.

## 2017-03-31 NOTE — Telephone Encounter (Signed)
I spoke to patient and he scheduled appointment on 06/10/17.

## 2017-04-04 ENCOUNTER — Other Ambulatory Visit: Payer: Medicare Other | Admitting: *Deleted

## 2017-04-04 ENCOUNTER — Other Ambulatory Visit: Payer: Self-pay | Admitting: *Deleted

## 2017-04-04 DIAGNOSIS — E785 Hyperlipidemia, unspecified: Secondary | ICD-10-CM

## 2017-04-05 LAB — HEPATIC FUNCTION PANEL
ALT: 15 IU/L (ref 0–44)
AST: 17 IU/L (ref 0–40)
Albumin: 4.4 g/dL (ref 3.5–4.8)
Alkaline Phosphatase: 54 IU/L (ref 39–117)
Bilirubin Total: 0.4 mg/dL (ref 0.0–1.2)
Bilirubin, Direct: 0.14 mg/dL (ref 0.00–0.40)
Total Protein: 6.7 g/dL (ref 6.0–8.5)

## 2017-04-05 LAB — LIPID PANEL
Chol/HDL Ratio: 2.9 ratio (ref 0.0–5.0)
Cholesterol, Total: 164 mg/dL (ref 100–199)
HDL: 57 mg/dL (ref >39)
LDL Calculated: 90 mg/dL (ref 0–99)
Triglycerides: 85 mg/dL (ref 0–149)
VLDL Cholesterol Cal: 17 mg/dL (ref 5–40)

## 2017-04-07 ENCOUNTER — Encounter: Payer: Self-pay | Admitting: Cardiovascular Disease

## 2017-04-07 ENCOUNTER — Ambulatory Visit (INDEPENDENT_AMBULATORY_CARE_PROVIDER_SITE_OTHER): Payer: Medicare Other | Admitting: Cardiovascular Disease

## 2017-04-07 VITALS — BP 132/62 | HR 63 | Ht 70.0 in | Wt 201.0 lb

## 2017-04-07 DIAGNOSIS — I2510 Atherosclerotic heart disease of native coronary artery without angina pectoris: Secondary | ICD-10-CM

## 2017-04-07 DIAGNOSIS — I481 Persistent atrial fibrillation: Secondary | ICD-10-CM | POA: Diagnosis not present

## 2017-04-07 DIAGNOSIS — I059 Rheumatic mitral valve disease, unspecified: Secondary | ICD-10-CM | POA: Diagnosis not present

## 2017-04-07 DIAGNOSIS — I350 Nonrheumatic aortic (valve) stenosis: Secondary | ICD-10-CM | POA: Diagnosis not present

## 2017-04-07 DIAGNOSIS — I25810 Atherosclerosis of coronary artery bypass graft(s) without angina pectoris: Secondary | ICD-10-CM

## 2017-04-07 DIAGNOSIS — I4819 Other persistent atrial fibrillation: Secondary | ICD-10-CM

## 2017-04-07 MED ORDER — AMLODIPINE BESYLATE 10 MG PO TABS: 10.0000 mg | ORAL_TABLET | Freq: Every day | ORAL | 3 refills | Status: DC

## 2017-04-07 MED ORDER — TRIAMTERENE-HCTZ 37.5-25 MG PO TABS: 1.0000 | ORAL_TABLET | Freq: Every day | ORAL | 3 refills | Status: DC

## 2017-04-07 NOTE — Progress Notes (Signed)
Cardiology Office Note Date:  04/07/2017   ID:  Derrick [name], DOB [birth year], MRN [id number]  PCP:  [name], MD  Cardiologist:  [name], MD    Chief Complaint  Patient presents with  . Fatigue   History of Present Illness: Derrick [name] is a 76 y.o. male who presents for follow-up of coronary artery disease. He has a history of myocardial infarction and drug-eluting stent placement in the right coronary artery. 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 He's been managed medically. He has multiple noncardiac problems including CIDP, pulmonary sarcoid, and monoclonal gammopathy of undetermined significance. He last underwent cardiac catheterization in April 2016 showing stable coronary anatomy.   The patient was found to have slow atrial fibrillation in 2017. He underwent permanent pacemaker placement in May 2017. He is anticoagulated with Eliquis. He is treated with a rhythm control strategy using Multaq. His CHADS-Vasc score is 3.  He is here with his wife today. Some days he feels weak. Describes feeling as having 'lead in the legs.' Otherwise has no cardiac related complaints. He denies pain in the chest or upper back. No shortness of breath or heart palpitations. No lightheadedness or presyncope. No postural symptoms. No leg edema, orthopnea, or PND.  Past Medical History:  Diagnosis Date  . BENIGN PROSTATIC HYPERTROPHY, WITH OBSTRUCTION 05/28/2010  . CAD (coronary artery disease)    a. s/p NSTEMI 06/01/11: DES to RCA;  b. cath 06/25/11:   dLM 50-60% (FFR 0.87), prox to mid LAD 40-50%, D1 50%, pCFX 50%, RCA stent ok, dPDA 80%, EF 55-60%.  His FFR was felt to be negative and therefore medical therapy was recommended ;  echo 6/12: EF 55-60%, mild AS   . Chronic back pain   . CIDP (chronic inflammatory demyelinating polyneuropathy) ([place]) 04/11/2012  . Colon cancer ([place]) dx'd 2000   "left"  . COLONIC POLYPS,  ADENOMATOUS, HX OF 05/28/2010  . Complication of anesthesia    "stopped breathing; related to my sleep apnea"   . COPD (chronic obstructive pulmonary disease) ([place])    "associated w/lung sarcoidosis"  . 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 DISH (diffuse idiopathic skeletal hyperostosis) 12/02/2015  . Diverticulitis   . GENERALIZED OSTEOARTHROSIS UNSPECIFIED SITE 05/28/2010  . GERD 05/28/2010  . Heart murmur   . History of gout   . HYPERLIPIDEMIA 05/28/2010  . Hypertension   . IgM lambda paraproteinemia   . 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 Malignant neoplasm of descending colon ([place]) 05/28/2010  . MITRAL VALVE PROLAPSE 05/28/2010  . 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 NSTEMI (non-ST elevated myocardial infarction) ([place]) 06/03/11  . 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  . OSA on CPAP 05/28/2010  . PALPITATIONS, CHRONIC 05/28/2010  . Parotid gland pain 2010   infection  . Peripheral neuropathy ([place])    "tx'd w/targeted chemo" (05/12/2016)  . Pneumonia 2-3 times  . Presence of permanent cardiac pacemaker   . 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 Sarcoidosis ([place])    pulmonalis  . UNSPECIFIED INFLAMMATORY AND TOXIC NEUROPATHY 05/28/2010  . Vision loss   . VITAMIN D DEFICIENCY 05/28/2010    Past Surgical History:  Procedure Laterality Date  . APPENDECTOMY  1954  . CATARACT EXTRACTION W/ INTRAOCULAR LENS IMPLANT Left   . COLON SURGERY  2000   decending colon   . CORONARY ANGIOPLASTY WITH STENT PLACEMENT  06/03/11  . ELECTROPHYSIOLOGIC STUDY N/A 05/12/2016  Procedure: Cardioversion;  Surgeon: [name], MD;  Location: [facility];  Service: Cardiovascular;  Laterality: N/A;  . EP IMPLANTABLE DEVICE N/A 05/12/2016   Procedure: Pacemaker Implant;  Surgeon: [name], MD;  Location: [facility];  Service: Cardiovascular;  Laterality: N/A;  . INSERT / REPLACE / REMOVE PACEMAKER  05/12/2016  .  KNEE ARTHROSCOPY Right 2003  . LEFT HEART CATHETERIZATION WITH CORONARY ANGIOGRAM N/A 03/31/2015   Procedure: LEFT HEART CATHETERIZATION WITH CORONARY ANGIOGRAM;  Surgeon: [name], MD;  Location: [facility];  Service: Cardiovascular;  Laterality: N/A;  . LUNG SURGERY  2001   "open lung dissection"  . MOHS SURGERY Left ~ 2008   ear  . PROSTATE BIOPSY  2010  . TONSILLECTOMY  1950    Current Outpatient Prescriptions  Medication Sig Dispense Refill  . albuterol (PROVENTIL HFA;VENTOLIN HFA) 108 (90 BASE) MCG/ACT inhaler Inhale 2 puffs into the lungs every 6 (six) hours as needed for wheezing or shortness of breath.    [name] amLODipine (NORVASC) 10 MG tablet Take 1 tablet (10 mg total) by mouth daily. 90 tablet 3  . atorvastatin (LIPITOR) 20 MG tablet TAKE ONE TABLET BY MOUTH ONCE DAILY 90 tablet 1  . bimatoprost (LUMIGAN) 0.01 % SOLN Place 1 drop into both eyes at bedtime.    . CHOLECALCIFEROL PO Take 1,000 mg by mouth daily. Vitamin D 3    . dorzolamide-timolol (COSOPT) 22.3-6.8 MG/ML ophthalmic solution Place 1 drop into both eyes 2 (two) times daily.     [name] dronedarone (MULTAQ) 400 MG tablet Take 1 tablet (400 mg total) by mouth 2 (two) times daily with a meal. 180 tablet 3  . ELIQUIS 5 MG TABS tablet TAKE ONE TABLET BY MOUTH TWICE DAILY 180 tablet 1  . finasteride (PROSCAR) 5 MG tablet Take 5 mg by mouth daily.     . fluticasone (FLONASE) 50 MCG/ACT nasal spray Place 2 sprays into both nostrils 2 (two) times daily.    [name] gabapentin (NEURONTIN) 800 MG tablet Take 800 mg by mouth 3 (three) times daily.    . pantoprazole (PROTONIX) 40 MG tablet TAKE ONE TABLET BY MOUTH ONCE DAILY 90 tablet 1  . tadalafil (CIALIS) 5 MG tablet Take 5 mg by mouth daily. Reported on 05/26/2016    . triamterene-hydrochlorothiazide (MAXZIDE-25) 37.5-25 MG tablet Take 1 tablet by mouth daily. 90 tablet 3  . budesonide (PULMICORT) 180 MCG/ACT inhaler Inhale 2 puffs into the lungs 2 (two) times daily. (Patient not  taking: Reported on 04/07/2017) 3 each 3  . cetirizine (ZYRTEC) 10 MG tablet Take 10 mg by mouth daily as needed for allergies.     No current facility-administered medications for this visit.     Allergies:   Patient has no known allergies.   Social History:  The patient  reports that he has never smoked. He has never used smokeless tobacco. He reports that he drinks about 10.8 oz of alcohol per week . He reports that he does not use drugs.   Family History:  The patient's family history includes Arrhythmia (age of onset: 54) in his brother; Arthritis in his mother; Cancer in his brother; Colon polyps in his mother; Coronary artery disease in his brother and mother.    ROS:  Please see the history of present illness.  Otherwise, review of systems is positive for mild leg swelling, back pain, easy bruising, excessive fatigue, balance problems.  All other systems are reviewed and negative.  PHYSICAL EXAM: VS:  BP 132/62   Pulse 63   Ht 5\' 10"  (1.778 m)   Wt 201 lb (91.2 kg)   SpO2 98%   BMI 28.84 kg/m  , BMI Body mass index is 28.84 kg/m. GEN: Well nourished, well developed, in no acute distress  HEENT: normal  Neck: no JVD, no masses. No carotid bruits Cardiac: RRR with 2/6 mid-peaking harsh systolic murmur at the RUSB            Respiratory:  clear to auscultation bilaterally, normal work of breathing GI: soft, nontender, nondistended, + BS MS: no deformity or atrophy  Ext: no pretibial edema, pedal pulses 2+= bilaterally Skin: warm and dry, no rash Neuro:  Strength and sensation are intact Psych: euthymic mood, full affect  EKG:  EKG is not ordered today.  Recent Labs: 03/29/2017: BUN 19; Creatinine, Ser 1.01; Hemoglobin 13.9; Platelets 196.0; Potassium 3.9; Sodium 139; TSH 3.02 04/04/2017: ALT 15   Lipid Panel     Component Value Date/Time   CHOL 164 04/04/2017 1405   TRIG 85 04/04/2017 1405   HDL 57 04/04/2017 1405   CHOLHDL 2.9 04/04/2017 1405   CHOLHDL 2.7  03/19/2016 0922   VLDL 14 03/19/2016 0922   LDLCALC 90 04/04/2017 1405      Wt Readings from Last 3 Encounters:  04/07/17 201 lb (91.2 kg)  03/29/17 199 lb 0.6 oz (90.3 kg)  03/28/17 200 lb 3.2 oz (90.8 kg)     Cardiac Studies Reviewed: 2D Echo 04-13-2016: Study Conclusions  - Left ventricle: The cavity size was normal. Wall thickness was   normal. Systolic function was vigorous. The estimated ejection   fraction was in the range of 65% to 70%. Wall motion was normal;   there were no regional wall motion abnormalities. The study is   not technically sufficient to allow evaluation of LV diastolic   function. - Aortic valve: Trileaflet; mildly calcified leaflets. Mild   stenosis. Mean gradient (S): 12 mm Hg. Peak gradient (S): 22 mm   Hg. Valve area (VTI): 2.58 cm^2. Valve area (Vmax): 2.92 cm^2.   Valve area (Vmean): 2.91 cm^2. - Aorta: Ascending aortic diameter: 41 mm (S). - Ascending aorta: The ascending aorta is dilated. - Mitral valve: Mildly thickened leaflets . There was mild   regurgitation. - Left atrium: Severely dilated at 57 ml/m2. - Right ventricle: The cavity size was mildly dilated. - Right atrium: Severely dilated at 27 cm2. - Tricuspid valve: There was mild regurgitation. - Pulmonary arteries: PA peak pressure: 35 mm Hg (S). - Inferior vena cava: The vessel was dilated. The respirophasic   diameter changes were blunted (< 50%), consistent with elevated   central venous pressure.  Impressions:  - LVEF 65-70%, normal wall thickness and motion, calcified aortic   valve with mild stenosis, dilated ascending aorta to 4.1 cm, mild   to moderate MR, severe biatrial enlargement, mild TR, RVSP 35   mmHg, dilated IVC.   ASSESSMENT AND PLAN: 1.  Coronary artery disease, native vessel: No symptoms of angina. The patient's medical program is reviewed and will be continued without change.  2. Paroxysmal atrial fibrillation: Patient is anticoagulated with Eliquis.  He is treated with dronedarone. Followed by Dr Curt Bears. Pt s/p PPM  3. Hyperlipidemia: recent labs reviewed and a copy of results given to patient. He remains on atorvastatin.   4. HTN: BP controlled on current Rx.   5. Valvular heart disease: Patient with mild aortic stenosis and mild-to-moderate mitral regurgitation.  Recommended a repeat echocardiogram in 6 months before his next visit.  Current medicines are reviewed with the patient today.  The patient does not have concerns regarding medicines.  Labs/ tests ordered today include:   Orders Placed This Encounter  Procedures  . ECHOCARDIOGRAM COMPLETE    Disposition:   FU 6 months  Signed, [name], MD  04/07/2017 1:39 PM    [facility] [place], [place], [place]  [address] Phone: [phone]; Fax: [phone]

## 2017-04-07 NOTE — Patient Instructions (Signed)
Medication Instructions:  Your physician recommends that you continue on your current medications as directed. Please refer to the Current Medication list given to you today.  Labwork: No new orders.   Testing/Procedures: Your physician has requested that you have an echocardiogram. Echocardiography is a painless test that uses sound waves to create images of your heart. It provides your doctor with information about the size and shape of your heart and how well your heart's chambers and valves are working. This procedure takes approximately one hour. There are no restrictions for this procedure.  Follow-Up: Your physician wants you to follow-up in: 6 MONTHS with Dr Cooper.  You will receive a reminder letter in the mail two months in advance. If you don't receive a letter, please call our office to schedule the follow-up appointment.   Any Other Special Instructions Will Be Listed Below (If Applicable).     If you need a refill on your cardiac medications before your next appointment, please call your pharmacy.   

## 2017-04-14 ENCOUNTER — Telehealth: Payer: Self-pay

## 2017-04-14 NOTE — Telephone Encounter (Signed)
Patient is concerned over the price of Multaq. His 90 day co-pay was $300, and after that he will be in the donut hole. I informed him that there are programs for assistance that he would probably qualify for. He will call us back when he has 30 days of Multaq left. He thanked me for the call back.

## 2017-04-15 ENCOUNTER — Other Ambulatory Visit: Payer: Self-pay

## 2017-04-15 MED ORDER — DRONEDARONE HCL 400 MG PO TABS: 400.0000 mg | ORAL_TABLET | Freq: Two times a day (BID) | ORAL | 3 refills | Status: DC

## 2017-05-04 ENCOUNTER — Ambulatory Visit (HOSPITAL_COMMUNITY): Payer: Medicare Other | Attending: Cardiovascular Disease

## 2017-05-04 ENCOUNTER — Other Ambulatory Visit: Payer: Self-pay

## 2017-05-04 DIAGNOSIS — I059 Rheumatic mitral valve disease, unspecified: Secondary | ICD-10-CM | POA: Diagnosis not present

## 2017-05-04 DIAGNOSIS — I2510 Atherosclerotic heart disease of native coronary artery without angina pectoris: Secondary | ICD-10-CM | POA: Diagnosis not present

## 2017-05-04 DIAGNOSIS — I481 Persistent atrial fibrillation: Secondary | ICD-10-CM

## 2017-05-04 DIAGNOSIS — I361 Nonrheumatic tricuspid (valve) insufficiency: Secondary | ICD-10-CM | POA: Diagnosis not present

## 2017-05-04 DIAGNOSIS — I358 Other nonrheumatic aortic valve disorders: Secondary | ICD-10-CM | POA: Diagnosis not present

## 2017-05-04 DIAGNOSIS — I5030 Unspecified diastolic (congestive) heart failure: Secondary | ICD-10-CM | POA: Diagnosis not present

## 2017-05-04 DIAGNOSIS — I420 Dilated cardiomyopathy: Secondary | ICD-10-CM | POA: Insufficient documentation

## 2017-05-04 DIAGNOSIS — I340 Nonrheumatic mitral (valve) insufficiency: Secondary | ICD-10-CM | POA: Diagnosis not present

## 2017-05-04 DIAGNOSIS — I350 Nonrheumatic aortic (valve) stenosis: Secondary | ICD-10-CM

## 2017-05-04 DIAGNOSIS — I4819 Other persistent atrial fibrillation: Secondary | ICD-10-CM

## 2017-05-04 DIAGNOSIS — I510 Cardiac septal defect, acquired: Secondary | ICD-10-CM | POA: Insufficient documentation

## 2017-05-12 ENCOUNTER — Other Ambulatory Visit: Payer: Self-pay | Admitting: Neurology

## 2017-05-12 NOTE — Telephone Encounter (Signed)
Pt reported on 09/09/2016 at his cardiology appt that he was taking the gabapentin TID instead of QID. Will send to Dr. Brett Fairy for review and approval of this change.

## 2017-05-12 NOTE — Telephone Encounter (Signed)
Refill for tid use.

## 2017-06-07 ENCOUNTER — Ambulatory Visit: Payer: Medicare Other | Admitting: Neurology

## 2017-06-07 DIAGNOSIS — E663 Overweight: Secondary | ICD-10-CM | POA: Diagnosis not present

## 2017-06-07 DIAGNOSIS — Z6829 Body mass index (BMI) 29.0-29.9, adult: Secondary | ICD-10-CM | POA: Diagnosis not present

## 2017-06-07 DIAGNOSIS — M25561 Pain in right knee: Secondary | ICD-10-CM | POA: Diagnosis not present

## 2017-06-07 DIAGNOSIS — M150 Primary generalized (osteo)arthritis: Secondary | ICD-10-CM | POA: Diagnosis not present

## 2017-06-07 DIAGNOSIS — M5489 Other dorsalgia: Secondary | ICD-10-CM | POA: Diagnosis not present

## 2017-06-10 ENCOUNTER — Encounter: Payer: Self-pay | Admitting: Family Medicine

## 2017-06-10 ENCOUNTER — Ambulatory Visit (INDEPENDENT_AMBULATORY_CARE_PROVIDER_SITE_OTHER): Payer: Medicare Other | Admitting: Family Medicine

## 2017-06-10 DIAGNOSIS — D472 Monoclonal gammopathy: Secondary | ICD-10-CM | POA: Diagnosis not present

## 2017-06-10 DIAGNOSIS — G63 Polyneuropathy in diseases classified elsewhere: Secondary | ICD-10-CM

## 2017-06-10 DIAGNOSIS — I25810 Atherosclerosis of coronary artery bypass graft(s) without angina pectoris: Secondary | ICD-10-CM | POA: Diagnosis not present

## 2017-06-10 DIAGNOSIS — M4810 Ankylosing hyperostosis [Forestier], site unspecified: Secondary | ICD-10-CM

## 2017-06-10 DIAGNOSIS — D72821 Monocytosis (symptomatic): Secondary | ICD-10-CM

## 2017-06-10 DIAGNOSIS — K2100 Gastro-esophageal reflux disease with esophagitis, without bleeding: Secondary | ICD-10-CM

## 2017-06-10 DIAGNOSIS — I481 Persistent atrial fibrillation: Secondary | ICD-10-CM | POA: Diagnosis not present

## 2017-06-10 DIAGNOSIS — I10 Essential (primary) hypertension: Secondary | ICD-10-CM

## 2017-06-10 DIAGNOSIS — G4733 Obstructive sleep apnea (adult) (pediatric): Secondary | ICD-10-CM

## 2017-06-10 DIAGNOSIS — J301 Allergic rhinitis due to pollen: Secondary | ICD-10-CM | POA: Diagnosis not present

## 2017-06-10 DIAGNOSIS — I4819 Other persistent atrial fibrillation: Secondary | ICD-10-CM

## 2017-06-10 DIAGNOSIS — R351 Nocturia: Secondary | ICD-10-CM

## 2017-06-10 DIAGNOSIS — R7303 Prediabetes: Secondary | ICD-10-CM

## 2017-06-10 DIAGNOSIS — E785 Hyperlipidemia, unspecified: Secondary | ICD-10-CM

## 2017-06-10 DIAGNOSIS — Z9989 Dependence on other enabling machines and devices: Secondary | ICD-10-CM

## 2017-06-10 DIAGNOSIS — R001 Bradycardia, unspecified: Secondary | ICD-10-CM | POA: Diagnosis not present

## 2017-06-10 DIAGNOSIS — C186 Malignant neoplasm of descending colon: Secondary | ICD-10-CM

## 2017-06-10 DIAGNOSIS — N401 Enlarged prostate with lower urinary tract symptoms: Secondary | ICD-10-CM | POA: Diagnosis not present

## 2017-06-10 NOTE — Assessment & Plan Note (Signed)
Followed by cardiologist Dr. Copper. Rate control with multaq and on eliquis

## 2017-06-10 NOTE — Assessment & Plan Note (Signed)
Well controlled on protonix.

## 2017-06-10 NOTE — Assessment & Plan Note (Signed)
Improved diet control.

## 2017-06-10 NOTE — Assessment & Plan Note (Addendum)
Followed by Dr. Fredna Dow for hand symptoms.  Followed by Rheum Dr. Marijean Bravo

## 2017-06-10 NOTE — Assessment & Plan Note (Signed)
Well controlled on ceterizine.

## 2017-06-10 NOTE — Assessment & Plan Note (Signed)
No angina and  Currently euvolemic.

## 2017-06-10 NOTE — Progress Notes (Signed)
Subjective:    Patient ID: Derrick [name], male    DOB: 07-13-1941, 76 y.o.   MRN: [id number]  HPI    76 year old male presents to establish care.  previous MD:  Dr. Ronnald Ramp. Last CPX 03/29/2017  MGUS, IgM Dx 2008:  Treated with ritoxin for 2 years in [place]. Now followed Dr. Alvy Bimler q6 months.  Has had neuropathy for many years.. Progressive worsening now moderate to severe. Possibly from MGUS.  Gabapentin controls his symptoms somewhat.. Burning in leg and feet. Does cause some memory issues.  Monocytes at last lab check... 13.2, plan recheck in 07/2017.  DISH: diffuse.. Pain in right knee, hands, back, ankles  Dr. Burt Knack cardiology follow pt for  CAD,He has a history of myocardial infarction and drug-eluting stent placement in the right coronary artery. 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 He's been managed medically. Atrial Fibrillation: dx in 2017. He underwent permanent pacemaker placement in May 2017. He is anticoagulated with Eliquis. He is treated with a rhythm control strategy using Multaq. His CHADS-Vasc score is 3. Last OV reviewed 03/2017. No current anginal symptoms.  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 Minimal symptoms, no longer on pulmicort. Rarely using albuterol. Only issue is when he gets lung infection.. Needs inhaler.  OSA on CPAP.    Hx of colon cancer 2000      Blood  pressure 100/60, pulse 70, temperature 98.4 F (36.9 C), temperature source Oral, height 5\' 10"  (1.778 m), weight 200 lb (90.7 kg), SpO2 97 %. Review of Systems  Constitutional: Negative for fatigue and fever.  HENT: Negative for ear pain.   Eyes: Negative for pain.  Respiratory: Negative for cough and shortness of breath.   Cardiovascular: Negative for chest pain, palpitations and leg swelling.  Gastrointestinal: Negative for abdominal pain.  Genitourinary: Negative for dysuria.  Musculoskeletal: Positive for arthralgias. Negative for myalgias.  Neurological: Negative for syncope, light-headedness and headaches.  Psychiatric/Behavioral: Negative for dysphoric mood.       Objective:   Physical Exam  Constitutional: He appears well-developed and well-nourished.  Non-toxic appearance. He does not appear ill. No distress.  HENT:  Head: Normocephalic and atraumatic.  Right Ear: Hearing, tympanic membrane, external ear and ear canal normal.  Left Ear: Hearing, tympanic membrane, external ear and ear canal normal.  Nose: Nose normal.  Mouth/Throat: Uvula is midline, oropharynx is clear and moist and mucous membranes are normal.  Eyes: Conjunctivae, EOM and lids are normal. Pupils are equal, round, and reactive to light. Lids are everted and swept, no foreign bodies found.  Neck: Trachea normal, normal range of motion and phonation normal. Neck supple. Carotid bruit is not present. No thyroid mass and no thyromegaly present.  Cardiovascular: Normal rate, regular rhythm, S1 normal, S2 normal, intact distal pulses and normal pulses.  Exam reveals no gallop.   No murmur heard. Pulmonary/Chest: Breath sounds normal. He has  no wheezes. He has no rhonchi. He has no rales.  Abdominal: Soft. Normal appearance and bowel sounds are normal. There is no hepatosplenomegaly. There is no tenderness. There is no rebound, no guarding and no CVA tenderness. No hernia.  Lymphadenopathy:    He has no cervical  adenopathy.  Neurological: He is alert. He has normal strength and normal reflexes. A sensory deficit is present. No cranial nerve deficit. Coordination and gait normal.  Skin: Skin is warm, dry and intact. No rash noted.  Psychiatric: He has a normal mood and affect. His speech is normal and behavior is normal. Judgment normal.          Assessment & Plan:

## 2017-06-10 NOTE — Assessment & Plan Note (Signed)
Controlled on 20 mg  lipitor.  mild SE of muscle ache, tolerable.

## 2017-06-10 NOTE — Assessment & Plan Note (Signed)
Dx 2000 Treated with resection and no chemotherapy.

## 2017-06-10 NOTE — Assessment & Plan Note (Signed)
Slight increase in labs at 03/2017. Needs re-eval.

## 2017-06-10 NOTE — Assessment & Plan Note (Signed)
Moderate control with gabapentin.   Causes balanace to be off... Causes falls.

## 2017-06-10 NOTE — Assessment & Plan Note (Signed)
Followed by Dr. Alvy Bimler.

## 2017-06-10 NOTE — Assessment & Plan Note (Signed)
Followed by Dr. Alinda Money. Stable on finasteride and cialis.

## 2017-06-10 NOTE — Assessment & Plan Note (Signed)
Well controlled. Continue current medication.  

## 2017-06-10 NOTE — Patient Instructions (Signed)
Keep working on healthy eating and regular exercise! 

## 2017-06-10 NOTE — Assessment & Plan Note (Signed)
Works well for him. Asymptomatoc on CPAP.

## 2017-06-10 NOTE — Assessment & Plan Note (Signed)
Pacemaker in place

## 2017-06-27 ENCOUNTER — Ambulatory Visit (INDEPENDENT_AMBULATORY_CARE_PROVIDER_SITE_OTHER): Payer: Medicare Other | Admitting: *Deleted

## 2017-06-27 DIAGNOSIS — R001 Bradycardia, unspecified: Secondary | ICD-10-CM | POA: Diagnosis not present

## 2017-06-27 NOTE — Progress Notes (Signed)
Remote pacemaker transmission.   

## 2017-06-28 ENCOUNTER — Encounter: Payer: Self-pay | Admitting: Cardiology

## 2017-06-28 LAB — CUP PACEART REMOTE DEVICE CHECK
Battery Remaining Longevity: 86 mo
Battery Voltage: 3.01 V
Brady Statistic AP VP Percent: 98.5 %
Brady Statistic AP VS Percent: 0.02 %
Brady Statistic AS VP Percent: 1.47 %
Brady Statistic AS VS Percent: 0.01 %
Brady Statistic RA Percent Paced: 98.41 %
Brady Statistic RV Percent Paced: 99.72 %
Date Time Interrogation Session: 20180702131211
Implantable Lead Implant Date: 20170517
Implantable Lead Implant Date: 20170517
Implantable Lead Location: 753859
Implantable Lead Location: 753860
Implantable Lead Model: 5076
Implantable Lead Model: 5076
Implantable Pulse Generator Implant Date: 20170517
Lead Channel Impedance Value: 399 Ohm
Lead Channel Impedance Value: 494 Ohm
Lead Channel Impedance Value: 513 Ohm
Lead Channel Impedance Value: 627 Ohm
Lead Channel Pacing Threshold Amplitude: 0.875 V
Lead Channel Pacing Threshold Amplitude: 0.875 V
Lead Channel Pacing Threshold Pulse Width: 0.4 ms
Lead Channel Pacing Threshold Pulse Width: 0.4 ms
Lead Channel Sensing Intrinsic Amplitude: 15.375 mV
Lead Channel Sensing Intrinsic Amplitude: 15.375 mV
Lead Channel Sensing Intrinsic Amplitude: 2.375 mV
Lead Channel Sensing Intrinsic Amplitude: 2.375 mV
Lead Channel Setting Pacing Amplitude: 2 V
Lead Channel Setting Pacing Amplitude: 2.5 V
Lead Channel Setting Pacing Pulse Width: 0.4 ms
Lead Channel Setting Sensing Sensitivity: 2.8 mV

## 2017-07-13 ENCOUNTER — Telehealth: Payer: Self-pay | Admitting: Family Medicine

## 2017-07-13 NOTE — Telephone Encounter (Signed)
Patient Name: Derrick [name]  DOB: Dec 18, 1941    Initial Comment caller states he has a sore throat and a headache. When he swallows food its getting stuck . He is traveling next week and is concerned because he has autoimmune issues .   Nurse Assessment  Nurse: Jimmey Ralph, RN, Lissa Date/Time (Eastern Time): 07/13/2017 3:09:31 PM  Confirm and document reason for call. If symptomatic, describe symptoms. ---caller states he has a sore throat in past 24 hours and a headache. When he swallows food its getting stuck . He is traveling next week and is concerned because he has autoimmune issues . History of sarcodosis, no respirtatory symtpms. Elevated IGM protein and inflamitory issues.  Does the patient have any new or worsening symptoms? ---Yes  Will a triage be completed? ---Yes  Related visit to physician within the last 2 weeks? ---No  Does the PT have any chronic conditions? (i.e. diabetes, asthma, etc.) ---No  Is this a behavioral health or substance abuse call? ---No     Guidelines    Guideline Title Affirmed Question Affirmed Notes  Sore Throat SEVERE (e.g., excruciating) throat pain    Final Disposition User   See Physician within 24 Hours Hammonds, RN, Lissa    Referrals  REFERRED TO PCP OFFICE   Disagree/Comply: Comply

## 2017-07-13 NOTE — Telephone Encounter (Signed)
Noted  

## 2017-07-13 NOTE — Telephone Encounter (Signed)
Pt has appt to see Allie Bossier NP 07/14/17 at 10 AM.

## 2017-07-14 ENCOUNTER — Ambulatory Visit (INDEPENDENT_AMBULATORY_CARE_PROVIDER_SITE_OTHER): Payer: Medicare Other | Admitting: Primary Care

## 2017-07-14 ENCOUNTER — Encounter: Payer: Self-pay | Admitting: Primary Care

## 2017-07-14 VITALS — BP 120/76 | HR 62 | Temp 99.1°F | Ht 70.0 in | Wt 197.8 lb

## 2017-07-14 DIAGNOSIS — J029 Acute pharyngitis, unspecified: Secondary | ICD-10-CM

## 2017-07-14 DIAGNOSIS — I25810 Atherosclerosis of coronary artery bypass graft(s) without angina pectoris: Secondary | ICD-10-CM | POA: Diagnosis not present

## 2017-07-14 LAB — POCT RAPID STREP A (OFFICE): Rapid Strep A Screen: NEGATIVE

## 2017-07-14 NOTE — Progress Notes (Signed)
Subjective:    Patient ID: Derrick [name], male    DOB: 27-Aug-1941, 76 y.o.   MRN: [id number]  HPI  [name] is a 76 year old male with a history of asthma, allergic rhinitis, GERD who presents today with a chief complaint of sore throat. His soreness is mostly located to the right posterior pharynx. He has difficulty swallowing due to the pain. He has noticed low grade fevers. His symptoms began about three days. He's not taken anything OTC for his symptoms.   He denies cough, congestion, ear pain, esophageal burning, post nasal drip.  Review of Systems  Constitutional: Positive for chills, fatigue and fever.  HENT: Positive for sore throat. Negative for congestion, ear pain and sinus pressure.   Respiratory: Negative for cough and shortness of breath.        Past Medical History:  Diagnosis Date  . BENIGN PROSTATIC HYPERTROPHY, WITH OBSTRUCTION 05/28/2010  . CAD (coronary artery disease)    a. s/p NSTEMI 06/01/11: DES to RCA;  b. cath 06/25/11:   dLM 50-60% (FFR 0.87), prox to mid LAD 40-50%, D1 50%, pCFX 50%, RCA stent ok, dPDA 80%, EF 55-60%.  His FFR was felt to be negative and therefore medical therapy was recommended ;  echo 6/12: EF 55-60%, mild AS   . Chronic back pain   . CIDP (chronic inflammatory demyelinating polyneuropathy) ([place]) 04/11/2012  . Colon cancer ([facility]) dx'd 2000   "left"  . COLONIC POLYPS, ADENOMATOUS, HX OF 05/28/2010  . Complication of anesthesia    "stopped breathing; related to my sleep apnea"   . COPD (chronic obstructive pulmonary disease) ([place])    "associated w/lung sarcoidosis"  . 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 DISH (diffuse idiopathic skeletal hyperostosis) 12/02/2015  . Diverticulitis   . GENERALIZED OSTEOARTHROSIS UNSPECIFIED SITE 05/28/2010  . GERD 05/28/2010  . Heart murmur   . History of gout   . HYPERLIPIDEMIA 05/28/2010  . Hypertension   . IgM lambda paraproteinemia   . Intrinsic asthma, 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   "associated w/lung sarcoidosis"    . Malignant neoplasm of descending colon ([name]) 05/28/2010  . MITRAL VALVE PROLAPSE 05/28/2010  . 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 NSTEMI (non-ST elevated myocardial infarction) ([facility]) 06/03/11  . 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  . OSA on CPAP 05/28/2010  . PALPITATIONS, CHRONIC 05/28/2010  . Parotid gland pain 2010   infection  . Peripheral neuropathy    "tx'd w/targeted chemo" (05/12/2016)  . Pneumonia 2-3 times  . Presence of permanent cardiac pacemaker   . 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 Sarcoidosis    pulmonalis  . UNSPECIFIED INFLAMMATORY AND TOXIC NEUROPATHY 05/28/2010  . Vision loss   . VITAMIN D DEFICIENCY 05/28/2010     Social History   Social History  . Marital status: Married    Spouse name: N/A  . Number of children: 2  . Years of education: MS-Coll.   Occupational History  . Consultant     Chemical Engineer-former   Social History Main Topics  . Smoking status: Never Smoker  . Smokeless tobacco: Never Used  . Alcohol use 8.4 oz/week    14 Glasses of wine per week     Comment: 05/12/2016 "10oz wine qd"  . Drug use: No  . Sexual activity: Yes    Birth control/ protection: None   Other Topics Concern  . Not on file   Social History Narrative   College- [employer]; [employer]-environment science and mgt. Married Derrick [name])  "  28" 1 son- 71; 1 dtr "70" 2 grandchildren. 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 End of life-provided discussive context and provided packet.    Past Surgical History:  Procedure Laterality Date  . APPENDECTOMY  1954  . CATARACT EXTRACTION W/ INTRAOCULAR LENS IMPLANT Left   . COLON SURGERY  2000   decending colon   . CORONARY ANGIOPLASTY WITH STENT PLACEMENT  06/03/11  . ELECTROPHYSIOLOGIC STUDY N/A 05/12/2016   Procedure: Cardioversion;  Surgeon: [name], MD;  Location: [name];  Service: Cardiovascular;  Laterality: N/A;  . EP IMPLANTABLE DEVICE N/A 05/12/2016   Procedure:  Pacemaker Implant;  Surgeon: [name], MD;  Location: [facility];  Service: Cardiovascular;  Laterality: N/A;  . INSERT / REPLACE / REMOVE PACEMAKER  05/12/2016  . KNEE ARTHROSCOPY Right 2003  . LEFT HEART CATHETERIZATION WITH CORONARY ANGIOGRAM N/A 03/31/2015   Procedure: LEFT HEART CATHETERIZATION WITH CORONARY ANGIOGRAM;  Surgeon: [name], MD;  Location: [facility];  Service: Cardiovascular;  Laterality: N/A;  . LUNG SURGERY  2001   "open lung dissection"  . MOHS SURGERY Left ~ 2008   ear  . PROSTATE BIOPSY  2010  . TONSILLECTOMY  1950    Family History  Problem Relation Age of Onset  . Arthritis Mother        severe  . Coronary artery disease Mother   . Colon polyps Mother   . Heart disease Mother   . Coronary artery disease Brother   . Cancer Brother        choriocarcinoma  . Arrhythmia Brother 46       Afib/Tachycardia    No Known Allergies  Current Outpatient Prescriptions on File Prior to Visit  Medication Sig Dispense Refill  . albuterol (PROVENTIL HFA;VENTOLIN HFA) 108 (90 BASE) MCG/ACT inhaler Inhale 2 puffs into the lungs every 6 (six) hours as needed for wheezing or shortness of breath.    Derrick [name] amLODipine (NORVASC) 10 MG tablet Take 1 tablet (10 mg total) by mouth daily. 90 tablet 3  . atorvastatin (LIPITOR) 20 MG tablet TAKE ONE TABLET BY MOUTH ONCE DAILY 90 tablet 1  . bimatoprost (LUMIGAN) 0.01 % SOLN Place 1 drop into both eyes at bedtime.    . budesonide (PULMICORT) 180 MCG/ACT inhaler Inhale 2 puffs into the lungs 2 (two) times daily. 3 each 3  . CHOLECALCIFEROL PO Take 1,000 mg by mouth daily. Vitamin D 3    . dorzolamide-timolol (COSOPT) 22.3-6.8 MG/ML ophthalmic solution Place 1 drop into both eyes 2 (two) times daily.     Derrick [name] dronedarone (MULTAQ) 400 MG tablet Take 1 tablet (400 mg total) by mouth 2 (two) times daily with a meal. 180 tablet 3  . ELIQUIS 5 MG TABS tablet TAKE ONE TABLET BY MOUTH TWICE DAILY 180 tablet 1  . finasteride  (PROSCAR) 5 MG tablet Take 5 mg by mouth daily.     . fluticasone (FLONASE) 50 MCG/ACT nasal spray Place 2 sprays into both nostrils 2 (two) times daily.    Derrick [name] gabapentin (NEURONTIN) 800 MG tablet Take 1 tablet (800 mg total) by mouth 3 (three) times daily. 270 tablet 0  . pantoprazole (PROTONIX) 40 MG tablet TAKE ONE TABLET BY MOUTH ONCE DAILY 90 tablet 1  . tadalafil (CIALIS) 5 MG tablet Take 5 mg by mouth daily. Reported on 05/26/2016    . triamterene-hydrochlorothiazide (MAXZIDE-25) 37.5-25 MG tablet Take 1 tablet by mouth daily. 90 tablet 3   No current facility-administered  medications on file prior to visit.     BP 120/76   Pulse 62   Temp 99.1 F (37.3 C) (Oral)   Ht 5\' 10"  (1.778 m)   Wt 197 lb 12.8 oz (89.7 kg)   SpO2 97%   BMI 28.38 kg/m    Objective:   Physical Exam  Constitutional: He appears well-nourished. He does not appear ill.  HENT:  Right Ear: Tympanic membrane and ear canal normal.  Left Ear: Tympanic membrane and ear canal normal.  Nose: No mucosal edema. Right sinus exhibits no maxillary sinus tenderness and no frontal sinus tenderness. Left sinus exhibits no maxillary sinus tenderness and no frontal sinus tenderness.  Mouth/Throat: Oropharynx is clear and moist.  Eyes: Conjunctivae are normal.  Neck: Neck supple.  Cardiovascular: Normal rate and regular rhythm.   Pulmonary/Chest: Effort normal and breath sounds normal. He has no wheezes. He has no rales.  Lymphadenopathy:    He has no cervical adenopathy.  Skin: Skin is warm and dry.          Assessment & Plan:  Sore Throat:  Pain with painful swallowing x 3 days. Exam today without evidence of strep, lesions, foreign body. He appears well today, no other symptoms. Exam unremarkable. Rapid Strep: Negative. Could be viral, allergy,.  Recommended he start a regimen of tylenol every 6 hours as needed for pain. Discussed warm salt gargles. If no improvement in 3-4 days would recommend ENT  evaluation.

## 2017-07-14 NOTE — Addendum Note (Signed)
Addended by: Jacqualin Combes on: 07/14/2017 11:35 AM   Modules accepted: Orders

## 2017-07-14 NOTE — Patient Instructions (Signed)
Your strep test is negative for bacterial infection.  Start tylenol tablets for pain. Take 650 mg every 4-6 hours as needed for pain. Try warm salt gargles every 4-6 hours as needed for pain.   Please notify us Monday next week if you start to run fevers greater than 101, notice increased pain, notice swelling.  It was a pleasure meeting you!

## 2017-07-17 DIAGNOSIS — J029 Acute pharyngitis, unspecified: Secondary | ICD-10-CM | POA: Diagnosis not present

## 2017-07-18 ENCOUNTER — Telehealth: Payer: Self-pay | Admitting: *Deleted

## 2017-07-18 DIAGNOSIS — G5602 Carpal tunnel syndrome, left upper limb: Secondary | ICD-10-CM | POA: Diagnosis not present

## 2017-07-18 DIAGNOSIS — M1812 Unilateral primary osteoarthritis of first carpometacarpal joint, left hand: Secondary | ICD-10-CM | POA: Diagnosis not present

## 2017-07-18 DIAGNOSIS — G5601 Carpal tunnel syndrome, right upper limb: Secondary | ICD-10-CM | POA: Diagnosis not present

## 2017-07-18 NOTE — Telephone Encounter (Signed)
Please call patient, did he go to Urgent Care or seek treatment over the weekend? How's he feeling?

## 2017-07-18 NOTE — Telephone Encounter (Signed)
[facility] Patient Name: Derrick [name] Gender: Male DOB: [birth year] Age: 76 Y 48 M 15 D Return Phone Number: [phone] (Primary), [phone] (Secondary) City/State/Zip: [place] [address] Client [facility] - Client Client Site [facility] - Day Physician [name] - MD Who Is Calling Patient / Member / Family / Caregiver Call Type Triage / Clinical Caller Name Fedor Kazmierski Relationship To Patient Spouse Return Phone Number [phone] (Primary) Chief Complaint Swallowing Difficulty Reason for Call Symptomatic / Request for [place] states husband saw NP Thursday for sore throat, getting worse. Having pain and difficulty swallowing. Appointment Disposition EMR Appointment Not Necessary Info pasted into Epic No Nurse Assessment Nurse: Verlin Fester RN, [facility] Date/Time Eilene Ghazi Time): 07/16/2017 7:19:49 AM Confirm and document reason for call. If symptomatic, describe symptoms. ---Patient states he has a bad sore throat and he was seen on Thursday and told he did not have strep throat. Today he is having trouble swallowing Does the PT have any chronic conditions? (i.e. diabetes, asthma, etc.) ---Yes List chronic conditions. ---"sarcoidosis, pacemaker, A-fib, MGUS Guidelines Guideline Title Affirmed Question Sore Throat [1] Drooling or spitting out saliva (because can't swallow) AND [2] normal breathing Disp. Time Eilene Ghazi Time) Disposition Final User 07/16/2017 7:27:34 AM Go to ED Now Yes Verlin Fester, RN, [facility] - ED Care Advice Given Per Guideline GO TO ED NOW: You need to be seen in the Emergency Department. Go to the ER at ___________ [place] now. Drive carefully. BRING MEDICINES: * Please bring a list of your current medicines when you go to the Emergency Department (ER). * It is  also a good idea to bring the pill bottles too. This will help the doctor to make certain you are taking the right medicines and the right dose. CARE ADVICE given per Sore Throat (Adult) guideline.

## 2017-07-18 NOTE — Telephone Encounter (Signed)
Noted  

## 2017-07-18 NOTE — Telephone Encounter (Signed)
Patient did go to UC and says he is feeling significantly better.

## 2017-07-21 ENCOUNTER — Ambulatory Visit: Payer: Medicare Other | Admitting: Neurology

## 2017-07-24 ENCOUNTER — Other Ambulatory Visit: Payer: Self-pay | Admitting: Cardiovascular Disease

## 2017-07-24 DIAGNOSIS — I4891 Unspecified atrial fibrillation: Secondary | ICD-10-CM

## 2017-07-25 ENCOUNTER — Other Ambulatory Visit: Payer: Medicare Other

## 2017-07-25 DIAGNOSIS — D472 Monoclonal gammopathy: Secondary | ICD-10-CM | POA: Diagnosis not present

## 2017-07-25 NOTE — Telephone Encounter (Signed)
Pt saw Dr Burt Knack 03/2017. SCr 03/2017 1.01. Age 76 yrs, St 89.7Kg. Will refill Eliquis 5mg  BID.

## 2017-07-26 LAB — KAPPA/LAMBDA LIGHT CHAINS
Ig Kappa Free Light Chain: 22.8 mg/L — ABNORMAL HIGH (ref 3.3–19.4)
Ig Lambda Free Light Chain: 15.2 mg/L (ref 5.7–26.3)
Kappa/Lambda FluidC Ratio: 1.5 (ref 0.26–1.65)

## 2017-07-26 LAB — BETA 2 MICROGLOBULIN, SERUM: Beta-2: 1.5 mg/L (ref 0.6–2.4)

## 2017-07-27 LAB — MULTIPLE MYELOMA PANEL, SERUM
Albumin SerPl Elph-Mcnc: 3.6 g/dL (ref 2.9–4.4)
Albumin/Glob SerPl: 1.3 (ref 0.7–1.7)
Alpha 1: 0.3 g/dL (ref 0.0–0.4)
Alpha2 Glob SerPl Elph-Mcnc: 0.8 g/dL (ref 0.4–1.0)
B-Globulin SerPl Elph-Mcnc: 0.9 g/dL (ref 0.7–1.3)
Gamma Glob SerPl Elph-Mcnc: 1 g/dL (ref 0.4–1.8)
Globulin, Total: 3 g/dL (ref 2.2–3.9)
IgA, Qn, Serum: 183 mg/dL (ref 61–437)
IgG, Qn, Serum: 758 mg/dL (ref 700–1600)
IgM, Qn, Serum: 319 mg/dL — ABNORMAL HIGH (ref 15–143)
M Protein SerPl Elph-Mcnc: 0.2 g/dL — ABNORMAL HIGH
Total Protein: 6.6 g/dL (ref 6.0–8.5)

## 2017-07-29 ENCOUNTER — Other Ambulatory Visit: Payer: Self-pay | Admitting: Hematology and Oncology

## 2017-07-29 ENCOUNTER — Telehealth: Payer: Self-pay

## 2017-07-29 DIAGNOSIS — C186 Malignant neoplasm of descending colon: Secondary | ICD-10-CM

## 2017-07-29 DIAGNOSIS — D472 Monoclonal gammopathy: Secondary | ICD-10-CM

## 2017-07-29 NOTE — Telephone Encounter (Signed)
Called patient per Dr. Alvy Bimler. Lab appt canceled for 8-6. Patient will come in on 8-8 at 2 pm to see Dr. Alvy Bimler. Scheduling message sent.

## 2017-08-01 ENCOUNTER — Telehealth: Payer: Self-pay | Admitting: *Deleted

## 2017-08-01 ENCOUNTER — Telehealth: Payer: Self-pay

## 2017-08-01 ENCOUNTER — Other Ambulatory Visit: Payer: Medicare Other

## 2017-08-01 NOTE — Telephone Encounter (Signed)
Pt called that his appts are wrong. Per note on Friday the next appt is 8/8 at 2, which is not showing. The pt is seeing 8/21 at 2.

## 2017-08-01 NOTE — Telephone Encounter (Signed)
I did not see any appt made I can see him on 8/21 at 2 pm, 15 mins, please schedule if OK with patient

## 2017-08-01 NOTE — Telephone Encounter (Signed)
Pt will see Dr Alvy Bimler on Wednesday, 8/8 @ 1130 .  Message to scheduler

## 2017-08-03 ENCOUNTER — Ambulatory Visit (HOSPITAL_BASED_OUTPATIENT_CLINIC_OR_DEPARTMENT_OTHER): Payer: Medicare Other | Admitting: Hematology and Oncology

## 2017-08-03 ENCOUNTER — Telehealth: Payer: Self-pay | Admitting: Hematology and Oncology

## 2017-08-03 VITALS — BP 123/71 | HR 79 | Temp 98.8°F | Resp 20 | Ht 70.0 in | Wt 190.8 lb

## 2017-08-03 DIAGNOSIS — D472 Monoclonal gammopathy: Secondary | ICD-10-CM

## 2017-08-03 DIAGNOSIS — G6181 Chronic inflammatory demyelinating polyneuritis: Secondary | ICD-10-CM | POA: Diagnosis not present

## 2017-08-03 NOTE — Telephone Encounter (Signed)
Scheduled appt per 8/8 los - Gave patient AVS and calender per los. 

## 2017-08-05 ENCOUNTER — Encounter: Payer: Self-pay | Admitting: Hematology and Oncology

## 2017-08-05 NOTE — Assessment & Plan Note (Signed)
IgM level is within normal limits and M spike is stable. He is not symptomatic apart from his neuropathy I recommend observation only with history, physical examination and blood work in 12 months.

## 2017-08-05 NOTE — Progress Notes (Signed)
[facility] OFFICE PROGRESS NOTE  Patient Care Team: [name], MD as PCP - General (Family Medicine) Heath Lark, MD as Consulting Physician (Hematology and Oncology) [name], MD as Consulting Physician (Cardiology)  SUMMARY OF ONCOLOGIC HISTORY:  History of CIDP and IgM monoclonal gammopathy  HISTORY OF PRESENTING ILLNESS:  Derrick [name] was transferred to my care after his prior physician has left.  I reviewed the patient's records extensive and collaborated the history with the patient. 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 He was initially evaluated and treated by a hematologist and a neurologist in [place] before he moved to [place]. 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 He had bone marrow aspirate and biopsy which showed no evidence of malignancy in his bone marrow. His neurologic condition is characterized primarily by painful distal neuropathy, feet worse than hands, and lower extremity weakness. At one point he could barely walk a block without having to stop due to pain and weakness. He was given an initial trial of Rituxan in 2010 with no significant improvement. However following reinitiation of Rituxan in April 2012 he did get a progressive improvement. He was put on a maintenance program which we have continued in this office on an every other month basis through 03/23/2013.  The patient have new onset atrial fibrillation. His echocardiogram shows significant dilated atrium without evidence of increase ventricular wall thickness. 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 INTERVAL HISTORY: Please see below for problem oriented charting. He is currently on chronic anticoagulation therapy due to history of atrial  fibrillation and bradycardia requiring pacemaker implantation. The patient denies any recent signs or symptoms of bleeding such as spontaneous epistaxis, hematuria or hematochezia. He denies worsening neuropathy. He has persistent balance issues. He continues to have arthritis pain in his hands He had recent throat infection/sore throat for almost 1 month, treated with prednisone and Augmentin He also had recent prostatitis After antibiotic treatment, he had complications with "extreme" diarrhea, resolved  REVIEW OF SYSTEMS:   Constitutional: Denies fevers, chills or abnormal weight loss Eyes: Denies blurriness of vision Ears, nose, mouth, throat, and face: Denies mucositis or sore throat Respiratory: Denies cough, dyspnea or wheezes Cardiovascular: Denies palpitation, chest discomfort or lower extremity swelling Gastrointestinal:  Denies nausea, heartburn or change in bowel habits Skin: Denies abnormal skin rashes Lymphatics: Denies new lymphadenopathy or easy bruising Behavioral/Psych: Mood is stable, no new changes  All other systems were reviewed with the patient and are negative.  I have reviewed the past medical history, past surgical history, social history and family history with the patient and they are unchanged from previous note.  ALLERGIES:  has No Known Allergies.  MEDICATIONS:  Current Outpatient Prescriptions  Medication Sig Dispense Refill  . albuterol (PROVENTIL HFA;VENTOLIN HFA) 108 (90 BASE) MCG/ACT inhaler Inhale 2 puffs into the lungs every 6 (six) hours as needed for wheezing or shortness of breath.    [name] amLODipine (NORVASC) 10 MG tablet Take 1 tablet (10 mg total) by mouth daily. 90 tablet 3  . atorvastatin (LIPITOR) 20 MG tablet TAKE ONE TABLET BY MOUTH ONCE DAILY 90 tablet 1  . bimatoprost (LUMIGAN) 0.01 % SOLN Place 1 drop into both eyes at bedtime.    . budesonide (PULMICORT) 180 MCG/ACT inhaler Inhale 2 puffs into the  lungs 2 (two) times daily. 3 each 3  .  CHOLECALCIFEROL PO Take 1,000 mg by mouth daily. Vitamin D 3    . dorzolamide-timolol (COSOPT) 22.3-6.8 MG/ML ophthalmic solution Place 1 drop into both eyes 2 (two) times daily.     [name] dronedarone (MULTAQ) 400 MG tablet Take 1 tablet (400 mg total) by mouth 2 (two) times daily with a meal. 180 tablet 3  . ELIQUIS 5 MG TABS tablet TAKE 1 TABLET BY MOUTH TWICE DAILY 180 tablet 2  . finasteride (PROSCAR) 5 MG tablet Take 5 mg by mouth daily.     . fluticasone (FLONASE) 50 MCG/ACT nasal spray Place 2 sprays into both nostrils 2 (two) times daily.    [name] gabapentin (NEURONTIN) 800 MG tablet Take 1 tablet (800 mg total) by mouth 3 (three) times daily. 270 tablet 0  . pantoprazole (PROTONIX) 40 MG tablet TAKE ONE TABLET BY MOUTH ONCE DAILY 90 tablet 1  . tadalafil (CIALIS) 5 MG tablet Take 5 mg by mouth daily. Reported on 05/26/2016    . triamterene-hydrochlorothiazide (MAXZIDE-25) 37.5-25 MG tablet Take 1 tablet by mouth daily. 90 tablet 3   No current facility-administered medications for this visit.     PHYSICAL EXAMINATION: ECOG PERFORMANCE STATUS: 1 - Symptomatic but completely ambulatory  Vitals:   08/03/17 1121  BP: 123/71  Pulse: 79  Resp: 20  Temp: 98.8 F (37.1 C)  SpO2: 98%   Filed Weights   08/03/17 1121  Weight: 190 lb 12.8 oz (86.5 kg)    GENERAL:alert, no distress and comfortable SKIN: skin color, texture, turgor are normal, no rashes or significant lesions EYES: normal, Conjunctiva are pink and non-injected, sclera clear OROPHARYNX:no exudate, no erythema and lips, buccal mucosa, and tongue normal  NECK: supple, thyroid normal size, non-tender, without nodularity LYMPH:  no palpable lymphadenopathy in the cervical, axillary or inguinal LUNGS: clear to auscultation and percussion with normal breathing effort HEART: regular rate & rhythm and no murmurs and no lower extremity edema ABDOMEN:abdomen soft, non-tender and normal bowel sounds Musculoskeletal:no cyanosis of  digits and no clubbing  NEURO: alert & oriented x 3 with fluent speech, no focal motor/sensory deficits  LABORATORY DATA:  I have reviewed the data as listed    Component Value Date/Time   NA 139 03/29/2017 1011   NA 141 07/27/2016 0748   K 3.9 03/29/2017 1011   K 3.9 07/27/2016 0748   CL 100 03/29/2017 1011   CL 104 03/23/2013 0855   CO2 32 03/29/2017 1011   CO2 30 (H) 07/27/2016 0748   GLUCOSE 74 03/29/2017 1011   GLUCOSE 122 07/27/2016 0748   GLUCOSE 98 03/23/2013 0855   BUN 19 03/29/2017 1011   BUN 16.0 07/27/2016 0748   CREATININE 1.01 03/29/2017 1011   CREATININE 1.0 07/27/2016 0748   CALCIUM 10.2 03/29/2017 1011   CALCIUM 10.5 (H) 07/27/2016 0748   PROT 6.6 07/25/2017 0745   PROT 7.5 07/27/2016 0748   ALBUMIN 4.4 04/04/2017 1405   ALBUMIN 4.3 07/27/2016 0748   AST 17 04/04/2017 1405   AST 24 07/27/2016 0748   ALT 15 04/04/2017 1405   ALT 19 07/27/2016 0748   ALKPHOS 54 04/04/2017 1405   ALKPHOS 53 07/27/2016 0748   BILITOT 0.4 04/04/2017 1405   BILITOT 0.84 07/27/2016 0748   GFRNONAA >60 06/05/2011 0350   GFRAA >60 06/05/2011 0350    No results found for: SPEP, UPEP  Lab Results  Component Value Date   WBC 5.1 03/29/2017  NEUTROABS 3.0 03/29/2017   HGB 13.9 03/29/2017   HCT 40.3 03/29/2017   MCV 90.1 03/29/2017   PLT 196.0 03/29/2017      Chemistry      Component Value Date/Time   NA 139 03/29/2017 1011   NA 141 07/27/2016 0748   K 3.9 03/29/2017 1011   K 3.9 07/27/2016 0748   CL 100 03/29/2017 1011   CL 104 03/23/2013 0855   CO2 32 03/29/2017 1011   CO2 30 (H) 07/27/2016 0748   BUN 19 03/29/2017 1011   BUN 16.0 07/27/2016 0748   CREATININE 1.01 03/29/2017 1011   CREATININE 1.0 07/27/2016 0748      Component Value Date/Time   CALCIUM 10.2 03/29/2017 1011   CALCIUM 10.5 (H) 07/27/2016 0748   ALKPHOS 54 04/04/2017 1405   ALKPHOS 53 07/27/2016 0748   AST 17 04/04/2017 1405   AST 24 07/27/2016 0748   ALT 15 04/04/2017 1405   ALT 19  07/27/2016 0748   BILITOT 0.4 04/04/2017 1405   BILITOT 0.84 07/27/2016 0748       ASSESSMENT & PLAN:  MGUS (monoclonal gammopathy of unknown significance) IgM level is within normal limits and M spike is stable. He is not symptomatic apart from his neuropathy I recommend observation only with history, physical examination and blood work in 12 months.  CIDP (chronic inflammatory demyelinating polyneuropathy) This is stable.  He also had polyarthritis including possibility of ankylosing spondylitis which may respond to rituximab treatment although I have no clinical experience on this. His last skeletal survey showed he had DISH I would defer his management to his neurologist He was referred to see a rheumatologist in the past   Orders Placed This Encounter  Procedures  . CBC with Differential/Platelet    Standing Status:   Future    Standing Expiration Date:   09/07/2018  . Comprehensive metabolic panel    Standing Status:   Future    Standing Expiration Date:   09/07/2018  . Kappa/lambda light chains    Standing Status:   Future    Standing Expiration Date:   09/07/2018  . Multiple Myeloma Panel (SPEP&IFE w/QIG)    Standing Status:   Future    Standing Expiration Date:   09/07/2018   All questions were answered. The patient knows to call the clinic with any problems, questions or concerns. No barriers to learning was detected. I spent 15 minutes counseling the patient face to face. The total time spent in the appointment was 20 minutes and more than 50% was on counseling and review of test results     [place] Lark, MD 08/05/2017 3:08 PM

## 2017-08-05 NOTE — Assessment & Plan Note (Signed)
This is stable.  He also had polyarthritis including possibility of ankylosing spondylitis which may respond to rituximab treatment although I have no clinical experience on this. His last skeletal survey showed he had DISH I would defer his management to his neurologist He was referred to see a rheumatologist in the past

## 2017-08-06 ENCOUNTER — Other Ambulatory Visit: Payer: Self-pay | Admitting: Neurology

## 2017-08-06 ENCOUNTER — Other Ambulatory Visit: Payer: Self-pay | Admitting: Cardiovascular Disease

## 2017-08-06 DIAGNOSIS — I4891 Unspecified atrial fibrillation: Secondary | ICD-10-CM

## 2017-08-07 ENCOUNTER — Encounter: Payer: Self-pay | Admitting: Hematology and Oncology

## 2017-08-11 DIAGNOSIS — Z23 Encounter for immunization: Secondary | ICD-10-CM | POA: Diagnosis not present

## 2017-08-16 ENCOUNTER — Ambulatory Visit: Payer: Medicare Other | Admitting: Hematology and Oncology

## 2017-09-08 DIAGNOSIS — E663 Overweight: Secondary | ICD-10-CM | POA: Diagnosis not present

## 2017-09-08 DIAGNOSIS — M79671 Pain in right foot: Secondary | ICD-10-CM | POA: Diagnosis not present

## 2017-09-08 DIAGNOSIS — Z6828 Body mass index (BMI) 28.0-28.9, adult: Secondary | ICD-10-CM | POA: Diagnosis not present

## 2017-09-08 DIAGNOSIS — M150 Primary generalized (osteo)arthritis: Secondary | ICD-10-CM | POA: Diagnosis not present

## 2017-09-08 DIAGNOSIS — M79672 Pain in left foot: Secondary | ICD-10-CM | POA: Diagnosis not present

## 2017-09-08 DIAGNOSIS — M5489 Other dorsalgia: Secondary | ICD-10-CM | POA: Diagnosis not present

## 2017-09-21 ENCOUNTER — Encounter: Payer: Self-pay | Admitting: Internal Medicine

## 2017-09-21 ENCOUNTER — Telehealth: Payer: Self-pay | Admitting: *Deleted

## 2017-09-21 ENCOUNTER — Ambulatory Visit (INDEPENDENT_AMBULATORY_CARE_PROVIDER_SITE_OTHER): Payer: Medicare Other | Admitting: Internal Medicine

## 2017-09-21 VITALS — BP 132/70 | HR 84 | Ht 70.0 in | Wt 196.0 lb

## 2017-09-21 DIAGNOSIS — I25810 Atherosclerosis of coronary artery bypass graft(s) without angina pectoris: Secondary | ICD-10-CM

## 2017-09-21 DIAGNOSIS — Z1211 Encounter for screening for malignant neoplasm of colon: Secondary | ICD-10-CM

## 2017-09-21 DIAGNOSIS — K219 Gastro-esophageal reflux disease without esophagitis: Secondary | ICD-10-CM | POA: Diagnosis not present

## 2017-09-21 DIAGNOSIS — Z85038 Personal history of other malignant neoplasm of large intestine: Secondary | ICD-10-CM

## 2017-09-21 DIAGNOSIS — Z86010 Personal history of colonic polyps: Secondary | ICD-10-CM | POA: Diagnosis not present

## 2017-09-21 DIAGNOSIS — Z7901 Long term (current) use of anticoagulants: Secondary | ICD-10-CM

## 2017-09-21 MED ORDER — SUPREP BOWEL PREP KIT 17.5-3.13-1.6 GM/177ML PO SOLN: 1.0000 | ORAL | 0 refills | Status: DC

## 2017-09-21 NOTE — Telephone Encounter (Signed)
Patient with diagnosis of atrial fibrillation on Eliquis for anticoagulation.    Procedure: colonoscopy Date of procedure: 11/08/2017  CHADS2 score of 2 (HTN, AGE);  CHADS2-VASc score of  3 (HTN, AGE, CAD)  CrCl 81 ml/min Platelet count 196  Per office protocol, patient can hold Eliquis for 2 days prior to procedure.    Patient should restart Eliquis on the evening of procedure or day after, at discretion of procedure MD

## 2017-09-21 NOTE — Progress Notes (Signed)
Subjective:    Patient ID: Derrick [name], male    DOB: [birth year], 76 y.o.   MRN: [id number]  HPI  Derrick [name] is a 76 year old male with a past medical history of descending colon cancer status post resection, history of adenomatous colon polyps, GERD without Barrett's esophagus, CAD, atrial fibrillation on Eliquis, status post pacemaker placement, mild aortic stenosis, MGUS and CIDP who is seen in follow-up. He reports he has been feeling well. He is here alone today.  He has some intermittent mild left middle to upper quadrant abdominal discomfort. This is been present on and off since hiscolon cancer surgery. He feels this is related to bowel gas and also bowel movements. This has not changed and is not worsen. He takes Electronics engineer daily which is regulate his bowel movements and helps prevent fecal urgency which was worsened by the use of Multaq.  No blood in his stool or melena. No diarrhea or significant constipation. No upper GI complaint and heartburn is well-controlled with pantoprazole. No dysphagia, odynophagia, nausea or vomiting.  He remains very active, he retired earlier this year. He is traveling frequently and has a trip coming up next month to [place] and [place].  He is now on Eliquis daily without aspirin therapy. He previously was on Plavix which is also been discontinued.  His last colonoscopy was 3 years ago where he had 4 small tubular adenomas removed. There was a prior anastomosis in the left colon which was unremarkable.  Review of Systems As per history of present illness, otherwise negative  Current Medications, Allergies, Past Medical History, Past Surgical History, Family History and Social History were reviewed in [employer] record.     Objective:   Physical Exam BP 132/70   Pulse 84   Ht 5\' 10"  (1.778 m)   Wt 196 lb (88.9 kg)   BMI 28.12 kg/m  Constitutional: Well-developed and well-nourished. No distress. HEENT: Normocephalic and  atraumatic. Oropharynx is clear and moist. Conjunctivae are normal.  No scleral icterus. Neck: Neck supple. Trachea midline. Cardiovascular: Normal rate, regular rhythm and intact distal pulses. 2/6 SEM Pulmonary/chest: Effort normal and breath sounds normal. No wheezing, rales or rhonchi. Abdominal: Soft, nontender, nondistended. Bowel sounds active throughout. There are no masses palpable. No hepatosplenomegaly. Extremities: no clubbing, cyanosis, or edema Neurological: Alert and oriented to person place and time. Skin: Skin is warm and dry. Psychiatric: Normal mood and affect. Behavior is normal.      Assessment & Plan:  76 year old male with a past medical history of descending colon cancer status post resection, history of adenomatous colon polyps, GERD without Barrett's esophagus, CAD, atrial fibrillation on Eliquis, status post pacemaker placement, mild aortic stenosis, MGUS and CIDP who is seen in follow-up.  1. History of colon cancer/history of adenomatous colon polyps -- he is due surveillance colonoscopy at this time. We discussed the risks, benefits and alternatives and he is agreeable to proceed. --Will hold Eliquis 2 days prior to endoscopic procedures - will instruct when and how to resume after procedure. Benefits and risks of procedure explained including risks of bleeding, perforation, infection, missed lesions, reactions to medications and possible need for hospitalization and surgery for complications. Additional rare but real risk of stroke or other vascular clotting events off Eliquis also explained and need to seek urgent help if any signs of these problems occur. Will communicate by phone or EMR with patient's  prescribing provider (Dr. Curt Bears) to confirm that holding Eliquis is reasonable in this case.  2. GERD -- well-controlled without alarm symptoms. EGD without Barrett's esophagus 3 years ago. We discussed risk, benefits and alternatives chronic PPI use and he wishes  to continue therapy. Continue pantoprazole 40 mg daily  25 minutes spent with the patient today. Greater than 50% was spent in counseling and coordination of care with the patient

## 2017-09-21 NOTE — Telephone Encounter (Signed)
   Derrick [name] August 06, 1941 [id number]  Dear Dr Melodye Ped:  We have scheduled the above named patient for a(n) colonoscopy procedure. Our records show that (s)he is on anticoagulation therapy.  Please advise as to whether the patient may come off their therapy of Eliquis 2 days prior to their procedure which is scheduled for 11/08/17.  Please route your response to Dixon Boos, CMA.  Sincerely,  [facility]

## 2017-09-21 NOTE — Telephone Encounter (Signed)
Patient advised that per cardiology, he may hold Eliquis 2 days prior to his procedure. May restart at discretion of procedure MD. Patient verbalizes understanding.

## 2017-09-21 NOTE — Patient Instructions (Signed)
You have been scheduled for a colonoscopy. Please follow written instructions given to you at your visit today.  Please pick up your prep supplies at the pharmacy within the next 1-3 days. If you use inhalers (even only as needed), please bring them with you on the day of your procedure. Your physician has requested that you go to www.startemmi.com and enter the access code given to you at your visit today. This web site gives a general overview about your procedure. However, you should still follow specific instructions given to you by our office regarding your preparation for the procedure.  You will be contaced by our office prior to your procedure for directions on holding your Eliquis.  If you do not hear from our office 1 week prior to your scheduled procedure, please call [phone] to discuss.  If you are age 74 or older, your body mass index should be between 23-30. Your Body mass index is 28.12 kg/m. If this is out of the aforementioned range listed, please consider follow up with your Primary Care Provider.  If you are age 97 or younger, your body mass index should be between 19-25. Your Body mass index is 28.12 kg/m. If this is out of the aformentioned range listed, please consider follow up with your Primary Care Provider.

## 2017-09-25 NOTE — Progress Notes (Signed)
Electrophysiology Office Note   Date:  09/26/2017   ID:  Derrick [name], DOB [birth year], MRN [id number]  PCP:  [name], MD  Cardiologist:  Burt Knack Primary Electrophysiologist:  Jalesha Plotz [name], MD    Chief Complaint  Patient presents with  . Pacemaker Check    Persistent Afib     History of Present Illness: Derrick [name] is a 76 y.o. male who presents today for electrophysiology evaluation.   He has been followed for coronary artery disease. He has a history of myocardial infarction and drug-eluting stent placement in the right coronary artery. 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 He's been managed medically. He has multiple noncardiac problems including CIDP, pulmonary sarcoid, and monoclonal gammopathy of undetermined significance. He last underwent cardiac catheterization in April 2016 showing stable coronary anatomy. He had a pacemaker implanted for tachybradycardia syndrome on 05/12/16. He has been maintained on Multaq since that time with maintenance of sinus rhythm.   Today, denies symptoms of palpitations, chest pain, shortness of breath, orthopnea, PND, lower extremity edema, claudication, dizziness, presyncope, syncope, bleeding, or neurologic sequela. The patient is tolerating medications without difficulties. He has been feeling well without major complaint. He has not noted any further episodes of atrial fibrillation. He did have some palpitations 6 weeks ago that occurred around the time that he had normal virus. Otherwise he is felt well.   Past Medical History:  Diagnosis Date  . BENIGN PROSTATIC HYPERTROPHY, WITH OBSTRUCTION 05/28/2010  . CAD (coronary artery disease)    a. s/p NSTEMI 06/01/11: DES to RCA;  b. cath 06/25/11:   dLM 50-60% (FFR 0.87), prox to mid LAD 40-50%, D1 50%, pCFX 50%, RCA stent ok, dPDA 80%, EF 55-60%.  His FFR was felt to be negative and therefore medical therapy was recommended  ;  echo 6/12: EF 55-60%, mild AS   . Chronic back pain   . CIDP (chronic inflammatory demyelinating polyneuropathy) ([place]) 04/11/2012  . Colon cancer ([place]) dx'd 2000   "left"  . COLONIC POLYPS, ADENOMATOUS, HX OF 05/28/2010  . Complication of anesthesia    "stopped breathing; related to my sleep apnea"   . COPD (chronic obstructive pulmonary disease) ([place])    "associated w/lung sarcoidosis"  . 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 DISH (diffuse idiopathic skeletal hyperostosis) 12/02/2015  . Diverticulitis   . GENERALIZED OSTEOARTHROSIS UNSPECIFIED SITE 05/28/2010  . GERD 05/28/2010  . Heart murmur   . History of gout   . HYPERLIPIDEMIA 05/28/2010  . Hypertension   . IgM lambda paraproteinemia   . 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 Malignant neoplasm of descending colon ([place]) 05/28/2010  . MITRAL VALVE PROLAPSE 05/28/2010  . 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 NSTEMI (non-ST elevated myocardial infarction) ([place]) 06/03/11  . 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  . OSA on CPAP 05/28/2010  . PALPITATIONS, CHRONIC 05/28/2010  . Parotid gland pain 2010   infection  . Peripheral neuropathy    "tx'd w/targeted chemo" (05/12/2016)  . Pneumonia 2-3 times  . Presence of permanent cardiac pacemaker   . 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 Sarcoidosis    pulmonalis  . UNSPECIFIED INFLAMMATORY AND TOXIC NEUROPATHY 05/28/2010  . Vision loss   . VITAMIN D DEFICIENCY 05/28/2010   Past Surgical History:  Procedure Laterality Date  . APPENDECTOMY  1954  . CATARACT EXTRACTION W/ INTRAOCULAR LENS IMPLANT Left   . COLON SURGERY  2000  decending colon   . CORONARY ANGIOPLASTY WITH STENT PLACEMENT  06/03/11  . ELECTROPHYSIOLOGIC STUDY N/A 05/12/2016   Procedure: Cardioversion;  Surgeon: Shaunda Tipping [name], MD;  Location: [place] [facility];  Service: Cardiovascular;  Laterality: N/A;  . EP IMPLANTABLE DEVICE N/A 05/12/2016   Procedure:  Pacemaker Implant;  Surgeon: Romeo Zielinski [name], MD;  Location: [place] [facility];  Service: Cardiovascular;  Laterality: N/A;  . INSERT / REPLACE / REMOVE PACEMAKER  05/12/2016  . KNEE ARTHROSCOPY Right 2003  . LEFT HEART CATHETERIZATION WITH CORONARY ANGIOGRAM N/A 03/31/2015   Procedure: LEFT HEART CATHETERIZATION WITH CORONARY ANGIOGRAM;  Surgeon: [name], MD;  Location: [facility] C[facility];  Service: Cardiovascular;  Laterality: N/A;  . LUNG SURGERY  2001   "open lung dissection"  . MOHS SURGERY Left ~ 2008   ear  . PROSTATE BIOPSY  2010  . TONSILLECTOMY  1950     Current Outpatient Prescriptions  Medication Sig Dispense Refill  . albuterol (PROVENTIL HFA;VENTOLIN HFA) 108 (90 BASE) MCG/ACT inhaler Inhale 2 puffs into the lungs every 6 (six) hours as needed for wheezing or shortness of breath.    [name] amLODipine (NORVASC) 10 MG tablet Take 1 tablet (10 mg total) by mouth daily. 90 tablet 3  . atorvastatin (LIPITOR) 20 MG tablet TAKE 1 TABLET BY MOUTH ONCE DAILY 90 tablet 2  . bimatoprost (LUMIGAN) 0.01 % SOLN Place 1 drop into both eyes at bedtime.    . budesonide (PULMICORT) 180 MCG/ACT inhaler Inhale 2 puffs into the lungs 2 (two) times daily. (Patient taking differently: Inhale 2 puffs into the lungs 2 (two) times daily as needed. ) 3 each 3  . CHOLECALCIFEROL PO Take 1,000 mg by mouth daily. Vitamin D 3    . dorzolamide-timolol (COSOPT) 22.3-6.8 MG/ML ophthalmic solution Place 1 drop into both eyes 2 (two) times daily.     [name] dronedarone (MULTAQ) 400 MG tablet Take 1 tablet (400 mg total) by mouth 2 (two) times daily with a meal. 180 tablet 3  . ELIQUIS 5 MG TABS tablet TAKE 1 TABLET BY MOUTH TWICE DAILY 180 tablet 2  . finasteride (PROSCAR) 5 MG tablet Take 5 mg by mouth daily.     . fluticasone (FLONASE) 50 MCG/ACT nasal spray Place 2 sprays into both nostrils 2 (two) times daily.    [name] gabapentin (NEURONTIN) 800 MG tablet TAKE 1 TABLET BY MOUTH THREE TIMES DAILY 270 tablet 0  .  pantoprazole (PROTONIX) 40 MG tablet TAKE 1 TABLET BY MOUTH ONCE DAILY 90 tablet 2  . predniSONE (DELTASONE) 5 MG tablet Take 5 mg by mouth daily with breakfast. Tapered dose. Pt [place] d/c October 4th 2018    . tadalafil (CIALIS) 5 MG tablet Take 5 mg by mouth daily. Reported on 05/26/2016    . triamterene-hydrochlorothiazide (MAXZIDE-25) 37.5-25 MG tablet Take 1 tablet by mouth daily. 90 tablet 3   No current facility-administered medications for this visit.     Allergies:   Patient has no known allergies.   Social History:  The patient  reports that he has never smoked. He has never used smokeless tobacco. He reports that he drinks about 8.4 oz of alcohol per week . He reports that he does not use drugs.   Family History:  The patient's family history includes Arrhythmia (age of onset: 58) in his brother; Arthritis in his mother; Cancer in his brother; Colon polyps in his mother; Coronary artery disease in his brother and mother; Heart disease in his  mother.    ROS:  Please see the history of present illness.   Otherwise, review of systems is positive for chest pain, palpitations, leg pain, hearing loss, back pain, rash, easy bruising.   All other systems are reviewed and negative.   PHYSICAL EXAM: VS:  BP 120/70   Pulse 79   Ht 5\' 10"  (1.778 m)   Wt 194 lb 3.2 oz (88.1 kg)   BMI 27.86 kg/m  , BMI Body mass index is 27.86 kg/m. GEN: Well nourished, well developed, in no acute distress  HEENT: normal  Neck: no JVD, carotid bruits, or masses Cardiac: RRR; no murmurs, rubs, or gallops,no edema  Respiratory:  clear to auscultation bilaterally, normal work of breathing GI: soft, nontender, nondistended, + BS MS: no deformity or atrophy  Skin: warm and dry, device site well healed Neuro:  Strength and sensation are intact Psych: euthymic mood, full affect  EKG:  EKG is ordered today. Personal review of the ekg ordered shows AV paced  Personal review of the device interrogation today.  Results in [place]: 03/29/2017: BUN 19; Creatinine, Ser 1.01; Hemoglobin 13.9; Platelets 196.0; Potassium 3.9; Sodium 139; TSH 3.02 04/04/2017: ALT 15    Lipid Panel     Component Value Date/Time   CHOL 164 04/04/2017 1405   TRIG 85 04/04/2017 1405   HDL 57 04/04/2017 1405   CHOLHDL 2.9 04/04/2017 1405   CHOLHDL 2.7 03/19/2016 0922   VLDL 14 03/19/2016 0922   LDLCALC 90 04/04/2017 1405     Wt Readings from Last 3 Encounters:  09/26/17 194 lb 3.2 oz (88.1 kg)  09/21/17 196 lb (88.9 kg)  08/03/17 190 lb 12.8 oz (86.5 kg)      Other studies Reviewed: Additional studies/ records that were reviewed today include: 03/31/15 Cath, 04/13/16 TTE, 03/31/16 Holter Review of the above records today demonstrates:   Final Conclusions:  1. Moderate distal left mainstem disease, without significant progression from the patient's 2012 cardiac catheterization study 2. Mild to moderate diffusely calcified stenosis of the LAD 3. Mild nonobstructive stenosis of the left circumflex 4. Continued stent patency of the RCA with moderate PDA stenosis 5. Preserved LV systolic function  - Left ventricle: The cavity size was normal. Wall thickness was  normal. Systolic function was vigorous. The estimated ejection  fraction was in the range of 65% to 70%. Wall motion was normal;  there were no regional wall motion abnormalities. The study is  not technically sufficient to allow evaluation of LV diastolic  function. - Aortic valve: Trileaflet; mildly calcified leaflets. Mild  stenosis. Mean gradient (S): 12 mm Hg. Peak gradient (S): 22 mm  Hg. Valve area (VTI): 2.58 cm^2. Valve area (Vmax): 2.92 cm^2.  Valve area (Vmean): 2.91 cm^2. - Aorta: Ascending aortic diameter: 41 mm (S). - Ascending aorta: The ascending aorta is dilated. - Mitral valve: Mildly thickened leaflets . There was mild  regurgitation. - Left atrium: Severely dilated at 57 ml/m2. - Right ventricle: The cavity  size was mildly dilated. - Right atrium: Severely dilated at 27 cm2. - Tricuspid valve: There was mild regurgitation. - Pulmonary arteries: PA peak pressure: 35 mm Hg (S). - Inferior vena cava: The vessel was dilated. The respirophasic  diameter changes were blunted (< 50%), consistent with elevated  central venous pressure.   The basic rhythm is atrial fibrillation  There are aberrated beats or PVC's and a short run of wide-complex tachycardia, suspect AF with aberrancy  There are episodes of  slow atrial fibrillation   ASSESSMENT AND PLAN:  1.  Atrial fibrillation: Is on Eliquis and Multaq. Remains in sinus rhythm with atrial pacing intermittently. No changes at this time.  This patients CHA2DS2-VASc Score and unadjusted Ischemic Stroke Rate (% per year) is equal to 3.2 % stroke rate/year from a score of 3  Above score calculated as 1 point each if present [CHF, HTN, DM, Vascular=MI/PAD/Aortic Plaque, Age if 65-74, or Male] Above score calculated as 2 points each if present [Age > 75, or Stroke/TIA/TE]   2. Hypertension: Well-controlled. No changes.  3. Hyperlipidemia: Continue statin  4. Coronary artery disease: Currently no chest pain. Continue current management.  5. Sick sinus syndrome: Medtronic dual chamber pacemaker implanted 05/12/16. Functioning appropriately. No changes.  Current medicines are reviewed at length with the patient today.   The patient does not have concerns regarding his medicines.  The following changes were made today:  none  Labs/ tests ordered today include:  Orders Placed This Encounter  Procedures  . EKG 12-Lead     Disposition:   FU with Kiandria Clum 6 months  Signed, Kdyn Vonbehren [name], MD  09/26/2017 11:22 AM     [facility] [address] [place] [address] [phone] (office) [phone] (fax)

## 2017-09-26 ENCOUNTER — Ambulatory Visit (INDEPENDENT_AMBULATORY_CARE_PROVIDER_SITE_OTHER): Payer: Medicare Other | Admitting: *Deleted

## 2017-09-26 ENCOUNTER — Ambulatory Visit (INDEPENDENT_AMBULATORY_CARE_PROVIDER_SITE_OTHER): Payer: Medicare Other | Admitting: Cardiology

## 2017-09-26 ENCOUNTER — Encounter: Payer: Medicare Other | Admitting: *Deleted

## 2017-09-26 ENCOUNTER — Encounter: Payer: Self-pay | Admitting: Cardiology

## 2017-09-26 VITALS — BP 120/70 | HR 79 | Ht 70.0 in | Wt 194.2 lb

## 2017-09-26 DIAGNOSIS — I10 Essential (primary) hypertension: Secondary | ICD-10-CM | POA: Diagnosis not present

## 2017-09-26 DIAGNOSIS — I25118 Atherosclerotic heart disease of native coronary artery with other forms of angina pectoris: Secondary | ICD-10-CM

## 2017-09-26 DIAGNOSIS — R001 Bradycardia, unspecified: Secondary | ICD-10-CM

## 2017-09-26 DIAGNOSIS — I25810 Atherosclerosis of coronary artery bypass graft(s) without angina pectoris: Secondary | ICD-10-CM

## 2017-09-26 DIAGNOSIS — I481 Persistent atrial fibrillation: Secondary | ICD-10-CM

## 2017-09-26 DIAGNOSIS — Z950 Presence of cardiac pacemaker: Secondary | ICD-10-CM

## 2017-09-26 DIAGNOSIS — I4819 Other persistent atrial fibrillation: Secondary | ICD-10-CM

## 2017-09-26 DIAGNOSIS — E785 Hyperlipidemia, unspecified: Secondary | ICD-10-CM

## 2017-09-26 DIAGNOSIS — I209 Angina pectoris, unspecified: Secondary | ICD-10-CM

## 2017-09-26 DIAGNOSIS — Z79899 Other long term (current) drug therapy: Secondary | ICD-10-CM

## 2017-09-26 LAB — CUP PACEART INCLINIC DEVICE CHECK
Battery Remaining Longevity: 92 mo
Battery Voltage: 3.01 V
Brady Statistic AP VP Percent: 97.29 %
Brady Statistic AP VS Percent: 0.03 %
Brady Statistic AS VP Percent: 2.67 %
Brady Statistic AS VS Percent: 0.01 %
Brady Statistic RA Percent Paced: 97.19 %
Brady Statistic RV Percent Paced: 99.7 %
Date Time Interrogation Session: 20181001140313
Implantable Lead Implant Date: 20170517
Implantable Lead Implant Date: 20170517
Implantable Lead Location: 753859
Implantable Lead Location: 753860
Implantable Lead Model: 5076
Implantable Lead Model: 5076
Implantable Pulse Generator Implant Date: 20170517
Lead Channel Impedance Value: 399 Ohm
Lead Channel Impedance Value: 494 Ohm
Lead Channel Impedance Value: 513 Ohm
Lead Channel Impedance Value: 665 Ohm
Lead Channel Pacing Threshold Amplitude: 0.75 V
Lead Channel Pacing Threshold Amplitude: 0.75 V
Lead Channel Pacing Threshold Pulse Width: 0.4 ms
Lead Channel Pacing Threshold Pulse Width: 0.4 ms
Lead Channel Sensing Intrinsic Amplitude: 12.875 mV
Lead Channel Sensing Intrinsic Amplitude: 2.625 mV
Lead Channel Setting Pacing Amplitude: 2 V
Lead Channel Setting Pacing Amplitude: 2.5 V
Lead Channel Setting Pacing Pulse Width: 0.4 ms
Lead Channel Setting Sensing Sensitivity: 2.8 mV

## 2017-09-26 NOTE — Progress Notes (Signed)
Remote pacemaker transmission.   

## 2017-09-26 NOTE — Patient Instructions (Signed)
Medication Instructions:  Your physician recommends that you continue on your current medications as directed. Please refer to the Current Medication list given to you today.  -- If you need a refill on your cardiac medications before your next appointment, please call your pharmacy. --  Labwork: None ordered  Testing/Procedures: None ordered  Follow-Up: Remote monitoring is used to monitor your Pacemaker of ICD from home. This monitoring reduces the number of office visits required to check your device to one time per year. It allows [place] to keep an eye on the functioning of your device to ensure it is working properly. You are scheduled for a device check from home on 12/26/2017. You may send your transmission at any time that day. If you have a wireless device, the transmission will be sent automatically. After your physician reviews your transmission, you will receive a postcard with your next transmission date.  Your physician wants you to follow-up in: 6 months with Dr. Curt Bears.  You will receive a reminder letter in the mail two months in advance. If you don't receive a letter, please call our office to schedule the follow-up appointment.  Thank you for choosing [facility]!!   Trinidad Curet, RN [phone]  Any Other Special Instructions Will Be Listed Below (If Applicable).

## 2017-09-27 LAB — CUP PACEART REMOTE DEVICE CHECK
Battery Remaining Longevity: 92 mo
Battery Voltage: 3.01 V
Brady Statistic AP VP Percent: 96.08 %
Brady Statistic AP VS Percent: 0.04 %
Brady Statistic AS VP Percent: 3.87 %
Brady Statistic AS VS Percent: 0 %
Brady Statistic RA Percent Paced: 95.96 %
Brady Statistic RV Percent Paced: 99.69 %
Date Time Interrogation Session: 20181001132546
Implantable Lead Implant Date: 20170517
Implantable Lead Implant Date: 20170517
Implantable Lead Location: 753859
Implantable Lead Location: 753860
Implantable Lead Model: 5076
Implantable Lead Model: 5076
Implantable Pulse Generator Implant Date: 20170517
Lead Channel Impedance Value: 399 Ohm
Lead Channel Impedance Value: 494 Ohm
Lead Channel Impedance Value: 513 Ohm
Lead Channel Impedance Value: 665 Ohm
Lead Channel Pacing Threshold Amplitude: 0.75 V
Lead Channel Pacing Threshold Amplitude: 0.875 V
Lead Channel Pacing Threshold Pulse Width: 0.4 ms
Lead Channel Pacing Threshold Pulse Width: 0.4 ms
Lead Channel Sensing Intrinsic Amplitude: 1.5 mV
Lead Channel Sensing Intrinsic Amplitude: 1.5 mV
Lead Channel Sensing Intrinsic Amplitude: 16.75 mV
Lead Channel Sensing Intrinsic Amplitude: 16.75 mV
Lead Channel Setting Pacing Amplitude: 1.75 V
Lead Channel Setting Pacing Amplitude: 2.5 V
Lead Channel Setting Pacing Pulse Width: 0.4 ms
Lead Channel Setting Sensing Sensitivity: 2.8 mV

## 2017-09-28 ENCOUNTER — Encounter: Payer: Self-pay | Admitting: Cardiology

## 2017-10-03 ENCOUNTER — Ambulatory Visit: Payer: Medicare Other | Admitting: Cardiovascular Disease

## 2017-10-16 ENCOUNTER — Encounter: Payer: Self-pay | Admitting: Neurology

## 2017-10-17 DIAGNOSIS — H524 Presbyopia: Secondary | ICD-10-CM | POA: Diagnosis not present

## 2017-10-17 DIAGNOSIS — H5211 Myopia, right eye: Secondary | ICD-10-CM | POA: Diagnosis not present

## 2017-10-17 DIAGNOSIS — H401112 Primary open-angle glaucoma, right eye, moderate stage: Secondary | ICD-10-CM | POA: Diagnosis not present

## 2017-10-17 DIAGNOSIS — H401122 Primary open-angle glaucoma, left eye, moderate stage: Secondary | ICD-10-CM | POA: Diagnosis not present

## 2017-10-18 ENCOUNTER — Encounter: Payer: Self-pay | Admitting: Neurology

## 2017-10-18 ENCOUNTER — Ambulatory Visit (INDEPENDENT_AMBULATORY_CARE_PROVIDER_SITE_OTHER): Payer: Medicare Other | Admitting: Neurology

## 2017-10-18 VITALS — BP 134/81 | HR 69 | Ht 70.0 in | Wt 194.0 lb

## 2017-10-18 DIAGNOSIS — G63 Polyneuropathy in diseases classified elsewhere: Secondary | ICD-10-CM

## 2017-10-18 DIAGNOSIS — G4731 Primary central sleep apnea: Secondary | ICD-10-CM | POA: Diagnosis not present

## 2017-10-18 DIAGNOSIS — D472 Monoclonal gammopathy: Secondary | ICD-10-CM

## 2017-10-18 DIAGNOSIS — I472 Ventricular tachycardia, unspecified: Secondary | ICD-10-CM

## 2017-10-18 DIAGNOSIS — I4729 Other ventricular tachycardia: Secondary | ICD-10-CM

## 2017-10-18 DIAGNOSIS — I25810 Atherosclerosis of coronary artery bypass graft(s) without angina pectoris: Secondary | ICD-10-CM

## 2017-10-18 DIAGNOSIS — Z950 Presence of cardiac pacemaker: Secondary | ICD-10-CM

## 2017-10-18 DIAGNOSIS — R001 Bradycardia, unspecified: Secondary | ICD-10-CM | POA: Diagnosis not present

## 2017-10-18 DIAGNOSIS — G4739 Other sleep apnea: Secondary | ICD-10-CM

## 2017-10-18 NOTE — Progress Notes (Signed)
[facility]  Provider:  Larey Seat, M D  Referring Provider: Janith Lima, MD Primary Care Physician:  [name], MD  Chief Complaint  Patient presents with  . Follow-up    pt alone, rm 10. [facility] DME. pt states all is well with CPAP.    MGUS neuropathy, ankylsing spondylitis and OSA/ sarcoidosis.   HPI:      Danilo Cappiello is a 76 y.o. male , caucasian of french, [place]  and native- [place] ( [place] decent .  Is seen here as a revisit  from Dr. [name] . His PCP is  Dr. Ronnald Ramp , his ophthalmologist is Dr. [name].   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  He had been for at least 12 years CPAP treatment at a pressure of 16 cm water,  he was also using a full face mask at the time , which was comfortable.  His original diagnostic sleep study took place in 2000. The patient endorsed  Epworth sleepiness score at the of 24 points in the FSS at an elevated rate of 42 points as well. His BMI at the time was 30.  Meanwhile he had lost 65 pounds.  Mr. Gervase  underwent a sleep study on 11-30-2011. His AHI at time of study was 42.1 and his RDI 45.5 his REM AHI was 30 and non-REM 42.9 he did not have any supine sleep. He had oxygen desaturations to a nadir of 82% and had a total desaturation time of 52.6 minutes with a the first 3 hours of sleep.The results were indicative of severe sleep apnea and  the patient was titrated to CPAP on 12-05-2011 at the pressure of only 8 cm. He had REM rebound at this setting ,the residual AHI in the night was 0.3 per hour. He also recommended the use of a nasal pillow of possible. The  CPAP was download it in 2013 after 90 days of CPAP he was with a new pressure. He had 100% compliance rate and residual AHI of 3.4 with  only moderate to mild air leak that pressure was 8 cm water. There are peak time per day was 8 hours and 48 minutes. CPAP still set at 8 cm water with  3 cm EPR.  The air leak has slightly increased the residual AHI is 2.2,  even better than last year. The  therapeutic time is 8 hour and 53 minutes.  The patient has 100% compliance. Today he endorsed Epworth sleepiness score at 0 point and the fatigue severity score at only 15 points.  Geriatric depression score was endorsed at he only scored 1 point not suggestive of clinical depression.  Interval history ; Mr. Henson has also a monoclonal gammopathy and has been treated with rituximab the medication had continuously improved his exercise tolerance, finally he was able to walk 30-40 minutes a day.  He has been exercising daily.  He did develop not neuropathic pain,  but some and knee pain on the right and an He has comMRI was now showing DDD on the correspondin spinal level.  Completed his rituximab treatment with Dr. [name]. in February 2014 .He underwent a total of 3  steroid injections at the L5-S1 distribution which had meanwhile shown a benefit.  He has been on gabapentin for  neuropathic pain, seems to be related to his MGUS diagnosis .He had developed a severe cold intolerance progressively actually the last year.  He has noticed since March 2014  and since his Retuximab treatment was completed, that he has some cold cramping as well and toe cramps,  pain and numbness.   Last Time I ordered NCS and EMG.   Dr Jannifer Franklin did a EMG and NCS, and found axonal damage , not a finding of CIDP.  This was after 3 years of MGUS treatment and I answered his questions, "did I ever have CIDP'? I believe ,yes. 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  The patient has hp pain but is ambulatory, he periodically loses control of his  foot lifting muscles - stretching exercises help. As to his obstructive sleep apnea, there is no need for any adjustment at this time. His current settings are comfortable and has residual AHI is very low. In office download confirmed high compliance 02-13-14 . He is extremely intolerant to cold temperature, and has anhydrosis - this is more often seen in dysautonomia with NP.  He was identified as axonal neuropathy by Dr. Jannifer Franklin. I would like to follow the patient once a year for the CPAP exclusively for sleep - RV in 12 month . The CPAP  compliance report for 10-16-14 reveals a AHI of  1.2 and daily user time of  9 hours and 14 minutes, and 100% over 4 hour compliance over 4 hours of nightly use.  He has spinal stenosis - 4 times daily 400 mg neurontin.  A revisit will be scheduled in 12 months. Labs , CBC and Diff by oncologist. Cold agglutinin ordered. Plasma -Electrophoresis reordered.   03-26-15  Interval history [name] reports that he was recently babysitting the grandchildren in [place] and has noticed that he has less stamina less strength especially in his proximal leg muscles, and he was just yesterday told that he had an abnormal cardiac stress test. He has known coronary artery disease and a stent in the right main coronary artery he was now diagnosed with coronary artery disease in the left main. Catheterization is planned for Monday. As to his obstructive sleep apnea he is doing very well his compliance report from 03-26-15 shows 97% compliance for days of use and for 4 hours of use the average therapy time is 8 hours and 39 minutes at 9 cm water pressure with 2 cm EPR, residual AHI is 1.7. Fatigue severity score is 23 and Epworth sleepiness score is 1. the patient recently has had some desire to take some daytime naps which she has not had for a long time or ever. Back from [place] , he noted a black toe nail, without history of trauma. Was this related to blood clot? Its only the  nail , not the whole toe. It is tender to touch. 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   Interval history from 10/18/2017, I have the pleasure of seeing Mr. 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 He has followed up with sinus practitioner Cecille Rubin.The patient has been a compliant CPAP use rand today's download confirms this with 100% compliance rate up until October 21, 9 hours 8 minutes of average nocturnal use, said pressure is 9 cm water with 2 cm EPR residual AHI is 4.8  partially due to air leaks. There are some residual obstructive apneas noted. There is no treatment emergent central apnea noted. Mr. Ovens needs a new CPAP machine which I would like to be an auto titrate capable CPAP.  Further developments : Ventricular tachycardia. Aortic stenosis. CAD. Cardiologist is Dr Burt Knack, electrophysiologist Dr. Garlan Fillers. MGUS- Neuropathy with falls and stumbling, gabapentin controls pain.  His Epworth score is endorsed at one point his fatigue severity at 29 points and his depression score at 1 out of 15 points.    Review of Systems: Out of a complete 14 system review, the patient complains of only the following symptoms, and all other reviewed systems are negative. The patient no longer has any residual daytime sleepiness, his sleep on CPAP has been restorative. He describes no nocturia.  He reports ongoing problems probably worsening problems with his hands " falling asleep" and  feeling tingly and numb.  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    Social History   Social History  . Marital status: Married    Spouse name: N/A  . Number of children: 2  . Years of education: MS-Coll.   Occupational History  . Consultant     Chemical Engineer-former   Social History Main Topics  . Smoking status: Never Smoker  . Smokeless tobacco: Never  Used  . Alcohol use 8.4 oz/week    14 Glasses of wine per week     Comment: 05/12/2016 "10oz wine qd"  . Drug use: No  . Sexual activity: Yes    Birth control/ protection: None   Other Topics Concern  . Not on file   Social History Narrative   College- [employer]; [employer]-environment science and mgt. Married [name])  "77" 1 son- 73; 1 dtr "90" 2 grandchildren. 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 End of life-provided discussive context and provided packet.    Family History  Problem Relation Age of Onset  . Arthritis Mother        severe  . Coronary artery disease Mother   . Colon polyps Mother   . Heart disease Mother   . Coronary artery disease Brother   . Cancer Brother        choriocarcinoma  . Arrhythmia Brother 22       Afib/Tachycardia    Past Medical History:  Diagnosis Date  . BENIGN PROSTATIC HYPERTROPHY, WITH OBSTRUCTION 05/28/2010  . CAD (coronary artery disease)    a. s/p NSTEMI 06/01/11: DES to RCA;  b. cath 06/25/11:   dLM 50-60% (FFR 0.87), prox to mid LAD 40-50%, D1 50%, pCFX 50%, RCA stent ok, dPDA 80%, EF 55-60%.  His FFR was felt to be negative and therefore medical therapy was recommended ;  echo 6/12: EF 55-60%, mild AS   . Chronic back pain   . CIDP (chronic inflammatory demyelinating polyneuropathy) ([place]) 04/11/2012  . Colon cancer ([place]) dx'd 2000   "left"  . COLONIC POLYPS, ADENOMATOUS, HX OF 05/28/2010  . Complication of anesthesia    "stopped breathing; related to my sleep apnea"   . COPD (chronic obstructive pulmonary disease) ([place])    "associated w/lung sarcoidosis"  . 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 DISH (diffuse idiopathic skeletal hyperostosis) 12/02/2015  . Diverticulitis   . GENERALIZED OSTEOARTHROSIS UNSPECIFIED SITE 05/28/2010  . GERD 05/28/2010  . Heart murmur   . History of gout   . HYPERLIPIDEMIA 05/28/2010  . Hypertension   . IgM lambda paraproteinemia   .  Intrinsic asthma, 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   "associated w/lung  sarcoidosis"  . Malignant neoplasm of descending colon ([place]) 05/28/2010  . MITRAL VALVE PROLAPSE 05/28/2010  . 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 NSTEMI (non-ST elevated myocardial infarction) ([place]) 06/03/11  . 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  . OSA on CPAP 05/28/2010  . PALPITATIONS, CHRONIC 05/28/2010  . Parotid gland pain 2010   infection  . Peripheral neuropathy    "tx'd w/targeted chemo" (05/12/2016)  . Pneumonia 2-3 times  . Presence of permanent cardiac pacemaker   . 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 Sarcoidosis    pulmonalis  . UNSPECIFIED INFLAMMATORY AND TOXIC NEUROPATHY 05/28/2010  . Vision loss   . VITAMIN D DEFICIENCY 05/28/2010    Past Surgical History:  Procedure Laterality Date  . APPENDECTOMY  1954  . CATARACT EXTRACTION W/ INTRAOCULAR LENS IMPLANT Left   . COLON SURGERY  2000   decending colon   . CORONARY ANGIOPLASTY WITH STENT PLACEMENT  06/03/11  . ELECTROPHYSIOLOGIC STUDY N/A 05/12/2016   Procedure: Cardioversion;  Surgeon: [name], MD;  Location: [facility];  Service: Cardiovascular;  Laterality: N/A;  . EP IMPLANTABLE DEVICE N/A 05/12/2016   Procedure: Pacemaker Implant;  Surgeon: [name], MD;  Location: [facility];  Service: Cardiovascular;  Laterality: N/A;  . INSERT / REPLACE / REMOVE PACEMAKER  05/12/2016  . KNEE ARTHROSCOPY Right 2003  . LEFT HEART CATHETERIZATION WITH CORONARY ANGIOGRAM N/A 03/31/2015   Procedure: LEFT HEART CATHETERIZATION WITH CORONARY ANGIOGRAM;  Surgeon: [name], MD;  Location: [facility] C[facility];  Service: Cardiovascular;  Laterality: N/A;  . LUNG SURGERY  2001   "open lung dissection"  . MOHS SURGERY Left ~ 2008   ear  . PROSTATE BIOPSY  2010  . TONSILLECTOMY  1950    Current Outpatient Prescriptions  Medication Sig Dispense Refill  . albuterol (PROVENTIL HFA;VENTOLIN HFA) 108 (90 BASE) MCG/ACT inhaler Inhale 2 puffs into the lungs every 6 (six) hours as needed  for wheezing or shortness of breath.    [name] amLODipine (NORVASC) 10 MG tablet Take 1 tablet (10 mg total) by mouth daily. 90 tablet 3  . atorvastatin (LIPITOR) 20 MG tablet TAKE 1 TABLET BY MOUTH ONCE DAILY 90 tablet 2  . bimatoprost (LUMIGAN) 0.01 % SOLN Place 1 drop into both eyes at bedtime.    . budesonide (PULMICORT) 180 MCG/ACT inhaler Inhale 2 puffs into the lungs 2 (two) times daily. (Patient taking differently: Inhale 2 puffs into the lungs 2 (two) times daily as needed. ) 3 each 3  . CHOLECALCIFEROL PO Take 1,000 mg by mouth daily. Vitamin D 3    . dorzolamide-timolol (COSOPT) 22.3-6.8 MG/ML ophthalmic solution Place 1 drop into both eyes 2 (two) times daily.     [name] dronedarone (MULTAQ) 400 MG tablet Take 1 tablet (400 mg total) by mouth 2 (two) times daily with a meal. 180 tablet 3  . ELIQUIS 5 MG TABS tablet TAKE 1 TABLET BY MOUTH TWICE DAILY 180 tablet 2  . finasteride (PROSCAR) 5 MG tablet Take 5 mg by mouth daily.     . fluticasone (FLONASE) 50 MCG/ACT nasal spray Place 2 sprays into both nostrils 2 (two) times daily.    [name] gabapentin (NEURONTIN) 800 MG tablet TAKE 1 TABLET BY MOUTH THREE TIMES DAILY 270 tablet 0  . pantoprazole (PROTONIX) 40 MG tablet TAKE 1 TABLET BY MOUTH ONCE DAILY 90 tablet 2  . tadalafil (CIALIS) 5 MG tablet Take  5 mg by mouth daily. Reported on 05/26/2016    . triamterene-hydrochlorothiazide (MAXZIDE-25) 37.5-25 MG tablet Take 1 tablet by mouth daily. 90 tablet 3   No current facility-administered medications for this visit.     Allergies as of 10/18/2017  . (No Known Allergies)    Vitals: BP 134/81   Pulse 69   Ht 5\' 10"  (1.778 m)   Wt 194 lb (88 kg)   BMI 27.84 kg/m  Last Weight:  Wt Readings from Last 1 Encounters:  10/18/17 194 lb (88 kg)   Last Height:   Ht Readings from Last 1 Encounters:  10/18/17 5\' 10"  (1.778 m)    Physical exam:  General: The patient is awake, alert and appears not in acute distress. The patient is well  groomed. Head: Normocephalic, atraumatic. Neck is supple.  Mallampati 1, neck circumference: 16.75 " No lymph node swelling, no parotid swelling.  Cardiovascular: irregular rate and rhythm, with mild syst. murmurs ,translated left carotid bruit, and without distended neck veins. Respiratory: Lungs are clear to auscultation. Skin:  Without evidence of edema, or rash Trunk: BMI is reduced, the patient lost over 60 pounds over last 24 month, as a result of cardiac rehabilitation. Neurologic exam : The patient is awake and alert, oriented to place and time.   Memory subjective described as intact. There is a normal attention span & concentration ability.  Speech is fluent without  dysarthria, dysphonia or aphasia. Mood and affect are appropriate.  Cranial nerves: Pupils are equal and briskly reactive to light. Extraocular movements  in vertical and horizontal planes intact and without nystagmus. Visual fields by finger perimetry are intact. Hearing to finger rub intact.  Facial sensation intact to fine touch. Facial motor strength is symmetric and tongue and uvula move midline.  Motor exam:   Normal tone and normal muscle bulk and symmetric normal strength in all extremities.  Good grip strength is noted.   He has a foot drop on the left foot, that is slowly, steadily progressed since 2013.   Sensory:  Fine touch, pinprick and vibration were decreased-. The patient has hyperesthesia in both planta pedis.  There is a slight decrease of vibratory sense at the ankle level, but the patient reports profound numbness - He also has lost proprioception , more recently this seems to to some degree affect his hands. Coordination:  Finger-to-nose maneuver tested and normal without evidence of ataxia, dysmetria or tremor. Gait and station: Patient walks without assistive device and is able and assisted stool climb up to the exam table. Strength within normal limits.  Stance is stable and normal. Tandem gait  is fragmented, he needs to look where he steps, lost his " sense of where in space " he is . Romberg testing is positive. Deep tendon reflexes: in the  upper and lower extremities are attenuated , he has a mild foot drop in the right - Babinski maneuver response is downgoing.   Assessment:  Rituximab has reduced the patient's arthritic symptoms and has certainly beneficial effects on ankylosing spondylitis, the MG [place] may well be associated with these autoimmune disorders at baseline.  His neuropathy also did not progress during the rituximab treatment. His last treatment was given in April  2014 and he is now on a " drug holiday" for over 3 years .   He continues to be monitored by his hematologist-oncologist Dr. Standley Dakins, MD- next appointment May 2019 .  He has ankylosing spondylitis and axonal neuropathy will likely progress  if he is not treated with a specific immune modulation.  His hyperpathia, hyperesthesias and dyseasthesias have increased.   OSA- CPAP continuously needed, given his cardiac problems- and he has for years shown 100% compliance.  I will ordere a new machine , this one has become a fire hazard. He is on medicare - may need to proof the ongoing presence of apnea to get new machine.  [facility]- very unhappy with the service.  I will change his DME.    Larey Seat, MD   cc Dr. Burt Knack cc Dr. Alvy Bimler cc Dr. MD      @[email or web address]

## 2017-10-18 NOTE — Patient Instructions (Signed)

## 2017-10-27 ENCOUNTER — Other Ambulatory Visit: Payer: Self-pay | Admitting: Neurology

## 2017-10-28 DIAGNOSIS — M19042 Primary osteoarthritis, left hand: Secondary | ICD-10-CM | POA: Diagnosis not present

## 2017-10-28 DIAGNOSIS — M1812 Unilateral primary osteoarthritis of first carpometacarpal joint, left hand: Secondary | ICD-10-CM | POA: Diagnosis not present

## 2017-10-28 DIAGNOSIS — R972 Elevated prostate specific antigen [PSA]: Secondary | ICD-10-CM | POA: Diagnosis not present

## 2017-11-03 ENCOUNTER — Encounter: Payer: Self-pay | Admitting: Cardiovascular Disease

## 2017-11-03 ENCOUNTER — Ambulatory Visit (INDEPENDENT_AMBULATORY_CARE_PROVIDER_SITE_OTHER): Payer: Medicare Other | Admitting: Cardiovascular Disease

## 2017-11-03 VITALS — BP 122/70 | HR 72 | Ht 70.0 in | Wt 194.4 lb

## 2017-11-03 DIAGNOSIS — I38 Endocarditis, valve unspecified: Secondary | ICD-10-CM

## 2017-11-03 DIAGNOSIS — I10 Essential (primary) hypertension: Secondary | ICD-10-CM

## 2017-11-03 DIAGNOSIS — I4891 Unspecified atrial fibrillation: Secondary | ICD-10-CM

## 2017-11-03 DIAGNOSIS — I2510 Atherosclerotic heart disease of native coronary artery without angina pectoris: Secondary | ICD-10-CM

## 2017-11-03 NOTE — Patient Instructions (Signed)

## 2017-11-03 NOTE — Progress Notes (Signed)
Cardiology Office Note Date:  11/05/2017   ID:  Derrick [name], DOB 04-26-1941, MRN [id number]  PCP:  [name], MD  Cardiologist:  [name], MD    Chief Complaint  Patient presents with  . Follow-up    CAD     History of Present Illness: Derrick [name] is a 76 y.o. male who presents for follow-up evaluation.   The patient is followed for coronary artery disease with history of inferior MI and primary PCI with drug-eluting stent placement in the right coronary artery.  He has moderate left main disease which is been interrogated by pressure wire analysis without significant ischemia.  He has had no recurrence of anginal symptoms.  He has other medical problems including CIDP, pulmonary sarcoid, and monoclonal gammopathy of undetermined significance.  The patient is here alone today.  He denies chest pain, shortness of breath, leg swelling, or heart palpitations.  He has days where he feels like his legs are heavy but cannot associate any other specific symptoms with that.   Past Medical History:  Diagnosis Date  . BENIGN PROSTATIC HYPERTROPHY, WITH OBSTRUCTION 05/28/2010  . CAD (coronary artery disease)    a. s/p NSTEMI 06/01/11: DES to RCA;  b. cath 06/25/11:   dLM 50-60% (FFR 0.87), prox to mid LAD 40-50%, D1 50%, pCFX 50%, RCA stent ok, dPDA 80%, EF 55-60%.  His FFR was felt to be negative and therefore medical therapy was recommended ;  echo 6/12: EF 55-60%, mild AS   . Chronic back pain   . CIDP (chronic inflammatory demyelinating polyneuropathy) ([place]) 04/11/2012  . Colon cancer ([place]) dx'd 2000   "left"  . COLONIC POLYPS, ADENOMATOUS, HX OF 05/28/2010  . Complication of anesthesia    "stopped breathing; related to my sleep apnea"   . COPD (chronic obstructive pulmonary disease) ([place])    "associated w/lung sarcoidosis"  . 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 DISH (diffuse idiopathic skeletal hyperostosis) 12/02/2015  . Diverticulitis   . GENERALIZED OSTEOARTHROSIS  UNSPECIFIED SITE 05/28/2010  . GERD 05/28/2010  . Heart murmur   . History of gout   . HYPERLIPIDEMIA 05/28/2010  . Hypertension   . IgM lambda paraproteinemia   . 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 Malignant neoplasm of descending colon ([place]) 05/28/2010  . MITRAL VALVE PROLAPSE 05/28/2010  . 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 NSTEMI (non-ST elevated myocardial infarction) ([place]) 06/03/11  . 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  . OSA on CPAP 05/28/2010  . PALPITATIONS, CHRONIC 05/28/2010  . Parotid gland pain 2010   infection  . Peripheral neuropathy    "tx'd w/targeted chemo" (05/12/2016)  . Pneumonia 2-3 times  . Presence of permanent cardiac pacemaker   . 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 Sarcoidosis    pulmonalis  . UNSPECIFIED INFLAMMATORY AND TOXIC NEUROPATHY 05/28/2010  . Vision loss   . VITAMIN D DEFICIENCY 05/28/2010    Past Surgical History:  Procedure Laterality Date  . APPENDECTOMY  1954  . CATARACT EXTRACTION W/ INTRAOCULAR LENS IMPLANT Left   . COLON SURGERY  2000   decending colon   . CORONARY ANGIOPLASTY WITH STENT PLACEMENT  06/03/11  . INSERT / REPLACE / REMOVE PACEMAKER  05/12/2016  . KNEE ARTHROSCOPY Right 2003  . LUNG SURGERY  2001   "open lung dissection"  . MOHS SURGERY Left ~ 2008   ear  . PROSTATE BIOPSY  2010  . TONSILLECTOMY  1950    Current Outpatient  Medications  Medication Sig Dispense Refill  . albuterol (PROVENTIL HFA;VENTOLIN HFA) 108 (90 BASE) MCG/ACT inhaler Inhale 2 puffs into the lungs every 6 (six) hours as needed for wheezing or shortness of breath.    [name] amLODipine (NORVASC) 10 MG tablet Take 1 tablet (10 mg total) by mouth daily. 90 tablet 3  . atorvastatin (LIPITOR) 20 MG tablet TAKE 1 TABLET BY MOUTH ONCE DAILY 90 tablet 2  . bimatoprost (LUMIGAN) 0.01 % SOLN Place 1 drop into both eyes at bedtime.    . budesonide (PULMICORT) 180 MCG/ACT inhaler Inhale 2 puffs 2 (two) times  daily as needed into the lungs (wheezing or shortness of breath).    . CHOLECALCIFEROL PO Take 1,000 mg by mouth daily. Vitamin D 3    . dorzolamide-timolol (COSOPT) 22.3-6.8 MG/ML ophthalmic solution Place 1 drop into both eyes 2 (two) times daily.     [name] dronedarone (MULTAQ) 400 MG tablet Take 1 tablet (400 mg total) by mouth 2 (two) times daily with a meal. 180 tablet 3  . ELIQUIS 5 MG TABS tablet TAKE 1 TABLET BY MOUTH TWICE DAILY 180 tablet 2  . finasteride (PROSCAR) 5 MG tablet Take 5 mg by mouth daily.     . fluticasone (FLONASE) 50 MCG/ACT nasal spray Place 2 sprays into both nostrils 2 (two) times daily.    [name] gabapentin (NEURONTIN) 800 MG tablet TAKE 1 TABLET BY MOUTH THREE TIMES DAILY 270 tablet 2  . pantoprazole (PROTONIX) 40 MG tablet TAKE 1 TABLET BY MOUTH ONCE DAILY 90 tablet 2  . tadalafil (CIALIS) 5 MG tablet Take 5 mg by mouth daily. Reported on 05/26/2016    . triamterene-hydrochlorothiazide (MAXZIDE-25) 37.5-25 MG tablet Take 1 tablet by mouth daily. 90 tablet 3   No current facility-administered medications for this visit.     Allergies:   Patient has no known allergies.   Social History:  The patient  reports that  has never smoked. he has never used smokeless tobacco. He reports that he drinks about 8.4 oz of alcohol per week. He reports that he does not use drugs.   Family History:  The patient's family history includes Arrhythmia (age of onset: 74) in his brother; Arthritis in his mother; Cancer in his brother; Colon polyps in his mother; Coronary artery disease in his brother and mother; Heart disease in his mother.    ROS:  Please see the history of present illness.   All other systems are reviewed and negative.    PHYSICAL EXAM: VS:  BP 122/70   Pulse 72   Ht 5\' 10"  (1.778 m)   Wt 194 lb 6.4 oz (88.2 kg)   BMI 27.89 kg/m  , BMI Body mass index is 27.89 kg/m. GEN: Well nourished, well developed, in no acute distress  HEENT: normal  Neck: no JVD, no masses.  No carotid bruits Cardiac: RRR with 2/6 SEM at the RUSB           Respiratory:  clear to auscultation bilaterally, normal work of breathing GI: soft, nontender, nondistended, + BS MS: no deformity or atrophy  Ext: no pretibial edema, pedal pulses 2+= bilaterally Skin: warm and dry, no rash Neuro:  Strength and sensation are intact Psych: euthymic mood, full affect  EKG:  EKG is not ordered today.  Recent Labs: 03/29/2017: BUN 19; Creatinine, Ser 1.01; Hemoglobin 13.9; Platelets 196.0; Potassium 3.9; Sodium 139; TSH 3.02 04/04/2017: ALT 15   Lipid Panel     Component Value Date/Time  CHOL 164 04/04/2017 1405   TRIG 85 04/04/2017 1405   HDL 57 04/04/2017 1405   CHOLHDL 2.9 04/04/2017 1405   CHOLHDL 2.7 03/19/2016 0922   VLDL 14 03/19/2016 0922   LDLCALC 90 04/04/2017 1405      Wt Readings from Last 3 Encounters:  11/03/17 194 lb 6.4 oz (88.2 kg)  10/18/17 194 lb (88 kg)  09/26/17 194 lb 3.2 oz (88.1 kg)     Cardiac Studies Reviewed: Echo 05-04-2017: Study Conclusions  - Left ventricle: The cavity size was mildly dilated. Wall   thickness was increased in a pattern of mild LVH. Systolic   function was normal. The estimated ejection fraction was in the   range of 55% to 60%. Hypokinesis of the mid-apical anteroseptal,   anterior, apical inferior, and apical myocardium. Doppler   parameters are consistent with abnormal left ventricular   relaxation (grade 1 diastolic dysfunction). - Aortic valve: Valve mobility was restricted. Transvalvular   velocity was within the normal range. There was no stenosis.   There was no regurgitation. Valve area (VTI): 1.53 cm^2. Valve   area (Vmax): 1.56 cm^2. Valve area (Vmean): 1.48 cm^2. - Mitral valve: Transvalvular velocity was within the normal range.   There was no evidence for stenosis. There was trivial   regurgitation. Valve area by pressure half-time: 2.44 cm^2. - Right ventricle: The cavity size was normal. Wall thickness was    normal. Systolic function was normal. - Atrial septum: No defect or patent foramen ovale was identified. - Tricuspid valve: There was mild regurgitation. - Pulmonary arteries: Systolic pressure was within the normal   range. PA peak pressure: 21 mm Hg (S).  ASSESSMENT AND PLAN: 1.  Coronary artery disease, native vessel, without angina: Patient is stable from a cardiac perspective.  He is on no antiplatelet therapy because of chronic oral anticoagulation with apixaban.  We will see him back in follow-up in 6 months.  2.  Hypertension: Treated with amlodipine and triamterene/HCTZ.  Blood pressure is well controlled.  3.  Hyperlipidemia: Continue atorvastatin 20 mg daily.  4.  Paroxysmal atrial fibrillation: Maintaining sinus rhythm.  Tolerating apixaban without bleeding complications.  5.  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  Followed by EP.  Current medicines are reviewed with the patient today.  The patient does not have concerns regarding medicines.  Labs/ tests ordered today include:  No orders of the defined types were placed in this encounter.   Disposition:   FU 6 months  Signed, [name], MD  11/05/2017 8:30 AM    [address], [place], [place]  [address] Phone: [phone]; Fax: [phone]

## 2017-11-04 DIAGNOSIS — N5201 Erectile dysfunction due to arterial insufficiency: Secondary | ICD-10-CM | POA: Diagnosis not present

## 2017-11-04 DIAGNOSIS — R972 Elevated prostate specific antigen [PSA]: Secondary | ICD-10-CM | POA: Diagnosis not present

## 2017-11-04 DIAGNOSIS — R3912 Poor urinary stream: Secondary | ICD-10-CM | POA: Diagnosis not present

## 2017-11-04 DIAGNOSIS — N401 Enlarged prostate with lower urinary tract symptoms: Secondary | ICD-10-CM | POA: Diagnosis not present

## 2017-11-05 ENCOUNTER — Encounter: Payer: Self-pay | Admitting: Cardiovascular Disease

## 2017-11-08 ENCOUNTER — Encounter: Payer: Self-pay | Admitting: Internal Medicine

## 2017-11-08 ENCOUNTER — Other Ambulatory Visit: Payer: Self-pay

## 2017-11-08 ENCOUNTER — Ambulatory Visit (AMBULATORY_SURGERY_CENTER): Payer: Medicare Other | Admitting: Internal Medicine

## 2017-11-08 VITALS — BP 124/65 | HR 62 | Temp 99.1°F | Resp 13 | Ht 70.0 in | Wt 196.0 lb

## 2017-11-08 DIAGNOSIS — D122 Benign neoplasm of ascending colon: Secondary | ICD-10-CM

## 2017-11-08 DIAGNOSIS — I10 Essential (primary) hypertension: Secondary | ICD-10-CM | POA: Diagnosis not present

## 2017-11-08 DIAGNOSIS — I252 Old myocardial infarction: Secondary | ICD-10-CM | POA: Diagnosis not present

## 2017-11-08 DIAGNOSIS — Z85038 Personal history of other malignant neoplasm of large intestine: Secondary | ICD-10-CM

## 2017-11-08 DIAGNOSIS — D124 Benign neoplasm of descending colon: Secondary | ICD-10-CM | POA: Diagnosis not present

## 2017-11-08 DIAGNOSIS — I2510 Atherosclerotic heart disease of native coronary artery without angina pectoris: Secondary | ICD-10-CM | POA: Diagnosis not present

## 2017-11-08 MED ORDER — SODIUM CHLORIDE 0.9 % IV SOLN: 500.0000 mL | INTRAVENOUS | Status: DC

## 2017-11-08 NOTE — Progress Notes (Signed)
Report given to PACU, vss 

## 2017-11-08 NOTE — Progress Notes (Signed)
Pt's states no medical or surgical changes since previsit or office visit. 

## 2017-11-08 NOTE — Progress Notes (Signed)
Called to room to assist during endoscopic procedure.  Patient ID and intended procedure confirmed with present staff. Received instructions for my participation in the procedure from the performing physician.  

## 2017-11-08 NOTE — Patient Instructions (Signed)
   Resume Eliquis at prior dose tomorrow-refer to managing physician for further adjustment    INFORMATION ON POLYPS GIVEN TO YOU TODAY   YOU HAD AN ENDOSCOPIC PROCEDURE TODAY AT [facility]:   Refer to the procedure report that was given to you for any specific questions about what was found during the examination.  If the procedure report does not answer your questions, please call your gastroenterologist to clarify.  If you requested that your care partner not be given the details of your procedure findings, then the procedure report has been included in a sealed envelope for you to review at your convenience later.  YOU SHOULD EXPECT: Some feelings of bloating in the abdomen. Passage of more gas than usual.  Walking can help get rid of the air that was put into your GI tract during the procedure and reduce the bloating. If you had a lower endoscopy (such as a colonoscopy or flexible sigmoidoscopy) you may notice spotting of blood in your stool or on the toilet paper. If you underwent a bowel prep for your procedure, you may not have a normal bowel movement for a few days.  Please Note:  You might notice some irritation and congestion in your nose or some drainage.  This is from the oxygen used during your procedure.  There is no need for concern and it should clear up in a day or so.  SYMPTOMS TO REPORT IMMEDIATELY:   Following lower endoscopy (colonoscopy or flexible sigmoidoscopy):  Excessive amounts of blood in the stool  Significant tenderness or worsening of abdominal pains  Swelling of the abdomen that is new, acute  Fever of 100F or higher    For urgent or emergent issues, a gastroenterologist can be reached at any hour by calling [phone].   DIET:  We do recommend a small meal at first, but then you may proceed to your regular diet.  Drink plenty of fluids but you should avoid alcoholic beverages for 24 hours.  ACTIVITY:  You should plan to take it  easy for the rest of today and you should NOT DRIVE or use heavy machinery until tomorrow (because of the sedation medicines used during the test).    FOLLOW UP: Our staff will call the number listed on your records the next business day following your procedure to check on you and address any questions or concerns that you may have regarding the information given to you following your procedure. If we do not reach you, we will leave a message.  However, if you are feeling well and you are not experiencing any problems, there is no need to return our call.  We will assume that you have returned to your regular daily activities without incident.  If any biopsies were taken you will be contacted by phone or by letter within the next 1-3 weeks.  Please call us at [phone] if you have not heard about the biopsies in 3 weeks.    SIGNATURES/CONFIDENTIALITY: You and/or your care partner have signed paperwork which will be entered into your electronic medical record.  These signatures attest to the fact that that the information above on your After Visit Summary has been reviewed and is understood.  Full responsibility of the confidentiality of this discharge information lies with you and/or your care-partner.

## 2017-11-08 NOTE — Op Note (Signed)
[facility] Patient Name: Derrick [name] Procedure Date: 11/08/2017 1:38 PM MRN: [id number] Endoscopist: Jerene Bears , MD Age: 76 Referring MD:  Date of Birth: 1941/01/31 Gender: Male Account #: [id number] Procedure:                Colonoscopy Indications:              High risk colon cancer surveillance: Personal                            history of adenoma with villous component, High                            risk colon cancer surveillance: Personal history of                            colon cancer, Last colonoscopy 3 years ago Medicines:                Monitored Anesthesia Care Procedure:                Pre-Anesthesia Assessment:                           - Prior to the procedure, a History and Physical                            was performed, and patient medications and                            allergies were reviewed. The patient's tolerance of                            previous anesthesia was also reviewed. The risks                            and benefits of the procedure and the sedation                            options and risks were discussed with the patient.                            All questions were answered, and informed consent                            was obtained. Prior Anticoagulants: The patient has                            taken Eliquis (apixaban), last dose was 2 days                            prior to procedure. ASA Grade Assessment: III - A                            patient with severe systemic disease. After  reviewing the risks and benefits, the patient was                            deemed in satisfactory condition to undergo the                            procedure.                           After obtaining informed consent, the colonoscope                            was passed under direct vision. Throughout the                            procedure, the patient's blood pressure, pulse, and                oxygen saturations were monitored continuously. The                            Colonoscope was introduced through the anus and                            advanced to the the cecum, identified by                            appendiceal orifice and ileocecal valve. The                            colonoscopy was performed without difficulty. The                            patient tolerated the procedure well. The quality                            of the bowel preparation was good. The ileocecal                            valve, appendiceal orifice, and rectum were                            photographed. Scope In: 1:41:06 PM Scope Out: 2:03:25 PM Scope Withdrawal Time: 0 hours 18 minutes 1 second  Total Procedure Duration: 0 hours 22 minutes 19 seconds  Findings:                 The digital rectal exam was normal.                           A 10 mm polyp was found in the ascending colon. The                            polyp was sessile. The polyp was removed with a  cold snare. Resection and retrieval were complete.                           A 3 mm polyp was found in the ascending colon. The                            polyp was sessile. The polyp was removed with a                            cold snare. Resection and retrieval were complete.                           Two sessile polyps were found in the descending                            colon. The polyps were 3 to 5 mm in size. These                            polyps were removed with a cold snare. Resection                            and retrieval were complete.                           There was evidence of a prior end-to-end                            colo-colonic anastomosis in the distal sigmoid                            colon. This was patent and was characterized by                            healthy appearing mucosa.                           Internal hemorrhoids were found during                             retroflexion. The hemorrhoids were samm in size. Complications:            No immediate complications. Estimated Blood Loss:     Estimated blood loss was minimal. Impression:               - One 10 mm polyp in the ascending colon, removed                            with a cold snare. Resected and retrieved.                           - One 3 mm polyp in the ascending colon, removed                            with a  cold snare. Resected and retrieved.                           - Two 3 to 5 mm polyps in the descending colon,                            removed with a cold snare. Resected and retrieved.                           - Patent end-to-end colo-colonic anastomosis,                            characterized by healthy appearing mucosa.                           - Small internal hemorrhoids. Recommendation:           - Patient has a contact number available for                            emergencies. The signs and symptoms of potential                            delayed complications were discussed with the                            patient. Return to normal activities tomorrow.                            Written discharge instructions were provided to the                            patient.                           - Resume previous diet.                           - Continue present medications.                           - Resume Eliquis (apixaban) at prior dose tomorrow.                            Refer to managing physician for further adjustment                            of therapy.                           - Await pathology results.                           - Repeat colonoscopy is recommended for                            surveillance. The colonoscopy  date will be                            determined after pathology results from today's                            exam become available for review. Jerene Bears, MD 11/08/2017 2:10:19 PM This report has been  signed electronically.

## 2017-11-09 ENCOUNTER — Telehealth: Payer: Self-pay

## 2017-11-09 NOTE — Telephone Encounter (Signed)
  Follow up Call-  Call back number 11/08/2017  Post procedure Call Back phone  # [phone]  Permission to leave phone message Yes  Some recent data might be hidden     Patient questions:  Do you have a fever, pain , or abdominal swelling? No. Pain Score  0 *  Have you tolerated food without any problems? Yes.    Have you been able to return to your normal activities? Yes.    Do you have any questions about your discharge instructions: Diet   No. Medications  No. Follow up visit  No.  Do you have questions or concerns about your Care? No.  Actions: * If pain score is 4 or above: No action needed, pain <4.

## 2017-11-15 ENCOUNTER — Encounter: Payer: Self-pay | Admitting: Internal Medicine

## 2017-11-28 ENCOUNTER — Ambulatory Visit (INDEPENDENT_AMBULATORY_CARE_PROVIDER_SITE_OTHER): Payer: Medicare Other | Admitting: Neurology

## 2017-11-28 DIAGNOSIS — I4729 Other ventricular tachycardia: Secondary | ICD-10-CM

## 2017-11-28 DIAGNOSIS — R001 Bradycardia, unspecified: Secondary | ICD-10-CM

## 2017-11-28 DIAGNOSIS — G4731 Primary central sleep apnea: Secondary | ICD-10-CM

## 2017-11-28 DIAGNOSIS — I472 Ventricular tachycardia, unspecified: Secondary | ICD-10-CM

## 2017-11-28 DIAGNOSIS — Z950 Presence of cardiac pacemaker: Secondary | ICD-10-CM

## 2017-11-28 DIAGNOSIS — G4739 Other sleep apnea: Secondary | ICD-10-CM

## 2017-11-28 DIAGNOSIS — D472 Monoclonal gammopathy: Secondary | ICD-10-CM

## 2017-11-28 DIAGNOSIS — G63 Polyneuropathy in diseases classified elsewhere: Secondary | ICD-10-CM

## 2017-11-29 ENCOUNTER — Encounter: Payer: Self-pay | Admitting: Neurology

## 2017-11-29 ENCOUNTER — Telehealth: Payer: Self-pay | Admitting: Neurology

## 2017-11-29 NOTE — Telephone Encounter (Signed)
Pt called he completed sleep study last night and requesting to stay with [facility]. Pt said he does not need a return call

## 2017-11-30 ENCOUNTER — Other Ambulatory Visit: Payer: Self-pay | Admitting: Neurology

## 2017-11-30 ENCOUNTER — Telehealth: Payer: Self-pay | Admitting: Neurology

## 2017-11-30 DIAGNOSIS — I480 Paroxysmal atrial fibrillation: Secondary | ICD-10-CM

## 2017-11-30 DIAGNOSIS — G4733 Obstructive sleep apnea (adult) (pediatric): Secondary | ICD-10-CM

## 2017-11-30 DIAGNOSIS — N401 Enlarged prostate with lower urinary tract symptoms: Secondary | ICD-10-CM

## 2017-11-30 DIAGNOSIS — Z950 Presence of cardiac pacemaker: Secondary | ICD-10-CM

## 2017-11-30 DIAGNOSIS — D869 Sarcoidosis, unspecified: Secondary | ICD-10-CM

## 2017-11-30 DIAGNOSIS — I25810 Atherosclerosis of coronary artery bypass graft(s) without angina pectoris: Secondary | ICD-10-CM

## 2017-11-30 DIAGNOSIS — R001 Bradycardia, unspecified: Secondary | ICD-10-CM | POA: Insufficient documentation

## 2017-11-30 DIAGNOSIS — R351 Nocturia: Secondary | ICD-10-CM

## 2017-11-30 DIAGNOSIS — I472 Ventricular tachycardia: Secondary | ICD-10-CM | POA: Insufficient documentation

## 2017-11-30 DIAGNOSIS — I4729 Other ventricular tachycardia: Secondary | ICD-10-CM | POA: Insufficient documentation

## 2017-11-30 NOTE — Telephone Encounter (Signed)
-----   Message from Larey Seat, MD sent at 11/30/2017 12:33 PM EST ----- Confirmed sleep apnea- severe, but obstructive. Unusual as he had no REM sleep apneas as I would have expected, and significant hypoxemia. I ordered auto CPAP 5 -12 cm water, 3 cm EPR and heated humidity with his mask of choice.

## 2017-11-30 NOTE — Procedures (Signed)
PATIENT'S NAME:  Derrick [name], Derrick [name] DOB:      Jan 17, 1941      MR#:    [id number]     DATE OF RECORDING: 11/28/2017 REFERRING M.D.:  [name] MD Study Performed:  Split-Night Titration Study HISTORY:   The patient has been a compliant CPAP user but his machine has been temperamental- and today's download confirms this with 100% compliance rate up until October 21, 9 hours 8 minutes of average nocturnal use, set pressure is 9 cm water with 2 cm EPR -the residual AHI is 4.8. There are some residual obstructive apneas noted. There is no treatment emergent central apnea noted. Mr. Canning needs a new CPAP machine which I would like to be an auto titrate capable CPAP. Further developments: Ventricular tachycardia. Aortic stenosis. CAD. Cardiologist is Dr. Burt Derrick [name], electrophysiologist Dr. Garlan Fillers. MGUS- Neuropathy with falls and stumbling, gabapentin controls pain.  His Epworth score is endorsed at one point ,his fatigue severity at 29 points and his depression score at 1 out of 15 points. The patient's weight 194 pounds with a height of 70 (inches), resulting in a BMI of 27.8 kg/m2. The patient's neck circumference measured 16.8 inches.  CURRENT MEDICATIONS: Albuterol, Amlodipine, Atorvastatin, Bimatoprost eye drops , Budesonide, Cholecalciferol, Dorzolamide, Eliquis, Finasteride, Fluticasone, Gabapentin, Pantoprazole, Tadalafil and Triamterene-Hydrochlorothiazide.   PROCEDURE:  This is a multichannel digital polysomnogram utilizing the Somnostar 11.2 system.  Electrodes and sensors were applied and monitored per AASM Specifications.   EEG, EOG, Chin and Limb EMG, were sampled at 200 Hz.  ECG, Snore and Nasal Pressure, Thermal Airflow, Respiratory Effort, CPAP Flow and Pressure, Oximetry was sampled at 50 Hz. Digital video and audio were recorded.      BASELINE STUDY WITHOUT CPAP RESULTS: Lights Out was at 21:02 and Lights On at 05:00.  Total recording time (TRT) was 211.5, with a total sleep time (TST) of 120.5  minutes.   The patient's sleep latency was prolonged at 88.5 minutes.  The sleep efficiency was 57.9%. Void of REM sleep.     SLEEP ARCHITECTURE: WASO (Wake after sleep onset) was 49.5 minutes, Stage N1 was 37 minutes, Stage N2 was 83.5 minutes, Stage N3 was 0 minutes and Stage R (REM sleep) was 0 minutes.  The percentages were Stage N1 30.7%, Stage N2 69.3%, Stage N3 0% and Stage R (REM sleep) 0%.   RESPIRATORY ANALYSIS:  There were a total of 129 respiratory events:  98 obstructive apneas, 0 central apneas and 0 mixed apneas with a total of 98 apneas and an apnea index (AI) of 48.8. There were 31 hypopneas with a hypopnea index of 15.4. The patient also had 0 respiratory event related arousals (RERAs).     The total APNEA/HYPOPNEA INDEX (AHI) was 64.2 /hour and the total RESPIRATORY DISTURBANCE INDEX was 64.2 /hour.  0 events occurred in REM sleep and 62 events in NREM. The patient spent 291.5 minutes sleep time in the supine position 0 minutes in non-supine. The supine AHI was 64.2 /hour versus a non-supine AHI of 0.0 /hour.  OXYGEN SATURATION & C02:  The wake baseline 02 saturation was 92%, with the lowest being 79%. Time spent below 89% saturation equaled 74 minutes.  PERIODIC LIMB MOVEMENTS:   The patient had a total of 0 Periodic Limb Movements. The arousals were noted as: 2 were spontaneous, 0 were associated with PLMs, and 129 were associated with respiratory events. Audio and video analysis did not show any abnormal or unusual movements, behaviors, phonations or vocalizations  TITRATION STUDY  WITH CPAP RESULTS: CPAP was initiated at 5 cmH20 with heated humidity per AASM split night standards and pressure was advanced to 9/9 cmH20 because of hypopneas, apneas and desaturations.  At a PAP pressure of 9 cmH20, there was a reduction of the AHI to 0.0 /hour. Sleep efficiency under 9 cm water rose to 99% but SpO2 nadir remained 81%     Total recording time (TRT) under CPAP was 267 minutes, with a  total sleep time (TST) of 171 minutes. The patient's sleep latency was again prolonged at 102 minutes. REM latency was 60 minutes.  The sleep efficiency was 64.9 %.  SLEEP ARCHITECTURE: Wake after sleep was 2.5 minutes, Stage N1 3 minutes, Stage N2 125.5 minutes, Stage N3 0 minutes and Stage R (REM sleep) 42.5 minutes. The percentages were: Stage N1 1.8%, Stage N2 73.4%, Stage N3 0% and Stage R (REM sleep) 24.9%. The sleep architecture was notable for REM rebound.  The arousals were noted as: 3 were spontaneous, 0 were associated with PLMs, and 33 were associated with respiratory events. RESPIRATORY ANALYSIS:  There were a total of 33 respiratory events: 0 apneas and 33 hypopneas with a hypopnea index of 11.6 /hour. The patient also had 0 respiratory event related arousals (RERAs). The total APNEA/HYPOPNEA INDEX (AHI) was 11.6 /hour and the total RESPIRATORY DISTURBANCE INDEX was 11.6 /hour.  0 events occurred in REM sleep and 33 events in NREM.  The REM AHI was 0.0 /hour versus a non-REM AHI of 15.4 /hour. The patient spent 100% of total sleep time in the supine position. The supine AHI was 11.6 /hour.  OXYGEN SATURATION & C02:  The wake baseline 02 saturation was 96%, with the lowest being 81%. Time spent below 89% saturation equaled 14 minutes.  PERIODIC LIMB MOVEMENTS: The patient had a total of 0 Periodic Limb Movements. Post-study, the patient indicated that sleep was the same as usual.  POLYSOMNOGRAPHY IMPRESSION :   1. Severe Obstructive Sleep Apnea (OSA) at AHI 64.2/hr.  2. Clinically significant hypoxemia.    RECOMMENDATIONS:  1. OSA was significantly reduced under CPAP pressure of 9 cm water, but the hypoxemia was not.  2. I will order an auto titration CPAP with settings from 5.0 cm water through 12 cm water, with 3 cm EPR and heated humidity, mask of patient's choice. The patient is experienced with CPAP use and knows the compliance requirements.  3. Further information regarding  OSA may be obtained from [employer] (www.sleepfoundation.org) or American Sleep Apnea Association (www.sleepapnea.org).  4. A follow up appointment will be scheduled in the Sleep Clinic at [facility].      I certify that I have reviewed the entire raw data recording prior to the issuance of this report in accordance with the Standards of Accreditation of the American Academy of Sleep Medicine (AASM)      Larey Seat, M.D.    11-30-2017  Diplomat, American Board of Psychiatry and Neurology  Diplomat, [place] of Sleep Medicine Medical Director, [place] [facility]

## 2017-11-30 NOTE — Telephone Encounter (Signed)
I called pt. I advised pt that Dr. Brett Fairy reviewed their sleep study results and found that pt has severe sleep apnea. Dr. Brett Fairy recommends that pt start a auto CPAP. I reviewed PAP compliance expectations with the pt. Pt is agreeable to starting an auto-PAP. I advised pt that an order will be sent to a DME, [facility], and [facility] will call the pt within about one week after they file with the pt's insurance. [facility] will show the pt how to use the machine, fit for masks, and troubleshoot the auto-PAP if needed. A follow up appt was made for insurance purposes with Cecille Rubin, NP on March 01, 2018 at 8:45 am. Pt verbalized understanding to arrive 15 minutes early and bring their auto-PAP. A letter with all of this information in it will be mailed to the pt as a reminder. I verified with the pt that the address we have on file is correct. Pt verbalized understanding of results. Pt had no questions at this time but was encouraged to call back if questions arise.

## 2017-12-07 DIAGNOSIS — M5489 Other dorsalgia: Secondary | ICD-10-CM | POA: Diagnosis not present

## 2017-12-07 DIAGNOSIS — Z6829 Body mass index (BMI) 29.0-29.9, adult: Secondary | ICD-10-CM | POA: Diagnosis not present

## 2017-12-07 DIAGNOSIS — M150 Primary generalized (osteo)arthritis: Secondary | ICD-10-CM | POA: Diagnosis not present

## 2017-12-07 DIAGNOSIS — M2550 Pain in unspecified joint: Secondary | ICD-10-CM | POA: Diagnosis not present

## 2017-12-07 DIAGNOSIS — E663 Overweight: Secondary | ICD-10-CM | POA: Diagnosis not present

## 2017-12-26 ENCOUNTER — Ambulatory Visit (INDEPENDENT_AMBULATORY_CARE_PROVIDER_SITE_OTHER): Payer: Medicare Other | Admitting: *Deleted

## 2017-12-26 DIAGNOSIS — R001 Bradycardia, unspecified: Secondary | ICD-10-CM | POA: Diagnosis not present

## 2017-12-26 NOTE — Progress Notes (Signed)
Remote pacemaker transmission.   

## 2017-12-28 LAB — CUP PACEART REMOTE DEVICE CHECK
Battery Remaining Longevity: 82 mo
Battery Voltage: 3.01 V
Brady Statistic AP VP Percent: 97.07 %
Brady Statistic AP VS Percent: 0.04 %
Brady Statistic AS VP Percent: 2.89 %
Brady Statistic AS VS Percent: 0 %
Brady Statistic RA Percent Paced: 96.96 %
Brady Statistic RV Percent Paced: 99.77 %
Date Time Interrogation Session: 20181231140616
Implantable Lead Implant Date: 20170517
Implantable Lead Implant Date: 20170517
Implantable Lead Location: 753859
Implantable Lead Location: 753860
Implantable Lead Model: 5076
Implantable Lead Model: 5076
Implantable Pulse Generator Implant Date: 20170517
Lead Channel Impedance Value: 399 Ohm
Lead Channel Impedance Value: 475 Ohm
Lead Channel Impedance Value: 494 Ohm
Lead Channel Impedance Value: 627 Ohm
Lead Channel Pacing Threshold Amplitude: 0.75 V
Lead Channel Pacing Threshold Amplitude: 0.75 V
Lead Channel Pacing Threshold Pulse Width: 0.4 ms
Lead Channel Pacing Threshold Pulse Width: 0.4 ms
Lead Channel Sensing Intrinsic Amplitude: 1.375 mV
Lead Channel Sensing Intrinsic Amplitude: 1.375 mV
Lead Channel Sensing Intrinsic Amplitude: 16.5 mV
Lead Channel Sensing Intrinsic Amplitude: 16.5 mV
Lead Channel Setting Pacing Amplitude: 2 V
Lead Channel Setting Pacing Amplitude: 2.5 V
Lead Channel Setting Pacing Pulse Width: 0.4 ms
Lead Channel Setting Sensing Sensitivity: 2.8 mV

## 2017-12-30 ENCOUNTER — Encounter: Payer: Self-pay | Admitting: Cardiology

## 2018-01-09 ENCOUNTER — Telehealth: Payer: Self-pay | Admitting: Neurology

## 2018-01-09 NOTE — Telephone Encounter (Signed)
Patient will be ok there will be sufficient data for his first initial cpap f/u

## 2018-01-09 NOTE — Telephone Encounter (Signed)
Pt calling with concern re: his initial CPAP f/u for 03-06.  Pt has been out of town without new CPAP (been using his back up CPAP) he has been 7-8 days without new CPAP,  Pt asking for a call to know if he will need to change and push out his current appointment or if he will be okay to keep current date.  Please call

## 2018-01-10 NOTE — Telephone Encounter (Signed)
Patient is aware 

## 2018-01-26 ENCOUNTER — Telehealth: Payer: Self-pay

## 2018-01-26 ENCOUNTER — Encounter: Payer: Self-pay | Admitting: Neurology

## 2018-01-26 ENCOUNTER — Ambulatory Visit (INDEPENDENT_AMBULATORY_CARE_PROVIDER_SITE_OTHER): Payer: Medicare Other | Admitting: Neurology

## 2018-01-26 VITALS — BP 120/70 | HR 65 | Ht 70.0 in | Wt 201.0 lb

## 2018-01-26 DIAGNOSIS — Z9989 Dependence on other enabling machines and devices: Secondary | ICD-10-CM

## 2018-01-26 DIAGNOSIS — G608 Other hereditary and idiopathic neuropathies: Secondary | ICD-10-CM | POA: Diagnosis not present

## 2018-01-26 DIAGNOSIS — G4733 Obstructive sleep apnea (adult) (pediatric): Secondary | ICD-10-CM

## 2018-01-26 DIAGNOSIS — G63 Polyneuropathy in diseases classified elsewhere: Secondary | ICD-10-CM

## 2018-01-26 DIAGNOSIS — G3184 Mild cognitive impairment, so stated: Secondary | ICD-10-CM | POA: Diagnosis not present

## 2018-01-26 DIAGNOSIS — D472 Monoclonal gammopathy: Secondary | ICD-10-CM

## 2018-01-26 MED ORDER — GABAPENTIN 800 MG PO TABS: 400.0000 mg | ORAL_TABLET | Freq: Three times a day (TID) | ORAL | 2 refills | Status: DC

## 2018-01-26 NOTE — Progress Notes (Signed)
[facility]  Provider:  Larey Seat, M D  Referring Provider: [name], MD Primary Care Physician:  [name], MD  Chief Complaint  Patient presents with  . Follow-up    pt alone, rm 10. pt states CPAP working well. pt uses [employer] for supplies.   MGUS neuropathy, ankylsing spondylitis and OSA/ sarcoidosis.   Dr. 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 MGUS diagnosed there, too.   HPI:      Derrick [name] is a 77 y.o. male , caucasian of french, [place], and native- [place] decent . Is seen here as a revisit  from Dr. [name]. His PCP is Dr. [name], his ophthalmologist is Dr. [name]. 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  He had been for at least 12 years CPAP treatment at a pressure of 16 cm water,  he was also using a full face mask at the time , which was comfortable.  His original diagnostic sleep study took place in 2000. The patient endorsed  Epworth sleepiness score at the of 24 points in the FSS at an elevated rate of 42 points as well. His BMI at the time was 30. Meanwhile he had lost 65 pounds.   Derrick [name] underwent a sleep study on 11-30-2011. His AHI at time of study was 42.1 and his RDI 45.5 his REM AHI was 30 and non-REM 42.9 he did not have any supine sleep. He had oxygen desaturations to a nadir of 82% and had a total desaturation time of 52.6 minutes with a the first 3 hours of sleep.The results were indicative of severe sleep apnea and  the patient was titrated to CPAP on 12-05-2011 at the pressure of only 8 cm. He had REM rebound at this setting ,the residual AHI in the night was 0.3 per hour. He also recommended the use of a nasal pillow of possible.  The CPAP was download it in 2013 after 90 days of CPAP he was with a new pressure. He had 100% compliance rate and residual AHI of 3.4 with only moderate to mild air leak that pressure was 8 cm water. There are peak time per day was 8 hours and 48 minutes. CPAP still set at 8 cm water with  3 cm EPR.  The air leak has slightly increased the residual AHI is 2.2,  even better than last year. The  therapeutic time is 8 hour and 53 minutes.  The patient has 100% compliance. Today he endorsed Epworth sleepiness score at 0 point and the fatigue severity score at only 15 points.  Geriatric depression score was endorsed at he only scored 1 point not suggestive of clinical depression.  Interval history ;Derrick [name] has also a monoclonal gammopathy and has been treated with rituximab the medication had continuously improved his exercise tolerance, finally he was able to walk 30-40 minutes a day. He has been exercising daily.  He did develop not neuropathic pain,  but some and knee pain on the right and an He has comMRI was now showing DDD on the correspondin spinal level.  Completed his rituximab treatment with Dr. [name]. in February 2014 .He underwent a total of 3 steroid injections at the L5-S1 distribution which had meanwhile  shown a benefit. He has been on gabapentin for neuropathic pain, seems to be related to his MGUS diagnosis .He had developed a severe cold intolerance progressively actually the last year. He has noticed since March 2014  and since his Retuximab treatment was completed, that he has some cold cramping as well and toe cramps,  pain and numbness.  Last Time I ordered NCS and EMG.   Dr Jannifer Franklin did a EMG and NCS, and found axonal damage , not a finding of CIDP.  This was after 3 years of MGUS treatment and I answered his questions, "did I ever have CIDP'? I believe ,yes.  His strong family history for neuropathy of other reasons,imother, brother are affected, is likely a axonal neuropathy and  he is not immune to develop this second type.  The patient has hp pain but is ambulatory, he periodically loses control of his foot lifting muscles - stretching exercises help. As to his obstructive sleep apnea, there is no need for any adjustment at this time. His current settings are comfortable and has residual AHI is very low. In office download confirmed high compliance 02-13-14 . He is extremely intolerant to cold temperature, and has anhydrosis - this is more often seen in dysautonomia with NP.  He was identified as axonal neuropathy by Dr. Jannifer Franklin. I would like to follow the patient once a year for the CPAP exclusively for sleep - RV in 12 month . The CPAP  compliance report for 10-16-14 reveals a AHI of  1.2 and daily user time of  9 hours and 14 minutes, and 100% over 4 hour compliance over 4 hours of nightly use.  He has spinal stenosis - 4 times daily 400 mg neurontin.  A revisit will be scheduled in 12 months. Labs , CBC and Diff by oncologist. Cold agglutinin ordered. Plasma -Electrophoresis reordered.   03-26-15  Interval history Derrick [name] reports that he was recently babysitting the grandchildren in [place] and has noticed that he has less stamina less strength especially in his proximal leg muscles, and he was just yesterday told that he had an abnormal cardiac stress test. He has known coronary artery disease and a stent in the right main coronary artery he was now diagnosed with coronary artery disease in the left main. Catheterization is planned for Monday. As to his obstructive sleep apnea he is doing very well his compliance report from 03-26-15 shows 97% compliance for days of use and for 4 hours of use the average therapy time is 8 hours and 39 minutes at 9 cm water pressure with 2 cm EPR, residual AHI is 1.7. Fatigue severity score is 23 and Epworth sleepiness score is 1. the patient recently has had some desire to take some daytime naps which she has not had for a long time or  ever. Back from [place] , he noted a black toe nail, without history of trauma. Was this related to blood clot? Its only the nail , not the whole toe. It is tender to touch. 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 He has followed up with sinus practitioner Cecille Rubin.The patient has been a compliant CPAP use rand today's download confirms this with 100% compliance rate up until October 21, 9 hours 8 minutes of average nocturnal use, said pressure is 9 cm water  with 2 cm EPR residual AHI is 4.8 partially due to air leaks. There are some residual obstructive apneas noted. There is no treatment emergent central apnea noted. Derrick [name] needs a new CPAP machine which I would like to be an auto titrate capable CPAP.  Further developments : Ventricular tachycardia. Aortic stenosis. CAD. Cardiologist is Dr Burt Knack, electrophysiologist Dr. Garlan Fillers. MGUS- Neuropathy with falls and stumbling, gabapentin controls pain.  His Epworth score is endorsed at one point his fatigue severity at 29 points and his depression score at 1 out of 15 points.   Interval history from 26 January 2018, before he had a good Christmas time with his family, visiting grandchildren, and is here today for a routine visit dedicated to CPAP compliance and memory concerns.  The patient is a highly compliant patient using the machine average 8 hours and 7 minutes, and used the machine for the last 25 out of 30 days, has another CPAP at his second home which she used on the remaining 5 days.  Residual AHI is 1.7, there are mild to moderate leaks, the AutoSet is allowing a pressure between 5 and 12 cmH2O with 3 cm EPR;  there is no evidence of central apnea emerging.  This is a very good report. Part 2 of today's meeting is dedicated to his concerns about cognitive function; the  patient participated in a Montreal cognitive assessment, reaching 24 out of 30 points.  The patient mentioned that he took gabapentin just about an hour ago and that he feels quite sedated cognitively slowed by this medication.  Most importantly was fully oriented, serial 7 did very well, could not name enough F words ,but 3 out of 5 recall words. He reports trouble with names and nouns.    Review of Systems: Out of a complete 14 system review, the patient complains of only the following symptoms, and all other reviewed systems are negative. No residual daytime sleepiness, his sleep on CPAP has been restorative/ refreshing - he is highly compliant between 2 machines. He reports no nocturia. He describes difficulties delays in naming objects or finding names to people he knows.  Usually these come to him either with help or just after delay.  He reports that gabapentin does affect his cognition He reports ongoing problems probably worsening problems with his hands " falling asleep" and  feeling tingly and numb.  Very Cold intolerant-   Bradycardia has been treated with a pacemaker. 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    Social History   Socioeconomic History  . Marital status: Married    Spouse name: Not on file  . Number of children: 2  . Years of education: MS-Coll.  . Highest education level: Not on file  Social Needs  . Financial resource strain: Not on file  . Food insecurity - worry: Not on file  . Food insecurity - inability: Not on file  . Transportation needs - medical: Not on file  . Transportation needs - non-medical: Not on file  Occupational History  . Occupation: Optometrist    Comment: Building services engineer  Tobacco Use  . Smoking status: Never Smoker  . Smokeless tobacco: Never Used  Substance and Sexual Activity  . Alcohol use: Yes    Alcohol/week: 8.4 oz    Types: 14 Glasses of wine per week    Comment: 05/12/2016 "10oz wine qd"   . Drug use: No  . Sexual activity: Yes    Birth control/protection: None  Other Topics Concern  .  Not on file  Social History Narrative   College- [employer]; [employer]-environment science and mgt. Married Derrick [name])  "74" 1 son- 89; 1 dtr "22" 2 grandchildren. 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 End of life-provided discussive context and provided packet.  Social research officer, government, Counselling psychologist, worked at [employer] in [place], and all over the world.   Family History  Problem Relation Age of Onset  . Arthritis Mother        severe  . Coronary artery disease Mother   . Colon polyps Mother   . Heart disease Mother   . Coronary artery disease Brother   . Cancer Brother        choriocarcinoma  . Arrhythmia Brother 56       Afib/Tachycardia    Past Medical History:  Diagnosis Date  . BENIGN PROSTATIC HYPERTROPHY, WITH OBSTRUCTION 05/28/2010  . CAD (coronary artery disease)    a. s/p NSTEMI 06/01/11: DES to RCA;  b. cath 06/25/11:   dLM 50-60% (FFR 0.87), prox to mid LAD 40-50%, D1 50%, pCFX 50%, RCA stent ok, dPDA 80%, EF 55-60%.  His FFR was felt to be negative and therefore medical therapy was recommended ;  echo 6/12: EF 55-60%, mild AS   . Chronic back pain   . CIDP (chronic inflammatory demyelinating polyneuropathy) ([place]) 04/11/2012  . Colon cancer ([place]) dx'd 2000   "left"  . COLONIC POLYPS, ADENOMATOUS, HX OF 05/28/2010  . Complication of anesthesia    "stopped breathing; related to my sleep apnea"   . COPD (chronic obstructive pulmonary disease) ([place])    "associated w/lung sarcoidosis"  . 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 DISH (diffuse idiopathic skeletal hyperostosis) 12/02/2015  . Diverticulitis   . GENERALIZED OSTEOARTHROSIS UNSPECIFIED SITE 05/28/2010  . GERD 05/28/2010  . Heart murmur   . History of gout   . HYPERLIPIDEMIA 05/28/2010  . Hypertension   . IgM lambda paraproteinemia   . 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  Malignant neoplasm of descending colon ([place]) 05/28/2010  . MITRAL VALVE PROLAPSE 05/28/2010  . 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 NSTEMI (non-ST elevated myocardial infarction) ([place]) 06/03/11  . 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  . OSA on CPAP 05/28/2010  . PALPITATIONS, CHRONIC 05/28/2010  . Parotid gland pain 2010   infection  . Peripheral neuropathy    "tx'd w/targeted chemo" (05/12/2016)  . Pneumonia 2-3 times  . Presence of permanent cardiac pacemaker   . 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 Sarcoidosis    pulmonalis  . UNSPECIFIED INFLAMMATORY AND TOXIC NEUROPATHY 05/28/2010  . Vision loss   . VITAMIN D DEFICIENCY 05/28/2010    Past Surgical History:  Procedure Laterality Date  . APPENDECTOMY  1954  . CATARACT EXTRACTION W/ INTRAOCULAR LENS IMPLANT Left   . COLON SURGERY  2000   decending colon   . CORONARY ANGIOPLASTY WITH STENT PLACEMENT  06/03/11  . ELECTROPHYSIOLOGIC STUDY N/A 05/12/2016   Procedure: Cardioversion;  Surgeon: [name], MD;  Location: [facility];  Service: Cardiovascular;  Laterality: N/A;  . EP IMPLANTABLE DEVICE N/A 05/12/2016   Procedure: Pacemaker Implant;  Surgeon: [name], MD;  Location: [facility];  Service: Cardiovascular;  Laterality: N/A;  . INSERT / REPLACE / REMOVE PACEMAKER  05/12/2016  . KNEE ARTHROSCOPY Right 2003  . LEFT HEART CATHETERIZATION WITH CORONARY ANGIOGRAM N/A 03/31/2015   Procedure: LEFT HEART CATHETERIZATION WITH CORONARY ANGIOGRAM;  Surgeon: [name], MD;  Location: [facility];  Service: Cardiovascular;  Laterality: N/A;  . LUNG SURGERY  2001   "open lung dissection"  . MOHS SURGERY Left ~ 2008   ear  . PROSTATE BIOPSY  2010  . TONSILLECTOMY  1950    Current Outpatient Medications  Medication Sig Dispense Refill  . albuterol (PROVENTIL HFA;VENTOLIN HFA) 108 (90 BASE) MCG/ACT inhaler Inhale 2 puffs into the lungs every 6 (six) hours as needed for wheezing or  shortness of breath.    [name] amLODipine (NORVASC) 10 MG tablet Take 1 tablet (10 mg total) by mouth daily. 90 tablet 3  . atorvastatin (LIPITOR) 20 MG tablet TAKE 1 TABLET BY MOUTH ONCE DAILY 90 tablet 2  . bimatoprost (LUMIGAN) 0.01 % SOLN Place 1 drop into both eyes at bedtime.    . budesonide (PULMICORT) 180 MCG/ACT inhaler Inhale 2 puffs 2 (two) times daily as needed into the lungs (wheezing or shortness of breath).    . CHOLECALCIFEROL PO Take 1,000 mg by mouth daily. Vitamin D 3    . dorzolamide-timolol (COSOPT) 22.3-6.8 MG/ML ophthalmic solution Place 1 drop into both eyes 2 (two) times daily.     [name] dronedarone (MULTAQ) 400 MG tablet Take 1 tablet (400 mg total) by mouth 2 (two) times daily with a meal. 180 tablet 3  . ELIQUIS 5 MG TABS tablet TAKE 1 TABLET BY MOUTH TWICE DAILY 180 tablet 2  . finasteride (PROSCAR) 5 MG tablet Take 5 mg by mouth daily.     . fluticasone (FLONASE) 50 MCG/ACT nasal spray Place 2 sprays into both nostrils 2 (two) times daily.    [name] gabapentin (NEURONTIN) 800 MG tablet TAKE 1 TABLET BY MOUTH THREE TIMES DAILY 270 tablet 2  . pantoprazole (PROTONIX) 40 MG tablet TAKE 1 TABLET BY MOUTH ONCE DAILY 90 tablet 2  . tadalafil (CIALIS) 5 MG tablet Take 5 mg by mouth daily. Reported on 05/26/2016    . triamterene-hydrochlorothiazide (MAXZIDE-25) 37.5-25 MG tablet Take 1 tablet by mouth daily. 90 tablet 3   Current Facility-Administered Medications  Medication Dose Route Frequency Provider Last Rate Last Dose  . 0.9 %  sodium chloride infusion  500 mL Intravenous Continuous Pyrtle, Lajuan Lines, MD        Allergies as of 01/26/2018  . (No Known Allergies)    Vitals: BP 120/70   Pulse 65   Ht 5\' 10"  (1.778 m)   Wt 201 lb (91.2 kg)   BMI 28.84 kg/m  Last Weight:  Wt Readings from Last 1 Encounters:  01/26/18 201 lb (91.2 kg)   Last Height:   Ht Readings from Last 1 Encounters:  01/26/18 5\' 10"  (1.778 m)    Physical exam:  General: The patient is awake, alert  and appears not in acute distress. The patient is well groomed. Head: Normocephalic, atraumatic. Neck is supple.  Mallampati 1, neck circumference: 16.75 " No lymph node swelling, no parotid swelling.  Cardiovascular: irregular rate and rhythm, with mild syst. murmurs ,translated left carotid bruit, and without distended neck veins. Respiratory: Lungs are clear to auscultation. Skin:  Without evidence of edema, or rash Trunk: BMI is reduced, the patient lost over 60 pounds over last 24 month, as a result of cardiac rehabilitation. Neurologic exam : The patient is awake and alert, oriented to place and time.   Memory subjective described as intact. There is a normal attention span & concentration ability.   Montreal Cognitive Assessment  01/26/2018  Visuospatial/ Executive (0/5) 4  Naming (0/3)  3  Attention: Read list of digits (0/2) 1  Attention: Read list of letters (0/1) 1  Attention: Serial 7 subtraction starting at 100 (0/3) 3  Language: Repeat phrase (0/2) 1  Language : Fluency (0/1) 0  Abstraction (0/2) 2  Delayed Recall (0/5) 3  Orientation (0/6) 6  Total 24   No flowsheet data found.  Speech is fluent without  dysarthria, dysphonia or aphasia. Mood and affect are concerned.   Cranial nerves: Taste and smell have changed- he reports reduced ability to smell, taste is blunted.  Pupils are equal and briskly reactive to light. Extraocular movements  in vertical and horizontal planes intact and without nystagmus. Visual fields by finger perimetry are intact. Hearing to finger rub intact.  Facial sensation intact to fine touch. Facial motor strength is symmetric and tongue and uvula move midline. Motor exam:  Normal tone and normal muscle bulk and symmetric normal strength in all extremities.  Good grip strength is noted. He has a foot drop on the left foot, that is slowly and steadily progressed since 2013.  Sensory:  Fine touch, pinprick and vibration were decreased-.  The  patient has hyperesthesia in both planta pedis.  There is a slight decrease of vibratory sense at the ankle level, but the patient reports profound numbness - He also has lost proprioception , more recently this seems to to some degree affect his hands.  Coordination: Finger-to-nose maneuver tested and normal without evidence of ataxia, dysmetria or tremor. No change in handwriting .   Gait and station: Patient walks without assistive device and is able and assisted stool climb up to the exam table. Strength within normal limits. Left foot drop, he is slow- turns with 4-5 steps, rather fragmented.  Stance is wide based, not swaying - he needs to look where he steps, lost his " sense of where in space " he is . Romberg testing is positive. Deep tendon reflexes: in the  upper and lower extremities are attenuated , he has  foot drop in the left. Babinski maneuver response is equivocal.   Rituximab has reduced the patient's arthritic symptoms and has certainly beneficial effects on ankylosing spondylitis, the MGUS may well be associated with these autoimmune disorders at baseline.  His neuropathy also did not progress during the rituximab treatment. His last treatment was given in April  2014 and he is now on a " drug holiday" for over 3 years .   Assessment: 25 Jan 2018  The neuropathy progressed after rituximab treatment stopped, has now progressed to an axonal polyneuropathy per Dr. Jannifer Franklin. His hyperpathia, hyperesthesias and dyseasthesias have increased.  He continues to be monitored by his hematologist-oncologist Dr. Standley Dakins, MD- next appointment May 2019 .  He has ankylosing spondylitis and axonal neuropathy will likely progress if he is not treated with a specific immune modulation.   OSA- CPAP continuously needed, given his cardiac problems- and he has for years shown 100% compliance.  I will ordere a new machine , this one has become a fire hazard. He is on medicare - we needed to proof  the ongoing presence of apnea to get new machine,  CPAP autoset 5-11 cm . New DME is [facility].  Epworth score is 3 points, fatigue severity 38 points.  [facility]- Mr. Pautsch also underwent a Montreal cognitive assessment test today scored 24 out of 30 points.  He also mentions that he was cognitively slowed after the intake of 800 mg of gabapentin.  I will ask him  to health the gabapentin tablets of 800-400 mg at lunchtime may be even in the morning and to use the highest dose for evening at night.  I would like to add that the also underwent a MMSE by [facility] and did only missed one-point out of 30, 1 of the 3 recall words.  Pacemaker since 5/ 2017- CAD , MI , bradycardia.  RV in 6 month. Referral to memory research clinic. Sending memo now.   Larey Seat, MD   cc Dr. Burt Knack cc Dr. Alvy Bimler cc Dr. @[facility]

## 2018-01-26 NOTE — Telephone Encounter (Signed)
Called back with below message. He is going to think about restarting treatment and call us back. He is not sure.

## 2018-01-26 NOTE — Telephone Encounter (Signed)
Called to see if patient wanted to move appt up. He said he would to move the appt, but it is not urgent. He will be traveling the month of April. He is available anytime except the month of April.

## 2018-01-26 NOTE — Telephone Encounter (Signed)
Not helpful. The purpose to see him sooner would be if he is interested to restart treatment. If not, no need to move appt If he is interested to restart treatment, I can see him in 2 weeks with labs to be done next week

## 2018-01-26 NOTE — Patient Instructions (Signed)
Mild Neurocognitive Disorder Mild neurocognitive disorder (formerly known as mild cognitive impairment) is a mental disorder. It is a slight abnormal decrease in mental function. The areas of mental function affected may include memory, thought, communication, behavior, and completion of tasks. The decrease is noticeable and measurable but for the most part does not interfere with your daily activities. Mild neurocognitive disorder typically occurs in people older than 60 years but can occur earlier. It is not as serious as major neurocognitive disorder (formerly known as dementia) but may lead to a more serious neurocognitive disorder. However, in some cases the condition does not get worse. A few people with this disorder even improve. What are the causes? There are a number of different causes of mild neurocognitive disorder:  Brain disorders associated with abnormal protein deposits, such as Alzheimer's disease, Pick's disease, and Lewy body disease.  Brain disorders associated with abnormal movement, such as Parkinson's disease and Huntington's disease.  Diseases affecting blood vessels in the brain and resulting in mini-strokes.  Certain infections, such as human immunodeficiency virus (HIV) infection.  Traumatic brain injury.  Other medical conditions such as brain tumors, underactive thyroid (hypothyroidism), and vitamin B12 deficiency.  Use of certain prescription medicine and "recreational" drugs.  What are the signs or symptoms? Symptoms of mild neurocognitive disorder include:  Difficulty remembering. You may forget details of recent events, names, or phone numbers. You may forget important social events and appointments or repeatedly forget where you put your car keys.  Difficulty thinking and solving problems. You may have trouble with complex tasks such as paying bills or driving in unfamiliar locations.  Difficulty communicating. You may have trouble finding the right word,  naming an object, forming a sentence that makes sense, or understanding what you read or hear.  Changes in your behavior or personality. You may lose interest in the things that you used to enjoy or withdraw from social situations. You may get angry more easily than usual. You may act before thinking. You may do things in public that you would not usually do. You may hear or see things that are not real (hallucinations). You may believe falsely that others are trying to hurt you (paranoia).  How is this diagnosed? Mild neurocognitive disorder is diagnosed through an assessment by your health care provider. Your health care provider will ask you and your family, friends, or coworkers questions about your symptoms. He or she will ask how often the symptoms occur, how long they have been occurring, whether they are getting worse, and the effect they are having on your life. Your health care provider may refer you to a neurologist or mental health specialist for a detailed evaluation of your mental functions (neuropsychological testing). To identify the cause of your mild neurocognitive disorder, your health care provider may:  Obtain a detailed medical history.  Ask about alcohol and drug use, including prescription medicine.  Perform a physical exam.  Order blood tests and brain imaging exams.  How is this treated? Mild neurocognitive disorder caused by infections, use of certain medicines or "recreational" drugs, and certain medical conditions may improve with treatment of the condition that is causing the disorder. Mild neurocognitive disorder resulting from other causes generally does not improve and may worsen. In these cases, the goal of treatment is to slow progression of the disorder and help you cope with the loss of mental function. Treatments in these cases include:  Medicine. Medicine helps mainly with memory loss and behavioral symptoms.  Talk therapy.   Talk therapy provides education,  emotional support, memory aids, and other ways of making up for decreases in mental function.  Lifestyle changes. These include regular exercise, a healthy diet (including essential omega-3 fatty acids), intellectual stimulation, and increased social interaction.  This information is not intended to replace advice given to you by your health care provider. Make sure you discuss any questions you have with your health care provider. Document Released: 08/15/2013 Document Revised: 05/20/2016 Document Reviewed: 05/07/2013 Elsevier Interactive Patient Education  2017 Elsevier Inc.  

## 2018-01-26 NOTE — Progress Notes (Signed)
I received a note from neurologist that stated his symptoms are worse. Does the patient wants to move his appt sooner?

## 2018-01-29 ENCOUNTER — Encounter: Payer: Self-pay | Admitting: Hematology and Oncology

## 2018-01-30 DIAGNOSIS — M1812 Unilateral primary osteoarthritis of first carpometacarpal joint, left hand: Secondary | ICD-10-CM | POA: Diagnosis not present

## 2018-02-07 DIAGNOSIS — M5489 Other dorsalgia: Secondary | ICD-10-CM | POA: Diagnosis not present

## 2018-02-07 DIAGNOSIS — M25561 Pain in right knee: Secondary | ICD-10-CM | POA: Diagnosis not present

## 2018-02-07 DIAGNOSIS — M150 Primary generalized (osteo)arthritis: Secondary | ICD-10-CM | POA: Diagnosis not present

## 2018-02-07 DIAGNOSIS — E663 Overweight: Secondary | ICD-10-CM | POA: Diagnosis not present

## 2018-02-07 DIAGNOSIS — Z6829 Body mass index (BMI) 29.0-29.9, adult: Secondary | ICD-10-CM | POA: Diagnosis not present

## 2018-02-07 DIAGNOSIS — M2550 Pain in unspecified joint: Secondary | ICD-10-CM | POA: Diagnosis not present

## 2018-02-27 ENCOUNTER — Encounter: Payer: Self-pay | Admitting: Nurse Practitioner

## 2018-02-28 NOTE — Progress Notes (Signed)
[facility]  PATIENT: Derrick [name] DOB: 06/03/1941   REASON FOR VISIT: Obstructive sleep apnea here with new machine and CPAP compliance HISTORY FROM: Patient    HISTORY OF PRESENT ILLNESS:UPDATE 3/6/2019CM Derrick [name], 77 year old male returns for follow-up with history of obstructive sleep apnea on CPAP with a new machine.  He is not having any problems except that he complains that the machine is too loud.  Compliance data dated 01/29/2018-02/27/2018 shows compliance greater than 4 hours at 30 days for 100%.  Average usage 9 hours 35 minutes.  Pressure settings 5-12 cm .  EPR level 3 AHI 1.4.  He is using nasal pillows ESS 2.  He returns for reevaluation   Derrick [name] is a 77 y.o. male , caucasian of french, [place], and native- [place] decent . Is seen here as a revisit  from Dr. [name]. His PCP is Dr. [name], his ophthalmologist is Dr. [name]. 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  He had been for at least 12 years CPAP treatment at a pressure of 16 cm water,  he was also using a full face mask at the time , which was comfortable.  His original diagnostic sleep study took place in 2000. The patient endorsed  Epworth sleepiness score at the of 24 points in the FSS at an elevated rate of 42 points as well. His BMI at the time was 30. Meanwhile he had lost 65 pounds.   Derrick [name] underwent a sleep study on 11-30-2011. His AHI at time of study was 42.1 and his RDI 45.5 his REM AHI was 30 and non-REM 42.9 he did not have any supine sleep. He had oxygen desaturations to a nadir of 82% and had a total desaturation time of 52.6 minutes with a the first 3 hours of sleep.The results were indicative of severe sleep apnea and  the patient was titrated to  CPAP on 12-05-2011 at the pressure of only 8 cm. He had REM rebound at this setting ,the residual AHI in the night was 0.3 per hour. He also recommended the use of a nasal pillow of possible. The CPAP was download it in 2013 after 90 days of CPAP he was with a new pressure. He had 100% compliance rate and residual AHI of 3.4 with only moderate to mild air leak that pressure was 8 cm water. There are peak time per day was 8 hours and 48 minutes. CPAP still set at 8 cm water with  3 cm EPR.  The air leak has slightly increased the residual AHI is 2.2,  even better than last year. The  therapeutic time is 8 hour and 53 minutes.  The patient has 100% compliance. Today he endorsed Epworth sleepiness score at 0 point and the fatigue severity score at only 15 points.  Geriatric depression score was endorsed at he only scored 1 point not suggestive of clinical depression.  Interval history ;Derrick [name] has also a monoclonal gammopathy and has been treated with rituximab the medication had continuously improved his exercise tolerance, finally he was able to walk 30-40 minutes a day. He has been exercising daily.  He did develop not neuropathic pain,  but some and knee pain on the right and an He has comMRI was now showing DDD on the  correspondin spinal level.  Completed his rituximab treatment with Dr. [name]. in February 2014 .He underwent a total of 3 steroid injections at the L5-S1 distribution which had meanwhile shown a benefit. He has been on gabapentin for neuropathic pain, seems to be related to his MGUS diagnosis .He had developed a severe cold intolerance progressively actually the last year. He has noticed since March 2014  and since his Retuximab treatment was completed, that he has some cold cramping as well and toe cramps,  pain and numbness.  Last Time I ordered NCS and EMG.   Dr Jannifer Franklin did a EMG and NCS, and found axonal damage , not a finding of CIDP.  This was after 3 years of MGUS treatment and  I answered his questions, "did I ever have CIDP'? I believe ,yes.  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  The patient has hp pain but is ambulatory, he periodically loses control of his foot lifting muscles - stretching exercises help. As to his obstructive sleep apnea, there is no need for any adjustment at this time. His current settings are comfortable and has residual AHI is very low. In office download confirmed high compliance 02-13-14 . He is extremely intolerant to cold temperature, and has anhydrosis - this is more often seen in dysautonomia with NP.  He was identified as axonal neuropathy by Dr. Jannifer Franklin. I would like to follow the patient once a year for the CPAP exclusively for sleep - RV in 12 month . The CPAP  compliance report for 10-16-14 reveals a AHI of  1.2 and daily user time of  9 hours and 14 minutes, and 100% over 4 hour compliance over 4 hours of nightly use.  He has spinal stenosis - 4 times daily 400 mg neurontin.  A revisit will be scheduled in 12 months. Labs , CBC and Diff by oncologist. Cold agglutinin ordered. Plasma -Electrophoresis reordered.   03-26-15  Interval history Derrick [name] reports that he was recently babysitting the grandchildren in [place] and has noticed that he has less stamina less strength especially in his proximal leg muscles, and he was just yesterday told that he had an abnormal cardiac stress test. He has known coronary artery disease and a stent in the right main coronary artery he was now diagnosed with coronary artery disease in the left main. Catheterization is planned for Monday. As to his obstructive sleep apnea he is doing very well his compliance report from 03-26-15 shows 97% compliance for days of use and for 4 hours of use the average therapy time is 8 hours and 39 minutes at 9 cm water pressure with 2 cm EPR,  residual AHI is 1.7. Fatigue severity score is 23 and Epworth sleepiness score is 1. the patient recently has had some desire to take some daytime naps which she has not had for a long time or ever. Back from [place] , he noted a black toe nail, without history of trauma. Was this related to blood clot? Its only the nail , not the whole toe. It is tender to touch. 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 He has followed up with sinus practitioner Cecille Rubin.The patient has been a compliant  CPAP use rand today's download confirms this with 100% compliance rate up until October 21, 9 hours 8 minutes of average nocturnal use, said pressure is 9 cm water with 2 cm EPR residual AHI is 4.8 partially due to air leaks. There are some residual obstructive apneas noted. There is no treatment emergent central apnea noted. Derrick [name] needs a new CPAP machine which I would like to be an auto titrate capable CPAP.  Further developments : Ventricular tachycardia. Aortic stenosis. CAD. Cardiologist is Dr Burt Knack, electrophysiologist Dr. Garlan Fillers. MGUS- Neuropathy with falls and stumbling, gabapentin controls pain.  His Epworth score is endorsed at one point his fatigue severity at 29 points and his depression score at 1 out of 15 points.   Interval history from 26 January 2018, before he had a good Christmas time with his family, visiting grandchildren, and is here today for a routine visit dedicated to CPAP compliance and memory concerns.  The patient is a highly compliant patient using the machine average 8 hours and 7 minutes, and used the machine for the last 25 out of 30 days, has another CPAP at his second home which she used on the remaining 5 days.  Residual AHI is 1.7, there are mild to moderate leaks, the AutoSet is allowing a pressure between 5  and 12 cmH2O with 3 cm EPR;  there is no evidence of central apnea emerging.  This is a very good report. Part 2 of today's meeting is dedicated to his concerns about cognitive function; the patient participated in a [place] cognitive assessment, reaching 24 out of 30 points.  The patient mentioned that he took gabapentin just about an hour ago and that he feels quite sedated cognitively slowed by this medication.  Most importantly was fully oriented, serial 7 did very well, could not name enough F words ,but 3 out of 5 recall words. He reports trouble with names and nouns.    REVIEW OF SYSTEMS: Full 14 system review of systems performed and notable only for those listed, all others are neg:  Constitutional: neg  Cardiovascular: neg Ear/Nose/Throat: Hard of hearing Skin: neg Eyes: neg Respiratory: neg Gastroitestinal: neg  Hematology/Lymphatic: n easy bruising Endocrine: Intolerance to cold Musculoskeletal: Walking difficulty due to neuropathy Allergy/Immunology: Environmental allergies Neurological: neg Psychiatric: neg Sleep : Obstructive sleep Apnea with CPAP   ALLERGIES: No Known Allergies  HOME MEDICATIONS: Outpatient Medications Prior to Visit  Medication Sig Dispense Refill  . albuterol (PROVENTIL HFA;VENTOLIN HFA) 108 (90 BASE) MCG/ACT inhaler Inhale 2 puffs into the lungs every 6 (six) hours as needed for wheezing or shortness of breath.    [name] amLODipine (NORVASC) 10 MG tablet Take 1 tablet (10 mg total) by mouth daily. 90 tablet 3  . atorvastatin (LIPITOR) 20 MG tablet TAKE 1 TABLET BY MOUTH ONCE DAILY 90 tablet 2  . bimatoprost (LUMIGAN) 0.01 % SOLN Place 1 drop into both eyes at bedtime.    . budesonide (PULMICORT) 180 MCG/ACT inhaler Inhale 2 puffs 2 (two) times daily as needed into the lungs (wheezing or shortness of breath).    . CHOLECALCIFEROL PO Take 1,000 mg by mouth daily. Vitamin D 3    . dorzolamide-timolol (COSOPT) 22.3-6.8 MG/ML ophthalmic solution Place 1  drop into both eyes 2 (two) times daily.     [name] dronedarone (MULTAQ) 400 MG tablet Take 1 tablet (400 mg total) by mouth 2 (two) times daily with a meal. 180 tablet 3  . ELIQUIS 5 MG TABS tablet TAKE 1 TABLET  BY MOUTH TWICE DAILY 180 tablet 2  . finasteride (PROSCAR) 5 MG tablet Take 5 mg by mouth daily.     . fluticasone (FLONASE) 50 MCG/ACT nasal spray Place 2 sprays into both nostrils 2 (two) times daily.    [name] gabapentin (NEURONTIN) 800 MG tablet Take 0.5-1 tablets (400-800 mg total) by mouth 3 (three) times daily. 270 tablet 2  . pantoprazole (PROTONIX) 40 MG tablet TAKE 1 TABLET BY MOUTH ONCE DAILY 90 tablet 2  . tadalafil (CIALIS) 5 MG tablet Take 5 mg by mouth daily. Reported on 05/26/2016    . triamterene-hydrochlorothiazide (MAXZIDE-25) 37.5-25 MG tablet Take 1 tablet by mouth daily. 90 tablet 3   Facility-Administered Medications Prior to Visit  Medication Dose Route Frequency Provider Last Rate Last Dose  . 0.9 %  sodium chloride infusion  500 mL Intravenous Continuous Pyrtle, Lajuan Lines, MD        PAST MEDICAL HISTORY: Past Medical History:  Diagnosis Date  . BENIGN PROSTATIC HYPERTROPHY, WITH OBSTRUCTION 05/28/2010  . CAD (coronary artery disease)    a. s/p NSTEMI 06/01/11: DES to RCA;  b. cath 06/25/11:   dLM 50-60% (FFR 0.87), prox to mid LAD 40-50%, D1 50%, pCFX 50%, RCA stent ok, dPDA 80%, EF 55-60%.  His FFR was felt to be negative and therefore medical therapy was recommended ;  echo 6/12: EF 55-60%, mild AS   . Chronic back pain   . 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 Colon cancer ([place]) dx'd 2000   "left"  . COLONIC POLYPS, ADENOMATOUS, HX OF 05/28/2010  . Complication of anesthesia    "stopped breathing; related to my sleep apnea"   . COPD (chronic obstructive pulmonary disease) ([place])    "associated w/lung sarcoidosis"  . 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 DISH (diffuse idiopathic skeletal hyperostosis) 12/02/2015  . Diverticulitis     . GENERALIZED OSTEOARTHROSIS UNSPECIFIED SITE 05/28/2010  . GERD 05/28/2010  . Heart murmur   . History of gout   . HYPERLIPIDEMIA 05/28/2010  . Hypertension   . IgM lambda paraproteinemia   . 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 Malignant neoplasm of descending colon ([place]) 05/28/2010  . MITRAL VALVE PROLAPSE 05/28/2010  . 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 NSTEMI (non-ST elevated myocardial infarction) ([place]) 06/03/11  . 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  . OSA on CPAP 05/28/2010  . PALPITATIONS, CHRONIC 05/28/2010  . Parotid gland pain 2010   infection  . Peripheral neuropathy    "tx'd w/targeted chemo" (05/12/2016)  . Pneumonia 2-3 times  . Presence of permanent cardiac pacemaker   . 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 Sarcoidosis    pulmonalis  . UNSPECIFIED INFLAMMATORY AND TOXIC NEUROPATHY 05/28/2010  . Vision loss   . VITAMIN D DEFICIENCY 05/28/2010    PAST SURGICAL HISTORY: Past Surgical History:  Procedure Laterality Date  . APPENDECTOMY  1954  . CATARACT EXTRACTION W/ INTRAOCULAR LENS IMPLANT Left   . COLON SURGERY  2000   decending colon   . CORONARY ANGIOPLASTY WITH STENT PLACEMENT  06/03/11  . ELECTROPHYSIOLOGIC STUDY N/A 05/12/2016   Procedure: Cardioversion;  Surgeon: [name], MD;  Location: [facility];  Service: Cardiovascular;  Laterality: N/A;  . EP IMPLANTABLE DEVICE N/A 05/12/2016   Procedure: Pacemaker Implant;  Surgeon: [name], MD;  Location: [facility];  Service: Cardiovascular;  Laterality: N/A;  . INSERT / REPLACE / REMOVE PACEMAKER  05/12/2016  .  KNEE ARTHROSCOPY Right 2003  . LEFT HEART CATHETERIZATION WITH CORONARY ANGIOGRAM N/A 03/31/2015   Procedure: LEFT HEART CATHETERIZATION WITH CORONARY ANGIOGRAM;  Surgeon: [name], MD;  Location: [facility];  Service: Cardiovascular;  Laterality: N/A;  . LUNG SURGERY  2001   "open lung dissection"  . MOHS  SURGERY Left ~ 2008   ear  . PROSTATE BIOPSY  2010  . TONSILLECTOMY  1950    FAMILY HISTORY: Family History  Problem Relation Age of Onset  . Arthritis Mother        severe  . Coronary artery disease Mother   . Colon polyps Mother   . Heart disease Mother   . Coronary artery disease Brother   . Cancer Brother        choriocarcinoma  . Arrhythmia Brother 27       Afib/Tachycardia  . Heart attack Brother     SOCIAL HISTORY: Social History   Socioeconomic History  . Marital status: Married    Spouse name: Not on file  . Number of children: 2  . Years of education: MS-Coll.  . Highest education level: Not on file  Social Needs  . Financial resource strain: Not on file  . Food insecurity - worry: Not on file  . Food insecurity - inability: Not on file  . Transportation needs - medical: Not on file  . Transportation needs - non-medical: Not on file  Occupational History  . Occupation: Optometrist    Comment: Building services engineer  Tobacco Use  . Smoking status: Never Smoker  . Smokeless tobacco: Never Used  Substance and Sexual Activity  . Alcohol use: Yes    Alcohol/week: 8.4 oz    Types: 14 Glasses of wine per week    Comment: 05/12/2016 "10oz wine qd"  . Drug use: No  . Sexual activity: Yes    Birth control/protection: None  Other Topics Concern  . Not on file  Social History Narrative   College- [employer]; [employer]-environment science and mgt. Married [name])  "75" 1 son- 49; 1 dtr "88" 2 grandchildren. 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 End of life-provided discussive context and provided packet.     PHYSICAL EXAM  Vitals:   03/01/18 0817  BP: 120/76  Pulse: 79  Weight: 197 lb 9.6 oz (89.6 kg)   Body mass index is 28.35 kg/m.  Generalized: Well developed, obese male in no acute distress  Head: normocephalic and atraumatic,. Oropharynx benign  Neck: Supple, no carotid bruits  Cardiac: Irregularly irregular  rate rhythm, no murmur    Musculoskeletal: No deformity   Neurological examination   Mentation: Alert oriented to time, place, history taking. Attention span and concentration appropriate. Recent and remote memory intact.  Follows all commands speech and language fluent.   Cranial nerve II-XII: .Pupils were equal round reactive to light extraocular movements were full, visual field were full on confrontational test. Facial sensation and strength were normal. hearing was intact to finger rubbing bilaterally. Uvula tongue midline. head turning and shoulder shrug were normal and symmetric.Tongue protrusion into cheek strength was normal. Motor: normal bulk and tone, full strength in the [name], BLE, fine finger movements normal, no pronator drift.  Left foot drop Sensory: Decreased to fine touch  pinprick, and  Vibration, and proprioception Coordination: finger-nose-finger, heel-to-shin bilaterally, no dysmetria Reflexes: Diminished and symmetric plantar responses were flexor bilaterally. Gait and Station: Rising up from seated position without assistance, normal stance,  moderate stride, good arm swing, smooth turning, able to perform  tiptoe, and heel walking without difficulty. Tandem gait is steady  DIAGNOSTIC DATA (LABS, IMAGING, TESTING) - I reviewed patient records, labs, notes, testing and imaging myself where available.  Lab Results  Component Value Date   WBC 5.1 03/29/2017   HGB 13.9 03/29/2017   HCT 40.3 03/29/2017   MCV 90.1 03/29/2017   PLT 196.0 03/29/2017      Component Value Date/Time   NA 139 03/29/2017 1011   NA 141 07/27/2016 0748   K 3.9 03/29/2017 1011   K 3.9 07/27/2016 0748   CL 100 03/29/2017 1011   CL 104 03/23/2013 0855   CO2 32 03/29/2017 1011   CO2 30 (H) 07/27/2016 0748   GLUCOSE 74 03/29/2017 1011   GLUCOSE 122 07/27/2016 0748   GLUCOSE 98 03/23/2013 0855   BUN 19 03/29/2017 1011   BUN 16.0 07/27/2016 0748   CREATININE 1.01 03/29/2017 1011   CREATININE 1.0 07/27/2016 0748    CALCIUM 10.2 03/29/2017 1011   CALCIUM 10.5 (H) 07/27/2016 0748   PROT 6.6 07/25/2017 0745   PROT 7.5 07/27/2016 0748   ALBUMIN 4.4 04/04/2017 1405   ALBUMIN 4.3 07/27/2016 0748   AST 17 04/04/2017 1405   AST 24 07/27/2016 0748   ALT 15 04/04/2017 1405   ALT 19 07/27/2016 0748   ALKPHOS 54 04/04/2017 1405   ALKPHOS 53 07/27/2016 0748   BILITOT 0.4 04/04/2017 1405   BILITOT 0.84 07/27/2016 0748   GFRNONAA >60 06/05/2011 0350   GFRAA >60 06/05/2011 0350   Lab Results  Component Value Date   CHOL 164 04/04/2017   HDL 57 04/04/2017   LDLCALC 90 04/04/2017   TRIG 85 04/04/2017   CHOLHDL 2.9 04/04/2017   Lab Results  Component Value Date   HGBA1C 5.0 03/29/2017    Lab Results  Component Value Date   TSH 3.02 03/29/2017      ASSESSMENT AND PLAN 77 year old male with history of neuropathy with progression after rituximab treatment was stopped now 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  He has hyperpathia, hyperesthesias and dyseasthesias have increased.  He has a left foot drop.  This is continued to be monitored by his hematologist Dr.Gorsuch.  He Also Has a History of Obstructive Sleep Apnea with a new CPAP machine here for compliance.Data dated 01/29/2018-02/27/2018 shows compliance greater than 4 hours at 30 days for 100%.  Average usage 9 hours 35 minutes.  Pressure settings 5-12 cm .  EPR level 3 AHI 1.4.  He is using nasal pillows ESS 2.  [facility] [employer].   PLAN: CPAP compliance 100% Continue same settings  F/U in 6 months Derrick [name], [facility], [facility], APRN  [facility] [address], [place], [place] [address] [phone]

## 2018-03-01 ENCOUNTER — Encounter: Payer: Self-pay | Admitting: Nurse Practitioner

## 2018-03-01 ENCOUNTER — Ambulatory Visit (INDEPENDENT_AMBULATORY_CARE_PROVIDER_SITE_OTHER): Payer: Medicare Other | Admitting: Nurse Practitioner

## 2018-03-01 VITALS — BP 120/76 | HR 79 | Wt 197.6 lb

## 2018-03-01 DIAGNOSIS — G4733 Obstructive sleep apnea (adult) (pediatric): Secondary | ICD-10-CM | POA: Diagnosis not present

## 2018-03-01 DIAGNOSIS — Z9989 Dependence on other enabling machines and devices: Secondary | ICD-10-CM

## 2018-03-01 NOTE — Patient Instructions (Signed)
CPAP compliance 100% Continue same settings  F/U in 6 months

## 2018-03-03 ENCOUNTER — Encounter: Payer: Self-pay | Admitting: Nurse Practitioner

## 2018-03-03 NOTE — Progress Notes (Signed)
I agree with the assessment and plan as directed by NP .The patient is well known to me . Dx; OSA on CPAP , compliant user, also MGUS and neuropathy.   Kroy Sprung, MD

## 2018-03-06 ENCOUNTER — Other Ambulatory Visit: Payer: Self-pay | Admitting: Nurse Practitioner

## 2018-03-06 DIAGNOSIS — G4733 Obstructive sleep apnea (adult) (pediatric): Secondary | ICD-10-CM

## 2018-03-06 DIAGNOSIS — Z9989 Dependence on other enabling machines and devices: Secondary | ICD-10-CM

## 2018-03-06 NOTE — Progress Notes (Signed)
DME orders for CPAP changes re:  large nasal pillows and change pressure to 5-15 cm successfully faxed to [place].

## 2018-03-22 DIAGNOSIS — L814 Other melanin hyperpigmentation: Secondary | ICD-10-CM | POA: Diagnosis not present

## 2018-03-22 DIAGNOSIS — Z23 Encounter for immunization: Secondary | ICD-10-CM | POA: Diagnosis not present

## 2018-03-22 DIAGNOSIS — D1800 Hemangioma unspecified site: Secondary | ICD-10-CM | POA: Diagnosis not present

## 2018-03-22 DIAGNOSIS — Z85828 Personal history of other malignant neoplasm of skin: Secondary | ICD-10-CM | POA: Diagnosis not present

## 2018-03-22 DIAGNOSIS — D225 Melanocytic nevi of trunk: Secondary | ICD-10-CM | POA: Diagnosis not present

## 2018-03-22 DIAGNOSIS — L821 Other seborrheic keratosis: Secondary | ICD-10-CM | POA: Diagnosis not present

## 2018-03-27 ENCOUNTER — Ambulatory Visit (INDEPENDENT_AMBULATORY_CARE_PROVIDER_SITE_OTHER): Payer: Medicare Other | Admitting: *Deleted

## 2018-03-27 DIAGNOSIS — R001 Bradycardia, unspecified: Secondary | ICD-10-CM | POA: Diagnosis not present

## 2018-03-27 NOTE — Progress Notes (Signed)
Remote pacemaker transmission.   

## 2018-03-28 ENCOUNTER — Encounter: Payer: Self-pay | Admitting: Cardiology

## 2018-03-29 LAB — CUP PACEART REMOTE DEVICE CHECK
Battery Remaining Longevity: 80 mo
Battery Voltage: 3.01 V
Brady Statistic AP VP Percent: 98.19 %
Brady Statistic AP VS Percent: 0.03 %
Brady Statistic AS VP Percent: 1.78 %
Brady Statistic AS VS Percent: 0 %
Brady Statistic RA Percent Paced: 98.09 %
Brady Statistic RV Percent Paced: 99.67 %
Date Time Interrogation Session: 20190401124104
Implantable Lead Implant Date: 20170517
Implantable Lead Implant Date: 20170517
Implantable Lead Location: 753859
Implantable Lead Location: 753860
Implantable Lead Model: 5076
Implantable Lead Model: 5076
Implantable Pulse Generator Implant Date: 20170517
Lead Channel Impedance Value: 380 Ohm
Lead Channel Impedance Value: 475 Ohm
Lead Channel Impedance Value: 513 Ohm
Lead Channel Impedance Value: 627 Ohm
Lead Channel Pacing Threshold Amplitude: 0.75 V
Lead Channel Pacing Threshold Amplitude: 0.75 V
Lead Channel Pacing Threshold Pulse Width: 0.4 ms
Lead Channel Pacing Threshold Pulse Width: 0.4 ms
Lead Channel Sensing Intrinsic Amplitude: 15.75 mV
Lead Channel Sensing Intrinsic Amplitude: 15.75 mV
Lead Channel Sensing Intrinsic Amplitude: 2.875 mV
Lead Channel Sensing Intrinsic Amplitude: 2.875 mV
Lead Channel Setting Pacing Amplitude: 2 V
Lead Channel Setting Pacing Amplitude: 2.5 V
Lead Channel Setting Pacing Pulse Width: 0.4 ms
Lead Channel Setting Sensing Sensitivity: 2.8 mV

## 2018-04-10 DIAGNOSIS — E669 Obesity, unspecified: Secondary | ICD-10-CM | POA: Diagnosis not present

## 2018-04-10 DIAGNOSIS — M25561 Pain in right knee: Secondary | ICD-10-CM | POA: Diagnosis not present

## 2018-04-10 DIAGNOSIS — M150 Primary generalized (osteo)arthritis: Secondary | ICD-10-CM | POA: Diagnosis not present

## 2018-04-10 DIAGNOSIS — Z6830 Body mass index (BMI) 30.0-30.9, adult: Secondary | ICD-10-CM | POA: Diagnosis not present

## 2018-04-10 DIAGNOSIS — M2550 Pain in unspecified joint: Secondary | ICD-10-CM | POA: Diagnosis not present

## 2018-04-10 DIAGNOSIS — M5489 Other dorsalgia: Secondary | ICD-10-CM | POA: Diagnosis not present

## 2018-04-11 ENCOUNTER — Other Ambulatory Visit: Payer: Self-pay | Admitting: Cardiovascular Disease

## 2018-04-11 DIAGNOSIS — I4891 Unspecified atrial fibrillation: Secondary | ICD-10-CM

## 2018-04-19 ENCOUNTER — Encounter: Payer: Self-pay | Admitting: Cardiology

## 2018-04-19 ENCOUNTER — Ambulatory Visit (INDEPENDENT_AMBULATORY_CARE_PROVIDER_SITE_OTHER): Payer: Medicare Other | Admitting: Cardiology

## 2018-04-19 VITALS — BP 120/66 | HR 64 | Ht 70.0 in | Wt 198.2 lb

## 2018-04-19 DIAGNOSIS — R001 Bradycardia, unspecified: Secondary | ICD-10-CM

## 2018-04-19 DIAGNOSIS — I2510 Atherosclerotic heart disease of native coronary artery without angina pectoris: Secondary | ICD-10-CM | POA: Diagnosis not present

## 2018-04-19 DIAGNOSIS — E785 Hyperlipidemia, unspecified: Secondary | ICD-10-CM | POA: Diagnosis not present

## 2018-04-19 DIAGNOSIS — I442 Atrioventricular block, complete: Secondary | ICD-10-CM

## 2018-04-19 DIAGNOSIS — Z950 Presence of cardiac pacemaker: Secondary | ICD-10-CM

## 2018-04-19 DIAGNOSIS — I10 Essential (primary) hypertension: Secondary | ICD-10-CM

## 2018-04-19 NOTE — Progress Notes (Signed)
Electrophysiology Office Note   Date:  04/19/2018   ID:  Derrick [name], DOB 22-Nov-1941, MRN [id number]  PCP:  Derrick Sanders, MD  Cardiologist:  Derrick [name] Primary Electrophysiologist:  Derrick Milligan [name], MD    Chief Complaint  Patient presents with  . Pacemaker Check    PAF     History of Present Illness: Derrick [name] is a 77 y.o. male who presents today for electrophysiology evaluation.   He has been followed for coronary artery disease. He has a history of myocardial infarction and drug-eluting stent placement in the right coronary artery. 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 He's been managed medically. He has multiple noncardiac problems including CIDP, pulmonary sarcoid, and monoclonal gammopathy of undetermined significance. He last underwent cardiac catheterization in April 2016 showing stable coronary anatomy. He had a pacemaker implanted for tachybradycardia syndrome on 05/12/16. He has been maintained on Multaq since that time with maintenance of sinus rhythm.   Today, denies symptoms of palpitations, shortness of breath, orthopnea, PND, lower extremity edema, claudication, dizziness, presyncope, syncope, bleeding, or neurologic sequela. The patient is tolerating medications without difficulties.  Overall he is feeling well.  He does get occasional chest pain.  The pain lasts for up to 30 seconds at a time.  It happens a few times a week.  It is in the left side of his chest without radiation.  It is not associated with exertion.   Past Medical History:  Diagnosis Date  . BENIGN PROSTATIC HYPERTROPHY, WITH OBSTRUCTION 05/28/2010  . CAD (coronary artery disease)    a. s/p NSTEMI 06/01/11: DES to RCA;  b. cath 06/25/11:   dLM 50-60% (FFR 0.87), prox to mid LAD 40-50%, D1 50%, pCFX 50%, RCA stent ok, dPDA 80%, EF 55-60%.  His FFR was felt to be negative and therefore medical therapy was recommended ;  echo 6/12: EF  55-60%, mild AS   . Chronic back pain   . CIDP (chronic inflammatory demyelinating polyneuropathy) ([place]) 04/11/2012  . Colon cancer ([place]) dx'd 2000   "left"  . COLONIC POLYPS, ADENOMATOUS, HX OF 05/28/2010  . Complication of anesthesia    "stopped breathing; related to my sleep apnea"   . COPD (chronic obstructive pulmonary disease) ([place])    "associated w/lung sarcoidosis"  . 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 DISH (diffuse idiopathic skeletal hyperostosis) 12/02/2015  . Diverticulitis   . GENERALIZED OSTEOARTHROSIS UNSPECIFIED SITE 05/28/2010  . GERD 05/28/2010  . Heart murmur   . History of gout   . HYPERLIPIDEMIA 05/28/2010  . Hypertension   . IgM lambda paraproteinemia   . 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 Malignant neoplasm of descending colon ([place]) 05/28/2010  . MITRAL VALVE PROLAPSE 05/28/2010  . 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 NSTEMI (non-ST elevated myocardial infarction) ([place]) 06/03/11  . 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  . OSA on CPAP 05/28/2010  . PALPITATIONS, CHRONIC 05/28/2010  . Parotid gland pain 2010   infection  . Peripheral neuropathy    "tx'd w/targeted chemo" (05/12/2016)  . Pneumonia 2-3 times  . Presence of permanent cardiac pacemaker   . 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 Sarcoidosis    pulmonalis  . UNSPECIFIED INFLAMMATORY AND TOXIC NEUROPATHY 05/28/2010  . Vision loss   . VITAMIN D DEFICIENCY 05/28/2010   Past Surgical History:  Procedure Laterality Date  . APPENDECTOMY  1954  . CATARACT EXTRACTION W/ INTRAOCULAR LENS IMPLANT Left   .  COLON SURGERY  2000   decending colon   . CORONARY ANGIOPLASTY WITH STENT PLACEMENT  06/03/11  . ELECTROPHYSIOLOGIC STUDY N/A 05/12/2016   Procedure: Cardioversion;  Surgeon: Derrick Nickson [name], MD;  Location: [place] [facility];  Service: Cardiovascular;  Laterality: N/A;  . EP IMPLANTABLE DEVICE N/A 05/12/2016   Procedure: Pacemaker Implant;   Surgeon: Derrick Faulkenberry [name], MD;  Location: [place] [facility];  Service: Cardiovascular;  Laterality: N/A;  . INSERT / REPLACE / REMOVE PACEMAKER  05/12/2016  . KNEE ARTHROSCOPY Right 2003  . LEFT HEART CATHETERIZATION WITH CORONARY ANGIOGRAM N/A 03/31/2015   Procedure: LEFT HEART CATHETERIZATION WITH CORONARY ANGIOGRAM;  Surgeon: Derrick Mocha, MD;  Location: [facility] CATH LAB;  Service: Cardiovascular;  Laterality: N/A;  . LUNG SURGERY  2001   "open lung dissection"  . MOHS SURGERY Left ~ 2008   ear  . PROSTATE BIOPSY  2010  . TONSILLECTOMY  1950     Current Outpatient Medications  Medication Sig Dispense Refill  . albuterol (PROVENTIL HFA;VENTOLIN HFA) 108 (90 BASE) MCG/ACT inhaler Inhale 2 puffs into the lungs every 6 (six) hours as needed for wheezing or shortness of breath.    [name] amLODipine (NORVASC) 10 MG tablet Take 1 tablet (10 mg total) by mouth daily. 90 tablet 3  . atorvastatin (LIPITOR) 20 MG tablet Take 1 tablet (20 mg total) by mouth daily. Please keep upcoming appt. Thank you 90 tablet 2  . bimatoprost (LUMIGAN) 0.01 % SOLN Place 1 drop into both eyes at bedtime.    . budesonide (PULMICORT) 180 MCG/ACT inhaler Inhale 2 puffs 2 (two) times daily as needed into the lungs (wheezing or shortness of breath).    . CHOLECALCIFEROL PO Take 1,000 mg by mouth daily. Vitamin D 3    . dorzolamide-timolol (COSOPT) 22.3-6.8 MG/ML ophthalmic solution Place 1 drop into both eyes 2 (two) times daily.     [name] dronedarone (MULTAQ) 400 MG tablet Take 1 tablet (400 mg total) by mouth 2 (two) times daily with a meal. 180 tablet 3  . ELIQUIS 5 MG TABS tablet TAKE 1 TABLET BY MOUTH TWICE DAILY 180 tablet 2  . finasteride (PROSCAR) 5 MG tablet Take 5 mg by mouth daily.     . fluticasone (FLONASE) 50 MCG/ACT nasal spray Place 2 sprays into both nostrils 2 (two) times daily.    [name] gabapentin (NEURONTIN) 800 MG tablet Take 0.5-1 tablets (400-800 mg total) by mouth 3 (three) times daily. 270 tablet 2  .  pantoprazole (PROTONIX) 40 MG tablet Take 1 tablet (40 mg total) by mouth daily. 90 tablet 2  . tadalafil (CIALIS) 5 MG tablet Take 5 mg by mouth daily. Reported on 05/26/2016    . triamterene-hydrochlorothiazide (MAXZIDE-25) 37.5-25 MG tablet Take 1 tablet by mouth daily. 90 tablet 3   Current Facility-Administered Medications  Medication Dose Route Frequency Provider Last Rate Last Dose  . 0.9 %  sodium chloride infusion  500 mL Intravenous Continuous Pyrtle, Lajuan Lines, MD        Allergies:   Patient has no known allergies.   Social History:  The patient  reports that he has never smoked. He has never used smokeless tobacco. He reports that he drinks about 8.4 oz of alcohol per week. He reports that he does not use drugs.   Family History:  The patient's family history includes Arrhythmia (age of onset: 61) in his brother; Arthritis in his mother; Cancer in his brother; Colon polyps in his mother; Coronary artery  disease in his brother and mother; Heart attack in his brother; Heart disease in his mother.   ROS:  Please see the history of present illness.   Otherwise, review of systems is positive for chest pain.   All other systems are reviewed and negative.   PHYSICAL EXAM: VS:  BP 120/66   Pulse 64   Ht 5\' 10"  (1.778 m)   Wt 198 lb 3.2 oz (89.9 kg)   SpO2 98%   BMI 28.44 kg/m  , BMI Body mass index is 28.44 kg/m. GEN: Well nourished, well developed, in no acute distress  HEENT: normal  Neck: no JVD, carotid bruits, or masses Cardiac: RRR; no murmurs, rubs, or gallops,no edema  Respiratory:  clear to auscultation bilaterally, normal work of breathing GI: soft, nontender, nondistended, + BS MS: no deformity or atrophy  Skin: warm and dry, device site well healed Neuro:  Strength and sensation are intact Psych: euthymic mood, full affect  EKG:  EKG is ordered today. Personal review of the ekg ordered shows AV paced  Personal review of the device interrogation today. Results in  [place]: No results found for requested labs within last 8760 hours.    Lipid Panel     Component Value Date/Time   CHOL 164 04/04/2017 1405   TRIG 85 04/04/2017 1405   HDL 57 04/04/2017 1405   CHOLHDL 2.9 04/04/2017 1405   CHOLHDL 2.7 03/19/2016 0922   VLDL 14 03/19/2016 0922   LDLCALC 90 04/04/2017 1405     Wt Readings from Last 3 Encounters:  04/19/18 198 lb 3.2 oz (89.9 kg)  03/01/18 197 lb 9.6 oz (89.6 kg)  01/26/18 201 lb (91.2 kg)      Other studies Reviewed: Additional studies/ records that were reviewed today include: 03/31/15 Cath, 04/13/16 TTE, 03/31/16 Holter Review of the above records today demonstrates:   Final Conclusions:  1. Moderate distal left mainstem disease, without significant progression from the patient's 2012 cardiac catheterization study 2. Mild to moderate diffusely calcified stenosis of the LAD 3. Mild nonobstructive stenosis of the left circumflex 4. Continued stent patency of the RCA with moderate PDA stenosis 5. Preserved LV systolic function  - Left ventricle: The cavity size was normal. Wall thickness was  normal. Systolic function was vigorous. The estimated ejection  fraction was in the range of 65% to 70%. Wall motion was normal;  there were no regional wall motion abnormalities. The study is  not technically sufficient to allow evaluation of LV diastolic  function. - Aortic valve: Trileaflet; mildly calcified leaflets. Mild  stenosis. Mean gradient (S): 12 mm Hg. Peak gradient (S): 22 mm  Hg. Valve area (VTI): 2.58 cm^2. Valve area (Vmax): 2.92 cm^2.  Valve area (Vmean): 2.91 cm^2. - Aorta: Ascending aortic diameter: 41 mm (S). - Ascending aorta: The ascending aorta is dilated. - Mitral valve: Mildly thickened leaflets . There was mild  regurgitation. - Left atrium: Severely dilated at 57 ml/m2. - Right ventricle: The cavity size was mildly dilated. - Right atrium: Severely dilated at 27 cm2. - Tricuspid  valve: There was mild regurgitation. - Pulmonary arteries: PA peak pressure: 35 mm Hg (S). - Inferior vena cava: The vessel was dilated. The respirophasic  diameter changes were blunted (< 50%), consistent with elevated  central venous pressure.   The basic rhythm is atrial fibrillation  There are aberrated beats or PVC's and a short run of wide-complex tachycardia, suspect AF with aberrancy  There are episodes of  slow atrial fibrillation   ASSESSMENT AND PLAN:  1.  Atrial fibrillation: Currently on Eliquis and Multaq.  Remains in sinus rhythm.  Intermittently atrial pacing.  No changes.    This patients CHA2DS2-VASc Score and unadjusted Ischemic Stroke Rate (% per year) is equal to 3.2 % stroke rate/year from a score of 3  Above score calculated as 1 point each if present [CHF, HTN, DM, Vascular=MI/PAD/Aortic Plaque, Age if 65-74, or Male] Above score calculated as 2 points each if present [Age > 75, or Stroke/TIA/TE]   2. Hypertension: Well controlled.  No changes.  3. Hyperlipidemia: Continue statin  4. Coronary artery disease: Has occasional chest pain that occurs for up to 30 seconds a few times a week.  At this point he does not wish further intervention.  I told him that we could start nitrates if his pain worsens.  I Johnston Maddocks discuss this with his interventional cardiologist.  5. Sick sinus syndrome: Current dual-chamber pacemaker implanted 05/12/2016.  Functioning appropriately.  No changes.    Current medicines are reviewed at length with the patient today.   The patient does not have concerns regarding his medicines.  The following changes were made today: None 6  Labs/ tests ordered today include:  No orders of the defined types were placed in this encounter.    Disposition:   FU with Jocee Kissick 6 months  Signed, Mashawn Brazil [name], MD  04/19/2018 9:12 AM     [facility] [address] [place] [address] [phone]  (office) [phone] (fax)

## 2018-04-19 NOTE — Patient Instructions (Addendum)
Medication Instructions:  Your physician recommends that you continue on your current medications as directed. Please refer to the Current Medication list given to you today.  *If you need a refill on your cardiac medications before your next appointment, please call your pharmacy*  Labwork: None ordered  Testing/Procedures: None ordered  Follow-Up: Remote monitoring is used to monitor your Pacemaker or ICD from home. This monitoring reduces the number of office visits required to check your device to one time per year. It allows [place] to keep an eye on the functioning of your device to ensure it is working properly. You are scheduled for a device check from home on 06/26/2018. You may send your transmission at any time that day. If you have a wireless device, the transmission will be sent automatically. After your physician reviews your transmission, you will receive a postcard with your next transmission date.  Your physician wants you to follow-up in: 6 months with Dr. Curt Bears.  You will receive a reminder letter in the mail two months in advance. If you don't receive a letter, please call our office to schedule the follow-up appointment.  Thank you for choosing [facility]!!   Trinidad Curet, RN [phone]

## 2018-04-24 ENCOUNTER — Other Ambulatory Visit: Payer: Self-pay | Admitting: Cardiology

## 2018-04-24 ENCOUNTER — Other Ambulatory Visit: Payer: Self-pay | Admitting: Cardiovascular Disease

## 2018-04-24 DIAGNOSIS — I4891 Unspecified atrial fibrillation: Secondary | ICD-10-CM

## 2018-04-24 NOTE — Addendum Note (Signed)
Addended by: Stanton Kidney on: 04/24/2018 01:29 PM   Modules accepted: Orders

## 2018-04-25 NOTE — Telephone Encounter (Signed)
Eliquis 5mg  refill request received; pt is 77 yrs old, wt-89.9kg, Crea-0.83 on 12/07/17 via [facility], last seen by Dr. Curt Bears on 04/19/18; will send in refill to requested pharmacy.

## 2018-04-28 DIAGNOSIS — H401122 Primary open-angle glaucoma, left eye, moderate stage: Secondary | ICD-10-CM | POA: Diagnosis not present

## 2018-04-28 DIAGNOSIS — H401112 Primary open-angle glaucoma, right eye, moderate stage: Secondary | ICD-10-CM | POA: Diagnosis not present

## 2018-05-03 ENCOUNTER — Ambulatory Visit (INDEPENDENT_AMBULATORY_CARE_PROVIDER_SITE_OTHER): Payer: Medicare Other | Admitting: Cardiovascular Disease

## 2018-05-03 ENCOUNTER — Encounter: Payer: Self-pay | Admitting: Cardiovascular Disease

## 2018-05-03 VITALS — BP 108/66 | HR 67 | Ht 70.0 in | Wt 196.0 lb

## 2018-05-03 DIAGNOSIS — I25119 Atherosclerotic heart disease of native coronary artery with unspecified angina pectoris: Secondary | ICD-10-CM | POA: Diagnosis not present

## 2018-05-03 DIAGNOSIS — I480 Paroxysmal atrial fibrillation: Secondary | ICD-10-CM | POA: Diagnosis not present

## 2018-05-03 DIAGNOSIS — I2510 Atherosclerotic heart disease of native coronary artery without angina pectoris: Secondary | ICD-10-CM | POA: Diagnosis not present

## 2018-05-03 NOTE — Patient Instructions (Signed)
Medication Instructions:  Your provider recommends that you continue on your current medications as directed. Please refer to the Current Medication list given to you today.    Labwork: Your provider recommends that you have FASTING lab work the same day as your stress test.   Testing/Procedures: Your provider has requested that you have a lexiscan myoview. For further information please visit [email or web address]. Please follow instruction sheet, as given.  Follow-Up: Your provider wants you to follow-up in: 6 months with Dr. Burt Knack. You will receive a reminder letter in the mail two months in advance. If you don't receive a letter, please call our office to schedule the follow-up appointment.    Any Other Special Instructions Will Be Listed Below (If Applicable).     If you need a refill on your cardiac medications before your next appointment, please call your pharmacy.

## 2018-05-03 NOTE — Progress Notes (Signed)
Cardiology Office Note Date:  05/05/2018   ID:  Derrick [name], DOB [birth year], MRN [id number]  PCP:  [name], MD  Cardiologist:  [name], MD    Chief Complaint  Patient presents with  . Coronary Artery Disease     History of Present Illness: Derrick [name] is a 77 y.o. male who presents for follow-up evaluation.  The patient is followed for coronary artery disease with history of inferior MI and primary PCI with drug-eluting stent placement in the right coronary artery.  He has moderate left main disease which is been interrogated by pressure wire analysis without significant ischemia.  He has had no recurrence of anginal symptoms.    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  Paroxysmal atrial fibrillation is managed with dronedarone and anticoagulated with apixaban. He has other medical problems including CIDP, pulmonary sarcoid, and monoclonal gammopathy of undetermined significance.  The patient is here alone today.  He is doing fairly well with stable symptoms.  He does complain of excessive fatigue.  He has an occasional chest pain localized on the left anterior chest that occurs with emotional stress.  He also has some upper back pain at times of walking but this is fairly long-standing.  He denies shortness of breath, orthopnea, PND, or heart palpitations.  He remains physically active.   Past Medical History:  Diagnosis Date  . BENIGN PROSTATIC HYPERTROPHY, WITH OBSTRUCTION 05/28/2010  . CAD (coronary artery disease)    a. s/p NSTEMI 06/01/11: DES to RCA;  b. cath 06/25/11:   dLM 50-60% (FFR 0.87), prox to mid LAD 40-50%, D1 50%, pCFX 50%, RCA stent ok, dPDA 80%, EF 55-60%.  His FFR was felt to be negative and therefore medical therapy was recommended ;  echo 6/12: EF 55-60%, mild AS   . Chronic back pain   . CIDP (chronic inflammatory demyelinating polyneuropathy) ([place]) 04/11/2012  . Colon cancer ([place]) dx'd  2000   "left"  . COLONIC POLYPS, ADENOMATOUS, HX OF 05/28/2010  . Complication of anesthesia    "stopped breathing; related to my sleep apnea"   . COPD (chronic obstructive pulmonary disease) ([place])    "associated w/lung sarcoidosis"  . 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 DISH (diffuse idiopathic skeletal hyperostosis) 12/02/2015  . Diverticulitis   . GENERALIZED OSTEOARTHROSIS UNSPECIFIED SITE 05/28/2010  . GERD 05/28/2010  . Heart murmur   . History of gout   . HYPERLIPIDEMIA 05/28/2010  . Hypertension   . IgM lambda paraproteinemia   . 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 Malignant neoplasm of descending colon ([place]) 05/28/2010  . MITRAL VALVE PROLAPSE 05/28/2010  . 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 NSTEMI (non-ST elevated myocardial infarction) ([place]) 06/03/11  . 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  . OSA on CPAP 05/28/2010  . PALPITATIONS, CHRONIC 05/28/2010  . Parotid gland pain 2010   infection  . Peripheral neuropathy    "tx'd w/targeted chemo" (05/12/2016)  . Pneumonia 2-3 times  . Presence of permanent cardiac pacemaker   . 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 Sarcoidosis    pulmonalis  . UNSPECIFIED INFLAMMATORY AND TOXIC NEUROPATHY 05/28/2010  . Vision loss   . VITAMIN D DEFICIENCY 05/28/2010    Past Surgical History:  Procedure Laterality Date  . APPENDECTOMY  1954  . CATARACT EXTRACTION W/ INTRAOCULAR LENS IMPLANT Left   . COLON SURGERY  2000   decending colon   . CORONARY ANGIOPLASTY WITH  STENT PLACEMENT  06/03/11  . ELECTROPHYSIOLOGIC STUDY N/A 05/12/2016   Procedure: Cardioversion;  Surgeon: [name], MD;  Location: [facility];  Service: Cardiovascular;  Laterality: N/A;  . EP IMPLANTABLE DEVICE N/A 05/12/2016   Procedure: Pacemaker Implant;  Surgeon: [name], MD;  Location: [facility];  Service: Cardiovascular;  Laterality: N/A;  . INSERT / REPLACE / REMOVE PACEMAKER   05/12/2016  . KNEE ARTHROSCOPY Right 2003  . LEFT HEART CATHETERIZATION WITH CORONARY ANGIOGRAM N/A 03/31/2015   Procedure: LEFT HEART CATHETERIZATION WITH CORONARY ANGIOGRAM;  Surgeon: [name], MD;  Location: [facility] CATH LAB;  Service: Cardiovascular;  Laterality: N/A;  . LUNG SURGERY  2001   "open lung dissection"  . MOHS SURGERY Left ~ 2008   ear  . PROSTATE BIOPSY  2010  . TONSILLECTOMY  1950    Current Outpatient Medications  Medication Sig Dispense Refill  . albuterol (PROVENTIL HFA;VENTOLIN HFA) 108 (90 BASE) MCG/ACT inhaler Inhale 2 puffs into the lungs every 6 (six) hours as needed for wheezing or shortness of breath.    [name] amLODipine (NORVASC) 10 MG tablet Take 1 tablet (10 mg total) by mouth daily. 90 tablet 3  . atorvastatin (LIPITOR) 20 MG tablet Take 1 tablet (20 mg total) by mouth daily. Please keep upcoming appt. Thank you 90 tablet 2  . bimatoprost (LUMIGAN) 0.01 % SOLN Place 1 drop into both eyes at bedtime.    . budesonide (PULMICORT) 180 MCG/ACT inhaler Inhale 2 puffs 2 (two) times daily as needed into the lungs (wheezing or shortness of breath).    . CHOLECALCIFEROL PO Take 1,000 mg by mouth daily. Vitamin D 3    . dorzolamide-timolol (COSOPT) 22.3-6.8 MG/ML ophthalmic solution Place 1 drop into both eyes 2 (two) times daily.     [name] ELIQUIS 5 MG TABS tablet TAKE 1 TABLET BY MOUTH TWICE DAILY 180 tablet 2  . finasteride (PROSCAR) 5 MG tablet Take 5 mg by mouth daily.     . fluticasone (FLONASE) 50 MCG/ACT nasal spray Place 2 sprays into both nostrils 2 (two) times daily.    [name] gabapentin (NEURONTIN) 800 MG tablet Take 0.5-1 tablets (400-800 mg total) by mouth 3 (three) times daily. 270 tablet 2  . MULTAQ 400 MG tablet TAKE 1 TABLET BY MOUTH TWICE DAILY WITH MEALS 180 tablet 3  . pantoprazole (PROTONIX) 40 MG tablet Take 1 tablet (40 mg total) by mouth daily. 90 tablet 2  . tadalafil (CIALIS) 5 MG tablet Take 5 mg by mouth daily. Reported on 05/26/2016    .  triamterene-hydrochlorothiazide (MAXZIDE-25) 37.5-25 MG tablet Take 1 tablet by mouth daily. 90 tablet 3   Current Facility-Administered Medications  Medication Dose Route Frequency Provider Last Rate Last Dose  . 0.9 %  sodium chloride infusion  500 mL Intravenous Continuous Pyrtle, Lajuan Lines, MD        Allergies:   Patient has no known allergies.   Social History:  The patient  reports that he has never smoked. He has never used smokeless tobacco. He reports that he drinks about 8.4 oz of alcohol per week. He reports that he does not use drugs.   Family History:  The patient's family history includes Arrhythmia (age of onset: 44) in his brother; Arthritis in his mother; Cancer in his brother; Colon polyps in his mother; Coronary artery disease in his brother and mother; Heart attack in his brother; Heart disease in his mother.    ROS:  Please  see the history of present illness.  Otherwise, review of systems is positive for slight leg swelling, abdominal pain, back pain, neuropathy, fatigue.  All other systems are reviewed and negative.    PHYSICAL EXAM: VS:  BP 108/66   Pulse 67   Ht 5\' 10"  (1.778 m)   Wt 196 lb (88.9 kg)   SpO2 97%   BMI 28.12 kg/m  , BMI Body mass index is 28.12 kg/m. GEN: Well nourished, well developed, in no acute distress  HEENT: normal  Neck: no JVD, no masses. No carotid bruits Cardiac: RRR with 2/6 systolic murmur at the right upper sternal border, no diastolic murmur               Respiratory:  clear to auscultation bilaterally, normal work of breathing GI: soft, nontender, nondistended, + BS MS: no deformity or atrophy  Ext: no pretibial edema, pedal pulses 2+= bilaterally Skin: warm and dry, no rash Neuro:  Strength and sensation are intact Psych: euthymic mood, full affect  EKG:  EKG is not ordered today.  Recent Labs: No results found for requested labs within last 8760 hours.   Lipid Panel     Component Value Date/Time   CHOL 164 04/04/2017  1405   TRIG 85 04/04/2017 1405   HDL 57 04/04/2017 1405   CHOLHDL 2.9 04/04/2017 1405   CHOLHDL 2.7 03/19/2016 0922   VLDL 14 03/19/2016 0922   LDLCALC 90 04/04/2017 1405      Wt Readings from Last 3 Encounters:  05/03/18 196 lb (88.9 kg)  04/19/18 198 lb 3.2 oz (89.9 kg)  03/01/18 197 lb 9.6 oz (89.6 kg)     Cardiac Studies Reviewed: 2D echocardiogram 05/04/2017: Study Conclusions  - Left ventricle: The cavity size was mildly dilated. Wall   thickness was increased in a pattern of mild LVH. Systolic   function was normal. The estimated ejection fraction was in the   range of 55% to 60%. Hypokinesis of the mid-apical anteroseptal,   anterior, apical inferior, and apical myocardium. Doppler   parameters are consistent with abnormal left ventricular   relaxation (grade 1 diastolic dysfunction). - Aortic valve: Valve mobility was restricted. Transvalvular   velocity was within the normal range. There was no stenosis.   There was no regurgitation. Valve area (VTI): 1.53 cm^2. Valve   area (Vmax): 1.56 cm^2. Valve area (Vmean): 1.48 cm^2. - Mitral valve: Transvalvular velocity was within the normal range.   There was no evidence for stenosis. There was trivial   regurgitation. Valve area by pressure half-time: 2.44 cm^2. - Right ventricle: The cavity size was normal. Wall thickness was   normal. Systolic function was normal. - Atrial septum: No defect or patent foramen ovale was identified. - Tricuspid valve: There was mild regurgitation. - Pulmonary arteries: Systolic pressure was within the normal   range. PA peak pressure: 21 mm Hg (S).   ASSESSMENT AND PLAN: 1.  Coronary artery disease, native vessel, with angina: The patient has typical and atypical symptoms.  With a paced rhythm, stress testing is somewhat limited.  We will refer him for a Lexiscan Myoview stress test for further risk stratification.  We will continue his current medical therapy.  2.  Paroxysmal atrial  fibrillation: Continue anticoagulation with apixaban.  The patient is status post permanent pacemaker placement with a paced rhythm.  The patient is managed with a rhythm control strategy using dronedarone.  3.  Hypertension: Blood pressure is well controlled on current medical therapy which includes  amlodipine and triamterene/hydrochlorothiazide.  4.  Hyperlipidemia: Treated with atorvastatin 20 mg.  Most recent lipids reviewed. Will arrange fasting labs when he comes in for his stress test.  Current medicines are reviewed with the patient today.  The patient does not have concerns regarding medicines.  Labs/ tests ordered today include:   Orders Placed This Encounter  Procedures  . Lipid panel  . Comprehensive metabolic panel  . CBC with Differential/Platelet  . MYOCARDIAL PERFUSION IMAGING    Disposition:   FU 6 months  Signed, [name], MD  05/05/2018 11:57 AM    [place], [place],   [address] Phone: [phone]; Fax: [phone]

## 2018-05-05 ENCOUNTER — Encounter: Payer: Self-pay | Admitting: Cardiovascular Disease

## 2018-05-24 ENCOUNTER — Telehealth (HOSPITAL_COMMUNITY): Payer: Self-pay | Admitting: *Deleted

## 2018-05-24 NOTE — Telephone Encounter (Signed)
Left message on voicemail per DPR in reference to upcoming appointment scheduled on 05/29/18 with detailed instructions given per Myocardial Perfusion Study Information Sheet for the test. LM to arrive 15 minutes early, and that it is imperative to arrive on time for appointment to keep from having the test rescheduled. If you need to cancel or reschedule your appointment, please call the office within 24 hours of your appointment. Failure to do so may result in a cancellation of your appointment, and a $50 no show fee. Phone number given for call back for any questions. Kirstie Peri

## 2018-05-25 LAB — CUP PACEART INCLINIC DEVICE CHECK
Battery Remaining Longevity: 80 mo
Battery Voltage: 3.01 V
Brady Statistic AP VP Percent: 97.43 %
Brady Statistic AP VS Percent: 0.03 %
Brady Statistic AS VP Percent: 2.54 %
Brady Statistic AS VS Percent: 0 %
Brady Statistic RA Percent Paced: 97.29 %
Brady Statistic RV Percent Paced: 99.69 %
Date Time Interrogation Session: 20190424130907
Implantable Lead Implant Date: 20170517
Implantable Lead Implant Date: 20170517
Implantable Lead Location: 753859
Implantable Lead Location: 753860
Implantable Lead Model: 5076
Implantable Lead Model: 5076
Implantable Pulse Generator Implant Date: 20170517
Lead Channel Impedance Value: 399 Ohm
Lead Channel Impedance Value: 475 Ohm
Lead Channel Impedance Value: 513 Ohm
Lead Channel Impedance Value: 627 Ohm
Lead Channel Pacing Threshold Amplitude: 0.75 V
Lead Channel Pacing Threshold Amplitude: 0.75 V
Lead Channel Pacing Threshold Pulse Width: 0.4 ms
Lead Channel Pacing Threshold Pulse Width: 0.4 ms
Lead Channel Sensing Intrinsic Amplitude: 11.875 mV
Lead Channel Sensing Intrinsic Amplitude: 3.25 mV
Lead Channel Setting Pacing Amplitude: 2 V
Lead Channel Setting Pacing Amplitude: 2.5 V
Lead Channel Setting Pacing Pulse Width: 0.4 ms
Lead Channel Setting Sensing Sensitivity: 2.8 mV

## 2018-05-28 ENCOUNTER — Other Ambulatory Visit: Payer: Self-pay | Admitting: Cardiovascular Disease

## 2018-05-29 ENCOUNTER — Other Ambulatory Visit: Payer: Medicare Other | Admitting: *Deleted

## 2018-05-29 ENCOUNTER — Ambulatory Visit (HOSPITAL_COMMUNITY): Payer: Medicare Other | Attending: Cardiology

## 2018-05-29 DIAGNOSIS — R5383 Other fatigue: Secondary | ICD-10-CM | POA: Insufficient documentation

## 2018-05-29 DIAGNOSIS — I10 Essential (primary) hypertension: Secondary | ICD-10-CM | POA: Insufficient documentation

## 2018-05-29 DIAGNOSIS — R931 Abnormal findings on diagnostic imaging of heart and coronary circulation: Secondary | ICD-10-CM | POA: Insufficient documentation

## 2018-05-29 DIAGNOSIS — E785 Hyperlipidemia, unspecified: Secondary | ICD-10-CM | POA: Insufficient documentation

## 2018-05-29 DIAGNOSIS — I2510 Atherosclerotic heart disease of native coronary artery without angina pectoris: Secondary | ICD-10-CM | POA: Insufficient documentation

## 2018-05-29 LAB — CBC WITH DIFFERENTIAL/PLATELET
Basophils Absolute: 0 10*3/uL (ref 0.0–0.2)
Basos: 1 %
EOS (ABSOLUTE): 0.1 10*3/uL (ref 0.0–0.4)
Eos: 3 %
Hematocrit: 40.2 % (ref 37.5–51.0)
Hemoglobin: 14.3 g/dL (ref 13.0–17.7)
Immature Grans (Abs): 0 10*3/uL (ref 0.0–0.1)
Immature Granulocytes: 0 %
Lymphocytes Absolute: 0.9 10*3/uL (ref 0.7–3.1)
Lymphs: 21 %
MCH: 32.1 pg (ref 26.6–33.0)
MCHC: 35.6 g/dL (ref 31.5–35.7)
MCV: 90 fL (ref 79–97)
Monocytes Absolute: 0.5 10*3/uL (ref 0.1–0.9)
Monocytes: 12 %
Neutrophils Absolute: 2.7 10*3/uL (ref 1.4–7.0)
Neutrophils: 63 %
Platelets: 187 10*3/uL (ref 150–450)
RBC: 4.45 x10E6/uL (ref 4.14–5.80)
RDW: 13.1 % (ref 12.3–15.4)
WBC: 4.2 10*3/uL (ref 3.4–10.8)

## 2018-05-29 LAB — MYOCARDIAL PERFUSION IMAGING
LV dias vol: 144 mL (ref 62–150)
LV sys vol: 71 mL
Peak HR: 102 {beats}/min
RATE: 0.28
Rest HR: 67 {beats}/min
SDS: 2
SRS: 10
SSS: 12
TID: 1.04

## 2018-05-29 LAB — COMPREHENSIVE METABOLIC PANEL
ALT: 16 IU/L (ref 0–44)
AST: 20 IU/L (ref 0–40)
Albumin/Globulin Ratio: 1.9 (ref 1.2–2.2)
Albumin: 4.4 g/dL (ref 3.5–4.8)
Alkaline Phosphatase: 54 IU/L (ref 39–117)
BUN/Creatinine Ratio: 15 (ref 10–24)
BUN: 14 mg/dL (ref 8–27)
Bilirubin Total: 0.7 mg/dL (ref 0.0–1.2)
CO2: 25 mmol/L (ref 20–29)
Calcium: 10.2 mg/dL (ref 8.6–10.2)
Chloride: 101 mmol/L (ref 96–106)
Creatinine, Ser: 0.96 mg/dL (ref 0.76–1.27)
GFR calc Af Amer: 89 mL/min/{1.73_m2} (ref >59)
GFR calc non Af Amer: 77 mL/min/{1.73_m2} (ref >59)
Globulin, Total: 2.3 g/dL (ref 1.5–4.5)
Glucose: 91 mg/dL (ref 65–99)
Potassium: 3.7 mmol/L (ref 3.5–5.2)
Sodium: 141 mmol/L (ref 134–144)
Total Protein: 6.7 g/dL (ref 6.0–8.5)

## 2018-05-29 LAB — LIPID PANEL
Chol/HDL Ratio: 2.6 ratio (ref 0.0–5.0)
Cholesterol, Total: 154 mg/dL (ref 100–199)
HDL: 60 mg/dL (ref >39)
LDL Calculated: 84 mg/dL (ref 0–99)
Triglycerides: 52 mg/dL (ref 0–149)
VLDL Cholesterol Cal: 10 mg/dL (ref 5–40)

## 2018-05-29 MED ORDER — TECHNETIUM TC 99M TETROFOSMIN IV KIT
32.5000 | PACK | Freq: Once | INTRAVENOUS | Status: AC | PRN
  Administered 2018-05-29: 32.5 via INTRAVENOUS
  Filled 2018-05-29: qty 33

## 2018-05-29 MED ORDER — REGADENOSON 0.4 MG/5ML IV SOLN
0.4000 mg | Freq: Once | INTRAVENOUS | Status: AC
  Administered 2018-05-29: 0.4 mg via INTRAVENOUS

## 2018-05-29 MED ORDER — TECHNETIUM TC 99M TETROFOSMIN IV KIT
10.3000 | PACK | Freq: Once | INTRAVENOUS | Status: AC | PRN
  Administered 2018-05-29: 10.3 via INTRAVENOUS
  Filled 2018-05-29: qty 11

## 2018-06-01 ENCOUNTER — Encounter: Payer: Self-pay | Admitting: Family Medicine

## 2018-06-01 ENCOUNTER — Ambulatory Visit (INDEPENDENT_AMBULATORY_CARE_PROVIDER_SITE_OTHER): Payer: Medicare Other | Admitting: Family Medicine

## 2018-06-01 VITALS — BP 124/68 | HR 67 | Temp 98.3°F | Ht 70.0 in | Wt 195.0 lb

## 2018-06-01 DIAGNOSIS — T7840XA Allergy, unspecified, initial encounter: Secondary | ICD-10-CM | POA: Insufficient documentation

## 2018-06-01 DIAGNOSIS — S80862A Insect bite (nonvenomous), left lower leg, initial encounter: Secondary | ICD-10-CM

## 2018-06-01 DIAGNOSIS — I2510 Atherosclerotic heart disease of native coronary artery without angina pectoris: Secondary | ICD-10-CM

## 2018-06-01 DIAGNOSIS — M436 Torticollis: Secondary | ICD-10-CM | POA: Diagnosis not present

## 2018-06-01 DIAGNOSIS — W57XXXA Bitten or stung by nonvenomous insect and other nonvenomous arthropods, initial encounter: Secondary | ICD-10-CM

## 2018-06-01 MED ORDER — DOXYCYCLINE HYCLATE 100 MG PO TABS: 100.0000 mg | ORAL_TABLET | Freq: Two times a day (BID) | ORAL | 0 refills | Status: DC

## 2018-06-01 NOTE — Assessment & Plan Note (Signed)
Given time frame appropriate and difficulty in assessing if new symptoms related to tick borne illness... Will proceed with labs and  Treat with doxy empirically.   No red flags.

## 2018-06-01 NOTE — Progress Notes (Signed)
   Subjective:    Patient ID: Derrick [name], male    DOB: 03-18-1941, 77 y.o.   MRN: [id number]  HPI   77 year old male presents after 2 deer tick bites in last month.. One on left lower leg 3 weeks ago, one in left groin 4 weeks ago.   Left leg bite has associated rash... Redness surrounding, itchy.  He reports he has  faitgue but not new.   joint pain butcomes and go. Always has neck stiffness. HAs neck pain, is worse some. Hard to tell  If new given he has OA. No confusion, no new numbness, weakness.  Has issues with balance given neuropathy.  no fever, chills, nausea. No new neuro symptoms   Also in last 2-3 months having areas that are red and itchy on arms and legs... scratches then it goes away... No rash. No new exposures. No mouth swelling, SOB, diff swallowing.  Blood pressure 124/68, pulse 67, temperature 98.3 F (36.8 C), temperature source Oral, height 5\' 10"  (1.778 m), weight 195 lb (88.5 kg), SpO2 97 %.   Review of Systems  Constitutional: Positive for fatigue. Negative for fever.  HENT: Negative for ear pain.   Eyes: Negative for pain.  Respiratory: Negative for cough and shortness of breath.   Cardiovascular: Negative for chest pain, palpitations and leg swelling.  Gastrointestinal: Negative for abdominal pain.  Genitourinary: Negative for dysuria.  Musculoskeletal: Positive for arthralgias, neck pain and neck stiffness.  Neurological: Positive for numbness. Negative for syncope, light-headedness and headaches.  Psychiatric/Behavioral: Negative for dysphoric mood.       Objective:   Physical Exam  Constitutional: Vital signs are normal. He appears well-developed and well-nourished.  HENT:  Head: Normocephalic.  Right Ear: Hearing normal.  Left Ear: Hearing normal.  Nose: Nose normal.  Mouth/Throat: Oropharynx is clear and moist and mucous membranes are normal.  Neck: Trachea normal. Carotid bruit is not present. No thyroid mass and no thyromegaly present.    Cardiovascular: Normal rate, regular rhythm and normal pulses. Exam reveals no gallop, no distant heart sounds and no friction rub.  No murmur heard. No peripheral edema  Pulmonary/Chest: Effort normal and breath sounds normal. No respiratory distress.  Skin: Skin is warm, dry and intact. No rash noted.  Warm slightly pick area on right arm... Pt states itchy, brusing at site where scratched last week   healed tick bite   In left legwith slight assoicated redness, nonblanching around bite...pt does have  1plus.Bilateral edema in both legs chronically.  No rash remains at groin bite site.  Psychiatric: He has a normal mood and affect. His speech is normal and behavior is normal. Thought content normal.          Assessment & Plan:

## 2018-06-01 NOTE — Assessment & Plan Note (Signed)
UNclear if recurrent rash due to exposure... No clear new med or other exposure. Will treat with zyrtec and follow up in 2 weeks.

## 2018-06-01 NOTE — Patient Instructions (Addendum)
Consider trial of zyrtec at bedtime for itching.  Please stop at the lab to have labs drawn.  Start a course of doxycycline x 10 days for empiric coverage of tick borne illness.

## 2018-06-01 NOTE — Addendum Note (Signed)
Addended by: Lendon Collar on: 06/01/2018 09:22 AM   Modules accepted: Orders

## 2018-06-06 LAB — B. BURGDORFI ANTIBODIES BY WB
B burgdorferi IgG Abs (IB): NEGATIVE
B burgdorferi IgM Abs (IB): NEGATIVE
Lyme Disease 18 kD IgG: NONREACTIVE
Lyme Disease 23 kD IgG: NONREACTIVE
Lyme Disease 23 kD IgM: NONREACTIVE
Lyme Disease 28 kD IgG: NONREACTIVE
Lyme Disease 30 kD IgG: NONREACTIVE
Lyme Disease 39 kD IgG: REACTIVE — AB
Lyme Disease 39 kD IgM: NONREACTIVE
Lyme Disease 41 kD IgG: NONREACTIVE
Lyme Disease 41 kD IgM: NONREACTIVE
Lyme Disease 45 kD IgG: NONREACTIVE
Lyme Disease 58 kD IgG: NONREACTIVE
Lyme Disease 66 kD IgG: NONREACTIVE
Lyme Disease 93 kD IgG: NONREACTIVE

## 2018-06-06 LAB — EHRLICHIA ANTIBODY PANEL
E. CHAFFEENSIS AB IGG: <1:64 {titer}
E. CHAFFEENSIS AB IGM: <1:20 {titer}

## 2018-06-06 LAB — ROCKY MTN SPOTTED FVR ABS PNL(IGG+IGM)
RMSF IgG: NOT DETECTED
RMSF IgM: NOT DETECTED

## 2018-06-12 ENCOUNTER — Telehealth: Payer: Self-pay | Admitting: Family Medicine

## 2018-06-12 DIAGNOSIS — E559 Vitamin D deficiency, unspecified: Secondary | ICD-10-CM

## 2018-06-12 DIAGNOSIS — R7303 Prediabetes: Secondary | ICD-10-CM

## 2018-06-12 DIAGNOSIS — I10 Essential (primary) hypertension: Secondary | ICD-10-CM

## 2018-06-12 DIAGNOSIS — E785 Hyperlipidemia, unspecified: Secondary | ICD-10-CM

## 2018-06-12 NOTE — Telephone Encounter (Signed)
-----   Message from Eustace Pen, LPN sent at 04/11/6062  3:15 PM EDT ----- Regarding: Labs 6/18 Lab orders needed. Thank you.  Insurance:  [employer]

## 2018-06-13 ENCOUNTER — Ambulatory Visit (INDEPENDENT_AMBULATORY_CARE_PROVIDER_SITE_OTHER): Payer: Medicare Other

## 2018-06-13 ENCOUNTER — Ambulatory Visit: Payer: Medicare Other

## 2018-06-13 VITALS — BP 124/80 | HR 64 | Temp 97.7°F | Ht 67.5 in | Wt 195.0 lb

## 2018-06-13 DIAGNOSIS — Z0000 Encounter for general adult medical examination without abnormal findings: Secondary | ICD-10-CM | POA: Diagnosis not present

## 2018-06-13 DIAGNOSIS — R7303 Prediabetes: Secondary | ICD-10-CM

## 2018-06-13 DIAGNOSIS — E785 Hyperlipidemia, unspecified: Secondary | ICD-10-CM

## 2018-06-13 DIAGNOSIS — E559 Vitamin D deficiency, unspecified: Secondary | ICD-10-CM | POA: Diagnosis not present

## 2018-06-13 LAB — HEMOGLOBIN A1C: Hgb A1c MFr Bld: 5.1 % (ref 4.6–6.5)

## 2018-06-13 LAB — VITAMIN D 25 HYDROXY (VIT D DEFICIENCY, FRACTURES): VITD: 48.26 ng/mL (ref 30.00–100.00)

## 2018-06-13 NOTE — Patient Instructions (Signed)
Derrick [name] , Thank you for taking time to come for your Medicare Wellness Visit. I appreciate your ongoing commitment to your health goals. Please review the following plan we discussed and let me know if I can assist you in the future.   These are the goals we discussed: Goals    . DIET - INCREASE WATER INTAKE     Starting 06/13/2018, I will continue to drink at least 2 liters of water daily.        This is a list of the screening recommended for you and due dates:  Health Maintenance  Topic Date Due  . Flu Shot  07/27/2018  . Colon Cancer Screening  11/08/2020  . Tetanus Vaccine  01/12/2026  . Pneumonia vaccines  Completed   .Preventive Care for Adults  A healthy lifestyle and preventive care can promote health and wellness. Preventive health guidelines for adults include the following key practices.  . A routine yearly physical is a good way to check with your health care provider about your health and preventive screening. It is a chance to share any concerns and updates on your health and to receive a thorough exam.  . Visit your dentist for a routine exam and preventive care every 6 months. Brush your teeth twice a day and floss once a day. Good oral hygiene prevents tooth decay and gum disease.  . The frequency of eye exams is based on your age, health, family medical history, use  of contact lenses, and other factors. Follow your health care provider's recommendations for frequency of eye exams.  . Eat a healthy diet. Foods like vegetables, fruits, whole grains, low-fat dairy products, and lean protein foods contain the nutrients you need without too many calories. Decrease your intake of foods high in solid fats, added sugars, and salt. Eat the right amount of calories for you. Get information about a proper diet from your health care provider, if necessary.  . Regular physical exercise is one of the most important things you can do for your health. Most adults should get at  least 150 minutes of moderate-intensity exercise (any activity that increases your heart rate and causes you to sweat) each week. In addition, most adults need muscle-strengthening exercises on 2 or more days a week.  Silver Sneakers may be a benefit available to you. To determine eligibility, you may visit the website: www.silversneakers.com or contact program at 212 659 3694 Mon-Fri between 8AM-8PM.   . Maintain a healthy weight. The body mass index (BMI) is a screening tool to identify possible weight problems. It provides an estimate of body fat based on height and weight. Your health care provider can find your BMI and can help you achieve or maintain a healthy weight.   For adults 20 years and older: ? A BMI below 18.5 is considered underweight. ? A BMI of 18.5 to 24.9 is normal. ? A BMI of 25 to 29.9 is considered overweight. ? A BMI of 30 and above is considered obese.   . Maintain normal blood lipids and cholesterol levels by exercising and minimizing your intake of saturated fat. Eat a balanced diet with plenty of fruit and vegetables. Blood tests for lipids and cholesterol should begin at age 11 and be repeated every 5 years. If your lipid or cholesterol levels are high, you are over 50, or you are at high risk for heart disease, you may need your cholesterol levels checked more frequently. Ongoing high lipid and cholesterol levels should be treated with  medicines if diet and exercise are not working.  . If you smoke, find out from your health care provider how to quit. If you do not use tobacco, please do not start.  . If you choose to drink alcohol, please do not consume more than 2 drinks per day. One drink is considered to be 12 ounces (355 mL) of beer, 5 ounces (148 mL) of wine, or 1.5 ounces (44 mL) of liquor.  . If you are 15-53 years old, ask your health care provider if you should take aspirin to prevent strokes.  . Use sunscreen. Apply sunscreen liberally and repeatedly  throughout the day. You should seek shade when your shadow is shorter than you. Protect yourself by wearing long sleeves, pants, a wide-brimmed hat, and sunglasses year round, whenever you are outdoors.  . Once a month, do a whole body skin exam, using a mirror to look at the skin on your back. Tell your health care provider of new moles, moles that have irregular borders, moles that are larger than a pencil eraser, or moles that have changed in shape or color.

## 2018-06-13 NOTE — Progress Notes (Signed)
Subjective:   Derrick [name] is a 77 y.o. male who presents for Medicare Annual/Subsequent preventive examination.  Review of Systems:  N/A Cardiac Risk Factors include: advanced age (>71men, >57 women);male gender     Objective:    Vitals: BP 124/80 (BP Location: Right Arm, Patient Position: Sitting, Cuff Size: Normal)   Pulse 64   Temp 97.7 F (36.5 C) (Oral)   Ht 5' 7.5" (1.715 m) Comment: no shoes  Wt 195 lb (88.5 kg)   SpO2 98%   BMI 30.09 kg/m   Body mass index is 30.09 kg/m.  Advanced Directives 06/13/2018 08/03/2016 05/12/2016 05/12/2016 02/10/2016 12/02/2015 12/02/2015  Does Patient Have a Medical Advance Directive? Yes Yes Yes Yes Yes No No  Type of Paramedic of [name];Living will [place];Living will [place];Living will [place];Living will [place];Living will - -  Does patient want to make changes to medical advance directive? - No - Patient declined No - Patient declined - No - Patient declined - -  Copy of [place] in Chart? Yes No - copy requested No - copy requested No - copy requested Yes - -  Would patient like information on creating a medical advance directive? No - Patient declined - - - - No - patient declined information No - patient declined information    Tobacco Social History   Tobacco Use  Smoking Status Never Smoker  Smokeless Tobacco Never Used     Counseling given: No   Clinical Intake:  Pre-visit preparation completed: Yes  Pain : No/denies pain Pain Score: 0-No pain     Nutritional Status: BMI 25 -29 Overweight Nutritional Risks: None Diabetes: No  How often do you need to have someone help you when you read instructions, pamphlets, or other written materials from your doctor or pharmacy?: 1 - Never What is the last grade level you completed in school?: Masters degree  Interpreter Needed?: No  Comments: pt  lives with spouse Information entered by :: LPinson, LPN  Past Medical History:  Diagnosis Date  . BENIGN PROSTATIC HYPERTROPHY, WITH OBSTRUCTION 05/28/2010  . CAD (coronary artery disease)    a. s/p NSTEMI 06/01/11: DES to RCA;  b. cath 06/25/11:   dLM 50-60% (FFR 0.87), prox to mid LAD 40-50%, D1 50%, pCFX 50%, RCA stent ok, dPDA 80%, EF 55-60%.  His FFR was felt to be negative and therefore medical therapy was recommended ;  echo 6/12: EF 55-60%, mild AS   . Chronic back pain   . CIDP (chronic inflammatory demyelinating polyneuropathy) ([place]) 04/11/2012  . Colon cancer ([place]) dx'd 2000   "left"  . COLONIC POLYPS, ADENOMATOUS, HX OF 05/28/2010  . Complication of anesthesia    "stopped breathing; related to my sleep apnea"   . COPD (chronic obstructive pulmonary disease) ([place])    "associated w/lung sarcoidosis"  . 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 DISH (diffuse idiopathic skeletal hyperostosis) 12/02/2015  . Diverticulitis   . GENERALIZED OSTEOARTHROSIS UNSPECIFIED SITE 05/28/2010  . GERD 05/28/2010  . Heart murmur   . History of gout   . HYPERLIPIDEMIA 05/28/2010  . Hypertension   . IgM lambda paraproteinemia   . 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 Malignant neoplasm of descending colon ([place]) 05/28/2010  . MITRAL VALVE PROLAPSE 05/28/2010  . 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 NSTEMI (non-ST elevated myocardial infarction) ([place]) 06/03/11  . Open-angle glaucoma of  both eyes 12/2012  . OSA on CPAP 05/28/2010  . PALPITATIONS, CHRONIC 05/28/2010  . Parotid gland pain 2010   infection  . Peripheral neuropathy    "tx'd w/targeted chemo" (05/12/2016)  . Pneumonia 2-3 times  . Presence of permanent cardiac pacemaker   . 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 Sarcoidosis    pulmonalis  . UNSPECIFIED INFLAMMATORY AND TOXIC NEUROPATHY 05/28/2010  . Vision loss   . VITAMIN D DEFICIENCY 05/28/2010   Past Surgical History:  Procedure  Laterality Date  . APPENDECTOMY  1954  . CATARACT EXTRACTION W/ INTRAOCULAR LENS IMPLANT Left   . COLON SURGERY  2000   decending colon   . CORONARY ANGIOPLASTY WITH STENT PLACEMENT  06/03/11  . ELECTROPHYSIOLOGIC STUDY N/A 05/12/2016   Procedure: Cardioversion;  Surgeon: [name], MD;  Location: [facility];  Service: Cardiovascular;  Laterality: N/A;  . EP IMPLANTABLE DEVICE N/A 05/12/2016   Procedure: Pacemaker Implant;  Surgeon: [name], MD;  Location: [facility];  Service: Cardiovascular;  Laterality: N/A;  . INSERT / REPLACE / REMOVE PACEMAKER  05/12/2016  . KNEE ARTHROSCOPY Right 2003  . LEFT HEART CATHETERIZATION WITH CORONARY ANGIOGRAM N/A 03/31/2015   Procedure: LEFT HEART CATHETERIZATION WITH CORONARY ANGIOGRAM;  Surgeon: [name], MD;  Location: [facility];  Service: Cardiovascular;  Laterality: N/A;  . LUNG SURGERY  2001   "open lung dissection"  . MOHS SURGERY Left ~ 2008   ear  . PROSTATE BIOPSY  2010  . TONSILLECTOMY  1950   Family History  Problem Relation Age of Onset  . Arthritis Mother        severe  . Coronary artery disease Mother   . Colon polyps Mother   . Heart disease Mother   . Coronary artery disease Brother   . Cancer Brother        choriocarcinoma  . Arrhythmia Brother 76       Afib/Tachycardia  . Heart attack Brother    Social History   Socioeconomic History  . Marital status: Married    Spouse name: Not on file  . Number of children: 2  . Years of education: MS-Coll.  . Highest education level: Not on file  Occupational History  . Occupation: Optometrist    Comment: Building services engineer  Social Needs  . Financial resource strain: Not on file  . Food insecurity:    Worry: Not on file    Inability: Not on file  . Transportation needs:    Medical: Not on file    Non-medical: Not on file  Tobacco Use  . Smoking status: Never Smoker  . Smokeless tobacco: Never Used  Substance and Sexual  Activity  . Alcohol use: Yes    Alcohol/week: 8.4 oz    Types: 14 Glasses of wine per week    Comment: 05/12/2016 "10oz wine qd"  . Drug use: No  . Sexual activity: Yes    Birth control/protection: None  Lifestyle  . Physical activity:    Days per week: Not on file    Minutes per session: Not on file  . Stress: Not on file  Relationships  . Social connections:    Talks on phone: Not on file    Gets together: Not on file    Attends religious service: Not on file    Active member of club or organization: Not on file    Attends meetings of clubs or organizations: Not on file    Relationship status:  Not on file  Other Topics Concern  . Not on file  Social History Narrative   College- [employer]; [employer]-environment science and mgt. Married [name])  "31" 1 son- 86; 1 dtr "80" 2 grandchildren. 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 End of life-provided discussive context and provided packet.    Outpatient Encounter Medications as of 06/13/2018  Medication Sig  . albuterol (PROVENTIL HFA;VENTOLIN HFA) 108 (90 BASE) MCG/ACT inhaler Inhale 2 puffs into the lungs every 6 (six) hours as needed for wheezing or shortness of breath.  [name] amLODipine (NORVASC) 10 MG tablet TAKE 1 TABLET BY MOUTH ONCE DAILY  . atorvastatin (LIPITOR) 20 MG tablet Take 1 tablet (20 mg total) by mouth daily. Please keep upcoming appt. Thank you  . bimatoprost (LUMIGAN) 0.01 % SOLN Place 1 drop into both eyes at bedtime.  . budesonide (PULMICORT) 180 MCG/ACT inhaler Inhale 2 puffs 2 (two) times daily as needed into the lungs (wheezing or shortness of breath).  . CHOLECALCIFEROL PO Take 1,000 mg by mouth daily. Vitamin D 3  . dorzolamide-timolol (COSOPT) 22.3-6.8 MG/ML ophthalmic solution Place 1 drop into both eyes 2 (two) times daily.   [name] ELIQUIS 5 MG TABS tablet TAKE 1 TABLET BY MOUTH TWICE DAILY  . finasteride (PROSCAR) 5 MG tablet Take 5 mg by mouth daily.   . fluticasone (FLONASE) 50 MCG/ACT nasal spray  Place 2 sprays into both nostrils 2 (two) times daily.  [name] gabapentin (NEURONTIN) 800 MG tablet Take 0.5-1 tablets (400-800 mg total) by mouth 3 (three) times daily.  . MULTAQ 400 MG tablet TAKE 1 TABLET BY MOUTH TWICE DAILY WITH MEALS  . pantoprazole (PROTONIX) 40 MG tablet Take 1 tablet (40 mg total) by mouth daily.  . tadalafil (CIALIS) 5 MG tablet Take 5 mg by mouth daily. Reported on 05/26/2016  . triamterene-hydrochlorothiazide (MAXZIDE-25) 37.5-25 MG tablet TAKE 1 TABLET BY MOUTH ONCE DAILY  . [DISCONTINUED] doxycycline (VIBRA-TABS) 100 MG tablet Take 1 tablet (100 mg total) by mouth 2 (two) times daily.   Facility-Administered Encounter Medications as of 06/13/2018  Medication  . 0.9 %  sodium chloride infusion    Activities of Daily Living In your present state of health, do you have any difficulty performing the following activities: 06/13/2018  Hearing? N  Vision? N  Difficulty concentrating or making decisions? Y  Walking or climbing stairs? N  Dressing or bathing? N  Doing errands, shopping? N  Preparing Food and eating ? N  Using the Toilet? N  In the past six months, have you accidently leaked urine? Y  Do you have problems with loss of bowel control? N  Managing your Medications? N  Managing your Finances? N  Housekeeping or managing your Housekeeping? N  Some recent data might be hidden    Patient Care Team: [name], MD as PCP - General (Family Medicine) [name], MD as PCP - Cardiology (Cardiology) Heath Lark, MD as Consulting Physician (Hematology and Oncology)   Assessment:   This is a routine wellness examination for [place].   Hearing Screening   125Hz  250Hz  500Hz  1000Hz  2000Hz  3000Hz  4000Hz  6000Hz  8000Hz   Right ear:   40 40 40  40    Left ear:   40 40 40  0    Comments: Hearing loss in left ear  Vision Screening Comments: January 2019 with Dr. Satira Sark; scar tissue in left eye  Exercise Activities and Dietary recommendations Current  Exercise Habits: Home exercise routine, Type of exercise: walking, Time (Minutes):  30, Frequency (Times/Week): 7, Weekly Exercise (Minutes/Week): 210, Intensity: Mild, Exercise limited by: None identified  Goals    . DIET - INCREASE WATER INTAKE     Starting 06/13/2018, I will continue to drink at least 2 liters of water daily.        Fall Risk Fall Risk  06/13/2018 03/01/2018 03/29/2017 02/10/2016 02/10/2015  Falls in the past year? Yes No Yes No No  Number falls in past yr: 2 or more - 2 or more - -  Injury with Fall? No - No - -  Risk for fall due to : Impaired balance/gait - - - -   Depression Screen PHQ 2/9 Scores 06/13/2018 03/29/2017 02/10/2016 02/10/2015  PHQ - 2 Score 0 0 0 0  PHQ- 9 Score 4 - - -    Cognitive Function MMSE - Mini Mental State Exam 06/13/2018  Orientation to time 5  Orientation to Place 5  Registration 3  Attention/ Calculation 0  Recall 1  Recall-comments unable to recall 2 of 3 words  Language- name 2 objects 0  Language- repeat 1  Language- follow 3 step command 3  Language- read & follow direction 0  Write a sentence 0  Copy design 0  Total score 18   Montreal Cognitive Assessment  01/26/2018  Visuospatial/ Executive (0/5) 4  Naming (0/3) 3  Attention: Read list of digits (0/2) 1  Attention: Read list of letters (0/1) 1  Attention: Serial 7 subtraction starting at 100 (0/3) 3  Language: Repeat phrase (0/2) 1  Language : Fluency (0/1) 0  Abstraction (0/2) 2  Delayed Recall (0/5) 3  Orientation (0/6) 6  Total 24     PLEASE NOTE: A Mini-Cog screen was completed. Maximum score is 20. A value of 0 denotes this part of [place] MMSE was not completed or the patient failed this part of the Mini-Cog screening.   Mini-Cog Screening Orientation to Time - Max 5 pts Orientation to Place - Max 5 pts Registration - Max 3 pts Recall - Max 3 pts Language Repeat - Max 1 pts Language Follow 3 Step Command - Max 3 pts   Immunization History  Administered  Date(s) Administered  . Influenza Split 09/26/2012  . Influenza, High Dose Seasonal PF 09/02/2015, 08/24/2016  . Influenza,inj,Quad PF,6+ Mos 09/25/2013, 09/12/2014  . Pneumococcal Conjugate-13 12/13/2013  . Pneumococcal Polysaccharide-23 02/09/2016  . Tdap 01/13/2016    Screening Tests Health Maintenance  Topic Date Due  . INFLUENZA VACCINE  07/27/2018  . COLONOSCOPY  11/08/2020  . TETANUS/TDAP  01/12/2026  . PNA vac Low Risk Adult  Completed      Plan:     I have personally reviewed, addressed, and noted the following in the patient's chart:  A. Medical and social history B. Use of alcohol, tobacco or illicit drugs  C. Current medications and supplements D. Functional ability and status E.  Nutritional status F.  Physical activity G. Advance directives H. List of other physicians I.  Hospitalizations, surgeries, and ER visits in previous 12 months J.  [place] to include hearing, vision, cognitive, depression L. Referrals and appointments - none  In addition, I have reviewed and discussed with patient certain preventive protocols, quality metrics, and best practice recommendations. A written personalized care plan for preventive services as well as general preventive health recommendations were provided to patient.  See attached scanned questionnaire for additional information.   Signed,   Lindell Noe, MHA, BS, LPN Health Coach

## 2018-06-13 NOTE — Progress Notes (Signed)
PCP notes:   Health maintenance:  No gaps identified.   Abnormal screenings:   Hearing - failed  Hearing Screening   125Hz  250Hz  500Hz  1000Hz  2000Hz  3000Hz  4000Hz  6000Hz  8000Hz   Right ear:   40 40 40  40    Left ear:   40 40 40  0     Fall risk - hx of multiple falls Fall Risk  06/13/2018 03/01/2018 03/29/2017 02/10/2016 02/10/2015  Falls in the past year? Yes No Yes No No  Number falls in past yr: 2 or more - 2 or more - -  Injury with Fall? No - No - -  Risk for fall due to : Impaired balance/gait - - - -   Mini-Cog score: 18/20 MMSE - Mini Mental State Exam 06/13/2018  Orientation to time 5  Orientation to Place 5  Registration 3  Attention/ Calculation 0  Recall 1  Recall-comments unable to recall 2 of 3 words  Language- name 2 objects 0  Language- repeat 1  Language- follow 3 step command 3  Language- read & follow direction 0  Write a sentence 0  Copy design 0  Total score 18     Patient concerns:   None  Nurse concerns:  None  Next PCP appt:    07/04/18 @ 1115  I reviewed health advisor's note, was available for consultation, and agree with documentation and plan. Loura Pardon MD

## 2018-06-16 ENCOUNTER — Encounter: Payer: Medicare Other | Admitting: Family Medicine

## 2018-06-26 ENCOUNTER — Ambulatory Visit (INDEPENDENT_AMBULATORY_CARE_PROVIDER_SITE_OTHER): Payer: Medicare Other | Admitting: *Deleted

## 2018-06-26 DIAGNOSIS — I442 Atrioventricular block, complete: Secondary | ICD-10-CM

## 2018-06-26 DIAGNOSIS — I4891 Unspecified atrial fibrillation: Secondary | ICD-10-CM

## 2018-06-26 NOTE — Progress Notes (Signed)
Remote pacemaker transmission.   

## 2018-06-30 LAB — CUP PACEART REMOTE DEVICE CHECK
Battery Remaining Longevity: 78 mo
Battery Voltage: 3.01 V
Brady Statistic AP VP Percent: 97.94 %
Brady Statistic AP VS Percent: 0.03 %
Brady Statistic AS VP Percent: 2.03 %
Brady Statistic AS VS Percent: 0 %
Brady Statistic RA Percent Paced: 97.9 %
Brady Statistic RV Percent Paced: 99.87 %
Date Time Interrogation Session: 20190701131044
Implantable Lead Implant Date: 20170517
Implantable Lead Implant Date: 20170517
Implantable Lead Location: 753859
Implantable Lead Location: 753860
Implantable Lead Model: 5076
Implantable Lead Model: 5076
Implantable Pulse Generator Implant Date: 20170517
Lead Channel Impedance Value: 399 Ohm
Lead Channel Impedance Value: 456 Ohm
Lead Channel Impedance Value: 494 Ohm
Lead Channel Impedance Value: 627 Ohm
Lead Channel Pacing Threshold Amplitude: 0.75 V
Lead Channel Pacing Threshold Amplitude: 0.75 V
Lead Channel Pacing Threshold Pulse Width: 0.4 ms
Lead Channel Pacing Threshold Pulse Width: 0.4 ms
Lead Channel Sensing Intrinsic Amplitude: 16 mV
Lead Channel Sensing Intrinsic Amplitude: 16 mV
Lead Channel Sensing Intrinsic Amplitude: 2.5 mV
Lead Channel Sensing Intrinsic Amplitude: 2.5 mV
Lead Channel Setting Pacing Amplitude: 2 V
Lead Channel Setting Pacing Amplitude: 2.5 V
Lead Channel Setting Pacing Pulse Width: 0.4 ms
Lead Channel Setting Sensing Sensitivity: 2.8 mV

## 2018-07-04 ENCOUNTER — Encounter: Payer: Self-pay | Admitting: Family Medicine

## 2018-07-04 ENCOUNTER — Ambulatory Visit (INDEPENDENT_AMBULATORY_CARE_PROVIDER_SITE_OTHER): Payer: Medicare Other | Admitting: Family Medicine

## 2018-07-04 DIAGNOSIS — E785 Hyperlipidemia, unspecified: Secondary | ICD-10-CM

## 2018-07-04 DIAGNOSIS — I480 Paroxysmal atrial fibrillation: Secondary | ICD-10-CM | POA: Diagnosis not present

## 2018-07-04 DIAGNOSIS — I10 Essential (primary) hypertension: Secondary | ICD-10-CM | POA: Diagnosis not present

## 2018-07-04 DIAGNOSIS — D472 Monoclonal gammopathy: Secondary | ICD-10-CM

## 2018-07-04 DIAGNOSIS — M4810 Ankylosing hyperostosis [Forestier], site unspecified: Secondary | ICD-10-CM

## 2018-07-04 DIAGNOSIS — G63 Polyneuropathy in diseases classified elsewhere: Secondary | ICD-10-CM | POA: Diagnosis not present

## 2018-07-04 DIAGNOSIS — G6181 Chronic inflammatory demyelinating polyneuritis: Secondary | ICD-10-CM | POA: Diagnosis not present

## 2018-07-04 DIAGNOSIS — K2100 Gastro-esophageal reflux disease with esophagitis, without bleeding: Secondary | ICD-10-CM

## 2018-07-04 DIAGNOSIS — I2510 Atherosclerotic heart disease of native coronary artery without angina pectoris: Secondary | ICD-10-CM | POA: Diagnosis not present

## 2018-07-04 DIAGNOSIS — G3184 Mild cognitive impairment, so stated: Secondary | ICD-10-CM | POA: Diagnosis not present

## 2018-07-04 DIAGNOSIS — R7303 Prediabetes: Secondary | ICD-10-CM

## 2018-07-04 NOTE — Progress Notes (Signed)
Subjective:    Patient ID: Derrick [name], male    DOB: February 21, 1941, 77 y.o.   MRN: [id number]  HPI   The patient presents for complete physical and review of chronic health problems.  The patient saw Candis Musa, LPN for medicare wellness. Note reviewed in detail and important notes copied below. PCP notes:   Health maintenance:  No gaps identified.   Abnormal screenings:   Hearing - failed.. Has noted some hearing issues.. More left than right.             Hearing Screening   125Hz  250Hz  500Hz  1000Hz  2000Hz  3000Hz  4000Hz  6000Hz  8000Hz   Right ear:   40 40 40  40    Left ear:   40 40 40  0     Fall risk - hx of multiple falls Fall Risk  06/13/2018 03/01/2018 03/29/2017 02/10/2016 02/10/2015  Falls in the past year? Yes No Yes No No  Number falls in past yr: 2 or more - 2 or more - -  Injury with Fall? No - No - -  Risk for fall due to : Impaired balance/gait - - - -   Mini-Cog score: 18/20 MMSE - Mini Mental State Exam 06/13/2018  Orientation to time 5  Orientation to Place 5  Registration 3  Attention/ Calculation 0  Recall 1  Recall-comments unable to recall 2 of 3 words  Language- name 2 objects 0  Language- repeat 1  Language- follow 3 step command 3  Language- read & follow direction 0  Write a sentence 0  Copy design 0  Total score 18     07/04/18  TODAY:  MGUS, IgM Dx 2008:  Treated with ritoxin for 2 years in [place]. Now followed Dr. Alvy Bimler q6 months.  Has had neuropathy for many years.. Progressive worsening now in last several months moderate to severe. Possibly from MGUS.  Gabapentin controls his symptoms somewhat.. Burning in leg and feet. He is trying to come off this medication given he feels it is making memory worse, fatigue. His memory is somewhat better on the lower dose of gabapentin. MMSE at [facility] slightly decreased.. Follow over time.  DISH: diffuse.. Pain in right knee, hands, back, ankles  Followed by Rheum (Dr.  Marijean Bravo). Is considering PT  Repeat.  Exercicing daily.  Dr. Burt Knack cardiology follow pt for  CAD,He has a history of myocardial infarction and drug-eluting stent placement in the right coronary artery. 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 He's been managed medically. Atrial Fibrillation: dx in 2017. He underwent permanent pacemaker placement in May 2017. He is anticoagulated with Eliquis. He is treated with a rhythm control strategy using Multaq. His CHADS-Vasc score is 3. Last OV reviewed  No current anginal symptoms.  GERD well controlled on protonix  Hypertension:   Well controlled on current regimen: amlodipine, maxide BP Readings from Last 3 Encounters:  07/04/18 111/61  06/13/18 124/80  06/01/18 124/68   No current CP, no SOB. Some swelling  In ankles if on feet.  High cholesterol:  Good control last check on lipitor but LDL not < 70.Faythe Ghee per Cards Dr. York Cerise noted. Lab Results  Component Value Date   CHOL 154 05/29/2018   HDL 60 05/29/2018   LDLCALC 84 05/29/2018   TRIG 52 05/29/2018   CHOLHDL 2.6 05/29/2018    Hyperglycemia/prediabetes Lab Results  Component Value Date   HGBA1C 5.1 06/13/2018    Pulmonary sarcoid: noted in lung  during colon cancer dx. Minimal symptoms, no longer on pulmicort. Rarely using albuterol. Only issue is when he gets lung infection.. Needs inhaler.  OSA on CPAP.   Hx of colon cancer 2000  Social History /Family History/Past Medical History reviewed in detail and updated in EMR if needed. Blood pressure 111/61, pulse 63, temperature 98.2 F (36.8 C), temperature source Oral, height 5' 7.5" (1.715 m), weight 196 lb 4 oz (89 kg), SpO2 97 %.   Wt Readings from Last 3 Encounters:  07/04/18 196 lb 4 oz (89 kg)  06/13/18 195 lb (88.5 kg)  06/01/18 195 lb (88.5 kg)   Body mass index is 30.28 kg/m.  Review of Systems  Constitutional: Positive for fatigue.  Negative for fever.  HENT: Negative for ear pain.   Eyes: Negative for pain.  Respiratory: Negative for cough and shortness of breath.   Cardiovascular: Negative for chest pain, palpitations and leg swelling.  Gastrointestinal: Negative for abdominal pain.  Genitourinary: Negative for dysuria.  Musculoskeletal: Negative for arthralgias.  Neurological: Positive for numbness. Negative for syncope, light-headedness and headaches.        Chronic neuropathy progressively worsening.  Psychiatric/Behavioral: Negative for dysphoric mood.       Objective:   Physical Exam  Constitutional: He appears well-developed and well-nourished.  Non-toxic appearance. He does not appear ill. No distress.  HENT:  Head: Normocephalic and atraumatic.  Right Ear: Hearing, tympanic membrane, external ear and ear canal normal.  Left Ear: Hearing, tympanic membrane, external ear and ear canal normal.  Nose: Nose normal.  Mouth/Throat: Uvula is midline, oropharynx is clear and moist and mucous membranes are normal.  Eyes: Pupils are equal, round, and reactive to light. Conjunctivae, EOM and lids are normal. Lids are everted and swept, no foreign bodies found.  Neck: Trachea normal, normal range of motion and phonation normal. Neck supple. Carotid bruit is not present. No thyroid mass and no thyromegaly present.  Cardiovascular: Normal rate, S1 normal, S2 normal, intact distal pulses and normal pulses. An irregularly irregular rhythm present. Exam reveals no gallop.  No murmur heard. Pulmonary/Chest: Breath sounds normal. He has no wheezes. He has no rhonchi. He has no rales.  Abdominal: Soft. Normal appearance and bowel sounds are normal. There is no hepatosplenomegaly. There is no tenderness. There is no rebound, no guarding and no CVA tenderness. No hernia.  Lymphadenopathy:    He has no cervical adenopathy.  Neurological: He is alert. He has normal strength and normal reflexes. A sensory deficit is present. No  cranial nerve deficit. Coordination and gait normal.  Skin: Skin is warm, dry and intact. No rash noted.  Psychiatric: He has a normal mood and affect. His speech is normal and behavior is normal. Judgment normal.          Assessment & Plan:  The patient's preventative maintenance and recommended screening tests for an annual wellness exam were reviewed in full today. Brought up to date unless services declined.  Counselled on the importance of diet, exercise, and its role in overall health and mortality. The patient's FH and SH was reviewed, including their home life, tobacco status, and drug and alcohol status.    Vaccines: TDap, PNA uptodate Prostate Cancer Screen: BPH, hx of biopsy. PSA Followed by Dr. Alinda Money Colon Cancer Screen:  Hx of colon cancer 10/2017 colonoscopy on 3 year recall      Smoking Status: nonsmoker ETOH/ drug use:  minimal/ none

## 2018-07-04 NOTE — Patient Instructions (Signed)
Continue healthy eating and regular exercise as tolerated.

## 2018-07-26 ENCOUNTER — Encounter: Payer: Self-pay | Admitting: Nurse Practitioner

## 2018-07-26 ENCOUNTER — Ambulatory Visit (INDEPENDENT_AMBULATORY_CARE_PROVIDER_SITE_OTHER): Payer: Medicare Other | Admitting: Nurse Practitioner

## 2018-07-26 VITALS — BP 114/62 | HR 60 | Ht 67.5 in | Wt 195.8 lb

## 2018-07-26 DIAGNOSIS — I2510 Atherosclerotic heart disease of native coronary artery without angina pectoris: Secondary | ICD-10-CM | POA: Diagnosis not present

## 2018-07-26 DIAGNOSIS — G4733 Obstructive sleep apnea (adult) (pediatric): Secondary | ICD-10-CM

## 2018-07-26 DIAGNOSIS — G3184 Mild cognitive impairment, so stated: Secondary | ICD-10-CM

## 2018-07-26 DIAGNOSIS — Z9989 Dependence on other enabling machines and devices: Secondary | ICD-10-CM

## 2018-07-26 NOTE — Progress Notes (Signed)
[facility]  PATIENT: Derrick [name] DOB: [birth year]   REASON FOR VISIT: Obstructive sleep apnea here for CPAP compliance and memory testing HISTORY FROM: Patient    HISTORY OF PRESENT ILLNESS:UPDATE 7/31/2019CM Mr. , 77 year old male returns for follow-up for CPAP compliance and memory testing.  He is not having any problems with his CPAP machine.  He complains of occasional short-term memory.  He denies any difficulty driving getting lost unable to do his finances etc.  Compliance  data dated 06/25/2018 07/24/2018 shows compliance greater than 4 hours at 100% average use 9 hours 17 minutes .  Pressure set 5 to 15 cm.  EPR level 3 leak 95th percentile at 15 AHI 1.9 he returns for reevaluation   UPDATE 3/6/2019CM Derrick [name], 77 year old male returns for follow-up with history of obstructive sleep apnea on CPAP with a new machine.  He is not having any problems except that he complains that the machine is too loud.  Compliance data dated 01/29/2018-02/27/2018 shows compliance greater than 4 hours at 30 days for 100%.  Average usage 9 hours 35 minutes.  Pressure settings 5-12 cm .  EPR level 3 AHI 1.4.  He is using nasal pillows ESS 2.  He returns for reevaluation   Derrick [name] is a 77 y.o. male , caucasian of french, [place], and native- [place] decent . Is seen here as a revisit  from Dr. [name]. His PCP is Dr. [name], his ophthalmologist is Dr. [name]. 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  He had been for at least 12 years CPAP treatment at a pressure of 16 cm water,  he was also using a full face mask at the time , which was comfortable.  His original diagnostic sleep study took place in 2000. The patient endorsed  Epworth sleepiness score  at the of 24 points in the FSS at an elevated rate of 42 points as well. His BMI at the time was 30. Meanwhile he had lost 65 pounds.   Derrick [name] underwent a sleep study on 11-30-2011. His AHI at time of study was 42.1 and his RDI 45.5 his REM AHI was 30 and non-REM 42.9 he did not have any supine sleep. He had oxygen desaturations to a nadir of 82% and had a total desaturation time of 52.6 minutes with a the first 3 hours of sleep.The results were indicative of severe sleep apnea and  the patient was titrated to CPAP on 12-05-2011 at the pressure of only 8 cm. He had REM rebound at this setting ,the residual AHI in the night was 0.3 per hour. He also recommended the use of a nasal pillow of possible. The CPAP was download it in 2013 after 90 days of CPAP he was with a new pressure. He had 100% compliance rate and residual AHI of 3.4 with only moderate to mild air leak that pressure was 8 cm water. There are peak time per day was 8 hours and 48 minutes. CPAP still set at 8 cm water with  3 cm EPR.  The air leak has slightly increased the residual AHI is 2.2,  even better than last year. The  therapeutic time is 8 hour and 53 minutes.  The patient has 100% compliance. Today he endorsed Epworth sleepiness score at 0  point and the fatigue severity score at only 15 points.  Geriatric depression score was endorsed at he only scored 1 point not suggestive of clinical depression.  Interval history ;Derrick [name] has also a monoclonal gammopathy and has been treated with rituximab the medication had continuously improved his exercise tolerance, finally he was able to walk 30-40 minutes a day. He has been exercising daily.  He did develop not neuropathic pain,  but some and knee pain on the right and an He has comMRI was now showing DDD on the correspondin spinal level.  Completed his rituximab treatment with Dr. [name]. in February 2014 .He underwent a total of 3 steroid injections at the L5-S1 distribution which  had meanwhile shown a benefit. He has been on gabapentin for neuropathic pain, seems to be related to his MGUS diagnosis .He had developed a severe cold intolerance progressively actually the last year. He has noticed since March 2014  and since his Retuximab treatment was completed, that he has some cold cramping as well and toe cramps,  pain and numbness.  Last Time I ordered NCS and EMG.   Dr Jannifer Franklin did a EMG and NCS, and found axonal damage , not a finding of CIDP.  This was after 3 years of MGUS treatment and I answered his questions, "did I ever have CIDP'? I believe ,yes.  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  The patient has hp pain but is ambulatory, he periodically loses control of his foot lifting muscles - stretching exercises help. As to his obstructive sleep apnea, there is no need for any adjustment at this time. His current settings are comfortable and has residual AHI is very low. In office download confirmed high compliance 02-13-14 . He is extremely intolerant to cold temperature, and has anhydrosis - this is more often seen in dysautonomia with NP.  He was identified as axonal neuropathy by Dr. Jannifer Franklin. I would like to follow the patient once a year for the CPAP exclusively for sleep - RV in 12 month . The CPAP  compliance report for 10-16-14 reveals a AHI of  1.2 and daily user time of  9 hours and 14 minutes, and 100% over 4 hour compliance over 4 hours of nightly use.  He has spinal stenosis - 4 times daily 400 mg neurontin.  A revisit will be scheduled in 12 months. Labs , CBC and Diff by oncologist. Cold agglutinin ordered. Plasma -Electrophoresis reordered.   03-26-15  Interval history Derrick [name] reports that he was recently babysitting the grandchildren in [place] and has noticed that he has less stamina less strength especially in his proximal  leg muscles, and he was just yesterday told that he had an abnormal cardiac stress test. He has known coronary artery disease and a stent in the right main coronary artery he was now diagnosed with coronary artery disease in the left main. Catheterization is planned for Monday. As to his obstructive sleep apnea he is doing very well his compliance report from 03-26-15 shows 97% compliance for days of use and for 4 hours of use the average therapy time is 8 hours and 39 minutes at 9 cm water pressure with 2 cm EPR, residual AHI is 1.7. Fatigue severity score is 23 and Epworth sleepiness score is 1. the patient recently has had some desire to take some daytime naps which she has  not had for a long time or ever. Back from [place] , he noted a black toe nail, without history of trauma. Was this related to blood clot? Its only the nail , not the whole toe. It is tender to touch. 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   Interval history from 10/18/2017, I have the pleasure of seeing Mr. 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 He has followed up with sinus practitioner Cecille Rubin.The patient has been a compliant CPAP use rand today's download confirms this with 100% compliance rate up until October 21, 9 hours 8 minutes of average nocturnal use, said pressure is 9 cm water with 2 cm EPR residual AHI is 4.8 partially due to air leaks. There are some residual obstructive apneas noted. There is no treatment emergent central apnea noted. Mr. Cassedy needs a new CPAP machine which I would like to be an auto titrate capable CPAP.  Further developments : Ventricular tachycardia. Aortic stenosis. CAD. Cardiologist is Dr Burt Knack, electrophysiologist Dr. Garlan Fillers. MGUS- Neuropathy with falls and stumbling, gabapentin controls pain.  His Epworth score is endorsed at one point his fatigue severity at 29 points and his depression score at 1 out of 15 points.    Interval history from 26 January 2018, before he had a good Christmas time with his family, visiting grandchildren, and is here today for a routine visit dedicated to CPAP compliance and memory concerns.  The patient is a highly compliant patient using the machine average 8 hours and 7 minutes, and used the machine for the last 25 out of 30 days, has another CPAP at his second home which she used on the remaining 5 days.  Residual AHI is 1.7, there are mild to moderate leaks, the AutoSet is allowing a pressure between 5 and 12 cmH2O with 3 cm EPR;  there is no evidence of central apnea emerging.  This is a very good report. Part 2 of today's meeting is dedicated to his concerns about cognitive function; the patient participated in a Montreal cognitive assessment, reaching 24 out of 30 points.  The patient mentioned that he took gabapentin just about an hour ago and that he feels quite sedated cognitively slowed by this medication.  Most importantly was fully oriented, serial 7 did very well, could not name enough F words ,but 3 out of 5 recall words. He reports trouble with names and nouns.    REVIEW OF SYSTEMS: Full 14 system review of systems performed and notable only for those listed, all others are neg:  Constitutional: neg  Cardiovascular: neg Ear/Nose/Throat: Hard of hearing Skin: neg Eyes: neg Respiratory: neg Gastroitestinal: neg  Hematology/Lymphatic: n easy bruising Endocrine: Intolerance to cold Musculoskeletal: Walking difficulty due to neuropathy Allergy/Immunology: Environmental allergies Neurological: neg Psychiatric: neg Sleep : Obstructive sleep Apnea with CPAP   ALLERGIES: No Known Allergies  HOME MEDICATIONS: Outpatient Medications Prior to Visit  Medication Sig Dispense Refill  . albuterol (PROVENTIL HFA;VENTOLIN HFA) 108 (90 BASE) MCG/ACT inhaler Inhale 2 puffs into the lungs every 6 (six) hours as needed for wheezing or shortness of breath.    [name] amLODipine  (NORVASC) 10 MG tablet TAKE 1 TABLET BY MOUTH ONCE DAILY 90 tablet 3  . atorvastatin (LIPITOR) 20 MG tablet Take 1 tablet (20 mg total) by mouth daily. Please keep upcoming appt. Thank you 90 tablet 2  . bimatoprost (LUMIGAN) 0.01 % SOLN Place 1 drop into both eyes at bedtime.    [name]  budesonide (PULMICORT) 180 MCG/ACT inhaler Inhale 2 puffs 2 (two) times daily as needed into the lungs (wheezing or shortness of breath).    . CHOLECALCIFEROL PO Take 1,000 mg by mouth daily. Vitamin D 3    . dorzolamide-timolol (COSOPT) 22.3-6.8 MG/ML ophthalmic solution Place 1 drop into both eyes 2 (two) times daily.     [name] ELIQUIS 5 MG TABS tablet TAKE 1 TABLET BY MOUTH TWICE DAILY 180 tablet 2  . finasteride (PROSCAR) 5 MG tablet Take 5 mg by mouth daily.     . fluticasone (FLONASE) 50 MCG/ACT nasal spray Place 2 sprays into both nostrils 2 (two) times daily.    [name] gabapentin (NEURONTIN) 800 MG tablet Take 0.5-1 tablets (400-800 mg total) by mouth 3 (three) times daily. 270 tablet 2  . MULTAQ 400 MG tablet TAKE 1 TABLET BY MOUTH TWICE DAILY WITH MEALS 180 tablet 3  . pantoprazole (PROTONIX) 40 MG tablet Take 1 tablet (40 mg total) by mouth daily. 90 tablet 2  . tadalafil (CIALIS) 5 MG tablet Take 5 mg by mouth daily. Reported on 05/26/2016    . triamterene-hydrochlorothiazide (MAXZIDE-25) 37.5-25 MG tablet TAKE 1 TABLET BY MOUTH ONCE DAILY 90 tablet 3   Facility-Administered Medications Prior to Visit  Medication Dose Route Frequency Provider Last Rate Last Dose  . 0.9 %  sodium chloride infusion  500 mL Intravenous Continuous Pyrtle, Lajuan Lines, MD        PAST MEDICAL HISTORY: Past Medical History:  Diagnosis Date  . BENIGN PROSTATIC HYPERTROPHY, WITH OBSTRUCTION 05/28/2010  . CAD (coronary artery disease)    a. s/p NSTEMI 06/01/11: DES to RCA;  b. cath 06/25/11:   dLM 50-60% (FFR 0.87), prox to mid LAD 40-50%, D1 50%, pCFX 50%, RCA stent ok, dPDA 80%, EF 55-60%.  His FFR was felt to be negative and therefore medical  therapy was recommended ;  echo 6/12: EF 55-60%, mild AS   . Chronic back pain   . CIDP (chronic inflammatory demyelinating polyneuropathy) ([place]) 04/11/2012  . Colon cancer ([place]) dx'd 2000   "left"  . COLONIC POLYPS, ADENOMATOUS, HX OF 05/28/2010  . Complication of anesthesia    "stopped breathing; related to my sleep apnea"   . COPD (chronic obstructive pulmonary disease) ([place])    "associated w/lung sarcoidosis"  . 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 DISH (diffuse idiopathic skeletal hyperostosis) 12/02/2015  . Diverticulitis   . GENERALIZED OSTEOARTHROSIS UNSPECIFIED SITE 05/28/2010  . GERD 05/28/2010  . Heart murmur   . History of gout   . HYPERLIPIDEMIA 05/28/2010  . Hypertension   . IgM lambda paraproteinemia   . 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 Malignant neoplasm of descending colon ([place]) 05/28/2010  . MITRAL VALVE PROLAPSE 05/28/2010  . 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 NSTEMI (non-ST elevated myocardial infarction) ([place]) 06/03/11  . 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  . OSA on CPAP 05/28/2010  . PALPITATIONS, CHRONIC 05/28/2010  . Parotid gland pain 2010   infection  . Peripheral neuropathy    "tx'd w/targeted chemo" (05/12/2016)  . Pneumonia 2-3 times  . Presence of permanent cardiac pacemaker   . 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 Sarcoidosis    pulmonalis  . UNSPECIFIED INFLAMMATORY AND TOXIC NEUROPATHY 05/28/2010  . Vision loss   . VITAMIN D DEFICIENCY 05/28/2010    PAST SURGICAL HISTORY: Past Surgical History:  Procedure Laterality Date  . APPENDECTOMY  1954  . CATARACT EXTRACTION W/ INTRAOCULAR LENS IMPLANT  Left   . COLON SURGERY  2000   decending colon   . CORONARY ANGIOPLASTY WITH STENT PLACEMENT  06/03/11  . ELECTROPHYSIOLOGIC STUDY N/A 05/12/2016   Procedure: Cardioversion;  Surgeon: [name], MD;  Location: [facility];  Service: Cardiovascular;  Laterality: N/A;  . EP  IMPLANTABLE DEVICE N/A 05/12/2016   Procedure: Pacemaker Implant;  Surgeon: [name], MD;  Location: [facility];  Service: Cardiovascular;  Laterality: N/A;  . INSERT / REPLACE / REMOVE PACEMAKER  05/12/2016  . KNEE ARTHROSCOPY Right 2003  . LEFT HEART CATHETERIZATION WITH CORONARY ANGIOGRAM N/A 03/31/2015   Procedure: LEFT HEART CATHETERIZATION WITH CORONARY ANGIOGRAM;  Surgeon: [name], MD;  Location: [facility];  Service: Cardiovascular;  Laterality: N/A;  . LUNG SURGERY  2001   "open lung dissection"  . MOHS SURGERY Left ~ 2008   ear  . PROSTATE BIOPSY  2010  . TONSILLECTOMY  1950    FAMILY HISTORY: Family History  Problem Relation Age of Onset  . Arthritis Mother        severe  . Coronary artery disease Mother   . Colon polyps Mother   . Heart disease Mother   . Coronary artery disease Brother   . Cancer Brother        choriocarcinoma  . Arrhythmia Brother 16       Afib/Tachycardia  . Heart attack Brother   . Congestive Heart Failure Brother     SOCIAL HISTORY: Social History   Socioeconomic History  . Marital status: Married    Spouse name: Not on file  . Number of children: 2  . Years of education: MS-Coll.  . Highest education level: Not on file  Occupational History  . Occupation: Optometrist    Comment: Building services engineer  Social Needs  . Financial resource strain: Not on file  . Food insecurity:    Worry: Not on file    Inability: Not on file  . Transportation needs:    Medical: Not on file    Non-medical: Not on file  Tobacco Use  . Smoking status: Never Smoker  . Smokeless tobacco: Never Used  Substance and Sexual Activity  . Alcohol use: Yes    Alcohol/week: 8.4 oz    Types: 14 Glasses of wine per week    Comment: 05/12/2016 "10oz wine qd"  . Drug use: No  . Sexual activity: Yes    Birth control/protection: None  Lifestyle  . Physical activity:    Days per week: Not on file    Minutes per session: Not on file    . Stress: Not on file  Relationships  . Social connections:    Talks on phone: Not on file    Gets together: Not on file    Attends religious service: Not on file    Active member of club or organization: Not on file    Attends meetings of clubs or organizations: Not on file    Relationship status: Not on file  . Intimate partner violence:    Fear of current or ex partner: Not on file    Emotionally abused: Not on file    Physically abused: Not on file    Forced sexual activity: Not on file  Other Topics Concern  . Not on file  Social History Narrative   College- [employer]; [employer]-environment science and mgt. Married [name])  "69" 1 son- 58; 1 dtr "33" 2 grandchildren. 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 End  of life-provided discussive context and provided packet.     PHYSICAL EXAM  Vitals:   07/26/18 1437  BP: 114/62  Pulse: 60  Weight: 195 lb 12.8 oz (88.8 kg)  Height: 5' 7.5" (1.715 m)   Body mass index is 30.21 kg/m.  Generalized: Well developed, obese male in no acute distress  Head: normocephalic and atraumatic,. Oropharynx benign mallopatti 2 Neck: Supple, circumference 16.75 Cardiac: Irregularly irregular  rate rhythm, no murmur  Musculoskeletal: No deformity   Neurological examination   Mentation: Alert. Montreal Cognitive Assessment  07/26/2018 01/26/2018  Visuospatial/ Executive (0/5) 2 4  Naming (0/3) 3 3  Attention: Read list of digits (0/2) 2 1  Attention: Read list of letters (0/1) 1 1  Attention: Serial 7 subtraction starting at 100 (0/3) 3 3  Language: Repeat phrase (0/2) 2 1  Language : Fluency (0/1) 1 0  Abstraction (0/2) 2 2  Delayed Recall (0/5) 4 3  Orientation (0/6) 5 6  Total 25 24  Previous MOCA 24.   Follows all commands speech and language fluent.   Cranial nerve II-XII: .Pupils were equal round reactive to light extraocular movements were full, visual field were full on confrontational test. Facial sensation and  strength were normal. hearing was intact to finger rubbing bilaterally. Uvula tongue midline. head turning and shoulder shrug were normal and symmetric.Tongue protrusion into cheek strength was normal. Motor: normal bulk and tone, full strength in the [name], BLE,Sensory: Decreased to fine touch  pinprick, and  Vibration, and proprioception Coordination: finger-nose-finger, heel-to-shin bilaterally, no dysmetria Reflexes: Diminished and symmetric plantar responses were flexor bilaterally. Gait and Station: Rising up from seated position without assistance, normal stance,  moderate stride, good arm swing, smooth turning, able to perform tiptoe, and heel walking without difficulty. Tandem gait is steady  DIAGNOSTIC DATA (LABS, IMAGING, TESTING) - I reviewed patient records, labs, notes, testing and imaging myself where available.  Lab Results  Component Value Date   WBC 4.2 05/29/2018   HGB 14.3 05/29/2018   HCT 40.2 05/29/2018   MCV 90 05/29/2018   PLT 187 05/29/2018      Component Value Date/Time   NA 141 05/29/2018 0806   NA 141 07/27/2016 0748   K 3.7 05/29/2018 0806   K 3.9 07/27/2016 0748   CL 101 05/29/2018 0806   CL 104 03/23/2013 0855   CO2 25 05/29/2018 0806   CO2 30 (H) 07/27/2016 0748   GLUCOSE 91 05/29/2018 0806   GLUCOSE 74 03/29/2017 1011   GLUCOSE 122 07/27/2016 0748   GLUCOSE 98 03/23/2013 0855   BUN 14 05/29/2018 0806   BUN 16.0 07/27/2016 0748   CREATININE 0.96 05/29/2018 0806   CREATININE 1.0 07/27/2016 0748   CALCIUM 10.2 05/29/2018 0806   CALCIUM 10.5 (H) 07/27/2016 0748   PROT 6.7 05/29/2018 0806   PROT 7.5 07/27/2016 0748   ALBUMIN 4.4 05/29/2018 0806   ALBUMIN 4.3 07/27/2016 0748   AST 20 05/29/2018 0806   AST 24 07/27/2016 0748   ALT 16 05/29/2018 0806   ALT 19 07/27/2016 0748   ALKPHOS 54 05/29/2018 0806   ALKPHOS 53 07/27/2016 0748   BILITOT 0.7 05/29/2018 0806   BILITOT 0.84 07/27/2016 0748   GFRNONAA 77 05/29/2018 0806   GFRAA 89 05/29/2018  0806   Lab Results  Component Value Date   CHOL 154 05/29/2018   HDL 60 05/29/2018   LDLCALC 84 05/29/2018   TRIG 52 05/29/2018   CHOLHDL 2.6 05/29/2018   Lab Results  Component Value Date   HGBA1C 5.1 06/13/2018    Lab Results  Component Value Date   TSH 3.02 03/29/2017      ASSESSMENT AND PLAN 77 year old male with history of neuropathy with progression after rituximab treatment was stopped now 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  He has hyperpathia, hyperesthesias and dyseasthesias have increased.  He has a left foot drop.  This is continued to be monitored by his hematologist Dr.Gorsuch.  He Also Has a History of Obstructive Sleep Apnea with  CPAP machine here for compliance. Data dated 06/25/2018 07/24/2018 shows compliance greater than 4 hours at 100% average use 9 hours 17 minutes .  Pressure set 5 to 15 cm.  EPR level 3 leak 95th percentile at 15 AHI 1.9   He is using nasal pillows ESS 2.  [facility] [employer].   PLAN: CPAP compliance 100% MOCA 25/30 Continue same settings  F/U yearly Dennie Bible, [facility], [facility], APRN  [facility] [address], [place], [place] [address] [phone]

## 2018-07-26 NOTE — Patient Instructions (Signed)
CPAP compliance 100% Continue same settings  F/U yearly  

## 2018-07-27 ENCOUNTER — Inpatient Hospital Stay: Payer: Medicare Other | Attending: Hematology and Oncology

## 2018-07-27 DIAGNOSIS — D472 Monoclonal gammopathy: Secondary | ICD-10-CM | POA: Diagnosis not present

## 2018-07-27 DIAGNOSIS — Z79899 Other long term (current) drug therapy: Secondary | ICD-10-CM | POA: Diagnosis not present

## 2018-07-27 DIAGNOSIS — G629 Polyneuropathy, unspecified: Secondary | ICD-10-CM | POA: Diagnosis not present

## 2018-07-27 DIAGNOSIS — R5382 Chronic fatigue, unspecified: Secondary | ICD-10-CM | POA: Insufficient documentation

## 2018-07-27 DIAGNOSIS — G6181 Chronic inflammatory demyelinating polyneuritis: Secondary | ICD-10-CM | POA: Diagnosis not present

## 2018-07-27 DIAGNOSIS — Z950 Presence of cardiac pacemaker: Secondary | ICD-10-CM | POA: Diagnosis not present

## 2018-07-27 DIAGNOSIS — R52 Pain, unspecified: Secondary | ICD-10-CM | POA: Diagnosis not present

## 2018-07-27 DIAGNOSIS — G4733 Obstructive sleep apnea (adult) (pediatric): Secondary | ICD-10-CM | POA: Insufficient documentation

## 2018-07-27 DIAGNOSIS — I2510 Atherosclerotic heart disease of native coronary artery without angina pectoris: Secondary | ICD-10-CM | POA: Diagnosis not present

## 2018-07-27 DIAGNOSIS — D869 Sarcoidosis, unspecified: Secondary | ICD-10-CM | POA: Insufficient documentation

## 2018-07-27 LAB — COMPREHENSIVE METABOLIC PANEL
ALT: 22 U/L (ref 0–44)
AST: 22 U/L (ref 15–41)
Albumin: 4.2 g/dL (ref 3.5–5.0)
Alkaline Phosphatase: 58 U/L (ref 38–126)
Anion gap: 9 (ref 5–15)
BUN: 15 mg/dL (ref 8–23)
CO2: 31 mmol/L (ref 22–32)
Calcium: 10.4 mg/dL — ABNORMAL HIGH (ref 8.9–10.3)
Chloride: 101 mmol/L (ref 98–111)
Creatinine, Ser: 1.02 mg/dL (ref 0.61–1.24)
GFR calc Af Amer: >60 mL/min (ref >60)
GFR calc non Af Amer: >60 mL/min (ref >60)
Glucose, Bld: 96 mg/dL (ref 70–99)
Potassium: 4.2 mmol/L (ref 3.5–5.1)
Sodium: 141 mmol/L (ref 135–145)
Total Bilirubin: 0.7 mg/dL (ref 0.3–1.2)
Total Protein: 7.4 g/dL (ref 6.5–8.1)

## 2018-07-27 LAB — CBC WITH DIFFERENTIAL/PLATELET
Basophils Absolute: 0 10*3/uL (ref 0.0–0.1)
Basophils Relative: 1 %
Eosinophils Absolute: 0.2 10*3/uL (ref 0.0–0.5)
Eosinophils Relative: 3 %
HCT: 40.1 % (ref 38.4–49.9)
Hemoglobin: 13.6 g/dL (ref 13.0–17.1)
Lymphocytes Relative: 16 %
Lymphs Abs: 0.9 10*3/uL (ref 0.9–3.3)
MCH: 31.3 pg (ref 27.2–33.4)
MCHC: 34 g/dL (ref 32.0–36.0)
MCV: 92.1 fL (ref 79.3–98.0)
Monocytes Absolute: 0.5 10*3/uL (ref 0.1–0.9)
Monocytes Relative: 10 %
Neutro Abs: 3.8 10*3/uL (ref 1.5–6.5)
Neutrophils Relative %: 70 %
Platelets: 186 10*3/uL (ref 140–400)
RBC: 4.35 MIL/uL (ref 4.20–5.82)
RDW: 13.3 % (ref 11.0–14.6)
WBC: 5.4 10*3/uL (ref 4.0–10.3)

## 2018-07-28 ENCOUNTER — Ambulatory Visit: Payer: Self-pay | Admitting: *Deleted

## 2018-07-28 ENCOUNTER — Ambulatory Visit (INDEPENDENT_AMBULATORY_CARE_PROVIDER_SITE_OTHER): Payer: Medicare Other | Admitting: Family

## 2018-07-28 ENCOUNTER — Encounter: Payer: Self-pay | Admitting: Family

## 2018-07-28 VITALS — BP 106/68 | HR 70 | Temp 98.3°F | Ht 67.5 in | Wt 194.0 lb

## 2018-07-28 DIAGNOSIS — J209 Acute bronchitis, unspecified: Secondary | ICD-10-CM

## 2018-07-28 DIAGNOSIS — J4521 Mild intermittent asthma with (acute) exacerbation: Secondary | ICD-10-CM

## 2018-07-28 DIAGNOSIS — D869 Sarcoidosis, unspecified: Secondary | ICD-10-CM | POA: Diagnosis not present

## 2018-07-28 LAB — KAPPA/LAMBDA LIGHT CHAINS
Kappa free light chain: 22.6 mg/L — ABNORMAL HIGH (ref 3.3–19.4)
Kappa, lambda light chain ratio: 1.54 (ref 0.26–1.65)
Lambda free light chains: 14.7 mg/L (ref 5.7–26.3)

## 2018-07-28 MED ORDER — PREDNISONE 20 MG PO TABS: 20.0000 mg | ORAL_TABLET | Freq: Every day | ORAL | 0 refills | Status: DC

## 2018-07-28 MED ORDER — FLUTICASONE PROPIONATE (INHAL) 100 MCG/BLIST IN AEPB: 1.0000 | INHALATION_SPRAY | Freq: Two times a day (BID) | RESPIRATORY_TRACT | 1 refills | Status: DC

## 2018-07-28 MED ORDER — ALBUTEROL SULFATE HFA 108 (90 BASE) MCG/ACT IN AERS: 2.0000 | INHALATION_SPRAY | Freq: Four times a day (QID) | RESPIRATORY_TRACT | 1 refills | Status: DC | PRN

## 2018-07-28 MED ORDER — DOXYCYCLINE HYCLATE 100 MG PO TABS
100.0000 mg | ORAL_TABLET | Freq: Two times a day (BID) | ORAL | 0 refills | Status: DC
Start: 2018-07-28 — End: 2018-08-17

## 2018-07-28 NOTE — Telephone Encounter (Addendum)
Pt called with congestion in lungs and intermittent  asthma symptoms (can't breathe when coughing, bronchial spasms); the pt states that he got a viral infection 10 days ago and his symptoms have been ongoing; he reports a productive cough with sputum that is yellow to green; he has used albuterol inhaler which he says made his lung symptoms better; the states that he is out of pulmicort inhaler and almost out of albuterol; the pt states that his temperature is 96.6; nurse triage recommendations made per protocol to include being seen in office today; pt is normally seen by Dr [name] but she nor any providers in [place] have availability today; pt offered and accepted appointment with Jodi Mourning, [facility], today at 1520; will route to office for notification of this upcoming appointment; will also route to [place] for notification.    Reason for Disposition . [1] MILD difficulty breathing (e.g., minimal/no SOB at rest, SOB with walking, pulse <100) AND [2] NEW-onset or WORSE than normal  Answer Assessment - Initial Assessment Questions 1. RESPIRATORY STATUS: "Describe your breathing?" (e.g., wheezing, shortness of breath, unable to speak, severe coughing)      Wheezing, congestion in lungs 2. ONSET: "When did this breathing problem begin?"      07/18/18 3. PATTERN "Does the difficult breathing come and go, or has it been constant since it started?"      constant 4. SEVERITY: "How bad is your breathing?" (e.g., mild, moderate, severe)    - MILD: No SOB at rest, mild SOB with walking, speaks normally in sentences, can lay down, no retractions, pulse < 100.    - MODERATE: SOB at rest, SOB with minimal exertion and prefers to sit, cannot lie down flat, speaks in phrases, mild retractions, audible wheezing, pulse 100-120.    - SEVERE: Very SOB at rest, speaks in single words, struggling to breathe, sitting hunched forward, retractions, pulse > 120      mild 5. RECURRENT SYMPTOM: "Have  you had difficulty breathing before?" If so, ask: "When was the last time?" and "What happened that time?"      Yes hx asthma and sarcoidosis 6. CARDIAC HISTORY: "Do you have any history of heart disease?" (e.g., heart attack, angina, bypass surgery, angioplasty)      yes 7. LUNG HISTORY: "Do you have any history of lung disease?"  (e.g., pulmonary embolus, asthma, emphysema)      Yes hx asthma and sarcoidosis 8. CAUSE: "What do you think is causing the breathing problem?"     Got sick with viral infection 9. OTHER SYMPTOMS: "Do you have any other symptoms? (e.g., dizziness, runny nose, cough, chest pain, fever)     Cough, nasal congestion 10. PREGNANCY: "Is there any chance you are pregnant?" "When was your last menstrual period?"       n/a 11. TRAVEL: "Have you traveled out of the country in the last month?" (e.g., travel history, exposures)       no  Protocols used: BREATHING DIFFICULTY-A-AH

## 2018-07-28 NOTE — Progress Notes (Signed)
Derrick [name] is a 77 y.o. male with the following history as recorded in EpicCare:  Patient Active Problem List   Diagnosis Date Noted  . Allergic response, initial encounter 06/01/2018  . Tick bite 06/01/2018  . 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 MCI (mild cognitive impairment) 01/26/2018  . Pacemaker ECG pattern 11/30/2017  . Bradycardia, sinus 11/30/2017  . Ventricular tachycardia (paroxysmal) ([place]) 11/30/2017  . Monocytosis 06/10/2017  . Prediabetes 03/29/2017  . S/P placement of cardiac pacemaker 08/03/2016  . AF (atrial fibrillation) ([place]) 05/12/2016  . Bradycardia 02/09/2016  . DISH (diffuse idiopathic skeletal hyperostosis) 12/02/2015  . Abnormal stress electrocardiogram test using treadmill   . Coronary artery disease involving coronary bypass graft of native heart without angina pectoris 03/26/2015  . 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 OSA on CPAP 10/16/2014  . 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 Essential hypertension, benign 05/07/2014  . Routine health maintenance 12/16/2013  . Neuropathy associated with MGUS ([place]) 08/08/2013  . CIDP (chronic inflammatory demyelinating polyneuropathy) ([place]) 04/11/2012  . Allergic rhinitis 02/21/2012  . Coronary atherosclerosis of native coronary artery 06/22/2011  . Malignant basal cell neoplasm of skin 06/16/2011  . 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 Malignant neoplasm of descending colon ([place]) 05/28/2010  . Vitamin D deficiency 05/28/2010  . Hyperlipidemia with target LDL less than 100 05/28/2010  . Asthma exacerbation 05/28/2010  . GERD 05/28/2010  . BPH associated with nocturia 05/28/2010  . Osteoarthritis of both hands 05/28/2010    Current Outpatient Medications  Medication Sig Dispense Refill  . albuterol  (PROVENTIL HFA;VENTOLIN HFA) 108 (90 Base) MCG/ACT inhaler Inhale 2 puffs into the lungs every 6 (six) hours as needed for wheezing or shortness of breath. 1 Inhaler 1  . amLODipine (NORVASC) 10 MG tablet TAKE 1 TABLET BY MOUTH ONCE DAILY 90 tablet 3  . atorvastatin (LIPITOR) 20 MG tablet Take 1 tablet (20 mg total) by mouth daily. Please keep upcoming appt. Thank you 90 tablet 2  . bimatoprost (LUMIGAN) 0.01 % SOLN Place 1 drop into both eyes at bedtime.    . CHOLECALCIFEROL PO Take 1,000 mg by mouth daily. Vitamin D 3    . dorzolamide-timolol (COSOPT) 22.3-6.8 MG/ML ophthalmic solution Place 1 drop into both eyes 2 (two) times daily.     [name] ELIQUIS 5 MG TABS tablet TAKE 1 TABLET BY MOUTH TWICE DAILY 180 tablet 2  . finasteride (PROSCAR) 5 MG tablet Take 5 mg by mouth daily.     . fluticasone (FLONASE) 50 MCG/ACT nasal spray Place 2 sprays into both nostrils 2 (two) times daily.    [name] gabapentin (NEURONTIN) 800 MG tablet Take 0.5-1 tablets (400-800 mg total) by mouth 3 (three) times daily. 270 tablet 2  . MULTAQ 400 MG tablet TAKE 1 TABLET BY MOUTH TWICE DAILY WITH MEALS 180 tablet 3  . pantoprazole (PROTONIX) 40 MG tablet Take 1 tablet (40 mg total) by mouth daily. 90 tablet 2  . tadalafil (CIALIS) 5 MG tablet Take 5 mg by mouth daily. Reported on 05/26/2016    . triamterene-hydrochlorothiazide (MAXZIDE-25) 37.5-25 MG tablet TAKE 1 TABLET BY MOUTH ONCE DAILY 90 tablet 3  . doxycycline (VIBRA-TABS) 100 MG tablet Take 1 tablet (100 mg total) by mouth 2 (two) times daily. 20 tablet 0  . Fluticasone Propionate, Inhal, (FLOVENT DISKUS) 100 MCG/BLIST AEPB Inhale 1 Inhaler into the lungs  2 (two) times daily. 60 each 1  . predniSONE (DELTASONE) 20 MG tablet Take 1 tablet (20 mg total) by mouth daily with breakfast. 5 tablet 0   Current Facility-Administered Medications  Medication Dose Route Frequency Provider Last Rate Last Dose  . 0.9 %  sodium chloride infusion  500 mL Intravenous Continuous Pyrtle,  Lajuan Lines, MD        Allergies: Patient has no known allergies.  Past Medical History:  Diagnosis Date  . BENIGN PROSTATIC HYPERTROPHY, WITH OBSTRUCTION 05/28/2010  . CAD (coronary artery disease)    a. s/p NSTEMI 06/01/11: DES to RCA;  b. cath 06/25/11:   dLM 50-60% (FFR 0.87), prox to mid LAD 40-50%, D1 50%, pCFX 50%, RCA stent ok, dPDA 80%, EF 55-60%.  His FFR was felt to be negative and therefore medical therapy was recommended ;  echo 6/12: EF 55-60%, mild AS   . Chronic back pain   . CIDP (chronic inflammatory demyelinating polyneuropathy) ([place]) 04/11/2012  . Colon cancer ([place]) dx'd 2000   "left"  . COLONIC POLYPS, ADENOMATOUS, HX OF 05/28/2010  . Complication of anesthesia    "stopped breathing; related to my sleep apnea"   . COPD (chronic obstructive pulmonary disease) ([place])    "associated w/lung sarcoidosis"  . 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 DISH (diffuse idiopathic skeletal hyperostosis) 12/02/2015  . Diverticulitis   . GENERALIZED OSTEOARTHROSIS UNSPECIFIED SITE 05/28/2010  . GERD 05/28/2010  . Heart murmur   . History of gout   . HYPERLIPIDEMIA 05/28/2010  . Hypertension   . IgM lambda paraproteinemia   . 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 Malignant neoplasm of descending colon ([place]) 05/28/2010  . MITRAL VALVE PROLAPSE 05/28/2010  . 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 NSTEMI (non-ST elevated myocardial infarction) ([place]) 06/03/11  . 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  . OSA on CPAP 05/28/2010  . PALPITATIONS, CHRONIC 05/28/2010  . Parotid gland pain 2010   infection  . Peripheral neuropathy    "tx'd w/targeted chemo" (05/12/2016)  . Pneumonia 2-3 times  . Presence of permanent cardiac pacemaker   . 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 Sarcoidosis    pulmonalis  . UNSPECIFIED INFLAMMATORY AND TOXIC NEUROPATHY 05/28/2010  . Vision loss   . VITAMIN D DEFICIENCY 05/28/2010    Past Surgical History:  Procedure  Laterality Date  . APPENDECTOMY  1954  . CATARACT EXTRACTION W/ INTRAOCULAR LENS IMPLANT Left   . COLON SURGERY  2000   decending colon   . CORONARY ANGIOPLASTY WITH STENT PLACEMENT  06/03/11  . ELECTROPHYSIOLOGIC STUDY N/A 05/12/2016   Procedure: Cardioversion;  Surgeon: [name], MD;  Location: [facility];  Service: Cardiovascular;  Laterality: N/A;  . EP IMPLANTABLE DEVICE N/A 05/12/2016   Procedure: Pacemaker Implant;  Surgeon: [name], MD;  Location: [facility];  Service: Cardiovascular;  Laterality: N/A;  . INSERT / REPLACE / REMOVE PACEMAKER  05/12/2016  . KNEE ARTHROSCOPY Right 2003  . LEFT HEART CATHETERIZATION WITH CORONARY ANGIOGRAM N/A 03/31/2015   Procedure: LEFT HEART CATHETERIZATION WITH CORONARY ANGIOGRAM;  Surgeon: [name], MD;  [facility] C[facility];  Service: Cardiovascular;  Laterality: N/A;  . LUNG SURGERY  2001   "open lung dissection"  . MOHS SURGERY Left ~ 2008   ear  . PROSTATE BIOPSY  2010  . TONSILLECTOMY  1950    Family History  Problem Relation Age of Onset  . Arthritis Mother  severe  . Coronary artery disease Mother   . Colon polyps Mother   . Heart disease Mother   . Coronary artery disease Brother   . Cancer Brother        choriocarcinoma  . Arrhythmia Brother 39       Afib/Tachycardia  . Heart attack Brother   . Congestive Heart Failure Brother     Social History   Tobacco Use  . Smoking status: Never Smoker  . Smokeless tobacco: Never Used  Substance Use Topics  . Alcohol use: Yes    Alcohol/week: 8.4 oz    Types: 14 Glasses of wine per week    Comment: 05/12/2016 "10oz wine qd"    Subjective:  Patient started with URI symptoms approximately 2 weeks ago; feels like it has flared his sarcoid/ asthma- requesting refill on Pulmicort; also needs updated prescription for his albuterol; requesting treatment with antibiotics and oral steroids today; had episode of chest tightness this morning- used  albuterol with limited benefit; no fever; worried that symptoms left untreated could progress to pneumonia.    Objective:  Vitals:   07/28/18 1506  BP: 106/68  Pulse: 70  Temp: 98.3 F (36.8 C)  TempSrc: Oral  SpO2: 97%  Weight: 194 lb (88 kg)  Height: 5' 7.5" (1.715 m)    General: Well developed, well nourished, in no acute distress  Skin : Warm and dry.  Head: Normocephalic and atraumatic  Eyes: Sclera and conjunctiva clear; pupils round and reactive to light; extraocular movements intact  Ears: External normal; canals clear; tympanic membranes normal  Oropharynx: Pink, supple. No suspicious lesions  Neck: Supple without thyromegaly, adenopathy  Lungs: Respirations unlabored; clear to auscultation bilaterally without wheeze, rales, rhonchi  CVS exam: normal rate and regular rhythm.  Neurologic: Alert and oriented; speech intact; face symmetrical; moves all extremities well; CNII-XII intact without focal deficit   Assessment:  1. Acute bronchitis, unspecified organism   2. Exacerbation of intermittent asthma, unspecified asthma severity   3. PULMONARY SARCOIDOSIS     Plan:  Rx for Doxycycline 100 mg bid x 10 days; use oral prednisone 20 mg x 5 days; Rx for Flovent as insurance prefers this to Pulmicort- he will use this bid x 1 month; refill also given on albuterol inhaler; increase fluids, rest; follow-up worse, no better and will need to update CXR.    No follow-ups on file.  No orders of the defined types were placed in this encounter.   Requested Prescriptions   Signed Prescriptions Disp Refills  . Fluticasone Propionate, Inhal, (FLOVENT DISKUS) 100 MCG/BLIST AEPB 60 each 1    Sig: Inhale 1 Inhaler into the lungs 2 (two) times daily.  [name] albuterol (PROVENTIL HFA;VENTOLIN HFA) 108 (90 Base) MCG/ACT inhaler 1 Inhaler 1    Sig: Inhale 2 puffs into the lungs every 6 (six) hours as needed for wheezing or shortness of breath.  . predniSONE (DELTASONE) 20 MG tablet 5 tablet 0     Sig: Take 1 tablet (20 mg total) by mouth daily with breakfast.  . doxycycline (VIBRA-TABS) 100 MG tablet 20 tablet 0    Sig: Take 1 tablet (100 mg total) by mouth 2 (two) times daily.

## 2018-07-31 DIAGNOSIS — M1812 Unilateral primary osteoarthritis of first carpometacarpal joint, left hand: Secondary | ICD-10-CM | POA: Diagnosis not present

## 2018-07-31 DIAGNOSIS — G5603 Carpal tunnel syndrome, bilateral upper limbs: Secondary | ICD-10-CM | POA: Diagnosis not present

## 2018-07-31 LAB — MULTIPLE MYELOMA PANEL, SERUM
Albumin SerPl Elph-Mcnc: 3.8 g/dL (ref 2.9–4.4)
Albumin/Glob SerPl: 1.3 (ref 0.7–1.7)
Alpha 1: 0.2 g/dL (ref 0.0–0.4)
Alpha2 Glob SerPl Elph-Mcnc: 0.7 g/dL (ref 0.4–1.0)
B-Globulin SerPl Elph-Mcnc: 1 g/dL (ref 0.7–1.3)
Gamma Glob SerPl Elph-Mcnc: 1 g/dL (ref 0.4–1.8)
Globulin, Total: 3 g/dL (ref 2.2–3.9)
IgA: 218 mg/dL (ref 61–437)
IgG (Immunoglobin G), Serum: 790 mg/dL (ref 700–1600)
IgM (Immunoglobulin M), Srm: 341 mg/dL — ABNORMAL HIGH (ref 15–143)
M Protein SerPl Elph-Mcnc: 0.2 g/dL — ABNORMAL HIGH
Total Protein ELP: 6.8 g/dL (ref 6.0–8.5)

## 2018-08-03 ENCOUNTER — Inpatient Hospital Stay (HOSPITAL_BASED_OUTPATIENT_CLINIC_OR_DEPARTMENT_OTHER): Payer: Medicare Other | Admitting: Hematology and Oncology

## 2018-08-03 ENCOUNTER — Telehealth: Payer: Self-pay | Admitting: Hematology and Oncology

## 2018-08-03 ENCOUNTER — Encounter: Payer: Self-pay | Admitting: Hematology and Oncology

## 2018-08-03 DIAGNOSIS — I2510 Atherosclerotic heart disease of native coronary artery without angina pectoris: Secondary | ICD-10-CM

## 2018-08-03 DIAGNOSIS — G4733 Obstructive sleep apnea (adult) (pediatric): Secondary | ICD-10-CM | POA: Diagnosis not present

## 2018-08-03 DIAGNOSIS — Z950 Presence of cardiac pacemaker: Secondary | ICD-10-CM | POA: Diagnosis not present

## 2018-08-03 DIAGNOSIS — G6181 Chronic inflammatory demyelinating polyneuritis: Secondary | ICD-10-CM | POA: Diagnosis not present

## 2018-08-03 DIAGNOSIS — R5382 Chronic fatigue, unspecified: Secondary | ICD-10-CM | POA: Diagnosis not present

## 2018-08-03 DIAGNOSIS — Z79899 Other long term (current) drug therapy: Secondary | ICD-10-CM | POA: Diagnosis not present

## 2018-08-03 DIAGNOSIS — D472 Monoclonal gammopathy: Secondary | ICD-10-CM

## 2018-08-03 NOTE — Assessment & Plan Note (Signed)
The patient had pacemaker due to arrhythmia Again, I felt that amyloidosis should be ruled out due to potential risk of systemic events In the meantime, he will continue treatment and management by cardiologist

## 2018-08-03 NOTE — Progress Notes (Signed)
[name]  Patient Care Team: [name], MD as PCP - General (Family Medicine) [name], MD as PCP - Cardiology (Cardiology) Heath Lark, MD as Consulting Physician (Hematology and Oncology)  ASSESSMENT & PLAN:  MGUS (monoclonal gammopathy of unknown significance) IgM level is within normal limits and M spike is stable. He is not symptomatic apart from his worsening neuropathy Due to worsening symptoms and risk of falls, I recommend bone marrow biopsy to exclude amyloidosis and he agreed to proceed I will perform bone marrow tomorrow  CIDP (chronic inflammatory demyelinating polyneuropathy) This is worse. I discussed the rare situation where MGUS can cause amyloidosis which in turn can cause worsening neuropathy We discussed the risk and benefits of pursuing bone marrow biopsy and he agreed to proceed He will continue to see neurologist in the meantime  S/P placement of cardiac pacemaker The patient had pacemaker due to arrhythmia Again, I felt that amyloidosis should be ruled out due to potential risk of systemic events In the meantime, he will continue treatment and management by cardiologist   Orders Placed This Encounter  Procedures  . CBC with Differential/Platelet    Standing Status:   Future    Standing Expiration Date:   09/07/2019  . CBC with Differential/Platelet    Standing Status:   Future    Standing Expiration Date:   09/07/2019    INTERVAL HISTORY: Please see below for problem oriented charting. He returns for further follow-up Since the last time I saw him, he felt worse and had progressive neuropathy to the point of frequent falls He denies recent congestive heart failure He was recently treated for upper respiratory tract infection with doxycycline and prednisone His symptoms has resolved  SUMMARY OF ONCOLOGIC HISTORY:  Derrick [name] was transferred to my care after his prior physician has left.  I reviewed  the patient's records extensive and collaborated the history with the patient. 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 He was initially evaluated and treated by a hematologist and a neurologist in [place] before he moved to [place]. 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 He had bone marrow aspirate and biopsy which showed no evidence of malignancy in his bone marrow. His neurologic condition is characterized primarily by painful distal neuropathy, feet worse than hands, and lower extremity weakness. At one point he could barely walk a block without having to stop due to pain and weakness. He was given an initial trial of Rituxan in 2010 with no significant improvement. However following reinitiation of Rituxan in April 2012 he did get a progressive improvement. He was put on a maintenance program which we have continued in this office on an every other month basis through 03/23/2013.  The patient have new onset atrial fibrillation. His echocardiogram shows significant dilated atrium without evidence of increase ventricular wall thickness. 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  REVIEW OF SYSTEMS:   Constitutional: Denies fevers, chills or abnormal weight loss Eyes: Denies blurriness of vision Ears, nose, mouth, throat, and face: Denies mucositis or sore throat Respiratory: Denies cough, dyspnea or wheezes Cardiovascular: Denies palpitation, chest discomfort or lower extremity swelling Gastrointestinal:  Denies nausea, heartburn or change in bowel habits Skin: Denies abnormal skin rashes Lymphatics: Denies new lymphadenopathy or easy bruising Behavioral/Psych: Mood  is stable, no new changes  All other systems were reviewed with the patient and are negative.  I  have reviewed the past medical history, past surgical history, social history and family history with the patient and they are unchanged from previous note.  ALLERGIES:  has No Known Allergies.  MEDICATIONS:  Current Outpatient Medications  Medication Sig Dispense Refill  . albuterol (PROVENTIL HFA;VENTOLIN HFA) 108 (90 Base) MCG/ACT inhaler Inhale 2 puffs into the lungs every 6 (six) hours as needed for wheezing or shortness of breath. 1 Inhaler 1  . amLODipine (NORVASC) 10 MG tablet TAKE 1 TABLET BY MOUTH ONCE DAILY 90 tablet 3  . atorvastatin (LIPITOR) 20 MG tablet Take 1 tablet (20 mg total) by mouth daily. Please keep upcoming appt. Thank you 90 tablet 2  . bimatoprost (LUMIGAN) 0.01 % SOLN Place 1 drop into both eyes at bedtime.    . CHOLECALCIFEROL PO Take 1,000 mg by mouth daily. Vitamin D 3    . dorzolamide-timolol (COSOPT) 22.3-6.8 MG/ML ophthalmic solution Place 1 drop into both eyes 2 (two) times daily.     [name] doxycycline (VIBRA-TABS) 100 MG tablet Take 1 tablet (100 mg total) by mouth 2 (two) times daily. 20 tablet 0  . ELIQUIS 5 MG TABS tablet TAKE 1 TABLET BY MOUTH TWICE DAILY 180 tablet 2  . finasteride (PROSCAR) 5 MG tablet Take 5 mg by mouth daily.     . fluticasone (FLONASE) 50 MCG/ACT nasal spray Place 2 sprays into both nostrils 2 (two) times daily.    . Fluticasone Propionate, Inhal, (FLOVENT DISKUS) 100 MCG/BLIST AEPB Inhale 1 Inhaler into the lungs 2 (two) times daily. 60 each 1  . gabapentin (NEURONTIN) 800 MG tablet Take 0.5-1 tablets (400-800 mg total) by mouth 3 (three) times daily. 270 tablet 2  . MULTAQ 400 MG tablet TAKE 1 TABLET BY MOUTH TWICE DAILY WITH MEALS 180 tablet 3  . pantoprazole (PROTONIX) 40 MG tablet Take 1 tablet (40 mg total) by mouth daily. 90 tablet 2  . predniSONE (DELTASONE) 20 MG tablet Take 1 tablet (20 mg total) by mouth daily with breakfast. 5 tablet 0  . tadalafil (CIALIS) 5 MG tablet Take 5 mg by mouth daily. Reported on 05/26/2016    .  triamterene-hydrochlorothiazide (MAXZIDE-25) 37.5-25 MG tablet TAKE 1 TABLET BY MOUTH ONCE DAILY 90 tablet 3   Current Facility-Administered Medications  Medication Dose Route Frequency Provider Last Rate Last Dose  . 0.9 %  sodium chloride infusion  500 mL Intravenous Continuous Pyrtle, Lajuan Lines, MD        PHYSICAL EXAMINATION: ECOG PERFORMANCE STATUS: 2 - Symptomatic, <50% confined to bed  Vitals:   08/03/18 0812  BP: (!) 148/66  Pulse: 76  Resp: 18  Temp: 97.9 F (36.6 C)  SpO2: 99%   Filed Weights   08/03/18 0812  Weight: 194 lb 6.4 oz (88.2 kg)    GENERAL:alert, no distress and comfortable SKIN: skin color, texture, turgor are normal, no rashes or significant lesions EYES: normal, Conjunctiva are pink and non-injected, sclera clear OROPHARYNX:no exudate, no erythema and lips, buccal mucosa, and tongue normal  NECK: supple, thyroid normal size, non-tender, without nodularity LYMPH:  no palpable lymphadenopathy in the cervical, axillary or inguinal LUNGS: clear to auscultation and percussion with normal breathing effort HEART: regular rate & rhythm and no murmurs and no lower extremity edema ABDOMEN:abdomen soft, non-tender and normal bowel sounds Musculoskeletal:no cyanosis of digits and no clubbing  NEURO: alert & oriented x 3 with  fluent speech, no focal motor/sensory deficits  LABORATORY DATA:  I have reviewed the data as listed    Component Value Date/Time   NA 141 07/27/2018 0746   NA 141 05/29/2018 0806   NA 141 07/27/2016 0748   K 4.2 07/27/2018 0746   K 3.9 07/27/2016 0748   CL 101 07/27/2018 0746   CL 104 03/23/2013 0855   CO2 31 07/27/2018 0746   CO2 30 (H) 07/27/2016 0748   GLUCOSE 96 07/27/2018 0746   GLUCOSE 122 07/27/2016 0748   GLUCOSE 98 03/23/2013 0855   BUN 15 07/27/2018 0746   BUN 14 05/29/2018 0806   BUN 16.0 07/27/2016 0748   CREATININE 1.02 07/27/2018 0746   CREATININE 1.0 07/27/2016 0748   CALCIUM 10.4 (H) 07/27/2018 0746   CALCIUM  10.5 (H) 07/27/2016 0748   PROT 7.4 07/27/2018 0746   PROT 6.7 05/29/2018 0806   PROT 7.5 07/27/2016 0748   ALBUMIN 4.2 07/27/2018 0746   ALBUMIN 4.4 05/29/2018 0806   ALBUMIN 4.3 07/27/2016 0748   AST 22 07/27/2018 0746   AST 24 07/27/2016 0748   ALT 22 07/27/2018 0746   ALT 19 07/27/2016 0748   ALKPHOS 58 07/27/2018 0746   ALKPHOS 53 07/27/2016 0748   BILITOT 0.7 07/27/2018 0746   BILITOT 0.7 05/29/2018 0806   BILITOT 0.84 07/27/2016 0748   GFRNONAA >60 07/27/2018 0746   GFRAA >60 07/27/2018 0746    No results found for: SPEP, UPEP  Lab Results  Component Value Date   WBC 5.4 07/27/2018   NEUTROABS 3.8 07/27/2018   HGB 13.6 07/27/2018   HCT 40.1 07/27/2018   MCV 92.1 07/27/2018   PLT 186 07/27/2018      Chemistry      Component Value Date/Time   NA 141 07/27/2018 0746   NA 141 05/29/2018 0806   NA 141 07/27/2016 0748   K 4.2 07/27/2018 0746   K 3.9 07/27/2016 0748   CL 101 07/27/2018 0746   CL 104 03/23/2013 0855   CO2 31 07/27/2018 0746   CO2 30 (H) 07/27/2016 0748   BUN 15 07/27/2018 0746   BUN 14 05/29/2018 0806   BUN 16.0 07/27/2016 0748   CREATININE 1.02 07/27/2018 0746   CREATININE 1.0 07/27/2016 0748      Component Value Date/Time   CALCIUM 10.4 (H) 07/27/2018 0746   CALCIUM 10.5 (H) 07/27/2016 0748   ALKPHOS 58 07/27/2018 0746   ALKPHOS 53 07/27/2016 0748   AST 22 07/27/2018 0746   AST 24 07/27/2016 0748   ALT 22 07/27/2018 0746   ALT 19 07/27/2016 0748   BILITOT 0.7 07/27/2018 0746   BILITOT 0.7 05/29/2018 0806   BILITOT 0.84 07/27/2016 0748     All questions were answered. The patient knows to call the clinic with any problems, questions or concerns. No barriers to learning was detected.  I spent 15 minutes counseling the patient face to face. The total time spent in the appointment was 20 minutes and more than 50% was on counseling and review of test results  Heath Lark, MD 08/03/2018 3:28 PM

## 2018-08-03 NOTE — Telephone Encounter (Signed)
Patient called in to schedule BM BX,  Labs and follow up visit appointments scheduled as per 8/8 los

## 2018-08-03 NOTE — Assessment & Plan Note (Signed)
This is worse. I discussed the rare situation where MGUS can cause amyloidosis which in turn can cause worsening neuropathy We discussed the risk and benefits of pursuing bone marrow biopsy and he agreed to proceed He will continue to see neurologist in the meantime

## 2018-08-03 NOTE — Assessment & Plan Note (Signed)
IgM level is within normal limits and M spike is stable. He is not symptomatic apart from his worsening neuropathy Due to worsening symptoms and risk of falls, I recommend bone marrow biopsy to exclude amyloidosis and he agreed to proceed I will perform bone marrow tomorrow

## 2018-08-04 ENCOUNTER — Inpatient Hospital Stay: Payer: Medicare Other

## 2018-08-04 ENCOUNTER — Inpatient Hospital Stay (HOSPITAL_BASED_OUTPATIENT_CLINIC_OR_DEPARTMENT_OTHER): Payer: Medicare Other | Admitting: Hematology and Oncology

## 2018-08-04 VITALS — BP 144/85 | HR 75 | Temp 98.1°F | Resp 16

## 2018-08-04 DIAGNOSIS — G4733 Obstructive sleep apnea (adult) (pediatric): Secondary | ICD-10-CM | POA: Diagnosis not present

## 2018-08-04 DIAGNOSIS — D472 Monoclonal gammopathy: Secondary | ICD-10-CM

## 2018-08-04 DIAGNOSIS — G6181 Chronic inflammatory demyelinating polyneuritis: Secondary | ICD-10-CM | POA: Diagnosis not present

## 2018-08-04 DIAGNOSIS — R5382 Chronic fatigue, unspecified: Secondary | ICD-10-CM | POA: Diagnosis not present

## 2018-08-04 DIAGNOSIS — Z79899 Other long term (current) drug therapy: Secondary | ICD-10-CM | POA: Diagnosis not present

## 2018-08-04 DIAGNOSIS — I2510 Atherosclerotic heart disease of native coronary artery without angina pectoris: Secondary | ICD-10-CM | POA: Diagnosis not present

## 2018-08-04 DIAGNOSIS — D7589 Other specified diseases of blood and blood-forming organs: Secondary | ICD-10-CM | POA: Diagnosis not present

## 2018-08-04 LAB — CBC WITH DIFFERENTIAL/PLATELET
Basophils Absolute: 0 10*3/uL (ref 0.0–0.1)
Basophils Relative: 1 %
Eosinophils Absolute: 0.1 10*3/uL (ref 0.0–0.5)
Eosinophils Relative: 2 %
HCT: 41.7 % (ref 38.4–49.9)
Hemoglobin: 14.1 g/dL (ref 13.0–17.1)
Lymphocytes Relative: 24 %
Lymphs Abs: 1.4 10*3/uL (ref 0.9–3.3)
MCH: 31.2 pg (ref 27.2–33.4)
MCHC: 33.8 g/dL (ref 32.0–36.0)
MCV: 92.3 fL (ref 79.3–98.0)
Monocytes Absolute: 0.8 10*3/uL (ref 0.1–0.9)
Monocytes Relative: 13 %
Neutro Abs: 3.5 10*3/uL (ref 1.5–6.5)
Neutrophils Relative %: 60 %
Platelets: 184 10*3/uL (ref 140–400)
RBC: 4.52 MIL/uL (ref 4.20–5.82)
RDW: 13.7 % (ref 11.0–14.6)
WBC: 5.8 10*3/uL (ref 4.0–10.3)

## 2018-08-04 NOTE — Progress Notes (Signed)
Bone Marrow Biopsy and Aspiration Procedure Note   Informed consent was obtained and potential risks including bleeding, infection and pain were reviewed with the patient.  The patient's name, date of birth, identification, consent and allergies were verified prior to the start of procedure and time out was performed.  The right posterior iliac crest was chosen as the site of biopsy.  The skin was prepped with Betadine solution.   8 cc of 1% lidocaine was used to provide local anaesthesia.   10 cc of bone marrow aspirate was obtained followed by 1 inch biopsy.   The procedure was tolerated well and there were no complications.  The patient was stable at the end of the procedure.  Specimens sent for flow cytometry, cytogenetics and additional studies.  

## 2018-08-04 NOTE — Progress Notes (Signed)
Bandage clean/dry/intact at discharge post BM bx

## 2018-08-04 NOTE — Patient Instructions (Signed)
Bone Marrow Aspiration and Bone Marrow Biopsy, Adult, Care After This sheet gives you information about how to care for yourself after your procedure. Your health care provider may also give you more specific instructions. If you have problems or questions, contact your health care provider. What can I expect after the procedure? After the procedure, it is common to have:  Mild pain and tenderness.  Swelling.  Bruising.  Follow these instructions at home:  Take over-the-counter or prescription medicines only as told by your health care provider.  Do not take baths, swim, or use a hot tub until your health care provider approves. Ask if you can take a shower or have a sponge bath.  Follow instructions from your health care provider about how to take care of the puncture site. Make sure you: ? Wash your hands with soap and water before you change your bandage (dressing). If soap and water are not available, use hand sanitizer. ? Change your dressing as told by your health care provider.  Check your puncture siteevery day for signs of infection. Check for: ? More redness, swelling, or pain. ? More fluid or blood. ? Warmth. ? Pus or a bad smell.  Return to your normal activities as told by your health care provider. Ask your health care provider what activities are safe for you.  Do not drive for 24 hours if you were given a medicine to help you relax (sedative).  Keep all follow-up visits as told by your health care provider. This is important. Contact a health care provider if:  You have more redness, swelling, or pain around the puncture site.  You have more fluid or blood coming from the puncture site.  Your puncture site feels warm to the touch.  You have pus or a bad smell coming from the puncture site.  You have a fever.  Your pain is not controlled with medicine. This information is not intended to replace advice given to you by your health care provider. Make sure  you discuss any questions you have with your health care provider. Document Released: 07/02/2005 Document Revised: 07/02/2016 Document Reviewed: 05/26/2016 Elsevier Interactive Patient Education  2018 [employer].

## 2018-08-14 ENCOUNTER — Encounter (HOSPITAL_COMMUNITY): Payer: Self-pay | Admitting: Hematology and Oncology

## 2018-08-16 ENCOUNTER — Encounter: Payer: Self-pay | Admitting: Hematology and Oncology

## 2018-08-16 ENCOUNTER — Inpatient Hospital Stay (HOSPITAL_BASED_OUTPATIENT_CLINIC_OR_DEPARTMENT_OTHER): Payer: Medicare Other | Admitting: Hematology and Oncology

## 2018-08-16 ENCOUNTER — Telehealth: Payer: Self-pay | Admitting: Hematology and Oncology

## 2018-08-16 DIAGNOSIS — R5382 Chronic fatigue, unspecified: Secondary | ICD-10-CM | POA: Diagnosis not present

## 2018-08-16 DIAGNOSIS — D472 Monoclonal gammopathy: Secondary | ICD-10-CM | POA: Diagnosis not present

## 2018-08-16 DIAGNOSIS — G6181 Chronic inflammatory demyelinating polyneuritis: Secondary | ICD-10-CM

## 2018-08-16 DIAGNOSIS — Z79899 Other long term (current) drug therapy: Secondary | ICD-10-CM | POA: Diagnosis not present

## 2018-08-16 DIAGNOSIS — G4733 Obstructive sleep apnea (adult) (pediatric): Secondary | ICD-10-CM | POA: Diagnosis not present

## 2018-08-16 DIAGNOSIS — I2510 Atherosclerotic heart disease of native coronary artery without angina pectoris: Secondary | ICD-10-CM

## 2018-08-16 NOTE — Telephone Encounter (Signed)
Gave avs and calendar per patient request september

## 2018-08-16 NOTE — Assessment & Plan Note (Signed)
He complained of chronic fatigue His hemoglobin is normal He is wondering whether it could be due to related to Lyme disease that he had numerous tick bites recently The patient also have history of obstructive sleep apnea and cardiac disease I recommend consideration for cardiology evaluation or discussion with primary care doctor for further work-up to exclude active Lyme disease

## 2018-08-16 NOTE — Assessment & Plan Note (Signed)
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

## 2018-08-16 NOTE — Assessment & Plan Note (Signed)
We reviewed results of bone marrow biopsy No malignancy is detected There were no signs of clusters of plasma cells or anything to suggest amyloidosis For history of MGUS, we will continue once a year visit with history, physical examination and blood work

## 2018-08-16 NOTE — Progress Notes (Signed)
[facility] OFFICE PROGRESS NOTE  Patient Care Team: [name], MD as PCP - General (Family Medicine) [name], MD as PCP - Cardiology (Cardiology) Heath Lark, MD as Consulting Physician (Hematology and Oncology)  ASSESSMENT & PLAN:  MGUS (monoclonal gammopathy of unknown significance) We reviewed results of bone marrow biopsy No malignancy is detected There were no signs of clusters of plasma cells or anything to suggest amyloidosis For history of MGUS, we will continue once a year visit with history, physical examination and blood work  CIDP (chronic inflammatory demyelinating polyneuropathy) The cause of CIDP is unknown I recommend follow-up with neurologist to consider whether immunotherapy is appropriate  Chronic fatigue He complained of chronic fatigue His hemoglobin is normal He is wondering whether it could be due to related to Lyme disease that he had numerous tick bites recently The patient also have history of obstructive sleep apnea and cardiac disease I recommend consideration for cardiology evaluation or discussion with primary care doctor for further work-up to exclude active Lyme disease   No orders of the defined types were placed in this encounter.   INTERVAL HISTORY: Please see below for problem oriented charting. He returns to review bone marrow results He complained of mild soreness at the site of bone marrow biopsy but no signs of recent bleeding He continues to have chronic neuropathy and complain of significant fatigue  SUMMARY OF ONCOLOGIC HISTORY:  Derrick [name] was transferred to my care after his prior physician has left.  I reviewed the patient's records extensive and collaborated the history with the patient. 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 He was initially evaluated and treated by  a hematologist and a neurologist in [place] before he moved to [place]. 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 He had bone marrow aspirate and biopsy which showed no evidence of malignancy in his bone marrow. His neurologic condition is characterized primarily by painful distal neuropathy, feet worse than hands, and lower extremity weakness. At one point he could barely walk a block without having to stop due to pain and weakness. He was given an initial trial of Rituxan in 2010 with no significant improvement. However following reinitiation of Rituxan in April 2012 he did get a progressive improvement. He was put on a maintenance program which we have continued in this office on an every other month basis through 03/23/2013.  The patient have new onset atrial fibrillation. His echocardiogram shows significant dilated atrium without evidence of increase ventricular wall thickness. 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 Repeat bone marrow aspirate and biopsy on 08/04/2018 was negative for malignancy  REVIEW OF SYSTEMS:   Constitutional: Denies fevers, chills or abnormal weight loss Eyes: Denies blurriness of vision Ears, nose, mouth, throat, and face: Denies mucositis or sore throat Respiratory: Denies cough, dyspnea or wheezes Cardiovascular: Denies palpitation, chest discomfort or lower extremity swelling Gastrointestinal:  Denies nausea, heartburn or change in bowel habits Skin: Denies abnormal skin rashes Lymphatics: Denies new lymphadenopathy or easy bruising Neurological:Denies numbness, tingling or new weaknesses Behavioral/Psych: Mood is stable, no new changes  All other systems were reviewed with the patient and are negative.  I have reviewed the past medical history, past surgical history, social history and family history with the patient and they are unchanged from previous  note.  ALLERGIES:  has No Known Allergies.  MEDICATIONS:  Current Outpatient Medications  Medication Sig Dispense Refill  . albuterol (PROVENTIL HFA;VENTOLIN HFA) 108 (90 Base) MCG/ACT inhaler Inhale 2 puffs into the lungs every 6 (six) hours as needed for wheezing or shortness of breath. 1 Inhaler 1  . amLODipine (NORVASC) 10 MG tablet TAKE 1 TABLET BY MOUTH ONCE DAILY 90 tablet 3  . atorvastatin (LIPITOR) 20 MG tablet Take 1 tablet (20 mg total) by mouth daily. Please keep upcoming appt. Thank you 90 tablet 2  . bimatoprost (LUMIGAN) 0.01 % SOLN Place 1 drop into both eyes at bedtime.    . CHOLECALCIFEROL PO Take 1,000 mg by mouth daily. Vitamin D 3    . dorzolamide-timolol (COSOPT) 22.3-6.8 MG/ML ophthalmic solution Place 1 drop into both eyes 2 (two) times daily.     [name] doxycycline (VIBRA-TABS) 100 MG tablet Take 1 tablet (100 mg total) by mouth 2 (two) times daily. 20 tablet 0  . ELIQUIS 5 MG TABS tablet TAKE 1 TABLET BY MOUTH TWICE DAILY 180 tablet 2  . finasteride (PROSCAR) 5 MG tablet Take 5 mg by mouth daily.     . fluticasone (FLONASE) 50 MCG/ACT nasal spray Place 2 sprays into both nostrils 2 (two) times daily.    . Fluticasone Propionate, Inhal, (FLOVENT DISKUS) 100 MCG/BLIST AEPB Inhale 1 Inhaler into the lungs 2 (two) times daily. 60 each 1  . gabapentin (NEURONTIN) 800 MG tablet Take 0.5-1 tablets (400-800 mg total) by mouth 3 (three) times daily. 270 tablet 2  . MULTAQ 400 MG tablet TAKE 1 TABLET BY MOUTH TWICE DAILY WITH MEALS 180 tablet 3  . pantoprazole (PROTONIX) 40 MG tablet Take 1 tablet (40 mg total) by mouth daily. 90 tablet 2  . predniSONE (DELTASONE) 20 MG tablet Take 1 tablet (20 mg total) by mouth daily with breakfast. 5 tablet 0  . tadalafil (CIALIS) 5 MG tablet Take 5 mg by mouth daily. Reported on 05/26/2016    . triamterene-hydrochlorothiazide (MAXZIDE-25) 37.5-25 MG tablet TAKE 1 TABLET BY MOUTH ONCE DAILY 90 tablet 3   Current Facility-Administered Medications   Medication Dose Route Frequency Provider Last Rate Last Dose  . 0.9 %  sodium chloride infusion  500 mL Intravenous Continuous Pyrtle, Lajuan Lines, MD        PHYSICAL EXAMINATION: ECOG PERFORMANCE STATUS: 1 - Symptomatic but completely ambulatory  Vitals:   08/16/18 0812  BP: 130/68  Pulse: 72  Resp: 18  Temp: 97.8 F (36.6 C)  SpO2: 100%   Filed Weights   08/16/18 0812  Weight: 189 lb 8 oz (86 kg)    GENERAL:alert, no distress and comfortable NEURO: alert & oriented x 3 with fluent speech  LABORATORY DATA:  I have reviewed the data as listed    Component Value Date/Time   NA 141 07/27/2018 0746   NA 141 05/29/2018 0806   NA 141 07/27/2016 0748   K 4.2 07/27/2018 0746   K 3.9 07/27/2016 0748   CL 101 07/27/2018 0746   CL 104 03/23/2013 0855   CO2 31 07/27/2018 0746   CO2 30 (H) 07/27/2016 0748   GLUCOSE 96 07/27/2018 0746   GLUCOSE 122 07/27/2016 0748   GLUCOSE 98 03/23/2013 0855   BUN 15 07/27/2018 0746   BUN 14 05/29/2018 0806   BUN 16.0 07/27/2016 0748   CREATININE 1.02 07/27/2018 0746   CREATININE 1.0 07/27/2016 0748   CALCIUM 10.4 (H) 07/27/2018 0746   CALCIUM 10.5 (H) 07/27/2016  0748   PROT 7.4 07/27/2018 0746   PROT 6.7 05/29/2018 0806   PROT 7.5 07/27/2016 0748   ALBUMIN 4.2 07/27/2018 0746   ALBUMIN 4.4 05/29/2018 0806   ALBUMIN 4.3 07/27/2016 0748   AST 22 07/27/2018 0746   AST 24 07/27/2016 0748   ALT 22 07/27/2018 0746   ALT 19 07/27/2016 0748   ALKPHOS 58 07/27/2018 0746   ALKPHOS 53 07/27/2016 0748   BILITOT 0.7 07/27/2018 0746   BILITOT 0.7 05/29/2018 0806   BILITOT 0.84 07/27/2016 0748   GFRNONAA >60 07/27/2018 0746   GFRAA >60 07/27/2018 0746    No results found for: SPEP, UPEP  Lab Results  Component Value Date   WBC 5.8 08/04/2018   NEUTROABS 3.5 08/04/2018   HGB 14.1 08/04/2018   HCT 41.7 08/04/2018   MCV 92.3 08/04/2018   PLT 184 08/04/2018      Chemistry      Component Value Date/Time   NA 141 07/27/2018 0746   NA 141  05/29/2018 0806   NA 141 07/27/2016 0748   K 4.2 07/27/2018 0746   K 3.9 07/27/2016 0748   CL 101 07/27/2018 0746   CL 104 03/23/2013 0855   CO2 31 07/27/2018 0746   CO2 30 (H) 07/27/2016 0748   BUN 15 07/27/2018 0746   BUN 14 05/29/2018 0806   BUN 16.0 07/27/2016 0748   CREATININE 1.02 07/27/2018 0746   CREATININE 1.0 07/27/2016 0748      Component Value Date/Time   CALCIUM 10.4 (H) 07/27/2018 0746   CALCIUM 10.5 (H) 07/27/2016 0748   ALKPHOS 58 07/27/2018 0746   ALKPHOS 53 07/27/2016 0748   AST 22 07/27/2018 0746   AST 24 07/27/2016 0748   ALT 22 07/27/2018 0746   ALT 19 07/27/2016 0748   BILITOT 0.7 07/27/2018 0746   BILITOT 0.7 05/29/2018 0806   BILITOT 0.84 07/27/2016 0748       All questions were answered. The patient knows to call the clinic with any problems, questions or concerns. No barriers to learning was detected.  I spent 10 minutes counseling the patient face to face. The total time spent in the appointment was 15 minutes and more than 50% was on counseling and review of test results  Heath Lark, MD 08/16/2018 10:32 AM

## 2018-08-17 ENCOUNTER — Ambulatory Visit (INDEPENDENT_AMBULATORY_CARE_PROVIDER_SITE_OTHER): Payer: Medicare Other | Admitting: Family Medicine

## 2018-08-17 ENCOUNTER — Encounter: Payer: Self-pay | Admitting: Family Medicine

## 2018-08-17 VITALS — BP 120/76 | HR 75 | Temp 98.4°F | Ht 67.5 in | Wt 191.2 lb

## 2018-08-17 DIAGNOSIS — R5382 Chronic fatigue, unspecified: Secondary | ICD-10-CM

## 2018-08-17 DIAGNOSIS — I2510 Atherosclerotic heart disease of native coronary artery without angina pectoris: Secondary | ICD-10-CM

## 2018-08-17 LAB — TSH: TSH: 2.02 u[IU]/mL (ref 0.35–4.50)

## 2018-08-17 LAB — T3, FREE
T3, Free: 3 pg/mL (ref 2.3–4.2)
T3, Free: 3 pg/mL (ref 2.3–4.2)

## 2018-08-17 LAB — T4, FREE: Free T4: 0.89 ng/dL (ref 0.60–1.60)

## 2018-08-17 LAB — VITAMIN B12: Vitamin B-12: 595 pg/mL (ref 211–911)

## 2018-08-17 NOTE — Addendum Note (Signed)
Addended by: Carter Kitten on: 08/17/2018 10:51 AM   Modules accepted: Orders

## 2018-08-17 NOTE — Assessment & Plan Note (Signed)
Eval with EKG  Given cardiac history.  OSA appears well controlled on CPAP per pt.. Sleeping well.  Denies depression, anxiety.  Has weaned down gabapeentin.. May be having SE coming off such a high dose.  Npo new meds or supplements.   Eval B12, thyroid ( has some symtpoms) and tick borne illness eval given more tick bites in 07/2018.

## 2018-08-17 NOTE — Addendum Note (Signed)
Addended by: Ellamae Sia on: 08/17/2018 11:10 AM   Modules accepted: Orders

## 2018-08-17 NOTE — Progress Notes (Addendum)
   Subjective:    Patient ID: Derrick [name], male    DOB: 05/20/1941, 77 y.o.   MRN: [id number]  HPI   77 year old male pt with  AF, CIDP, CAD, MGUS, hx of colon cancer, sarcoid, OSA and paroxsysmal v tach presents with extreme fatigue in last 3-4 weeks, started 1-2 months and has been progressive.  He  is having trouble doing his regular walking... Feels weak al over.  No CP, NO SOB.  Slight burning in lungs when walking, but slight and may be from hot weather. No palpitations. Feels general malaise. Sleeping 9 hours a night. Sleeping well, CPAP fitting well. And napping. No normal for him.  Slight headache.  Has been titration off gabapentin down from 2400... Over last few month, decreasing every 2-3 weeks. Now down to 400 mg  Daily.  No new meds.   He is cold intolerant, mild new constipation in last week, has dry skin,  No hair loss.   He saw his oncologist yesterday 08/04/2018 cbc nml, multiple myeloma panel negative   2 month ago vit D nml. Lab Results  Component Value Date   HGBA1C 5.1 06/13/2018  Neg tick panel ( was treated anyway given on antibitoics before testing returned)   He has had multiple tick bites this year. 10 since 07/2018. Also treat for  URI in 07/28/2018 with doxycyline and prednisone.   Pacemaker  Working well per reports. Follows pulse with apple watch as well.. 60-62  9/25 EP  11/4  Blood pressure 120/76, pulse 75, temperature 98.4 F (36.9 C), temperature source Oral, height 5' 7.5" (1.715 m), weight 191 lb 4 oz (86.8 kg).  Review of Systems  Constitutional: Positive for fatigue. Negative for fever.  HENT: Negative for ear pain.   Eyes: Negative for pain.  Respiratory: Negative for cough and shortness of breath.   Cardiovascular: Negative for chest pain, palpitations and leg swelling.  Gastrointestinal: Negative for abdominal pain.  Genitourinary: Positive for hematuria. Negative for dysuria.  Musculoskeletal: Negative for arthralgias.  Neurological:  Positive for headaches. Negative for syncope and light-headedness.  Psychiatric/Behavioral: Negative for dysphoric mood and sleep disturbance. The patient is not nervous/anxious.        Objective:   Physical Exam  Constitutional: Vital signs are normal. He appears well-developed and well-nourished.  HENT:  Head: Normocephalic.  Right Ear: Hearing normal.  Left Ear: Hearing normal.  Nose: Nose normal.  Mouth/Throat: Oropharynx is clear and moist and mucous membranes are normal.  Neck: Trachea normal. Carotid bruit is not present. No thyroid mass and no thyromegaly present.  Cardiovascular: Normal rate, regular rhythm and normal pulses. Exam reveals no gallop, no distant heart sounds and no friction rub.  No murmur heard. No peripheral edema  Pulmonary/Chest: Effort normal and breath sounds normal. No respiratory distress.  Skin: Skin is warm, dry and intact. No rash noted.  Psychiatric: He has a normal mood and affect. His speech is normal and behavior is normal. Thought content normal.          Assessment & Plan:  EKG: unchanged from previous tracings. 

## 2018-08-17 NOTE — Patient Instructions (Signed)
Please stop at the lab to have labs drawn. If labs are  Unrevealing.. Call to make earlier follow up with cardiology for further heart eval.,

## 2018-08-21 LAB — B. BURGDORFI ANTIBODIES BY WB
B burgdorferi IgG Abs (IB): NEGATIVE
B burgdorferi IgM Abs (IB): NEGATIVE
Lyme Disease 18 kD IgG: REACTIVE — AB
Lyme Disease 23 kD IgG: NONREACTIVE
Lyme Disease 23 kD IgM: NONREACTIVE
Lyme Disease 28 kD IgG: NONREACTIVE
Lyme Disease 30 kD IgG: NONREACTIVE
Lyme Disease 39 kD IgG: NONREACTIVE
Lyme Disease 39 kD IgM: NONREACTIVE
Lyme Disease 41 kD IgG: NONREACTIVE
Lyme Disease 41 kD IgM: NONREACTIVE
Lyme Disease 45 kD IgG: NONREACTIVE
Lyme Disease 58 kD IgG: REACTIVE — AB
Lyme Disease 66 kD IgG: NONREACTIVE
Lyme Disease 93 kD IgG: NONREACTIVE

## 2018-08-21 LAB — EHRLICHIA ANTIBODY PANEL
E. CHAFFEENSIS AB IGG: <1:64 {titer}
E. CHAFFEENSIS AB IGM: <1:20 {titer}

## 2018-08-21 LAB — ROCKY MTN SPOTTED FVR ABS PNL(IGG+IGM)
RMSF IgG: NOT DETECTED
RMSF IgM: NOT DETECTED

## 2018-08-30 NOTE — Progress Notes (Signed)
I agree with the assessment and plan as directed by NP .The patient is known to me .   Paylin Hailu, MD  

## 2018-09-01 ENCOUNTER — Ambulatory Visit: Payer: Medicare Other | Admitting: Nurse Practitioner

## 2018-09-04 DIAGNOSIS — Z23 Encounter for immunization: Secondary | ICD-10-CM | POA: Diagnosis not present

## 2018-09-04 NOTE — Assessment & Plan Note (Signed)
well controlled on protonix

## 2018-09-04 NOTE — Assessment & Plan Note (Signed)
Good control last check on lipitor but LDL not < 70.Faythe Ghee per Cards Dr. York Cerise noted.

## 2018-09-04 NOTE — Assessment & Plan Note (Signed)
Likely in part due to med SE. Weaning off neurontin as able.

## 2018-09-04 NOTE — Assessment & Plan Note (Signed)
Rate controlled.. Followed by Cardiology. He is anticoagulated with Eliquis.

## 2018-09-04 NOTE — Assessment & Plan Note (Signed)
Well controlled. Continue current medication.  

## 2018-09-04 NOTE — Assessment & Plan Note (Signed)
Trying to wean of neuropathy given SE.

## 2018-09-04 NOTE — Assessment & Plan Note (Signed)
Well controlled with diet. 

## 2018-09-04 NOTE — Assessment & Plan Note (Signed)
Followed by neuro 

## 2018-09-04 NOTE — Assessment & Plan Note (Signed)
Followed by rheum Dr. Marijean Bravo and Dr. Fredna Dow for hand symtpoms.

## 2018-09-05 DIAGNOSIS — M2550 Pain in unspecified joint: Secondary | ICD-10-CM | POA: Diagnosis not present

## 2018-09-05 DIAGNOSIS — Z6828 Body mass index (BMI) 28.0-28.9, adult: Secondary | ICD-10-CM | POA: Diagnosis not present

## 2018-09-05 DIAGNOSIS — M5489 Other dorsalgia: Secondary | ICD-10-CM | POA: Diagnosis not present

## 2018-09-05 DIAGNOSIS — M25561 Pain in right knee: Secondary | ICD-10-CM | POA: Diagnosis not present

## 2018-09-05 DIAGNOSIS — M150 Primary generalized (osteo)arthritis: Secondary | ICD-10-CM | POA: Diagnosis not present

## 2018-09-05 DIAGNOSIS — E663 Overweight: Secondary | ICD-10-CM | POA: Diagnosis not present

## 2018-09-19 NOTE — Progress Notes (Signed)
Electrophysiology Office Note   Date:  09/20/2018   ID:  Derrick [name], DOB 12-Feb-1941, MRN [id number]  PCP:  [name], MD  Cardiologist:  Burt Knack Primary Electrophysiologist:  Derrick [name] Derrick Leeds, MD    No chief complaint on file.    History of Present Illness: Derrick [name] is a 77 y.o. male who presents today for electrophysiology evaluation.   He has been followed for coronary artery disease. He has a history of myocardial infarction and drug-eluting stent placement in the right coronary artery. 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 He's been managed medically. He has multiple noncardiac problems including CIDP, pulmonary sarcoid, and monoclonal gammopathy of undetermined significance. He last underwent cardiac catheterization in April 2016 showing stable coronary anatomy. He had a pacemaker implanted for tachybradycardia syndrome on 05/12/16. He has been maintained on Multaq since that time with maintenance of sinus rhythm.  Today, denies symptoms of palpitations, chest pain, shortness of breath, orthopnea, PND, lower extremity edema, claudication, dizziness, presyncope, syncope, bleeding, or neurologic sequela. The patient is tolerating medications without difficulties.  He is noted no further episodes of atrial fibrillation.  Unfortunately he is quite fatigued and short of breath.  He is having to take naps multiple times throughout the day.  He gets short of breath when he does minimal exertion.   Past Medical History:  Diagnosis Date  . BENIGN PROSTATIC HYPERTROPHY, WITH OBSTRUCTION 05/28/2010  . CAD (coronary artery disease)    a. s/p NSTEMI 06/01/11: DES to RCA;  b. cath 06/25/11:   dLM 50-60% (FFR 0.87), prox to mid LAD 40-50%, D1 50%, pCFX 50%, RCA stent ok, dPDA 80%, EF 55-60%.  His FFR was felt to be negative and therefore medical therapy was recommended ;  echo 6/12: EF 55-60%, mild AS   . Chronic back pain     . CIDP (chronic inflammatory demyelinating polyneuropathy) ([place]) 04/11/2012  . Colon cancer ([place]) dx'd 2000   "left"  . COLONIC POLYPS, ADENOMATOUS, HX OF 05/28/2010  . Complication of anesthesia    "stopped breathing; related to my sleep apnea"   . COPD (chronic obstructive pulmonary disease) ([place])    "associated w/lung sarcoidosis"  . 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 DISH (diffuse idiopathic skeletal hyperostosis) 12/02/2015  . Diverticulitis   . GENERALIZED OSTEOARTHROSIS UNSPECIFIED SITE 05/28/2010  . GERD 05/28/2010  . Heart murmur   . History of gout   . HYPERLIPIDEMIA 05/28/2010  . Hypertension   . IgM lambda paraproteinemia   . 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 Malignant neoplasm of descending colon ([place]) 05/28/2010  . MITRAL VALVE PROLAPSE 05/28/2010  . 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 NSTEMI (non-ST elevated myocardial infarction) ([place]) 06/03/11  . 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  . OSA on CPAP 05/28/2010  . PALPITATIONS, CHRONIC 05/28/2010  . Parotid gland pain 2010   infection  . Peripheral neuropathy    "tx'd w/targeted chemo" (05/12/2016)  . Pneumonia 2-3 times  . Presence of permanent cardiac pacemaker   . 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 Sarcoidosis    pulmonalis  . UNSPECIFIED INFLAMMATORY AND TOXIC NEUROPATHY 05/28/2010  . Vision loss   . VITAMIN D DEFICIENCY 05/28/2010   Past Surgical History:  Procedure Laterality Date  . APPENDECTOMY  1954  . CATARACT EXTRACTION W/ INTRAOCULAR LENS IMPLANT Left   . COLON SURGERY  2000   decending colon   . CORONARY ANGIOPLASTY WITH  STENT PLACEMENT  06/03/11  . ELECTROPHYSIOLOGIC STUDY N/A 05/12/2016   Procedure: Cardioversion;  Surgeon: Ghislaine Harcum Derrick Leeds, MD;  Location: [facility];  Service: Cardiovascular;  Laterality: N/A;  . EP IMPLANTABLE DEVICE N/A 05/12/2016   Procedure: Pacemaker Implant;  Surgeon: Pura Picinich Derrick Leeds, MD;  Location:  [facility];  Service: Cardiovascular;  Laterality: N/A;  . INSERT / REPLACE / REMOVE PACEMAKER  05/12/2016  . KNEE ARTHROSCOPY Right 2003  . LEFT HEART CATHETERIZATION WITH CORONARY ANGIOGRAM N/A 03/31/2015   Procedure: LEFT HEART CATHETERIZATION WITH CORONARY ANGIOGRAM;  Surgeon: [name], MD;  Location: [facility];  Service: Cardiovascular;  Laterality: N/A;  . LUNG SURGERY  2001   "open lung dissection"  . MOHS SURGERY Left ~ 2008   ear  . PROSTATE BIOPSY  2010  . TONSILLECTOMY  1950     Current Outpatient Medications  Medication Sig Dispense Refill  . albuterol (PROVENTIL HFA;VENTOLIN HFA) 108 (90 Base) MCG/ACT inhaler Inhale 2 puffs into the lungs every 6 (six) hours as needed for wheezing or shortness of breath. 1 Inhaler 1  . amLODipine (NORVASC) 10 MG tablet TAKE 1 TABLET BY MOUTH ONCE DAILY 90 tablet 3  . atorvastatin (LIPITOR) 20 MG tablet Take 1 tablet (20 mg total) by mouth daily. Please keep upcoming appt. Thank you 90 tablet 2  . bimatoprost (LUMIGAN) 0.01 % SOLN Place 1 drop into both eyes at bedtime.    . CHOLECALCIFEROL PO Take 1,000 mg by mouth daily. Vitamin D 3    . dorzolamide-timolol (COSOPT) 22.3-6.8 MG/ML ophthalmic solution Place 1 drop into both eyes 2 (two) times daily.     [name] ELIQUIS 5 MG TABS tablet TAKE 1 TABLET BY MOUTH TWICE DAILY 180 tablet 2  . finasteride (PROSCAR) 5 MG tablet Take 5 mg by mouth daily.     . fluticasone (FLONASE) 50 MCG/ACT nasal spray Place 2 sprays into both nostrils 2 (two) times daily.    . Fluticasone Propionate, Inhal, (FLOVENT DISKUS) 100 MCG/BLIST AEPB Inhale 1 Inhaler into the lungs 2 (two) times daily. 60 each 1  . MULTAQ 400 MG tablet TAKE 1 TABLET BY MOUTH TWICE DAILY WITH MEALS 180 tablet 3  . pantoprazole (PROTONIX) 40 MG tablet Take 1 tablet (40 mg total) by mouth daily. 90 tablet 2  . tadalafil (CIALIS) 5 MG tablet Take 5 mg by mouth daily. Reported on 05/26/2016    . triamterene-hydrochlorothiazide  (MAXZIDE-25) 37.5-25 MG tablet TAKE 1 TABLET BY MOUTH ONCE DAILY 90 tablet 3   Current Facility-Administered Medications  Medication Dose Route Frequency Provider Last Rate Last Dose  . 0.9 %  sodium chloride infusion  500 mL Intravenous Continuous Pyrtle, Lajuan Lines, MD        Allergies:   Patient has no known allergies.   Social History:  The patient  reports that he has never smoked. He has never used smokeless tobacco. He reports that he drinks about 14.0 standard drinks of alcohol per week. He reports that he does not use drugs.   Family History:  The patient's family history includes Arrhythmia (age of onset: 28) in his brother; Arthritis in his mother; Cancer in his brother; Colon polyps in his mother; Congestive Heart Failure in his brother; Coronary artery disease in his brother and mother; Heart attack in his brother; Heart disease in his mother.   ROS:  Please see the history of present illness.   Otherwise, review of systems is positive for weight change, appetite  change, fatigue, chest pain, back pain, balance problems, easy bruising.   All other systems are reviewed and negative.   PHYSICAL EXAM: VS:  BP 126/70   Pulse 73   Ht 5' 7.5" (1.715 m)   Wt 186 lb (84.4 kg)   BMI 28.70 kg/m  , BMI Body mass index is 28.7 kg/m. GEN: Well nourished, well developed, in no acute distress  HEENT: normal  Neck: no JVD, carotid bruits, or masses Cardiac: RRR; no murmurs, rubs, or gallops,no edema  Respiratory:  clear to auscultation bilaterally, normal work of breathing GI: soft, nontender, nondistended, + BS MS: no deformity or atrophy  Skin: warm and dry, device site well healed Neuro:  Strength and sensation are intact Psych: euthymic mood, full affect  EKG:  EKG is ordered today. Personal review of the ekg ordered shows AV paced  Personal review of the device interrogation today. Results in [place]: 07/27/2018: ALT 22; BUN 15; Creatinine, Ser 1.02; Potassium 4.2;  Sodium 141 08/04/2018: Hemoglobin 14.1; Platelets 184 08/17/2018: TSH 2.02    Lipid Panel     Component Value Date/Time   CHOL 154 05/29/2018 0806   TRIG 52 05/29/2018 0806   HDL 60 05/29/2018 0806   CHOLHDL 2.6 05/29/2018 0806   CHOLHDL 2.7 03/19/2016 0922   VLDL 14 03/19/2016 0922   LDLCALC 84 05/29/2018 0806     Wt Readings from Last 3 Encounters:  09/20/18 186 lb (84.4 kg)  08/17/18 191 lb 4 oz (86.8 kg)  08/16/18 189 lb 8 oz (86 kg)      Other studies Reviewed: Additional studies/ records that were reviewed today include: 03/31/15 Cath, 04/13/16 TTE, 03/31/16 Holter Review of the above records today demonstrates:   Final Conclusions:  1. Moderate distal left mainstem disease, without significant progression from the patient's 2012 cardiac catheterization study 2. Mild to moderate diffusely calcified stenosis of the LAD 3. Mild nonobstructive stenosis of the left circumflex 4. Continued stent patency of the RCA with moderate PDA stenosis 5. Preserved LV systolic function  - Left ventricle: The cavity size was normal. Wall thickness was  normal. Systolic function was vigorous. The estimated ejection  fraction was in the range of 65% to 70%. Wall motion was normal;  there were no regional wall motion abnormalities. The study is  not technically sufficient to allow evaluation of LV diastolic  function. - Aortic valve: Trileaflet; mildly calcified leaflets. Mild  stenosis. Mean gradient (S): 12 mm Hg. Peak gradient (S): 22 mm  Hg. Valve area (VTI): 2.58 cm^2. Valve area (Vmax): 2.92 cm^2.  Valve area (Vmean): 2.91 cm^2. - Aorta: Ascending aortic diameter: 41 mm (S). - Ascending aorta: The ascending aorta is dilated. - Mitral valve: Mildly thickened leaflets . There was mild  regurgitation. - Left atrium: Severely dilated at 57 ml/m2. - Right ventricle: The cavity size was mildly dilated. - Right atrium: Severely dilated at 27 cm2. - Tricuspid valve: There was  mild regurgitation. - Pulmonary arteries: PA peak pressure: 35 mm Hg (S). - Inferior vena cava: The vessel was dilated. The respirophasic  diameter changes were blunted (< 50%), consistent with elevated  central venous pressure.   The basic rhythm is atrial fibrillation  There are aberrated beats or PVC's and a short run of wide-complex tachycardia, suspect AF with aberrancy  There are episodes of slow atrial fibrillation   ASSESSMENT AND PLAN:  1.  Paroxysmal atrial fibrillation: On Eliquis and Multitak.  He remains in sinus rhythm.  He is currently AV pacing.  Has had minimal atrial fibrillation since starting Multitak.  No changes at this time.  This patients CHA2DS2-VASc Score and unadjusted Ischemic Stroke Rate (% per year) is equal to 3.2 % stroke rate/year from a score of 3  Above score calculated as 1 point each if present [CHF, HTN, DM, Vascular=MI/PAD/Aortic Plaque, Age if 65-74, or Male] Above score calculated as 2 points each if present [Age > 75, or Stroke/TIA/TE]   2. Hypertension: Well-controlled.  No changes.  3. Hyperlipidemia: Continue statin  4. Coronary artery disease with chronic stable angina: Currently without chest pain.  He does get occasional chest pain that is stable.  No changes.  5. Sick sinus syndrome: Medtronic dual-chamber pacemaker implanted 05/12/2016.  Device functioning appropriately.  Fortunately he is having quite a bit of fatigue and shortness of breath.  This could be due to a pacemaker induced cardiomyopathy.  We Amiir Heckard plan for echocardiogram.    Current medicines are reviewed at length with the patient today.   The patient does not have concerns regarding his medicines.  The following changes were made today: None  Labs/ tests ordered today include:  Orders Placed This Encounter  Procedures  . EKG 12-Lead  . ECHOCARDIOGRAM COMPLETE     Disposition:   FU with Derrick [name] 6 months  Signed, Derrick [name] Derrick Leeds, MD  09/20/2018  8:44 AM     [facility] [address] [place] [address] [phone] (office) [phone] (fax)

## 2018-09-20 ENCOUNTER — Ambulatory Visit (INDEPENDENT_AMBULATORY_CARE_PROVIDER_SITE_OTHER): Payer: Medicare Other | Admitting: Cardiology

## 2018-09-20 ENCOUNTER — Encounter: Payer: Self-pay | Admitting: Cardiology

## 2018-09-20 VITALS — BP 126/70 | HR 73 | Ht 67.5 in | Wt 186.0 lb

## 2018-09-20 DIAGNOSIS — E785 Hyperlipidemia, unspecified: Secondary | ICD-10-CM | POA: Diagnosis not present

## 2018-09-20 DIAGNOSIS — Z950 Presence of cardiac pacemaker: Secondary | ICD-10-CM | POA: Diagnosis not present

## 2018-09-20 DIAGNOSIS — I480 Paroxysmal atrial fibrillation: Secondary | ICD-10-CM | POA: Diagnosis not present

## 2018-09-20 DIAGNOSIS — R5383 Other fatigue: Secondary | ICD-10-CM

## 2018-09-20 DIAGNOSIS — I25118 Atherosclerotic heart disease of native coronary artery with other forms of angina pectoris: Secondary | ICD-10-CM | POA: Diagnosis not present

## 2018-09-20 DIAGNOSIS — R0602 Shortness of breath: Secondary | ICD-10-CM | POA: Diagnosis not present

## 2018-09-20 DIAGNOSIS — I442 Atrioventricular block, complete: Secondary | ICD-10-CM

## 2018-09-20 DIAGNOSIS — I495 Sick sinus syndrome: Secondary | ICD-10-CM | POA: Diagnosis not present

## 2018-09-20 DIAGNOSIS — I10 Essential (primary) hypertension: Secondary | ICD-10-CM | POA: Diagnosis not present

## 2018-09-20 DIAGNOSIS — I2510 Atherosclerotic heart disease of native coronary artery without angina pectoris: Secondary | ICD-10-CM

## 2018-09-20 LAB — CUP PACEART INCLINIC DEVICE CHECK
Battery Remaining Longevity: 77 mo
Battery Voltage: 3.01 V
Brady Statistic AP VP Percent: 97.87 %
Brady Statistic AP VS Percent: 0.02 %
Brady Statistic AS VP Percent: 2.11 %
Brady Statistic AS VS Percent: 0 %
Brady Statistic RA Percent Paced: 97.81 %
Brady Statistic RV Percent Paced: 99.81 %
Date Time Interrogation Session: 20190925102256
Implantable Lead Implant Date: 20170517
Implantable Lead Implant Date: 20170517
Implantable Lead Location: 753859
Implantable Lead Location: 753860
Implantable Lead Model: 5076
Implantable Lead Model: 5076
Implantable Pulse Generator Implant Date: 20170517
Lead Channel Impedance Value: 399 Ohm
Lead Channel Impedance Value: 456 Ohm
Lead Channel Impedance Value: 513 Ohm
Lead Channel Impedance Value: 608 Ohm
Lead Channel Pacing Threshold Amplitude: 0.75 V
Lead Channel Pacing Threshold Amplitude: 0.75 V
Lead Channel Pacing Threshold Pulse Width: 0.4 ms
Lead Channel Pacing Threshold Pulse Width: 0.4 ms
Lead Channel Sensing Intrinsic Amplitude: 10.75 mV
Lead Channel Sensing Intrinsic Amplitude: 2.75 mV
Lead Channel Setting Pacing Amplitude: 2 V
Lead Channel Setting Pacing Amplitude: 2.5 V
Lead Channel Setting Pacing Pulse Width: 0.4 ms
Lead Channel Setting Sensing Sensitivity: 2.8 mV

## 2018-09-20 NOTE — Patient Instructions (Addendum)
Medication Instructions:  Your physician recommends that you continue on your current medications as directed. Please refer to the Current Medication list given to you today.  *If you need a refill on your cardiac medications before your next appointment, please call your pharmacy*  Labwork: None ordered  Testing/Procedures: Your physician has requested that you have an echocardiogram. Echocardiography is a painless test that uses sound waves to create images of your heart. It provides your doctor with information about the size and shape of your heart and how well your heart's chambers and valves are working. This procedure takes approximately one hour. There are no restrictions for this procedure.  Follow-Up: Remote monitoring is used to monitor your Pacemaker from home. This monitoring reduces the number of office visits required to check your device to one time per year. It allows [place] to keep an eye on the functioning of your device to ensure it is working properly. You are scheduled for a device check from home on 09/25/2018. You may send your transmission at any time that day. If you have a wireless device, the transmission will be sent automatically. After your physician reviews your transmission, you will receive a postcard with your next transmission date.  Your physician wants you to follow-up in: 6 months with Dr. Curt Bears.  You will receive a reminder letter in the mail two months in advance. If you don't receive a letter, please call our office to schedule the follow-up appointment.  Thank you for choosing [facility]!!   Trinidad Curet, RN [phone]

## 2018-09-25 ENCOUNTER — Ambulatory Visit (INDEPENDENT_AMBULATORY_CARE_PROVIDER_SITE_OTHER): Payer: Medicare Other | Admitting: *Deleted

## 2018-09-25 DIAGNOSIS — I442 Atrioventricular block, complete: Secondary | ICD-10-CM | POA: Diagnosis not present

## 2018-09-25 NOTE — Progress Notes (Signed)
Remote pacemaker transmission.   

## 2018-09-27 ENCOUNTER — Other Ambulatory Visit: Payer: Self-pay

## 2018-09-27 ENCOUNTER — Ambulatory Visit (HOSPITAL_COMMUNITY): Payer: Medicare Other | Attending: Cardiology

## 2018-09-27 DIAGNOSIS — R5383 Other fatigue: Secondary | ICD-10-CM | POA: Insufficient documentation

## 2018-09-27 DIAGNOSIS — I252 Old myocardial infarction: Secondary | ICD-10-CM | POA: Insufficient documentation

## 2018-09-27 DIAGNOSIS — E785 Hyperlipidemia, unspecified: Secondary | ICD-10-CM | POA: Insufficient documentation

## 2018-09-27 DIAGNOSIS — R0602 Shortness of breath: Secondary | ICD-10-CM | POA: Diagnosis not present

## 2018-09-27 DIAGNOSIS — I119 Hypertensive heart disease without heart failure: Secondary | ICD-10-CM | POA: Diagnosis not present

## 2018-09-27 DIAGNOSIS — I25118 Atherosclerotic heart disease of native coronary artery with other forms of angina pectoris: Secondary | ICD-10-CM | POA: Insufficient documentation

## 2018-09-27 DIAGNOSIS — I77810 Thoracic aortic ectasia: Secondary | ICD-10-CM | POA: Insufficient documentation

## 2018-09-27 DIAGNOSIS — I080 Rheumatic disorders of both mitral and aortic valves: Secondary | ICD-10-CM | POA: Insufficient documentation

## 2018-09-27 LAB — CUP PACEART REMOTE DEVICE CHECK
Battery Remaining Longevity: 77 mo
Battery Voltage: 3.01 V
Brady Statistic AP VP Percent: 96.3 %
Brady Statistic AP VS Percent: 0.01 %
Brady Statistic AS VP Percent: 3.68 %
Brady Statistic AS VS Percent: 0.01 %
Brady Statistic RA Percent Paced: 96.16 %
Brady Statistic RV Percent Paced: 99.4 %
Date Time Interrogation Session: 20190930142210
Implantable Lead Implant Date: 20170517
Implantable Lead Implant Date: 20170517
Implantable Lead Location: 753859
Implantable Lead Location: 753860
Implantable Lead Model: 5076
Implantable Lead Model: 5076
Implantable Pulse Generator Implant Date: 20170517
Lead Channel Impedance Value: 399 Ohm
Lead Channel Impedance Value: 437 Ohm
Lead Channel Impedance Value: 513 Ohm
Lead Channel Impedance Value: 627 Ohm
Lead Channel Pacing Threshold Amplitude: 0.625 V
Lead Channel Pacing Threshold Amplitude: 0.875 V
Lead Channel Pacing Threshold Pulse Width: 0.4 ms
Lead Channel Pacing Threshold Pulse Width: 0.4 ms
Lead Channel Sensing Intrinsic Amplitude: 11.125 mV
Lead Channel Sensing Intrinsic Amplitude: 11.125 mV
Lead Channel Sensing Intrinsic Amplitude: 2.625 mV
Lead Channel Sensing Intrinsic Amplitude: 2.625 mV
Lead Channel Setting Pacing Amplitude: 2 V
Lead Channel Setting Pacing Amplitude: 2.5 V
Lead Channel Setting Pacing Pulse Width: 0.4 ms
Lead Channel Setting Sensing Sensitivity: 2.8 mV

## 2018-10-25 DIAGNOSIS — H2511 Age-related nuclear cataract, right eye: Secondary | ICD-10-CM | POA: Diagnosis not present

## 2018-10-25 DIAGNOSIS — H401122 Primary open-angle glaucoma, left eye, moderate stage: Secondary | ICD-10-CM | POA: Diagnosis not present

## 2018-10-25 DIAGNOSIS — H401112 Primary open-angle glaucoma, right eye, moderate stage: Secondary | ICD-10-CM | POA: Diagnosis not present

## 2018-10-25 DIAGNOSIS — H524 Presbyopia: Secondary | ICD-10-CM | POA: Diagnosis not present

## 2018-10-27 DIAGNOSIS — N401 Enlarged prostate with lower urinary tract symptoms: Secondary | ICD-10-CM | POA: Diagnosis not present

## 2018-10-30 ENCOUNTER — Encounter: Payer: Self-pay | Admitting: Cardiovascular Disease

## 2018-10-30 ENCOUNTER — Ambulatory Visit (INDEPENDENT_AMBULATORY_CARE_PROVIDER_SITE_OTHER): Payer: Medicare Other | Admitting: Cardiovascular Disease

## 2018-10-30 VITALS — BP 132/68 | HR 66 | Ht 67.5 in | Wt 185.4 lb

## 2018-10-30 DIAGNOSIS — E782 Mixed hyperlipidemia: Secondary | ICD-10-CM

## 2018-10-30 DIAGNOSIS — I480 Paroxysmal atrial fibrillation: Secondary | ICD-10-CM

## 2018-10-30 DIAGNOSIS — I350 Nonrheumatic aortic (valve) stenosis: Secondary | ICD-10-CM

## 2018-10-30 DIAGNOSIS — I10 Essential (primary) hypertension: Secondary | ICD-10-CM

## 2018-10-30 DIAGNOSIS — I2510 Atherosclerotic heart disease of native coronary artery without angina pectoris: Secondary | ICD-10-CM | POA: Diagnosis not present

## 2018-10-30 NOTE — Progress Notes (Signed)
Cardiology Office Note:    Date:  11/01/2018   ID:  Derrick [name], DOB 1941/03/05, MRN [id number]  PCP:  [name], MD  Cardiologist:  [name], MD  Electrophysiologist:  [name], MD   Referring MD: [name], MD   Chief Complaint  Patient presents with  . Coronary Artery Disease    History of Present Illness:    Derrick [name] is a 77 y.o. male with a hx of coronary artery disease, presenting for follow-up evaluation.  Patient initially presented with an inferior wall MI and underwent primary PCI using a drug-eluting stent in the right coronary artery.  He was noted to have moderate left mainstem disease which was later interrogated by pressure wire analysis without significant ischemia.  The patient has had no recurrence of anginal symptoms.  He underwent permanent pacemaker placement in 2017 for paroxysmal atrial fibrillation with bradycardic episodes.  He is now managed with dronedarone and apixaban.    The patient is here alone today.  He complains of generalized fatigue.  He is having no discomfort in his chest or upper back.  He is walking for exercise with no exertional symptoms.  He denies shortness of breath, orthopnea, PND, or heart palpitations.  Past Medical History:  Diagnosis Date  . BENIGN PROSTATIC HYPERTROPHY, WITH OBSTRUCTION 05/28/2010  . CAD (coronary artery disease)    a. s/p NSTEMI 06/01/11: DES to RCA;  b. cath 06/25/11:   dLM 50-60% (FFR 0.87), prox to mid LAD 40-50%, D1 50%, pCFX 50%, RCA stent ok, dPDA 80%, EF 55-60%.  His FFR was felt to be negative and therefore medical therapy was recommended ;  echo 6/12: EF 55-60%, mild AS   . Chronic back pain   . CIDP (chronic inflammatory demyelinating polyneuropathy) ([place]) 04/11/2012  . Colon cancer ([place]) dx'd 2000   "left"  . COLONIC POLYPS, ADENOMATOUS, HX OF 05/28/2010  . Complication of anesthesia    "stopped breathing; related to my sleep apnea"   . COPD (chronic obstructive pulmonary disease)  ([place])    "associated w/lung sarcoidosis"  . 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 DISH (diffuse idiopathic skeletal hyperostosis) 12/02/2015  . Diverticulitis   . GENERALIZED OSTEOARTHROSIS UNSPECIFIED SITE 05/28/2010  . GERD 05/28/2010  . Heart murmur   . History of gout   . HYPERLIPIDEMIA 05/28/2010  . Hypertension   . IgM lambda paraproteinemia   . 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 Malignant neoplasm of descending colon ([place]) 05/28/2010  . MITRAL VALVE PROLAPSE 05/28/2010  . 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 NSTEMI (non-ST elevated myocardial infarction) ([place]) 06/03/11  . 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  . OSA on CPAP 05/28/2010  . PALPITATIONS, CHRONIC 05/28/2010  . Parotid gland pain 2010   infection  . Peripheral neuropathy    "tx'd w/targeted chemo" (05/12/2016)  . Pneumonia 2-3 times  . Presence of permanent cardiac pacemaker   . 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 Sarcoidosis    pulmonalis  . UNSPECIFIED INFLAMMATORY AND TOXIC NEUROPATHY 05/28/2010  . Vision loss   . VITAMIN D DEFICIENCY 05/28/2010    Past Surgical History:  Procedure Laterality Date  . APPENDECTOMY  1954  . CATARACT EXTRACTION W/ INTRAOCULAR LENS IMPLANT Left   . COLON SURGERY  2000   decending colon   . CORONARY ANGIOPLASTY WITH STENT PLACEMENT  06/03/11  . ELECTROPHYSIOLOGIC STUDY N/A 05/12/2016   Procedure: Cardioversion;  Surgeon: [name], MD;  Location:  [facility];  Service: Cardiovascular;  Laterality: N/A;  . EP IMPLANTABLE DEVICE N/A 05/12/2016   Procedure: Pacemaker Implant;  Surgeon: [name], MD;  Location: [facility];  Service: Cardiovascular;  Laterality: N/A;  . INSERT / REPLACE / REMOVE PACEMAKER  05/12/2016  . KNEE ARTHROSCOPY Right 2003  . LEFT HEART CATHETERIZATION WITH CORONARY ANGIOGRAM N/A 03/31/2015   Procedure: LEFT HEART CATHETERIZATION WITH CORONARY ANGIOGRAM;  Surgeon: [name], MD;  Location: [facility] CATH LAB;  Service: Cardiovascular;  Laterality: N/A;  . LUNG SURGERY  2001   "open lung dissection"  . MOHS SURGERY Left ~ 2008   ear  . PROSTATE BIOPSY  2010  . TONSILLECTOMY  1950    Current Medications: No outpatient medications have been marked as taking for the 10/30/18 encounter (Office Visit) with [name], MD.   Current Facility-Administered Medications for the 10/30/18 encounter (Office Visit) with [name], MD  Medication  . 0.9 %  sodium chloride infusion     Allergies:   Patient has no known allergies.   Social History   Socioeconomic History  . Marital status: Married    Spouse name: Not on file  . Number of children: 2  . Years of education: MS-Coll.  . Highest education level: Not on file  Occupational History  . Occupation: Optometrist    Comment: Building services engineer  Social Needs  . Financial resource strain: Not on file  . Food insecurity:    Worry: Not on file    Inability: Not on file  . Transportation needs:    Medical: Not on file    Non-medical: Not on file  Tobacco Use  . Smoking status: Never Smoker  . Smokeless tobacco: Never Used  Substance and Sexual Activity  . Alcohol use: Yes    Alcohol/week: 14.0 standard drinks    Types: 14 Glasses of wine per week    Comment: 05/12/2016 "10oz wine qd"  . Drug use: No  . Sexual activity: Yes    Birth control/protection: None  Lifestyle  . Physical activity:    Days per week: Not on file    Minutes per session: Not on file  . Stress: Not on file  Relationships  . Social connections:    Talks on phone: Not on file    Gets together: Not on file    Attends religious service: Not on file    Active member of club or organization: Not on file    Attends meetings of clubs or organizations: Not on file    Relationship status: Not on file  Other Topics Concern  . Not on file  Social History Narrative   College- [employer]; [employer]-environment science and  mgt. Married [name])  "102" 1 son- 95; 1 dtr "54" 2 grandchildren. 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 End of life-provided discussive context and provided packet.     Family History: The patient's family history includes Arrhythmia (age of onset: 61) in his brother; Arthritis in his mother; Cancer in his brother; Colon polyps in his mother; Congestive Heart Failure in his brother; Coronary artery disease in his brother and mother; Heart attack in his brother; Heart disease in his mother.  ROS:   Please see the history of present illness.    All other systems reviewed and are negative.  EKGs/Labs/Other Studies Reviewed:    The following studies were reviewed today: Echo 09-27-2018: Left ventricle:  The cavity size was normal. Wall thickness was increased in a  pattern of mild LVH. Systolic function was normal. The estimated ejection fraction was in the range of 50% to 55%. Regional wall motion abnormalities:   There is hypokinesis of the apical myocardium. Doppler parameters are consistent with abnormal left ventricular relaxation (grade 1 diastolic dysfunction).  ------------------------------------------------------------------- Aortic valve:   Trileaflet; severely calcified leaflets. Valve mobility was restricted.  Doppler:   There was mild stenosis. There was no regurgitation.    VTI ratio of LVOT to aortic valve: 0.43. Valve area (VTI): 1.34 cm^2. Indexed valve area (VTI): 0.66 cm^2/m^2. Peak velocity ratio of LVOT to aortic valve: 0.45. Valve area (Vmax): 1.41 cm^2. Indexed valve area (Vmax): 0.7 cm^2/m^2. Mean velocity ratio of LVOT to aortic valve: 0.43. Valve area (Vmean): 1.34 cm^2. Indexed valve area (Vmean): 0.66 cm^2/m^2. Mean gradient (S): 14 mm Hg. Peak gradient (S): 26 mm Hg.  ------------------------------------------------------------------- Aorta:  Aortic root: The aortic root was normal in size. Ascending aorta: The ascending aorta was mildly  dilated.  ------------------------------------------------------------------- Mitral valve:   Calcified annulus. Mobility was not restricted. Doppler:  Transvalvular velocity was within the normal range. There was no evidence for stenosis. There was trivial regurgitation. Valve area by pressure half-time: 2.68 cm^2. Indexed valve area by pressure half-time: 1.32 cm^2/m^2.    Peak gradient (D): 3 mm Hg.   ------------------------------------------------------------------- Left atrium:  The atrium was mildly dilated.  ------------------------------------------------------------------- Right ventricle:  The cavity size was normal. Pacer wire or catheter noted in right ventricle. Systolic function was normal.   ------------------------------------------------------------------- Pulmonic valve:    Doppler:  Transvalvular velocity was within the normal range. There was no evidence for stenosis.  ------------------------------------------------------------------- Tricuspid valve:   Structurally normal valve.    Doppler: Transvalvular velocity was within the normal range. There was trivial regurgitation.  ------------------------------------------------------------------- Pulmonary artery:   Systolic pressure was within the normal range.   ------------------------------------------------------------------- Right atrium:  The atrium was normal in size. Pacer wire or catheter noted in right atrium.  ------------------------------------------------------------------- Pericardium:  There was no pericardial effusion.  ------------------------------------------------------------------- Systemic veins: Inferior vena cava: The vessel was normal in size.  Myoview Scan 05-29-2018: Study Highlights     Nuclear stress EF: 51%.  This is a low risk study.  The left ventricular ejection fraction is mildly decreased (45-54%).   1. EF 51% with apical hypokinesis.  2. Medium-sized,  moderate intensity basal to apical inferior and inferolateral perfusion defect.  This looks like prior infarction with no ischemia, though wall motion is normal in the inferior and lateral walls. .   Overall, I think this is a low risk study with no evidence for ischemia.     EKG:  EKG is not ordered today.   Recent Labs: 07/27/2018: ALT 22; BUN 15; Creatinine, Ser 1.02; Potassium 4.2; Sodium 141 08/04/2018: Hemoglobin 14.1; Platelets 184 08/17/2018: TSH 2.02  Recent Lipid Panel    Component Value Date/Time   CHOL 154 05/29/2018 0806   TRIG 52 05/29/2018 0806   HDL 60 05/29/2018 0806   CHOLHDL 2.6 05/29/2018 0806   CHOLHDL 2.7 03/19/2016 0922   VLDL 14 03/19/2016 0922   LDLCALC 84 05/29/2018 0806    Physical Exam:    VS:  BP 132/68   Pulse 66   Ht 5' 7.5" (1.715 m)   Wt 185 lb 6.4 oz (84.1 kg)   SpO2 98%   BMI 28.61 kg/m     Wt Readings from Last 3 Encounters:  10/30/18 185 lb 6.4 oz (84.1 kg)  09/20/18 186 lb (84.4 kg)  08/17/18 191 lb 4 oz (86.8 kg)     GEN:  Well nourished, well developed in no acute distress HEENT: Normal NECK: No JVD; No carotid bruits LYMPHATICS: No lymphadenopathy CARDIAC: RRR, 2/6 systolic murmur at the right upper sternal border, early to mid peaking, A2 present RESPIRATORY:  Clear to auscultation without rales, wheezing or rhonchi  ABDOMEN: Soft, non-tender, non-distended MUSCULOSKELETAL:  No edema; No deformity  SKIN: Warm and dry NEUROLOGIC:  Alert and oriented x 3 PSYCHIATRIC:  Normal affect   ASSESSMENT:    1. Coronary artery disease involving native coronary artery of native heart without angina pectoris   2. Paroxysmal atrial fibrillation ([name])   3. Nonrheumatic aortic valve stenosis   4. Mixed hyperlipidemia   5. Essential hypertension    PLAN:    In order of problems listed above:  1. The patient is stable without symptoms of angina.  He is not on antiplatelet therapy because of chronic oral anticoagulation.  He continues  on a statin drug.  No changes are made in his medical regimen today. 2. The patient is maintaining sinus rhythm on current therapy.  He is tolerating anticoagulation with apixaban.  Reports no bleeding problems.  He does note easy bruising. 3. Aortic stenosis is mild by examination and echo studies.  He is asymptomatic.  Continue clinical follow-up. 4. Lipids reviewed as above.  Treated with atorvastatin.  LDL cholesterol 84, HDL 60, triglycerides 52, total cholesterol 154. 5. Blood pressure is controlled on amlodipine.  Also treated with triamterene/hydrochlorothiazide.   Medication Adjustments/Labs and Tests Ordered: Current medicines are reviewed at length with the patient today.  Concerns regarding medicines are outlined above.  No orders of the defined types were placed in this encounter.  No orders of the defined types were placed in this encounter.   Patient Instructions  Medication Instructions:  Your provider recommends that you continue on your current medications as directed. Please refer to the Current Medication list given to you today.    Labwork: None  Testing/Procedures: None  Follow-Up: Your provider wants you to follow-up in: 6 months with Dr. Burt Knack. You will receive a reminder letter in the mail two months in advance. If you don't receive a letter, please call our office to schedule the follow-up appointment.    Any Other Special Instructions Will Be Listed Below (If Applicable).     If you need a refill on your cardiac medications before your next appointment, please call your pharmacy.      Signed, [name], MD  11/01/2018 4:06 PM    [facility]

## 2018-10-30 NOTE — Patient Instructions (Signed)

## 2018-11-01 DIAGNOSIS — G5602 Carpal tunnel syndrome, left upper limb: Secondary | ICD-10-CM | POA: Diagnosis not present

## 2018-11-01 DIAGNOSIS — M180 Bilateral primary osteoarthritis of first carpometacarpal joints: Secondary | ICD-10-CM | POA: Diagnosis not present

## 2018-11-03 DIAGNOSIS — R3912 Poor urinary stream: Secondary | ICD-10-CM | POA: Diagnosis not present

## 2018-11-03 DIAGNOSIS — N5201 Erectile dysfunction due to arterial insufficiency: Secondary | ICD-10-CM | POA: Diagnosis not present

## 2018-11-03 DIAGNOSIS — R972 Elevated prostate specific antigen [PSA]: Secondary | ICD-10-CM | POA: Diagnosis not present

## 2018-11-03 DIAGNOSIS — N401 Enlarged prostate with lower urinary tract symptoms: Secondary | ICD-10-CM | POA: Diagnosis not present

## 2018-11-10 ENCOUNTER — Ambulatory Visit (INDEPENDENT_AMBULATORY_CARE_PROVIDER_SITE_OTHER): Payer: Medicare Other | Admitting: Family Medicine

## 2018-11-10 ENCOUNTER — Encounter: Payer: Self-pay | Admitting: Family Medicine

## 2018-11-10 DIAGNOSIS — I2510 Atherosclerotic heart disease of native coronary artery without angina pectoris: Secondary | ICD-10-CM | POA: Diagnosis not present

## 2018-11-10 DIAGNOSIS — R1032 Left lower quadrant pain: Secondary | ICD-10-CM

## 2018-11-10 NOTE — Assessment & Plan Note (Addendum)
No clear hernia,  Neg colonoscopy except polyps last year. NO tics seen at that time.  No red flags, but continued abd pain, worsened.  Neg UA at [facility]. No S/S hernia  Eval with CT abd pelvis, r/o diverticulitis etc.

## 2018-11-10 NOTE — Patient Instructions (Addendum)
Please stop at the front desk to set up referral. Start miralax daily along with increase fluid  And fiber intake

## 2018-11-10 NOTE — Addendum Note (Signed)
Addended by: Lendon Collar on: 11/10/2018 04:18 PM   Modules accepted: Orders

## 2018-11-10 NOTE — Progress Notes (Signed)
Acute Office Visit  Subjective:    Patient ID: Derrick [name], male    DOB: 1941-03-19, 77 y.o.   MRN: [id number]  Chief Complaint  Patient presents with  . Abdominal Pain    complains of Left lower abdominal pain with some changes in bowel habits.     HPI Patient is in today for  chronic onset LLQ pain x 1 year. He reports worsening in last  Month. 8-9/10.Hervey Ard pain in addition to ache.  No change with food. After light meal.. Has belching and bloating of abdomen.  Now more constipated, some straining, firmer stool. Has to go again few hours after BM.  Now pain is lower and into groin. No blood in stool.  Pain with riding lawn mower.     Had colonoscopy 10/2017   Precancerous polyps  Discussed abd pain with GI at that time felt  Likely due to scarring at colon resection site.  But now pain is lower and worse.   UA clear earlier this week at [facility].  Hx of colon cancer and re-section.  8-10 inches   He has history of diverticulitis. Past Medical History:  Diagnosis Date  . BENIGN PROSTATIC HYPERTROPHY, WITH OBSTRUCTION 05/28/2010  . CAD (coronary artery disease)    a. s/p NSTEMI 06/01/11: DES to RCA;  b. cath 06/25/11:   dLM 50-60% (FFR 0.87), prox to mid LAD 40-50%, D1 50%, pCFX 50%, RCA stent ok, dPDA 80%, EF 55-60%.  His FFR was felt to be negative and therefore medical therapy was recommended ;  echo 6/12: EF 55-60%, mild AS   . Chronic back pain   . CIDP (chronic inflammatory demyelinating polyneuropathy) ([place]) 04/11/2012  . Colon cancer ([place]) dx'd 2000   "left"  . COLONIC POLYPS, ADENOMATOUS, HX OF 05/28/2010  . Complication of anesthesia    "stopped breathing; related to my sleep apnea"   . COPD (chronic obstructive pulmonary disease) ([place])    "associated w/lung sarcoidosis"  . 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 DISH (diffuse idiopathic skeletal hyperostosis) 12/02/2015  . Diverticulitis   . GENERALIZED OSTEOARTHROSIS UNSPECIFIED SITE 05/28/2010  . GERD 05/28/2010  .  Heart murmur   . History of gout   . HYPERLIPIDEMIA 05/28/2010  . Hypertension   . IgM lambda paraproteinemia   . 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 Malignant neoplasm of descending colon ([place]) 05/28/2010  . MITRAL VALVE PROLAPSE 05/28/2010  . 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 NSTEMI (non-ST elevated myocardial infarction) ([place]) 06/03/11  . 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  . OSA on CPAP 05/28/2010  . PALPITATIONS, CHRONIC 05/28/2010  . Parotid gland pain 2010   infection  . Peripheral neuropathy    "tx'd w/targeted chemo" (05/12/2016)  . Pneumonia 2-3 times  . Presence of permanent cardiac pacemaker   . 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 Sarcoidosis    pulmonalis  . UNSPECIFIED INFLAMMATORY AND TOXIC NEUROPATHY 05/28/2010  . Vision loss   . VITAMIN D DEFICIENCY 05/28/2010    Past Surgical History:  Procedure Laterality Date  . APPENDECTOMY  1954  . CATARACT EXTRACTION W/ INTRAOCULAR LENS IMPLANT Left   . COLON SURGERY  2000   decending colon   . CORONARY ANGIOPLASTY WITH STENT PLACEMENT  06/03/11  . ELECTROPHYSIOLOGIC STUDY N/A 05/12/2016   Procedure: Cardioversion;  Surgeon: [name], MD;  Location: [place] [facility];  Service: Cardiovascular;  Laterality: N/A;  . EP IMPLANTABLE DEVICE N/A 05/12/2016  Procedure: Pacemaker Implant;  Surgeon: [name], MD;  Location: [place] [facility];  Service: Cardiovascular;  Laterality: N/A;  . INSERT / REPLACE / REMOVE PACEMAKER  05/12/2016  . KNEE ARTHROSCOPY Right 2003  . LEFT HEART CATHETERIZATION WITH CORONARY ANGIOGRAM N/A 03/31/2015   Procedure: LEFT HEART CATHETERIZATION WITH CORONARY ANGIOGRAM;  Surgeon: [name], MD;  Location: [facility] CATH LAB;  Service: Cardiovascular;  Laterality: N/A;  . LUNG SURGERY  2001   "open lung dissection"  . MOHS SURGERY Left ~ 2008   ear  . PROSTATE BIOPSY  2010  . TONSILLECTOMY  1950    Family History    Problem Relation Age of Onset  . Arthritis Mother        severe  . Coronary artery disease Mother   . Colon polyps Mother   . Heart disease Mother   . Coronary artery disease Brother   . Cancer Brother        choriocarcinoma  . Arrhythmia Brother 11       Afib/Tachycardia  . Heart attack Brother   . Congestive Heart Failure Brother     Social History   Socioeconomic History  . Marital status: Married    Spouse name: Not on file  . Number of children: 2  . Years of education: MS-Coll.  . Highest education level: Not on file  Occupational History  . Occupation: Optometrist    Comment: Building services engineer  Social Needs  . Financial resource strain: Not on file  . Food insecurity:    Worry: Not on file    Inability: Not on file  . Transportation needs:    Medical: Not on file    Non-medical: Not on file  Tobacco Use  . Smoking status: Never Smoker  . Smokeless tobacco: Never Used  Substance and Sexual Activity  . Alcohol use: Yes    Alcohol/week: 14.0 standard drinks    Types: 14 Glasses of wine per week    Comment: 05/12/2016 "10oz wine qd"  . Drug use: No  . Sexual activity: Yes    Birth control/protection: None  Lifestyle  . Physical activity:    Days per week: Not on file    Minutes per session: Not on file  . Stress: Not on file  Relationships  . Social connections:    Talks on phone: Not on file    Gets together: Not on file    Attends religious service: Not on file    Active member of club or organization: Not on file    Attends meetings of clubs or organizations: Not on file    Relationship status: Not on file  . Intimate partner violence:    Fear of current or ex partner: Not on file    Emotionally abused: Not on file    Physically abused: Not on file    Forced sexual activity: Not on file  Other Topics Concern  . Not on file  Social History Narrative   College- [employer]; [employer]-environment science and mgt. Married [name])  "30" 1 son- 40;  1 dtr "70" 2 grandchildren. 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 End of life-provided discussive context and provided packet.    Outpatient Medications Prior to Visit  Medication Sig Dispense Refill  . albuterol (PROVENTIL HFA;VENTOLIN HFA) 108 (90 Base) MCG/ACT inhaler Inhale 2 puffs into the lungs every 6 (six) hours as needed for wheezing or shortness of breath. 1 Inhaler 1  . amLODipine (NORVASC) 10 MG tablet TAKE 1 TABLET  BY MOUTH ONCE DAILY 90 tablet 3  . atorvastatin (LIPITOR) 20 MG tablet Take 1 tablet (20 mg total) by mouth daily. Please keep upcoming appt. Thank you 90 tablet 2  . bimatoprost (LUMIGAN) 0.01 % SOLN Place 1 drop into both eyes at bedtime.    . CHOLECALCIFEROL PO Take 1,000 mg by mouth daily. Vitamin D 3    . dorzolamide-timolol (COSOPT) 22.3-6.8 MG/ML ophthalmic solution Place 1 drop into both eyes 2 (two) times daily.     [name] ELIQUIS 5 MG TABS tablet TAKE 1 TABLET BY MOUTH TWICE DAILY 180 tablet 2  . finasteride (PROSCAR) 5 MG tablet Take 5 mg by mouth daily.     . fluticasone (FLONASE) 50 MCG/ACT nasal spray Place 2 sprays into both nostrils 2 (two) times daily.    . Fluticasone Propionate, Inhal, (FLOVENT DISKUS) 100 MCG/BLIST AEPB Inhale 1 Inhaler into the lungs 2 (two) times daily. 60 each 1  . MULTAQ 400 MG tablet TAKE 1 TABLET BY MOUTH TWICE DAILY WITH MEALS 180 tablet 3  . pantoprazole (PROTONIX) 40 MG tablet Take 1 tablet (40 mg total) by mouth daily. 90 tablet 2  . tadalafil (CIALIS) 5 MG tablet Take 5 mg by mouth daily. Reported on 05/26/2016    . triamterene-hydrochlorothiazide (MAXZIDE-25) 37.5-25 MG tablet TAKE 1 TABLET BY MOUTH ONCE DAILY 90 tablet 3   Facility-Administered Medications Prior to Visit  Medication Dose Route Frequency Provider Last Rate Last Dose  . 0.9 %  sodium chloride infusion  500 mL Intravenous Continuous Pyrtle, Lajuan Lines, MD        No Known Allergies  ROS     Objective:    Physical Exam  Constitutional: Vital  signs are normal. He appears well-developed and well-nourished.  HENT:  Head: Normocephalic.  Right Ear: Hearing normal.  Left Ear: Hearing normal.  Nose: Nose normal.  Mouth/Throat: Oropharynx is clear and moist and mucous membranes are normal.  Neck: Trachea normal. Carotid bruit is not present. No thyroid mass and no thyromegaly present.  Cardiovascular: Normal rate, regular rhythm and normal pulses. Exam reveals no gallop, no distant heart sounds and no friction rub.  No murmur heard. No peripheral edema  Pulmonary/Chest: Effort normal and breath sounds normal. No respiratory distress.  Abdominal: There is no hepatosplenomegaly. There is tenderness in the left lower quadrant. There is no rigidity, no rebound, no guarding, no CVA tenderness, no tenderness at McBurney's point and negative Murphy's sign. No hernia.    No lymphadenopathy felt  no hernia palpated.  focal ttp marked   Skin: Skin is warm, dry and intact. No rash noted.  Psychiatric: He has a normal mood and affect. His speech is normal and behavior is normal. Thought content normal.    BP 126/82 (BP Location: Left Arm, Patient Position: Sitting, Cuff Size: Small)   Pulse 64   Temp 98.6 F (37 C) (Oral)   Resp 16   Ht 5' 7.5" (1.715 m)   Wt 187 lb (84.8 kg)   SpO2 98%   BMI 28.86 kg/m  Wt Readings from Last 3 Encounters:  11/10/18 187 lb (84.8 kg)  10/30/18 185 lb 6.4 oz (84.1 kg)  09/20/18 186 lb (84.4 kg)    There are no preventive care reminders to display for this patient.  There are no preventive care reminders to display for this patient.   Lab Results  Component Value Date   TSH 2.02 08/17/2018   Lab Results  Component Value Date   WBC  5.8 08/04/2018   HGB 14.1 08/04/2018   HCT 41.7 08/04/2018   MCV 92.3 08/04/2018   PLT 184 08/04/2018   Lab Results  Component Value Date   NA 141 07/27/2018   K 4.2 07/27/2018   CHLORIDE 103 07/27/2016   CO2 31 07/27/2018   GLUCOSE 96 07/27/2018   BUN  15 07/27/2018   CREATININE 1.02 07/27/2018   BILITOT 0.7 07/27/2018   ALKPHOS 58 07/27/2018   AST 22 07/27/2018   ALT 22 07/27/2018   PROT 7.4 07/27/2018   ALBUMIN 4.2 07/27/2018   CALCIUM 10.4 (H) 07/27/2018   ANIONGAP 9 07/27/2018   EGFR 73 (L) 07/27/2016   GFR 76.75 03/29/2017   Lab Results  Component Value Date   CHOL 154 05/29/2018   Lab Results  Component Value Date   HDL 60 05/29/2018   Lab Results  Component Value Date   LDLCALC 84 05/29/2018   Lab Results  Component Value Date   TRIG 52 05/29/2018   Lab Results  Component Value Date   CHOLHDL 2.6 05/29/2018   Lab Results  Component Value Date   HGBA1C 5.1 06/13/2018       Assessment & Plan:     [name], MD

## 2018-11-11 LAB — BUN: BUN: 17 mg/dL (ref 7–25)

## 2018-11-11 LAB — CREATININE, SERUM: Creat: 0.93 mg/dL (ref 0.70–1.18)

## 2018-11-13 ENCOUNTER — Ambulatory Visit (INDEPENDENT_AMBULATORY_CARE_PROVIDER_SITE_OTHER)
Admission: RE | Admit: 2018-11-13 | Discharge: 2018-11-13 | Disposition: A | Discharge status: Home / Self Care | Payer: Medicare Other | Source: Ambulatory Visit | Attending: Family Medicine | Admitting: Family Medicine

## 2018-11-13 DIAGNOSIS — R1032 Left lower quadrant pain: Secondary | ICD-10-CM | POA: Diagnosis not present

## 2018-11-13 DIAGNOSIS — N200 Calculus of kidney: Secondary | ICD-10-CM | POA: Diagnosis not present

## 2018-11-13 MED ORDER — IOPAMIDOL (ISOVUE-300) INJECTION 61%
100.0000 mL | Freq: Once | INTRAVENOUS | Status: AC | PRN
  Administered 2018-11-13: 100 mL via INTRAVENOUS

## 2018-12-25 ENCOUNTER — Ambulatory Visit (INDEPENDENT_AMBULATORY_CARE_PROVIDER_SITE_OTHER): Payer: Medicare Other

## 2018-12-25 DIAGNOSIS — I442 Atrioventricular block, complete: Secondary | ICD-10-CM | POA: Diagnosis not present

## 2018-12-25 DIAGNOSIS — M2550 Pain in unspecified joint: Secondary | ICD-10-CM | POA: Diagnosis not present

## 2018-12-25 DIAGNOSIS — R1032 Left lower quadrant pain: Secondary | ICD-10-CM

## 2018-12-25 DIAGNOSIS — M5489 Other dorsalgia: Secondary | ICD-10-CM | POA: Diagnosis not present

## 2018-12-25 DIAGNOSIS — M25561 Pain in right knee: Secondary | ICD-10-CM | POA: Diagnosis not present

## 2018-12-25 DIAGNOSIS — M150 Primary generalized (osteo)arthritis: Secondary | ICD-10-CM | POA: Diagnosis not present

## 2018-12-25 NOTE — Progress Notes (Signed)
Remote pacemaker transmission.   

## 2018-12-26 LAB — CUP PACEART REMOTE DEVICE CHECK
Battery Remaining Longevity: 75 mo
Battery Voltage: 3.01 V
Brady Statistic AP VP Percent: 96.93 %
Brady Statistic AP VS Percent: 0.01 %
Brady Statistic AS VP Percent: 3.06 %
Brady Statistic AS VS Percent: 0 %
Brady Statistic RA Percent Paced: 96.88 %
Brady Statistic RV Percent Paced: 99.88 %
Date Time Interrogation Session: 20191230140638
Implantable Lead Implant Date: 20170517
Implantable Lead Implant Date: 20170517
Implantable Lead Location: 753859
Implantable Lead Location: 753860
Implantable Lead Model: 5076
Implantable Lead Model: 5076
Implantable Pulse Generator Implant Date: 20170517
Lead Channel Impedance Value: 399 Ohm
Lead Channel Impedance Value: 437 Ohm
Lead Channel Impedance Value: 494 Ohm
Lead Channel Impedance Value: 627 Ohm
Lead Channel Pacing Threshold Amplitude: 0.75 V
Lead Channel Pacing Threshold Amplitude: 0.875 V
Lead Channel Pacing Threshold Pulse Width: 0.4 ms
Lead Channel Pacing Threshold Pulse Width: 0.4 ms
Lead Channel Sensing Intrinsic Amplitude: 10.25 mV
Lead Channel Sensing Intrinsic Amplitude: 10.25 mV
Lead Channel Sensing Intrinsic Amplitude: 2.25 mV
Lead Channel Sensing Intrinsic Amplitude: 2.25 mV
Lead Channel Setting Pacing Amplitude: 2 V
Lead Channel Setting Pacing Amplitude: 2.5 V
Lead Channel Setting Pacing Pulse Width: 0.4 ms
Lead Channel Setting Sensing Sensitivity: 2.8 mV

## 2018-12-29 ENCOUNTER — Telehealth: Payer: Self-pay

## 2018-12-29 MED ORDER — METOPROLOL TARTRATE 25 MG PO TABS: 25.0000 mg | ORAL_TABLET | Freq: Two times a day (BID) | ORAL | 3 refills | Status: DC

## 2018-12-29 NOTE — Telephone Encounter (Signed)
-----   Message from [name], MD sent at 12/29/2018 11:04 AM EST ----- Abnormal device interrogation reviewed.  Lead parameters and battery status stable.  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  Would start 25 mg of metoprolol twice a day.

## 2018-12-29 NOTE — Telephone Encounter (Signed)
Spoke with pt and reviewed his pacer check. Pt agrees to begin Metoprolol Tartrate 25mg  bid. He was educated on possible side effects and BP monitoring. Pt verbalized understanding of our discussion had no additional questions.

## 2019-01-12 ENCOUNTER — Other Ambulatory Visit: Payer: Self-pay | Admitting: Cardiology

## 2019-01-12 ENCOUNTER — Other Ambulatory Visit: Payer: Self-pay | Admitting: Cardiovascular Disease

## 2019-01-12 DIAGNOSIS — I4891 Unspecified atrial fibrillation: Secondary | ICD-10-CM

## 2019-01-17 ENCOUNTER — Ambulatory Visit: Payer: Self-pay | Admitting: Surgery

## 2019-01-17 DIAGNOSIS — K4090 Unilateral inguinal hernia, without obstruction or gangrene, not specified as recurrent: Secondary | ICD-10-CM | POA: Diagnosis not present

## 2019-01-17 NOTE — H&P (Signed)
Derrick [name] Documented: 01/17/2019 3:01 PM Location: [facility] Patient #: [id number] DOB: 1941/07/20 Married / Language: Derrick [name] / Race: White Male  History of Present Illness Derrick [name] A. Andersson Larrabee MD; 01/17/2019 3:24 PM) Patient words: Patient sent at the request of Dr. [name] for left groin pain 2-3 months. The patient developed left groin pain especially with lifting or straining. The pain location is left groin without radiation to his back. The pain is made better with recumbency also there is a small bulge in his left groin that is able to push and reduce it which helps alleviate some of the pain. The pain is not all the time and is episodic made worse with exertion.  The patient is a 78 year old male.   Past Surgical History [name], CMA; 01/17/2019 3:01 PM) Appendectomy Cataract Surgery Left. Colon Polyp Removal - Colonoscopy Colon Removal - Partial Knee Surgery Right. Lung Surgery Right. Tonsillectomy  Diagnostic Studies History [name], [place]; 01/17/2019 3:01 PM) Colonoscopy 1-5 years ago  Allergies [name], CMA; 01/17/2019 3:02 PM) No Known Allergies [01/17/2019]: No Known Drug Allergies [01/17/2019]: Allergies Reconciled  Medication History [name], CMA; 01/17/2019 3:03 PM) amLODIPine Besylate (10MG  Tablet, Oral) Active. Atorvastatin Calcium (20MG  Tablet, Oral) Active. Doxycycline Hyclate (100MG  Tablet, Oral) Active. Dorzolamide HCl-Timolol Mal (22.3-6.8MG /ML Solution, Ophthalmic) Active. Eliquis (5MG  Tablet, Oral) Active. Finasteride (5MG  Tablet, Oral) Active. Flovent Diskus (100MCG/BLIST Aero Pow Br Act, Inhalation) Active. Gabapentin (800MG  Tablet, Oral) Active. Lumigan (0.01% Solution, Ophthalmic) Active. Pantoprazole Sodium (40MG  Tablet DR, Oral) Active. Metoprolol Tartrate (25MG  Tablet, Oral) Active. Medications Reconciled  Social History [name] [place], CMA; 01/17/2019 3:01 PM) Alcohol use  Occasional alcohol use. Caffeine use Coffee. No drug use Tobacco use Never smoker.  Family History [name], [place]; 01/17/2019 3:01 PM) Arthritis Brother, Daughter, Mother, Son. Cancer Brother. Cerebrovascular Accident Father. Colon Polyps Brother, Mother, Son. Depression Mother, Son. Diabetes Mellitus Son. Heart Disease Brother, Mother. Heart disease in male family member before age 49 Heart disease in male family member before age 48 Hypertension Brother, Mother. Kidney Disease Brother. Thyroid problems Brother, Daughter, Mother, Son.  Other Problems [name], CMA; 01/17/2019 3:01 PM) Arthritis Asthma Atrial Fibrillation Back Pain Chest pain Chronic Obstructive Lung Disease Colon Cancer Congestive Heart Failure Enlarged Prostate Gastroesophageal Reflux Disease Heart murmur High blood pressure Hypercholesterolemia Kidney Stone Myocardial infarction Sleep Apnea     Review of Systems [name]; 01/17/2019 3:01 PM) General Present- Fatigue and Night Sweats. Not Present- Appetite Loss, Chills, Fever, Weight Gain and Weight Loss. Skin Not Present- Change in Wart/Mole, Dryness, Hives, Jaundice, New Lesions, Non-Healing Wounds, Rash and Ulcer. HEENT Present- Hearing Loss, Ringing in the Ears and Wears glasses/contact lenses. Not Present- Earache, Hoarseness, Nose Bleed, Oral Ulcers, Seasonal Allergies, Sinus Pain, Sore Throat, Visual Disturbances and Yellow Eyes. Respiratory Not Present- Bloody sputum, Chronic Cough, Difficulty Breathing, Snoring and Wheezing. Breast Not Present- Breast Mass, Breast Pain, Nipple Discharge and Skin Changes. Cardiovascular Present- Swelling of Extremities. Not Present- Chest Pain, Difficulty Breathing Lying Down, Leg Cramps, Palpitations, Rapid Heart Rate and Shortness of Breath. Gastrointestinal Present- Abdominal Pain. Not Present- Bloating, Bloody Stool, Change in Bowel Habits, Chronic  diarrhea, Constipation, Difficulty Swallowing, Excessive gas, Gets full quickly at meals, Hemorrhoids, Indigestion, Nausea, Rectal Pain and Vomiting. Male Genitourinary Present- Frequency. Not Present- Blood in Urine, Change in Urinary Stream, Impotence, Nocturia, Painful Urination, Urgency and Urine Leakage. Musculoskeletal Present- Back Pain, Joint Pain and Joint Stiffness. Not Present- Muscle Pain, Muscle Weakness and Swelling of Extremities.  Neurological Present- Numbness, Tingling, Trouble walking and Weakness. Not Present- Decreased Memory, Fainting, Headaches, Seizures and Tremor. Psychiatric Not Present- Anxiety, Bipolar, Change in Sleep Pattern, Depression, Fearful and Frequent crying. Endocrine Present- Cold Intolerance. Not Present- Excessive Hunger, Hair Changes, Heat Intolerance, Hot flashes and New Diabetes. Hematology Present- Blood Thinners, Easy Bruising and Excessive bleeding. Not Present- Gland problems, HIV and Persistent Infections.  Vitals ([name]; 01/17/2019 3:04 PM) 01/17/2019 3:03 PM Weight: 184.5 lb Temp.: 97.37F(Temporal)  Pulse: 97 (Regular)  P.OX: 95% (Room air) BP: 142/72 (Sitting, Right Arm, Standard)      Physical Exam (Khayri Kargbo A. Gram Siedlecki MD; 01/17/2019 3:26 PM)  General Mental Status-Alert. General Appearance-Consistent with stated age. Hydration-Well hydrated. Voice-Normal.  Head and Neck Head-normocephalic, atraumatic with no lesions or palpable masses. Trachea-midline. Thyroid Gland Characteristics - normal size and consistency.  Chest and Lung Exam Chest and lung exam reveals -quiet, even and easy respiratory effort with no use of accessory muscles and on auscultation, normal breath sounds, no adventitious sounds and normal vocal resonance. Inspection Chest Wall - Normal. Back - normal.  Cardiovascular Note: Atrial fibrillation.  Pacemaker in place  Abdomen Note: Lower midline scar noted. Reducible left  inguinal hernia. No evidence of right inguinal hernia.  Neurologic Neurologic evaluation reveals -alert and oriented x 3 with no impairment of recent or remote memory. Mental Status-Normal.  Musculoskeletal Normal Exam - Left-Upper Extremity Strength Normal and Lower Extremity Strength Normal. Normal Exam - Right-Upper Extremity Strength Normal and Lower Extremity Strength Normal.    Assessment & Plan (Kellye Mizner A. Jorgia Manthei MD; 01/17/2019 3:27 PM)  LEFT INGUINAL HERNIA (K40.90) Impression: discussed repair of left inguinal hernia which is reducible. Needs cardiac clearance. Once this is done we'll schedule surgery.  The risk of hernia repair include bleeding, infection, organ injury, bowel injury, bladder injury, nerve injury recurrent hernia, blood clots, worsening of underlying condition, chronic pain, mesh use, open surgery, death, and the need for other operattions. Pt agrees to proceed  Current Plans You are being scheduled for surgery- Our schedulers will call you.  You should hear from our office's scheduling department within 5 working days about the location, date, and time of surgery. We try to make accommodations for patient's preferences in scheduling surgery, but sometimes the OR schedule or the surgeon's schedule prevents [place] from making those accommodations.  If you have not heard from our office [phone]) in 5 working days, call the office and ask for your surgeon's nurse.  If you have other questions about your diagnosis, plan, or surgery, call the office and ask for your surgeon's nurse.  Pt Education - Pamphlet Given - Hernia Surgery: discussed with patient and provided information. The anatomy & physiology of the abdominal wall and pelvic floor was discussed. The pathophysiology of hernias in the inguinal and pelvic region was discussed. Natural history risks such as progressive enlargement, pain, incarceration, and strangulation was discussed.  Contributors to complications such as smoking, obesity, diabetes, prior surgery, etc were discussed.  I feel the risks of no intervention will lead to serious problems that outweigh the operative risks; therefore, I recommended surgery to reduce and repair the hernia. I explained an open approach. I noted usual use of mesh to patch and/or buttress hernia repair  Risks such as bleeding, infection, abscess, need for further treatment, heart attack, death, and other risks were discussed. I noted a good likelihood this will help address the problem. Goals of post-operative recovery were discussed as well. Possibility that this will not correct  all symptoms was explained. I stressed the importance of low-impact activity, aggressive pain control, avoiding constipation, & not pushing through pain to minimize risk of post-operative chronic pain or injury. Possibility of reherniation was discussed. We will work to minimize complications.  An educational handout further explaining the pathology & treatment options was given as well. Questions were answered. The patient expresses understanding & wishes to proceed with surgery.  Pt Education - CCS Mesh education: discussed with patient and provided information.

## 2019-01-17 NOTE — H&P (Signed)
Derrick [name] Documented: 01/17/2019 3:01 PM Location: [facility] Patient #: [id number] DOB: 04/25/1941 Married / Language: Derrick [name] / Race: White Male  History of Present Illness Derrick [name] A. Derrick Burkle MD; 01/17/2019 3:24 PM) Patient words: Patient sent at the request of Dr. Diona [name] for left groin pain 2-3 months. The patient developed left groin pain especially with lifting or straining. The pain location is left groin without radiation to his back. The pain is made better with recumbency also there is a small bulge in his left groin that is able to push and reduce it which helps alleviate some of the pain. The pain is not all the time and is episodic made worse with exertion.  The patient is a 78 year old male.   Past Surgical History Derrick [name], CMA; 01/17/2019 3:01 PM) Appendectomy Cataract Surgery Left. Colon Polyp Removal - Colonoscopy Colon Removal - Partial Knee Surgery Right. Lung Surgery Right. Tonsillectomy  Diagnostic Studies History Derrick [name], [place]; 01/17/2019 3:01 PM) Colonoscopy 1-5 years ago  Allergies Derrick [name], CMA; 01/17/2019 3:02 PM) No Known Allergies [01/17/2019]: No Known Drug Allergies [01/17/2019]: Allergies Reconciled  Medication History Derrick [name], CMA; 01/17/2019 3:03 PM) amLODIPine Besylate (10MG  Tablet, Oral) Active. Atorvastatin Calcium (20MG  Tablet, Oral) Active. Doxycycline Hyclate (100MG  Tablet, Oral) Active. Dorzolamide HCl-Timolol Mal (22.3-6.8MG /ML Solution, Ophthalmic) Active. Eliquis (5MG  Tablet, Oral) Active. Finasteride (5MG  Tablet, Oral) Active. Flovent Diskus (100MCG/BLIST Aero Pow Br Act, Inhalation) Active. Gabapentin (800MG  Tablet, Oral) Active. Lumigan (0.01% Solution, Ophthalmic) Active. Pantoprazole Sodium (40MG  Tablet DR, Oral) Active. Metoprolol Tartrate (25MG  Tablet, Oral) Active. Medications Reconciled  Social History Derrick [name], CMA; 01/17/2019 3:01 PM) Alcohol use  Occasional alcohol use. Caffeine use Coffee. No drug use Tobacco use Never smoker.  Family History Derrick [name], [place]; 01/17/2019 3:01 PM) Arthritis Brother, Daughter, Mother, Son. Cancer Brother. Cerebrovascular Accident Father. Colon Polyps Brother, Mother, Son. Depression Mother, Son. Diabetes Mellitus Son. Heart Disease Brother, Mother. Heart disease in male family member before age 77 Heart disease in male family member before age 107 Hypertension Brother, Mother. Kidney Disease Brother. Thyroid problems Brother, Daughter, Mother, Son.  Other Problems Derrick [name], CMA; 01/17/2019 3:01 PM) Arthritis Asthma Atrial Fibrillation Back Pain Chest pain Chronic Obstructive Lung Disease Colon Cancer Congestive Heart Failure Enlarged Prostate Gastroesophageal Reflux Disease Heart murmur High blood pressure Hypercholesterolemia Kidney Stone Myocardial infarction Sleep Apnea     Review of Systems Derrick [name] CMA; 01/17/2019 3:01 PM) General Present- Fatigue and Night Sweats. Not Present- Appetite Loss, Chills, Fever, Weight Gain and Weight Loss. Skin Not Present- Change in Wart/Mole, Dryness, Hives, Jaundice, New Lesions, Non-Healing Wounds, Rash and Ulcer. HEENT Present- Hearing Loss, Ringing in the Ears and Wears glasses/contact lenses. Not Present- Earache, Hoarseness, Nose Bleed, Oral Ulcers, Seasonal Allergies, Sinus Pain, Sore Throat, Visual Disturbances and Yellow Eyes. Respiratory Not Present- Bloody sputum, Chronic Cough, Difficulty Breathing, Snoring and Wheezing. Breast Not Present- Breast Mass, Breast Pain, Nipple Discharge and Skin Changes. Cardiovascular Present- Swelling of Extremities. Not Present- Chest Pain, Difficulty Breathing Lying Down, Leg Cramps, Palpitations, Rapid Heart Rate and Shortness of Breath. Gastrointestinal Present- Abdominal Pain. Not Present- Bloating, Bloody Stool, Change in Bowel Habits, Chronic  diarrhea, Constipation, Difficulty Swallowing, Excessive gas, Gets full quickly at meals, Hemorrhoids, Indigestion, Nausea, Rectal Pain and Vomiting. Male Genitourinary Present- Frequency. Not Present- Blood in Urine, Change in Urinary Stream, Impotence, Nocturia, Painful Urination, Urgency and Urine Leakage. Musculoskeletal Present- Back Pain, Joint Pain and Joint Stiffness. Not Present- Muscle Pain, Muscle Weakness and Swelling of Extremities.  Neurological Present- Numbness, Tingling, Trouble walking and Weakness. Not Present- Decreased Memory, Fainting, Headaches, Seizures and Tremor. Psychiatric Not Present- Anxiety, Bipolar, Change in Sleep Pattern, Depression, Fearful and Frequent crying. Endocrine Present- Cold Intolerance. Not Present- Excessive Hunger, Hair Changes, Heat Intolerance, Hot flashes and New Diabetes. Hematology Present- Blood Thinners, Easy Bruising and Excessive bleeding. Not Present- Gland problems, HIV and Persistent Infections.  Vitals (Derrick [name] CMA; 01/17/2019 3:04 PM) 01/17/2019 3:03 PM Weight: 184.5 lb Temp.: 97.81F(Temporal)  Pulse: 97 (Regular)  P.OX: 95% (Room air) BP: 142/72 (Sitting, Right Arm, Standard)      Physical Exam (Derrick [name] A. Derrick Berkovich MD; 01/17/2019 3:26 PM)  General Mental Status-Alert. General Appearance-Consistent with stated age. Hydration-Well hydrated. Voice-Normal.  Head and Neck Head-normocephalic, atraumatic with no lesions or palpable masses. Trachea-midline. Thyroid Gland Characteristics - normal size and consistency.  Chest and Lung Exam Chest and lung exam reveals -quiet, even and easy respiratory effort with no use of accessory muscles and on auscultation, normal breath sounds, no adventitious sounds and normal vocal resonance. Inspection Chest Wall - Normal. Back - normal.  Cardiovascular Note: Atrial fibrillation.  Pacemaker in place  Abdomen Note: Lower midline scar noted. Reducible left  inguinal hernia. No evidence of right inguinal hernia.  Neurologic Neurologic evaluation reveals -alert and oriented x 3 with no impairment of recent or remote memory. Mental Status-Normal.  Musculoskeletal Normal Exam - Left-Upper Extremity Strength Normal and Lower Extremity Strength Normal. Normal Exam - Right-Upper Extremity Strength Normal and Lower Extremity Strength Normal.    Assessment & Plan (Juleon Narang A. Sota Hetz MD; 01/17/2019 3:27 PM)  LEFT INGUINAL HERNIA (K40.90) Impression: discussed repair of left inguinal hernia which is reducible. Needs cardiac clearance. Once this is done we'll schedule surgery.  The risk of hernia repair include bleeding, infection, organ injury, bowel injury, bladder injury, nerve injury recurrent hernia, blood clots, worsening of underlying condition, chronic pain, mesh use, open surgery, death, and the need for other operattions. Pt agrees to proceed  Current Plans You are being scheduled for surgery- Our schedulers will call you.  You should hear from our office's scheduling department within 5 working days about the location, date, and time of surgery. We try to make accommodations for patient's preferences in scheduling surgery, but sometimes the OR schedule or the surgeon's schedule prevents [place] from making those accommodations.  If you have not heard from our office [phone]) in 5 working days, call the office and ask for your surgeon's nurse.  If you have other questions about your diagnosis, plan, or surgery, call the office and ask for your surgeon's nurse.  Pt Education - Pamphlet Given - Hernia Surgery: discussed with patient and provided information. The anatomy & physiology of the abdominal wall and pelvic floor was discussed. The pathophysiology of hernias in the inguinal and pelvic region was discussed. Natural history risks such as progressive enlargement, pain, incarceration, and strangulation was discussed.  Contributors to complications such as smoking, obesity, diabetes, prior surgery, etc were discussed.  I feel the risks of no intervention will lead to serious problems that outweigh the operative risks; therefore, I recommended surgery to reduce and repair the hernia. I explained an open approach. I noted usual use of mesh to patch and/or buttress hernia repair  Risks such as bleeding, infection, abscess, need for further treatment, heart attack, death, and other risks were discussed. I noted a good likelihood this will help address the problem. Goals of post-operative recovery were discussed as well. Possibility that this will not correct  all symptoms was explained. I stressed the importance of low-impact activity, aggressive pain control, avoiding constipation, & not pushing through pain to minimize risk of post-operative chronic pain or injury. Possibility of reherniation was discussed. We will work to minimize complications.  An educational handout further explaining the pathology & treatment options was given as well. Questions were answered. The patient expresses understanding & wishes to proceed with surgery.  Pt Education - CCS Mesh education: discussed with patient and provided information.

## 2019-01-18 ENCOUNTER — Telehealth: Payer: Self-pay | Admitting: *Deleted

## 2019-01-18 NOTE — Telephone Encounter (Signed)
   [facility] Pre-operative Risk Assessment    Request for surgical clearance:  1. What type of surgery is being performed? HERNIA REPAIR   2. When is this surgery scheduled? TBD   3. What type of clearance is required (medical clearance vs. Pharmacy clearance to hold med vs. Both)? BOTH  4. Are there any medications that need to be held prior to surgery and how long?ELIQUIS   5. Practice name and name of physician performing surgery? [facility]; DR. Brantley Stage   6. What is your office phone number [phone]    7.   What is your office fax number [phone]  8.   Anesthesia type (None, local, MAC, general) ? GENERAL   Julaine Hua 01/18/2019, 11:33 AM  _________________________________________________________________   (provider comments below)

## 2019-01-18 NOTE — Telephone Encounter (Signed)
Patient with diagnosis of afib on Eliquis for anticoagulation.    Procedure: HERNIA REPAIR  Date of procedure: TBD  CHADS2-VASc score of  5 (CHF, HTN, AGE, DM2, stroke/tia x 2, CAD, AGE, male)  Per office protocol, patient can hold Eliquis for 2 days prior to procedure.

## 2019-01-19 NOTE — Telephone Encounter (Signed)
   Primary [place], MD  Chart reviewed as part of pre-operative protocol coverage. Because of Frutoso Dimare past medical history and time since last visit, he/she will require a follow-up visit in order to better assess preoperative cardiovascular risk.  Pt has had some NSVT and his medications have been adjusted.  He should be seen in the office before surgery.  Pre-op covering staff: - Please schedule appointment with Dr Burt Knack or his APP at [facility] and call patient to inform them. - Please contact requesting surgeon's office via preferred method (i.e, phone, fax) to inform them of need for appointment prior to surgery.  If applicable, this message will also be routed to pharmacy pool and/or primary cardiologist for input on holding anticoagulant/antiplatelet agent as requested below so that this information is available at time of patient's appointment.   Kerin Ransom, PA-C  01/19/2019, 2:45 PM

## 2019-01-19 NOTE — Telephone Encounter (Signed)
Called patient to clarify why he needs preop visit appointment. Per preop note from 1/23, "Chart reviewed as part of pre-operative protocol coverage. Because of Derrick [name] past medical history and time since last visit, he/she will require a follow-up visit in order to better assess preoperative cardiovascular risk.  Pt has had some NSVT and his medications have been adjusted.  He should be seen in the office before surgery."  He now understands. He was grateful for call back.

## 2019-01-19 NOTE — Telephone Encounter (Signed)
New Message   Patient calling to find out what he needs to do or have done to expedite the clearance to have surgery.

## 2019-01-19 NOTE — Telephone Encounter (Signed)
Spoke with patient and he was agreeable with appt for Tuesday 01/23/2019 with Isaac Laud.

## 2019-01-23 ENCOUNTER — Encounter: Payer: Self-pay | Admitting: Physician Assistant

## 2019-01-23 ENCOUNTER — Ambulatory Visit (INDEPENDENT_AMBULATORY_CARE_PROVIDER_SITE_OTHER): Payer: Medicare Other | Admitting: Physician Assistant

## 2019-01-23 VITALS — BP 118/68 | HR 84 | Ht 67.5 in | Wt 184.5 lb

## 2019-01-23 DIAGNOSIS — Z01810 Encounter for preprocedural cardiovascular examination: Secondary | ICD-10-CM | POA: Diagnosis not present

## 2019-01-23 DIAGNOSIS — Z9989 Dependence on other enabling machines and devices: Secondary | ICD-10-CM

## 2019-01-23 DIAGNOSIS — D860 Sarcoidosis of lung: Secondary | ICD-10-CM | POA: Diagnosis not present

## 2019-01-23 DIAGNOSIS — G4733 Obstructive sleep apnea (adult) (pediatric): Secondary | ICD-10-CM | POA: Diagnosis not present

## 2019-01-23 DIAGNOSIS — E785 Hyperlipidemia, unspecified: Secondary | ICD-10-CM | POA: Diagnosis not present

## 2019-01-23 DIAGNOSIS — I480 Paroxysmal atrial fibrillation: Secondary | ICD-10-CM

## 2019-01-23 DIAGNOSIS — I10 Essential (primary) hypertension: Secondary | ICD-10-CM | POA: Diagnosis not present

## 2019-01-23 DIAGNOSIS — I2510 Atherosclerotic heart disease of native coronary artery without angina pectoris: Secondary | ICD-10-CM

## 2019-01-23 NOTE — Patient Instructions (Addendum)
Medication Instructions:  Your physician recommends that you continue on your current medications as directed. Please refer to the Current Medication list given to you today.  If you need a refill on your cardiac medications before your next appointment, please call your pharmacy.   Lab work: None ordered  If you have labs (blood work) drawn today and your tests are completely normal, you will receive your results only by: [name] MyChart Message (if you have MyChart) OR . A paper copy in the mail If you have any lab test that is abnormal or we need to change your treatment, we will call you to review the results.  Testing/Procedures: None ordered  Follow-Up: Your physician recommends that you schedule a follow-up appointment in: [place]    Any Other Special Instructions Will Be Listed Below (If Applicable).

## 2019-01-23 NOTE — Progress Notes (Signed)
Cardiology Office Note    Date:  01/25/2019   ID:  Derrick [name], DOB [birth year], MRN [id number]  PCP:  Derrick Sanders, MD  Cardiologist:  Dr. Burt [name] Electrophysiology: Dr. Curt [name]  Chief Complaint  Patient presents with  . Follow-up    seen for Dr. Burt [name]    History of Present Illness:  Derrick [name] is a 78 y.o. male with PMH of CAD, PAF, chronic inflammatory demyelinating polyneuropathy, hyperlipidemia, hypertension, obstructive sleep apnea on CPAP, pulmonary sarcoidosis and monoclonal gammopathy of undetermined significance.  Patient had a inferior wall MI and underwent DES to RCA in 2012.  He also had moderate left main disease 50 to 60% with FFR of 0.7, 40 to 50% proximal to mid LAD disease, 30% D1, 50% proximal left circumflex, 80% RPDA lesion.  FFR was felt to be negative therefore medical therapy was recommended.  He underwent permanent pacemaker placement in 2017 for paroxysmal atrial fibrillation with bradycardic episode.  Myoview performed on 05/29/2018 showed EF 51%, medium sized moderate intensity basal to apical inferior and inferolateral perfusion defect consistent with prior MI with no ischemia, overall low risk study.  Echocardiogram obtained on 09/27/2018 showed EF 50 to 55%, hypokinesis of the apical myocardium, grade 1 DD, mild aortic stenosis, mildly dilated ascending aorta.  Patient was last seen by Dr. Burt [name] on 10/30/2018 at which time he was doing well.  Last device interrogation on 12/26/2018 showed stable battery status, 26 beats of nonsustained VT, metoprolol 25 mg twice daily was started.  Patient presents today for preoperative clearance prior to L hernia repair by Dr. Brantley [name] of [facility] surgery.  He says he continues to ambulate for 2 miles frequently with his wife.  He denies any recent exertional chest pain or shortness of breath.  He is previous anginal was pain between the shoulder blades, he has not noticed any similar symptoms recently.  He has no lower  extremity edema, orthopnea or PND.  He is tolerating the added dose of metoprolol without any issue.  He is cleared to proceed with surgery from cardiology perspective given reassuring symptom and stress test within the past year.  His RCRI perioperative risk is class III 6.6% major cardiac risk during procedure.  He has been instructed to hold Eliquis for 2 days prior to the surgery and restart Eliquis as soon as possible after the surgery at the discretion of the surgeon.   Past Medical History:  Diagnosis Date  . BENIGN PROSTATIC HYPERTROPHY, WITH OBSTRUCTION 05/28/2010  . CAD (coronary artery disease)    a. s/p NSTEMI 06/01/11: DES to RCA;  b. cath 06/25/11:   dLM 50-60% (FFR 0.87), prox to mid LAD 40-50%, D1 50%, pCFX 50%, RCA stent ok, dPDA 80%, EF 55-60%.  His FFR was felt to be negative and therefore medical therapy was recommended ;  echo 6/12: EF 55-60%, mild AS   . Chronic back pain   . CIDP (chronic inflammatory demyelinating polyneuropathy) ([place]) 04/11/2012  . Colon cancer ([place]) dx'd 2000   "left"  . COLONIC POLYPS, ADENOMATOUS, HX OF 05/28/2010  . Complication of anesthesia    "stopped breathing; related to my sleep apnea"   . COPD (chronic obstructive pulmonary disease) ([place])    "associated w/lung sarcoidosis"  . 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 DISH (diffuse idiopathic skeletal hyperostosis) 12/02/2015  . Diverticulitis   . GENERALIZED OSTEOARTHROSIS UNSPECIFIED SITE 05/28/2010  . GERD 05/28/2010  . Heart murmur   . History of gout   . HYPERLIPIDEMIA 05/28/2010  .  Hypertension   . IgM lambda paraproteinemia   . 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 Malignant neoplasm of descending colon ([place]) 05/28/2010  . MITRAL VALVE PROLAPSE 05/28/2010  . 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 NSTEMI (non-ST elevated myocardial infarction) ([place]) 06/03/11  . 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  . OSA on CPAP 05/28/2010  .  PALPITATIONS, CHRONIC 05/28/2010  . Parotid gland pain 2010   infection  . Peripheral neuropathy    "tx'd w/targeted chemo" (05/12/2016)  . Pneumonia 2-3 times  . Presence of permanent cardiac pacemaker   . 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 Sarcoidosis    pulmonalis  . UNSPECIFIED INFLAMMATORY AND TOXIC NEUROPATHY 05/28/2010  . Vision loss   . VITAMIN D DEFICIENCY 05/28/2010    Past Surgical History:  Procedure Laterality Date  . APPENDECTOMY  1954  . CATARACT EXTRACTION W/ INTRAOCULAR LENS IMPLANT Left   . COLON SURGERY  2000   decending colon   . CORONARY ANGIOPLASTY WITH STENT PLACEMENT  06/03/11  . ELECTROPHYSIOLOGIC STUDY N/A 05/12/2016   Procedure: Cardioversion;  Surgeon: [name], MD;  Location: [place] [facility];  Service: Cardiovascular;  Laterality: N/A;  . EP IMPLANTABLE DEVICE N/A 05/12/2016   Procedure: Pacemaker Implant;  Surgeon: [name], MD;  Location: [place] [facility];  Service: Cardiovascular;  Laterality: N/A;  . INSERT / REPLACE / REMOVE PACEMAKER  05/12/2016  . KNEE ARTHROSCOPY Right 2003  . LEFT HEART CATHETERIZATION WITH CORONARY ANGIOGRAM N/A 03/31/2015   Procedure: LEFT HEART CATHETERIZATION WITH CORONARY ANGIOGRAM;  Surgeon: [name], MD;  [facility] C[facility] LAB;  Service: Cardiovascular;  Laterality: N/A;  . LUNG SURGERY  2001   "open lung dissection"  . MOHS SURGERY Left ~ 2008   ear  . PROSTATE BIOPSY  2010  . TONSILLECTOMY  1950    Current Medications: Outpatient Medications Prior to Visit  Medication Sig Dispense Refill  . albuterol (PROVENTIL HFA;VENTOLIN HFA) 108 (90 Base) MCG/ACT inhaler Inhale 2 puffs into the lungs every 6 (six) hours as needed for wheezing or shortness of breath. 1 Inhaler 1  . amLODipine (NORVASC) 10 MG tablet TAKE 1 TABLET BY MOUTH ONCE DAILY 90 tablet 3  . atorvastatin (LIPITOR) 20 MG tablet Take 1 tablet (20 mg total) by mouth daily at 6 PM. 90 tablet 3  . bimatoprost (LUMIGAN) 0.01 % SOLN Place 1  drop into both eyes at bedtime.    . CHOLECALCIFEROL PO Take 1,000 mg by mouth daily. Vitamin D 3    . dorzolamide-timolol (COSOPT) 22.3-6.8 MG/ML ophthalmic solution Place 1 drop into both eyes 2 (two) times daily.     [name] ELIQUIS 5 MG TABS tablet TAKE 1 TABLET BY MOUTH TWICE DAILY 180 tablet 0  . finasteride (PROSCAR) 5 MG tablet Take 5 mg by mouth daily.     . fluticasone (FLONASE) 50 MCG/ACT nasal spray Place 2 sprays into both nostrils 2 (two) times daily.    . Fluticasone Propionate, Inhal, (FLOVENT DISKUS) 100 MCG/BLIST AEPB Inhale 1 Inhaler into the lungs 2 (two) times daily. 60 each 1  . metoprolol tartrate (LOPRESSOR) 25 MG tablet Take 1 tablet (25 mg total) by mouth 2 (two) times daily. 180 tablet 3  . MULTAQ 400 MG tablet TAKE 1 TABLET BY MOUTH TWICE DAILY WITH MEALS 180 tablet 3  . pantoprazole (PROTONIX) 40 MG tablet TAKE 1 TABLET BY MOUTH ONCE DAILY 90 tablet 3  . tadalafil (CIALIS) 5  MG tablet Take 5 mg by mouth daily. Reported on 05/26/2016    . triamterene-hydrochlorothiazide (MAXZIDE-25) 37.5-25 MG tablet TAKE 1 TABLET BY MOUTH ONCE DAILY 90 tablet 3   Facility-Administered Medications Prior to Visit  Medication Dose Route Frequency Provider Last Rate Last Dose  . 0.9 %  sodium chloride infusion  500 mL Intravenous Continuous Pyrtle, Lajuan Lines, MD         Allergies:   Patient has no known allergies.   Social History   Socioeconomic History  . Marital status: Married    Spouse name: Not on file  . Number of children: 2  . Years of education: MS-Coll.  . Highest education level: Not on file  Occupational History  . Occupation: Optometrist    Comment: Building services engineer  Social Needs  . Financial resource strain: Not on file  . Food insecurity:    Worry: Not on file    Inability: Not on file  . Transportation needs:    Medical: Not on file    Non-medical: Not on file  Tobacco Use  . Smoking status: Never Smoker  . Smokeless tobacco: Never Used  Substance and  Sexual Activity  . Alcohol use: Yes    Alcohol/week: 14.0 standard drinks    Types: 14 Glasses of wine per week    Comment: 05/12/2016 "10oz wine qd"  . Drug use: No  . Sexual activity: Yes    Birth control/protection: None  Lifestyle  . Physical activity:    Days per week: Not on file    Minutes per session: Not on file  . Stress: Not on file  Relationships  . Social connections:    Talks on phone: Not on file    Gets together: Not on file    Attends religious service: Not on file    Active member of club or organization: Not on file    Attends meetings of clubs or organizations: Not on file    Relationship status: Not on file  Other Topics Concern  . Not on file  Social History Narrative   College- [employer]; [employer]-environment science and mgt. Married [name])  "60" 1 son- 62; 1 dtr "62" 2 grandchildren. 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 End of life-provided discussive context and provided packet.     Family History:  The patient's family history includes Arrhythmia (age of onset: 77) in his brother; Arthritis in his mother; Cancer in his brother; Colon polyps in his mother; Congestive Heart Failure in his brother; Coronary artery disease in his brother and mother; Heart attack in his brother; Heart disease in his mother.   ROS:   Please see the history of present illness.    ROS All other systems reviewed and are negative.   PHYSICAL EXAM:   VS:  BP 118/68   Pulse 84   Ht 5' 7.5" (1.715 m)   Wt 184 lb 8 oz (83.7 kg)   SpO2 97%   BMI 28.47 kg/m    GEN: Well nourished, well developed, in no acute distress  HEENT: normal  Neck: no JVD, carotid bruits, or masses Cardiac: RRR; no murmurs, rubs, or gallops,no edema  Respiratory:  clear to auscultation bilaterally, normal work of breathing GI: soft, nontender, nondistended, + BS MS: no deformity or atrophy  Skin: warm and dry, no rash Neuro:  Alert and Oriented x 3, Strength and sensation are  intact Psych: euthymic mood, full affect  Wt Readings from Last 3 Encounters:  01/23/19 184 lb 8 oz (  83.7 kg)  11/10/18 187 lb (84.8 kg)  10/30/18 185 lb 6.4 oz (84.1 kg)      Studies/Labs Reviewed:   EKG:  EKG is ordered today.  The ekg ordered today demonstrates ventricularly paced rhythm  Recent Labs: 07/27/2018: ALT 22; Potassium 4.2; Sodium 141 08/04/2018: Hemoglobin 14.1; Platelets 184 08/17/2018: TSH 2.02 11/10/2018: BUN 17; Creat 0.93   Lipid Panel    Component Value Date/Time   CHOL 154 05/29/2018 0806   TRIG 52 05/29/2018 0806   HDL 60 05/29/2018 0806   CHOLHDL 2.6 05/29/2018 0806   CHOLHDL 2.7 03/19/2016 0922   VLDL 14 03/19/2016 0922   LDLCALC 84 05/29/2018 0806    Additional studies/ records that were reviewed today include:   Myoview 05/29/2018 Study Highlights     Nuclear stress EF: 51%.  This is a low risk study.  The left ventricular ejection fraction is mildly decreased (45-54%).   1. EF 51% with apical hypokinesis.  2. Medium-sized, moderate intensity basal to apical inferior and inferolateral perfusion defect.  This looks like prior infarction with no ischemia, though wall motion is normal in the inferior and lateral walls. .   Overall, I think this is a low risk study with no evidence for ischemia.       Echo 09/27/2018 LV EF: 50% -   55% Study Conclusions  - Left ventricle: The cavity size was normal. Wall thickness was   increased in a pattern of mild LVH. Systolic function was normal.   The estimated ejection fraction was in the range of 50% to 55%.   There is hypokinesis of the apical myocardium. Doppler parameters   are consistent with abnormal left ventricular relaxation (grade 1   diastolic dysfunction). - Aortic valve: Valve mobility was restricted. There was mild   stenosis. - Ascending aorta: The ascending aorta was mildly dilated. - Mitral valve: Calcified annulus. - Left atrium: The atrium was mildly  dilated.  Impressions:  - Apical hypokinesis (possibly related to pacing); overall   preserved LV function; mild diastolic dysfunction; mild LVH;   calcified aortic valve with mild AS by doppler (mean gradient 14   mmHg); mildly dilated ascending aorta; mild LAE.  ------------------------------------------------------------------- Labs, prior tests, procedures, and surgery: Permanent pacemaker system implantation.    ASSESSMENT:    1. Preop cardiovascular exam   2. Atherosclerosis of native coronary artery of native heart without angina pectoris   3. Essential hypertension, benign   4. Hyperlipidemia LDL goal <70   5. Paroxysmal atrial fibrillation ([name])   6. OSA on CPAP   7. Pulmonary sarcoidosis ([place])      PLAN:  In order of problems listed above:  1. Preoperative clearance: Requested by Dr. Brantley [name] of [facility] surgery prior to hernia repair.  Patient is able to complete at least 4 METS of activity without any issue.  He is cleared to proceed with surgery without any further work-up  2. CAD: Denies any recent chest discomfort.  Not on aspirin given the need for Eliquis.  On Lipitor  3. Hypertension: Blood pressure stable  4. Hyperlipidemia: Continue Lipitor 20 mg daily  5. Paroxysmal atrial fibrillation: On Eliquis, metoprolol and multaq.   6. Obstructive sleep apnea: On CPAP  7. Pulmonary sarcoidosis: Followed by pulmonology service.    Medication Adjustments/Labs and Tests Ordered: Current medicines are reviewed at length with the patient today.  Concerns regarding medicines are outlined above.  Medication changes, Labs and Tests ordered today are listed in the Patient Instructions  below. Patient Instructions  Medication Instructions:  Your physician recommends that you continue on your current medications as directed. Please refer to the Current Medication list given to you today.  If you need a refill on your cardiac medications before your next  appointment, please call your pharmacy.   Lab work: None ordered  If you have labs (blood work) drawn today and your tests are completely normal, you will receive your results only by: [name] MyChart Message (if you have MyChart) OR . A paper copy in the mail If you have any lab test that is abnormal or we need to change your treatment, we will call you to review the results.  Testing/Procedures: None ordered  Follow-Up: Your physician recommends that you schedule a follow-up appointment in: [place]    Any Other Special Instructions Will Be Listed Below (If Applicable).       Hilbert Corrigan, [place]  01/25/2019 10:58 PM    [facility] [address], [place], [place]  [address] Phone: [phone]; Fax: [phone]

## 2019-01-25 ENCOUNTER — Encounter: Payer: Self-pay | Admitting: Physician Assistant

## 2019-01-27 HISTORY — PX: HERNIA REPAIR: SHX51

## 2019-02-12 NOTE — Pre-Procedure Instructions (Addendum)
[facility]  02/12/2019      [place] ([place]), [place] [address] [place] ([place]) [place] [address] Phone: [phone] Fax: [phone]    Your procedure is scheduled on Wednesday 02/21/2019.  Report to [facility] Main Entrance "A" ([address] at [place].M.  Call this number if you have problems the morning of surgery:  [phone]    Call [phone] if you have any questions prior to your surgery date Monday-Friday 8am-4pm    Remember:  Do not eat after midnight Tuesday night before your surgery.   You may drink CLEAR LIQUIDS up until 0530 the morning of your surgery.  Clear liquids allowed are:  Water, Juice (non-citric and without pulp), Carbonated beverages, Clear Tea, Black Coffee only (No cream or milk) and Gatorade   Take these medicines the morning of surgery with A SIP OF WATER: Albuterol (Proventil; Ventolin) inhaler - if needed Amlodipine (Norvasc) Dorzolamide-timolol (Cosopt) eye drop Finasteride (Proscar) Fluticasone (Flonase) Fluticasone Propionate, Inhaler (Flovent Diskus) - if needed Metoprolol tartrate (Lopressor) Pantoprazole (Protonix)  Bring all inhalers with you to the hospital the morning of your surgery.  7 days prior to surgery STOP taking any Aspirin (unless otherwise instructed by your surgeon), Aleve, Naproxen, Ibuprofen, Motrin, Advil, Goody's, BC's, all herbal medications, fish oil, and all vitamins.  HOLD Eliquis for 2 days prior to surgery, per cardiology.       Do not wear jewelry.  Do not wear lotions, powders, or colognes, or deodorant.  Men may shave face and neck.  Do not bring valuables to the hospital.  [facility] is not responsible for any belongings or valuables.  Contacts, eyeglasses, hearing aids, dentures or bridgework may not be worn into surgery.  Leave your suitcase in the car.  After surgery it may be brought to your room.  For patients admitted to the  hospital, discharge time will be determined by your treatment team.  Patients discharged the day of surgery will not be allowed to drive home.   Name and phone number of your driver:    Special instructions:   [place]- Preparing For Surgery  Before surgery, you can play an important role. Because skin is not sterile, your skin needs to be as free of germs as possible. You can reduce the number of germs on your skin by washing with CHG (chlorahexidine gluconate) Soap before surgery.  CHG is an antiseptic cleaner which kills germs and bonds with the skin to continue killing germs even after washing.    Oral Hygiene is also important to reduce your risk of infection.  Remember - BRUSH YOUR TEETH THE MORNING OF SURGERY WITH YOUR REGULAR TOOTHPASTE  Please do not use if you have an allergy to CHG or antibacterial soaps. If your skin becomes reddened/irritated stop using the CHG.  Do not shave (including legs and underarms) for at least 48 hours prior to first CHG shower. It is OK to shave your face.  Please follow these instructions carefully.   1. Shower the NIGHT BEFORE SURGERY and the MORNING OF SURGERY with CHG.   2. If you chose to wash your hair, wash your hair first as usual with your normal shampoo.  3. After you shampoo, rinse your hair and body thoroughly to remove the shampoo.  4. Use CHG as you would any other liquid soap. You can apply CHG directly to the skin and wash gently with a scrungie or a  clean washcloth.   5. Apply the CHG Soap to your body ONLY FROM THE NECK DOWN.  Do not use on open wounds or open sores. Avoid contact with your eyes, ears, mouth and genitals (private parts). Wash Face and genitals (private parts)  with your normal soap.  6. Wash thoroughly, paying special attention to the area where your surgery will be performed.  7. Thoroughly rinse your body with warm water from the neck down.  8. DO NOT shower/wash with your normal soap after using and  rinsing off the CHG Soap.  9. Pat yourself dry with a CLEAN TOWEL.  10. Wear CLEAN PAJAMAS to bed the night before surgery, wear comfortable clothes the morning of surgery  11. Place CLEAN SHEETS on your bed the night of your first shower and DO NOT SLEEP WITH PETS.    Day of Surgery: Shower as stated above. Do not apply any deodorants/lotions.  Please wear clean clothes to the hospital/surgery center.   Remember to brush your teeth WITH YOUR REGULAR TOOTHPASTE.    Please read over the following fact sheets that you were given. Pain Booklet, Coughing and Deep Breathing and Surgical Site Infection Prevention

## 2019-02-13 ENCOUNTER — Other Ambulatory Visit: Payer: Self-pay

## 2019-02-13 ENCOUNTER — Encounter (HOSPITAL_COMMUNITY): Payer: Self-pay

## 2019-02-13 ENCOUNTER — Encounter (HOSPITAL_COMMUNITY)
Admission: RE | Admit: 2019-02-13 | Discharge: 2019-02-13 | Disposition: A | Discharge status: Home / Self Care | Payer: Medicare Other | Source: Ambulatory Visit | Attending: Surgery | Admitting: Surgery

## 2019-02-13 DIAGNOSIS — Z01812 Encounter for preprocedural laboratory examination: Secondary | ICD-10-CM | POA: Diagnosis not present

## 2019-02-13 HISTORY — DX: Anemia, unspecified: D64.9

## 2019-02-13 LAB — COMPREHENSIVE METABOLIC PANEL
ALT: 19 U/L (ref 0–44)
AST: 20 U/L (ref 15–41)
Albumin: 4.4 g/dL (ref 3.5–5.0)
Alkaline Phosphatase: 44 U/L (ref 38–126)
Anion gap: 11 (ref 5–15)
BUN: 13 mg/dL (ref 8–23)
CO2: 27 mmol/L (ref 22–32)
Calcium: 10.5 mg/dL — ABNORMAL HIGH (ref 8.9–10.3)
Chloride: 100 mmol/L (ref 98–111)
Creatinine, Ser: 0.97 mg/dL (ref 0.61–1.24)
GFR calc Af Amer: >60 mL/min (ref >60)
GFR calc non Af Amer: >60 mL/min (ref >60)
Glucose, Bld: 107 mg/dL — ABNORMAL HIGH (ref 70–99)
Potassium: 3.8 mmol/L (ref 3.5–5.1)
Sodium: 138 mmol/L (ref 135–145)
Total Bilirubin: 1.1 mg/dL (ref 0.3–1.2)
Total Protein: 7.4 g/dL (ref 6.5–8.1)

## 2019-02-13 LAB — CBC WITH DIFFERENTIAL/PLATELET
Abs Immature Granulocytes: 0.02 10*3/uL (ref 0.00–0.07)
Basophils Absolute: 0.1 10*3/uL (ref 0.0–0.1)
Basophils Relative: 1 %
Eosinophils Absolute: 0.1 10*3/uL (ref 0.0–0.5)
Eosinophils Relative: 3 %
HCT: 42 % (ref 39.0–52.0)
Hemoglobin: 14.3 g/dL (ref 13.0–17.0)
Immature Granulocytes: 0 %
Lymphocytes Relative: 22 %
Lymphs Abs: 1 10*3/uL (ref 0.7–4.0)
MCH: 31.3 pg (ref 26.0–34.0)
MCHC: 34 g/dL (ref 30.0–36.0)
MCV: 91.9 fL (ref 80.0–100.0)
Monocytes Absolute: 0.5 10*3/uL (ref 0.1–1.0)
Monocytes Relative: 12 %
Neutro Abs: 2.8 10*3/uL (ref 1.7–7.7)
Neutrophils Relative %: 62 %
Platelets: 191 10*3/uL (ref 150–400)
RBC: 4.57 MIL/uL (ref 4.22–5.81)
RDW: 13.1 % (ref 11.5–15.5)
WBC: 4.5 10*3/uL (ref 4.0–10.5)
nRBC: 0 % (ref 0.0–0.2)

## 2019-02-13 NOTE — Progress Notes (Signed)
PCP: Dr. [name] Cardiologist: Dr. Burt Knack EP: Dr. Curt Bears Pulmonologist: no longer needs to sees, per pt: sarcoidosis resolved but "still has scar tissue"  EKG: 01/23/19 CXR: n/a ECHO: 05/2018 Stress Test: 05/2018 Cardiac Cath: 2016 Sleep Study 11/2017--wears CPAP nightly  Per Cardiology: Hold Eliquis 2 days  Medtronic pacemaker: faxed to [facility] [facility], [employer] reps emailed  Patient denies shortness of breath, fever, cough, and chest pain at PAT appointment.  Patient verbalized understanding of instructions provided today at the PAT appointment.  Patient asked to review instructions at home and day of surgery.   Chart forwarded to anesthesia

## 2019-02-13 NOTE — Pre-Procedure Instructions (Signed)
Derrick [name]  02/13/2019     Your procedure is scheduled on Wednesday 02/21/2019. Report to [facility] Main Entrance "A" ([address]) at [place].M.  Call this number if you have problems the morning of surgery:  [phone]    Call [phone] if you have any questions prior to your surgery date Monday-Friday 8am-4pm    Remember:  Do not eat after midnight Tuesday night before your surgery.   You may drink CLEAR LIQUIDS up until 0530 the morning of your surgery.  Clear liquids allowed are:  Water, Juice (non-citric and without pulp), Carbonated beverages, Clear Tea, Black Coffee only (No cream or milk) and Gatorade   Take these medicines the morning of surgery with A SIP OF WATER: Albuterol (Proventil; Ventolin) inhaler - if needed Amlodipine (Norvasc) Dorzolamide-timolol (Cosopt) eye drop Finasteride (Proscar) Fluticasone (Flonase) Fluticasone Propionate, Inhaler (Flovent Diskus) - if needed Metoprolol tartrate (Lopressor) Pantoprazole (Protonix) Multaq  Bring all inhalers with you to the hospital the morning of your surgery.  7 days prior to surgery STOP taking any Aspirin (unless otherwise instructed by your surgeon), Aleve, Naproxen, Ibuprofen, Motrin, Advil, Goody's, BC's, all herbal medications, fish oil, and all vitamins.  HOLD Eliquis for 2 days prior to surgery, per cardiology.       Do not wear jewelry.  Do not wear lotions, powders, or colognes, or deodorant.  Men may shave face and neck.  Do not bring valuables to the hospital.  [facility] is not responsible for any belongings or valuables.  Contacts, eyeglasses, hearing aids, dentures or bridgework may not be worn into surgery.  Leave your suitcase in the car.  After surgery it may be brought to your room.  For patients admitted to the hospital, discharge time will be determined by your treatment team.  Patients discharged the day of surgery will not be allowed to drive home.   Name and  phone number of your driver:    Special instructions:   [place]- Preparing For Surgery  Before surgery, you can play an important role. Because skin is not sterile, your skin needs to be as free of germs as possible. You can reduce the number of germs on your skin by washing with CHG (chlorahexidine gluconate) Soap before surgery.  CHG is an antiseptic cleaner which kills germs and bonds with the skin to continue killing germs even after washing.    Oral Hygiene is also important to reduce your risk of infection.  Remember - BRUSH YOUR TEETH THE MORNING OF SURGERY WITH YOUR REGULAR TOOTHPASTE  Please do not use if you have an allergy to CHG or antibacterial soaps. If your skin becomes reddened/irritated stop using the CHG.  Do not shave (including legs and underarms) for at least 48 hours prior to first CHG shower. It is OK to shave your face.  Please follow these instructions carefully.   1. Shower the NIGHT BEFORE SURGERY and the MORNING OF SURGERY with CHG.   2. If you chose to wash your hair, wash your hair first as usual with your normal shampoo.  3. After you shampoo, rinse your hair and body thoroughly to remove the shampoo.  4. Use CHG as you would any other liquid soap. You can apply CHG directly to the skin and wash gently with a scrungie or a clean washcloth.   5. Apply the CHG Soap to your body ONLY FROM THE NECK DOWN.  Do not use on open wounds or open sores. Avoid  contact with your eyes, ears, mouth and genitals (private parts). Wash Face and genitals (private parts)  with your normal soap.  6. Wash thoroughly, paying special attention to the area where your surgery will be performed.  7. Thoroughly rinse your body with warm water from the neck down.  8. DO NOT shower/wash with your normal soap after using and rinsing off the CHG Soap.  9. Pat yourself dry with a CLEAN TOWEL.  10. Wear CLEAN PAJAMAS to bed the night before surgery, wear comfortable clothes the morning  of surgery  11. Place CLEAN SHEETS on your bed the night of your first shower and DO NOT SLEEP WITH PETS.    Day of Surgery: Shower as stated above. Do not apply any deodorants/lotions.  Please wear clean clothes to the hospital/surgery center.   Remember to brush your teeth WITH YOUR REGULAR TOOTHPASTE.    Please read over the following fact sheets that you were given. Pain Booklet, Coughing and Deep Breathing and Surgical Site Infection Prevention

## 2019-02-14 NOTE — Progress Notes (Signed)
Anesthesia Chart Review:  Case:  [id number] Date/Time:  02/21/19 0815   Procedure:  LEFT HERNIA REPAIR INGUINAL WITH MESH (Left )   Anesthesia type:  General   Pre-op diagnosis:  left inguinal hernia   Location:  [facility] OR ROOM 19 / [place] [facility]   Surgeon:  [name], MD      DISCUSSION: 78 yo male never smoker. Pertinent hx includes CAD, PAF, chronic inflammatory demyelinating polyneuropathy, hyperlipidemia, hypertension, obstructive sleep apnea on CPAP, pulmonary sarcoidosis (pt reports disease not significant enough that he needs to follow with pulm) and monoclonal gammopathy of undetermined significance.  Patient had an inferior wall MI and underwent DES to RCA in 2012.  He also had moderate left main disease 50 to 60% with FFR of 0.7, 40 to 50% proximal to mid LAD disease, 30% D1, 50% proximal left circumflex, 80% RPDA lesion.  FFR was felt to be negative therefore medical therapy was recommended.  He underwent permanent pacemaker placement in 2017 for paroxysmal atrial fibrillation with bradycardic episode.  Myoview performed on 05/29/2018 showed EF 51%, medium sized moderate intensity basal to apical inferior and inferolateral perfusion defect consistent with prior MI with no ischemia, overall low risk study. Echocardiogram obtained on 09/27/2018 showed EF 50 to 55%, hypokinesis of the apical myocardium, grade 1 DD, mild aortic stenosis, mildly dilated ascending aorta.  Last device interrogation on 12/26/2018 showed stable battery status, 26 beats of nonsustained VT, metoprolol 25 mg twice daily was started.  Pt seen by Almyra Deforest, PA-C 01/23/19 for preop clearance. Per his note: "Patient presents today for preoperative clearance prior to L hernia repair by Dr. Brantley Stage of [facility] surgery.  He says he continues to ambulate for 2 miles frequently with his wife.  He denies any recent exertional chest pain or shortness of breath.  He is previous anginal was pain between the shoulder blades, he has not  noticed any similar symptoms recently.  He has no lower extremity edema, orthopnea or PND.  He is tolerating the added dose of metoprolol without any issue.  He is cleared to proceed with surgery from cardiology perspective given reassuring symptom and stress test within the past year.  His RCRI perioperative risk is class III 6.6% major cardiac risk during procedure.  He has been instructed to hold Eliquis for 2 days prior to the surgery and restart Eliquis as soon as possible after the surgery at the discretion of the surgeon."  Anticipate he can proceed as planned barring acute status change.   VS: BP 134/65   Pulse 68   Temp 36.6 C (Oral)   Ht 5\' 10"  (1.778 m)   Wt 83.8 kg   SpO2 99%   BMI 26.52 kg/m   PROVIDERS: [name], MD is PCP  [name], MD is Cardiologist  Curt Bears, Will, MD is EP Cardiologist  LABS: Labs reviewed: Acceptable for surgery. (all labs ordered are listed, but only abnormal results are displayed)  Labs Reviewed  COMPREHENSIVE METABOLIC PANEL - Abnormal; Notable for the following components:      Result Value   Glucose, Bld 107 (*)    Calcium 10.5 (*)    All other components within normal limits  CBC WITH DIFFERENTIAL/PLATELET     IMAGES: CT Abd/Pelvis 11/13/2018: IMPRESSION: 1. No acute findings within the abdomen or pelvis. 2.  Aortic Atherosclerosis (ICD10-I70.0). 3. Nonobstructing right renal calculus. 4. Prostate gland enlargement.  EKG: 01/23/2019: V-paced rhythm. Rate 70.   CV: Myoview 05/29/2018   Nuclear stress  EF: 51%.  This is a low risk study.  The left ventricular ejection fraction is mildly decreased (45-54%).  1. EF 51% with apical hypokinesis.  2. Medium-sized, moderate intensity basal to apical inferior and inferolateral perfusion defect. This looks like prior infarction with no ischemia, though wall motion is normal in the inferior and lateral walls. .   Overall, I think this is a low risk study with no  evidence for ischemia.     Echo 09/27/2018 Study Conclusions - Left ventricle: The cavity size was normal. Wall thickness was increased in a pattern of mild LVH. Systolic function was normal. The estimated ejection fraction was in the range of 50% to 55%. There is hypokinesis of the apical myocardium. Doppler parameters are consistent with abnormal left ventricular relaxation (grade 1 diastolic dysfunction). - Aortic valve: Valve mobility was restricted. There was mild stenosis. - Ascending aorta: The ascending aorta was mildly dilated. - Mitral valve: Calcified annulus. - Left atrium: The atrium was mildly dilated.  Impressions:  - Apical hypokinesis (possibly related to pacing); overall preserved LV function; mild diastolic dysfunction; mild LVH; calcified aortic valve with mild AS by doppler (mean gradient 14 mmHg); mildly dilated ascending aorta; mild LAE   Past Medical History:  Diagnosis Date  . Anemia   . BENIGN PROSTATIC HYPERTROPHY, WITH OBSTRUCTION 05/28/2010  . CAD (coronary artery disease)    a. s/p NSTEMI 06/01/11: DES to RCA;  b. cath 06/25/11:   dLM 50-60% (FFR 0.87), prox to mid LAD 40-50%, D1 50%, pCFX 50%, RCA stent ok, dPDA 80%, EF 55-60%.  His FFR was felt to be negative and therefore medical therapy was recommended ;  echo 6/12: EF 55-60%, mild AS   . Chronic back pain   . CIDP (chronic inflammatory demyelinating polyneuropathy) ([place]) 04/11/2012  . Colon cancer ([place]) dx'd 2000   "left"  . COLONIC POLYPS, ADENOMATOUS, HX OF 05/28/2010  . Complication of anesthesia    "stopped breathing; related to my sleep apnea" & urinary retention   . COPD (chronic obstructive pulmonary disease) ([place])    "associated w/lung sarcoidosis"  . 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 DISH (diffuse idiopathic skeletal hyperostosis) 12/02/2015  . Diverticulitis   . GENERALIZED OSTEOARTHROSIS UNSPECIFIED SITE 05/28/2010  . GERD 05/28/2010  . Heart murmur   . History  of gout   . HYPERLIPIDEMIA 05/28/2010  . Hypertension   . IgM lambda paraproteinemia   . 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 Malignant neoplasm of descending colon ([place]) 05/28/2010  . MITRAL VALVE PROLAPSE 05/28/2010  . 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 NSTEMI (non-ST elevated myocardial infarction) ([place]) 06/03/11  . 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  . OSA on CPAP 05/28/2010  . PALPITATIONS, CHRONIC 05/28/2010  . Parotid gland pain 2010   infection  . Peripheral neuropathy    "tx'd w/targeted chemo" (05/12/2016)  . Pneumonia 2-3 times  . Presence of permanent cardiac pacemaker   . 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 Sarcoidosis    pulmonalis  . UNSPECIFIED INFLAMMATORY AND TOXIC NEUROPATHY 05/28/2010  . Vision loss   . VITAMIN D DEFICIENCY 05/28/2010    Past Surgical History:  Procedure Laterality Date  . APPENDECTOMY  1954  . CATARACT EXTRACTION W/ INTRAOCULAR LENS IMPLANT Left   . COLON SURGERY  2000   decending colon   . CORONARY ANGIOPLASTY WITH STENT PLACEMENT  06/03/11  . ELECTROPHYSIOLOGIC STUDY N/A 05/12/2016   Procedure: Cardioversion;  Surgeon: Will [employer],  MD;  Location: [place] [facility];  Service: Cardiovascular;  Laterality: N/A;  . EP IMPLANTABLE DEVICE N/A 05/12/2016   Procedure: Pacemaker Implant;  Surgeon: [name], MD;  Location: [place] [facility];  Service: Cardiovascular;  Laterality: N/A;  . EYE SURGERY    . INSERT / REPLACE / REMOVE PACEMAKER  05/12/2016  . KNEE ARTHROSCOPY Right 2003  . LEFT HEART CATHETERIZATION WITH CORONARY ANGIOGRAM N/A 03/31/2015   Procedure: LEFT HEART CATHETERIZATION WITH CORONARY ANGIOGRAM;  Surgeon: [name], MD;  Location: [facility] CATH LAB;  Service: Cardiovascular;  Laterality: N/A;  . LUNG SURGERY  2001   "open lung dissection"  . MOHS SURGERY Left ~ 2008   ear  . PROSTATE BIOPSY  2010  . TONSILLECTOMY  1950    MEDICATIONS: . albuterol  (PROVENTIL HFA;VENTOLIN HFA) 108 (90 Base) MCG/ACT inhaler  . amLODipine (NORVASC) 10 MG tablet  . atorvastatin (LIPITOR) 20 MG tablet  . bimatoprost (LUMIGAN) 0.01 % SOLN  . Cholecalciferol 25 MCG (1000 UT) tablet  . dorzolamide-timolol (COSOPT) 22.3-6.8 MG/ML ophthalmic solution  . ELIQUIS 5 MG TABS tablet  . finasteride (PROSCAR) 5 MG tablet  . fluticasone (FLONASE) 50 MCG/ACT nasal spray  . Fluticasone Propionate, Inhal, (FLOVENT DISKUS) 100 MCG/BLIST AEPB  . metoprolol tartrate (LOPRESSOR) 25 MG tablet  . MULTAQ 400 MG tablet  . pantoprazole (PROTONIX) 40 MG tablet  . tadalafil (CIALIS) 5 MG tablet  . triamterene-hydrochlorothiazide (MAXZIDE-25) 37.5-25 MG tablet   . 0.9 %  sodium chloride infusion    Wynonia Musty [facility] Short Stay Center/Anesthesiology Phone [phone] 02/14/2019 3:26 PM

## 2019-02-20 MED ORDER — CEFAZOLIN SODIUM-DEXTROSE 2-4 GM/100ML-% IV SOLN
2.0000 g | INTRAVENOUS | Status: AC
  Administered 2019-02-21: 2 g via INTRAVENOUS
  Filled 2019-02-20: qty 100

## 2019-02-20 NOTE — Anesthesia Preprocedure Evaluation (Addendum)
Anesthesia Evaluation  Patient identified by MRN, date of birth, ID band Patient awake    Reviewed: Allergy & Precautions, NPO status , Patient's Chart, lab work & pertinent test results  History of Anesthesia Complications History of anesthetic complications: Postop OSA and urinary retention per patient.  Airway Mallampati: III  TM Distance: >3 FB Neck ROM: Full    Dental  (+) Teeth Intact, Dental Advisory Given   Pulmonary asthma , sleep apnea and Continuous Positive Airway Pressure Ventilation , COPD,  COPD inhaler,  Sarcoidosis   Pulmonary exam normal breath sounds clear to auscultation       Cardiovascular hypertension, Pt. on medications and Pt. on home beta blockers + CAD, + Past MI (2012) and + Cardiac Stents (2012)  Normal cardiovascular exam+ dysrhythmias (NSVT) Atrial Fibrillation + pacemaker + Valvular Problems/Murmurs MVP  Rhythm:Regular Rate:Normal  TTE 10/19: mild LVH, EF 50-55%, hypokinesis of the apical myocardium, grade 1 diastolic dysfunction, AS, mild LAE     Neuro/Psych Diffuse axonal neuropathy negative neurological ROS     GI/Hepatic Neg liver ROS, GERD  Medicated and Controlled,  Endo/Other  negative endocrine ROS  Renal/GU negative Renal ROS     Musculoskeletal  (+) Arthritis ,   Abdominal   Peds  Hematology MGUS   Anesthesia Other Findings Day of surgery medications reviewed with the patient.  Reproductive/Obstetrics                           Anesthesia Physical Anesthesia Plan  ASA: III  Anesthesia Plan: General   Post-op Pain Management:  Regional for Post-op pain   Induction: Intravenous  PONV Risk Score and Plan: 2 and Treatment may vary due to age or medical condition, Ondansetron and Dexamethasone  Airway Management Planned: Oral ETT  Additional Equipment:   Intra-op Plan:   Post-operative Plan: Extubation in OR  Informed Consent: I have  reviewed the patients History and Physical, chart, labs and discussed the procedure including the risks, benefits and alternatives for the proposed anesthesia with the patient or authorized representative who has indicated his/her understanding and acceptance.     Dental advisory given  Plan Discussed with: CRNA  Anesthesia Plan Comments:        Anesthesia Quick Evaluation

## 2019-02-21 ENCOUNTER — Other Ambulatory Visit: Payer: Self-pay

## 2019-02-21 ENCOUNTER — Ambulatory Visit (HOSPITAL_COMMUNITY)
Admission: RE | Admit: 2019-02-21 | Discharge: 2019-02-21 | Disposition: A | Discharge status: Home / Self Care | Payer: Medicare Other | Attending: Surgery | Admitting: Surgery

## 2019-02-21 ENCOUNTER — Ambulatory Visit (HOSPITAL_COMMUNITY): Payer: Medicare Other | Admitting: Physician Assistant

## 2019-02-21 ENCOUNTER — Encounter (HOSPITAL_COMMUNITY)
Admission: RE | Disposition: A | Discharge status: Home / Self Care | Payer: Self-pay | Source: Home / Self Care | Attending: Surgery

## 2019-02-21 ENCOUNTER — Ambulatory Visit (HOSPITAL_COMMUNITY): Payer: Medicare Other | Admitting: Anesthesiology

## 2019-02-21 ENCOUNTER — Encounter (HOSPITAL_COMMUNITY): Payer: Self-pay | Admitting: Surgery

## 2019-02-21 DIAGNOSIS — E7800 Pure hypercholesterolemia, unspecified: Secondary | ICD-10-CM | POA: Insufficient documentation

## 2019-02-21 DIAGNOSIS — K219 Gastro-esophageal reflux disease without esophagitis: Secondary | ICD-10-CM | POA: Diagnosis not present

## 2019-02-21 DIAGNOSIS — Z79899 Other long term (current) drug therapy: Secondary | ICD-10-CM | POA: Diagnosis not present

## 2019-02-21 DIAGNOSIS — Z8349 Family history of other endocrine, nutritional and metabolic diseases: Secondary | ICD-10-CM | POA: Insufficient documentation

## 2019-02-21 DIAGNOSIS — Z950 Presence of cardiac pacemaker: Secondary | ICD-10-CM | POA: Diagnosis not present

## 2019-02-21 DIAGNOSIS — Z8249 Family history of ischemic heart disease and other diseases of the circulatory system: Secondary | ICD-10-CM | POA: Diagnosis not present

## 2019-02-21 DIAGNOSIS — Z86010 Personal history of colonic polyps: Secondary | ICD-10-CM | POA: Insufficient documentation

## 2019-02-21 DIAGNOSIS — I4891 Unspecified atrial fibrillation: Secondary | ICD-10-CM | POA: Diagnosis not present

## 2019-02-21 DIAGNOSIS — I10 Essential (primary) hypertension: Secondary | ICD-10-CM | POA: Diagnosis not present

## 2019-02-21 DIAGNOSIS — G4730 Sleep apnea, unspecified: Secondary | ICD-10-CM | POA: Insufficient documentation

## 2019-02-21 DIAGNOSIS — G629 Polyneuropathy, unspecified: Secondary | ICD-10-CM | POA: Diagnosis not present

## 2019-02-21 DIAGNOSIS — D472 Monoclonal gammopathy: Secondary | ICD-10-CM | POA: Insufficient documentation

## 2019-02-21 DIAGNOSIS — I110 Hypertensive heart disease with heart failure: Secondary | ICD-10-CM | POA: Diagnosis not present

## 2019-02-21 DIAGNOSIS — I2510 Atherosclerotic heart disease of native coronary artery without angina pectoris: Secondary | ICD-10-CM | POA: Diagnosis not present

## 2019-02-21 DIAGNOSIS — N400 Enlarged prostate without lower urinary tract symptoms: Secondary | ICD-10-CM | POA: Diagnosis not present

## 2019-02-21 DIAGNOSIS — Z833 Family history of diabetes mellitus: Secondary | ICD-10-CM | POA: Insufficient documentation

## 2019-02-21 DIAGNOSIS — K4090 Unilateral inguinal hernia, without obstruction or gangrene, not specified as recurrent: Secondary | ICD-10-CM | POA: Insufficient documentation

## 2019-02-21 DIAGNOSIS — G8918 Other acute postprocedural pain: Secondary | ICD-10-CM | POA: Diagnosis not present

## 2019-02-21 DIAGNOSIS — M1990 Unspecified osteoarthritis, unspecified site: Secondary | ICD-10-CM | POA: Diagnosis not present

## 2019-02-21 DIAGNOSIS — Z841 Family history of disorders of kidney and ureter: Secondary | ICD-10-CM | POA: Diagnosis not present

## 2019-02-21 DIAGNOSIS — Z7901 Long term (current) use of anticoagulants: Secondary | ICD-10-CM | POA: Diagnosis not present

## 2019-02-21 DIAGNOSIS — I252 Old myocardial infarction: Secondary | ICD-10-CM | POA: Insufficient documentation

## 2019-02-21 DIAGNOSIS — J449 Chronic obstructive pulmonary disease, unspecified: Secondary | ICD-10-CM | POA: Insufficient documentation

## 2019-02-21 DIAGNOSIS — D869 Sarcoidosis, unspecified: Secondary | ICD-10-CM | POA: Insufficient documentation

## 2019-02-21 DIAGNOSIS — I509 Heart failure, unspecified: Secondary | ICD-10-CM | POA: Diagnosis not present

## 2019-02-21 DIAGNOSIS — Z955 Presence of coronary angioplasty implant and graft: Secondary | ICD-10-CM | POA: Diagnosis not present

## 2019-02-21 HISTORY — PX: INGUINAL HERNIA REPAIR: SHX194

## 2019-02-21 LAB — PROTIME-INR
INR: 1.1 (ref 0.8–1.2)
Prothrombin Time: 14.4 seconds (ref 11.4–15.2)

## 2019-02-21 SURGERY — REPAIR, HERNIA, INGUINAL, ADULT
Anesthesia: General | Site: Groin | Laterality: Left

## 2019-02-21 MED ORDER — BUPIVACAINE-EPINEPHRINE (PF) 0.5% -1:200000 IJ SOLN
INTRAMUSCULAR | Status: DC | PRN
  Administered 2019-02-21: 30 mL via PERINEURAL

## 2019-02-21 MED ORDER — HYDROCODONE-ACETAMINOPHEN 5-325 MG PO TABS
ORAL_TABLET | ORAL | Status: AC
  Filled 2019-02-21: qty 1

## 2019-02-21 MED ORDER — FENTANYL CITRATE (PF) 250 MCG/5ML IJ SOLN
INTRAMUSCULAR | Status: AC
  Filled 2019-02-21: qty 5

## 2019-02-21 MED ORDER — ROCURONIUM BROMIDE 10 MG/ML (PF) SYRINGE
PREFILLED_SYRINGE | INTRAVENOUS | Status: DC | PRN
  Administered 2019-02-21: 40 mg via INTRAVENOUS
  Administered 2019-02-21: 20 mg via INTRAVENOUS

## 2019-02-21 MED ORDER — MIDAZOLAM HCL 2 MG/2ML IJ SOLN
1.0000 mg | Freq: Once | INTRAMUSCULAR | Status: AC
  Administered 2019-02-21: 1 mg via INTRAVENOUS

## 2019-02-21 MED ORDER — ROCURONIUM BROMIDE 50 MG/5ML IV SOSY
PREFILLED_SYRINGE | INTRAVENOUS | Status: AC
  Filled 2019-02-21: qty 5

## 2019-02-21 MED ORDER — LACTATED RINGERS IV SOLN
INTRAVENOUS | Status: DC
  Administered 2019-02-21 (×2): via INTRAVENOUS

## 2019-02-21 MED ORDER — ONDANSETRON HCL 4 MG/2ML IJ SOLN
INTRAMUSCULAR | Status: DC | PRN
  Administered 2019-02-21: 4 mg via INTRAVENOUS

## 2019-02-21 MED ORDER — CELECOXIB 200 MG PO CAPS
400.0000 mg | ORAL_CAPSULE | ORAL | Status: AC
  Administered 2019-02-21: 400 mg via ORAL
  Filled 2019-02-21: qty 2

## 2019-02-21 MED ORDER — LIDOCAINE 2% (20 MG/ML) 5 ML SYRINGE
INTRAMUSCULAR | Status: DC | PRN
  Administered 2019-02-21: 80 mg via INTRAVENOUS

## 2019-02-21 MED ORDER — FENTANYL CITRATE (PF) 250 MCG/5ML IJ SOLN
INTRAMUSCULAR | Status: DC | PRN
  Administered 2019-02-21: 100 ug via INTRAVENOUS

## 2019-02-21 MED ORDER — ACETAMINOPHEN 500 MG PO TABS
1000.0000 mg | ORAL_TABLET | ORAL | Status: AC
  Administered 2019-02-21: 1000 mg via ORAL
  Filled 2019-02-21: qty 2

## 2019-02-21 MED ORDER — SUGAMMADEX SODIUM 200 MG/2ML IV SOLN
INTRAVENOUS | Status: DC | PRN
  Administered 2019-02-21: 180 mg via INTRAVENOUS
  Administered 2019-02-21: 150 mg via INTRAVENOUS

## 2019-02-21 MED ORDER — CHLORHEXIDINE GLUCONATE CLOTH 2 % EX PADS: 6.0000 | MEDICATED_PAD | Freq: Once | CUTANEOUS | Status: DC

## 2019-02-21 MED ORDER — BUPIVACAINE HCL (PF) 0.25 % IJ SOLN
INTRAMUSCULAR | Status: AC
  Filled 2019-02-21: qty 30

## 2019-02-21 MED ORDER — FENTANYL CITRATE (PF) 100 MCG/2ML IJ SOLN
INTRAMUSCULAR | Status: AC
  Administered 2019-02-21: 50 ug via INTRAVENOUS
  Filled 2019-02-21: qty 2

## 2019-02-21 MED ORDER — PROPOFOL 10 MG/ML IV BOLUS
INTRAVENOUS | Status: AC
  Filled 2019-02-21: qty 20

## 2019-02-21 MED ORDER — HYDROCODONE-ACETAMINOPHEN 5-325 MG PO TABS: 1.0000 | ORAL_TABLET | Freq: Four times a day (QID) | ORAL | 0 refills | Status: DC | PRN

## 2019-02-21 MED ORDER — GABAPENTIN 300 MG PO CAPS
300.0000 mg | ORAL_CAPSULE | ORAL | Status: AC
  Administered 2019-02-21: 300 mg via ORAL
  Filled 2019-02-21: qty 1

## 2019-02-21 MED ORDER — HYDROCODONE-ACETAMINOPHEN 5-325 MG PO TABS
1.0000 | ORAL_TABLET | Freq: Four times a day (QID) | ORAL | Status: DC | PRN
  Administered 2019-02-21: 1 via ORAL

## 2019-02-21 MED ORDER — BUPIVACAINE-EPINEPHRINE 0.25% -1:200000 IJ SOLN
INTRAMUSCULAR | Status: DC | PRN
  Administered 2019-02-21: 10 mL

## 2019-02-21 MED ORDER — ONDANSETRON HCL 4 MG/2ML IJ SOLN
INTRAMUSCULAR | Status: AC
  Filled 2019-02-21: qty 2

## 2019-02-21 MED ORDER — DEXAMETHASONE SODIUM PHOSPHATE 10 MG/ML IJ SOLN
INTRAMUSCULAR | Status: AC
  Filled 2019-02-21: qty 1

## 2019-02-21 MED ORDER — MIDAZOLAM HCL 2 MG/2ML IJ SOLN
INTRAMUSCULAR | Status: AC
  Administered 2019-02-21: 1 mg via INTRAVENOUS
  Filled 2019-02-21: qty 2

## 2019-02-21 MED ORDER — 0.9 % SODIUM CHLORIDE (POUR BTL) OPTIME
TOPICAL | Status: DC | PRN
  Administered 2019-02-21: 1000 mL

## 2019-02-21 MED ORDER — FENTANYL CITRATE (PF) 100 MCG/2ML IJ SOLN
50.0000 ug | Freq: Once | INTRAMUSCULAR | Status: AC
  Administered 2019-02-21: 50 ug via INTRAVENOUS

## 2019-02-21 MED ORDER — PROPOFOL 10 MG/ML IV BOLUS
INTRAVENOUS | Status: DC | PRN
  Administered 2019-02-21: 150 mg via INTRAVENOUS

## 2019-02-21 MED ORDER — LIDOCAINE 2% (20 MG/ML) 5 ML SYRINGE
INTRAMUSCULAR | Status: AC
  Filled 2019-02-21: qty 5

## 2019-02-21 MED ORDER — DEXAMETHASONE SODIUM PHOSPHATE 10 MG/ML IJ SOLN
INTRAMUSCULAR | Status: DC | PRN
  Administered 2019-02-21: 4 mg via INTRAVENOUS

## 2019-02-21 SURGICAL SUPPLY — 40 items
BLADE CLIPPER SURG (BLADE) IMPLANT
CANISTER SUCT 3000ML PPV (MISCELLANEOUS) IMPLANT
CHLORAPREP W/TINT 26ML (MISCELLANEOUS) ×2 IMPLANT
COVER SURGICAL LIGHT HANDLE (MISCELLANEOUS) ×2 IMPLANT
COVER WAND RF STERILE (DRAPES) ×2 IMPLANT
DERMABOND ADHESIVE PROPEN (GAUZE/BANDAGES/DRESSINGS) ×1
DERMABOND ADVANCED (GAUZE/BANDAGES/DRESSINGS) ×1
DERMABOND ADVANCED .7 DNX12 (GAUZE/BANDAGES/DRESSINGS) ×1 IMPLANT
DERMABOND ADVANCED .7 DNX6 (GAUZE/BANDAGES/DRESSINGS) ×1 IMPLANT
DRAIN PENROSE 1/2X12 LTX STRL (WOUND CARE) IMPLANT
DRAPE LAPAROTOMY TRNSV 102X78 (DRAPE) ×2 IMPLANT
ELECT REM PT RETURN 9FT ADLT (ELECTROSURGICAL) ×2
ELECTRODE REM PT RTRN 9FT ADLT (ELECTROSURGICAL) ×1 IMPLANT
GLOVE BIO SURGEON STRL SZ8 (GLOVE) ×2 IMPLANT
GLOVE BIOGEL PI IND STRL 8 (GLOVE) ×1 IMPLANT
GLOVE BIOGEL PI INDICATOR 8 (GLOVE) ×1
GOWN STRL REUS W/ TWL LRG LVL3 (GOWN DISPOSABLE) ×1 IMPLANT
GOWN STRL REUS W/ TWL XL LVL3 (GOWN DISPOSABLE) ×1 IMPLANT
GOWN STRL REUS W/TWL LRG LVL3 (GOWN DISPOSABLE) ×1
GOWN STRL REUS W/TWL XL LVL3 (GOWN DISPOSABLE) ×1
KIT BASIN OR (CUSTOM PROCEDURE TRAY) ×2 IMPLANT
KIT TURNOVER KIT B (KITS) ×2 IMPLANT
MESH HERNIA SYS ULTRAPRO LRG (Mesh General) ×2 IMPLANT
NEEDLE HYPO 25GX1X1/2 BEV (NEEDLE) ×2 IMPLANT
NS IRRIG 1000ML POUR BTL (IV SOLUTION) ×2 IMPLANT
PACK GENERAL/GYN (CUSTOM PROCEDURE TRAY) ×2 IMPLANT
PAD ARMBOARD 7.5X6 YLW CONV (MISCELLANEOUS) ×2 IMPLANT
PENCIL SMOKE EVACUATOR (MISCELLANEOUS) ×2 IMPLANT
SUT MNCRL AB 4-0 PS2 18 (SUTURE) ×2 IMPLANT
SUT NOVA NAB DX-16 0-1 5-0 T12 (SUTURE) ×2 IMPLANT
SUT SILK 2 0 SH (SUTURE) IMPLANT
SUT VIC AB 0 CT1 27 (SUTURE)
SUT VIC AB 0 CT1 27XBRD ANBCTR (SUTURE) IMPLANT
SUT VIC AB 2-0 SH 27 (SUTURE) ×1
SUT VIC AB 2-0 SH 27X BRD (SUTURE) ×1 IMPLANT
SUT VIC AB 3-0 SH 18 (SUTURE) ×2 IMPLANT
SUT VICRYL AB 3 0 TIES (SUTURE) ×2 IMPLANT
SYR CONTROL 10ML LL (SYRINGE) ×2 IMPLANT
TOWEL OR 17X24 6PK STRL BLUE (TOWEL DISPOSABLE) ×2 IMPLANT
TOWEL OR 17X26 10 PK STRL BLUE (TOWEL DISPOSABLE) ×2 IMPLANT

## 2019-02-21 NOTE — Transfer of Care (Signed)
Immediate Anesthesia Transfer of Care Note  Patient: Derrick [name]  Procedure(s) Performed: LEFT INGUINAL HERNIA REPAIR WITH MESH (Left Groin)  Patient Location: PACU  Anesthesia Type:General  Level of Consciousness: drowsy  Airway & Oxygen Therapy: Patient Spontanous Breathing and Patient connected to face mask oxygen  Post-op Assessment: Report given to RN and Post -op Vital signs reviewed and stable  Post vital signs: Reviewed and stable  Last Vitals:  Vitals Value Taken Time  BP 124/79 02/21/2019 10:03 AM  Temp    Pulse 81 02/21/2019 10:04 AM  Resp 16 02/21/2019 10:04 AM  SpO2 100 % 02/21/2019 10:04 AM  Vitals shown include unvalidated device data.  Last Pain:  Vitals:   02/21/19 0659  TempSrc:   PainSc: 3       Patients Stated Pain Goal: 3 (86/48/47 2072)  Complications: No apparent anesthesia complications

## 2019-02-21 NOTE — H&P (Signed)
[name] Location: [facility] Patient #: [id number] DOB: Aug 24, 1941 Married / Language: English / Race: White Male  History of Present Illness  Patient words: Patient sent at the request of Dr. [name] for left groin pain 2-3 months. The patient developed left groin pain especially with lifting or straining. The pain location is left groin without radiation to his back. The pain is made better with recumbency also there is a small bulge in his left groin that is able to push and reduce it which helps alleviate some of the pain. The pain is not all the time and is episodic made worse with exertion.  The patient is a 78 year old male.   Past Surgical History  Appendectomy Cataract Surgery Left. Colon Polyp Removal - Colonoscopy Colon Removal - Partial Knee Surgery Right. Lung Surgery Right. Tonsillectomy  Diagnostic Studies History  Colonoscopy 1-5 years ago  Allergies  No Known Allergies  No Known Drug Allergies  Allergies Reconciled  Medication History  amLODIPine Besylate (10MG  Tablet, Oral) Active. Atorvastatin Calcium (20MG  Tablet, Oral) Active. Doxycycline Hyclate (100MG  Tablet, Oral) Active. Dorzolamide HCl-Timolol Mal (22.3-6.8MG /ML Solution, Ophthalmic) Active. Eliquis (5MG  Tablet, Oral) Active. Finasteride (5MG  Tablet, Oral) Active. Flovent Diskus (100MCG/BLIST Aero Pow Br Act, Inhalation) Active. Gabapentin (800MG  Tablet, Oral) Active. Lumigan (0.01% Solution, Ophthalmic) Active. Pantoprazole Sodium (40MG  Tablet DR, Oral) Active. Metoprolol Tartrate (25MG  Tablet, Oral) Active. Medications Reconciled  Social History  Alcohol use Occasional alcohol use. Caffeine use Coffee. No drug use Tobacco use Never smoker.  Family History  Arthritis Brother, Daughter, Mother, Son. Cancer Brother. Cerebrovascular Accident Father. Colon Polyps Brother, Mother, Son. Depression Mother, Son. Diabetes Mellitus  Son. Heart Disease Brother, Mother. Heart disease in male family member before age 25 Heart disease in male family member before age 58 Hypertension Brother, Mother. Kidney Disease Brother. Thyroid problems Brother, Daughter, Mother, Son.  Other Problems Arthritis Asthma Atrial Fibrillation Back Pain Chest pain Chronic Obstructive Lung Disease Colon Cancer Congestive Heart Failure Enlarged Prostate Gastroesophageal Reflux Disease Heart murmur High blood pressure Hypercholesterolemia Kidney Stone Myocardial infarction Sleep Apnea     Review of Systems  General Present- Fatigue and Night Sweats. Not Present- Appetite Loss, Chills, Fever, Weight Gain and Weight Loss. Skin Not Present- Change in Wart/Mole, Dryness, Hives, Jaundice, New Lesions, Non-Healing Wounds, Rash and Ulcer. HEENT Present- Hearing Loss, Ringing in the Ears and Wears glasses/contact lenses. Not Present- Earache, Hoarseness, Nose Bleed, Oral Ulcers, Seasonal Allergies, Sinus Pain, Sore Throat, Visual Disturbances and Yellow Eyes. Respiratory Not Present- Bloody sputum, Chronic Cough, Difficulty Breathing, Snoring and Wheezing. Breast Not Present- Breast Mass, Breast Pain, Nipple Discharge and Skin Changes. Cardiovascular Present- Swelling of Extremities. Not Present- Chest Pain, Difficulty Breathing Lying Down, Leg Cramps, Palpitations, Rapid Heart Rate and Shortness of Breath. Gastrointestinal Present- Abdominal Pain. Not Present- Bloating, Bloody Stool, Change in Bowel Habits, Chronic diarrhea, Constipation, Difficulty Swallowing, Excessive gas, Gets full quickly at meals, Hemorrhoids, Indigestion, Nausea, Rectal Pain and Vomiting. Male Genitourinary Present- Frequency. Not Present- Blood in Urine, Change in Urinary Stream, Impotence, Nocturia, Painful Urination, Urgency and Urine Leakage. Musculoskeletal Present- Back Pain, Joint Pain and Joint Stiffness. Not Present- Muscle Pain,  Muscle Weakness and Swelling of Extremities. Neurological Present- Numbness, Tingling, Trouble walking and Weakness. Not Present- Decreased Memory, Fainting, Headaches, Seizures and Tremor. Psychiatric Not Present- Anxiety, Bipolar, Change in Sleep Pattern, Depression, Fearful and Frequent crying. Endocrine Present- Cold Intolerance. Not Present- Excessive Hunger, Hair Changes, Heat Intolerance, Hot flashes and New Diabetes. Hematology Present- Blood  Thinners, Easy Bruising and Excessive bleeding. Not Present- Gland problems, HIV and Persistent Infections.  Vitals  01/17/2019 3:03 PM Weight: 184.5 lb Temp.: 97.30F(Temporal)  Pulse: 97 (Regular)  P.OX: 95% (Room air) BP: 142/72 (Sitting, Right Arm, Standard)      Physical Exam   General Mental Status-Alert. General Appearance-Consistent with stated age. Hydration-Well hydrated. Voice-Normal.  Head and Neck Head-normocephalic, atraumatic with no lesions or palpable masses. Trachea-midline. Thyroid Gland Characteristics - normal size and consistency.  Chest and Lung Exam Chest and lung exam reveals -quiet, even and easy respiratory effort with no use of accessory muscles and on auscultation, normal breath sounds, no adventitious sounds and normal vocal resonance. Inspection Chest Wall - Normal. Back - normal.  Cardiovascular Note: Atrial fibrillation.  Pacemaker in place  Abdomen Note: Lower midline scar noted. Reducible left inguinal hernia. No evidence of right inguinal hernia.  Neurologic Neurologic evaluation reveals -alert and oriented x 3 with no impairment of recent or remote memory. Mental Status-Normal.  Musculoskeletal Normal Exam - Left-Upper Extremity Strength Normal and Lower Extremity Strength Normal. Normal Exam - Right-Upper Extremity Strength Normal and Lower Extremity Strength Normal.    Assessment & Plan   LEFT INGUINAL HERNIA (K40.90) Impression:  discussed repair of left inguinal hernia which is reducible. Needs cardiac clearance. Once this is done we'll schedule surgery.  The risk of hernia repair include bleeding, infection, organ injury, bowel injury, bladder injury, nerve injury recurrent hernia, blood clots, worsening of underlying condition, chronic pain, mesh use, open surgery, death, and the need for other operattions. Pt agrees to proceed  Current Plans You are being scheduled for surgery- Our schedulers will call you.  You should hear from our office's scheduling department within 5 working days about the location, date, and time of surgery. We try to make accommodations for patient's preferences in scheduling surgery, but sometimes the OR schedule or the surgeon's schedule prevents [place] from making those accommodations.  If you have not heard from our office [phone]) in 5 working days, call the office and ask for your surgeon's nurse.  If you have other questions about your diagnosis, plan, or surgery, call the office and ask for your surgeon's nurse.  Pt Education - Pamphlet Given - Hernia Surgery: discussed with patient and provided information. The anatomy & physiology of the abdominal wall and pelvic floor was discussed. The pathophysiology of hernias in the inguinal and pelvic region was discussed. Natural history risks such as progressive enlargement, pain, incarceration, and strangulation was discussed. Contributors to complications such as smoking, obesity, diabetes, prior surgery, etc were discussed.  I feel the risks of no intervention will lead to serious problems that outweigh the operative risks; therefore, I recommended surgery to reduce and repair the hernia. I explained an open approach. I noted usual use of mesh to patch and/or buttress hernia repair  Risks such as bleeding, infection, abscess, need for further treatment, heart attack, death, and other risks were discussed. I noted a good  likelihood this will help address the problem. Goals of post-operative recovery were discussed as well. Possibility that this will not correct all symptoms was explained. I stressed the importance of low-impact activity, aggressive pain control, avoiding constipation, & not pushing through pain to minimize risk of post-operative chronic pain or injury. Possibility of reherniation was discussed. We will work to minimize complications.  An educational handout further explaining the pathology & treatment options was given as well. Questions were answered. The patient expresses understanding & wishes to proceed with  surgery.  Pt Education - CCS Mesh education: discussed with patient and provided information.

## 2019-02-21 NOTE — Anesthesia Procedure Notes (Addendum)
Procedure Name: Intubation Date/Time: 02/21/2019 8:40 AM Performed by: Bryson Corona, CRNA Pre-anesthesia Checklist: Patient identified, Emergency Drugs available, Suction available and Patient being monitored Patient Re-evaluated:Patient Re-evaluated prior to induction Oxygen Delivery Method: Circle System Utilized Preoxygenation: Pre-oxygenation with 100% oxygen Induction Type: IV induction Ventilation: Oral airway inserted - appropriate to patient size Laryngoscope Size: Mac and 3 Grade View: Grade II Tube type: Oral Tube size: 7.0 mm Number of attempts: 1 Airway Equipment and Method: Stylet and Oral airway Placement Confirmation: ETT inserted through vocal cords under direct vision,  positive ETCO2 and breath sounds checked- equal and bilateral Secured at: 22 cm Tube secured with: Tape Dental Injury: Teeth and Oropharynx as per pre-operative assessment

## 2019-02-21 NOTE — Interval H&P Note (Signed)
History and Physical Interval Note:  02/21/2019 7:15 AM  Derrick [name]  has presented today for surgery, with the diagnosis of left inguinal hernia  The various methods of treatment have been discussed with the patient and family. After consideration of risks, benefits and other options for treatment, the patient has consented to  Procedure(s): LEFT INGUINAL HERNIA REPAIR WITH MESH (Left) as a surgical intervention .  The patient's history has been reviewed, patient examined, no change in status, stable for surgery.  I have reviewed the patient's chart and labs.  Questions were answered to the patient's satisfaction.     [place]

## 2019-02-21 NOTE — Anesthesia Procedure Notes (Signed)
Anesthesia Regional Block: TAP block   Pre-Anesthetic Checklist: ,, timeout performed, Correct Patient, Correct Site, Correct Laterality, Correct Procedure, Correct Position, site marked, Risks and benefits discussed, pre-op evaluation,  At surgeon's request and post-op pain management  Laterality: Left  Prep: Maximum Sterile Barrier Precautions used, chloraprep       Needles:  Injection technique: Single-shot  Needle Type: Echogenic Stimulator Needle     Needle Length: 9cm  Needle Gauge: 21     Additional Needles:   Narrative:  Start time: 02/21/2019 8:01 AM End time: 02/21/2019 8:04 AM Injection made incrementally with aspirations every 5 mL. Anesthesiologist: Brennan Bailey, MD  Additional Notes: Risks, benefits, and alternative discussed. Patient gave consent for procedure. Patient prepped and draped in sterile fashion. Sedation administered, patient remains easily responsive to voice. Relevant anatomy identified with ultrasound guidance. Local anesthetic given in 5cc increments with no signs or symptoms of intravascular injection. No pain or paraesthesias with injection. Patient monitored throughout procedure with signs of LAST or immediate complications. Tolerated well. Ultrasound image placed in chart. Tawny Asal, MD

## 2019-02-21 NOTE — Anesthesia Postprocedure Evaluation (Signed)
Anesthesia Post Note  Patient: Derrick [name]  Procedure(s) Performed: LEFT INGUINAL HERNIA REPAIR WITH MESH (Left Groin)     Patient location during evaluation: PACU Anesthesia Type: General Level of consciousness: awake and alert Pain management: pain level controlled Vital Signs Assessment: post-procedure vital signs reviewed and stable Respiratory status: spontaneous breathing, nonlabored ventilation and respiratory function stable Cardiovascular status: blood pressure returned to baseline and stable Postop Assessment: no apparent nausea or vomiting Anesthetic complications: no    Last Vitals:  Vitals:   02/21/19 1015 02/21/19 1020  BP:  113/64  Pulse: 60 60  Resp: 16 10  Temp:    SpO2: 94% 96%    Last Pain:  Vitals:   02/21/19 1005  TempSrc:   PainSc: [place] E Carlicia Leavens

## 2019-02-21 NOTE — Discharge Instructions (Signed)
[facility] _______[facility] ° °UMBILICAL OR INGUINAL HERNIA REPAIR: POST OP INSTRUCTIONS ° °Always review your discharge instruction sheet given to you by the facility where your surgery was performed. °IF YOU HAVE DISABILITY OR FAMILY LEAVE FORMS, YOU MUST BRING THEM TO THE OFFICE FOR PROCESSING.   °DO NOT GIVE THEM TO YOUR DOCTOR. ° °1. A  prescription for pain medication may be given to you upon discharge.  Take your pain medication as prescribed, if needed.  If narcotic pain medicine is not needed, then you may take acetaminophen (Tylenol) or ibuprofen (Advil) as needed. °2. Take your usually prescribed medications unless otherwise directed. °If you need a refill on your pain medication, please contact your pharmacy.  They will contact our office to request authorization. Prescriptions will not be filled after 5 pm or on week-ends. °3. You should follow a light diet the first 24 hours after arrival home, such as soup and crackers, etc.  Be sure to include lots of fluids daily.  Resume your normal diet the day after surgery. °4.Most patients will experience some swelling and bruising around the umbilicus or in the groin and scrotum.  Ice packs and reclining will help.  Swelling and bruising can take several days to resolve.  °6. It is common to experience some constipation if taking pain medication after surgery.  Increasing fluid intake and taking a stool softener (such as Colace) will usually help or prevent this problem from occurring.  A mild laxative (Milk of Magnesia or Miralax) should be taken according to package directions if there are no bowel movements after 48 hours. °7. Unless discharge instructions indicate otherwise, you may remove your bandages 24-48 hours after surgery, and you may shower at that time.  You may have steri-strips (small skin tapes) in place directly over the incision.  These strips should be left on the skin for 7-10 days.  If your surgeon used skin glue on the  incision, you may shower in 24 hours.  The glue will flake off over the next 2-3 weeks.  Any sutures or staples will be removed at the office during your follow-up visit. °8. ACTIVITIES:  You may resume regular (light) daily activities beginning the next day--such as daily self-care, walking, climbing stairs--gradually increasing activities as tolerated.  You may have sexual intercourse when it is comfortable.  Refrain from any heavy lifting or straining until approved by your doctor. ° °a.You may drive when you are no longer taking prescription pain medication, you can comfortably wear a seatbelt, and you can safely maneuver your car and apply brakes. °b.RETURN TO WORK:   °_____________________________________________ ° °9.You should see your doctor in the office for a follow-up appointment approximately 2-3 weeks after your surgery.  Make sure that you call for this appointment within a day or two after you arrive home to insure a convenient appointment time. °10.OTHER INSTRUCTIONS: _________________________ °   _____________________________________ ° °WHEN TO CALL YOUR DOCTOR: °1. Fever over 101.0 °2. Inability to urinate °3. Nausea and/or vomiting °4. Extreme swelling or bruising °5. Continued bleeding from incision. °6. Increased pain, redness, or drainage from the incision ° °The clinic staff is available to answer your questions during regular business hours.  Please don’t hesitate to call and ask to speak to one of the nurses for clinical concerns.  If you have a medical emergency, go to the nearest emergency room or call 911.  A surgeon from [facility] is always on call at the hospital ° ° °  [address], [place], [place], [place]  [address] ?  [address], [place], [place]   [address] [phone] ? [phone] ? FAX [phone] Web site: [email or web address]       RESTART ELOQUIS ON Friday

## 2019-02-21 NOTE — Op Note (Addendum)
Preoperative diagnosis: Left inguinal hernia  Postoperative diagnosis: Direct left inguinal hernia procedure:   Procedure:  Repair left inguinal hernia with mesh  Surgeon: [name], MD  Anesthesia: General with TAP block the left and 0.25% Sensorcaine plain local  EBL: 10 cc  Specimens: None  Drains: None  IV fluids: Per anesthesia record  Indications for procedure: Patient presents for repair of symptomatic left inguinal hernia.  We discussed options of repair as well as the use of mesh and long-term expectations of mesh use and potential complications of mesh to include infection and chronic pain.  He understood the above.  He was done a laparoscopic candidate due to his previous laparotomy.The risk of hernia repair include bleeding,  Infection,   Recurrence of the hernia,  Mesh use, chronic pain,  Organ injury,  Bowel injury,  Bladder injury,   nerve injury with numbness around the incision,  Death,  and worsening of preexisting  medical problems.  The alternatives to surgery have been discussed as well..  Long term expectations of both operative and non operative treatments have been discussed.   The patient agrees to proceed.   Description of procedure: The patient was met in the holding area.  The left side was marked as the correct site with the assistance of the patient.  Block was placed by anesthesia.  Questions were answered.  He was taken back the operative room placed supine upon the operative table.  After induction of general anesthesia, the left inguinal region was prepped and draped in sterile fashion timeout was done.  Proper patient, side and procedure were verified.  Local anesthetic was infiltrated in the left inguinal crease.  A 5 cm incision was made left inguinal crease dissection was carried down through Scarpa's fascia until the aponeurosis of the external oblique was identified.  This was opened with a scalpel and Metzenbaum scissors through the external ring.   Cord structures were encircled with 1/2 inch Penrose drain.  The the lateral femoral cutaneous nerves identified.  The ileal hypogastric nerve identified in the ilioinguinal nerve identified.  There is a small lipoma with the cord.  We then dissected off the cord structures and found no significant indirect hernia.  He had a direct defect.  An ultra Pro hernia system was used with the inner leaflet placed into the preperitoneal space and deployed.  The onlay was placed on the floor the inguinal canal a slit was cut for the cord structures.  The mesh was secured to the pubic tubercle, the conjoined tendon and the shelving edge of the inguinal ligament with 2-0 Novafil.  Care was taken to protect the lateral femoral cutaneous nerve and the iliohypogastric nerves.  The ilioinguinal nerve was tethered under the mesh therefore was divided as it exited the muscle.  The mesh was secured in the mesh was closed around the cord structures with #2 Novafil.  There is ample room for the cord structures to exit without any constriction.  Defect was closed nicely without any undue tension and hemostasis was achieved.  The aponeurosis of the external oblique was closed with 2-0 Vicryl.  3-0 Vicryl was used to approximate Scarpa's fascia and 4-0 Monocryl was used to close skin in a subcuticular fashion.  Dermabond applied.  All final counts found to be correct.  The patient was awoke extubated taken to recovery in satisfactory condition.

## 2019-02-22 ENCOUNTER — Encounter (HOSPITAL_COMMUNITY): Payer: Self-pay | Admitting: Surgery

## 2019-03-23 ENCOUNTER — Other Ambulatory Visit: Payer: Self-pay

## 2019-03-23 ENCOUNTER — Encounter: Payer: Self-pay | Admitting: Cardiology

## 2019-03-23 ENCOUNTER — Telehealth (INDEPENDENT_AMBULATORY_CARE_PROVIDER_SITE_OTHER): Payer: Medicare Other | Admitting: Cardiology

## 2019-03-23 DIAGNOSIS — I495 Sick sinus syndrome: Secondary | ICD-10-CM

## 2019-03-23 DIAGNOSIS — Z79899 Other long term (current) drug therapy: Secondary | ICD-10-CM | POA: Diagnosis not present

## 2019-03-23 DIAGNOSIS — I480 Paroxysmal atrial fibrillation: Secondary | ICD-10-CM | POA: Diagnosis not present

## 2019-03-23 DIAGNOSIS — I10 Essential (primary) hypertension: Secondary | ICD-10-CM | POA: Diagnosis not present

## 2019-03-23 DIAGNOSIS — I2510 Atherosclerotic heart disease of native coronary artery without angina pectoris: Secondary | ICD-10-CM

## 2019-03-23 NOTE — Progress Notes (Signed)
Electrophysiology TeleHealth Note   Due to national recommendations of social distancing due to COVID 19, an audio/video telehealth visit is felt to be most appropriate for this patient at this time.  See MyChart message from today for the patient's consent to telehealth for [facility].   Date:  03/23/2019   ID:  Derrick [name], DOB 02-13-1941, MRN [id number]  Location: patient's home  Provider location: [address], [place]  Evaluation Performed: Follow-up visit  PCP:  [name], MD  Cardiologist:  [name], MD  Electrophysiologist:  Nyheim Seufert [name], MD   Chief Complaint:  Atrial fibrillation  History of Present Illness:    Derrick [name] is a 78 y.o. male who presents via audio/video conferencing for a telehealth visit today.  Since last being seen in our clinic, the patient reports doing very well.  Today, he denies symptoms of palpitations, chest pain, shortness of breath,  lower extremity edema, dizziness, presyncope, or syncope.  The patient is otherwise without complaint today.  The patient denies symptoms of fevers, chills, cough, or new SOB worrisome for COVID 19.   He has a history of coronary artery disease status post MI and drug-eluting stent to the RCA.  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  Overall he is feeling well.  He has no chest pain or shortness of breath.  He is able to do all of his daily activities without restriction.  He continues to walk with his wife around the neighborhood.  He had hernia surgery in February and when he initially started to walk, he had some mild chest discomfort, but this has improved with continued exercise.  Past Medical History:  Diagnosis Date  . Anemia   . BENIGN PROSTATIC HYPERTROPHY, WITH OBSTRUCTION 05/28/2010  . CAD (coronary artery disease)    a. s/p NSTEMI 06/01/11: DES to RCA;  b. cath 06/25/11:   dLM 50-60% (FFR 0.87), prox to mid LAD 40-50%,  D1 50%, pCFX 50%, RCA stent ok, dPDA 80%, EF 55-60%.  His FFR was felt to be negative and therefore medical therapy was recommended ;  echo 6/12: EF 55-60%, mild AS   . Chronic back pain   . 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 Colon cancer ([place]) dx'd 2000   "left"  . COLONIC POLYPS, ADENOMATOUS, HX OF 05/28/2010  . Complication of anesthesia    "stopped breathing; related to my sleep apnea" & urinary retention   . COPD (chronic obstructive pulmonary disease) ([place])    "associated w/lung sarcoidosis"  . 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 DISH (diffuse idiopathic skeletal hyperostosis) 12/02/2015  . Diverticulitis   . GENERALIZED OSTEOARTHROSIS UNSPECIFIED SITE 05/28/2010  . GERD 05/28/2010  . Heart murmur   . History of gout   . HYPERLIPIDEMIA 05/28/2010  . Hypertension   . IgM lambda paraproteinemia   . 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 Malignant neoplasm of descending colon ([place]) 05/28/2010  . MITRAL VALVE PROLAPSE 05/28/2010  . 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 NSTEMI (non-ST elevated myocardial infarction) ([place]) 06/03/11  . 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  . OSA on CPAP 05/28/2010  . PALPITATIONS, CHRONIC 05/28/2010  . Parotid gland pain 2010   infection  . Peripheral neuropathy    "tx'd w/targeted chemo" (05/12/2016)  . Pneumonia 2-3 times  . Presence of permanent cardiac pacemaker   . 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 Sarcoidosis  pulmonalis  . UNSPECIFIED INFLAMMATORY AND TOXIC NEUROPATHY 05/28/2010  . Vision loss   . VITAMIN D DEFICIENCY 05/28/2010    Past Surgical History:  Procedure Laterality Date  . APPENDECTOMY  1954  . CATARACT EXTRACTION W/ INTRAOCULAR LENS IMPLANT Left   . COLON SURGERY  2000   decending colon   . CORONARY ANGIOPLASTY WITH STENT PLACEMENT  06/03/11  . ELECTROPHYSIOLOGIC STUDY N/A 05/12/2016   Procedure: Cardioversion;  Surgeon: Matricia Begnaud  [name], MD;  Location: [facility];  Service: Cardiovascular;  Laterality: N/A;  . EP IMPLANTABLE DEVICE N/A 05/12/2016   Procedure: Pacemaker Implant;  Surgeon: Norah Devin [name], MD;  Location: [facility];  Service: Cardiovascular;  Laterality: N/A;  . EYE SURGERY    . INGUINAL HERNIA REPAIR Left 02/21/2019   Procedure: LEFT INGUINAL HERNIA REPAIR WITH MESH;  Surgeon: [name], MD;  Location: [facility];  Service: General;  Laterality: Left;  . INSERT / REPLACE / REMOVE PACEMAKER  05/12/2016  . KNEE ARTHROSCOPY Right 2003  . LEFT HEART CATHETERIZATION WITH CORONARY ANGIOGRAM N/A 03/31/2015   Procedure: LEFT HEART CATHETERIZATION WITH CORONARY ANGIOGRAM;  Surgeon: [name], MD;  Location: [facility];  Service: Cardiovascular;  Laterality: N/A;  . LUNG SURGERY  2001   "open lung dissection"  . MOHS SURGERY Left ~ 2008   ear  . PROSTATE BIOPSY  2010  . TONSILLECTOMY  1950    Current Outpatient Medications  Medication Sig Dispense Refill  . albuterol (PROVENTIL HFA;VENTOLIN HFA) 108 (90 Base) MCG/ACT inhaler Inhale 2 puffs into the lungs every 6 (six) hours as needed for wheezing or shortness of breath. 1 Inhaler 1  . amLODipine (NORVASC) 10 MG tablet TAKE 1 TABLET BY MOUTH ONCE DAILY (Patient taking differently: Take 10 mg by mouth daily. ) 90 tablet 3  . atorvastatin (LIPITOR) 20 MG tablet Take 1 tablet (20 mg total) by mouth daily at 6 PM. 90 tablet 3  . bimatoprost (LUMIGAN) 0.01 % SOLN Place 1 drop into both eyes at bedtime.    . Cholecalciferol 25 MCG (1000 UT) tablet Take 1,000 Units by mouth daily.     . dorzolamide-timolol (COSOPT) 22.3-6.8 MG/ML ophthalmic solution Place 1 drop into both eyes 2 (two) times daily.     [name] ELIQUIS 5 MG TABS tablet TAKE 1 TABLET BY MOUTH TWICE DAILY (Patient taking differently: Take 5 mg by mouth 2 (two) times daily. ) 180 tablet 0  . finasteride (PROSCAR) 5 MG tablet Take 5 mg by mouth daily.     . fluticasone (FLONASE) 50  MCG/ACT nasal spray Place 2 sprays into both nostrils 2 (two) times daily.    . Fluticasone Propionate, Inhal, (FLOVENT DISKUS) 100 MCG/BLIST AEPB Inhale 1 Inhaler into the lungs 2 (two) times daily. (Patient taking differently: Inhale 1 Inhaler into the lungs 2 (two) times daily as needed (wheezing or shortness of breath). ) 60 each 1  . HYDROcodone-acetaminophen (NORCO/VICODIN) 5-325 MG tablet Take 1 tablet by mouth every 6 (six) hours as needed for moderate pain. 12 tablet 0  . metoprolol tartrate (LOPRESSOR) 25 MG tablet Take 1 tablet (25 mg total) by mouth 2 (two) times daily. 180 tablet 3  . MULTAQ 400 MG tablet TAKE 1 TABLET BY MOUTH TWICE DAILY WITH MEALS (Patient taking differently: Take 400 mg by mouth 2 (two) times daily with a meal. ) 180 tablet 3  . pantoprazole (PROTONIX) 40 MG tablet TAKE 1 TABLET BY MOUTH ONCE DAILY (Patient taking differently:  Take 40 mg by mouth daily. ) 90 tablet 3  . tadalafil (CIALIS) 5 MG tablet Take 5 mg by mouth daily.     [name] triamterene-hydrochlorothiazide (MAXZIDE-25) 37.5-25 MG tablet TAKE 1 TABLET BY MOUTH ONCE DAILY (Patient taking differently: Take 1 tablet by mouth daily. ) 90 tablet 3   Current Facility-Administered Medications  Medication Dose Route Frequency Provider Last Rate Last Dose  . 0.9 %  sodium chloride infusion  500 mL Intravenous Continuous Pyrtle, Lajuan Lines, MD        Allergies:   Patient has no known allergies.   Social History:  The patient  reports that he has never smoked. He has never used smokeless tobacco. He reports current alcohol use. He reports that he does not use drugs.   Family History:  The patient's  family history includes Arrhythmia (age of onset: 39) in his brother; Arthritis in his mother; Cancer in his brother; Colon polyps in his mother; Congestive Heart Failure in his brother; Coronary artery disease in his brother and mother; Heart attack in his brother; Heart disease in his mother.   ROS:  Please see the history of  present illness.   All other systems are personally reviewed and negative.    Exam:    Vital Signs:  BP 128/67   Pulse 62   Wt 184 lb (83.5 kg)   SpO2 98%   BMI 26.40 kg/m    Well appearing, alert and conversant, regular work of breathing,  good skin color Eyes- anicteric, neuro- grossly intact, skin- no apparent rash or lesions or cyanosis, mouth- oral mucosa is pink   Labs/Other Tests and Data Reviewed:    Recent Labs: 08/17/2018: TSH 2.02 02/13/2019: ALT 19; BUN 13; Creatinine, Ser 0.97; Hemoglobin 14.3; Platelets 191; Potassium 3.8; Sodium 138   Wt Readings from Last 3 Encounters:  03/23/19 184 lb (83.5 kg)  02/21/19 182 lb (82.6 kg)  02/13/19 184 lb 12.8 oz (83.8 kg)     Other studies personally reviewed: Additional studies/ records that were reviewed today include: TTE 09/27/18  Review of the above records today demonstrates:  - Left ventricle: The cavity size was normal. Wall thickness was   increased in a pattern of mild LVH. Systolic function was normal.   The estimated ejection fraction was in the range of 50% to 55%.   There is hypokinesis of the apical myocardium. Doppler parameters   are consistent with abnormal left ventricular relaxation (grade 1   diastolic dysfunction). - Aortic valve: Valve mobility was restricted. There was mild   stenosis. - Ascending aorta: The ascending aorta was mildly dilated. - Mitral valve: Calcified annulus. - Left atrium: The atrium was mildly dilated.  ECG 128/20 - personally decided to do this is to verify the hospital history reviewed.  Reviewed AV paced   ASSESSMENT & PLAN:    1.  Paroxysmal atrial fibrillation: Currently on Multitak and Eliquis.  [facility] vascular 3.  He is remained in sinus rhythm.  He has no palpitations or shortness of breath.  We Analissa Bayless continue with current management.  2.  Hypertension: Currently well controlled.  No changes.  3.  Coronary artery disease: No current chest pain.  Was having chest pain  after his hernia surgery but this has resolved.  It was pain with exertion.  He continued to exercise with his wife walking around the neighborhood which improved his pain.  No changes.  4.  Sick sinus syndrome: Status post [employer]onic dual-chamber pacemaker 05/12/2016.  Has  had some nonsustained VT.  Has been started on metoprolol.   COVID 19 screen The patient denies symptoms of COVID 19 at this time.  The importance of social distancing was discussed today.  Follow-up: 6 months  Current medicines are reviewed at length with the patient today.   The patient does not have concerns regarding his medicines.  The following changes were made today:  none  Labs/ tests ordered today include:  No orders of the defined types were placed in this encounter.    Patient Risk:  after full review of this patients clinical status, I feel that they are at moderate risk at this time.  Today, I have spent 17 minutes with the patient with telehealth technology discussing atrial fibrillation, coronary artery disease.    Signed, Bushra Denman [name], MD  03/23/2019 11:11 AM     [facility] [address] [place] [address] [phone] (office) [phone] (fax)

## 2019-03-23 NOTE — Patient Instructions (Signed)
Medication Instructions:  Your physician recommends that you continue on your current medications as directed. Please refer to the Current Medication list given to you today.  If you need a refill on your cardiac medications before your next appointment, please call your pharmacy.   Labwork: None ordered  Testing/Procedures: None ordered  Follow-Up: Your physician wants you to follow-up in: 6 months with Dr. Camnitz.  You will receive a reminder letter in the mail two months in advance. If you don't receive a letter, please call our office to schedule the follow-up appointment.  Thank you for choosing [facility]!!   Aydn Ferrara, RN [phone]         

## 2019-04-11 DIAGNOSIS — M5489 Other dorsalgia: Secondary | ICD-10-CM | POA: Diagnosis not present

## 2019-04-11 DIAGNOSIS — M25561 Pain in right knee: Secondary | ICD-10-CM | POA: Diagnosis not present

## 2019-04-11 DIAGNOSIS — M2550 Pain in unspecified joint: Secondary | ICD-10-CM | POA: Diagnosis not present

## 2019-04-11 DIAGNOSIS — M150 Primary generalized (osteo)arthritis: Secondary | ICD-10-CM | POA: Diagnosis not present

## 2019-04-12 ENCOUNTER — Other Ambulatory Visit: Payer: Self-pay | Admitting: Cardiology

## 2019-04-12 ENCOUNTER — Other Ambulatory Visit: Payer: Self-pay | Admitting: *Deleted

## 2019-04-12 DIAGNOSIS — I4891 Unspecified atrial fibrillation: Secondary | ICD-10-CM

## 2019-04-12 MED ORDER — APIXABAN 5 MG PO TABS: 5.0000 mg | ORAL_TABLET | Freq: Two times a day (BID) | ORAL | 1 refills | Status: DC

## 2019-04-12 NOTE — Telephone Encounter (Signed)
Age 78, weight 83.5kg, SCr 0.97 02/13/19, indication afib. Refill appropriate.

## 2019-04-19 DIAGNOSIS — M25561 Pain in right knee: Secondary | ICD-10-CM | POA: Diagnosis not present

## 2019-04-19 DIAGNOSIS — M5489 Other dorsalgia: Secondary | ICD-10-CM | POA: Diagnosis not present

## 2019-04-19 DIAGNOSIS — M150 Primary generalized (osteo)arthritis: Secondary | ICD-10-CM | POA: Diagnosis not present

## 2019-04-19 DIAGNOSIS — M2550 Pain in unspecified joint: Secondary | ICD-10-CM | POA: Diagnosis not present

## 2019-05-07 ENCOUNTER — Encounter: Payer: Self-pay | Admitting: Cardiovascular Disease

## 2019-05-07 ENCOUNTER — Telehealth (INDEPENDENT_AMBULATORY_CARE_PROVIDER_SITE_OTHER): Payer: Medicare Other | Admitting: Cardiovascular Disease

## 2019-05-07 ENCOUNTER — Telehealth: Payer: Self-pay

## 2019-05-07 ENCOUNTER — Other Ambulatory Visit: Payer: Self-pay

## 2019-05-07 VITALS — BP 119/58 | HR 60 | Temp 98.6°F | Ht 70.0 in | Wt 182.5 lb

## 2019-05-07 DIAGNOSIS — I480 Paroxysmal atrial fibrillation: Secondary | ICD-10-CM

## 2019-05-07 DIAGNOSIS — I10 Essential (primary) hypertension: Secondary | ICD-10-CM

## 2019-05-07 DIAGNOSIS — E782 Mixed hyperlipidemia: Secondary | ICD-10-CM

## 2019-05-07 DIAGNOSIS — I25119 Atherosclerotic heart disease of native coronary artery with unspecified angina pectoris: Secondary | ICD-10-CM

## 2019-05-07 MED ORDER — METOPROLOL TARTRATE 50 MG PO TABS: 50.0000 mg | ORAL_TABLET | Freq: Two times a day (BID) | ORAL | 3 refills | Status: DC

## 2019-05-07 MED ORDER — AMLODIPINE BESYLATE 5 MG PO TABS: 5.0000 mg | ORAL_TABLET | Freq: Every day | ORAL | 3 refills | Status: DC

## 2019-05-07 NOTE — Telephone Encounter (Signed)
Scheduled the patient for [facility] Tuesday, 5/19 with Dr. Burt Knack.  Dx: CAD with angina Arrival time: 50 for 1300 case Instruction letter released to [place]. COVID screen scheduled this Friday. Instructions reviewed with patient.       COVID-19 Pre-Screening Questions:  . How are you feeling today, any cough,  shortness of breath, headache, congestion, fever, body aches, chills, sore throat, or sudden loss of taste or sense of smell? NO . Have you been around anyone with known Covid 19? NO . Have you been around anyone who is awaiting Covid 19 test results? NO . Have you been around anyone who has been exposed to Covid 19, or has mentioned symptoms of Covid 19 within the past 14 days,? NO  If you have any concerns about symptoms your patients report please contact your leadership team, or the provider the patient is seeing in the office for further guidance.

## 2019-05-07 NOTE — H&P (View-Only) (Signed)
Virtual Visit via Video Note   This visit type was conducted due to national recommendations for restrictions regarding the COVID-19 Pandemic (e.g. social distancing) in an effort to limit this patient's exposure and mitigate transmission in our community.  Due to his co-morbid illnesses, this patient is at least at moderate risk for complications without adequate follow up.  This format is felt to be most appropriate for this patient at this time.  All issues noted in this document were discussed and addressed.  A limited physical exam was performed with this format.  Please refer to the patient's chart for his consent to telehealth for [facility].   Date:  05/07/2019   ID:  [name], DOB Feb 18, 1941, MRN [id number]  Patient Location: Home Provider Location: Home  PCP:  [name], MD  Cardiologist:  [name], MD  Electrophysiologist:  [name], MD   Evaluation Performed:  Follow-Up Visit  Chief Complaint: Follow-up coronary artery disease  History of Present Illness:    Derrick [name] is a 78 y.o. male with History of coronary artery disease, paroxysmal atrial fibrillation, hypertension, and mixed hyperlipidemia, presenting for follow-up evaluation via video conferencing in light of the COVID-19 pandemic.  The patient does not have symptoms concerning for COVID-19 infection (fever, chills, cough, or new shortness of breath).    Patient had a inferior wall MI and underwent DES to RCA in 2012.  He also had moderate left main disease 50 to 60% with FFR of 0.7, 40 to 50% proximal to mid LAD disease, 30% D1, 50% proximal left circumflex, 80% RPDA lesion.  FFR was felt to be negative therefore medical therapy was recommended.  He underwent permanent pacemaker placement in 2017 for paroxysmal atrial fibrillation with bradycardic episode.  Myoview performed on 05/29/2018 showed EF 51%, medium sized moderate intensity basal to apical inferior and inferolateral perfusion  defect consistent with prior MI with no ischemia, overall low risk study.  Echocardiogram obtained on 09/27/2018 showed EF 50 to 55%, hypokinesis of the apical myocardium, grade 1 DD, mild aortic stenosis, mildly dilated ascending aorta.His last device interrogation showed a run of nonsustained VT and he was started on metoprolol.  The patient underwent left inguinal hernia repair in February without complication.  He feels that he's fully recovered. His brother, [name] (also a patient of mine), passed away last week.   The patient reports development of upper back pain with walking.  This is been his anginal equivalent in the past.  He describes a pressure and aching sensation that occurs when his heart rate reaches 120 bpm.  This is been a consistent finding over the past 4 to 6 weeks.  He slows down in the pain improves.  There is associated shortness of breath and fatigue.  He feels like he has been much more tired lately.  He denies resting chest or back discomfort.  He denies orthopnea, PND, or leg swelling.  He has had no medication changes.  His blood pressure has been very well controlled averaging less than 120/80 mmHg.  Past Medical History:  Diagnosis Date   Anemia    BENIGN PROSTATIC HYPERTROPHY, WITH OBSTRUCTION 05/28/2010   CAD (coronary artery disease)    a. s/p NSTEMI 06/01/11: DES to RCA;  b. cath 06/25/11:   dLM 50-60% (FFR 0.87), prox to mid LAD 40-50%, D1 50%, pCFX 50%, RCA stent ok, dPDA 80%, EF 55-60%.  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   DISH (diffuse idiopathic skeletal hyperostosis) 12/02/2015   Diverticulitis    GENERALIZED OSTEOARTHROSIS UNSPECIFIED SITE 05/28/2010   GERD 05/28/2010   Heart murmur    History of gout    HYPERLIPIDEMIA 05/28/2010   Hypertension    IgM lambda paraproteinemia    Intrinsic asthma, 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   "associated w/lung sarcoidosis"   Malignant neoplasm of descending colon ([place]) 05/28/2010   MITRAL VALVE PROLAPSE 05/28/2010   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   NSTEMI (non-ST elevated myocardial infarction) ([place]) 06/03/11   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   OSA on CPAP 05/28/2010   PALPITATIONS, CHRONIC 05/28/2010   Parotid gland pain 2010   infection   Peripheral neuropathy    "tx'd w/targeted chemo" (05/12/2016)   Pneumonia 2-3 times   Presence of permanent cardiac pacemaker    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   Sarcoidosis    pulmonalis   UNSPECIFIED INFLAMMATORY AND TOXIC NEUROPATHY 05/28/2010   Vision loss    VITAMIN D DEFICIENCY 05/28/2010   Past Surgical History:  Procedure Laterality Date   APPENDECTOMY  1954   CATARACT EXTRACTION W/ INTRAOCULAR LENS IMPLANT Left    COLON SURGERY  2000   decending colon    CORONARY ANGIOPLASTY WITH STENT PLACEMENT  06/03/11   ELECTROPHYSIOLOGIC STUDY N/A 05/12/2016   Procedure: Cardioversion;  Surgeon: [name], MD;  Location: [facility];  Service: Cardiovascular;  Laterality: N/A;   EP IMPLANTABLE DEVICE N/A 05/12/2016   Procedure: Pacemaker Implant;  Surgeon: [name], MD;  Location: [facility];  Service: Cardiovascular;  Laterality: N/A;   EYE SURGERY     INGUINAL HERNIA REPAIR Left 02/21/2019   Procedure: LEFT INGUINAL HERNIA REPAIR WITH MESH;  Surgeon: [name], MD;  Location: [facility];  Service: General;  Laterality: Left;   INSERT / REPLACE / REMOVE PACEMAKER  05/12/2016   KNEE  ARTHROSCOPY Right 2003   LEFT HEART CATHETERIZATION WITH CORONARY ANGIOGRAM N/A 03/31/2015   Procedure: LEFT HEART CATHETERIZATION WITH CORONARY ANGIOGRAM;  Surgeon: [name], MD;  [facility];  Service: Cardiovascular;  Laterality: N/A;   LUNG SURGERY  2001   "open lung dissection"   MOHS SURGERY Left ~ 2008   ear   PROSTATE BIOPSY  2010   TONSILLECTOMY  1950     Current Meds  Medication Sig   albuterol (PROVENTIL HFA;VENTOLIN HFA) 108 (90 Base) MCG/ACT inhaler Inhale 2 puffs into the lungs every 6 (six) hours as needed for wheezing or shortness of breath.   amLODipine (NORVASC) 10 MG tablet TAKE 1 TABLET BY MOUTH ONCE DAILY   apixaban (ELIQUIS) 5 MG TABS tablet Take 1 tablet (5 mg total) by mouth 2 (two) times daily.   atorvastatin (LIPITOR) 20 MG tablet Take 1 tablet (20 mg total) by mouth daily at 6 PM.   bimatoprost (LUMIGAN) 0.01 % SOLN Place 1 drop into both eyes at bedtime.   Cholecalciferol 25 MCG (1000 UT) tablet Take 1,000 Units by mouth daily.  dorzolamide-timolol (COSOPT) 22.3-6.8 MG/ML ophthalmic solution Place 1 drop into both eyes 2 (two) times daily.    finasteride (PROSCAR) 5 MG tablet Take 5 mg by mouth daily.    fluticasone (FLONASE) 50 MCG/ACT nasal spray Place 2 sprays into both nostrils 2 (two) times daily.   Fluticasone Propionate, Inhal, (FLOVENT DISKUS) 100 MCG/BLIST AEPB Inhale 1 Inhaler into the lungs 2 (two) times daily.   metoprolol tartrate (LOPRESSOR) 25 MG tablet Take 1 tablet (25 mg total) by mouth 2 (two) times daily.   MULTAQ 400 MG tablet TAKE 1 TABLET BY MOUTH TWICE DAILY WITH MEALS   pantoprazole (PROTONIX) 40 MG tablet TAKE 1 TABLET BY MOUTH ONCE DAILY   tadalafil (CIALIS) 5 MG tablet Take 5 mg by mouth daily.    triamterene-hydrochlorothiazide (MAXZIDE-25) 37.5-25 MG tablet TAKE 1 TABLET BY MOUTH ONCE DAILY   Current Facility-Administered Medications for the 05/07/19 encounter (Telemedicine) with [name],  MD  Medication   0.9 %  sodium chloride infusion     Allergies:   Patient has no known allergies.   Social History   Tobacco Use   Smoking status: Never Smoker   Smokeless tobacco: Never Used  Substance Use Topics   Alcohol use: Yes    Comment: occosional   Drug use: No     Family Hx: The patient's family history includes Arrhythmia (age of onset: 10) in his brother; Arthritis in his mother; Cancer in his brother; Colon polyps in his mother; Congestive Heart Failure in his brother; Coronary artery disease in his brother and mother; Heart attack in his brother; Heart disease in his mother.  ROS:   Please see the history of present illness.    All other systems reviewed and are negative.   Prior CV studies:   The following studies were reviewed today:  CT abdomen/pelvis 11/13/2018: IMPRESSION: 1. No acute findings within the abdomen or pelvis. 2.  Aortic Atherosclerosis (ICD10-I70.0). 3. Nonobstructing right renal calculus. 4. Prostate gland enlargement.  Echo 09/27/2018: Study Conclusions  - Left ventricle: The cavity size was normal. Wall thickness was   increased in a pattern of mild LVH. Systolic function was normal.   The estimated ejection fraction was in the range of 50% to 55%.   There is hypokinesis of the apical myocardium. Doppler parameters   are consistent with abnormal left ventricular relaxation (grade 1   diastolic dysfunction). - Aortic valve: Valve mobility was restricted. There was mild   stenosis. - Ascending aorta: The ascending aorta was mildly dilated. - Mitral valve: Calcified annulus. - Left atrium: The atrium was mildly dilated.  Impressions:  - Apical hypokinesis (possibly related to pacing); overall   preserved LV function; mild diastolic dysfunction; mild LVH;   calcified aortic valve with mild AS by doppler (mean gradient 14   mmHg); mildly dilated ascending aorta; mild LAE.  Myocardial perfusion study 05/29/2018: Study  Highlights     Nuclear stress EF: 51%.  This is a low risk study.  The left ventricular ejection fraction is mildly decreased (45-54%).   1. EF 51% with apical hypokinesis.  2. Medium-sized, moderate intensity basal to apical inferior and inferolateral perfusion defect.  This looks like prior infarction with no ischemia, though wall motion is normal in the inferior and lateral walls. .   Overall, I think this is a low risk study with no evidence for ischemia.     Labs/Other Tests and Data Reviewed:    EKG:  An ECG dated 01/23/2019 was personally reviewed  today and demonstrated:  AV sequential pacing 70 bpm  Recent Labs: 08/17/2018: TSH 2.02 02/13/2019: ALT 19; BUN 13; Creatinine, Ser 0.97; Hemoglobin 14.3; Platelets 191; Potassium 3.8; Sodium 138   Recent Lipid Panel Lab Results  Component Value Date/Time   CHOL 154 05/29/2018 08:06 AM   TRIG 52 05/29/2018 08:06 AM   HDL 60 05/29/2018 08:06 AM   CHOLHDL 2.6 05/29/2018 08:06 AM   CHOLHDL 2.7 03/19/2016 09:22 AM   LDLCALC 84 05/29/2018 08:06 AM    Wt Readings from Last 3 Encounters:  05/07/19 182 lb 8 oz (82.8 kg)  03/23/19 184 lb (83.5 kg)  02/21/19 182 lb (82.6 kg)     Objective:    Vital Signs:  BP (!) 119/58 (BP Location: Left Arm, Patient Position: Sitting, Cuff Size: Normal)    Pulse 60    Temp 98.6 F (37 C)    Ht 5\' 10"  (1.778 m)    Wt 182 lb 8 oz (82.8 kg)    SpO2 98%    BMI 26.19 kg/m    VITAL SIGNS:  reviewed  Remaining exam not performed because of telemedicine nature of this visit.  The patient is alert, oriented, and well appearing.  He is in no respiratory distress with normal conversation.  ASSESSMENT & PLAN:    1. Coronary artery disease, native vessel, with angina: Patient appears stable.  His most recent nuclear scan is reviewed with no major areas of ischemia.  He has now developed symptoms that are very typical of exertional angina.  Fortunately he is not having symptoms of unstable angina.   However, this patient has known moderate left main disease by most recent heart catheterization in 2016.  He also has a history of inferior MI with RCA stenting.  I think his coronary anatomy places him at much higher risk.  In addition, he was noted to have nonsustained ventricular tachycardia at the time of his last device interrogation and he was started on a beta-blocker.  With the development of anginal symptoms, I have recommended proceeding with cardiac catheterization and possible PCI.  He understands that if his left main stenosis has progressed, he would likely be referred for surgical consultation.  He will be instructed on holding apixaban prior to his heart catheterization procedure. I have reviewed the risks, indications, and alternatives to cardiac catheterization, possible angioplasty, and stenting with the patient. Risks include but are not limited to bleeding, infection, vascular injury, stroke, myocardial infection, arrhythmia, kidney injury, radiation-related injury in the case of prolonged fluoroscopy use, emergency cardiac surgery, and death. The patient understands the risks of serious complication is 1-2 in 4742 with diagnostic cardiac cath and 1-2% or less with angioplasty/stenting.   I also recommended increasing metoprolol to 50 mg twice daily to help with his anginal symptoms.  Because his blood pressure has been running in the low normal range, I recommended that he decrease amlodipine to 5 mg daily.  2. Paroxysmal atrial fibrillation: Appropriately anticoagulated with apixaban.  Followed by Dr. Curt Bears after undergoing permanent pacemaker placement. 3. Essential hypertension: Blood pressure well controlled.  Antihypertensive changes as outlined above related to adjustment and antianginal therapy. 4. Mixed hyperlipidemia: Liver function test from February 2020 are within normal limits.  Last lipids May 29, 2018 show total cholesterol 154, HDL 60, LDL 84 mg/dL.  COVID-19  Education: The signs and symptoms of COVID-19 were discussed with the patient and how to seek care for testing (follow up with PCP or arrange E-visit).  The importance  of social distancing was discussed today.  Time:   Today, I have spent 30 minutes with the patient with telehealth technology discussing the above problems.     Medication Adjustments/Labs and Tests Ordered: Current medicines are reviewed at length with the patient today.  Concerns regarding medicines are outlined above.   Tests Ordered: No orders of the defined types were placed in this encounter.   Medication Changes: No orders of the defined types were placed in this encounter.   Disposition:  Follow up in 6 month(s)  Signed, [name], MD  05/07/2019 8:44 AM    [place]

## 2019-05-07 NOTE — Addendum Note (Signed)
Addended by: Harland German A on: 05/07/2019 10:05 AM   Modules accepted: Orders

## 2019-05-07 NOTE — Progress Notes (Signed)
Virtual Visit via Video Note   This visit type was conducted due to national recommendations for restrictions regarding the COVID-19 Pandemic (e.g. social distancing) in an effort to limit this patient's exposure and mitigate transmission in our community.  Due to his co-morbid illnesses, this patient is at least at moderate risk for complications without adequate follow up.  This format is felt to be most appropriate for this patient at this time.  All issues noted in this document were discussed and addressed.  A limited physical exam was performed with this format.  Please refer to the patient's chart for his consent to telehealth for [name].   Date:  05/07/2019   ID:  [name], DOB 1941-04-16, MRN [id number]  Patient Location: Home Provider Location: Home  PCP:  [name], MD  Cardiologist:  [name], MD  Electrophysiologist:  [name], MD   Evaluation Performed:  Follow-Up Visit  Chief Complaint: Follow-up coronary artery disease  History of Present Illness:    Derrick [name] is a 78 y.o. male with History of coronary artery disease, paroxysmal atrial fibrillation, hypertension, and mixed hyperlipidemia, presenting for follow-up evaluation via video conferencing in light of the COVID-19 pandemic.  The patient does not have symptoms concerning for COVID-19 infection (fever, chills, cough, or new shortness of breath).    Patient had a inferior wall MI and underwent DES to RCA in 2012.  He also had moderate left main disease 50 to 60% with FFR of 0.7, 40 to 50% proximal to mid LAD disease, 30% D1, 50% proximal left circumflex, 80% RPDA lesion.  FFR was felt to be negative therefore medical therapy was recommended.  He underwent permanent pacemaker placement in 2017 for paroxysmal atrial fibrillation with bradycardic episode.  Myoview performed on 05/29/2018 showed EF 51%, medium sized moderate intensity basal to apical inferior and inferolateral perfusion  defect consistent with prior MI with no ischemia, overall low risk study.  Echocardiogram obtained on 09/27/2018 showed EF 50 to 55%, hypokinesis of the apical myocardium, grade 1 DD, mild aortic stenosis, mildly dilated ascending aorta.His last device interrogation showed a run of nonsustained VT and he was started on metoprolol.  The patient underwent left inguinal hernia repair in February without complication.  He feels that he's fully recovered. His brother, [name] (also a patient of mine), passed away last week.   The patient reports development of upper back pain with walking.  This is been his anginal equivalent in the past.  He describes a pressure and aching sensation that occurs when his heart rate reaches 120 bpm.  This is been a consistent finding over the past 4 to 6 weeks.  He slows down in the pain improves.  There is associated shortness of breath and fatigue.  He feels like he has been much more tired lately.  He denies resting chest or back discomfort.  He denies orthopnea, PND, or leg swelling.  He has had no medication changes.  His blood pressure has been very well controlled averaging less than 120/80 mmHg.  Past Medical History:  Diagnosis Date   Anemia    BENIGN PROSTATIC HYPERTROPHY, WITH OBSTRUCTION 05/28/2010   CAD (coronary artery disease)    a. s/p NSTEMI 06/01/11: DES to RCA;  b. cath 06/25/11:   dLM 50-60% (FFR 0.87), prox to mid LAD 40-50%, D1 50%, pCFX 50%, RCA stent ok, dPDA 80%, EF 55-60%.  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   DISH (diffuse idiopathic skeletal hyperostosis) 12/02/2015   Diverticulitis    GENERALIZED OSTEOARTHROSIS UNSPECIFIED SITE 05/28/2010   GERD 05/28/2010   Heart murmur    History of gout    HYPERLIPIDEMIA 05/28/2010   Hypertension    IgM lambda paraproteinemia    Intrinsic asthma, 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   "associated w/lung sarcoidosis"   Malignant neoplasm of descending colon ([place]) 05/28/2010   MITRAL VALVE PROLAPSE 05/28/2010   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   NSTEMI (non-ST elevated myocardial infarction) ([place]) 06/03/11   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   OSA on CPAP 05/28/2010   PALPITATIONS, CHRONIC 05/28/2010   Parotid gland pain 2010   infection   Peripheral neuropathy    "tx'd w/targeted chemo" (05/12/2016)   Pneumonia 2-3 times   Presence of permanent cardiac pacemaker    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   Sarcoidosis    pulmonalis   UNSPECIFIED INFLAMMATORY AND TOXIC NEUROPATHY 05/28/2010   Vision loss    VITAMIN D DEFICIENCY 05/28/2010   Past Surgical History:  Procedure Laterality Date   APPENDECTOMY  1954   CATARACT EXTRACTION W/ INTRAOCULAR LENS IMPLANT Left    COLON SURGERY  2000   decending colon    CORONARY ANGIOPLASTY WITH STENT PLACEMENT  06/03/11   ELECTROPHYSIOLOGIC STUDY N/A 05/12/2016   Procedure: Cardioversion;  Surgeon: [name], MD;  Location: [place] [facility];  Service: Cardiovascular;  Laterality: N/A;   EP IMPLANTABLE DEVICE N/A 05/12/2016   Procedure: Pacemaker Implant;  Surgeon: [name], MD;  Location: [place] [facility];  Service: Cardiovascular;  Laterality: N/A;   EYE SURGERY     INGUINAL HERNIA REPAIR Left 02/21/2019   Procedure: LEFT INGUINAL HERNIA REPAIR WITH MESH;  Surgeon: [name], MD;  Location: [place];  Service: General;  Laterality: Left;   INSERT / REPLACE / REMOVE PACEMAKER  05/12/2016   KNEE  ARTHROSCOPY Right 2003   LEFT HEART CATHETERIZATION WITH CORONARY ANGIOGRAM N/A 03/31/2015   Procedure: LEFT HEART CATHETERIZATION WITH CORONARY ANGIOGRAM;  Surgeon: [name], MD;  Location: [facility];  Service: Cardiovascular;  Laterality: N/A;   LUNG SURGERY  2001   "open lung dissection"   MOHS SURGERY Left ~ 2008   ear   PROSTATE BIOPSY  2010   TONSILLECTOMY  1950     Current Meds  Medication Sig   albuterol (PROVENTIL HFA;VENTOLIN HFA) 108 (90 Base) MCG/ACT inhaler Inhale 2 puffs into the lungs every 6 (six) hours as needed for wheezing or shortness of breath.   amLODipine (NORVASC) 10 MG tablet TAKE 1 TABLET BY MOUTH ONCE DAILY   apixaban (ELIQUIS) 5 MG TABS tablet Take 1 tablet (5 mg total) by mouth 2 (two) times daily.   atorvastatin (LIPITOR) 20 MG tablet Take 1 tablet (20 mg total) by mouth daily at 6 PM.   bimatoprost (LUMIGAN) 0.01 % SOLN Place 1 drop into both eyes at bedtime.   Cholecalciferol 25 MCG (1000 UT) tablet Take 1,000 Units by mouth daily.  dorzolamide-timolol (COSOPT) 22.3-6.8 MG/ML ophthalmic solution Place 1 drop into both eyes 2 (two) times daily.    finasteride (PROSCAR) 5 MG tablet Take 5 mg by mouth daily.    fluticasone (FLONASE) 50 MCG/ACT nasal spray Place 2 sprays into both nostrils 2 (two) times daily.   Fluticasone Propionate, Inhal, (FLOVENT DISKUS) 100 MCG/BLIST AEPB Inhale 1 Inhaler into the lungs 2 (two) times daily.   metoprolol tartrate (LOPRESSOR) 25 MG tablet Take 1 tablet (25 mg total) by mouth 2 (two) times daily.   MULTAQ 400 MG tablet TAKE 1 TABLET BY MOUTH TWICE DAILY WITH MEALS   pantoprazole (PROTONIX) 40 MG tablet TAKE 1 TABLET BY MOUTH ONCE DAILY   tadalafil (CIALIS) 5 MG tablet Take 5 mg by mouth daily.    triamterene-hydrochlorothiazide (MAXZIDE-25) 37.5-25 MG tablet TAKE 1 TABLET BY MOUTH ONCE DAILY   Current Facility-Administered Medications for the 05/07/19 encounter (Telemedicine) with [name],  MD  Medication   0.9 %  sodium chloride infusion     Allergies:   Patient has no known allergies.   Social History   Tobacco Use   Smoking status: Never Smoker   Smokeless tobacco: Never Used  Substance Use Topics   Alcohol use: Yes    Comment: occosional   Drug use: No     Family Hx: The patient's family history includes Arrhythmia (age of onset: 35) in his brother; Arthritis in his mother; Cancer in his brother; Colon polyps in his mother; Congestive Heart Failure in his brother; Coronary artery disease in his brother and mother; Heart attack in his brother; Heart disease in his mother.  ROS:   Please see the history of present illness.    All other systems reviewed and are negative.   Prior CV studies:   The following studies were reviewed today:  CT abdomen/pelvis 11/13/2018: IMPRESSION: 1. No acute findings within the abdomen or pelvis. 2.  Aortic Atherosclerosis (ICD10-I70.0). 3. Nonobstructing right renal calculus. 4. Prostate gland enlargement.  Echo 09/27/2018: Study Conclusions  - Left ventricle: The cavity size was normal. Wall thickness was   increased in a pattern of mild LVH. Systolic function was normal.   The estimated ejection fraction was in the range of 50% to 55%.   There is hypokinesis of the apical myocardium. Doppler parameters   are consistent with abnormal left ventricular relaxation (grade 1   diastolic dysfunction). - Aortic valve: Valve mobility was restricted. There was mild   stenosis. - Ascending aorta: The ascending aorta was mildly dilated. - Mitral valve: Calcified annulus. - Left atrium: The atrium was mildly dilated.  Impressions:  - Apical hypokinesis (possibly related to pacing); overall   preserved LV function; mild diastolic dysfunction; mild LVH;   calcified aortic valve with mild AS by doppler (mean gradient 14   mmHg); mildly dilated ascending aorta; mild LAE.  Myocardial perfusion study 05/29/2018: Study  Highlights     Nuclear stress EF: 51%.  This is a low risk study.  The left ventricular ejection fraction is mildly decreased (45-54%).   1. EF 51% with apical hypokinesis.  2. Medium-sized, moderate intensity basal to apical inferior and inferolateral perfusion defect.  This looks like prior infarction with no ischemia, though wall motion is normal in the inferior and lateral walls. .   Overall, I think this is a low risk study with no evidence for ischemia.     Labs/Other Tests and Data Reviewed:    EKG:  An ECG dated 01/23/2019 was personally reviewed  today and demonstrated:  AV sequential pacing 70 bpm  Recent Labs: 08/17/2018: TSH 2.02 02/13/2019: ALT 19; BUN 13; Creatinine, Ser 0.97; Hemoglobin 14.3; Platelets 191; Potassium 3.8; Sodium 138   Recent Lipid Panel Lab Results  Component Value Date/Time   CHOL 154 05/29/2018 08:06 AM   TRIG 52 05/29/2018 08:06 AM   HDL 60 05/29/2018 08:06 AM   CHOLHDL 2.6 05/29/2018 08:06 AM   CHOLHDL 2.7 03/19/2016 09:22 AM   LDLCALC 84 05/29/2018 08:06 AM    Wt Readings from Last 3 Encounters:  05/07/19 182 lb 8 oz (82.8 kg)  03/23/19 184 lb (83.5 kg)  02/21/19 182 lb (82.6 kg)     Objective:    Vital Signs:  BP (!) 119/58 (BP Location: Left Arm, Patient Position: Sitting, Cuff Size: Normal)    Pulse 60    Temp 98.6 F (37 C)    Ht 5\' 10"  (1.778 m)    Wt 182 lb 8 oz (82.8 kg)    SpO2 98%    BMI 26.19 kg/m    VITAL SIGNS:  reviewed  Remaining exam not performed because of telemedicine nature of this visit.  The patient is alert, oriented, and well appearing.  He is in no respiratory distress with normal conversation.  ASSESSMENT & PLAN:    1. Coronary artery disease, native vessel, with angina: Patient appears stable.  His most recent nuclear scan is reviewed with no major areas of ischemia.  He has now developed symptoms that are very typical of exertional angina.  Fortunately he is not having symptoms of unstable angina.   However, this patient has known moderate left main disease by most recent heart catheterization in 2016.  He also has a history of inferior MI with RCA stenting.  I think his coronary anatomy places him at much higher risk.  In addition, he was noted to have nonsustained ventricular tachycardia at the time of his last device interrogation and he was started on a beta-blocker.  With the development of anginal symptoms, I have recommended proceeding with cardiac catheterization and possible PCI.  He understands that if his left main stenosis has progressed, he would likely be referred for surgical consultation.  He will be instructed on holding apixaban prior to his heart catheterization procedure. I have reviewed the risks, indications, and alternatives to cardiac catheterization, possible angioplasty, and stenting with the patient. Risks include but are not limited to bleeding, infection, vascular injury, stroke, myocardial infection, arrhythmia, kidney injury, radiation-related injury in the case of prolonged fluoroscopy use, emergency cardiac surgery, and death. The patient understands the risks of serious complication is 1-2 in 9767 with diagnostic cardiac cath and 1-2% or less with angioplasty/stenting.   I also recommended increasing metoprolol to 50 mg twice daily to help with his anginal symptoms.  Because his blood pressure has been running in the low normal range, I recommended that he decrease amlodipine to 5 mg daily.  2. Paroxysmal atrial fibrillation: Appropriately anticoagulated with apixaban.  Followed by Dr. Curt Bears after undergoing permanent pacemaker placement. 3. Essential hypertension: Blood pressure well controlled.  Antihypertensive changes as outlined above related to adjustment and antianginal therapy. 4. Mixed hyperlipidemia: Liver function test from February 2020 are within normal limits.  Last lipids May 29, 2018 show total cholesterol 154, HDL 60, LDL 84 mg/dL.  COVID-19  Education: The signs and symptoms of COVID-19 were discussed with the patient and how to seek care for testing (follow up with PCP or arrange E-visit).  The importance  of social distancing was discussed today.  Time:   Today, I have spent 30 minutes with the patient with telehealth technology discussing the above problems.     Medication Adjustments/Labs and Tests Ordered: Current medicines are reviewed at length with the patient today.  Concerns regarding medicines are outlined above.   Tests Ordered: No orders of the defined types were placed in this encounter.   Medication Changes: No orders of the defined types were placed in this encounter.   Disposition:  Follow up in 6 month(s)  Signed, [name], MD  05/07/2019 8:44 AM    [place]

## 2019-05-07 NOTE — Patient Instructions (Signed)
Medication Instructions:  1) INCREASE METOPROLOL to 50 mg twice daily 2) DECREASE AMLODIPINE to 5 mg daily  Testing/Procedures: Your physician has requested that you have a cardiac catheterization. Cardiac catheterization is used to diagnose and/or treat various heart conditions. Doctors may recommend this procedure for a number of different reasons. The most common reason is to evaluate chest pain. Chest pain can be a symptom of coronary artery disease (CAD), and cardiac catheterization can show whether plaque is narrowing or blocking your heart's arteries. This procedure is also used to evaluate the valves, as well as measure the blood flow and oxygen levels in different parts of your heart. For further information please visit [email or web address]. Please follow instruction sheet, as given. You will be called with catheterization instructions and information.

## 2019-05-08 ENCOUNTER — Telehealth: Payer: Self-pay | Admitting: Cardiovascular Disease

## 2019-05-08 NOTE — Telephone Encounter (Signed)
  Patient would like to speak to Sammuel Bailiff in regards to scheduling his preadmission for his procedure

## 2019-05-08 NOTE — Telephone Encounter (Signed)
Reiterated to Mr. Hanko that COVID testing will only take place at the [facility] drive thru site at this time.  He understands the procedure nurse will call him Thursday to confirm everything again.

## 2019-05-11 ENCOUNTER — Other Ambulatory Visit: Payer: Self-pay

## 2019-05-11 ENCOUNTER — Other Ambulatory Visit (HOSPITAL_COMMUNITY)
Admission: RE | Admit: 2019-05-11 | Discharge: 2019-05-11 | Disposition: A | Discharge status: Home / Self Care | Payer: Medicare Other | Source: Ambulatory Visit | Attending: Cardiovascular Disease | Admitting: Cardiovascular Disease

## 2019-05-11 DIAGNOSIS — G8929 Other chronic pain: Secondary | ICD-10-CM | POA: Diagnosis not present

## 2019-05-11 DIAGNOSIS — Z79899 Other long term (current) drug therapy: Secondary | ICD-10-CM | POA: Diagnosis not present

## 2019-05-11 DIAGNOSIS — Z950 Presence of cardiac pacemaker: Secondary | ICD-10-CM | POA: Diagnosis not present

## 2019-05-11 DIAGNOSIS — I25118 Atherosclerotic heart disease of native coronary artery with other forms of angina pectoris: Secondary | ICD-10-CM | POA: Diagnosis not present

## 2019-05-11 DIAGNOSIS — G4733 Obstructive sleep apnea (adult) (pediatric): Secondary | ICD-10-CM | POA: Diagnosis not present

## 2019-05-11 DIAGNOSIS — Z1159 Encounter for screening for other viral diseases: Secondary | ICD-10-CM | POA: Diagnosis not present

## 2019-05-11 DIAGNOSIS — D860 Sarcoidosis of lung: Secondary | ICD-10-CM | POA: Diagnosis not present

## 2019-05-11 DIAGNOSIS — I480 Paroxysmal atrial fibrillation: Secondary | ICD-10-CM | POA: Diagnosis not present

## 2019-05-11 DIAGNOSIS — J449 Chronic obstructive pulmonary disease, unspecified: Secondary | ICD-10-CM | POA: Diagnosis not present

## 2019-05-11 DIAGNOSIS — Z7901 Long term (current) use of anticoagulants: Secondary | ICD-10-CM | POA: Diagnosis not present

## 2019-05-11 DIAGNOSIS — Z955 Presence of coronary angioplasty implant and graft: Secondary | ICD-10-CM | POA: Diagnosis not present

## 2019-05-11 DIAGNOSIS — I252 Old myocardial infarction: Secondary | ICD-10-CM | POA: Diagnosis not present

## 2019-05-11 DIAGNOSIS — G6181 Chronic inflammatory demyelinating polyneuritis: Secondary | ICD-10-CM | POA: Diagnosis not present

## 2019-05-11 DIAGNOSIS — Z7951 Long term (current) use of inhaled steroids: Secondary | ICD-10-CM | POA: Diagnosis not present

## 2019-05-11 DIAGNOSIS — K219 Gastro-esophageal reflux disease without esophagitis: Secondary | ICD-10-CM | POA: Diagnosis not present

## 2019-05-11 DIAGNOSIS — I080 Rheumatic disorders of both mitral and aortic valves: Secondary | ICD-10-CM | POA: Diagnosis not present

## 2019-05-11 DIAGNOSIS — E782 Mixed hyperlipidemia: Secondary | ICD-10-CM | POA: Diagnosis not present

## 2019-05-11 DIAGNOSIS — Z85038 Personal history of other malignant neoplasm of large intestine: Secondary | ICD-10-CM | POA: Diagnosis not present

## 2019-05-11 DIAGNOSIS — I10 Essential (primary) hypertension: Secondary | ICD-10-CM | POA: Diagnosis not present

## 2019-05-12 LAB — NOVEL CORONAVIRUS, NAA (HOSP ORDER, SEND-OUT TO REF LAB; TAT 18-24 HRS): SARS-CoV-2, NAA: NOT DETECTED

## 2019-05-14 ENCOUNTER — Telehealth: Payer: Self-pay | Admitting: *Deleted

## 2019-05-14 NOTE — Telephone Encounter (Signed)
See phone note 05/14/19, procedure instructions reviewed with patient.

## 2019-05-14 NOTE — Telephone Encounter (Addendum)
Pt contacted pre-catheterization scheduled at [facility] for: Tuesday May 15, 2019 1 PM Verified arrival time and place: [place] Entrance A at: 11 AM Covid-19-5/15/20 not detected  No solid food after midnight prior to cath, clear liquids until 5 AM day of procedure. Contrast allergy: no  Hold: Apixaban-05/13/19 until post procedure. Triamterene/HCT-AM of procedure. Cialis-until post procedure  Except hold medications AM meds can be  taken pre-cath with sip of water including: ASA 81 mg  Confirmed patient has responsible person to drive home post procedure and observe 24 hours after arriving home   Pt advised due to Covid-19 pandemic, [facility] is restricting visitors and only patients should present for check-in prior to their procedure. People will not be allowed to enter [facility] with the patient. At this time [facility] is not allowing visitors to all [facility] campuses.    Per Dr Leanne Chang Daily Covid Update 05/08/19: Universal masks: Effective immediately, we are requiring all ED patients and all ambulatory care patients to wear a universal mask, which we will provide. In-patients will have a mask at their bedside and will only be asked to put it on when a caregiver comes into their room.           COVID-19 Pre-Screening Questions:  . In the past 7 to 10 days have you had a cough,  shortness of breath, headache, congestion, fever, body aches, chills, sore throat, or sudden loss of taste or sense of smell? no . Have you been around anyone with known Covid 19 ? no . Have you been around anyone who is awaiting Covid 19 test results in the past 7 to 10 days? no . Have you been around anyone who has been exposed to Covid 19, or has mentioned symptoms of Covid 19 within the past 7 to 10 days? no  I reviewed procedure instructions, visitor/mask, Covid-19 screening questions with patient, he verbalized understanding, thanked me for call.

## 2019-05-15 ENCOUNTER — Encounter (HOSPITAL_COMMUNITY)
Admission: RE | Disposition: A | Discharge status: Home / Self Care | Payer: Self-pay | Source: Home / Self Care | Attending: Cardiovascular Disease

## 2019-05-15 ENCOUNTER — Ambulatory Visit (HOSPITAL_COMMUNITY)
Admission: RE | Admit: 2019-05-15 | Discharge: 2019-05-15 | Disposition: A | Discharge status: Home / Self Care | Payer: Medicare Other | Attending: Cardiovascular Disease | Admitting: Cardiovascular Disease

## 2019-05-15 ENCOUNTER — Other Ambulatory Visit: Payer: Self-pay

## 2019-05-15 DIAGNOSIS — I252 Old myocardial infarction: Secondary | ICD-10-CM | POA: Diagnosis not present

## 2019-05-15 DIAGNOSIS — K219 Gastro-esophageal reflux disease without esophagitis: Secondary | ICD-10-CM | POA: Insufficient documentation

## 2019-05-15 DIAGNOSIS — Z950 Presence of cardiac pacemaker: Secondary | ICD-10-CM | POA: Insufficient documentation

## 2019-05-15 DIAGNOSIS — I480 Paroxysmal atrial fibrillation: Secondary | ICD-10-CM | POA: Insufficient documentation

## 2019-05-15 DIAGNOSIS — Z7901 Long term (current) use of anticoagulants: Secondary | ICD-10-CM | POA: Insufficient documentation

## 2019-05-15 DIAGNOSIS — I080 Rheumatic disorders of both mitral and aortic valves: Secondary | ICD-10-CM | POA: Insufficient documentation

## 2019-05-15 DIAGNOSIS — J449 Chronic obstructive pulmonary disease, unspecified: Secondary | ICD-10-CM | POA: Insufficient documentation

## 2019-05-15 DIAGNOSIS — E782 Mixed hyperlipidemia: Secondary | ICD-10-CM | POA: Diagnosis not present

## 2019-05-15 DIAGNOSIS — I25119 Atherosclerotic heart disease of native coronary artery with unspecified angina pectoris: Secondary | ICD-10-CM | POA: Diagnosis not present

## 2019-05-15 DIAGNOSIS — Z7951 Long term (current) use of inhaled steroids: Secondary | ICD-10-CM | POA: Insufficient documentation

## 2019-05-15 DIAGNOSIS — Z955 Presence of coronary angioplasty implant and graft: Secondary | ICD-10-CM | POA: Insufficient documentation

## 2019-05-15 DIAGNOSIS — G4733 Obstructive sleep apnea (adult) (pediatric): Secondary | ICD-10-CM | POA: Diagnosis not present

## 2019-05-15 DIAGNOSIS — G8929 Other chronic pain: Secondary | ICD-10-CM | POA: Insufficient documentation

## 2019-05-15 DIAGNOSIS — I10 Essential (primary) hypertension: Secondary | ICD-10-CM | POA: Diagnosis not present

## 2019-05-15 DIAGNOSIS — I25118 Atherosclerotic heart disease of native coronary artery with other forms of angina pectoris: Secondary | ICD-10-CM | POA: Diagnosis not present

## 2019-05-15 DIAGNOSIS — G6181 Chronic inflammatory demyelinating polyneuritis: Secondary | ICD-10-CM | POA: Insufficient documentation

## 2019-05-15 DIAGNOSIS — Z85038 Personal history of other malignant neoplasm of large intestine: Secondary | ICD-10-CM | POA: Diagnosis not present

## 2019-05-15 DIAGNOSIS — Z79899 Other long term (current) drug therapy: Secondary | ICD-10-CM | POA: Insufficient documentation

## 2019-05-15 DIAGNOSIS — Z1159 Encounter for screening for other viral diseases: Secondary | ICD-10-CM | POA: Insufficient documentation

## 2019-05-15 DIAGNOSIS — D860 Sarcoidosis of lung: Secondary | ICD-10-CM | POA: Insufficient documentation

## 2019-05-15 HISTORY — PX: CORONARY PRESSURE/FFR STUDY: CATH118243

## 2019-05-15 HISTORY — PX: LEFT HEART CATH AND CORONARY ANGIOGRAPHY: CATH118249

## 2019-05-15 LAB — CBC
HCT: 39.9 % (ref 39.0–52.0)
Hemoglobin: 13.7 g/dL (ref 13.0–17.0)
MCH: 31.6 pg (ref 26.0–34.0)
MCHC: 34.3 g/dL (ref 30.0–36.0)
MCV: 91.9 fL (ref 80.0–100.0)
Platelets: 194 10*3/uL (ref 150–400)
RBC: 4.34 MIL/uL (ref 4.22–5.81)
RDW: 12.8 % (ref 11.5–15.5)
WBC: 5.1 10*3/uL (ref 4.0–10.5)
nRBC: 0 % (ref 0.0–0.2)

## 2019-05-15 LAB — BASIC METABOLIC PANEL
Anion gap: 9 (ref 5–15)
BUN: 13 mg/dL (ref 8–23)
CO2: 27 mmol/L (ref 22–32)
Calcium: 10.1 mg/dL (ref 8.9–10.3)
Chloride: 102 mmol/L (ref 98–111)
Creatinine, Ser: 0.96 mg/dL (ref 0.61–1.24)
GFR calc Af Amer: >60 mL/min (ref >60)
GFR calc non Af Amer: >60 mL/min (ref >60)
Glucose, Bld: 93 mg/dL (ref 70–99)
Potassium: 3.9 mmol/L (ref 3.5–5.1)
Sodium: 138 mmol/L (ref 135–145)

## 2019-05-15 LAB — POCT ACTIVATED CLOTTING TIME: Activated Clotting Time: 230 seconds

## 2019-05-15 SURGERY — LEFT HEART CATH AND CORONARY ANGIOGRAPHY
Anesthesia: LOCAL

## 2019-05-15 MED ORDER — HEPARIN (PORCINE) IN NACL 1000-0.9 UT/500ML-% IV SOLN
INTRAVENOUS | Status: DC | PRN
  Administered 2019-05-15 (×2): 500 mL

## 2019-05-15 MED ORDER — LABETALOL HCL 5 MG/ML IV SOLN: 10.0000 mg | INTRAVENOUS | Status: DC | PRN

## 2019-05-15 MED ORDER — ONDANSETRON HCL 4 MG/2ML IJ SOLN: 4.0000 mg | Freq: Four times a day (QID) | INTRAMUSCULAR | Status: DC | PRN

## 2019-05-15 MED ORDER — SODIUM CHLORIDE 0.9 % WEIGHT BASED INFUSION: 1.0000 mL/kg/h | INTRAVENOUS | Status: DC

## 2019-05-15 MED ORDER — HEPARIN SODIUM (PORCINE) 1000 UNIT/ML IJ SOLN
INTRAMUSCULAR | Status: DC | PRN
  Administered 2019-05-15: 4500 [IU] via INTRAVENOUS
  Administered 2019-05-15: 3000 [IU] via INTRAVENOUS

## 2019-05-15 MED ORDER — SODIUM CHLORIDE 0.9% FLUSH: 3.0000 mL | Freq: Two times a day (BID) | INTRAVENOUS | Status: DC

## 2019-05-15 MED ORDER — LIDOCAINE HCL (PF) 1 % IJ SOLN
INTRAMUSCULAR | Status: AC
  Filled 2019-05-15: qty 30

## 2019-05-15 MED ORDER — ACETAMINOPHEN 325 MG PO TABS: 650.0000 mg | ORAL_TABLET | ORAL | Status: DC | PRN

## 2019-05-15 MED ORDER — SODIUM CHLORIDE 0.9 % WEIGHT BASED INFUSION
3.0000 mL/kg/h | INTRAVENOUS | Status: AC
  Administered 2019-05-15: 3 mL/kg/h via INTRAVENOUS

## 2019-05-15 MED ORDER — FENTANYL CITRATE (PF) 100 MCG/2ML IJ SOLN
INTRAMUSCULAR | Status: AC
  Filled 2019-05-15: qty 2

## 2019-05-15 MED ORDER — SODIUM CHLORIDE 0.9% FLUSH: 3.0000 mL | INTRAVENOUS | Status: DC | PRN

## 2019-05-15 MED ORDER — VERAPAMIL HCL 2.5 MG/ML IV SOLN
INTRAVENOUS | Status: DC | PRN
  Administered 2019-05-15: 10 mL via INTRA_ARTERIAL

## 2019-05-15 MED ORDER — VERAPAMIL HCL 2.5 MG/ML IV SOLN
INTRAVENOUS | Status: AC
  Filled 2019-05-15: qty 2

## 2019-05-15 MED ORDER — HEPARIN SODIUM (PORCINE) 1000 UNIT/ML IJ SOLN
INTRAMUSCULAR | Status: AC
  Filled 2019-05-15: qty 1

## 2019-05-15 MED ORDER — SODIUM CHLORIDE 0.9 % IV SOLN: 250.0000 mL | INTRAVENOUS | Status: DC | PRN

## 2019-05-15 MED ORDER — IOHEXOL 350 MG/ML SOLN
INTRAVENOUS | Status: DC | PRN
  Administered 2019-05-15: 80 mL via INTRA_ARTERIAL

## 2019-05-15 MED ORDER — LIDOCAINE HCL (PF) 1 % IJ SOLN
INTRAMUSCULAR | Status: DC | PRN
  Administered 2019-05-15: 2 mL

## 2019-05-15 MED ORDER — HEPARIN (PORCINE) IN NACL 1000-0.9 UT/500ML-% IV SOLN
INTRAVENOUS | Status: AC
  Filled 2019-05-15: qty 1000

## 2019-05-15 MED ORDER — ASPIRIN 81 MG PO CHEW: 81.0000 mg | CHEWABLE_TABLET | ORAL | Status: DC

## 2019-05-15 MED ORDER — FENTANYL CITRATE (PF) 100 MCG/2ML IJ SOLN
INTRAMUSCULAR | Status: DC | PRN
  Administered 2019-05-15 (×2): 25 ug via INTRAVENOUS

## 2019-05-15 MED ORDER — HYDRALAZINE HCL 20 MG/ML IJ SOLN: 10.0000 mg | INTRAMUSCULAR | Status: DC | PRN

## 2019-05-15 MED ORDER — MIDAZOLAM HCL 2 MG/2ML IJ SOLN
INTRAMUSCULAR | Status: DC | PRN
  Administered 2019-05-15 (×2): 1 mg via INTRAVENOUS

## 2019-05-15 MED ORDER — MIDAZOLAM HCL 2 MG/2ML IJ SOLN
INTRAMUSCULAR | Status: AC
  Filled 2019-05-15: qty 2

## 2019-05-15 MED ORDER — NITROGLYCERIN 1 MG/10 ML FOR IR/CATH LAB
INTRA_ARTERIAL | Status: DC | PRN
  Administered 2019-05-15: 100 ug via INTRACORONARY

## 2019-05-15 SURGICAL SUPPLY — 15 items
CATH INFINITI 5 FR 3DRC (CATHETERS) ×1 IMPLANT
CATH INFINITI 5FR AL1 (CATHETERS) ×1 IMPLANT
CATH INFINITI 5FR MULTPACK ANG (CATHETERS) ×1 IMPLANT
CATH LAUNCHER 5F EBU3.5 (CATHETERS) ×1 IMPLANT
DEVICE RAD COMP TR BAND LRG (VASCULAR PRODUCTS) ×1 IMPLANT
GLIDESHEATH SLEND SS 6F .021 (SHEATH) ×1 IMPLANT
GUIDEWIRE INQWIRE 1.5J.035X260 (WIRE) IMPLANT
GUIDEWIRE PRESSURE COMET II (WIRE) ×1 IMPLANT
INQWIRE 1.5J .035X260CM (WIRE) ×2
KIT ESSENTIALS PG (KITS) ×1 IMPLANT
KIT HEART LEFT (KITS) ×2 IMPLANT
PACK CARDIAC CATHETERIZATION (CUSTOM PROCEDURE TRAY) ×2 IMPLANT
TRANSDUCER W/STOPCOCK (MISCELLANEOUS) ×2 IMPLANT
TUBING CIL FLEX 10 FLL-RA (TUBING) ×2 IMPLANT
WIRE EMERALD ST .035X150CM (WIRE) ×1 IMPLANT

## 2019-05-15 NOTE — Discharge Instructions (Signed)
Radial Site Care ° °This sheet gives you information about how to care for yourself after your procedure. Your health care provider may also give you more specific instructions. If you have problems or questions, contact your health care provider. °What can I expect after the procedure? °After the procedure, it is common to have: °· Bruising and tenderness at the catheter insertion area. °Follow these instructions at home: °Medicines °· Take over-the-counter and prescription medicines only as told by your health care provider. °Insertion site care °· Follow instructions from your health care provider about how to take care of your insertion site. Make sure you: °? Wash your hands with soap and water before you change your bandage (dressing). If soap and water are not available, use hand sanitizer. °? Change your dressing as told by your health care provider. °? Leave stitches (sutures), skin glue, or adhesive strips in place. These skin closures may need to stay in place for 2 weeks or longer. If adhesive strip edges start to loosen and curl up, you may trim the loose edges. Do not remove adhesive strips completely unless your health care provider tells you to do that. °· Check your insertion site every day for signs of infection. Check for: °? Redness, swelling, or pain. °? Fluid or blood. °? Pus or a bad smell. °? Warmth. °· Do not take baths, swim, or use a hot tub until your health care provider approves. °· You may shower 24-48 hours after the procedure, or as directed by your health care provider. °? Remove the dressing and gently wash the site with plain soap and water. °? Pat the area dry with a clean towel. °? Do not rub the site. That could cause bleeding. °· Do not apply powder or lotion to the site. °Activity ° °· For 24 hours after the procedure, or as directed by your health care provider: °? Do not flex or bend the affected arm. °? Do not push or pull heavy objects with the affected arm. °? Do not  drive yourself home from the hospital or clinic. You may drive 24 hours after the procedure unless your health care provider tells you not to. °? Do not operate machinery or power tools. °· Do not lift anything that is heavier than 10 lb (4.5 kg), or the limit that you are told, until your health care provider says that it is safe. °· Ask your health care provider when it is okay to: °? Return to work or school. °? Resume usual physical activities or sports. °? Resume sexual activity. °General instructions °· If the catheter site starts to bleed, raise your arm and put firm pressure on the site. If the bleeding does not stop, get help right away. This is a medical emergency. °· If you went home on the same day as your procedure, a responsible adult should be with you for the first 24 hours after you arrive home. °· Keep all follow-up visits as told by your health care provider. This is important. °Contact a health care provider if: °· You have a fever. °· You have redness, swelling, or yellow drainage around your insertion site. °Get help right away if: °· You have unusual pain at the radial site. °· The catheter insertion area swells very fast. °· The insertion area is bleeding, and the bleeding does not stop when you hold steady pressure on the area. °· Your arm or hand becomes pale, cool, tingly, or numb. °These symptoms may represent a serious problem   that is an emergency. Do not wait to see if the symptoms will go away. Get medical help right away. Call your local emergency services (911 in the [place] Do not drive yourself to the hospital. °Summary °· After the procedure, it is common to have bruising and tenderness at the site. °· Follow instructions from your health care provider about how to take care of your radial site wound. Check the wound every day for signs of infection. °· Do not lift anything that is heavier than 10 lb (4.5 kg), or the limit that you are told, until your health care provider says  that it is safe. °This information is not intended to replace advice given to you by your health care provider. Make sure you discuss any questions you have with your health care provider. °Document Released: 01/15/2011 Document Revised: 01/18/2018 Document Reviewed: 01/18/2018 °Elsevier Interactive Patient Education © 2019 [employer] ° °

## 2019-05-15 NOTE — Progress Notes (Signed)
D/c instructions reviewed with wife, Derrick [name], due to [place] restrictions.  All questions answered and Derrick [name] verbalized understanding

## 2019-05-15 NOTE — Interval H&P Note (Signed)
Cath Lab Visit (complete for each Cath Lab visit)  Clinical Evaluation Leading to the Procedure:   ACS: No.  Non-ACS:    Anginal Classification: CCS II  Anti-ischemic medical therapy: Maximal Therapy (2 or more classes of medications)  Non-Invasive Test Results: No non-invasive testing performed  Prior CABG: No previous CABG      History and Physical Interval Note:  05/15/2019 1:16 PM  Derrick [name]  has presented today for surgery, with the diagnosis of Coronary Artery Disease with angina.  The various methods of treatment have been discussed with the patient and family. After consideration of risks, benefits and other options for treatment, the patient has consented to  Procedure(s): LEFT HEART CATH AND CORONARY ANGIOGRAPHY (N/A) as a surgical intervention.  The patient's history has been reviewed, patient examined, no change in status, stable for surgery.  I have reviewed the patient's chart and labs.  Questions were answered to the patient's satisfaction.     [name]

## 2019-05-16 ENCOUNTER — Other Ambulatory Visit: Payer: Self-pay

## 2019-05-16 ENCOUNTER — Encounter (HOSPITAL_COMMUNITY): Payer: Self-pay | Admitting: Cardiovascular Disease

## 2019-05-16 DIAGNOSIS — I25119 Atherosclerotic heart disease of native coronary artery with unspecified angina pectoris: Secondary | ICD-10-CM

## 2019-05-18 ENCOUNTER — Telehealth (HOSPITAL_COMMUNITY): Payer: Self-pay | Admitting: Radiology

## 2019-05-18 NOTE — Telephone Encounter (Signed)

## 2019-05-22 ENCOUNTER — Ambulatory Visit (HOSPITAL_COMMUNITY): Payer: Medicare Other | Attending: Cardiovascular Disease

## 2019-05-22 ENCOUNTER — Other Ambulatory Visit: Payer: Self-pay

## 2019-05-22 DIAGNOSIS — I25119 Atherosclerotic heart disease of native coronary artery with unspecified angina pectoris: Secondary | ICD-10-CM | POA: Diagnosis not present

## 2019-05-23 ENCOUNTER — Institutional Professional Consult (permissible substitution) (INDEPENDENT_AMBULATORY_CARE_PROVIDER_SITE_OTHER): Payer: Medicare Other | Admitting: Cardiothoracic Surgery

## 2019-05-23 ENCOUNTER — Other Ambulatory Visit: Payer: Self-pay | Admitting: *Deleted

## 2019-05-23 ENCOUNTER — Encounter: Payer: Self-pay | Admitting: Cardiothoracic Surgery

## 2019-05-23 VITALS — BP 159/81 | HR 65 | Temp 98.4°F | Resp 16 | Ht 67.5 in | Wt 182.0 lb

## 2019-05-23 DIAGNOSIS — I2510 Atherosclerotic heart disease of native coronary artery without angina pectoris: Secondary | ICD-10-CM

## 2019-05-23 DIAGNOSIS — I712 Thoracic aortic aneurysm, without rupture, unspecified: Secondary | ICD-10-CM

## 2019-05-23 DIAGNOSIS — D869 Sarcoidosis, unspecified: Secondary | ICD-10-CM

## 2019-05-23 DIAGNOSIS — I25118 Atherosclerotic heart disease of native coronary artery with other forms of angina pectoris: Secondary | ICD-10-CM

## 2019-05-23 DIAGNOSIS — I208 Other forms of angina pectoris: Secondary | ICD-10-CM | POA: Diagnosis not present

## 2019-05-23 DIAGNOSIS — Z01818 Encounter for other preprocedural examination: Secondary | ICD-10-CM

## 2019-05-23 NOTE — Progress Notes (Signed)
PCP is [name], MD Referring Provider is [name], MD  Chief Complaint  Patient presents with  . Coronary Artery Disease    eval for CABG with CATH 05/15/19, 05/22/19  Patient examined, images of recent coronary angiogram and echocardiogram personally reviewed and counseled with patient  HPI: 78 year old nondiabetic non-smoker with history of CAD status post DMI[mild] 2012 treated with PCI of RCA.  He took Plavix for a year.  He now has symptoms of exertional angina relieved by rest.  Cardiac catheterization by Dr. Burt Knack demonstrates a 75% hazy left main stenosis with a patent RCA stent but with 60 to 70% distal stenosis.  Echo shows normal LV function.  He has a mildly calcified aortic valve without significant aortic stenosis or insufficiency.  RV function appears normal. The patient has paroxysmal atrial fibrillation with past  problems with rate control on meds so he had a pacemaker placed in 2017.  He is predominantly in sinus rhythm and is on Eliquis.  Patient has history of pulmonary sarcoidosis.  Diagnosis made with right lung biopsy in 2012 in [place].  He has intermittent problems with wheezing productive cough treated with as needed antibiotics and prednisone taper.  He does not have a pulmonary physician in [place].  Last CT of chest was 2012.  Patient also has history of mono gammopathy, left hemicolectomy for colon cancer remotely and peripheral neuropathy.  He recently had an inguinal hernia repair in February 2020 without anesthesia or bleeding complications.  Past Medical History:  Diagnosis Date  . Anemia   . BENIGN PROSTATIC HYPERTROPHY, WITH OBSTRUCTION 05/28/2010  . CAD (coronary artery disease)    a. s/p NSTEMI 06/01/11: DES to RCA;  b. cath 06/25/11:   dLM 50-60% (FFR 0.87), prox to mid LAD 40-50%, D1 50%, pCFX 50%, RCA stent ok, dPDA 80%, EF 55-60%.  His FFR was felt to be negative and therefore medical therapy was recommended ;  echo 6/12: EF 55-60%, mild  AS   . Chronic back pain   . 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 Colon cancer ([place]) dx'd 2000   "left"  . COLONIC POLYPS, ADENOMATOUS, HX OF 05/28/2010  . Complication of anesthesia    "stopped breathing; related to my sleep apnea" & urinary retention   . COPD (chronic obstructive pulmonary disease) ([place])    "associated w/lung sarcoidosis"  . 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 DISH (diffuse idiopathic skeletal hyperostosis) 12/02/2015  . Diverticulitis   . GENERALIZED OSTEOARTHROSIS UNSPECIFIED SITE 05/28/2010  . GERD 05/28/2010  . Heart murmur   . History of gout   . HYPERLIPIDEMIA 05/28/2010  . Hypertension   . IgM lambda paraproteinemia   . 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 Malignant neoplasm of descending colon ([place]) 05/28/2010  . MITRAL VALVE PROLAPSE 05/28/2010  . 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 NSTEMI (non-ST elevated myocardial infarction) ([place]) 06/03/11  . 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  . OSA on CPAP 05/28/2010  . PALPITATIONS, CHRONIC 05/28/2010  . Parotid gland pain 2010   infection  . Peripheral neuropathy    "tx'd w/targeted chemo" (05/12/2016)  . Pneumonia 2-3 times  . Presence of permanent cardiac pacemaker   . 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 Sarcoidosis    pulmonalis  . UNSPECIFIED INFLAMMATORY AND TOXIC NEUROPATHY 05/28/2010  . Vision loss   . VITAMIN D DEFICIENCY 05/28/2010    Past Surgical History:  Procedure Laterality Date  . APPENDECTOMY  1954  . CATARACT EXTRACTION W/ INTRAOCULAR LENS IMPLANT Left   . COLON SURGERY  2000   decending colon   . CORONARY ANGIOPLASTY WITH STENT PLACEMENT  06/03/11  . ELECTROPHYSIOLOGIC STUDY N/A 05/12/2016   Procedure: Cardioversion;  Surgeon: [name], MD;  Location: [facility];  Service: Cardiovascular;  Laterality: N/A;  . EP IMPLANTABLE DEVICE N/A 05/12/2016   Procedure: Pacemaker  Implant;  Surgeon: [name], MD;  Location: [facility];  Service: Cardiovascular;  Laterality: N/A;  . EYE SURGERY    . INGUINAL HERNIA REPAIR Left 02/21/2019   Procedure: LEFT INGUINAL HERNIA REPAIR WITH MESH;  Surgeon: [name], MD;  Location: [facility];  Service: General;  Laterality: Left;  . INSERT / REPLACE / REMOVE PACEMAKER  05/12/2016  . INTRAVASCULAR PRESSURE WIRE/FFR STUDY N/A 05/15/2019   Procedure: INTRAVASCULAR PRESSURE WIRE/FFR STUDY;  Surgeon: [name], MD;  Location: [facility];  Service: Cardiovascular;  Laterality: N/A;  . KNEE ARTHROSCOPY Right 2003  . LEFT HEART CATH AND CORONARY ANGIOGRAPHY N/A 05/15/2019   Procedure: LEFT HEART CATH AND CORONARY ANGIOGRAPHY;  Surgeon: [name], MD;  Location: [facility];  Service: Cardiovascular;  Laterality: N/A;  . LEFT HEART CATHETERIZATION WITH CORONARY ANGIOGRAM N/A 03/31/2015   Procedure: LEFT HEART CATHETERIZATION WITH CORONARY ANGIOGRAM;  Surgeon: [name], MD;  Location: [facility];  Service: Cardiovascular;  Laterality: N/A;  . LUNG SURGERY  2001   "open lung dissection"  . MOHS SURGERY Left ~ 2008   ear  . PROSTATE BIOPSY  2010  . TONSILLECTOMY  1950    Family History  Problem Relation Age of Onset  . Arthritis Mother        severe  . Coronary artery disease Mother   . Colon polyps Mother   . Heart disease Mother   . Coronary artery disease Brother   . Cancer Brother        choriocarcinoma  . Arrhythmia Brother 45       Afib/Tachycardia  . Heart attack Brother   . Congestive Heart Failure Brother     Social History Social History   Tobacco Use  . Smoking status: Never Smoker  . Smokeless tobacco: Never Used  Substance Use Topics  . Alcohol use: Yes    Comment: occosional  . Drug use: No    Current Outpatient Medications  Medication Sig Dispense Refill  . acetaminophen (TYLENOL) 500 MG tablet Take 1,000 mg by mouth every 6 (six) hours as needed for  moderate pain or headache.    . albuterol (PROVENTIL HFA;VENTOLIN HFA) 108 (90 Base) MCG/ACT inhaler Inhale 2 puffs into the lungs every 6 (six) hours as needed for wheezing or shortness of breath. 1 Inhaler 1  . amLODipine (NORVASC) 5 MG tablet Take 1 tablet (5 mg total) by mouth daily. 90 tablet 3  . apixaban (ELIQUIS) 5 MG TABS tablet Take 1 tablet (5 mg total) by mouth 2 (two) times daily. 180 tablet 1  . atorvastatin (LIPITOR) 20 MG tablet Take 1 tablet (20 mg total) by mouth daily at 6 PM. 90 tablet 3  . bimatoprost (LUMIGAN) 0.01 % SOLN Place 1 drop into both eyes at bedtime.    . Cholecalciferol 25 MCG (1000 UT) tablet Take 1,000 Units by mouth daily.     . dorzolamide-timolol (COSOPT) 22.3-6.8 MG/ML ophthalmic solution Place 1 drop into both eyes 2 (two) times daily.     . finasteride (PROSCAR) 5 MG  tablet Take 5 mg by mouth daily.     . fluticasone (FLONASE) 50 MCG/ACT nasal spray Place 2 sprays into both nostrils 2 (two) times daily.    . Fluticasone Propionate, Inhal, (FLOVENT DISKUS) 100 MCG/BLIST AEPB Inhale 1 Inhaler into the lungs 2 (two) times daily. (Patient taking differently: Inhale 1 Inhaler into the lungs 2 (two) times daily as needed (shortness of breath). ) 60 each 1  . metoprolol tartrate (LOPRESSOR) 50 MG tablet Take 1 tablet (50 mg total) by mouth 2 (two) times daily. 180 tablet 3  . MULTAQ 400 MG tablet TAKE 1 TABLET BY MOUTH TWICE DAILY WITH MEALS (Patient taking differently: Take 400 mg by mouth 2 (two) times a day. ) 180 tablet 1  . Multiple Vitamin (MULTIVITAMIN WITH MINERALS) TABS tablet Take 1 tablet by mouth daily.    . pantoprazole (PROTONIX) 40 MG tablet TAKE 1 TABLET BY MOUTH ONCE DAILY (Patient taking differently: Take 40 mg by mouth daily. ) 90 tablet 3  . Polyethyl Glycol-Propyl Glycol (SYSTANE OP) Place 1 drop into both eyes daily as needed (dry eyes).    . tadalafil (CIALIS) 5 MG tablet Take 5 mg by mouth at bedtime.     . triamterene-hydrochlorothiazide  (MAXZIDE-25) 37.5-25 MG tablet TAKE 1 TABLET BY MOUTH ONCE DAILY 90 tablet 3   No current facility-administered medications for this visit.     No Known Allergies  Review of Systems      Right-hand-dominant      Retired Chief Financial Officer       Never smoker       No history of thoracic trauma        Weight stable, COVID screening negative prior to cardiac cath last week        Recent onset of notable fatigue        No bleeding problems on Eliquis             Review of Systems :  [ y ] = yes, [  ] = no        General :  Weight gain [   ]    Weight loss  [   ]  Fatigue [  ]  Fever [  ]  Chills  [  ]                                          HEENT    Headache [  ]  Dizziness [  ]  Blurred vision [  ] Glaucoma  [  ]                          Nosebleeds [  ] Painful or loose teeth [  ]        Cardiac :  Chest pain/ pressure Blue.Reese  ]  Resting SOB [  ] exertional SOB Blue.Reese  ]                        Orthopnea [  ]  Pedal edema  [  ]  Palpitations [  ] Syncope/presyncope [ ]                         Paroxysmal nocturnal dyspnea [  ]         Pulmonary : cough Blue.Reese  ]  wheezing [  ]  Hemoptysis [  ] Sputum [  ] Snoring [  ]                              Pneumothorax [  ]  Sleep apnea [  ]        GI : Vomiting [  ]  Dysphagia [  ]  Melena  [  ]  Abdominal pain [  ] BRBPR [  ]              Heart burn [  ]  Constipation [  ] Diarrhea  [  ] Colonoscopy [   ]        GU : Hematuria [  ]  Dysuria [  ]  Nocturia [  ] UTI's [  ]        Vascular : Claudication [  ]  Rest pain [  ]  DVT [  ] Vein stripping [  ] leg ulcers [  ]                          TIA [  ] Stroke [  ]  Varicose veins [  ]        NEURO :  Headaches  [  ] Seizures [  ] Vision changes [  ] Paresthesias [  ]                                               Musculoskeletal :  Arthritis Blue.Reese  ] Gout  [  ]  Back pain [  ]  Joint pain [  ]        Skin :  Rash [  ]  Melanoma [  ] Sores [  ]        Heme : Bleeding problems [  ]Clotting Disorders [  ] Anemia [   ]Blood Transfusion [ ]         Endocrine : Diabetes [  ] Heat or Cold intolerance [  ] Polyuria [  ]excessive thirst [ ]         Psych : Depression [  ]  Anxiety [  ]  Psych hospitalizations [  ] Memory change [  ]                                                                            BP (!) 159/81 (BP Location: Left Arm, Patient Position: Sitting, Cuff Size: Normal)   Pulse 65   Temp 98.4 F (36.9 C) (Skin)   Resp 16   Ht 5' 7.5" (1.715 m)   Wt 182 lb (82.6 kg)   SpO2 97% Comment: ON RA  BMI 28.08 kg/m  Physical Exam     Physical Exam  General: Well-nourished healthy-appearing 78 year old HEENT: Normocephalic pupils equal , dentition adequate Neck: Supple without JVD, adenopathy, or bruit Chest: Clear to auscultation, symmetrical breath sounds, no rhonchi, no tenderness  or deformity.  Well-healed right VATS scar Cardiovascular: Regular rate and rhythm, n2/6 murmur of aortic sclerosis, no gallop, peripheral pulses             palpable in all extremities Abdomen:  Soft, nontender, no palpable mass or organomegaly Extremities: Warm, well-perfused, no clubbing cyanosis edema or tenderness,              no venous stasis changes of the legs.  Mild varicosities right lower leg Rectal/GU: Deferred Neuro: Grossly non--focal and symmetrical throughout Skin: Clean and dry without rash or ulceration   Diagnostic Tests: Significant 75% left main stenosis with class III exertional angina normal LV function Patent RCA PCI with distal 60% stenosis  Impression: Patient would benefit from multivessel CABG with grafting to the LAD, circumflex marginal and distal RCA.  Prior to surgery he will need further evaluation of his sarcoid lung disease with a chest CT scan and PFTs-spirometry.  He will return after those test to schedule surgery and to discuss stopping Eliquis prior to surgery.  Plan: Return in approximate 1 week to review tests and discuss CABG  plans.  Len Childs, MD [facility] [phone]

## 2019-05-24 ENCOUNTER — Ambulatory Visit
Admission: RE | Admit: 2019-05-24 | Discharge: 2019-05-24 | Disposition: A | Discharge status: Home / Self Care | Payer: Medicare Other | Source: Ambulatory Visit | Attending: Cardiothoracic Surgery | Admitting: Cardiothoracic Surgery

## 2019-05-24 ENCOUNTER — Other Ambulatory Visit: Payer: Self-pay

## 2019-05-24 ENCOUNTER — Ambulatory Visit (HOSPITAL_COMMUNITY)
Admission: RE | Admit: 2019-05-24 | Discharge: 2019-05-24 | Disposition: A | Discharge status: Home / Self Care | Payer: Medicare Other | Source: Ambulatory Visit | Attending: Cardiothoracic Surgery | Admitting: Cardiothoracic Surgery

## 2019-05-24 DIAGNOSIS — I712 Thoracic aortic aneurysm, without rupture, unspecified: Secondary | ICD-10-CM

## 2019-05-24 DIAGNOSIS — D860 Sarcoidosis of lung: Secondary | ICD-10-CM | POA: Insufficient documentation

## 2019-05-24 DIAGNOSIS — I2510 Atherosclerotic heart disease of native coronary artery without angina pectoris: Secondary | ICD-10-CM | POA: Diagnosis not present

## 2019-05-24 DIAGNOSIS — Z01811 Encounter for preprocedural respiratory examination: Secondary | ICD-10-CM | POA: Insufficient documentation

## 2019-05-24 DIAGNOSIS — Z01818 Encounter for other preprocedural examination: Secondary | ICD-10-CM

## 2019-05-24 LAB — PULMONARY FUNCTION TEST
FEF 25-75 Pre: 2.29 L/sec
FEF2575-%Pred-Pre: 109 %
FEV1-%Pred-Pre: 110 %
FEV1-Pre: 3.21 L
FEV1FVC-%Pred-Pre: 102 %
FEV6-%Pred-Pre: 111 %
FEV6-Pre: 4.22 L
FEV6FVC-%Pred-Pre: 103 %
FVC-%Pred-Pre: 107 %
FVC-Pre: 4.34 L
Pre FEV1/FVC ratio: 74 %
Pre FEV6/FVC Ratio: 97 %

## 2019-05-24 MED ORDER — IOPAMIDOL (ISOVUE-300) INJECTION 61%
75.0000 mL | Freq: Once | INTRAVENOUS | Status: AC | PRN
  Administered 2019-05-24: 15:00:00 75 mL via INTRAVENOUS

## 2019-05-29 ENCOUNTER — Other Ambulatory Visit: Payer: Self-pay | Admitting: Cardiovascular Disease

## 2019-05-29 ENCOUNTER — Other Ambulatory Visit: Payer: Self-pay

## 2019-05-30 ENCOUNTER — Ambulatory Visit: Payer: Medicare Other | Admitting: Cardiothoracic Surgery

## 2019-05-31 ENCOUNTER — Other Ambulatory Visit: Payer: Self-pay

## 2019-06-01 ENCOUNTER — Ambulatory Visit (INDEPENDENT_AMBULATORY_CARE_PROVIDER_SITE_OTHER): Payer: Medicare Other | Admitting: Cardiothoracic Surgery

## 2019-06-01 ENCOUNTER — Ambulatory Visit (HOSPITAL_COMMUNITY)
Admission: RE | Admit: 2019-06-01 | Discharge: 2019-06-01 | Disposition: A | Discharge status: Home / Self Care | Payer: Medicare Other | Source: Ambulatory Visit | Attending: Cardiothoracic Surgery | Admitting: Cardiothoracic Surgery

## 2019-06-01 ENCOUNTER — Other Ambulatory Visit (HOSPITAL_COMMUNITY)
Admission: RE | Admit: 2019-06-01 | Discharge: 2019-06-01 | Disposition: A | Discharge status: Home / Self Care | Payer: Medicare Other | Source: Ambulatory Visit | Attending: Cardiothoracic Surgery | Admitting: Cardiothoracic Surgery

## 2019-06-01 ENCOUNTER — Other Ambulatory Visit: Payer: Self-pay | Admitting: *Deleted

## 2019-06-01 ENCOUNTER — Encounter: Payer: Self-pay | Admitting: Cardiothoracic Surgery

## 2019-06-01 VITALS — BP 149/80 | HR 72 | Temp 97.9°F | Resp 20 | Ht 67.5 in | Wt 182.0 lb

## 2019-06-01 DIAGNOSIS — I2510 Atherosclerotic heart disease of native coronary artery without angina pectoris: Secondary | ICD-10-CM

## 2019-06-01 DIAGNOSIS — E785 Hyperlipidemia, unspecified: Secondary | ICD-10-CM | POA: Diagnosis not present

## 2019-06-01 DIAGNOSIS — Z1159 Encounter for screening for other viral diseases: Secondary | ICD-10-CM | POA: Diagnosis not present

## 2019-06-01 DIAGNOSIS — I10 Essential (primary) hypertension: Secondary | ICD-10-CM | POA: Insufficient documentation

## 2019-06-01 DIAGNOSIS — Z951 Presence of aortocoronary bypass graft: Secondary | ICD-10-CM | POA: Diagnosis not present

## 2019-06-01 DIAGNOSIS — I208 Other forms of angina pectoris: Secondary | ICD-10-CM | POA: Diagnosis not present

## 2019-06-01 DIAGNOSIS — I712 Thoracic aortic aneurysm, without rupture, unspecified: Secondary | ICD-10-CM

## 2019-06-01 DIAGNOSIS — I4891 Unspecified atrial fibrillation: Secondary | ICD-10-CM | POA: Diagnosis not present

## 2019-06-01 NOTE — Progress Notes (Signed)
[facility] ([address]), [place] [facility] [place] [address]  ([place]) [place] [address] Phone: [phone] Fax: [phone]      Your procedure is scheduled on June 9th.  Report to [facility] Main Entrance "A" at 5:30 A.M., and check in at the Admitting office.  Call this number if you have problems the morning of surgery:  [phone]  Call [phone] if you have any questions prior to your surgery date Monday-Friday 8am-4pm    Remember:  Do not eat or drink after midnight.     Take these medicines the morning of surgery with A SIP OF WATER   Albuterol Inhaler - if needed (bring with you, day of surgery)  Amlodipine (Norvasc)  Atorvastatin (Lipitor)  Flonase - if needed  Metoprolol (Lopressor)  Protonix  Follow your surgeon's instructions on when to stop Eliquis.  If no instructions were given by your surgeon then you will need to call the office to get those instructions.    7 days prior to surgery STOP taking any Aspirin (unless otherwise instructed by your surgeon), Aleve, Naproxen, Ibuprofen, Motrin, Advil, Goody's, BC's, all herbal medications, fish oil, and all vitamins.    The Morning of Surgery  Do not wear jewelry.  Do not wear lotions, powders, or perfumes/colognes, or deodorant  Men may shave face and neck.  Do not bring valuables to the hospital.  [facility] is not responsible for any belongings or valuables.  If you are a smoker, DO NOT Smoke 24 hours prior to surgery IF you wear a CPAP at night please bring your mask, tubing, and machine the morning of surgery   Remember that you must have someone to transport you home after your surgery, and remain with you for 24 hours if you are discharged the same day.   Contacts, glasses, hearing aids, dentures or bridgework may not be worn into surgery.    Leave your suitcase in the car.  After surgery it may be brought to your room.  For patients admitted to the hospital,  discharge time will be determined by your treatment team.  Patients discharged the day of surgery will not be allowed to drive home.    Special instructions:   [facility]- Preparing For Surgery  Before surgery, you can play an important role. Because skin is not sterile, your skin needs to be as free of germs as possible. You can reduce the number of germs on your skin by washing with CHG (chlorahexidine gluconate) Soap before surgery.  CHG is an antiseptic cleaner which kills germs and bonds with the skin to continue killing germs even after washing.    Oral Hygiene is also important to reduce your risk of infection.  Remember - BRUSH YOUR TEETH THE MORNING OF SURGERY WITH YOUR REGULAR TOOTHPASTE  Please do not use if you have an allergy to CHG or antibacterial soaps. If your skin becomes reddened/irritated stop using the CHG.  Do not shave (including legs and underarms) for at least 48 hours prior to first CHG shower. It is OK to shave your face.  Please follow these instructions carefully.   1. Shower the NIGHT BEFORE SURGERY and the MORNING OF SURGERY with CHG Soap.   2. If you chose to wash your hair, wash your hair first as usual with your normal shampoo.  3. After you shampoo, rinse your hair and body thoroughly to remove the shampoo.  4. Use CHG as you would any other liquid  soap. You can apply CHG directly to the skin and wash gently with a scrungie or a clean washcloth.   5. Apply the CHG Soap to your body ONLY FROM THE NECK DOWN.  Do not use on open wounds or open sores. Avoid contact with your eyes, ears, mouth and genitals (private parts). Wash Face and genitals (private parts)  with your normal soap.   6. Wash thoroughly, paying special attention to the area where your surgery will be performed.  7. Thoroughly rinse your body with warm water from the neck down.  8. DO NOT shower/wash with your normal soap after using and rinsing off the CHG Soap.  9. Pat yourself dry  with a CLEAN TOWEL.  10. Wear CLEAN PAJAMAS to bed the night before surgery, wear comfortable clothes the morning of surgery  11. Place CLEAN SHEETS on your bed the night of your first shower and DO NOT SLEEP WITH PETS.    Day of Surgery:  Do not apply any deodorants/lotions.  Please wear clean clothes to the hospital/surgery center.   Remember to brush your teeth WITH YOUR REGULAR TOOTHPASTE.

## 2019-06-01 NOTE — Progress Notes (Signed)
Pre cabg has been completed.   Preliminary results in CV Proc.   Derrick [name] 06/01/2019 2:28 PM

## 2019-06-01 NOTE — Progress Notes (Signed)
PCP is [name], MD Referring Provider is [name], MD  Chief Complaint  Patient presents with  . Coronary Artery Disease    f/u after PFT's/ Chest CT  . Thoracic Aortic Aneurysm    HPI: Patient returns for further discussion of his left main and three-vessel CAD, symptomatic with decreased exercise tolerance and fatigue.  He has chronic paroxysmal atrial fibrillation on Multitak and Eliquis.  And Multitak.  He has clinical diagnosis of sarcoidosis.  For that reason he underwent CT scan of chest and PFTs which showed no significant anatomical problems with the lungs and very acceptable pulmonary function for sternotomy  The patient will be scheduled for CABG on June 9 at [facility].  He will have the COVID test and preoperative studies. He understands to stop taking the Eliquis now so that he can have a 4-day washout. He will take his beta-blocker on the morning of surgery.   Past Medical History:  Diagnosis Date  . Anemia   . BENIGN PROSTATIC HYPERTROPHY, WITH OBSTRUCTION 05/28/2010  . CAD (coronary artery disease)    a. s/p NSTEMI 06/01/11: DES to RCA;  b. cath 06/25/11:   dLM 50-60% (FFR 0.87), prox to mid LAD 40-50%, D1 50%, pCFX 50%, RCA stent ok, dPDA 80%, EF 55-60%.  His FFR was felt to be negative and therefore medical therapy was recommended ;  echo 6/12: EF 55-60%, mild AS   . Chronic back pain   . 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 Colon cancer ([place]) dx'd 2000   "left"  . COLONIC POLYPS, ADENOMATOUS, HX OF 05/28/2010  . Complication of anesthesia    "stopped breathing; related to my sleep apnea" & urinary retention   . COPD (chronic obstructive pulmonary disease) ([place])    "associated w/lung sarcoidosis"  . 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 DISH (diffuse idiopathic skeletal hyperostosis) 12/02/2015  . Diverticulitis   . GENERALIZED OSTEOARTHROSIS UNSPECIFIED SITE 05/28/2010  . GERD 05/28/2010  . Heart murmur   .  History of gout   . HYPERLIPIDEMIA 05/28/2010  . Hypertension   . IgM lambda paraproteinemia   . 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 Malignant neoplasm of descending colon ([place]) 05/28/2010  . MITRAL VALVE PROLAPSE 05/28/2010  . 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 NSTEMI (non-ST elevated myocardial infarction) ([place]) 06/03/11  . 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  . OSA on CPAP 05/28/2010  . PALPITATIONS, CHRONIC 05/28/2010  . Parotid gland pain 2010   infection  . Peripheral neuropathy    "tx'd w/targeted chemo" (05/12/2016)  . Pneumonia 2-3 times  . Presence of permanent cardiac pacemaker   . 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 Sarcoidosis    pulmonalis  . UNSPECIFIED INFLAMMATORY AND TOXIC NEUROPATHY 05/28/2010  . Vision loss   . VITAMIN D DEFICIENCY 05/28/2010    Past Surgical History:  Procedure Laterality Date  . APPENDECTOMY  1954  . CATARACT EXTRACTION W/ INTRAOCULAR LENS IMPLANT Left   . COLON SURGERY  2000   decending colon   . CORONARY ANGIOPLASTY WITH STENT PLACEMENT  06/03/11  . ELECTROPHYSIOLOGIC STUDY N/A 05/12/2016   Procedure: Cardioversion;  Surgeon: [name], MD;  Location: [place] [facility];  Service: Cardiovascular;  Laterality: N/A;  . EP IMPLANTABLE DEVICE N/A 05/12/2016   Procedure: Pacemaker Implant;  Surgeon: [name], MD;  Location: [place] [facility];  Service: Cardiovascular;  Laterality: N/A;  . EYE SURGERY    .  INGUINAL HERNIA REPAIR Left 02/21/2019   Procedure: LEFT INGUINAL HERNIA REPAIR WITH MESH;  Surgeon: [name], MD;  Location: [place];  Service: General;  Laterality: Left;  . INSERT / REPLACE / REMOVE PACEMAKER  05/12/2016  . INTRAVASCULAR PRESSURE WIRE/FFR STUDY N/A 05/15/2019   Procedure: INTRAVASCULAR PRESSURE WIRE/FFR STUDY;  Surgeon: [name], MD;  Location: [place] [facility];  Service: Cardiovascular;  Laterality: N/A;  . KNEE ARTHROSCOPY Right  2003  . LEFT HEART CATH AND CORONARY ANGIOGRAPHY N/A 05/15/2019   Procedure: LEFT HEART CATH AND CORONARY ANGIOGRAPHY;  Surgeon: [name], MD;  Location: [place] [facility];  Service: Cardiovascular;  Laterality: N/A;  . LEFT HEART CATHETERIZATION WITH CORONARY ANGIOGRAM N/A 03/31/2015   Procedure: LEFT HEART CATHETERIZATION WITH CORONARY ANGIOGRAM;  Surgeon: [name], MD;  Location: [facility] C[facility];  Service: Cardiovascular;  Laterality: N/A;  . LUNG SURGERY  2001   "open lung dissection"  . MOHS SURGERY Left ~ 2008   ear  . PROSTATE BIOPSY  2010  . TONSILLECTOMY  1950    Family History  Problem Relation Age of Onset  . Arthritis Mother        severe  . Coronary artery disease Mother   . Colon polyps Mother   . Heart disease Mother   . Coronary artery disease Brother   . Cancer Brother        choriocarcinoma  . Arrhythmia Brother 32       Afib/Tachycardia  . Heart attack Brother   . Congestive Heart Failure Brother     Social History Social History   Tobacco Use  . Smoking status: Never Smoker  . Smokeless tobacco: Never Used  Substance Use Topics  . Alcohol use: Yes    Comment: occosional  . Drug use: No    Current Outpatient Medications  Medication Sig Dispense Refill  . albuterol (PROVENTIL HFA;VENTOLIN HFA) 108 (90 Base) MCG/ACT inhaler Inhale 2 puffs into the lungs every 6 (six) hours as needed for wheezing or shortness of breath. 1 Inhaler 1  . amLODipine (NORVASC) 5 MG tablet Take 1 tablet (5 mg total) by mouth daily. 90 tablet 3  . apixaban (ELIQUIS) 5 MG TABS tablet Take 1 tablet (5 mg total) by mouth 2 (two) times daily. 180 tablet 1  . atorvastatin (LIPITOR) 20 MG tablet Take 1 tablet (20 mg total) by mouth daily at 6 PM. 90 tablet 3  . bimatoprost (LUMIGAN) 0.01 % SOLN Place 1 drop into both eyes at bedtime.    . Cholecalciferol 25 MCG (1000 UT) tablet Take 1,000 Units by mouth daily.     . dorzolamide-timolol (COSOPT) 22.3-6.8 MG/ML ophthalmic  solution Place 1 drop into both eyes 2 (two) times daily.     . finasteride (PROSCAR) 5 MG tablet Take 5 mg by mouth daily.     . fluticasone (FLONASE) 50 MCG/ACT nasal spray Place 2 sprays into both nostrils 2 (two) times daily.    . Fluticasone Propionate, Inhal, (FLOVENT DISKUS) 100 MCG/BLIST AEPB Inhale 1 Inhaler into the lungs 2 (two) times daily. (Patient taking differently: Inhale 1 Inhaler into the lungs 2 (two) times daily as needed (shortness of breath). ) 60 each 1  . metoprolol tartrate (LOPRESSOR) 50 MG tablet Take 1 tablet (50 mg total) by mouth 2 (two) times daily. 180 tablet 3  . MULTAQ 400 MG tablet TAKE 1 TABLET BY MOUTH TWICE DAILY WITH MEALS (Patient taking differently: Take 400 mg by mouth 2 (two) times a  day. ) 180 tablet 1  . Multiple Vitamin (MULTIVITAMIN WITH MINERALS) TABS tablet Take 1 tablet by mouth daily.    . pantoprazole (PROTONIX) 40 MG tablet TAKE 1 TABLET BY MOUTH ONCE DAILY (Patient taking differently: Take 40 mg by mouth daily. ) 90 tablet 3  . Polyethyl Glycol-Propyl Glycol (SYSTANE OP) Place 1 drop into both eyes daily as needed (dry eyes).    . tadalafil (CIALIS) 5 MG tablet Take 5 mg by mouth at bedtime.     . triamterene-hydrochlorothiazide (MAXZIDE-25) 37.5-25 MG tablet Take 1 tablet by mouth once daily 90 tablet 3   No current facility-administered medications for this visit.     No Known Allergies  Review of Systems   No change since initial consultation 10 days ago  BP (!) 149/80   Pulse 72   Temp 97.9 F (36.6 C) (Skin)   Resp 20   Ht 5' 7.5" (1.715 m)   Wt 182 lb (82.6 kg)   SpO2 98% Comment: RA  BMI 28.08 kg/m  Physical Exam     Physical Exam  General: Very nice 78 year old male no acute distress HEENT: Normocephalic pupils equal , dentition adequate Neck: Supple without JVD, adenopathy, or bruit Chest: Clear to auscultation, symmetrical breath sounds, no rhonchi, no tenderness             or deformity Cardiovascular: Regular  rate and rhythm, 2/6 murmur from aortic sclerosis,, no gallop, peripheral pulses             palpable in all extremities Abdomen:  Soft, nontender, no palpable mass or organomegaly Extremities: Warm, well-perfused, no clubbing cyanosis edema or tenderness,              no venous stasis changes of the legs Rectal/GU: Deferred Neuro: Grossly non--focal and symmetrical throughout Skin: Clean and dry without rash or ulceration   Diagnostic Tests: I reviewed the results of his CT scan of the chest and PFTs.  I reviewed his coronary angiograms which demonstrate a left main stenosis, proximal LAD stenosis, distal RCA stenosis with a patent RCA stent.  Plan bypass grafts using left IMA to LAD and vein graft to the OM, diagonal, and posterior descending.   Impression: Symptomatic left main three-vessel CAD with preserved LV function  Plan: CABG scheduled June 9 at [facility].  Benefits and risks discussed with patient and he agrees to proceed.  His blood pressure has been creeping up and he will go back on regular Norvasc dose 10 mg daily.   Len Childs, MD [facility] [phone]

## 2019-06-02 LAB — NOVEL CORONAVIRUS, NAA (HOSP ORDER, SEND-OUT TO REF LAB; TAT 18-24 HRS): SARS-CoV-2, NAA: NOT DETECTED

## 2019-06-04 ENCOUNTER — Encounter (HOSPITAL_COMMUNITY): Payer: Self-pay | Admitting: Certified Registered Nurse Anesthetist

## 2019-06-04 ENCOUNTER — Encounter (HOSPITAL_COMMUNITY)
Admission: RE | Admit: 2019-06-04 | Discharge: 2019-06-04 | Disposition: A | Discharge status: Home / Self Care | Payer: Medicare Other | Source: Ambulatory Visit | Attending: Cardiothoracic Surgery | Admitting: Cardiothoracic Surgery

## 2019-06-04 ENCOUNTER — Encounter (HOSPITAL_COMMUNITY): Payer: Self-pay

## 2019-06-04 ENCOUNTER — Other Ambulatory Visit: Payer: Self-pay

## 2019-06-04 ENCOUNTER — Ambulatory Visit (HOSPITAL_COMMUNITY)
Admission: RE | Admit: 2019-06-04 | Discharge: 2019-06-04 | Disposition: A | Discharge status: Home / Self Care | Payer: Medicare Other | Source: Ambulatory Visit | Attending: Cardiothoracic Surgery | Admitting: Cardiothoracic Surgery

## 2019-06-04 DIAGNOSIS — Z01818 Encounter for other preprocedural examination: Secondary | ICD-10-CM | POA: Diagnosis not present

## 2019-06-04 DIAGNOSIS — I2510 Atherosclerotic heart disease of native coronary artery without angina pectoris: Secondary | ICD-10-CM

## 2019-06-04 HISTORY — DX: Personal history of urinary calculi: Z87.442

## 2019-06-04 LAB — URINALYSIS, ROUTINE W REFLEX MICROSCOPIC
Bilirubin Urine: NEGATIVE
Glucose, UA: NEGATIVE mg/dL
Hgb urine dipstick: NEGATIVE
Ketones, ur: NEGATIVE mg/dL
Leukocytes,Ua: NEGATIVE
Nitrite: NEGATIVE
Protein, ur: NEGATIVE mg/dL
Specific Gravity, Urine: 1.015 (ref 1.005–1.030)
pH: 7 (ref 5.0–8.0)

## 2019-06-04 LAB — SURGICAL PCR SCREEN
MRSA, PCR: NEGATIVE
Staphylococcus aureus: NEGATIVE

## 2019-06-04 LAB — CBC
HCT: 41.5 % (ref 39.0–52.0)
Hemoglobin: 13.8 g/dL (ref 13.0–17.0)
MCH: 30.9 pg (ref 26.0–34.0)
MCHC: 33.3 g/dL (ref 30.0–36.0)
MCV: 93 fL (ref 80.0–100.0)
Platelets: 175 10*3/uL (ref 150–400)
RBC: 4.46 MIL/uL (ref 4.22–5.81)
RDW: 12.7 % (ref 11.5–15.5)
WBC: 9.6 10*3/uL (ref 4.0–10.5)
nRBC: 0 % (ref 0.0–0.2)

## 2019-06-04 LAB — HEMOGLOBIN A1C
Hgb A1c MFr Bld: 4.9 % (ref 4.8–5.6)
Mean Plasma Glucose: 93.93 mg/dL

## 2019-06-04 LAB — BLOOD GAS, ARTERIAL
Acid-Base Excess: 1.5 mmol/L (ref 0.0–2.0)
Bicarbonate: 25.6 mmol/L (ref 20.0–28.0)
Drawn by: 421801
O2 Saturation: 97.8 %
Patient temperature: 98.6
pCO2 arterial: 40.8 mmHg (ref 32.0–48.0)
pH, Arterial: 7.414 (ref 7.350–7.450)
pO2, Arterial: 101 mmHg (ref 83.0–108.0)

## 2019-06-04 LAB — TYPE AND SCREEN
ABO/RH(D): O POS
Antibody Screen: NEGATIVE

## 2019-06-04 LAB — PROTIME-INR
INR: 1.1 (ref 0.8–1.2)
Prothrombin Time: 13.9 seconds (ref 11.4–15.2)

## 2019-06-04 LAB — COMPREHENSIVE METABOLIC PANEL
ALT: 17 U/L (ref 0–44)
AST: 20 U/L (ref 15–41)
Albumin: 4.1 g/dL (ref 3.5–5.0)
Alkaline Phosphatase: 52 U/L (ref 38–126)
Anion gap: 10 (ref 5–15)
BUN: 17 mg/dL (ref 8–23)
CO2: 27 mmol/L (ref 22–32)
Calcium: 10.1 mg/dL (ref 8.9–10.3)
Chloride: 102 mmol/L (ref 98–111)
Creatinine, Ser: 0.97 mg/dL (ref 0.61–1.24)
GFR calc Af Amer: >60 mL/min (ref >60)
GFR calc non Af Amer: >60 mL/min (ref >60)
Glucose, Bld: 103 mg/dL — ABNORMAL HIGH (ref 70–99)
Potassium: 3.6 mmol/L (ref 3.5–5.1)
Sodium: 139 mmol/L (ref 135–145)
Total Bilirubin: 1 mg/dL (ref 0.3–1.2)
Total Protein: 7 g/dL (ref 6.5–8.1)

## 2019-06-04 LAB — APTT: aPTT: 29 seconds (ref 24–36)

## 2019-06-04 LAB — ABO/RH: ABO/RH(D): O POS

## 2019-06-04 IMAGING — CR CHEST - 2 VIEW
2 series · 2 of 2 positions shown · non-contrast
Comparison: [DATE]

CLINICAL DATA: Pre-admission testing prior to CABG

EXAM:
CHEST - 2 VIEW

[w chest pa]
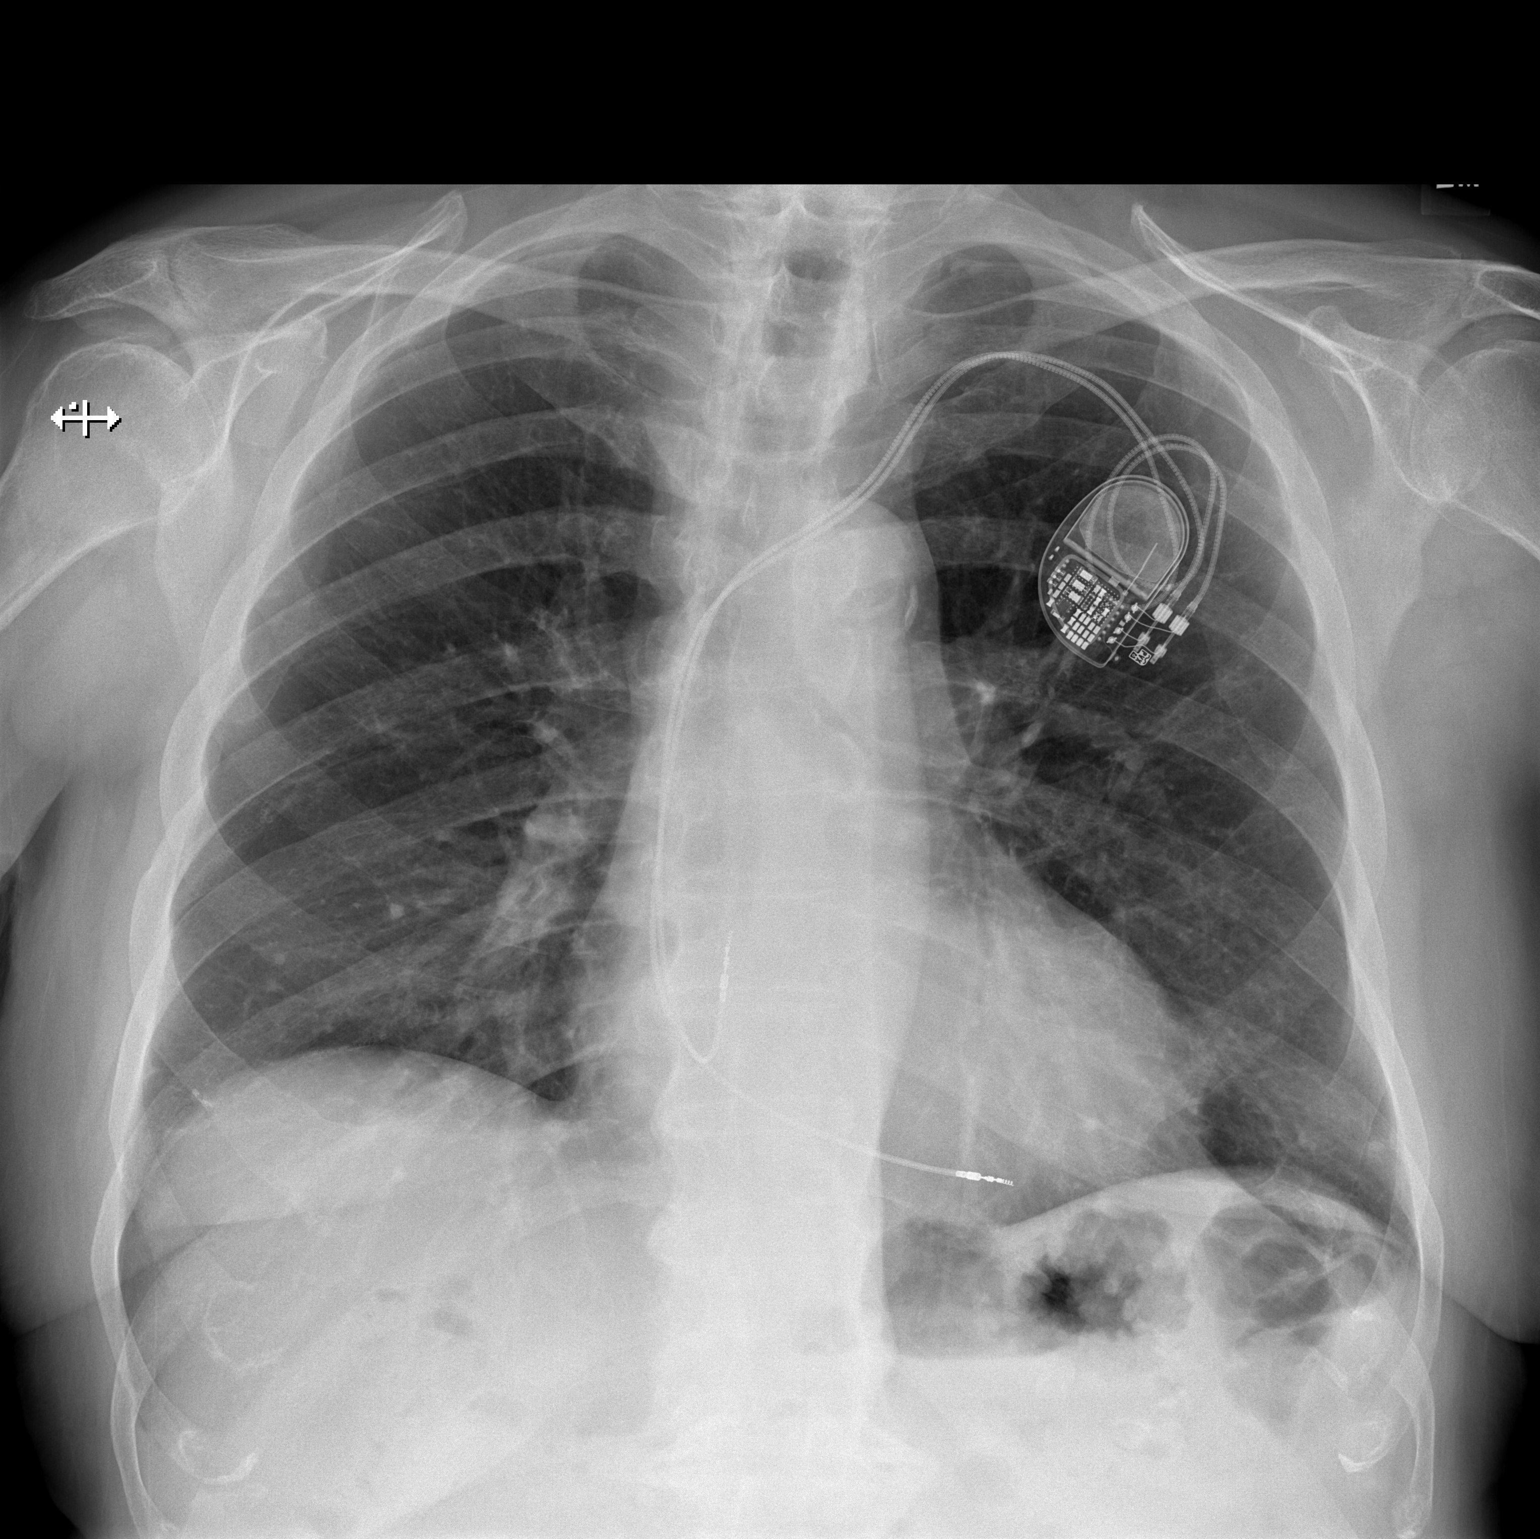

[w chest lat]
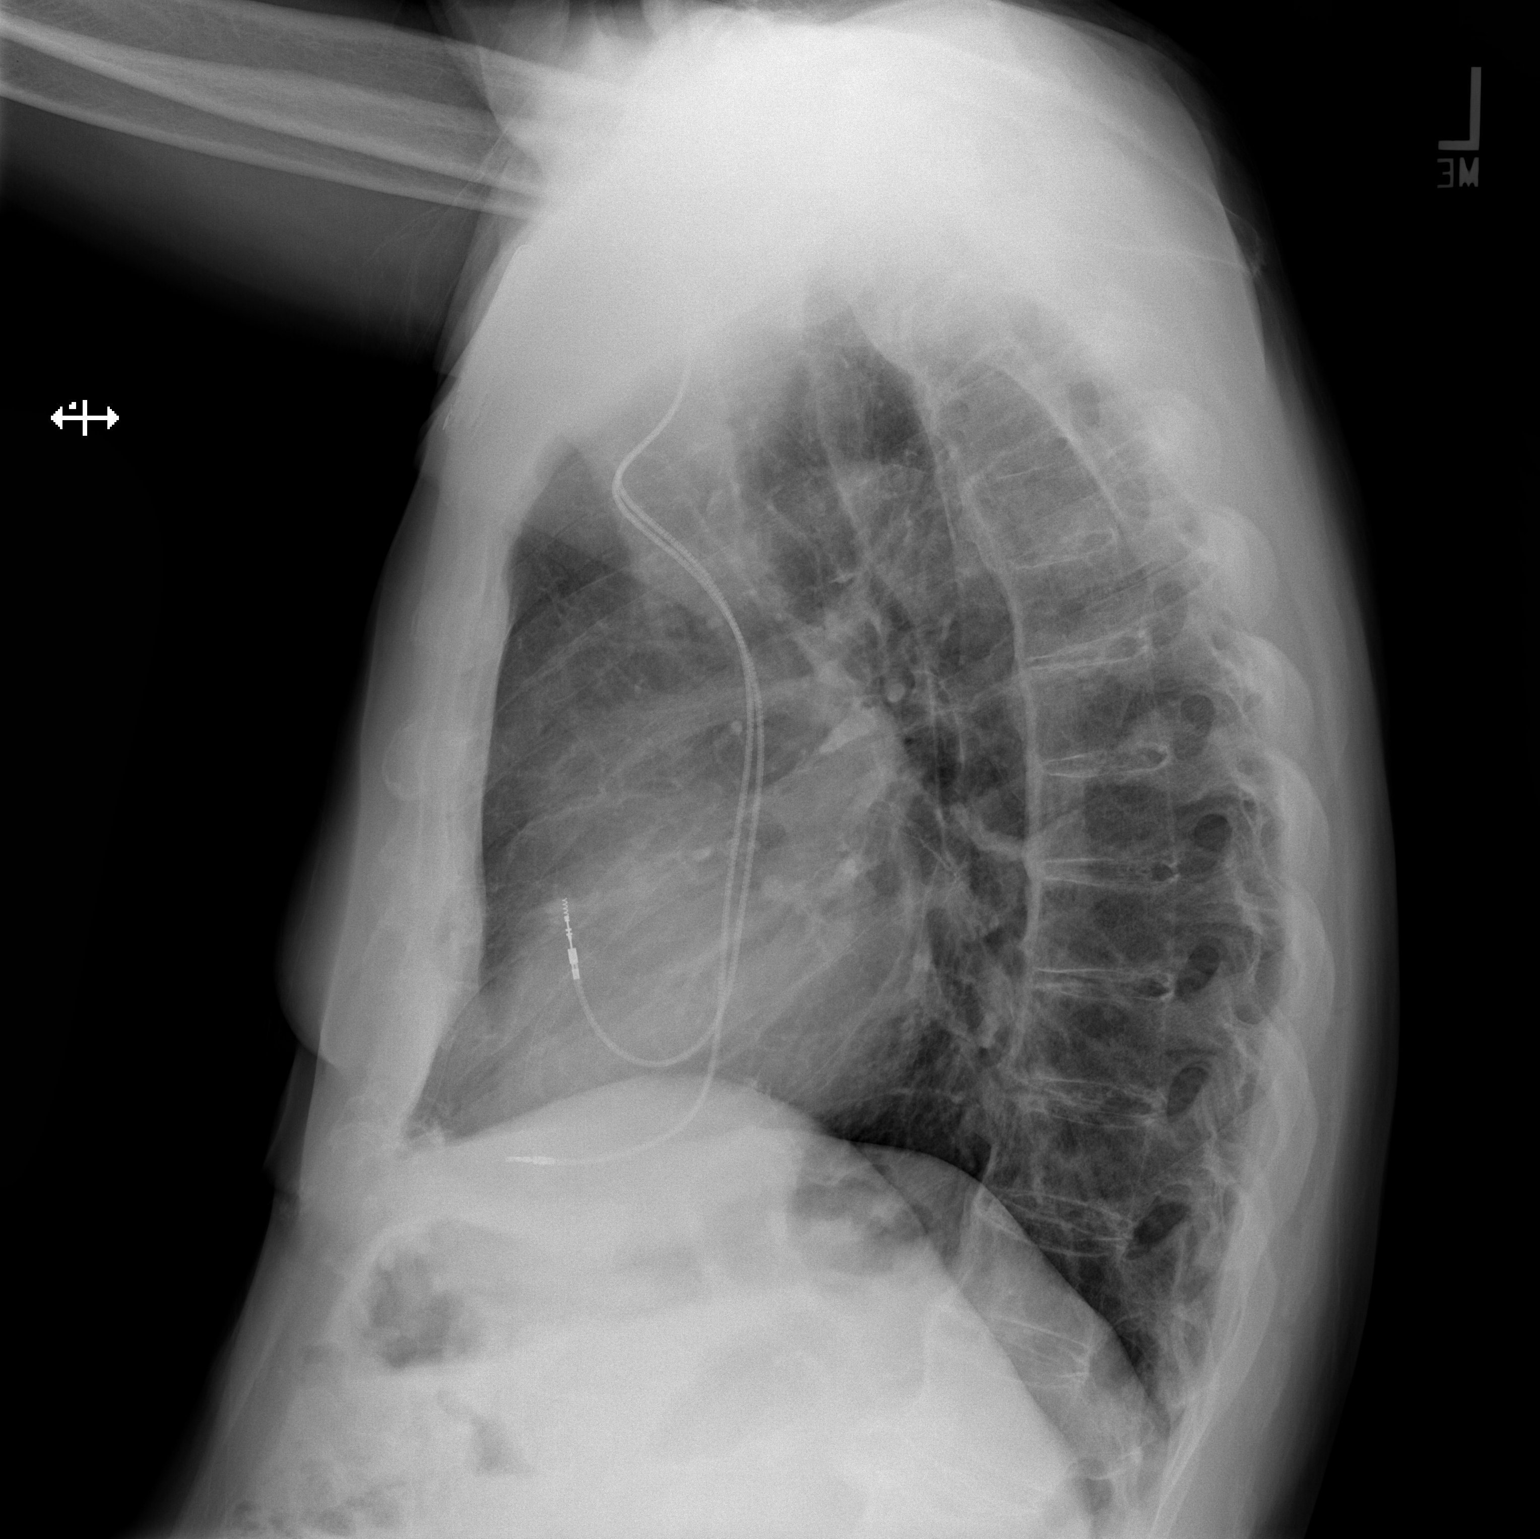

[2 of 2 positions shown; findings below may reference images not displayed]

FINDINGS: There is no focal parenchymal opacity. There is no pleural effusion
or pneumothorax. The heart and mediastinal contours are
unremarkable. There is a dual lead cardiac pacemaker present.

There is no acute osseous abnormality. There are syndesmophytes of
the thoracic spine as can be seen with ankylosing spondylitis.
IMPRESSION: No active cardiopulmonary disease.

## 2019-06-04 MED ORDER — SODIUM CHLORIDE 0.9 % IV SOLN
1.5000 g | INTRAVENOUS | Status: AC
  Administered 2019-06-05: 1.5 g via INTRAVENOUS
  Filled 2019-06-04: qty 1.5

## 2019-06-04 MED ORDER — SODIUM CHLORIDE 0.9 % IV SOLN
750.0000 mg | INTRAVENOUS | Status: DC
  Filled 2019-06-04: qty 750

## 2019-06-04 MED ORDER — INSULIN REGULAR(HUMAN) IN NACL 100-0.9 UT/100ML-% IV SOLN
INTRAVENOUS | Status: AC
  Administered 2019-06-05: .6 [IU]/h via INTRAVENOUS
  Filled 2019-06-04: qty 100

## 2019-06-04 MED ORDER — DOPAMINE-DEXTROSE 3.2-5 MG/ML-% IV SOLN
0.0000 ug/kg/min | INTRAVENOUS | Status: DC
  Filled 2019-06-04: qty 250

## 2019-06-04 MED ORDER — VANCOMYCIN HCL 10 G IV SOLR
1250.0000 mg | INTRAVENOUS | Status: AC
  Administered 2019-06-05: 1250 mg via INTRAVENOUS
  Filled 2019-06-04: qty 1250

## 2019-06-04 MED ORDER — NITROGLYCERIN IN D5W 200-5 MCG/ML-% IV SOLN
2.0000 ug/min | INTRAVENOUS | Status: DC
  Filled 2019-06-04: qty 250

## 2019-06-04 MED ORDER — TRANEXAMIC ACID (OHS) BOLUS VIA INFUSION
15.0000 mg/kg | INTRAVENOUS | Status: AC
  Administered 2019-06-05: 1243.5 mg via INTRAVENOUS
  Filled 2019-06-04: qty 1244

## 2019-06-04 MED ORDER — EPINEPHRINE PF 1 MG/ML IJ SOLN
0.0000 ug/min | INTRAVENOUS | Status: DC
  Filled 2019-06-04: qty 4

## 2019-06-04 MED ORDER — NOREPINEPHRINE 4 MG/250ML-% IV SOLN
0.0000 ug/min | INTRAVENOUS | Status: DC
  Filled 2019-06-04: qty 250

## 2019-06-04 MED ORDER — DEXMEDETOMIDINE HCL IN NACL 400 MCG/100ML IV SOLN
0.1000 ug/kg/h | INTRAVENOUS | Status: AC
  Administered 2019-06-05: .3 ug/kg/h via INTRAVENOUS
  Filled 2019-06-04: qty 100

## 2019-06-04 MED ORDER — SODIUM CHLORIDE 0.9 % IV SOLN
INTRAVENOUS | Status: DC
  Filled 2019-06-04: qty 30

## 2019-06-04 MED ORDER — MILRINONE LACTATE IN DEXTROSE 20-5 MG/100ML-% IV SOLN
0.3000 ug/kg/min | INTRAVENOUS | Status: DC
  Filled 2019-06-04: qty 100

## 2019-06-04 MED ORDER — PHENYLEPHRINE HCL-NACL 20-0.9 MG/250ML-% IV SOLN
30.0000 ug/min | INTRAVENOUS | Status: AC
  Administered 2019-06-05: 20 ug/min via INTRAVENOUS
  Filled 2019-06-04: qty 250

## 2019-06-04 MED ORDER — POTASSIUM CHLORIDE 2 MEQ/ML IV SOLN
80.0000 meq | INTRAVENOUS | Status: DC
  Filled 2019-06-04: qty 40

## 2019-06-04 MED ORDER — MAGNESIUM SULFATE 50 % IJ SOLN
40.0000 meq | INTRAMUSCULAR | Status: DC
  Filled 2019-06-04: qty 9.85

## 2019-06-04 MED ORDER — TRANEXAMIC ACID 1000 MG/10ML IV SOLN
1.5000 mg/kg/h | INTRAVENOUS | Status: AC
  Administered 2019-06-05: 1.5 mg/kg/h via INTRAVENOUS
  Filled 2019-06-04: qty 25

## 2019-06-04 MED ORDER — TRANEXAMIC ACID (OHS) PUMP PRIME SOLUTION
2.0000 mg/kg | INTRAVENOUS | Status: DC
  Filled 2019-06-04: qty 1.66

## 2019-06-04 MED ORDER — PLASMA-LYTE 148 IV SOLN
INTRAVENOUS | Status: AC
  Administered 2019-06-05: 500 mL
  Filled 2019-06-04: qty 2.5

## 2019-06-04 NOTE — Progress Notes (Signed)
PCP - Dr. [name]  Cardiologist - Dr. Burt Knack  Chest x-ray - 06/04/2019  EKG - 05/15/2019 (E)  Stress Test - 05/29/18 (E)  ECHO - 05/22/2019 (E)  Cardiac Cath - 05/15/2019 (E)  AICD-na PM-Yes- [employer]- form faxed to Dr. Jennelle Human  Sleep Study - Yes- Positive CPAP - Yes- will bring [place]- 06/04/2019: CBC, CMP, ABG, T/S, UA, PCR 06/05/2019: PT/PTT 06/01/2019: COVID- NEG  ASA-Denies Eliquis- LD- 6/5  ERAS- No  HA1C- 06/04/2019  Anesthesia- Yes- cardiac history  Pt denies having chest pain, sob, or fever at this time. All instructions explained to the pt, with a verbal understanding of the material. Pt agrees to go over the instructions while at home for a better understanding. The opportunity to ask questions was provided.   Coronavirus Screening  Have you experienced the following symptoms:  Cough yes/no: No Fever (>100.31F)  yes/no: No Runny nose yes/no: No Sore throat yes/no: No Difficulty breathing/shortness of breath  yes/no: No  Have you or a family member traveled in the last 14 days and where? yes/no: No   If the patient indicates "YES" to the above questions, their PAT will be rescheduled to limit the exposure to others and, the surgeon will be notified. THE PATIENT WILL NEED TO BE ASYMPTOMATIC FOR 14 DAYS.   If the patient is not experiencing any of these symptoms, the PAT nurse will instruct them to NOT bring anyone with them to their appointment since they may have these symptoms or traveled as well.   Please remind your patients and families that hospital visitation restrictions are in effect and the importance of the restrictions.

## 2019-06-05 ENCOUNTER — Inpatient Hospital Stay (HOSPITAL_COMMUNITY): Payer: Medicare Other | Admitting: Physician Assistant

## 2019-06-05 ENCOUNTER — Other Ambulatory Visit: Payer: Self-pay

## 2019-06-05 ENCOUNTER — Inpatient Hospital Stay (HOSPITAL_COMMUNITY)
Admission: RE | Admit: 2019-06-05 | Discharge: 2019-06-11 | DRG: 236 | Disposition: A | Discharge status: Home / Self Care | Payer: Medicare Other | Attending: Cardiothoracic Surgery | Admitting: Cardiothoracic Surgery

## 2019-06-05 ENCOUNTER — Encounter (HOSPITAL_COMMUNITY): Payer: Self-pay

## 2019-06-05 ENCOUNTER — Inpatient Hospital Stay (HOSPITAL_COMMUNITY): Payer: Medicare Other | Admitting: Certified Registered Nurse Anesthetist

## 2019-06-05 ENCOUNTER — Inpatient Hospital Stay (HOSPITAL_COMMUNITY): Payer: Medicare Other

## 2019-06-05 ENCOUNTER — Inpatient Hospital Stay (HOSPITAL_COMMUNITY)
Admission: RE | Disposition: A | Discharge status: Home / Self Care | Payer: Self-pay | Source: Home / Self Care | Attending: Cardiothoracic Surgery

## 2019-06-05 DIAGNOSIS — J90 Pleural effusion, not elsewhere classified: Secondary | ICD-10-CM | POA: Diagnosis not present

## 2019-06-05 DIAGNOSIS — Z9221 Personal history of antineoplastic chemotherapy: Secondary | ICD-10-CM

## 2019-06-05 DIAGNOSIS — J449 Chronic obstructive pulmonary disease, unspecified: Secondary | ICD-10-CM | POA: Diagnosis not present

## 2019-06-05 DIAGNOSIS — I959 Hypotension, unspecified: Secondary | ICD-10-CM | POA: Diagnosis not present

## 2019-06-05 DIAGNOSIS — Z955 Presence of coronary angioplasty implant and graft: Secondary | ICD-10-CM

## 2019-06-05 DIAGNOSIS — Z9049 Acquired absence of other specified parts of digestive tract: Secondary | ICD-10-CM

## 2019-06-05 DIAGNOSIS — G6181 Chronic inflammatory demyelinating polyneuritis: Secondary | ICD-10-CM | POA: Diagnosis present

## 2019-06-05 DIAGNOSIS — K219 Gastro-esophageal reflux disease without esophagitis: Secondary | ICD-10-CM | POA: Diagnosis present

## 2019-06-05 DIAGNOSIS — I480 Paroxysmal atrial fibrillation: Secondary | ICD-10-CM | POA: Diagnosis present

## 2019-06-05 DIAGNOSIS — G8929 Other chronic pain: Secondary | ICD-10-CM | POA: Diagnosis present

## 2019-06-05 DIAGNOSIS — D62 Acute posthemorrhagic anemia: Secondary | ICD-10-CM | POA: Diagnosis not present

## 2019-06-05 DIAGNOSIS — I081 Rheumatic disorders of both mitral and tricuspid valves: Secondary | ICD-10-CM | POA: Diagnosis not present

## 2019-06-05 DIAGNOSIS — D860 Sarcoidosis of lung: Secondary | ICD-10-CM | POA: Diagnosis present

## 2019-06-05 DIAGNOSIS — Z951 Presence of aortocoronary bypass graft: Secondary | ICD-10-CM | POA: Diagnosis not present

## 2019-06-05 DIAGNOSIS — M109 Gout, unspecified: Secondary | ICD-10-CM | POA: Diagnosis present

## 2019-06-05 DIAGNOSIS — G629 Polyneuropathy, unspecified: Secondary | ICD-10-CM | POA: Diagnosis present

## 2019-06-05 DIAGNOSIS — N400 Enlarged prostate without lower urinary tract symptoms: Secondary | ICD-10-CM | POA: Diagnosis not present

## 2019-06-05 DIAGNOSIS — D696 Thrombocytopenia, unspecified: Secondary | ICD-10-CM | POA: Diagnosis not present

## 2019-06-05 DIAGNOSIS — Z8249 Family history of ischemic heart disease and other diseases of the circulatory system: Secondary | ICD-10-CM

## 2019-06-05 DIAGNOSIS — I25118 Atherosclerotic heart disease of native coronary artery with other forms of angina pectoris: Secondary | ICD-10-CM | POA: Diagnosis not present

## 2019-06-05 DIAGNOSIS — J9811 Atelectasis: Secondary | ICD-10-CM | POA: Diagnosis not present

## 2019-06-05 DIAGNOSIS — G4733 Obstructive sleep apnea (adult) (pediatric): Secondary | ICD-10-CM | POA: Diagnosis present

## 2019-06-05 DIAGNOSIS — E785 Hyperlipidemia, unspecified: Secondary | ICD-10-CM | POA: Diagnosis not present

## 2019-06-05 DIAGNOSIS — Z8719 Personal history of other diseases of the digestive system: Secondary | ICD-10-CM

## 2019-06-05 DIAGNOSIS — E8770 Fluid overload, unspecified: Secondary | ICD-10-CM | POA: Diagnosis not present

## 2019-06-05 DIAGNOSIS — Z85038 Personal history of other malignant neoplasm of large intestine: Secondary | ICD-10-CM

## 2019-06-05 DIAGNOSIS — Z20828 Contact with and (suspected) exposure to other viral communicable diseases: Secondary | ICD-10-CM | POA: Diagnosis not present

## 2019-06-05 DIAGNOSIS — J45909 Unspecified asthma, uncomplicated: Secondary | ICD-10-CM | POA: Diagnosis not present

## 2019-06-05 DIAGNOSIS — Z09 Encounter for follow-up examination after completed treatment for conditions other than malignant neoplasm: Secondary | ICD-10-CM

## 2019-06-05 DIAGNOSIS — D472 Monoclonal gammopathy: Secondary | ICD-10-CM | POA: Diagnosis not present

## 2019-06-05 DIAGNOSIS — I2510 Atherosclerotic heart disease of native coronary artery without angina pectoris: Secondary | ICD-10-CM

## 2019-06-05 DIAGNOSIS — Z95810 Presence of automatic (implantable) cardiac defibrillator: Secondary | ICD-10-CM

## 2019-06-05 DIAGNOSIS — H4010X0 Unspecified open-angle glaucoma, stage unspecified: Secondary | ICD-10-CM | POA: Diagnosis present

## 2019-06-05 DIAGNOSIS — Z7901 Long term (current) use of anticoagulants: Secondary | ICD-10-CM

## 2019-06-05 DIAGNOSIS — I10 Essential (primary) hypertension: Secondary | ICD-10-CM | POA: Diagnosis not present

## 2019-06-05 DIAGNOSIS — I252 Old myocardial infarction: Secondary | ICD-10-CM | POA: Diagnosis not present

## 2019-06-05 HISTORY — PX: TEE WITHOUT CARDIOVERSION: SHX5443

## 2019-06-05 HISTORY — PX: CORONARY ARTERY BYPASS GRAFT: SHX141

## 2019-06-05 LAB — POCT I-STAT 7, (LYTES, BLD GAS, ICA,H+H)
Acid-Base Excess: 2 mmol/L (ref 0.0–2.0)
Acid-base deficit: 2 mmol/L (ref 0.0–2.0)
Acid-base deficit: 3 mmol/L — ABNORMAL HIGH (ref 0.0–2.0)
Acid-base deficit: 4 mmol/L — ABNORMAL HIGH (ref 0.0–2.0)
Bicarbonate: 26.8 mmol/L (ref 20.0–28.0)
Bicarbonate: 25 mmol/L (ref 20.0–28.0)
Bicarbonate: 22.9 mmol/L (ref 20.0–28.0)
Bicarbonate: 23.3 mmol/L (ref 20.0–28.0)
Bicarbonate: 22.8 mmol/L (ref 20.0–28.0)
Calcium, Ion: 1.14 mmol/L — ABNORMAL LOW (ref 1.15–1.40)
Calcium, Ion: 1.25 mmol/L (ref 1.15–1.40)
Calcium, Ion: 1.21 mmol/L (ref 1.15–1.40)
Calcium, Ion: 1.27 mmol/L (ref 1.15–1.40)
Calcium, Ion: 1.3 mmol/L (ref 1.15–1.40)
HCT: 27 % — ABNORMAL LOW (ref 39.0–52.0)
HCT: 31 % — ABNORMAL LOW (ref 39.0–52.0)
HCT: 34 % — ABNORMAL LOW (ref 39.0–52.0)
HCT: 33 % — ABNORMAL LOW (ref 39.0–52.0)
HCT: 32 % — ABNORMAL LOW (ref 39.0–52.0)
Hemoglobin: 9.2 g/dL — ABNORMAL LOW (ref 13.0–17.0)
Hemoglobin: 10.5 g/dL — ABNORMAL LOW (ref 13.0–17.0)
Hemoglobin: 11.6 g/dL — ABNORMAL LOW (ref 13.0–17.0)
Hemoglobin: 11.2 g/dL — ABNORMAL LOW (ref 13.0–17.0)
Hemoglobin: 10.9 g/dL — ABNORMAL LOW (ref 13.0–17.0)
O2 Saturation: 100 %
O2 Saturation: 100 %
O2 Saturation: 100 %
O2 Saturation: 99 %
O2 Saturation: 91 %
Patient temperature: 36.3
Patient temperature: 36.7
Patient temperature: 37.1
Potassium: 3.5 mmol/L (ref 3.5–5.1)
Potassium: 4 mmol/L (ref 3.5–5.1)
Potassium: 3.4 mmol/L — ABNORMAL LOW (ref 3.5–5.1)
Potassium: 4.2 mmol/L (ref 3.5–5.1)
Potassium: 4.1 mmol/L (ref 3.5–5.1)
Sodium: 140 mmol/L (ref 135–145)
Sodium: 138 mmol/L (ref 135–145)
Sodium: 139 mmol/L (ref 135–145)
Sodium: 138 mmol/L (ref 135–145)
Sodium: 139 mmol/L (ref 135–145)
TCO2: 28 mmol/L (ref 22–32)
TCO2: 26 mmol/L (ref 22–32)
TCO2: 24 mmol/L (ref 22–32)
TCO2: 25 mmol/L (ref 22–32)
TCO2: 24 mmol/L (ref 22–32)
pCO2 arterial: 39.2 mmHg (ref 32.0–48.0)
pCO2 arterial: 40.4 mmHg (ref 32.0–48.0)
pCO2 arterial: 36.7 mmHg (ref 32.0–48.0)
pCO2 arterial: 43.7 mmHg (ref 32.0–48.0)
pCO2 arterial: 48.9 mmHg — ABNORMAL HIGH (ref 32.0–48.0)
pH, Arterial: 7.443 (ref 7.350–7.450)
pH, Arterial: 7.4 (ref 7.350–7.450)
pH, Arterial: 7.401 (ref 7.350–7.450)
pH, Arterial: 7.334 — ABNORMAL LOW (ref 7.350–7.450)
pH, Arterial: 7.277 — ABNORMAL LOW (ref 7.350–7.450)
pO2, Arterial: 325 mmHg — ABNORMAL HIGH (ref 83.0–108.0)
pO2, Arterial: 279 mmHg — ABNORMAL HIGH (ref 83.0–108.0)
pO2, Arterial: 194 mmHg — ABNORMAL HIGH (ref 83.0–108.0)
pO2, Arterial: 156 mmHg — ABNORMAL HIGH (ref 83.0–108.0)
pO2, Arterial: 71 mmHg — ABNORMAL LOW (ref 83.0–108.0)

## 2019-06-05 LAB — POCT I-STAT 4, (NA,K, GLUC, HGB,HCT)
Glucose, Bld: 92 mg/dL (ref 70–99)
Glucose, Bld: 94 mg/dL (ref 70–99)
Glucose, Bld: 121 mg/dL — ABNORMAL HIGH (ref 70–99)
Glucose, Bld: 125 mg/dL — ABNORMAL HIGH (ref 70–99)
Glucose, Bld: 105 mg/dL — ABNORMAL HIGH (ref 70–99)
Glucose, Bld: 105 mg/dL — ABNORMAL HIGH (ref 70–99)
HCT: 34 % — ABNORMAL LOW (ref 39.0–52.0)
HCT: 31 % — ABNORMAL LOW (ref 39.0–52.0)
HCT: 28 % — ABNORMAL LOW (ref 39.0–52.0)
HCT: 30 % — ABNORMAL LOW (ref 39.0–52.0)
HCT: 30 % — ABNORMAL LOW (ref 39.0–52.0)
HCT: 34 % — ABNORMAL LOW (ref 39.0–52.0)
Hemoglobin: 11.6 g/dL — ABNORMAL LOW (ref 13.0–17.0)
Hemoglobin: 10.5 g/dL — ABNORMAL LOW (ref 13.0–17.0)
Hemoglobin: 10.2 g/dL — ABNORMAL LOW (ref 13.0–17.0)
Hemoglobin: 9.5 g/dL — ABNORMAL LOW (ref 13.0–17.0)
Hemoglobin: 10.2 g/dL — ABNORMAL LOW (ref 13.0–17.0)
Hemoglobin: 11.6 g/dL — ABNORMAL LOW (ref 13.0–17.0)
Potassium: 3.5 mmol/L (ref 3.5–5.1)
Potassium: 3.4 mmol/L — ABNORMAL LOW (ref 3.5–5.1)
Potassium: 3.8 mmol/L (ref 3.5–5.1)
Potassium: 4 mmol/L (ref 3.5–5.1)
Potassium: 3.9 mmol/L (ref 3.5–5.1)
Potassium: 3.4 mmol/L — ABNORMAL LOW (ref 3.5–5.1)
Sodium: 140 mmol/L (ref 135–145)
Sodium: 138 mmol/L (ref 135–145)
Sodium: 135 mmol/L (ref 135–145)
Sodium: 138 mmol/L (ref 135–145)
Sodium: 138 mmol/L (ref 135–145)
Sodium: 139 mmol/L (ref 135–145)

## 2019-06-05 LAB — PROTIME-INR
INR: 1.5 — ABNORMAL HIGH (ref 0.8–1.2)
Prothrombin Time: 17.8 seconds — ABNORMAL HIGH (ref 11.4–15.2)

## 2019-06-05 LAB — POCT I-STAT, CHEM 8
BUN: 9 mg/dL (ref 8–23)
Calcium, Ion: 1.26 mmol/L (ref 1.15–1.40)
Chloride: 103 mmol/L (ref 98–111)
Creatinine, Ser: 0.6 mg/dL — ABNORMAL LOW (ref 0.61–1.24)
Glucose, Bld: 107 mg/dL — ABNORMAL HIGH (ref 70–99)
HCT: 32 % — ABNORMAL LOW (ref 39.0–52.0)
Hemoglobin: 10.9 g/dL — ABNORMAL LOW (ref 13.0–17.0)
Potassium: 3.8 mmol/L (ref 3.5–5.1)
Sodium: 139 mmol/L (ref 135–145)
TCO2: 23 mmol/L (ref 22–32)

## 2019-06-05 LAB — MAGNESIUM: Magnesium: 2.7 mg/dL — ABNORMAL HIGH (ref 1.7–2.4)

## 2019-06-05 LAB — CBC
HCT: 35.9 % — ABNORMAL LOW (ref 39.0–52.0)
HCT: 33 % — ABNORMAL LOW (ref 39.0–52.0)
Hemoglobin: 12.2 g/dL — ABNORMAL LOW (ref 13.0–17.0)
Hemoglobin: 11.3 g/dL — ABNORMAL LOW (ref 13.0–17.0)
MCH: 30.9 pg (ref 26.0–34.0)
MCH: 31.4 pg (ref 26.0–34.0)
MCHC: 34 g/dL (ref 30.0–36.0)
MCHC: 34.2 g/dL (ref 30.0–36.0)
MCV: 90.9 fL (ref 80.0–100.0)
MCV: 91.7 fL (ref 80.0–100.0)
Platelets: 135 10*3/uL — ABNORMAL LOW (ref 150–400)
Platelets: 119 10*3/uL — ABNORMAL LOW (ref 150–400)
RBC: 3.95 MIL/uL — ABNORMAL LOW (ref 4.22–5.81)
RBC: 3.6 MIL/uL — ABNORMAL LOW (ref 4.22–5.81)
RDW: 12.5 % (ref 11.5–15.5)
RDW: 12.7 % (ref 11.5–15.5)
WBC: 13.3 10*3/uL — ABNORMAL HIGH (ref 4.0–10.5)
WBC: 9.8 10*3/uL (ref 4.0–10.5)
nRBC: 0 % (ref 0.0–0.2)
nRBC: 0 % (ref 0.0–0.2)

## 2019-06-05 LAB — HEMOGLOBIN AND HEMATOCRIT, BLOOD
HCT: 30.1 % — ABNORMAL LOW (ref 39.0–52.0)
Hemoglobin: 10.3 g/dL — ABNORMAL LOW (ref 13.0–17.0)

## 2019-06-05 LAB — APTT: aPTT: 33 seconds (ref 24–36)

## 2019-06-05 LAB — CREATININE, SERUM
Creatinine, Ser: 0.8 mg/dL (ref 0.61–1.24)
GFR calc Af Amer: >60 mL/min (ref >60)
GFR calc non Af Amer: >60 mL/min (ref >60)

## 2019-06-05 LAB — GLUCOSE, CAPILLARY
Glucose-Capillary: 102 mg/dL — ABNORMAL HIGH (ref 70–99)
Glucose-Capillary: 109 mg/dL — ABNORMAL HIGH (ref 70–99)
Glucose-Capillary: 91 mg/dL (ref 70–99)
Glucose-Capillary: 104 mg/dL — ABNORMAL HIGH (ref 70–99)

## 2019-06-05 LAB — PLATELET COUNT: Platelets: 142 10*3/uL — ABNORMAL LOW (ref 150–400)

## 2019-06-05 SURGERY — CORONARY ARTERY BYPASS GRAFTING (CABG)
Anesthesia: General | Site: Chest

## 2019-06-05 MED ORDER — PHENYLEPHRINE HCL-NACL 20-0.9 MG/250ML-% IV SOLN
0.0000 ug/min | INTRAVENOUS | Status: DC
  Administered 2019-06-06 (×2): 40 ug/min via INTRAVENOUS
  Filled 2019-06-05 (×2): qty 250

## 2019-06-05 MED ORDER — LACTATED RINGERS IV SOLN
INTRAVENOUS | Status: DC
  Administered 2019-06-05: 22:00:00 via INTRAVENOUS

## 2019-06-05 MED ORDER — SODIUM BICARBONATE 8.4 % IV SOLN
50.0000 meq | Freq: Once | INTRAVENOUS | Status: AC
  Administered 2019-06-05: 50 meq via INTRAVENOUS

## 2019-06-05 MED ORDER — METOPROLOL TARTRATE 25 MG/10 ML ORAL SUSPENSION: 12.5000 mg | Freq: Two times a day (BID) | ORAL | Status: DC

## 2019-06-05 MED ORDER — DEXMEDETOMIDINE HCL IN NACL 200 MCG/50ML IV SOLN: 0.0000 ug/kg/h | INTRAVENOUS | Status: DC

## 2019-06-05 MED ORDER — SODIUM CHLORIDE 0.9 % IV SOLN
1.5000 g | Freq: Two times a day (BID) | INTRAVENOUS | Status: AC
  Administered 2019-06-05 – 2019-06-07 (×4): 1.5 g via INTRAVENOUS
  Filled 2019-06-05 (×4): qty 1.5

## 2019-06-05 MED ORDER — SODIUM CHLORIDE 0.9% FLUSH
3.0000 mL | Freq: Two times a day (BID) | INTRAVENOUS | Status: DC
  Administered 2019-06-06 – 2019-06-11 (×10): 3 mL via INTRAVENOUS

## 2019-06-05 MED ORDER — HEMOSTATIC AGENTS (NO CHARGE) OPTIME
TOPICAL | Status: DC | PRN
  Administered 2019-06-05: 1 via TOPICAL

## 2019-06-05 MED ORDER — MIDAZOLAM HCL 5 MG/5ML IJ SOLN
INTRAMUSCULAR | Status: DC | PRN
  Administered 2019-06-05 (×5): 2 mg via INTRAVENOUS

## 2019-06-05 MED ORDER — CHLORHEXIDINE GLUCONATE 0.12 % MT SOLN: 15.0000 mL | OROMUCOSAL | Status: AC

## 2019-06-05 MED ORDER — PROTAMINE SULFATE 10 MG/ML IV SOLN
INTRAVENOUS | Status: AC
  Filled 2019-06-05: qty 25

## 2019-06-05 MED ORDER — ACETAMINOPHEN 160 MG/5ML PO SOLN: 650.0000 mg | Freq: Once | ORAL | Status: AC

## 2019-06-05 MED ORDER — SODIUM CHLORIDE 0.9 % IV SOLN
INTRAVENOUS | Status: DC
  Administered 2019-06-05: 14:00:00 via INTRAVENOUS

## 2019-06-05 MED ORDER — LACTATED RINGERS IV SOLN
INTRAVENOUS | Status: DC | PRN
  Administered 2019-06-05: 07:00:00 via INTRAVENOUS

## 2019-06-05 MED ORDER — SODIUM CHLORIDE 0.9 % IV SOLN
INTRAVENOUS | Status: DC | PRN
  Administered 2019-06-05: 750 mg via INTRAVENOUS

## 2019-06-05 MED ORDER — VANCOMYCIN HCL IN DEXTROSE 1-5 GM/200ML-% IV SOLN
1000.0000 mg | Freq: Once | INTRAVENOUS | Status: AC
  Administered 2019-06-05: 1000 mg via INTRAVENOUS
  Filled 2019-06-05: qty 200

## 2019-06-05 MED ORDER — DORZOLAMIDE HCL-TIMOLOL MAL 2-0.5 % OP SOLN
1.0000 [drp] | Freq: Two times a day (BID) | OPHTHALMIC | Status: DC
  Administered 2019-06-05 – 2019-06-11 (×12): 1 [drp] via OPHTHALMIC
  Filled 2019-06-05: qty 10

## 2019-06-05 MED ORDER — ACETAMINOPHEN 500 MG PO TABS
1000.0000 mg | ORAL_TABLET | Freq: Four times a day (QID) | ORAL | Status: AC
  Administered 2019-06-06 – 2019-06-10 (×17): 1000 mg via ORAL
  Filled 2019-06-05 (×19): qty 2

## 2019-06-05 MED ORDER — MAGNESIUM SULFATE 4 GM/100ML IV SOLN
4.0000 g | Freq: Once | INTRAVENOUS | Status: AC
  Administered 2019-06-05: 4 g via INTRAVENOUS
  Filled 2019-06-05: qty 100

## 2019-06-05 MED ORDER — ORAL CARE MOUTH RINSE
15.0000 mL | Freq: Two times a day (BID) | OROMUCOSAL | Status: DC
  Administered 2019-06-06 – 2019-06-07 (×2): 15 mL via OROMUCOSAL

## 2019-06-05 MED ORDER — DOBUTAMINE IN D5W 4-5 MG/ML-% IV SOLN
2.0000 ug/kg/min | INTRAVENOUS | Status: DC
  Administered 2019-06-05: 3 ug/kg/min via INTRAVENOUS
  Filled 2019-06-05: qty 250

## 2019-06-05 MED ORDER — LACTATED RINGERS IV SOLN: 500.0000 mL | Freq: Once | INTRAVENOUS | Status: DC | PRN

## 2019-06-05 MED ORDER — BISACODYL 5 MG PO TBEC
10.0000 mg | DELAYED_RELEASE_TABLET | Freq: Every day | ORAL | Status: DC
  Administered 2019-06-06 – 2019-06-09 (×4): 10 mg via ORAL
  Filled 2019-06-05 (×6): qty 2

## 2019-06-05 MED ORDER — FAMOTIDINE IN NACL 20-0.9 MG/50ML-% IV SOLN
20.0000 mg | Freq: Two times a day (BID) | INTRAVENOUS | Status: AC
  Administered 2019-06-05 (×2): 20 mg via INTRAVENOUS
  Filled 2019-06-05: qty 50

## 2019-06-05 MED ORDER — METOPROLOL TARTRATE 5 MG/5ML IV SOLN: 2.5000 mg | INTRAVENOUS | Status: DC | PRN

## 2019-06-05 MED ORDER — FENTANYL CITRATE (PF) 250 MCG/5ML IJ SOLN
INTRAMUSCULAR | Status: DC | PRN
  Administered 2019-06-05: 350 ug via INTRAVENOUS
  Administered 2019-06-05: 100 ug via INTRAVENOUS
  Administered 2019-06-05: 250 ug via INTRAVENOUS
  Administered 2019-06-05: 150 ug via INTRAVENOUS
  Administered 2019-06-05: 50 ug via INTRAVENOUS
  Administered 2019-06-05: 100 ug via INTRAVENOUS
  Administered 2019-06-05: 200 ug via INTRAVENOUS
  Administered 2019-06-05 (×2): 150 ug via INTRAVENOUS

## 2019-06-05 MED ORDER — PROPOFOL 10 MG/ML IV BOLUS
INTRAVENOUS | Status: AC
  Filled 2019-06-05: qty 20

## 2019-06-05 MED ORDER — SODIUM CHLORIDE 0.9 % IV SOLN: 250.0000 mL | INTRAVENOUS | Status: DC

## 2019-06-05 MED ORDER — CHLORHEXIDINE GLUCONATE 4 % EX LIQD: 30.0000 mL | CUTANEOUS | Status: DC

## 2019-06-05 MED ORDER — PROTAMINE SULFATE 10 MG/ML IV SOLN
INTRAVENOUS | Status: AC
  Filled 2019-06-05: qty 5

## 2019-06-05 MED ORDER — ONDANSETRON HCL 4 MG/2ML IJ SOLN
4.0000 mg | Freq: Four times a day (QID) | INTRAMUSCULAR | Status: DC | PRN
  Administered 2019-06-06 (×3): 4 mg via INTRAVENOUS
  Filled 2019-06-05 (×3): qty 2

## 2019-06-05 MED ORDER — MIDAZOLAM HCL 2 MG/2ML IJ SOLN: 2.0000 mg | INTRAMUSCULAR | Status: DC | PRN

## 2019-06-05 MED ORDER — CHLORHEXIDINE GLUCONATE 0.12% ORAL RINSE (MEDLINE KIT)
15.0000 mL | Freq: Two times a day (BID) | OROMUCOSAL | Status: DC
  Administered 2019-06-05: 15 mL via OROMUCOSAL

## 2019-06-05 MED ORDER — SODIUM CHLORIDE (PF) 0.9 % IJ SOLN
OROMUCOSAL | Status: DC | PRN
  Administered 2019-06-05 (×3): 4 mL via TOPICAL

## 2019-06-05 MED ORDER — FENTANYL CITRATE (PF) 250 MCG/5ML IJ SOLN
INTRAMUSCULAR | Status: AC
  Filled 2019-06-05: qty 25

## 2019-06-05 MED ORDER — FENTANYL CITRATE (PF) 250 MCG/5ML IJ SOLN
INTRAMUSCULAR | Status: AC
  Filled 2019-06-05: qty 5

## 2019-06-05 MED ORDER — HEPARIN SODIUM (PORCINE) 1000 UNIT/ML IJ SOLN
INTRAMUSCULAR | Status: AC
  Filled 2019-06-05: qty 1

## 2019-06-05 MED ORDER — ACETAMINOPHEN 650 MG RE SUPP
650.0000 mg | Freq: Once | RECTAL | Status: AC
  Administered 2019-06-05: 650 mg via RECTAL

## 2019-06-05 MED ORDER — 0.9 % SODIUM CHLORIDE (POUR BTL) OPTIME
TOPICAL | Status: DC | PRN
  Administered 2019-06-05: 6000 mL

## 2019-06-05 MED ORDER — ALBUMIN HUMAN 5 % IV SOLN
250.0000 mL | INTRAVENOUS | Status: AC | PRN
  Administered 2019-06-05 – 2019-06-06 (×4): 12.5 g via INTRAVENOUS
  Filled 2019-06-05 (×2): qty 250

## 2019-06-05 MED ORDER — POTASSIUM CHLORIDE 10 MEQ/50ML IV SOLN
10.0000 meq | INTRAVENOUS | Status: AC
  Administered 2019-06-05 (×3): 10 meq via INTRAVENOUS

## 2019-06-05 MED ORDER — OXYCODONE HCL 5 MG PO TABS: 5.0000 mg | ORAL_TABLET | ORAL | Status: DC | PRN

## 2019-06-05 MED ORDER — CHLORHEXIDINE GLUCONATE 0.12 % MT SOLN
OROMUCOSAL | Status: AC
  Administered 2019-06-05: 15 mL
  Filled 2019-06-05: qty 15

## 2019-06-05 MED ORDER — INSULIN REGULAR BOLUS VIA INFUSION
0.0000 [IU] | Freq: Three times a day (TID) | INTRAVENOUS | Status: DC
  Filled 2019-06-05: qty 10

## 2019-06-05 MED ORDER — SODIUM CHLORIDE 0.45 % IV SOLN
INTRAVENOUS | Status: DC | PRN
  Administered 2019-06-05: 14:00:00 via INTRAVENOUS

## 2019-06-05 MED ORDER — ALBUTEROL SULFATE (2.5 MG/3ML) 0.083% IN NEBU
2.5000 mg | INHALATION_SOLUTION | Freq: Four times a day (QID) | RESPIRATORY_TRACT | Status: DC
  Administered 2019-06-05 – 2019-06-06 (×2): 2.5 mg via RESPIRATORY_TRACT
  Filled 2019-06-05 (×4): qty 3

## 2019-06-05 MED ORDER — ROCURONIUM BROMIDE 10 MG/ML (PF) SYRINGE
PREFILLED_SYRINGE | INTRAVENOUS | Status: AC
  Filled 2019-06-05: qty 10

## 2019-06-05 MED ORDER — METOPROLOL TARTRATE 12.5 MG HALF TABLET
12.5000 mg | ORAL_TABLET | Freq: Two times a day (BID) | ORAL | Status: DC
  Administered 2019-06-06 – 2019-06-07 (×3): 12.5 mg via ORAL
  Filled 2019-06-05 (×3): qty 1

## 2019-06-05 MED ORDER — METOPROLOL TARTRATE 12.5 MG HALF TABLET: 12.5000 mg | ORAL_TABLET | Freq: Once | ORAL | Status: DC

## 2019-06-05 MED ORDER — NITROGLYCERIN IN D5W 200-5 MCG/ML-% IV SOLN: 0.0000 ug/min | INTRAVENOUS | Status: DC

## 2019-06-05 MED ORDER — MIDAZOLAM HCL (PF) 10 MG/2ML IJ SOLN
INTRAMUSCULAR | Status: AC
  Filled 2019-06-05: qty 2

## 2019-06-05 MED ORDER — CHLORHEXIDINE GLUCONATE 0.12 % MT SOLN
15.0000 mL | Freq: Once | OROMUCOSAL | Status: AC
  Administered 2019-06-05: 15 mL via OROMUCOSAL

## 2019-06-05 MED ORDER — ASPIRIN 81 MG PO CHEW
324.0000 mg | CHEWABLE_TABLET | Freq: Every day | ORAL | Status: DC
  Filled 2019-06-05 (×2): qty 4

## 2019-06-05 MED ORDER — BISACODYL 10 MG RE SUPP: 10.0000 mg | Freq: Every day | RECTAL | Status: DC

## 2019-06-05 MED ORDER — PROPOFOL 10 MG/ML IV BOLUS
INTRAVENOUS | Status: DC | PRN
  Administered 2019-06-05: 20 mg via INTRAVENOUS
  Administered 2019-06-05: 40 mg via INTRAVENOUS

## 2019-06-05 MED ORDER — ALBUMIN HUMAN 5 % IV SOLN
INTRAVENOUS | Status: DC | PRN
  Administered 2019-06-05: 12:00:00 via INTRAVENOUS

## 2019-06-05 MED ORDER — SODIUM CHLORIDE 0.9 % IV SOLN
INTRAVENOUS | Status: DC | PRN
  Administered 2019-06-05: 13:00:00 via INTRAVENOUS

## 2019-06-05 MED ORDER — LATANOPROST 0.005 % OP SOLN
1.0000 [drp] | Freq: Every day | OPHTHALMIC | Status: DC
  Administered 2019-06-05 – 2019-06-10 (×6): 1 [drp] via OPHTHALMIC
  Filled 2019-06-05: qty 2.5

## 2019-06-05 MED ORDER — ARTIFICIAL TEARS OPHTHALMIC OINT
TOPICAL_OINTMENT | OPHTHALMIC | Status: DC | PRN
  Administered 2019-06-05: 1 via OPHTHALMIC

## 2019-06-05 MED ORDER — MORPHINE SULFATE (PF) 2 MG/ML IV SOLN
1.0000 mg | INTRAVENOUS | Status: DC | PRN
  Administered 2019-06-05 (×4): 1 mg via INTRAVENOUS
  Filled 2019-06-05 (×2): qty 1

## 2019-06-05 MED ORDER — DOCUSATE SODIUM 100 MG PO CAPS
200.0000 mg | ORAL_CAPSULE | Freq: Every day | ORAL | Status: DC
  Administered 2019-06-06 – 2019-06-10 (×5): 200 mg via ORAL
  Filled 2019-06-05 (×6): qty 2

## 2019-06-05 MED ORDER — PANTOPRAZOLE SODIUM 40 MG PO TBEC
40.0000 mg | DELAYED_RELEASE_TABLET | Freq: Every day | ORAL | Status: DC
  Administered 2019-06-07 – 2019-06-11 (×5): 40 mg via ORAL
  Filled 2019-06-05 (×4): qty 1

## 2019-06-05 MED ORDER — TRAMADOL HCL 50 MG PO TABS
50.0000 mg | ORAL_TABLET | ORAL | Status: DC | PRN
  Administered 2019-06-05 – 2019-06-09 (×6): 100 mg via ORAL
  Filled 2019-06-05 (×6): qty 2

## 2019-06-05 MED ORDER — INSULIN REGULAR(HUMAN) IN NACL 100-0.9 UT/100ML-% IV SOLN: INTRAVENOUS | Status: DC

## 2019-06-05 MED ORDER — HEPARIN SODIUM (PORCINE) 1000 UNIT/ML IJ SOLN
INTRAMUSCULAR | Status: DC | PRN
  Administered 2019-06-05: 2000 [IU] via INTRAVENOUS
  Administered 2019-06-05: 28000 [IU] via INTRAVENOUS

## 2019-06-05 MED ORDER — ROCURONIUM BROMIDE 10 MG/ML (PF) SYRINGE
PREFILLED_SYRINGE | INTRAVENOUS | Status: DC | PRN
  Administered 2019-06-05: 100 mg via INTRAVENOUS
  Administered 2019-06-05 (×2): 50 mg via INTRAVENOUS

## 2019-06-05 MED ORDER — PROTAMINE SULFATE 10 MG/ML IV SOLN
INTRAVENOUS | Status: DC | PRN
  Administered 2019-06-05: 300 mg via INTRAVENOUS

## 2019-06-05 MED ORDER — LACTATED RINGERS IV SOLN: INTRAVENOUS | Status: DC

## 2019-06-05 MED ORDER — ACETAMINOPHEN 160 MG/5ML PO SOLN: 1000.0000 mg | Freq: Four times a day (QID) | ORAL | Status: AC

## 2019-06-05 MED ORDER — DRONEDARONE HCL 400 MG PO TABS
400.0000 mg | ORAL_TABLET | Freq: Two times a day (BID) | ORAL | Status: DC
  Administered 2019-06-05 – 2019-06-11 (×12): 400 mg via ORAL
  Filled 2019-06-05 (×12): qty 1

## 2019-06-05 MED ORDER — SODIUM CHLORIDE 0.9% FLUSH: 3.0000 mL | INTRAVENOUS | Status: DC | PRN

## 2019-06-05 MED ORDER — ASPIRIN EC 325 MG PO TBEC
325.0000 mg | DELAYED_RELEASE_TABLET | Freq: Every day | ORAL | Status: DC
  Administered 2019-06-06 – 2019-06-11 (×6): 325 mg via ORAL
  Filled 2019-06-05 (×6): qty 1

## 2019-06-05 MED ORDER — ORAL CARE MOUTH RINSE
15.0000 mL | OROMUCOSAL | Status: DC
  Administered 2019-06-05: 15 mL via OROMUCOSAL

## 2019-06-05 SURGICAL SUPPLY — 104 items
ADAPTER CARDIO PERF ANTE/RETRO (ADAPTER) ×4 IMPLANT
BAG DECANTER FOR FLEXI CONT (MISCELLANEOUS) ×4 IMPLANT
BANDAGE ACE 4X5 VEL STRL LF (GAUZE/BANDAGES/DRESSINGS) ×4 IMPLANT
BANDAGE ACE 6X5 VEL STRL LF (GAUZE/BANDAGES/DRESSINGS) ×4 IMPLANT
BANDAGE ELASTIC 4 VELCRO ST LF (GAUZE/BANDAGES/DRESSINGS) ×4 IMPLANT
BANDAGE ELASTIC 6 VELCRO ST LF (GAUZE/BANDAGES/DRESSINGS) ×4 IMPLANT
BASKET HEART  (ORDER IN 25'S) (MISCELLANEOUS) ×1
BASKET HEART (ORDER IN 25'S) (MISCELLANEOUS) ×1
BASKET HEART (ORDER IN 25S) (MISCELLANEOUS) ×2 IMPLANT
BENZOIN TINCTURE PRP APPL 2/3 (GAUZE/BANDAGES/DRESSINGS) ×12 IMPLANT
BLADE CLIPPER SURG (BLADE) IMPLANT
BLADE MINI RND TIP GREEN BEAV (BLADE) ×4 IMPLANT
BLADE STERNUM SYSTEM 6 (BLADE) ×4 IMPLANT
BLADE SURG 12 STRL SS (BLADE) ×4 IMPLANT
BNDG GAUZE ELAST 4 BULKY (GAUZE/BANDAGES/DRESSINGS) ×4 IMPLANT
CANISTER SUCT 3000ML PPV (MISCELLANEOUS) ×4 IMPLANT
CANNULA GUNDRY RCSP 15FR (MISCELLANEOUS) ×4 IMPLANT
CATH CPB KIT VANTRIGT (MISCELLANEOUS) ×4 IMPLANT
CATH ROBINSON RED A/P 18FR (CATHETERS) ×12 IMPLANT
CATH THORACIC 36FR RT ANG (CATHETERS) ×4 IMPLANT
CLOSURE STERI-STRIP 1/2X4 (GAUZE/BANDAGES/DRESSINGS) ×1
CLOSURE WOUND 1/2 X4 (GAUZE/BANDAGES/DRESSINGS) ×3
CLSR STERI-STRIP ANTIMIC 1/2X4 (GAUZE/BANDAGES/DRESSINGS) ×3 IMPLANT
COVER WAND RF STERILE (DRAPES) IMPLANT
CRADLE DONUT ADULT HEAD (MISCELLANEOUS) ×4 IMPLANT
DRAIN CHANNEL 32F RND 10.7 FF (WOUND CARE) ×4 IMPLANT
DRAPE CARDIOVASCULAR INCISE (DRAPES) ×2
DRAPE SLUSH/WARMER DISC (DRAPES) ×4 IMPLANT
DRAPE SRG 135X102X78XABS (DRAPES) ×2 IMPLANT
DRSG AQUACEL AG ADV 3.5X14 (GAUZE/BANDAGES/DRESSINGS) ×4 IMPLANT
ELECT BLADE 4.0 EZ CLEAN MEGAD (MISCELLANEOUS) ×4
ELECT BLADE 6.5 EXT (BLADE) ×4 IMPLANT
ELECT CAUTERY BLADE 6.4 (BLADE) ×4 IMPLANT
ELECT REM PT RETURN 9FT ADLT (ELECTROSURGICAL) ×8
ELECTRODE BLDE 4.0 EZ CLN MEGD (MISCELLANEOUS) ×2 IMPLANT
ELECTRODE REM PT RTRN 9FT ADLT (ELECTROSURGICAL) ×4 IMPLANT
FELT TEFLON 1X6 (MISCELLANEOUS) ×8 IMPLANT
GAUZE SPONGE 4X4 12PLY STRL (GAUZE/BANDAGES/DRESSINGS) ×8 IMPLANT
GAUZE SPONGE 4X4 12PLY STRL LF (GAUZE/BANDAGES/DRESSINGS) ×8 IMPLANT
GLOVE BIO SURGEON STRL SZ7.5 (GLOVE) ×24 IMPLANT
GLOVE BIOGEL PI IND STRL 6 (GLOVE) ×4 IMPLANT
GLOVE BIOGEL PI IND STRL 6.5 (GLOVE) ×8 IMPLANT
GLOVE BIOGEL PI INDICATOR 6 (GLOVE) ×4
GLOVE BIOGEL PI INDICATOR 6.5 (GLOVE) ×8
GOWN STRL REUS W/ TWL LRG LVL3 (GOWN DISPOSABLE) ×20 IMPLANT
GOWN STRL REUS W/TWL LRG LVL3 (GOWN DISPOSABLE) ×20
HEMOSTAT POWDER SURGIFOAM 1G (HEMOSTASIS) ×12 IMPLANT
HEMOSTAT SURGICEL 2X14 (HEMOSTASIS) ×4 IMPLANT
INDICATOR STEAM BIO RAPID (STERILIZATION PRODUCTS) IMPLANT
INSERT FOGARTY XLG (MISCELLANEOUS) IMPLANT
KIT BASIN OR (CUSTOM PROCEDURE TRAY) ×4 IMPLANT
KIT SUCTION CATH 14FR (SUCTIONS) ×4 IMPLANT
KIT TURNOVER KIT B (KITS) ×4 IMPLANT
KIT VASOVIEW HEMOPRO 2 VH 4000 (KITS) ×4 IMPLANT
LEAD PACING MYOCARDI (MISCELLANEOUS) ×8 IMPLANT
MARKER GRAFT CORONARY BYPASS (MISCELLANEOUS) ×12 IMPLANT
NS IRRIG 1000ML POUR BTL (IV SOLUTION) ×24 IMPLANT
PACK E OPEN HEART (SUTURE) ×4 IMPLANT
PACK OPEN HEART (CUSTOM PROCEDURE TRAY) ×4 IMPLANT
PAD ARMBOARD 7.5X6 YLW CONV (MISCELLANEOUS) ×8 IMPLANT
PAD ELECT DEFIB RADIOL ZOLL (MISCELLANEOUS) ×4 IMPLANT
PENCIL BUTTON HOLSTER BLD 10FT (ELECTRODE) ×4 IMPLANT
POWDER SURGICEL 3.0 GRAM (HEMOSTASIS) ×8 IMPLANT
PUNCH AORTIC ROTATE 4.0MM (MISCELLANEOUS) IMPLANT
PUNCH AORTIC ROTATE 4.5MM 8IN (MISCELLANEOUS) ×4 IMPLANT
PUNCH AORTIC ROTATE 5MM 8IN (MISCELLANEOUS) IMPLANT
SET CARDIOPLEGIA MPS 5001102 (MISCELLANEOUS) ×4 IMPLANT
SPONGE LAP 18X18 RF (DISPOSABLE) ×4 IMPLANT
SPONGE LAP 4X18 RFD (DISPOSABLE) ×4 IMPLANT
STRIP CLOSURE SKIN 1/2X4 (GAUZE/BANDAGES/DRESSINGS) ×9 IMPLANT
SURGIFLO W/THROMBIN 8M KIT (HEMOSTASIS) ×4 IMPLANT
SUT BONE WAX W31G (SUTURE) ×4 IMPLANT
SUT MNCRL AB 4-0 PS2 18 (SUTURE) IMPLANT
SUT PROLENE 3 0 SH DA (SUTURE) IMPLANT
SUT PROLENE 3 0 SH1 36 (SUTURE) IMPLANT
SUT PROLENE 4 0 RB 1 (SUTURE) ×4
SUT PROLENE 4 0 SH DA (SUTURE) ×4 IMPLANT
SUT PROLENE 4-0 RB1 .5 CRCL 36 (SUTURE) ×4 IMPLANT
SUT PROLENE 5 0 C 1 36 (SUTURE) IMPLANT
SUT PROLENE 6 0 C 1 30 (SUTURE) IMPLANT
SUT PROLENE 6 0 CC (SUTURE) ×28 IMPLANT
SUT PROLENE 8 0 BV175 6 (SUTURE) IMPLANT
SUT PROLENE BLUE 7 0 (SUTURE) ×8 IMPLANT
SUT SILK  1 MH (SUTURE)
SUT SILK 1 MH (SUTURE) IMPLANT
SUT SILK 2 0 SH CR/8 (SUTURE) ×4 IMPLANT
SUT SILK 3 0 SH CR/8 (SUTURE) IMPLANT
SUT STEEL 6MS V (SUTURE) ×4 IMPLANT
SUT STEEL SZ 6 DBL 3X14 BALL (SUTURE) ×8 IMPLANT
SUT VIC AB 1 CTX 36 (SUTURE) ×4
SUT VIC AB 1 CTX36XBRD ANBCTR (SUTURE) ×4 IMPLANT
SUT VIC AB 2-0 CT1 27 (SUTURE) ×12
SUT VIC AB 2-0 CT1 TAPERPNT 27 (SUTURE) ×12 IMPLANT
SUT VIC AB 2-0 CTX 27 (SUTURE) IMPLANT
SUT VIC AB 3-0 X1 27 (SUTURE) ×24 IMPLANT
SYSTEM SAHARA CHEST DRAIN ATS (WOUND CARE) ×4 IMPLANT
TAPE CLOTH SURG 4X10 WHT LF (GAUZE/BANDAGES/DRESSINGS) ×4 IMPLANT
TOWEL GREEN STERILE (TOWEL DISPOSABLE) ×4 IMPLANT
TOWEL GREEN STERILE FF (TOWEL DISPOSABLE) ×4 IMPLANT
TRAY FOLEY SLVR 16FR TEMP STAT (SET/KITS/TRAYS/PACK) ×4 IMPLANT
TUBING LAP HI FLOW INSUFFLATIO (TUBING) ×4 IMPLANT
UNDERPAD 30X30 (UNDERPADS AND DIAPERS) ×4 IMPLANT
WATER STERILE IRR 1000ML POUR (IV SOLUTION) ×8 IMPLANT
YANKAUER SUCT BULB TIP NO VENT (SUCTIONS) ×4 IMPLANT

## 2019-06-05 NOTE — Anesthesia Procedure Notes (Signed)
Arterial Line Insertion Start/End6/08/2019 6:58 AM Performed by: Wilburn Cornelia, CRNA, CRNA  Patient location: Pre-op. Preanesthetic checklist: patient identified, IV checked, site marked, risks and benefits discussed, surgical consent, monitors and equipment checked, pre-op evaluation, timeout performed and anesthesia consent Lidocaine 1% used for infiltration Right, radial was placed Catheter size: 20 G Hand hygiene performed  and maximum sterile barriers used   Attempts: 2 Procedure performed without using ultrasound guided technique. Following insertion, dressing applied and Biopatch. Post procedure assessment: normal and unchanged  Patient tolerated the procedure well with no immediate complications. Additional procedure comments: 1 attempt by B. Jaquay Morneault, CRNA, 1 attempt by Vonda Antigua, CRNA.

## 2019-06-05 NOTE — Progress Notes (Signed)
  Echocardiogram Echocardiogram Transesophageal has been performed.  Derrick [name] 06/05/2019, 8:38 AM

## 2019-06-05 NOTE — Anesthesia Procedure Notes (Signed)
Procedure Name: Intubation Date/Time: 06/05/2019 7:52 AM Performed by: Candis Shine, CRNA Pre-anesthesia Checklist: Patient identified, Emergency Drugs available, Suction available and Patient being monitored Patient Re-evaluated:Patient Re-evaluated prior to induction Oxygen Delivery Method: Circle System Utilized Preoxygenation: Pre-oxygenation with 100% oxygen Induction Type: IV induction Ventilation: Mask ventilation without difficulty Laryngoscope Size: Mac and 4 Grade View: Grade I Tube type: Oral Tube size: 8.0 mm Number of attempts: 1 Airway Equipment and Method: Stylet Placement Confirmation: ETT inserted through vocal cords under direct vision,  positive ETCO2 and breath sounds checked- equal and bilateral Secured at: 22 cm Tube secured with: Tape Dental Injury: Teeth and Oropharynx as per pre-operative assessment

## 2019-06-05 NOTE — Anesthesia Preprocedure Evaluation (Signed)
Anesthesia Evaluation  Patient identified by MRN, date of birth, ID band Patient awake    Reviewed: Allergy & Precautions, NPO status , Patient's Chart, lab work & pertinent test results, reviewed documented beta blocker date and time   History of Anesthesia Complications Negative for: history of anesthetic complications  Airway Mallampati: III  TM Distance: >3 FB Neck ROM: Full    Dental  (+) Dental Advisory Given   Pulmonary asthma , sleep apnea , COPD,    breath sounds clear to auscultation       Cardiovascular hypertension, Pt. on medications and Pt. on home beta blockers + angina + CAD and + Past MI  + pacemaker + Valvular Problems/Murmurs  Rhythm:Regular     Neuro/Psych neg Seizures  Neuromuscular disease negative psych ROS   GI/Hepatic Neg liver ROS, GERD  Medicated and Controlled,  Endo/Other  negative endocrine ROS  Renal/GU negative Renal ROS     Musculoskeletal  (+) Arthritis ,   Abdominal   Peds  Hematology negative hematology ROS (+)   Anesthesia Other Findings   Reproductive/Obstetrics                             Anesthesia Physical Anesthesia Plan  ASA: IV  Anesthesia Plan: General   Post-op Pain Management:    Induction: Intravenous  PONV Risk Score and Plan: 2 and Treatment may vary due to age or medical condition  Airway Management Planned: Oral ETT  Additional Equipment: Arterial line, CVP, PA Cath, Ultrasound Guidance Line Placement and TEE  Intra-op Plan:   Post-operative Plan: Post-operative intubation/ventilation  Informed Consent: I have reviewed the patients History and Physical, chart, labs and discussed the procedure including the risks, benefits and alternatives for the proposed anesthesia with the patient or authorized representative who has indicated his/her understanding and acceptance.     Dental advisory given  Plan Discussed with: CRNA  and Surgeon  Anesthesia Plan Comments:         Anesthesia Quick Evaluation

## 2019-06-05 NOTE — Progress Notes (Signed)
Patient ID: Derrick [name], male   DOB: 09/24/1941, 78 y.o.   MRN: [id number]  TCTS Evening Rounds:   Hemodynamically stable  CI = 3 on dobut 3.  Extubated  Urine output good  CT output low  CBC    Component Value Date/Time   WBC 13.3 (H) 06/05/2019 1354   RBC 3.95 (L) 06/05/2019 1354   HGB 10.9 (L) 06/05/2019 1806   HGB 14.3 05/29/2018 0806   HGB 14.1 07/27/2016 0748   HCT 32.0 (L) 06/05/2019 1806   HCT 40.2 05/29/2018 0806   HCT 40.8 07/27/2016 0748   PLT 135 (L) 06/05/2019 1354   PLT 187 05/29/2018 0806   MCV 90.9 06/05/2019 1354   MCV 90 05/29/2018 0806   MCV 88.8 07/27/2016 0748   MCH 30.9 06/05/2019 1354   MCHC 34.0 06/05/2019 1354   RDW 12.5 06/05/2019 1354   RDW 13.1 05/29/2018 0806   RDW 14.7 (H) 07/27/2016 0748   LYMPHSABS 1.0 02/13/2019 0846   LYMPHSABS 0.9 05/29/2018 0806   LYMPHSABS 1.0 07/27/2016 0748   MONOABS 0.5 02/13/2019 0846   MONOABS 0.5 07/27/2016 0748   EOSABS 0.1 02/13/2019 0846   EOSABS 0.1 05/29/2018 0806   BASOSABS 0.1 02/13/2019 0846   BASOSABS 0.0 05/29/2018 0806   BASOSABS 0.1 07/27/2016 0748     BMET    Component Value Date/Time   NA 139 06/05/2019 1806   NA 141 05/29/2018 0806   NA 141 07/27/2016 0748   K 4.1 06/05/2019 1806   K 3.9 07/27/2016 0748   CL 102 06/04/2019 0834   CL 104 03/23/2013 0855   CO2 27 06/04/2019 0834   CO2 30 (H) 07/27/2016 0748   GLUCOSE 105 (H) 06/05/2019 1355   GLUCOSE 122 07/27/2016 0748   GLUCOSE 98 03/23/2013 0855   BUN 17 06/04/2019 0834   BUN 14 05/29/2018 0806   BUN 16.0 07/27/2016 0748   CREATININE 0.97 06/04/2019 0834   CREATININE 0.93 11/10/2018 1618   CREATININE 1.0 07/27/2016 0748   CALCIUM 10.1 06/04/2019 0834   CALCIUM 10.5 (H) 07/27/2016 0748   GFRNONAA >60 06/04/2019 0834   GFRAA >60 06/04/2019 0834     A/P:  Stable postop course. Continue current plans. Uses CPAP at night. Ordered.

## 2019-06-05 NOTE — Brief Op Note (Signed)
06/05/2019  12:13 PM  PATIENT:  Derrick [name]  78 y.o. male  PRE-OPERATIVE DIAGNOSIS:  CAD  POST-OPERATIVE DIAGNOSIS:  CAD  PROCEDURE:  Procedure(s): CORONARY ARTERY BYPASS GRAFTING (CABG) TIMES  USING LEFT INTERNAL MAMMARY ARTERY AND RIGHT GREATER SEPHANOUS VEIN COLLECTED ENDOSCOPICALLY (N/A) TRANSESOPHAGEAL ECHOCARDIOGRAM (TEE) (N/A) LIMA-LAD SVG-RAMUS SVG-PD  LA appendage very small - 25 mm and was not ligated  SURGEON:  Surgeon(s) and Role:    [name], MD - Primary  PHYSICIAN ASSISTANT: WAYNE GOLD PA-C  ANESTHESIA:   general  EBL:  250 cc  BLOOD ADMINISTERED:none  DRAINS: ROUTINE PLEURAL AND PERICARIDIAL CHEST TUBES   LOCAL MEDICATIONS USED:  NONE  SPECIMEN:  No Specimen  DISPOSITION OF SPECIMEN:  N/A  COUNTS:  YES  TOURNIQUET:  * No tourniquets in log *  DICTATION: .Other Dictation: Dictation Number [id number]  PLAN OF CARE: Admit to inpatient   PATIENT DISPOSITION:  ICU - intubated and hemodynamically stable.   Delay start of Pharmacological VTE agent (>24hrs) due to surgical blood loss or risk of bleeding: yes  COMPLICATIONS: NO KNOWN

## 2019-06-05 NOTE — Progress Notes (Signed)
1 hr post-extubation ABG acidotic -- Dr. Cyndia Bent made aware on evening rounds.  1 amp of bicarb and CPAP ordered.   RN will continue to monitor.

## 2019-06-05 NOTE — H&P (Signed)
PCP is [name], MD Referring Provider is No ref. provider found  No chief complaint on file. Patient examined, images of recent coronary angiogram and echocardiogram personally reviewed and counseled with patient  HPI: 79 year old nondiabetic non-smoker with history of CAD status post DMI[mild] 2012 treated with PCI of RCA.  He took Plavix for a year.  He now has symptoms of exertional angina relieved by rest.  Cardiac catheterization by Dr. Burt Knack demonstrates a 75% hazy left main stenosis with a patent RCA stent but with 60 to 70% distal stenosis.  Echo shows normal LV function.  He has a mildly calcified aortic valve without significant aortic stenosis or insufficiency.  RV function appears normal. The patient has paroxysmal atrial fibrillation with past  problems with rate control on meds so he had a pacemaker placed in 2017.  He is predominantly in sinus rhythm and is on Eliquis.  Patient has history of pulmonary sarcoidosis.  Diagnosis made with right lung biopsy in 2012 in [place].  He has intermittent problems with wheezing productive cough treated with as needed antibiotics and prednisone taper.  He does not have a pulmonary physician in [place].  Last CT of chest was 2012.  Patient also has history of mono gammopathy, left hemicolectomy for colon cancer remotely and peripheral neuropathy.  He recently had an inguinal hernia repair in February 2020 without anesthesia or bleeding complications.  Past Medical History:  Diagnosis Date  . Anemia   . BENIGN PROSTATIC HYPERTROPHY, WITH OBSTRUCTION 05/28/2010  . CAD (coronary artery disease)    a. s/p NSTEMI 06/01/11: DES to RCA;  b. cath 06/25/11:   dLM 50-60% (FFR 0.87), prox to mid LAD 40-50%, D1 50%, pCFX 50%, RCA stent ok, dPDA 80%, EF 55-60%.  His FFR was felt to be negative and therefore medical therapy was recommended ;  echo 6/12: EF 55-60%, mild AS   . Chronic back pain   . CIDP (chronic inflammatory demyelinating  polyneuropathy) ([facility]) 04/11/2012  . Colon cancer ([facility]) dx'd 2000   "left"  . COLONIC POLYPS, ADENOMATOUS, HX OF 05/28/2010  . Complication of anesthesia    "stopped breathing; related to my sleep apnea" & urinary retention   . COPD (chronic obstructive pulmonary disease) ([facility])    "associated w/lung sarcoidosis"  . 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 DISH (diffuse idiopathic skeletal hyperostosis) 12/02/2015  . Diverticulitis   . GENERALIZED OSTEOARTHROSIS UNSPECIFIED SITE 05/28/2010  . GERD 05/28/2010  . Heart murmur   . History of gout   . History of kidney stones   . HYPERLIPIDEMIA 05/28/2010  . Hypertension   . IgM lambda paraproteinemia   . 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 Malignant neoplasm of descending colon ([facility]) 05/28/2010  . MITRAL VALVE PROLAPSE 05/28/2010  . 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 NSTEMI (non-ST elevated myocardial infarction) ([facility]) 06/03/11  . 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  . OSA on CPAP 05/28/2010  . PALPITATIONS, CHRONIC 05/28/2010  . Parotid gland pain 2010   infection  . Peripheral neuropathy    "tx'd w/targeted chemo" (05/12/2016)  . Pneumonia 2-3 times  . Presence of permanent cardiac pacemaker   . 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 Sarcoidosis    pulmonalis  . UNSPECIFIED INFLAMMATORY AND TOXIC NEUROPATHY 05/28/2010  . Vision loss   . VITAMIN D DEFICIENCY 05/28/2010    Past Surgical History:  Procedure Laterality Date  . APPENDECTOMY  1954  . CATARACT EXTRACTION W/ INTRAOCULAR LENS IMPLANT  Left   . COLON SURGERY  2000   decending colon   . CORONARY ANGIOPLASTY WITH STENT PLACEMENT  06/03/11  . ELECTROPHYSIOLOGIC STUDY N/A 05/12/2016   Procedure: Cardioversion;  Surgeon: [name], MD;  Location: [facility];  Service: Cardiovascular;  Laterality: N/A;  . EP IMPLANTABLE DEVICE N/A 05/12/2016   Procedure: Pacemaker Implant;  Surgeon: [name], MD;   Location: [facility];  Service: Cardiovascular;  Laterality: N/A;  . EYE SURGERY    . INGUINAL HERNIA REPAIR Left 02/21/2019   Procedure: LEFT INGUINAL HERNIA REPAIR WITH MESH;  Surgeon: [name], MD;  Location: [facility];  Service: General;  Laterality: Left;  . INSERT / REPLACE / REMOVE PACEMAKER  05/12/2016  . INTRAVASCULAR PRESSURE WIRE/FFR STUDY N/A 05/15/2019   Procedure: INTRAVASCULAR PRESSURE WIRE/FFR STUDY;  Surgeon: [name], MD;  Location: [facility];  Service: Cardiovascular;  Laterality: N/A;  . KNEE ARTHROSCOPY Right 2003  . LEFT HEART CATH AND CORONARY ANGIOGRAPHY N/A 05/15/2019   Procedure: LEFT HEART CATH AND CORONARY ANGIOGRAPHY;  Surgeon: [name], MD;  Location: [facility];  Service: Cardiovascular;  Laterality: N/A;  . LEFT HEART CATHETERIZATION WITH CORONARY ANGIOGRAM N/A 03/31/2015   Procedure: LEFT HEART CATHETERIZATION WITH CORONARY ANGIOGRAM;  Surgeon: [name], MD;  Location: [facility];  Service: Cardiovascular;  Laterality: N/A;  . LUNG SURGERY  2001   "open lung dissection"  . MOHS SURGERY Left ~ 2008   ear  . PROSTATE BIOPSY  2010  . TONSILLECTOMY  1950    Family History  Problem Relation Age of Onset  . Arthritis Mother        severe  . Coronary artery disease Mother   . Colon polyps Mother   . Heart disease Mother   . Coronary artery disease Brother   . Cancer Brother        choriocarcinoma  . Arrhythmia Brother 26       Afib/Tachycardia  . Heart attack Brother   . Congestive Heart Failure Brother     Social History Social History   Tobacco Use  . Smoking status: Never Smoker  . Smokeless tobacco: Never Used  Substance Use Topics  . Alcohol use: Yes    Comment: occasional  . Drug use: No    Current Facility-Administered Medications  Medication Dose Route Frequency Provider Last Rate Last Dose  . 0.45 % sodium chloride infusion   Intravenous Continuous PRN Jadene Pierini E, PA-C   Stopped at  06/05/19 1526  . [START ON 06/06/2019] 0.9 %  sodium chloride infusion  250 mL Intravenous Continuous Gold, Wayne E, PA-C      . 0.9 %  sodium chloride infusion   Intravenous Continuous Gold, Wayne E, PA-C 20 mL/hr at 06/05/19 1340    . [START ON 06/06/2019] acetaminophen (TYLENOL) tablet 1,000 mg  1,000 mg Oral Q6H Gold, Wayne E, PA-C       Or  . [START ON 06/06/2019] acetaminophen (TYLENOL) solution 1,000 mg  1,000 mg Per Tube Q6H Gold, Wayne E, PA-C      . albumin human 5 % solution 12.5 g  250 mL Intravenous Q15 min PRN Gold, Wayne E, PA-C 999 mL/hr at 06/05/19 1416 12.5 g at 06/05/19 1416  . albuterol (PROVENTIL) (2.5 MG/3ML) 0.083% nebulizer solution 2.5 mg  2.5 mg Nebulization QID [name], [name], MD   2.5 mg at 06/05/19 1603  . [START ON 06/06/2019] aspirin EC tablet 325 mg  325 mg Oral Daily Gold, Wayne E, PA-C       Or  . Derrill Memo ON 06/06/2019] aspirin chewable tablet 324 mg  324 mg Per Tube Daily Gold, Wayne E, PA-C      . [START ON 06/06/2019] bisacodyl (DULCOLAX) EC tablet 10 mg  10 mg Oral Daily Gold, Wayne E, PA-C       Or  . Derrill Memo ON 06/06/2019] bisacodyl (DULCOLAX) suppository 10 mg  10 mg Rectal Daily Gold, Wayne E, PA-C      . cefUROXime (ZINACEF) 1.5 g in sodium chloride 0.9 % 100 mL IVPB  1.5 g Intravenous Q12H Gold, Wayne E, PA-C      . chlorhexidine gluconate (MEDLINE KIT) (PERIDEX) 0.12 % solution 15 mL  15 mL Mouth Rinse BID [name], [name], MD      . dexmedetomidine (PRECEDEX) 200 MCG/50ML (4 mcg/mL) infusion  0-0.7 mcg/kg/hr Intravenous Continuous Jadene Pierini E, PA-C   Stopped at 06/05/19 1501  . DOBUTamine (DOBUTREX) infusion 4000 mcg/mL  3 mcg/kg/min Intravenous Titrated [name], [name], MD 3.73 mL/hr at 06/05/19 1600 3 mcg/kg/min at 06/05/19 1600  . [START ON 06/06/2019] docusate sodium (COLACE) capsule 200 mg  200 mg Oral Daily Gold, Wayne E, PA-C      . dorzolamide-timolol (COSOPT) 22.3-6.8 MG/ML ophthalmic solution 1 drop  1 drop Both Eyes BID Gold, Wayne E, PA-C       . dronedarone (MULTAQ) tablet 400 mg  400 mg Oral BID WC [name], [name], MD      . famotidine (PEPCID) IVPB 20 mg premix  20 mg Intravenous Q12H Gold, Wilder Glade, PA-C   Stopped at 06/05/19 1419  . insulin regular bolus via infusion 0-10 Units  0-10 Units Intravenous TID WC Gold, Wayne E, PA-C      . insulin regular, human (MYXREDLIN) 100 units/ 100 mL infusion   Intravenous Continuous Gold, Wayne E, PA-C   Stopped at 06/05/19 1556  . lactated ringers infusion 500 mL  500 mL Intravenous Once PRN Gold, Wayne E, PA-C      . lactated ringers infusion   Intravenous Continuous Gold, Wayne E, PA-C 20 mL/hr at 06/05/19 1600    . lactated ringers infusion   Intravenous Continuous Gold, Wayne E, PA-C      . latanoprost (XALATAN) 0.005 % ophthalmic solution 1 drop  1 drop Both Eyes QHS Gold, Wayne E, PA-C      . magnesium sulfate IVPB 4 g 100 mL  4 g Intravenous Once Gold, Wayne E, PA-C 20 mL/hr at 06/05/19 1417 4 g at 06/05/19 1417  . MEDLINE mouth rinse  15 mL Mouth Rinse 10 times per day [name], [name], MD      . metoprolol tartrate (LOPRESSOR) tablet 12.5 mg  12.5 mg Oral BID Gold, Wayne E, PA-C       Or  . metoprolol tartrate (LOPRESSOR) 25 mg/10 mL oral suspension 12.5 mg  12.5 mg Per Tube BID Gold, Wayne E, PA-C      . metoprolol tartrate (LOPRESSOR) injection 2.5-5 mg  2.5-5 mg Intravenous Q2H PRN Gold, Wayne E, PA-C      . midazolam (VERSED) injection 2 mg  2 mg Intravenous Q1H PRN Gold, Wayne E, PA-C      . morphine 2 MG/ML injection 1-4 mg  1-4 mg Intravenous Q1H PRN Gold, Wayne E, PA-C      . nitroGLYCERIN 50 mg in dextrose 5 % 250 mL (0.2 mg/mL) infusion  0-100 mcg/min Intravenous Titrated Gold,  Hatch at 06/05/19 1343  . ondansetron (ZOFRAN) injection 4 mg  4 mg Intravenous Q6H PRN Gold, Wayne E, PA-C      . oxyCODONE (Oxy IR/ROXICODONE) immediate release tablet 5-10 mg  5-10 mg Oral Q3H PRN John Giovanni, PA-C      . [START ON 06/07/2019] pantoprazole (PROTONIX) EC tablet  40 mg  40 mg Oral Daily Gold, Wayne E, PA-C      . phenylephrine (NEOSYNEPHRINE) 20-0.9 MG/250ML-% infusion  0-100 mcg/min Intravenous Titrated Gold, Wayne E, PA-C 7.5 mL/hr at 06/05/19 1600 10 mcg/min at 06/05/19 1600  . potassium chloride 10 mEq in 50 mL *CENTRAL LINE* IVPB  10 mEq Intravenous Q1 Hr x 3 Gold, Wayne E, PA-C 50 mL/hr at 06/05/19 1628 10 mEq at 06/05/19 1628  . [START ON 06/06/2019] sodium chloride flush (NS) 0.9 % injection 3 mL  3 mL Intravenous Q12H Gold, Wayne E, PA-C      . [START ON 06/06/2019] sodium chloride flush (NS) 0.9 % injection 3 mL  3 mL Intravenous PRN Gold, Wayne E, PA-C      . traMADol (ULTRAM) tablet 50-100 mg  50-100 mg Oral Q4H PRN Gold, Wayne E, PA-C      . vancomycin (VANCOCIN) IVPB 1000 mg/200 mL premix  1,000 mg Intravenous Once Gold, Wayne E, PA-C        No Known Allergies  Review of Systems      Right-hand-dominant      Retired Chief Financial Officer       Never smoker       No history of thoracic trauma        Weight stable, COVID screening negative prior to cardiac cath last week        Recent onset of notable fatigue        No bleeding problems on Eliquis             Review of Systems :  [ y ] = yes, [  ] = no        General :  Weight gain [   ]    Weight loss  [   ]  Fatigue [  ]  Fever [  ]  Chills  [  ]                                          HEENT    Headache [  ]  Dizziness [  ]  Blurred vision [  ] Glaucoma  [  ]                          Nosebleeds [  ] Painful or loose teeth [  ]        Cardiac :  Chest pain/ pressure Blue.Reese  ]  Resting SOB [  ] exertional SOB Blue.Reese  ]                        Orthopnea [  ]  Pedal edema  [  ]  Palpitations [  ] Syncope/presyncope '[ ]'                         Paroxysmal nocturnal dyspnea [  ]         Pulmonary : cough Blue.Reese  ]  wheezing [  ]  Hemoptysis [  ] Sputum [  ] Snoring [  ]                              Pneumothorax [  ]  Sleep apnea [  ]        GI : Vomiting [  ]  Dysphagia [  ]  Melena  [  ]  Abdominal pain [  ]  BRBPR [  ]              Heart burn [  ]  Constipation [  ] Diarrhea  [  ] Colonoscopy [   ]        GU : Hematuria [  ]  Dysuria [  ]  Nocturia [  ] UTI's [  ]        Vascular : Claudication [  ]  Rest pain [  ]  DVT [  ] Vein stripping [  ] leg ulcers [  ]                          TIA [  ] Stroke [  ]  Varicose veins [  ]        NEURO :  Headaches  [  ] Seizures [  ] Vision changes [  ] Paresthesias [  ]                                               Musculoskeletal :  Arthritis Blue.Reese  ] Gout  [  ]  Back pain [  ]  Joint pain [  ]        Skin :  Rash [  ]  Melanoma [  ] Sores [  ]        Heme : Bleeding problems [  ]Clotting Disorders [  ] Anemia [  ]Blood Transfusion '[ ]'         Endocrine : Diabetes [  ] Heat or Cold intolerance [  ] Polyuria [  ]excessive thirst '[ ]'         Psych : Depression [  ]  Anxiety [  ]  Psych hospitalizations [  ] Memory change [  ]                                                                            BP 112/81 (BP Location: Right Arm)   Pulse 88   Temp 97.7 F (36.5 C)   Resp 14   Ht '5\' 9"'  (1.753 m)   Wt 82.9 kg   SpO2 100%   BMI 27.00 kg/m  Physical Exam     Physical Exam  General: Well-nourished healthy-appearing 78 year old HEENT: Normocephalic pupils equal , dentition adequate Neck: Supple without JVD, adenopathy, or bruit Chest: Clear to auscultation, symmetrical breath sounds, no rhonchi, no tenderness             or deformity.  Well-healed right VATS  scar Cardiovascular: Regular rate and rhythm, n2/6 murmur of aortic sclerosis, no gallop, peripheral pulses             palpable in all extremities Abdomen:  Soft, nontender, no palpable mass or organomegaly Extremities: Warm, well-perfused, no clubbing cyanosis edema or tenderness,              no venous stasis changes of the legs.  Mild varicosities right lower leg Rectal/GU: Deferred Neuro: Grossly non--focal and symmetrical throughout Skin: Clean and dry without rash or  ulceration   Diagnostic Tests: Significant 75% left main stenosis with class III exertional angina normal LV function Patent RCA PCI with distal 60% stenosis  Impression: Patient would benefit from multivessel CABG with grafting to the LAD, circumflex marginal and distal RCA.  Prior to surgery he had PFT and chest CT which demonstrated the pulmonary sarcoidosis was mild. H stopped Eliquis prior to surgery.Benefits and risks of CABG have been discussed in detail with the patient  Plan: CABG on 06-05-19 for L main stenosis and progressive angina   Len Childs, MD [facility] [phone]

## 2019-06-05 NOTE — Op Note (Signed)
NAME: Derrick [name], Derrick [name] [facility] MEDICAL RECORD [id number] ACCOUNT [id number] DATE OF BIRTH:12-18-1941 FACILITY: [facility] LOCATION: [facility] PHYSICIAN:Bralyn Espino VAN TRIGT III, MD  OPERATIVE REPORT  DATE OF PROCEDURE:  06/05/2019  OPERATION: 1.  Coronary artery bypass grafting x3 (left internal mammary artery to left anterior descending, saphenous vein graft to obtuse marginal 1, saphenous vein graft to posterior descending). 2.  Endoscopic harvest and open vein harvest of right leg greater saphenous vein.  SURGEON:  Tharon Aquas Trigt III, MD  ASSISTANT:  Jadene Pierini, PA-C  PREOPERATIVE DIAGNOSES:  Severe distal left main stenosis, previous percutaneous coronary intervention of the right coronary artery.  POSTOPERATIVE DIAGNOSES:  Severe distal left main stenosis, previous percutaneous coronary intervention of the right coronary artery.  ANESTHESIA:  General by Laurie Panda, MD.  CLINICAL PROCEDURE:  After informed consent had been documented in the preop holding and final questions addressed, the patient was brought to the operating room.  I had previously seen the patient in the office in consultation on at least 2 occasions,  and after reviewing his coronary angiogram and echocardiogram, I discussed the details of CABG with the patient, including the expected benefits and risks.  He understood the major aspects of the surgery including the location of the surgical incisions,  the use of general anesthesia and cardiopulmonary bypass, and the expected postoperative recovery.  He understood the potential risks of surgery to include stroke, bleeding, infection, organ failure, postoperative arrhythmia, postoperative pulmonary  problems and death.  He agreed to proceed with surgery under what I felt was an informed consent.  OPERATIVE FINDINGS: 1.  Small native saphenous vein with difficulty using the endoscopic technique which necessitated open vein harvest from the right thigh.  The remainder of the vein  was too small to use. 2.  Small native coronaries, but graftable. 3.  No blood products required. 4.  Preserved left ventricular function on echocardiogram after separation from cardiopulmonary bypass.  DESCRIPTION OF PROCEDURE:  The patient was brought directly to the operating room from preoperative holding and placed supine on the operating table.  General anesthesia was induced under invasive hemodynamic monitoring.  A transesophageal echo probe was  placed by the anesthesia team.  The chest, abdomen and legs were prepped with Betadine and draped as a sterile field.  A sternal incision was made as the saphenous vein was harvested endoscopically and then with an open technique from the right leg.   The left internal mammary artery was harvested as a pedicle graft from its origin at the subclavian vessels.  It was a 1.4 mm vessel with good flow.  The sternal retractor was placed, and the pericardium was opened and suspended.  The ascending aorta was  inspected and palpated.  It was mildly enlarged at 3.9 cm without significant calcification.  Pursestrings were placed in the ascending aorta and right atrium, and after the heparin was administered and the ACT was documented as being therapeutic, the  patient was cannulated and placed on cardiopulmonary bypass.  The coronaries were identified for grafting.  The proximal LAD, left main area, was heavily calcified.  The distal LAD, distal posterior descending, and OM1 vessels were adequate targets, but  small vessels.  The diagonal branch of the LAD was too small to graft.  Cardioplegic cannulas were placed both antegrade and retrograde cold blood cardioplegia.  The patient was cooled to 32 degrees.  The aortic crossclamp was applied.  A liter of cold  blood cardioplegia was delivered in split doses between the antegrade aortic  and retrograde coronary sinus catheters.  There was good cardioplegic arrest, and supple temperature dropped less than 12 degrees.   Cardioplegia was delivered every 20 minutes.  The distal coronary anastomoses were performed.  The first distal anastomosis was to the posterior descending branch of the right coronary.  This had a proximal 60-70% stenosis.  A reverse saphenous vein was sewn end-to-side with running 7-0 Prolene with  good flow through the graft.  Cardioplegia was redosed.  The second distal anastomosis was placed in the proximal OM1.  This was a small 1.3 mm vessel with proximal left main stenosis.  Reverse saphenous vein was sewn end-to-side with running 7-0 Prolene with good flow through the graft.  Cardioplegia was  redosed.  The third distal anastomosis was placed in the distal third of the LAD.  It had a proximal significant left main stenosis.  The left IMA was brought through an opening in the left lateral pericardium and was brought down onto the LAD and sewn end-to-side  with running 8-0 Prolene.  There was good flow through the anastomosis after briefly releasing the pedicle bulldog and the mammary artery. The bulldog was reapplied, and the pedicle was secured to the epicardium with 6-0 Prolenes.  Cardioplegia was  redosed.  While the crossclamp was still in place, 2 proximal vein anastomoses were performed on the ascending aorta using a 4.5 mm punch and running 6-0 Prolene.  Prior to tying down the final proximal anastomosis, air was vented from the coronaries with a dose  of retrograde warm blood cardioplegia.  The crossclamp was removed.  The heart was cardioverted back to a regular rhythm.  The vein grafts were deaired and opened and each had good flow.  Hemostasis was documented at the proximal and distal anastomoses.  The patient was rewarmed, reperfused, and temporary pacing wires  were applied.  The lungs were expanded and ventilator was resumed.  The patient was weaned from cardiopulmonary bypass without needing inotropic support.  Hemodynamics and cardiac output were normal.  Protamine was  administered without adverse reaction.  The cannula was removed.  The mediastinum was irrigated.  The  superior pericardial fat was closed over the aorta.  The anterior mediastinum and left pleural chest tubes were placed and brought out through separate incisions.  The sternum was closed with wire.  The patient remained stable.  The pectoralis fascia was  closed with a running #1 Vicryl.  The subcutaneous and skin layers were closed with running Vicryl.  The leg incisions were closed in the standard fashion.  The patient returned to the ICU in stable condition.  Total cardiopulmonary bypass time was 112 minutes.  LN/NUANCE  D:06/05/2019 T:06/05/2019 JOB:[id number]

## 2019-06-05 NOTE — Progress Notes (Signed)
Pre Procedure note for inpatients:   Derrick [name] has been scheduled for Procedure(s): CORONARY ARTERY BYPASS GRAFTING (CABG) (N/A) CLIPPING OF ATRIAL APPENDAGE (N/A) TRANSESOPHAGEAL ECHOCARDIOGRAM (TEE) (N/A) today. The various methods of treatment have been discussed with the patient. After consideration of the risks, benefits and treatment options the patient has consented to the planned procedure.   The patient has been seen and labs reviewed. There are no changes in the patient's condition to prevent proceeding with the planned procedure today.  Recent labs:  Lab Results  Component Value Date   WBC 9.6 06/04/2019   HGB 13.8 06/04/2019   HCT 41.5 06/04/2019   PLT 175 06/04/2019   GLUCOSE 103 (H) 06/04/2019   CHOL 154 05/29/2018   TRIG 52 05/29/2018   HDL 60 05/29/2018   LDLCALC 84 05/29/2018   ALT 17 06/04/2019   AST 20 06/04/2019   NA 139 06/04/2019   K 3.6 06/04/2019   CL 102 06/04/2019   CREATININE 0.97 06/04/2019   BUN 17 06/04/2019   CO2 27 06/04/2019   TSH 2.02 08/17/2018   PSA 3.14 10/07/2016   INR 1.1 06/04/2019   HGBA1C 4.9 06/04/2019    Derrick Poot III, MD 06/05/2019 7:28 AM

## 2019-06-05 NOTE — Transfer of Care (Signed)
Immediate Anesthesia Transfer of Care Note  Patient: Henley Boettner  Procedure(s) Performed: CORONARY ARTERY BYPASS GRAFTING (CABG) TIMES THREE USING LEFT INTERNAL MAMMARY ARTERY AND RIGHT GREATER SEPHANOUS VEIN (N/A Chest) TRANSESOPHAGEAL ECHOCARDIOGRAM (TEE) (N/A )  Patient Location: ICU  Anesthesia Type:General  Level of Consciousness: sedated and Patient remains intubated per anesthesia plan  Airway & Oxygen Therapy: Patient remains intubated per anesthesia plan and Patient placed on Ventilator (see vital sign flow sheet for setting)  Post-op Assessment: Report given to RN and Post -op Vital signs reviewed and stable  Post vital signs: Reviewed and stable  Last Vitals:  Vitals Value Taken Time  BP 89/50 06/05/2019  1:48 PM  Temp 36.6 C 06/05/2019  1:48 PM  Pulse 88 06/05/2019  1:48 PM  Resp 14 06/05/2019  1:48 PM  SpO2 100 % 06/05/2019  1:48 PM  Vitals shown include unvalidated device data.  Last Pain:  Vitals:   06/05/19 0617  TempSrc:   PainSc: 0-No pain      Patients Stated Pain Goal: 4 (00/71/21 9758)  Complications: No apparent anesthesia complications

## 2019-06-05 NOTE — Progress Notes (Signed)
Pt resting comfortably on home CPAP.  RT will continue to monitor.

## 2019-06-05 NOTE — Procedures (Signed)
Extubation Procedure Note  Patient Details:   Name: Derrick [name] DOB: Feb 11, 1941 MRN: [id number]   Airway Documentation:    Vent end date: 06/05/19 Vent end time: 1700   Evaluation  O2 sats: stable throughout Complications: No apparent complications Patient did tolerate procedure well. Bilateral Breath Sounds: Clear   Patient extubated to 4L [place]. NIF -30, VC-1.4L prior to extubation. Patient able to speak post extubation & IS performed 1250x5.  Kathie Dike 06/05/2019, 5:11 PM

## 2019-06-05 NOTE — Anesthesia Procedure Notes (Signed)
Central Venous Catheter Insertion Performed by: Annye Asa, MD, anesthesiologist Start/End6/08/2019 6:32 AM, 06/05/2019 6:44 AM Patient location: Pre-op. Preanesthetic checklist: patient identified, IV checked, risks and benefits discussed, surgical consent, monitors and equipment checked, pre-op evaluation, timeout performed and anesthesia consent Position: supine Lidocaine 1% used for infiltration and patient sedated Hand hygiene performed , maximum sterile barriers used  and Seldinger technique used Catheter size: 8.5 Fr PA cath was placed.Sheath introducer Swan type:thermodilution Procedure performed using ultrasound guided technique. Ultrasound Notes:anatomy identified, needle tip was noted to be adjacent to the nerve/plexus identified, no ultrasound evidence of intravascular and/or intraneural injection and image(s) printed for medical record Attempts: 1 Following insertion, dressing applied, Biopatch and line sutured. Post procedure assessment: blood return through all ports, free fluid flow and no air  Patient tolerated the procedure well with no immediate complications. Additional procedure comments: PA catheter:  Routine monitors. Timeout, sterile prep, drape, FBP R neck.  Supine position.  1% Lido local, finder and trocar RIJ 1st pass with US guidance.  Cordis placed over J wire. PA catheter in easily.  Sterile dressing applied.  Patient tolerated well, VSS.  Jenita Seashore, MD.

## 2019-06-06 ENCOUNTER — Inpatient Hospital Stay (HOSPITAL_COMMUNITY): Payer: Medicare Other

## 2019-06-06 ENCOUNTER — Encounter (HOSPITAL_COMMUNITY): Payer: Self-pay | Admitting: Cardiothoracic Surgery

## 2019-06-06 LAB — MAGNESIUM
Magnesium: 2.3 mg/dL (ref 1.7–2.4)
Magnesium: 2.3 mg/dL (ref 1.7–2.4)

## 2019-06-06 LAB — CBC
HCT: 31.5 % — ABNORMAL LOW (ref 39.0–52.0)
HCT: 29.5 % — ABNORMAL LOW (ref 39.0–52.0)
Hemoglobin: 10.6 g/dL — ABNORMAL LOW (ref 13.0–17.0)
Hemoglobin: 9.8 g/dL — ABNORMAL LOW (ref 13.0–17.0)
MCH: 31.3 pg (ref 26.0–34.0)
MCH: 31.3 pg (ref 26.0–34.0)
MCHC: 33.7 g/dL (ref 30.0–36.0)
MCHC: 33.2 g/dL (ref 30.0–36.0)
MCV: 92.9 fL (ref 80.0–100.0)
MCV: 94.2 fL (ref 80.0–100.0)
Platelets: 121 10*3/uL — ABNORMAL LOW (ref 150–400)
Platelets: 111 10*3/uL — ABNORMAL LOW (ref 150–400)
RBC: 3.39 MIL/uL — ABNORMAL LOW (ref 4.22–5.81)
RBC: 3.13 MIL/uL — ABNORMAL LOW (ref 4.22–5.81)
RDW: 13 % (ref 11.5–15.5)
RDW: 13.4 % (ref 11.5–15.5)
WBC: 11 10*3/uL — ABNORMAL HIGH (ref 4.0–10.5)
WBC: 9.9 10*3/uL (ref 4.0–10.5)
nRBC: 0 % (ref 0.0–0.2)
nRBC: 0 % (ref 0.0–0.2)

## 2019-06-06 LAB — BASIC METABOLIC PANEL
Anion gap: 10 (ref 5–15)
Anion gap: 8 (ref 5–15)
BUN: 10 mg/dL (ref 8–23)
BUN: 15 mg/dL (ref 8–23)
CO2: 24 mmol/L (ref 22–32)
CO2: 25 mmol/L (ref 22–32)
Calcium: 9.3 mg/dL (ref 8.9–10.3)
Calcium: 9.7 mg/dL (ref 8.9–10.3)
Chloride: 105 mmol/L (ref 98–111)
Chloride: 105 mmol/L (ref 98–111)
Creatinine, Ser: 0.78 mg/dL (ref 0.61–1.24)
Creatinine, Ser: 0.93 mg/dL (ref 0.61–1.24)
GFR calc Af Amer: >60 mL/min (ref >60)
GFR calc Af Amer: >60 mL/min (ref >60)
GFR calc non Af Amer: >60 mL/min (ref >60)
GFR calc non Af Amer: >60 mL/min (ref >60)
Glucose, Bld: 117 mg/dL — ABNORMAL HIGH (ref 70–99)
Glucose, Bld: 127 mg/dL — ABNORMAL HIGH (ref 70–99)
Potassium: 3.6 mmol/L (ref 3.5–5.1)
Potassium: 4.2 mmol/L (ref 3.5–5.1)
Sodium: 139 mmol/L (ref 135–145)
Sodium: 138 mmol/L (ref 135–145)

## 2019-06-06 LAB — GLUCOSE, CAPILLARY
Glucose-Capillary: 114 mg/dL — ABNORMAL HIGH (ref 70–99)
Glucose-Capillary: 122 mg/dL — ABNORMAL HIGH (ref 70–99)
Glucose-Capillary: 126 mg/dL — ABNORMAL HIGH (ref 70–99)

## 2019-06-06 MED ORDER — VANCOMYCIN HCL IN DEXTROSE 1-5 GM/200ML-% IV SOLN
1000.0000 mg | Freq: Once | INTRAVENOUS | Status: AC
  Administered 2019-06-06: 1000 mg via INTRAVENOUS
  Filled 2019-06-06: qty 200

## 2019-06-06 MED ORDER — ENOXAPARIN SODIUM 30 MG/0.3ML ~~LOC~~ SOLN
30.0000 mg | SUBCUTANEOUS | Status: DC
  Administered 2019-06-06 – 2019-06-10 (×5): 30 mg via SUBCUTANEOUS
  Filled 2019-06-06 (×5): qty 0.3

## 2019-06-06 MED ORDER — POTASSIUM CHLORIDE CRYS ER 20 MEQ PO TBCR
20.0000 meq | EXTENDED_RELEASE_TABLET | Freq: Two times a day (BID) | ORAL | Status: DC
  Administered 2019-06-06 – 2019-06-11 (×11): 20 meq via ORAL
  Filled 2019-06-06 (×11): qty 1

## 2019-06-06 MED ORDER — FUROSEMIDE 10 MG/ML IJ SOLN
20.0000 mg | Freq: Two times a day (BID) | INTRAMUSCULAR | Status: DC
  Administered 2019-06-06 (×2): 20 mg via INTRAVENOUS
  Filled 2019-06-06 (×2): qty 2

## 2019-06-06 MED ORDER — POTASSIUM CHLORIDE CRYS ER 20 MEQ PO TBCR: 20.0000 meq | EXTENDED_RELEASE_TABLET | Freq: Two times a day (BID) | ORAL | Status: DC

## 2019-06-06 MED ORDER — ALBUTEROL SULFATE (2.5 MG/3ML) 0.083% IN NEBU: 2.5000 mg | INHALATION_SOLUTION | Freq: Four times a day (QID) | RESPIRATORY_TRACT | Status: DC | PRN

## 2019-06-06 MED ORDER — INSULIN ASPART 100 UNIT/ML ~~LOC~~ SOLN
0.0000 [IU] | SUBCUTANEOUS | Status: DC
  Administered 2019-06-06 (×2): 2 [IU] via SUBCUTANEOUS

## 2019-06-06 MED ORDER — CHLORHEXIDINE GLUCONATE CLOTH 2 % EX PADS
6.0000 | MEDICATED_PAD | Freq: Every day | CUTANEOUS | Status: DC
  Administered 2019-06-06 – 2019-06-08 (×2): 6 via TOPICAL

## 2019-06-06 MED ORDER — POTASSIUM CHLORIDE 10 MEQ/50ML IV SOLN
10.0000 meq | INTRAVENOUS | Status: AC
  Administered 2019-06-06 (×3): 10 meq via INTRAVENOUS
  Filled 2019-06-06 (×3): qty 50

## 2019-06-06 MED FILL — Magnesium Sulfate Inj 50%: INTRAMUSCULAR | Qty: 10 | Status: AC

## 2019-06-06 MED FILL — Potassium Chloride Inj 2 mEq/ML: INTRAVENOUS | Qty: 40 | Status: AC

## 2019-06-06 MED FILL — Heparin Sodium (Porcine) Inj 1000 Unit/ML: INTRAMUSCULAR | Qty: 30 | Status: AC

## 2019-06-06 NOTE — Progress Notes (Signed)
      [address]       [place] [address]             [phone]      POD # 1 CABG x 3  Resting comfortably BP 98/65 (BP Location: Left Arm)   Pulse 89   Temp 98.4 F (36.9 C) (Oral)   Resp 20   Ht 5\' 9"  (1.753 m)   Wt 84.9 kg   SpO2 94%   BMI 27.62 kg/m   Intake/Output Summary (Last 24 hours) at 06/06/2019 1803 Last data filed at 06/06/2019 1600 Gross per 24 hour  Intake 2341.01 ml  Output 1263 ml  Net 1078.01 ml   K= 4.2, creatinine 0.93 Hct= 29  Doing well POD # 1  Derrick Monette C. Roxan Hockey, MD [facility] [phone]

## 2019-06-06 NOTE — Progress Notes (Addendum)
TCTS DAILY ICU PROGRESS NOTE                   [address] [place]            [name] [address]          [phone]   1 Day Post-Op Procedure(s) (LRB): CORONARY ARTERY BYPASS GRAFTING (CABG) TIMES THREE USING LEFT INTERNAL MAMMARY ARTERY AND RIGHT GREATER SEPHANOUS VEIN (N/A) TRANSESOPHAGEAL ECHOCARDIOGRAM (TEE) (N/A)  Total Length of Stay:  LOS: 1 day   Subjective: Feels pretty well, minor pain  Objective: Vital signs in last 24 hours: Temp:  [97.2 F (36.2 C)-99.7 F (37.6 C)] 98.8 F (37.1 C) (06/10 0809) Pulse Rate:  [86-90] 88 (06/10 0730) Cardiac Rhythm: A-V Sequential paced (06/10 0400) Resp:  [7-24] 18 (06/10 0730) BP: (83-125)/(50-86) 94/69 (06/10 0730) SpO2:  [92 %-100 %] 98 % (06/10 0807) Arterial Line BP: (88-127)/(37-72) 116/50 (06/10 0730) FiO2 (%):  [40 %-50 %] 40 % (06/09 1614) Weight:  [84.9 kg] 84.9 kg (06/10 0500)  Filed Weights   06/05/19 0617 06/06/19 0500  Weight: 82.9 kg 84.9 kg    Weight change: 1.933 kg   Hemodynamic parameters for last 24 hours: PAP: (13-32)/(6-24) 26/14 CO:  [2.9 L/min-6.1 L/min] 6 L/min CI:  [1.4 L/min/m2-3 L/min/m2] 3 L/min/m2  Intake/Output from previous day: 06/09 0701 - 06/10 0700 In: 4955.7 [I.V.:3213.7; Blood:520; IV TWSFKCLEX:5170] Out: 0174 [BSWHQ:7591; Blood:800; Chest Tube:340]  Intake/Output this shift: No intake/output data recorded.  Current Meds: Scheduled Meds: . acetaminophen  1,000 mg Oral Q6H   Or  . acetaminophen (TYLENOL) oral liquid 160 mg/5 mL  1,000 mg Per Tube Q6H  . albuterol  2.5 mg Nebulization QID  . aspirin EC  325 mg Oral Daily   Or  . aspirin  324 mg Per Tube Daily  . bisacodyl  10 mg Oral Daily   Or  . bisacodyl  10 mg Rectal Daily  . Chlorhexidine Gluconate Cloth  6 each Topical Q0600  . docusate sodium  200 mg Oral Daily  . dorzolamide-timolol  1 drop Both Eyes BID  . dronedarone  400 mg Oral BID WC  . insulin regular  0-10 Units Intravenous TID WC  .  latanoprost  1 drop Both Eyes QHS  . mouth rinse  15 mL Mouth Rinse BID  . metoprolol tartrate  12.5 mg Oral BID   Or  . metoprolol tartrate  12.5 mg Per Tube BID  . [START ON 06/07/2019] pantoprazole  40 mg Oral Daily  . sodium chloride flush  3 mL Intravenous Q12H   Continuous Infusions: . sodium chloride 20 mL/hr at 06/06/19 0700  . sodium chloride    . sodium chloride 20 mL/hr at 06/05/19 1340  . cefUROXime (ZINACEF)  IV Stopped (06/06/19 0542)  . dexmedetomidine (PRECEDEX) IV infusion Stopped (06/05/19 1501)  . DOBUTamine 3 mcg/kg/min (06/06/19 0700)  . insulin Stopped (06/05/19 1556)  . lactated ringers    . lactated ringers 20 mL/hr at 06/05/19 2157  . lactated ringers 20 mL/hr at 06/06/19 0700  . nitroGLYCERIN Stopped (06/05/19 1343)  . phenylephrine (NEO-SYNEPHRINE) Adult infusion 50 mcg/min (06/06/19 0700)  . potassium chloride     PRN Meds:.sodium chloride, lactated ringers, metoprolol tartrate, midazolam, morphine injection, ondansetron (ZOFRAN) IV, oxyCODONE, sodium chloride flush, traMADol  General appearance: alert, cooperative and no distress Heart: regular rate and rhythm and paced Lungs: fairly clear ant/lat, dim in bases Abdomen: soft, non-tender Extremities: mi edema Wound: dressings CDI  Lab Results: CBC: Recent Labs    06/05/19 1940 06/05/19 1955 06/06/19 0500  WBC 9.8  --  11.0*  HGB 11.3* 10.9* 10.6*  HCT 33.0* 32.0* 31.5*  PLT 119*  --  121*   BMET:  Recent Labs    06/04/19 0834  06/05/19 1955 06/06/19 0500  NA 139   < > 139 139  K 3.6   < > 3.8 3.6  CL 102  --  103 105  CO2 27  --   --  24  GLUCOSE 103*   < > 107* 117*  BUN 17  --  9 10  CREATININE 0.97   < > 0.60* 0.78  CALCIUM 10.1  --   --  9.3   < > = values in this interval not displayed.    CMET: Lab Results  Component Value Date   WBC 11.0 (H) 06/06/2019   HGB 10.6 (L) 06/06/2019   HCT 31.5 (L) 06/06/2019   PLT 121 (L) 06/06/2019   GLUCOSE 117 (H) 06/06/2019   CHOL  154 05/29/2018   TRIG 52 05/29/2018   HDL 60 05/29/2018   LDLCALC 84 05/29/2018   ALT 17 06/04/2019   AST 20 06/04/2019   NA 139 06/06/2019   K 3.6 06/06/2019   CL 105 06/06/2019   CREATININE 0.78 06/06/2019   BUN 10 06/06/2019   CO2 24 06/06/2019   TSH 2.02 08/17/2018   PSA 3.14 10/07/2016   INR 1.5 (H) 06/05/2019   HGBA1C 4.9 06/04/2019      PT/INR:  Recent Labs    06/05/19 1354  LABPROT 17.8*  INR 1.5*   Radiology: Dg Chest Port 1 View  Result Date: 06/05/2019 CLINICAL DATA:  Bypass surgery. EXAM: PORTABLE CHEST 1 VIEW COMPARISON:  Preoperative chest x-ray 06/04/2019 FINDINGS: The endotracheal tube is 6.5 cm above the carina. The NG tube is coursing down the esophagus into the stomach. The right IJ Swan-Ganz catheter tip is in the proximal right pulmonary artery. Left-sided chest tube in good position. No definite pneumothorax. Permanent pacer wires in good position without complicating features. The heart is borderline enlarged. No pulmonary edema. Small left pleural effusion and bibasilar atelectasis, left greater than right. IMPRESSION: 1. Support apparatus in good position without complicating features. 2. Small left pleural effusion and bibasilar atelectasis. No pulmonary edema or pneumothorax. Electronically Signed   By: Marijo Sanes M.D.   On: 06/05/2019 14:01     Assessment/Plan: S/P Procedure(s) (LRB): CORONARY ARTERY BYPASS GRAFTING (CABG) TIMES THREE USING LEFT INTERNAL MAMMARY ARTERY AND RIGHT GREATER SEPHANOUS VEIN (N/A) TRANSESOPHAGEAL ECHOCARDIOGRAM (TEE) (N/A)  1 overall doing well on POD#1 2 hemodyn stable on neo/dobut, BP running in 90's-low 100's, Cardiac indices and PAP are good. Wean dobut as able then neo 3 AV paced at 88 currently, intrinsic pacer is set at 60, transition over time 4 renal fxn in normal range, weight 2 kg over preop- good uop, cont gentle  diuresis for now 5 not much CT drainage, prob d/c tubes soon 6 CXR looks good, minor atx 7   ABL anemia- stable, not in transfusion threshold, monitor 8 reactive thrombocytopenia, monitor 9 very minor reactive leukocytosis, tmax 99,7- routine post op pulm toilet/mobilization 10 see progression orders   John Giovanni PA-C 06/06/2019 8:35 AM  Pager [phone]  Doing well after CABG for L main Mobilize,diuresis, remove lines patient examined and medical record reviewed,agree with above note. Tharon Aquas Trigt III 06/06/2019

## 2019-06-07 ENCOUNTER — Inpatient Hospital Stay (HOSPITAL_COMMUNITY): Payer: Medicare Other

## 2019-06-07 LAB — COOXEMETRY PANEL
Carboxyhemoglobin: 1.6 % — ABNORMAL HIGH (ref 0.5–1.5)
Methemoglobin: 0.8 % (ref 0.0–1.5)
O2 Saturation: 63.1 %
Total hemoglobin: 11.8 g/dL — ABNORMAL LOW (ref 12.0–16.0)

## 2019-06-07 LAB — CBC
HCT: 29.2 % — ABNORMAL LOW (ref 39.0–52.0)
Hemoglobin: 9.5 g/dL — ABNORMAL LOW (ref 13.0–17.0)
MCH: 30.9 pg (ref 26.0–34.0)
MCHC: 32.5 g/dL (ref 30.0–36.0)
MCV: 95.1 fL (ref 80.0–100.0)
Platelets: 105 10*3/uL — ABNORMAL LOW (ref 150–400)
RBC: 3.07 MIL/uL — ABNORMAL LOW (ref 4.22–5.81)
RDW: 13.3 % (ref 11.5–15.5)
WBC: 8 10*3/uL (ref 4.0–10.5)
nRBC: 0 % (ref 0.0–0.2)

## 2019-06-07 LAB — GLUCOSE, CAPILLARY
Glucose-Capillary: 108 mg/dL — ABNORMAL HIGH (ref 70–99)
Glucose-Capillary: 107 mg/dL — ABNORMAL HIGH (ref 70–99)
Glucose-Capillary: 130 mg/dL — ABNORMAL HIGH (ref 70–99)
Glucose-Capillary: 114 mg/dL — ABNORMAL HIGH (ref 70–99)
Glucose-Capillary: 105 mg/dL — ABNORMAL HIGH (ref 70–99)
Glucose-Capillary: 103 mg/dL — ABNORMAL HIGH (ref 70–99)

## 2019-06-07 LAB — BASIC METABOLIC PANEL
Anion gap: 7 (ref 5–15)
BUN: 16 mg/dL (ref 8–23)
CO2: 26 mmol/L (ref 22–32)
Calcium: 9.5 mg/dL (ref 8.9–10.3)
Chloride: 102 mmol/L (ref 98–111)
Creatinine, Ser: 0.91 mg/dL (ref 0.61–1.24)
GFR calc Af Amer: >60 mL/min (ref >60)
GFR calc non Af Amer: >60 mL/min (ref >60)
Glucose, Bld: 111 mg/dL — ABNORMAL HIGH (ref 70–99)
Potassium: 4.3 mmol/L (ref 3.5–5.1)
Sodium: 135 mmol/L (ref 135–145)

## 2019-06-07 MED ORDER — GUAIFENESIN ER 600 MG PO TB12
600.0000 mg | ORAL_TABLET | Freq: Two times a day (BID) | ORAL | Status: DC
  Administered 2019-06-07 – 2019-06-11 (×9): 600 mg via ORAL
  Filled 2019-06-07 (×9): qty 1

## 2019-06-07 MED ORDER — INSULIN ASPART 100 UNIT/ML ~~LOC~~ SOLN
0.0000 [IU] | Freq: Three times a day (TID) | SUBCUTANEOUS | Status: DC
  Administered 2019-06-07 – 2019-06-10 (×2): 2 [IU] via SUBCUTANEOUS

## 2019-06-07 MED ORDER — FINASTERIDE 5 MG PO TABS
5.0000 mg | ORAL_TABLET | Freq: Every day | ORAL | Status: DC
  Administered 2019-06-07 – 2019-06-11 (×5): 5 mg via ORAL
  Filled 2019-06-07 (×5): qty 1

## 2019-06-07 MED ORDER — TADALAFIL 5 MG PO TABS: 5.0000 mg | ORAL_TABLET | ORAL | Status: DC

## 2019-06-07 MED ORDER — ALUM & MAG HYDROXIDE-SIMETH 200-200-20 MG/5ML PO SUSP
15.0000 mL | Freq: Four times a day (QID) | ORAL | Status: DC | PRN
  Administered 2019-06-07 – 2019-06-11 (×9): 15 mL via ORAL
  Filled 2019-06-07 (×8): qty 30

## 2019-06-07 MED ORDER — FUROSEMIDE 10 MG/ML IJ SOLN
40.0000 mg | Freq: Two times a day (BID) | INTRAMUSCULAR | Status: DC
  Administered 2019-06-07 (×2): 40 mg via INTRAVENOUS
  Filled 2019-06-07 (×2): qty 4

## 2019-06-07 MED FILL — Albumin, Human Inj 5%: INTRAVENOUS | Qty: 250 | Status: AC

## 2019-06-07 MED FILL — Sodium Bicarbonate IV Soln 8.4%: INTRAVENOUS | Qty: 50 | Status: AC

## 2019-06-07 MED FILL — Mannitol IV Soln 20%: INTRAVENOUS | Qty: 500 | Status: AC

## 2019-06-07 MED FILL — Lidocaine HCl Local Soln Prefilled Syringe 100 MG/5ML (2%): INTRAMUSCULAR | Qty: 5 | Status: AC

## 2019-06-07 MED FILL — Electrolyte-R (PH 7.4) Solution: INTRAVENOUS | Qty: 5000 | Status: AC

## 2019-06-07 MED FILL — Heparin Sodium (Porcine) Inj 1000 Unit/ML: INTRAMUSCULAR | Qty: 10 | Status: AC

## 2019-06-07 MED FILL — Sodium Chloride IV Soln 0.9%: INTRAVENOUS | Qty: 3000 | Status: AC

## 2019-06-07 NOTE — Plan of Care (Signed)
  Problem: Clinical Measurements: Goal: Will remain free from infection Outcome: Progressing   Problem: Clinical Measurements: Goal: Cardiovascular complication will be avoided Outcome: Progressing   Problem: Pain Managment: Goal: General experience of comfort will improve Outcome: Progressing   

## 2019-06-07 NOTE — Progress Notes (Signed)
TCTS DAILY ICU PROGRESS NOTE                   [place]            [place] [address]          [phone]   2 Days Post-Op Procedure(s) (LRB): CORONARY ARTERY BYPASS GRAFTING (CABG) TIMES THREE USING LEFT INTERNAL MAMMARY ARTERY AND RIGHT GREATER SEPHANOUS VEIN (N/A) TRANSESOPHAGEAL ECHOCARDIOGRAM (TEE) (N/A)  Total Length of Stay:  LOS: 2 days   Subjective: Feels okay this morning. Not eating much yet. Pain well controlled.   Objective: Vital signs in last 24 hours: Temp:  [98.3 F (36.8 C)-98.7 F (37.1 C)] 98.7 F (37.1 C) (06/11 0757) Pulse Rate:  [79-89] 86 (06/11 1100) Cardiac Rhythm: A-V Sequential paced (06/11 0800) Resp:  [11-33] 13 (06/11 1100) BP: (89-133)/(57-80) 91/57 (06/11 1100) SpO2:  [91 %-98 %] 92 % (06/11 1100) Arterial Line BP: (108-135)/(43-61) 121/58 (06/10 1600) Weight:  [85.5 kg] 85.5 kg (06/11 0500)  Filed Weights   06/05/19 0617 06/06/19 0500 06/07/19 0500  Weight: 82.9 kg 84.9 kg 85.5 kg    Weight change: 0.649 kg      Intake/Output from previous day: 06/10 0701 - 06/11 0700 In: 1333.5 [P.O.:420; I.V.:653.3; IV Piggyback:260.1] Out: 1515 [Urine:1485; Chest Tube:30]  Intake/Output this shift: Total I/O In: 141.7 [P.O.:120; I.V.:21.7] Out: 485 [Urine:485]  Current Meds: Scheduled Meds: . acetaminophen  1,000 mg Oral Q6H   Or  . acetaminophen (TYLENOL) oral liquid 160 mg/5 mL  1,000 mg Per Tube Q6H  . aspirin EC  325 mg Oral Daily   Or  . aspirin  324 mg Per Tube Daily  . bisacodyl  10 mg Oral Daily   Or  . bisacodyl  10 mg Rectal Daily  . Chlorhexidine Gluconate Cloth  6 each Topical Q0600  . docusate sodium  200 mg Oral Daily  . dorzolamide-timolol  1 drop Both Eyes BID  . dronedarone  400 mg Oral BID WC  . enoxaparin (LOVENOX) injection  30 mg Subcutaneous Q24H  . finasteride  5 mg Oral Daily  . furosemide  40 mg Intravenous BID  . guaiFENesin  600 mg Oral BID  . insulin aspart  0-24 Units  Subcutaneous TID AC & HS  . latanoprost  1 drop Both Eyes QHS  . mouth rinse  15 mL Mouth Rinse BID  . metoprolol tartrate  12.5 mg Oral BID   Or  . metoprolol tartrate  12.5 mg Per Tube BID  . pantoprazole  40 mg Oral Daily  . potassium chloride  20 mEq Oral BID  . sodium chloride flush  3 mL Intravenous Q12H   Continuous Infusions: . sodium chloride Stopped (06/06/19 1441)  . sodium chloride    . sodium chloride 20 mL/hr at 06/05/19 1340  . lactated ringers Stopped (06/07/19 0844)   PRN Meds:.sodium chloride, albuterol, alum & mag hydroxide-simeth, metoprolol tartrate, midazolam, morphine injection, ondansetron (ZOFRAN) IV, oxyCODONE, sodium chloride flush, traMADol  General appearance: alert, cooperative and no distress Heart: paced at 80bpm Lungs: clear to auscultation bilaterally Abdomen: soft, non-tender; bowel sounds normal; no masses,  no organomegaly Extremities: extremities normal, atraumatic, no cyanosis or edema Wound: clean and dry dressed with a sterile dressing  Lab Results: CBC: Recent Labs    06/06/19 1601 06/07/19 0341  WBC 9.9 8.0  HGB 9.8* 9.5*  HCT 29.5* 29.2*  PLT 111* 105*   BMET:  Recent Labs  06/06/19 1601 06/07/19 0341  NA 138 135  K 4.2 4.3  CL 105 102  CO2 25 26  GLUCOSE 127* 111*  BUN 15 16  CREATININE 0.93 0.91  CALCIUM 9.7 9.5    CMET: Lab Results  Component Value Date   WBC 8.0 06/07/2019   HGB 9.5 (L) 06/07/2019   HCT 29.2 (L) 06/07/2019   PLT 105 (L) 06/07/2019   GLUCOSE 111 (H) 06/07/2019   CHOL 154 05/29/2018   TRIG 52 05/29/2018   HDL 60 05/29/2018   LDLCALC 84 05/29/2018   ALT 17 06/04/2019   AST 20 06/04/2019   NA 135 06/07/2019   K 4.3 06/07/2019   CL 102 06/07/2019   CREATININE 0.91 06/07/2019   BUN 16 06/07/2019   CO2 26 06/07/2019   TSH 2.02 08/17/2018   PSA 3.14 10/07/2016   INR 1.5 (H) 06/05/2019   HGBA1C 4.9 06/04/2019      PT/INR:  Recent Labs    06/05/19 1354  LABPROT 17.8*  INR 1.5*    Radiology: Dg Chest Port 1 View  Result Date: 06/07/2019 CLINICAL DATA:  Post CABG. EXAM: PORTABLE CHEST 1 VIEW COMPARISON:  06/06/2019 FINDINGS: Sternotomy wires and left-sided pacemaker unchanged. Right IJ central venous sheath with tip over the SVC. Lungs are hypoinflated demonstrate persistent mild left base opacification likely small effusion with atelectasis. Mild stable cardiomegaly. Remainder of the exam is unchanged. IMPRESSION: Hypoinflation with stable small left effusion with associated basilar atelectasis. Right IJ central venous sheath with tip over the SVC. Electronically Signed   By: Marin Olp M.D.   On: 06/07/2019 08:14     Assessment/Plan: S/P Procedure(s) (LRB): CORONARY ARTERY BYPASS GRAFTING (CABG) TIMES THREE USING LEFT INTERNAL MAMMARY ARTERY AND RIGHT GREATER SEPHANOUS VEIN (N/A) TRANSESOPHAGEAL ECHOCARDIOGRAM (TEE) (N/A)  1. overall doing well on POD#2 2. hemodyn stable on dobutamine, BP running in 90's-low 100's 3. CPAP while sleeping. Room air while awake. Chest tubes removed.  CXR this morning showed a small left pleural effusion.  4. Renal fxn in normal range, weight 2.5 kg over preop- good uop, continue Lasix 40mg  IV BID. Removing foley after lasix is given  5.  ABL anemia- stable, H and H 9.5/29.2 6. Reactive thrombocytopenia, monitor     Elgie Collard 06/07/2019 11:10 AM

## 2019-06-07 NOTE — Progress Notes (Signed)
Pt resting on home CPAP. RT will continue to monitor.

## 2019-06-07 NOTE — Progress Notes (Signed)
CT surgery p.m. Rounds postop day 2  CABG for left main disease  Patient walked in hallway Oxygen saturation 93% on room air AV paced Dobutamine weaned off Voiding after Foley catheter removed Stable day

## 2019-06-07 NOTE — Progress Notes (Signed)
Pt has his home CPAP unit.

## 2019-06-08 ENCOUNTER — Inpatient Hospital Stay (HOSPITAL_COMMUNITY): Payer: Medicare Other

## 2019-06-08 LAB — COMPREHENSIVE METABOLIC PANEL
ALT: 22 U/L (ref 0–44)
AST: 30 U/L (ref 15–41)
Albumin: 2.9 g/dL — ABNORMAL LOW (ref 3.5–5.0)
Alkaline Phosphatase: 41 U/L (ref 38–126)
Anion gap: 6 (ref 5–15)
BUN: 25 mg/dL — ABNORMAL HIGH (ref 8–23)
CO2: 29 mmol/L (ref 22–32)
Calcium: 9.3 mg/dL (ref 8.9–10.3)
Chloride: 102 mmol/L (ref 98–111)
Creatinine, Ser: 1.02 mg/dL (ref 0.61–1.24)
GFR calc Af Amer: >60 mL/min (ref >60)
GFR calc non Af Amer: >60 mL/min (ref >60)
Glucose, Bld: 107 mg/dL — ABNORMAL HIGH (ref 70–99)
Potassium: 3.8 mmol/L (ref 3.5–5.1)
Sodium: 137 mmol/L (ref 135–145)
Total Bilirubin: 0.8 mg/dL (ref 0.3–1.2)
Total Protein: 5.3 g/dL — ABNORMAL LOW (ref 6.5–8.1)

## 2019-06-08 LAB — COOXEMETRY PANEL
Carboxyhemoglobin: 1.6 % — ABNORMAL HIGH (ref 0.5–1.5)
Carboxyhemoglobin: 1.8 % — ABNORMAL HIGH (ref 0.5–1.5)
Methemoglobin: 0.8 % (ref 0.0–1.5)
Methemoglobin: 1 % (ref 0.0–1.5)
O2 Saturation: 54 %
O2 Saturation: 58.3 %
Total hemoglobin: 10.1 g/dL — ABNORMAL LOW (ref 12.0–16.0)
Total hemoglobin: 9.7 g/dL — ABNORMAL LOW (ref 12.0–16.0)

## 2019-06-08 LAB — CBC
HCT: 28.4 % — ABNORMAL LOW (ref 39.0–52.0)
Hemoglobin: 9.2 g/dL — ABNORMAL LOW (ref 13.0–17.0)
MCH: 31.2 pg (ref 26.0–34.0)
MCHC: 32.4 g/dL (ref 30.0–36.0)
MCV: 96.3 fL (ref 80.0–100.0)
Platelets: 123 10*3/uL — ABNORMAL LOW (ref 150–400)
RBC: 2.95 MIL/uL — ABNORMAL LOW (ref 4.22–5.81)
RDW: 13 % (ref 11.5–15.5)
WBC: 6.9 10*3/uL (ref 4.0–10.5)
nRBC: 0 % (ref 0.0–0.2)

## 2019-06-08 LAB — GLUCOSE, CAPILLARY
Glucose-Capillary: 120 mg/dL — ABNORMAL HIGH (ref 70–99)
Glucose-Capillary: 104 mg/dL — ABNORMAL HIGH (ref 70–99)
Glucose-Capillary: 114 mg/dL — ABNORMAL HIGH (ref 70–99)
Glucose-Capillary: 101 mg/dL — ABNORMAL HIGH (ref 70–99)
Glucose-Capillary: 106 mg/dL — ABNORMAL HIGH (ref 70–99)

## 2019-06-08 MED ORDER — ALBUMIN HUMAN 5 % IV SOLN
12.5000 g | Freq: Once | INTRAVENOUS | Status: AC
  Administered 2019-06-08: 12.5 g via INTRAVENOUS
  Filled 2019-06-08: qty 250

## 2019-06-08 MED ORDER — FUROSEMIDE 40 MG PO TABS
40.0000 mg | ORAL_TABLET | Freq: Every day | ORAL | Status: DC
  Administered 2019-06-08 – 2019-06-11 (×4): 40 mg via ORAL
  Filled 2019-06-08 (×4): qty 1

## 2019-06-08 MED ORDER — METOPROLOL TARTRATE 12.5 MG HALF TABLET
12.5000 mg | ORAL_TABLET | Freq: Two times a day (BID) | ORAL | Status: DC
  Administered 2019-06-08 – 2019-06-11 (×6): 12.5 mg via ORAL
  Filled 2019-06-08 (×6): qty 1

## 2019-06-08 MED ORDER — SORBITOL 70 % SOLN
15.0000 mL | Freq: Once | Status: DC
  Filled 2019-06-08: qty 30

## 2019-06-08 MED ORDER — SODIUM CHLORIDE 0.9 % IV SOLN
INTRAVENOUS | Status: DC | PRN
  Administered 2019-06-08: 250 mL via INTRAVENOUS

## 2019-06-08 MED ORDER — SORBITOL 70 % PO SOLN: 15.0000 mL | Freq: Once | ORAL | Status: DC

## 2019-06-08 MED ORDER — DOBUTAMINE IN D5W 4-5 MG/ML-% IV SOLN: 2.0000 ug/kg/min | INTRAVENOUS | Status: DC

## 2019-06-08 MED ORDER — DOBUTAMINE IN D5W 4-5 MG/ML-% IV SOLN
2.0000 ug/kg/min | INTRAVENOUS | Status: AC
  Administered 2019-06-08: 2 ug/kg/min via INTRAVENOUS
  Filled 2019-06-08: qty 250

## 2019-06-08 NOTE — Progress Notes (Signed)
RT Note:  Patient brought home CPAP unit to hospital.  Patient self manages his own machine.  Did let patient know that if he needs anything from respiratory to just ask.  Will continue to monitor.

## 2019-06-08 NOTE — Progress Notes (Signed)
EVENING ROUNDS NOTE :     [address]       [place] [address]             [phone]                 3 Days Post-Op Procedure(s) (LRB): CORONARY ARTERY BYPASS GRAFTING (CABG) TIMES THREE USING LEFT INTERNAL MAMMARY ARTERY AND RIGHT GREATER SEPHANOUS VEIN (N/A) TRANSESOPHAGEAL ECHOCARDIOGRAM (TEE) (N/A)  Total Length of Stay:  LOS: 3 days  BP 116/61   Pulse 74   Temp 99.1 F (37.3 C) (Oral)   Resp 17   Ht 5\' 9"  (1.753 m)   Wt 84.9 kg   SpO2 94%   BMI 27.64 kg/m   .Intake/Output      06/11 0701 - 06/12 0700 06/12 0701 - 06/13 0700   P.O. 480 480   I.V. (mL/kg) 24.7 (0.3) 69 (0.8)   IV Piggyback 71.7 120   Total Intake(mL/kg) 576.4 (6.8) 669 (7.9)   Urine (mL/kg/hr) 1390 (0.7) 250 (0.3)   Chest Tube     Total Output 1390 250   Net -813.6 +419          . sodium chloride 8 mL/hr at 06/08/19 1500  . DOBUTamine 2 mcg/kg/min (06/08/19 1500)  . lactated ringers Stopped (06/07/19 0844)     Lab Results  Component Value Date   WBC 6.9 06/08/2019   HGB 9.2 (L) 06/08/2019   HCT 28.4 (L) 06/08/2019   PLT 123 (L) 06/08/2019   GLUCOSE 107 (H) 06/08/2019   CHOL 154 05/29/2018   TRIG 52 05/29/2018   HDL 60 05/29/2018   LDLCALC 84 05/29/2018   ALT 22 06/08/2019   AST 30 06/08/2019   NA 137 06/08/2019   K 3.8 06/08/2019   CL 102 06/08/2019   CREATININE 1.02 06/08/2019   BUN 25 (H) 06/08/2019   CO2 29 06/08/2019   TSH 2.02 08/17/2018   PSA 3.14 10/07/2016   INR 1.5 (H) 06/05/2019   HGBA1C 4.9 06/04/2019   Low BP this am Now on dopamine 2.5 mg bp stable now av paced   Grace Isaac MD  Beeper [phone] Office [phone] 06/08/2019 4:07 PM

## 2019-06-08 NOTE — Progress Notes (Addendum)
TCTS DAILY ICU PROGRESS NOTE                   [place] [address]            [place] [address]          [phone]   3 Days Post-Op Procedure(s) (LRB): CORONARY ARTERY BYPASS GRAFTING (CABG) TIMES THREE USING LEFT INTERNAL MAMMARY ARTERY AND RIGHT GREATER SEPHANOUS VEIN (N/A) TRANSESOPHAGEAL ECHOCARDIOGRAM (TEE) (N/A)  Total Length of Stay:  LOS: 3 days   Subjective: Feeling better + hypotension Back on low dose dobutamine, getting albumin  Objective: Vital signs in last 24 hours: Temp:  [97.4 F (36.3 C)-98.9 F (37.2 C)] 97.9 F (36.6 C) (06/12 0734) Pulse Rate:  [79-87] 85 (06/12 0700) Cardiac Rhythm: A-V Sequential paced (06/12 0400) Resp:  [11-22] 11 (06/12 0700) BP: (86-110)/(55-80) 101/70 (06/12 0700) SpO2:  [91 %-96 %] 96 % (06/12 0700) Weight:  [84.9 kg] 84.9 kg (06/12 0456)  Filed Weights   06/06/19 0500 06/07/19 0500 06/08/19 0456  Weight: 84.9 kg 85.5 kg 84.9 kg    Weight change: -0.6 kg   Hemodynamic parameters for last 24 hours:    Intake/Output from previous day: 06/11 0701 - 06/12 0700 In: 513.9 [P.O.:480; I.V.:22.2; IV Piggyback:11.7] Out: 1390 [Urine:1390]  Intake/Output this shift: Total I/O In: -  Out: 50 [Urine:50]  Current Meds: Scheduled Meds: . acetaminophen  1,000 mg Oral Q6H   Or  . acetaminophen (TYLENOL) oral liquid 160 mg/5 mL  1,000 mg Per Tube Q6H  . aspirin EC  325 mg Oral Daily   Or  . aspirin  324 mg Per Tube Daily  . bisacodyl  10 mg Oral Daily   Or  . bisacodyl  10 mg Rectal Daily  . Chlorhexidine Gluconate Cloth  6 each Topical Q0600  . docusate sodium  200 mg Oral Daily  . dorzolamide-timolol  1 drop Both Eyes BID  . dronedarone  400 mg Oral BID WC  . enoxaparin (LOVENOX) injection  30 mg Subcutaneous Q24H  . finasteride  5 mg Oral Daily  . furosemide  40 mg Oral Daily  . guaiFENesin  600 mg Oral BID  . insulin aspart  0-24 Units Subcutaneous TID AC & HS  . latanoprost  1 drop Both Eyes QHS  .  mouth rinse  15 mL Mouth Rinse BID  . metoprolol tartrate  12.5 mg Oral BID  . pantoprazole  40 mg Oral Daily  . potassium chloride  20 mEq Oral BID  . sodium chloride flush  3 mL Intravenous Q12H   Continuous Infusions: . sodium chloride 250 mL (06/08/19 0853)  . DOBUTamine 2 mcg/kg/min (06/08/19 0600)  . lactated ringers Stopped (06/07/19 0844)   PRN Meds:.sodium chloride, albuterol, alum & mag hydroxide-simeth, ondansetron (ZOFRAN) IV, oxyCODONE, sodium chloride flush, traMADol  General appearance: alert, cooperative and no distress Heart: regular rate and rhythm and paced at 75 Lungs: mildly dim in bases Abdomen: benign Extremities: no significant edema Wound: incis healing well  Lab Results: CBC: Recent Labs    06/07/19 0341 06/08/19 0408  WBC 8.0 6.9  HGB 9.5* 9.2*  HCT 29.2* 28.4*  PLT 105* 123*   BMET:  Recent Labs    06/07/19 0341 06/08/19 0408  NA 135 137  K 4.3 3.8  CL 102 102  CO2 26 29  GLUCOSE 111* 107*  BUN 16 25*  CREATININE 0.91 1.02  CALCIUM 9.5 9.3    CMET: Lab Results  Component Value Date   WBC 6.9 06/08/2019   HGB 9.2 (L) 06/08/2019   HCT 28.4 (L) 06/08/2019   PLT 123 (L) 06/08/2019   GLUCOSE 107 (H) 06/08/2019   CHOL 154 05/29/2018   TRIG 52 05/29/2018   HDL 60 05/29/2018   LDLCALC 84 05/29/2018   ALT 22 06/08/2019   AST 30 06/08/2019   NA 137 06/08/2019   K 3.8 06/08/2019   CL 102 06/08/2019   CREATININE 1.02 06/08/2019   BUN 25 (H) 06/08/2019   CO2 29 06/08/2019   TSH 2.02 08/17/2018   PSA 3.14 10/07/2016   INR 1.5 (H) 06/05/2019   HGBA1C 4.9 06/04/2019      PT/INR:  Recent Labs    06/05/19 1354  LABPROT 17.8*  INR 1.5*   Radiology: Dg Chest Port 1 View  Result Date: 06/08/2019 CLINICAL DATA:  Status post coronary bypass graft EXAM: PORTABLE CHEST 1 VIEW COMPARISON:  06/07/2019 FINDINGS: Cardiac shadow is stable. Postsurgical changes are again seen as well as pacing device. Right jugular sheath is noted in  place. The lungs are well aerated bilaterally. Stable small left effusion is noted. Slight improved aeration in the left base is seen. No pneumothorax is noted. IMPRESSION: Small left effusion with improved aeration in the left base. Electronically Signed   By: Inez Catalina M.D.   On: 06/08/2019 07:00     Assessment/Plan: S/P Procedure(s) (LRB): CORONARY ARTERY BYPASS GRAFTING (CABG) TIMES THREE USING LEFT INTERNAL MAMMARY ARTERY AND RIGHT GREATER SEPHANOUS VEIN (N/A) TRANSESOPHAGEAL ECHOCARDIOGRAM (TEE) (N/A) POD#3 1 BP a little better on dobut/albumin- cont to monitor with vasodilatation effect , Co-Ox is 58 2 sats good on RA 3 pacer adjusted to 75 just now by rep 4 renal fxn in normal range, good uop, lasix 40 q day  ordered this am per MD 5 H/H pretty stable- cont to monitor ABL anemia 6 platelet trend improving 7 glucose well controlled 8 central line to be removed today 9 plan for tx to 2c    John Giovanni [facility] 06/08/2019 8:56 AM  Pager [phone]  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 History of atrial fibrillation on chronic Eliquis which will be resumed when the patient is discharged Permanent pacemaker reprogrammed to a rate of 75 and temporary pacer wires were rolled and taped Patient will continue low-dose dobutamine for borderline mixed venous saturation today Patient transferred to progressive care room for reconditioning and titration of meds with discharge plan for Monday.  patient examined and medical record reviewed,agree with above note. Tharon Aquas Trigt III 06/08/2019

## 2019-06-08 NOTE — Progress Notes (Signed)
During pt's morning ambulation, he started c/o feeling lightheaded. Pt was lowered to a chair and taken back to his room. Pt's BP was in the 60Q systolic with MAP of 70. MD was called and gave new order for a one time dose of Albumin and a continuous gtt of Dobutamine. Pt sat in chair in room for about an hour before requesting to get back in the bed. He now states he feels a lot better.

## 2019-06-09 ENCOUNTER — Inpatient Hospital Stay (HOSPITAL_COMMUNITY): Payer: Medicare Other

## 2019-06-09 LAB — GLUCOSE, CAPILLARY
Glucose-Capillary: 93 mg/dL (ref 70–99)
Glucose-Capillary: 83 mg/dL (ref 70–99)
Glucose-Capillary: 108 mg/dL — ABNORMAL HIGH (ref 70–99)
Glucose-Capillary: 99 mg/dL (ref 70–99)

## 2019-06-09 LAB — BASIC METABOLIC PANEL
Anion gap: 7 (ref 5–15)
BUN: 20 mg/dL (ref 8–23)
CO2: 28 mmol/L (ref 22–32)
Calcium: 9.3 mg/dL (ref 8.9–10.3)
Chloride: 101 mmol/L (ref 98–111)
Creatinine, Ser: 0.84 mg/dL (ref 0.61–1.24)
GFR calc Af Amer: >60 mL/min (ref >60)
GFR calc non Af Amer: >60 mL/min (ref >60)
Glucose, Bld: 106 mg/dL — ABNORMAL HIGH (ref 70–99)
Potassium: 3.9 mmol/L (ref 3.5–5.1)
Sodium: 136 mmol/L (ref 135–145)

## 2019-06-09 LAB — CBC
HCT: 26.8 % — ABNORMAL LOW (ref 39.0–52.0)
Hemoglobin: 8.8 g/dL — ABNORMAL LOW (ref 13.0–17.0)
MCH: 31 pg (ref 26.0–34.0)
MCHC: 32.8 g/dL (ref 30.0–36.0)
MCV: 94.4 fL (ref 80.0–100.0)
Platelets: 145 10*3/uL — ABNORMAL LOW (ref 150–400)
RBC: 2.84 MIL/uL — ABNORMAL LOW (ref 4.22–5.81)
RDW: 13 % (ref 11.5–15.5)
WBC: 5.1 10*3/uL (ref 4.0–10.5)
nRBC: 0 % (ref 0.0–0.2)

## 2019-06-09 NOTE — Progress Notes (Addendum)
      [address]       ,[place] [address]             [phone]      4 Days Post-Op Procedure(s) (LRB): CORONARY ARTERY BYPASS GRAFTING (CABG) TIMES THREE USING LEFT INTERNAL MAMMARY ARTERY AND RIGHT GREATER SEPHANOUS VEIN (N/A) TRANSESOPHAGEAL ECHOCARDIOGRAM (TEE) (N/A) Subjective: Feels good this morning. No complaints.   Objective: Vital signs in last 24 hours: Temp:  [97.5 F (36.4 C)-99.1 F (37.3 C)] 98.4 F (36.9 C) (06/13 0725) Pulse Rate:  [73-80] 80 (06/13 0725) Cardiac Rhythm: Atrial paced (06/13 0725) Resp:  [13-26] 15 (06/13 0309) BP: (90-118)/(56-79) 104/68 (06/13 0725) SpO2:  [89 %-97 %] 97 % (06/13 0725) Weight:  [85 kg] 85 kg (06/13 0500)     Intake/Output from previous day: 06/12 0701 - 06/13 0700 In: 1252.5 [P.O.:960; I.V.:172.5; IV Piggyback:120] Out: 924 [Urine:875] Intake/Output this shift: Total I/O In: 240 [P.O.:240] Out: 150 [Urine:150]  General appearance: alert, cooperative and no distress Heart: regular rate and rhythm, S1, S2 normal, no murmur, click, rub or gallop and paced rhythm Lungs: clear to auscultation bilaterally Abdomen: soft, non-tender; bowel sounds normal; no masses,  no organomegaly Extremities: extremities normal, atraumatic, no cyanosis or edema Wound: clean and dry. Some areas dressed but does not appear to be any drainage. Will leave open to air and monitor.  Lab Results: Recent Labs    06/08/19 0408 06/09/19 0219  WBC 6.9 5.1  HGB 9.2* 8.8*  HCT 28.4* 26.8*  PLT 123* 145*   BMET:  Recent Labs    06/08/19 0408 06/09/19 0219  NA 137 136  K 3.8 3.9  CL 102 101  CO2 29 28  GLUCOSE 107* 106*  BUN 25* 20  CREATININE 1.02 0.84  CALCIUM 9.3 9.3    PT/INR: No results for input(s): LABPROT, INR in the last 72 hours. ABG    Component Value Date/Time   PHART 7.277 (L) 06/05/2019 1806   HCO3 22.8 06/05/2019 1806   TCO2 23 06/05/2019 1955   ACIDBASEDEF 4.0 (H) 06/05/2019 1806   O2SAT  58.3 06/08/2019 0850   CBG (last 3)  Recent Labs    06/08/19 1610 06/08/19 2109 06/09/19 0610  GLUCAP 101* 106* 93    Assessment/Plan: S/P Procedure(s) (LRB): CORONARY ARTERY BYPASS GRAFTING (CABG) TIMES THREE USING LEFT INTERNAL MAMMARY ARTERY AND RIGHT GREATER SEPHANOUS VEIN (N/A) TRANSESOPHAGEAL ECHOCARDIOGRAM (TEE) (N/A)  1. CV-Hx of a. Fib on chronic Eliquis which will be resumed on d/c. AICD in place and PPM set to 75bpm. On dobutamine-will order a coox for today. 2. Pulm-Remains on room air with good oxygen saturation. Continue to encourage incentive spirometer. Small left pleural effusion on CXR. 3. Renal-creatinine 0.84, electrolytes okay 4. H and H 8.8/26.8, expected acute blood loss anemia.  5. Endo-blood glucose well controlled.  6. Encouraged ambulation.  7. On lovenox for DVT prophylaxis.   Plan: See what coox is this morning and decide about weaning or discontinuing dobutamine. Overall progressing well. Will plan for discharge Monday if remains stable.     LOS: 4 days    Elgie Collard 06/09/2019  In sinus , paced by internal pacer Pacing wires out tomorrow and poss home Monday Wean off dobutamine today  I have seen and examined Derrick [name] and agree with the above assessment  and plan.  Grace Isaac MD Beeper [phone] Office [phone] 06/09/2019 9:46 AM

## 2019-06-09 NOTE — Plan of Care (Signed)

## 2019-06-10 LAB — GLUCOSE, CAPILLARY
Glucose-Capillary: 93 mg/dL (ref 70–99)
Glucose-Capillary: 115 mg/dL — ABNORMAL HIGH (ref 70–99)
Glucose-Capillary: 88 mg/dL (ref 70–99)
Glucose-Capillary: 127 mg/dL — ABNORMAL HIGH (ref 70–99)

## 2019-06-10 MED ORDER — ATORVASTATIN CALCIUM 10 MG PO TABS
20.0000 mg | ORAL_TABLET | Freq: Every day | ORAL | Status: DC
  Administered 2019-06-10: 20 mg via ORAL
  Filled 2019-06-10: qty 2

## 2019-06-10 NOTE — Progress Notes (Signed)
Pacing wires discontinued per protocol.  Patient tolerated well.  No complications.

## 2019-06-10 NOTE — Progress Notes (Signed)
Patient ID: Derrick [name], male   DOB: 1941-06-18, 78 y.o.   MRN: [id number] [facility] DAILY ICU PROGRESS NOTE                   [address] [place]            [name] [address]          [phone]   5 Days Post-Op Procedure(s) (LRB): CORONARY ARTERY BYPASS GRAFTING (CABG) TIMES THREE USING LEFT INTERNAL MAMMARY ARTERY AND RIGHT GREATER SEPHANOUS VEIN (N/A) TRANSESOPHAGEAL ECHOCARDIOGRAM (TEE) (N/A)  Total Length of Stay:  LOS: 5 days   Subjective: Patient awake alert ambulating around in the unit this morning without difficulty  Objective: Vital signs in last 24 hours: Temp:  [98.3 F (36.8 C)-99.2 F (37.3 C)] 98.7 F (37.1 C) (06/14 0702) Pulse Rate:  [74-77] 75 (06/14 0300) Cardiac Rhythm: A-V Sequential paced (06/14 0800) Resp:  [15-19] 15 (06/14 0300) BP: (107-121)/(61-67) 118/67 (06/14 0702) SpO2:  [97 %-99 %] 98 % (06/14 0300) Weight:  [85.3 kg] 85.3 kg (06/14 0500)  Filed Weights   06/08/19 0456 06/09/19 0500 06/10/19 0500  Weight: 84.9 kg 85 kg 85.3 kg    Weight change: 0.3 kg   Hemodynamic parameters for last 24 hours:    Intake/Output from previous day: 06/13 0701 - 06/14 0700 In: 480 [P.O.:480] Out: 1300 [Urine:1300]  Intake/Output this shift: Total I/O In: 240 [P.O.:240] Out: 200 [Urine:200]  Current Meds: Scheduled Meds: . acetaminophen  1,000 mg Oral Q6H   Or  . acetaminophen (TYLENOL) oral liquid 160 mg/5 mL  1,000 mg Per Tube Q6H  . aspirin EC  325 mg Oral Daily   Or  . aspirin  324 mg Per Tube Daily  . bisacodyl  10 mg Oral Daily   Or  . bisacodyl  10 mg Rectal Daily  . docusate sodium  200 mg Oral Daily  . dorzolamide-timolol  1 drop Both Eyes BID  . dronedarone  400 mg Oral BID WC  . enoxaparin (LOVENOX) injection  30 mg Subcutaneous Q24H  . finasteride  5 mg Oral Daily  . furosemide  40 mg Oral Daily  . guaiFENesin  600 mg Oral BID  . insulin aspart  0-24 Units Subcutaneous TID AC & HS  . latanoprost  1 drop Both Eyes  QHS  . metoprolol tartrate  12.5 mg Oral BID  . pantoprazole  40 mg Oral Daily  . potassium chloride  20 mEq Oral BID  . sodium chloride flush  3 mL Intravenous Q12H  . sorbitol  15 mL Oral Once   Continuous Infusions: . sodium chloride Stopped (06/09/19 0010)  . lactated ringers Stopped (06/07/19 0844)   PRN Meds:.sodium chloride, albuterol, alum & mag hydroxide-simeth, ondansetron (ZOFRAN) IV, oxyCODONE, sodium chloride flush, traMADol  General appearance: alert, cooperative and no distress Neurologic: intact Heart: regular rate and rhythm, S1, S2 normal, no murmur, click, rub or gallop and Paced rhythm Lungs: clear to auscultation bilaterally Abdomen: soft, non-tender; bowel sounds normal; no masses,  no organomegaly Extremities: extremities normal, atraumatic, no cyanosis or edema and Homans sign is negative, no sign of DVT Wound: Sternum stable healing well  Lab Results: CBC: Recent Labs    06/08/19 0408 06/09/19 0219  WBC 6.9 5.1  HGB 9.2* 8.8*  HCT 28.4* 26.8*  PLT 123* 145*   BMET:  Recent Labs    06/08/19 0408 06/09/19 0219  NA 137 136  K 3.8 3.9  CL 102 101  CO2 29 28  GLUCOSE 107* 106*  BUN 25* 20  CREATININE 1.02 0.84  CALCIUM 9.3 9.3    CMET: Lab Results  Component Value Date   WBC 5.1 06/09/2019   HGB 8.8 (L) 06/09/2019   HCT 26.8 (L) 06/09/2019   PLT 145 (L) 06/09/2019   GLUCOSE 106 (H) 06/09/2019   CHOL 154 05/29/2018   TRIG 52 05/29/2018   HDL 60 05/29/2018   LDLCALC 84 05/29/2018   ALT 22 06/08/2019   AST 30 06/08/2019   NA 136 06/09/2019   K 3.9 06/09/2019   CL 101 06/09/2019   CREATININE 0.84 06/09/2019   BUN 20 06/09/2019   CO2 28 06/09/2019   TSH 2.02 08/17/2018   PSA 3.14 10/07/2016   INR 1.5 (H) 06/05/2019   HGBA1C 4.9 06/04/2019      PT/INR: No results for input(s): LABPROT, INR in the last 72 hours. Radiology: No results found.   Assessment/Plan: S/P Procedure(s) (LRB): CORONARY ARTERY BYPASS GRAFTING (CABG)  TIMES THREE USING LEFT INTERNAL MAMMARY ARTERY AND RIGHT GREATER SEPHANOUS VEIN (N/A) TRANSESOPHAGEAL ECHOCARDIOGRAM (TEE) (N/A) Mobilize d/c pacing wires DC dobutamine Possible home tomorrow    Grace Isaac 06/10/2019 11:19 AM

## 2019-06-10 NOTE — Plan of Care (Signed)

## 2019-06-10 NOTE — Discharge Summary (Addendum)
Physician Discharge Summary  Patient ID: Ebb Carelock MRN: [id number] DOB/AGE: 76-19-1942 76 y.o.  Admit date: 06/05/2019 Discharge date: 06/11/2019  Admission Diagnoses: Patient Active Problem List   Diagnosis Date Noted  . LLQ pain 11/10/2018  . Chronic fatigue 08/16/2018  . Allergic response, initial encounter 06/01/2018  . Tick bite 06/01/2018  . 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 MCI (mild cognitive impairment) 01/26/2018  . Pacemaker ECG pattern 11/30/2017  . Bradycardia, sinus 11/30/2017  . Ventricular tachycardia (paroxysmal) ([place]) 11/30/2017  . Monocytosis 06/10/2017  . Prediabetes 03/29/2017  . S/P placement of cardiac pacemaker 08/03/2016  . AF (atrial fibrillation) ([place]) 05/12/2016  . Bradycardia 02/09/2016  . DISH (diffuse idiopathic skeletal hyperostosis) 12/02/2015  . Abnormal stress electrocardiogram test using treadmill   . Coronary artery disease involving coronary bypass graft of native heart without angina pectoris 03/26/2015  . 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 OSA on CPAP 10/16/2014  . 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 Essential hypertension, benign 05/07/2014  . Routine health maintenance 12/16/2013  . Neuropathy associated with MGUS ([place]) 08/08/2013  . 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 Allergic rhinitis 02/21/2012  . Coronary artery disease involving native coronary artery of native heart with angina pectoris ([place]) 06/22/2011  . Malignant basal cell neoplasm of skin 06/16/2011  . 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 Malignant neoplasm of descending colon ([place]) 05/28/2010  . Vitamin D deficiency 05/28/2010  . Hyperlipidemia with target LDL less than 100 05/28/2010  . Asthma exacerbation 05/28/2010   . GERD 05/28/2010  . BPH associated with nocturia 05/28/2010  . Osteoarthritis of both hands 05/28/2010    Discharge Diagnoses:   Patient Active Problem List   Diagnosis Date Noted  . S/P CABG x 3 06/05/2019  . LLQ pain 11/10/2018  . Chronic fatigue 08/16/2018  . Allergic response, initial encounter 06/01/2018  . Tick bite 06/01/2018  . 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 MCI (mild cognitive impairment) 01/26/2018  . Pacemaker ECG pattern 11/30/2017  . Bradycardia, sinus 11/30/2017  . Ventricular tachycardia (paroxysmal) ([place]) 11/30/2017  . Monocytosis 06/10/2017  . Prediabetes 03/29/2017  . S/P placement of cardiac pacemaker 08/03/2016  . AF (atrial fibrillation) ([place]) 05/12/2016  . Bradycardia 02/09/2016  . DISH (diffuse idiopathic skeletal hyperostosis) 12/02/2015  . Abnormal stress electrocardiogram test using treadmill   . Coronary artery disease involving coronary bypass graft of native heart without angina pectoris 03/26/2015  . 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 OSA on CPAP 10/16/2014  . 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 Essential hypertension, benign 05/07/2014  . Routine health maintenance 12/16/2013  . Neuropathy associated with MGUS ([place]) 08/08/2013  . 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 Allergic rhinitis 02/21/2012  . Coronary artery disease involving native coronary artery of native heart with angina pectoris ([place]) 06/22/2011  . Malignant basal cell neoplasm of skin 06/16/2011  . 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 Malignant neoplasm of descending colon ([place]) 05/28/2010  . Vitamin D deficiency 05/28/2010  . Hyperlipidemia with target LDL less than 100 05/28/2010  . Asthma exacerbation 05/28/2010  . GERD  05/28/2010  . BPH associated with nocturia 05/28/2010  . Osteoarthritis of both hands 05/28/2010  Discharged Condition: good  HPI:   78 year old nondiabetic non-smoker with history of CAD status post DMI[mild] 2012 treated with PCI of RCA.  He took Plavix for a year.  He now has symptoms of exertional angina relieved by rest.  Cardiac catheterization by Dr. Burt Knack demonstrates a 75% hazy left main stenosis with a patent RCA stent but with 60 to 70% distal stenosis.  Echo shows normal LV function.  He has a mildly calcified aortic valve without significant aortic stenosis or insufficiency.  RV function appears normal. The patient has paroxysmal atrial fibrillation with past  problems with rate control on meds so he had a pacemaker placed in 2017.  He is predominantly in sinus rhythm and is on Eliquis.  Patient has history of pulmonary sarcoidosis.  Diagnosis made with right lung biopsy in 2012 in [place].  He has intermittent problems with wheezing productive cough treated with as needed antibiotics and prednisone taper.  He does not have a pulmonary physician in [place].  Last CT of chest was 2012.  Patient also has history of mono gammopathy, left hemicolectomy for colon cancer remotely and peripheral neuropathy.  He recently had an inguinal hernia repair in February 2020 without anesthesia or bleeding complications.   Hospital Course:   On 06/05/2019 Mr. Toy Cookey underwent a coronary office grafting x3 by Dr. [name].  He tolerated the procedure well and was transferred to the surgical ICU for continued care.  He was extubated time manner.  Postop day 1 he was doing quite well with some mild pain.  He remained on Neo-Synephrine and dobutamine.  He remained AV paced at 88 bpm.  His intrinsic pacemaker was set at 60.  We removed all his lines but kept his chest tubes due to output.  His chest x-ray was stable with some mild atelectasis.  Postop day 2 he continued to progress.  He remained  hemodynamically stable on dobutamine.  He required CPAP while sleeping but was on room air while awake.  His renal function remained stable and we continued his diuretic regimen for fluid overload.  Postop day 3 he did have some hypotension.  He remained on dobutamine with a co-ox of 58.  We reprogrammed his internal pacemaker to a rate of 75 and his temporary pacemaker wires were rolled and taped to the patient's chest.  Postop day 4 he remained stable.  He did have a small left pleural effusion on chest x-ray.  He remained on Lovenox for DVT prophylaxis.  We will need to resume his Eliquis for his history of chronic atrial fibrillation at discharge.  Postop day 5 we discontinued his pacing wires and we discontinued his dobutamine.  The patient was ambulating with limited assistance and his incisions were healing well.  He is stable for discharge today.  Consults: None  Significant Diagnostic Studies:   CLINICAL DATA:  CABG.  EXAM: PORTABLE CHEST 1 VIEW  COMPARISON:  06/08/2019  FINDINGS: Sequelae of CABG are again identified. A dual lead pacemaker remains in place. Right jugular sheath has been removed. The cardiac silhouette remains mildly enlarged. Lung volumes remain low with a persistent small left pleural effusion and mild left basilar lung opacity likely reflecting atelectasis. No pneumothorax is identified.  IMPRESSION: Unchanged small left pleural effusion and left basilar atelectasis.   Electronically Signed   By: Logan Bores M.D.   On: 06/09/2019 10:45   Treatments:   NAME: [place] RECORD [id number] ACCOUNT [id number] DATE OF BIRTH:1941/12/27 FACILITY: [facility] LOCATION: [facility]  PHYSICIAN:Tobias Avitabile VAN TRIGT III, MD  OPERATIVE REPORT  DATE OF PROCEDURE:  06/05/2019  OPERATION: 1.  Coronary artery bypass grafting x3 (left internal mammary artery to left anterior descending, saphenous vein graft to obtuse marginal 1, saphenous vein graft  to posterior descending). 2.  Endoscopic harvest and open vein harvest of right leg greater saphenous vein.  SURGEON:  Tharon Aquas Trigt III, MD  ASSISTANT:  Jadene Pierini, PA-C  PREOPERATIVE DIAGNOSES:  Severe distal left main stenosis, previous percutaneous coronary intervention of the right coronary artery.  POSTOPERATIVE DIAGNOSES:  Severe distal left main stenosis, previous percutaneous coronary intervention of the right coronary artery.  ANESTHESIA:  General by Laurie Panda, MD.  CLINICAL PROCEDURE:  After informed consent had been documented in the preop holding and final questions addressed, the patient was brought to the operating room.  I had previously seen the patient in the office in consultation on at least 2 occasions,  and after reviewing his coronary angiogram and echocardiogram, I discussed the details of CABG with the patient, including the expected benefits and risks.  He understood the major aspects of the surgery including the location of the surgical incisions,  the use of general anesthesia and cardiopulmonary bypass, and the expected postoperative recovery.  He understood the potential risks of surgery to include stroke, bleeding, infection, organ failure, postoperative arrhythmia, postoperative pulmonary  problems and death.  He agreed to proceed with surgery under what I felt was an informed consent.  OPERATIVE FINDINGS: 1.  Small native saphenous vein with difficulty using the endoscopic technique which necessitated open vein harvest from the right thigh.  The remainder of the vein was too small to use. 2.  Small native coronaries, but graftable. 3.  No blood products required. 4.  Preserved left ventricular function on echocardiogram after separation from cardiopulmonary bypass  Discharge Exam: Blood pressure 118/72, pulse 75, temperature 98.6 F (37 C), temperature source Oral, resp. rate 13, height 5\' 9"  (1.753 m), weight 84.1 kg, SpO2 98 %.   General  appearance: alert, cooperative and no distress Heart: regular rate and rhythm and paced Lungs: clear to auscultation bilaterally Abdomen: soft, non-tender; bowel sounds normal; no masses,  no organomegaly Extremities: edema trace Wound: clean and dry, eccymosis of BLE EVH sites   Discharge disposition: 01-Home or Self Care   Allergies as of 06/11/2019   No Known Allergies     Medication List    STOP taking these medications   amLODipine 5 MG tablet Commonly known as: NORVASC     TAKE these medications   albuterol 108 (90 Base) MCG/ACT inhaler Commonly known as: VENTOLIN HFA Inhale 2 puffs into the lungs every 6 (six) hours as needed for wheezing or shortness of breath.   apixaban 5 MG Tabs tablet Commonly known as: Eliquis Take 1 tablet (5 mg total) by mouth 2 (two) times daily.   aspirin EC 81 MG tablet Take 1 tablet (81 mg total) by mouth daily.   atorvastatin 20 MG tablet Commonly known as: LIPITOR Take 1 tablet (20 mg total) by mouth daily at 6 PM.   bimatoprost 0.01 % Soln Commonly known as: LUMIGAN Place 1 drop into both eyes at bedtime.   Cholecalciferol 25 MCG (1000 UT) tablet Take 1,000 Units by mouth daily.   dorzolamide-timolol 22.3-6.8 MG/ML ophthalmic solution Commonly known as: COSOPT Place 1 drop into both eyes 2 (two) times daily.   finasteride 5 MG tablet Commonly known as: PROSCAR Take 5 mg by mouth daily.  fluticasone 50 MCG/ACT nasal spray Commonly known as: FLONASE Place 2 sprays into both nostrils 2 (two) times daily.   Fluticasone Propionate (Inhal) 100 MCG/BLIST Aepb Commonly known as: Flovent Diskus Inhale 1 Inhaler into the lungs 2 (two) times daily. What changed:   how much to take  when to take this  reasons to take this   metoprolol tartrate 25 MG tablet Commonly known as: LOPRESSOR Take 0.5 tablets (12.5 mg total) by mouth 2 (two) times daily. What changed:   medication strength  how much to take   Multaq 400 MG  tablet Generic drug: dronedarone TAKE 1 TABLET BY MOUTH TWICE DAILY WITH MEALS What changed:   how much to take  when to take this   multivitamin with minerals Tabs tablet Take 1 tablet by mouth daily.   pantoprazole 40 MG tablet Commonly known as: PROTONIX TAKE 1 TABLET BY MOUTH ONCE DAILY   SYSTANE OP Place 1 drop into both eyes daily as needed (dry eyes).   tadalafil 5 MG tablet Commonly known as: CIALIS Take 5 mg by mouth at bedtime.   traMADol 50 MG tablet Commonly known as: ULTRAM Take 1-2 tablets (50-100 mg total) by mouth every 4 (four) hours as needed for moderate pain.   triamterene-hydrochlorothiazide 37.5-25 MG tablet Commonly known as: MAXZIDE-25 Take 1 tablet by mouth once daily      Follow-up Information    Bedsole, Amy E, MD. Call in 1 day(s).   Specialty: Family Medicine Contact information: [place] [address] [phone]        [name], MD .   Specialty: Cardiology Contact information: [address] [place] [address] [phone]        [name], MD .   Specialty: Cardiology Contact information: [address] [place] [address] [phone]          The patient has been discharged on:   1.Beta Blocker:  Yes [ Y  ]                              No   [   ]                              If No, reason:  2.Ace Inhibitor/ARB: Yes [   ]                                     No  [  no  ]                                     If No, reason: Labile BP 3.Statin:   Yes [ yes  ]                  No  [   ]                  If No, reason:  4.Ecasa:  Yes  [ yes  ]                  No   [   ]  If No, reason:    Signed:  Original Note by Nicholes Rough PA-C   Updated by   Ellwood Handler, PA-C  06/11/2019, 7:47 AM   patient examined and medical record reviewed,agree with above note. Tharon Aquas Trigt III 06/13/2019

## 2019-06-11 LAB — GLUCOSE, CAPILLARY: Glucose-Capillary: 97 mg/dL (ref 70–99)

## 2019-06-11 MED ORDER — METOPROLOL TARTRATE 25 MG PO TABS: 12.5000 mg | ORAL_TABLET | Freq: Two times a day (BID) | ORAL | 3 refills | Status: DC

## 2019-06-11 MED ORDER — ASPIRIN EC 81 MG PO TBEC: 81.0000 mg | DELAYED_RELEASE_TABLET | Freq: Every day | ORAL | 2 refills | Status: AC

## 2019-06-11 MED ORDER — TRAMADOL HCL 50 MG PO TABS: 50.0000 mg | ORAL_TABLET | ORAL | 0 refills | Status: DC | PRN

## 2019-06-11 NOTE — Plan of Care (Signed)
  Problem: Education: Goal: Knowledge of General Education information will improve Description: Including pain rating scale, medication(s)/side effects and non-pharmacologic comfort measures Outcome: Completed/Met   Problem: Health Behavior/Discharge Planning: Goal: Ability to manage health-related needs will improve Outcome: Completed/Met   Problem: Clinical Measurements: Goal: Ability to maintain clinical measurements within normal limits will improve Outcome: Completed/Met Goal: Will remain free from infection Outcome: Completed/Met Goal: Diagnostic test results will improve Outcome: Completed/Met Goal: Respiratory complications will improve Outcome: Completed/Met Goal: Cardiovascular complication will be avoided Outcome: Completed/Met   Problem: Activity: Goal: Risk for activity intolerance will decrease Outcome: Completed/Met   Problem: Nutrition: Goal: Adequate nutrition will be maintained Outcome: Completed/Met   Problem: Coping: Goal: Level of anxiety will decrease Outcome: Completed/Met   Problem: Elimination: Goal: Will not experience complications related to bowel motility Outcome: Completed/Met Goal: Will not experience complications related to urinary retention Outcome: Completed/Met   Problem: Pain Managment: Goal: General experience of comfort will improve Outcome: Completed/Met   Problem: Safety: Goal: Ability to remain free from injury will improve Outcome: Completed/Met   Problem: Skin Integrity: Goal: Risk for impaired skin integrity will decrease Outcome: Completed/Met   Problem: Education: Goal: Will demonstrate proper wound care and an understanding of methods to prevent future damage Outcome: Completed/Met Goal: Knowledge of disease or condition will improve Outcome: Completed/Met Goal: Knowledge of the prescribed therapeutic regimen will improve Outcome: Completed/Met Goal: Individualized Educational Video(s) Outcome: Completed/Met    Problem: Activity: Goal: Risk for activity intolerance will decrease Outcome: Completed/Met   Problem: Cardiac: Goal: Will achieve and/or maintain hemodynamic stability Outcome: Completed/Met   Problem: Clinical Measurements: Goal: Postoperative complications will be avoided or minimized Outcome: Completed/Met   Problem: Respiratory: Goal: Respiratory status will improve Outcome: Completed/Met   Problem: Skin Integrity: Goal: Wound healing without signs and symptoms of infection Outcome: Completed/Met Goal: Risk for impaired skin integrity will decrease Outcome: Completed/Met   Problem: Urinary Elimination: Goal: Ability to achieve and maintain adequate renal perfusion and functioning will improve Outcome: Completed/Met   

## 2019-06-11 NOTE — Progress Notes (Addendum)
      [address]       Derrick [name], [id number]             [phone]      6 Days Post-Op Procedure(s) (LRB): CORONARY ARTERY BYPASS GRAFTING (CABG) TIMES THREE USING LEFT INTERNAL MAMMARY ARTERY AND RIGHT GREATER SEPHANOUS VEIN (N/A) TRANSESOPHAGEAL ECHOCARDIOGRAM (TEE) (N/A)   Subjective:  No new complaints.  Denies chest pain and shortness of breath.  Ambulating without difficulty + BM... He wishes to go home today.  Objective: Vital signs in last 24 hours: Temp:  [98.6 F (37 C)-99.2 F (37.3 C)] 98.6 F (37 C) (06/15 0313) Pulse Rate:  [74-75] 75 (06/15 0313) Cardiac Rhythm: A-V Sequential paced;Bundle branch block (06/15 0701) Resp:  [12-17] 13 (06/15 0313) BP: (104-118)/(58-72) 118/72 (06/15 0313) SpO2:  [98 %-100 %] 98 % (06/15 0313) Weight:  [84.1 kg] 84.1 kg (06/15 0313)  Intake/Output from previous day: 06/14 0701 - 06/15 0700 In: 942 [P.O.:942] Out: 1505 [Urine:1505]  General appearance: alert, cooperative and no distress Heart: regular rate and rhythm and paced Lungs: clear to auscultation bilaterally Abdomen: soft, non-tender; bowel sounds normal; no masses,  no organomegaly Extremities: edema trace Wound: clean and dry, eccymosis of BLE EVH sites  Lab Results: Recent Labs    06/09/19 0219  WBC 5.1  HGB 8.8*  HCT 26.8*  PLT 145*   BMET:  Recent Labs    06/09/19 0219  NA 136  K 3.9  CL 101  CO2 28  GLUCOSE 106*  BUN 20  CREATININE 0.84  CALCIUM 9.3    PT/INR: No results for input(s): LABPROT, INR in the last 72 hours. ABG    Component Value Date/Time   PHART 7.277 (L) 06/05/2019 1806   HCO3 22.8 06/05/2019 1806   TCO2 23 06/05/2019 1955   ACIDBASEDEF 4.0 (H) 06/05/2019 1806   O2SAT 58.3 06/08/2019 0850   CBG (last 3)  Recent Labs    06/10/19 1620 06/10/19 2139 06/11/19 0606  GLUCAP 88 127* 97    Assessment/Plan: S/P Procedure(s) (LRB): CORONARY ARTERY BYPASS GRAFTING (CABG) TIMES THREE USING LEFT INTERNAL  MAMMARY ARTERY AND RIGHT GREATER SEPHANOUS VEIN (N/A) TRANSESOPHAGEAL ECHOCARDIOGRAM (TEE) (N/A)  1. CV- Paced rhythm- BP controlled-on Multaq, Lopressor, will restart Eliquis this evening 2. Pulm- no acute issues, continue IS 3. Renal- weight is trending toward baseline, will taper lasix 4. Dispo- patient stable, will d/c home today   LOS: 6 days    Ellwood Handler 06/11/2019   patient examined and medical record reviewed,agree with above note. Tharon Aquas Trigt III 06/11/2019

## 2019-06-11 NOTE — Discharge Instructions (Signed)

## 2019-06-11 NOTE — Progress Notes (Signed)
D/c'd sutures to chest

## 2019-06-11 NOTE — Progress Notes (Signed)
Discharge instructions explained and discussed with pt. perscriptions sent electronically to [place]. Pt going home with wife and belongings.

## 2019-06-11 NOTE — Progress Notes (Signed)
Pt already ambulated this am. Sts he feels well, independent, approximately 1200 ft. He did have some concerns of getting OOB independently therefore we practiced and I gave him tips. Discussed sternal precautions, IS, walking at home, diet, and CRPII. Good reception and I will refer him to [place]. He thoroughly enjoyed the program in '12.  Pt is interested in participating in Virtual Cardiac Rehab. Pt advised that Virtual Cardiac Rehab is provided at no cost to the patient. Checklist:  1. Pt has smart device  ie smartphone and/or ipad for downloading an app  Yes 2. Reliable internet/wifi service    Yes 3. Understands how to use their smartphone and navigate within an app.  Yes  Reviewed with pt the scheduling process for virtual cardiac rehab.  Pt verbalized understanding. [place] CES, ACSM 8:55 AM 06/11/2019

## 2019-06-12 ENCOUNTER — Telehealth: Payer: Self-pay | Admitting: Family Medicine

## 2019-06-12 NOTE — Anesthesia Postprocedure Evaluation (Signed)
Anesthesia Post Note  Patient: Derrick [name]  Procedure(s) Performed: CORONARY ARTERY BYPASS GRAFTING (CABG) TIMES THREE USING LEFT INTERNAL MAMMARY ARTERY AND RIGHT GREATER SEPHANOUS VEIN (N/A Chest) TRANSESOPHAGEAL ECHOCARDIOGRAM (TEE) (N/A )     Patient location during evaluation: SICU Anesthesia Type: General Level of consciousness: sedated Pain management: pain level controlled Vital Signs Assessment: post-procedure vital signs reviewed and stable Respiratory status: patient remains intubated per anesthesia plan Cardiovascular status: stable Postop Assessment: no apparent nausea or vomiting Anesthetic complications: no    Last Vitals:  Vitals:   06/11/19 0313 06/11/19 0726  BP: 118/72 118/74  Pulse: 75 77  Resp: 13 15  Temp: 37 C 36.9 C  SpO2: 98% 99%    Last Pain:  Vitals:   06/11/19 0726  TempSrc: Oral  PainSc: 0-No pain                 Loralai Eisman

## 2019-06-12 NOTE — Telephone Encounter (Signed)
Patient stated that he just came home from having triple bypass surgery. They advised him to reach out to his PCP but patient is not sure why. Patient stated that they did not tell him to schedule a follow up with [place].  Patient would like to know if you think it is best to get a follow up scheduled to see him.   Please advise   [facility] phone # [phone]

## 2019-06-12 NOTE — Telephone Encounter (Signed)
Let pt know I see no clear reason pt needs to be seen in follow up. CVTS will follow up with pt. If any new issues arise.. have pt call to make an appt.

## 2019-06-12 NOTE — Telephone Encounter (Signed)
Mr. Rodin notified as instructed by telephone.

## 2019-06-13 ENCOUNTER — Encounter

## 2019-06-13 ENCOUNTER — Telehealth: Payer: Medicare Other | Admitting: Cardiovascular Disease

## 2019-06-22 ENCOUNTER — Ambulatory Visit (INDEPENDENT_AMBULATORY_CARE_PROVIDER_SITE_OTHER): Payer: Medicare Other | Admitting: *Deleted

## 2019-06-22 DIAGNOSIS — I442 Atrioventricular block, complete: Secondary | ICD-10-CM | POA: Diagnosis not present

## 2019-06-22 LAB — CUP PACEART REMOTE DEVICE CHECK
Battery Remaining Longevity: 65 mo
Battery Voltage: 3 V
Brady Statistic AP VP Percent: 94.24 %
Brady Statistic AP VS Percent: 0.01 %
Brady Statistic AS VP Percent: 5.72 %
Brady Statistic AS VS Percent: 0.03 %
Brady Statistic RA Percent Paced: 92.27 %
Brady Statistic RV Percent Paced: 99.9 %
Date Time Interrogation Session: 20200626153313
Implantable Lead Implant Date: 20170517
Implantable Lead Implant Date: 20170517
Implantable Lead Location: 753859
Implantable Lead Location: 753860
Implantable Lead Model: 5076
Implantable Lead Model: 5076
Implantable Pulse Generator Implant Date: 20170517
Lead Channel Impedance Value: 323 Ohm
Lead Channel Impedance Value: 380 Ohm
Lead Channel Impedance Value: 456 Ohm
Lead Channel Impedance Value: 532 Ohm
Lead Channel Pacing Threshold Amplitude: 0.75 V
Lead Channel Pacing Threshold Amplitude: 0.875 V
Lead Channel Pacing Threshold Pulse Width: 0.4 ms
Lead Channel Pacing Threshold Pulse Width: 0.4 ms
Lead Channel Sensing Intrinsic Amplitude: 1.875 mV
Lead Channel Sensing Intrinsic Amplitude: 1.875 mV
Lead Channel Sensing Intrinsic Amplitude: 5.75 mV
Lead Channel Sensing Intrinsic Amplitude: 9 mV
Lead Channel Setting Pacing Amplitude: 2 V
Lead Channel Setting Pacing Amplitude: 2.5 V
Lead Channel Setting Pacing Pulse Width: 0.4 ms
Lead Channel Setting Sensing Sensitivity: 2.8 mV

## 2019-06-25 ENCOUNTER — Telehealth: Payer: Self-pay | Admitting: Physician Assistant

## 2019-06-25 NOTE — Telephone Encounter (Signed)

## 2019-06-26 ENCOUNTER — Encounter: Payer: Self-pay | Admitting: Physician Assistant

## 2019-06-26 ENCOUNTER — Telehealth (INDEPENDENT_AMBULATORY_CARE_PROVIDER_SITE_OTHER): Payer: Medicare Other | Admitting: Physician Assistant

## 2019-06-26 ENCOUNTER — Other Ambulatory Visit: Payer: Self-pay

## 2019-06-26 VITALS — BP 106/70 | HR 75 | Temp 98.4°F | Ht 69.0 in | Wt 172.5 lb

## 2019-06-26 DIAGNOSIS — I10 Essential (primary) hypertension: Secondary | ICD-10-CM

## 2019-06-26 DIAGNOSIS — Z950 Presence of cardiac pacemaker: Secondary | ICD-10-CM

## 2019-06-26 DIAGNOSIS — E782 Mixed hyperlipidemia: Secondary | ICD-10-CM

## 2019-06-26 DIAGNOSIS — I2510 Atherosclerotic heart disease of native coronary artery without angina pectoris: Secondary | ICD-10-CM

## 2019-06-26 DIAGNOSIS — I480 Paroxysmal atrial fibrillation: Secondary | ICD-10-CM

## 2019-06-26 NOTE — Patient Instructions (Signed)
Medication Instructions:   Your physician recommends that you continue on your current medications as directed. Please refer to the Current Medication list given to you today.  If you need a refill on your cardiac medications before your next appointment, please call your pharmacy.   Lab work: one week before  Appointment  Lipids and Liver panel   If you have labs (blood work) drawn today and your tests are completely normal, you will receive your results only by: [name] MyChart Message (if you have MyChart) OR . A paper copy in the mail If you have any lab test that is abnormal or we need to change your treatment, we will call you to review the results.  Testing/Procedures:NONE ORDERED  TODAY   Follow-Up: At [facility], you and your health needs are our priority.  As part of our continuing mission to provide you with exceptional heart care, we have created designated Provider Care Teams.  These Care Teams include your primary Cardiologist (physician) and Advanced Practice Providers (APPs -  Physician Assistants and Nurse Practitioners) who all work together to provide you with the care you need, when you need it. You will need a follow up appointment in:  3 months.  You may see [name], MD or one of the following Advanced Practice Providers on your designated Care Team: Richardson Dopp, PA-C [place], [place] . Daune Perch, NP  Any Other Special Instructions Will Be Listed Below (If Applicable).

## 2019-06-26 NOTE — Progress Notes (Signed)
Virtual Visit via Video Note   This visit type was conducted due to national recommendations for restrictions regarding the COVID-19 Pandemic (e.g. social distancing) in an effort to limit this patient's exposure and mitigate transmission in our community.  Due to his co-morbid illnesses, this patient is at least at moderate risk for complications without adequate follow up.  This format is felt to be most appropriate for this patient at this time.  All issues noted in this document were discussed and addressed.  A limited physical exam was performed with this format.  Please refer to the patient's chart for his consent to telehealth for [facility].   Date:  06/26/2019   ID:  Derrick [name], Derrick [name] [birth year], MRN [id number]  Patient Location: Home Provider Location: Office  PCP:  [name], MD  Cardiologist:  [name], MD   Electrophysiologist:  [name], MD   Evaluation Performed:  Follow-Up Visit  Chief Complaint:  [facility] follow up - s/p CABG  History of Present Illness:    Derrick [name] is a 78 y.o. male with   Coronary artery disease  Status post inferior MI in 2012 treated with DES to the RCA  S/p CABG 05/2019: L-LAD, S-OM1, S-PDA  Paroxysmal atrial fibrillation  CHADS2-VASc=4 (agex2, HTN, CAD) >> Apixaban  Sick sinus syndrome status post pacemaker  Hypertension  Hyperlipidemia  NSVT  Pulmonary sarcoid  MGUS  Chronic inflammatory demyelinating polyneuropathy  Colon cancer  The patient was last seen by Dr. Burt Knack in May 2020 with symptoms consistent with progressive angina.  Cardiac catheterization was arranged and demonstrated significant left main disease.  He was admitted 6/9-6/15 and underwent CABG x3 with Dr. [name].  Postoperative course was uneventful.  Today, he notes he is doing well.  He has not had significant shortness of breath.  His chest is still sore. His appetite is not that good.  He is walking 30  minutes a day.  He has not had significant leg swelling.  He has not had any bleeding issues.   The patient does not have symptoms concerning for COVID-19 infection (fever, chills, cough, or new shortness of breath).    Past Medical History:  Diagnosis Date  . Anemia   . BENIGN PROSTATIC HYPERTROPHY, WITH OBSTRUCTION 05/28/2010  . CAD (coronary artery disease)    a. s/p NSTEMI 06/01/11: DES to RCA;  b. cath 06/25/11:   dLM 50-60% (FFR 0.87), prox to mid LAD 40-50%, D1 50%, pCFX 50%, RCA stent ok, dPDA 80%, EF 55-60%.  His FFR was felt to be negative and therefore medical therapy was recommended ;  echo 6/12: EF 55-60%, mild AS   . Chronic back pain   . CIDP (chronic inflammatory demyelinating polyneuropathy) ([place]) 04/11/2012  . Colon cancer ([place]) dx'd 2000   "left"  . COLONIC POLYPS, ADENOMATOUS, HX OF 05/28/2010  . Complication of anesthesia    "stopped breathing; related to my sleep apnea" & urinary retention   . COPD (chronic obstructive pulmonary disease) ([place])    "associated w/lung sarcoidosis"  . 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 DISH (diffuse idiopathic skeletal hyperostosis) 12/02/2015  . Diverticulitis   . GENERALIZED OSTEOARTHROSIS UNSPECIFIED SITE 05/28/2010  . GERD 05/28/2010  . Heart murmur   . History of gout   . History of kidney stones   . HYPERLIPIDEMIA 05/28/2010  . Hypertension   . IgM lambda paraproteinemia   . 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 Malignant  neoplasm of descending colon ([place]) 05/28/2010  . MITRAL VALVE PROLAPSE 05/28/2010  . 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 NSTEMI (non-ST elevated myocardial infarction) ([place]) 06/03/11  . 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  . OSA on CPAP 05/28/2010  . PALPITATIONS, CHRONIC 05/28/2010  . Parotid gland pain 2010   infection  . Peripheral neuropathy    "tx'd w/targeted chemo" (05/12/2016)  . Pneumonia 2-3 times  . Presence of permanent cardiac  pacemaker   . 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 Sarcoidosis    pulmonalis  . UNSPECIFIED INFLAMMATORY AND TOXIC NEUROPATHY 05/28/2010  . Vision loss   . VITAMIN D DEFICIENCY 05/28/2010   Past Surgical History:  Procedure Laterality Date  . APPENDECTOMY  1954  . CATARACT EXTRACTION W/ INTRAOCULAR LENS IMPLANT Left   . COLON SURGERY  2000   decending colon   . CORONARY ANGIOPLASTY WITH STENT PLACEMENT  06/03/11  . CORONARY ARTERY BYPASS GRAFT N/A 06/05/2019   Procedure: CORONARY ARTERY BYPASS GRAFTING (CABG) TIMES THREE USING LEFT INTERNAL MAMMARY ARTERY AND RIGHT GREATER SEPHANOUS VEIN;  Surgeon: [name], MD;  Location: [facility];  Service: Open Heart Surgery;  Laterality: N/A;  . ELECTROPHYSIOLOGIC STUDY N/A 05/12/2016   Procedure: Cardioversion;  Surgeon: [name], MD;  Location: [place] CV LAB;  Service: Cardiovascular;  Laterality: N/A;  . EP IMPLANTABLE DEVICE N/A 05/12/2016   Procedure: Pacemaker Implant;  Surgeon: [name], MD;  Location: [facility] [place] CV LAB;  Service: Cardiovascular;  Laterality: N/A;  . EYE SURGERY    . INGUINAL HERNIA REPAIR Left 02/21/2019   Procedure: LEFT INGUINAL HERNIA REPAIR WITH MESH;  Surgeon: [name], MD;  Location: [facility];  Service: General;  Laterality: Left;  . INSERT / REPLACE / REMOVE PACEMAKER  05/12/2016  . INTRAVASCULAR PRESSURE WIRE/FFR STUDY N/A 05/15/2019   Procedure: INTRAVASCULAR PRESSURE WIRE/FFR STUDY;  Surgeon: [name], MD;  Location: [facility] CV LAB;  Service: Cardiovascular;  Laterality: N/A;  . KNEE ARTHROSCOPY Right 2003  . LEFT HEART CATH AND CORONARY ANGIOGRAPHY N/A 05/15/2019   Procedure: LEFT HEART CATH AND CORONARY ANGIOGRAPHY;  Surgeon: [name], MD;  Location: [facility] [place] CV LAB;  Service: Cardiovascular;  Laterality: N/A;  . LEFT HEART CATHETERIZATION WITH CORONARY ANGIOGRAM N/A 03/31/2015   Procedure: LEFT HEART CATHETERIZATION WITH CORONARY ANGIOGRAM;  Surgeon: [name], MD;   [facility] C[facility];  Service: Cardiovascular;  Laterality: N/A;  . LUNG SURGERY  2001   "open lung dissection"  . MOHS SURGERY Left ~ 2008   ear  . PROSTATE BIOPSY  2010  . TEE WITHOUT CARDIOVERSION N/A 06/05/2019   Procedure: TRANSESOPHAGEAL ECHOCARDIOGRAM (TEE);  Surgeon: [name], [name], MD;  Location: [facility];  Service: Open Heart Surgery;  Laterality: N/A;  . TONSILLECTOMY  1950     Current Meds  Medication Sig  . albuterol (PROVENTIL HFA;VENTOLIN HFA) 108 (90 Base) MCG/ACT inhaler Inhale 2 puffs into the lungs every 6 (six) hours as needed for wheezing or shortness of breath.  [name] apixaban (ELIQUIS) 5 MG TABS tablet Take 1 tablet (5 mg total) by mouth 2 (two) times daily.  [name] aspirin EC 81 MG tablet Take 1 tablet (81 mg total) by mouth daily.  [name] atorvastatin (LIPITOR) 20 MG tablet Take 1 tablet (20 mg total) by mouth daily at 6 PM.  . bimatoprost (LUMIGAN) 0.01 % SOLN Place 1 drop into both eyes at bedtime.  . Cholecalciferol 25 MCG (1000 UT) tablet Take 1,000 Units  by mouth daily.   . dorzolamide-timolol (COSOPT) 22.3-6.8 MG/ML ophthalmic solution Place 1 drop into both eyes 2 (two) times daily.   . finasteride (PROSCAR) 5 MG tablet Take 5 mg by mouth daily.   . fluticasone (FLONASE) 50 MCG/ACT nasal spray Place 2 sprays into both nostrils 2 (two) times daily.  . Fluticasone Propionate, Inhal, (FLOVENT DISKUS) 100 MCG/BLIST AEPB Inhale 1 Inhaler into the lungs 2 (two) times daily. (Patient taking differently: Inhale 1 puff into the lungs 2 (two) times daily as needed (shortness of breath). )  . metoprolol tartrate (LOPRESSOR) 25 MG tablet Take 0.5 tablets (12.5 mg total) by mouth 2 (two) times daily.  . MULTAQ 400 MG tablet TAKE 1 TABLET BY MOUTH TWICE DAILY WITH MEALS (Patient taking differently: Take 400 mg by mouth 2 (two) times a day. )  . Multiple Vitamin (MULTIVITAMIN WITH MINERALS) TABS tablet Take 1 tablet by mouth daily.  . pantoprazole (PROTONIX) 40 MG tablet TAKE 1 TABLET BY  MOUTH ONCE DAILY (Patient taking differently: Take 40 mg by mouth daily. )  . Polyethyl Glycol-Propyl Glycol (SYSTANE OP) Place 1 drop into both eyes daily as needed (dry eyes).  . tadalafil (CIALIS) 5 MG tablet Take 5 mg by mouth at bedtime.   . triamterene-hydrochlorothiazide (MAXZIDE-25) 37.5-25 MG tablet Take 1 tablet by mouth once daily     Allergies:   Patient has no known allergies.   Social History   Tobacco Use  . Smoking status: Never Smoker  . Smokeless tobacco: Never Used  Substance Use Topics  . Alcohol use: Yes    Comment: occasional  . Drug use: No     Family Hx: The patient's family history includes Arrhythmia (age of onset: 6) in his brother; Arthritis in his mother; Cancer in his brother; Colon polyps in his mother; Congestive Heart Failure in his brother; Coronary artery disease in his brother and mother; Heart attack in his brother; Heart disease in his mother.  ROS:   Please see the history of present illness.    All other systems reviewed and are negative.   Prior CV studies:   The following studies were reviewed today:  Pre CABG Dopplers 06/01/2019 Bilat ICA 1-39  Echo 05/22/2019 EF 55-60, Gr 2 DD, apical HK, severe LAE, mild MAC, mild AS (mean 12), mild to mod dilation of ascending aorta (43 mm)  Cardiac Catheterization 05/15/2019 1. Severe distal left main stenosis, hemodynamically significant by DFR analysis 2. Moderate proximal LAD stenosis 3. Continued patency of the RCA stents 4. Nonobstructive LCx stenosis 5. Mild aortic stenosis with peak-to-peak gradient < 10 mmHg by pullback Recommend: cardiac surgical consultation for consideration of CABG in this patient with symptomatic left main disease. Last echo 09/2018 should be updated to reevaluate severity of aortic stenosis   Labs/Other Tests and Data Reviewed:    EKG:  No ECG reviewed.  Recent Labs: 08/17/2018: TSH 2.02 06/06/2019: Magnesium 2.3 06/08/2019: ALT 22 06/09/2019: BUN 20;  Creatinine, Ser 0.84; Hemoglobin 8.8; Platelets 145; Potassium 3.9; Sodium 136   Recent Lipid Panel Lab Results  Component Value Date/Time   CHOL 154 05/29/2018 08:06 AM   TRIG 52 05/29/2018 08:06 AM   HDL 60 05/29/2018 08:06 AM   CHOLHDL 2.6 05/29/2018 08:06 AM   CHOLHDL 2.7 03/19/2016 09:22 AM   LDLCALC 84 05/29/2018 08:06 AM    Wt Readings from Last 3 Encounters:  06/26/19 172 lb 8 oz (78.2 kg)  06/11/19 185 lb 6.5 oz (84.1 kg)  06/04/19 182  lb 12.8 oz (82.9 kg)     Objective:    Vital Signs:  BP 106/70   Pulse 75   Temp 98.4 F (36.9 C)   Ht _0  (1.753 m)   Wt 172 lb 8 oz (78.2 kg)   SpO2 98%   BMI 25.47 kg/m    VITAL SIGNS:  reviewed GEN:  no acute distress RESPIRATORY:  normal respiratory effort, symmetric expansion MUSCULOSKELETAL:  median sternotomy wound without erythema or drainage NEURO:  alert and oriented x 3, no obvious focal deficit PSYCH:  normal affect  ASSESSMENT & PLAN:    1. Coronary artery disease involving native coronary artery of native heart without angina pectoris Hx of inferior MI and DES to the RCA in 2012.  He is now s/p recent CABG.  He is progressing well.  I have asked him to contact me if he does not hear from cardiac rehabilitation by next week.  Continue ASA, statin, beta-blocker. I have asked him to supplement his diet with Ensure or Boost until his appetite recovers.  FU with Dr. Burt Knack in 3 mos.   2. Paroxysmal atrial fibrillation ([name]) Continue anticoagulation with Apixaban.  No bleeding problems noted.  He also remains on Multaq.  Recent device interrogation with 6 AF episodes, the longest of which was 9 hours.   3. Cardiac pacemaker in situ Continue follow up with EP as planned.  His lower rate was set to 75 in the hospital.  I will ask Dr. Curt Bears to review to see if his rate needs to be set back to 60.    4. Essential hypertension The patient's blood pressure is controlled on his current regimen.  Continue current therapy.     5. Mixed hyperlipidemia Continue statin Rx.  Arrange follow up fasting Lipids and LFTs in 3 mos.   Time:   Today, I have spent 16 minutes with the patient with telehealth technology discussing the above problems.     Medication Adjustments/Labs and Tests Ordered: Current medicines are reviewed at length with the patient today.  Concerns regarding medicines are outlined above.   Tests Ordered: Orders Placed This Encounter  Procedures  . Lipid panel  . Hepatic function panel    Medication Changes: No orders of the defined types were placed in this encounter.   Follow Up:  In Person in 3 month(s) with Dr. Burt Knack  Signed, Richardson Dopp, PA-C  06/26/2019 2:22 PM    [place]

## 2019-06-28 ENCOUNTER — Telehealth (HOSPITAL_COMMUNITY): Payer: Self-pay

## 2019-06-28 ENCOUNTER — Ambulatory Visit: Payer: Medicare Other

## 2019-06-28 ENCOUNTER — Encounter: Payer: Self-pay | Admitting: Cardiology

## 2019-06-28 NOTE — Telephone Encounter (Signed)
Pt insurance is active and benefits verified through Medicare Co-pay 0, DED $198/$198 met, out of pocket 0/0 met, co-insurance 20%. no pre-authorization required. Passport, 06/28/2019@2:50pm, REF# [id number] ° °2ndary insurance is active and benefits verified through [employer]. Co-pay 0, DED 0/0 met, out of pocket 0/0 met, co-insurance 0. No pre-authorization required. Passport, 06/28/2019@2:50pm ° °Will contact patient to see if he is interested in the Cardiac Rehab Program. If interested, patient will need to complete follow up appt. Once completed, patient will be contacted for scheduling upon review by the RN Navigator. °

## 2019-06-28 NOTE — Progress Notes (Signed)
Remote pacemaker transmission.   

## 2019-07-04 ENCOUNTER — Telehealth (HOSPITAL_COMMUNITY): Payer: Self-pay

## 2019-07-04 ENCOUNTER — Other Ambulatory Visit: Payer: Self-pay | Admitting: Cardiothoracic Surgery

## 2019-07-04 ENCOUNTER — Ambulatory Visit: Payer: Medicare Other | Admitting: Cardiothoracic Surgery

## 2019-07-04 DIAGNOSIS — Z951 Presence of aortocoronary bypass graft: Secondary | ICD-10-CM

## 2019-07-05 ENCOUNTER — Other Ambulatory Visit: Payer: Self-pay

## 2019-07-05 ENCOUNTER — Ambulatory Visit (INDEPENDENT_AMBULATORY_CARE_PROVIDER_SITE_OTHER): Payer: Self-pay | Admitting: Cardiothoracic Surgery

## 2019-07-05 ENCOUNTER — Encounter: Payer: Self-pay | Admitting: Cardiothoracic Surgery

## 2019-07-05 ENCOUNTER — Encounter: Payer: Medicare Other | Admitting: Family Medicine

## 2019-07-05 ENCOUNTER — Ambulatory Visit
Admission: RE | Admit: 2019-07-05 | Discharge: 2019-07-05 | Disposition: A | Discharge status: Home / Self Care | Payer: Medicare Other | Source: Ambulatory Visit | Attending: Cardiothoracic Surgery | Admitting: Cardiothoracic Surgery

## 2019-07-05 ENCOUNTER — Telehealth (HOSPITAL_COMMUNITY): Payer: Self-pay

## 2019-07-05 VITALS — BP 132/78 | HR 82 | Temp 97.7°F | Resp 20 | Ht 69.0 in | Wt 177.0 lb

## 2019-07-05 DIAGNOSIS — I712 Thoracic aortic aneurysm, without rupture, unspecified: Secondary | ICD-10-CM

## 2019-07-05 DIAGNOSIS — Z951 Presence of aortocoronary bypass graft: Secondary | ICD-10-CM

## 2019-07-05 DIAGNOSIS — I2510 Atherosclerotic heart disease of native coronary artery without angina pectoris: Secondary | ICD-10-CM

## 2019-07-05 NOTE — Telephone Encounter (Signed)
Cardiac Rehab Medication Review by a Pharmacist  Does the patient  feel that his/her medications are working for him/her?  yes  Has the patient been experiencing any side effects to the medications prescribed?  no  Does the patient measure his/her own blood pressure or blood glucose at home?  yes every day. On 7/9 patient reported blood pressure was higher than normal at 132/79 but usually is 120/70 or lower.   Does the patient have any problems obtaining medications due to transportation or finances?   no  Understanding of regimen: excellent Understanding of indications: excellent Potential of compliance: excellent   Pharmacist comments: N/A  Cristela Felt, PharmD PGY1 Pharmacy Resident [employer]: [phone]

## 2019-07-05 NOTE — Progress Notes (Signed)
PCP is [name], MD Referring Provider is [name], MD  Chief Complaint  Patient presents with  . Routine Post Op    f/u from surgery with CXR s/p CABG 06/05/19    HPI: 1 month postop check after urgent multivessel CABG for left main stenosis with unstable angina.  Patient progressing well walking 30 minutes a day.  He has chronic intermittent atrial fibrillation on Eliquis with a pacemaker.  No symptoms of angina or CHF.  Chest x-ray taken today personally reviewed showing clear lung fields, no pleural effusion, sternal wires intact Sternal incision and leg incision healing well.  Patient is scheduled to start cardiac rehab at the hospital July 20 orientation.  Patient is now able to drive, lift up to 10 pounds, run errands but should not start yard work.  His appetite has started to to improve and he is starting to regain weight.  Past Medical History:  Diagnosis Date  . Anemia   . BENIGN PROSTATIC HYPERTROPHY, WITH OBSTRUCTION 05/28/2010  . CAD (coronary artery disease)    a. s/p NSTEMI 06/01/11: DES to RCA;  b. cath 06/25/11:   dLM 50-60% (FFR 0.87), prox to mid LAD 40-50%, D1 50%, pCFX 50%, RCA stent ok, dPDA 80%, EF 55-60%.  His FFR was felt to be negative and therefore medical therapy was recommended ;  echo 6/12: EF 55-60%, mild AS   . Chronic back pain   . 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 Colon cancer ([place]) dx'd 2000   "left"  . COLONIC POLYPS, ADENOMATOUS, HX OF 05/28/2010  . Complication of anesthesia    "stopped breathing; related to my sleep apnea" & urinary retention   . COPD (chronic obstructive pulmonary disease) ([place])    "associated w/lung sarcoidosis"  . 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 DISH (diffuse idiopathic skeletal hyperostosis) 12/02/2015  . Diverticulitis   . GENERALIZED OSTEOARTHROSIS UNSPECIFIED SITE 05/28/2010  . GERD 05/28/2010  . Heart murmur   . History of gout   . History of kidney stones   .  HYPERLIPIDEMIA 05/28/2010  . Hypertension   . IgM lambda paraproteinemia   . 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 Malignant neoplasm of descending colon ([place]) 05/28/2010  . MITRAL VALVE PROLAPSE 05/28/2010  . 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 NSTEMI (non-ST elevated myocardial infarction) ([place]) 06/03/11  . 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  . OSA on CPAP 05/28/2010  . PALPITATIONS, CHRONIC 05/28/2010  . Parotid gland pain 2010   infection  . Peripheral neuropathy    "tx'd w/targeted chemo" (05/12/2016)  . Pneumonia 2-3 times  . Presence of permanent cardiac pacemaker   . 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 Sarcoidosis    pulmonalis  . UNSPECIFIED INFLAMMATORY AND TOXIC NEUROPATHY 05/28/2010  . Vision loss   . VITAMIN D DEFICIENCY 05/28/2010    Past Surgical History:  Procedure Laterality Date  . APPENDECTOMY  1954  . CATARACT EXTRACTION W/ INTRAOCULAR LENS IMPLANT Left   . COLON SURGERY  2000   decending colon   . CORONARY ANGIOPLASTY WITH STENT PLACEMENT  06/03/11  . CORONARY ARTERY BYPASS GRAFT N/A 06/05/2019   Procedure: CORONARY ARTERY BYPASS GRAFTING (CABG) TIMES THREE USING LEFT INTERNAL MAMMARY ARTERY AND RIGHT GREATER SEPHANOUS VEIN;  Surgeon: [name], MD;  Location: [facility];  Service: Open Heart Surgery;  Laterality: N/A;  . ELECTROPHYSIOLOGIC STUDY N/A 05/12/2016   Procedure: Cardioversion;  Surgeon: [name], MD;  Location: [facility];  Service: Cardiovascular;  Laterality: N/A;  . EP IMPLANTABLE DEVICE N/A 05/12/2016   Procedure: Pacemaker Implant;  Surgeon: [name], MD;  Location: [facility] [facility];  Service: Cardiovascular;  Laterality: N/A;  . EYE SURGERY    . INGUINAL HERNIA REPAIR Left 02/21/2019   Procedure: LEFT INGUINAL HERNIA REPAIR WITH MESH;  Surgeon: [name], MD;  Location: [facility];  Service: General;  Laterality: Left;  . INSERT / REPLACE / REMOVE PACEMAKER   05/12/2016  . INTRAVASCULAR PRESSURE WIRE/FFR STUDY N/A 05/15/2019   Procedure: INTRAVASCULAR PRESSURE WIRE/FFR STUDY;  Surgeon: [name], MD;  Location: [facility];  Service: Cardiovascular;  Laterality: N/A;  . KNEE ARTHROSCOPY Right 2003  . LEFT HEART CATH AND CORONARY ANGIOGRAPHY N/A 05/15/2019   Procedure: LEFT HEART CATH AND CORONARY ANGIOGRAPHY;  Surgeon: [name], MD;  Location: [facility];  Service: Cardiovascular;  Laterality: N/A;  . LEFT HEART CATHETERIZATION WITH CORONARY ANGIOGRAM N/A 03/31/2015   Procedure: LEFT HEART CATHETERIZATION WITH CORONARY ANGIOGRAM;  Surgeon: [name], MD;  Location: [facility];  Service: Cardiovascular;  Laterality: N/A;  . LUNG SURGERY  2001   "open lung dissection"  . MOHS SURGERY Left ~ 2008   ear  . PROSTATE BIOPSY  2010  . TEE WITHOUT CARDIOVERSION N/A 06/05/2019   Procedure: TRANSESOPHAGEAL ECHOCARDIOGRAM (TEE);  Surgeon: [name], [name], MD;  Location: [facility];  Service: Open Heart Surgery;  Laterality: N/A;  . TONSILLECTOMY  1950    Family History  Problem Relation Age of Onset  . Arthritis Mother        severe  . Coronary artery disease Mother   . Colon polyps Mother   . Heart disease Mother   . Coronary artery disease Brother   . Cancer Brother        choriocarcinoma  . Arrhythmia Brother 52       Afib/Tachycardia  . Heart attack Brother   . Congestive Heart Failure Brother     Social History Social History   Tobacco Use  . Smoking status: Never Smoker  . Smokeless tobacco: Never Used  Substance Use Topics  . Alcohol use: Yes    Comment: occasional  . Drug use: No    Current Outpatient Medications  Medication Sig Dispense Refill  . albuterol (PROVENTIL HFA;VENTOLIN HFA) 108 (90 Base) MCG/ACT inhaler Inhale 2 puffs into the lungs every 6 (six) hours as needed for wheezing or shortness of breath. 1 Inhaler 1  . apixaban (ELIQUIS) 5 MG TABS tablet Take 1 tablet (5 mg total) by mouth 2 (two)  times daily. 180 tablet 1  . aspirin EC 81 MG tablet Take 1 tablet (81 mg total) by mouth daily. 150 tablet 2  . atorvastatin (LIPITOR) 20 MG tablet Take 1 tablet (20 mg total) by mouth daily at 6 PM. 90 tablet 3  . bimatoprost (LUMIGAN) 0.01 % SOLN Place 1 drop into both eyes at bedtime.    . Cholecalciferol 25 MCG (1000 UT) tablet Take 1,000 Units by mouth daily.     . dorzolamide-timolol (COSOPT) 22.3-6.8 MG/ML ophthalmic solution Place 1 drop into both eyes 2 (two) times daily.     . finasteride (PROSCAR) 5 MG tablet Take 5 mg by mouth daily.     . fluticasone (FLONASE) 50 MCG/ACT nasal spray Place 2 sprays into both nostrils 2 (two) times daily.    . Fluticasone Propionate, Inhal, (FLOVENT DISKUS) 100 MCG/BLIST AEPB Inhale 1  Inhaler into the lungs 2 (two) times daily. (Patient taking differently: Inhale 1 puff into the lungs 2 (two) times daily as needed (shortness of breath). ) 60 each 1  . metoprolol tartrate (LOPRESSOR) 25 MG tablet Take 0.5 tablets (12.5 mg total) by mouth 2 (two) times daily. 30 tablet 3  . MULTAQ 400 MG tablet TAKE 1 TABLET BY MOUTH TWICE DAILY WITH MEALS (Patient taking differently: Take 400 mg by mouth 2 (two) times a day. ) 180 tablet 1  . Multiple Vitamin (MULTIVITAMIN WITH MINERALS) TABS tablet Take 1 tablet by mouth daily.    . pantoprazole (PROTONIX) 40 MG tablet TAKE 1 TABLET BY MOUTH ONCE DAILY (Patient taking differently: Take 40 mg by mouth daily. ) 90 tablet 3  . Polyethyl Glycol-Propyl Glycol (SYSTANE OP) Place 1 drop into both eyes daily as needed (dry eyes).    . tadalafil (CIALIS) 5 MG tablet Take 5 mg by mouth at bedtime.     . triamterene-hydrochlorothiazide (MAXZIDE-25) 37.5-25 MG tablet Take 1 tablet by mouth once daily 90 tablet 3   No current facility-administered medications for this visit.     No Known Allergies  Review of Systems  Weight starting to improve No fever cough shortness of breath diarrhea or symptoms of COVID virus No  edema Walking 30 minutes a day, feels good  BP 132/78   Pulse 82   Temp 97.7 F (36.5 C) (Skin)   Resp 20   Ht 5\' 9"  (1.753 m)   Wt 177 lb (80.3 kg)   SpO2 97% Comment: RA  BMI 26.14 kg/m  Physical Exam      Exam    General- alert and comfortable    Neck- no JVD, no cervical adenopathy palpable, no carotid bruit   Lungs- clear without rales, wheezes   Cor- regular rate and rhythm, no murmur , gallop   Abdomen- soft, non-tender   Extremities - warm, non-tender, minimal edema   Neuro- oriented, appropriate, no focal weakness   Diagnostic Tests: Chest x-ray reviewed and is clear  Impression: Excellent early recovery after multivessel CABG Ready to start cardiac rehab Continue current meds No lifting more than 10 pounds until next visit in 1 month  Plan: Return in 1 month for review of progress.  Len Childs, MD [facility] [phone]

## 2019-07-10 ENCOUNTER — Encounter (HOSPITAL_COMMUNITY): Payer: Self-pay

## 2019-07-10 ENCOUNTER — Encounter (HOSPITAL_COMMUNITY)
Admission: RE | Admit: 2019-07-10 | Discharge: 2019-07-10 | Disposition: A | Discharge status: Home / Self Care | Payer: Medicare Other | Source: Ambulatory Visit | Attending: Cardiovascular Disease | Admitting: Cardiovascular Disease

## 2019-07-10 ENCOUNTER — Other Ambulatory Visit: Payer: Self-pay

## 2019-07-10 VITALS — BP 108/60 | HR 77 | Ht 68.0 in | Wt 175.0 lb

## 2019-07-10 DIAGNOSIS — Z951 Presence of aortocoronary bypass graft: Secondary | ICD-10-CM | POA: Insufficient documentation

## 2019-07-10 NOTE — Progress Notes (Signed)
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  albuterol (PROVENTIL HFA;VENTOLIN HFA) 108 (90 Base) MCG/ACT inhaler, Inhale 2 puffs into the lungs every 6 (six) hours as needed for wheezing or shortness of breath. (Patient not taking: Reported on 07/05/2019), Disp: 1 Inhaler, Rfl: 1 .  apixaban (ELIQUIS) 5 MG TABS tablet, Take 1 tablet (5 mg total) by mouth 2 (two) times daily., Disp: 180 tablet, Rfl: 1 .  aspirin EC 81 MG tablet, Take 1 tablet (81 mg total) by mouth daily., Disp: 150 tablet, Rfl: 2 .  atorvastatin (LIPITOR) 20 MG tablet, Take 1 tablet (20 mg total) by mouth daily at 6 PM., Disp: 90 tablet, Rfl: 3 .  bimatoprost (LUMIGAN) 0.01 % SOLN, Place 1 drop into both eyes at bedtime., Disp: , Rfl:  .  Cholecalciferol 25 MCG (1000 UT) tablet, Take 1,000 Units by mouth daily. , Disp: , Rfl:  .  dorzolamide-timolol (COSOPT) 22.3-6.8 MG/ML ophthalmic solution, Place 1 drop into both eyes 2 (two) times daily. , Disp: , Rfl:  .  finasteride (PROSCAR) 5 MG tablet, Take 5 mg by mouth daily. , Disp: , Rfl:  .  fluticasone (FLONASE) 50 MCG/ACT nasal spray, Place 2 sprays into both nostrils 2 (two) times daily., Disp: , Rfl:  .  Fluticasone Propionate, Inhal, (FLOVENT DISKUS) 100 MCG/BLIST AEPB, Inhale 1 Inhaler into the lungs 2 (two) times daily. (Patient not taking: Reported on 07/05/2019), Disp: 60 each, Rfl: 1 .  metoprolol tartrate (LOPRESSOR) 25 MG tablet, Take 0.5 tablets (12.5 mg total) by mouth 2  (two) times daily., Disp: 30 tablet, Rfl: 3 .  MULTAQ 400 MG tablet, TAKE 1 TABLET BY MOUTH TWICE DAILY WITH MEALS (Patient taking differently: Take 400 mg by mouth 2 (two) times a day. ), Disp: 180 tablet, Rfl: 1 .  Multiple Vitamin (MULTIVITAMIN WITH MINERALS) TABS tablet, Take 1 tablet by mouth daily., Disp: , Rfl:  .  pantoprazole (PROTONIX) 40 MG tablet, TAKE 1 TABLET BY MOUTH ONCE DAILY (Patient taking differently: Take 40 mg by mouth daily. ), Disp: 90 tablet, Rfl: 3 .  Polyethyl Glycol-Propyl Glycol (SYSTANE OP), Place 1 drop into both eyes daily as needed (dry eyes)., Disp: , Rfl:  .  tadalafil (CIALIS) 5 MG tablet, Take 5 mg by mouth at bedtime. , Disp: , Rfl:  .  triamterene-hydrochlorothiazide (MAXZIDE-25) 37.5-25 MG tablet, Take 1 tablet by mouth once daily, Disp: 90 tablet, Rfl: 3  Past Medical History: Past Medical History:  Diagnosis Date  . Anemia   . BENIGN PROSTATIC HYPERTROPHY, WITH OBSTRUCTION 05/28/2010  . CAD (coronary artery disease)    a. s/p NSTEMI 06/01/11: DES to RCA;  b. cath 06/25/11:   dLM 50-60% (FFR 0.87), prox to mid LAD 40-50%, D1 50%, pCFX 50%, RCA stent ok, dPDA 80%, EF 55-60%.  His FFR was felt to be negative and therefore medical therapy was recommended ;  echo 6/12: EF 55-60%, mild AS   . Chronic back pain   .  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 Colon cancer ([place]) dx'd 2000   "left"  . COLONIC POLYPS, ADENOMATOUS, HX OF 05/28/2010  . Complication of anesthesia    "stopped breathing; related to my sleep apnea" & urinary retention   . COPD (chronic obstructive pulmonary disease) ([place])    "associated w/lung sarcoidosis"  . 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 DISH (diffuse idiopathic skeletal hyperostosis) 12/02/2015  . Diverticulitis   . GENERALIZED OSTEOARTHROSIS UNSPECIFIED SITE 05/28/2010  . GERD 05/28/2010  . Heart murmur   . History of gout   . History of kidney stones   . HYPERLIPIDEMIA 05/28/2010  .  Hypertension   . IgM lambda paraproteinemia   . 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 Malignant neoplasm of descending colon ([place]) 05/28/2010  . MITRAL VALVE PROLAPSE 05/28/2010  . 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 NSTEMI (non-ST elevated myocardial infarction) ([place]) 06/03/11  . 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  . OSA on CPAP 05/28/2010  . PALPITATIONS, CHRONIC 05/28/2010  . Parotid gland pain 2010   infection  . Peripheral neuropathy    "tx'd w/targeted chemo" (05/12/2016)  . Pneumonia 2-3 times  . Presence of permanent cardiac pacemaker   . 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 Sarcoidosis    pulmonalis  . UNSPECIFIED INFLAMMATORY AND TOXIC NEUROPATHY 05/28/2010  . Vision loss   . VITAMIN D DEFICIENCY 05/28/2010    Tobacco Use: Social History   Tobacco Use  Smoking Status Never Smoker  Smokeless Tobacco Never Used    Labs: Recent Review Flowsheet Data    Labs for ITP Cardiac and Pulmonary Rehab Latest Ref Rng & Units 06/05/2019 06/05/2019 06/07/2019 06/08/2019 06/08/2019   Cholestrol 100 - 199 mg/dL - - - - -   LDLCALC 0 - 99 mg/dL - - - - -   HDL >39 mg/dL - - - - -   Trlycerides 0 - 149 mg/dL - - - - -   Hemoglobin A1c 4.8 - 5.6 % - - - - -   PHART 7.350 - 7.450 7.277(L) - - - -   PCO2ART 32.0 - 48.0 mmHg 48.9(H) - - - -   HCO3 20.0 - 28.0 mmol/L 22.8 - - - -   TCO2 22 - 32 mmol/L 24 23 - - -   ACIDBASEDEF 0.0 - 2.0 mmol/L 4.0(H) - - - -   O2SAT % 91.0 - 63.1 54.0 58.3      Capillary Blood Glucose: Lab Results  Component Value Date   GLUCAP 97 06/11/2019   GLUCAP 127 (H) 06/10/2019   GLUCAP 88 06/10/2019   GLUCAP 115 (H) 06/10/2019   GLUCAP 93 06/10/2019     Exercise Target Goals: Exercise Program Goal: Individual exercise prescription set using results from initial 6 min walk test and THRR while considering  patient's activity barriers and safety.   Exercise Prescription Goal: Starting with  aerobic activity 30 plus minutes a day, 3 days per week for initial exercise prescription. Provide home exercise prescription and guidelines that participant acknowledges understanding prior to discharge.  Activity Barriers & Risk Stratification: Activity Barriers & Cardiac Risk Stratification - 07/10/19 1526      Activity Barriers & Cardiac Risk Stratification   Activity Barriers  None    Cardiac Risk Stratification  High       6 Minute Walk: 6 Minute Walk    Row Name 07/10/19 1525         6 Minute  Walk   Phase  Initial     Distance  1434 feet     Walk Time  6 minutes     # of Rest Breaks  0     MPH  2.72     METS  2.93     RPE  12     Perceived Dyspnea   0     VO2 Peak  10.2     Symptoms  No     Resting HR  77 bpm     Resting BP  108/60     Resting Oxygen Saturation   98 %     Exercise Oxygen Saturation  during 6 min walk  98 %     Max Ex. HR  113 bpm     Max Ex. BP  122/82     2 Minute Post BP  120/72        Oxygen Initial Assessment:   Oxygen Re-Evaluation:   Oxygen Discharge (Final Oxygen Re-Evaluation):   Initial Exercise Prescription: Initial Exercise Prescription - 07/10/19 1500      Date of Initial Exercise RX and Referring Provider   Date  07/10/19    Referring Provider  [name] MD    Expected Discharge Date  08/24/19      Treadmill   MPH  2    Grade  1    Minutes  15    METs  2.81      NuStep   Level  2    SPM  75    Minutes  15    METs  2.5      Prescription Details   Frequency (times per week)  3x    Duration  Progress to 30 minutes of continuous aerobic without signs/symptoms of physical distress      Intensity   THRR 40-80% of Max Heartrate  58-115    Ratings of Perceived Exertion  11-13    Perceived Dyspnea  0-4      Progression   Progression  Continue progressive overload as per policy without signs/symptoms or physical distress.      Resistance Training   Training Prescription  Yes    Weight  3lbs    Reps   10-15       Perform Capillary Blood Glucose checks as needed.  Exercise Prescription Changes:   Exercise Comments:   Exercise Goals and Review: Exercise Goals    Row Name 07/10/19 1529             Exercise Goals   Increase Physical Activity  Yes       Intervention  Provide advice, education, support and counseling about physical activity/exercise needs.;Develop an individualized exercise prescription for aerobic and resistive training based on initial evaluation findings, risk stratification, comorbidities and participant's personal goals.       Expected Outcomes  Short Term: Attend rehab on a regular basis to increase amount of physical activity.;Long Term: Add in home exercise to make exercise part of routine and to increase amount of physical activity.;Long Term: Exercising regularly at least 3-5 days a week.       Increase Strength and Stamina  Yes       Intervention  Provide advice, education, support and counseling about physical activity/exercise needs.;Develop an individualized exercise prescription for aerobic and resistive training based on initial evaluation findings, risk stratification, comorbidities and participant's personal goals.       Expected Outcomes  Short Term: Increase workloads from initial exercise prescription for  resistance, speed, and METs.;Short Term: Perform resistance training exercises routinely during rehab and add in resistance training at home;Long Term: Improve cardiorespiratory fitness, muscular endurance and strength as measured by increased METs and functional capacity (6MWT)       Able to understand and use rate of perceived exertion (RPE) scale  Yes       Intervention  Provide education and explanation on how to use RPE scale       Expected Outcomes  Short Term: Able to use RPE daily in rehab to express subjective intensity level;Long Term:  Able to use RPE to guide intensity level when exercising independently       Knowledge and understanding  of Target Heart Rate Range (THRR)  Yes       Intervention  Provide education and explanation of THRR including how the numbers were predicted and where they are located for reference       Expected Outcomes  Short Term: Able to state/look up THRR;Long Term: Able to use THRR to govern intensity when exercising independently;Short Term: Able to use daily as guideline for intensity in rehab       Able to check pulse independently  Yes       Intervention  Provide education and demonstration on how to check pulse in carotid and radial arteries.;Review the importance of being able to check your own pulse for safety during independent exercise       Expected Outcomes  Short Term: Able to explain why pulse checking is important during independent exercise;Long Term: Able to check pulse independently and accurately       Understanding of Exercise Prescription  Yes       Intervention  Provide education, explanation, and written materials on patient's individual exercise prescription       Expected Outcomes  Short Term: Able to explain program exercise prescription;Long Term: Able to explain home exercise prescription to exercise independently          Exercise Goals Re-Evaluation :    Discharge Exercise Prescription (Final Exercise Prescription Changes):   Nutrition:  Target Goals: Understanding of nutrition guidelines, daily intake of sodium 1500mg , cholesterol 200mg , calories 30% from fat and 7% or less from saturated fats, daily to have 5 or more servings of fruits and vegetables.  Biometrics: Pre Biometrics - 07/10/19 1529      Pre Biometrics   Height  5\' 8"  (1.727 m)    Weight  79.4 kg    Waist Circumference  35 inches    Hip Circumference  39 inches    Waist to Hip Ratio  0.9 %    BMI (Calculated)  26.62    Triceps Skinfold  24 mm    % Body Fat  27 %    Grip Strength  32 kg    Flexibility  0 in    Single Leg Stand  3 seconds        Nutrition Therapy Plan and Nutrition  Goals:   Nutrition Assessments:   Nutrition Goals Re-Evaluation:   Nutrition Goals Discharge (Final Nutrition Goals Re-Evaluation):   Psychosocial: Target Goals: Acknowledge presence or absence of significant depression and/or stress, maximize coping skills, provide positive support system. Participant is able to verbalize types and ability to use techniques and skills needed for reducing stress and depression.  Initial Review & Psychosocial Screening: Initial Psych Review & Screening - 07/10/19 1553      Initial Review   Current issues with  None Identified  Family Dynamics   Good Support System?  Yes   Dominica Severin has his wife and family for support     Barriers   Psychosocial barriers to participate in program  There are no identifiable barriers or psychosocial needs.      Screening Interventions   Interventions  Encouraged to exercise       Quality of Life Scores: Quality of Life - 07/10/19 1531      Quality of Life   Select  Quality of Life      Quality of Life Scores   Health/Function Pre  25.2 %    Socioeconomic Pre  28.93 %    Psych/Spiritual Pre  30 %    Family Pre  27.6 %    GLOBAL Pre  27.31 %      Scores of 19 and below usually indicate a poorer quality of life in these areas.  A difference of  2-3 points is a clinically meaningful difference.  A difference of 2-3 points in the total score of the Quality of Life Index has been associated with significant improvement in overall quality of life, self-image, physical symptoms, and general health in studies assessing change in quality of life.  PHQ-9: Recent Review Flowsheet Data    Depression screen [facility] 2/9 07/10/2019 06/13/2018 03/29/2017 02/10/2016 02/10/2015   Decreased Interest 0 0 0 0 0   Down, Depressed, Hopeless 0 0 0 0 0   PHQ - 2 Score 0 0 0 0 0   Altered sleeping - 1 - - -   Tired, decreased energy - 3 - - -   Change in appetite - 0 - - -   Feeling bad or failure about yourself  - 0 - - -   Trouble  concentrating - 0 - - -   Moving slowly or fidgety/restless - 0 - - -   Suicidal thoughts - 0 - - -   PHQ-9 Score - 4 - - -   Difficult doing work/chores - Somewhat difficult - - -     Interpretation of Total Score  Total Score Depression Severity:  1-4 = Minimal depression, 5-9 = Mild depression, 10-14 = Moderate depression, 15-19 = Moderately severe depression, 20-27 = Severe depression   Psychosocial Evaluation and Intervention:   Psychosocial Re-Evaluation:   Psychosocial Discharge (Final Psychosocial Re-Evaluation):   Vocational Rehabilitation: Provide vocational rehab assistance to qualifying candidates.   Vocational Rehab Evaluation & Intervention: Vocational Rehab - 07/10/19 1555      Initial Vocational Rehab Evaluation & Intervention   Assessment shows need for Vocational Rehabilitation  No       Education: Education Goals: Education classes will be provided on a weekly basis, covering required topics. Participant will state understanding/return demonstration of topics presented.  Learning Barriers/Preferences: Learning Barriers/Preferences - 07/10/19 1532      Learning Barriers/Preferences   Learning Barriers  Sight    Learning Preferences  Written Material;Skilled Demonstration;Individual Instruction;Verbal Instruction       Education Topics: Hypertension, Hypertension Reduction -Define heart disease and high blood pressure. Discus how high blood pressure affects the body and ways to reduce high blood pressure.   Exercise and Your Heart -Discuss why it is important to exercise, the FITT principles of exercise, normal and abnormal responses to exercise, and how to exercise safely.   Angina -Discuss definition of angina, causes of angina, treatment of angina, and how to decrease risk of having angina.   Cardiac Medications -Review what the following cardiac medications are used  for, how they affect the body, and side effects that may occur when taking  the medications.  Medications include Aspirin, Beta blockers, calcium channel blockers, ACE Inhibitors, angiotensin receptor blockers, diuretics, digoxin, and antihyperlipidemics.   Congestive Heart Failure -Discuss the definition of CHF, how to live with CHF, the signs and symptoms of CHF, and how keep track of weight and sodium intake.   Heart Disease and Intimacy -Discus the effect sexual activity has on the heart, how changes occur during intimacy as we age, and safety during sexual activity.   Smoking Cessation / COPD -Discuss different methods to quit smoking, the health benefits of quitting smoking, and the definition of COPD.   Nutrition I: Fats -Discuss the types of cholesterol, what cholesterol does to the heart, and how cholesterol levels can be controlled.   Nutrition II: Labels -Discuss the different components of food labels and how to read food label   Heart Parts/Heart Disease and PAD -Discuss the anatomy of the heart, the pathway of blood circulation through the heart, and these are affected by heart disease.   Stress I: Signs and Symptoms -Discuss the causes of stress, how stress may lead to anxiety and depression, and ways to limit stress.   Stress II: Relaxation -Discuss different types of relaxation techniques to limit stress.   Warning Signs of Stroke / TIA -Discuss definition of a stroke, what the signs and symptoms are of a stroke, and how to identify when someone is having stroke.   Knowledge Questionnaire Score: Knowledge Questionnaire Score - 07/10/19 1530      Knowledge Questionnaire Score   Pre Score  22/24       Core Components/Risk Factors/Patient Goals at Admission: Personal Goals and Risk Factors at Admission - 07/10/19 1532      Core Components/Risk Factors/Patient Goals on Admission    Weight Management  Weight Loss;Yes    Intervention  Weight Management: Develop a combined nutrition and exercise program designed to reach desired  caloric intake, while maintaining appropriate intake of nutrient and fiber, sodium and fats, and appropriate energy expenditure required for the weight goal.;Weight Management: Provide education and appropriate resources to help participant work on and attain dietary goals.    Admit Weight  175 lb 0.7 oz (79.4 kg)    Goal Weight: Long Term  165 lb 5.5 oz (75 kg)    Hypertension  Yes    Intervention  Provide education on lifestyle modifcations including regular physical activity/exercise, weight management, moderate sodium restriction and increased consumption of fresh fruit, vegetables, and low fat dairy, alcohol moderation, and smoking cessation.;Monitor prescription use compliance.    Expected Outcomes  Short Term: Continued assessment and intervention until BP is < 140/72mm HG in hypertensive participants. < 130/29mm HG in hypertensive participants with diabetes, heart failure or chronic kidney disease.;Long Term: Maintenance of blood pressure at goal levels.    Lipids  Yes    Intervention  Provide education and support for participant on nutrition & aerobic/resistive exercise along with prescribed medications to achieve LDL 70mg , HDL >40mg .    Expected Outcomes  Short Term: Participant states understanding of desired cholesterol values and is compliant with medications prescribed. Participant is following exercise prescription and nutrition guidelines.;Long Term: Cholesterol controlled with medications as prescribed, with individualized exercise RX and with personalized nutrition plan. Value goals: LDL < 70mg , HDL > 40 mg.       Core Components/Risk Factors/Patient Goals Review:    Core Components/Risk Factors/Patient Goals at Discharge (Final Review):  ITP Comments: ITP Comments    Row Name 07/10/19 1427           ITP Comments  Dr. Fransico Him, Medical Director          Comments: Patient attended orientation on 07/10/2019 to review rules and guidelines for program.  Completed 6  minute walk test, Intitial ITP, and exercise prescription.  VSS. Telemetry-Vpaced.  Asymptomatic. Safety measures and social distancing in place per CDC guidelines.Barnet Pall, RN,BSN 07/10/2019 4:02 PM

## 2019-07-16 ENCOUNTER — Other Ambulatory Visit: Payer: Self-pay

## 2019-07-16 ENCOUNTER — Encounter (HOSPITAL_COMMUNITY)
Admission: RE | Admit: 2019-07-16 | Discharge: 2019-07-16 | Disposition: A | Discharge status: Home / Self Care | Payer: Medicare Other | Source: Ambulatory Visit | Attending: Cardiovascular Disease | Admitting: Cardiovascular Disease

## 2019-07-16 ENCOUNTER — Encounter (HOSPITAL_COMMUNITY): Payer: Medicare Other

## 2019-07-16 DIAGNOSIS — Z951 Presence of aortocoronary bypass graft: Secondary | ICD-10-CM

## 2019-07-16 NOTE — Progress Notes (Signed)
Daily Session Note  Patient Details  Name: Derrick [name] MRN: [id number] Date of Birth: [birth year] Referring Provider:     [facility] from 07/10/2019 in [place]  Referring Provider  [name] MD      Encounter Date: 07/16/2019  Check In: Session Check In - 07/16/19 0718      Check-In   Supervising physician immediately available to respond to emergencies  [facility] immediately available    Physician(s)  Dr. Earnest Conroy    Location  [facility]    Staff Present  Dorma Russell, MS,ACSM CEP, Exercise Physiologist;Olinty Celesta Aver, MS, ACSM CEP, Exercise Physiologist;Dejuan Elman Karle Starch, RN, Bjorn Loser, MS, Exercise Physiologist    Virtual Visit  No    Medication changes reported      No    Fall or balance concerns reported     No    Tobacco Cessation  No Change    Warm-up and Cool-down  Performed on first and last piece of equipment    Resistance Training Performed  Yes    VAD Patient?  No    PAD/SET Patient?  No      Pain Assessment   Currently in Pain?  No/denies    Multiple Pain Sites  No       Capillary Blood Glucose: No results found for this or any previous visit (from the past 24 hour(s)).    Social History   Tobacco Use  Smoking Status Never Smoker  Smokeless Tobacco Never Used    Goals Met:  Exercise tolerated well  Goals Unmet:  Not Applicable  Comments: Pt started cardiac rehab today.  Pt tolerated light exercise without difficulty. VSS, telemetry-V paced, asymptomatic.  Medication list reconciled. Pt denies barriers to medicaiton compliance.  PSYCHOSOCIAL ASSESSMENT:  PHQ-0. Pt exhibits positive coping skills, hopeful outlook with supportive family. No psychosocial needs identified at this time, no psychosocial interventions necessary.  Pt oriented to exercise equipment and routine.    Understanding verbalized.   Dr. Fransico Him is Medical Director for Cardiac  Rehab at [facility].

## 2019-07-18 ENCOUNTER — Encounter (HOSPITAL_COMMUNITY)
Admission: RE | Admit: 2019-07-18 | Discharge: 2019-07-18 | Disposition: A | Discharge status: Home / Self Care | Payer: Medicare Other | Source: Ambulatory Visit | Attending: Cardiovascular Disease | Admitting: Cardiovascular Disease

## 2019-07-18 ENCOUNTER — Other Ambulatory Visit: Payer: Self-pay

## 2019-07-18 ENCOUNTER — Ambulatory Visit: Payer: Medicare Other | Admitting: Cardiothoracic Surgery

## 2019-07-18 ENCOUNTER — Encounter (HOSPITAL_COMMUNITY): Payer: Medicare Other

## 2019-07-18 DIAGNOSIS — Z951 Presence of aortocoronary bypass graft: Secondary | ICD-10-CM | POA: Diagnosis not present

## 2019-07-19 NOTE — Progress Notes (Signed)
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  albuterol (PROVENTIL HFA;VENTOLIN HFA) 108 (90 Base) MCG/ACT inhaler, Inhale 2 puffs into the lungs every 6 (six) hours as needed for wheezing or shortness of breath. (Patient not taking: Reported on 07/05/2019), Disp: 1 Inhaler, Rfl: 1 .  apixaban (ELIQUIS) 5 MG TABS tablet, Take 1 tablet (5 mg total) by mouth 2 (two) times daily., Disp: 180 tablet, Rfl: 1 .  aspirin EC 81 MG tablet, Take 1 tablet (81 mg total) by mouth daily., Disp: 150 tablet, Rfl: 2 .  atorvastatin (LIPITOR) 20 MG tablet, Take 1 tablet (20 mg total) by mouth daily at 6 PM., Disp: 90 tablet, Rfl: 3 .  bimatoprost (LUMIGAN) 0.01 % SOLN, Place 1 drop into both eyes at bedtime., Disp: , Rfl:  .  Cholecalciferol 25 MCG (1000 UT) tablet, Take 1,000 Units by mouth daily. , Disp: , Rfl:  .  dorzolamide-timolol (COSOPT) 22.3-6.8 MG/ML ophthalmic solution, Place 1 drop into both eyes 2 (two) times daily. , Disp: , Rfl:  .  finasteride (PROSCAR) 5 MG tablet, Take 5 mg by mouth daily. , Disp: , Rfl:  .  fluticasone (FLONASE) 50 MCG/ACT nasal spray, Place 2 sprays into both nostrils 2 (two) times daily., Disp: , Rfl:  .  Fluticasone Propionate, Inhal, (FLOVENT DISKUS) 100 MCG/BLIST AEPB, Inhale 1 Inhaler into the lungs 2 (two) times daily. (Patient not taking: Reported on 07/05/2019), Disp: 60 each, Rfl: 1 .  metoprolol tartrate (LOPRESSOR) 25 MG tablet, Take 0.5 tablets (12.5 mg total) by mouth 2  (two) times daily., Disp: 30 tablet, Rfl: 3 .  MULTAQ 400 MG tablet, TAKE 1 TABLET BY MOUTH TWICE DAILY WITH MEALS (Patient taking differently: Take 400 mg by mouth 2 (two) times a day. ), Disp: 180 tablet, Rfl: 1 .  Multiple Vitamin (MULTIVITAMIN WITH MINERALS) TABS tablet, Take 1 tablet by mouth daily., Disp: , Rfl:  .  pantoprazole (PROTONIX) 40 MG tablet, TAKE 1 TABLET BY MOUTH ONCE DAILY (Patient taking differently: Take 40 mg by mouth daily. ), Disp: 90 tablet, Rfl: 3 .  Polyethyl Glycol-Propyl Glycol (SYSTANE OP), Place 1 drop into both eyes daily as needed (dry eyes)., Disp: , Rfl:  .  tadalafil (CIALIS) 5 MG tablet, Take 5 mg by mouth at bedtime. , Disp: , Rfl:  .  triamterene-hydrochlorothiazide (MAXZIDE-25) 37.5-25 MG tablet, Take 1 tablet by mouth once daily, Disp: 90 tablet, Rfl: 3  Past Medical History: Past Medical History:  Diagnosis Date  . Anemia   . BENIGN PROSTATIC HYPERTROPHY, WITH OBSTRUCTION 05/28/2010  . CAD (coronary artery disease)    a. s/p NSTEMI 06/01/11: DES to RCA;  b. cath 06/25/11:   dLM 50-60% (FFR 0.87), prox to mid LAD 40-50%, D1 50%, pCFX 50%, RCA stent ok, dPDA 80%, EF 55-60%.  His FFR was felt to be negative and therefore medical therapy was recommended ;  echo 6/12: EF 55-60%, mild AS   . Chronic back pain   .  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 Colon cancer ([place]) dx'd 2000   "left"  . COLONIC POLYPS, ADENOMATOUS, HX OF 05/28/2010  . Complication of anesthesia    "stopped breathing; related to my sleep apnea" & urinary retention   . COPD (chronic obstructive pulmonary disease) ([place])    "associated w/lung sarcoidosis"  . 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 DISH (diffuse idiopathic skeletal hyperostosis) 12/02/2015  . Diverticulitis   . GENERALIZED OSTEOARTHROSIS UNSPECIFIED SITE 05/28/2010  . GERD 05/28/2010  . Heart murmur   . History of gout   . History of kidney stones   . HYPERLIPIDEMIA 05/28/2010  .  Hypertension   . IgM lambda paraproteinemia   . 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 Malignant neoplasm of descending colon ([place]) 05/28/2010  . MITRAL VALVE PROLAPSE 05/28/2010  . 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 NSTEMI (non-ST elevated myocardial infarction) ([place]) 06/03/11  . 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  . OSA on CPAP 05/28/2010  . PALPITATIONS, CHRONIC 05/28/2010  . Parotid gland pain 2010   infection  . Peripheral neuropathy    "tx'd w/targeted chemo" (05/12/2016)  . Pneumonia 2-3 times  . Presence of permanent cardiac pacemaker   . 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 Sarcoidosis    pulmonalis  . UNSPECIFIED INFLAMMATORY AND TOXIC NEUROPATHY 05/28/2010  . Vision loss   . VITAMIN D DEFICIENCY 05/28/2010    Tobacco Use: Social History   Tobacco Use  Smoking Status Never Smoker  Smokeless Tobacco Never Used    Labs: Recent Review Flowsheet Data    Labs for ITP Cardiac and Pulmonary Rehab Latest Ref Rng & Units 06/05/2019 06/05/2019 06/07/2019 06/08/2019 06/08/2019   Cholestrol 100 - 199 mg/dL - - - - -   LDLCALC 0 - 99 mg/dL - - - - -   HDL >39 mg/dL - - - - -   Trlycerides 0 - 149 mg/dL - - - - -   Hemoglobin A1c 4.8 - 5.6 % - - - - -   PHART 7.350 - 7.450 7.277(L) - - - -   PCO2ART 32.0 - 48.0 mmHg 48.9(H) - - - -   HCO3 20.0 - 28.0 mmol/L 22.8 - - - -   TCO2 22 - 32 mmol/L 24 23 - - -   ACIDBASEDEF 0.0 - 2.0 mmol/L 4.0(H) - - - -   O2SAT % 91.0 - 63.1 54.0 58.3      Capillary Blood Glucose: Lab Results  Component Value Date   GLUCAP 97 06/11/2019   GLUCAP 127 (H) 06/10/2019   GLUCAP 88 06/10/2019   GLUCAP 115 (H) 06/10/2019   GLUCAP 93 06/10/2019     Exercise Target Goals: Exercise Program Goal: Individual exercise prescription set using results from initial 6 min walk test and THRR while considering  patient's activity barriers and safety.   Exercise Prescription Goal: Initial exercise  prescription builds to 30-45 minutes a day of aerobic activity, 2-3 days per week.  Home exercise guidelines will be given to patient during program as part of exercise prescription that the participant will acknowledge.  Activity Barriers & Risk Stratification: Activity Barriers & Cardiac Risk Stratification - 07/10/19 1526      Activity Barriers & Cardiac Risk Stratification   Activity Barriers  None    Cardiac Risk Stratification  High       6 Minute Walk: 6 Minute Walk    Row Name 07/10/19 1525  6 Minute Walk   Phase  Initial     Distance  1434 feet     Walk Time  6 minutes     # of Rest Breaks  0     MPH  2.72     METS  2.93     RPE  12     Perceived Dyspnea   0     VO2 Peak  10.2     Symptoms  No     Resting HR  77 bpm     Resting BP  108/60     Resting Oxygen Saturation   98 %     Exercise Oxygen Saturation  during 6 min walk  98 %     Max Ex. HR  113 bpm     Max Ex. BP  122/82     2 Minute Post BP  120/72        Oxygen Initial Assessment:   Oxygen Re-Evaluation:   Oxygen Discharge (Final Oxygen Re-Evaluation):   Initial Exercise Prescription: Initial Exercise Prescription - 07/10/19 1500      Date of Initial Exercise RX and Referring Provider   Date  07/10/19    Referring Provider  [name] MD    Expected Discharge Date  08/24/19      Treadmill   MPH  2    Grade  1    Minutes  15    METs  2.81      NuStep   Level  2    SPM  75    Minutes  15    METs  2.5      Prescription Details   Frequency (times per week)  3x    Duration  Progress to 30 minutes of continuous aerobic without signs/symptoms of physical distress      Intensity   THRR 40-80% of Max Heartrate  58-115    Ratings of Perceived Exertion  11-13    Perceived Dyspnea  0-4      Progression   Progression  Continue progressive overload as per policy without signs/symptoms or physical distress.      Resistance Training   Training Prescription  Yes    Weight   3lbs    Reps  10-15       Perform Capillary Blood Glucose checks as needed.  Exercise Prescription Changes: Exercise Prescription Changes    Row Name 07/16/19 0800             Response to Exercise   Blood Pressure (Admit)  118/68       Blood Pressure (Exercise)  124/72       Blood Pressure (Exit)  122/72       Heart Rate (Admit)  92 bpm       Heart Rate (Exercise)  104 bpm       Heart Rate (Exit)  85 bpm       Rating of Perceived Exertion (Exercise)  12       Symptoms  none       Duration  Continue with 30 min of aerobic exercise without signs/symptoms of physical distress.       Intensity  THRR unchanged         Progression   Progression  Continue to progress workloads to maintain intensity without signs/symptoms of physical distress.       Average METs  2.8         Resistance Training   Training Prescription  Yes  Weight  3lbs       Reps  10-15       Time  10 Minutes         Interval Training   Interval Training  No         Treadmill   MPH  2       Grade  1       Minutes  15       METs  2.81         NuStep   Level  2       SPM  85       Minutes  15       METs  2.7         Home Exercise Plan   Plans to continue exercise at  Home (comment) Pt is walking 30-35 minutes daily.       Frequency  Add 4 additional days to program exercise sessions.          Exercise Comments: Exercise Comments    Row Name 07/16/19 1031           Exercise Comments  Off to a good start with exercise. Pt is walking at home in addition to exercise at cardiac rehab. Reviewed goals with patient.          Exercise Goals and Review: Exercise Goals    Row Name 07/10/19 1529             Exercise Goals   Increase Physical Activity  Yes       Intervention  Provide advice, education, support and counseling about physical activity/exercise needs.;Develop an individualized exercise prescription for aerobic and resistive training based on initial evaluation findings, risk  stratification, comorbidities and participant's personal goals.       Expected Outcomes  Short Term: Attend rehab on a regular basis to increase amount of physical activity.;Long Term: Add in home exercise to make exercise part of routine and to increase amount of physical activity.;Long Term: Exercising regularly at least 3-5 days a week.       Increase Strength and Stamina  Yes       Intervention  Provide advice, education, support and counseling about physical activity/exercise needs.;Develop an individualized exercise prescription for aerobic and resistive training based on initial evaluation findings, risk stratification, comorbidities and participant's personal goals.       Expected Outcomes  Short Term: Increase workloads from initial exercise prescription for resistance, speed, and METs.;Short Term: Perform resistance training exercises routinely during rehab and add in resistance training at home;Long Term: Improve cardiorespiratory fitness, muscular endurance and strength as measured by increased METs and functional capacity (6MWT)       Able to understand and use rate of perceived exertion (RPE) scale  Yes       Intervention  Provide education and explanation on how to use RPE scale       Expected Outcomes  Short Term: Able to use RPE daily in rehab to express subjective intensity level;Long Term:  Able to use RPE to guide intensity level when exercising independently       Knowledge and understanding of Target Heart Rate Range (THRR)  Yes       Intervention  Provide education and explanation of THRR including how the numbers were predicted and where they are located for reference       Expected Outcomes  Short Term: Able to state/look up THRR;Long Term: Able to use THRR to govern intensity when exercising  independently;Short Term: Able to use daily as guideline for intensity in rehab       Able to check pulse independently  Yes       Intervention  Provide education and demonstration on how to  check pulse in carotid and radial arteries.;Review the importance of being able to check your own pulse for safety during independent exercise       Expected Outcomes  Short Term: Able to explain why pulse checking is important during independent exercise;Long Term: Able to check pulse independently and accurately       Understanding of Exercise Prescription  Yes       Intervention  Provide education, explanation, and written materials on patient's individual exercise prescription       Expected Outcomes  Short Term: Able to explain program exercise prescription;Long Term: Able to explain home exercise prescription to exercise independently          Exercise Goals Re-Evaluation : Exercise Goals Re-Evaluation    Row Name 07/16/19 [phone]             Exercise Goal Re-Evaluation   Exercise Goals Review  Increase Physical Activity;Knowledge and understanding of Target Heart Rate Range (THRR);Able to understand and use rate of perceived exertion (RPE) scale;Able to check pulse independently       Comments  Patient tolerated first session of exercise well without symptoms. Patient able to understand and use RPE scale appropriately. Reviewed target heart rate range with patient, and pt has a heart rate monitor to check pulse with. Pt is walking 30-35 minutes daily.       Expected Outcomes  Increase workloads as tolerated to help achieve health and fitness goals.          Discharge Exercise Prescription (Final Exercise Prescription Changes): Exercise Prescription Changes - 07/16/19 0800      Response to Exercise   Blood Pressure (Admit)  118/68    Blood Pressure (Exercise)  124/72    Blood Pressure (Exit)  122/72    Heart Rate (Admit)  92 bpm    Heart Rate (Exercise)  104 bpm    Heart Rate (Exit)  85 bpm    Rating of Perceived Exertion (Exercise)  12    Symptoms  none    Duration  Continue with 30 min of aerobic exercise without signs/symptoms of physical distress.    Intensity  THRR  unchanged      Progression   Progression  Continue to progress workloads to maintain intensity without signs/symptoms of physical distress.    Average METs  2.8      Resistance Training   Training Prescription  Yes    Weight  3lbs    Reps  10-15    Time  10 Minutes      Interval Training   Interval Training  No      Treadmill   MPH  2    Grade  1    Minutes  15    METs  2.81      NuStep   Level  2    SPM  85    Minutes  15    METs  2.7      Home Exercise Plan   Plans to continue exercise at  Home (comment)   Pt is walking 30-35 minutes daily.   Frequency  Add 4 additional days to program exercise sessions.       Nutrition:  Target Goals: Understanding of nutrition guidelines, daily intake of sodium 1500mg ,  cholesterol 200mg , calories 30% from fat and 7% or less from saturated fats, daily to have 5 or more servings of fruits and vegetables.  Biometrics: Pre Biometrics - 07/10/19 1529      Pre Biometrics   Height  5\' 8"  (1.727 m)    Weight  79.4 kg    Waist Circumference  35 inches    Hip Circumference  39 inches    Waist to Hip Ratio  0.9 %    BMI (Calculated)  26.62    Triceps Skinfold  24 mm    % Body Fat  27 %    Grip Strength  32 kg    Flexibility  0 in    Single Leg Stand  3 seconds        Nutrition Therapy Plan and Nutrition Goals:   Nutrition Assessments:   Nutrition Goals Re-Evaluation:   Nutrition Goals Re-Evaluation:   Nutrition Goals Discharge (Final Nutrition Goals Re-Evaluation):   Psychosocial: Target Goals: Acknowledge presence or absence of significant depression and/or stress, maximize coping skills, provide positive support system. Participant is able to verbalize types and ability to use techniques and skills needed for reducing stress and depression.  Initial Review & Psychosocial Screening: Initial Psych Review & Screening - 07/10/19 1553      Initial Review   Current issues with  None Identified      Family  Dynamics   Good Support System?  Yes   Dominica Severin has his wife and family for support     Barriers   Psychosocial barriers to participate in program  There are no identifiable barriers or psychosocial needs.      Screening Interventions   Interventions  Encouraged to exercise       Quality of Life Scores: Quality of Life - 07/10/19 1531      Quality of Life   Select  Quality of Life      Quality of Life Scores   Health/Function Pre  25.2 %    Socioeconomic Pre  28.93 %    Psych/Spiritual Pre  30 %    Family Pre  27.6 %    GLOBAL Pre  27.31 %      Scores of 19 and below usually indicate a poorer quality of life in these areas.  A difference of  2-3 points is a clinically meaningful difference.  A difference of 2-3 points in the total score of the Quality of Life Index has been associated with significant improvement in overall quality of life, self-image, physical symptoms, and general health in studies assessing change in quality of life.  PHQ-9: Recent Review Flowsheet Data    Depression screen [facility] 2/9 07/16/2019 07/10/2019 06/13/2018 03/29/2017 02/10/2016   Decreased Interest 0 0 0 0 0   Down, Depressed, Hopeless 0 0 0 0 0   PHQ - 2 Score 0 0 0 0 0   Altered sleeping - - 1 - -   Tired, decreased energy - - 3 - -   Change in appetite - - 0 - -   Feeling bad or failure about yourself  - - 0 - -   Trouble concentrating - - 0 - -   Moving slowly or fidgety/restless - - 0 - -   Suicidal thoughts - - 0 - -   PHQ-9 Score - - 4 - -   Difficult doing work/chores - - Somewhat difficult - -     Interpretation of Total Score  Total Score Depression Severity:  1-4 =  Minimal depression, 5-9 = Mild depression, 10-14 = Moderate depression, 15-19 = Moderately severe depression, 20-27 = Severe depression   Psychosocial Evaluation and Intervention: Psychosocial Evaluation - 07/16/19 0736      Psychosocial Evaluation & Interventions   Interventions  Encouraged to exercise with the program and  follow exercise prescription    Comments  No psychosocial needs identified.  N[place]xon enjoys fishing, Architect working, and gardening.    Expected Outcomes  Jevaun will maintain a positive outlook with good coping skills.    Continue Psychosocial Services   No Follow up required       Psychosocial Re-Evaluation:   Psychosocial Discharge (Final Psychosocial Re-Evaluation):   Vocational Rehabilitation: Provide vocational rehab assistance to qualifying candidates.   Vocational Rehab Evaluation & Intervention: Vocational Rehab - 07/10/19 1555      Initial Vocational Rehab Evaluation & Intervention   Assessment shows need for Vocational Rehabilitation  No       Education: Education Goals: Education classes will be provided on a weekly basis, covering required topics. Participant will state understanding/return demonstration of topics presented.  Learning Barriers/Preferences: Learning Barriers/Preferences - 07/10/19 1532      Learning Barriers/Preferences   Learning Barriers  Sight    Learning Preferences  Written Material;Skilled Demonstration;Individual Instruction;Verbal Instruction       Education Topics: Count Your Pulse:  -Group instruction provided by verbal instruction, demonstration, patient participation and written materials to support subject.  Instructors address importance of being able to find your pulse and how to count your pulse when at home without a heart monitor.  Patients get hands on experience counting their pulse with staff help and individually.   Heart Attack, Angina, and Risk Factor Modification:  -Group instruction provided by verbal instruction, video, and written materials to support subject.  Instructors address signs and symptoms of angina and heart attacks.    Also discuss risk factors for heart disease and how to make changes to improve heart health risk factors.   Functional Fitness:  -Group instruction provided by verbal instruction,  demonstration, patient participation, and written materials to support subject.  Instructors address safety measures for doing things around the house.  Discuss how to get up and down off the floor, how to pick things up properly, how to safely get out of a chair without assistance, and balance training.   Meditation and Mindfulness:  -Group instruction provided by verbal instruction, patient participation, and written materials to support subject.  Instructor addresses importance of mindfulness and meditation practice to help reduce stress and improve awareness.  Instructor also leads participants through a meditation exercise.    Stretching for Flexibility and Mobility:  -Group instruction provided by verbal instruction, patient participation, and written materials to support subject.  Instructors lead participants through series of stretches that are designed to increase flexibility thus improving mobility.  These stretches are additional exercise for major muscle groups that are typically performed during regular warm up and cool down.   Hands Only CPR:  -Group verbal, video, and participation provides a basic overview of AHA guidelines for community CPR. Role-play of emergencies allow participants the opportunity to practice calling for help and chest compression technique with discussion of AED use.   Hypertension: -Group verbal and written instruction that provides a basic overview of hypertension including the most recent diagnostic guidelines, risk factor reduction with self-care instructions and medication management.    Nutrition I class: Heart Healthy Eating:  -Group instruction provided by PowerPoint slides, verbal discussion, and written materials  to support subject matter. The instructor gives an explanation and review of the Therapeutic Lifestyle Changes diet recommendations, which includes a discussion on lipid goals, dietary fat, sodium, fiber, plant stanol/sterol esters,  sugar, and the components of a well-balanced, healthy diet.   Nutrition II class: Lifestyle Skills:  -Group instruction provided by PowerPoint slides, verbal discussion, and written materials to support subject matter. The instructor gives an explanation and review of label reading, grocery shopping for heart health, heart healthy recipe modifications, and ways to make healthier choices when eating out.   Diabetes Question & Answer:  -Group instruction provided by PowerPoint slides, verbal discussion, and written materials to support subject matter. The instructor gives an explanation and review of diabetes co-morbidities, pre- and post-prandial blood glucose goals, pre-exercise blood glucose goals, signs, symptoms, and treatment of hypoglycemia and hyperglycemia, and foot care basics.   Diabetes Blitz:  -Group instruction provided by PowerPoint slides, verbal discussion, and written materials to support subject matter. The instructor gives an explanation and review of the physiology behind type 1 and type 2 diabetes, diabetes medications and rational behind using different medications, pre- and post-prandial blood glucose recommendations and Hemoglobin A1c goals, diabetes diet, and exercise including blood glucose guidelines for exercising safely.    Portion Distortion:  -Group instruction provided by PowerPoint slides, verbal discussion, written materials, and food models to support subject matter. The instructor gives an explanation of serving size versus portion size, changes in portions sizes over the last 20 years, and what consists of a serving from each food group.   Stress Management:  -Group instruction provided by verbal instruction, video, and written materials to support subject matter.  Instructors review role of stress in heart disease and how to cope with stress positively.     Exercising on Your Own:  -Group instruction provided by verbal instruction, power point, and written  materials to support subject.  Instructors discuss benefits of exercise, components of exercise, frequency and intensity of exercise, and end points for exercise.  Also discuss use of nitroglycerin and activating EMS.  Review options of places to exercise outside of rehab.  Review guidelines for sex with heart disease.   Cardiac Drugs I:  -Group instruction provided by verbal instruction and written materials to support subject.  Instructor reviews cardiac drug classes: antiplatelets, anticoagulants, beta blockers, and statins.  Instructor discusses reasons, side effects, and lifestyle considerations for each drug class.   Cardiac Drugs II:  -Group instruction provided by verbal instruction and written materials to support subject.  Instructor reviews cardiac drug classes: angiotensin converting enzyme inhibitors (ACE-I), angiotensin II receptor blockers (ARBs), nitrates, and calcium channel blockers.  Instructor discusses reasons, side effects, and lifestyle considerations for each drug class.   Anatomy and Physiology of the Circulatory System:  Group verbal and written instruction and models provide basic cardiac anatomy and physiology, with the coronary electrical and arterial systems. Review of: AMI, Angina, Valve disease, Heart Failure, Peripheral Artery Disease, Cardiac Arrhythmia, Pacemakers, and the ICD.   Other Education:  -Group or individual verbal, written, or video instructions that support the educational goals of the cardiac rehab program.   Holiday Eating Survival Tips:  -Group instruction provided by PowerPoint slides, verbal discussion, and written materials to support subject matter. The instructor gives patients tips, tricks, and techniques to help them not only survive but enjoy the holidays despite the onslaught of food that accompanies the holidays.   Knowledge Questionnaire Score: Knowledge Questionnaire Score - 07/10/19 1530  Knowledge Questionnaire Score   Pre  Score  22/24       Core Components/Risk Factors/Patient Goals at Admission: Personal Goals and Risk Factors at Admission - 07/10/19 1532      Core Components/Risk Factors/Patient Goals on Admission    Weight Management  Weight Loss;Yes    Intervention  Weight Management: Develop a combined nutrition and exercise program designed to reach desired caloric intake, while maintaining appropriate intake of nutrient and fiber, sodium and fats, and appropriate energy expenditure required for the weight goal.;Weight Management: Provide education and appropriate resources to help participant work on and attain dietary goals.    Admit Weight  175 lb 0.7 oz (79.4 kg)    Goal Weight: Long Term  165 lb 5.5 oz (75 kg)    Hypertension  Yes    Intervention  Provide education on lifestyle modifcations including regular physical activity/exercise, weight management, moderate sodium restriction and increased consumption of fresh fruit, vegetables, and low fat dairy, alcohol moderation, and smoking cessation.;Monitor prescription use compliance.    Expected Outcomes  Short Term: Continued assessment and intervention until BP is < 140/52mm HG in hypertensive participants. < 130/51mm HG in hypertensive participants with diabetes, heart failure or chronic kidney disease.;Long Term: Maintenance of blood pressure at goal levels.    Lipids  Yes    Intervention  Provide education and support for participant on nutrition & aerobic/resistive exercise along with prescribed medications to achieve LDL 70mg , HDL >40mg .    Expected Outcomes  Short Term: Participant states understanding of desired cholesterol values and is compliant with medications prescribed. Participant is following exercise prescription and nutrition guidelines.;Long Term: Cholesterol controlled with medications as prescribed, with individualized exercise RX and with personalized nutrition plan. Value goals: LDL < 70mg , HDL > 40 mg.       Core Components/Risk  Factors/Patient Goals Review:  Goals and Risk Factor Review    Row Name 07/16/19 0737             Core Components/Risk Factors/Patient Goals Review   Personal Goals Review  Weight Management/Obesity;Hypertension;Lipids       Review  Pt with few CAD RFs willing to participate in CR exercise. [place] would like to increase his energy.       Expected Outcomes  Pt will continue to particiapte in CR exercise, nutrition, and lifestyle modification opportunities.          Core Components/Risk Factors/Patient Goals at Discharge (Final Review):  Goals and Risk Factor Review - 07/16/19 0737      Core Components/Risk Factors/Patient Goals Review   Personal Goals Review  Weight Management/Obesity;Hypertension;Lipids    Review  Pt with few CAD RFs willing to participate in CR exercise. [place] would like to increase his energy.    Expected Outcomes  Pt will continue to particiapte in CR exercise, nutrition, and lifestyle modification opportunities.       ITP Comments: ITP Comments    Row Name 07/10/19 1427 07/16/19 0811         ITP Comments  Dr. Fransico Him, Medical Director  30 Day ITP Review. Pt started exercise today and tolerated it well.         Comments: See ITP Comments.

## 2019-07-20 ENCOUNTER — Encounter (HOSPITAL_COMMUNITY)
Admission: RE | Admit: 2019-07-20 | Discharge: 2019-07-20 | Disposition: A | Discharge status: Home / Self Care | Payer: Medicare Other | Source: Ambulatory Visit | Attending: Cardiovascular Disease | Admitting: Cardiovascular Disease

## 2019-07-20 ENCOUNTER — Encounter (HOSPITAL_COMMUNITY): Payer: Medicare Other

## 2019-07-20 ENCOUNTER — Other Ambulatory Visit: Payer: Self-pay

## 2019-07-20 DIAGNOSIS — Z951 Presence of aortocoronary bypass graft: Secondary | ICD-10-CM

## 2019-07-20 NOTE — Progress Notes (Signed)
Reviewed home exercise guidelines with patient including endpoints, temperature precautions, target heart rate and rate of perceived exertion. Pt is walking 40 minutes daily as his mode of home exercise. Pt voices understanding of instructions given.  Sol Passer, [place], ACSM Juanito Doom 07/20/2019 [phone]

## 2019-07-23 ENCOUNTER — Other Ambulatory Visit: Payer: Self-pay

## 2019-07-23 ENCOUNTER — Encounter (HOSPITAL_COMMUNITY)
Admission: RE | Admit: 2019-07-23 | Discharge: 2019-07-23 | Disposition: A | Discharge status: Home / Self Care | Payer: Medicare Other | Source: Ambulatory Visit | Attending: Cardiovascular Disease | Admitting: Cardiovascular Disease

## 2019-07-23 ENCOUNTER — Encounter (HOSPITAL_COMMUNITY): Payer: Medicare Other

## 2019-07-23 DIAGNOSIS — Z951 Presence of aortocoronary bypass graft: Secondary | ICD-10-CM

## 2019-07-24 ENCOUNTER — Telehealth: Payer: Self-pay | Admitting: Neurology

## 2019-07-24 NOTE — Telephone Encounter (Signed)
I called patient regarding rescheduling his 8/5 appointment due to Dr. Brett Fairy being out for vacation. 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 Reason for scheduling out to November not given. I rescheduled patient to 11/05/19 with Dr. Brett Fairy. New appointment card sent in mail.

## 2019-07-25 ENCOUNTER — Encounter (HOSPITAL_COMMUNITY)
Admission: RE | Admit: 2019-07-25 | Discharge: 2019-07-25 | Disposition: A | Discharge status: Home / Self Care | Payer: Medicare Other | Source: Ambulatory Visit | Attending: Cardiovascular Disease | Admitting: Cardiovascular Disease

## 2019-07-25 ENCOUNTER — Other Ambulatory Visit: Payer: Self-pay

## 2019-07-25 ENCOUNTER — Encounter (HOSPITAL_COMMUNITY): Payer: Medicare Other

## 2019-07-25 DIAGNOSIS — Z951 Presence of aortocoronary bypass graft: Secondary | ICD-10-CM

## 2019-07-25 DIAGNOSIS — H25011 Cortical age-related cataract, right eye: Secondary | ICD-10-CM | POA: Diagnosis not present

## 2019-07-25 DIAGNOSIS — H401112 Primary open-angle glaucoma, right eye, moderate stage: Secondary | ICD-10-CM | POA: Diagnosis not present

## 2019-07-25 DIAGNOSIS — H2511 Age-related nuclear cataract, right eye: Secondary | ICD-10-CM | POA: Diagnosis not present

## 2019-07-26 LAB — ECHO INTRAOPERATIVE TEE
AV Mean grad: 19 mmHg — NL
Ao-prox: 3.9 — NL
Height: 69 in — NL
IVS: 1.5 cm — AB (ref 0.6–1.1)
LVOT diameter: 23 mm — NL
Mean grad: 1 mmHg — NL
STJ: 2.6 cm — NL
Sinus: 3.2 cm — NL
Weight: 2924.82 oz — NL

## 2019-07-27 ENCOUNTER — Encounter (HOSPITAL_COMMUNITY): Payer: Medicare Other

## 2019-07-27 ENCOUNTER — Encounter (HOSPITAL_COMMUNITY)
Admission: RE | Admit: 2019-07-27 | Discharge: 2019-07-27 | Disposition: A | Discharge status: Home / Self Care | Payer: Medicare Other | Source: Ambulatory Visit | Attending: Cardiovascular Disease | Admitting: Cardiovascular Disease

## 2019-07-27 ENCOUNTER — Other Ambulatory Visit: Payer: Self-pay

## 2019-07-27 DIAGNOSIS — Z951 Presence of aortocoronary bypass graft: Secondary | ICD-10-CM

## 2019-07-30 ENCOUNTER — Ambulatory Visit: Payer: Medicare Other | Admitting: Nurse Practitioner

## 2019-07-30 ENCOUNTER — Other Ambulatory Visit: Payer: Self-pay

## 2019-07-30 ENCOUNTER — Encounter (HOSPITAL_COMMUNITY): Payer: Medicare Other

## 2019-07-30 ENCOUNTER — Encounter (HOSPITAL_COMMUNITY)
Admission: RE | Admit: 2019-07-30 | Discharge: 2019-07-30 | Disposition: A | Discharge status: Home / Self Care | Payer: Medicare Other | Source: Ambulatory Visit | Attending: Cardiovascular Disease | Admitting: Cardiovascular Disease

## 2019-07-30 DIAGNOSIS — Z951 Presence of aortocoronary bypass graft: Secondary | ICD-10-CM | POA: Insufficient documentation

## 2019-08-01 ENCOUNTER — Ambulatory Visit: Payer: Medicare Other | Admitting: Neurology

## 2019-08-01 ENCOUNTER — Encounter (HOSPITAL_COMMUNITY): Payer: Medicare Other

## 2019-08-01 ENCOUNTER — Encounter (HOSPITAL_COMMUNITY)
Admission: RE | Admit: 2019-08-01 | Discharge: 2019-08-01 | Disposition: A | Discharge status: Home / Self Care | Payer: Medicare Other | Source: Ambulatory Visit | Attending: Cardiovascular Disease | Admitting: Cardiovascular Disease

## 2019-08-01 ENCOUNTER — Other Ambulatory Visit: Payer: Self-pay

## 2019-08-01 DIAGNOSIS — Z951 Presence of aortocoronary bypass graft: Secondary | ICD-10-CM

## 2019-08-03 ENCOUNTER — Encounter (HOSPITAL_COMMUNITY)
Admission: RE | Admit: 2019-08-03 | Discharge: 2019-08-03 | Disposition: A | Discharge status: Home / Self Care | Payer: Medicare Other | Source: Ambulatory Visit | Attending: Cardiovascular Disease | Admitting: Cardiovascular Disease

## 2019-08-03 ENCOUNTER — Encounter (HOSPITAL_COMMUNITY): Payer: Medicare Other

## 2019-08-03 ENCOUNTER — Other Ambulatory Visit: Payer: Self-pay

## 2019-08-03 DIAGNOSIS — Z951 Presence of aortocoronary bypass graft: Secondary | ICD-10-CM

## 2019-08-06 ENCOUNTER — Encounter (HOSPITAL_COMMUNITY)
Admission: RE | Admit: 2019-08-06 | Discharge: 2019-08-06 | Disposition: A | Discharge status: Home / Self Care | Payer: Medicare Other | Source: Ambulatory Visit | Attending: Cardiovascular Disease | Admitting: Cardiovascular Disease

## 2019-08-06 ENCOUNTER — Encounter (HOSPITAL_COMMUNITY): Payer: Medicare Other

## 2019-08-06 ENCOUNTER — Other Ambulatory Visit: Payer: Self-pay

## 2019-08-06 DIAGNOSIS — Z951 Presence of aortocoronary bypass graft: Secondary | ICD-10-CM

## 2019-08-08 ENCOUNTER — Ambulatory Visit (INDEPENDENT_AMBULATORY_CARE_PROVIDER_SITE_OTHER): Payer: Self-pay | Admitting: Cardiothoracic Surgery

## 2019-08-08 ENCOUNTER — Encounter (HOSPITAL_COMMUNITY)
Admission: RE | Admit: 2019-08-08 | Discharge: 2019-08-08 | Disposition: A | Discharge status: Home / Self Care | Payer: Medicare Other | Source: Ambulatory Visit | Attending: Cardiovascular Disease | Admitting: Cardiovascular Disease

## 2019-08-08 ENCOUNTER — Encounter (HOSPITAL_COMMUNITY): Payer: Medicare Other

## 2019-08-08 ENCOUNTER — Other Ambulatory Visit: Payer: Self-pay

## 2019-08-08 ENCOUNTER — Encounter: Payer: Self-pay | Admitting: Cardiothoracic Surgery

## 2019-08-08 VITALS — BP 129/75 | HR 81 | Temp 97.9°F | Resp 16 | Ht 69.0 in | Wt 177.7 lb

## 2019-08-08 DIAGNOSIS — Z951 Presence of aortocoronary bypass graft: Secondary | ICD-10-CM

## 2019-08-08 DIAGNOSIS — I2510 Atherosclerotic heart disease of native coronary artery without angina pectoris: Secondary | ICD-10-CM

## 2019-08-10 ENCOUNTER — Encounter (HOSPITAL_COMMUNITY): Payer: Medicare Other

## 2019-08-10 ENCOUNTER — Encounter (HOSPITAL_COMMUNITY)
Admission: RE | Admit: 2019-08-10 | Discharge: 2019-08-10 | Disposition: A | Discharge status: Home / Self Care | Payer: Medicare Other | Source: Ambulatory Visit | Attending: Cardiovascular Disease | Admitting: Cardiovascular Disease

## 2019-08-10 ENCOUNTER — Other Ambulatory Visit: Payer: Self-pay

## 2019-08-10 DIAGNOSIS — Z951 Presence of aortocoronary bypass graft: Secondary | ICD-10-CM | POA: Diagnosis not present

## 2019-08-13 ENCOUNTER — Encounter (HOSPITAL_COMMUNITY): Payer: Medicare Other

## 2019-08-13 ENCOUNTER — Encounter (HOSPITAL_COMMUNITY)
Admission: RE | Admit: 2019-08-13 | Discharge: 2019-08-13 | Disposition: A | Discharge status: Home / Self Care | Payer: Medicare Other | Source: Ambulatory Visit | Attending: Cardiovascular Disease | Admitting: Cardiovascular Disease

## 2019-08-13 ENCOUNTER — Other Ambulatory Visit: Payer: Self-pay

## 2019-08-13 DIAGNOSIS — Z951 Presence of aortocoronary bypass graft: Secondary | ICD-10-CM

## 2019-08-14 DIAGNOSIS — Z23 Encounter for immunization: Secondary | ICD-10-CM | POA: Diagnosis not present

## 2019-08-15 ENCOUNTER — Encounter (HOSPITAL_COMMUNITY): Payer: Medicare Other

## 2019-08-15 ENCOUNTER — Other Ambulatory Visit: Payer: Self-pay

## 2019-08-15 ENCOUNTER — Encounter (HOSPITAL_COMMUNITY)
Admission: RE | Admit: 2019-08-15 | Discharge: 2019-08-15 | Disposition: A | Discharge status: Home / Self Care | Payer: Medicare Other | Source: Ambulatory Visit | Attending: Cardiovascular Disease | Admitting: Cardiovascular Disease

## 2019-08-15 DIAGNOSIS — Z951 Presence of aortocoronary bypass graft: Secondary | ICD-10-CM | POA: Diagnosis not present

## 2019-08-16 NOTE — Progress Notes (Signed)
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  apixaban (ELIQUIS) 5 MG TABS tablet, Take 1 tablet (5 mg total) by mouth 2 (two) times daily., Disp: 180 tablet, Rfl: 1 .  aspirin EC 81 MG tablet, Take 1 tablet (81 mg total) by mouth daily., Disp: 150 tablet, Rfl: 2 .  atorvastatin (LIPITOR) 20 MG tablet, Take 1 tablet (20 mg total) by mouth daily at 6 PM., Disp: 90 tablet, Rfl: 3 .  bimatoprost (LUMIGAN) 0.01 % SOLN, Place 1 drop into both eyes at bedtime., Disp: , Rfl:  .  Cholecalciferol 25 MCG (1000 UT) tablet, Take 1,000 Units by mouth daily. , Disp: , Rfl:  .  dorzolamide-timolol (COSOPT) 22.3-6.8 MG/ML ophthalmic solution, Place 1 drop into both eyes 2 (two) times daily. , Disp: , Rfl:  .  finasteride (PROSCAR) 5 MG tablet, Take 5 mg by mouth daily. , Disp: , Rfl:  .  fluticasone (FLONASE) 50 MCG/ACT nasal spray, Place 2 sprays into both nostrils 2 (two) times daily., Disp: , Rfl:  .  Fluticasone Propionate, Inhal, (FLOVENT DISKUS) 100 MCG/BLIST AEPB, Inhale 1 Inhaler into the lungs 2 (two) times daily. (Patient not taking: Reported on 08/08/2019), Disp: 60 each, Rfl: 1 .  metoprolol tartrate (LOPRESSOR) 25 MG tablet, Take 0.5 tablets (12.5 mg total) by mouth 2 (two) times daily., Disp: 30 tablet, Rfl: 3 .  MULTAQ 400 MG tablet, TAKE 1 TABLET BY MOUTH TWICE DAILY WITH MEALS (Patient taking differently: Take 400 mg by mouth 2 (two) times a day. ), Disp: 180 tablet, Rfl: 1 .  Multiple Vitamin  (MULTIVITAMIN WITH MINERALS) TABS tablet, Take 1 tablet by mouth daily., Disp: , Rfl:  .  pantoprazole (PROTONIX) 40 MG tablet, TAKE 1 TABLET BY MOUTH ONCE DAILY (Patient taking differently: Take 40 mg by mouth daily. ), Disp: 90 tablet, Rfl: 3 .  Polyethyl Glycol-Propyl Glycol (SYSTANE OP), Place 1 drop into both eyes daily as needed (dry eyes)., Disp: , Rfl:  .  tadalafil (CIALIS) 5 MG tablet, Take 5 mg by mouth at bedtime. , Disp: , Rfl:  .  triamterene-hydrochlorothiazide (MAXZIDE-25) 37.5-25 MG tablet, Take 1 tablet by mouth once daily, Disp: 90 tablet, Rfl: 3  Past Medical History: Past Medical History:  Diagnosis Date  . Anemia   . BENIGN PROSTATIC HYPERTROPHY, WITH OBSTRUCTION 05/28/2010  . CAD (coronary artery disease)    a. s/p NSTEMI 06/01/11: DES to RCA;  b. cath 06/25/11:   dLM 50-60% (FFR 0.87), prox to mid LAD 40-50%, D1 50%, pCFX 50%, RCA stent ok, dPDA 80%, EF 55-60%.  His FFR was felt to be negative and therefore medical therapy was recommended ;  echo 6/12: EF 55-60%, mild AS   . Chronic back pain   . 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 Colon cancer ([place]) dx'd 2000   "left"  . COLONIC POLYPS, ADENOMATOUS, HX OF 05/28/2010  . Complication of anesthesia    "stopped breathing; related to my  sleep apnea" & urinary retention   . COPD (chronic obstructive pulmonary disease) ([place])    "associated w/lung sarcoidosis"  . 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 DISH (diffuse idiopathic skeletal hyperostosis) 12/02/2015  . Diverticulitis   . GENERALIZED OSTEOARTHROSIS UNSPECIFIED SITE 05/28/2010  . GERD 05/28/2010  . Heart murmur   . History of gout   . History of kidney stones   . HYPERLIPIDEMIA 05/28/2010  . Hypertension   . IgM lambda paraproteinemia   . 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 Malignant neoplasm of descending colon ([place]) 05/28/2010  . MITRAL VALVE PROLAPSE 05/28/2010  . Monoclonal gammopathy of  undetermined significance 12/11/2011  . NSTEMI (non-ST elevated myocardial infarction) ([place]) 06/03/11  . 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  . OSA on CPAP 05/28/2010  . PALPITATIONS, CHRONIC 05/28/2010  . Parotid gland pain 2010   infection  . Peripheral neuropathy    "tx'd w/targeted chemo" (05/12/2016)  . Pneumonia 2-3 times  . Presence of permanent cardiac pacemaker   . 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 Sarcoidosis    pulmonalis  . UNSPECIFIED INFLAMMATORY AND TOXIC NEUROPATHY 05/28/2010  . Vision loss   . VITAMIN D DEFICIENCY 05/28/2010    Tobacco Use: Social History   Tobacco Use  Smoking Status Never Smoker  Smokeless Tobacco Never Used    Labs: Recent Review Flowsheet Data    Labs for ITP Cardiac and Pulmonary Rehab Latest Ref Rng & Units 06/05/2019 06/05/2019 06/07/2019 06/08/2019 06/08/2019   Cholestrol 100 - 199 mg/dL - - - - -   LDLCALC 0 - 99 mg/dL - - - - -   HDL >39 mg/dL - - - - -   Trlycerides 0 - 149 mg/dL - - - - -   Hemoglobin A1c 4.8 - 5.6 % - - - - -   PHART 7.350 - 7.450 7.277(L) - - - -   PCO2ART 32.0 - 48.0 mmHg 48.9(H) - - - -   HCO3 20.0 - 28.0 mmol/L 22.8 - - - -   TCO2 22 - 32 mmol/L 24 23 - - -   ACIDBASEDEF 0.0 - 2.0 mmol/L 4.0(H) - - - -   O2SAT % 91.0 - 63.1 54.0 58.3      Capillary Blood Glucose: Lab Results  Component Value Date   GLUCAP 97 06/11/2019   GLUCAP 127 (H) 06/10/2019   GLUCAP 88 06/10/2019   GLUCAP 115 (H) 06/10/2019   GLUCAP 93 06/10/2019     Exercise Target Goals: Exercise Program Goal: Individual exercise prescription set using results from initial 6 min walk test and THRR while considering  patient's activity barriers and safety.   Exercise Prescription Goal: Initial exercise prescription builds to 30-45 minutes a day of aerobic activity, 2-3 days per week.  Home exercise guidelines will be given to patient during program as part of exercise prescription that the participant will acknowledge.  Activity Barriers & Risk  Stratification: Activity Barriers & Cardiac Risk Stratification - 07/10/19 1526      Activity Barriers & Cardiac Risk Stratification   Activity Barriers  None    Cardiac Risk Stratification  High       6 Minute Walk: 6 Minute Walk    Row Name 07/10/19 1525         6 Minute Walk   Phase  Initial     Distance  1434 feet     Walk Time  6 minutes     # of Rest Breaks  0     MPH  2.72     METS  2.93     RPE  12     Perceived Dyspnea   0     VO2 Peak  10.2     Symptoms  No     Resting HR  77 bpm     Resting BP  108/60     Resting Oxygen Saturation   98 %     Exercise Oxygen Saturation  during 6 min walk  98 %     Max Ex. HR  113 bpm     Max Ex. BP  122/82     2 Minute Post BP  120/72        Oxygen Initial Assessment:   Oxygen Re-Evaluation:   Oxygen Discharge (Final Oxygen Re-Evaluation):   Initial Exercise Prescription: Initial Exercise Prescription - 07/10/19 1500      Date of Initial Exercise RX and Referring Provider   Date  07/10/19    Referring Provider  [name] MD    Expected Discharge Date  08/24/19      Treadmill   MPH  2    Grade  1    Minutes  15    METs  2.81      NuStep   Level  2    SPM  75    Minutes  15    METs  2.5      Prescription Details   Frequency (times per week)  3x    Duration  Progress to 30 minutes of continuous aerobic without signs/symptoms of physical distress      Intensity   THRR 40-80% of Max Heartrate  58-115    Ratings of Perceived Exertion  11-13    Perceived Dyspnea  0-4      Progression   Progression  Continue progressive overload as per policy without signs/symptoms or physical distress.      Resistance Training   Training Prescription  Yes    Weight  3lbs    Reps  10-15       Perform Capillary Blood Glucose checks as needed.  Exercise Prescription Changes: Exercise Prescription Changes    Row Name 07/16/19 0800 07/20/19 0714 08/13/19 0718         Response to Exercise   Blood Pressure  (Admit)  118/68  114/72  116/72     Blood Pressure (Exercise)  124/72  122/76  108/70     Blood Pressure (Exit)  122/72  126/72  104/60     Heart Rate (Admit)  92 bpm  82 bpm  82 bpm     Heart Rate (Exercise)  104 bpm  109 bpm  106 bpm     Heart Rate (Exit)  85 bpm  79 bpm  -     Rating of Perceived Exertion (Exercise)  12  12  13      Symptoms  none  none  none     Duration  Continue with 30 min of aerobic exercise without signs/symptoms of physical distress.  Continue with 30 min of aerobic exercise without signs/symptoms of physical distress.  Continue with 30 min of aerobic exercise without signs/symptoms of physical distress.     Intensity  THRR unchanged  THRR unchanged  THRR unchanged       Progression   Progression  Continue to progress workloads to maintain intensity without signs/symptoms of physical distress.  Continue to progress workloads to maintain intensity without signs/symptoms of physical distress.  Continue to progress workloads to maintain intensity without signs/symptoms of physical distress.     Average METs  2.8  3  4.4       Resistance Training   Training Prescription  Yes  Yes  Yes     Weight  3lbs  3lbs  5lbs     Reps  10-15  10-15  10-15     Time  10 Minutes  10 Minutes  10 Minutes       Interval Training   Interval Training  No  No  No       Treadmill   MPH  2  2.5  3     Grade  1  1  3      Minutes  15  15  15      METs  2.81  3.26  4.54       NuStep   Level  2  3  5      SPM  85  85  85     Minutes  15  15  15      METs  2.7  2.8  4.2       Home Exercise Plan   Plans to continue exercise at  Home (comment) Pt is walking 30-35 minutes daily.  Home (comment) Pt is walking 40 minutes daily.  Home (comment) Pt is walking 40 minutes daily.     Frequency  Add 4 additional days to program exercise sessions.  Add 4 additional days to program exercise sessions.  Add 4 additional days to program exercise sessions.     Initial Home Exercises Provided  -  07/20/19   07/20/19        Exercise Comments: Exercise Comments    Row Name 07/16/19 1031 07/20/19 0741 08/13/19 0735       Exercise Comments  Off to a good start with exercise. Pt is walking at home in addition to exercise at cardiac rehab. Reviewed goals with patient.  Reviewed home exercise guidelines with patient.  Reviewed goals with patient.        Exercise Goals and Review: Exercise Goals    Row Name 07/10/19 1529             Exercise Goals   Increase Physical Activity  Yes       Intervention  Provide advice, education, support and counseling about physical activity/exercise needs.;Develop an individualized exercise prescription for aerobic and resistive training based on initial evaluation findings, risk stratification, comorbidities and participant's personal goals.       Expected Outcomes  Short Term: Attend rehab on a regular basis to increase amount of physical activity.;Long Term: Add in home exercise to make exercise part of routine and to increase amount of physical activity.;Long Term: Exercising regularly at least 3-5 days a week.       Increase Strength and Stamina  Yes       Intervention  Provide advice, education, support and counseling about physical activity/exercise needs.;Develop an individualized exercise prescription for aerobic and resistive training based on initial evaluation findings, risk stratification, comorbidities and participant's personal goals.       Expected Outcomes  Short Term: Increase workloads from initial exercise prescription for resistance, speed, and METs.;Short Term: Perform resistance training exercises routinely during rehab and add in resistance training at home;Long Term: Improve cardiorespiratory fitness, muscular endurance and strength as measured by increased METs and functional capacity (6MWT)       Able to understand and use rate of perceived exertion (  RPE) scale  Yes       Intervention  Provide education and explanation on how to use RPE  scale       Expected Outcomes  Short Term: Able to use RPE daily in rehab to express subjective intensity level;Long Term:  Able to use RPE to guide intensity level when exercising independently       Knowledge and understanding of Target Heart Rate Range (THRR)  Yes       Intervention  Provide education and explanation of THRR including how the numbers were predicted and where they are located for reference       Expected Outcomes  Short Term: Able to state/look up THRR;Long Term: Able to use THRR to govern intensity when exercising independently;Short Term: Able to use daily as guideline for intensity in rehab       Able to check pulse independently  Yes       Intervention  Provide education and demonstration on how to check pulse in carotid and radial arteries.;Review the importance of being able to check your own pulse for safety during independent exercise       Expected Outcomes  Short Term: Able to explain why pulse checking is important during independent exercise;Long Term: Able to check pulse independently and accurately       Understanding of Exercise Prescription  Yes       Intervention  Provide education, explanation, and written materials on patient's individual exercise prescription       Expected Outcomes  Short Term: Able to explain program exercise prescription;Long Term: Able to explain home exercise prescription to exercise independently          Exercise Goals Re-Evaluation : Exercise Goals Re-Evaluation    Row Name 07/16/19 0841 07/20/19 0741 08/13/19 0732         Exercise Goal Re-Evaluation   Exercise Goals Review  Increase Physical Activity;Knowledge and understanding of Target Heart Rate Range (THRR);Able to understand and use rate of perceived exertion (RPE) scale;Able to check pulse independently  Increase Physical Activity;Knowledge and understanding of Target Heart Rate Range (THRR);Able to understand and use rate of perceived exertion (RPE) scale;Able to check pulse  independently;Understanding of Exercise Prescription  Increase Physical Activity;Knowledge and understanding of Target Heart Rate Range (THRR);Able to understand and use rate of perceived exertion (RPE) scale;Able to check pulse independently;Understanding of Exercise Prescription;Increase Strength and Stamina     Comments  Patient tolerated first session of exercise well without symptoms. Patient able to understand and use RPE scale appropriately. Reviewed target heart rate range with patient, and pt has a heart rate monitor to check pulse with. Pt is walking 30-35 minutes daily.  Reviewed home exercise guidelines with patient including THRR, RPE scale and endpoints for exercise. Pt is currently walking 40 minutes daily, ~2 miles, and has hand weights at home. Pt has HR monitor to check pulse.  Patient is progressing well with exercise. Pt is exercising by walking 2 miles daily, stretching, and resistance training using 5lbs hand weights at home. 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     Expected Outcomes  Increase workloads as tolerated to help achieve health and fitness goals.  Continue to progress workloads as tolerated to help achieve health and fitness goals.  Patient will continue daily exercise to continue to progress toward personal health and fitness goals.        Discharge Exercise Prescription (Final Exercise Prescription  Changes): Exercise Prescription Changes - 08/13/19 0718      Response to Exercise   Blood Pressure (Admit)  116/72    Blood Pressure (Exercise)  108/70    Blood Pressure (Exit)  104/60    Heart Rate (Admit)  82 bpm    Heart Rate (Exercise)  106 bpm    Rating of Perceived Exertion (Exercise)  13    Symptoms  none    Duration  Continue with 30 min of aerobic exercise without signs/symptoms of physical distress.    Intensity  THRR unchanged      Progression   Progression  Continue to progress workloads to  maintain intensity without signs/symptoms of physical distress.    Average METs  4.4      Resistance Training   Training Prescription  Yes    Weight  5lbs    Reps  10-15    Time  10 Minutes      Interval Training   Interval Training  No      Treadmill   MPH  3    Grade  3    Minutes  15    METs  4.54      NuStep   Level  5    SPM  85    Minutes  15    METs  4.2      Home Exercise Plan   Plans to continue exercise at  Home (comment)   Pt is walking 40 minutes daily.   Frequency  Add 4 additional days to program exercise sessions.    Initial Home Exercises Provided  07/20/19       Nutrition:  Target Goals: Understanding of nutrition guidelines, daily intake of sodium 1500mg , cholesterol 200mg , calories 30% from fat and 7% or less from saturated fats, daily to have 5 or more servings of fruits and vegetables.  Biometrics: Pre Biometrics - 07/10/19 1529      Pre Biometrics   Height  5\' 8"  (1.727 m)    Weight  79.4 kg    Waist Circumference  35 inches    Hip Circumference  39 inches    Waist to Hip Ratio  0.9 %    BMI (Calculated)  26.62    Triceps Skinfold  24 mm    % Body Fat  27 %    Grip Strength  32 kg    Flexibility  0 in    Single Leg Stand  3 seconds        Nutrition Therapy Plan and Nutrition Goals:   Nutrition Assessments:   Nutrition Goals Re-Evaluation:   Nutrition Goals Re-Evaluation:   Nutrition Goals Discharge (Final Nutrition Goals Re-Evaluation):   Psychosocial: Target Goals: Acknowledge presence or absence of significant depression and/or stress, maximize coping skills, provide positive support system. Participant is able to verbalize types and ability to use techniques and skills needed for reducing stress and depression.  Initial Review & Psychosocial Screening: Initial Psych Review & Screening - 07/10/19 1553      Initial Review   Current issues with  None Identified      Family Dynamics   Good Support System?  Yes    Derrick [name] has his wife and family for support     Barriers   Psychosocial barriers to participate in program  There are no identifiable barriers or psychosocial needs.      Screening Interventions   Interventions  Encouraged to exercise       Quality of Life Scores: Quality of  Life - 07/10/19 1531      Quality of Life   Select  Quality of Life      Quality of Life Scores   Health/Function Pre  25.2 %    Socioeconomic Pre  28.93 %    Psych/Spiritual Pre  30 %    Family Pre  27.6 %    GLOBAL Pre  27.31 %      Scores of 19 and below usually indicate a poorer quality of life in these areas.  A difference of  2-3 points is a clinically meaningful difference.  A difference of 2-3 points in the total score of the Quality of Life Index has been associated with significant improvement in overall quality of life, self-image, physical symptoms, and general health in studies assessing change in quality of life.  PHQ-9: Recent Review Flowsheet Data    Depression screen [facility] 2/9 07/16/2019 07/10/2019 06/13/2018 03/29/2017 02/10/2016   Decreased Interest 0 0 0 0 0   Down, Depressed, Hopeless 0 0 0 0 0   PHQ - 2 Score 0 0 0 0 0   Altered sleeping - - 1 - -   Tired, decreased energy - - 3 - -   Change in appetite - - 0 - -   Feeling bad or failure about yourself  - - 0 - -   Trouble concentrating - - 0 - -   Moving slowly or fidgety/restless - - 0 - -   Suicidal thoughts - - 0 - -   PHQ-9 Score - - 4 - -   Difficult doing work/chores - - Somewhat difficult - -     Interpretation of Total Score  Total Score Depression Severity:  1-4 = Minimal depression, 5-9 = Mild depression, 10-14 = Moderate depression, 15-19 = Moderately severe depression, 20-27 = Severe depression   Psychosocial Evaluation and Intervention: Psychosocial Evaluation - 07/16/19 0736      Psychosocial Evaluation & Interventions   Interventions  Encouraged to exercise with the program and follow exercise prescription     Comments  No psychosocial needs identified.  Jermayne enjoys fishing, Architect working, and gardening.    Expected Outcomes  Kimber will maintain a positive outlook with good coping skills.    Continue Psychosocial Services   No Follow up required       Psychosocial Re-Evaluation: Psychosocial Re-Evaluation    [place] Name 08/13/19 0831             Psychosocial Re-Evaluation   Current issues with  None Identified       Comments  No psychosocial interventions necessary.       Expected Outcomes  Derrick [name] will maintain a positive outlook with good coping skills.       Interventions  Encouraged to attend Cardiac Rehabilitation for the exercise       Continue Psychosocial Services   No Follow up required          Psychosocial Discharge (Final Psychosocial Re-Evaluation): Psychosocial Re-Evaluation - 08/13/19 0831      Psychosocial Re-Evaluation   Current issues with  None Identified    Comments  No psychosocial interventions necessary.    Expected Outcomes  Derrick [name] will maintain a positive outlook with good coping skills.    Interventions  Encouraged to attend Cardiac Rehabilitation for the exercise    Continue Psychosocial Services   No Follow up required       Vocational Rehabilitation: Provide vocational rehab assistance to qualifying candidates.   Vocational Rehab Evaluation &  Intervention: Vocational Rehab - 07/10/19 1555      Initial Vocational Rehab Evaluation & Intervention   Assessment shows need for Vocational Rehabilitation  No       Education: Education Goals: Education classes will be provided on a weekly basis, covering required topics. Participant will state understanding/return demonstration of topics presented.  Learning Barriers/Preferences: Learning Barriers/Preferences - 07/10/19 1532      Learning Barriers/Preferences   Learning Barriers  Sight    Learning Preferences  Written Material;Skilled Demonstration;Individual Instruction;Verbal Instruction        Education Topics: Count Your Pulse:  -Group instruction provided by verbal instruction, demonstration, patient participation and written materials to support subject.  Instructors address importance of being able to find your pulse and how to count your pulse when at home without a heart monitor.  Patients get hands on experience counting their pulse with staff help and individually.   Heart Attack, Angina, and Risk Factor Modification:  -Group instruction provided by verbal instruction, video, and written materials to support subject.  Instructors address signs and symptoms of angina and heart attacks.    Also discuss risk factors for heart disease and how to make changes to improve heart health risk factors.   Functional Fitness:  -Group instruction provided by verbal instruction, demonstration, patient participation, and written materials to support subject.  Instructors address safety measures for doing things around the house.  Discuss how to get up and down off the floor, how to pick things up properly, how to safely get out of a chair without assistance, and balance training.   Meditation and Mindfulness:  -Group instruction provided by verbal instruction, patient participation, and written materials to support subject.  Instructor addresses importance of mindfulness and meditation practice to help reduce stress and improve awareness.  Instructor also leads participants through a meditation exercise.    Stretching for Flexibility and Mobility:  -Group instruction provided by verbal instruction, patient participation, and written materials to support subject.  Instructors lead participants through series of stretches that are designed to increase flexibility thus improving mobility.  These stretches are additional exercise for major muscle groups that are typically performed during regular warm up and cool down.   Hands Only CPR:  -Group verbal, video, and participation provides a  basic overview of AHA guidelines for community CPR. Role-play of emergencies allow participants the opportunity to practice calling for help and chest compression technique with discussion of AED use.   Hypertension: -Group verbal and written instruction that provides a basic overview of hypertension including the most recent diagnostic guidelines, risk factor reduction with self-care instructions and medication management.    Nutrition I class: Heart Healthy Eating:  -Group instruction provided by PowerPoint slides, verbal discussion, and written materials to support subject matter. The instructor gives an explanation and review of the Therapeutic Lifestyle Changes diet recommendations, which includes a discussion on lipid goals, dietary fat, sodium, fiber, plant stanol/sterol esters, sugar, and the components of a well-balanced, healthy diet.   Nutrition II class: Lifestyle Skills:  -Group instruction provided by PowerPoint slides, verbal discussion, and written materials to support subject matter. The instructor gives an explanation and review of label reading, grocery shopping for heart health, heart healthy recipe modifications, and ways to make healthier choices when eating out.   Diabetes Question & Answer:  -Group instruction provided by PowerPoint slides, verbal discussion, and written materials to support subject matter. The instructor gives an explanation and review of diabetes co-morbidities, pre- and post-prandial blood glucose goals,  pre-exercise blood glucose goals, signs, symptoms, and treatment of hypoglycemia and hyperglycemia, and foot care basics.   Diabetes Blitz:  -Group instruction provided by PowerPoint slides, verbal discussion, and written materials to support subject matter. The instructor gives an explanation and review of the physiology behind type 1 and type 2 diabetes, diabetes medications and rational behind using different medications, pre- and post-prandial  blood glucose recommendations and Hemoglobin A1c goals, diabetes diet, and exercise including blood glucose guidelines for exercising safely.    Portion Distortion:  -Group instruction provided by PowerPoint slides, verbal discussion, written materials, and food models to support subject matter. The instructor gives an explanation of serving size versus portion size, changes in portions sizes over the last 20 years, and what consists of a serving from each food group.   Stress Management:  -Group instruction provided by verbal instruction, video, and written materials to support subject matter.  Instructors review role of stress in heart disease and how to cope with stress positively.     Exercising on Your Own:  -Group instruction provided by verbal instruction, power point, and written materials to support subject.  Instructors discuss benefits of exercise, components of exercise, frequency and intensity of exercise, and end points for exercise.  Also discuss use of nitroglycerin and activating EMS.  Review options of places to exercise outside of rehab.  Review guidelines for sex with heart disease.   Cardiac Drugs I:  -Group instruction provided by verbal instruction and written materials to support subject.  Instructor reviews cardiac drug classes: antiplatelets, anticoagulants, beta blockers, and statins.  Instructor discusses reasons, side effects, and lifestyle considerations for each drug class.   Cardiac Drugs II:  -Group instruction provided by verbal instruction and written materials to support subject.  Instructor reviews cardiac drug classes: angiotensin converting enzyme inhibitors (ACE-I), angiotensin II receptor blockers (ARBs), nitrates, and calcium channel blockers.  Instructor discusses reasons, side effects, and lifestyle considerations for each drug class.   Anatomy and Physiology of the Circulatory System:  Group verbal and written instruction and models provide basic  cardiac anatomy and physiology, with the coronary electrical and arterial systems. Review of: AMI, Angina, Valve disease, Heart Failure, Peripheral Artery Disease, Cardiac Arrhythmia, Pacemakers, and the ICD.   Other Education:  -Group or individual verbal, written, or video instructions that support the educational goals of the cardiac rehab program.   Holiday Eating Survival Tips:  -Group instruction provided by PowerPoint slides, verbal discussion, and written materials to support subject matter. The instructor gives patients tips, tricks, and techniques to help them not only survive but enjoy the holidays despite the onslaught of food that accompanies the holidays.   Knowledge Questionnaire Score: Knowledge Questionnaire Score - 07/10/19 1530      Knowledge Questionnaire Score   Pre Score  22/24       Core Components/Risk Factors/Patient Goals at Admission: Personal Goals and Risk Factors at Admission - 07/10/19 1532      Core Components/Risk Factors/Patient Goals on Admission    Weight Management  Weight Loss;Yes    Intervention  Weight Management: Develop a combined nutrition and exercise program designed to reach desired caloric intake, while maintaining appropriate intake of nutrient and fiber, sodium and fats, and appropriate energy expenditure required for the weight goal.;Weight Management: Provide education and appropriate resources to help participant work on and attain dietary goals.    Admit Weight  175 lb 0.7 oz (79.4 kg)    Goal Weight: Long Term  165 lb 5.5 oz (  75 kg)    Hypertension  Yes    Intervention  Provide education on lifestyle modifcations including regular physical activity/exercise, weight management, moderate sodium restriction and increased consumption of fresh fruit, vegetables, and low fat dairy, alcohol moderation, and smoking cessation.;Monitor prescription use compliance.    Expected Outcomes  Short Term: Continued assessment and intervention until BP  is < 140/59mm HG in hypertensive participants. < 130/38mm HG in hypertensive participants with diabetes, heart failure or chronic kidney disease.;Long Term: Maintenance of blood pressure at goal levels.    Lipids  Yes    Intervention  Provide education and support for participant on nutrition & aerobic/resistive exercise along with prescribed medications to achieve LDL 70mg , HDL >40mg .    Expected Outcomes  Short Term: Participant states understanding of desired cholesterol values and is compliant with medications prescribed. Participant is following exercise prescription and nutrition guidelines.;Long Term: Cholesterol controlled with medications as prescribed, with individualized exercise RX and with personalized nutrition plan. Value goals: LDL < 70mg , HDL > 40 mg.       Core Components/Risk Factors/Patient Goals Review:  Goals and Risk Factor Review    Row Name 07/16/19 0737 08/13/19 0831           Core Components/Risk Factors/Patient Goals Review   Personal Goals Review  Weight Management/Obesity;Hypertension;Lipids  Weight Management/Obesity;Hypertension;Lipids      Review  Pt with few CAD RFs willing to participate in CR exercise. Braven would like to increase his energy.  30 Day ITP Review. Derrick [name] continues to tolerate exercise well.  He feels that he is increasing his stamina.      Expected Outcomes  Pt will continue to particiapte in CR exercise, nutrition, and lifestyle modification opportunities.  Pt will continue to particiapte in CR exercise, nutrition, and lifestyle modification opportunities.         Core Components/Risk Factors/Patient Goals at Discharge (Final Review):  Goals and Risk Factor Review - 08/13/19 0831      Core Components/Risk Factors/Patient Goals Review   Personal Goals Review  Weight Management/Obesity;Hypertension;Lipids    Review  30 Day ITP Review. Derrick [name] continues to tolerate exercise well.  He feels that he is increasing his stamina.    Expected  Outcomes  Pt will continue to particiapte in CR exercise, nutrition, and lifestyle modification opportunities.       ITP Comments: ITP Comments    Row Name 07/10/19 1427 07/16/19 0811 08/13/19 0827       ITP Comments  Dr. Fransico Him, Medical Director  30 Day ITP Review. Pt started exercise today and tolerated it well.  30 Day ITP Review. Derrick [name] continues to tolerate exercise well.  He feels that he is increasing his stamina.        Comments: See ITP Comments.

## 2019-08-17 ENCOUNTER — Encounter (HOSPITAL_COMMUNITY)
Admission: RE | Admit: 2019-08-17 | Discharge: 2019-08-17 | Disposition: A | Discharge status: Home / Self Care | Payer: Medicare Other | Source: Ambulatory Visit | Attending: Cardiovascular Disease | Admitting: Cardiovascular Disease

## 2019-08-17 ENCOUNTER — Encounter (HOSPITAL_COMMUNITY): Payer: Medicare Other

## 2019-08-17 ENCOUNTER — Other Ambulatory Visit: Payer: Self-pay

## 2019-08-17 DIAGNOSIS — Z951 Presence of aortocoronary bypass graft: Secondary | ICD-10-CM | POA: Diagnosis not present

## 2019-08-20 ENCOUNTER — Encounter (HOSPITAL_COMMUNITY)
Admission: RE | Admit: 2019-08-20 | Discharge: 2019-08-20 | Disposition: A | Discharge status: Home / Self Care | Payer: Medicare Other | Source: Ambulatory Visit | Attending: Cardiovascular Disease | Admitting: Cardiovascular Disease

## 2019-08-20 ENCOUNTER — Encounter (HOSPITAL_COMMUNITY): Payer: Medicare Other

## 2019-08-20 ENCOUNTER — Other Ambulatory Visit: Payer: Self-pay

## 2019-08-20 DIAGNOSIS — Z951 Presence of aortocoronary bypass graft: Secondary | ICD-10-CM

## 2019-08-21 ENCOUNTER — Encounter: Payer: Self-pay | Admitting: Cardiothoracic Surgery

## 2019-08-21 NOTE — Progress Notes (Signed)
PCP is Derrick Sanders, MD Referring Provider is Derrick Sanders, MD  Chief Complaint  Patient presents with  . Routine Post Op    1 month f/u s/p CABG X 3...06/05/19    [id number] postop followup after CABG in June No recurrent angina Incisions healed Increased exercise tolerance   Past Medical History:  Diagnosis Date  . Anemia   . BENIGN PROSTATIC HYPERTROPHY, WITH OBSTRUCTION 05/28/2010  . CAD (coronary artery disease)    a. s/p NSTEMI 06/01/11: DES to RCA;  b. cath 06/25/11:   dLM 50-60% (FFR 0.87), prox to mid LAD 40-50%, D1 50%, pCFX 50%, RCA stent ok, dPDA 80%, EF 55-60%.  His FFR was felt to be negative and therefore medical therapy was recommended ;  echo 6/12: EF 55-60%, mild AS   . Chronic back pain   . 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 Colon cancer ([place]) dx'd 2000   "left"  . COLONIC POLYPS, ADENOMATOUS, HX OF 05/28/2010  . Complication of anesthesia    "stopped breathing; related to my sleep apnea" & urinary retention   . COPD (chronic obstructive pulmonary disease) ([place])    "associated w/lung sarcoidosis"  . 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 DISH (diffuse idiopathic skeletal hyperostosis) 12/02/2015  . Diverticulitis   . GENERALIZED OSTEOARTHROSIS UNSPECIFIED SITE 05/28/2010  . GERD 05/28/2010  . Heart murmur   . History of gout   . History of kidney stones   . HYPERLIPIDEMIA 05/28/2010  . Hypertension   . IgM lambda paraproteinemia   . 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 Malignant neoplasm of descending colon ([place]) 05/28/2010  . MITRAL VALVE PROLAPSE 05/28/2010  . 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 NSTEMI (non-ST elevated myocardial infarction) ([place]) 06/03/11  . 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  . OSA on CPAP 05/28/2010  . PALPITATIONS, CHRONIC 05/28/2010  . Parotid gland pain 2010   infection  . Peripheral neuropathy    "tx'd w/targeted chemo"  (05/12/2016)  . Pneumonia 2-3 times  . Presence of permanent cardiac pacemaker   . 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 Sarcoidosis    pulmonalis  . UNSPECIFIED INFLAMMATORY AND TOXIC NEUROPATHY 05/28/2010  . Vision loss   . VITAMIN D DEFICIENCY 05/28/2010    Past Surgical History:  Procedure Laterality Date  . APPENDECTOMY  1954  . CATARACT EXTRACTION W/ INTRAOCULAR LENS IMPLANT Left   . COLON SURGERY  2000   decending colon   . CORONARY ANGIOPLASTY WITH STENT PLACEMENT  06/03/11  . CORONARY ARTERY BYPASS GRAFT N/A 06/05/2019   Procedure: CORONARY ARTERY BYPASS GRAFTING (CABG) TIMES THREE USING LEFT INTERNAL MAMMARY ARTERY AND RIGHT GREATER SEPHANOUS VEIN;  Surgeon: [name], MD;  Location: [facility];  Service: Open Heart Surgery;  Laterality: N/A;  . ELECTROPHYSIOLOGIC STUDY N/A 05/12/2016   Procedure: Cardioversion;  Surgeon: [name], MD;  Location: [facility];  Service: Cardiovascular;  Laterality: N/A;  . EP IMPLANTABLE DEVICE N/A 05/12/2016   Procedure: Pacemaker Implant;  Surgeon: [name], MD;  Location: [facility];  Service: Cardiovascular;  Laterality: N/A;  . EYE SURGERY    . INGUINAL HERNIA REPAIR Left 02/21/2019   Procedure: LEFT INGUINAL HERNIA REPAIR WITH MESH;  Surgeon: [name], MD;  Location: [facility];  Service: General;  Laterality: Left;  . INSERT / REPLACE / REMOVE PACEMAKER  05/12/2016  . INTRAVASCULAR PRESSURE WIRE/FFR STUDY N/A 05/15/2019   Procedure: INTRAVASCULAR  PRESSURE WIRE/FFR STUDY;  Surgeon: [name], MD;  Location: [facility];  Service: Cardiovascular;  Laterality: N/A;  . KNEE ARTHROSCOPY Right 2003  . LEFT HEART CATH AND CORONARY ANGIOGRAPHY N/A 05/15/2019   Procedure: LEFT HEART CATH AND CORONARY ANGIOGRAPHY;  Surgeon: [name], MD;  Location: [facility];  Service: Cardiovascular;  Laterality: N/A;  . LEFT HEART CATHETERIZATION WITH CORONARY ANGIOGRAM N/A 03/31/2015   Procedure: LEFT HEART  CATHETERIZATION WITH CORONARY ANGIOGRAM;  Surgeon: [name], MD;  [facility];  Service: Cardiovascular;  Laterality: N/A;  . LUNG SURGERY  2001   "open lung dissection"  . MOHS SURGERY Left ~ 2008   ear  . PROSTATE BIOPSY  2010  . TEE WITHOUT CARDIOVERSION N/A 06/05/2019   Procedure: TRANSESOPHAGEAL ECHOCARDIOGRAM (TEE);  Surgeon: [name], [name], MD;  Location: [facility];  Service: Open Heart Surgery;  Laterality: N/A;  . TONSILLECTOMY  1950    Family History  Problem Relation Age of Onset  . Arthritis Mother        severe  . Coronary artery disease Mother   . Colon polyps Mother   . Heart disease Mother   . Coronary artery disease Brother   . Cancer Brother        choriocarcinoma  . Arrhythmia Brother 87       Afib/Tachycardia  . Heart attack Brother   . Congestive Heart Failure Brother     Social History Social History   Tobacco Use  . Smoking status: Never Smoker  . Smokeless tobacco: Never Used  Substance Use Topics  . Alcohol use: Yes    Comment: occasional  . Drug use: No    Current Outpatient Medications  Medication Sig Dispense Refill  . apixaban (ELIQUIS) 5 MG TABS tablet Take 1 tablet (5 mg total) by mouth 2 (two) times daily. 180 tablet 1  . aspirin EC 81 MG tablet Take 1 tablet (81 mg total) by mouth daily. 150 tablet 2  . atorvastatin (LIPITOR) 20 MG tablet Take 1 tablet (20 mg total) by mouth daily at 6 PM. 90 tablet 3  . bimatoprost (LUMIGAN) 0.01 % SOLN Place 1 drop into both eyes at bedtime.    . Cholecalciferol 25 MCG (1000 UT) tablet Take 1,000 Units by mouth daily.     . dorzolamide-timolol (COSOPT) 22.3-6.8 MG/ML ophthalmic solution Place 1 drop into both eyes 2 (two) times daily.     . finasteride (PROSCAR) 5 MG tablet Take 5 mg by mouth daily.     . fluticasone (FLONASE) 50 MCG/ACT nasal spray Place 2 sprays into both nostrils 2 (two) times daily.    . metoprolol tartrate (LOPRESSOR) 25 MG tablet Take 0.5 tablets (12.5 mg total) by  mouth 2 (two) times daily. 30 tablet 3  . MULTAQ 400 MG tablet TAKE 1 TABLET BY MOUTH TWICE DAILY WITH MEALS (Patient taking differently: Take 400 mg by mouth 2 (two) times a day. ) 180 tablet 1  . Multiple Vitamin (MULTIVITAMIN WITH MINERALS) TABS tablet Take 1 tablet by mouth daily.    . pantoprazole (PROTONIX) 40 MG tablet TAKE 1 TABLET BY MOUTH ONCE DAILY (Patient taking differently: Take 40 mg by mouth daily. ) 90 tablet 3  . Polyethyl Glycol-Propyl Glycol (SYSTANE OP) Place 1 drop into both eyes daily as needed (dry eyes).    . tadalafil (CIALIS) 5 MG tablet Take 5 mg by mouth at bedtime.     . triamterene-hydrochlorothiazide (MAXZIDE-25) 37.5-25 MG tablet Take  1 tablet by mouth once daily 90 tablet 3  . Fluticasone Propionate, Inhal, (FLOVENT DISKUS) 100 MCG/BLIST AEPB Inhale 1 Inhaler into the lungs 2 (two) times daily. (Patient not taking: Reported on 08/08/2019) 60 each 1   No current facility-administered medications for this visit.     No Known Allergies  Review of Systems  No fever Weight stable  BP 129/75 (BP Location: Right Arm, Patient Position: Sitting, Cuff Size: Normal)   Pulse 81   Temp 97.9 F (36.6 C)   Resp 16   Ht 5\' 9"  (1.753 m)   Wt 177 lb 11.1 oz (80.6 kg)   SpO2 97% Comment: RA  BMI 26.24 kg/m  Physical Exam      Exam    General- alert and comfortable    Neck- no JVD, no cervical adenopathy palpable, no carotid bruit   Lungs- clear without rales, wheezes   Cor- regular rate and rhythm, no murmur , gallop   Abdomen- soft, non-tender   Extremities - warm, non-tender, minimal edema   Neuro- oriented, appropriate, no focal weakness   Diagnostic Tests:  Last CXR clear Impression: Doing well- recovered from CABG  Plan: Finish cardiac rehab Heart healthy diet and lifestyle reviewed with the patrient  Len Childs, MD [facility] [phone]

## 2019-08-22 ENCOUNTER — Encounter (HOSPITAL_COMMUNITY): Payer: Medicare Other

## 2019-08-22 ENCOUNTER — Encounter (HOSPITAL_COMMUNITY)
Admission: RE | Admit: 2019-08-22 | Discharge: 2019-08-22 | Disposition: A | Discharge status: Home / Self Care | Payer: Medicare Other | Source: Ambulatory Visit | Attending: Cardiovascular Disease | Admitting: Cardiovascular Disease

## 2019-08-22 ENCOUNTER — Other Ambulatory Visit: Payer: Self-pay

## 2019-08-22 DIAGNOSIS — Z951 Presence of aortocoronary bypass graft: Secondary | ICD-10-CM

## 2019-08-23 ENCOUNTER — Other Ambulatory Visit: Payer: Self-pay | Admitting: Family

## 2019-08-24 ENCOUNTER — Encounter (HOSPITAL_COMMUNITY)
Admission: RE | Admit: 2019-08-24 | Discharge: 2019-08-24 | Disposition: A | Discharge status: Home / Self Care | Payer: Medicare Other | Source: Ambulatory Visit | Attending: Cardiovascular Disease | Admitting: Cardiovascular Disease

## 2019-08-24 ENCOUNTER — Encounter (HOSPITAL_COMMUNITY): Payer: Medicare Other

## 2019-08-24 ENCOUNTER — Other Ambulatory Visit: Payer: Self-pay

## 2019-08-24 VITALS — BP 104/60 | HR 80 | Temp 97.9°F | Ht 68.0 in | Wt 179.5 lb

## 2019-08-24 DIAGNOSIS — Z951 Presence of aortocoronary bypass graft: Secondary | ICD-10-CM

## 2019-08-24 NOTE — Progress Notes (Signed)
Discharge Progress Report  Patient Details  Name: Derrick [name] MRN: [id number] Date of Birth: December 09, 1941 Referring Provider:     [facility] from 07/10/2019 in [place]  Referring Provider  [name] MD       Number of Visits: 18  Reason for Discharge:  Patient reached a stable level of exercise. Patient independent in their exercise. Patient has met program and personal goals.  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  ADL UCSD:   Initial Exercise Prescription: Initial Exercise Prescription - 07/10/19 1500      Date of Initial Exercise RX and Referring Provider   Date  07/10/19    Referring Provider  [name] MD    Expected Discharge Date  08/24/19      Treadmill   MPH  2    Grade  1    Minutes  15    METs  2.81      NuStep   Level  2    SPM  75    Minutes  15    METs  2.5      Prescription Details   Frequency (times per week)  3x    Duration  Progress to 30 minutes of continuous aerobic without signs/symptoms of physical distress      Intensity   THRR 40-80% of Max Heartrate  58-115    Ratings of Perceived Exertion  11-13    Perceived Dyspnea  0-4      Progression   Progression  Continue progressive overload as per policy without signs/symptoms or physical distress.      Resistance Training   Training Prescription  Yes    Weight  3lbs    Reps  10-15       Discharge Exercise Prescription (Final Exercise Prescription Changes): Exercise Prescription Changes - 08/24/19 0800      Response to Exercise   Blood Pressure (Admit)  104/60    Blood Pressure (Exercise)  118/78    Blood Pressure (Exit)  100/64    Heart Rate (Admit)  80 bpm    Heart Rate (Exercise)  119 bpm    Heart Rate (Exit)  85 bpm    Rating of Perceived Exertion (Exercise)  13    Symptoms  none    Duration   Continue with 30 min of aerobic exercise without signs/symptoms of physical distress.    Intensity  THRR unchanged      Progression   Progression  Continue to progress workloads to maintain intensity without signs/symptoms of physical distress.    Average METs  4.6      Resistance Training   Training Prescription  Yes    Weight  5lbs    Reps  10-15    Time  10 Minutes      Interval Training   Interval Training  No      Treadmill   MPH  3.2    Grade  3    Minutes  15    METs  4.77      NuStep   Level  5    SPM  85    Minutes  15    METs  4.5      Home Exercise Plan   Plans to continue exercise at  Home (comment)   Pt is walking 40 minutes daily.  Frequency  Add 4 additional days to program exercise sessions.    Initial Home Exercises Provided  07/20/19       Functional Capacity: 6 Minute Walk    Row Name 07/10/19 1525 08/20/19 0716       6 Minute Walk   Phase  Initial  Discharge    Distance  1434 feet  1409 feet    Distance % Change  -  1.74 %    Walk Time  6 minutes  6 minutes    # of Rest Breaks  0  0    MPH  2.72  2.67    METS  2.93  2.74    RPE  12  12    Perceived Dyspnea   0  0    VO2 Peak  10.2  9.58    Symptoms  No  No    Resting HR  77 bpm  80 bpm    Resting BP  108/60  114/68    Resting Oxygen Saturation   98 %  -    Exercise Oxygen Saturation  during 6 min walk  98 %  -    Max Ex. HR  113 bpm  99 bpm    Max Ex. BP  122/82  124/80    2 Minute Post BP  120/72  96/60       Psychological, QOL, Others - Outcomes: PHQ 2/9: Depression screen [facility] 2/9 08/22/2019 07/16/2019 07/10/2019 06/13/2018 03/29/2017  Decreased Interest 0 0 0 0 0  Down, Depressed, Hopeless 0 0 0 0 0  PHQ - 2 Score 0 0 0 0 0  Altered sleeping - - - 1 -  Tired, decreased energy - - - 3 -  Change in appetite - - - 0 -  Feeling bad or failure about yourself  - - - 0 -  Trouble concentrating - - - 0 -  Moving slowly or fidgety/restless - - - 0 -  Suicidal thoughts - - - 0 -   PHQ-9 Score - - - 4 -  Difficult doing work/chores - - - Somewhat difficult -  Some recent data might be hidden    Quality of Life: Quality of Life - 08/20/19 1340      Quality of Life   Select  Quality of Life      Quality of Life Scores   Health/Function Pre  25.2 %    Health/Function Post  27.8 %    Health/Function % Change  10.32 %    Socioeconomic Pre  28.93 %    Socioeconomic Post  28.21 %    Socioeconomic % Change   -2.49 %    Psych/Spiritual Pre  30 %    Psych/Spiritual Post  30 %    Psych/Spiritual % Change  0 %    Family Pre  27.6 %    Family Post  30 %    Family % Change  8.7 %    GLOBAL Pre  27.31 %    GLOBAL Post  28.66 %    GLOBAL % Change  4.94 %       Personal Goals: Goals established at orientation with interventions provided to work toward goal. Personal Goals and Risk Factors at Admission - 07/10/19 1532      Core Components/Risk Factors/Patient Goals on Admission    Weight Management  Weight Loss;Yes    Intervention  Weight Management: Develop a combined nutrition and exercise program designed to reach desired caloric intake, while  maintaining appropriate intake of nutrient and fiber, sodium and fats, and appropriate energy expenditure required for the weight goal.;Weight Management: Provide education and appropriate resources to help participant work on and attain dietary goals.    Admit Weight  175 lb 0.7 oz (79.4 kg)    Goal Weight: Long Term  165 lb 5.5 oz (75 kg)    Hypertension  Yes    Intervention  Provide education on lifestyle modifcations including regular physical activity/exercise, weight management, moderate sodium restriction and increased consumption of fresh fruit, vegetables, and low fat dairy, alcohol moderation, and smoking cessation.;Monitor prescription use compliance.    Expected Outcomes  Short Term: Continued assessment and intervention until BP is < 140/29m HG in hypertensive participants. < 130/849mHG in hypertensive  participants with diabetes, heart failure or chronic kidney disease.;Long Term: Maintenance of blood pressure at goal levels.    Lipids  Yes    Intervention  Provide education and support for participant on nutrition & aerobic/resistive exercise along with prescribed medications to achieve LDL <7094mHDL >19m45m  Expected Outcomes  Short Term: Participant states understanding of desired cholesterol values and is compliant with medications prescribed. Participant is following exercise prescription and nutrition guidelines.;Long Term: Cholesterol controlled with medications as prescribed, with individualized exercise RX and with personalized nutrition plan. Value goals: LDL < 70mg69mL > 40 mg.        Personal Goals Discharge: Goals and Risk Factor Review    Row Name 07/16/19 0737 08/13/19 0831 08/24/19 0720         Core Components/Risk Factors/Patient Goals Review   Personal Goals Review  Weight Management/Obesity;Hypertension;Lipids  Weight Management/Obesity;Hypertension;Lipids  Weight Management/Obesity;Hypertension;Lipids     Review  Pt with few CAD RFs willing to participate in CR exercise. Greyson would like to increase his energy.  30 Day ITP Review. Gary Dominica Severininues to tolerate exercise well.  He feels that he is increasing his stamina.  Gary Dominica Severinuated from [facility] with 18 complete sessions.  He continued to increase his stamina during the program.  He had perfect attendance throughout the program.     Expected Outcomes  Pt will continue to particiapte in CR exercise, nutrition, and lifestyle modification opportunities.  Pt will continue to particiapte in CR exercise, nutrition, and lifestyle modification opportunities.  Pt will continue to particiapte in exercise, nutrition, and lifestyle modification opportunities.  Gary Dominica Severins to continue walking in his neighborhood.        Exercise Goals and Review: Exercise Goals    Row Name 07/10/19 1529             Exercise Goals   Increase  Physical Activity  Yes       Intervention  Provide advice, education, support and counseling about physical activity/exercise needs.;Develop an individualized exercise prescription for aerobic and resistive training based on initial evaluation findings, risk stratification, comorbidities and participant's personal goals.       Expected Outcomes  Short Term: Attend rehab on a regular basis to increase amount of physical activity.;Long Term: Add in home exercise to make exercise part of routine and to increase amount of physical activity.;Long Term: Exercising regularly at least 3-5 days a week.       Increase Strength and Stamina  Yes       Intervention  Provide advice, education, support and counseling about physical activity/exercise needs.;Develop an individualized exercise prescription for aerobic and resistive training based on initial evaluation findings, risk stratification, comorbidities and participant's personal goals.  Expected Outcomes  Short Term: Increase workloads from initial exercise prescription for resistance, speed, and METs.;Short Term: Perform resistance training exercises routinely during rehab and add in resistance training at home;Long Term: Improve cardiorespiratory fitness, muscular endurance and strength as measured by increased METs and functional capacity (6MWT)       Able to understand and use rate of perceived exertion (RPE) scale  Yes       Intervention  Provide education and explanation on how to use RPE scale       Expected Outcomes  Short Term: Able to use RPE daily in rehab to express subjective intensity level;Long Term:  Able to use RPE to guide intensity level when exercising independently       Knowledge and understanding of Target Heart Rate Range (THRR)  Yes       Intervention  Provide education and explanation of THRR including how the numbers were predicted and where they are located for reference       Expected Outcomes  Short Term: Able to state/look up  THRR;Long Term: Able to use THRR to govern intensity when exercising independently;Short Term: Able to use daily as guideline for intensity in rehab       Able to check pulse independently  Yes       Intervention  Provide education and demonstration on how to check pulse in carotid and radial arteries.;Review the importance of being able to check your own pulse for safety during independent exercise       Expected Outcomes  Short Term: Able to explain why pulse checking is important during independent exercise;Long Term: Able to check pulse independently and accurately       Understanding of Exercise Prescription  Yes       Intervention  Provide education, explanation, and written materials on patient's individual exercise prescription       Expected Outcomes  Short Term: Able to explain program exercise prescription;Long Term: Able to explain home exercise prescription to exercise independently          Exercise Goals Re-Evaluation: Exercise Goals Re-Evaluation    Row Name 07/16/19 0841 07/20/19 0741 08/13/19 0732 08/24/19 0800       Exercise Goal Re-Evaluation   Exercise Goals Review  Increase Physical Activity;Knowledge and understanding of Target Heart Rate Range (THRR);Able to understand and use rate of perceived exertion (RPE) scale;Able to check pulse independently  Increase Physical Activity;Knowledge and understanding of Target Heart Rate Range (THRR);Able to understand and use rate of perceived exertion (RPE) scale;Able to check pulse independently;Understanding of Exercise Prescription  Increase Physical Activity;Knowledge and understanding of Target Heart Rate Range (THRR);Able to understand and use rate of perceived exertion (RPE) scale;Able to check pulse independently;Understanding of Exercise Prescription;Increase Strength and Stamina  Increase Physical Activity;Knowledge and understanding of Target Heart Rate Range (THRR);Able to understand and use rate of perceived exertion (RPE)  scale;Able to check pulse independently;Understanding of Exercise Prescription;Increase Strength and Stamina    Comments  Patient tolerated first session of exercise well without symptoms. Patient able to understand and use RPE scale appropriately. Reviewed target heart rate range with patient, and pt has a heart rate monitor to check pulse with. Pt is walking 30-35 minutes daily.  Reviewed home exercise guidelines with patient including THRR, RPE scale and endpoints for exercise. Pt is currently walking 40 minutes daily, ~2 miles, and has hand weights at home. Pt has HR monitor to check pulse.  Patient is progressing well with exercise. Pt is exercising by walking  2 miles daily, stretching, and resistance training using 5lbs hand weights at home. 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  Patient completed the phase 2 cadiac rehab and progressed well, averaging 4.6 METs with exercise. Pt is walking 45-60 minutes daily as his mode of home exercise and also plans to resume biking.    Expected Outcomes  Increase workloads as tolerated to help achieve health and fitness goals.  Continue to progress workloads as tolerated to help achieve health and fitness goals.  Patient will continue daily exercise to continue to progress toward personal health and fitness goals.  Patient will continue daily exercise routine: walking and biking to help maintain health and fitness gains.       Nutrition & Weight - Outcomes: Pre Biometrics - 08/20/19 0725      Pre Biometrics   Waist Circumference  35.5 inches    Hip Circumference  39.5 inches    Waist to Hip Ratio  0.9 %    Triceps Skinfold  12 mm    Grip Strength  33.5 kg    Flexibility  0 in    Single Leg Stand  3.93 seconds      Post Biometrics - 08/24/19 1239       Post  Biometrics   Height  '5\' 8"'  (1.727 m)    Weight  81.4 kg    Waist Circumference  35.5 inches    Hip Circumference  39.5 inches     Waist to Hip Ratio  0.9 %    BMI (Calculated)  27.29    Triceps Skinfold  12 mm    % Body Fat  24.6 %    Grip Strength  33.5 kg    Flexibility  0 in    Single Leg Stand  3.93 seconds       Nutrition:   Nutrition Discharge:   Education Questionnaire Score: Knowledge Questionnaire Score - 08/20/19 1340      Knowledge Questionnaire Score   Pre Score  22/24    Post Score  23/24       Goals reviewed with patient; copy given to patient.

## 2019-09-06 ENCOUNTER — Other Ambulatory Visit: Payer: Self-pay | Admitting: Hematology and Oncology

## 2019-09-06 ENCOUNTER — Telehealth: Payer: Self-pay | Admitting: *Deleted

## 2019-09-06 DIAGNOSIS — D472 Monoclonal gammopathy: Secondary | ICD-10-CM

## 2019-09-06 NOTE — Telephone Encounter (Signed)
lvm R/S pt's appt due to appt being scheduled on a [place] day.  Pt has upcoming appt with Dr. Curt Bears on 9/22 if pt cannot keep the [facility] appt pt can R/S on the day pt is in office.

## 2019-09-07 ENCOUNTER — Other Ambulatory Visit: Payer: Self-pay

## 2019-09-07 ENCOUNTER — Inpatient Hospital Stay: Payer: Medicare Other | Attending: Hematology and Oncology

## 2019-09-07 DIAGNOSIS — Z7982 Long term (current) use of aspirin: Secondary | ICD-10-CM | POA: Insufficient documentation

## 2019-09-07 DIAGNOSIS — Z79899 Other long term (current) drug therapy: Secondary | ICD-10-CM | POA: Diagnosis not present

## 2019-09-07 DIAGNOSIS — Z7901 Long term (current) use of anticoagulants: Secondary | ICD-10-CM | POA: Diagnosis not present

## 2019-09-07 DIAGNOSIS — I10 Essential (primary) hypertension: Secondary | ICD-10-CM | POA: Diagnosis not present

## 2019-09-07 DIAGNOSIS — G6181 Chronic inflammatory demyelinating polyneuritis: Secondary | ICD-10-CM | POA: Diagnosis not present

## 2019-09-07 DIAGNOSIS — D472 Monoclonal gammopathy: Secondary | ICD-10-CM | POA: Diagnosis not present

## 2019-09-07 DIAGNOSIS — I4891 Unspecified atrial fibrillation: Secondary | ICD-10-CM | POA: Diagnosis not present

## 2019-09-07 DIAGNOSIS — Z7951 Long term (current) use of inhaled steroids: Secondary | ICD-10-CM | POA: Insufficient documentation

## 2019-09-07 DIAGNOSIS — E785 Hyperlipidemia, unspecified: Secondary | ICD-10-CM | POA: Diagnosis not present

## 2019-09-07 LAB — CBC WITH DIFFERENTIAL/PLATELET
Abs Immature Granulocytes: 0 10*3/uL (ref 0.00–0.07)
Basophils Absolute: 0 10*3/uL (ref 0.0–0.1)
Basophils Relative: 1 %
Eosinophils Absolute: 0.1 10*3/uL (ref 0.0–0.5)
Eosinophils Relative: 3 %
HCT: 33.6 % — ABNORMAL LOW (ref 39.0–52.0)
Hemoglobin: 10.5 g/dL — ABNORMAL LOW (ref 13.0–17.0)
Immature Granulocytes: 0 %
Lymphocytes Relative: 21 %
Lymphs Abs: 0.8 10*3/uL (ref 0.7–4.0)
MCH: 26.8 pg (ref 26.0–34.0)
MCHC: 31.3 g/dL (ref 30.0–36.0)
MCV: 85.7 fL (ref 80.0–100.0)
Monocytes Absolute: 0.5 10*3/uL (ref 0.1–1.0)
Monocytes Relative: 12 %
Neutro Abs: 2.4 10*3/uL (ref 1.7–7.7)
Neutrophils Relative %: 63 %
Platelets: 192 10*3/uL (ref 150–400)
RBC: 3.92 MIL/uL — ABNORMAL LOW (ref 4.22–5.81)
RDW: 13 % (ref 11.5–15.5)
WBC: 3.9 10*3/uL — ABNORMAL LOW (ref 4.0–10.5)
nRBC: 0 % (ref 0.0–0.2)

## 2019-09-07 LAB — COMPREHENSIVE METABOLIC PANEL
ALT: 13 U/L (ref 0–44)
AST: 21 U/L (ref 15–41)
Albumin: 4 g/dL (ref 3.5–5.0)
Alkaline Phosphatase: 65 U/L (ref 38–126)
Anion gap: 6 (ref 5–15)
BUN: 16 mg/dL (ref 8–23)
CO2: 28 mmol/L (ref 22–32)
Calcium: 9.9 mg/dL (ref 8.9–10.3)
Chloride: 104 mmol/L (ref 98–111)
Creatinine, Ser: 1.01 mg/dL (ref 0.61–1.24)
GFR calc Af Amer: >60 mL/min (ref >60)
GFR calc non Af Amer: >60 mL/min (ref >60)
Glucose, Bld: 96 mg/dL (ref 70–99)
Potassium: 3.9 mmol/L (ref 3.5–5.1)
Sodium: 138 mmol/L (ref 135–145)
Total Bilirubin: 0.4 mg/dL (ref 0.3–1.2)
Total Protein: 6.8 g/dL (ref 6.5–8.1)

## 2019-09-09 LAB — KAPPA/LAMBDA LIGHT CHAINS
Kappa free light chain: 28.1 mg/L — ABNORMAL HIGH (ref 3.3–19.4)
Kappa, lambda light chain ratio: 1.49 (ref 0.26–1.65)
Lambda free light chains: 18.9 mg/L (ref 5.7–26.3)

## 2019-09-10 LAB — MULTIPLE MYELOMA PANEL, SERUM
Albumin SerPl Elph-Mcnc: 3.7 g/dL (ref 2.9–4.4)
Albumin/Glob SerPl: 1.5 (ref 0.7–1.7)
Alpha 1: 0.2 g/dL (ref 0.0–0.4)
Alpha2 Glob SerPl Elph-Mcnc: 0.7 g/dL (ref 0.4–1.0)
B-Globulin SerPl Elph-Mcnc: 0.8 g/dL (ref 0.7–1.3)
Gamma Glob SerPl Elph-Mcnc: 0.8 g/dL (ref 0.4–1.8)
Globulin, Total: 2.5 g/dL (ref 2.2–3.9)
IgA: 210 mg/dL (ref 61–437)
IgG (Immunoglobin G), Serum: 760 mg/dL (ref 603–1613)
IgM (Immunoglobulin M), Srm: 369 mg/dL — ABNORMAL HIGH (ref 15–143)
Total Protein ELP: 6.2 g/dL (ref 6.0–8.5)

## 2019-09-11 DIAGNOSIS — M150 Primary generalized (osteo)arthritis: Secondary | ICD-10-CM | POA: Diagnosis not present

## 2019-09-11 DIAGNOSIS — E663 Overweight: Secondary | ICD-10-CM | POA: Diagnosis not present

## 2019-09-11 DIAGNOSIS — M5489 Other dorsalgia: Secondary | ICD-10-CM | POA: Diagnosis not present

## 2019-09-11 DIAGNOSIS — M2550 Pain in unspecified joint: Secondary | ICD-10-CM | POA: Diagnosis not present

## 2019-09-11 DIAGNOSIS — M25561 Pain in right knee: Secondary | ICD-10-CM | POA: Diagnosis not present

## 2019-09-11 DIAGNOSIS — Z6826 Body mass index (BMI) 26.0-26.9, adult: Secondary | ICD-10-CM | POA: Diagnosis not present

## 2019-09-14 ENCOUNTER — Other Ambulatory Visit: Payer: Self-pay

## 2019-09-14 ENCOUNTER — Encounter: Payer: Self-pay | Admitting: Hematology and Oncology

## 2019-09-14 ENCOUNTER — Inpatient Hospital Stay (HOSPITAL_BASED_OUTPATIENT_CLINIC_OR_DEPARTMENT_OTHER): Payer: Medicare Other | Admitting: Hematology and Oncology

## 2019-09-14 DIAGNOSIS — Z7901 Long term (current) use of anticoagulants: Secondary | ICD-10-CM | POA: Diagnosis not present

## 2019-09-14 DIAGNOSIS — I208 Other forms of angina pectoris: Secondary | ICD-10-CM | POA: Diagnosis not present

## 2019-09-14 DIAGNOSIS — E785 Hyperlipidemia, unspecified: Secondary | ICD-10-CM | POA: Diagnosis not present

## 2019-09-14 DIAGNOSIS — G6181 Chronic inflammatory demyelinating polyneuritis: Secondary | ICD-10-CM | POA: Diagnosis not present

## 2019-09-14 DIAGNOSIS — D472 Monoclonal gammopathy: Secondary | ICD-10-CM

## 2019-09-14 DIAGNOSIS — G6289 Other specified polyneuropathies: Secondary | ICD-10-CM

## 2019-09-14 DIAGNOSIS — I10 Essential (primary) hypertension: Secondary | ICD-10-CM | POA: Diagnosis not present

## 2019-09-14 DIAGNOSIS — I4891 Unspecified atrial fibrillation: Secondary | ICD-10-CM | POA: Diagnosis not present

## 2019-09-14 NOTE — Assessment & Plan Note (Signed)
He has negative lymphoma work-up and negative bone marrow biopsy for multiple myeloma His myeloma panel is unremarkable The polyclonal abnormality seen is likely related to CIDP and sometimes a trigger abnormality seen at the M spike region Overall, I do not believe he have MGUS I recommend discontinuation of follow-up

## 2019-09-14 NOTE — Assessment & Plan Note (Signed)
He has chronic neuropathy of unknown etiology and this is not related to MGUS I will discharge him and recommend close follow-up with his neurologist

## 2019-09-14 NOTE — Progress Notes (Signed)
[place] OFFICE PROGRESS NOTE  Patient Care Team: [name], MD as PCP - General (Family Medicine) [name], MD as PCP - Electrophysiology (Cardiology) [name], MD as PCP - Cardiology (Cardiology) Heath Lark, MD as Consulting Physician (Hematology and Oncology)  ASSESSMENT & PLAN:  MGUS (monoclonal gammopathy of unknown significance) He has negative lymphoma work-up and negative bone marrow biopsy for multiple myeloma His myeloma panel is unremarkable The polyclonal abnormality seen is likely related to CIDP and sometimes a trigger abnormality seen at the M spike region Overall, I do not believe he have MGUS I recommend discontinuation of follow-up  CIDP (chronic inflammatory demyelinating polyneuropathy) I will defer management to his rheumatologist His recent CT imaging study showed no evidence to suggest myeloma  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   INTERVAL HISTORY: Please see below for problem oriented charting. He returns for further follow-up His neuropathy is stable He had recent cardiac procedure in June but overall, he is recovering well from surgery No recent bleeding or infection  SUMMARY OF ONCOLOGIC HISTORY:  Derrick [name] was transferred to my care after his prior physician has left.  I reviewed the patient's records extensive and collaborated the history with the patient. 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 He was initially evaluated and treated by a hematologist and a neurologist in [place] before he moved to [place]. Subsequent to the diagnosis of CIDP he was diagnosed with  sarcoidosis based on an open lung biopsy done back in March of 2011. He had bone marrow aspirate and biopsy which showed no evidence of malignancy in his bone marrow. His neurologic condition is characterized primarily by painful distal neuropathy, feet worse than hands, and lower extremity weakness. At one point he could barely walk a block without having to stop due to pain and weakness. He was given an initial trial of Rituxan in 2010 with no significant improvement. However following reinitiation of Rituxan in April 2012 he did get a progressive improvement. He was put on a maintenance program which we have continued in this office on an every other month basis through 03/23/2013.  The patient have new onset atrial fibrillation. His echocardiogram shows significant dilated atrium without evidence of increase ventricular wall thickness. 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 Repeat bone marrow aspirate and biopsy on 08/04/2018 was negative for malignancy He had CT imaging study of the abdomen and pelvis in 2019 and recent CT scan of the chest in May 2020 which show no evidence of lymphoma  REVIEW OF SYSTEMS:   Constitutional: Denies fevers, chills or abnormal weight loss Eyes: Denies blurriness of vision Ears, nose, mouth, throat, and face: Denies mucositis or sore throat Respiratory: Denies cough, dyspnea or wheezes Cardiovascular: Denies palpitation, chest discomfort or lower extremity swelling Gastrointestinal:  Denies nausea, heartburn or change in bowel habits Skin: Denies abnormal skin rashes Lymphatics: Denies new lymphadenopathy or easy bruising Neurological:Denies numbness, tingling or new weaknesses Behavioral/Psych: Mood is stable, no new changes  All other systems were reviewed with the patient and are negative.  I have reviewed the past medical history, past surgical history, social history and family history with the patient and they  are unchanged from previous  note.  ALLERGIES:  has No Known Allergies.  MEDICATIONS:  Current Outpatient Medications  Medication Sig Dispense Refill  . amLODipine (NORVASC) 5 MG tablet Take 5 mg by mouth daily.    [name] apixaban (ELIQUIS) 5 MG TABS tablet Take 1 tablet (5 mg total) by mouth 2 (two) times daily. 180 tablet 1  . aspirin EC 81 MG tablet Take 1 tablet (81 mg total) by mouth daily. 150 tablet 2  . atorvastatin (LIPITOR) 20 MG tablet Take 1 tablet (20 mg total) by mouth daily at 6 PM. 90 tablet 3  . bimatoprost (LUMIGAN) 0.01 % SOLN Place 1 drop into both eyes at bedtime.    . Cholecalciferol 25 MCG (1000 UT) tablet Take 1,000 Units by mouth daily.     . dorzolamide-timolol (COSOPT) 22.3-6.8 MG/ML ophthalmic solution Place 1 drop into both eyes 2 (two) times daily.     . finasteride (PROSCAR) 5 MG tablet Take 5 mg by mouth daily.     . fluticasone (FLONASE) 50 MCG/ACT nasal spray Place 2 sprays into both nostrils 2 (two) times daily.    . Fluticasone Propionate, Inhal, (FLOVENT DISKUS) 100 MCG/BLIST AEPB Inhale 1 Inhaler into the lungs 2 (two) times daily. 60 each 1  . metoprolol tartrate (LOPRESSOR) 25 MG tablet Take 0.5 tablets (12.5 mg total) by mouth 2 (two) times daily. 30 tablet 3  . MULTAQ 400 MG tablet TAKE 1 TABLET BY MOUTH TWICE DAILY WITH MEALS (Patient taking differently: Take 400 mg by mouth 2 (two) times a day. ) 180 tablet 1  . Multiple Vitamin (MULTIVITAMIN WITH MINERALS) TABS tablet Take 1 tablet by mouth daily.    . pantoprazole (PROTONIX) 40 MG tablet TAKE 1 TABLET BY MOUTH ONCE DAILY (Patient taking differently: Take 40 mg by mouth daily. ) 90 tablet 3  . Polyethyl Glycol-Propyl Glycol (SYSTANE OP) Place 1 drop into both eyes daily as needed (dry eyes).    . tadalafil (CIALIS) 5 MG tablet Take 5 mg by mouth at bedtime.     . triamterene-hydrochlorothiazide (MAXZIDE-25) 37.5-25 MG tablet Take 1 tablet by mouth once daily 90 tablet 3   No current facility-administered medications for this  visit.     PHYSICAL EXAMINATION: ECOG PERFORMANCE STATUS: 1 - Symptomatic but completely ambulatory  Vitals:   09/14/19 0813  BP: 127/70  Pulse: 84  Resp: 18  Temp: 98.2 F (36.8 C)  SpO2: 96%   Filed Weights   09/14/19 0813  Weight: 180 lb 14.4 oz (82.1 kg)    GENERAL:alert, no distress and comfortable Musculoskeletal:no cyanosis of digits and no clubbing  NEURO: alert & oriented x 3 with fluent speech, no focal motor/sensory deficits  LABORATORY DATA:  I have reviewed the data as listed    Component Value Date/Time   NA 138 09/07/2019 0803   NA 141 05/29/2018 0806   NA 141 07/27/2016 0748   K 3.9 09/07/2019 0803   K 3.9 07/27/2016 0748   CL 104 09/07/2019 0803   CL 104 03/23/2013 0855   CO2 28 09/07/2019 0803   CO2 30 (H) 07/27/2016 0748   GLUCOSE 96 09/07/2019 0803   GLUCOSE 122 07/27/2016 0748   GLUCOSE 98 03/23/2013 0855   BUN 16 09/07/2019 0803   BUN 14 05/29/2018 0806   BUN 16.0 07/27/2016 0748   CREATININE 1.01 09/07/2019 0803   CREATININE 0.93 11/10/2018 1618   CREATININE 1.0 07/27/2016 0748   CALCIUM 9.9 09/07/2019 0803   CALCIUM 10.5 (  H) 07/27/2016 0748   PROT 6.8 09/07/2019 0803   PROT 6.7 05/29/2018 0806   PROT 7.5 07/27/2016 0748   ALBUMIN 4.0 09/07/2019 0803   ALBUMIN 4.4 05/29/2018 0806   ALBUMIN 4.3 07/27/2016 0748   AST 21 09/07/2019 0803   AST 24 07/27/2016 0748   ALT 13 09/07/2019 0803   ALT 19 07/27/2016 0748   ALKPHOS 65 09/07/2019 0803   ALKPHOS 53 07/27/2016 0748   BILITOT 0.4 09/07/2019 0803   BILITOT 0.7 05/29/2018 0806   BILITOT 0.84 07/27/2016 0748   GFRNONAA >60 09/07/2019 0803   GFRAA >60 09/07/2019 0803    No results found for: SPEP, UPEP  Lab Results  Component Value Date   WBC 3.9 (L) 09/07/2019   NEUTROABS 2.4 09/07/2019   HGB 10.5 (L) 09/07/2019   HCT 33.6 (L) 09/07/2019   MCV 85.7 09/07/2019   PLT 192 09/07/2019      Chemistry      Component Value Date/Time   NA 138 09/07/2019 0803   NA 141  05/29/2018 0806   NA 141 07/27/2016 0748   K 3.9 09/07/2019 0803   K 3.9 07/27/2016 0748   CL 104 09/07/2019 0803   CL 104 03/23/2013 0855   CO2 28 09/07/2019 0803   CO2 30 (H) 07/27/2016 0748   BUN 16 09/07/2019 0803   BUN 14 05/29/2018 0806   BUN 16.0 07/27/2016 0748   CREATININE 1.01 09/07/2019 0803   CREATININE 0.93 11/10/2018 1618   CREATININE 1.0 07/27/2016 0748      Component Value Date/Time   CALCIUM 9.9 09/07/2019 0803   CALCIUM 10.5 (H) 07/27/2016 0748   ALKPHOS 65 09/07/2019 0803   ALKPHOS 53 07/27/2016 0748   AST 21 09/07/2019 0803   AST 24 07/27/2016 0748   ALT 13 09/07/2019 0803   ALT 19 07/27/2016 0748   BILITOT 0.4 09/07/2019 0803   BILITOT 0.7 05/29/2018 0806   BILITOT 0.84 07/27/2016 0748       All questions were answered. The patient knows to call the clinic with any problems, questions or concerns. No barriers to learning was detected.  I spent 10 minutes counseling the patient face to face. The total time spent in the appointment was 15 minutes and more than 50% was on counseling and review of test results  Heath Lark, MD 09/14/2019 9:02 AM

## 2019-09-14 NOTE — Assessment & Plan Note (Signed)
I will defer management to his rheumatologist His recent CT imaging study showed no evidence to suggest myeloma

## 2019-09-18 ENCOUNTER — Encounter: Payer: Self-pay | Admitting: Cardiology

## 2019-09-18 ENCOUNTER — Ambulatory Visit (INDEPENDENT_AMBULATORY_CARE_PROVIDER_SITE_OTHER): Payer: Medicare Other | Admitting: Cardiology

## 2019-09-18 ENCOUNTER — Other Ambulatory Visit: Payer: Self-pay

## 2019-09-18 VITALS — BP 112/64 | HR 79 | Ht 68.0 in | Wt 177.0 lb

## 2019-09-18 DIAGNOSIS — I208 Other forms of angina pectoris: Secondary | ICD-10-CM | POA: Diagnosis not present

## 2019-09-18 DIAGNOSIS — I480 Paroxysmal atrial fibrillation: Secondary | ICD-10-CM

## 2019-09-18 NOTE — Patient Instructions (Signed)
Medication Instructions:  You may discontinue your Metoprolol.  Continue all other medications as listed.  If you need a refill on your cardiac medications before your next appointment, please call your pharmacy.   Lab work: None  If you have labs (blood work) drawn today and your tests are completely normal, you will receive your results only by: [name] MyChart Message (if you have MyChart) OR . A paper copy in the mail If you have any lab test that is abnormal or we need to change your treatment, we will call you to review the results.  Testing/Procedures: None  Follow-Up: Follow up in 6 months with Dr. Curt Bears.  You will receive a letter in the mail 2 months before you are due.  Please call us when you receive this letter to schedule your follow up appointment.  Thank you for choosing [facility]!!

## 2019-09-18 NOTE — Progress Notes (Signed)
Electrophysiology Office Note   Date:  09/18/2019   ID:  Raeford, Morini January 31, 1941, MRN [id number]  PCP:  [name], MD  Cardiologist:  Burt Knack Primary Electrophysiologist:  Amandeep Hogston [name], MD    No chief complaint on file.    History of Present Illness: Derrick [name] is a 78 y.o. male who presents today for electrophysiology evaluation.   He has been followed for coronary artery disease. He has a history of myocardial infarction and drug-eluting stent placement in the right coronary artery. 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 He's been managed medically. He has multiple noncardiac problems including CIDP, pulmonary sarcoid, and monoclonal gammopathy of undetermined significance.  He was having episodes of weakness and fatigue and had an echo that showed a normal ejection fraction.  Left heart catheterization showed severe left main disease and he had a three-vessel CABG 05/2019.  Today, denies symptoms of palpitations, chest pain, shortness of breath, orthopnea, PND, lower extremity edema, claudication, dizziness, presyncope, syncope, bleeding, or neurologic sequela. The patient is tolerating medications without difficulties.  He has done very well since his operation.  He is walking 3 to 4 miles per day.  He has completed cardiac rehab.   Past Medical History:  Diagnosis Date   Anemia    BENIGN PROSTATIC HYPERTROPHY, WITH OBSTRUCTION 05/28/2010   CAD (coronary artery disease)    a. s/p NSTEMI 06/01/11: DES to RCA;  b. cath 06/25/11:   dLM 50-60% (FFR 0.87), prox to mid LAD 40-50%, D1 50%, pCFX 50%, RCA stent ok, dPDA 80%, EF 55-60%.  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   DISH (diffuse idiopathic skeletal hyperostosis) 12/02/2015   Diverticulitis    GENERALIZED OSTEOARTHROSIS UNSPECIFIED SITE 05/28/2010   GERD 05/28/2010   Heart murmur    History of gout    History of kidney stones    HYPERLIPIDEMIA 05/28/2010   Hypertension    IgM lambda paraproteinemia    Intrinsic asthma, 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   "associated w/lung sarcoidosis"   Malignant neoplasm of descending colon ([place]) 05/28/2010   MITRAL VALVE PROLAPSE 05/28/2010   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   NSTEMI (non-ST elevated myocardial infarction) ([place]) 06/03/11   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   OSA on CPAP 05/28/2010   PALPITATIONS, CHRONIC 05/28/2010   Parotid gland pain 2010   infection   Peripheral neuropathy    "tx'd w/targeted chemo" (05/12/2016)   Pneumonia 2-3 times   Presence of permanent cardiac pacemaker    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   Sarcoidosis    pulmonalis   UNSPECIFIED INFLAMMATORY AND TOXIC NEUROPATHY 05/28/2010   Vision loss    VITAMIN D DEFICIENCY 05/28/2010   Past Surgical History:  Procedure Laterality Date   APPENDECTOMY  1954   CATARACT EXTRACTION W/ INTRAOCULAR LENS IMPLANT Left    COLON SURGERY  2000   decending colon    CORONARY ANGIOPLASTY WITH STENT PLACEMENT  06/03/11  CORONARY ARTERY BYPASS GRAFT N/A 06/05/2019   Procedure: CORONARY ARTERY BYPASS GRAFTING (CABG) TIMES THREE USING LEFT INTERNAL MAMMARY ARTERY AND RIGHT GREATER SEPHANOUS VEIN;  Surgeon: [name], MD;  Location: [facility];  Service: Open Heart Surgery;  Laterality: N/A;   ELECTROPHYSIOLOGIC STUDY N/A 05/12/2016   Procedure: Cardioversion;  Surgeon: Hannia Matchett [name], MD;   Location: [facility];  Service: Cardiovascular;  Laterality: N/A;   EP IMPLANTABLE DEVICE N/A 05/12/2016   Procedure: Pacemaker Implant;  Surgeon: Bowyn Mercier [name], MD;  Location: [facility];  Service: Cardiovascular;  Laterality: N/A;   EYE SURGERY     INGUINAL HERNIA REPAIR Left 02/21/2019   Procedure: LEFT INGUINAL HERNIA REPAIR WITH MESH;  Surgeon: [name], MD;  Location: [facility];  Service: General;  Laterality: Left;   INSERT / REPLACE / REMOVE PACEMAKER  05/12/2016   INTRAVASCULAR PRESSURE WIRE/FFR STUDY N/A 05/15/2019   Procedure: INTRAVASCULAR PRESSURE WIRE/FFR STUDY;  Surgeon: [name], MD;  Location: [facility];  Service: Cardiovascular;  Laterality: N/A;   KNEE ARTHROSCOPY Right 2003   LEFT HEART CATH AND CORONARY ANGIOGRAPHY N/A 05/15/2019   Procedure: LEFT HEART CATH AND CORONARY ANGIOGRAPHY;  Surgeon: [name], MD;  Location: [facility];  Service: Cardiovascular;  Laterality: N/A;   LEFT HEART CATHETERIZATION WITH CORONARY ANGIOGRAM N/A 03/31/2015   Procedure: LEFT HEART CATHETERIZATION WITH CORONARY ANGIOGRAM;  Surgeon: [name], MD;  Location: [facility];  Service: Cardiovascular;  Laterality: N/A;   LUNG SURGERY  2001   "open lung dissection"   MOHS SURGERY Left ~ 2008   ear   PROSTATE BIOPSY  2010   TEE WITHOUT CARDIOVERSION N/A 06/05/2019   Procedure: TRANSESOPHAGEAL ECHOCARDIOGRAM (TEE);  Surgeon: [name], [name], MD;  Location: [facility];  Service: Open Heart Surgery;  Laterality: N/A;   TONSILLECTOMY  1950     Current Outpatient Medications  Medication Sig Dispense Refill   amLODipine (NORVASC) 5 MG tablet Take 5 mg by mouth daily.     apixaban (ELIQUIS) 5 MG TABS tablet Take 1 tablet (5 mg total) by mouth 2 (two) times daily. 180 tablet 1   aspirin EC 81 MG tablet Take 1 tablet (81 mg total) by mouth daily. 150 tablet 2   atorvastatin (LIPITOR) 20 MG tablet Take 1 tablet (20 mg total) by mouth  daily at 6 PM. 90 tablet 3   bimatoprost (LUMIGAN) 0.01 % SOLN Place 1 drop into both eyes at bedtime.     Cholecalciferol 25 MCG (1000 UT) tablet Take 1,000 Units by mouth daily.      dorzolamide-timolol (COSOPT) 22.3-6.8 MG/ML ophthalmic solution Place 1 drop into both eyes 2 (two) times daily.      finasteride (PROSCAR) 5 MG tablet Take 5 mg by mouth daily.      fluticasone (FLONASE) 50 MCG/ACT nasal spray Place 2 sprays into both nostrils 2 (two) times daily.     Fluticasone Propionate, Inhal, (FLOVENT DISKUS) 100 MCG/BLIST AEPB Inhale 1 Inhaler into the lungs 2 (two) times daily. 60 each 1   metoprolol tartrate (LOPRESSOR) 25 MG tablet Take 0.5 tablets (12.5 mg total) by mouth 2 (two) times daily. 30 tablet 3   MULTAQ 400 MG tablet TAKE 1 TABLET BY MOUTH TWICE DAILY WITH MEALS 180 tablet 1   Multiple Vitamin (MULTIVITAMIN WITH MINERALS) TABS tablet Take 1 tablet by mouth daily.     pantoprazole (PROTONIX) 40 MG tablet TAKE 1 TABLET BY MOUTH ONCE DAILY  90 tablet 3   Polyethyl Glycol-Propyl Glycol (SYSTANE OP) Place 1 drop into both eyes daily as needed (dry eyes).     tadalafil (CIALIS) 5 MG tablet Take 5 mg by mouth at bedtime.      triamterene-hydrochlorothiazide (MAXZIDE-25) 37.5-25 MG tablet Take 1 tablet by mouth once daily 90 tablet 3   No current facility-administered medications for this visit.     Allergies:   Patient has no known allergies.   Social History:  The patient  reports that he has never smoked. He has never used smokeless tobacco. He reports current alcohol use. He reports that he does not use drugs.   Family History:  The patient's family history includes Arrhythmia (age of onset: 67) in his brother; Arthritis in his mother; Cancer in his brother; Colon polyps in his mother; Congestive Heart Failure in his brother; Coronary artery disease in his brother and mother; Heart attack in his brother; Heart disease in his mother.   ROS:  Please see the history  of present illness.   Otherwise, review of systems is positive for none.   All other systems are reviewed and negative.   PHYSICAL EXAM: VS:  BP 112/64    Pulse 79    Ht 5\' 8"  (1.727 m)    Wt 177 lb (80.3 kg)    SpO2 99%    BMI 26.91 kg/m  , BMI Body mass index is 26.91 kg/m. GEN: Well nourished, well developed, in no acute distress  HEENT: normal  Neck: no JVD, carotid bruits, or masses Cardiac: RRR; no murmurs, rubs, or gallops,no edema  Respiratory:  clear to auscultation bilaterally, normal work of breathing GI: soft, nontender, nondistended, + BS MS: no deformity or atrophy  Skin: warm and dry, device site well healed Neuro:  Strength and sensation are intact Psych: euthymic mood, full affect  EKG:  EKG is ordered today. Personal review of the ekg ordered shows AV paced  Personal review of the device interrogation today. Results in [facility]: 06/06/2019: Magnesium 2.3 09/07/2019: ALT 13; BUN 16; Creatinine, Ser 1.01; Hemoglobin 10.5; Platelets 192; Potassium 3.9; Sodium 138    Lipid Panel     Component Value Date/Time   CHOL 154 05/29/2018 0806   TRIG 52 05/29/2018 0806   HDL 60 05/29/2018 0806   CHOLHDL 2.6 05/29/2018 0806   CHOLHDL 2.7 03/19/2016 0922   VLDL 14 03/19/2016 0922   LDLCALC 84 05/29/2018 0806     Wt Readings from Last 3 Encounters:  09/18/19 177 lb (80.3 kg)  09/14/19 180 lb 14.4 oz (82.1 kg)  08/24/19 179 lb 7.3 oz (81.4 kg)      Other studies Reviewed: Additional studies/ records that were reviewed today include: TTE 05/22/19 Review of the above records today demonstrates:    1. The left ventricle has normal systolic function, with an ejection fraction of 55-60%. The cavity size was normal. Left ventricular diastolic Doppler parameters are consistent with pseudonormalization. Indeterminate filling pressures.  2. Apical hypokinesis.  3. The right ventricle has normal systolic function. The cavity was normal. There is no increase in  right ventricular wall thickness.  4. Left atrial size was severely dilated.  5. There is mild mitral annular calcification present. No evidence of mitral valve stenosis.  6. The aortic valve is tricuspid. Moderate thickening of the aortic valve. Moderate calcification of the aortic valve. Mild stenosis of the aortic valve.  7. There is mild to moderate dilatation of the ascending aorta measuring  43 mm.   ASSESSMENT AND PLAN:  1.  Paroxysmal atrial fibrillation: Currently on Eliquis and Multitak.  He is on a low-dose of metoprolol which we Rhodes Calvert stop today.  This patients CHA2DS2-VASc Score and unadjusted Ischemic Stroke Rate (% per year) is equal to 3.2 % stroke rate/year from a score of 3  Above score calculated as 1 point each if present [CHF, HTN, DM, Vascular=MI/PAD/Aortic Plaque, Age if 65-74, or Male] Above score calculated as 2 points each if present [Age > 75, or Stroke/TIA/TE]   2. Hypertension: Currently well controlled.  Stopping metoprolol today.  3. Hyperlipidemia: Continue statin  4. Coronary artery disease: Status post CABG 06/05/2019.  Currently feeling well.  Plan per primary cardiology.  5. Sick sinus syndrome: [employer]tronic dual-chamber pacemaker implanted 05/12/2016.  Device functioning appropriately.  No changes.  Current medicines are reviewed at length with the patient today.   The patient does not have concerns regarding his medicines.  The following changes were made today: None  Labs/ tests ordered today include:  Orders Placed This Encounter  Procedures   EKG 12-Lead     Disposition:   FU with Brittinie Wherley 6 months  Signed, Ulice Follett [name], MD  09/18/2019 10:11 AM     [address] [name] [address] [phone] (office) [phone] (fax)

## 2019-09-19 ENCOUNTER — Other Ambulatory Visit: Payer: Medicare Other | Admitting: *Deleted

## 2019-09-19 DIAGNOSIS — I2510 Atherosclerotic heart disease of native coronary artery without angina pectoris: Secondary | ICD-10-CM | POA: Diagnosis not present

## 2019-09-19 DIAGNOSIS — E782 Mixed hyperlipidemia: Secondary | ICD-10-CM

## 2019-09-19 LAB — HEPATIC FUNCTION PANEL
ALT: 14 IU/L (ref 0–44)
AST: 18 IU/L (ref 0–40)
Albumin: 4.2 g/dL (ref 3.7–4.7)
Alkaline Phosphatase: 62 IU/L (ref 39–117)
Bilirubin Total: 0.5 mg/dL (ref 0.0–1.2)
Bilirubin, Direct: 0.17 mg/dL (ref 0.00–0.40)
Total Protein: 6.6 g/dL (ref 6.0–8.5)

## 2019-09-19 LAB — LIPID PANEL
Chol/HDL Ratio: 2.5 ratio (ref 0.0–5.0)
Cholesterol, Total: 147 mg/dL (ref 100–199)
HDL: 58 mg/dL (ref >39)
LDL Chol Calc (NIH): 78 mg/dL (ref 0–99)
Triglycerides: 54 mg/dL (ref 0–149)
VLDL Cholesterol Cal: 11 mg/dL (ref 5–40)

## 2019-09-20 ENCOUNTER — Other Ambulatory Visit: Payer: Self-pay | Admitting: *Deleted

## 2019-09-20 DIAGNOSIS — E785 Hyperlipidemia, unspecified: Secondary | ICD-10-CM

## 2019-09-20 DIAGNOSIS — I2510 Atherosclerotic heart disease of native coronary artery without angina pectoris: Secondary | ICD-10-CM

## 2019-09-20 MED ORDER — ATORVASTATIN CALCIUM 40 MG PO TABS: 40.0000 mg | ORAL_TABLET | Freq: Every day | ORAL | 3 refills | Status: DC

## 2019-09-21 ENCOUNTER — Ambulatory Visit (INDEPENDENT_AMBULATORY_CARE_PROVIDER_SITE_OTHER): Payer: Medicare Other | Admitting: *Deleted

## 2019-09-21 DIAGNOSIS — I442 Atrioventricular block, complete: Secondary | ICD-10-CM | POA: Diagnosis not present

## 2019-09-21 LAB — CUP PACEART REMOTE DEVICE CHECK
Battery Remaining Longevity: 63 mo
Battery Voltage: 3 V
Brady Statistic AP VP Percent: 92.81 %
Brady Statistic AP VS Percent: 0.02 %
Brady Statistic AS VP Percent: 7.16 %
Brady Statistic AS VS Percent: 0.01 %
Brady Statistic RA Percent Paced: 92.43 %
Brady Statistic RV Percent Paced: 99.71 %
Date Time Interrogation Session: 20200925083230
Implantable Lead Implant Date: 20170517
Implantable Lead Implant Date: 20170517
Implantable Lead Location: 753859
Implantable Lead Location: 753860
Implantable Lead Model: 5076
Implantable Lead Model: 5076
Implantable Pulse Generator Implant Date: 20170517
Lead Channel Impedance Value: 342 Ohm
Lead Channel Impedance Value: 399 Ohm
Lead Channel Impedance Value: 456 Ohm
Lead Channel Impedance Value: 570 Ohm
Lead Channel Pacing Threshold Amplitude: 0.75 V
Lead Channel Pacing Threshold Amplitude: 0.875 V
Lead Channel Pacing Threshold Pulse Width: 0.4 ms
Lead Channel Pacing Threshold Pulse Width: 0.4 ms
Lead Channel Sensing Intrinsic Amplitude: 1.375 mV
Lead Channel Sensing Intrinsic Amplitude: 1.375 mV
Lead Channel Sensing Intrinsic Amplitude: 9.875 mV
Lead Channel Sensing Intrinsic Amplitude: 9.875 mV
Lead Channel Setting Pacing Amplitude: 2 V
Lead Channel Setting Pacing Amplitude: 2.5 V
Lead Channel Setting Pacing Pulse Width: 0.4 ms
Lead Channel Setting Sensing Sensitivity: 2.8 mV

## 2019-09-25 ENCOUNTER — Encounter: Payer: Self-pay | Admitting: Cardiology

## 2019-09-25 NOTE — Progress Notes (Signed)
Remote pacemaker transmission.   

## 2019-09-26 ENCOUNTER — Ambulatory Visit: Payer: Medicare Other | Admitting: Physician Assistant

## 2019-09-26 ENCOUNTER — Ambulatory Visit (INDEPENDENT_AMBULATORY_CARE_PROVIDER_SITE_OTHER): Payer: Medicare Other

## 2019-09-26 ENCOUNTER — Telehealth: Payer: Self-pay | Admitting: Family Medicine

## 2019-09-26 VITALS — BP 112/64 | Wt 178.0 lb

## 2019-09-26 DIAGNOSIS — R7303 Prediabetes: Secondary | ICD-10-CM

## 2019-09-26 DIAGNOSIS — E559 Vitamin D deficiency, unspecified: Secondary | ICD-10-CM

## 2019-09-26 DIAGNOSIS — E785 Hyperlipidemia, unspecified: Secondary | ICD-10-CM

## 2019-09-26 DIAGNOSIS — Z0000 Encounter for general adult medical examination without abnormal findings: Secondary | ICD-10-CM | POA: Diagnosis not present

## 2019-09-26 NOTE — Progress Notes (Signed)
Subjective:   Derrick [name] is a 78 y.o. male who presents for Medicare Annual/Subsequent preventive examination.  Review of Systems:    This visit is being conducted through telemedicine via telephone at the nurse health advisor's home address due to the COVID-19 pandemic. This patient has given me verbal consent via doximity to conduct this visit, patient states they are participating from their home address. Some vital signs may be absent or patient reported.    Patient identification: identified by name, DOB, and current address  Cardiac Risk Factors include: advanced age (>38men, >21 women);male gender;dyslipidemia     Objective:    Vitals: BP 112/64   Wt 178 lb (80.7 kg)   BMI 27.06 kg/m   Body mass index is 27.06 kg/m.  Advanced Directives 09/26/2019 06/08/2019 06/06/2019 06/04/2019 05/15/2019 02/21/2019 02/13/2019  Does Patient Have a Medical Advance Directive? Yes - Yes Yes Yes Yes Yes  Type of Paramedic of [place];Living will [place];Living will [place];Living will [place];Living will [place];Living will Living will;Healthcare Power of [place];Living will  Does patient want to make changes to medical advance directive? - No - Patient declined - No - Patient declined - No - Patient declined -  Copy of [place] in Chart? Yes - validated most recent copy scanned in chart (See row information) - Yes - validated most recent copy scanned in chart (See row information) No - copy requested Yes - validated most recent copy scanned in chart (See row information) Yes - validated most recent copy scanned in chart (See row information) No - copy requested  Would patient like information on creating a medical advance directive? - - - - - - -    Tobacco Social History   Tobacco Use  Smoking Status Never Smoker  Smokeless Tobacco  Never Used     Counseling given: Not Answered   Clinical Intake:  Pre-visit preparation completed: Yes  Pain : No/denies pain     Nutritional Status: BMI 25 -29 Overweight Nutritional Risks: None Diabetes: No  How often do you need to have someone help you when you read instructions, pamphlets, or other written materials from your doctor or pharmacy?: 1 - Never What is the last grade level you completed in school?: Masters  Interpreter Needed?: No  Information entered by :: CJohnson, LPN  Past Medical History:  Diagnosis Date  . Anemia   . BENIGN PROSTATIC HYPERTROPHY, WITH OBSTRUCTION 05/28/2010  . CAD (coronary artery disease)    a. s/p NSTEMI 06/01/11: DES to RCA;  b. cath 06/25/11:   dLM 50-60% (FFR 0.87), prox to mid LAD 40-50%, D1 50%, pCFX 50%, RCA stent ok, dPDA 80%, EF 55-60%.  His FFR was felt to be negative and therefore medical therapy was recommended ;  echo 6/12: EF 55-60%, mild AS   . Chronic back pain   . CIDP (chronic inflammatory demyelinating polyneuropathy) ([place]) 04/11/2012  . Colon cancer ([place]) dx'd 2000   "left"  . COLONIC POLYPS, ADENOMATOUS, HX OF 05/28/2010  . Complication of anesthesia    "stopped breathing; related to my sleep apnea" & urinary retention   . COPD (chronic obstructive pulmonary disease) ([place])    "associated w/lung sarcoidosis"  . 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 DISH (diffuse idiopathic skeletal hyperostosis) 12/02/2015  . Diverticulitis   . GENERALIZED OSTEOARTHROSIS UNSPECIFIED SITE 05/28/2010  . GERD 05/28/2010  . Heart murmur   .  History of gout   . History of kidney stones   . HYPERLIPIDEMIA 05/28/2010  . Hypertension   . IgM lambda paraproteinemia   . 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 Malignant neoplasm of descending colon ([place]) 05/28/2010  . MITRAL VALVE PROLAPSE 05/28/2010  . 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 NSTEMI (non-ST elevated myocardial  infarction) ([place]) 06/03/11  . 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  . OSA on CPAP 05/28/2010  . PALPITATIONS, CHRONIC 05/28/2010  . Parotid gland pain 2010   infection  . Peripheral neuropathy    "tx'd w/targeted chemo" (05/12/2016)  . Pneumonia 2-3 times  . Presence of permanent cardiac pacemaker   . 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 Sarcoidosis    pulmonalis  . UNSPECIFIED INFLAMMATORY AND TOXIC NEUROPATHY 05/28/2010  . Vision loss   . VITAMIN D DEFICIENCY 05/28/2010   Past Surgical History:  Procedure Laterality Date  . APPENDECTOMY  1954  . CATARACT EXTRACTION W/ INTRAOCULAR LENS IMPLANT Left   . COLON SURGERY  2000   decending colon   . CORONARY ANGIOPLASTY WITH STENT PLACEMENT  06/03/11  . CORONARY ARTERY BYPASS GRAFT N/A 06/05/2019   Procedure: CORONARY ARTERY BYPASS GRAFTING (CABG) TIMES THREE USING LEFT INTERNAL MAMMARY ARTERY AND RIGHT GREATER SEPHANOUS VEIN;  Surgeon: [name], MD;  Location: [facility];  Service: Open Heart Surgery;  Laterality: N/A;  . ELECTROPHYSIOLOGIC STUDY N/A 05/12/2016   Procedure: Cardioversion;  Surgeon: [name], MD;  Location: [facility];  Service: Cardiovascular;  Laterality: N/A;  . EP IMPLANTABLE DEVICE N/A 05/12/2016   Procedure: Pacemaker Implant;  Surgeon: [name], MD;  Location: [facility];  Service: Cardiovascular;  Laterality: N/A;  . EYE SURGERY    . INGUINAL HERNIA REPAIR Left 02/21/2019   Procedure: LEFT INGUINAL HERNIA REPAIR WITH MESH;  Surgeon: [name], MD;  Location: [facility];  Service: General;  Laterality: Left;  . INSERT / REPLACE / REMOVE PACEMAKER  05/12/2016  . INTRAVASCULAR PRESSURE WIRE/FFR STUDY N/A 05/15/2019   Procedure: INTRAVASCULAR PRESSURE WIRE/FFR STUDY;  Surgeon: [name], MD;  Location: [facility];  Service: Cardiovascular;  Laterality: N/A;  . KNEE ARTHROSCOPY Right 2003  . LEFT HEART CATH AND CORONARY ANGIOGRAPHY N/A 05/15/2019   Procedure: LEFT HEART CATH AND  CORONARY ANGIOGRAPHY;  Surgeon: [name], MD;  Location: [facility];  Service: Cardiovascular;  Laterality: N/A;  . LEFT HEART CATHETERIZATION WITH CORONARY ANGIOGRAM N/A 03/31/2015   Procedure: LEFT HEART CATHETERIZATION WITH CORONARY ANGIOGRAM;  Surgeon: [name], MD;  Location: [facility];  Service: Cardiovascular;  Laterality: N/A;  . LUNG SURGERY  2001   "open lung dissection"  . MOHS SURGERY Left ~ 2008   ear  . PROSTATE BIOPSY  2010  . TEE WITHOUT CARDIOVERSION N/A 06/05/2019   Procedure: TRANSESOPHAGEAL ECHOCARDIOGRAM (TEE);  Surgeon: [name], [name], MD;  Location: [facility];  Service: Open Heart Surgery;  Laterality: N/A;  . TONSILLECTOMY  1950   Family History  Problem Relation Age of Onset  . Arthritis Mother        severe  . Coronary artery disease Mother   . Colon polyps Mother   . Heart disease Mother   . Coronary artery disease Brother   . Cancer Brother        choriocarcinoma  . Arrhythmia Brother 32       Afib/Tachycardia  . Heart attack Brother   .  Congestive Heart Failure Brother    Social History   Socioeconomic History  . Marital status: Married    Spouse name: Not on file  . Number of children: 2  . Years of education: MS-Coll.  . Highest education level: Not on file  Occupational History  . Occupation: Optometrist    Comment: Building services engineer  Social Needs  . Financial resource strain: Not hard at all  . Food insecurity    Worry: Never true    Inability: Never true  . Transportation needs    Medical: No    Non-medical: No  Tobacco Use  . Smoking status: Never Smoker  . Smokeless tobacco: Never Used  Substance and Sexual Activity  . Alcohol use: Yes    Comment: occasional  . Drug use: No  . Sexual activity: Yes    Birth control/protection: None  Lifestyle  . Physical activity    Days per week: 7 days    Minutes per session: 30 min  . Stress: Not at all  Relationships  . Social Herbalist on phone: Not  on file    Gets together: Not on file    Attends religious service: Not on file    Active member of club or organization: Not on file    Attends meetings of clubs or organizations: Not on file    Relationship status: Not on file  Other Topics Concern  . Not on file  Social History Narrative   College- [employer]; [employer]-environment science and mgt. Married [name])  "6" 1 son- 52; 1 dtr "22" 2 grandchildren. 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 End of life-provided discussive context and provided packet.    Outpatient Encounter Medications as of 09/26/2019  Medication Sig  . amLODipine (NORVASC) 5 MG tablet Take 5 mg by mouth daily.  [name] apixaban (ELIQUIS) 5 MG TABS tablet Take 1 tablet (5 mg total) by mouth 2 (two) times daily.  [name] aspirin EC 81 MG tablet Take 1 tablet (81 mg total) by mouth daily.  [name] atorvastatin (LIPITOR) 40 MG tablet Take 1 tablet (40 mg total) by mouth daily.  . bimatoprost (LUMIGAN) 0.01 % SOLN Place 1 drop into both eyes at bedtime.  . Cholecalciferol 25 MCG (1000 UT) tablet Take 1,000 Units by mouth daily.   . dorzolamide-timolol (COSOPT) 22.3-6.8 MG/ML ophthalmic solution Place 1 drop into both eyes 2 (two) times daily.   . finasteride (PROSCAR) 5 MG tablet Take 5 mg by mouth daily.   . fluticasone (FLONASE) 50 MCG/ACT nasal spray Place 2 sprays into both nostrils 2 (two) times daily.  . Fluticasone Propionate, Inhal, (FLOVENT DISKUS) 100 MCG/BLIST AEPB Inhale 1 Inhaler into the lungs 2 (two) times daily.  . MULTAQ 400 MG tablet TAKE 1 TABLET BY MOUTH TWICE DAILY WITH MEALS  . Multiple Vitamin (MULTIVITAMIN WITH MINERALS) TABS tablet Take 1 tablet by mouth daily.  . pantoprazole (PROTONIX) 40 MG tablet TAKE 1 TABLET BY MOUTH ONCE DAILY  . Polyethyl Glycol-Propyl Glycol (SYSTANE OP) Place 1 drop into both eyes daily as needed (dry eyes).  . tadalafil (CIALIS) 5 MG tablet Take 5 mg by mouth at bedtime.   . triamterene-hydrochlorothiazide (MAXZIDE-25)  37.5-25 MG tablet Take 1 tablet by mouth once daily   No facility-administered encounter medications on file as of 09/26/2019.     Activities of Daily Living In your present state of health, do you have any difficulty performing the following activities: 09/26/2019 06/06/2019  Hearing? Y N  Comment  minor hearing loss -  Vision? Y N  Comment left eye optic scar tissue -  Difficulty concentrating or making decisions? N N  Walking or climbing stairs? N Y  Dressing or bathing? N N  Doing errands, shopping? N N  Preparing Food and eating ? N -  Using the Toilet? N -  In the past six months, have you accidently leaked urine? N -  Do you have problems with loss of bowel control? N -  Managing your Medications? N -  Managing your Finances? N -  Housekeeping or managing your Housekeeping? N -  Some recent data might be hidden    Patient Care Team: [name], MD as PCP - General (Family Medicine) [name], MD as PCP - Electrophysiology (Cardiology) [name], MD as PCP - Cardiology (Cardiology) Heath Lark, MD as Consulting Physician (Hematology and Oncology)   Assessment:   This is a routine wellness examination for Brenten.  Exercise Activities and Dietary recommendations Current Exercise Habits: Home exercise routine, Type of exercise: walking, Time (Minutes): 30, Frequency (Times/Week): 7, Weekly Exercise (Minutes/Week): 210, Intensity: Mild, Exercise limited by: cardiac condition(s)  Goals    . DIET - INCREASE WATER INTAKE     Starting 06/13/2018, I will continue to drink at least 2 liters of water daily.     . Patient Stated     09/26/2019, Patient wants to focus on improving his energy level after just having heart surgery       Fall Risk Fall Risk  09/26/2019 07/26/2018 06/13/2018 03/01/2018 03/29/2017  Falls in the past year? 1 Yes Yes No Yes  Number falls in past yr: 0 2 or more 2 or more - 2 or more  Injury with Fall? 0 No No - No  Risk for fall due to :  Medication side effect;Impaired balance/gait Other (Comment) Impaired balance/gait - -  Risk for fall due to: Comment - neuropathy - - -  Follow up Falls evaluation completed;Falls prevention discussed - - - -   Is the patient's home free of loose throw rugs in walkways, pet beds, electrical cords, etc?   yes      Grab bars in the bathroom? yes      Handrails on the stairs?   yes      Adequate lighting?   yes  Timed Get Up and Go Performed: n/a  Depression Screen PHQ 2/9 Scores 09/26/2019 08/22/2019 07/16/2019 07/10/2019  PHQ - 2 Score 0 0 0 0  PHQ- 9 Score 0 - - -    Cognitive Function MMSE - Mini Mental State Exam 09/26/2019 06/13/2018  Orientation to time 5 5  Orientation to Place 5 5  Registration 3 3  Attention/ Calculation 5 0  Recall 2 1  Recall-comments - unable to recall 2 of 3 words  Language- name 2 objects - 0  Language- repeat 1 1  Language- follow 3 step command - 3  Language- read & follow direction - 0  Write a sentence - 0  Copy design - 0  Total score - 18  Mini Cog  Mini-Cog screen was completed. Maximum score is 22. A value of 0 denotes this part of the MMSE was not completed or the patient failed this part of the Mini-Cog screening.  Montreal Cognitive Assessment  07/26/2018 01/26/2018  Visuospatial/ Executive (0/5) 2 4  Naming (0/3) 3 3  Attention: Read list of digits (0/2) 2 1  Attention: Read list of letters (0/1) 1 1  Attention: Serial 7 subtraction starting at 100 (0/3) 3 3  Language: Repeat phrase (0/2) 2 1  Language : Fluency (0/1) 1 0  Abstraction (0/2) 2 2  Delayed Recall (0/5) 4 3  Orientation (0/6) 5 6  Total 25 24      Immunization History  Administered Date(s) Administered  . Fluad Quad(high Dose 65+) 08/14/2019  . Influenza Split 09/26/2012  . Influenza, High Dose Seasonal PF 09/02/2015, 08/24/2016, 09/04/2018  . Influenza,inj,Quad PF,6+ Mos 09/25/2013, 09/12/2014  . Influenza-Unspecified 09/15/2017, 09/05/2018  . Pneumococcal  Conjugate-13 12/13/2013  . Pneumococcal Polysaccharide-23 02/09/2016  . Tdap 01/13/2016  . Zoster Recombinat (Shingrix) 12/21/2017    Qualifies for Shingles Vaccine? #1 completed 12/21/2017  Screening Tests Health Maintenance  Topic Date Due  . COLONOSCOPY  11/08/2020  . TETANUS/TDAP  01/12/2026  . INFLUENZA VACCINE  Completed  . PNA vac Low Risk Adult  Completed   Cancer Screenings: Lung: Low Dose CT Chest recommended if Age 34-80 years, 30 pack-year currently smoking OR have quit w/in 15years. Patient does not qualify. Colorectal: completed 11/08/2017  Additional Screenings:  Hepatitis C Screening:n/a      Plan:    Patient wants to improve his energy level after having heart surgery  I have personally reviewed and noted the following in the patient's chart:   . Medical and social history . Use of alcohol, tobacco or illicit drugs  . Current medications and supplements . Functional ability and status . Nutritional status . Physical activity . Advanced directives . List of other physicians . Hospitalizations, surgeries, and ER visits in previous 12 months . Vitals . Screenings to include cognitive, depression, and falls . Referrals and appointments  In addition, I have reviewed and discussed with patient certain preventive protocols, quality metrics, and best practice recommendations. A written personalized care plan for preventive services as well as general preventive health recommendations were provided to patient.     Andrez Grime, LPN  [id number]

## 2019-09-26 NOTE — Patient Instructions (Signed)
[name] , Thank you for taking time to come for your Medicare Wellness Visit. I appreciate your ongoing commitment to your health goals. Please review the following plan we discussed and let me know if I can assist you in the future.   Screening recommendations/referrals: Colonoscopy: up to date, completed 11/08/2017 Recommended yearly ophthalmology/optometry visit for glaucoma screening and checkup Recommended yearly dental visit for hygiene and checkup  Vaccinations: Influenza vaccine: up to date, completed 08/14/2019 Pneumococcal vaccine: series completed  Tdap vaccine: up to date, completed 01/13/2016 Shingles vaccine: #1 completed 12/21/2017    Advanced directives: copy in chart  Conditions/risks identified: hyperlidemia  Next appointment: 10/05/2019 @ 8:40 am  Preventive Care 76 Years and Older, Male Preventive care refers to lifestyle choices and visits with your health care provider that can promote health and wellness. What does preventive care include?  A yearly physical exam. This is also called an annual well check.  Dental exams once or twice a year.  Routine eye exams. Ask your health care provider how often you should have your eyes checked.  Personal lifestyle choices, including:  Daily care of your teeth and gums.  Regular physical activity.  Eating a healthy diet.  Avoiding tobacco and drug use.  Limiting alcohol use.  Practicing safe sex.  Taking low doses of aspirin every day.  Taking vitamin and mineral supplements as recommended by your health care provider. What happens during an annual well check? The services and screenings done by your health care provider during your annual well check will depend on your age, overall health, lifestyle risk factors, and family history of disease. Counseling  Your health care provider may ask you questions about your:  Alcohol use.  Tobacco use.  Drug use.  Emotional well-being.  Home and relationship  well-being.  Sexual activity.  Eating habits.  History of falls.  Memory and ability to understand (cognition).  Work and work Statistician. Screening  You may have the following tests or measurements:  Height, weight, and BMI.  Blood pressure.  Lipid and cholesterol levels. These may be checked every 5 years, or more frequently if you are over 76 years old.  Skin check.  Lung cancer screening. You may have this screening every year starting at age 27 if you have a 30-pack-year history of smoking and currently smoke or have quit within the past 15 years.  Fecal occult blood test (FOBT) of the stool. You may have this test every year starting at age 5.  Flexible sigmoidoscopy or colonoscopy. You may have a sigmoidoscopy every 5 years or a colonoscopy every 10 years starting at age 48.  Prostate cancer screening. Recommendations will vary depending on your family history and other risks.  Hepatitis C blood test.  Hepatitis B blood test.  Sexually transmitted disease (STD) testing.  Diabetes screening. This is done by checking your blood sugar (glucose) after you have not eaten for a while (fasting). You may have this done every 1-3 years.  Abdominal aortic aneurysm (AAA) screening. You may need this if you are a current or former smoker.  Osteoporosis. You may be screened starting at age 76 if you are at high risk. Talk with your health care provider about your test results, treatment options, and if necessary, the need for more tests. Vaccines  Your health care provider may recommend certain vaccines, such as:  Influenza vaccine. This is recommended every year.  Tetanus, diphtheria, and acellular pertussis (Tdap, Td) vaccine. You may need a Td booster  every 10 years.  Zoster vaccine. You may need this after age 18.  Pneumococcal 13-valent conjugate (PCV13) vaccine. One dose is recommended after age 67.  Pneumococcal polysaccharide (PPSV23) vaccine. One dose is  recommended after age 15. Talk to your health care provider about which screenings and vaccines you need and how often you need them. This information is not intended to replace advice given to you by your health care provider. Make sure you discuss any questions you have with your health care provider. Document Released: 01/09/2016 Document Revised: 09/01/2016 Document Reviewed: 10/14/2015 Elsevier Interactive Patient Education  2017 [place] Prevention in the Home Falls can cause injuries. They can happen to people of all ages. There are many things you can do to make your home safe and to help prevent falls. What can I do on the outside of my home?  Regularly fix the edges of walkways and driveways and fix any cracks.  Remove anything that might make you trip as you walk through a door, such as a raised step or threshold.  Trim any bushes or trees on the path to your home.  Use bright outdoor lighting.  Clear any walking paths of anything that might make someone trip, such as rocks or tools.  Regularly check to see if handrails are loose or broken. Make sure that both sides of any steps have handrails.  Any raised decks and porches should have guardrails on the edges.  Have any leaves, snow, or ice cleared regularly.  Use sand or salt on walking paths during winter.  Clean up any spills in your garage right away. This includes oil or grease spills. What can I do in the bathroom?  Use night lights.  Install grab bars by the toilet and in the tub and shower. Do not use towel bars as grab bars.  Use non-skid mats or decals in the tub or shower.  If you need to sit down in the shower, use a plastic, non-slip stool.  Keep the floor dry. Clean up any water that spills on the floor as soon as it happens.  Remove soap buildup in the tub or shower regularly.  Attach bath mats securely with double-sided non-slip rug tape.  Do not have throw rugs and other things on  the floor that can make you trip. What can I do in the bedroom?  Use night lights.  Make sure that you have a light by your bed that is easy to reach.  Do not use any sheets or blankets that are too big for your bed. They should not hang down onto the floor.  Have a firm chair that has side arms. You can use this for support while you get dressed.  Do not have throw rugs and other things on the floor that can make you trip. What can I do in the kitchen?  Clean up any spills right away.  Avoid walking on wet floors.  Keep items that you use a lot in easy-to-reach places.  If you need to reach something above you, use a strong step stool that has a grab bar.  Keep electrical cords out of the way.  Do not use floor polish or wax that makes floors slippery. If you must use wax, use non-skid floor wax.  Do not have throw rugs and other things on the floor that can make you trip. What can I do with my stairs?  Do not leave any items on the stairs.  Make  sure that there are handrails on both sides of the stairs and use them. Fix handrails that are broken or loose. Make sure that handrails are as long as the stairways.  Check any carpeting to make sure that it is firmly attached to the stairs. Fix any carpet that is loose or worn.  Avoid having throw rugs at the top or bottom of the stairs. If you do have throw rugs, attach them to the floor with carpet tape.  Make sure that you have a light switch at the top of the stairs and the bottom of the stairs. If you do not have them, ask someone to add them for you. What else can I do to help prevent falls?  Wear shoes that:  Do not have high heels.  Have rubber bottoms.  Are comfortable and fit you well.  Are closed at the toe. Do not wear sandals.  If you use a stepladder:  Make sure that it is fully opened. Do not climb a closed stepladder.  Make sure that both sides of the stepladder are locked into place.  Ask someone to  hold it for you, if possible.  Clearly mark and make sure that you can see:  Any grab bars or handrails.  First and last steps.  Where the edge of each step is.  Use tools that help you move around (mobility aids) if they are needed. These include:  Canes.  Walkers.  Scooters.  Crutches.  Turn on the lights when you go into a dark area. Replace any light bulbs as soon as they burn out.  Set up your furniture so you have a clear path. Avoid moving your furniture around.  If any of your floors are uneven, fix them.  If there are any pets around you, be aware of where they are.  Review your medicines with your doctor. Some medicines can make you feel dizzy. This can increase your chance of falling. Ask your doctor what other things that you can do to help prevent falls. This information is not intended to replace advice given to you by your health care provider. Make sure you discuss any questions you have with your health care provider. Document Released: 10/09/2009 Document Revised: 05/20/2016 Document Reviewed: 01/17/2015 Elsevier Interactive Patient Education  2017 [employer].

## 2019-09-26 NOTE — Telephone Encounter (Signed)
Mr. Bouman notified.  Lab appointment cancelled.

## 2019-09-26 NOTE — Telephone Encounter (Signed)
-----   Message from Ellamae Sia sent at 09/20/2019  2:40 PM EDT ----- Regarding: Lab orders  for Thursday, 10.1.20 Patient is scheduled for CPX labs, please order future labs, Thanks , Karna Christmas

## 2019-09-26 NOTE — Telephone Encounter (Signed)
-----   Message from [name], MD sent at 09/26/2019  4:25 PM EDT ----- Regarding: RE: Lab orders  for Thursday, 10.1.20 Looks like he just had labs... CMET and lipid... I have nothing to add. He can cancel. Amy ----- Message ----- From: Ellamae Sia Sent: 09/20/2019   2:40 PM EDT To: [name], MD Subject: Lab orders  for Thursday, 10.1.20              Patient is scheduled for CPX labs, please order future labs, Thanks , Karna Christmas

## 2019-09-26 NOTE — Progress Notes (Signed)
PCP notes: none  Health Maintenance: none    Abnormal Screenings: none    Patient concerns: none    Nurse concerns: none    Next PCP appt.: 10/05/2019 @ 8:40 am

## 2019-09-27 ENCOUNTER — Other Ambulatory Visit: Payer: Medicare Other

## 2019-09-27 ENCOUNTER — Ambulatory Visit: Payer: Medicare Other

## 2019-10-03 ENCOUNTER — Other Ambulatory Visit: Payer: Self-pay | Admitting: Cardiovascular Disease

## 2019-10-03 ENCOUNTER — Other Ambulatory Visit: Payer: Self-pay

## 2019-10-03 ENCOUNTER — Encounter: Payer: Self-pay | Admitting: Physician Assistant

## 2019-10-03 ENCOUNTER — Ambulatory Visit (INDEPENDENT_AMBULATORY_CARE_PROVIDER_SITE_OTHER): Payer: Medicare Other | Admitting: Physician Assistant

## 2019-10-03 ENCOUNTER — Other Ambulatory Visit: Payer: Self-pay | Admitting: Cardiology

## 2019-10-03 VITALS — BP 120/60 | HR 63 | Ht 68.0 in | Wt 181.8 lb

## 2019-10-03 DIAGNOSIS — I2510 Atherosclerotic heart disease of native coronary artery without angina pectoris: Secondary | ICD-10-CM | POA: Diagnosis not present

## 2019-10-03 DIAGNOSIS — Z950 Presence of cardiac pacemaker: Secondary | ICD-10-CM | POA: Diagnosis not present

## 2019-10-03 DIAGNOSIS — I4891 Unspecified atrial fibrillation: Secondary | ICD-10-CM

## 2019-10-03 DIAGNOSIS — I480 Paroxysmal atrial fibrillation: Secondary | ICD-10-CM

## 2019-10-03 DIAGNOSIS — I10 Essential (primary) hypertension: Secondary | ICD-10-CM | POA: Diagnosis not present

## 2019-10-03 DIAGNOSIS — E782 Mixed hyperlipidemia: Secondary | ICD-10-CM | POA: Diagnosis not present

## 2019-10-03 DIAGNOSIS — I208 Other forms of angina pectoris: Secondary | ICD-10-CM | POA: Diagnosis not present

## 2019-10-03 NOTE — Patient Instructions (Signed)
Medication Instructions:   Your physician recommends that you continue on your current medications as directed. Please refer to the Current Medication list given to you today.  If you need a refill on your cardiac medications before your next appointment, please call your pharmacy.   Lab work:  None ordered today  Testing/Procedures:  None ordered today  Follow-Up: At [facility], you and your health needs are our priority.  As part of our continuing mission to provide you with exceptional heart care, we have created designated Provider Care Teams.  These Care Teams include your primary Cardiologist (physician) and Advanced Practice Providers (APPs -  Physician Assistants and Nurse Practitioners) who all work together to provide you with the care you need, when you need it. You will need a follow up appointment in:  6 months.  Please call our office 2 months in advance to schedule this appointment.  You may see [name], MD or one of the following Advanced Practice Providers on your designated Care Team: Richardson Dopp, PA-C [place], [place] . Daune Perch, NP

## 2019-10-03 NOTE — Progress Notes (Signed)
Cardiology Office Note:    Date:  10/03/2019   ID:  Derrick [name] September 16, 1941, MRN [id number]  PCP:  Derrick Sanders, MD  Cardiologist:  Derrick Mocha, MD   Electrophysiologist:  Derrick Haw, MD   Referring MD: Derrick Sanders, MD   Chief Complaint  Patient presents with  . Follow-up    CAD s/p CABG, Parox AF     History of Present Illness:    Derrick [name] is a 78 y.o. male with:   Coronary artery disease ? Status post inferior MI in 2012 treated with DES to the RCA ? LM Dz >> S/p CABG 05/2019: L-LAD, S-OM1, S-PDA  Paroxysmal atrial fibrillation ? CHADS2-VASc=4 (agex2, HTN, CAD) >> Apixaban  Sick sinus syndrome status post pacemaker  Hypertension  Hyperlipidemia  NSVT  Pulmonary sarcoid  MGUS  Chronic inflammatory demyelinating polyneuropathy  Colon cancer  Derrick [name] was last seen via telemedicine in June 2020.  He saw Dr. Curt [name] in September 2020.He returns for follow-up.  He is here alone.  He still has some chest soreness but this is improving.  He is walking about 3 miles, cumulatively, daily.  He has not had significant shortness of breath.  He has not had significant leg swelling.  He has not had any bleeding issues.  He did bump his leg on the left recently and has a hematoma.  This is improving.  He has had some weakness related to the increase in his atorvastatin.  He thinks that this may be improving somewhat.    Prior CV studies:   The following studies were reviewed today:  Pre CABG Dopplers 06/01/2019 Bilat ICA 1-39   Echo 05/22/2019 EF 55-60, Gr 2 DD, apical HK, severe LAE, mild MAC, mild AS (mean 12), mild to mod dilation of ascending aorta (43 mm)   Cardiac Catheterization 05/15/2019 1. Severe distal left main stenosis, hemodynamically significant by DFR analysis 2. Moderate proximal LAD stenosis 3. Continued patency of the RCA stents 4. Nonobstructive LCx stenosis 5. Mild aortic stenosis with peak-to-peak gradient < 10 mmHg by  pullback Recommend: cardiac surgical consultation for consideration of CABG in this patient with symptomatic left main disease. Last echo 09/2018 should be updated to reevaluate severity of aortic stenosis   Past Medical History:  Diagnosis Date  . Anemia   . BENIGN PROSTATIC HYPERTROPHY, WITH OBSTRUCTION 05/28/2010  . CAD (coronary artery disease)    a. s/p NSTEMI 06/01/11: DES to RCA;  b. cath 06/25/11:   dLM 50-60% (FFR 0.87), prox to mid LAD 40-50%, D1 50%, pCFX 50%, RCA stent ok, dPDA 80%, EF 55-60%.  His FFR was felt to be negative and therefore medical therapy was recommended ;  echo 6/12: EF 55-60%, mild AS   . Chronic back pain   . CIDP (chronic inflammatory demyelinating polyneuropathy) ([place]) 04/11/2012  . Colon cancer ([place]) dx'd 2000   "left"  . COLONIC POLYPS, ADENOMATOUS, HX OF 05/28/2010  . Complication of anesthesia    "stopped breathing; related to my sleep apnea" & urinary retention   . COPD (chronic obstructive pulmonary disease) ([place])    "associated w/lung sarcoidosis"  . 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 DISH (diffuse idiopathic skeletal hyperostosis) 12/02/2015  . Diverticulitis   . GENERALIZED OSTEOARTHROSIS UNSPECIFIED SITE 05/28/2010  . GERD 05/28/2010  . Heart murmur   . History of gout   . History of kidney stones   . HYPERLIPIDEMIA 05/28/2010  . Hypertension   . IgM lambda paraproteinemia   .  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 Malignant neoplasm of descending colon ([place]) 05/28/2010  . MITRAL VALVE PROLAPSE 05/28/2010  . 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 NSTEMI (non-ST elevated myocardial infarction) ([place]) 06/03/11  . 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  . OSA on CPAP 05/28/2010  . PALPITATIONS, CHRONIC 05/28/2010  . Parotid gland pain 2010   infection  . Peripheral neuropathy    "tx'd w/targeted chemo" (05/12/2016)  . Pneumonia 2-3 times  . Presence of permanent cardiac pacemaker   .  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 Sarcoidosis    pulmonalis  . UNSPECIFIED INFLAMMATORY AND TOXIC NEUROPATHY 05/28/2010  . Vision loss   . VITAMIN D DEFICIENCY 05/28/2010   Surgical Hx: The patient  has a past surgical history that includes Lung surgery (2001); Appendectomy (1954); Knee arthroscopy (Right, 2003); Coronary angioplasty with stent (06/03/11); Cataract extraction w/ intraocular lens implant (Left); left heart catheterization with coronary angiogram (N/A, 03/31/2015); Insert / replace / remove pacemaker (05/12/2016); Tonsillectomy (1950); Mohs surgery (Left, ~ 2008); Prostate biopsy (2010); Colon surgery (2000); Cardiac catheterization (N/A, 05/12/2016); Cardiac catheterization (N/A, 05/12/2016); Eye surgery; Inguinal hernia repair (Left, 02/21/2019); LEFT HEART CATH AND CORONARY ANGIOGRAPHY (N/A, 05/15/2019); INTRAVASCULAR PRESSURE WIRE/FFR STUDY (N/A, 05/15/2019); Coronary artery bypass graft (N/A, 06/05/2019); and TEE without cardioversion (N/A, 06/05/2019).   Current Medications: Current Meds  Medication Sig  . amLODipine (NORVASC) 5 MG tablet Take 5 mg by mouth daily.  [name] apixaban (ELIQUIS) 5 MG TABS tablet Take 1 tablet (5 mg total) by mouth 2 (two) times daily.  [name] aspirin EC 81 MG tablet Take 1 tablet (81 mg total) by mouth daily.  [name] atorvastatin (LIPITOR) 40 MG tablet Take 1 tablet (40 mg total) by mouth daily.  . bimatoprost (LUMIGAN) 0.01 % SOLN Place 1 drop into both eyes at bedtime.  . Cholecalciferol 25 MCG (1000 UT) tablet Take 1,000 Units by mouth daily.   . dorzolamide-timolol (COSOPT) 22.3-6.8 MG/ML ophthalmic solution Place 1 drop into both eyes 2 (two) times daily.   . finasteride (PROSCAR) 5 MG tablet Take 5 mg by mouth daily.   . fluticasone (FLONASE) 50 MCG/ACT nasal spray Place 2 sprays into both nostrils 2 (two) times daily.  . Fluticasone Propionate, Inhal, (FLOVENT DISKUS) 100 MCG/BLIST AEPB Inhale 1 Inhaler into the lungs 2 (two) times daily.  . MULTAQ 400 MG tablet TAKE 1  TABLET BY MOUTH TWICE DAILY WITH MEALS  . Multiple Vitamin (MULTIVITAMIN WITH MINERALS) TABS tablet Take 1 tablet by mouth daily.  . pantoprazole (PROTONIX) 40 MG tablet TAKE 1 TABLET BY MOUTH ONCE DAILY  . Polyethyl Glycol-Propyl Glycol (SYSTANE OP) Place 1 drop into both eyes daily as needed (dry eyes).  . tadalafil (CIALIS) 5 MG tablet Take 5 mg by mouth at bedtime.   . triamterene-hydrochlorothiazide (MAXZIDE-25) 37.5-25 MG tablet Take 1 tablet by mouth once daily     Allergies:   Patient has no known allergies.   Social History   Tobacco Use  . Smoking status: Never Smoker  . Smokeless tobacco: Never Used  Substance Use Topics  . Alcohol use: Yes    Comment: occasional  . Drug use: No     Family Hx: The patient's family history includes Arrhythmia (age of onset: 76) in his brother; Arthritis in his mother; Cancer in his brother; Colon polyps in his mother; Congestive Heart Failure in his brother; Coronary artery disease in his brother and mother; Heart attack in his brother; Heart disease  in his mother.  ROS:   Please see the history of present illness.    ROS All other systems reviewed and are negative.   EKGs/Labs/Other Test Reviewed:    EKG:  EKG is not ordered today.  The ekg ordered today demonstrates n/a  Recent Labs: 06/06/2019: Magnesium 2.3 09/07/2019: BUN 16; Creatinine, Ser 1.01; Hemoglobin 10.5; Platelets 192; Potassium 3.9; Sodium 138 09/19/2019: ALT 14   Recent Lipid Panel Lab Results  Component Value Date/Time   CHOL 147 09/19/2019 07:38 AM   TRIG 54 09/19/2019 07:38 AM   HDL 58 09/19/2019 07:38 AM   CHOLHDL 2.5 09/19/2019 07:38 AM   CHOLHDL 2.7 03/19/2016 09:22 AM   LDLCALC 78 09/19/2019 07:38 AM    Physical Exam:    VS:  BP 120/60   Pulse 63   Ht _0  (1.727 m)   Wt 181 lb 12.8 oz (82.5 kg)   SpO2 99%   BMI 27.64 kg/m     Wt Readings from Last 3 Encounters:  10/03/19 181 lb 12.8 oz (82.5 kg)  09/26/19 178 lb (80.7 kg)  09/18/19 177 lb  (80.3 kg)     Physical Exam  Constitutional: He is oriented to person, place, and time. He appears well-developed and well-nourished. No distress.  HENT:  Head: Normocephalic and atraumatic.  Eyes: No scleral icterus.  Neck: No JVD present. No thyromegaly present.  Cardiovascular: Normal rate and regular rhythm.  Murmur heard.  Crescendo-decrescendo systolic murmur is present with a grade of 2/6 at the upper right sternal border. Pulmonary/Chest: Effort normal and breath sounds normal. He has no rales.  Abdominal: Soft. There is no hepatomegaly.  Musculoskeletal:        General: Edema (mod hematoma L anterior tibia with some surrounding edema; no R leg swelling) present.  Lymphadenopathy:    He has no cervical adenopathy.  Neurological: He is alert and oriented to person, place, and time.  Skin: Skin is warm and dry.  Psychiatric: He has a normal mood and affect.    ASSESSMENT & PLAN:    1. Coronary artery disease involving native coronary artery of native heart without angina pectoris History of inferior MI in 2012 treated with drug-eluting stent to the RCA.  He underwent CABG in June 2020.  He is doing well without anginal symptoms.  Continue aspirin, atorvastatin.  2. Paroxysmal atrial fibrillation ([name]) He has been maintaining sinus rhythm.  He is tolerating anticoagulation well.  Continue current dose of Apixaban, Multaq.   3. Cardiac pacemaker in situ Continue follow-up with EP.  4. Essential hypertension, benign The patient's blood pressure is controlled on his current regimen.  Continue current therapy.   5. Mixed hyperlipidemia Recent LDL 78.  He is now on a higher dose of atorvastatin.  Have asked him to contact us if he continues to have weakness or myalgias.  In that case, we could reduce his dose back to either 30 mg and see how he does or switch him to rosuvastatin.     Dispo:  Return in about 6 months (around 04/02/2020) for Routine Follow Up, w/ Dr. Burt Knack,  (virtual or in-person).   Medication Adjustments/Labs and Tests Ordered: Current medicines are reviewed at length with the patient today.  Concerns regarding medicines are outlined above.  Tests Ordered: No orders of the defined types were placed in this encounter.  Medication Changes: No orders of the defined types were placed in this encounter.   Signed, Richardson Dopp, PA-C  10/03/2019 12:37 PM  [facility] [place], [place], [place]  [address] Phone: [phone]; Fax: [phone]

## 2019-10-04 NOTE — Telephone Encounter (Signed)
Pt last saw Richardson Dopp, PA on 10/03/19, last labs 09/07/19 Creat 1.01, age 78, weight 82.5kg, based on specified criteria pt is on appropriate dosage of Eliquis 5mg  BID.  Will refill rx.

## 2019-10-05 ENCOUNTER — Encounter: Payer: Self-pay | Admitting: Family Medicine

## 2019-10-05 ENCOUNTER — Other Ambulatory Visit: Payer: Self-pay

## 2019-10-05 ENCOUNTER — Ambulatory Visit (INDEPENDENT_AMBULATORY_CARE_PROVIDER_SITE_OTHER): Payer: Medicare Other | Admitting: Family Medicine

## 2019-10-05 VITALS — BP 114/62 | HR 69 | Temp 98.1°F | Ht 67.5 in | Wt 181.8 lb

## 2019-10-05 DIAGNOSIS — I208 Other forms of angina pectoris: Secondary | ICD-10-CM

## 2019-10-05 DIAGNOSIS — I25119 Atherosclerotic heart disease of native coronary artery with unspecified angina pectoris: Secondary | ICD-10-CM | POA: Diagnosis not present

## 2019-10-05 DIAGNOSIS — C186 Malignant neoplasm of descending colon: Secondary | ICD-10-CM | POA: Diagnosis not present

## 2019-10-05 DIAGNOSIS — D472 Monoclonal gammopathy: Secondary | ICD-10-CM

## 2019-10-05 DIAGNOSIS — G6181 Chronic inflammatory demyelinating polyneuritis: Secondary | ICD-10-CM | POA: Diagnosis not present

## 2019-10-05 DIAGNOSIS — E785 Hyperlipidemia, unspecified: Secondary | ICD-10-CM

## 2019-10-05 DIAGNOSIS — I480 Paroxysmal atrial fibrillation: Secondary | ICD-10-CM

## 2019-10-05 DIAGNOSIS — I10 Essential (primary) hypertension: Secondary | ICD-10-CM | POA: Diagnosis not present

## 2019-10-05 NOTE — Assessment & Plan Note (Signed)
Recent increase in statin given LDL not < 70  And recent CABG... some SE.. continue to follow.

## 2019-10-05 NOTE — Assessment & Plan Note (Signed)
Remaining fatigue acssociated with recnet CABG.. gradually improving.

## 2019-10-05 NOTE — Patient Instructions (Signed)
Elevate left lower leg, apply bandaid and antibiotic ointment. Call if fever,  Redness spreading in left lower leg.

## 2019-10-05 NOTE — Assessment & Plan Note (Signed)
Released by [place].[name] little chance this will progress to multiple myeloma.

## 2019-10-05 NOTE — Assessment & Plan Note (Signed)
Colonoscopy q 3 years

## 2019-10-05 NOTE — Assessment & Plan Note (Signed)
Rate controlled and on  Eliquis as anticoagulant.

## 2019-10-05 NOTE — Progress Notes (Signed)
Chief Complaint  Patient presents with  . Annual Exam    Part 2    History of Present Illness: HPI   The patient presents for  review of chronic health problems. He/She also has the following acute concerns today: hot left lower leg on iron post. Uptodate with Td.  resulting hematoma, pain but improving.   The patient saw a LPN or RN for medicare wellness visit.  Prevention and wellness was reviewed in detail. Note reviewed and important notes copied below.  MGUS, IgM Dx 2008: Treated with ritoxin for 2 years in [place]. Minimal risk of multiple myeloma.. released from  Oncologist.  Has had neuropathy for many years.. Progressive worsening now in last several months moderate to severe. Possibly from MGUS. Gabapentin controls his symptoms somewhat.. Burning in leg and feet. He is trying to come off this medication given he feels it is making memory worse, fatigue. His memory is somewhat better on the lower dose of gabapentin. MMSE at [facility] slightly decreased.. Follow over time.  DISH: diffuse.. Pain in right knee, hands, back, ankles  Followed by Rheum (Dr. Marijean Bravo). Is considering PT  Repeat.  Exercicing daily.   01/2019 left inguinal hernia repair.. no complication  Dr. Burt Knack cardiology follow pt for CAD,He has a history of myocardial infarction and drug-eluting stent placement in the right coronary arteryin past 04/2019 cath: severe disease.. treated with CABG 06/01/2019 Atrial Fibrillation: dxin 2017. He underwent permanent pacemaker placement in May 2017. He is anticoagulated with Eliquis. He is treated with a rhythm control strategy using Multaq. His CHADS-Vasc score is 3. Last OV reviewed  From 10/03/2019 stable No current anginal symptoms. Cardiac pacemaker in situ.  GERD well controlled on protonix  Hypertension:Well controlled on current regimen: amlodipine, maxide BP Readings from Last 3 Encounters:  10/05/19 114/62  10/03/19 120/60  09/26/19 112/64    No current CP, no SOB. Some swelling  In ankles if on feet.  High cholesterol: Good control last check on lipitor but LDL not < 70..  Now on lipitor 40 mg daily. .. tolerating SE at this point. Lab Results  Component Value Date   CHOL 147 09/19/2019   HDL 58 09/19/2019   LDLCALC 78 09/19/2019   TRIG 54 09/19/2019   CHOLHDL 2.5 09/19/2019   Walking 2 miles daily.  Hyperglycemia/prediabetes Lab Results  Component Value Date   HGBA1C 4.9 06/04/2019    Pulmonary sarcoid: noted in lung during colon cancer dx. Minimal symptoms, no longer on pulmicort. Rarely using albuterol. Only issue is when he gets lung infection.. Needs inhaler.  OSA on CPAP.  Hx of colon cancer 2000  COVID 19 screen No recent travel or known exposure to [place] The patient denies respiratory symptoms of COVID 19 at this time.  The importance of social distancing was discussed today.   Review of Systems  Constitutional: Positive for malaise/fatigue. Negative for chills and fever.  HENT: Negative for congestion and ear pain.   Eyes: Negative for pain and redness.  Respiratory: Negative for cough and shortness of breath.   Cardiovascular: Negative for chest pain, palpitations and leg swelling.  Gastrointestinal: Negative for abdominal pain, blood in stool, constipation, diarrhea, nausea and vomiting.  Genitourinary: Negative for dysuria.  Musculoskeletal: Negative for falls and myalgias.  Skin: Negative for rash.  Neurological: Negative for dizziness.  Psychiatric/Behavioral: Negative for depression. The patient is not nervous/anxious.       Past Medical History:  Diagnosis Date  . Anemia   . BENIGN PROSTATIC HYPERTROPHY,  WITH OBSTRUCTION 05/28/2010  . CAD (coronary artery disease)    a. s/p NSTEMI 06/01/11: DES to RCA;  b. cath 06/25/11:   dLM 50-60% (FFR 0.87), prox to mid LAD 40-50%, D1 50%, pCFX 50%, RCA stent ok, dPDA 80%, EF 55-60%.  His FFR was felt to be negative and therefore medical therapy  was recommended ;  echo 6/12: EF 55-60%, mild AS   . Chronic back pain   . CIDP (chronic inflammatory demyelinating polyneuropathy) ([place] [place]) 04/11/2012  . Colon cancer ([place]) dx'd 2000   "left"  . COLONIC POLYPS, ADENOMATOUS, HX OF 05/28/2010  . Complication of anesthesia    "stopped breathing; related to my sleep apnea" & urinary retention   . COPD (chronic obstructive pulmonary disease) ([place])    "associated w/lung sarcoidosis"  . 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 DISH (diffuse idiopathic skeletal hyperostosis) 12/02/2015  . Diverticulitis   . GENERALIZED OSTEOARTHROSIS UNSPECIFIED SITE 05/28/2010  . GERD 05/28/2010  . Heart murmur   . History of gout   . History of kidney stones   . HYPERLIPIDEMIA 05/28/2010  . Hypertension   . IgM lambda paraproteinemia   . 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 Malignant neoplasm of descending colon ([place]) 05/28/2010  . MITRAL VALVE PROLAPSE 05/28/2010  . 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 NSTEMI (non-ST elevated myocardial infarction) ([place]) 06/03/11  . 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  . OSA on CPAP 05/28/2010  . PALPITATIONS, CHRONIC 05/28/2010  . Parotid gland pain 2010   infection  . Peripheral neuropathy    "tx'd w/targeted chemo" (05/12/2016)  . Pneumonia 2-3 times  . Presence of permanent cardiac pacemaker   . 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 Sarcoidosis    pulmonalis  . UNSPECIFIED INFLAMMATORY AND TOXIC NEUROPATHY 05/28/2010  . Vision loss   . VITAMIN D DEFICIENCY 05/28/2010    reports that he has never smoked. He has never used smokeless tobacco. He reports current alcohol use. He reports that he does not use drugs.   Current Outpatient Medications:  .  amLODipine (NORVASC) 5 MG tablet, Take 5 mg by mouth daily., Disp: , Rfl:  .  aspirin EC 81 MG tablet, Take 1 tablet (81 mg total) by mouth daily., Disp: 150 tablet, Rfl: 2 .  atorvastatin (LIPITOR) 40 MG tablet, Take 1  tablet (40 mg total) by mouth daily., Disp: 90 tablet, Rfl: 3 .  bimatoprost (LUMIGAN) 0.01 % SOLN, Place 1 drop into both eyes at bedtime., Disp: , Rfl:  .  Cholecalciferol 25 MCG (1000 UT) tablet, Take 1,000 Units by mouth daily. , Disp: , Rfl:  .  dorzolamide-timolol (COSOPT) 22.3-6.8 MG/ML ophthalmic solution, Place 1 drop into both eyes 2 (two) times daily. , Disp: , Rfl:  .  ELIQUIS 5 MG TABS tablet, Take 1 tablet by mouth twice daily, Disp: 180 tablet, Rfl: 2 .  finasteride (PROSCAR) 5 MG tablet, Take 5 mg by mouth daily. , Disp: , Rfl:  .  fluticasone (FLONASE) 50 MCG/ACT nasal spray, Place 2 sprays into both nostrils 2 (two) times daily., Disp: , Rfl:  .  Fluticasone Propionate, Inhal, (FLOVENT DISKUS) 100 MCG/BLIST AEPB, Inhale 1 Inhaler into the lungs 2 (two) times daily., Disp: 60 each, Rfl: 1 .  MULTAQ 400 MG tablet, TAKE 1 TABLET BY MOUTH TWICE DAILY WITH MEALS, Disp: 180 tablet, Rfl: 3 .  Multiple Vitamin (MULTIVITAMIN WITH MINERALS) TABS tablet, Take 1 tablet by mouth daily., Disp: , Rfl:  .  pantoprazole (PROTONIX) 40 MG tablet, TAKE 1 TABLET BY MOUTH ONCE DAILY, Disp: 90 tablet, Rfl: 3 .  Polyethyl Glycol-Propyl Glycol (SYSTANE OP), Place 1 drop into both eyes daily as needed (dry eyes)., Disp: , Rfl:  .  tadalafil (CIALIS) 5 MG tablet, Take 5 mg by mouth at bedtime. , Disp: , Rfl:  .  triamterene-hydrochlorothiazide (MAXZIDE-25) 37.5-25 MG tablet, Take 1 tablet by mouth once daily, Disp: 90 tablet, Rfl: 3   Observations/Objective: Blood pressure 114/62, pulse 69, temperature 98.1 F (36.7 C), temperature source Temporal, height 5' 7.5" (1.715 m), weight 181 lb 12 oz (82.4 kg), SpO2 99 %.  Physical Exam Constitutional:      General: He is not in acute distress.    Appearance: Normal appearance. He is well-developed. He is not ill-appearing or toxic-appearing.  HENT:     Head: Normocephalic and atraumatic.     Right Ear: Hearing, tympanic membrane, ear canal and external ear  normal.     Left Ear: Hearing, tympanic membrane, ear canal and external ear normal.     Nose: Nose normal.     Mouth/Throat:     Pharynx: Uvula midline.  Eyes:     General: Lids are normal. Lids are everted, no foreign bodies appreciated.     Conjunctiva/sclera: Conjunctivae normal.     Pupils: Pupils are equal, round, and reactive to light.  Neck:     Musculoskeletal: Normal range of motion and neck supple.     Thyroid: No thyroid mass or thyromegaly.     Vascular: No carotid bruit.     Trachea: Trachea and phonation normal.  Cardiovascular:     Rate and Rhythm: Normal rate and regular rhythm.     Pulses: Normal pulses.     Heart sounds: S1 normal and S2 normal. No murmur. No gallop.   Pulmonary:     Breath sounds: Normal breath sounds. No wheezing, rhonchi or rales.  Abdominal:     General: Bowel sounds are normal.     Palpations: Abdomen is soft.     Tenderness: There is no abdominal tenderness. There is no guarding or rebound.     Hernia: No hernia is present.  Lymphadenopathy:     Cervical: No cervical adenopathy.  Skin:    General: Skin is warm and dry.     Findings: No rash.     Comments: Swelling inleft lower anterior shin...  Laceration, healing with surrounding contusion and minimal erythema and warmth.  Neurological:     Mental Status: He is alert.     Cranial Nerves: No cranial nerve deficit.     Sensory: No sensory deficit.     Gait: Gait normal.     Deep Tendon Reflexes: Reflexes are normal and symmetric.  Psychiatric:        Speech: Speech normal.        Behavior: Behavior normal.        Judgment: Judgment normal.      Assessment and Plan The patient's preventative maintenance and recommended screening tests for an annual wellness exam were reviewed in full today. Brought up to date unless services declined.  Counselled on the importance of diet, exercise, and its role in overall health and mortality. The patient's FH and SH was reviewed, including  their home life, tobacco status, and drug and alcohol status.   Vaccines: TDap, PNA uptodate Prostate Cancer Screen: BPH, hx of biopsy. PSA Followed by Dr. Alinda Money Colon Cancer Screen:  Hx of colon cancer 10/2017 colonoscopy on  3 year recall      Smoking Status: nonsmoker ETOH/ drug use: minimal/ none    Malignant neoplasm of descending colon Colonoscopy q 3 years  Hyperlipidemia with target LDL less than 100 Recent increase in statin given LDL not < 70  And recent CABG... some SE.. continue to follow.  MGUS (monoclonal gammopathy of unknown significance) Released by ONC.[name] little chance this will progress to multiple myeloma.  Coronary artery disease involving native coronary artery of native heart with angina pectoris ([name]) Remaining fatigue acssociated with recnet CABG.. gradually improving.    CIDP (chronic inflammatory demyelinating polyneuropathy) Stable.  Essential hypertension, benign Well controlled. Continue current medication.   AF (atrial fibrillation) ([place]) Rate controlled and on  Eliquis as anticoagulant.     [name], MD

## 2019-10-05 NOTE — Assessment & Plan Note (Signed)
Well controlled. Continue current medication.  

## 2019-10-05 NOTE — Assessment & Plan Note (Signed)
Stable

## 2019-10-19 ENCOUNTER — Telehealth: Payer: Self-pay | Admitting: Cardiology

## 2019-10-19 NOTE — Telephone Encounter (Signed)
New Message        [facility] Pre-operative Risk Assessment    Request for surgical clearance:  1. What type of surgery is being performed? Cateract Surgery  2. When is this surgery scheduled? 10/25/19  3. What type of clearance is required (medical clearance vs. Pharmacy clearance to hold med vs. Both)? Medical  4. Are there any medications that need to be held prior to surgery and how long? No  5. Practice name and name of physician performing surgery? [facility] Dr. Marygrace Drought  6. What is your office phone number  [phone]   7.   What is your office fax number [phone]  8.   Anesthesia type (None, local, MAC, general) ? Topical and IV sedation   Andree Coss 10/19/2019, 10:03 AM  _________________________________________________________________   (provider comments below)

## 2019-10-19 NOTE — Telephone Encounter (Signed)
   Primary Cardiologist: [name], MD  Chart reviewed as part of pre-operative protocol coverage. Cataract extractions are recognized in guidelines as low risk surgeries that do not typically require specific preoperative testing or holding of blood thinner therapy. Therefore, given past medical history and time since last visit, based on ACC/AHA guidelines, Derrick [name] would be at acceptable risk for the planned procedure without further cardiovascular testing.   I will route this recommendation to the requesting party via Epic fax function and remove from pre-op pool.  Please call with questions.  Kathyrn Drown, NP 10/19/2019, 10:21 AM

## 2019-10-22 ENCOUNTER — Telehealth: Payer: Self-pay

## 2019-10-22 DIAGNOSIS — Z85828 Personal history of other malignant neoplasm of skin: Secondary | ICD-10-CM | POA: Diagnosis not present

## 2019-10-22 DIAGNOSIS — D225 Melanocytic nevi of trunk: Secondary | ICD-10-CM | POA: Diagnosis not present

## 2019-10-22 DIAGNOSIS — S8012XA Contusion of left lower leg, initial encounter: Secondary | ICD-10-CM | POA: Diagnosis not present

## 2019-10-22 DIAGNOSIS — L814 Other melanin hyperpigmentation: Secondary | ICD-10-CM | POA: Diagnosis not present

## 2019-10-22 DIAGNOSIS — Z23 Encounter for immunization: Secondary | ICD-10-CM | POA: Diagnosis not present

## 2019-10-22 DIAGNOSIS — E785 Hyperlipidemia, unspecified: Secondary | ICD-10-CM

## 2019-10-22 DIAGNOSIS — D692 Other nonthrombocytopenic purpura: Secondary | ICD-10-CM | POA: Diagnosis not present

## 2019-10-22 DIAGNOSIS — L821 Other seborrheic keratosis: Secondary | ICD-10-CM | POA: Diagnosis not present

## 2019-10-22 MED ORDER — ROSUVASTATIN CALCIUM 10 MG PO TABS: 10.0000 mg | ORAL_TABLET | Freq: Every day | ORAL | 3 refills | Status: DC

## 2019-10-22 NOTE — Telephone Encounter (Signed)
I spoke to the patient with Scott's recommendation.  He verbalized understanding.

## 2019-10-22 NOTE — Telephone Encounter (Signed)
See message from patient. PLAN:  1. DC Atorvastatin 2. Start Rosuvastatin 10 mg once daily  3. Fasting Lipids and LFTs in 3 mos. Richardson Dopp, PA-C    10/22/2019 7:55 AM

## 2019-10-25 DIAGNOSIS — H2511 Age-related nuclear cataract, right eye: Secondary | ICD-10-CM | POA: Diagnosis not present

## 2019-10-25 DIAGNOSIS — H25011 Cortical age-related cataract, right eye: Secondary | ICD-10-CM | POA: Diagnosis not present

## 2019-10-25 DIAGNOSIS — H25811 Combined forms of age-related cataract, right eye: Secondary | ICD-10-CM | POA: Diagnosis not present

## 2019-10-30 NOTE — Telephone Encounter (Signed)
Pt reports he is back in rhythm now. States he was out for 48 hours, but then returned to NSR. States when it was out of rhythm it was so noticeable that he could feel it in his right jugular.   Pt advised to continue to monitor for now and call back if reoccurs. Pt voices that he would be more comfortable sending in a manual transmission.  Advised ok to send and we will look at it tomorrow. Advised to call the office if he feels that he is going out of rhythm more frequently. Patient verbalized understanding and agreeable to plan.

## 2019-11-02 DIAGNOSIS — R972 Elevated prostate specific antigen [PSA]: Secondary | ICD-10-CM | POA: Diagnosis not present

## 2019-11-05 ENCOUNTER — Encounter: Payer: Self-pay | Admitting: Neurology

## 2019-11-05 ENCOUNTER — Ambulatory Visit (INDEPENDENT_AMBULATORY_CARE_PROVIDER_SITE_OTHER): Payer: Medicare Other | Admitting: Neurology

## 2019-11-05 ENCOUNTER — Other Ambulatory Visit: Payer: Self-pay

## 2019-11-05 VITALS — BP 111/64 | HR 67 | Temp 97.4°F | Ht 68.0 in | Wt 185.0 lb

## 2019-11-05 DIAGNOSIS — D472 Monoclonal gammopathy: Secondary | ICD-10-CM | POA: Diagnosis not present

## 2019-11-05 DIAGNOSIS — G63 Polyneuropathy in diseases classified elsewhere: Secondary | ICD-10-CM

## 2019-11-05 DIAGNOSIS — I208 Other forms of angina pectoris: Secondary | ICD-10-CM

## 2019-11-05 NOTE — Progress Notes (Signed)
[facility]   Provider:  Larey [name], Derrick [name]  Referring Provider: [name], MD Primary Care Physician:  Derrick Sanders, MD  Chief Complaint  Patient presents with  . Follow-up    pt alone, rm 10. pt states things are well. no issues or concerns   MGUS neuropathy, ankylsing spondylitis and OSA/ sarcoidosis.   Derrick. 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  MGUS diagnosed there, too. Followed by Hem - Onc.   HPI:   Derrick [name] is a 78 y.o. 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  Last seen by myself here in a revisit  from Derrick. Derrick [name]. His PCP is Derrick. Derrick [name], his ophthalmologist is Derrick. Derrick [name]  . 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  He had been for at least 12 years CPAP treatment at a pressure of 16 cm water,  he was also using a full face mask at the time , which was comfortable.  His original diagnostic sleep study took place in 2000. The patient endorsed  Epworth sleepiness score at the of 24 points in the FSS at an elevated rate of 42 points as well. His BMI at the time was 30. Meanwhile he had lost 65 pounds.   Derrick [name] underwent a sleep study on 11-30-2011. His AHI at time of study was 42.1 and his RDI 45.5 his REM AHI was 30 and non-REM 42.9 he did not have any supine sleep. He had oxygen desaturations to a nadir of 82% and had a total desaturation time of 52.6 minutes with a the first 3 hours of sleep.The results were indicative of severe sleep apnea and  the patient was titrated to CPAP on 12-05-2011 at the pressure of only 8 cm. He had REM rebound at this setting ,the residual AHI in the night  was 0.3 per hour. He also recommended the use of a nasal pillow of possible. The CPAP was download it in 2013 after 90 days of CPAP he was with a new pressure. He had 100% compliance rate and residual AHI of 3.4 with only moderate to mild air leak that pressure was 8 cm water. There are peak time per day was 8 hours and 48 minutes. CPAP still set at 8 cm water with  3 cm EPR.  The air leak has slightly increased the residual AHI is 2.2,  even better than last year. The  therapeutic time is 8 hour and 53 minutes.  The patient has 100% compliance. Today he endorsed Epworth sleepiness score at 0 point and the fatigue severity score at only 15 points.  Geriatric depression score was endorsed at he only scored 1 point not suggestive of clinical depression.  Interval history ;Derrick [name] has also a monoclonal gammopathy and has been treated with rituximab the medication had continuously improved his exercise tolerance, finally he was able to walk 30-40 minutes a day. He has been exercising daily.  He did develop not neuropathic pain,  but some and knee pain on the right and an He has comMRI was now showing DDD on the correspondin spinal level.  Completed his rituximab treatment with Derrick.  [place]. in February 2014 .He underwent a total of 3 steroid injections at the L5-S1 distribution which had meanwhile shown a benefit. He has been on gabapentin for neuropathic pain, seems to be related to his MGUS diagnosis .He had developed a severe cold intolerance progressively actually the last year. He has noticed since March 2014  and since his Retuximab treatment was completed, that he has some cold cramping as well and toe cramps,  pain and numbness.  Last Time I ordered NCS and EMG.   Derrick [name] did a EMG and NCS, and found axonal damage , not a finding of CIDP.  This was after 3 years of MGUS treatment and I answered his questions, "did I ever have CIDP'? I believe ,yes.  His strong family history for neuropathy of  other reasons,imother, brother are affected, is likely a axonal neuropathy and he is not immune to develop this second type.  The patient has hp pain but is ambulatory, he periodically loses control of his foot lifting muscles - stretching exercises help. As to his obstructive sleep apnea, there is no need for any adjustment at this time. His current settings are comfortable and has residual AHI is very low. In office download confirmed high compliance 02-13-14 . He is extremely intolerant to cold temperature, and has anhydrosis - this is more often seen in dysautonomia with NP.  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 Derrick [name]. I would like to follow the patient once a year for the CPAP exclusively for sleep - RV in 12 month . The CPAP  compliance report for 10-16-14 reveals a AHI of  1.2 and daily user time of  9 hours and 14 minutes, and 100% over 4 hour compliance over 4 hours of nightly use.  He has spinal stenosis - 4 times daily 400 mg neurontin.  A revisit will be scheduled in 12 months. Labs , CBC and Diff by oncologist. Cold agglutinin ordered. Plasma -Electrophoresis reordered.   03-26-15  Interval history Derrick [name] reports that he was recently babysitting the grandchildren in [place] and has noticed that he has less stamina less strength especially in his proximal leg muscles, and he was just yesterday told that he had an abnormal cardiac stress test. He has known coronary artery disease and a stent in the right main coronary artery he was now diagnosed with coronary artery disease in the left main. Catheterization is planned for Monday. As to his obstructive sleep apnea he is doing very well his compliance report from 03-26-15 shows 97% compliance for days of use and for 4 hours of use the average therapy time is 8 hours and 39 minutes at 9 cm water pressure with 2 cm EPR, residual AHI is 1.7. Fatigue severity score is 23 and Epworth sleepiness score is 1. the patient recently has had some  desire to take some daytime naps which she has not had for a long time or ever. Back from [place] , he noted a black toe nail, without history of trauma. Was this related to blood clot? Its only the nail , not the whole toe. It is tender to touch. 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 He has followed up with sinus practitioner Derrick [name].The patient has been a compliant CPAP use rand today's download confirms this with 100% compliance rate  up until October 21, 9 hours 8 minutes of average nocturnal use, said pressure is 9 cm water with 2 cm EPR residual AHI is 4.8 partially due to air leaks. There are some residual obstructive apneas noted. There is no treatment emergent central apnea noted. Derrick [name] needs a new CPAP machine which I would like to be an auto titrate capable CPAP.  Further developments : Ventricular tachycardia. Aortic stenosis. CAD. Cardiologist is Derrick Burt Knack, electrophysiologist Derrick. Garlan Fillers. MGUS- Neuropathy with falls and stumbling, gabapentin controls pain.  His Epworth score is endorsed at one point his fatigue severity at 29 points and his depression score at 1 out of 15 points.   Interval history from 26 January 2018, before he had a good Christmas time with his family, visiting grandchildren, and is here today for a routine visit dedicated to CPAP compliance and memory concerns.  The patient is a highly compliant patient using the machine average 8 hours and 7 minutes, and used the machine for the last 25 out of 30 days, has another CPAP at his second home which she used on the remaining 5 days.  Residual AHI is 1.7, there are mild to moderate leaks, the AutoSet is allowing a pressure between 5 and 12 cmH2O with 3 cm EPR;  there is no evidence of central apnea emerging.  This is a very good report. Part 2 of  today's meeting is dedicated to his concerns about cognitive function; the patient participated in a Montreal cognitive assessment, reaching 24 out of 30 points.  The patient mentioned that he took gabapentin just about an hour ago and that he feels quite sedated cognitively slowed by this medication.  Most importantly was fully oriented, serial 7 did very well, could not name enough F words ,but 3 out of 5 recall words. He reports trouble with names and nouns.    Derrick [name] is a 78 y.o. 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 He has atrial fibrillation. He has tapered off gabapentin and feels wobbly, but not in pain. He can walk 2 miles a day with his wife. He hasn't seen the grandchildren since February.    He reports recently having had cardiac triple vessel bypass in 05-2019 Derrick [name]) , inguinal hernia surgery in 2-20202 (Derrick. Brantley Stage) , Cataract surgery on October 30th 2020.  He has 20-20 distance vision , but needs reading spects.  Complex Sleep Apnea- has a new download here.   The compliance data are excellent the patient uses the machine 29 out of 30 days the recorded period ended on the  night of 5 November-  this is a compliance of 100% each night is over 8 hours, with an average use at time of 9 hours 59 minutes, he is using an AutoSet CPAP between 5 and 15 cmH2O and 3 cm EPR level his residual AHI is 3.4 consisting mostly of obstructive and very few central apneas but there has been no Cheyne-Stokes respiration.  Based on this I would like for him to continue his current treatment he has very minimal air leakage through the current mask so the model would not have to be changed and the 95th percentile pressure need is determined at 11 cmH2O which is right within his current window.   Review of Systems: Out of a complete 14 system review, the patient complains of only the following symptoms, and all other reviewed systems are  negative. No residual daytime sleepiness, his sleep  on CPAP has been restorative/ refreshing - he is highly compliant between 2 machines. He reports no nocturia. He describes difficulties delays in naming objects or finding names to people he knows.  Usually these come to him either with help or just after delay.  He reports that gabapentin does affect his cognition He reports ongoing problems probably worsening problems with his hands " falling asleep" and  feeling tingly and numb.  Still cold intolerant, attributed to Eloquis, bruising.  Very Cold intolerant-   Bradycardia has been treated with a pacemaker.  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  Hoarseness.  Numbness and tingling. Has rare palplitations. Last atrial fib occurred after bypass surgery.     Social History   Socioeconomic History  . Marital status: Married    Spouse name: Not on file  . Number of children: 2  . Years of education: MS-Coll.  . Highest education level: Not on file  Occupational History  . Occupation: Optometrist    Comment: Building services engineer  Social Needs  . Financial resource strain: Not hard at all  . Food insecurity    Worry: Never true    Inability: Never true  . Transportation needs    Medical: No    Non-medical: No  Tobacco Use  . Smoking status: Never Smoker  . Smokeless tobacco: Never Used  Substance and Sexual Activity  . Alcohol use: Yes    Comment: occasional  . Drug use: No  . Sexual activity: Yes    Birth control/protection: None  Lifestyle  . Physical activity    Days per week: 7 days    Minutes per session: 30 min  . Stress: Not at all  Relationships  . Social Herbalist on phone: Not on file    Gets together: Not on file    Attends religious service: Not on file    Active member of club or organization: Not on file    Attends meetings of clubs or organizations: Not on file    Relationship status: Not on file  . Intimate  partner violence    Fear of current or ex partner: No    Emotionally abused: No    Physically abused: No    Forced sexual activity: No  Other Topics Concern  . Not on file  Social History Narrative   College- [employer]; [employer]-environment science and mgt. Married Derrick [name])  "17" 1 son- 4; 1 dtr "45" 2 grandchildren. 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 End of life-provided discussive context and provided packet.  Social research officer, government, Counselling psychologist, worked at [employer] in [place], and all over the world.   Family History  Problem Relation Age of Onset  . Arthritis Mother        severe  . Coronary artery disease Mother   . Colon polyps Mother   . Heart disease Mother   . Coronary artery disease Brother   . Cancer Brother        choriocarcinoma  . Arrhythmia Brother 49       Afib/Tachycardia  . Heart attack Brother   . Congestive Heart Failure Brother     Past Medical History:  Diagnosis Date  . Anemia   . BENIGN PROSTATIC HYPERTROPHY, WITH OBSTRUCTION 05/28/2010  . CAD (coronary artery disease)    a. s/p NSTEMI 06/01/11: DES to RCA;  b. cath 06/25/11:   dLM 50-60% (FFR 0.87), prox to mid LAD 40-50%, D1 50%, pCFX 50%, RCA stent ok, dPDA  80%, EF 55-60%.  His FFR was felt to be negative and therefore medical therapy was recommended ;  echo 6/12: EF 55-60%, mild AS   . Chronic back pain   . CIDP (chronic inflammatory demyelinating polyneuropathy) ([facility]) 04/11/2012  . Colon cancer ([facility]) dx'[name] 2000   "left"  . COLONIC POLYPS, ADENOMATOUS, HX OF 05/28/2010  . Complication of anesthesia    "stopped breathing; related to my sleep apnea" & urinary retention   . COPD (chronic obstructive pulmonary disease) ([facility])    "associated w/lung sarcoidosis"  . 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 DISH (diffuse idiopathic skeletal hyperostosis) 12/02/2015  . Diverticulitis   . GENERALIZED OSTEOARTHROSIS UNSPECIFIED SITE 05/28/2010  . GERD 05/28/2010  . Heart murmur   . History of gout   .  History of kidney stones   . HYPERLIPIDEMIA 05/28/2010  . Hypertension   . IgM lambda paraproteinemia   . 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 Malignant neoplasm of descending colon ([facility]) 05/28/2010  . MITRAL VALVE PROLAPSE 05/28/2010  . 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 NSTEMI (non-ST elevated myocardial infarction) ([facility]) 06/03/11  . 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  . OSA on CPAP 05/28/2010  . PALPITATIONS, CHRONIC 05/28/2010  . Parotid gland pain 2010   infection  . Peripheral neuropathy    "tx'[name] w/targeted chemo" (05/12/2016)  . Pneumonia 2-3 times  . Presence of permanent cardiac pacemaker   . 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 Sarcoidosis    pulmonalis  . UNSPECIFIED INFLAMMATORY AND TOXIC NEUROPATHY 05/28/2010  . Vision loss   . VITAMIN [name] DEFICIENCY 05/28/2010    Past Surgical History:  Procedure Laterality Date  . APPENDECTOMY  1954  . CATARACT EXTRACTION W/ INTRAOCULAR LENS IMPLANT Left   . COLON SURGERY  2000   decending colon   . CORONARY ANGIOPLASTY WITH STENT PLACEMENT  06/03/11  . CORONARY ARTERY BYPASS GRAFT N/A 06/05/2019   Procedure: CORONARY ARTERY BYPASS GRAFTING (CABG) TIMES THREE USING LEFT INTERNAL MAMMARY ARTERY AND RIGHT GREATER SEPHANOUS VEIN;  Surgeon: [name], MD;  Location: [facility];  Service: Open Heart Surgery;  Laterality: N/A;  . ELECTROPHYSIOLOGIC STUDY N/A 05/12/2016   Procedure: Cardioversion;  Surgeon: [name], MD;  Location: [facility];  Service: Cardiovascular;  Laterality: N/A;  . EP IMPLANTABLE DEVICE N/A 05/12/2016   Procedure: Pacemaker Implant;  Surgeon: [name], MD;  Location: [facility];  Service: Cardiovascular;  Laterality: N/A;  . EYE SURGERY    . INGUINAL HERNIA REPAIR Left 02/21/2019   Procedure: LEFT INGUINAL HERNIA REPAIR WITH MESH;  Surgeon: [name], MD;  Location: [facility];  Service: General;  Laterality: Left;  .  INSERT / REPLACE / REMOVE PACEMAKER  05/12/2016  . INTRAVASCULAR PRESSURE WIRE/FFR STUDY N/A 05/15/2019   Procedure: INTRAVASCULAR PRESSURE WIRE/FFR STUDY;  Surgeon: [name], MD;  Location: [facility];  Service: Cardiovascular;  Laterality: N/A;  . KNEE ARTHROSCOPY Right 2003  . LEFT HEART CATH AND CORONARY ANGIOGRAPHY N/A 05/15/2019   Procedure: LEFT HEART CATH AND CORONARY ANGIOGRAPHY;  Surgeon: [name], MD;  Location: [facility];  Service: Cardiovascular;  Laterality: N/A;  . LEFT HEART CATHETERIZATION WITH CORONARY ANGIOGRAM N/A 03/31/2015   Procedure: LEFT HEART CATHETERIZATION WITH CORONARY ANGIOGRAM;  Surgeon: [name], MD;  Location: [facility];  Service: Cardiovascular;  Laterality: N/A;  . LUNG SURGERY  2001   "open lung dissection"  . MOHS  SURGERY Left ~ 2008   ear  . PROSTATE BIOPSY  2010  . TEE WITHOUT CARDIOVERSION N/A 06/05/2019   Procedure: TRANSESOPHAGEAL ECHOCARDIOGRAM (TEE);  Surgeon: [name], [name], MD;  Location: [facility];  Service: Open Heart Surgery;  Laterality: N/A;  . TONSILLECTOMY  1950    Current Outpatient Medications  Medication Sig Dispense Refill  . amLODipine (NORVASC) 5 MG tablet Take 5 mg by mouth daily.    [name] aspirin EC 81 MG tablet Take 1 tablet (81 mg total) by mouth daily. 150 tablet 2  . bimatoprost (LUMIGAN) 0.01 % SOLN Place 1 drop into both eyes at bedtime.    . Cholecalciferol 25 MCG (1000 UT) tablet Take 1,000 Units by mouth daily.     . dorzolamide-timolol (COSOPT) 22.3-6.8 MG/ML ophthalmic solution Place 1 drop into both eyes 2 (two) times daily.     [name] ELIQUIS 5 MG TABS tablet Take 1 tablet by mouth twice daily 180 tablet 2  . finasteride (PROSCAR) 5 MG tablet Take 5 mg by mouth daily.     . fluticasone (FLONASE) 50 MCG/ACT nasal spray Place 2 sprays into both nostrils 2 (two) times daily.    . Fluticasone Propionate, Inhal, (FLOVENT DISKUS) 100 MCG/BLIST AEPB Inhale 1 Inhaler into the lungs 2 (two) times daily.  60 each 1  . MULTAQ 400 MG tablet TAKE 1 TABLET BY MOUTH TWICE DAILY WITH MEALS 180 tablet 3  . Multiple Vitamin (MULTIVITAMIN WITH MINERALS) TABS tablet Take 1 tablet by mouth daily.    . pantoprazole (PROTONIX) 40 MG tablet TAKE 1 TABLET BY MOUTH ONCE DAILY 90 tablet 3  . Polyethyl Glycol-Propyl Glycol (SYSTANE OP) Place 1 drop into both eyes daily as needed (dry eyes).    . rosuvastatin (CRESTOR) 10 MG tablet Take 1 tablet (10 mg total) by mouth daily. 90 tablet 3  . tadalafil (CIALIS) 5 MG tablet Take 5 mg by mouth at bedtime.     . triamterene-hydrochlorothiazide (MAXZIDE-25) 37.5-25 MG tablet Take 1 tablet by mouth once daily 90 tablet 3   No current facility-administered medications for this visit.     Allergies as of 11/05/2019  . (No Known Allergies)    Vitals: BP 111/64   Pulse 67   Temp (!) 97.4 F (36.3 C)   Ht 5\' 8"  (1.727 m)   Wt 185 lb (83.9 kg)   BMI 28.13 kg/m  Last Weight:  Wt Readings from Last 1 Encounters:  11/05/19 185 lb (83.9 kg)   Last Height:   Ht Readings from Last 1 Encounters:  11/05/19 5\' 8"  (1.727 m)    Physical exam:  General: The patient is awake, alert and appears not in acute distress. The patient is well groomed. Head: Normocephalic, atraumatic. Neck is supple.  Mallampati 1, neck circumference: 16.75 " No lymph node swelling, no parotid swelling.  Cardiovascular: irregular rate and rhythm, with mild syst. murmurs ,translated left carotid bruit, and without distended neck veins. Respiratory: Lungs are clear to auscultation. Skin:  Without evidence of edema, or rash Trunk: BMI is reduced, the patient lost over 60 pounds over last 24 month, as a result of cardiac rehabilitation. Neurologic exam : The patient is awake and alert, oriented to place and time.   Memory subjective described as intact.  There is a normal attention span & concentration ability.   Montreal Cognitive Assessment  07/26/2018 01/26/2018  Visuospatial/ Executive  (0/5) 2 4  Naming (0/3) 3 3  Attention: Read list of digits (0/2) 2 1  Attention: Read list of letters (0/1) 1 1  Attention: Serial 7 subtraction starting at 100 (0/3) 3 3  Language: Repeat phrase (0/2) 2 1  Language : Fluency (0/1) 1 0  Abstraction (0/2) 2 2  Delayed Recall (0/5) 4 3  Orientation (0/6) 5 6  Total 25 24   MMSE - Mini Mental State Exam 09/26/2019 06/13/2018  Orientation to time 5 5  Orientation to Place 5 5  Registration 3 3  Attention/ Calculation 5 0  Recall 2 1  Recall-comments - unable to recall 2 of 3 words  Language- name 2 objects - 0  Language- repeat 1 1  Language- follow 3 step command - 3  Language- read & follow direction - 0  Write a sentence - 0  Copy design - 0  Total score - 18    Speech is fluent without  Dysarthria, but dysphonia . Mood and affect are concerned.   Cranial nerves: Taste and smell have changed- he reports reduced ability to smell, taste is blunted.  Pupils are equal and briskly reactive to light. Extraocular movements  in vertical and horizontal planes intact and without nystagmus. Visual fields by finger perimetry are intact. Hearing to finger rub intact.  Facial sensation intact to fine touch. Facial motor strength is symmetric and tongue and uvula move midline. Motor exam:  Normal tone and normal muscle bulk and symmetric normal strength in all extremities.  Good grip strength is noted.  Arthritis and carpal tunnel in both hands.  He has a foot drop on the left foot, that is slowly and steadily progressed since 2013.  Sensory:  Fine touch, pinprick and vibration were decreased-.The patient has hyperesthesia in both planta pedis.  There is a decrease of vibratory sense at the ankle level, but the patient reports profound numbness - He also has lost proprioception , more recently this seems to to some degree affect his hands. Coordination: Finger-to-nose maneuver tested and normal without evidence of ataxia, dysmetria or tremor.  No change in handwriting .   Gait and station: Patient walks without assistive device and is able and assisted stool climb up to the exam table. Strength within normal limits. Left foot drop, he is slow- turns with 4-5 steps, rather fragmented. Stance is wide based, he is not swaying - he needs to look where he steps, lost his " sense of where in space "- Romberg testing remains  positive. Deep tendon reflexes: in the upper and lower extremities are attenuated , he has left foot drop . Babinski maneuver response is equivocal.   Rituximab has reduced the patient's arthritic symptoms and has certainly beneficial effects on ankylosing spondylitis, the MGUS may well be associated with these autoimmune disorders at baseline.  His neuropathy also did not progress during the rituximab treatment. His last treatment was given in April  2014 and he is now on a " drug holiday" for over 3 years .   Assessment: 11-05-2019.  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 Derrick [name].  His hyperpathia, hyperesthesias and dyseasthesias have increased. He is not cramping- and he weaned off gabapentin.  He has ankylosing spondylitis and axonal neuropathy will likely progress if he is not treated with a specific immune modulation.   OSA- auto CPAP continuously needed, given his cardiac problems- and he has for years shown 100% compliance.  He had a very new machine 2019  ,CPAP autoset 5-11 cm . New DME is [facility].  Epworth score is 3 points, fatigue severity 38 points.  MCI-  Mr. Mumpower also underwent a Montreal cognitive assessment test today , this time he scored 29 out of 30 points.  This may have been a result of gabapentin, He also mentions that he was cognitively slowed after the intake of 800 mg of gabapentin.   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  Bypass surgery with atrial fib relapse-  Pacemaker patient since 5/ 2017- CAD , MI  , bradycardia.  RV in 12 month.    Derrick Seat, MD   cc Derrick. Burt Knack cc Derrick. Alvy Bimler cc Derrick. @[facility]

## 2019-11-09 DIAGNOSIS — N5201 Erectile dysfunction due to arterial insufficiency: Secondary | ICD-10-CM | POA: Diagnosis not present

## 2019-11-09 DIAGNOSIS — N401 Enlarged prostate with lower urinary tract symptoms: Secondary | ICD-10-CM | POA: Diagnosis not present

## 2019-11-09 DIAGNOSIS — R3121 Asymptomatic microscopic hematuria: Secondary | ICD-10-CM | POA: Diagnosis not present

## 2019-11-09 DIAGNOSIS — R3912 Poor urinary stream: Secondary | ICD-10-CM | POA: Diagnosis not present

## 2019-11-09 DIAGNOSIS — R972 Elevated prostate specific antigen [PSA]: Secondary | ICD-10-CM | POA: Diagnosis not present

## 2019-12-11 ENCOUNTER — Other Ambulatory Visit: Payer: Self-pay | Admitting: Family

## 2019-12-12 ENCOUNTER — Other Ambulatory Visit: Payer: Medicare Other

## 2019-12-12 MED ORDER — ALBUTEROL SULFATE HFA 108 (90 BASE) MCG/ACT IN AERS: 2.0000 | INHALATION_SPRAY | Freq: Four times a day (QID) | RESPIRATORY_TRACT | 2 refills | Status: DC | PRN

## 2019-12-12 MED ORDER — FLOVENT DISKUS 100 MCG/BLIST IN AEPB: 1.0000 | INHALATION_SPRAY | Freq: Two times a day (BID) | RESPIRATORY_TRACT | 2 refills | Status: DC

## 2019-12-24 ENCOUNTER — Ambulatory Visit (INDEPENDENT_AMBULATORY_CARE_PROVIDER_SITE_OTHER): Payer: Medicare Other | Admitting: *Deleted

## 2019-12-24 DIAGNOSIS — I480 Paroxysmal atrial fibrillation: Secondary | ICD-10-CM | POA: Diagnosis not present

## 2019-12-24 LAB — CUP PACEART REMOTE DEVICE CHECK
Battery Remaining Longevity: 64 mo
Battery Voltage: 3 V
Brady Statistic AP VP Percent: 94.76 %
Brady Statistic AP VS Percent: 0 %
Brady Statistic AS VP Percent: 5.23 %
Brady Statistic AS VS Percent: 0 %
Brady Statistic RA Percent Paced: 94.55 %
Brady Statistic RV Percent Paced: 99.8 %
Date Time Interrogation Session: 20201228150905
Implantable Lead Implant Date: 20170517
Implantable Lead Implant Date: 20170517
Implantable Lead Location: 753859
Implantable Lead Location: 753860
Implantable Lead Model: 5076
Implantable Lead Model: 5076
Implantable Pulse Generator Implant Date: 20170517
Lead Channel Impedance Value: 342 Ohm
Lead Channel Impedance Value: 399 Ohm
Lead Channel Impedance Value: 456 Ohm
Lead Channel Impedance Value: 570 Ohm
Lead Channel Pacing Threshold Amplitude: 0.75 V
Lead Channel Pacing Threshold Amplitude: 0.875 V
Lead Channel Pacing Threshold Pulse Width: 0.4 ms
Lead Channel Pacing Threshold Pulse Width: 0.4 ms
Lead Channel Sensing Intrinsic Amplitude: 1.375 mV
Lead Channel Sensing Intrinsic Amplitude: 1.375 mV
Lead Channel Sensing Intrinsic Amplitude: 7.875 mV
Lead Channel Sensing Intrinsic Amplitude: 7.875 mV
Lead Channel Setting Pacing Amplitude: 2 V
Lead Channel Setting Pacing Amplitude: 2.5 V
Lead Channel Setting Pacing Pulse Width: 0.4 ms
Lead Channel Setting Sensing Sensitivity: 2.8 mV

## 2020-01-02 ENCOUNTER — Other Ambulatory Visit: Payer: Self-pay | Admitting: Cardiovascular Disease

## 2020-01-02 DIAGNOSIS — I4891 Unspecified atrial fibrillation: Secondary | ICD-10-CM

## 2020-01-11 ENCOUNTER — Ambulatory Visit: Payer: Medicare Other | Attending: Internal Medicine

## 2020-01-11 DIAGNOSIS — Z23 Encounter for immunization: Secondary | ICD-10-CM | POA: Diagnosis not present

## 2020-01-11 NOTE — Progress Notes (Signed)
   Covid-19 Vaccination Clinic  Name:  Derrick [name]    MRN: [id number] DOB: 15-Feb-1941  01/11/2020  [name] was observed post Covid-19 immunization for 15 minutes without incidence. He was provided with Vaccine Information Sheet and instruction to access the V-Safe system.   [name] was instructed to call 911 with any severe reactions post vaccine: [name] Difficulty breathing  . Swelling of your face and throat  . A fast heartbeat  . A bad rash all over your body  . Dizziness and weakness    Immunizations Administered    Name Date Dose VIS Date Route   Pfizer COVID-19 Vaccine 01/11/2020 10:40 AM 0.3 mL 12/07/2019 Intramuscular   Manufacturer: [employer], [employer]   Lot: [id number]   [place]: [id number]

## 2020-01-21 ENCOUNTER — Telehealth: Payer: Self-pay | Admitting: Neurology

## 2020-01-21 NOTE — Telephone Encounter (Signed)
Pt has called to report that [facility] is waiting on a new script for his CPAP supplies, please reply.

## 2020-01-22 ENCOUNTER — Other Ambulatory Visit: Payer: Self-pay

## 2020-01-22 ENCOUNTER — Other Ambulatory Visit: Payer: Medicare Other

## 2020-01-22 DIAGNOSIS — E785 Hyperlipidemia, unspecified: Secondary | ICD-10-CM

## 2020-01-22 LAB — LIPID PANEL
Chol/HDL Ratio: 2.4 ratio (ref 0.0–5.0)
Cholesterol, Total: 155 mg/dL (ref 100–199)
HDL: 64 mg/dL (ref >39)
LDL Chol Calc (NIH): 76 mg/dL (ref 0–99)
Triglycerides: 82 mg/dL (ref 0–149)
VLDL Cholesterol Cal: 15 mg/dL (ref 5–40)

## 2020-01-22 LAB — HEPATIC FUNCTION PANEL
ALT: 11 IU/L (ref 0–44)
AST: 19 IU/L (ref 0–40)
Albumin: 4.4 g/dL (ref 3.7–4.7)
Alkaline Phosphatase: 53 IU/L (ref 39–117)
Bilirubin Total: 0.4 mg/dL (ref 0.0–1.2)
Bilirubin, Direct: 0.16 mg/dL (ref 0.00–0.40)
Total Protein: 6.7 g/dL (ref 6.0–8.5)

## 2020-01-22 NOTE — Telephone Encounter (Signed)
This script was completed and sent to [facility] for the patient

## 2020-01-30 ENCOUNTER — Telehealth: Payer: Self-pay | Admitting: *Deleted

## 2020-01-30 NOTE — Telephone Encounter (Signed)
Dr [name] I did advised patient through [facility] message that he will need an appointment for surgical clearance.

## 2020-01-30 NOTE — Telephone Encounter (Signed)
   [facility] Pre-operative Risk Assessment    Request for surgical clearance:  1. What type of surgery is being performed? RIGHT TOTAL KNEE ARTHROPLASTY   2. When is this surgery scheduled? 03/11/20   3. What type of clearance is required (medical clearance vs. Pharmacy clearance to hold med vs. Both)? BOTH  4. Are there any medications that need to be held prior to surgery and how long? ELIQUIS   5. Practice name and name of physician performing surgery? [facility]; DR. Redwood   6. What is your office phone number [phone]    7.   What is your office fax number [phone]  8.   Anesthesia type (None, local, MAC, general) ? SPINAL   Julaine Hua 01/30/2020, 1:01 PM  _________________________________________________________________   (provider comments below)

## 2020-01-30 NOTE — Telephone Encounter (Signed)
   Primary Cardiologist: [name], MD  Chart reviewed as part of pre-operative protocol coverage. I called and spoke with patient today and he is doing well from a cardiac standpoint. No cardiac symptoms and able to complete >4.0 METS. Given past medical history and time since last visit, based on ACC/AHA guidelines, Donavan Olm would be at acceptable risk for the planned procedure without further cardiovascular testing.   Per Pharmacy, "Ok to hold Eliquis for 3 days prior to procedure." This should be restarted as soon as able following procedure.  Also OK to hold Aspirin if needed but this should also be restarted as soon as able following procedure.   I will route this recommendation to the requesting party via Epic fax function and remove from pre-op pool.  Please call with questions.  Darreld Mclean, PA-C 01/30/2020, 4:41 PM

## 2020-01-30 NOTE — Telephone Encounter (Signed)
Pt takes Eliquis for afib with CHADS2VASc score of 4 (age x2, HTN, CAD). Renal function is normal. Ok to hold Eliquis for 3 days prior to procedure.

## 2020-01-30 NOTE — Telephone Encounter (Signed)
Hi Dr. Burt Knack,  Mr. Saxman is scheduled to have right total knee arthroplasty on 03/11/2020. Request form did not mention anything about Aspirin but patient said their office will sometimes have patient hold it. Are you OK with patient holding Aspirin prior to surgery. He has a history of CAD s/p CABG x3 in 05/2019, paroxysmal atrial fibrillation on Eliquis, SSS s/p PPM, hypertension, HLD, pulmonary sarcoidosis. He was last seen by Scott in 09/2019 at which time he was doing relatively well. I called and spoke with patient today and he continues to do well from a cardiac standpoint. No cardiac symptoms and walking 2 miles every day with no angina or shortness of breath.   I told patient he was OK to proceed with surgery from our standpoint but just wanted to check on your recommendations for holding Aspirin. I also just wanted to confirm that surgery is OK at this point since he is less than 1 year out from CABG (even though he denies any symptoms).  Please route response back to P CV DIV PREOP.  Thank you! Aashka Salomone

## 2020-01-30 NOTE — Telephone Encounter (Signed)
Yes this is fine thanks. Ok to proceed with surgery and to hold aspirin.

## 2020-02-01 ENCOUNTER — Encounter: Payer: Self-pay | Admitting: Family Medicine

## 2020-02-01 ENCOUNTER — Ambulatory Visit: Payer: Medicare Other | Attending: Internal Medicine

## 2020-02-01 ENCOUNTER — Ambulatory Visit (INDEPENDENT_AMBULATORY_CARE_PROVIDER_SITE_OTHER): Payer: Medicare Other | Admitting: Family Medicine

## 2020-02-01 ENCOUNTER — Other Ambulatory Visit: Payer: Self-pay

## 2020-02-01 VITALS — BP 108/60 | HR 73 | Temp 97.7°F | Ht 67.5 in | Wt 186.0 lb

## 2020-02-01 DIAGNOSIS — R7309 Other abnormal glucose: Secondary | ICD-10-CM

## 2020-02-01 DIAGNOSIS — I480 Paroxysmal atrial fibrillation: Secondary | ICD-10-CM | POA: Diagnosis not present

## 2020-02-01 DIAGNOSIS — Z01818 Encounter for other preprocedural examination: Secondary | ICD-10-CM | POA: Diagnosis not present

## 2020-02-01 DIAGNOSIS — Z23 Encounter for immunization: Secondary | ICD-10-CM | POA: Insufficient documentation

## 2020-02-01 DIAGNOSIS — Z87898 Personal history of other specified conditions: Secondary | ICD-10-CM | POA: Diagnosis not present

## 2020-02-01 LAB — POC URINALSYSI DIPSTICK (AUTOMATED)
Bilirubin, UA: NEGATIVE
Glucose, UA: NEGATIVE
Ketones, UA: NEGATIVE
Leukocytes, UA: NEGATIVE
Nitrite, UA: NEGATIVE
Protein, UA: NEGATIVE
Spec Grav, UA: 1.02 (ref 1.010–1.025)
Urobilinogen, UA: 0.2 E.U./dL
pH, UA: 6.5 (ref 5.0–8.0)

## 2020-02-01 NOTE — Patient Instructions (Signed)
Please stop at the lab to have labs drawn.  

## 2020-02-01 NOTE — Assessment & Plan Note (Signed)
Risk factors for complications from surgery: OSA, CAD, age, afib on anticoagulants Cleared by cardiology Oropharynx clear and open.  No pulmonary concerns.  Eval A1C given history of slightly elevated glucose.  UA clear except chronic hematuria from eliquis, neg eval. Has chronic anemia.[name] will eval.. followed by Heme.. not clearly contraindication to surgery.

## 2020-02-01 NOTE — Progress Notes (Addendum)
Chief Complaint  Patient presents with  . Pre-Surgical Clearance    Right Total Knee Replacement scheudled for 03-11-20.    History of Present Illness: HPI  79 year old male presents for preop clearance with upcoming TKR surgery by Dr. Alvan Dame.   Given cardiac history .. cleared by Dr. Burt Knack. Instructed to hold eliquis x 3 day pre op, as well as aspirin.  He reports no acute issues other than knee pain.   He denies CP, SOB, N/V, D/C.  No fever.  Walking 2 miles every day..   Needs eval with labs and UA.   Has history of hematuria, eval completed by Uro, Dr.Borden... felt to be due to Eliquis and BPH.  S/P cytoscopy..   With past  surgery.. no complications. No anesthesia issues.  HX of Sleep Apnea.  This visit occurred during the SARS-CoV-2 public health emergency.  Safety protocols were in place, including screening questions prior to the visit, additional usage of staff PPE, and extensive cleaning of exam room while observing appropriate contact time as indicated for disinfecting solutions.   COVID 19 screen:  No recent travel or known exposure to COVID19 The patient denies respiratory symptoms of COVID 19 at this time. The importance of social distancing was discussed today.     Review of Systems  Constitutional: Negative for chills and fever.  HENT: Negative for congestion and ear pain.   Eyes: Negative for pain and redness.  Respiratory: Negative for cough and shortness of breath.   Cardiovascular: Negative for chest pain, palpitations and leg swelling.  Gastrointestinal: Negative for abdominal pain, blood in stool, constipation, diarrhea, nausea and vomiting.  Genitourinary: Negative for dysuria.  Musculoskeletal: Positive for joint pain. Negative for falls and myalgias.  Skin: Negative for rash.  Neurological: Negative for dizziness.  Psychiatric/Behavioral: Negative for depression. The patient is not nervous/anxious.       Past Medical History:  Diagnosis  Date  . Anemia   . BENIGN PROSTATIC HYPERTROPHY, WITH OBSTRUCTION 05/28/2010  . CAD (coronary artery disease)    a. s/p NSTEMI 06/01/11: DES to RCA;  b. cath 06/25/11:   dLM 50-60% (FFR 0.87), prox to mid LAD 40-50%, D1 50%, pCFX 50%, RCA stent ok, dPDA 80%, EF 55-60%.  His FFR was felt to be negative and therefore medical therapy was recommended ;  echo 6/12: EF 55-60%, mild AS   . Chronic back pain   . 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 Colon cancer ([place]) dx'd 2000   "left"  . COLONIC POLYPS, ADENOMATOUS, HX OF 05/28/2010  . Complication of anesthesia    "stopped breathing; related to my sleep apnea" & urinary retention   . COPD (chronic obstructive pulmonary disease) ([place])    "associated w/lung sarcoidosis"  . 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 DISH (diffuse idiopathic skeletal hyperostosis) 12/02/2015  . Diverticulitis   . GENERALIZED OSTEOARTHROSIS UNSPECIFIED SITE 05/28/2010  . GERD 05/28/2010  . Heart murmur   . History of gout   . History of kidney stones   . HYPERLIPIDEMIA 05/28/2010  . Hypertension   . IgM lambda paraproteinemia   . 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 Malignant neoplasm of descending colon ([place]) 05/28/2010  . MITRAL VALVE PROLAPSE 05/28/2010  . 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 NSTEMI (non-ST elevated myocardial infarction) ([place]) 06/03/11  . 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  . OSA on CPAP 05/28/2010  . PALPITATIONS, CHRONIC 05/28/2010  . Parotid gland  pain 2010   infection  . Peripheral neuropathy    "tx'd w/targeted chemo" (05/12/2016)  . Pneumonia 2-3 times  . Presence of permanent cardiac pacemaker   . 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 Sarcoidosis    pulmonalis  . UNSPECIFIED INFLAMMATORY AND TOXIC NEUROPATHY 05/28/2010  . Vision loss   . VITAMIN D DEFICIENCY 05/28/2010    reports that he has never smoked. He has never used smokeless tobacco. He  reports current alcohol use. He reports that he does not use drugs.   Current Outpatient Medications:  .  albuterol (VENTOLIN HFA) 108 (90 Base) MCG/ACT inhaler, Inhale 2 puffs into the lungs every 6 (six) hours as needed for wheezing or shortness of breath., Disp: 8 g, Rfl: 2 .  amLODipine (NORVASC) 5 MG tablet, Take 5 mg by mouth daily., Disp: , Rfl:  .  aspirin EC 81 MG tablet, Take 1 tablet (81 mg total) by mouth daily., Disp: 150 tablet, Rfl: 2 .  bimatoprost (LUMIGAN) 0.01 % SOLN, Place 1 drop into both eyes at bedtime., Disp: , Rfl:  .  Cholecalciferol 25 MCG (1000 UT) tablet, Take 1,000 Units by mouth daily. , Disp: , Rfl:  .  dorzolamide-timolol (COSOPT) 22.3-6.8 MG/ML ophthalmic solution, Place 1 drop into both eyes 2 (two) times daily. , Disp: , Rfl:  .  ELIQUIS 5 MG TABS tablet, Take 1 tablet by mouth twice daily, Disp: 180 tablet, Rfl: 2 .  finasteride (PROSCAR) 5 MG tablet, Take 5 mg by mouth daily. , Disp: , Rfl:  .  fluticasone (FLONASE) 50 MCG/ACT nasal spray, Place 2 sprays into both nostrils 2 (two) times daily., Disp: , Rfl:  .  Fluticasone Propionate, Inhal, (FLOVENT DISKUS) 100 MCG/BLIST AEPB, Inhale 1 Inhaler into the lungs 2 (two) times daily., Disp: 60 each, Rfl: 2 .  MULTAQ 400 MG tablet, TAKE 1 TABLET BY MOUTH TWICE DAILY WITH MEALS, Disp: 180 tablet, Rfl: 3 .  Multiple Vitamin (MULTIVITAMIN WITH MINERALS) TABS tablet, Take 1 tablet by mouth daily., Disp: , Rfl:  .  pantoprazole (PROTONIX) 40 MG tablet, Take 1 tablet by mouth once daily, Disp: 90 tablet, Rfl: 2 .  Polyethyl Glycol-Propyl Glycol (SYSTANE OP), Place 1 drop into both eyes daily as needed (dry eyes)., Disp: , Rfl:  .  rosuvastatin (CRESTOR) 10 MG tablet, Take 1 tablet (10 mg total) by mouth daily., Disp: 90 tablet, Rfl: 3 .  tadalafil (CIALIS) 5 MG tablet, Take 5 mg by mouth at bedtime. , Disp: , Rfl:  .  triamterene-hydrochlorothiazide (MAXZIDE-25) 37.5-25 MG tablet, Take 1 tablet by mouth once daily, Disp:  90 tablet, Rfl: 3   Observations/Objective: Blood pressure 108/60, pulse 73, temperature 97.7 F (36.5 C), height 5' 7.5" (1.715 m), weight 186 lb (84.4 kg), SpO2 98 %.  Physical Exam Constitutional:      Appearance: He is well-developed.  HENT:     Head: Normocephalic.     Right Ear: Hearing normal.     Left Ear: Hearing normal.     Nose: Nose normal.  Neck:     Thyroid: No thyroid mass or thyromegaly.     Vascular: No carotid bruit.     Trachea: Trachea normal.  Cardiovascular:     Rate and Rhythm: Normal rate and regular rhythm.     Pulses: Normal pulses.     Heart sounds: Heart sounds not distant. Murmur present. Systolic murmur present with a grade of 2/6. No friction rub. No gallop.      Comments:  No peripheral edema Pulmonary:     Effort: Pulmonary effort is normal. No respiratory distress.     Breath sounds: Normal breath sounds.  Skin:    General: Skin is warm and dry.     Findings: No rash.  Psychiatric:        Speech: Speech normal.        Behavior: Behavior normal.        Thought Content: Thought content normal.      Assessment and Plan Pre-op evaluation  Risk factors for complications from surgery: OSA, CAD, age, afib on anticoagulants Cleared by cardiology Oropharynx clear and open.  No pulmonary concerns.  Eval A1C given history of slightly elevated glucose.  UA clear except chronic hematuria from eliquis, neg eval. Has chronic anemia.[name] will eval.. followed by Heme.. not clearly contraindication to surgery.  Paroxysmal atrial fibrillation ([name])  ON ASA and ELiquis.. hold 3 days prior to surgery.    [name], MD

## 2020-02-01 NOTE — Progress Notes (Signed)
   Covid-19 Vaccination Clinic  Name:  Derrick [name]    MRN: [id number] DOB: July 07, 1941  02/01/2020  Derrick [name] was observed post Covid-19 immunization for 15 minutes without incidence. He was provided with Vaccine Information Sheet and instruction to access the V-Safe system.   Derrick [name] was instructed to call 911 with any severe reactions post vaccine: [name] Difficulty breathing  . Swelling of your face and throat  . A fast heartbeat  . A bad rash all over your body  . Dizziness and weakness    Immunizations Administered    Name Date Dose VIS Date Route   Pfizer COVID-19 Vaccine 02/01/2020  9:33 AM 0.3 mL 12/07/2019 Intramuscular   Manufacturer: [place]   Lot: [id number]   [place]: [id number]

## 2020-02-01 NOTE — Assessment & Plan Note (Signed)
ON ASA and ELiquis.. hold 3 days prior to surgery.

## 2020-02-02 LAB — CBC WITH DIFFERENTIAL/PLATELET
Absolute Monocytes: 611 cells/uL (ref 200–950)
Basophils Absolute: 39 cells/uL (ref 0–200)
Basophils Relative: 0.7 %
Eosinophils Absolute: 138 cells/uL (ref 15–500)
Eosinophils Relative: 2.5 %
HCT: 31.8 % — ABNORMAL LOW (ref 38.5–50.0)
Hemoglobin: 9.9 g/dL — ABNORMAL LOW (ref 13.2–17.1)
Lymphs Abs: 1221 cells/uL (ref 850–3900)
MCH: 24 pg — ABNORMAL LOW (ref 27.0–33.0)
MCHC: 31.1 g/dL — ABNORMAL LOW (ref 32.0–36.0)
MCV: 77 fL — ABNORMAL LOW (ref 80.0–100.0)
MPV: 10.8 fL (ref 7.5–12.5)
Monocytes Relative: 11.1 %
Neutro Abs: 3493 cells/uL (ref 1500–7800)
Neutrophils Relative %: 63.5 %
Platelets: 211 10*3/uL (ref 140–400)
RBC: 4.13 10*6/uL — ABNORMAL LOW (ref 4.20–5.80)
RDW: 14.3 % (ref 11.0–15.0)
Total Lymphocyte: 22.2 %
WBC: 5.5 10*3/uL (ref 3.8–10.8)

## 2020-02-02 LAB — COMPREHENSIVE METABOLIC PANEL
AG Ratio: 1.8 (calc) (ref 1.0–2.5)
ALT: 10 U/L (ref 9–46)
AST: 20 U/L (ref 10–35)
Albumin: 4.4 g/dL (ref 3.6–5.1)
Alkaline phosphatase (APISO): 43 U/L (ref 35–144)
BUN: 19 mg/dL (ref 7–25)
CO2: 27 mmol/L (ref 20–32)
Calcium: 10.3 mg/dL (ref 8.6–10.3)
Chloride: 103 mmol/L (ref 98–110)
Creat: 1.03 mg/dL (ref 0.70–1.18)
Globulin: 2.5 g/dL (calc) (ref 1.9–3.7)
Glucose, Bld: 77 mg/dL (ref 65–99)
Potassium: 4.3 mmol/L (ref 3.5–5.3)
Sodium: 139 mmol/L (ref 135–146)
Total Bilirubin: 0.5 mg/dL (ref 0.2–1.2)
Total Protein: 6.9 g/dL (ref 6.1–8.1)

## 2020-02-02 LAB — HEMOGLOBIN A1C
Hgb A1c MFr Bld: 5 % of total Hgb (ref <5.7)
Mean Plasma Glucose: 97 (calc)
eAG (mmol/L): 5.4 (calc)

## 2020-02-05 DIAGNOSIS — M25561 Pain in right knee: Secondary | ICD-10-CM | POA: Diagnosis not present

## 2020-02-08 ENCOUNTER — Telehealth: Payer: Self-pay

## 2020-02-08 ENCOUNTER — Other Ambulatory Visit: Payer: Self-pay | Admitting: Hematology and Oncology

## 2020-02-08 DIAGNOSIS — D539 Nutritional anemia, unspecified: Secondary | ICD-10-CM | POA: Insufficient documentation

## 2020-02-08 DIAGNOSIS — D508 Other iron deficiency anemias: Secondary | ICD-10-CM

## 2020-02-08 DIAGNOSIS — C186 Malignant neoplasm of descending colon: Secondary | ICD-10-CM

## 2020-02-08 DIAGNOSIS — D509 Iron deficiency anemia, unspecified: Secondary | ICD-10-CM | POA: Insufficient documentation

## 2020-02-08 NOTE — Telephone Encounter (Signed)
Called and given below message. He verbalize understanding and is agreeable to appts next week.

## 2020-02-08 NOTE — Telephone Encounter (Signed)
-----   Message from Heath Lark, MD sent at 02/08/2020  9:56 AM EST ----- Regarding: call pt His PCP noted he has abnormal labs I would like to get him to get labs checked early in the week and then see me plus IV iron on Friday next week If that works for him, let me know and I will put scheduling msg

## 2020-02-08 NOTE — Telephone Encounter (Signed)
I sent scheduling msg 

## 2020-02-10 ENCOUNTER — Encounter: Payer: Self-pay | Admitting: Hematology and Oncology

## 2020-02-11 ENCOUNTER — Telehealth: Payer: Self-pay

## 2020-02-11 ENCOUNTER — Other Ambulatory Visit: Payer: Self-pay

## 2020-02-11 ENCOUNTER — Inpatient Hospital Stay: Payer: Medicare Other | Attending: Hematology and Oncology

## 2020-02-11 ENCOUNTER — Telehealth: Payer: Self-pay | Admitting: Hematology and Oncology

## 2020-02-11 DIAGNOSIS — D500 Iron deficiency anemia secondary to blood loss (chronic): Secondary | ICD-10-CM | POA: Diagnosis not present

## 2020-02-11 DIAGNOSIS — D539 Nutritional anemia, unspecified: Secondary | ICD-10-CM

## 2020-02-11 DIAGNOSIS — C186 Malignant neoplasm of descending colon: Secondary | ICD-10-CM

## 2020-02-11 LAB — CBC WITH DIFFERENTIAL/PLATELET
Abs Immature Granulocytes: 0.01 10*3/uL (ref 0.00–0.07)
Basophils Absolute: 0.1 10*3/uL (ref 0.0–0.1)
Basophils Relative: 1 %
Eosinophils Absolute: 0.1 10*3/uL (ref 0.0–0.5)
Eosinophils Relative: 3 %
HCT: 33.2 % — ABNORMAL LOW (ref 39.0–52.0)
Hemoglobin: 10.2 g/dL — ABNORMAL LOW (ref 13.0–17.0)
Immature Granulocytes: 0 %
Lymphocytes Relative: 20 %
Lymphs Abs: 1 10*3/uL (ref 0.7–4.0)
MCH: 23.6 pg — ABNORMAL LOW (ref 26.0–34.0)
MCHC: 30.7 g/dL (ref 30.0–36.0)
MCV: 76.9 fL — ABNORMAL LOW (ref 80.0–100.0)
Monocytes Absolute: 0.5 10*3/uL (ref 0.1–1.0)
Monocytes Relative: 11 %
Neutro Abs: 3.2 10*3/uL (ref 1.7–7.7)
Neutrophils Relative %: 65 %
Platelets: 218 10*3/uL (ref 150–400)
RBC: 4.32 MIL/uL (ref 4.22–5.81)
RDW: 15.5 % (ref 11.5–15.5)
WBC: 4.8 10*3/uL (ref 4.0–10.5)
nRBC: 0 % (ref 0.0–0.2)

## 2020-02-11 LAB — VITAMIN B12: Vitamin B-12: 484 pg/mL (ref 180–914)

## 2020-02-11 LAB — CEA (IN HOUSE-CHCC): CEA (CHCC-In House): 1.47 ng/mL (ref 0.00–5.00)

## 2020-02-11 LAB — IRON AND TIBC
Iron: 23 ug/dL — ABNORMAL LOW (ref 42–163)
Saturation Ratios: 4 % — ABNORMAL LOW (ref 20–55)
TIBC: 538 ug/dL — ABNORMAL HIGH (ref 202–409)
UIBC: 514 ug/dL — ABNORMAL HIGH (ref 117–376)

## 2020-02-11 LAB — FERRITIN: Ferritin: <4 ng/mL — ABNORMAL LOW (ref 24–336)

## 2020-02-11 NOTE — Telephone Encounter (Signed)
Scheduled per 2/12 sch msg. Called and spoke with pt, confirmed 2/19 and 2/26 appts

## 2020-02-11 NOTE — Telephone Encounter (Signed)
Called and scheduled lab appt for today at 2 pm. He verbalized understanding.

## 2020-02-14 ENCOUNTER — Telehealth: Payer: Self-pay | Admitting: Hematology and Oncology

## 2020-02-14 NOTE — Telephone Encounter (Signed)
Contacted patient to verify virtual visit for pre reg °

## 2020-02-15 ENCOUNTER — Other Ambulatory Visit: Payer: Self-pay

## 2020-02-15 ENCOUNTER — Inpatient Hospital Stay (HOSPITAL_BASED_OUTPATIENT_CLINIC_OR_DEPARTMENT_OTHER): Payer: Medicare Other | Admitting: Hematology and Oncology

## 2020-02-15 ENCOUNTER — Inpatient Hospital Stay: Payer: Medicare Other

## 2020-02-15 VITALS — BP 124/68 | HR 59 | Temp 98.5°F | Resp 18

## 2020-02-15 DIAGNOSIS — D508 Other iron deficiency anemias: Secondary | ICD-10-CM

## 2020-02-15 DIAGNOSIS — D472 Monoclonal gammopathy: Secondary | ICD-10-CM | POA: Diagnosis not present

## 2020-02-15 DIAGNOSIS — D500 Iron deficiency anemia secondary to blood loss (chronic): Secondary | ICD-10-CM | POA: Diagnosis not present

## 2020-02-15 DIAGNOSIS — D539 Nutritional anemia, unspecified: Secondary | ICD-10-CM

## 2020-02-15 DIAGNOSIS — C186 Malignant neoplasm of descending colon: Secondary | ICD-10-CM

## 2020-02-15 MED ORDER — SODIUM CHLORIDE 0.9 % IV SOLN
Freq: Once | INTRAVENOUS | Status: AC
  Filled 2020-02-15: qty 250

## 2020-02-15 MED ORDER — SODIUM CHLORIDE 0.9 % IV SOLN
510.0000 mg | Freq: Once | INTRAVENOUS | Status: AC
  Administered 2020-02-15: 510 mg via INTRAVENOUS
  Filled 2020-02-15: qty 510

## 2020-02-15 NOTE — Patient Instructions (Signed)

## 2020-02-15 NOTE — Assessment & Plan Note (Addendum)
He has no GI symptoms Tumor marker, CEA is normal His last colonoscopy was reviewed which show benign polyps His next colonoscopy is due in September of this year However, if he have recurrent, persistent iron deficiency anemia, we will get his colonoscopy done sooner than later

## 2020-02-15 NOTE — Assessment & Plan Note (Signed)
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

## 2020-02-15 NOTE — Progress Notes (Signed)
HEMATOLOGY-ONCOLOGY ELECTRONIC VISIT PROGRESS NOTE  Patient Care Team: [name], MD as PCP - General (Family Medicine) [name], MD as PCP - Electrophysiology (Cardiology) [name], MD as PCP - Cardiology (Cardiology) Heath Lark, MD as Consulting Physician (Hematology and Oncology)  I connected with by [facility] video conference and verified that I am speaking with the correct person  Due to technology failure, a telephone visit was used today to discuss plan of care and management I discussed the limitations, risks, security and privacy concerns of performing an evaluation and management service by [facility] and the availability of in person appointments.  I also discussed with the patient that there may be a patient responsible charge related to this service. The patient expressed understanding and agreed to proceed.   ASSESSMENT & PLAN:  Malignant neoplasm of descending colon [name] has no GI symptoms Tumor marker, CEA is normal His last colonoscopy was reviewed which show benign polyps His next colonoscopy is due in September of this year However, if he have recurrent, persistent iron deficiency anemia, we will get his colonoscopy done sooner than later  Iron deficiency anemia The most likely cause of his anemia is due to chronic blood loss/malabsorption syndrome. We discussed some of the risks, benefits, and alternatives of intravenous iron infusions. The patient is symptomatic from anemia and the iron level is critically low. He tolerated oral iron supplement poorly and desires to achieved higher levels of iron faster for adequate hematopoesis. Some of the side-effects to be expected including risks of infusion reactions, phlebitis, headaches, nausea and fatigue.  The patient is willing to proceed. Patient education material was dispensed.  Goal is to keep ferritin level greater than 50 and resolution of anemia The most likely cause is due to dual therapy with  antiplatelet agent as well as anticoagulation therapy I plan to recheck his blood count and iron studies again next month on March 12 We will call him with test results  MGUS (monoclonal gammopathy of unknown significance) I reassured him that the MGUS has nothing to do with his iron deficiency anemia His last myeloma panel was stable   No orders of the defined types were placed in this encounter.   INTERVAL HISTORY: Please see below for problem oriented charting. The electronic visit today was performed to review recent test results and to discuss the plan for treatment of severe iron deficiency anemia, in the setting of future planned right knee surgery He was seen by me in September 2020 for follow-up on MGUS He has severe right knee surgery and was scheduled for knee replacement surgery on March 11, 2020 2 weeks ago, his primary care doctor perform surgical clearance and blood work and was noted to have microscopic anemia He was referred here for further evaluation and management The patient takes aspirin as well as Eliquis for his cardiac issues He walks at least 2 miles on a regular basis but did complain of some sensation of shortness of breath on moderate exertion and fatigue The patient denies any recent signs or symptoms of bleeding such as spontaneous epistaxis, hematuria or hematochezia. He has occasional rare nosebleed that spontaneously improved He denies new back pain.  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  He denies recent falls He had recent tooth problem and was placed on amoxicillin for 1 week by his dentist The patient eats a variety of diet He denies pica He could not tolerate oral iron supplement  SUMMARY  OF ONCOLOGIC HISTORY:  Derrick [name] was transferred to my care after his prior physician has left.  I reviewed the patient's records extensive and collaborated the history with the patient. 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 He was initially evaluated and treated by a hematologist and a neurologist in [place] before he moved to [place]. 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 He had bone marrow aspirate and biopsy which showed no evidence of malignancy in his bone marrow. His neurologic condition is characterized primarily by painful distal neuropathy, feet worse than hands, and lower extremity weakness. At one point he could barely walk a block without having to stop due to pain and weakness. He was given an initial trial of Rituxan in 2010 with no significant improvement. However following reinitiation of Rituxan in April 2012 he did get a progressive improvement. He was put on a maintenance program which we have continued in this office on an every other month basis through 03/23/2013.  The patient have new onset atrial fibrillation. His echocardiogram shows significant dilated atrium without evidence of increase ventricular wall thickness. 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 Repeat bone marrow aspirate and biopsy on 08/04/2018 was negative for malignancy He had CT imaging study of the abdomen and pelvis in 2019 and recent CT scan of the chest in May 2020 which show no evidence of lymphoma His last colonoscopy in 2018 was within normal limits.  Although multiple polyps were found, pathology from the colon biopsies were negative.  REVIEW OF SYSTEMS:   Constitutional: Denies fevers, chills or abnormal weight loss Eyes: Denies blurriness of vision Ears, nose, mouth, throat, and face: Denies mucositis or sore throat Cardiovascular: Denies palpitation, chest discomfort Gastrointestinal:  Denies nausea, heartburn or change in bowel habits Skin: Denies abnormal skin  rashes Lymphatics: Denies new lymphadenopathy or easy bruising Neurological:Denies numbness, tingling or new weaknesses Behavioral/Psych: Mood is stable, no new changes  Extremities: No lower extremity edema All other systems were reviewed with the patient and are negative.  I have reviewed the past medical history, past surgical history, social history and family history with the patient and they are unchanged from previous note.  ALLERGIES:  has No Known Allergies.  MEDICATIONS:  Current Outpatient Medications  Medication Sig Dispense Refill  . amoxicillin (AMOXIL) 500 MG capsule Take 500 mg by mouth 3 (three) times daily.    [name] albuterol (VENTOLIN HFA) 108 (90 Base) MCG/ACT inhaler Inhale 2 puffs into the lungs every 6 (six) hours as needed for wheezing or shortness of breath. 8 g 2  . amLODipine (NORVASC) 5 MG tablet Take 5 mg by mouth daily.    [name] aspirin EC 81 MG tablet Take 1 tablet (81 mg total) by mouth daily. 150 tablet 2  . bimatoprost (LUMIGAN) 0.01 % SOLN Place 1 drop into both eyes at bedtime.    . Cholecalciferol 25 MCG (1000 UT) tablet Take 1,000 Units by mouth daily.     . dorzolamide-timolol (COSOPT) 22.3-6.8 MG/ML ophthalmic solution Place 1 drop into both eyes 2 (two) times daily.     [name] ELIQUIS 5 MG TABS tablet Take 1 tablet by mouth twice daily 180 tablet 2  . finasteride (PROSCAR) 5 MG tablet Take 5 mg by mouth daily.     . fluticasone (FLONASE) 50  MCG/ACT nasal spray Place 2 sprays into both nostrils 2 (two) times daily.    . Fluticasone Propionate, Inhal, (FLOVENT DISKUS) 100 MCG/BLIST AEPB Inhale 1 Inhaler into the lungs 2 (two) times daily. 60 each 2  . MULTAQ 400 MG tablet TAKE 1 TABLET BY MOUTH TWICE DAILY WITH MEALS 180 tablet 3  . Multiple Vitamin (MULTIVITAMIN WITH MINERALS) TABS tablet Take 1 tablet by mouth daily.    . pantoprazole (PROTONIX) 40 MG tablet Take 1 tablet by mouth once daily 90 tablet 2  . Polyethyl Glycol-Propyl Glycol (SYSTANE OP) Place 1  drop into both eyes daily as needed (dry eyes).    . rosuvastatin (CRESTOR) 10 MG tablet Take 1 tablet (10 mg total) by mouth daily. 90 tablet 3  . tadalafil (CIALIS) 5 MG tablet Take 5 mg by mouth at bedtime.     . triamterene-hydrochlorothiazide (MAXZIDE-25) 37.5-25 MG tablet Take 1 tablet by mouth once daily 90 tablet 3   No current facility-administered medications for this visit.    PHYSICAL EXAMINATION: ECOG PERFORMANCE STATUS: 1 - Symptomatic but completely ambulatory  LABORATORY DATA:  I have reviewed the data as listed CMP Latest Ref Rng & Units 02/01/2020 01/22/2020 09/19/2019  Glucose 65 - 99 mg/dL 77 - -  BUN 7 - 25 mg/dL 19 - -  Creatinine 0.70 - 1.18 mg/dL 1.03 - -  Sodium 135 - 146 mmol/L 139 - -  Potassium 3.5 - 5.3 mmol/L 4.3 - -  Chloride 98 - 110 mmol/L 103 - -  CO2 20 - 32 mmol/L 27 - -  Calcium 8.6 - 10.3 mg/dL 10.3 - -  Total Protein 6.1 - 8.1 g/dL 6.9 6.7 6.6  Total Bilirubin 0.2 - 1.2 mg/dL 0.5 0.4 0.5  Alkaline Phos 39 - 117 IU/L - 53 62  AST 10 - 35 U/L 20 19 18   ALT 9 - 46 U/L 10 11 14     Lab Results  Component Value Date   WBC 4.8 02/11/2020   HGB 10.2 (L) 02/11/2020   HCT 33.2 (L) 02/11/2020   MCV 76.9 (L) 02/11/2020   PLT 218 02/11/2020   NEUTROABS 3.2 02/11/2020    I discussed the assessment and treatment plan with the patient. The patient was provided an opportunity to ask questions and all were answered. The patient agreed with the plan and demonstrated an understanding of the instructions. The patient was advised to call back or seek an in-person evaluation if the symptoms worsen or if the condition fails to improve as anticipated.    I spent 20 minutes for the appointment reviewing test results, discuss management and coordination of care.  Heath Lark, MD 02/15/2020 10:14 AM

## 2020-02-15 NOTE — Assessment & Plan Note (Signed)
The most likely cause of his anemia is due to chronic blood loss/malabsorption syndrome. We discussed some of the risks, benefits, and alternatives of intravenous iron infusions. The patient is symptomatic from anemia and the iron level is critically low. He tolerated oral iron supplement poorly and desires to achieved higher levels of iron faster for adequate hematopoesis. Some of the side-effects to be expected including risks of infusion reactions, phlebitis, headaches, nausea and fatigue.  The patient is willing to proceed. Patient education material was dispensed.  Goal is to keep ferritin level greater than 50 and resolution of anemia The most likely cause is due to dual therapy with antiplatelet agent as well as anticoagulation therapy I plan to recheck his blood count and iron studies again next month on March 12 We will call him with test results

## 2020-02-18 ENCOUNTER — Telehealth: Payer: Self-pay | Admitting: Hematology and Oncology

## 2020-02-18 ENCOUNTER — Encounter: Payer: Self-pay | Admitting: Hematology and Oncology

## 2020-02-18 NOTE — Telephone Encounter (Signed)
Scheduled 3/12 lab per 2/19 sch msg. Left voicemail with appt details and mailed a reminder letter/[place].

## 2020-02-18 NOTE — Telephone Encounter (Signed)
Pt requested to change the time for his labs on 3/12. Pt is aware of new time.

## 2020-02-22 ENCOUNTER — Inpatient Hospital Stay: Payer: Medicare Other

## 2020-02-22 ENCOUNTER — Other Ambulatory Visit: Payer: Self-pay

## 2020-02-22 ENCOUNTER — Ambulatory Visit: Payer: Medicare Other

## 2020-02-22 VITALS — BP 141/65 | HR 61 | Temp 98.6°F | Resp 18

## 2020-02-22 DIAGNOSIS — D508 Other iron deficiency anemias: Secondary | ICD-10-CM

## 2020-02-22 DIAGNOSIS — M1711 Unilateral primary osteoarthritis, right knee: Secondary | ICD-10-CM | POA: Diagnosis not present

## 2020-02-22 DIAGNOSIS — D500 Iron deficiency anemia secondary to blood loss (chronic): Secondary | ICD-10-CM | POA: Diagnosis not present

## 2020-02-22 DIAGNOSIS — D539 Nutritional anemia, unspecified: Secondary | ICD-10-CM

## 2020-02-22 MED ORDER — SODIUM CHLORIDE 0.9 % IV SOLN
510.0000 mg | Freq: Once | INTRAVENOUS | Status: AC
  Administered 2020-02-22: 510 mg via INTRAVENOUS
  Filled 2020-02-22: qty 510

## 2020-02-22 MED ORDER — SODIUM CHLORIDE 0.9 % IV SOLN
Freq: Once | INTRAVENOUS | Status: AC
  Filled 2020-02-22: qty 250

## 2020-02-22 NOTE — H&P (Signed)
TOTAL KNEE ADMISSION H&P  Patient is being admitted for right total knee arthroplasty.  Subjective:  Chief Complaint:   Right knee primary OA / pain  HPI: Derrick [name], 79 y.o. male, has a history of pain and functional disability in the right knee due to arthritis and has failed non-surgical conservative treatments for greater than 12 weeks to include NSAID's and/or analgesics, corticosteriod injections and activity modification.  Onset of symptoms was gradual, starting 15 years ago with gradually worsening course since that time. The patient noted prior procedures on the knee to include  arthroscopy on the right knee(s).  Patient currently rates pain in the right knee(s) at 10 out of 10 with activity. Patient has night pain, worsening of pain with activity and weight bearing, pain that interferes with activities of daily living, pain with passive range of motion, crepitus and joint swelling.  Patient has evidence of periarticular osteophytes and joint space narrowing by imaging studies.  There is no active infection.  Risks, benefits and expectations were discussed with the patient.  Risks including but not limited to the risk of anesthesia, blood clots, nerve damage, blood vessel damage, failure of the prosthesis, infection and up to and including death.  Patient understand the risks, benefits and expectations and wishes to proceed with surgery.   PCP: [name], MD  D/C Plans:       Home   Post-op Meds:       No Rx given   Tranexamic Acid:      To be given - IV   Decadron:      Is to be given  FYI:     Eliquis and ASA  Norco  CPAP  DME:   Pt equipment arranged  PT:   OPPT @ [facility]  Pharmacy: [place], [place]  Patient Active Problem List   Diagnosis Date Noted  . Deficiency anemia 02/08/2020  . Iron deficiency anemia 02/08/2020  . Pre-op evaluation 02/01/2020  . S/P CABG x 3 06/05/2019  . Inguinal hernia 02/21/2019  . Chronic fatigue 08/16/2018   . Allergic response, initial encounter 06/01/2018  . 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 MCI (mild cognitive impairment) 01/26/2018  . Pacemaker ECG pattern 11/30/2017  . Bradycardia, sinus 11/30/2017  . Ventricular tachycardia (paroxysmal) ([place]) 11/30/2017  . Monocytosis 06/10/2017  . S/P placement of cardiac pacemaker 08/03/2016  . Paroxysmal atrial fibrillation ([place]) 05/12/2016  . Bradycardia 02/09/2016  . DISH (diffuse idiopathic skeletal hyperostosis) 12/02/2015  . Coronary artery disease involving coronary bypass graft of native heart without angina pectoris 03/26/2015  . 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 OSA on CPAP 10/16/2014  . 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 Essential hypertension, benign 05/07/2014  . Routine health maintenance 12/16/2013  . Neuropathy associated with MGUS ([place]) 08/08/2013  . CIDP (chronic inflammatory demyelinating polyneuropathy) ([place]) 04/11/2012  . Allergic rhinitis 02/21/2012  . Coronary artery disease involving native coronary artery of native heart with angina pectoris ([place]) 06/22/2011  . Malignant basal cell neoplasm of skin 06/16/2011  . 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 History of colon cancer 05/28/2010  . Malignant neoplasm of descending colon ([place]) 05/28/2010  . Vitamin D deficiency 05/28/2010  . Hyperlipidemia with target LDL less than 100 05/28/2010  . GERD 05/28/2010  . BPH associated with nocturia 05/28/2010  . Osteoarthritis of both hands 05/28/2010  Past Medical History:  Diagnosis Date  . Anemia   . BENIGN PROSTATIC HYPERTROPHY, WITH OBSTRUCTION 05/28/2010  . CAD (coronary artery disease)    a. s/p NSTEMI 06/01/11: DES to RCA;  b. cath 06/25/11:   dLM 50-60% (FFR 0.87), prox to mid LAD 40-50%, D1 50%, pCFX 50%, RCA stent ok, dPDA 80%, EF 55-60%.  His FFR  was felt to be negative and therefore medical therapy was recommended ;  echo 6/12: EF 55-60%, mild AS   . Chronic back pain   . CIDP (chronic inflammatory demyelinating polyneuropathy) ([place]) 04/11/2012  . Colon cancer ([place]) dx'd 2000   "left"  . COLONIC POLYPS, ADENOMATOUS, HX OF 05/28/2010  . Complication of anesthesia    "stopped breathing; related to my sleep apnea" & urinary retention   . COPD (chronic obstructive pulmonary disease) ([place])    "associated w/lung sarcoidosis"  . 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 DISH (diffuse idiopathic skeletal hyperostosis) 12/02/2015  . Diverticulitis   . GENERALIZED OSTEOARTHROSIS UNSPECIFIED SITE 05/28/2010  . GERD 05/28/2010  . Heart murmur   . History of gout   . History of kidney stones   . HYPERLIPIDEMIA 05/28/2010  . Hypertension   . IgM lambda paraproteinemia   . 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 Malignant neoplasm of descending colon ([place]) 05/28/2010  . MITRAL VALVE PROLAPSE 05/28/2010  . 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 NSTEMI (non-ST elevated myocardial infarction) ([place]) 06/03/11  . 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  . OSA on CPAP 05/28/2010  . PALPITATIONS, CHRONIC 05/28/2010  . Parotid gland pain 2010   infection  . Peripheral neuropathy    "tx'd w/targeted chemo" (05/12/2016)  . Pneumonia 2-3 times  . Presence of permanent cardiac pacemaker   . 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 Sarcoidosis    pulmonalis  . UNSPECIFIED INFLAMMATORY AND TOXIC NEUROPATHY 05/28/2010  . Vision loss   . VITAMIN D DEFICIENCY 05/28/2010    Past Surgical History:  Procedure Laterality Date  . APPENDECTOMY  1954  . CATARACT EXTRACTION W/ INTRAOCULAR LENS IMPLANT Left   . COLON SURGERY  2000   decending colon   . CORONARY ANGIOPLASTY WITH STENT PLACEMENT  06/03/11  . CORONARY ARTERY BYPASS GRAFT N/A 06/05/2019   Procedure: CORONARY ARTERY BYPASS GRAFTING (CABG) TIMES THREE USING LEFT  INTERNAL MAMMARY ARTERY AND RIGHT GREATER SEPHANOUS VEIN;  Surgeon: [name], MD;  Location: [facility];  Service: Open Heart Surgery;  Laterality: N/A;  . ELECTROPHYSIOLOGIC STUDY N/A 05/12/2016   Procedure: Cardioversion;  Surgeon: [name], MD;  Location: [facility];  Service: Cardiovascular;  Laterality: N/A;  . EP IMPLANTABLE DEVICE N/A 05/12/2016   Procedure: Pacemaker Implant;  Surgeon: [name], MD;  Location: [facility];  Service: Cardiovascular;  Laterality: N/A;  . EYE SURGERY    . INGUINAL HERNIA REPAIR Left 02/21/2019   Procedure: LEFT INGUINAL HERNIA REPAIR WITH MESH;  Surgeon: [name], MD;  Location: [facility];  Service: General;  Laterality: Left;  . INSERT / REPLACE / REMOVE PACEMAKER  05/12/2016  . INTRAVASCULAR PRESSURE WIRE/FFR STUDY N/A 05/15/2019   Procedure: INTRAVASCULAR PRESSURE WIRE/FFR STUDY;  Surgeon: [name], MD;  Location: [facility];  Service: Cardiovascular;  Laterality: N/A;  . KNEE ARTHROSCOPY Right 2003  . LEFT HEART CATH AND CORONARY ANGIOGRAPHY N/A 05/15/2019   Procedure: LEFT HEART CATH AND CORONARY ANGIOGRAPHY;  Surgeon: [name], MD;  Location: [facility];  Service: Cardiovascular;  Laterality: N/A;  . LEFT HEART CATHETERIZATION WITH CORONARY ANGIOGRAM N/A 03/31/2015   Procedure: LEFT HEART CATHETERIZATION WITH CORONARY ANGIOGRAM;  Surgeon: [name], MD;  Location: [facility];  Service: Cardiovascular;  Laterality: N/A;  . LUNG SURGERY  2001   "open lung dissection"  . MOHS SURGERY Left ~ 2008   ear  . PROSTATE BIOPSY  2010  . TEE WITHOUT CARDIOVERSION N/A 06/05/2019   Procedure: TRANSESOPHAGEAL ECHOCARDIOGRAM (TEE);  Surgeon: [name], [name], MD;  Location: [facility];  Service: Open Heart Surgery;  Laterality: N/A;  . TONSILLECTOMY  1950    No current facility-administered medications for this encounter.   Current Outpatient Medications  Medication Sig Dispense Refill Last Dose   . albuterol (VENTOLIN HFA) 108 (90 Base) MCG/ACT inhaler Inhale 2 puffs into the lungs every 6 (six) hours as needed for wheezing or shortness of breath. 8 g 2   . amLODipine (NORVASC) 5 MG tablet Take 5 mg by mouth daily.     [name] amoxicillin (AMOXIL) 500 MG capsule Take 500 mg by mouth 3 (three) times daily.     [name] aspirin EC 81 MG tablet Take 1 tablet (81 mg total) by mouth daily. 150 tablet 2   . bimatoprost (LUMIGAN) 0.01 % SOLN Place 1 drop into both eyes at bedtime.     . Cholecalciferol 25 MCG (1000 UT) tablet Take 1,000 Units by mouth daily.      . dorzolamide-timolol (COSOPT) 22.3-6.8 MG/ML ophthalmic solution Place 1 drop into both eyes 2 (two) times daily.      [name] ELIQUIS 5 MG TABS tablet Take 1 tablet by mouth twice daily 180 tablet 2   . finasteride (PROSCAR) 5 MG tablet Take 5 mg by mouth daily.      . fluticasone (FLONASE) 50 MCG/ACT nasal spray Place 2 sprays into both nostrils 2 (two) times daily.     . Fluticasone Propionate, Inhal, (FLOVENT DISKUS) 100 MCG/BLIST AEPB Inhale 1 Inhaler into the lungs 2 (two) times daily. 60 each 2   . MULTAQ 400 MG tablet TAKE 1 TABLET BY MOUTH TWICE DAILY WITH MEALS 180 tablet 3   . Multiple Vitamin (MULTIVITAMIN WITH MINERALS) TABS tablet Take 1 tablet by mouth daily.     . pantoprazole (PROTONIX) 40 MG tablet Take 1 tablet by mouth once daily 90 tablet 2   . Polyethyl Glycol-Propyl Glycol (SYSTANE OP) Place 1 drop into both eyes daily as needed (dry eyes).     . rosuvastatin (CRESTOR) 10 MG tablet Take 1 tablet (10 mg total) by mouth daily. 90 tablet 3   . tadalafil (CIALIS) 5 MG tablet Take 5 mg by mouth at bedtime.      . triamterene-hydrochlorothiazide (MAXZIDE-25) 37.5-25 MG tablet Take 1 tablet by mouth once daily 90 tablet 3    No Known Allergies   Social History   Tobacco Use  . Smoking status: Never Smoker  . Smokeless tobacco: Never Used  Substance Use Topics  . Alcohol use: Yes    Comment: occasional    Family History   Problem Relation Age of Onset  . Arthritis Mother        severe  . Coronary artery disease Mother   . Colon polyps Mother   . Heart disease Mother   . Coronary artery disease Brother   . Cancer Brother        choriocarcinoma  . Arrhythmia Brother 56       Afib/Tachycardia  .  Heart attack Brother   . Congestive Heart Failure Brother      Review of Systems  Constitutional: Negative.   HENT: Negative.   Eyes: Negative.   Respiratory: Negative.   Cardiovascular: Negative.   Gastrointestinal: Negative.   Genitourinary: Positive for frequency.  Musculoskeletal: Positive for joint pain.  Skin: Negative.   Neurological: Negative.   Endo/Heme/Allergies: Negative.   Psychiatric/Behavioral: Negative.      Objective:  Physical Exam  Constitutional: He is oriented to person, place, and time. He appears well-developed.  HENT:  Head: Normocephalic.  Eyes: Pupils are equal, round, and reactive to light.  Neck: No JVD present. No tracheal deviation present. No thyromegaly present.  Cardiovascular: Normal rate, regular rhythm and intact distal pulses.  Pacemaker  Respiratory: Effort normal and breath sounds normal. No respiratory distress. He has no wheezes.  GI: Soft. There is no abdominal tenderness. There is no guarding.  Musculoskeletal:     Cervical back: Neck supple.     Right knee: Swelling and bony tenderness present. No deformity, erythema, ecchymosis or lacerations. Decreased range of motion. Tenderness present.  Lymphadenopathy:    He has no cervical adenopathy.  Neurological: He is alert and oriented to person, place, and time. A sensory deficit (bilateral LE neuropathy) is present.  Skin: Skin is warm and dry.  Psychiatric: He has a normal mood and affect.     Labs:  Estimated body mass index is 28.7 kg/m as calculated from the following:   Height as of 02/01/20: 5' 7.5" (1.715 m).   Weight as of 02/01/20: 84.4 kg.   Imaging Review Plain radiographs demonstrate  severe degenerative joint disease of the right knee.  The bone quality appears to be good for age and reported activity level.      Assessment/Plan:  End stage arthritis, right knee   The patient history, physical examination, clinical judgment of the provider and imaging studies are consistent with end stage degenerative joint disease of the right knee(s) and total knee arthroplasty is deemed medically necessary. The treatment options including medical management, injection therapy arthroscopy and arthroplasty were discussed at length. The risks and benefits of total knee arthroplasty were presented and reviewed. The risks due to aseptic loosening, infection, stiffness, patella tracking problems, thromboembolic complications and other imponderables were discussed. The patient acknowledged the explanation, agreed to proceed with the plan and consent was signed. Patient is being admitted for inpatient treatment for surgery, pain control, PT, OT, prophylactic antibiotics, VTE prophylaxis, progressive ambulation and ADL's and discharge planning. The patient is planning to be discharged home.    [place] Royce Sciara   PA-C  02/22/2020, 11:41 AM

## 2020-02-22 NOTE — Patient Instructions (Signed)

## 2020-02-25 ENCOUNTER — Telehealth: Payer: Self-pay | Admitting: Family Medicine

## 2020-02-25 NOTE — Chronic Care Management (AMB) (Signed)
  Chronic Care Management   Note  02/25/2020 Name: Derrick [name] MRN: [id number] DOB: [birth year]  Derrick [name] is a 79 y.o. year old male who is a primary care patient of Bedsole, Amy E, MD. I reached out to Derrick [name] by phone today in response to a referral sent by Derrick [name]'s PCP, [name], MD. [name] wife, [name] gave verbal consent for CCM.  Derrick [name] was given information about Chronic Care Management services today including:  1. CCM service includes personalized support from designated clinical staff supervised by his physician, including individualized plan of care and coordination with other care providers 2. 24/7 contact phone numbers for assistance for urgent and routine care needs. 3. Service will only be billed when office clinical staff spend 20 minutes or more in a month to coordinate care. 4. Only one practitioner may furnish and bill the service in a calendar month. 5. The patient may stop CCM services at any time (effective at the end of the month) by phone call to the office staff. 6. The patient will be responsible for cost sharing (co-pay) of up to 20% of the service fee (after annual deductible is met).  Patient agreed to services and verbal consent obtained.   Follow up plan:   Raynicia Dukes [employer] Scheduler

## 2020-02-29 DIAGNOSIS — H401122 Primary open-angle glaucoma, left eye, moderate stage: Secondary | ICD-10-CM | POA: Diagnosis not present

## 2020-02-29 DIAGNOSIS — H401112 Primary open-angle glaucoma, right eye, moderate stage: Secondary | ICD-10-CM | POA: Diagnosis not present

## 2020-03-03 NOTE — Patient Instructions (Addendum)
DUE TO COVID-19 ONLY ONE VISITOR IS ALLOWED TO COME WITH YOU AND STAY IN THE WAITING ROOM ONLY DURING PRE OP AND PROCEDURE DAY OF SURGERY. THE 1 VISITOR MAY VISIT WITH YOU AFTER SURGERY IN YOUR PRIVATE ROOM DURING VISITING HOURS ONLY!  YOU NEED TO HAVE A COVID 19 TEST ON__Friday 03/12/2021_____ @__9 :55 am_____, THIS TEST MUST BE DONE BEFORE SURGERY, COME  [address] [place] , [id number].  ([place]) ONCE YOUR COVID TEST IS COMPLETED, PLEASE BEGIN THE QUARANTINE INSTRUCTIONS AS OUTLINED IN YOUR HANDOUT.                [place]     Your procedure is scheduled on: Tuesday 03/11/2020   Report to [facility] Main  Entrance    Report to Short Stay at  0530 AM               Please bring CPAP mask , tubing and machine (has it set variable so adjusts itself)!    Call this number if you have problems the morning of surgery [phone]    Remember: Do not eat food  :After Midnight.     NO SOLID FOOD AFTER MIDNIGHT THE NIGHT PRIOR TO SURGERY  And   NOTHING BY MOUTH EXCEPT CLEAR LIQUIDS UNTIL  0415 am .     PLEASE FINISH ENSURE DRINK PER SURGEON ORDER  WHICH NEEDS TO BE COMPLETED AT  0415 am .   CLEAR LIQUID DIET   Foods Allowed                                                                     Foods Excluded  Coffee and tea, regular and decaf                             liquids that you cannot  Plain Jell-O any favor except red or purple                                           see through such as: Fruit ices (not with fruit pulp)                                     milk, soups, orange juice  Iced Popsicles                                    All solid food Carbonated beverages, regular and diet                                    Cranberry, grape and apple juices Sports drinks like Gatorade Lightly seasoned clear broth or consume(fat free) Sugar, honey syrup  Sample Menu Breakfast  Lunch                                      Supper Cranberry juice                    Beef broth                            Chicken broth Jell-O                                     Grape juice                           Apple juice Coffee or tea                        Jell-O                                      Popsicle                                                Coffee or tea                        Coffee or tea  _____________________________________________________________________    BRUSH YOUR TEETH MORNING OF SURGERY AND RINSE YOUR MOUTH OUT, NO CHEWING GUM CANDY OR MINTS.     Take these medicines the morning of surgery with A SIP OF WATER: Multaq, Amlodipine (Norvasc), use eye drops, Pantoprazole (Protonix), Finesteride (Proscar), use Flonase nasal spray, use Albuterol inhaler and use Flovent  inhaler if needed and bring inhalers with you to the hospital                                 You may not have any metal on your body including hair pins and              piercings  Do not wear jewelry, make-up, lotions, powders or perfumes, deodorant                          Men may shave face and neck.   Do not bring valuables to the hospital. [place].  Contacts, dentures or bridgework may not be worn into surgery.  Leave suitcase in the car. After surgery it may be brought to your room.     Patients discharged the day of surgery will not be allowed to drive home. IF YOU ARE HAVING SURGERY AND GOING HOME THE SAME DAY, YOU MUST HAVE AN ADULT TO DRIVE YOU HOME AND  BE WITH YOU FOR 24 HOURS. YOU MAY GO HOME BY TAXI OR UBER OR ORTHERWISE, BUT AN ADULT MUST ACCOMPANY YOU HOME AND STAY WITH YOU FOR 24 HOURS.  Name and phone number  of your driver: spouse- [name]  [phone]                Please read over the following fact sheets you were given: _____________________________________________________________________             [facility] - Preparing for  Surgery Before surgery, you can play an important role.  Because skin is not sterile, your skin needs to be as free of germs as possible.  You can reduce the number of germs on your skin by washing with CHG (chlorahexidine gluconate) soap before surgery.  CHG is an antiseptic cleaner which kills germs and bonds with the skin to continue killing germs even after washing. Please DO NOT use if you have an allergy to CHG or antibacterial soaps.  If your skin becomes reddened/irritated stop using the CHG and inform your nurse when you arrive at Short Stay. Do not shave (including legs and underarms) for at least 48 hours prior to the first CHG shower.  You may shave your face/neck. Please follow these instructions carefully:  1.  Shower with CHG Soap the night before surgery and the  morning of Surgery.  2.  If you choose to wash your hair, wash your hair first as usual with your  normal  shampoo.  3.  After you shampoo, rinse your hair and body thoroughly to remove the  shampoo.                           4.  Use CHG as you would any other liquid soap.  You can apply chg directly  to the skin and wash                       Gently with a scrungie or clean washcloth.  5.  Apply the CHG Soap to your body ONLY FROM THE NECK DOWN.   Do not use on face/ open                           Wound or open sores. Avoid contact with eyes, ears mouth and genitals (private parts).                       Wash face,  Genitals (private parts) with your normal soap.             6.  Wash thoroughly, paying special attention to the area where your surgery  will be performed.  7.  Thoroughly rinse your body with warm water from the neck down.  8.  DO NOT shower/wash with your normal soap after using and rinsing off  the CHG Soap.                9.  Pat yourself dry with a clean towel.            10.  Wear clean pajamas.            11.  Place clean sheets on your bed the night of your first shower and do not  sleep with pets. Day  of Surgery : Do not apply any lotions/deodorants the morning of surgery.  Please wear clean clothes to the hospital/surgery center.  FAILURE TO FOLLOW THESE INSTRUCTIONS MAY RESULT IN THE CANCELLATION OF YOUR SURGERY PATIENT SIGNATURE_________________________________  NURSE SIGNATURE__________________________________  ________________________________________________________________________   Adam Phenix  An incentive spirometer is a tool that  can help keep your lungs clear and active. This tool measures how well you are filling your lungs with each breath. Taking long deep breaths may help reverse or decrease the chance of developing breathing (pulmonary) problems (especially infection) following:  A long period of time when you are unable to move or be active. BEFORE THE PROCEDURE   If the spirometer includes an indicator to show your best effort, your nurse or respiratory therapist will set it to a desired goal.  If possible, sit up straight or lean slightly forward. Try not to slouch.  Hold the incentive spirometer in an upright position. INSTRUCTIONS FOR USE  1. Sit on the edge of your bed if possible, or sit up as far as you can in bed or on a chair. 2. Hold the incentive spirometer in an upright position. 3. Breathe out normally. 4. Place the mouthpiece in your mouth and seal your lips tightly around it. 5. Breathe in slowly and as deeply as possible, raising the piston or the ball toward the top of the column. 6. Hold your breath for 3-5 seconds or for as long as possible. Allow the piston or ball to fall to the bottom of the column. 7. Remove the mouthpiece from your mouth and breathe out normally. 8. Rest for a few seconds and repeat Steps 1 through 7 at least 10 times every 1-2 hours when you are awake. Take your time and take a few normal breaths between deep breaths. 9. The spirometer may include an indicator to show your best effort. Use the indicator as a goal  to work toward during each repetition. 10. After each set of 10 deep breaths, practice coughing to be sure your lungs are clear. If you have an incision (the cut made at the time of surgery), support your incision when coughing by placing a pillow or rolled up towels firmly against it. Once you are able to get out of bed, walk around indoors and cough well. You may stop using the incentive spirometer when instructed by your caregiver.  RISKS AND COMPLICATIONS  Take your time so you do not get dizzy or light-headed.  If you are in pain, you may need to take or ask for pain medication before doing incentive spirometry. It is harder to take a deep breath if you are having pain. AFTER USE  Rest and breathe slowly and easily.  It can be helpful to keep track of a log of your progress. Your caregiver can provide you with a simple table to help with this. If you are using the spirometer at home, follow these instructions: [place] IF:   You are having difficultly using the spirometer.  You have trouble using the spirometer as often as instructed.  Your pain medication is not giving enough relief while using the spirometer.  You develop fever of 100.5 F (38.1 C) or higher. SEEK IMMEDIATE MEDICAL CARE IF:   You cough up bloody sputum that had not been present before.  You develop fever of 102 F (38.9 C) or greater.  You develop worsening pain at or near the incision site. MAKE SURE YOU:   Understand these instructions.  Will watch your condition.  Will get help right away if you are not doing well or get worse. Document Released: 04/25/2007 Document Revised: 03/06/2012 Document Reviewed: 06/26/2007 ExitCare Patient Information 2014 Exit[employer], [place].   ________________________________________________________________________  WHAT IS A BLOOD TRANSFUSION? Blood Transfusion Information  A transfusion is the replacement of blood or  some of its parts. Blood is made up of  multiple cells which provide different functions.  Red blood cells carry oxygen and are used for blood loss replacement.  White blood cells fight against infection.  Platelets control bleeding.  Plasma helps clot blood.  Other blood products are available for specialized needs, such as hemophilia or other clotting disorders. BEFORE THE TRANSFUSION  Who gives blood for transfusions?   Healthy volunteers who are fully evaluated to make sure their blood is safe. This is blood bank blood. Transfusion therapy is the safest it has ever been in the practice of medicine. Before blood is taken from a donor, a complete history is taken to make sure that person has no history of diseases nor engages in risky social behavior (examples are intravenous drug use or sexual activity with multiple partners). The donor's travel history is screened to minimize risk of transmitting infections, such as malaria. The donated blood is tested for signs of infectious diseases, such as HIV and hepatitis. The blood is then tested to be sure it is compatible with you in order to minimize the chance of a transfusion reaction. If you or a relative donates blood, this is often done in anticipation of surgery and is not appropriate for emergency situations. It takes many days to process the donated blood. RISKS AND COMPLICATIONS Although transfusion therapy is very safe and saves many lives, the main dangers of transfusion include:   Getting an infectious disease.  Developing a transfusion reaction. This is an allergic reaction to something in the blood you were given. Every precaution is taken to prevent this. The decision to have a blood transfusion has been considered carefully by your caregiver before blood is given. Blood is not given unless the benefits outweigh the risks. AFTER THE TRANSFUSION  Right after receiving a blood transfusion, you will usually feel much better and more energetic. This is especially true if  your red blood cells have gotten low (anemic). The transfusion raises the level of the red blood cells which carry oxygen, and this usually causes an energy increase.  The nurse administering the transfusion will monitor you carefully for complications. HOME CARE INSTRUCTIONS  No special instructions are needed after a transfusion. You may find your energy is better. Speak with your caregiver about any limitations on activity for underlying diseases you may have. SEEK MEDICAL CARE IF:   Your condition is not improving after your transfusion.  You develop redness or irritation at the intravenous (IV) site. SEEK IMMEDIATE MEDICAL CARE IF:  Any of the following symptoms occur over the next 12 hours:  Shaking chills.  You have a temperature by mouth above 102 F (38.9 C), not controlled by medicine.  Chest, back, or muscle pain.  People around you feel you are not acting correctly or are confused.  Shortness of breath or difficulty breathing.  Dizziness and fainting.  You get a rash or develop hives.  You have a decrease in urine output.  Your urine turns a dark color or changes to pink, red, or brown. Any of the following symptoms occur over the next 10 days:  You have a temperature by mouth above 102 F (38.9 C), not controlled by medicine.  Shortness of breath.  Weakness after normal activity.  The white part of the eye turns yellow (jaundice).  You have a decrease in the amount of urine or are urinating less often.  Your urine turns a dark color or changes to pink, red, or brown.  Document Released: 12/10/2000 Document Revised: 03/06/2012 Document Reviewed: 07/29/2008 [facility] Patient Information 2014 [place], [place].  _______________________________________________________________________

## 2020-03-04 ENCOUNTER — Other Ambulatory Visit: Payer: Self-pay

## 2020-03-04 ENCOUNTER — Encounter (HOSPITAL_COMMUNITY)
Admission: RE | Admit: 2020-03-04 | Discharge: 2020-03-04 | Disposition: A | Discharge status: Home / Self Care | Payer: Medicare Other | Source: Ambulatory Visit | Attending: Orthopedic Surgery | Admitting: Orthopedic Surgery

## 2020-03-04 ENCOUNTER — Encounter (HOSPITAL_COMMUNITY): Payer: Self-pay

## 2020-03-04 DIAGNOSIS — K219 Gastro-esophageal reflux disease without esophagitis: Secondary | ICD-10-CM | POA: Diagnosis not present

## 2020-03-04 DIAGNOSIS — Z79899 Other long term (current) drug therapy: Secondary | ICD-10-CM | POA: Insufficient documentation

## 2020-03-04 DIAGNOSIS — D509 Iron deficiency anemia, unspecified: Secondary | ICD-10-CM | POA: Insufficient documentation

## 2020-03-04 DIAGNOSIS — Z01812 Encounter for preprocedural laboratory examination: Secondary | ICD-10-CM | POA: Diagnosis not present

## 2020-03-04 DIAGNOSIS — I2510 Atherosclerotic heart disease of native coronary artery without angina pectoris: Secondary | ICD-10-CM | POA: Diagnosis not present

## 2020-03-04 DIAGNOSIS — Z01818 Encounter for other preprocedural examination: Secondary | ICD-10-CM | POA: Insufficient documentation

## 2020-03-04 DIAGNOSIS — G4733 Obstructive sleep apnea (adult) (pediatric): Secondary | ICD-10-CM | POA: Insufficient documentation

## 2020-03-04 DIAGNOSIS — J449 Chronic obstructive pulmonary disease, unspecified: Secondary | ICD-10-CM | POA: Diagnosis not present

## 2020-03-04 DIAGNOSIS — Z7901 Long term (current) use of anticoagulants: Secondary | ICD-10-CM | POA: Insufficient documentation

## 2020-03-04 DIAGNOSIS — Z950 Presence of cardiac pacemaker: Secondary | ICD-10-CM | POA: Diagnosis not present

## 2020-03-04 DIAGNOSIS — R011 Cardiac murmur, unspecified: Secondary | ICD-10-CM | POA: Diagnosis not present

## 2020-03-04 DIAGNOSIS — E785 Hyperlipidemia, unspecified: Secondary | ICD-10-CM | POA: Insufficient documentation

## 2020-03-04 DIAGNOSIS — M1711 Unilateral primary osteoarthritis, right knee: Secondary | ICD-10-CM | POA: Insufficient documentation

## 2020-03-04 DIAGNOSIS — I10 Essential (primary) hypertension: Secondary | ICD-10-CM | POA: Insufficient documentation

## 2020-03-04 DIAGNOSIS — I252 Old myocardial infarction: Secondary | ICD-10-CM | POA: Insufficient documentation

## 2020-03-04 LAB — BASIC METABOLIC PANEL
Anion gap: 10 (ref 5–15)
BUN: 18 mg/dL (ref 8–23)
CO2: 25 mmol/L (ref 22–32)
Calcium: 9.9 mg/dL (ref 8.9–10.3)
Chloride: 103 mmol/L (ref 98–111)
Creatinine, Ser: 0.99 mg/dL (ref 0.61–1.24)
GFR calc Af Amer: >60 mL/min (ref >60)
GFR calc non Af Amer: >60 mL/min (ref >60)
Glucose, Bld: 85 mg/dL (ref 70–99)
Potassium: 3.6 mmol/L (ref 3.5–5.1)
Sodium: 138 mmol/L (ref 135–145)

## 2020-03-04 LAB — CBC
HCT: 36.2 % — ABNORMAL LOW (ref 39.0–52.0)
Hemoglobin: 11.1 g/dL — ABNORMAL LOW (ref 13.0–17.0)
MCH: 26.3 pg (ref 26.0–34.0)
MCHC: 30.7 g/dL (ref 30.0–36.0)
MCV: 85.8 fL (ref 80.0–100.0)
Platelets: 185 10*3/uL (ref 150–400)
RBC: 4.22 MIL/uL (ref 4.22–5.81)
RDW: 22.6 % — ABNORMAL HIGH (ref 11.5–15.5)
WBC: 4.1 10*3/uL (ref 4.0–10.5)
nRBC: 0 % (ref 0.0–0.2)

## 2020-03-04 LAB — SURGICAL PCR SCREEN
MRSA, PCR: NEGATIVE
Staphylococcus aureus: NEGATIVE

## 2020-03-04 NOTE — Progress Notes (Addendum)
PCP - Dr. Daleen Squibb  [place] 02/01/2020 pre-op evlauation  epic Neurology- Dr. Brett Fairy  (for OSA)  LOV 11/05/2019 epic Oncology- Dr. Alvy Bimler  Telemedicine visit 02/15/2020 and infusion, infusion( for iron deficiency anemia) on 02/22/2020  epic Cardiologist - Dr. [name]  LOV-10/03/2019 with Richardson Dopp, PA, cardiac clearance from [place], [place] from 01/29/2019 epic EP physician- Dr. Allegra Lai Last Device check-12/24/2019 epic EP study and Pacemaker insertion -05/12/2016  epic  Chest x-ray - 07/05/2019 epic EKG - 09/18/2019 epic Stress Test - 05/29/2018  epic ECHO -07/26/2019- ECHO intraoperative TEE 06/05/2019 epic Tee wo cardioversion, 05/22/2019-ECHO epic Cardiac Cath - 05/15/2019 Left heart cath epic, 03/31/2015- Left heart cath epic, 06/03/2011- coronary angioplasty with stent  epic  Sleep Study - 01/28/2017 epic CPAP - yes  Fasting Blood Sugar -n/a  Checks Blood Sugar _0____ times a day  Blood Thinner Instructions:Eliquis . Instructed to Stop Eliquis 3 days prior to surgery. Patient's last dose will be Friday 03/07/2020. Aspirin Instructions:Aspirin . Instructed to stop one week prior to surgery by Dr. [name]. Last Dose:03/03/2020  Anesthesia review:   Chart to be reviewed by Konrad Felix, PA.  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 Patient was also instructed that they will need to review over the PAT instructions again at home before surgery.

## 2020-03-05 ENCOUNTER — Encounter: Payer: Self-pay | Admitting: Hematology and Oncology

## 2020-03-05 LAB — ABO/RH: ABO/RH(D): O POS

## 2020-03-06 ENCOUNTER — Other Ambulatory Visit: Payer: Self-pay | Admitting: Hematology and Oncology

## 2020-03-06 ENCOUNTER — Telehealth: Payer: Self-pay

## 2020-03-06 DIAGNOSIS — D508 Other iron deficiency anemias: Secondary | ICD-10-CM

## 2020-03-06 NOTE — Progress Notes (Signed)
Anesthesia Chart Review   Case: [id number] Date/Time: 03/11/20 0700   Procedure: TOTAL KNEE ARTHROPLASTY (Right Knee) - 70 mins   Anesthesia type: Spinal   Pre-op diagnosis: Right knee osteoarthritis   Location: [facility] ROOM 09 / [facility]   Surgeons: [name], MD      DISCUSSION:79 y.o. never smoker with h/o GERD, HLD, Pulmonary sarcoidosis, HTN, COPD, OSA on CPAP, CIDP, CAD, pacemaker in place (last device check 12/24/2019, device orders requested), colon cancer, iron deficiency anemia, right knee OA scheduled for above procedure 03/11/20 with Dr. [name].   Seen by hem/onc preoperatively for evaluation of iron deficiency anemia, IV iron infusion. Hemoglobin has increased from 9.9 on 02/01/20 to 11.1 on 03/04/20.   Pt seen by PCP 02/01/20 for preoperative evaluation.  Per OV note, "Risk factors for complications from surgery: OSA, CAD, age, afib on anticoagulants Cleared by cardiology Oropharynx clear and open.  No pulmonary concerns.  Eval A1C given history of slightly elevated glucose.  UA clear except chronic hematuria from eliquis, neg eval. Has chronic anemia.[name] will eval.. followed by Heme.. not clearly contraindication to surgery."  Cleared by cardiology 01/30/20.  Per Sande Rives, PA-C, "Chart reviewed as part of pre-operative protocol coverage. I called and spoke with patient today and he is doing well from a cardiac standpoint. No cardiac symptoms and able to complete >4.0 METS. Given past medical history and time since last visit, based on ACC/AHA guidelines, Oluwafemi Loury would be at acceptable risk for the planned procedure without further cardiovascular testing.  Per Pharmacy, "Ok to hold Eliquis for 3 days prior to procedure." This should be restarted as soon as able following procedure. Also OK to hold Aspirin if needed but this should also be restarted as soon as able following procedure."  Last dose of Eliquis 03/07/20 per pt.  Anticipate pt can proceed with planned  procedure barring acute status change.   VS: BP (!) 143/80   Pulse 77   Temp 37.3 C (Oral)   Resp 16   Ht 5\' 9"  (1.753 m)   Wt 85.3 kg   SpO2 100%   BMI 27.76 kg/m   PROVIDERS: [name], MD is PCP   Heath Lark, MD is Oncologist last seen 02/15/20  [name], MD is Cardiologist   LABS: Labs reviewed: Acceptable for surgery. (all labs ordered are listed, but only abnormal results are displayed)  Labs Reviewed  CBC - Abnormal; Notable for the following components:      Result Value   Hemoglobin 11.1 (*)    HCT 36.2 (*)    RDW 22.6 (*)    All other components within normal limits  SURGICAL PCR SCREEN  BASIC METABOLIC PANEL  TYPE AND SCREEN  ABO/RH     IMAGES:   EKG: 09/18/2019 Rate 79 bpm AV dual-paced rhythm   CV: Echo 05/22/2019 IMPRESSIONS    1. The left ventricle has normal systolic function, with an ejection  fraction of 55-60%. The cavity size was normal. Left ventricular diastolic  Doppler parameters are consistent with pseudonormalization. Indeterminate  filling pressures.  2. Apical hypokinesis.  3. The right ventricle has normal systolic function. The cavity was  normal. There is no increase in right ventricular wall thickness.  4. Left atrial size was severely dilated.  5. There is mild mitral annular calcification present. No evidence of  mitral valve stenosis.  6. The aortic valve is tricuspid. Moderate thickening of the aortic  valve. Moderate calcification of the aortic valve.  Mild stenosis of the  aortic valve.  7. There is mild to moderate dilatation of the ascending aorta measuring  43 mm.   Cardiac Cath 05/15/2019  Previously placed Prox RCA to Mid RCA stent (unknown type) is widely patent.  Dist RCA lesion is 50% stenosed.  Mid LM to Ost LAD lesion is 70% stenosed.  Prox LAD lesion is 60% stenosed.   1. Severe distal left main stenosis, hemodynamically significant by DFR analysis 2. Moderate proximal LAD  stenosis 3. Continued patency of the RCA stents 4. Nonobstructive LCx stenosis 5. Mild aortic stenosis with peak-to-peak gradient < 10 mmHg by pullback  Recommend: cardiac surgical consultation for consideration of CABG in this patient with symptomatic left main disease. Last echo 09/2018 should be updated to reevaluate severity of aortic stenosis  Myocardial Perfusion 05/29/2018  Nuclear stress EF: 51%.  This is a low risk study.  The left ventricular ejection fraction is mildly decreased (45-54%).   1. EF 51% with apical hypokinesis.  2. Medium-sized, moderate intensity basal to apical inferior and inferolateral perfusion defect.  This looks like prior infarction with no ischemia, though wall motion is normal in the inferior and lateral walls. .   Overall, I think this is a low risk study with no evidence for ischemia.  Past Medical History:  Diagnosis Date  . Anemia    iron deficiency anemia- infusion on 02/15/2020 and 02/22/2020  . BENIGN PROSTATIC HYPERTROPHY, WITH OBSTRUCTION 05/28/2010  . CAD (coronary artery disease)    a. s/p NSTEMI 06/01/11: DES to RCA;  b. cath 06/25/11:   dLM 50-60% (FFR 0.87), prox to mid LAD 40-50%, D1 50%, pCFX 50%, RCA stent ok, dPDA 80%, EF 55-60%.  His FFR was felt to be negative and therefore medical therapy was recommended ;  echo 6/12: EF 55-60%, mild AS   . Chronic back pain   . CIDP (chronic inflammatory demyelinating polyneuropathy) ([place]) 04/11/2012  . Colon cancer ([place]) dx'd 2000   "left"  . COLONIC POLYPS, ADENOMATOUS, HX OF 05/28/2010  . Complication of anesthesia    "stopped breathing; related to my sleep apnea" & urinary retention   . COPD (chronic obstructive pulmonary disease) ([place])    "associated w/lung sarcoidosis"  . 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 DISH (diffuse idiopathic skeletal hyperostosis) 12/02/2015  . Diverticulitis   . GENERALIZED OSTEOARTHROSIS UNSPECIFIED SITE 05/28/2010  . GERD 05/28/2010  . Heart murmur   . History  of gout   . History of kidney stones   . HYPERLIPIDEMIA 05/28/2010  . Hypertension   . IgM lambda paraproteinemia   . 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 Malignant neoplasm of descending colon ([place]) 05/28/2010  . MITRAL VALVE PROLAPSE 05/28/2010  . 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 NSTEMI (non-ST elevated myocardial infarction) ([place]) 06/03/11  . 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  . OSA on CPAP 05/28/2010  . PALPITATIONS, CHRONIC 05/28/2010  . Parotid gland pain 2010   infection  . Peripheral neuropathy    "tx'd w/targeted chemo" (05/12/2016)  . Pneumonia 2-3 times  . Presence of permanent cardiac pacemaker    [employer]  . 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 Sarcoidosis    pulmonalis  . UNSPECIFIED INFLAMMATORY AND TOXIC NEUROPATHY 05/28/2010  . Vision loss   . VITAMIN D DEFICIENCY 05/28/2010    Past Surgical History:  Procedure Laterality Date  . APPENDECTOMY  1954   age 21  . CATARACT EXTRACTION W/ INTRAOCULAR LENS IMPLANT Left   .  COLON SURGERY  2000   decending colon   . CORONARY ANGIOPLASTY WITH STENT PLACEMENT  06/03/11  . CORONARY ARTERY BYPASS GRAFT N/A 06/05/2019   Procedure: CORONARY ARTERY BYPASS GRAFTING (CABG) TIMES THREE USING LEFT INTERNAL MAMMARY ARTERY AND RIGHT GREATER SEPHANOUS VEIN;  Surgeon: [name], MD;  Location: [facility];  Service: Open Heart Surgery;  Laterality: N/A;  . ELECTROPHYSIOLOGIC STUDY N/A 05/12/2016   Procedure: Cardioversion;  Surgeon: [name], MD;  Location: [facility];  Service: Cardiovascular;  Laterality: N/A;  . EP IMPLANTABLE DEVICE N/A 05/12/2016   Procedure: Pacemaker Implant;  Surgeon: [name], MD;  Location: [facility];  Service: Cardiovascular;  Laterality: N/A;  . EYE SURGERY     bilateral cataract with lens implant  . HERNIA REPAIR  01/2019   left inguinal hernia repair  . INGUINAL HERNIA REPAIR Left 02/21/2019   Procedure:  LEFT INGUINAL HERNIA REPAIR WITH MESH;  Surgeon: [name], MD;  Location: [facility];  Service: General;  Laterality: Left;  . INSERT / REPLACE / REMOVE PACEMAKER  05/12/2016  . INTRAVASCULAR PRESSURE WIRE/FFR STUDY N/A 05/15/2019   Procedure: INTRAVASCULAR PRESSURE WIRE/FFR STUDY;  Surgeon: [name], MD;  Location: [facility];  Service: Cardiovascular;  Laterality: N/A;  . KNEE ARTHROSCOPY Right 2003  . LEFT HEART CATH AND CORONARY ANGIOGRAPHY N/A 05/15/2019   Procedure: LEFT HEART CATH AND CORONARY ANGIOGRAPHY;  Surgeon: [name], MD;  Location: [facility];  Service: Cardiovascular;  Laterality: N/A;  . LEFT HEART CATHETERIZATION WITH CORONARY ANGIOGRAM N/A 03/31/2015   Procedure: LEFT HEART CATHETERIZATION WITH CORONARY ANGIOGRAM;  Surgeon: [name], MD;  Location: [facility] C[facility];  Service: Cardiovascular;  Laterality: N/A;  . LUNG SURGERY  2001   "open lung dissection"  . MOHS SURGERY Left ~ 2008   ear  . PROSTATE BIOPSY  2010  . TEE WITHOUT CARDIOVERSION N/A 06/05/2019   Procedure: TRANSESOPHAGEAL ECHOCARDIOGRAM (TEE);  Surgeon: [name], [name], MD;  Location: [facility];  Service: Open Heart Surgery;  Laterality: N/A;  . TONSILLECTOMY  1950    MEDICATIONS: . albuterol (VENTOLIN HFA) 108 (90 Base) MCG/ACT inhaler  . amLODipine (NORVASC) 5 MG tablet  . aspirin EC 81 MG tablet  . bimatoprost (LUMIGAN) 0.01 % SOLN  . Cholecalciferol 25 MCG (1000 UT) tablet  . dorzolamide-timolol (COSOPT) 22.3-6.8 MG/ML ophthalmic solution  . ELIQUIS 5 MG TABS tablet  . finasteride (PROSCAR) 5 MG tablet  . fluticasone (FLONASE) 50 MCG/ACT nasal spray  . Fluticasone Propionate, Inhal, (FLOVENT DISKUS) 100 MCG/BLIST AEPB  . MULTAQ 400 MG tablet  . Multiple Vitamin (MULTIVITAMIN WITH MINERALS) TABS tablet  . pantoprazole (PROTONIX) 40 MG tablet  . Polyethyl Glycol-Propyl Glycol (SYSTANE OP)  . rosuvastatin (CRESTOR) 10 MG tablet  . tadalafil (CIALIS) 5 MG tablet  .  triamterene-hydrochlorothiazide (MAXZIDE-25) 37.5-25 MG tablet   No current facility-administered medications for this encounter.     Maia Plan [facility] Pre-Surgical Testing [phone] 03/06/20  4:23 PM

## 2020-03-06 NOTE — Telephone Encounter (Signed)
Called and given below message. He verbalized understanding. Will keep appt as scheduled for tomorrow.

## 2020-03-06 NOTE — Telephone Encounter (Signed)
-----   Message from Heath Lark, MD sent at 03/06/2020  8:03 AM EST ----- Regarding: call him and ask if he wants labs and iron drawn today

## 2020-03-07 ENCOUNTER — Inpatient Hospital Stay: Payer: Medicare Other

## 2020-03-07 ENCOUNTER — Telehealth: Payer: Self-pay

## 2020-03-07 ENCOUNTER — Other Ambulatory Visit: Payer: Self-pay

## 2020-03-07 ENCOUNTER — Other Ambulatory Visit (HOSPITAL_COMMUNITY)
Admission: RE | Admit: 2020-03-07 | Discharge: 2020-03-07 | Disposition: A | Discharge status: Home / Self Care | Payer: Medicare Other | Source: Ambulatory Visit | Attending: Orthopedic Surgery | Admitting: Orthopedic Surgery

## 2020-03-07 ENCOUNTER — Inpatient Hospital Stay: Payer: Medicare Other | Attending: Hematology and Oncology

## 2020-03-07 DIAGNOSIS — Z20822 Contact with and (suspected) exposure to covid-19: Secondary | ICD-10-CM | POA: Insufficient documentation

## 2020-03-07 DIAGNOSIS — G6289 Other specified polyneuropathies: Secondary | ICD-10-CM | POA: Insufficient documentation

## 2020-03-07 DIAGNOSIS — D509 Iron deficiency anemia, unspecified: Secondary | ICD-10-CM | POA: Diagnosis not present

## 2020-03-07 DIAGNOSIS — D508 Other iron deficiency anemias: Secondary | ICD-10-CM

## 2020-03-07 DIAGNOSIS — Z01812 Encounter for preprocedural laboratory examination: Secondary | ICD-10-CM | POA: Diagnosis not present

## 2020-03-07 DIAGNOSIS — D472 Monoclonal gammopathy: Secondary | ICD-10-CM | POA: Insufficient documentation

## 2020-03-07 DIAGNOSIS — G6181 Chronic inflammatory demyelinating polyneuritis: Secondary | ICD-10-CM | POA: Diagnosis not present

## 2020-03-07 LAB — CBC WITH DIFFERENTIAL/PLATELET
Abs Immature Granulocytes: 0.01 10*3/uL (ref 0.00–0.07)
Basophils Absolute: 0 10*3/uL (ref 0.0–0.1)
Basophils Relative: 1 %
Eosinophils Absolute: 0.1 10*3/uL (ref 0.0–0.5)
Eosinophils Relative: 4 %
HCT: 35.7 % — ABNORMAL LOW (ref 39.0–52.0)
Hemoglobin: 11.1 g/dL — ABNORMAL LOW (ref 13.0–17.0)
Immature Granulocytes: 0 %
Lymphocytes Relative: 23 %
Lymphs Abs: 0.8 10*3/uL (ref 0.7–4.0)
MCH: 26.9 pg (ref 26.0–34.0)
MCHC: 31.1 g/dL (ref 30.0–36.0)
MCV: 86.4 fL (ref 80.0–100.0)
Monocytes Absolute: 0.4 10*3/uL (ref 0.1–1.0)
Monocytes Relative: 11 %
Neutro Abs: 2.1 10*3/uL (ref 1.7–7.7)
Neutrophils Relative %: 61 %
Platelets: 178 10*3/uL (ref 150–400)
RBC: 4.13 MIL/uL — ABNORMAL LOW (ref 4.22–5.81)
RDW: 23.4 % — ABNORMAL HIGH (ref 11.5–15.5)
WBC: 3.5 10*3/uL — ABNORMAL LOW (ref 4.0–10.5)
nRBC: 0 % (ref 0.0–0.2)

## 2020-03-07 LAB — IRON AND TIBC
Iron: 70 ug/dL (ref 42–163)
Saturation Ratios: 18 % — ABNORMAL LOW (ref 20–55)
TIBC: 394 ug/dL (ref 202–409)
UIBC: 324 ug/dL (ref 117–376)

## 2020-03-07 LAB — SARS CORONAVIRUS 2 (TAT 6-24 HRS): SARS Coronavirus 2: NEGATIVE

## 2020-03-07 LAB — FERRITIN: Ferritin: 185 ng/mL (ref 24–336)

## 2020-03-07 NOTE — Telephone Encounter (Signed)
Called and given below message. He verbalized understanding. He wanted thank Dr. Alvy Bimler for everything.

## 2020-03-07 NOTE — Telephone Encounter (Signed)
-----   Message from Heath Lark, MD sent at 03/07/2020  9:30 AM EST ----- Regarding: iron studies His iron saturation is a bit low but ferritin is now over 150 Based on this result, he is not iron deficient and he does not need to talk iron pill If his surgeon has a question Please ask him to call He will not have a normal hemoglobin before surgery if that is what they are looking for

## 2020-03-10 ENCOUNTER — Encounter (HOSPITAL_COMMUNITY): Payer: Self-pay | Admitting: Orthopedic Surgery

## 2020-03-10 NOTE — Anesthesia Preprocedure Evaluation (Addendum)
Anesthesia Evaluation  Patient identified by MRN, date of birth, ID band Patient awake    Reviewed: Allergy & Precautions, NPO status , Patient's Chart, lab work & pertinent test results  History of Anesthesia Complications (+) history of anesthetic complications  Airway Mallampati: II  TM Distance: >3 FB Neck ROM: Full    Dental  (+) Teeth Intact, Caps   Pulmonary asthma , sleep apnea and Continuous Positive Airway Pressure Ventilation , pneumonia, resolved, COPD,  COPD inhaler,  Sarcoidosis   Pulmonary exam normal breath sounds clear to auscultation       Cardiovascular hypertension, Pt. on medications + angina with exertion + CAD, + Past MI and + Cardiac Stents  Normal cardiovascular exam+ dysrhythmias Ventricular Tachycardia + pacemaker + Cardiac Defibrillator + Valvular Problems/Murmurs AS and MVP  Rhythm:Regular Rate:Normal  Non STEMI 2012 DES to RCA CABG x 3 06/05/2019 LVEF 55-60%, mild AS   Neuro/Psych Chronic inflammatory demyelinating peripheral neuropathy  Neuromuscular disease negative psych ROS   GI/Hepatic Neg liver ROS, GERD  Medicated and Controlled,Hx/o Colon Ca   Endo/Other  Hyperlipidemia Gout  Renal/GU Hx/o renal calculi  negative genitourinary   Musculoskeletal  (+) Arthritis , Osteoarthritis,  OA right Knee Chronic back pain   Abdominal   Peds  Hematology  (+) anemia , Monoclonal gammopathy IgM Eliquis therapy- last dose   Anesthesia Other Findings   Reproductive/Obstetrics                            Anesthesia Physical Anesthesia Plan  ASA: III  Anesthesia Plan: General   Post-op Pain Management:    Induction: Intravenous  PONV Risk Score and Plan: 3 and Ondansetron, Treatment may vary due to age or medical condition and Dexamethasone  Airway Management Planned: Oral ETT  Additional Equipment:   Intra-op Plan:   Post-operative Plan: Extubation in  OR  Informed Consent: I have reviewed the patients History and Physical, chart, labs and discussed the procedure including the risks, benefits and alternatives for the proposed anesthesia with the patient or authorized representative who has indicated his/her understanding and acceptance.     Dental advisory given  Plan Discussed with: CRNA and Surgeon  Anesthesia Plan Comments:        Anesthesia Quick Evaluation

## 2020-03-11 ENCOUNTER — Encounter (HOSPITAL_COMMUNITY)
Admission: RE | Disposition: A | Discharge status: Home / Self Care | Payer: Self-pay | Source: Home / Self Care | Attending: Orthopedic Surgery

## 2020-03-11 ENCOUNTER — Ambulatory Visit (HOSPITAL_COMMUNITY): Payer: Medicare Other | Admitting: Anesthesiology

## 2020-03-11 ENCOUNTER — Ambulatory Visit (HOSPITAL_COMMUNITY): Payer: Medicare Other | Admitting: Physician Assistant

## 2020-03-11 ENCOUNTER — Observation Stay (HOSPITAL_COMMUNITY)
Admission: RE | Admit: 2020-03-11 | Discharge: 2020-03-12 | Disposition: A | Discharge status: Home / Self Care | Payer: Medicare Other | Attending: Orthopedic Surgery | Admitting: Orthopedic Surgery

## 2020-03-11 ENCOUNTER — Other Ambulatory Visit: Payer: Self-pay

## 2020-03-11 ENCOUNTER — Encounter (HOSPITAL_COMMUNITY): Payer: Self-pay | Admitting: Orthopedic Surgery

## 2020-03-11 DIAGNOSIS — Z87442 Personal history of urinary calculi: Secondary | ICD-10-CM | POA: Insufficient documentation

## 2020-03-11 DIAGNOSIS — G6181 Chronic inflammatory demyelinating polyneuritis: Secondary | ICD-10-CM | POA: Insufficient documentation

## 2020-03-11 DIAGNOSIS — J449 Chronic obstructive pulmonary disease, unspecified: Secondary | ICD-10-CM | POA: Diagnosis not present

## 2020-03-11 DIAGNOSIS — M1711 Unilateral primary osteoarthritis, right knee: Principal | ICD-10-CM | POA: Insufficient documentation

## 2020-03-11 DIAGNOSIS — I480 Paroxysmal atrial fibrillation: Secondary | ICD-10-CM | POA: Diagnosis not present

## 2020-03-11 DIAGNOSIS — N400 Enlarged prostate without lower urinary tract symptoms: Secondary | ICD-10-CM | POA: Insufficient documentation

## 2020-03-11 DIAGNOSIS — Z886 Allergy status to analgesic agent status: Secondary | ICD-10-CM | POA: Insufficient documentation

## 2020-03-11 DIAGNOSIS — I10 Essential (primary) hypertension: Secondary | ICD-10-CM | POA: Insufficient documentation

## 2020-03-11 DIAGNOSIS — E785 Hyperlipidemia, unspecified: Secondary | ICD-10-CM | POA: Insufficient documentation

## 2020-03-11 DIAGNOSIS — Z7951 Long term (current) use of inhaled steroids: Secondary | ICD-10-CM | POA: Insufficient documentation

## 2020-03-11 DIAGNOSIS — Z79899 Other long term (current) drug therapy: Secondary | ICD-10-CM | POA: Diagnosis not present

## 2020-03-11 DIAGNOSIS — Z951 Presence of aortocoronary bypass graft: Secondary | ICD-10-CM | POA: Diagnosis not present

## 2020-03-11 DIAGNOSIS — Z6825 Body mass index (BMI) 25.0-25.9, adult: Secondary | ICD-10-CM | POA: Diagnosis not present

## 2020-03-11 DIAGNOSIS — Z7901 Long term (current) use of anticoagulants: Secondary | ICD-10-CM | POA: Insufficient documentation

## 2020-03-11 DIAGNOSIS — E663 Overweight: Secondary | ICD-10-CM | POA: Diagnosis not present

## 2020-03-11 DIAGNOSIS — G4733 Obstructive sleep apnea (adult) (pediatric): Secondary | ICD-10-CM | POA: Insufficient documentation

## 2020-03-11 DIAGNOSIS — Z85038 Personal history of other malignant neoplasm of large intestine: Secondary | ICD-10-CM | POA: Insufficient documentation

## 2020-03-11 DIAGNOSIS — I252 Old myocardial infarction: Secondary | ICD-10-CM | POA: Diagnosis not present

## 2020-03-11 DIAGNOSIS — M659 Synovitis and tenosynovitis, unspecified: Secondary | ICD-10-CM | POA: Diagnosis not present

## 2020-03-11 DIAGNOSIS — I472 Ventricular tachycardia: Secondary | ICD-10-CM | POA: Insufficient documentation

## 2020-03-11 DIAGNOSIS — I2510 Atherosclerotic heart disease of native coronary artery without angina pectoris: Secondary | ICD-10-CM | POA: Diagnosis not present

## 2020-03-11 DIAGNOSIS — Z950 Presence of cardiac pacemaker: Secondary | ICD-10-CM | POA: Insufficient documentation

## 2020-03-11 DIAGNOSIS — M25461 Effusion, right knee: Secondary | ICD-10-CM | POA: Diagnosis not present

## 2020-03-11 DIAGNOSIS — Z955 Presence of coronary angioplasty implant and graft: Secondary | ICD-10-CM | POA: Insufficient documentation

## 2020-03-11 DIAGNOSIS — D869 Sarcoidosis, unspecified: Secondary | ICD-10-CM | POA: Insufficient documentation

## 2020-03-11 DIAGNOSIS — Z96651 Presence of right artificial knee joint: Secondary | ICD-10-CM

## 2020-03-11 DIAGNOSIS — I341 Nonrheumatic mitral (valve) prolapse: Secondary | ICD-10-CM | POA: Diagnosis not present

## 2020-03-11 DIAGNOSIS — K219 Gastro-esophageal reflux disease without esophagitis: Secondary | ICD-10-CM | POA: Insufficient documentation

## 2020-03-11 DIAGNOSIS — M25761 Osteophyte, right knee: Secondary | ICD-10-CM | POA: Diagnosis not present

## 2020-03-11 DIAGNOSIS — Z7982 Long term (current) use of aspirin: Secondary | ICD-10-CM | POA: Insufficient documentation

## 2020-03-11 DIAGNOSIS — H4010X0 Unspecified open-angle glaucoma, stage unspecified: Secondary | ICD-10-CM | POA: Insufficient documentation

## 2020-03-11 HISTORY — PX: TOTAL KNEE ARTHROPLASTY: SHX125

## 2020-03-11 LAB — TYPE AND SCREEN
ABO/RH(D): O POS
Antibody Screen: NEGATIVE

## 2020-03-11 SURGERY — ARTHROPLASTY, KNEE, TOTAL
Anesthesia: General | Site: Knee | Laterality: Right

## 2020-03-11 MED ORDER — FENTANYL CITRATE (PF) 100 MCG/2ML IJ SOLN
INTRAMUSCULAR | Status: DC | PRN
  Administered 2020-03-11: 100 ug via INTRAVENOUS
  Administered 2020-03-11 (×2): 50 ug via INTRAVENOUS

## 2020-03-11 MED ORDER — ASPIRIN EC 81 MG PO TBEC
81.0000 mg | DELAYED_RELEASE_TABLET | Freq: Every day | ORAL | Status: DC
  Administered 2020-03-12: 81 mg via ORAL
  Filled 2020-03-11: qty 1

## 2020-03-11 MED ORDER — HYDROCODONE-ACETAMINOPHEN 5-325 MG PO TABS
1.0000 | ORAL_TABLET | ORAL | Status: DC | PRN
  Administered 2020-03-11: 1 via ORAL
  Administered 2020-03-11 (×2): 2 via ORAL
  Administered 2020-03-12: 1 via ORAL
  Administered 2020-03-12: 2 via ORAL
  Administered 2020-03-12: 1 via ORAL
  Filled 2020-03-11: qty 2
  Filled 2020-03-11: qty 1
  Filled 2020-03-11 (×3): qty 2
  Filled 2020-03-11: qty 1

## 2020-03-11 MED ORDER — ONDANSETRON HCL 4 MG PO TABS: 4.0000 mg | ORAL_TABLET | Freq: Four times a day (QID) | ORAL | Status: DC | PRN

## 2020-03-11 MED ORDER — METHOCARBAMOL 500 MG IVPB - SIMPLE MED
500.0000 mg | Freq: Four times a day (QID) | INTRAVENOUS | Status: DC | PRN
  Filled 2020-03-11: qty 50

## 2020-03-11 MED ORDER — DEXAMETHASONE SODIUM PHOSPHATE 10 MG/ML IJ SOLN: 10.0000 mg | Freq: Once | INTRAMUSCULAR | Status: DC

## 2020-03-11 MED ORDER — 0.9 % SODIUM CHLORIDE (POUR BTL) OPTIME
TOPICAL | Status: DC | PRN
  Administered 2020-03-11: 1000 mL

## 2020-03-11 MED ORDER — HYDROCODONE-ACETAMINOPHEN 7.5-325 MG PO TABS: 1.0000 | ORAL_TABLET | ORAL | Status: DC | PRN

## 2020-03-11 MED ORDER — CHLORHEXIDINE GLUCONATE 4 % EX LIQD: 60.0000 mL | Freq: Once | CUTANEOUS | Status: DC

## 2020-03-11 MED ORDER — CELECOXIB 200 MG PO CAPS
200.0000 mg | ORAL_CAPSULE | Freq: Two times a day (BID) | ORAL | Status: DC
  Filled 2020-03-11: qty 1

## 2020-03-11 MED ORDER — FENTANYL CITRATE (PF) 100 MCG/2ML IJ SOLN
25.0000 ug | INTRAMUSCULAR | Status: DC | PRN
  Administered 2020-03-11: 50 ug via INTRAVENOUS

## 2020-03-11 MED ORDER — BISACODYL 10 MG RE SUPP: 10.0000 mg | Freq: Every day | RECTAL | Status: DC | PRN

## 2020-03-11 MED ORDER — METHOCARBAMOL 500 MG PO TABS
500.0000 mg | ORAL_TABLET | Freq: Four times a day (QID) | ORAL | Status: DC | PRN
  Administered 2020-03-12: 500 mg via ORAL
  Filled 2020-03-11: qty 1

## 2020-03-11 MED ORDER — CEFAZOLIN SODIUM-DEXTROSE 2-4 GM/100ML-% IV SOLN
2.0000 g | Freq: Four times a day (QID) | INTRAVENOUS | Status: AC
  Administered 2020-03-11 (×2): 2 g via INTRAVENOUS
  Filled 2020-03-11 (×2): qty 100

## 2020-03-11 MED ORDER — OXYCODONE HCL 5 MG PO TABS: 5.0000 mg | ORAL_TABLET | Freq: Once | ORAL | Status: DC | PRN

## 2020-03-11 MED ORDER — ACETAMINOPHEN 10 MG/ML IV SOLN
INTRAVENOUS | Status: AC
  Filled 2020-03-11: qty 100

## 2020-03-11 MED ORDER — POLYVINYL ALCOHOL 1.4 % OP SOLN
1.0000 [drp] | OPHTHALMIC | Status: DC | PRN
  Filled 2020-03-11: qty 15

## 2020-03-11 MED ORDER — KETOROLAC TROMETHAMINE 30 MG/ML IJ SOLN
INTRAMUSCULAR | Status: AC
  Filled 2020-03-11: qty 1

## 2020-03-11 MED ORDER — BUPIVACAINE HCL 0.25 % IJ SOLN
INTRAMUSCULAR | Status: AC
  Filled 2020-03-11: qty 1

## 2020-03-11 MED ORDER — PROPOFOL 10 MG/ML IV BOLUS
INTRAVENOUS | Status: AC
  Filled 2020-03-11: qty 40

## 2020-03-11 MED ORDER — BUDESONIDE 0.25 MG/2ML IN SUSP: 0.2500 mg | Freq: Two times a day (BID) | RESPIRATORY_TRACT | Status: DC

## 2020-03-11 MED ORDER — SUGAMMADEX SODIUM 200 MG/2ML IV SOLN
INTRAVENOUS | Status: DC | PRN
  Administered 2020-03-11: 350 mg via INTRAVENOUS

## 2020-03-11 MED ORDER — OXYCODONE HCL 5 MG/5ML PO SOLN: 5.0000 mg | Freq: Once | ORAL | Status: DC | PRN

## 2020-03-11 MED ORDER — LIDOCAINE 2% (20 MG/ML) 5 ML SYRINGE
INTRAMUSCULAR | Status: DC | PRN
  Administered 2020-03-11: 100 mg via INTRAVENOUS

## 2020-03-11 MED ORDER — ROCURONIUM BROMIDE 10 MG/ML (PF) SYRINGE
PREFILLED_SYRINGE | INTRAVENOUS | Status: DC | PRN
  Administered 2020-03-11: 60 mg via INTRAVENOUS
  Administered 2020-03-11: 10 mg via INTRAVENOUS

## 2020-03-11 MED ORDER — ROSUVASTATIN CALCIUM 10 MG PO TABS
10.0000 mg | ORAL_TABLET | Freq: Every day | ORAL | Status: DC
  Administered 2020-03-11: 10 mg via ORAL
  Filled 2020-03-11 (×2): qty 1

## 2020-03-11 MED ORDER — METHOCARBAMOL 500 MG IVPB - SIMPLE MED
INTRAVENOUS | Status: AC
  Administered 2020-03-11: 500 mg via INTRAVENOUS
  Filled 2020-03-11: qty 50

## 2020-03-11 MED ORDER — ONDANSETRON HCL 4 MG/2ML IJ SOLN
INTRAMUSCULAR | Status: DC | PRN
  Administered 2020-03-11: 4 mg via INTRAVENOUS

## 2020-03-11 MED ORDER — FERROUS SULFATE 325 (65 FE) MG PO TABS
325.0000 mg | ORAL_TABLET | Freq: Two times a day (BID) | ORAL | Status: DC
  Administered 2020-03-11 – 2020-03-12 (×2): 325 mg via ORAL
  Filled 2020-03-11 (×2): qty 1

## 2020-03-11 MED ORDER — HYDROMORPHONE HCL 1 MG/ML IJ SOLN
INTRAMUSCULAR | Status: AC
  Filled 2020-03-11: qty 1

## 2020-03-11 MED ORDER — MORPHINE SULFATE (PF) 2 MG/ML IV SOLN: 0.5000 mg | INTRAVENOUS | Status: DC | PRN

## 2020-03-11 MED ORDER — DEXAMETHASONE SODIUM PHOSPHATE 10 MG/ML IJ SOLN
INTRAMUSCULAR | Status: DC | PRN
  Administered 2020-03-11: 10 mg via INTRAVENOUS

## 2020-03-11 MED ORDER — POLYETHYLENE GLYCOL 3350 17 G PO PACK: 17.0000 g | PACK | Freq: Two times a day (BID) | ORAL | Status: DC

## 2020-03-11 MED ORDER — KETOROLAC TROMETHAMINE 30 MG/ML IJ SOLN
INTRAMUSCULAR | Status: DC | PRN
  Administered 2020-03-11: 30 mg

## 2020-03-11 MED ORDER — PROPOFOL 1000 MG/100ML IV EMUL
INTRAVENOUS | Status: AC
  Filled 2020-03-11: qty 100

## 2020-03-11 MED ORDER — TRANEXAMIC ACID-NACL 1000-0.7 MG/100ML-% IV SOLN
1000.0000 mg | INTRAVENOUS | Status: AC
  Administered 2020-03-11: 1000 mg via INTRAVENOUS
  Filled 2020-03-11: qty 100

## 2020-03-11 MED ORDER — PHENOL 1.4 % MT LIQD: 1.0000 | OROMUCOSAL | Status: DC | PRN

## 2020-03-11 MED ORDER — ACETAMINOPHEN 325 MG PO TABS: 325.0000 mg | ORAL_TABLET | Freq: Four times a day (QID) | ORAL | Status: DC | PRN

## 2020-03-11 MED ORDER — DOCUSATE SODIUM 100 MG PO CAPS
100.0000 mg | ORAL_CAPSULE | Freq: Two times a day (BID) | ORAL | Status: DC
  Administered 2020-03-11 – 2020-03-12 (×2): 100 mg via ORAL
  Filled 2020-03-11 (×2): qty 1

## 2020-03-11 MED ORDER — EPHEDRINE 5 MG/ML INJ
INTRAVENOUS | Status: AC
  Filled 2020-03-11: qty 10

## 2020-03-11 MED ORDER — FINASTERIDE 5 MG PO TABS
5.0000 mg | ORAL_TABLET | Freq: Every day | ORAL | Status: DC
  Administered 2020-03-12: 5 mg via ORAL
  Filled 2020-03-11: qty 1

## 2020-03-11 MED ORDER — MIDAZOLAM HCL 5 MG/5ML IJ SOLN
INTRAMUSCULAR | Status: DC | PRN
  Administered 2020-03-11: 2 mg via INTRAVENOUS

## 2020-03-11 MED ORDER — PROPOFOL 10 MG/ML IV BOLUS
INTRAVENOUS | Status: DC | PRN
  Administered 2020-03-11: 20 mg via INTRAVENOUS
  Administered 2020-03-11: 150 mg via INTRAVENOUS

## 2020-03-11 MED ORDER — BUPIVACAINE HCL (PF) 0.25 % IJ SOLN
INTRAMUSCULAR | Status: DC | PRN
  Administered 2020-03-11: 30 mL

## 2020-03-11 MED ORDER — SODIUM CHLORIDE (PF) 0.9 % IJ SOLN
INTRAMUSCULAR | Status: AC
  Filled 2020-03-11: qty 50

## 2020-03-11 MED ORDER — APIXABAN 2.5 MG PO TABS
2.5000 mg | ORAL_TABLET | Freq: Two times a day (BID) | ORAL | Status: DC
  Administered 2020-03-12: 2.5 mg via ORAL
  Filled 2020-03-11: qty 1

## 2020-03-11 MED ORDER — EPHEDRINE SULFATE-NACL 50-0.9 MG/10ML-% IV SOSY
PREFILLED_SYRINGE | INTRAVENOUS | Status: DC | PRN
  Administered 2020-03-11: 5 mg via INTRAVENOUS
  Administered 2020-03-11: 10 mg via INTRAVENOUS

## 2020-03-11 MED ORDER — DEXMEDETOMIDINE HCL IN NACL 200 MCG/50ML IV SOLN
INTRAVENOUS | Status: DC | PRN
  Administered 2020-03-11 (×2): 4 ug via INTRAVENOUS
  Administered 2020-03-11: 8 ug via INTRAVENOUS

## 2020-03-11 MED ORDER — MIDAZOLAM HCL 2 MG/2ML IJ SOLN
INTRAMUSCULAR | Status: AC
  Filled 2020-03-11: qty 2

## 2020-03-11 MED ORDER — HYDROMORPHONE HCL 1 MG/ML IJ SOLN: 0.2500 mg | INTRAMUSCULAR | Status: DC | PRN

## 2020-03-11 MED ORDER — ALBUTEROL (5 MG/ML) CONTINUOUS INHALATION SOLN
3.0000 mL | INHALATION_SOLUTION | Freq: Four times a day (QID) | RESPIRATORY_TRACT | Status: DC | PRN
  Filled 2020-03-11: qty 20

## 2020-03-11 MED ORDER — TRIAMTERENE-HCTZ 37.5-25 MG PO TABS
1.0000 | ORAL_TABLET | Freq: Every day | ORAL | Status: DC
  Administered 2020-03-12: 1 via ORAL
  Filled 2020-03-11: qty 1

## 2020-03-11 MED ORDER — MAGNESIUM CITRATE PO SOLN: 1.0000 | Freq: Once | ORAL | Status: DC | PRN

## 2020-03-11 MED ORDER — POVIDONE-IODINE 10 % EX SWAB
2.0000 "application " | Freq: Once | CUTANEOUS | Status: AC
  Administered 2020-03-11: 2 via TOPICAL

## 2020-03-11 MED ORDER — METOCLOPRAMIDE HCL 5 MG/ML IJ SOLN: 5.0000 mg | Freq: Three times a day (TID) | INTRAMUSCULAR | Status: DC | PRN

## 2020-03-11 MED ORDER — SODIUM CHLORIDE 0.9 % IV SOLN: INTRAVENOUS | Status: DC

## 2020-03-11 MED ORDER — FLUTICASONE PROPIONATE 50 MCG/ACT NA SUSP
2.0000 | Freq: Two times a day (BID) | NASAL | Status: DC
  Administered 2020-03-11 – 2020-03-12 (×2): 2 via NASAL
  Filled 2020-03-11: qty 16

## 2020-03-11 MED ORDER — AMLODIPINE BESYLATE 5 MG PO TABS
5.0000 mg | ORAL_TABLET | Freq: Every day | ORAL | Status: DC
  Administered 2020-03-12: 5 mg via ORAL
  Filled 2020-03-11: qty 1

## 2020-03-11 MED ORDER — STERILE WATER FOR IRRIGATION IR SOLN
Status: DC | PRN
  Administered 2020-03-11: 1000 mL

## 2020-03-11 MED ORDER — CEFAZOLIN SODIUM-DEXTROSE 2-4 GM/100ML-% IV SOLN
2.0000 g | INTRAVENOUS | Status: AC
  Administered 2020-03-11: 2 g via INTRAVENOUS
  Filled 2020-03-11: qty 100

## 2020-03-11 MED ORDER — PROPOFOL 500 MG/50ML IV EMUL
INTRAVENOUS | Status: AC
  Filled 2020-03-11: qty 50

## 2020-03-11 MED ORDER — DRONEDARONE HCL 400 MG PO TABS
400.0000 mg | ORAL_TABLET | Freq: Two times a day (BID) | ORAL | Status: DC
  Administered 2020-03-11 – 2020-03-12 (×2): 400 mg via ORAL
  Filled 2020-03-11 (×2): qty 1

## 2020-03-11 MED ORDER — TRANEXAMIC ACID-NACL 1000-0.7 MG/100ML-% IV SOLN
1000.0000 mg | Freq: Once | INTRAVENOUS | Status: AC
  Administered 2020-03-11: 1000 mg via INTRAVENOUS
  Filled 2020-03-11: qty 100

## 2020-03-11 MED ORDER — LATANOPROST 0.005 % OP SOLN
1.0000 [drp] | Freq: Every day | OPHTHALMIC | Status: DC
  Administered 2020-03-11: 1 [drp] via OPHTHALMIC
  Filled 2020-03-11: qty 2.5

## 2020-03-11 MED ORDER — SODIUM CHLORIDE (PF) 0.9 % IJ SOLN
INTRAMUSCULAR | Status: DC | PRN
  Administered 2020-03-11: 30 mL

## 2020-03-11 MED ORDER — METOCLOPRAMIDE HCL 5 MG PO TABS: 5.0000 mg | ORAL_TABLET | Freq: Three times a day (TID) | ORAL | Status: DC | PRN

## 2020-03-11 MED ORDER — MENTHOL 3 MG MT LOZG: 1.0000 | LOZENGE | OROMUCOSAL | Status: DC | PRN

## 2020-03-11 MED ORDER — PANTOPRAZOLE SODIUM 40 MG PO TBEC
40.0000 mg | DELAYED_RELEASE_TABLET | Freq: Every day | ORAL | Status: DC
  Administered 2020-03-12: 40 mg via ORAL
  Filled 2020-03-11: qty 1

## 2020-03-11 MED ORDER — TADALAFIL 5 MG PO TABS
5.0000 mg | ORAL_TABLET | Freq: Every day | ORAL | Status: DC
  Administered 2020-03-11: 5 mg via ORAL
  Filled 2020-03-11: qty 1

## 2020-03-11 MED ORDER — DEXAMETHASONE SODIUM PHOSPHATE 10 MG/ML IJ SOLN
10.0000 mg | Freq: Once | INTRAMUSCULAR | Status: AC
  Administered 2020-03-12: 10 mg via INTRAVENOUS
  Filled 2020-03-11: qty 1

## 2020-03-11 MED ORDER — DORZOLAMIDE HCL-TIMOLOL MAL 2-0.5 % OP SOLN
1.0000 [drp] | Freq: Two times a day (BID) | OPHTHALMIC | Status: DC
  Administered 2020-03-11 – 2020-03-12 (×2): 1 [drp] via OPHTHALMIC
  Filled 2020-03-11: qty 10

## 2020-03-11 MED ORDER — ACETAMINOPHEN 10 MG/ML IV SOLN
INTRAVENOUS | Status: DC | PRN
  Administered 2020-03-11: 1000 mg via INTRAVENOUS

## 2020-03-11 MED ORDER — DIPHENHYDRAMINE HCL 12.5 MG/5ML PO ELIX: 12.5000 mg | ORAL_SOLUTION | ORAL | Status: DC | PRN

## 2020-03-11 MED ORDER — ONDANSETRON HCL 4 MG/2ML IJ SOLN: 4.0000 mg | Freq: Once | INTRAMUSCULAR | Status: DC | PRN

## 2020-03-11 MED ORDER — LACTATED RINGERS IV SOLN: INTRAVENOUS | Status: DC

## 2020-03-11 MED ORDER — POLYETHYL GLYCOL-PROPYL GLYCOL 0.4-0.3 % OP GEL: Freq: Every day | OPHTHALMIC | Status: DC | PRN

## 2020-03-11 MED ORDER — FENTANYL CITRATE (PF) 100 MCG/2ML IJ SOLN
INTRAMUSCULAR | Status: AC
  Administered 2020-03-11: 50 ug via INTRAVENOUS
  Filled 2020-03-11: qty 2

## 2020-03-11 MED ORDER — ONDANSETRON HCL 4 MG/2ML IJ SOLN: 4.0000 mg | Freq: Four times a day (QID) | INTRAMUSCULAR | Status: DC | PRN

## 2020-03-11 MED ORDER — ALUM & MAG HYDROXIDE-SIMETH 200-200-20 MG/5ML PO SUSP
15.0000 mL | ORAL | Status: DC | PRN
  Administered 2020-03-12: 15 mL via ORAL
  Filled 2020-03-11: qty 30

## 2020-03-11 MED ORDER — FENTANYL CITRATE (PF) 100 MCG/2ML IJ SOLN
INTRAMUSCULAR | Status: AC
  Filled 2020-03-11: qty 2

## 2020-03-11 SURGICAL SUPPLY — 62 items
ATTUNE MED ANAT PAT 41 KNEE (Knees) ×1 IMPLANT
ATTUNE PS FEM RT SZ 6 CEM KNEE (Femur) ×1 IMPLANT
ATTUNE PSRP INSR SZ6 6 KNEE (Insert) ×1 IMPLANT
BAG ZIPLOCK 12X15 (MISCELLANEOUS) IMPLANT
BASE TIBIAL ROT PLAT SZ 7 KNEE (Knees) IMPLANT
BLADE SAW SGTL 11.0X1.19X90.0M (BLADE) IMPLANT
BLADE SAW SGTL 13.0X1.19X90.0M (BLADE) ×2 IMPLANT
BLADE SURG SZ10 CARB STEEL (BLADE) ×4 IMPLANT
BNDG ELASTIC 6X15 VLCR STRL LF (GAUZE/BANDAGES/DRESSINGS) ×1 IMPLANT
BNDG ELASTIC 6X5.8 VLCR STR LF (GAUZE/BANDAGES/DRESSINGS) ×2 IMPLANT
BOWL SMART MIX CTS (DISPOSABLE) ×2 IMPLANT
CEMENT HV SMART SET (Cement) ×1 IMPLANT
COVER SURGICAL LIGHT HANDLE (MISCELLANEOUS) ×2 IMPLANT
COVER WAND RF STERILE (DRAPES) IMPLANT
CUFF TOURN SGL QUICK 34 (TOURNIQUET CUFF) ×1
CUFF TRNQT CYL 34X4.125X (TOURNIQUET CUFF) ×1 IMPLANT
DECANTER SPIKE VIAL GLASS SM (MISCELLANEOUS) ×4 IMPLANT
DERMABOND ADVANCED (GAUZE/BANDAGES/DRESSINGS) ×1
DERMABOND ADVANCED .7 DNX12 (GAUZE/BANDAGES/DRESSINGS) ×1 IMPLANT
DRAPE U-SHAPE 47X51 STRL (DRAPES) ×2 IMPLANT
DRESSING AQUACEL AG SP 3.5X10 (GAUZE/BANDAGES/DRESSINGS) ×1 IMPLANT
DRSG AQUACEL AG ADV 3.5X10 (GAUZE/BANDAGES/DRESSINGS) ×1 IMPLANT
DRSG AQUACEL AG SP 3.5X10 (GAUZE/BANDAGES/DRESSINGS) ×2
DURAPREP 26ML APPLICATOR (WOUND CARE) ×4 IMPLANT
ELECT REM PT RETURN 15FT ADLT (MISCELLANEOUS) ×2 IMPLANT
GLOVE BIO SURGEON STRL SZ 6 (GLOVE) ×2 IMPLANT
GLOVE BIOGEL PI IND STRL 6.5 (GLOVE) ×1 IMPLANT
GLOVE BIOGEL PI IND STRL 7.5 (GLOVE) ×1 IMPLANT
GLOVE BIOGEL PI IND STRL 8.5 (GLOVE) ×1 IMPLANT
GLOVE BIOGEL PI INDICATOR 6.5 (GLOVE) ×1
GLOVE BIOGEL PI INDICATOR 7.5 (GLOVE) ×1
GLOVE BIOGEL PI INDICATOR 8.5 (GLOVE) ×1
GLOVE ECLIPSE 8.0 STRL XLNG CF (GLOVE) ×2 IMPLANT
GLOVE ORTHO TXT STRL SZ7.5 (GLOVE) ×2 IMPLANT
GOWN STRL REUS W/ TWL LRG LVL3 (GOWN DISPOSABLE) ×1 IMPLANT
GOWN STRL REUS W/TWL 2XL LVL3 (GOWN DISPOSABLE) ×2 IMPLANT
GOWN STRL REUS W/TWL LRG LVL3 (GOWN DISPOSABLE) ×3 IMPLANT
HANDPIECE INTERPULSE COAX TIP (DISPOSABLE) ×1
HOLDER FOLEY CATH W/STRAP (MISCELLANEOUS) IMPLANT
KIT TURNOVER KIT A (KITS) IMPLANT
MANIFOLD NEPTUNE II (INSTRUMENTS) ×2 IMPLANT
NDL SAFETY ECLIPSE 18X1.5 (NEEDLE) IMPLANT
NEEDLE HYPO 18GX1.5 SHARP (NEEDLE)
NS IRRIG 1000ML POUR BTL (IV SOLUTION) ×2 IMPLANT
PACK TOTAL KNEE CUSTOM (KITS) ×2 IMPLANT
PENCIL SMOKE EVACUATOR (MISCELLANEOUS) IMPLANT
PIN DRILL FIX HALF THREAD (BIT) ×1 IMPLANT
PIN FIX SIGMA LCS THRD HI (PIN) ×1 IMPLANT
PROTECTOR NERVE ULNAR (MISCELLANEOUS) ×2 IMPLANT
SET HNDPC FAN SPRY TIP SCT (DISPOSABLE) ×1 IMPLANT
SET PAD KNEE POSITIONER (MISCELLANEOUS) ×2 IMPLANT
SUT MNCRL AB 4-0 PS2 18 (SUTURE) ×2 IMPLANT
SUT STRATAFIX PDS+ 0 24IN (SUTURE) ×2 IMPLANT
SUT VIC AB 1 CT1 36 (SUTURE) ×2 IMPLANT
SUT VIC AB 2-0 CT1 27 (SUTURE) ×3
SUT VIC AB 2-0 CT1 TAPERPNT 27 (SUTURE) ×3 IMPLANT
SYR 3ML LL SCALE MARK (SYRINGE) ×2 IMPLANT
TIBIAL BASE ROT PLAT SZ 7 KNEE (Knees) ×2 IMPLANT
TRAY FOLEY MTR SLVR 16FR STAT (SET/KITS/TRAYS/PACK) ×2 IMPLANT
WATER STERILE IRR 1000ML POUR (IV SOLUTION) ×4 IMPLANT
WRAP KNEE MAXI GEL POST OP (GAUZE/BANDAGES/DRESSINGS) ×2 IMPLANT
YANKAUER SUCT BULB TIP 10FT TU (MISCELLANEOUS) ×2 IMPLANT

## 2020-03-11 NOTE — Transfer of Care (Signed)
Immediate Anesthesia Transfer of Care Note  Patient: Derrick [name]  Procedure(s) Performed: Procedure(s) with comments: TOTAL KNEE ARTHROPLASTY (Right) - 70 mins  Patient Location: PACU  Anesthesia Type:General  Level of Consciousness:  sedated, patient cooperative and responds to stimulation  Airway & Oxygen Therapy:Patient Spontanous Breathing and Patient connected to face mask oxgen  Post-op Assessment:  Report given to PACU RN and Post -op Vital signs reviewed and stable  Post vital signs:  Reviewed and stable  Last Vitals:  Vitals:   03/11/20 0555 03/11/20 0915  BP: 112/62 138/72  Pulse: 60 60  Resp: 18 13  Temp: 37.1 C 36.9 C  SpO2: A999333 [id number]    Complications: No apparent anesthesia complications

## 2020-03-11 NOTE — Care Plan (Signed)
Ortho Bundle Case Management Note  Patient Details  Name: Derrick [name] MRN: [id number] Date of Birth: Apr 16, 1941  R TKA on 03-11-20 DCP:  Home with spouse and son.  2 story home with 2-3 ste. DME:  RW and 3-in-1 ordered through [place] PT:  [facility].  PT eval scheduled on 03-14-20 at 10:00 am.                   DME Arranged:  Gilford Rile rolling, 3-N-1 DME Agency:  [employer]  HH Arranged:  NA HH Agency:  NA  Additional Comments: Please contact me with any questions of if this plan should need to change.  Marianne Sofia, RN,CCM [facility]  [phone] 03/11/2020, 12:05 PM

## 2020-03-11 NOTE — Anesthesia Postprocedure Evaluation (Signed)
Anesthesia Post Note  Patient: Derrick [name]  Procedure(s) Performed: TOTAL KNEE ARTHROPLASTY (Right Knee)     Patient location during evaluation: PACU Anesthesia Type: General Level of consciousness: awake and alert and oriented Pain management: pain level controlled Vital Signs Assessment: post-procedure vital signs reviewed and stable Respiratory status: spontaneous breathing, nonlabored ventilation and respiratory function stable Cardiovascular status: blood pressure returned to baseline and stable Postop Assessment: no apparent nausea or vomiting Anesthetic complications: no    Last Vitals:  Vitals:   03/11/20 1000 03/11/20 1015  BP: (!) 142/77 136/70  Pulse: 60 60  Resp: 14 13  Temp:    SpO2: 100% 100%    Last Pain:  Vitals:   03/11/20 1000  TempSrc:   PainSc: 6                  Curtistine Pettitt A.

## 2020-03-11 NOTE — Interval H&P Note (Signed)
History and Physical Interval Note:  03/11/2020 7:12 AM  Derrick [name]  has presented today for surgery, with the diagnosis of Right knee osteoarthritis.  The various methods of treatment have been discussed with the patient and family. After consideration of risks, benefits and other options for treatment, the patient has consented to  Procedure(s) with comments: TOTAL KNEE ARTHROPLASTY (Right) - 70 mins as a surgical intervention.  The patient's history has been reviewed, patient examined, no change in status, stable for surgery.  I have reviewed the patient's chart and labs.  Questions were answered to the patient's satisfaction.     Mauri Pole

## 2020-03-11 NOTE — Anesthesia Procedure Notes (Signed)
Procedure Name: Intubation Date/Time: 03/11/2020 7:47 AM Performed by: Lavina Hamman, CRNA Pre-anesthesia Checklist: Patient identified, Emergency Drugs available, Suction available, Patient being monitored and Timeout performed Patient Re-evaluated:Patient Re-evaluated prior to induction Oxygen Delivery Method: Circle system utilized Preoxygenation: Pre-oxygenation with 100% oxygen Induction Type: IV induction Ventilation: Mask ventilation without difficulty Laryngoscope Size: Mac and 4 Grade View: Grade III Tube type: Oral Tube size: 7.5 mm Number of attempts: 2 Airway Equipment and Method: Stylet and Video-laryngoscopy Placement Confirmation: ETT inserted through vocal cords under direct vision,  positive ETCO2,  CO2 detector and breath sounds checked- equal and bilateral Secured at: 23 cm Tube secured with: Tape Dental Injury: Teeth and Oropharynx as per pre-operative assessment  Difficulty Due To: Difficulty was anticipated, Difficult Airway- due to reduced neck mobility and Difficult Airway- due to anterior larynx Comments: ATOI.  G3 with Mac 4.  G1 with Glidescope 4.

## 2020-03-11 NOTE — Op Note (Signed)
NAME:  Derrick [name] [facility]                      MEDICAL RECORD NO.:  [id number]                             FACILITY:  [facility]      PHYSICIAN:  Pietro Cassis. Alvan Dame, M.D.  DATE OF BIRTH:  03-18-1941      DATE OF PROCEDURE:  03/11/2020                                     OPERATIVE REPORT         PREOPERATIVE DIAGNOSIS:  Right knee osteoarthritis.      POSTOPERATIVE DIAGNOSIS:  Right knee osteoarthritis.      FINDINGS:  The patient was noted to have complete loss of cartilage and   bone-on-bone arthritis with associated osteophytes in the medial and patellofemoral compartments of   the knee with a significant synovitis and associated effusion.  The patient had failed months of conservative treatment including medications, injection therapy, activity modification.     PROCEDURE:  Right total knee replacement.      COMPONENTS USED:  DePuy Attune rotating platform posterior stabilized knee   system, a size 6 femur, 7 tibia, size 6 mm PS AOX insert, and 41 anatomic patellar   button.      SURGEON:  Pietro Cassis. Alvan Dame, M.D.      ASSISTANT:  Griffith Citron, PA-C.      ANESTHESIA:  General.      SPECIMENS:  None.      COMPLICATION:  None.      DRAINS:  None.  EBL: <200cc      TOURNIQUET TIME:   Total Tourniquet Time Documented: Thigh (Right) - 34 minutes Total: Thigh (Right) - 34 minutes  .      The patient was stable to the recovery room.      INDICATION FOR PROCEDURE:  Derrick [name] is a 79 y.o. male patient of   mine.  The patient had been seen, evaluated, and treated for months conservatively in the   office with medication, activity modification, and injections.  The patient had   radiographic changes of bone-on-bone arthritis with endplate sclerosis and osteophytes noted.  Based on the radiographic changes and failed conservative measures, the patient   decided to proceed with definitive treatment, total knee replacement.  Risks of infection, DVT, component failure, need for  revision surgery, neurovascular injury were reviewed in the office setting.  The postop course was reviewed stressing the efforts to maximize post-operative satisfaction and function.  Consent was obtained for benefit of pain   relief.      PROCEDURE IN DETAIL:  The patient was brought to the operative theater.   Once adequate anesthesia, preoperative antibiotics, 2 gm of Ancef,1 gm of Tranexamic Acid, and 10 mg of Decadron administered, the patient was positioned supine with a right thigh tourniquet placed.  The  right lower extremity was prepped and draped in sterile fashion.  A time-   out was performed identifying the patient, planned procedure, and the appropriate extremity.      The right lower extremity was placed in the [facility] leg holder.  The leg was   exsanguinated, tourniquet elevated to 250 mmHg.  A midline incision was   made followed by  median parapatellar arthrotomy.  Following initial   exposure, attention was first directed to the patella.  Precut   measurement was noted to be 24-25 mm.  I resected down to 14 mm and used a   41 anatomic patellar button to restore patellar height as well as cover the cut surface.      The lug holes were drilled and a metal shim was placed to protect the   patella from retractors and saw blade during the procedure.      At this point, attention was now directed to the femur.  The femoral   canal was opened with a drill, irrigated to try to prevent fat emboli.  An   intramedullary rod was passed at 3 degrees valgus, 10 mm of bone was   resected off the distal femur.  Following this resection, the tibia was   subluxated anteriorly.  Using the extramedullary guide, 2 mm of bone was resected off   the proximal medial tibia.  We confirmed the gap would be   stable medially and laterally with a size 5 spacer block as well as confirmed that the tibial cut was perpendicular in the coronal plane, checking with an alignment rod.      Once this was  done, I sized the femur to be a size 6 in the anterior-   posterior dimension, chose a standard component based on medial and   lateral dimension.  The size 6 rotation block was then pinned in   position anterior referenced using the C-clamp to set rotation.  The   anterior, posterior, and  chamfer cuts were made without difficulty nor   notching making certain that I was along the anterior cortex to help   with flexion gap stability.      The final box cut was made off the lateral aspect of distal femur.      At this point, the tibia was sized to be a size 7.  The size 7 tray was   then pinned in position through the medial third of the tubercle,   drilled, and keel punched.  Trial reduction was now carried with a 6 femur,  7 tibia, a size 6 mm PS insert, and the 41 anatomic patella botton.  The knee was brought to full extension with good flexion stability with the patella   tracking through the trochlea without application of pressure.  Given   all these findings the trial components removed.  Final components were   opened and cement was mixed.  The knee was irrigated with normal saline solution and pulse lavage.  The synovial lining was   then injected with 30 cc of 0.25% Marcaine with epinephrine, 1 cc of Toradol and 30 cc of NS for a total of 61 cc.     Final implants were then cemented onto cleaned and dried cut surfaces of bone with the knee brought to extension with a size 6 mm PS trial insert.      Once the cement had fully cured, excess cement was removed   throughout the knee.  I confirmed that I was satisfied with the range of   motion and stability, and the final size 6 mm PS AOX insert was chosen.  It was   placed into the knee.      The tourniquet had been let down at 34 minutes.  No significant   hemostasis was required.  The extensor mechanism was then reapproximated using #1 Vicryl and #1 Stratafix  sutures with the knee   in flexion.  The   remaining wound was closed  with 2-0 Vicryl and running 4-0 Monocryl.   The knee was cleaned, dried, dressed sterilely using Dermabond and   Aquacel dressing.  The patient was then   brought to recovery room in stable condition, tolerating the procedure   well.   Please note that Physician Assistant, Griffith Citron, PA-C was present for the entirety of the case, and was utilized for pre-operative positioning, peri-operative retractor management, general facilitation of the procedure and for primary wound closure at the end of the case.              Pietro Cassis Alvan Dame, M.D.    03/11/2020 8:53 AM

## 2020-03-11 NOTE — Evaluation (Signed)
Physical Therapy Evaluation Patient Details Name: Derrick [name] MRN: [id number] DOB: April 03, 1941 Today's Date: 03/11/2020   History of Present Illness  Pt is a 79 year old male s/p Right TKA  Clinical Impression  Pt is s/p TKA resulting in the deficits listed below (see PT Problem List).  Pt will benefit from skilled PT to increase their independence and safety with mobility to allow discharge to the venue listed below.  Pt assisted to bathroom (per request) and then ambulated in hallway POD#0.       Follow Up Recommendations Follow surgeon's recommendation for DC plan and follow-up therapies    Equipment Recommendations  Rolling walker with 5" wheels;3in1 (PT)    Recommendations for Other Services       Precautions / Restrictions Precautions Precautions: Knee;Fall Restrictions Weight Bearing Restrictions: No      Mobility  Bed Mobility Overal bed mobility: Needs Assistance Bed Mobility: Supine to Sit     Supine to sit: Min assist     General bed mobility comments: assist for R LE, cues for technique  Transfers Overall transfer level: Needs assistance Equipment used: Rolling walker (2 wheeled) Transfers: Sit to/from Stand Sit to Stand: Min assist         General transfer comment: slight assist to rise and steady, cues for UE and LE positioning  Ambulation/Gait Ambulation/Gait assistance: Min guard Gait Distance (Feet): 60 Feet Assistive device: Rolling walker (2 wheeled) Gait Pattern/deviations: Step-through pattern;Decreased stride length;Antalgic     General Gait Details: verbal cues for sequence, RW positioning, posture, step length  Stairs            Wheelchair Mobility    Modified Rankin (Stroke Patients Only)       Balance                                             Pertinent Vitals/Pain Pain Assessment: 0-10 Pain Score: 3  Pain Location: right knee Pain Descriptors / Indicators: Sore Pain Intervention(s):  Monitored during session;Repositioned    Home Living Family/patient expects to be discharged to:: Private residence Living Arrangements: Spouse/significant other Available Help at Discharge: Family Type of Home: House Home Access: Stairs to enter Entrance Stairs-Rails: Left Entrance Stairs-Number of Steps: 3 Home Layout: Able to live on main level with bedroom/bathroom Home Equipment: None      Prior Function Level of Independence: Independent               Hand Dominance        Extremity/Trunk Assessment        Lower Extremity Assessment Lower Extremity Assessment: RLE deficits/detail RLE Deficits / Details: good quad contraction, unable to perform SLR       Communication   Communication: No difficulties  Cognition Arousal/Alertness: Awake/alert Behavior During Therapy: WFL for tasks assessed/performed Overall Cognitive Status: Within Functional Limits for tasks assessed                                        General Comments      Exercises     Assessment/Plan    PT Assessment Patient needs continued PT services  PT Problem List Decreased strength;Decreased mobility;Decreased activity tolerance;Decreased balance;Decreased knowledge of use of DME;Pain;Decreased range of motion       PT Treatment  Interventions Stair training;Gait training;DME instruction;Therapeutic exercise;Functional mobility training;Therapeutic activities;Patient/family education    PT Goals (Current goals can be found in the Care Plan section)  Acute Rehab PT Goals PT Goal Formulation: With patient Time For Goal Achievement: 03/15/20 Potential to Achieve Goals: Good    Frequency 7X/week   Barriers to discharge        Co-evaluation               AM-PAC PT "6 Clicks" Mobility  Outcome Measure Help needed turning from your back to your side while in a flat bed without using bedrails?: A Little Help needed moving from lying on your back to sitting on  the side of a flat bed without using bedrails?: A Little Help needed moving to and from a bed to a chair (including a wheelchair)?: A Little Help needed standing up from a chair using your arms (e.g., wheelchair or bedside chair)?: A Little Help needed to walk in hospital room?: A Little Help needed climbing 3-5 steps with a railing? : A Lot 6 Click Score: 17    End of Session Equipment Utilized During Treatment: Gait belt Activity Tolerance: Patient tolerated treatment well Patient left: in chair;with call bell/phone within reach;with chair alarm set;with family/visitor present   PT Visit Diagnosis: Other abnormalities of gait and mobility (R26.89)    Time: [id number] PT Time Calculation (min) (ACUTE ONLY): 19 min   Charges:   PT Evaluation $PT Eval Low Complexity: 1 Low     Kati PT, DPT Acute Rehabilitation Services Office: [phone]  Trena Platt 03/11/2020, 2:44 PM

## 2020-03-11 NOTE — Discharge Instructions (Signed)
INSTRUCTIONS AFTER JOINT REPLACEMENT   o Remove items at home which could result in a fall. This includes throw rugs or furniture in walking pathways o ICE to the affected joint every three hours while awake for 30 minutes at a time, for at least the first 3-5 days, and then as needed for pain and swelling.  Continue to use ice for pain and swelling. You may notice swelling that will progress down to the foot and ankle.  This is normal after surgery.  Elevate your leg when you are not up walking on it.   o Continue to use the breathing machine you got in the hospital (incentive spirometer) which will help keep your temperature down.  It is common for your temperature to cycle up and down following surgery, especially at night when you are not up moving around and exerting yourself.  The breathing machine keeps your lungs expanded and your temperature down.   DIET:  As you were doing prior to hospitalization, we recommend a well-balanced diet.  DRESSING / WOUND CARE / SHOWERING  Keep the surgical dressing until follow up.  The dressing is water proof, so you can shower without any extra covering.  IF THE DRESSING FALLS OFF or the wound gets wet inside, change the dressing with sterile gauze.  Please use good hand washing techniques before changing the dressing.  Do not use any lotions or creams on the incision until instructed by your surgeon.    ACTIVITY  o Increase activity slowly as tolerated, but follow the weight bearing instructions below.   o No driving for 6 weeks or until further direction given by your physician.  You cannot drive while taking narcotics.  o No lifting or carrying greater than 10 lbs. until further directed by your surgeon. o Avoid periods of inactivity such as sitting longer than an hour when not asleep. This helps prevent blood clots.  o You may return to work once you are authorized by your doctor.     WEIGHT BEARING   Weight bearing as tolerated with assist  device (walker, cane, etc) as directed, use it as long as suggested by your surgeon or therapist, typically at least 4-6 weeks.   EXERCISES  Results after joint replacement surgery are often greatly improved when you follow the exercise, range of motion and muscle strengthening exercises prescribed by your doctor. Safety measures are also important to protect the joint from further injury. Any time any of these exercises cause you to have increased pain or swelling, decrease what you are doing until you are comfortable again and then slowly increase them. If you have problems or questions, call your caregiver or physical therapist for advice.   Rehabilitation is important following a joint replacement. After just a few days of immobilization, the muscles of the leg can become weakened and shrink (atrophy).  These exercises are designed to build up the tone and strength of the thigh and leg muscles and to improve motion. Often times heat used for twenty to thirty minutes before working out will loosen up your tissues and help with improving the range of motion but do not use heat for the first two weeks following surgery (sometimes heat can increase post-operative swelling).   These exercises can be done on a training (exercise) mat, on the floor, on a table or on a bed. Use whatever works the best and is most comfortable for you.    Use music or television while you are exercising so that   the exercises are a pleasant break in your day. This will make your life better with the exercises acting as a break in your routine that you can look forward to.   Perform all exercises about fifteen times, three times per day or as directed.  You should exercise both the operative leg and the other leg as well.  Exercises include:   . Quad Sets - Tighten up the muscle on the front of the thigh (Quad) and hold for 5-10 seconds.   . Straight Leg Raises - With your knee straight (if you were given a brace, keep it on),  lift the leg to 60 degrees, hold for 3 seconds, and slowly lower the leg.  Perform this exercise against resistance later as your leg gets stronger.  . Leg Slides: Lying on your back, slowly slide your foot toward your buttocks, bending your knee up off the floor (only go as far as is comfortable). Then slowly slide your foot back down until your leg is flat on the floor again.  . Angel Wings: Lying on your back spread your legs to the side as far apart as you can without causing discomfort.  . Hamstring Strength:  Lying on your back, push your heel against the floor with your leg straight by tightening up the muscles of your buttocks.  Repeat, but this time bend your knee to a comfortable angle, and push your heel against the floor.  You may put a pillow under the heel to make it more comfortable if necessary.   A rehabilitation program following joint replacement surgery can speed recovery and prevent re-injury in the future due to weakened muscles. Contact your doctor or a physical therapist for more information on knee rehabilitation.    CONSTIPATION  Constipation is defined medically as fewer than three stools per week and severe constipation as less than one stool per week.  Even if you have a regular bowel pattern at home, your normal regimen is likely to be disrupted due to multiple reasons following surgery.  Combination of anesthesia, postoperative narcotics, change in appetite and fluid intake all can affect your bowels.   YOU MUST use at least one of the following options; they are listed in order of increasing strength to get the job done.  They are all available over the counter, and you may need to use some, POSSIBLY even all of these options:    Drink plenty of fluids (prune juice may be helpful) and high fiber foods Colace 100 mg by mouth twice a day  Senokot for constipation as directed and as needed Dulcolax (bisacodyl), take with full glass of water  Miralax (polyethylene glycol)  once or twice a day as needed.  If you have tried all these things and are unable to have a bowel movement in the first 3-4 days after surgery call either your surgeon or your primary doctor.    If you experience loose stools or diarrhea, hold the medications until you stool forms back up.  If your symptoms do not get better within 1 week or if they get worse, check with your doctor.  If you experience "the worst abdominal pain ever" or develop nausea or vomiting, please contact the office immediately for further recommendations for treatment.   ITCHING:  If you experience itching with your medications, try taking only a single pain pill, or even half a pain pill at a time.  You can also use Benadryl over the counter for itching or also to   help with sleep.   TED HOSE STOCKINGS:  Use stockings on both legs until for at least 2 weeks or as directed by physician office. They may be removed at night for sleeping.  MEDICATIONS:  See your medication summary on the "After Visit Summary" that nursing will review with you.  You may have some home medications which will be placed on hold until you complete the course of blood thinner medication.  It is important for you to complete the blood thinner medication as prescribed.  PRECAUTIONS:  If you experience chest pain or shortness of breath - call 911 immediately for transfer to the hospital emergency department.   If you develop a fever greater that 101 F, purulent drainage from wound, increased redness or drainage from wound, foul odor from the wound/dressing, or calf pain - CONTACT YOUR SURGEON.                                                   FOLLOW-UP APPOINTMENTS:  If you do not already have a post-op appointment, please call the office for an appointment to be seen by your surgeon.  Guidelines for how soon to be seen are listed in your "After Visit Summary", but are typically between 1-4 weeks after surgery.  OTHER INSTRUCTIONS:   Knee  Replacement:  Do not place pillow under knee, focus on keeping the knee straight while resting. CPM instructions: 0-90 degrees, 2 hours in the morning, 2 hours in the afternoon, and 2 hours in the evening. Place foam block, curve side up under heel at all times except when in CPM or when walking.  DO NOT modify, tear, cut, or change the foam block in any way.   DENTAL ANTIBIOTICS:  In most cases prophylactic antibiotics for Dental procdeures after total joint surgery are not necessary.  Exceptions are as follows:  1. History of prior total joint infection  2. Severely immunocompromised (Organ Transplant, cancer chemotherapy, Rheumatoid biologic meds such as Humera)  3. Poorly controlled diabetes (A1C &gt; 8.0, blood glucose over 200)  If you have one of these conditions, contact your surgeon for an antibiotic prescription, prior to your dental procedure.   MAKE SURE YOU:  . Understand these instructions.  . Get help right away if you are not doing well or get worse.    Thank you for letting us be a part of your medical care team.  It is a privilege we respect greatly.  We hope these instructions will help you stay on track for a fast and full recovery!   Information on my medicine - ELIQUIS (apixaban)  This medication education was reviewed with me or my healthcare representative as part of my discharge preparation.  The pharmacist that spoke with me during my hospital stay was:   Why was Eliquis prescribed for you? Eliquis was prescribed for you to reduce the risk of blood clots forming after orthopedic surgery.    What do You need to know about Eliquis? Take your Eliquis TWICE DAILY - one tablet in the morning and one tablet in the evening with or without food.  It would be best to take the dose about the same time each day.  If you have difficulty swallowing the tablet whole please discuss with your pharmacist how to take the medication safely.  Take Eliquis exactly as  prescribed by   your doctor and DO NOT stop taking Eliquis without talking to the doctor who prescribed the medication.  Stopping without other medication to take the place of Eliquis may increase your risk of developing a clot.  After discharge, you should have regular check-up appointments with your healthcare provider that is prescribing your Eliquis.  What do you do if you miss a dose? If a dose of ELIQUIS is not taken at the scheduled time, take it as soon as possible on the same day and twice-daily administration should be resumed.  The dose should not be doubled to make up for a missed dose.  Do not take more than one tablet of ELIQUIS at the same time.  Important Safety Information A possible side effect of Eliquis is bleeding. You should call your healthcare provider right away if you experience any of the following: ? Bleeding from an injury or your nose that does not stop. ? Unusual colored urine (red or dark brown) or unusual colored stools (red or black). ? Unusual bruising for unknown reasons. ? A serious fall or if you hit your head (even if there is no bleeding).  Some medicines may interact with Eliquis and might increase your risk of bleeding or clotting while on Eliquis. To help avoid this, consult your healthcare provider or pharmacist prior to using any new prescription or non-prescription medications, including herbals, vitamins, non-steroidal anti-inflammatory drugs (NSAIDs) and supplements.  This website has more information on Eliquis (apixaban): http://www.eliquis.com/eliquis/home   

## 2020-03-12 ENCOUNTER — Telehealth: Payer: Self-pay

## 2020-03-12 ENCOUNTER — Encounter: Payer: Self-pay | Admitting: *Deleted

## 2020-03-12 ENCOUNTER — Encounter: Payer: Self-pay | Admitting: Hematology and Oncology

## 2020-03-12 DIAGNOSIS — E785 Hyperlipidemia, unspecified: Secondary | ICD-10-CM | POA: Diagnosis not present

## 2020-03-12 DIAGNOSIS — I10 Essential (primary) hypertension: Secondary | ICD-10-CM

## 2020-03-12 DIAGNOSIS — Z85038 Personal history of other malignant neoplasm of large intestine: Secondary | ICD-10-CM | POA: Diagnosis not present

## 2020-03-12 DIAGNOSIS — G4733 Obstructive sleep apnea (adult) (pediatric): Secondary | ICD-10-CM | POA: Diagnosis not present

## 2020-03-12 DIAGNOSIS — K2100 Gastro-esophageal reflux disease with esophagitis, without bleeding: Secondary | ICD-10-CM

## 2020-03-12 DIAGNOSIS — E663 Overweight: Secondary | ICD-10-CM | POA: Diagnosis present

## 2020-03-12 DIAGNOSIS — M1711 Unilateral primary osteoarthritis, right knee: Secondary | ICD-10-CM | POA: Diagnosis not present

## 2020-03-12 DIAGNOSIS — G6181 Chronic inflammatory demyelinating polyneuritis: Secondary | ICD-10-CM | POA: Diagnosis not present

## 2020-03-12 LAB — CBC
HCT: 31.3 % — ABNORMAL LOW (ref 39.0–52.0)
Hemoglobin: 10 g/dL — ABNORMAL LOW (ref 13.0–17.0)
MCH: 27.9 pg (ref 26.0–34.0)
MCHC: 31.9 g/dL (ref 30.0–36.0)
MCV: 87.2 fL (ref 80.0–100.0)
Platelets: 164 10*3/uL (ref 150–400)
RBC: 3.59 MIL/uL — ABNORMAL LOW (ref 4.22–5.81)
RDW: 23.3 % — ABNORMAL HIGH (ref 11.5–15.5)
WBC: 7.4 10*3/uL (ref 4.0–10.5)
nRBC: 0 % (ref 0.0–0.2)

## 2020-03-12 LAB — BASIC METABOLIC PANEL
Anion gap: 9 (ref 5–15)
BUN: 15 mg/dL (ref 8–23)
CO2: 25 mmol/L (ref 22–32)
Calcium: 9.5 mg/dL (ref 8.9–10.3)
Chloride: 100 mmol/L (ref 98–111)
Creatinine, Ser: 0.92 mg/dL (ref 0.61–1.24)
GFR calc Af Amer: >60 mL/min (ref >60)
GFR calc non Af Amer: >60 mL/min (ref >60)
Glucose, Bld: 142 mg/dL — ABNORMAL HIGH (ref 70–99)
Potassium: 3.6 mmol/L (ref 3.5–5.1)
Sodium: 134 mmol/L — ABNORMAL LOW (ref 135–145)

## 2020-03-12 MED ORDER — FERROUS SULFATE 325 (65 FE) MG PO TABS: 325.0000 mg | ORAL_TABLET | Freq: Three times a day (TID) | ORAL | 0 refills | Status: DC

## 2020-03-12 MED ORDER — HYDROCODONE-ACETAMINOPHEN 5-325 MG PO TABS: 1.0000 | ORAL_TABLET | ORAL | 0 refills | Status: DC | PRN

## 2020-03-12 MED ORDER — POLYETHYLENE GLYCOL 3350 17 G PO PACK: 17.0000 g | PACK | Freq: Two times a day (BID) | ORAL | 0 refills | Status: DC

## 2020-03-12 MED ORDER — DOCUSATE SODIUM 100 MG PO CAPS: 100.0000 mg | ORAL_CAPSULE | Freq: Two times a day (BID) | ORAL | 0 refills | Status: DC

## 2020-03-12 MED ORDER — METHOCARBAMOL 500 MG PO TABS: 500.0000 mg | ORAL_TABLET | Freq: Four times a day (QID) | ORAL | 0 refills | Status: DC | PRN

## 2020-03-12 NOTE — Plan of Care (Signed)
All discharge instructions were given to patient. All questions were answered.

## 2020-03-12 NOTE — Chronic Care Management (AMB) (Signed)
Chronic Care Management Pharmacy  Name: Derrick [name]  MRN: [id number] DOB: Oct 23, 1941  Chief Complaint/ HPI  Derrick [name],  79 y.o., male presents for their Initial CCM visit with the clinical pharmacist via telephone.  PCP : [name], MD  Their chronic conditions include: coronary artery disease, hypertension, A-fib, allergic rhinitis, sleep apnea, GERD, neuropathy, osteoarthritis, vitamin D deficiency, hyperlipidemia, BPH, iron deficiency anemia  Patient concerns: unable to tolerate ferrous sulfate due to GI distress, oncologist recommended he discontinue it following IV iron, next f/u is October 2021, surgeon recommended he restart, but pt states he doesn't plan to due to intolerance   Surgical History: TKA 03/11/20 [other surgeries this past year - CABG 05/2019, hernia surgery 01/2019, cataract surgery (10/20)]  Office Visits:  10/05/19: [name] - continue current meds   Consult Visit:  02/15/20: Colon cancer/IDA - proceed with IV iron, goal ferritin > 50, tolerates oral iron poorly, recheck CBC 1 month   11/09/19: BPH - from claims data  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 Miralax [Polyethylene Glycol]   . Nsaids    Medications: Outpatient Encounter Medications as of 03/13/2020  Medication Sig  . albuterol (VENTOLIN HFA) 108 (90 Base) MCG/ACT inhaler Inhale 2 puffs into the lungs every 6 (six) hours as needed for wheezing or shortness of breath.  [name] amLODipine (NORVASC) 5 MG tablet Take 5 mg by mouth daily.   [name] aspirin EC 81 MG tablet Take 1 tablet (81 mg total) by mouth daily.  . bimatoprost (LUMIGAN) 0.01 % SOLN Place 1 drop into both eyes at bedtime.  . Cholecalciferol 25 MCG (1000 UT) tablet Take 1,000 Units by mouth daily.   [name] docusate sodium (COLACE) 100 MG capsule Take 1 capsule (100 mg total) by  mouth 2 (two) times daily.  . dorzolamide-timolol (COSOPT) 22.3-6.8 MG/ML ophthalmic solution Place 1 drop into both eyes 2 (two) times daily.   [name] ELIQUIS 5 MG TABS tablet Take 1 tablet by mouth twice daily (Patient taking differently: Take 5 mg by mouth 2 (two) times daily. )  . ferrous sulfate (FERROUSUL) 325 (65 FE) MG tablet Take 1 tablet (325 mg total) by mouth 3 (three) times daily with meals for 14 days.  . finasteride (PROSCAR) 5 MG tablet Take 5 mg by mouth daily after breakfast.   . fluticasone (FLONASE) 50 MCG/ACT nasal spray Place 2 sprays into both nostrils 2 (two) times daily.  . Fluticasone Propionate, Inhal, (FLOVENT DISKUS) 100 MCG/BLIST AEPB Inhale 1 Inhaler into the lungs 2 (two) times daily. (Patient taking differently: Inhale 1 Inhaler into the lungs 2 (two) times daily as needed (shortness of breath/wheezing). )  . HYDROcodone-acetaminophen (NORCO) 5-325 MG tablet Take 1-2 tablets by mouth every 4 (four) hours as needed for moderate pain or severe pain.  . methocarbamol (ROBAXIN) 500 MG tablet Take 1 tablet (500 mg total) by mouth every 6 (six) hours as needed for muscle spasms.  . MULTAQ 400 MG tablet TAKE 1 TABLET BY MOUTH TWICE DAILY WITH MEALS (Patient taking differently: Take 400 mg by mouth 2 (two) times daily with a meal. )  . Multiple Vitamin (MULTIVITAMIN WITH MINERALS) TABS tablet Take 1 tablet by mouth daily.  . pantoprazole (PROTONIX) 40 MG tablet Take 1 tablet by mouth once daily (Patient taking differently: Take 40 mg by mouth daily. )  .  Polyethyl Glycol-Propyl Glycol (SYSTANE OP) Place 1 drop into both eyes daily as needed (dry eyes).  . polyethylene glycol (MIRALAX / GLYCOLAX) 17 g packet Take 17 g by mouth 2 (two) times daily.  . rosuvastatin (CRESTOR) 10 MG tablet Take 1 tablet (10 mg total) by mouth daily. (Patient taking differently: Take 10 mg by mouth at bedtime. )  . tadalafil (CIALIS) 5 MG tablet Take 5 mg by mouth at bedtime.   .  triamterene-hydrochlorothiazide (MAXZIDE-25) 37.5-25 MG tablet Take 1 tablet by mouth once daily (Patient taking differently: Take 1 tablet by mouth daily. )   Facility-Administered Encounter Medications as of 03/13/2020  Medication  . 0.9 %  sodium chloride infusion  . acetaminophen (TYLENOL) tablet 325-650 mg  . albuterol (PROVENTIL,VENTOLIN) solution continuous neb  . alum & mag hydroxide-simeth (MAALOX/MYLANTA) 200-200-20 MG/5ML suspension 15 mL  . amLODipine (NORVASC) tablet 5 mg  . apixaban (ELIQUIS) tablet 2.5 mg  . aspirin EC tablet 81 mg  . bisacodyl (DULCOLAX) suppository 10 mg  . budesonide (PULMICORT) nebulizer solution 0.25 mg  . celecoxib (CELEBREX) capsule 200 mg  . diphenhydrAMINE (BENADRYL) 12.5 MG/5ML elixir 12.5-25 mg  . docusate sodium (COLACE) capsule 100 mg  . dorzolamide-timolol (COSOPT) 22.3-6.8 MG/ML ophthalmic solution 1 drop  . dronedarone (MULTAQ) tablet 400 mg  . ferrous sulfate tablet 325 mg  . finasteride (PROSCAR) tablet 5 mg  . fluticasone (FLONASE) 50 MCG/ACT nasal spray 2 spray  . HYDROcodone-acetaminophen (NORCO) 7.5-325 MG per tablet 1-2 tablet  . HYDROcodone-acetaminophen (NORCO/VICODIN) 5-325 MG per tablet 1-2 tablet  . latanoprost (XALATAN) 0.005 % ophthalmic solution 1 drop  . magnesium citrate solution 1 Bottle  . menthol-cetylpyridinium (CEPACOL) lozenge 3 mg   Or  . phenol (CHLORASEPTIC) mouth spray 1 spray  . methocarbamol (ROBAXIN) tablet 500 mg   Or  . methocarbamol (ROBAXIN) 500 mg in dextrose 5 % 50 mL IVPB  . metoCLOPramide (REGLAN) tablet 5-10 mg   Or  . metoCLOPramide (REGLAN) injection 5-10 mg  . morphine 2 MG/ML injection 0.5-1 mg  . ondansetron (ZOFRAN) tablet 4 mg   Or  . ondansetron (ZOFRAN) injection 4 mg  . pantoprazole (PROTONIX) EC tablet 40 mg  . polyethylene glycol (MIRALAX / GLYCOLAX) packet 17 g  . polyvinyl alcohol (LIQUIFILM TEARS) 1.4 % ophthalmic solution 1 drop  . rosuvastatin (CRESTOR) tablet 10 mg  .  tadalafil (CIALIS) tablet 5 mg  . triamterene-hydrochlorothiazide (MAXZIDE-25) 37.5-25 MG per tablet 1 tablet    Current Diagnosis/Assessment: Goals    . DIET - INCREASE WATER INTAKE     Starting 06/13/2018, I will continue to drink at least 2 liters of water daily.     . Patient Stated     09/26/2019, Patient wants to focus on improving his energy level after just having heart surgery    . Pharmacy Care Plan     CARE PLAN ENTRY  Current Barriers:  . Chronic Disease Management support, education, and care coordination needs related to coronary artery disease, hypertension, A-fib, allergic rhinitis, sleep apnea, GERD, neuropathy, osteoarthritis, vitamin D deficiency, hyperlipidemia, BPH, iron deficiency anemia  Pharmacist Clinical Goal(s):  [name] Improve cholesterol to goal of LDL less than 70 mg/dL. Discuss increasing rosuvastatin from 10 mg to 20 mg with Dr. [name]. Repeat lipid panel within 12 weeks.   Interventions: . Comprehensive medication review performed.  Patient Self Care Activities:  . Self administers medications as prescribed  Initial goal documentation      Hypertension   CMP Latest Ref  Rng & Units 03/12/2020 03/04/2020 02/01/2020  Glucose 70 - 99 mg/dL 142(H) 85 77  BUN 8 - 23 mg/dL 15 18 19   Creatinine 0.61 - 1.24 mg/dL 0.92 0.99 1.03  Sodium 135 - 145 mmol/L 134(L) 138 139  Potassium 3.5 - 5.1 mmol/L 3.6 3.6 4.3  Chloride 98 - 111 mmol/L 100 103 103  CO2 22 - 32 mmol/L 25 25 27   Calcium 8.9 - 10.3 mg/dL 9.5 9.9 10.3  Total Protein 6.1 - 8.1 g/dL - - 6.9  Total Bilirubin 0.2 - 1.2 mg/dL - - 0.5  Alkaline Phos 39 - 117 IU/L - - -  AST 10 - 35 U/L - - 20  ALT 9 - 46 U/L - - 10   Office blood pressures are: BP Readings from Last 3 Encounters:  03/12/20 124/66  03/04/20 (!) 143/80  02/22/20 (!) 141/65   CPAP: wears nightly  BP goal < 140/90 mmHg Patient has failed these meds in the past: none Patient checks BP at home daily, before breakfast or  medication Patient home BP readings are ranging: 112-125/60-70 mmHg  Patient is currently controlled on the following medications:   Amlodipine 5 mg - 1 tablet daily  Triamterene/HCTZ 37.5-25 mg - 1 tablet daily   We discussed: notes mild edema, has improved since CABG, mainly around ankles before bed  Plan: Continue current medications  Hyperlipidemia/CAD   Lipid Panel     Component Value Date/Time   CHOL 155 01/22/2020 0731   TRIG 82 01/22/2020 0731   HDL 64 01/22/2020 0731   CHOLHDL 2.4 01/22/2020 0731   CHOLHDL 2.7 03/19/2016 0922   VLDL 14 03/19/2016 0922   LDLCALC 76 01/22/2020 0731   LABVLDL 15 01/22/2020 0731    The ASCVD Risk score (Villarreal., et al., 2013) failed to calculate for the following reasons:   The patient has a prior MI or stroke diagnosis   LDL goal < 70 CABG, stent placed 05/2019 Patient has failed these meds in past: atorvastatin (muscle weakness) Patient is currently uncontrolled on the following medications:   Rosuvastatin 10 mg - 1 tablet daily at bedtime   Aspirin 81 mg - 1 tablet daily  We discussed: switched from atorvastatin to rosuvastatin October 2020; discussed increasing rosuvastatin to 20 mg since he is tolerating it well, wants to increase today and follow up with cardiologist next week (Dr. [name], Richardson Dopp, PA)  Plan: Recommend increasing rosuvastatin from 10 mg to 20 mg daily. Repeat lipid panel in 12 weeks. Will consult with PCP.  AFIB   CMP Latest Ref Rng & Units 03/12/2020 03/04/2020 02/01/2020  Glucose 70 - 99 mg/dL 142(H) 85 77  BUN 8 - 23 mg/dL 15 18 19   Creatinine 0.61 - 1.24 mg/dL 0.92 0.99 1.03  Sodium 135 - 145 mmol/L 134(L) 138 139  Potassium 3.5 - 5.1 mmol/L 3.6 3.6 4.3  Chloride 98 - 111 mmol/L 100 103 103  CO2 22 - 32 mmol/L 25 25 27   Calcium 8.9 - 10.3 mg/dL 9.5 9.9 10.3  Total Protein 6.1 - 8.1 g/dL - - 6.9  Total Bilirubin 0.2 - 1.2 mg/dL - - 0.5  Alkaline Phos 39 - 117 IU/L - - -  AST 10 - 35  U/L - - 20  ALT 9 - 46 U/L - - 10   CBC Latest Ref Rng & Units 03/12/2020 03/07/2020 03/04/2020  WBC 4.0 - 10.5 K/uL 7.4 3.5(L) 4.1  Hemoglobin 13.0 - 17.0 g/dL 10.0(L) 11.1(L) 11.1(L)  Hematocrit 39.0 - 52.0 % 31.3(L) 35.7(L) 36.2(L)  Platelets 150 - 400 K/uL 164 178 185   Patient is currently rhythm controlled. 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  Patient has failed these meds in past: none  Patient is currently controlled on the following medications:   Eliquis 5 mg - 1 tablet BID  Multaq 400 mg - 1 tablet BID with meals   We discussed: Eliquis dose appropriate for renal function, resumed today due to recent TKA  Plan: Continue current medications  Iron Deficiency    Iron/TIBC/Ferritin/ %Sat    Component Value Date/Time   IRON 70 03/07/2020 0756   TIBC 394 03/07/2020 0756   FERRITIN 185 03/07/2020 0756   IRONPCTSAT 18 (L) 03/07/2020 0756   CBC Latest Ref Rng & Units 03/12/2020 03/07/2020 03/04/2020  WBC 4.0 - 10.5 K/uL 7.4 3.5(L) 4.1  Hemoglobin 13.0 - 17.0 g/dL 10.0(L) 11.1(L) 11.1(L)  Hematocrit 39.0 - 52.0 % 31.3(L) 35.7(L) 36.2(L)  Platelets 150 - 400 K/uL 164 178 185   Patient has failed these meds in past: none Patient is currently controlled on the following medications:   Ferrous sulfate 325 mg - 1 tablet TID with meals  Docusate 100 mg - 1 tablet BID (only for surgery recovery period - then will stop)  MiraLAX 17g - BID PRN (doesn't tolerate, GI distress)   We discussed: remove iron from medication list, feeling a lot better without oral iron, followed by oncology; surgery 3/16 resulted low in Hgb, but previously increased with IV iron; denies constipation   Plan: Continue current medications  GERD   Patient has failed these meds in past: previously on a different PPI but changed due to elevated WBC (2012) Patient is currently controlled on the following medications:   Pantoprazole 40 mg - 1 tablet daily 30-60 minutes before breakfast   We discussed: symptoms well  controlled, not interested in taper due to long history of GI symptoms   Plan: Continue current medications  BPH   Followed by urology  Patient has failed these meds in past: none Patient is currently controlled on the following medications:   Finasteride 5 mg - 1 tablet daily after breakfast  Tadalafil 5 mg - 1 tablet at bedtime   We discussed: symptoms well controlled  Plan: Continue current medications  Allergies   Patient has failed these meds in past: none Patient is currently controlled on the following medications:   Flonase 50 mcg - 2 sprays in each nostril daily  We discussed: uses for post-nasal drip, helps a lot  Plan: Continue current medications  Osteoarthritis/Neuropathy   Patient has failed these meds in past: gabapentin - discontinued due to memory loss concerns  Patient is currently controlled on the following medications:   Norco 5-325 mg - 1-2 tablets every 4 hours PRN   Methocarbamol 500 mg - 1 tablet every 6 hours PRN  Tylenol 325 mg - 1-2 tablets every 4-6 hours PRN  We discussed: Taking 1 Norco every 4 hours, will take 2 if severe pain; Takes methocarbamol most mornings prior to physical therapy exercises (both are only for surgery recovery, no refills); Avoids NSAIDs due to Eliquis + aspirin   Plan: Continue current medications   Pulmonary Sarcoidosis (Per patient)   Patient has failed these meds in past: none Patient is currently controlled on the following medications:   Albuterol inhaler - PRN (uses about once a month or less, usually with allergies or infection)  Fluticasone 100 mcg inhaler - 1 puff BID (only  uses PRN when he has a respiratory infection)  Plan: Continue current medications   Cataracts/Glaucoma (Per opthalmology)   Patient is currently controlled on the following medications:   Lumigan - 1 drop in each eye at bedtime  Dorzolamide/timolol -1 drop in each eye BID   Systane eye drops - PRN  Plan: Continue  current medications  Vaccines   Reviewed and discussed patient's vaccination history.    Immunization History  Administered Date(s) Administered  . Fluad Quad(high Dose 65+) 08/14/2019  . Influenza Split 09/26/2012  . Influenza, High Dose Seasonal PF 09/02/2015, 08/24/2016, 09/04/2018  . Influenza,inj,Quad PF,6+ Mos 09/25/2013, 09/12/2014  . Influenza-Unspecified 09/15/2017, 09/05/2018  . PFIZER SARS-COV-2 Vaccination 01/11/2020, 02/01/2020  . Pneumococcal Conjugate-13 12/13/2013  . Pneumococcal Polysaccharide-23 02/09/2016  . Tdap 01/13/2016  . Zoster Recombinat (Shingrix) 12/21/2017   Plan: Vaccines up to date, no recommendations.   Medication Management  OTCs: Multivitamin Senior, vitamin D3 1000 IU daily  Pharmacy/Benefits: [facility]/Medicare Part D  Adherence: uses pillbox   Affordability: denies concerns   CCM Follow Up: 06/12/20 at 10:45 AM (telephone)  Debbora Dus, PharmD Clinical Pharmacist [facility] at [facility] [phone]

## 2020-03-12 NOTE — Progress Notes (Signed)
     Subjective: 1 Day Post-Op Procedure(s) (LRB): TOTAL KNEE ARTHROPLASTY (Right)   Patient reports pain as mild.   Patient's anticipated LOS is less than 2 midnights, meeting these requirements: - Lives within 1 hour of care - Has a competent adult at home to recover with post-op recover - NO history of  - Chronic pain requiring opiods  - Diabetes  - Heart attack  - Stroke  - DVT/VTE  - Respiratory Failure/COPD  - Renal failure  - Anemia  - Advanced Liver disease       Objective:   VITALS:   Vitals:   03/12/20 0142 03/12/20 0538  BP: 125/65 124/66  Pulse: 60 60  Resp: 16 16  Temp: 98.2 F (36.8 C) 98.4 F (36.9 C)  SpO2: 97% 99%    Dorsiflexion/Plantar flexion intact Incision: dressing C/D/I No cellulitis present Compartment soft  LABS Recent Labs    03/12/20 0345  HGB 10.0*  HCT 31.3*  WBC 7.4  PLT 164    Recent Labs    03/12/20 0345  NA 134*  K 3.6  BUN 15  CREATININE 0.92  GLUCOSE 142*     Assessment/Plan: 1 Day Post-Op Procedure(s) (LRB): TOTAL KNEE ARTHROPLASTY (Right) Foley cath DC'd Advance diet Up with therapy D/C IV fluids Discharge home  Follow up in 2 weeks at [facility] Follow up with OLIN,Cynethia Schindler D in 2 weeks.  Contact information:  [facility] [address] [place] [id number]    Overweight (BMI 25-29.9) Estimated body mass index is 27.18 kg/m as calculated from the following:   Height as of this encounter: 5\' 9"  (1.753 m).   Weight as of this encounter: 83.5 kg. Patient also counseled that weight may inhibit the healing process Patient counseled that losing weight will help with future health issues         Danae Orleans PA-C  [facility]  [place] [address], [address], [place], [place] [address] Phone: [phone] [email or web address] Facebook  [employer]

## 2020-03-12 NOTE — Telephone Encounter (Signed)
I would like to request a referral for Katie Schowalter to chronic care management pharmacy services for the following conditions:   Essential hypertension, benign  [I10]  GERD [K21.9]  Debbora Dus, PharmD Clinical Pharmacist [facility] at [facility] [phone]

## 2020-03-12 NOTE — Progress Notes (Signed)
Physical Therapy Treatment Patient Details Name: Derrick [name] MRN: [id number] DOB: 1941-11-11 Today's Date: 03/12/2020    History of Present Illness Pt is a 79 year old male s/p Right TKA    PT Comments    Pt ambulated in hallway and practiced safe stair technique.  Pt also performed LE exercises and provided with HEP handout.  Pt feels ready for d/c home today.    Follow Up Recommendations  Follow surgeon's recommendation for DC plan and follow-up therapies     Equipment Recommendations  Rolling walker with 5" wheels;3in1 (PT)    Recommendations for Other Services       Precautions / Restrictions Precautions Precautions: Knee;Fall Restrictions Weight Bearing Restrictions: No    Mobility  Bed Mobility Overal bed mobility: Needs Assistance Bed Mobility: Supine to Sit     Supine to sit: Min guard;HOB elevated     General bed mobility comments: pt performed self assist for R LE, cues for technique  Transfers Overall transfer level: Needs assistance Equipment used: Rolling walker (2 wheeled) Transfers: Sit to/from Stand Sit to Stand: Min guard         General transfer comment: verbal cues for UE and LE positioning  Ambulation/Gait Ambulation/Gait assistance: Min guard Gait Distance (Feet): 120 Feet Assistive device: Rolling walker (2 wheeled) Gait Pattern/deviations: Step-through pattern;Decreased stride length;Antalgic     General Gait Details: verbal cues for sequence, RW positioning, posture, step length   Stairs Stairs: Yes Stairs assistance: Min guard Stair Management: Step to pattern;Forwards;One rail Left Number of Stairs: 2 General stair comments: verbal cues for sequence and safety; pt performed twice and reports understanding   Wheelchair Mobility    Modified Rankin (Stroke Patients Only)       Balance                                            Cognition Arousal/Alertness: Awake/alert Behavior During  Therapy: WFL for tasks assessed/performed Overall Cognitive Status: Within Functional Limits for tasks assessed                                        Exercises Total Joint Exercises Ankle Circles/Pumps: AROM;Both;10 reps Quad Sets: AROM;10 reps;Right Short Arc Quad: Right;AAROM;10 reps Heel Slides: AAROM;Right;10 reps Hip ABduction/ADduction: AAROM;Right;10 reps Straight Leg Raises: AAROM;Right;10 reps    General Comments        Pertinent Vitals/Pain Pain Assessment: 0-10 Pain Score: 4  Pain Location: right knee Pain Descriptors / Indicators: Sore;Aching Pain Intervention(s): Premedicated before session;Repositioned;Monitored during session    Home Living                      Prior Function            PT Goals (current goals can now be found in the care plan section) Progress towards PT goals: Progressing toward goals    Frequency    7X/week      PT Plan Current plan remains appropriate    Co-evaluation              AM-PAC PT "6 Clicks" Mobility   Outcome Measure  Help needed turning from your back to your side while in a flat bed without using bedrails?: A Little Help needed moving from lying on your  back to sitting on the side of a flat bed without using bedrails?: A Little Help needed moving to and from a bed to a chair (including a wheelchair)?: A Little Help needed standing up from a chair using your arms (e.g., wheelchair or bedside chair)?: A Little Help needed to walk in hospital room?: A Little Help needed climbing 3-5 steps with a railing? : A Little 6 Click Score: 18    End of Session Equipment Utilized During Treatment: Gait belt Activity Tolerance: Patient tolerated treatment well Patient left: in chair;with call bell/phone within reach;with chair alarm set Nurse Communication: Mobility status PT Visit Diagnosis: Other abnormalities of gait and mobility (R26.89)     Time: [id number] PT Time Calculation  (min) (ACUTE ONLY): 24 min  Charges:  $Gait Training: 8-22 mins $Therapeutic Exercise: 8-22 mins                     {Kati PT, DPT Acute Rehabilitation Services Office: [phone]   Derrick [name],KATHrine E 03/12/2020, 4:00 PM

## 2020-03-13 ENCOUNTER — Other Ambulatory Visit: Payer: Self-pay

## 2020-03-13 ENCOUNTER — Other Ambulatory Visit: Payer: Self-pay | Admitting: Hematology and Oncology

## 2020-03-13 ENCOUNTER — Telehealth: Payer: Self-pay | Admitting: Hematology and Oncology

## 2020-03-13 ENCOUNTER — Ambulatory Visit: Payer: Medicare Other

## 2020-03-13 DIAGNOSIS — Z9989 Dependence on other enabling machines and devices: Secondary | ICD-10-CM

## 2020-03-13 DIAGNOSIS — I25119 Atherosclerotic heart disease of native coronary artery with unspecified angina pectoris: Secondary | ICD-10-CM

## 2020-03-13 DIAGNOSIS — K2100 Gastro-esophageal reflux disease with esophagitis, without bleeding: Secondary | ICD-10-CM

## 2020-03-13 DIAGNOSIS — E785 Hyperlipidemia, unspecified: Secondary | ICD-10-CM

## 2020-03-13 DIAGNOSIS — I480 Paroxysmal atrial fibrillation: Secondary | ICD-10-CM

## 2020-03-13 DIAGNOSIS — G4733 Obstructive sleep apnea (adult) (pediatric): Secondary | ICD-10-CM

## 2020-03-13 DIAGNOSIS — D508 Other iron deficiency anemias: Secondary | ICD-10-CM

## 2020-03-13 DIAGNOSIS — I10 Essential (primary) hypertension: Secondary | ICD-10-CM

## 2020-03-13 DIAGNOSIS — M19041 Primary osteoarthritis, right hand: Secondary | ICD-10-CM

## 2020-03-13 DIAGNOSIS — J301 Allergic rhinitis due to pollen: Secondary | ICD-10-CM

## 2020-03-13 DIAGNOSIS — E559 Vitamin D deficiency, unspecified: Secondary | ICD-10-CM

## 2020-03-13 DIAGNOSIS — N401 Enlarged prostate with lower urinary tract symptoms: Secondary | ICD-10-CM

## 2020-03-13 DIAGNOSIS — D472 Monoclonal gammopathy: Secondary | ICD-10-CM

## 2020-03-13 DIAGNOSIS — M19042 Primary osteoarthritis, left hand: Secondary | ICD-10-CM

## 2020-03-13 DIAGNOSIS — G63 Polyneuropathy in diseases classified elsewhere: Secondary | ICD-10-CM

## 2020-03-13 MED ORDER — ROSUVASTATIN CALCIUM 20 MG PO TABS: 20.0000 mg | ORAL_TABLET | Freq: Every day | ORAL | 11 refills | Status: DC

## 2020-03-13 NOTE — Telephone Encounter (Signed)
Please sign referral

## 2020-03-13 NOTE — Discharge Summary (Signed)
Physician Discharge Summary  Patient ID: Derrick [name] MRN: [id number] DOB/AGE: 1941-02-11 79 y.o.  Admit date: 03/11/2020 Discharge date: 03/12/2020   Procedures:  Procedure(s) (LRB): TOTAL KNEE ARTHROPLASTY (Right)  Attending Physician:  Dr. [name]   Admission Diagnoses:   Right knee primary OA / pain  Discharge Diagnoses:  Principal Problem:   S/P right TKA Active Problems:   Status post total knee replacement, right   Overweight (BMI 25.0-29.9)  Past Medical History:  Diagnosis Date  . Anemia    iron deficiency anemia- infusion on 02/15/2020 and 02/22/2020  . BENIGN PROSTATIC HYPERTROPHY, WITH OBSTRUCTION 05/28/2010  . CAD (coronary artery disease)    a. s/p NSTEMI 06/01/11: DES to RCA;  b. cath 06/25/11:   dLM 50-60% (FFR 0.87), prox to mid LAD 40-50%, D1 50%, pCFX 50%, RCA stent ok, dPDA 80%, EF 55-60%.  His FFR was felt to be negative and therefore medical therapy was recommended ;  echo 6/12: EF 55-60%, mild AS   . Chronic back pain   . CIDP (chronic inflammatory demyelinating polyneuropathy) ([place]) 04/11/2012  . Colon cancer ([place]) dx'd 2000   "left"  . COLONIC POLYPS, ADENOMATOUS, HX OF 05/28/2010  . Complication of anesthesia    "stopped breathing; related to my sleep apnea" & urinary retention   . COPD (chronic obstructive pulmonary disease) ([place])    "associated w/lung sarcoidosis"  . 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 DISH (diffuse idiopathic skeletal hyperostosis) 12/02/2015  . Diverticulitis   . GENERALIZED OSTEOARTHROSIS UNSPECIFIED SITE 05/28/2010  . GERD 05/28/2010  . Heart murmur   . History of gout   . History of kidney stones   . HYPERLIPIDEMIA 05/28/2010  . Hypertension   . IgM lambda paraproteinemia   . 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 Malignant neoplasm of descending colon ([place]) 05/28/2010  . MITRAL VALVE PROLAPSE 05/28/2010  . 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 NSTEMI  (non-ST elevated myocardial infarction) ([place]) 06/03/11  . 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  . OSA on CPAP 05/28/2010  . PALPITATIONS, CHRONIC 05/28/2010  . Parotid gland pain 2010   infection  . Peripheral neuropathy    "tx'd w/targeted chemo" (05/12/2016)  . Pneumonia 2-3 times  . Presence of permanent cardiac pacemaker    [employer]  . 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 Sarcoidosis    pulmonalis  . UNSPECIFIED INFLAMMATORY AND TOXIC NEUROPATHY 05/28/2010  . Vision loss   . VITAMIN D DEFICIENCY 05/28/2010    HPI:    Derrick [name], 79 y.o. male, has a history of pain and functional disability in the right knee due to arthritis and has failed non-surgical conservative treatments for greater than 12 weeks to include NSAID's and/or analgesics, corticosteriod injections and activity modification.  Onset of symptoms was gradual, starting 15 years ago with gradually worsening course since that time. The patient noted prior procedures on the knee to include  arthroscopy on the right knee(s).  Patient currently rates pain in the right knee(s) at 10 out of 10 with activity. Patient has night pain, worsening of pain with activity and weight bearing, pain that interferes with activities of daily living, pain with passive range of motion, crepitus and joint swelling.  Patient has evidence of periarticular osteophytes and joint space narrowing by imaging studies.  There is no active infection.  Risks, benefits and expectations were discussed with the patient.  Risks including but not limited to the risk of anesthesia, blood clots,  nerve damage, blood vessel damage, failure of the prosthesis, infection and up to and including death.  Patient understand the risks, benefits and expectations and wishes to proceed with surgery.   PCP: [name], MD   Discharged Condition: good  Hospital Course:  Patient underwent the above stated procedure on 03/11/2020. Patient tolerated the procedure well and brought to  the recovery room in good condition and subsequently to the floor.  POD #1 BP: 124/66 ; Pulse: 60 ; Temp: 98.4 F (36.9 C) ; Resp: 16 Patient reports pain as mild, pain controlled.  No reported events throughout the night.  Feels that he is already able to decrease his analgesic medication. Discussed the procedure and expectations moving forward.  Ready to be discharged home.  LABS  Basename    HGB     10.0  HCT     31.3    Discharge Exam: General appearance: alert, cooperative and no distress Extremities: Homans sign is negative, no sign of DVT, no edema, redness or tenderness in the calves or thighs and no ulcers, gangrene or trophic changes  Disposition: Home with follow up in 2 weeks   Follow-up Information    [name], MD. Go on 03/26/2020.   Specialty: Orthopedic Surgery Why: You are scheduled for a post-operative appointment on 03-26-20 at 9:15 am.  Contact information: [address] [place] [address] [id number]        Rosilyn Mings.. Go on 03/14/2020.   Why: You are scheduled for a physical therapy appointment on 03-14-20 at 10:00 am.  Contact information: [place] [address] [phone]           Discharge Instructions    Call MD / Call 911   Complete by: As directed    If you experience chest pain or shortness of breath, CALL 911 and be transported to the hospital emergency room.  If you develope a fever above 101 F, pus (white drainage) or increased drainage or redness at the wound, or calf pain, call your surgeon's office.   Change dressing   Complete by: As directed    Maintain surgical dressing until follow up in the clinic. If the edges start to pull up, may reinforce with tape. If the dressing is no longer working, may remove and cover with gauze and tape, but must keep the area dry and clean.  Call with any questions or concerns.   Change dressing   Complete by: As directed    Maintain  surgical dressing until follow up in the clinic. If the edges start to pull up, may reinforce with tape. If the dressing is no longer working, may remove and cover with gauze and tape, but must keep the area dry and clean.  Call with any questions or concerns.   Constipation Prevention   Complete by: As directed    Drink plenty of fluids.  Prune juice may be helpful.  You may use a stool softener, such as Colace (over the counter) 100 mg twice a day.  Use MiraLax (over the counter) for constipation as needed.   Diet - low sodium heart healthy   Complete by: As directed    Discharge instructions   Complete by: As directed    Maintain surgical dressing until follow up in the clinic. If the edges start to pull up, may reinforce with tape. If the dressing is no longer working, may remove and cover with gauze and tape, but must  keep the area dry and clean.  Follow up in 2 weeks at [facility]. Call with any questions or concerns.   Increase activity slowly as tolerated   Complete by: As directed    Weight bearing as tolerated with assist device (walker, cane, etc) as directed, use it as long as suggested by your surgeon or therapist, typically at least 4-6 weeks.   TED hose   Complete by: As directed    Use stockings (TED hose) for 2 weeks on both leg(s).  You may remove them at night for sleeping.   TED hose   Complete by: As directed    Use stockings (TED hose) for 2 weeks on both leg(s).  You may remove them at night for sleeping.      Allergies as of 03/12/2020      Reactions   Miralax [polyethylene Glycol]    Nsaids       Medication List    TAKE these medications   albuterol 108 (90 Base) MCG/ACT inhaler Commonly known as: VENTOLIN HFA Inhale 2 puffs into the lungs every 6 (six) hours as needed for wheezing or shortness of breath.   amLODipine 5 MG tablet Commonly known as: NORVASC Take 5 mg by mouth daily.   aspirin EC 81 MG tablet Take 1 tablet (81 mg total) by mouth daily.     bimatoprost 0.01 % Soln Commonly known as: LUMIGAN Place 1 drop into both eyes at bedtime.   Cholecalciferol 25 MCG (1000 UT) tablet Take 1,000 Units by mouth daily.   docusate sodium 100 MG capsule Commonly known as: Colace Take 1 capsule (100 mg total) by mouth 2 (two) times daily.   dorzolamide-timolol 22.3-6.8 MG/ML ophthalmic solution Commonly known as: COSOPT Place 1 drop into both eyes 2 (two) times daily.   Eliquis 5 MG Tabs tablet Generic drug: apixaban Take 1 tablet by mouth twice daily   ferrous sulfate 325 (65 FE) MG tablet Commonly known as: FerrouSul Take 1 tablet (325 mg total) by mouth 3 (three) times daily with meals for 14 days.   finasteride 5 MG tablet Commonly known as: PROSCAR Take 5 mg by mouth daily after breakfast.   Flovent Diskus 100 MCG/BLIST Aepb Generic drug: Fluticasone Propionate (Inhal) Inhale 1 Inhaler into the lungs 2 (two) times daily. What changed:   when to take this  reasons to take this   fluticasone 50 MCG/ACT nasal spray Commonly known as: FLONASE Place 2 sprays into both nostrils 2 (two) times daily.   HYDROcodone-acetaminophen 5-325 MG tablet Commonly known as: Norco Take 1-2 tablets by mouth every 4 (four) hours as needed for moderate pain or severe pain.   methocarbamol 500 MG tablet Commonly known as: Robaxin Take 1 tablet (500 mg total) by mouth every 6 (six) hours as needed for muscle spasms.   Multaq 400 MG tablet Generic drug: dronedarone TAKE 1 TABLET BY MOUTH TWICE DAILY WITH MEALS   multivitamin with minerals Tabs tablet Take 1 tablet by mouth daily.   pantoprazole 40 MG tablet Commonly known as: PROTONIX Take 1 tablet by mouth once daily   polyethylene glycol 17 g packet Commonly known as: MIRALAX / GLYCOLAX Take 17 g by mouth 2 (two) times daily.   rosuvastatin 10 MG tablet Commonly known as: CRESTOR Take 1 tablet (10 mg total) by mouth daily. What changed: when to take this   SYSTANE  OP Place 1 drop into both eyes daily as needed (dry eyes).   tadalafil 5 MG tablet Commonly  known as: CIALIS Take 5 mg by mouth at bedtime.   triamterene-hydrochlorothiazide 37.5-25 MG tablet Commonly known as: MAXZIDE-25 Take 1 tablet by mouth once daily            Discharge Care Instructions  (From admission, onward)         Start     Ordered   03/12/20 0000  Change dressing    Comments: Maintain surgical dressing until follow up in the clinic. If the edges start to pull up, may reinforce with tape. If the dressing is no longer working, may remove and cover with gauze and tape, but must keep the area dry and clean.  Call with any questions or concerns.   03/12/20 0834   03/12/20 0000  Change dressing    Comments: Maintain surgical dressing until follow up in the clinic. If the edges start to pull up, may reinforce with tape. If the dressing is no longer working, may remove and cover with gauze and tape, but must keep the area dry and clean.  Call with any questions or concerns.   03/12/20 [id number]           Signed: [place] Pugh. Dana Dorner   PA-C  03/13/2020, 8:44 AM

## 2020-03-13 NOTE — Addendum Note (Signed)
Addended by: [name] E on: 03/13/2020 02:31 PM   Modules accepted: Orders

## 2020-03-13 NOTE — Telephone Encounter (Signed)
Scheduled per 3/18 sch msg. Called and spoke with pt, confirmed 9/7 and 9/14 appts

## 2020-03-13 NOTE — Patient Instructions (Addendum)
March 13, 2020  Dear Derrick [name],  It was a pleasure meeting you during our initial appointment on March 13, 2020. Below is a summary of the goals we discussed and components of chronic care management. Please contact me anytime with questions or concerns.   Visit Information  Goals Addressed            This Visit's Progress   . Pharmacy Care Plan       CARE PLAN ENTRY  Current Barriers:  . Chronic Disease Management support, education, and care coordination needs related to coronary artery disease, hypertension, A-fib, allergic rhinitis, sleep apnea, GERD, neuropathy, osteoarthritis, vitamin D deficiency, hyperlipidemia, BPH, iron deficiency anemia  Pharmacist Clinical Goal(s):  [name] Improve cholesterol to goal of LDL less than 70 mg/dL. Discuss increasing rosuvastatin from 10 mg to 20 mg with Dr. [name]. Repeat lipid panel within 12 weeks.   Interventions: . Comprehensive medication review performed.  Patient Self Care Activities:  . Self administers medications as prescribed  Initial goal documentation       Derrick [name] was given information about Chronic Care Management services today including:  1. CCM service includes personalized support from designated clinical staff supervised by his physician, including individualized plan of care and coordination with other care providers 2. 24/7 contact phone numbers for assistance for urgent and routine care needs. 3. Standard insurance, coinsurance, copays and deductibles apply for chronic care management only during months in which we provide at least 20 minutes of these services. Most insurances cover these services at 100%, however patients may be responsible for any copay, coinsurance and/or deductible if applicable. This service may help you avoid the need for more expensive face-to-face services. 4. Only one practitioner may furnish and bill the service in a calendar month. 5. The patient may stop CCM services at any time  (effective at the end of the month) by phone call to the office staff.  Patient agreed to services and verbal consent obtained.   The patient verbalized understanding of instructions provided today and agreed to receive a mailed copy of patient instruction and/or educational materials. Telephone follow up appointment with pharmacy team member scheduled for: 06/12/20 at 10:45 AM (telephone)  Debbora Dus, PharmD Clinical Pharmacist [facility] [phone]   [place] stands for "Dietary Approaches to Stop Hypertension." The DASH eating plan is a healthy eating plan that has been shown to reduce high blood pressure (hypertension). It may also reduce your risk for type 2 diabetes, heart disease, and stroke. The DASH eating plan may also help with weight loss. What are tips for following this plan?  General guidelines  Avoid eating more than 2,300 mg (milligrams) of salt (sodium) a day. If you have hypertension, you may need to reduce your sodium intake to 1,500 mg a day.  Limit alcohol intake to no more than 1 drink a day for nonpregnant women and 2 drinks a day for men. One drink equals 12 oz of beer, 5 oz of wine, or 1 oz of hard liquor.  Work with your health care provider to maintain a healthy body weight or to lose weight. Ask what an ideal weight is for you.  Get at least 30 minutes of exercise that causes your heart to beat faster (aerobic exercise) most days of the week. Activities may include walking, swimming, or biking.  Work with your health care provider or diet and nutrition specialist (dietitian) to adjust your eating plan to your  individual calorie needs. Reading food labels   Check food labels for the amount of sodium per serving. Choose foods with less than 5 percent of the Daily Value of sodium. Generally, foods with less than 300 mg of sodium per serving fit into this eating plan.  To find whole grains, look for the word  "whole" as the first word in the ingredient list. Shopping  Buy products labeled as "low-sodium" or "no salt added."  Buy fresh foods. Avoid canned foods and premade or frozen meals. Cooking  Avoid adding salt when cooking. Use salt-free seasonings or herbs instead of table salt or sea salt. Check with your health care provider or pharmacist before using salt substitutes.  Do not fry foods. Cook foods using healthy methods such as baking, boiling, grilling, and broiling instead.  Cook with heart-healthy oils, such as olive, canola, soybean, or sunflower oil. Meal planning  Eat a balanced diet that includes: ? 5 or more servings of fruits and vegetables each day. At each meal, try to fill half of your plate with fruits and vegetables. ? Up to 6-8 servings of whole grains each day. ? Less than 6 oz of lean meat, poultry, or fish each day. A 3-oz serving of meat is about the same size as a deck of cards. One egg equals 1 oz. ? 2 servings of low-fat dairy each day. ? A serving of nuts, seeds, or beans 5 times each week. ? Heart-healthy fats. Healthy fats called Omega-3 fatty acids are found in foods such as flaxseeds and coldwater fish, like sardines, salmon, and mackerel.  Limit how much you eat of the following: ? Canned or prepackaged foods. ? Food that is high in trans fat, such as fried foods. ? Food that is high in saturated fat, such as fatty meat. ? Sweets, desserts, sugary drinks, and other foods with added sugar. ? Full-fat dairy products.  Do not salt foods before eating.  Try to eat at least 2 vegetarian meals each week.  Eat more home-cooked food and less restaurant, buffet, and fast food.  When eating at a restaurant, ask that your food be prepared with less salt or no salt, if possible. What foods are recommended? The items listed may not be a complete list. Talk with your dietitian about what dietary choices are best for you. Grains Whole-grain or whole-wheat  bread. Whole-grain or whole-wheat pasta. Brown rice. [place]. Bulgur. Whole-grain and low-sodium cereals. Pita bread. Low-fat, low-sodium crackers. Whole-wheat flour tortillas. Vegetables Fresh or frozen vegetables (raw, steamed, roasted, or grilled). Low-sodium or reduced-sodium tomato and vegetable juice. Low-sodium or reduced-sodium tomato sauce and tomato paste. Low-sodium or reduced-sodium canned vegetables. Fruits All fresh, dried, or frozen fruit. Canned fruit in natural juice (without added sugar). Meat and other protein foods Skinless chicken or [place]. Ground chicken or [place]. Pork with fat trimmed off. Fish and seafood. Egg whites. Dried beans, peas, or lentils. Unsalted nuts, nut butters, and seeds. Unsalted canned beans. Lean cuts of beef with fat trimmed off. Low-sodium, lean deli meat. Dairy Low-fat (1%) or fat-free (skim) milk. Fat-free, low-fat, or reduced-fat cheeses. Nonfat, low-sodium ricotta or cottage cheese. Low-fat or nonfat yogurt. Low-fat, low-sodium cheese. Fats and oils Soft margarine without trans fats. Vegetable oil. Low-fat, reduced-fat, or light mayonnaise and salad dressings (reduced-sodium). Canola, safflower, olive, soybean, and sunflower oils. Avocado. Seasoning and other foods Herbs. Spices. Seasoning mixes without salt. Unsalted popcorn and pretzels. Fat-free sweets. What foods are not recommended? The items listed may not be  a complete list. Talk with your dietitian about what dietary choices are best for you. Grains Baked goods made with fat, such as croissants, muffins, or some breads. Dry pasta or rice meal packs. Vegetables Creamed or fried vegetables. Vegetables in a cheese sauce. Regular canned vegetables (not low-sodium or reduced-sodium). Regular canned tomato sauce and paste (not low-sodium or reduced-sodium). Regular tomato and vegetable juice (not low-sodium or reduced-sodium). Angie Fava. Olives. Fruits Canned fruit in a light or heavy  syrup. Fried fruit. Fruit in cream or butter sauce. Meat and other protein foods Fatty cuts of meat. Ribs. Fried meat. Berniece Salines. Sausage. Bologna and other processed lunch meats. Salami. Fatback. Hotdogs. Bratwurst. Salted nuts and seeds. Canned beans with added salt. Canned or smoked fish. Whole eggs or egg yolks. Chicken or [place] with skin. Dairy Whole or 2% milk, cream, and half-and-half. Whole or full-fat cream cheese. Whole-fat or sweetened yogurt. Full-fat cheese. Nondairy creamers. Whipped toppings. Processed cheese and cheese spreads. Fats and oils Butter. Stick margarine. Lard. Shortening. Ghee. Bacon fat. Tropical oils, such as coconut, palm kernel, or palm oil. Seasoning and other foods Salted popcorn and pretzels. Onion salt, garlic salt, seasoned salt, table salt, and sea salt. Worcestershire sauce. Tartar sauce. Barbecue sauce. Teriyaki sauce. Soy sauce, including reduced-sodium. Steak sauce. Canned and packaged gravies. Fish sauce. Oyster sauce. Cocktail sauce. Horseradish that you find on the shelf. Ketchup. Mustard. Meat flavorings and tenderizers. Bouillon cubes. Hot sauce and Tabasco sauce. Premade or packaged marinades. Premade or packaged taco seasonings. Relishes. Regular salad dressings. Where to find more information:  National Heart, Lung, and [place]: https://wilson-eaton.com/  American Heart Association: www.heart.org Summary  The DASH eating plan is a healthy eating plan that has been shown to reduce high blood pressure (hypertension). It may also reduce your risk for type 2 diabetes, heart disease, and stroke.  With the DASH eating plan, you should limit salt (sodium) intake to 2,300 mg a day. If you have hypertension, you may need to reduce your sodium intake to 1,500 mg a day.  When on the DASH eating plan, aim to eat more fresh fruits and vegetables, whole grains, lean proteins, low-fat dairy, and heart-healthy fats.  Work with your health care provider or diet and  nutrition specialist (dietitian) to adjust your eating plan to your individual calorie needs. This information is not intended to replace advice given to you by your health care provider. Make sure you discuss any questions you have with your health care provider. Document Revised: 11/25/2017 Document Reviewed: 12/06/2016 Elsevier Patient Education  2020 [employer].

## 2020-03-14 DIAGNOSIS — M25661 Stiffness of right knee, not elsewhere classified: Secondary | ICD-10-CM | POA: Diagnosis not present

## 2020-03-14 DIAGNOSIS — M25561 Pain in right knee: Secondary | ICD-10-CM | POA: Diagnosis not present

## 2020-03-18 DIAGNOSIS — M25561 Pain in right knee: Secondary | ICD-10-CM | POA: Diagnosis not present

## 2020-03-18 DIAGNOSIS — M25661 Stiffness of right knee, not elsewhere classified: Secondary | ICD-10-CM | POA: Diagnosis not present

## 2020-03-20 DIAGNOSIS — M25561 Pain in right knee: Secondary | ICD-10-CM | POA: Diagnosis not present

## 2020-03-20 DIAGNOSIS — M25661 Stiffness of right knee, not elsewhere classified: Secondary | ICD-10-CM | POA: Diagnosis not present

## 2020-03-21 ENCOUNTER — Other Ambulatory Visit: Payer: Self-pay

## 2020-03-21 ENCOUNTER — Ambulatory Visit (INDEPENDENT_AMBULATORY_CARE_PROVIDER_SITE_OTHER): Payer: Medicare Other | Admitting: Cardiovascular Disease

## 2020-03-21 ENCOUNTER — Encounter: Payer: Self-pay | Admitting: Cardiovascular Disease

## 2020-03-21 VITALS — BP 110/50 | HR 67 | Resp 15 | Ht 69.0 in | Wt 186.6 lb

## 2020-03-21 DIAGNOSIS — I10 Essential (primary) hypertension: Secondary | ICD-10-CM

## 2020-03-21 DIAGNOSIS — E782 Mixed hyperlipidemia: Secondary | ICD-10-CM | POA: Diagnosis not present

## 2020-03-21 DIAGNOSIS — I25119 Atherosclerotic heart disease of native coronary artery with unspecified angina pectoris: Secondary | ICD-10-CM | POA: Diagnosis not present

## 2020-03-21 DIAGNOSIS — I350 Nonrheumatic aortic (valve) stenosis: Secondary | ICD-10-CM

## 2020-03-21 DIAGNOSIS — I480 Paroxysmal atrial fibrillation: Secondary | ICD-10-CM | POA: Diagnosis not present

## 2020-03-21 DIAGNOSIS — I2510 Atherosclerotic heart disease of native coronary artery without angina pectoris: Secondary | ICD-10-CM | POA: Diagnosis not present

## 2020-03-21 NOTE — Progress Notes (Signed)
Cardiology Office Note:    Date:  03/21/2020   ID:  Derrick [name], Derrick [name] 03/03/1941, MRN [id number]  PCP:  [name], MD  Cardiologist:  [name], MD  Electrophysiologist:  [name], MD   Referring MD: [name], MD   Chief Complaint  Patient presents with  . Coronary Artery Disease    History of Present Illness:    Shahir Karen is a 79 y.o. male with a hx of:  Coronary artery disease ? Status post inferior MI in 2012 treated with DES to the RCA ? LM Dz >> S/p CABG 05/2019: L-LAD, S-OM1, S-PDA  Paroxysmal atrial fibrillation ? CHADS2-VASc=4 (agex2, HTN, CAD) >> Apixaban  Sick sinus syndrome status post pacemaker  Hypertension  Hyperlipidemia  NSVT  Pulmonary sarcoid  MGUS  Chronic inflammatory demyelinating polyneuropathy  Colon cancer  The patient is here alone today.  He recently underwent right total knee replacement.  He has had a lot of pain and difficulty walking as expected right after surgery.  He did not have any cardiac related problems with surgery.  He specifically denies chest pain, shortness of breath, heart palpitations, orthopnea, or PND.  He said some swelling in his right leg in the knee area and below since surgery was done.  No other specific complaints today.  Past Medical History:  Diagnosis Date  . Anemia    iron deficiency anemia- infusion on 02/15/2020 and 02/22/2020  . BENIGN PROSTATIC HYPERTROPHY, WITH OBSTRUCTION 05/28/2010  . CAD (coronary artery disease)    a. s/p NSTEMI 06/01/11: DES to RCA;  b. cath 06/25/11:   dLM 50-60% (FFR 0.87), prox to mid LAD 40-50%, D1 50%, pCFX 50%, RCA stent ok, dPDA 80%, EF 55-60%.  His FFR was felt to be negative and therefore medical therapy was recommended ;  echo 6/12: EF 55-60%, mild AS   . Chronic back pain   . CIDP (chronic inflammatory demyelinating polyneuropathy) ([place]) 04/11/2012  . Colon cancer ([place]) dx'd 2000   "left"  . COLONIC POLYPS, ADENOMATOUS, HX OF 05/28/2010    . Complication of anesthesia    "stopped breathing; related to my sleep apnea" & urinary retention   . COPD (chronic obstructive pulmonary disease) ([place])    "associated w/lung sarcoidosis"  . 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 DISH (diffuse idiopathic skeletal hyperostosis) 12/02/2015  . Diverticulitis   . GENERALIZED OSTEOARTHROSIS UNSPECIFIED SITE 05/28/2010  . GERD 05/28/2010  . Heart murmur   . History of gout   . History of kidney stones   . HYPERLIPIDEMIA 05/28/2010  . Hypertension   . IgM lambda paraproteinemia   . 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 Malignant neoplasm of descending colon ([place]) 05/28/2010  . MITRAL VALVE PROLAPSE 05/28/2010  . 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 NSTEMI (non-ST elevated myocardial infarction) ([place]) 06/03/11  . 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  . OSA on CPAP 05/28/2010  . PALPITATIONS, CHRONIC 05/28/2010  . Parotid gland pain 2010   infection  . Peripheral neuropathy    "tx'd w/targeted chemo" (05/12/2016)  . Pneumonia 2-3 times  . Presence of permanent cardiac pacemaker    Medtronic  . 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 Sarcoidosis    pulmonalis  . UNSPECIFIED INFLAMMATORY AND TOXIC NEUROPATHY 05/28/2010  . Vision loss   . VITAMIN D DEFICIENCY 05/28/2010    Past Surgical History:  Procedure Laterality Date  . APPENDECTOMY  1954   age 83  .  CATARACT EXTRACTION W/ INTRAOCULAR LENS IMPLANT Left   . COLON SURGERY  2000   decending colon   . CORONARY ANGIOPLASTY WITH STENT PLACEMENT  06/03/11  . CORONARY ARTERY BYPASS GRAFT N/A 06/05/2019   Procedure: CORONARY ARTERY BYPASS GRAFTING (CABG) TIMES THREE USING LEFT INTERNAL MAMMARY ARTERY AND RIGHT GREATER SEPHANOUS VEIN;  Surgeon: [name], MD;  Location: [facility];  Service: Open Heart Surgery;  Laterality: N/A;  . ELECTROPHYSIOLOGIC STUDY N/A 05/12/2016   Procedure: Cardioversion;  Surgeon: [name], MD;   Location: [facility];  Service: Cardiovascular;  Laterality: N/A;  . EP IMPLANTABLE DEVICE N/A 05/12/2016   Procedure: Pacemaker Implant;  Surgeon: [name], MD;  Location: [facility];  Service: Cardiovascular;  Laterality: N/A;  . EYE SURGERY     bilateral cataract with lens implant  . HERNIA REPAIR  01/2019   left inguinal hernia repair  . INGUINAL HERNIA REPAIR Left 02/21/2019   Procedure: LEFT INGUINAL HERNIA REPAIR WITH MESH;  Surgeon: [name], MD;  Location: [facility];  Service: General;  Laterality: Left;  . INSERT / REPLACE / REMOVE PACEMAKER  05/12/2016  . INTRAVASCULAR PRESSURE WIRE/FFR STUDY N/A 05/15/2019   Procedure: INTRAVASCULAR PRESSURE WIRE/FFR STUDY;  Surgeon: [name], MD;  Location: [facility];  Service: Cardiovascular;  Laterality: N/A;  . KNEE ARTHROSCOPY Right 2003  . LEFT HEART CATH AND CORONARY ANGIOGRAPHY N/A 05/15/2019   Procedure: LEFT HEART CATH AND CORONARY ANGIOGRAPHY;  Surgeon: [name], MD;  Location: [facility];  Service: Cardiovascular;  Laterality: N/A;  . LEFT HEART CATHETERIZATION WITH CORONARY ANGIOGRAM N/A 03/31/2015   Procedure: LEFT HEART CATHETERIZATION WITH CORONARY ANGIOGRAM;  Surgeon: [name], MD;  Location[facility];  Service: Cardiovascular;  Laterality: N/A;  . LUNG SURGERY  2001   "open lung dissection"  . MOHS SURGERY Left ~ 2008   ear  . PROSTATE BIOPSY  2010  . TEE WITHOUT CARDIOVERSION N/A 06/05/2019   Procedure: TRANSESOPHAGEAL ECHOCARDIOGRAM (TEE);  Surgeon: [name], [name], MD;  Location: [facility];  Service: Open Heart Surgery;  Laterality: N/A;  . TONSILLECTOMY  1950  . TOTAL KNEE ARTHROPLASTY Right 03/11/2020   Procedure: TOTAL KNEE ARTHROPLASTY;  Surgeon: [name], MD;  Location: [facility];  Service: Orthopedics;  Laterality: Right;  70 mins    Current Medications: Current Meds  Medication Sig  . albuterol (VENTOLIN HFA) 108 (90 Base) MCG/ACT inhaler Inhale 2 puffs  into the lungs every 6 (six) hours as needed for wheezing or shortness of breath.  [name] amLODipine (NORVASC) 5 MG tablet Take 5 mg by mouth daily.   [name] aspirin EC 81 MG tablet Take 1 tablet (81 mg total) by mouth daily.  . bimatoprost (LUMIGAN) 0.01 % SOLN Place 1 drop into both eyes at bedtime.  . Cholecalciferol 25 MCG (1000 UT) tablet Take 1,000 Units by mouth daily.   [name] docusate sodium (COLACE) 100 MG capsule Take 1 capsule (100 mg total) by mouth 2 (two) times daily.  . dorzolamide-timolol (COSOPT) 22.3-6.8 MG/ML ophthalmic solution Place 1 drop into both eyes 2 (two) times daily.   [name] ELIQUIS 5 MG TABS tablet Take 1 tablet by mouth twice daily  . finasteride (PROSCAR) 5 MG tablet Take 5 mg by mouth daily after breakfast.   . fluticasone (FLONASE) 50 MCG/ACT nasal spray Place 2 sprays into both nostrils 2 (two) times daily.  . Fluticasone Propionate, Inhal, (FLOVENT DISKUS) 100 MCG/BLIST AEPB Inhale 1  Inhaler into the lungs 2 (two) times daily. (Patient taking differently: Inhale 1 Inhaler into the lungs 2 (two) times daily as needed (shortness of breath/wheezing). )  . methocarbamol (ROBAXIN) 500 MG tablet Take 1 tablet (500 mg total) by mouth every 6 (six) hours as needed for muscle spasms.  . MULTAQ 400 MG tablet TAKE 1 TABLET BY MOUTH TWICE DAILY WITH MEALS  . Multiple Vitamin (MULTIVITAMIN WITH MINERALS) TABS tablet Take 1 tablet by mouth daily.  [name] NARCAN 4 MG/0.1ML LIQD nasal spray kit Place 4 mg into the nose as directed.  [name] oxyCODONE (OXY IR/ROXICODONE) 5 MG immediate release tablet Take 5-10 mg by mouth every 4 (four) hours as needed.  . pantoprazole (PROTONIX) 40 MG tablet Take 1 tablet by mouth once daily  . Polyethyl Glycol-Propyl Glycol (SYSTANE OP) Place 1 drop into both eyes daily as needed (dry eyes).  . rosuvastatin (CRESTOR) 20 MG tablet Take 1 tablet (20 mg total) by mouth daily.  . tadalafil (CIALIS) 5 MG tablet Take 5 mg by mouth at bedtime.   .  triamterene-hydrochlorothiazide (MAXZIDE-25) 37.5-25 MG tablet Take 1 tablet by mouth once daily     Allergies:   Miralax [polyethylene glycol] and Nsaids   Social History   Socioeconomic History  . Marital status: Married    Spouse name: Not on file  . Number of children: 2  . Years of education: MS-Coll.  . Highest education level: Not on file  Occupational History  . Occupation: Optometrist    Comment: Building services engineer  Tobacco Use  . Smoking status: Never Smoker  . Smokeless tobacco: Never Used  Substance and Sexual Activity  . Alcohol use: Yes    Comment: occasional  . Drug use: No  . Sexual activity: Yes    Birth control/protection: None  Other Topics Concern  . Not on file  Social History Narrative   College- [employer]; [employer]-environment science and mgt. Married [name])  "28" 1 son- 53; 1 dtr "29" 2 grandchildren. 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 End of life-provided discussive context and provided packet.   Social Determinants of Health   Financial Resource Strain: Low Risk   . Difficulty of Paying Living Expenses: Not hard at all  Food Insecurity: No Food Insecurity  . Worried About Charity fundraiser in the Last Year: Never true  . Ran Out of Food in the Last Year: Never true  Transportation Needs: No Transportation Needs  . Lack of Transportation (Medical): No  . Lack of Transportation (Non-Medical): No  Physical Activity: Sufficiently Active  . Days of Exercise per Week: 7 days  . Minutes of Exercise per Session: 30 min  Stress: No Stress Concern Present  . Feeling of Stress : Not at all  Social Connections:   . Frequency of Communication with Friends and Family:   . Frequency of Social Gatherings with Friends and Family:   . Attends Religious Services:   . Active Member of Clubs or Organizations:   . Attends Archivist Meetings:   [name] Marital Status:      Family History: The patient's family history includes  Arrhythmia (age of onset: 90) in his brother; Arthritis in his mother; Cancer in his brother; Colon polyps in his mother; Congestive Heart Failure in his brother; Coronary artery disease in his brother and mother; Heart attack in his brother; Heart disease in his mother.  ROS:   Please see the history of present illness.    All other systems reviewed  and are negative.  EKGs/Labs/Other Studies Reviewed:    The following studies were reviewed today: Pre CABG Dopplers 06/01/2019 Bilat ICA 1-39  Echo 05/22/2019 EF 55-60, Gr 2 DD, apical HK, severe LAE, mild MAC, mild AS (mean 12), mild to mod dilation of ascending aorta (43 mm)  Cardiac Catheterization5/19/2020 1. Severe distal left main stenosis, hemodynamically significant by DFR analysis 2. Moderate proximal LAD stenosis 3. Continued patency of the RCA stents 4. Nonobstructive LCx stenosis 5. Mild aortic stenosis with peak-to-peak gradient < 10 mmHg by pullback Recommend: cardiac surgical consultation for consideration of CABG in this patient with symptomatic left main disease. Last echo 09/2018 should be updated to reevaluate severity of aortic stenosis  EKG:  EKG is not ordered today.    Recent Labs: 06/06/2019: Magnesium 2.3 02/01/2020: ALT 10 03/12/2020: BUN 15; Creatinine, Ser 0.92; Hemoglobin 10.0; Platelets 164; Potassium 3.6; Sodium 134  Recent Lipid Panel    Component Value Date/Time   CHOL 155 01/22/2020 0731   TRIG 82 01/22/2020 0731   HDL 64 01/22/2020 0731   CHOLHDL 2.4 01/22/2020 0731   CHOLHDL 2.7 03/19/2016 0922   VLDL 14 03/19/2016 0922   LDLCALC 76 01/22/2020 0731    Physical Exam:    VS:  BP (!) 110/50   Pulse 67   Resp 15   Ht _0  (1.753 m)   Wt 186 lb 9.6 oz (84.6 kg)   SpO2 99%   BMI 27.56 kg/m     Wt Readings from Last 3 Encounters:  03/21/20 186 lb 9.6 oz (84.6 kg)  03/11/20 184 lb 1.4 oz (83.5 kg)  03/04/20 188 lb (85.3 kg)     GEN: Well nourished, well developed in no acute  distress HEENT: Normal NECK: No JVD; No carotid bruits LYMPHATICS: No lymphadenopathy CARDIAC: RRR, 2/6 harsh mid peaking systolic murmur at the right upper sternal border with no diastolic murmur RESPIRATORY:  Clear to auscultation without rales, wheezing or rhonchi  ABDOMEN: Soft, non-tender, non-distended MUSCULOSKELETAL:  No edema; No deformity  SKIN: Warm and dry NEUROLOGIC:  Alert and oriented x 3 PSYCHIATRIC:  Normal affect   ASSESSMENT:    1. Coronary artery disease involving native coronary artery of native heart without angina pectoris   2. Paroxysmal atrial fibrillation ([name])   3. Essential hypertension, benign   4. Mixed hyperlipidemia   5. Nonrheumatic aortic valve stenosis    PLAN:    In order of problems listed above:  1. Changed from atorvastatin to rosuvastatin, now increased to 20 mg daily. Made the medicine change 10 days ago - recommend lipids/LFT's in about 8 weeks.  2. Appears to be maintaining sinus rhythm.  Continue apixaban for anticoagulation and dronedarone for rhythm control. 3. Blood pressure is well controlled on amlodipine, triamterene, and hydrochlorothiazide. 4. Continue rosuvastatin.  Follow lipids since he changed from atorvastatin.  Check labs in [redacted] weeks along with LFTs. 5. The patient's aortic valve murmur is a little more prominent on exam.  Will follow up and annual echocardiogram when he returns for lab work.  No cardinal symptoms are present.  Exam is not suggestive of severe aortic stenosis.   Medication Adjustments/Labs and Tests Ordered: Current medicines are reviewed at length with the patient today.  Concerns regarding medicines are outlined above.  No orders of the defined types were placed in this encounter.  No orders of the defined types were placed in this encounter.   There are no Patient Instructions on file for this visit.  Signed, [name], MD  03/21/2020 2:00 PM    [place]

## 2020-03-21 NOTE — Patient Instructions (Signed)
Medication Instructions:  Your provider recommends that you continue on your current medications as directed. Please refer to the Current Medication list given to you today.   *If you need a refill on your cardiac medications before your next appointment, please call your pharmacy*  Lab Work: Your provider recommends that you return for FASTING lab work in: 2 MONTHS (when you have your echo) If you have labs (blood work) drawn today and your tests are completely normal, you will receive your results only by: [name] MyChart Message (if you have MyChart) OR . A paper copy in the mail If you have any lab test that is abnormal or we need to change your treatment, we will call you to review the results.  Testing/Procedures: Your provider has requested that you have an echocardiogram in 2 months. Echocardiography is a painless test that uses sound waves to create images of your heart. It provides your doctor with information about the size and shape of your heart and how well your heart's chambers and valves are working. This procedure takes approximately one hour. There are no restrictions for this procedure.  Follow-Up: At [facility], you and your health needs are our priority.  As part of our continuing mission to provide you with exceptional heart care, we have created designated Provider Care Teams.  These Care Teams include your primary Cardiologist (physician) and Advanced Practice Providers (APPs -  Physician Assistants and Nurse Practitioners) who all work together to provide you with the care you need, when you need it. Your next appointment:   6 month(s) The format for your next appointment:   In Person Provider:   You may see [name], MD or one of the following Advanced Practice Providers on your designated Care Team:    Richardson Dopp, PA-C  [place], [place]

## 2020-03-24 ENCOUNTER — Ambulatory Visit (INDEPENDENT_AMBULATORY_CARE_PROVIDER_SITE_OTHER): Payer: Medicare Other | Admitting: *Deleted

## 2020-03-24 DIAGNOSIS — I480 Paroxysmal atrial fibrillation: Secondary | ICD-10-CM | POA: Diagnosis not present

## 2020-03-24 DIAGNOSIS — M25661 Stiffness of right knee, not elsewhere classified: Secondary | ICD-10-CM | POA: Diagnosis not present

## 2020-03-24 DIAGNOSIS — M25561 Pain in right knee: Secondary | ICD-10-CM | POA: Diagnosis not present

## 2020-03-24 LAB — CUP PACEART REMOTE DEVICE CHECK
Battery Remaining Longevity: 62 mo
Battery Voltage: 3 V
Brady Statistic AP VP Percent: 95.94 %
Brady Statistic AP VS Percent: 0.01 %
Brady Statistic AS VP Percent: 4.05 %
Brady Statistic AS VS Percent: 0 %
Brady Statistic RA Percent Paced: 95.68 %
Brady Statistic RV Percent Paced: 99.79 %
Date Time Interrogation Session: 20210329073859
Implantable Lead Implant Date: 20170517
Implantable Lead Implant Date: 20170517
Implantable Lead Location: 753859
Implantable Lead Location: 753860
Implantable Lead Model: 5076
Implantable Lead Model: 5076
Implantable Pulse Generator Implant Date: 20170517
Lead Channel Impedance Value: 342 Ohm
Lead Channel Impedance Value: 399 Ohm
Lead Channel Impedance Value: 475 Ohm
Lead Channel Impedance Value: 589 Ohm
Lead Channel Pacing Threshold Amplitude: 0.875 V
Lead Channel Pacing Threshold Amplitude: 0.875 V
Lead Channel Pacing Threshold Pulse Width: 0.4 ms
Lead Channel Pacing Threshold Pulse Width: 0.4 ms
Lead Channel Sensing Intrinsic Amplitude: 1.5 mV
Lead Channel Sensing Intrinsic Amplitude: 1.5 mV
Lead Channel Sensing Intrinsic Amplitude: 9.5 mV
Lead Channel Sensing Intrinsic Amplitude: 9.5 mV
Lead Channel Setting Pacing Amplitude: 2 V
Lead Channel Setting Pacing Amplitude: 2.5 V
Lead Channel Setting Pacing Pulse Width: 0.4 ms
Lead Channel Setting Sensing Sensitivity: 2.8 mV

## 2020-03-24 NOTE — Progress Notes (Signed)
PPM Remote  

## 2020-03-25 ENCOUNTER — Encounter: Payer: Self-pay | Admitting: Cardiology

## 2020-03-25 ENCOUNTER — Ambulatory Visit (INDEPENDENT_AMBULATORY_CARE_PROVIDER_SITE_OTHER): Payer: Medicare Other | Admitting: Cardiology

## 2020-03-25 ENCOUNTER — Other Ambulatory Visit: Payer: Self-pay

## 2020-03-25 VITALS — BP 122/70 | HR 64 | Ht 69.0 in | Wt 181.0 lb

## 2020-03-25 DIAGNOSIS — I25119 Atherosclerotic heart disease of native coronary artery with unspecified angina pectoris: Secondary | ICD-10-CM

## 2020-03-25 DIAGNOSIS — I495 Sick sinus syndrome: Secondary | ICD-10-CM

## 2020-03-25 NOTE — Progress Notes (Signed)
Electrophysiology Office Note   Date:  03/25/2020   ID:  [name], [name] July 15, 1941, MRN [id number]  PCP:  [name], MD  Cardiologist:  Burt Knack Primary Electrophysiologist:  Otto Felkins [name], MD    No chief complaint on file.    History of Present Illness: Derrick [name] is a 79 y.o. male who presents today for electrophysiology evaluation.   He has been followed for coronary artery disease. He has a history of myocardial infarction and drug-eluting stent placement in the right coronary artery. 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 He's been managed medically. He has multiple noncardiac problems including CIDP, pulmonary sarcoid, and monoclonal gammopathy of undetermined significance.  He was having episodes of weakness and fatigue and had an echo that showed a normal ejection fraction.  Left heart catheterization showed severe left main disease and he had a three-vessel CABG 05/2019.  Today, denies symptoms of palpitations, chest pain, shortness of breath, orthopnea, PND, lower extremity edema, claudication, dizziness, presyncope, syncope, bleeding, or neurologic sequela. The patient is tolerating medications without difficulties.  Overall he is doing well.  He has no chest pain or shortness of breath.  He recently had a knee replacement, so that he can get back to his cardiac rehab.  Otherwise he is doing well without complaint.   Past Medical History:  Diagnosis Date  . Anemia    iron deficiency anemia- infusion on 02/15/2020 and 02/22/2020  . BENIGN PROSTATIC HYPERTROPHY, WITH OBSTRUCTION 05/28/2010  . CAD (coronary artery disease)    a. s/p NSTEMI 06/01/11: DES to RCA;  b. cath 06/25/11:   dLM 50-60% (FFR 0.87), prox to mid LAD 40-50%, D1 50%, pCFX 50%, RCA stent ok, dPDA 80%, EF 55-60%.  His FFR was felt to be negative and therefore medical therapy was recommended ;  echo 6/12: EF 55-60%, mild AS   .  Chronic back pain   . CIDP (chronic inflammatory demyelinating polyneuropathy) ([place]) 04/11/2012  . Colon cancer ([place]) dx'd 2000   "left"  . COLONIC POLYPS, ADENOMATOUS, HX OF 05/28/2010  . Complication of anesthesia    "stopped breathing; related to my sleep apnea" & urinary retention   . COPD (chronic obstructive pulmonary disease) ([place])    "associated w/lung sarcoidosis"  . 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 DISH (diffuse idiopathic skeletal hyperostosis) 12/02/2015  . Diverticulitis   . GENERALIZED OSTEOARTHROSIS UNSPECIFIED SITE 05/28/2010  . GERD 05/28/2010  . Heart murmur   . History of gout   . History of kidney stones   . HYPERLIPIDEMIA 05/28/2010  . Hypertension   . IgM lambda paraproteinemia   . 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 Malignant neoplasm of descending colon ([place]) 05/28/2010  . MITRAL VALVE PROLAPSE 05/28/2010  . 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 NSTEMI (non-ST elevated myocardial infarction) ([place]) 06/03/11  . 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  . OSA on CPAP 05/28/2010  . PALPITATIONS, CHRONIC 05/28/2010  . Parotid gland pain 2010   infection  . Peripheral neuropathy    "tx'd w/targeted chemo" (05/12/2016)  . Pneumonia 2-3 times  . Presence of permanent cardiac pacemaker    [employer]  . 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 Sarcoidosis    pulmonalis  . UNSPECIFIED INFLAMMATORY AND TOXIC NEUROPATHY 05/28/2010  . Vision loss   . VITAMIN D DEFICIENCY 05/28/2010   Past Surgical History:  Procedure Laterality Date  . [place]  age 41  . CATARACT EXTRACTION W/ INTRAOCULAR LENS IMPLANT Left   . COLON SURGERY  2000   decending colon   . CORONARY ANGIOPLASTY WITH STENT PLACEMENT  06/03/11  . CORONARY ARTERY BYPASS GRAFT N/A 06/05/2019   Procedure: CORONARY ARTERY BYPASS GRAFTING (CABG) TIMES THREE USING LEFT INTERNAL MAMMARY ARTERY AND RIGHT GREATER SEPHANOUS VEIN;  Surgeon: [name],  MD;  Location: [facility];  Service: Open Heart Surgery;  Laterality: N/A;  . ELECTROPHYSIOLOGIC STUDY N/A 05/12/2016   Procedure: Cardioversion;  Surgeon: Dodge Ator [name], MD;  Location: [facility];  Service: Cardiovascular;  Laterality: N/A;  . EP IMPLANTABLE DEVICE N/A 05/12/2016   Procedure: Pacemaker Implant;  Surgeon: Ioanna Colquhoun [name], MD;  Location: [facility];  Service: Cardiovascular;  Laterality: N/A;  . EYE SURGERY     bilateral cataract with lens implant  . HERNIA REPAIR  01/2019   left inguinal hernia repair  . INGUINAL HERNIA REPAIR Left 02/21/2019   Procedure: LEFT INGUINAL HERNIA REPAIR WITH MESH;  Surgeon: [name], MD;  Location: [facility];  Service: General;  Laterality: Left;  . INSERT / REPLACE / REMOVE PACEMAKER  05/12/2016  . INTRAVASCULAR PRESSURE WIRE/FFR STUDY N/A 05/15/2019   Procedure: INTRAVASCULAR PRESSURE WIRE/FFR STUDY;  Surgeon: [name], MD;  Location: [facility];  Service: Cardiovascular;  Laterality: N/A;  . KNEE ARTHROSCOPY Right 2003  . LEFT HEART CATH AND CORONARY ANGIOGRAPHY N/A 05/15/2019   Procedure: LEFT HEART CATH AND CORONARY ANGIOGRAPHY;  Surgeon: [name], MD;  Location: [facility];  Service: Cardiovascular;  Laterality: N/A;  . LEFT HEART CATHETERIZATION WITH CORONARY ANGIOGRAM N/A 03/31/2015   Procedure: LEFT HEART CATHETERIZATION WITH CORONARY ANGIOGRAM;  Surgeon: [name], MD;  Location: [facility] C[facility];  Service: Cardiovascular;  Laterality: N/A;  . LUNG SURGERY  2001   "open lung dissection"  . MOHS SURGERY Left ~ 2008   ear  . PROSTATE BIOPSY  2010  . TEE WITHOUT CARDIOVERSION N/A 06/05/2019   Procedure: TRANSESOPHAGEAL ECHOCARDIOGRAM (TEE);  Surgeon: [name], [name], MD;  Location: [facility];  Service: Open Heart Surgery;  Laterality: N/A;  . TONSILLECTOMY  1950  . TOTAL KNEE ARTHROPLASTY Right 03/11/2020   Procedure: TOTAL KNEE ARTHROPLASTY;  Surgeon: [name], MD;  Location: [facility];   Service: Orthopedics;  Laterality: Right;  70 mins     Current Outpatient Medications  Medication Sig Dispense Refill  . albuterol (VENTOLIN HFA) 108 (90 Base) MCG/ACT inhaler Inhale 2 puffs into the lungs every 6 (six) hours as needed for wheezing or shortness of breath. 8 g 2  . amLODipine (NORVASC) 5 MG tablet Take 5 mg by mouth daily.     [name] aspirin EC 81 MG tablet Take 1 tablet (81 mg total) by mouth daily. 150 tablet 2  . bimatoprost (LUMIGAN) 0.01 % SOLN Place 1 drop into both eyes at bedtime.    . Cholecalciferol 25 MCG (1000 UT) tablet Take 1,000 Units by mouth daily.     [name] docusate sodium (COLACE) 100 MG capsule Take 1 capsule (100 mg total) by mouth 2 (two) times daily. 28 capsule 0  . dorzolamide-timolol (COSOPT) 22.3-6.8 MG/ML ophthalmic solution Place 1 drop into both eyes 2 (two) times daily.     [name] ELIQUIS 5 MG TABS tablet Take 1 tablet by mouth twice daily 180 tablet 2  . finasteride (PROSCAR) 5 MG tablet Take 5 mg by mouth daily after breakfast.     .  fluticasone (FLONASE) 50 MCG/ACT nasal spray Place 2 sprays into both nostrils 2 (two) times daily.    . Fluticasone Propionate, Inhal, (FLOVENT DISKUS) 100 MCG/BLIST AEPB Inhale 1 Inhaler into the lungs 2 (two) times daily. (Patient taking differently: Inhale 1 Inhaler into the lungs 2 (two) times daily as needed (shortness of breath/wheezing). ) 60 each 2  . methocarbamol (ROBAXIN) 500 MG tablet Take 1 tablet (500 mg total) by mouth every 6 (six) hours as needed for muscle spasms. 40 tablet 0  . MULTAQ 400 MG tablet TAKE 1 TABLET BY MOUTH TWICE DAILY WITH MEALS 180 tablet 3  . Multiple Vitamin (MULTIVITAMIN WITH MINERALS) TABS tablet Take 1 tablet by mouth daily.    [name] NARCAN 4 MG/0.1ML LIQD nasal spray kit Place 4 mg into the nose as directed.    [name] oxyCODONE (OXY IR/ROXICODONE) 5 MG immediate release tablet Take 5-10 mg by mouth every 4 (four) hours as needed.    . pantoprazole (PROTONIX) 40 MG tablet Take 1 tablet by mouth once  daily 90 tablet 2  . Polyethyl Glycol-Propyl Glycol (SYSTANE OP) Place 1 drop into both eyes daily as needed (dry eyes).    . rosuvastatin (CRESTOR) 20 MG tablet Take 1 tablet (20 mg total) by mouth daily. 30 tablet 11  . tadalafil (CIALIS) 5 MG tablet Take 5 mg by mouth at bedtime.     . triamterene-hydrochlorothiazide (MAXZIDE-25) 37.5-25 MG tablet Take 1 tablet by mouth once daily 90 tablet 3   No current facility-administered medications for this visit.    Allergies:   Miralax [polyethylene glycol] and Nsaids   Social History:  The patient  reports that he has never smoked. He has never used smokeless tobacco. He reports current alcohol use. He reports that he does not use drugs.   Family History:  The patient's family history includes Arrhythmia (age of onset: 6) in his brother; Arthritis in his mother; Cancer in his brother; Colon polyps in his mother; Congestive Heart Failure in his brother; Coronary artery disease in his brother and mother; Heart attack in his brother; Heart disease in his mother.   ROS:  Please see the history of present illness.   Otherwise, review of systems is positive for none.   All other systems are reviewed and negative.   PHYSICAL EXAM: VS:  BP 122/70   Pulse 64   Ht _0  (1.753 m)   Wt 181 lb (82.1 kg)   SpO2 99%   BMI 26.73 kg/m  , BMI Body mass index is 26.73 kg/m. GEN: Well nourished, well developed, in no acute distress  HEENT: normal  Neck: no JVD, carotid bruits, or masses Cardiac: RRR; 2 out of 6 systolic murmur at the base, no, rubs, or gallops,no edema  Respiratory:  clear to auscultation bilaterally, normal work of breathing GI: soft, nontender, nondistended, + BS MS: no deformity or atrophy  Skin: warm and dry, device site well healed Neuro:  Strength and sensation are intact Psych: euthymic mood, full affect  EKG:  EKG is ordered today. Personal review of the ekg ordered shows AV paced  Personal review of the device  interrogation today. Results in [place]: 06/06/2019: Magnesium 2.3 02/01/2020: ALT 10 03/12/2020: BUN 15; Creatinine, Ser 0.92; Hemoglobin 10.0; Platelets 164; Potassium 3.6; Sodium 134    Lipid Panel     Component Value Date/Time   CHOL 155 01/22/2020 0731   TRIG 82 01/22/2020 0731   HDL 64 01/22/2020 0731  CHOLHDL 2.4 01/22/2020 0731   CHOLHDL 2.7 03/19/2016 0922   VLDL 14 03/19/2016 0922   LDLCALC 76 01/22/2020 0731     Wt Readings from Last 3 Encounters:  03/25/20 181 lb (82.1 kg)  03/21/20 186 lb 9.6 oz (84.6 kg)  03/11/20 184 lb 1.4 oz (83.5 kg)      Other studies Reviewed: Additional studies/ records that were reviewed today include: TTE 05/22/19 Review of the above records today demonstrates:    1. The left ventricle has normal systolic function, with an ejection fraction of 55-60%. The cavity size was normal. Left ventricular diastolic Doppler parameters are consistent with pseudonormalization. Indeterminate filling pressures.  2. Apical hypokinesis.  3. The right ventricle has normal systolic function. The cavity was normal. There is no increase in right ventricular wall thickness.  4. Left atrial size was severely dilated.  5. There is mild mitral annular calcification present. No evidence of mitral valve stenosis.  6. The aortic valve is tricuspid. Moderate thickening of the aortic valve. Moderate calcification of the aortic valve. Mild stenosis of the aortic valve.  7. There is mild to moderate dilatation of the ascending aorta measuring 43 mm.   ASSESSMENT AND PLAN:  1.  Paroxysmal atrial fibrillation: Currently on Eliquis and Multaq.  CHA2DS2-VASc of 3.  Mains in sinus rhythm.   2. Hypertension: Currently well controlled  3. Hyperlipidemia: Continue statin  4. Coronary artery disease: Status post CABG 06/05/2019.  Currently feeling well.  Plan per primary cardiology.    5. Sick sinus syndrome: Tronic dual-chamber pacemaker implanted  05/12/2016.  Device functioning appropriately.  No changes at this time.    Current medicines are reviewed at length with the patient today.   The patient does not have concerns regarding his medicines.  The following changes were made today: None  Labs/ tests ordered today include:  Orders Placed This Encounter  Procedures  . EKG 12-Lead     Disposition:   FU with Alyria Krack 6 months  Signed, Shellye Zandi [name], MD  03/25/2020 2:24 PM     [facility] [address] [place] [address] [phone] (office) [phone] (fax)

## 2020-03-25 NOTE — Patient Instructions (Signed)
Medication Instructions:  Your physician recommends that you continue on your current medications as directed. Please refer to the Current Medication list given to you today.  *If you need a refill on your cardiac medications before your next appointment, please call your pharmacy*   Lab Work: None ordered If you have labs (blood work) drawn today and your tests are completely normal, you will receive your results only by: [name] MyChart Message (if you have MyChart) OR . A paper copy in the mail If you have any lab test that is abnormal or we need to change your treatment, we will call you to review the results.   Testing/Procedures: None ordered   Follow-Up: Remote monitoring is used to monitor your Pacemaker of ICD from home. This monitoring reduces the number of office visits required to check your device to one time per year. It allows [place] to keep an eye on the functioning of your device to ensure it is working properly. You are scheduled for a device check from home on 06/23/2020. You may send your transmission at any time that day. If you have a wireless device, the transmission will be sent automatically. After your physician reviews your transmission, you will receive a postcard with your next transmission date.   At [facility], you and your health needs are our priority.  As part of our continuing mission to provide you with exceptional heart care, we have created designated Provider Care Teams.  These Care Teams include your primary Cardiologist (physician) and Advanced Practice Providers (APPs -  Physician Assistants and Nurse Practitioners) who all work together to provide you with the care you need, when you need it.  We recommend signing up for the patient portal called "MyChart".  Sign up information is provided on this After Visit Summary.  MyChart is used to connect with patients for Virtual Visits (Telemedicine).  Patients are able to view lab/test results, encounter notes,  upcoming appointments, etc.  Non-urgent messages can be sent to your provider as well.   To learn more about what you can do with MyChart, go to [email or web address].    Your next appointment:   6 month(s)  The format for your next appointment:   In Person  Provider:   Allegra Lai, MD   Thank you for choosing [facility]!!   Trinidad Curet, RN [phone]    Other Instructions

## 2020-03-26 DIAGNOSIS — M25561 Pain in right knee: Secondary | ICD-10-CM | POA: Diagnosis not present

## 2020-03-26 DIAGNOSIS — M25661 Stiffness of right knee, not elsewhere classified: Secondary | ICD-10-CM | POA: Diagnosis not present

## 2020-03-28 LAB — CUP PACEART INCLINIC DEVICE CHECK
Brady Statistic RA Percent Paced: 93.8 %
Brady Statistic RV Percent Paced: 99.6 %
Date Time Interrogation Session: 20210330144214
Implantable Lead Implant Date: 20170517
Implantable Lead Implant Date: 20170517
Implantable Lead Location: 753859
Implantable Lead Location: 753860
Implantable Lead Model: 5076
Implantable Lead Model: 5076
Implantable Pulse Generator Implant Date: 20170517
Lead Channel Impedance Value: 399 Ohm
Lead Channel Impedance Value: 608 Ohm
Lead Channel Pacing Threshold Amplitude: 1 V
Lead Channel Pacing Threshold Amplitude: 1 V
Lead Channel Pacing Threshold Pulse Width: 0.4 ms
Lead Channel Pacing Threshold Pulse Width: 0.4 ms
Lead Channel Sensing Intrinsic Amplitude: 1 mV
Lead Channel Sensing Intrinsic Amplitude: 11.1 mV
Lead Channel Setting Pacing Amplitude: 2 V
Lead Channel Setting Pacing Amplitude: 2.5 V
Lead Channel Setting Pacing Pulse Width: 0.4 ms
Lead Channel Setting Sensing Sensitivity: 2.8 mV

## 2020-03-31 DIAGNOSIS — M25661 Stiffness of right knee, not elsewhere classified: Secondary | ICD-10-CM | POA: Diagnosis not present

## 2020-03-31 DIAGNOSIS — M25561 Pain in right knee: Secondary | ICD-10-CM | POA: Diagnosis not present

## 2020-04-03 DIAGNOSIS — M25661 Stiffness of right knee, not elsewhere classified: Secondary | ICD-10-CM | POA: Diagnosis not present

## 2020-04-03 DIAGNOSIS — M25561 Pain in right knee: Secondary | ICD-10-CM | POA: Diagnosis not present

## 2020-04-09 DIAGNOSIS — M25561 Pain in right knee: Secondary | ICD-10-CM | POA: Diagnosis not present

## 2020-04-09 DIAGNOSIS — M25661 Stiffness of right knee, not elsewhere classified: Secondary | ICD-10-CM | POA: Diagnosis not present

## 2020-04-09 MED ORDER — ROSUVASTATIN CALCIUM 20 MG PO TABS: 20.0000 mg | ORAL_TABLET | Freq: Every day | ORAL | 3 refills | Status: DC

## 2020-04-11 ENCOUNTER — Telehealth: Payer: Self-pay

## 2020-04-11 ENCOUNTER — Encounter: Payer: Self-pay | Admitting: Hematology and Oncology

## 2020-04-11 DIAGNOSIS — M25661 Stiffness of right knee, not elsewhere classified: Secondary | ICD-10-CM | POA: Diagnosis not present

## 2020-04-11 DIAGNOSIS — M25561 Pain in right knee: Secondary | ICD-10-CM | POA: Diagnosis not present

## 2020-04-11 NOTE — Telephone Encounter (Signed)
Agree with contacting hematology.. if not improving make appt in office for eval of fatigue.

## 2020-04-11 NOTE — Telephone Encounter (Signed)
Pt c/o severe fatigue and being cold all the time and SOB with exertion. Pt denies cough, fever, HA, sore throat, N/V/D, or any other covid symptoms. Pt reports he had knee surgery about 5 wks ago and felt he was fatigued and anemic going into the surgery. Pt reports pain in the knee rated at a 8 "most of the time". He has discussed the pain with the surgeon and they report this is probably normal. He has not mentioned the fatigue, SOB or continuously feeling cold. Pt has hx of anemia and had received last iron infusion the middle of Feb 2021. Infusion ordered by hematology. Advised pt to contact hematology and the surgeon and that a msg would be sent to PCP. Advised if anything symptoms worsen or he develops any new symptoms to go to the ER or UC. Pt verbalized understanding and said he would contact surgeon and hematology now.

## 2020-04-14 ENCOUNTER — Telehealth: Payer: Self-pay | Admitting: Hematology and Oncology

## 2020-04-14 ENCOUNTER — Other Ambulatory Visit: Payer: Self-pay

## 2020-04-14 ENCOUNTER — Inpatient Hospital Stay: Payer: Medicare Other | Attending: Hematology and Oncology | Admitting: Hematology and Oncology

## 2020-04-14 ENCOUNTER — Inpatient Hospital Stay: Payer: Medicare Other

## 2020-04-14 ENCOUNTER — Encounter: Payer: Self-pay | Admitting: Hematology and Oncology

## 2020-04-14 DIAGNOSIS — D508 Other iron deficiency anemias: Secondary | ICD-10-CM | POA: Diagnosis not present

## 2020-04-14 DIAGNOSIS — Z471 Aftercare following joint replacement surgery: Secondary | ICD-10-CM | POA: Diagnosis not present

## 2020-04-14 DIAGNOSIS — R5383 Other fatigue: Secondary | ICD-10-CM | POA: Diagnosis not present

## 2020-04-14 DIAGNOSIS — Z96651 Presence of right artificial knee joint: Secondary | ICD-10-CM | POA: Diagnosis not present

## 2020-04-14 DIAGNOSIS — M25561 Pain in right knee: Secondary | ICD-10-CM | POA: Diagnosis not present

## 2020-04-14 DIAGNOSIS — D472 Monoclonal gammopathy: Secondary | ICD-10-CM

## 2020-04-14 DIAGNOSIS — I25119 Atherosclerotic heart disease of native coronary artery with unspecified angina pectoris: Secondary | ICD-10-CM

## 2020-04-14 DIAGNOSIS — D509 Iron deficiency anemia, unspecified: Secondary | ICD-10-CM | POA: Insufficient documentation

## 2020-04-14 LAB — CBC WITH DIFFERENTIAL/PLATELET
Abs Immature Granulocytes: 0.01 10*3/uL (ref 0.00–0.07)
Basophils Absolute: 0 10*3/uL (ref 0.0–0.1)
Basophils Relative: 1 %
Eosinophils Absolute: 0.1 10*3/uL (ref 0.0–0.5)
Eosinophils Relative: 2 %
HCT: 36.5 % — ABNORMAL LOW (ref 39.0–52.0)
Hemoglobin: 11.7 g/dL — ABNORMAL LOW (ref 13.0–17.0)
Immature Granulocytes: 0 %
Lymphocytes Relative: 24 %
Lymphs Abs: 0.8 10*3/uL (ref 0.7–4.0)
MCH: 28.4 pg (ref 26.0–34.0)
MCHC: 32.1 g/dL (ref 30.0–36.0)
MCV: 88.6 fL (ref 80.0–100.0)
Monocytes Absolute: 0.4 10*3/uL (ref 0.1–1.0)
Monocytes Relative: 10 %
Neutro Abs: 2.2 10*3/uL (ref 1.7–7.7)
Neutrophils Relative %: 63 %
Platelets: 179 10*3/uL (ref 150–400)
RBC: 4.12 MIL/uL — ABNORMAL LOW (ref 4.22–5.81)
RDW: 18.6 % — ABNORMAL HIGH (ref 11.5–15.5)
WBC: 3.4 10*3/uL — ABNORMAL LOW (ref 4.0–10.5)
nRBC: 0 % (ref 0.0–0.2)

## 2020-04-14 LAB — COMPREHENSIVE METABOLIC PANEL
ALT: 14 U/L (ref 0–44)
AST: 19 U/L (ref 15–41)
Albumin: 4.2 g/dL (ref 3.5–5.0)
Alkaline Phosphatase: 57 U/L (ref 38–126)
Anion gap: 8 (ref 5–15)
BUN: 17 mg/dL (ref 8–23)
CO2: 28 mmol/L (ref 22–32)
Calcium: 9.8 mg/dL (ref 8.9–10.3)
Chloride: 103 mmol/L (ref 98–111)
Creatinine, Ser: 0.95 mg/dL (ref 0.61–1.24)
GFR calc Af Amer: >60 mL/min (ref >60)
GFR calc non Af Amer: >60 mL/min (ref >60)
Glucose, Bld: 107 mg/dL — ABNORMAL HIGH (ref 70–99)
Potassium: 3.9 mmol/L (ref 3.5–5.1)
Sodium: 139 mmol/L (ref 135–145)
Total Bilirubin: 0.5 mg/dL (ref 0.3–1.2)
Total Protein: 7.2 g/dL (ref 6.5–8.1)

## 2020-04-14 LAB — IRON AND TIBC
Iron: 60 ug/dL (ref 42–163)
Saturation Ratios: 17 % — ABNORMAL LOW (ref 20–55)
TIBC: 354 ug/dL (ref 202–409)
UIBC: 294 ug/dL (ref 117–376)

## 2020-04-14 LAB — FERRITIN: Ferritin: 136 ng/mL (ref 24–336)

## 2020-04-14 NOTE — Assessment & Plan Note (Signed)
His last myeloma panel was drawn in September and it was borderline abnormal His panel is drawn accidentally today I will cancel it and will repeated in September

## 2020-04-14 NOTE — Telephone Encounter (Signed)
Scheduled appt per 4/19 sch message.  Called pt and he is aware of the appt date and time

## 2020-04-14 NOTE — Telephone Encounter (Signed)
Left message for [name] that Dr. [name] received his message on Friday.  She agreed with contacting hematology and she knows he was seen by them today but Dr. [name] said if not improving, he should  make an appt with her for eval of fatigue.  I ask that he call us back if he has any questions.

## 2020-04-14 NOTE — Telephone Encounter (Signed)
Mr. Tumlinson is seeing his hematologist today 04/14/2020.  Do you still want me to call him for an appointment with you?

## 2020-04-14 NOTE — Telephone Encounter (Signed)
No appt only needed if not improving.

## 2020-04-14 NOTE — Progress Notes (Signed)
[place] OFFICE PROGRESS NOTE  Patient Care Team: [name], MD as PCP - General (Family Medicine) [name], MD as PCP - Electrophysiology (Cardiology) [name], MD as PCP - Cardiology (Cardiology) Heath Lark, MD as Consulting Physician (Hematology and Oncology) Debbora Dus, [facility] as Pharmacist (Pharmacist)  ASSESSMENT & PLAN:  Iron deficiency anemia Iron studies are still pending I will call the patient once results are available If his ferritin level is less than 50, I will prescribe further intravenous iron infusion I do not recommend the patient to take oral iron supplement as he has poor tolerance to oral iron in the past I told the patient, with the degree of anemia observed today, it is not the reason of his excessive fatigue or persistent pain after his knee surgery, and certainly not the cause of delay wound healing  MGUS (monoclonal gammopathy of unknown significance) His last myeloma panel was drawn in September and it was borderline abnormal His panel is drawn accidentally today I will cancel it and will repeated in September   Orders Placed This Encounter  Procedures  . Kappa/lambda light chains    Standing Status:   Future    Standing Expiration Date:   05/19/2021  . Multiple Myeloma Panel (SPEP&IFE w/QIG)    Standing Status:   Future    Standing Expiration Date:   05/19/2021    All questions were answered. The patient knows to call the clinic with any problems, questions or concerns. The total time spent in the appointment was 15 minutes encounter with patients including review of chart and various tests results, discussions about plan of care and coordination of care plan   Heath Lark, MD 04/14/2020 1:36 PM  INTERVAL HISTORY: Please see below for problem oriented charting. He is seen urgently per patient request because of excessive fatigue, wondering whether he needs further iron infusion Since his surgery, he has  not been doing well He has significant pain He stopped taking his pain medicine 2 weeks ago because it was not helpful He was undergoing physical therapy at home but that exacerbate his pain Currently, he has difficulties bending his knee He has appointment to see orthopedic doctor soon He is asking whether his iron deficiency is contributing to his feeling of excessive fatigue or poor wound healing He denies recent bleeding He was prescribed oral iron but he did not take it because of poor tolerance  SUMMARY OF ONCOLOGIC HISTORY:  Evin Loiseau was transferred to my care after his prior physician has left.  I reviewed the patient's records extensive and collaborated the history with the patient. 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 He was initially evaluated and treated by a hematologist and a neurologist in [place] before he moved to [place]. 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 He had bone marrow aspirate and biopsy which showed no evidence of malignancy in his bone marrow. His neurologic condition is characterized primarily by painful distal neuropathy, feet worse than hands, and lower extremity weakness. At one point he could barely walk a block without having to stop due to pain and weakness. He was given an initial trial of Rituxan in 2010 with no significant improvement. However following reinitiation of Rituxan in April 2012 he did get a progressive improvement. He was  put on a maintenance program which we have continued in this office on an every other month basis through 03/23/2013.  The patient have new onset atrial fibrillation. His echocardiogram shows significant dilated atrium without evidence of increase ventricular wall thickness. 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 Repeat bone marrow aspirate and biopsy on 08/04/2018 was negative for malignancy He had CT imaging study of the abdomen and pelvis in 2019 and recent CT scan of the chest in May 2020 which show no evidence of lymphoma In February 2021, he received intravenous iron infusion prior to his knee surgery  REVIEW OF SYSTEMS:   Constitutional: Denies fevers, chills or abnormal weight loss Eyes: Denies blurriness of vision Ears, nose, mouth, throat, and face: Denies mucositis or sore throat Respiratory: Denies cough, dyspnea or wheezes Cardiovascular: Denies palpitation, chest discomfort or lower extremity swelling Gastrointestinal:  Denies nausea, heartburn or change in bowel habits Skin: Denies abnormal skin rashes Lymphatics: Denies new lymphadenopathy or easy bruising Neurological:Denies numbness, tingling or new weaknesses Behavioral/Psych: Mood is stable, no new changes  All other systems were reviewed with the patient and are negative.  I have reviewed the past medical history, past surgical history, social history and family history with the patient and they are unchanged from previous note.  ALLERGIES:  is allergic to miralax [polyethylene glycol] and nsaids.  MEDICATIONS:  Current Outpatient Medications  Medication Sig Dispense Refill  . albuterol (VENTOLIN HFA) 108 (90 Base) MCG/ACT inhaler Inhale 2 puffs into the lungs every 6 (six) hours as needed for wheezing or shortness of breath. 8 g 2  . amLODipine (NORVASC) 5 MG tablet Take 5 mg by mouth daily.     [name] aspirin EC 81 MG tablet Take 1 tablet (81 mg total) by mouth daily. 150 tablet 2  . bimatoprost (LUMIGAN) 0.01 % SOLN Place 1 drop into both eyes at bedtime.    . Cholecalciferol 25 MCG (1000 UT) tablet Take 1,000 Units by mouth daily.     [name] docusate sodium (COLACE) 100 MG capsule Take 1 capsule (100 mg total) by mouth 2 (two) times daily. 28 capsule 0  . dorzolamide-timolol (COSOPT) 22.3-6.8  MG/ML ophthalmic solution Place 1 drop into both eyes 2 (two) times daily.     [name] ELIQUIS 5 MG TABS tablet Take 1 tablet by mouth twice daily 180 tablet 2  . finasteride (PROSCAR) 5 MG tablet Take 5 mg by mouth daily after breakfast.     . fluticasone (FLONASE) 50 MCG/ACT nasal spray Place 2 sprays into both nostrils 2 (two) times daily.    . Fluticasone Propionate, Inhal, (FLOVENT DISKUS) 100 MCG/BLIST AEPB Inhale 1 Inhaler into the lungs 2 (two) times daily. (Patient taking differently: Inhale 1 Inhaler into the lungs 2 (two) times daily as needed (shortness of breath/wheezing). ) 60 each 2  . MULTAQ 400 MG tablet TAKE 1 TABLET BY MOUTH TWICE DAILY WITH MEALS 180 tablet 3  . Multiple Vitamin (MULTIVITAMIN WITH MINERALS) TABS tablet Take 1 tablet by mouth daily.    . pantoprazole (PROTONIX) 40 MG tablet Take 1 tablet by mouth once daily 90 tablet 2  . Polyethyl Glycol-Propyl Glycol (SYSTANE OP) Place 1 drop into both eyes daily as needed (dry eyes).    . rosuvastatin (CRESTOR) 20 MG tablet Take 1 tablet (20 mg total) by mouth daily. 90 tablet 3  . tadalafil (CIALIS) 5 MG tablet Take 5 mg by mouth at bedtime.     . triamterene-hydrochlorothiazide (MAXZIDE-25) 37.5-25 MG tablet Take  1 tablet by mouth once daily 90 tablet 3   No current facility-administered medications for this visit.    PHYSICAL EXAMINATION: ECOG PERFORMANCE STATUS: 2 - Symptomatic, <50% confined to bed  Vitals:   04/14/20 1257  BP: 124/65  Pulse: 84  Resp: 18  Temp: 98.5 F (36.9 C)  SpO2: 100%   Filed Weights   04/14/20 1257  Weight: 178 lb (80.7 kg)    GENERAL:alert, no distress and comfortable NEURO: alert & oriented x 3 with fluent speech, no focal motor/sensory deficits  LABORATORY DATA:  I have reviewed the data as listed    Component Value Date/Time   NA 139 04/14/2020 1221   NA 141 05/29/2018 0806   NA 141 07/27/2016 0748   K 3.9 04/14/2020 1221   K 3.9 07/27/2016 0748   CL 103 04/14/2020 1221    CL 104 03/23/2013 0855   CO2 28 04/14/2020 1221   CO2 30 (H) 07/27/2016 0748   GLUCOSE 107 (H) 04/14/2020 1221   GLUCOSE 122 07/27/2016 0748   GLUCOSE 98 03/23/2013 0855   BUN 17 04/14/2020 1221   BUN 14 05/29/2018 0806   BUN 16.0 07/27/2016 0748   CREATININE 0.95 04/14/2020 1221   CREATININE 1.03 02/01/2020 1520   CREATININE 1.0 07/27/2016 0748   CALCIUM 9.8 04/14/2020 1221   CALCIUM 10.5 (H) 07/27/2016 0748   PROT 7.2 04/14/2020 1221   PROT 6.7 01/22/2020 0731   PROT 7.5 07/27/2016 0748   ALBUMIN 4.2 04/14/2020 1221   ALBUMIN 4.4 01/22/2020 0731   ALBUMIN 4.3 07/27/2016 0748   AST 19 04/14/2020 1221   AST 24 07/27/2016 0748   ALT 14 04/14/2020 1221   ALT 19 07/27/2016 0748   ALKPHOS 57 04/14/2020 1221   ALKPHOS 53 07/27/2016 0748   BILITOT 0.5 04/14/2020 1221   BILITOT 0.4 01/22/2020 0731   BILITOT 0.84 07/27/2016 0748   GFRNONAA >60 04/14/2020 1221   GFRAA >60 04/14/2020 1221    No results found for: SPEP, UPEP  Lab Results  Component Value Date   WBC 3.4 (L) 04/14/2020   NEUTROABS 2.2 04/14/2020   HGB 11.7 (L) 04/14/2020   HCT 36.5 (L) 04/14/2020   MCV 88.6 04/14/2020   PLT 179 04/14/2020      Chemistry      Component Value Date/Time   NA 139 04/14/2020 1221   NA 141 05/29/2018 0806   NA 141 07/27/2016 0748   K 3.9 04/14/2020 1221   K 3.9 07/27/2016 0748   CL 103 04/14/2020 1221   CL 104 03/23/2013 0855   CO2 28 04/14/2020 1221   CO2 30 (H) 07/27/2016 0748   BUN 17 04/14/2020 1221   BUN 14 05/29/2018 0806   BUN 16.0 07/27/2016 0748   CREATININE 0.95 04/14/2020 1221   CREATININE 1.03 02/01/2020 1520   CREATININE 1.0 07/27/2016 0748      Component Value Date/Time   CALCIUM 9.8 04/14/2020 1221   CALCIUM 10.5 (H) 07/27/2016 0748   ALKPHOS 57 04/14/2020 1221   ALKPHOS 53 07/27/2016 0748   AST 19 04/14/2020 1221   AST 24 07/27/2016 0748   ALT 14 04/14/2020 1221   ALT 19 07/27/2016 0748   BILITOT 0.5 04/14/2020 1221   BILITOT 0.4 01/22/2020 0731    BILITOT 0.84 07/27/2016 0748       RADIOGRAPHIC STUDIES: I have personally reviewed the radiological images as listed and agreed with the findings in the report. CUP PACEART INCLINIC DEVICE CHECK  Result Date: 03/28/2020 Pacemaker  check in clinic with industry. Normal device function. Thresholds, sensing, impedances consistent with previous measurements. Device programmed to maximize longevity. No mode switch or high ventricular rates noted. Device programmed at appropriate safety margins. Histogram distribution appropriate for patient activity level, patient indicates decline in activity level following recent knee replacement surgery.  Estimated longevity 5 years. Patient enrolled in remote follow-up. Next remote transmission 06/23/20. ROV with WC in 6 months.  Patient education completed.Trena Platt, BSN RN  CUP PACEART REMOTE DEVICE CHECK  Result Date: 03/24/2020 Scheduled remote reviewed. Normal device function.  Next remote 91 days. Felisa Bonier, RN, MSN

## 2020-04-14 NOTE — Assessment & Plan Note (Signed)
Iron studies are still pending I will call the patient once results are available If his ferritin level is less than 50, I will prescribe further intravenous iron infusion I do not recommend the patient to take oral iron supplement as he has poor tolerance to oral iron in the past I told the patient, with the degree of anemia observed today, it is not the reason of his excessive fatigue or persistent pain after his knee surgery, and certainly not the cause of delay wound healing

## 2020-04-15 ENCOUNTER — Telehealth: Payer: Self-pay | Admitting: Hematology and Oncology

## 2020-04-15 ENCOUNTER — Ambulatory Visit: Payer: Medicare Other | Admitting: Family Medicine

## 2020-04-15 ENCOUNTER — Telehealth: Payer: Self-pay

## 2020-04-15 DIAGNOSIS — M25661 Stiffness of right knee, not elsewhere classified: Secondary | ICD-10-CM | POA: Diagnosis not present

## 2020-04-15 DIAGNOSIS — M25561 Pain in right knee: Secondary | ICD-10-CM | POA: Diagnosis not present

## 2020-04-15 LAB — KAPPA/LAMBDA LIGHT CHAINS
Kappa free light chain: 25.9 mg/L — ABNORMAL HIGH (ref 3.3–19.4)
Kappa, lambda light chain ratio: 1.63 (ref 0.26–1.65)
Lambda free light chains: 15.9 mg/L (ref 5.7–26.3)

## 2020-04-15 NOTE — Telephone Encounter (Signed)
Called and given below message. He verbalized understanding. 

## 2020-04-15 NOTE — Telephone Encounter (Signed)
No 4/19 los/sch msg. No changes made to pt's schedule.  

## 2020-04-15 NOTE — Telephone Encounter (Signed)
-----   Message from Heath Lark, MD sent at 04/15/2020  9:03 AM EDT ----- Regarding: pls let him know iron studies are ok and ferritin over 100, no need IV iron

## 2020-04-17 DIAGNOSIS — M25561 Pain in right knee: Secondary | ICD-10-CM | POA: Diagnosis not present

## 2020-04-17 DIAGNOSIS — M25661 Stiffness of right knee, not elsewhere classified: Secondary | ICD-10-CM | POA: Diagnosis not present

## 2020-04-18 ENCOUNTER — Telehealth: Payer: Self-pay | Admitting: *Deleted

## 2020-04-18 LAB — MULTIPLE MYELOMA PANEL, SERUM
Albumin SerPl Elph-Mcnc: 4.1 g/dL (ref 2.9–4.4)
Albumin/Glob SerPl: 1.6 (ref 0.7–1.7)
Alpha 1: 0.2 g/dL (ref 0.0–0.4)
Alpha2 Glob SerPl Elph-Mcnc: 0.7 g/dL (ref 0.4–1.0)
B-Globulin SerPl Elph-Mcnc: 0.9 g/dL (ref 0.7–1.3)
Gamma Glob SerPl Elph-Mcnc: 0.9 g/dL (ref 0.4–1.8)
Globulin, Total: 2.7 g/dL (ref 2.2–3.9)
IgA: 203 mg/dL (ref 61–437)
IgG (Immunoglobin G), Serum: 764 mg/dL (ref 603–1613)
IgM (Immunoglobulin M), Srm: 359 mg/dL — ABNORMAL HIGH (ref 15–143)
Total Protein ELP: 6.8 g/dL (ref 6.0–8.5)

## 2020-04-18 NOTE — Telephone Encounter (Signed)
Made x2 attempts to call pt. Awaiting callback

## 2020-04-19 ENCOUNTER — Other Ambulatory Visit: Payer: Self-pay | Admitting: Cardiovascular Disease

## 2020-04-21 ENCOUNTER — Telehealth: Payer: Self-pay

## 2020-04-21 NOTE — Telephone Encounter (Signed)
Called and given below message. He verbalized understanding. He would like to cancel September appts. [place] message sent to reschedule to next April.

## 2020-04-21 NOTE — Telephone Encounter (Signed)
-----   Message from Heath Lark, MD sent at 04/18/2020  7:48 AM EDT ----- Regarding: MGUS panel and appt Despite my attempt to cancel his MGUS panel, labs did not do so and was unable to cancel them His MGUS is stable If ok with patient, I suggest cancelling his appt in Sept and move his appt to next April He only needs to be seen once a year

## 2020-04-22 ENCOUNTER — Other Ambulatory Visit: Payer: Self-pay

## 2020-04-22 DIAGNOSIS — M25561 Pain in right knee: Secondary | ICD-10-CM | POA: Diagnosis not present

## 2020-04-22 DIAGNOSIS — M25661 Stiffness of right knee, not elsewhere classified: Secondary | ICD-10-CM | POA: Diagnosis not present

## 2020-04-22 MED ORDER — TRIAMTERENE-HCTZ 37.5-25 MG PO TABS: 1.0000 | ORAL_TABLET | Freq: Every day | ORAL | 3 refills | Status: DC

## 2020-04-22 MED ORDER — AMLODIPINE BESYLATE 5 MG PO TABS: 5.0000 mg | ORAL_TABLET | Freq: Every day | ORAL | 3 refills | Status: DC

## 2020-04-24 DIAGNOSIS — M25561 Pain in right knee: Secondary | ICD-10-CM | POA: Diagnosis not present

## 2020-04-24 DIAGNOSIS — M25661 Stiffness of right knee, not elsewhere classified: Secondary | ICD-10-CM | POA: Diagnosis not present

## 2020-04-29 DIAGNOSIS — M25661 Stiffness of right knee, not elsewhere classified: Secondary | ICD-10-CM | POA: Diagnosis not present

## 2020-04-29 DIAGNOSIS — M25561 Pain in right knee: Secondary | ICD-10-CM | POA: Diagnosis not present

## 2020-05-01 DIAGNOSIS — M25561 Pain in right knee: Secondary | ICD-10-CM | POA: Diagnosis not present

## 2020-05-01 DIAGNOSIS — M25661 Stiffness of right knee, not elsewhere classified: Secondary | ICD-10-CM | POA: Diagnosis not present

## 2020-05-06 DIAGNOSIS — M25661 Stiffness of right knee, not elsewhere classified: Secondary | ICD-10-CM | POA: Diagnosis not present

## 2020-05-06 DIAGNOSIS — M25561 Pain in right knee: Secondary | ICD-10-CM | POA: Diagnosis not present

## 2020-05-08 DIAGNOSIS — M25561 Pain in right knee: Secondary | ICD-10-CM | POA: Diagnosis not present

## 2020-05-08 DIAGNOSIS — M25661 Stiffness of right knee, not elsewhere classified: Secondary | ICD-10-CM | POA: Diagnosis not present

## 2020-05-13 DIAGNOSIS — M25661 Stiffness of right knee, not elsewhere classified: Secondary | ICD-10-CM | POA: Diagnosis not present

## 2020-05-13 DIAGNOSIS — M25561 Pain in right knee: Secondary | ICD-10-CM | POA: Diagnosis not present

## 2020-05-15 DIAGNOSIS — M25661 Stiffness of right knee, not elsewhere classified: Secondary | ICD-10-CM | POA: Diagnosis not present

## 2020-05-15 DIAGNOSIS — M25561 Pain in right knee: Secondary | ICD-10-CM | POA: Diagnosis not present

## 2020-05-20 DIAGNOSIS — M25561 Pain in right knee: Secondary | ICD-10-CM | POA: Diagnosis not present

## 2020-05-20 DIAGNOSIS — M25661 Stiffness of right knee, not elsewhere classified: Secondary | ICD-10-CM | POA: Diagnosis not present

## 2020-05-21 ENCOUNTER — Other Ambulatory Visit: Payer: Self-pay

## 2020-05-21 ENCOUNTER — Other Ambulatory Visit: Payer: Medicare Other | Admitting: *Deleted

## 2020-05-21 ENCOUNTER — Ambulatory Visit (HOSPITAL_COMMUNITY): Payer: Medicare Other | Attending: Cardiology

## 2020-05-21 DIAGNOSIS — E782 Mixed hyperlipidemia: Secondary | ICD-10-CM

## 2020-05-21 DIAGNOSIS — I480 Paroxysmal atrial fibrillation: Secondary | ICD-10-CM | POA: Insufficient documentation

## 2020-05-21 DIAGNOSIS — I350 Nonrheumatic aortic (valve) stenosis: Secondary | ICD-10-CM | POA: Insufficient documentation

## 2020-05-21 LAB — LIPID PANEL
Chol/HDL Ratio: 2.2 ratio (ref 0.0–5.0)
Cholesterol, Total: 147 mg/dL (ref 100–199)
HDL: 66 mg/dL (ref >39)
LDL Chol Calc (NIH): 71 mg/dL (ref 0–99)
Triglycerides: 43 mg/dL (ref 0–149)
VLDL Cholesterol Cal: 10 mg/dL (ref 5–40)

## 2020-05-21 LAB — HEPATIC FUNCTION PANEL
ALT: 11 IU/L (ref 0–44)
AST: 22 IU/L (ref 0–40)
Albumin: 4.3 g/dL (ref 3.7–4.7)
Alkaline Phosphatase: 54 IU/L (ref 48–121)
Bilirubin Total: 0.4 mg/dL (ref 0.0–1.2)
Bilirubin, Direct: 0.15 mg/dL (ref 0.00–0.40)
Total Protein: 6.6 g/dL (ref 6.0–8.5)

## 2020-05-22 DIAGNOSIS — M25661 Stiffness of right knee, not elsewhere classified: Secondary | ICD-10-CM | POA: Diagnosis not present

## 2020-05-22 DIAGNOSIS — M25561 Pain in right knee: Secondary | ICD-10-CM | POA: Diagnosis not present

## 2020-05-28 DIAGNOSIS — Z96651 Presence of right artificial knee joint: Secondary | ICD-10-CM | POA: Diagnosis not present

## 2020-05-28 DIAGNOSIS — M7061 Trochanteric bursitis, right hip: Secondary | ICD-10-CM | POA: Diagnosis not present

## 2020-05-28 DIAGNOSIS — Z471 Aftercare following joint replacement surgery: Secondary | ICD-10-CM | POA: Diagnosis not present

## 2020-06-12 ENCOUNTER — Telehealth: Payer: Medicare Other

## 2020-06-17 ENCOUNTER — Telehealth: Payer: Self-pay

## 2020-06-17 NOTE — Telephone Encounter (Signed)
Patient contacted the office and states that he was speaking to the Access Nurse line, and they state they advised patient that he needed to be seen within 4 hours. Patient states he believes he has issues with anemia, and states he has just been feeling dizzy on and off, and having fatigue, and feeling cold all the time.  When pt was transferred to [place], no appts were available within the office. Pt was scheduled for 8:20 tomorrow morning with Dr. Lorelei Pont. ER/UC precautions were given.

## 2020-06-17 NOTE — Telephone Encounter (Signed)
[facility] - Client TELEPHONE ADVICE RECORD AccessNurse Patient Name: Derrick [name] Gender: Male DOB: June 19, 1941 Age: 79 Y 2 M 16 D Return Phone Number: [phone] (Primary), [phone] (Secondary) Address: City/State/Zip: [place] [address] Client [facility] - Client Client Site [facility] - Day Physician [name] - MD Contact Type Call Who Is Calling Patient / Member / Family / Caregiver Call Type Triage / Clinical Relationship To Patient Self Return Phone Number [phone] (Primary) Chief Complaint Weakness, Generalized Reason for Call Symptomatic / Request for [place] states he has been experiencing weakness, head fogginess, loss of appetite and signs of anemia. Translation No Nurse Assessment Nurse: Rock Nephew, RN, Juliann Pulse Date/Time (Eastern Time): 06/17/2020 1:57:05 PM Confirm and document reason for call. If symptomatic, describe symptoms. ---Caller states he has been experiencing weakness, fatigue, head fogginess, loss of appetite and signs of anemia. Has the patient had close contact with a person known or suspected to have the novel coronavirus illness OR traveled / lives in area with major community spread (including international travel) in the last 14 days from the onset of symptoms? * If Asymptomatic, screen for exposure and travel within the last 14 days. ---No Does the patient have any new or worsening symptoms? ---Yes Will a triage be completed? ---Yes Related visit to physician within the last 2 weeks? ---No Does the PT have any chronic conditions? (i.e. diabetes, asthma, this includes High risk factors for pregnancy, etc.) ---Yes List chronic conditions. ---Anemia prior to Knee replacement in March ( Iron infusion x2) , Hx pulmonary sarcoidosis, HTN Is this a behavioral health or substance abuse call? ---No Guidelines Guideline Title  Affirmed Question Affirmed Notes Nurse Date/Time (Eastern Time) Weakness (Generalized) and Fatigue [1] MODERATE weakness (i.e., interferes with work, school, normal activities) AND [2] cause unknown (Exceptions: weakness Wells, RN, Juliann Pulse 06/17/2020 1:59:07 PM PLEASE NOTE: All timestamps contained within this report are represented as [place] Standard Time. CONFIDENTIALTY NOTICE: This fax transmission is intended only for the addressee. It contains information that is legally privileged, confidential or otherwise protected from use or disclosure. If you are not the intended recipient, you are strictly prohibited from reviewing, disclosing, copying using or disseminating any of this information or taking any action in reliance on or regarding this information. If you have received this fax in error, please notify us immediately by telephone so that we can arrange for its return to [place]. Phone: [phone], Toll-Free: [phone], Fax: [phone] Page: 2 of 2 Call Id: [id number] Guidelines Guideline Title Affirmed Question Affirmed Notes Nurse Date/Time Eilene Ghazi Time) with acute minor illness, or weakness from poor fluid intake) Disp. Time Eilene Ghazi Time) Disposition Final User 06/17/2020 2:01:38 PM See HCP within 4 Hours (or PCP triage) Yes Rock Nephew, RN, Gara Kroner Disagree/Comply Comply Caller Understands Yes PreDisposition Call Doctor Care Advice Given Per Guideline SEE HCP WITHIN 4 HOURS (OR PCP TRIAGE): * IF OFFICE WILL BE OPEN: You need to be seen within the next 3 or 4 hours. Call your doctor (or NP/PA) now or as soon as the office opens. CALL BACK IF: * You become worse. CARE ADVICE given per Weakness and Fatigue (Adult) guideline. Comments User: Valetta Mole, RN Date/Time Eilene Ghazi Time): 06/17/2020 2:20:03 PM I was unable to reach anyone at the back line to schedule appt for this patient. I waited over 7 minutes on the regular office number to help schedule an appt for this  patient but was unable  to hold any longer. I advised patient I was unable to hold any longer but that I would transfer him and he was to advise them I recommended he be seen in the next 4 hours. He verbalized understanding. Referrals Warm transfer to [facility] PCP OFFICE

## 2020-06-17 NOTE — Telephone Encounter (Signed)
Brooke CMA spoke with pt about appt.

## 2020-06-17 NOTE — Telephone Encounter (Signed)
Please see triage note

## 2020-06-18 ENCOUNTER — Encounter: Payer: Self-pay | Admitting: Family Medicine

## 2020-06-18 ENCOUNTER — Ambulatory Visit (INDEPENDENT_AMBULATORY_CARE_PROVIDER_SITE_OTHER): Payer: Medicare Other | Admitting: Family Medicine

## 2020-06-18 ENCOUNTER — Other Ambulatory Visit: Payer: Self-pay

## 2020-06-18 VITALS — BP 118/64 | HR 74 | Temp 97.5°F | Ht 69.0 in | Wt 171.5 lb

## 2020-06-18 DIAGNOSIS — R634 Abnormal weight loss: Secondary | ICD-10-CM

## 2020-06-18 DIAGNOSIS — Z951 Presence of aortocoronary bypass graft: Secondary | ICD-10-CM

## 2020-06-18 DIAGNOSIS — E559 Vitamin D deficiency, unspecified: Secondary | ICD-10-CM | POA: Diagnosis not present

## 2020-06-18 DIAGNOSIS — R5383 Other fatigue: Secondary | ICD-10-CM | POA: Diagnosis not present

## 2020-06-18 DIAGNOSIS — D649 Anemia, unspecified: Secondary | ICD-10-CM

## 2020-06-18 DIAGNOSIS — D869 Sarcoidosis, unspecified: Secondary | ICD-10-CM

## 2020-06-18 DIAGNOSIS — D508 Other iron deficiency anemias: Secondary | ICD-10-CM | POA: Diagnosis not present

## 2020-06-18 DIAGNOSIS — Z85038 Personal history of other malignant neoplasm of large intestine: Secondary | ICD-10-CM | POA: Diagnosis not present

## 2020-06-18 DIAGNOSIS — Z950 Presence of cardiac pacemaker: Secondary | ICD-10-CM

## 2020-06-18 DIAGNOSIS — D472 Monoclonal gammopathy: Secondary | ICD-10-CM

## 2020-06-18 DIAGNOSIS — I480 Paroxysmal atrial fibrillation: Secondary | ICD-10-CM

## 2020-06-18 DIAGNOSIS — I25810 Atherosclerosis of coronary artery bypass graft(s) without angina pectoris: Secondary | ICD-10-CM | POA: Diagnosis not present

## 2020-06-18 DIAGNOSIS — E538 Deficiency of other specified B group vitamins: Secondary | ICD-10-CM

## 2020-06-18 DIAGNOSIS — Z79899 Other long term (current) drug therapy: Secondary | ICD-10-CM | POA: Diagnosis not present

## 2020-06-18 LAB — CBC WITH DIFFERENTIAL/PLATELET
Basophils Absolute: 0 10*3/uL (ref 0.0–0.1)
Basophils Relative: 0.8 % (ref 0.0–3.0)
Eosinophils Absolute: 0.6 10*3/uL (ref 0.0–0.7)
Eosinophils Relative: 10.1 % — ABNORMAL HIGH (ref 0.0–5.0)
HCT: 36.5 % — ABNORMAL LOW (ref 39.0–52.0)
Hemoglobin: 12.4 g/dL — ABNORMAL LOW (ref 13.0–17.0)
Lymphocytes Relative: 10.6 % — ABNORMAL LOW (ref 12.0–46.0)
Lymphs Abs: 0.6 10*3/uL — ABNORMAL LOW (ref 0.7–4.0)
MCHC: 34.1 g/dL (ref 30.0–36.0)
MCV: 89.2 fl (ref 78.0–100.0)
Monocytes Absolute: 0.7 10*3/uL (ref 0.1–1.0)
Monocytes Relative: 11.4 % (ref 3.0–12.0)
Neutro Abs: 4.1 10*3/uL (ref 1.4–7.7)
Neutrophils Relative %: 67.1 % (ref 43.0–77.0)
Platelets: 173 10*3/uL (ref 150.0–400.0)
RBC: 4.09 Mil/uL — ABNORMAL LOW (ref 4.22–5.81)
RDW: 14.7 % (ref 11.5–15.5)
WBC: 6.1 10*3/uL (ref 4.0–10.5)

## 2020-06-18 LAB — BASIC METABOLIC PANEL
BUN: 15 mg/dL (ref 6–23)
CO2: 29 mEq/L (ref 19–32)
Calcium: 10.6 mg/dL — ABNORMAL HIGH (ref 8.4–10.5)
Chloride: 98 mEq/L (ref 96–112)
Creatinine, Ser: 0.89 mg/dL (ref 0.40–1.50)
GFR: 82.84 mL/min (ref >60.00)
Glucose, Bld: 101 mg/dL — ABNORMAL HIGH (ref 70–99)
Potassium: 4.1 mEq/L (ref 3.5–5.1)
Sodium: 135 mEq/L (ref 135–145)

## 2020-06-18 LAB — VITAMIN B12: Vitamin B-12: 687 pg/mL (ref 211–911)

## 2020-06-18 LAB — TSH: TSH: 2.06 u[IU]/mL (ref 0.35–4.50)

## 2020-06-18 LAB — HEPATIC FUNCTION PANEL
ALT: 12 U/L (ref 0–53)
AST: 15 U/L (ref 0–37)
Albumin: 4.2 g/dL (ref 3.5–5.2)
Alkaline Phosphatase: 59 U/L (ref 39–117)
Bilirubin, Direct: 0.2 mg/dL (ref 0.0–0.3)
Total Bilirubin: 0.9 mg/dL (ref 0.2–1.2)
Total Protein: 6.7 g/dL (ref 6.0–8.3)

## 2020-06-18 LAB — IBC + FERRITIN
Ferritin: 41.7 ng/mL (ref 22.0–322.0)
Iron: 36 ug/dL — ABNORMAL LOW (ref 42–165)
Saturation Ratios: 7.7 % — ABNORMAL LOW (ref 20.0–50.0)
Transferrin: 332 mg/dL (ref 212.0–360.0)

## 2020-06-18 LAB — T3, FREE: T3, Free: 2.8 pg/mL (ref 2.3–4.2)

## 2020-06-18 LAB — T4, FREE: Free T4: 1.18 ng/dL (ref 0.60–1.60)

## 2020-06-18 LAB — VITAMIN D 25 HYDROXY (VIT D DEFICIENCY, FRACTURES): VITD: 45.37 ng/mL (ref 30.00–100.00)

## 2020-06-18 NOTE — Progress Notes (Signed)
Derrick [name] T. Yaneth Fairbairn, MD, [place] [facility] [place], [address]  Phone: [phone]  FAX: [phone]  [name] - 79 y.o. male  MRN [id number]  Date of Birth: March 10, 1941  Date: 06/18/2020  PCP: [name], MD  Referral: [name], MD  Chief Complaint  Patient presents with  . Fatigue    brain fog, wants to sleep all the time, weight loss    This visit occurred during the SARS-CoV-2 public health emergency.  Safety protocols were in place, including screening questions prior to the visit, additional usage of staff PPE, and extensive cleaning of exam room while observing appropriate contact time as indicated for disinfecting solutions.   Subjective:   Derrick [name] is a 79 y.o. very pleasant male patient with Body mass index is 25.33 kg/m. who presents with the following:  This patient has an exceedingly complicated medical history, and I was asked to see him urgently today.  I have never met this patient before.  Fatigue, brain fog, weight loss, multiple other issues.  He has lost 10 pounds since March 30.  He has lost 19 pounds since 18 months ago.  Wt Readings from Last 3 Encounters:  06/18/20 171 lb 8 oz (77.8 kg)  04/14/20 178 lb (80.7 kg)  03/25/20 181 lb (82.1 kg)    18 months ago, 190  It looks like he has seen Heme/Onc recently. Cardiology and EP doctors. All of which she is seen recently. He had a recent echocardiogram.  He did have normal systolic function.  Panlab  3 major surgeries in the last year June 2020 had tripl bpass May had a TKA  Does colonoscopy regularly  He does have a history of myocardial infarction as well as CABG x3.  He also has a history of significant iron deficiency anemia, he could not tolerate oral iron per his report any ultimately required to infusions of IV iron prior to his total knee arthroplasty.  He  does note that he has been tired much of the time and feels fatigued  Low hemoglobin,  Increased by IV iron  7-10 days has lost his appetite.   04/16/2020, anemic and normal ferritin  ? Microscopic blood in his urine S/p cystoscopy This is also been evaluated with a renal protocol CT  MI, CABG Colon CA, he also has a history of colon cancer, he is monitored by oncology and he has regular colonoscopies.  Stays cold all of the time  ORTHOSTATICS -  In chart, not normal place EKG - unchanged  ? Low BP, d 40's to 50's at home  Starting at 8:30 onward on the exam  Review of Systems is noted in the HPI, as appropriate  Objective:   Vitals:   06/18/20 0827  BP: 118/64  Pulse: 74  Temp: (!) 97.5 F (36.4 C)  TempSrc: Skin  SpO2: 98%  Weight: 171 lb 8 oz (77.8 kg)  Height: _0  (1.753 m)   BP 118/64   Pulse 74   Temp (!) 97.5 F (36.4 C) (Skin)   Ht _1  (1.753 m)   Wt 171 lb 8 oz (77.8 kg)   SpO2 98%   BMI 25.33 kg/m    Orthostatic Vitals for the past 48 hrs (Last 6 readings):  Orthostatic BP  06/18/20 0920 126/64  06/18/20 0921 114/60  06/18/20 0922 108/58   Unfortunately, pulses were not  obtained during orthostasic check.   GEN: WDWN, NAD, Non-toxic HEENT: Atraumatic, Normocephalic. Neck supple. No masses. CV: RRR, he does have a loud murmur.  No gallops or rubs.   PULM: CTA B, no wheezes, crackles, rhonchi. No retractions. No resp. distress. No accessory muscle use. EXTR: No c/c/e NEURO Normal gait.  PSYCH: Normally interactive. Conversant.    Laboratory and Imaging Data: Results for orders placed or performed in visit on 05/21/20  Lipid panel  Result Value Ref Range   Cholesterol, Total 147 100 - 199 mg/dL   Triglycerides 43 0 - 149 mg/dL   HDL 66 >39 mg/dL   VLDL Cholesterol Cal 10 5 - 40 mg/dL   LDL Chol Calc (NIH) 71 0 - 99 mg/dL   Chol/HDL Ratio 2.2 0.0 - 5.0 ratio  Hepatic function panel  Result Value Ref Range   Total Protein 6.6 6.0  - 8.5 g/dL   Albumin 4.3 3.7 - 4.7 g/dL   Bilirubin Total 0.4 0.0 - 1.2 mg/dL   Bilirubin, Direct 0.15 0.00 - 0.40 mg/dL   Alkaline Phosphatase 54 48 - 121 IU/L   AST 22 0 - 40 IU/L   ALT 11 0 - 44 IU/L    Lab Review:  CBC EXTENDED Latest Ref Rng & Units 04/14/2020 03/12/2020 03/07/2020  WBC 4.0 - 10.5 K/uL 3.4(L) 7.4 3.5(L)  RBC 4.22 - 5.81 MIL/uL 4.12(L) 3.59(L) 4.13(L)  HGB 13.0 - 17.0 g/dL 11.7(L) 10.0(L) 11.1(L)  HCT 39 - 52 % 36.5(L) 31.3(L) 35.7(L)  PLT 150 - 400 K/uL 179 164 178  NEUTROABS 1.7 - 7.7 K/uL 2.2 - 2.1  LYMPHSABS 0.7 - 4.0 K/uL 0.8 - 0.8    BMP Latest Ref Rng & Units 04/14/2020 03/12/2020 03/04/2020  Glucose 70 - 99 mg/dL 107(H) 142(H) 85  BUN 8 - 23 mg/dL _0 Creatinine 0.61 - 1.24 mg/dL 0.95 0.92 0.99  BUN/Creat Ratio 6 - 22 (calc) - - -  Sodium 135 - 145 mmol/L 139 134(L) 138  Potassium 3.5 - 5.1 mmol/L 3.9 3.6 3.6  Chloride 98 - 111 mmol/L 103 100 103  CO2 22 - 32 mmol/L _1 Calcium 8.9 - 10.3 mg/dL 9.8 9.5 9.9    Hepatic Function Latest Ref Rng & Units 05/21/2020 04/14/2020 02/01/2020  Total Protein 6.0 - 8.5 g/dL 6.6 7.2 6.9  Albumin 3.7 - 4.7 g/dL 4.3 4.2 -  AST 0 - 40 IU/L _2 ALT 0 - 44 IU/L _3 Alk Phosphatase 48 - 121 IU/L 54 57 -  Total Bilirubin 0.0 - 1.2 mg/dL 0.4 0.5 0.5  Bilirubin, Direct 0.00 - 0.40 mg/dL 0.15 - -    Lab Results  Component Value Date   CHOL 147 05/21/2020   Lab Results  Component Value Date   HDL 66 05/21/2020   Lab Results  Component Value Date   LDLCALC 71 05/21/2020   Lab Results  Component Value Date   TRIG 43 05/21/2020   Lab Results  Component Value Date   CHOLHDL 2.2 05/21/2020   No results for input(s): PSA in the last 72 hours. No results found for: HCVAB Lab Results  Component Value Date   VD25OH 48.26 06/13/2018   VD25OH 42.93 02/16/2016     Lab Results  Component Value Date   HGBA1C 5.0 02/01/2020   HGBA1C 4.9 06/04/2019   HGBA1C 5.1 06/13/2018   Lab Results    Component Value Date   LDLCALC 71 05/21/2020  CREATININE 0.95 04/14/2020    CLINICAL DATA:  left lower quadrant pain.  Suspect diverticulitis.   EXAM: CT ABDOMEN AND PELVIS WITH CONTRAST   TECHNIQUE: Multidetector CT imaging of the abdomen and pelvis was performed using the standard protocol following bolus administration of intravenous contrast.   CONTRAST:  115m ISOVUE-300 IOPAMIDOL (ISOVUE-300) INJECTION 61%   COMPARISON:  None.   FINDINGS: Lower chest: Calcified granulomas identified within both lower lobes.   Hepatobiliary: No focal liver abnormality is seen. No gallstones, gallbladder wall thickening, or biliary dilatation.   Pancreas: Unremarkable. No pancreatic ductal dilatation or surrounding inflammatory changes.   Spleen: Normal in size without focal abnormality.   Adrenals/Urinary Tract: The adrenal glands are normal. Right kidney stone measures 6 mm, image 37/2. Cyst identified within the inferior pole of left kidney measures 1.9 cm, image 46/2. The urinary bladder is unremarkable.   Stomach/Bowel: The stomach is normal. The small bowel loops have a normal course and caliber without obstruction. No small bowel wall thickening or inflammation. Unremarkable appearance of the colon. No evidence for diverticulitis.   Vascular/Lymphatic: Aortic atherosclerosis. No aneurysm. No abdominopelvic adenopathy identified.   Reproductive: Prostate gland is enlarged measuring 5.7 x 6.0 by 6.6 cm (volume = 120 cm^3).   Other: There is no free fluid or fluid collections.   Musculoskeletal: Sclerotic focus within the right iliac wing measures 1.7 cm. Favor benign etiology, unchanged from 10/20/2015.   IMPRESSION: 1. No acute findings within the abdomen or pelvis. 2.  Aortic Atherosclerosis (ICD10-I70.0). 3. Nonobstructing right renal calculus. 4. Prostate gland enlargement.     Electronically Signed CLINICAL DATA:  Follow-up TAA, preoperative CABG    EXAM: CT CHEST WITH CONTRAST   TECHNIQUE: Multidetector CT imaging of the chest was performed during intravenous contrast administration.   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 Normal caliber of the sinuses of Valsalva, aortic valve, distal arch, and descending thoracic aorta. Mild mixed calcific atherosclerosis. The left brachiocephalic vein is 1.3 cm posterior to the manubrium. The aortic arch is 2.1 cm posterior to the sternal body. The right ventricle is 1.9 cm posterior to the sternal body. Cardiomegaly with extensive 3 vessel coronary artery calcifications and calcifications of the aortic valve.   Mediastinum/Nodes: No enlarged mediastinal, hilar, or axillary lymph nodes. Thyroid gland, trachea, and esophagus demonstrate no significant findings.   Lungs/Pleura: There are multiple stable small benign pulmonary nodules, some which are calcified, including a 5 mm nodule of the left pulmonary apex. No pleural effusion or pneumothorax.   Upper Abdomen: No acute abnormality.   Musculoskeletal: No chest wall mass or suspicious bone lesions identified. Disc degenerative disease and ankylosis of the thoracic spine.   IMPRESSION: 1. 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 Normal caliber of the sinuses of Valsalva, aortic valve, distal arch, and descending thoracic aorta. Mild mixed calcific atherosclerosis.   2. The left brachiocephalic vein is 1.3 cm posterior to the manubrium. The aortic arch is 2.1 cm posterior to the sternal body. The right ventricle is 1.9 cm posterior to the sternal  body.   3.  Coronary artery disease.   4.  Stable  benign pulmonary nodules bilaterally.     Electronically Signed   By: Eddie Candle M.D.   On: 05/24/2019 16:28 CLINICAL DATA:  Status post coronary bypass graft.   EXAM: CHEST - 2 VIEW   COMPARISON:  Radiograph of June 09, 2019.   FINDINGS: Stable cardiomediastinal silhouette. Status post coronary bypass graft. Atherosclerosis of thoracic aorta is noted. Left-sided pacemaker is noted. No pneumothorax or pleural effusion is noted. No acute pulmonary disease is noted. Bony thorax unremarkable.   IMPRESSION: No active cardiopulmonary disease.   Aortic Atherosclerosis (ICD10-I70.0).     Electronically Signed   By: Marijo Conception M.D.   On: 07/05/2019 15:05   Assessment and Plan:     ICD-10-CM   1. Fatigue, unspecified type  R53.83 IBC + Ferritin    Vitamin I33    Basic metabolic panel    Hepatic function panel    TSH    T3, free    T4, free  2. Coronary artery disease involving coronary bypass graft of native heart without angina pectoris  I25.810 EKG 12-Lead  3. History of colon cancer  Z85.038 Vitamin B12  4. Other iron deficiency anemia  D50.8 CBC with Differential/Platelet    IBC + Ferritin    Vitamin B12  5. Paroxysmal atrial fibrillation ([name])  I48.0 TSH    T3, free    T4, free  6. S/P CABG x 3  Z95.1   7. S/P placement of cardiac pacemaker  Z95.0   8. Unintentional weight loss  R63.4   9. Sarcoid  D86.9   10. Vitamin D deficiency  E55.9 VITAMIN D 25 Hydroxy (Vit-D Deficiency, Fractures)  11. Encounter for long-term (current) use of medications  A25.053 Basic metabolic panel    Hepatic function panel  12. 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 B12 deficiency  E53.8 Vitamin B12   Total encounter time: 60 minutes. On the day of the patient encounter, this can include review of prior records, labs,  and imaging.  Additional time can include counselling, consultation with peer MD in person or by telephone.  This also includes independent review of Radiology.  Exceptionally complicated medical history.  Extensive chart review, review of all notes, radiology, lab tests of recent times, cardiology reports.  Unable to assess all things, as 100s of things could potentially be causing his weight loss and fatigue.  Begin with a basic work-up as below.  His EKG is unchanged compared to prior EKGs.  This was reviewed in the office today as well.  He does have a pacemaker Notable large Q waves.  Known prior history of prior MI. T wave changes are not different compared to prior EKG done in March. Electronically Signed  By: Owens Loffler, MD On: 06/18/2020 11:56 AM   Began with work-up as above.  He will need some additional work-up depending on his lab results and additional based on his fatigue as well as some weight loss.  I am going to defer this management to my partner for additional care, who is his primary care doctor.  Follow-up: Return in about 10 days (around 06/28/2020) for Dr. [name] follow-up complex medical issues.  No orders of the defined types were placed in this encounter.  There are no discontinued medications. Orders Placed This Encounter  Procedures  . CBC with Differential/Platelet  . IBC + Ferritin  . Vitamin B12  .  Basic metabolic panel  . Hepatic function panel  . TSH  . T3, free  . T4, free  . VITAMIN D 25 Hydroxy (Vit-D Deficiency, Fractures)  . EKG 12-Lead    Signed,  Jaianna Nicoll T. Tiphani Mells, MD   Outpatient Encounter Medications as of 06/18/2020  Medication Sig  . albuterol (VENTOLIN HFA) 108 (90 Base) MCG/ACT inhaler Inhale 2 puffs into the lungs every 6 (six) hours as needed for wheezing or shortness of breath.  [name] amLODipine (NORVASC) 5 MG tablet Take 1 tablet (5 mg total) by mouth daily.  [name] aspirin EC 81 MG tablet Take 81 mg by mouth daily. Swallow whole.   . bimatoprost (LUMIGAN) 0.01 % SOLN Place 1 drop into both eyes at bedtime.  . Cholecalciferol 25 MCG (1000 UT) tablet Take 1,000 Units by mouth daily.   . dorzolamide-timolol (COSOPT) 22.3-6.8 MG/ML ophthalmic solution Place 1 drop into both eyes 2 (two) times daily.   [name] ELIQUIS 5 MG TABS tablet Take 1 tablet by mouth twice daily  . finasteride (PROSCAR) 5 MG tablet Take 5 mg by mouth daily after breakfast.   . fluticasone (FLONASE) 50 MCG/ACT nasal spray Place 2 sprays into both nostrils 2 (two) times daily.  . Fluticasone Propionate, Inhal, (FLOVENT DISKUS) 100 MCG/BLIST AEPB Inhale 1 Inhaler into the lungs 2 (two) times daily. (Patient taking differently: Inhale 1 Inhaler into the lungs 2 (two) times daily as needed (shortness of breath/wheezing). )  . MULTAQ 400 MG tablet TAKE 1 TABLET BY MOUTH TWICE DAILY WITH MEALS  . Multiple Vitamin (MULTIVITAMIN WITH MINERALS) TABS tablet Take 1 tablet by mouth daily.  . pantoprazole (PROTONIX) 40 MG tablet Take 1 tablet by mouth once daily  . Polyethyl Glycol-Propyl Glycol (SYSTANE OP) Place 1 drop into both eyes daily as needed (dry eyes).  . rosuvastatin (CRESTOR) 20 MG tablet Take 1 tablet (20 mg total) by mouth daily.  . tadalafil (CIALIS) 5 MG tablet Take 5 mg by mouth at bedtime.   . triamterene-hydrochlorothiazide (MAXZIDE-25) 37.5-25 MG tablet Take 1 tablet by mouth daily.  [name] docusate sodium (COLACE) 100 MG capsule Take 1 capsule (100 mg total) by mouth 2 (two) times daily.   No facility-administered encounter medications on file as of 06/18/2020.

## 2020-06-23 ENCOUNTER — Ambulatory Visit (INDEPENDENT_AMBULATORY_CARE_PROVIDER_SITE_OTHER): Payer: Medicare Other | Admitting: *Deleted

## 2020-06-23 DIAGNOSIS — I442 Atrioventricular block, complete: Secondary | ICD-10-CM

## 2020-06-24 ENCOUNTER — Ambulatory Visit: Payer: Medicare Other | Admitting: Family Medicine

## 2020-06-24 LAB — CUP PACEART REMOTE DEVICE CHECK
Battery Remaining Longevity: 56 mo
Battery Voltage: 2.99 V
Brady Statistic AP VP Percent: 83.51 %
Brady Statistic AP VS Percent: 0 %
Brady Statistic AS VP Percent: 16.48 %
Brady Statistic AS VS Percent: 0 %
Brady Statistic RA Percent Paced: 74.86 %
Brady Statistic RV Percent Paced: 99.88 %
Date Time Interrogation Session: 20210628091625
Implantable Lead Implant Date: 20170517
Implantable Lead Implant Date: 20170517
Implantable Lead Location: 753859
Implantable Lead Location: 753860
Implantable Lead Model: 5076
Implantable Lead Model: 5076
Implantable Pulse Generator Implant Date: 20170517
Lead Channel Impedance Value: 361 Ohm
Lead Channel Impedance Value: 418 Ohm
Lead Channel Impedance Value: 475 Ohm
Lead Channel Impedance Value: 589 Ohm
Lead Channel Pacing Threshold Amplitude: 0.75 V
Lead Channel Pacing Threshold Amplitude: 0.875 V
Lead Channel Pacing Threshold Pulse Width: 0.4 ms
Lead Channel Pacing Threshold Pulse Width: 0.4 ms
Lead Channel Sensing Intrinsic Amplitude: 1.375 mV
Lead Channel Sensing Intrinsic Amplitude: 1.375 mV
Lead Channel Sensing Intrinsic Amplitude: 12.75 mV
Lead Channel Sensing Intrinsic Amplitude: 12.75 mV
Lead Channel Setting Pacing Amplitude: 2 V
Lead Channel Setting Pacing Amplitude: 2.5 V
Lead Channel Setting Pacing Pulse Width: 0.4 ms
Lead Channel Setting Sensing Sensitivity: 2.8 mV

## 2020-06-25 ENCOUNTER — Other Ambulatory Visit: Payer: Self-pay | Admitting: Cardiology

## 2020-06-25 DIAGNOSIS — I4891 Unspecified atrial fibrillation: Secondary | ICD-10-CM

## 2020-06-25 NOTE — Telephone Encounter (Signed)
Pt last saw Dr Curt Bears 03/25/20, last labs 06/18/20 Creat 0.89, age 79, weight 77.8kg, based on specified criteria pt is on appropriate dosage of Eliquis 5mg  BID.  Will refill rx.

## 2020-06-25 NOTE — Progress Notes (Signed)
Remote pacemaker transmission.   

## 2020-06-27 ENCOUNTER — Ambulatory Visit (INDEPENDENT_AMBULATORY_CARE_PROVIDER_SITE_OTHER): Payer: Medicare Other | Admitting: Family Medicine

## 2020-06-27 ENCOUNTER — Other Ambulatory Visit: Payer: Self-pay

## 2020-06-27 ENCOUNTER — Encounter: Payer: Self-pay | Admitting: Family Medicine

## 2020-06-27 VITALS — BP 116/70 | HR 77 | Ht 69.0 in | Wt 177.0 lb

## 2020-06-27 DIAGNOSIS — I25810 Atherosclerosis of coronary artery bypass graft(s) without angina pectoris: Secondary | ICD-10-CM | POA: Diagnosis not present

## 2020-06-27 DIAGNOSIS — R5382 Chronic fatigue, unspecified: Secondary | ICD-10-CM

## 2020-06-27 DIAGNOSIS — R5383 Other fatigue: Secondary | ICD-10-CM | POA: Diagnosis not present

## 2020-06-27 DIAGNOSIS — G5600 Carpal tunnel syndrome, unspecified upper limb: Secondary | ICD-10-CM | POA: Insufficient documentation

## 2020-06-27 NOTE — Patient Instructions (Signed)
Hold Crestor for 1-2 weeks.. restart if not feeling better or restart at lower dose.  Send My Chart note with update in few weeks.  Increase protein some in diet.. like protein shakes.  Follow up with Dr. Brett Fairy and oncologist as planned.

## 2020-06-27 NOTE — Progress Notes (Signed)
Chief Complaint  Patient presents with  . Fatigue    follow up    History of Present Illness: HPI    79 year old male pt with history of Afib, CAD, anemia, CIDP, DISH, history of colon cancer presents with  extreme fatigue x months.  Wakes up tired each morning. Napping in the day does not help  3 surgeries in last 6 months.. TKR, bypass, hernia repair. He tries to walk a mile each day.. . This really wears him out. He has noted brain fog.. cannot think as clearly as before... several month.  Also less appetite in last  3-4 few weeks decreased appetite.   He has lost 10 pounds since March 30.  He has lost 19 pounds since 18 months ago.  He has seen Heme/Onc, cardiology and EP MDs recently. Recent ECHO stable. ( reviewed note in detail)  05/2019 triple bypass  At [facility] with Dr. Lorelei Pont.. EKG and  lab eval was performed on 06/18/2020 EKG is unchanged compared to prior EKGs.  This was reviewed in the office today as well.  He does have a pacemaker Notable large Q waves.  Known prior history of prior MI. T wave changes are not different compared to prior EKG done in March.   No med changed except increase in Crestor 10 to 20 mg Lab Results  Component Value Date   CHOL 147 05/21/2020   HDL 66 05/21/2020   LDLCALC 71 05/21/2020   TRIG 43 05/21/2020   CHOLHDL 2.2 05/21/2020   Has CPAP.. followed by Dr. Brett Fairy.. well adjust. He wears it well.   He denies depression or anxiety.   Office Visit from 06/27/2020 in [place] at [facility] Total Score 0     No abd pain, reflux controlled on pantoprazole, no blood in stool. Regular colonoscopy planned in 08/2020. No dark stool. No urinary change.. has BPH. Followed by Dr. Alinda Money. He denies memory issues.  This visit occurred during the SARS-CoV-2 public health emergency.  Safety protocols were in place, including screening questions prior to the visit, additional usage of staff PPE, and extensive cleaning of exam  room while observing appropriate contact time as indicated for disinfecting solutions.   COVID 19 screen:  No recent travel or known exposure to COVID19 The patient denies respiratory symptoms of COVID 19 at this time. The importance of social distancing was discussed today.     Review of Systems  Constitutional: Positive for malaise/fatigue. Negative for chills and fever.  HENT: Negative for congestion and ear pain.   Eyes: Negative for pain and redness.  Respiratory: Negative for cough and shortness of breath.   Cardiovascular: Negative for chest pain, palpitations and leg swelling.  Gastrointestinal: Negative for abdominal pain, blood in stool, constipation, diarrhea, nausea and vomiting.  Genitourinary: Negative for dysuria.  Musculoskeletal: Negative for falls and myalgias.  Skin: Negative for rash.  Neurological: Negative for dizziness.  Psychiatric/Behavioral: Negative for depression. The patient is not nervous/anxious.       Past Medical History:  Diagnosis Date  . Anemia    iron deficiency anemia- infusion on 02/15/2020 and 02/22/2020  . BENIGN PROSTATIC HYPERTROPHY, WITH OBSTRUCTION 05/28/2010  . CAD (coronary artery disease)    a. s/p NSTEMI 06/01/11: DES to RCA;  b. cath 06/25/11:   dLM 50-60% (FFR 0.87), prox to mid LAD 40-50%, D1 50%, pCFX 50%, RCA stent ok, dPDA 80%, EF 55-60%.  His FFR was felt to be negative and therefore medical therapy was  recommended ;  echo 6/12: EF 55-60%, mild AS   . Chronic back pain   . 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 Colon cancer ([place]) dx'd 2000   "left"  . COLONIC POLYPS, ADENOMATOUS, HX OF 05/28/2010  . Complication of anesthesia    "stopped breathing; related to my sleep apnea" & urinary retention   . COPD (chronic obstructive pulmonary disease) ([place])    "associated w/lung sarcoidosis"  . 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 DISH (diffuse idiopathic skeletal hyperostosis) 12/02/2015  .  Diverticulitis   . GENERALIZED OSTEOARTHROSIS UNSPECIFIED SITE 05/28/2010  . GERD 05/28/2010  . Heart murmur   . History of gout   . History of kidney stones   . HYPERLIPIDEMIA 05/28/2010  . Hypertension   . IgM lambda paraproteinemia   . 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 Malignant neoplasm of descending colon ([place]) 05/28/2010  . MITRAL VALVE PROLAPSE 05/28/2010  . 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 NSTEMI (non-ST elevated myocardial infarction) ([place]) 06/03/11  . 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  . OSA on CPAP 05/28/2010  . PALPITATIONS, CHRONIC 05/28/2010  . Parotid gland pain 2010   infection  . Peripheral neuropathy    "tx'd w/targeted chemo" (05/12/2016)  . Pneumonia 2-3 times  . Presence of permanent cardiac pacemaker    [employer]  . 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 Sarcoidosis    pulmonalis  . UNSPECIFIED INFLAMMATORY AND TOXIC NEUROPATHY 05/28/2010  . Vision loss   . VITAMIN D DEFICIENCY 05/28/2010    reports that he has never smoked. He has never used smokeless tobacco. He reports previous alcohol use. He reports that he does not use drugs.   Current Outpatient Medications:  .  albuterol (VENTOLIN HFA) 108 (90 Base) MCG/ACT inhaler, Inhale 2 puffs into the lungs every 6 (six) hours as needed for wheezing or shortness of breath., Disp: 8 g, Rfl: 2 .  amLODipine (NORVASC) 5 MG tablet, Take 1 tablet (5 mg total) by mouth daily., Disp: 90 tablet, Rfl: 3 .  aspirin EC 81 MG tablet, Take 81 mg by mouth daily. Swallow whole., Disp: , Rfl:  .  bimatoprost (LUMIGAN) 0.01 % SOLN, Place 1 drop into both eyes at bedtime., Disp: , Rfl:  .  Cholecalciferol 25 MCG (1000 UT) tablet, Take 1,000 Units by mouth daily. , Disp: , Rfl:  .  dorzolamide-timolol (COSOPT) 22.3-6.8 MG/ML ophthalmic solution, Place 1 drop into both eyes 2 (two) times daily. , Disp: , Rfl:  .  ELIQUIS 5 MG TABS tablet, Take 1 tablet by mouth twice daily,  Disp: 180 tablet, Rfl: 1 .  finasteride (PROSCAR) 5 MG tablet, Take 5 mg by mouth daily after breakfast. , Disp: , Rfl:  .  fluticasone (FLONASE) 50 MCG/ACT nasal spray, Place 2 sprays into both nostrils 2 (two) times daily., Disp: , Rfl:  .  Fluticasone Propionate, Inhal, (FLOVENT DISKUS) 100 MCG/BLIST AEPB, Inhale 1 Inhaler into the lungs 2 (two) times daily. (Patient taking differently: Inhale 1 Inhaler into the lungs 2 (two) times daily as needed (shortness of breath/wheezing). ), Disp: 60 each, Rfl: 2 .  MULTAQ 400 MG tablet, TAKE 1 TABLET BY MOUTH TWICE DAILY WITH MEALS, Disp: 180 tablet, Rfl: 3 .  Multiple Vitamin (MULTIVITAMIN WITH MINERALS) TABS tablet, Take 1 tablet by mouth daily., Disp: , Rfl:  .  pantoprazole (PROTONIX) 40 MG tablet, Take 1 tablet by mouth once daily, Disp: 90 tablet, Rfl: 2 .  Polyethyl Glycol-Propyl Glycol (SYSTANE OP), Place 1 drop into both eyes daily as needed (dry eyes)., Disp: , Rfl:  .  rosuvastatin (CRESTOR) 20 MG tablet, Take 1 tablet (20 mg total) by mouth daily., Disp: 90 tablet, Rfl: 3 .  tadalafil (CIALIS) 5 MG tablet, Take 5 mg by mouth at bedtime. , Disp: , Rfl:  .  triamterene-hydrochlorothiazide (MAXZIDE-25) 37.5-25 MG tablet, Take 1 tablet by mouth daily., Disp: 90 tablet, Rfl: 3   Observations/Objective: Blood pressure 116/70, pulse 77, height 5\' 9"  (1.753 m), weight 177 lb (80.3 kg), SpO2 98 %.  Physical Exam Constitutional:      Appearance: He is well-developed.  HENT:     Head: Normocephalic.     Right Ear: Hearing normal.     Left Ear: Hearing normal.     Nose: Nose normal.  Neck:     Thyroid: No thyroid mass or thyromegaly.     Vascular: No carotid bruit.     Trachea: Trachea normal.  Cardiovascular:     Rate and Rhythm: Normal rate and regular rhythm.     Pulses: Normal pulses.     Heart sounds: Heart sounds not distant. No murmur heard.  No friction rub. No gallop.      Comments: No peripheral edema Pulmonary:     Effort:  Pulmonary effort is normal. No respiratory distress.     Breath sounds: Normal breath sounds.  Skin:    General: Skin is warm and dry.     Findings: No rash.  Psychiatric:        Speech: Speech normal.        Behavior: Behavior normal.        Thought Content: Thought content normal.      Assessment and Plan   Chronic fatigue Extensive lab eval negative.  Likely multifactorial cause incudling recent surgeries, CAD, etc.  GIven possible SE: Hold Crestor for 1-2 weeks.. restart if not feeling better or restart at lower dose.  Some element of malnutrition increase protein some in diet.. like protein shakes.  Follow up with Dr. Brett Fairy and oncologist as planned.        Derrick Lofts, MD

## 2020-08-22 NOTE — Assessment & Plan Note (Signed)
Extensive lab eval negative.  Likely multifactorial cause incudling recent surgeries, CAD, etc.  GIven possible SE: Hold Crestor for 1-2 weeks.. restart if not feeling better or restart at lower dose.  Some element of malnutrition increase protein some in diet.. like protein shakes.  Follow up with Dr. Brett Fairy and oncologist as planned.

## 2020-08-29 DIAGNOSIS — Z23 Encounter for immunization: Secondary | ICD-10-CM | POA: Diagnosis not present

## 2020-09-02 ENCOUNTER — Other Ambulatory Visit: Payer: Medicare Other

## 2020-09-02 DIAGNOSIS — H401112 Primary open-angle glaucoma, right eye, moderate stage: Secondary | ICD-10-CM | POA: Diagnosis not present

## 2020-09-02 DIAGNOSIS — H35371 Puckering of macula, right eye: Secondary | ICD-10-CM | POA: Diagnosis not present

## 2020-09-02 DIAGNOSIS — H401122 Primary open-angle glaucoma, left eye, moderate stage: Secondary | ICD-10-CM | POA: Diagnosis not present

## 2020-09-02 DIAGNOSIS — H524 Presbyopia: Secondary | ICD-10-CM | POA: Diagnosis not present

## 2020-09-03 ENCOUNTER — Encounter: Payer: Self-pay | Admitting: Cardiovascular Disease

## 2020-09-03 ENCOUNTER — Other Ambulatory Visit: Payer: Self-pay

## 2020-09-03 ENCOUNTER — Ambulatory Visit (INDEPENDENT_AMBULATORY_CARE_PROVIDER_SITE_OTHER): Payer: Medicare Other | Admitting: Cardiovascular Disease

## 2020-09-03 VITALS — BP 118/60 | HR 62 | Ht 69.0 in | Wt 183.0 lb

## 2020-09-03 DIAGNOSIS — I2510 Atherosclerotic heart disease of native coronary artery without angina pectoris: Secondary | ICD-10-CM | POA: Diagnosis not present

## 2020-09-03 DIAGNOSIS — E782 Mixed hyperlipidemia: Secondary | ICD-10-CM | POA: Diagnosis not present

## 2020-09-03 DIAGNOSIS — I350 Nonrheumatic aortic (valve) stenosis: Secondary | ICD-10-CM | POA: Diagnosis not present

## 2020-09-03 DIAGNOSIS — I25810 Atherosclerosis of coronary artery bypass graft(s) without angina pectoris: Secondary | ICD-10-CM

## 2020-09-03 DIAGNOSIS — I480 Paroxysmal atrial fibrillation: Secondary | ICD-10-CM | POA: Diagnosis not present

## 2020-09-03 NOTE — Progress Notes (Signed)
Cardiology Office Note:    Date:  09/03/2020   ID:  Derrick [name], Derrick [name] [birth year], MRN [id number]  PCP:  [name], MD  [facility] Cardiologist:  [name], MD  [facility] Electrophysiologist:  [name], MD   Referring MD: [name], MD   Chief Complaint  Patient presents with  . Coronary Artery Disease   History of Present Illness:    Derrick [name] is a 79 y.o. male with a hx of:  Coronary artery disease ? Status post inferior MI in 2012 treated with DES to the RCA ? LM Dz >>S/p CABG 05/2019: L-LAD, S-OM1, S-PDA  Paroxysmal atrial fibrillation ? CHADS2-VASc=4 (agex2, HTN, CAD) >> Apixaban  Sick sinus syndrome status post pacemaker  Hypertension  Hyperlipidemia  NSVT  Pulmonary sarcoid  MGUS  Chronic inflammatory demyelinating polyneuropathy  Colon cancer  The patient is here alone today.  He is doing pretty well overall.  He does complain of generalized fatigue and is undergone an extensive work-up without any clear cause.  He naps most afternoons.  He is still walking without exertional symptoms.  He denies pain or pressure in the chest or upper back which was his prior angina.  No shortness of breath, edema, or heart palpitations.  Past Medical History:  Diagnosis Date  . Anemia    iron deficiency anemia- infusion on 02/15/2020 and 02/22/2020  . BENIGN PROSTATIC HYPERTROPHY, WITH OBSTRUCTION 05/28/2010  . CAD (coronary artery disease)    a. s/p NSTEMI 06/01/11: DES to RCA;  b. cath 06/25/11:   dLM 50-60% (FFR 0.87), prox to mid LAD 40-50%, D1 50%, pCFX 50%, RCA stent ok, dPDA 80%, EF 55-60%.  His FFR was felt to be negative and therefore medical therapy was recommended ;  echo 6/12: EF 55-60%, mild AS   . Chronic back pain   . CIDP (chronic inflammatory demyelinating polyneuropathy) ([place]) 04/11/2012  . Colon cancer ([place]) dx'd 2000   "left"  . COLONIC POLYPS, ADENOMATOUS, HX OF 05/28/2010  . Complication of anesthesia     "stopped breathing; related to my sleep apnea" & urinary retention   . COPD (chronic obstructive pulmonary disease) ([place])    "associated w/lung sarcoidosis"  . 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 DISH (diffuse idiopathic skeletal hyperostosis) 12/02/2015  . Diverticulitis   . GENERALIZED OSTEOARTHROSIS UNSPECIFIED SITE 05/28/2010  . GERD 05/28/2010  . Heart murmur   . History of gout   . History of kidney stones   . HYPERLIPIDEMIA 05/28/2010  . Hypertension   . IgM lambda paraproteinemia   . 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 Malignant neoplasm of descending colon ([place]) 05/28/2010  . MITRAL VALVE PROLAPSE 05/28/2010  . 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 NSTEMI (non-ST elevated myocardial infarction) ([place]) 06/03/11  . 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  . OSA on CPAP 05/28/2010  . PALPITATIONS, CHRONIC 05/28/2010  . Parotid gland pain 2010   infection  . Peripheral neuropathy    "tx'd w/targeted chemo" (05/12/2016)  . Pneumonia 2-3 times  . Presence of permanent cardiac pacemaker    Medtronic  . 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 Sarcoidosis    pulmonalis  . UNSPECIFIED INFLAMMATORY AND TOXIC NEUROPATHY 05/28/2010  . Vision loss   . VITAMIN D DEFICIENCY 05/28/2010    Past Surgical History:  Procedure Laterality Date  . APPENDECTOMY  1954   age 46  . CATARACT EXTRACTION W/ INTRAOCULAR LENS IMPLANT Left   .  COLON SURGERY  2000   decending colon   . CORONARY ANGIOPLASTY WITH STENT PLACEMENT  06/03/11  . CORONARY ARTERY BYPASS GRAFT N/A 06/05/2019   Procedure: CORONARY ARTERY BYPASS GRAFTING (CABG) TIMES THREE USING LEFT INTERNAL MAMMARY ARTERY AND RIGHT GREATER SEPHANOUS VEIN;  Surgeon: [name], MD;  Location: [place];  Service: Open Heart Surgery;  Laterality: N/A;  . ELECTROPHYSIOLOGIC STUDY N/A 05/12/2016   Procedure: Cardioversion;  Surgeon: [name], MD;  Location: [place] [facility];  Service:  Cardiovascular;  Laterality: N/A;  . EP IMPLANTABLE DEVICE N/A 05/12/2016   Procedure: Pacemaker Implant;  Surgeon: [name], MD;  Location: [place] [facility];  Service: Cardiovascular;  Laterality: N/A;  . EYE SURGERY     bilateral cataract with lens implant  . HERNIA REPAIR  01/2019   left inguinal hernia repair  . INGUINAL HERNIA REPAIR Left 02/21/2019   Procedure: LEFT INGUINAL HERNIA REPAIR WITH MESH;  Surgeon: [name], MD;  Location: [place];  Service: General;  Laterality: Left;  . INSERT / REPLACE / REMOVE PACEMAKER  05/12/2016  . INTRAVASCULAR PRESSURE WIRE/FFR STUDY N/A 05/15/2019   Procedure: INTRAVASCULAR PRESSURE WIRE/FFR STUDY;  Surgeon: [name], MD;  Location: [place] [facility];  Service: Cardiovascular;  Laterality: N/A;  . KNEE ARTHROSCOPY Right 2003  . LEFT HEART CATH AND CORONARY ANGIOGRAPHY N/A 05/15/2019   Procedure: LEFT HEART CATH AND CORONARY ANGIOGRAPHY;  Surgeon: [name], MD;  Location: [place] [facility];  Service: Cardiovascular;  Laterality: N/A;  . LEFT HEART CATHETERIZATION WITH CORONARY ANGIOGRAM N/A 03/31/2015   Procedure: LEFT HEART CATHETERIZATION WITH CORONARY ANGIOGRAM;  Surgeon: [name], MD;  Location: [facility] C[facility];  Service: Cardiovascular;  Laterality: N/A;  . LUNG SURGERY  2001   "open lung dissection"  . MOHS SURGERY Left ~ 2008   ear  . PROSTATE BIOPSY  2010  . TEE WITHOUT CARDIOVERSION N/A 06/05/2019   Procedure: TRANSESOPHAGEAL ECHOCARDIOGRAM (TEE);  Surgeon: [name], [name], MD;  Location: [place];  Service: Open Heart Surgery;  Laterality: N/A;  . TONSILLECTOMY  1950  . TOTAL KNEE ARTHROPLASTY Right 03/11/2020   Procedure: TOTAL KNEE ARTHROPLASTY;  Surgeon: [name], MD;  Location: [facility];  Service: Orthopedics;  Laterality: Right;  70 mins    Current Medications: Current Meds  Medication Sig  . albuterol (VENTOLIN HFA) 108 (90 Base) MCG/ACT inhaler Inhale 2 puffs into the lungs every 6 (six) hours as  needed for wheezing or shortness of breath.  [name] amLODipine (NORVASC) 5 MG tablet Take 1 tablet (5 mg total) by mouth daily.  . bimatoprost (LUMIGAN) 0.01 % SOLN Place 1 drop into both eyes at bedtime.  . Cholecalciferol 25 MCG (1000 UT) tablet Take 1,000 Units by mouth daily.   . dorzolamide-timolol (COSOPT) 22.3-6.8 MG/ML ophthalmic solution Place 1 drop into both eyes 2 (two) times daily.   [name] ELIQUIS 5 MG TABS tablet Take 1 tablet by mouth twice daily  . finasteride (PROSCAR) 5 MG tablet Take 5 mg by mouth daily after breakfast.   . fluticasone (FLONASE) 50 MCG/ACT nasal spray Place 2 sprays into both nostrils 2 (two) times daily.  . Fluticasone Propionate, Inhal, (FLOVENT DISKUS) 100 MCG/BLIST AEPB Inhale 1 Inhaler into the lungs 2 (two) times daily.  . MULTAQ 400 MG tablet TAKE 1 TABLET BY MOUTH TWICE DAILY WITH MEALS  . Multiple Vitamin (MULTIVITAMIN WITH MINERALS) TABS tablet Take 1 tablet by mouth daily.  . pantoprazole (PROTONIX) 40 MG  tablet Take 1 tablet by mouth once daily  . Polyethyl Glycol-Propyl Glycol (SYSTANE OP) Place 1 drop into both eyes daily as needed (dry eyes).  . rosuvastatin (CRESTOR) 20 MG tablet Take 1 tablet (20 mg total) by mouth daily.  . tadalafil (CIALIS) 5 MG tablet Take 5 mg by mouth at bedtime.   . triamterene-hydrochlorothiazide (MAXZIDE-25) 37.5-25 MG tablet Take 1 tablet by mouth daily.  . [DISCONTINUED] aspirin EC 81 MG tablet Take 81 mg by mouth daily. Swallow whole.     Allergies:   Miralax [polyethylene glycol] and Nsaids   Social History   Socioeconomic History  . Marital status: Married    Spouse name: Not on file  . Number of children: 2  . Years of education: MS-Coll.  . Highest education level: Not on file  Occupational History  . Occupation: Optometrist    Comment: Building services engineer  Tobacco Use  . Smoking status: Never Smoker  . Smokeless tobacco: Never Used  Vaping Use  . Vaping Use: Never used  Substance and Sexual Activity   . Alcohol use: Not Currently    Comment: occasional  . Drug use: No  . Sexual activity: Yes    Birth control/protection: None  Other Topics Concern  . Not on file  Social History Narrative   College- [employer]; [employer]-environment science and mgt. Married [name])  "13" 1 son- 19; 1 dtr "47" 2 grandchildren. 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 End of life-provided discussive context and provided packet.   Social Determinants of Health   Financial Resource Strain:   . Difficulty of Paying Living Expenses: Not on file  Food Insecurity:   . Worried About Charity fundraiser in the Last Year: Not on file  . Ran Out of Food in the Last Year: Not on file  Transportation Needs:   . Lack of Transportation (Medical): Not on file  . Lack of Transportation (Non-Medical): Not on file  Physical Activity:   . Days of Exercise per Week: Not on file  . Minutes of Exercise per Session: Not on file  Stress:   . Feeling of Stress : Not on file  Social Connections:   . Frequency of Communication with Friends and Family: Not on file  . Frequency of Social Gatherings with Friends and Family: Not on file  . Attends Religious Services: Not on file  . Active Member of Clubs or Organizations: Not on file  . Attends Archivist Meetings: Not on file  . Marital Status: Not on file     Family History: The patient's family history includes Arrhythmia (age of onset: 26) in his brother; Arthritis in his mother; Cancer in his brother; Colon polyps in his mother; Congestive Heart Failure in his brother; Coronary artery disease in his brother and mother; Heart attack in his brother; Heart disease in his mother.  ROS:   Please see the history of present illness.    All other systems reviewed and are negative.  EKGs/Labs/Other Studies Reviewed:    The following studies were reviewed today: Echo 05-21-2020: IMPRESSIONS    1. Left ventricular ejection fraction, by estimation, is 60  to 65%. Left  ventricular ejection fraction by 3D volume is 62 %. The left ventricle has  normal function. Abnormal (paradoxical) septal motion, consistent with RV  pacemaker. There is mild left  ventricular hypertrophy. Left ventricular diastolic parameters are  consistent with Grade II diastolic dysfunction (pseudonormalization).  Elevated left atrial pressure.  2. Right ventricular systolic function  is mildly reduced. The right  ventricular size is mildly enlarged. There is normal pulmonary artery  systolic pressure. The estimated right ventricular systolic pressure is  83.3 mmHg.  3. Left atrial size was moderately dilated.  4. Right atrial size was mildly dilated.  5. The mitral valve is normal in structure. Mild mitral annular  calcifications. Trivial mitral valve regurgitation.  6. The aortic valve is abnormal. Moderate calcifications. Aortic valve  regurgitation is not visualized. Mild to moderate aortic valve stenosis.  Vmax 2.8 m/s, MG 17 mmHg, AVA 1.8 cm^2, DI 0.46  7. Aortic dilatation noted. There is dilatation of the ascending aorta  measuring 42 mm.  8. The inferior vena cava is dilated in size with >50% respiratory  variability, suggesting right atrial pressure of 8 mmHg.   EKG:  EKG is not ordered today.   Recent Labs: 06/18/2020: ALT 12; BUN 15; Creatinine, Ser 0.89; Hemoglobin 12.4; Platelets 173.0; Potassium 4.1; Sodium 135; TSH 2.06  Recent Lipid Panel    Component Value Date/Time   CHOL 147 05/21/2020 0721   TRIG 43 05/21/2020 0721   HDL 66 05/21/2020 0721   CHOLHDL 2.2 05/21/2020 0721   CHOLHDL 2.7 03/19/2016 0922   VLDL 14 03/19/2016 0922   LDLCALC 71 05/21/2020 0721    Physical Exam:    VS:  BP 118/60   Pulse 62   Ht 5\' 9"  (1.753 m)   Wt 183 lb (83 kg)   SpO2 98%   BMI 27.02 kg/m     Wt Readings from Last 3 Encounters:  09/03/20 183 lb (83 kg)  06/27/20 177 lb (80.3 kg)  06/18/20 171 lb 8 oz (77.8 kg)     GEN:  Well nourished, well  developed in no acute distress HEENT: Normal NECK: No JVD; No carotid bruits LYMPHATICS: No lymphadenopathy CARDIAC: RRR, 3/6 harsh mid to late peaking systolic murmur at the right upper sternal border RESPIRATORY:  Clear to auscultation without rales, wheezing or rhonchi  ABDOMEN: Soft, non-tender, non-distended MUSCULOSKELETAL:  No edema; No deformity  SKIN: Warm and dry NEUROLOGIC:  Alert and oriented x 3 PSYCHIATRIC:  Normal affect   ASSESSMENT:    1. Atherosclerosis of native coronary artery of native heart without angina pectoris   2. Nonrheumatic aortic valve stenosis   3. Mixed hyperlipidemia   4. Paroxysmal atrial fibrillation ([name])    PLAN:    In order of problems listed above:  1. The patient appears stable.  He is greater than 1 year out from CABG with no anginal symptoms now.  I recommended that he stop aspirin in the setting of easy bruising and bleeding.  He will continue on apixaban in light of paroxysmal atrial fibrillation. 2. Recent echo reviewed with mild to moderate aortic stenosis.  His exam suggests at least moderate aortic stenosis, but echo findings are reassuring.  He will have a 1 year echo follow-up.  Cardinal symptoms of aortic stenosis are discussed with the patient. 3. Treated with a high intensity statin drug (rosuvastatin 20 mg).  Lipids reviewed with an LDL cholesterol of 71, HDL 66. 4. Appears to be maintaining sinus rhythm.  Tolerating oral anticoagulation with apixaban.  No changes are made today.  Follows with Dr. Curt Bears after pacemaker implantation.  Medication Adjustments/Labs and Tests Ordered: Current medicines are reviewed at length with the patient today.  Concerns regarding medicines are outlined above.  No orders of the defined types were placed in this encounter.  No orders of the defined types were  placed in this encounter.   Patient Instructions  Medication Instructions:  1) STOP ASPIRIN *If you need a refill on your cardiac  medications before your next appointment, please call your pharmacy*  Follow-Up: You will be called to arrange your 1 year echo and office visit with Dr. Burt Knack.    Signed, [name], MD  09/03/2020 1:52 PM    [facility]

## 2020-09-03 NOTE — Patient Instructions (Signed)
Medication Instructions:  1) STOP ASPIRIN *If you need a refill on your cardiac medications before your next appointment, please call your pharmacy*  Follow-Up: You will be called to arrange your 1 year echo and office visit with Dr. Burt Knack.

## 2020-09-05 DIAGNOSIS — Z471 Aftercare following joint replacement surgery: Secondary | ICD-10-CM | POA: Diagnosis not present

## 2020-09-05 DIAGNOSIS — Z96651 Presence of right artificial knee joint: Secondary | ICD-10-CM | POA: Diagnosis not present

## 2020-09-09 ENCOUNTER — Ambulatory Visit: Payer: Medicare Other | Admitting: Hematology and Oncology

## 2020-09-19 ENCOUNTER — Other Ambulatory Visit: Payer: Self-pay | Admitting: Cardiovascular Disease

## 2020-09-19 DIAGNOSIS — I4891 Unspecified atrial fibrillation: Secondary | ICD-10-CM

## 2020-09-22 ENCOUNTER — Ambulatory Visit (INDEPENDENT_AMBULATORY_CARE_PROVIDER_SITE_OTHER): Payer: Medicare Other

## 2020-09-22 DIAGNOSIS — I442 Atrioventricular block, complete: Secondary | ICD-10-CM | POA: Diagnosis not present

## 2020-09-22 LAB — CUP PACEART REMOTE DEVICE CHECK
Battery Remaining Longevity: 57 mo
Battery Remaining Longevity: 57 mo
Battery Voltage: 2.99 V
Battery Voltage: 2.99 V
Brady Statistic AP VP Percent: 95.23 %
Brady Statistic AP VP Percent: 95.23 %
Brady Statistic AP VS Percent: 0 %
Brady Statistic AP VS Percent: 0 %
Brady Statistic AS VP Percent: 4.76 %
Brady Statistic AS VP Percent: 4.76 %
Brady Statistic AS VS Percent: 0 %
Brady Statistic AS VS Percent: 0 %
Brady Statistic RA Percent Paced: 93.19 %
Brady Statistic RA Percent Paced: 93.19 %
Brady Statistic RV Percent Paced: 99.86 %
Brady Statistic RV Percent Paced: 99.86 %
Date Time Interrogation Session: 20210927081716
Date Time Interrogation Session: 20210927081716
Implantable Lead Implant Date: 20170517
Implantable Lead Implant Date: 20170517
Implantable Lead Implant Date: 20170517
Implantable Lead Implant Date: 20170517
Implantable Lead Location: 753859
Implantable Lead Location: 753860
Implantable Lead Location: 753859
Implantable Lead Location: 753860
Implantable Lead Model: 5076
Implantable Lead Model: 5076
Implantable Lead Model: 5076
Implantable Lead Model: 5076
Implantable Pulse Generator Implant Date: 20170517
Implantable Pulse Generator Implant Date: 20170517
Lead Channel Impedance Value: 361 Ohm
Lead Channel Impedance Value: 418 Ohm
Lead Channel Impedance Value: 475 Ohm
Lead Channel Impedance Value: 589 Ohm
Lead Channel Impedance Value: 361 Ohm
Lead Channel Impedance Value: 418 Ohm
Lead Channel Impedance Value: 475 Ohm
Lead Channel Impedance Value: 589 Ohm
Lead Channel Pacing Threshold Amplitude: 0.75 V
Lead Channel Pacing Threshold Amplitude: 0.875 V
Lead Channel Pacing Threshold Amplitude: 0.75 V
Lead Channel Pacing Threshold Amplitude: 0.875 V
Lead Channel Pacing Threshold Pulse Width: 0.4 ms
Lead Channel Pacing Threshold Pulse Width: 0.4 ms
Lead Channel Pacing Threshold Pulse Width: 0.4 ms
Lead Channel Pacing Threshold Pulse Width: 0.4 ms
Lead Channel Sensing Intrinsic Amplitude: 1.75 mV
Lead Channel Sensing Intrinsic Amplitude: 1.75 mV
Lead Channel Sensing Intrinsic Amplitude: 6.375 mV
Lead Channel Sensing Intrinsic Amplitude: 6.375 mV
Lead Channel Sensing Intrinsic Amplitude: 1.75 mV
Lead Channel Sensing Intrinsic Amplitude: 1.75 mV
Lead Channel Sensing Intrinsic Amplitude: 6.375 mV
Lead Channel Sensing Intrinsic Amplitude: 6.375 mV
Lead Channel Setting Pacing Amplitude: 2 V
Lead Channel Setting Pacing Amplitude: 2.5 V
Lead Channel Setting Pacing Amplitude: 2 V
Lead Channel Setting Pacing Amplitude: 2.5 V
Lead Channel Setting Pacing Pulse Width: 0.4 ms
Lead Channel Setting Pacing Pulse Width: 0.4 ms
Lead Channel Setting Sensing Sensitivity: 2.8 mV
Lead Channel Setting Sensing Sensitivity: 2.8 mV

## 2020-09-23 ENCOUNTER — Ambulatory Visit (INDEPENDENT_AMBULATORY_CARE_PROVIDER_SITE_OTHER): Payer: Medicare Other | Admitting: Cardiology

## 2020-09-23 ENCOUNTER — Other Ambulatory Visit: Payer: Self-pay | Admitting: Cardiovascular Disease

## 2020-09-23 ENCOUNTER — Encounter: Payer: Self-pay | Admitting: Cardiology

## 2020-09-23 ENCOUNTER — Other Ambulatory Visit: Payer: Self-pay

## 2020-09-23 VITALS — BP 124/66 | HR 65 | Ht 69.0 in | Wt 182.0 lb

## 2020-09-23 DIAGNOSIS — I25810 Atherosclerosis of coronary artery bypass graft(s) without angina pectoris: Secondary | ICD-10-CM | POA: Diagnosis not present

## 2020-09-23 DIAGNOSIS — I480 Paroxysmal atrial fibrillation: Secondary | ICD-10-CM | POA: Diagnosis not present

## 2020-09-23 NOTE — Progress Notes (Signed)
Electrophysiology Office Note   Date:  09/23/2020   ID:  Derrick, [name] 11-27-1941, MRN [id number]  PCP:  [name], MD  Cardiologist:  Burt Knack Primary Electrophysiologist:  [name], MD    No chief complaint on file.    History of Present Illness: Derrick [name] is a 79 y.o. male who presents today for electrophysiology evaluation.   He has a history of coronary artery disease status post MI and drug-eluting stent to the RCA.  He also has moderate left main disease.  He has a history of CDI PE, pulmonary sarcoid, monoclonal gammopathy of uncertain significance.  He has sick sinus syndrome and is now status post Medtronic dual-chamber pacemaker.  He was in atrial fibrillation and is maintained on Multaq.  Due to left main coronary disease, he is now status post three-vessel CABG in June 2020.  Today, denies symptoms of palpitations, chest pain, shortness of breath, orthopnea, PND, lower extremity edema, claudication, dizziness, presyncope, syncope, bleeding, or neurologic sequela. The patient is tolerating medications without difficulties.  Since last being seen he has done well.  He has had multiple surgeries and had a knee replacement 6 months ago.  He is continuing to recover from that.  He has noted an increase in his level of fatigue.  He says that he is having to take naps towards the end of the day.  Aside from that, he has been doing well and is without major complaints.   Past Medical History:  Diagnosis Date  . Anemia    iron deficiency anemia- infusion on 02/15/2020 and 02/22/2020  . BENIGN PROSTATIC HYPERTROPHY, WITH OBSTRUCTION 05/28/2010  . CAD (coronary artery disease)    a. s/p NSTEMI 06/01/11: DES to RCA;  b. cath 06/25/11:   dLM 50-60% (FFR 0.87), prox to mid LAD 40-50%, D1 50%, pCFX 50%, RCA stent ok, dPDA 80%, EF 55-60%.  His FFR was felt to be negative and therefore medical therapy was recommended ;  echo 6/12: EF 55-60%, mild AS   . Chronic back  pain   . 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 Colon cancer ([place]) dx'd 2000   "left"  . COLONIC POLYPS, ADENOMATOUS, HX OF 05/28/2010  . Complication of anesthesia    "stopped breathing; related to my sleep apnea" & urinary retention   . COPD (chronic obstructive pulmonary disease) ([place])    "associated w/lung sarcoidosis"  . 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 DISH (diffuse idiopathic skeletal hyperostosis) 12/02/2015  . Diverticulitis   . GENERALIZED OSTEOARTHROSIS UNSPECIFIED SITE 05/28/2010  . GERD 05/28/2010  . Heart murmur   . History of gout   . History of kidney stones   . HYPERLIPIDEMIA 05/28/2010  . Hypertension   . IgM lambda paraproteinemia   . 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 Malignant neoplasm of descending colon ([place]) 05/28/2010  . MITRAL VALVE PROLAPSE 05/28/2010  . 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 NSTEMI (non-ST elevated myocardial infarction) ([place]) 06/03/11  . 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  . OSA on CPAP 05/28/2010  . PALPITATIONS, CHRONIC 05/28/2010  . Parotid gland pain 2010   infection  . Peripheral neuropathy    "tx'd w/targeted chemo" (05/12/2016)  . Pneumonia 2-3 times  . Presence of permanent cardiac pacemaker    Medtronic  . 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 Sarcoidosis    pulmonalis  . UNSPECIFIED INFLAMMATORY AND TOXIC NEUROPATHY 05/28/2010  . Vision loss   .  VITAMIN D DEFICIENCY 05/28/2010   Past Surgical History:  Procedure Laterality Date  . APPENDECTOMY  1954   age 48  . CATARACT EXTRACTION W/ INTRAOCULAR LENS IMPLANT Left   . COLON SURGERY  2000   decending colon   . CORONARY ANGIOPLASTY WITH STENT PLACEMENT  06/03/11  . CORONARY ARTERY BYPASS GRAFT N/A 06/05/2019   Procedure: CORONARY ARTERY BYPASS GRAFTING (CABG) TIMES THREE USING LEFT INTERNAL MAMMARY ARTERY AND RIGHT GREATER SEPHANOUS VEIN;  Surgeon: [name], MD;  Location:  [facility];  Service: Open Heart Surgery;  Laterality: N/A;  . ELECTROPHYSIOLOGIC STUDY N/A 05/12/2016   Procedure: Cardioversion;  Surgeon: [name], MD;  Location: [facility];  Service: Cardiovascular;  Laterality: N/A;  . EP IMPLANTABLE DEVICE N/A 05/12/2016   Procedure: Pacemaker Implant;  Surgeon: [name], MD;  Location: [facility];  Service: Cardiovascular;  Laterality: N/A;  . EYE SURGERY     bilateral cataract with lens implant  . HERNIA REPAIR  01/2019   left inguinal hernia repair  . INGUINAL HERNIA REPAIR Left 02/21/2019   Procedure: LEFT INGUINAL HERNIA REPAIR WITH MESH;  Surgeon: [name], MD;  Location: [facility];  Service: General;  Laterality: Left;  . INSERT / REPLACE / REMOVE PACEMAKER  05/12/2016  . INTRAVASCULAR PRESSURE WIRE/FFR STUDY N/A 05/15/2019   Procedure: INTRAVASCULAR PRESSURE WIRE/FFR STUDY;  Surgeon: [name], MD;  Location: [facility];  Service: Cardiovascular;  Laterality: N/A;  . KNEE ARTHROSCOPY Right 2003  . LEFT HEART CATH AND CORONARY ANGIOGRAPHY N/A 05/15/2019   Procedure: LEFT HEART CATH AND CORONARY ANGIOGRAPHY;  Surgeon: [name], MD;  Location: [facility];  Service: Cardiovascular;  Laterality: N/A;  . LEFT HEART CATHETERIZATION WITH CORONARY ANGIOGRAM N/A 03/31/2015   Procedure: LEFT HEART CATHETERIZATION WITH CORONARY ANGIOGRAM;  Surgeon: [name], MD;  Location: [facility];  Service: Cardiovascular;  Laterality: N/A;  . LUNG SURGERY  2001   "open lung dissection"  . MOHS SURGERY Left ~ 2008   ear  . PROSTATE BIOPSY  2010  . TEE WITHOUT CARDIOVERSION N/A 06/05/2019   Procedure: TRANSESOPHAGEAL ECHOCARDIOGRAM (TEE);  Surgeon: [name], [name], MD;  Location: [facility];  Service: Open Heart Surgery;  Laterality: N/A;  . TONSILLECTOMY  1950  . TOTAL KNEE ARTHROPLASTY Right 03/11/2020   Procedure: TOTAL KNEE ARTHROPLASTY;  Surgeon: [name], MD;  Location: [facility];  Service:  Orthopedics;  Laterality: Right;  70 mins     Current Outpatient Medications  Medication Sig Dispense Refill  . albuterol (VENTOLIN HFA) 108 (90 Base) MCG/ACT inhaler Inhale 2 puffs into the lungs every 6 (six) hours as needed for wheezing or shortness of breath. 8 g 2  . amLODipine (NORVASC) 5 MG tablet Take 1 tablet (5 mg total) by mouth daily. 90 tablet 3  . bimatoprost (LUMIGAN) 0.01 % SOLN Place 1 drop into both eyes at bedtime.    . Cholecalciferol 25 MCG (1000 UT) tablet Take 1,000 Units by mouth daily.     . dorzolamide-timolol (COSOPT) 22.3-6.8 MG/ML ophthalmic solution Place 1 drop into both eyes 2 (two) times daily.     [name] ELIQUIS 5 MG TABS tablet Take 1 tablet by mouth twice daily 180 tablet 1  . finasteride (PROSCAR) 5 MG tablet Take 5 mg by mouth daily after breakfast.     . fluticasone (FLONASE) 50 MCG/ACT nasal spray Place 2 sprays into both nostrils 2 (two) times daily.    [name]  Fluticasone Propionate, Inhal, (FLOVENT DISKUS) 100 MCG/BLIST AEPB Inhale 1 Inhaler into the lungs 2 (two) times daily. 60 each 2  . MULTAQ 400 MG tablet TAKE 1 TABLET BY MOUTH TWICE DAILY WITH MEALS 180 tablet 3  . Multiple Vitamin (MULTIVITAMIN WITH MINERALS) TABS tablet Take 1 tablet by mouth daily.    . pantoprazole (PROTONIX) 40 MG tablet Take 1 tablet by mouth once daily 90 tablet 0  . Polyethyl Glycol-Propyl Glycol (SYSTANE OP) Place 1 drop into both eyes daily as needed (dry eyes).    . rosuvastatin (CRESTOR) 20 MG tablet Take 1 tablet (20 mg total) by mouth daily. 90 tablet 3  . tadalafil (CIALIS) 5 MG tablet Take 5 mg by mouth at bedtime.     . triamterene-hydrochlorothiazide (MAXZIDE-25) 37.5-25 MG tablet Take 1 tablet by mouth daily. 90 tablet 3   No current facility-administered medications for this visit.    Allergies:   Miralax [polyethylene glycol] and Nsaids   Social History:  The patient  reports that he has never smoked. He has never used smokeless tobacco. He reports previous  alcohol use. He reports that he does not use drugs.   Family History:  The patient's family history includes Arrhythmia (age of onset: 92) in his brother; Arthritis in his mother; Cancer in his brother; Colon polyps in his mother; Congestive Heart Failure in his brother; Coronary artery disease in his brother and mother; Heart attack in his brother; Heart disease in his mother.   ROS:  Please see the history of present illness.   Otherwise, review of systems is positive for none.   All other systems are reviewed and negative.   PHYSICAL EXAM: VS:  BP 124/66   Pulse 65   Ht 5\' 9"  (1.753 m)   Wt 182 lb (82.6 kg)   SpO2 97%   BMI 26.88 kg/m  , BMI Body mass index is 26.88 kg/m. GEN: Well nourished, well developed, in no acute distress  HEENT: normal  Neck: no JVD, carotid bruits, or masses Cardiac: RRR; 3 of 6 harsh murmur at the base, no rubs, or gallops,no edema  Respiratory:  clear to auscultation bilaterally, normal work of breathing GI: soft, nontender, nondistended, + BS MS: no deformity or atrophy  Skin: warm and dry, device site well healed Neuro:  Strength and sensation are intact Psych: euthymic mood, full affect  EKG:  EKG is ordered today. Personal review of the ekg ordered shows AV paced  Personal review of the device interrogation today. Results in [facility]: 06/18/2020: ALT 12; BUN 15; Creatinine, Ser 0.89; Hemoglobin 12.4; Platelets 173.0; Potassium 4.1; Sodium 135; TSH 2.06    Lipid Panel     Component Value Date/Time   CHOL 147 05/21/2020 0721   TRIG 43 05/21/2020 0721   HDL 66 05/21/2020 0721   CHOLHDL 2.2 05/21/2020 0721   CHOLHDL 2.7 03/19/2016 0922   VLDL 14 03/19/2016 0922   LDLCALC 71 05/21/2020 0721     Wt Readings from Last 3 Encounters:  09/23/20 182 lb (82.6 kg)  09/03/20 183 lb (83 kg)  06/27/20 177 lb (80.3 kg)      Other studies Reviewed: Additional studies/ records that were reviewed today include: TTE 05/22/19 Review of  the above records today demonstrates:    1. The left ventricle has normal systolic function, with an ejection fraction of 55-60%. The cavity size was normal. Left ventricular diastolic Doppler parameters are consistent with pseudonormalization. Indeterminate filling pressures.  2. Apical  hypokinesis.  3. The right ventricle has normal systolic function. The cavity was normal. There is no increase in right ventricular wall thickness.  4. Left atrial size was severely dilated.  5. There is mild mitral annular calcification present. No evidence of mitral valve stenosis.  6. The aortic valve is tricuspid. Moderate thickening of the aortic valve. Moderate calcification of the aortic valve. Mild stenosis of the aortic valve.  7. There is mild to moderate dilatation of the ascending aorta measuring 43 mm.   ASSESSMENT AND PLAN:  1.  Paroxysmal atrial fibrillation: Currently on Eliquis and Multaq (ECG monitoring for high risk medication).  CHA2DS2-VASc of 3.  Minimal atrial fibrillation noted.  No changes.   2.  Hypertension: Currently well controlled  3.  Hyperlipidemia: Continue statin  4.  Coronary artery disease: Status post CABG 6.  Currently feeling well.  Plan per primary cardiology.  5.  Sick sinus syndrome: Status post Medtronic dual-chamber pacemaker implanted 05/12/2016.  Device functioning appropriately.  No changes at this time.    Current medicines are reviewed at length with the patient today.   The patient does not have concerns regarding his medicines.  The following changes were made today: None  Labs/ tests ordered today include:  Orders Placed This Encounter  Procedures  . EKG 12-Lead     Disposition:   FU with Admir Candelas 6 months  Signed, Genavie Boettger [name], MD  09/23/2020 2:21 PM     [address] [place] [phone] (office) [phone] (fax)

## 2020-09-23 NOTE — Patient Instructions (Addendum)
Medication Instructions:  Your physician recommends that you continue on your current medications as directed. Please refer to the Current Medication list given to you today.  *If you need a refill on your cardiac medications before your next appointment, please call your pharmacy*   Lab Work: None ordered   Testing/Procedures: None ordered   Follow-Up: At [facility], you and your health needs are our priority.  As part of our continuing mission to provide you with exceptional heart care, we have created designated Provider Care Teams.  These Care Teams include your primary Cardiologist (physician) and Advanced Practice Providers (APPs -  Physician Assistants and Nurse Practitioners) who all work together to provide you with the care you need, when you need it.  Remote monitoring is used to monitor your Pacemaker or ICD from home. This monitoring reduces the number of office visits required to check your device to one time per year. It allows [place] to keep an eye on the functioning of your device to ensure it is working properly. You are scheduled for a device check from home on 12/22/2020. You may send your transmission at any time that day. If you have a wireless device, the transmission will be sent automatically. After your physician reviews your transmission, you will receive a postcard with your next transmission date.  Your next appointment:   6 month(s)  The format for your next appointment:   In Person  Provider:   Allegra Lai, MD   Thank you for choosing [facility]!!   Trinidad Curet, RN [phone]    Other Instructions

## 2020-10-14 NOTE — Progress Notes (Signed)
Remote pacemaker transmission.   

## 2020-10-18 ENCOUNTER — Telehealth: Payer: Self-pay | Admitting: Family Medicine

## 2020-10-18 DIAGNOSIS — E559 Vitamin D deficiency, unspecified: Secondary | ICD-10-CM

## 2020-10-18 DIAGNOSIS — E785 Hyperlipidemia, unspecified: Secondary | ICD-10-CM

## 2020-10-18 NOTE — Telephone Encounter (Signed)
-----   Message from [place] Sia sent at 10/06/2020  2:35 PM EDT ----- Regarding: Lab orders for Wednesday, 10.27.21 Patient is scheduled for CPX labs, please order future labs, Thanks , Karna Christmas

## 2020-10-21 ENCOUNTER — Other Ambulatory Visit: Payer: Self-pay

## 2020-10-21 ENCOUNTER — Ambulatory Visit (INDEPENDENT_AMBULATORY_CARE_PROVIDER_SITE_OTHER): Payer: Medicare Other

## 2020-10-21 DIAGNOSIS — Z0000 Encounter for general adult medical examination without abnormal findings: Secondary | ICD-10-CM | POA: Diagnosis not present

## 2020-10-21 NOTE — Patient Instructions (Signed)
Derrick [name] , Thank you for taking time to come for your Medicare Wellness Visit. I appreciate your ongoing commitment to your health goals. Please review the following plan we discussed and let me know if I can assist you in the future.   Screening recommendations/referrals: Colonoscopy: Up to date, completed 11/08/2017, due 10/2020 Recommended yearly ophthalmology/optometry visit for glaucoma screening and checkup Recommended yearly dental visit for hygiene and checkup  Vaccinations: Influenza vaccine: Up to date, completed 09/22/2020, due 07/2021 Pneumococcal vaccine: Completed series Tdap vaccine: Up to date, completed 01/13/2016, due 12/2025 Shingles vaccine: Completed series   Covid-19: Completed series  Advanced directives: copy in chart   Conditions/risks identified: hypertension, hyperlipidemia  Next appointment: Follow up in one year for your annual wellness visit.   Preventive Care 70 Years and Older, Male Preventive care refers to lifestyle choices and visits with your health care provider that can promote health and wellness. What does preventive care include?  A yearly physical exam. This is also called an annual well check.  Dental exams once or twice a year.  Routine eye exams. Ask your health care provider how often you should have your eyes checked.  Personal lifestyle choices, including:  Daily care of your teeth and gums.  Regular physical activity.  Eating a healthy diet.  Avoiding tobacco and drug use.  Limiting alcohol use.  Practicing safe sex.  Taking low doses of aspirin every day.  Taking vitamin and mineral supplements as recommended by your health care provider. What happens during an annual well check? The services and screenings done by your health care provider during your annual well check will depend on your age, overall health, lifestyle risk factors, and family history of disease. Counseling  Your health care provider may ask you  questions about your:  Alcohol use.  Tobacco use.  Drug use.  Emotional well-being.  Home and relationship well-being.  Sexual activity.  Eating habits.  History of falls.  Memory and ability to understand (cognition).  Work and work Statistician. Screening  You may have the following tests or measurements:  Height, weight, and BMI.  Blood pressure.  Lipid and cholesterol levels. These may be checked every 5 years, or more frequently if you are over 48 years old.  Skin check.  Lung cancer screening. You may have this screening every year starting at age 66 if you have a 30-pack-year history of smoking and currently smoke or have quit within the past 15 years.  Fecal occult blood test (FOBT) of the stool. You may have this test every year starting at age 69.  Flexible sigmoidoscopy or colonoscopy. You may have a sigmoidoscopy every 5 years or a colonoscopy every 10 years starting at age 33.  Prostate cancer screening. Recommendations will vary depending on your family history and other risks.  Hepatitis C blood test.  Hepatitis B blood test.  Sexually transmitted disease (STD) testing.  Diabetes screening. This is done by checking your blood sugar (glucose) after you have not eaten for a while (fasting). You may have this done every 1-3 years.  Abdominal aortic aneurysm (AAA) screening. You may need this if you are a current or former smoker.  Osteoporosis. You may be screened starting at age 55 if you are at high risk. Talk with your health care provider about your test results, treatment options, and if necessary, the need for more tests. Vaccines  Your health care provider may recommend certain vaccines, such as:  Influenza vaccine. This is recommended every  year.  Tetanus, diphtheria, and acellular pertussis (Tdap, Td) vaccine. You may need a Td booster every 10 years.  Zoster vaccine. You may need this after age 71.  Pneumococcal 13-valent conjugate  (PCV13) vaccine. One dose is recommended after age 86.  Pneumococcal polysaccharide (PPSV23) vaccine. One dose is recommended after age 40. Talk to your health care provider about which screenings and vaccines you need and how often you need them. This information is not intended to replace advice given to you by your health care provider. Make sure you discuss any questions you have with your health care provider. Document Released: 01/09/2016 Document Revised: 09/01/2016 Document Reviewed: 10/14/2015 Elsevier Interactive Patient Education  2017 [place] Prevention in the Home Falls can cause injuries. They can happen to people of all ages. There are many things you can do to make your home safe and to help prevent falls. What can I do on the outside of my home?  Regularly fix the edges of walkways and driveways and fix any cracks.  Remove anything that might make you trip as you walk through a door, such as a raised step or threshold.  Trim any bushes or trees on the path to your home.  Use bright outdoor lighting.  Clear any walking paths of anything that might make someone trip, such as rocks or tools.  Regularly check to see if handrails are loose or broken. Make sure that both sides of any steps have handrails.  Any raised decks and porches should have guardrails on the edges.  Have any leaves, snow, or ice cleared regularly.  Use sand or salt on walking paths during winter.  Clean up any spills in your garage right away. This includes oil or grease spills. What can I do in the bathroom?  Use night lights.  Install grab bars by the toilet and in the tub and shower. Do not use towel bars as grab bars.  Use non-skid mats or decals in the tub or shower.  If you need to sit down in the shower, use a plastic, non-slip stool.  Keep the floor dry. Clean up any water that spills on the floor as soon as it happens.  Remove soap buildup in the tub or shower  regularly.  Attach bath mats securely with double-sided non-slip rug tape.  Do not have throw rugs and other things on the floor that can make you trip. What can I do in the bedroom?  Use night lights.  Make sure that you have a light by your bed that is easy to reach.  Do not use any sheets or blankets that are too big for your bed. They should not hang down onto the floor.  Have a firm chair that has side arms. You can use this for support while you get dressed.  Do not have throw rugs and other things on the floor that can make you trip. What can I do in the kitchen?  Clean up any spills right away.  Avoid walking on wet floors.  Keep items that you use a lot in easy-to-reach places.  If you need to reach something above you, use a strong step stool that has a grab bar.  Keep electrical cords out of the way.  Do not use floor polish or wax that makes floors slippery. If you must use wax, use non-skid floor wax.  Do not have throw rugs and other things on the floor that can make you trip. What can  I do with my stairs?  Do not leave any items on the stairs.  Make sure that there are handrails on both sides of the stairs and use them. Fix handrails that are broken or loose. Make sure that handrails are as long as the stairways.  Check any carpeting to make sure that it is firmly attached to the stairs. Fix any carpet that is loose or worn.  Avoid having throw rugs at the top or bottom of the stairs. If you do have throw rugs, attach them to the floor with carpet tape.  Make sure that you have a light switch at the top of the stairs and the bottom of the stairs. If you do not have them, ask someone to add them for you. What else can I do to help prevent falls?  Wear shoes that:  Do not have high heels.  Have rubber bottoms.  Are comfortable and fit you well.  Are closed at the toe. Do not wear sandals.  If you use a stepladder:  Make sure that it is fully  opened. Do not climb a closed stepladder.  Make sure that both sides of the stepladder are locked into place.  Ask someone to hold it for you, if possible.  Clearly mark and make sure that you can see:  Any grab bars or handrails.  First and last steps.  Where the edge of each step is.  Use tools that help you move around (mobility aids) if they are needed. These include:  Canes.  Walkers.  Scooters.  Crutches.  Turn on the lights when you go into a dark area. Replace any light bulbs as soon as they burn out.  Set up your furniture so you have a clear path. Avoid moving your furniture around.  If any of your floors are uneven, fix them.  If there are any pets around you, be aware of where they are.  Review your medicines with your doctor. Some medicines can make you feel dizzy. This can increase your chance of falling. Ask your doctor what other things that you can do to help prevent falls. This information is not intended to replace advice given to you by your health care provider. Make sure you discuss any questions you have with your health care provider. Document Released: 10/09/2009 Document Revised: 05/20/2016 Document Reviewed: 01/17/2015 Elsevier Interactive Patient Education  2017 [employer].

## 2020-10-21 NOTE — Progress Notes (Signed)
PCP notes:  Health Maintenance: No gaps noted   Abnormal Screenings: none   Patient concerns: Chronic extreme fatigue   Nurse concerns: none   Next PCP appt.: 10/28/2020 @ 8:40 am

## 2020-10-21 NOTE — Progress Notes (Signed)
Subjective:   Derrick [name] is a 79 y.o. male who presents for Medicare Annual/Subsequent preventive examination.  Review of Systems: N/A     I connected with the patient today by telephone and verified that I am speaking with the correct person using two identifiers. Location patient: home Location nurse: work Persons participating in the telephone visit: patient, nurse.   I discussed the limitations, risks, security and privacy concerns of performing an evaluation and management service by telephone and the availability of in person appointments. I also discussed with the patient that there may be a patient responsible charge related to this service. The patient expressed understanding and verbally consented to this telephonic visit.        Cardiac Risk Factors include: advanced age (>60men, >4 women);hypertension;male gender;Other (see comment), Risk factor comments: hyperlipidemia     Objective:    Today's Vitals   10/21/20 0918  PainSc: 5    There is no height or weight on file to calculate BMI.  Advanced Directives 10/21/2020 03/11/2020 03/04/2020 09/26/2019 06/08/2019 06/06/2019 06/04/2019  Does Patient Have a Medical Advance Directive? Yes Yes Yes Yes - Yes Yes  Type of Advance Directive [place];Living will [place];Living will Living will;Healthcare Power of [place];Living will [place];Living will [place];Living will [place];Living will  Does patient want to make changes to medical advance directive? - No - Patient declined No - Patient declined - No - Patient declined - No - Patient declined  Copy of [place] in Chart? Yes - validated most recent copy scanned in chart (See row information) No - copy requested No - copy requested Yes - validated most recent copy scanned in chart (See row information) - Yes - validated most  recent copy scanned in chart (See row information) No - copy requested  Would patient like information on creating a medical advance directive? - - - - - - -    Current Medications (verified) Outpatient Encounter Medications as of 10/21/2020  Medication Sig  . albuterol (VENTOLIN HFA) 108 (90 Base) MCG/ACT inhaler Inhale 2 puffs into the lungs every 6 (six) hours as needed for wheezing or shortness of breath.  [name] amLODipine (NORVASC) 5 MG tablet Take 1 tablet (5 mg total) by mouth daily.  . bimatoprost (LUMIGAN) 0.01 % SOLN Place 1 drop into both eyes at bedtime.  . Cholecalciferol 25 MCG (1000 UT) tablet Take 1,000 Units by mouth daily.   . dorzolamide-timolol (COSOPT) 22.3-6.8 MG/ML ophthalmic solution Place 1 drop into both eyes 2 (two) times daily.   [name] ELIQUIS 5 MG TABS tablet Take 1 tablet by mouth twice daily  . finasteride (PROSCAR) 5 MG tablet Take 5 mg by mouth daily after breakfast.   . fluticasone (FLONASE) 50 MCG/ACT nasal spray Place 2 sprays into both nostrils 2 (two) times daily.  . Fluticasone Propionate, Inhal, (FLOVENT DISKUS) 100 MCG/BLIST AEPB Inhale 1 Inhaler into the lungs 2 (two) times daily.  . MULTAQ 400 MG tablet TAKE 1 TABLET BY MOUTH TWICE DAILY WITH MEALS  . Multiple Vitamin (MULTIVITAMIN WITH MINERALS) TABS tablet Take 1 tablet by mouth daily.  . pantoprazole (PROTONIX) 40 MG tablet Take 1 tablet by mouth once daily  . Polyethyl Glycol-Propyl Glycol (SYSTANE OP) Place 1 drop into both eyes daily as needed (dry eyes).  . rosuvastatin (CRESTOR) 20 MG tablet Take 1 tablet (20 mg total) by mouth daily.  [name]  tadalafil (CIALIS) 5 MG tablet Take 5 mg by mouth at bedtime.   . triamterene-hydrochlorothiazide (MAXZIDE-25) 37.5-25 MG tablet Take 1 tablet by mouth daily.   No facility-administered encounter medications on file as of 10/21/2020.    Allergies (verified) Miralax [polyethylene glycol] and Nsaids   History: Past Medical History:  Diagnosis Date  . Anemia      iron deficiency anemia- infusion on 02/15/2020 and 02/22/2020  . BENIGN PROSTATIC HYPERTROPHY, WITH OBSTRUCTION 05/28/2010  . CAD (coronary artery disease)    a. s/p NSTEMI 06/01/11: DES to RCA;  b. cath 06/25/11:   dLM 50-60% (FFR 0.87), prox to mid LAD 40-50%, D1 50%, pCFX 50%, RCA stent ok, dPDA 80%, EF 55-60%.  His FFR was felt to be negative and therefore medical therapy was recommended ;  echo 6/12: EF 55-60%, mild AS   . Chronic back pain   . CIDP (chronic inflammatory demyelinating polyneuropathy) ([place]) 04/11/2012  . Colon cancer ([place]) dx'd 2000   "left"  . COLONIC POLYPS, ADENOMATOUS, HX OF 05/28/2010  . Complication of anesthesia    "stopped breathing; related to my sleep apnea" & urinary retention   . COPD (chronic obstructive pulmonary disease) ([place])    "associated w/lung sarcoidosis"  . 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 DISH (diffuse idiopathic skeletal hyperostosis) 12/02/2015  . Diverticulitis   . GENERALIZED OSTEOARTHROSIS UNSPECIFIED SITE 05/28/2010  . GERD 05/28/2010  . Heart murmur   . History of gout   . History of kidney stones   . HYPERLIPIDEMIA 05/28/2010  . Hypertension   . IgM lambda paraproteinemia   . 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 Malignant neoplasm of descending colon ([place]) 05/28/2010  . MITRAL VALVE PROLAPSE 05/28/2010  . 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 NSTEMI (non-ST elevated myocardial infarction) ([place]) 06/03/11  . 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  . OSA on CPAP 05/28/2010  . PALPITATIONS, CHRONIC 05/28/2010  . Parotid gland pain 2010   infection  . Peripheral neuropathy    "tx'd w/targeted chemo" (05/12/2016)  . Pneumonia 2-3 times  . Presence of permanent cardiac pacemaker    Medtronic  . 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 Sarcoidosis    pulmonalis  . UNSPECIFIED INFLAMMATORY AND TOXIC NEUROPATHY 05/28/2010  . Vision loss   . VITAMIN D DEFICIENCY 05/28/2010   Past Surgical  History:  Procedure Laterality Date  . APPENDECTOMY  1954   age 32  . CATARACT EXTRACTION W/ INTRAOCULAR LENS IMPLANT Left   . COLON SURGERY  2000   decending colon   . CORONARY ANGIOPLASTY WITH STENT PLACEMENT  06/03/11  . CORONARY ARTERY BYPASS GRAFT N/A 06/05/2019   Procedure: CORONARY ARTERY BYPASS GRAFTING (CABG) TIMES THREE USING LEFT INTERNAL MAMMARY ARTERY AND RIGHT GREATER SEPHANOUS VEIN;  Surgeon: [name], MD;  Location: [facility];  Service: Open Heart Surgery;  Laterality: N/A;  . ELECTROPHYSIOLOGIC STUDY N/A 05/12/2016   Procedure: Cardioversion;  Surgeon: [name], MD;  Location: [facility];  Service: Cardiovascular;  Laterality: N/A;  . EP IMPLANTABLE DEVICE N/A 05/12/2016   Procedure: Pacemaker Implant;  Surgeon: [name], MD;  Location: [facility];  Service: Cardiovascular;  Laterality: N/A;  . EYE SURGERY     bilateral cataract with lens implant  . HERNIA REPAIR  01/2019   left inguinal hernia repair  . INGUINAL HERNIA REPAIR Left 02/21/2019   Procedure: LEFT INGUINAL HERNIA REPAIR WITH MESH;  Surgeon: [name], MD;  Location[facility];  Service: General;  Laterality: Left;  . INSERT / REPLACE / REMOVE PACEMAKER  05/12/2016  . INTRAVASCULAR PRESSURE WIRE/FFR STUDY N/A 05/15/2019   Procedure: INTRAVASCULAR PRESSURE WIRE/FFR STUDY;  Surgeon: [name], MD;  Location: [facility];  Service: Cardiovascular;  Laterality: N/A;  . KNEE ARTHROSCOPY Right 2003  . LEFT HEART CATH AND CORONARY ANGIOGRAPHY N/A 05/15/2019   Procedure: LEFT HEART CATH AND CORONARY ANGIOGRAPHY;  Surgeon: [name], MD;  Location: [facility];  Service: Cardiovascular;  Laterality: N/A;  . LEFT HEART CATHETERIZATION WITH CORONARY ANGIOGRAM N/A 03/31/2015   Procedure: LEFT HEART CATHETERIZATION WITH CORONARY ANGIOGRAM;  Surgeon: [name], MD;  Location: [facility];  Service: Cardiovascular;  Laterality: N/A;  . LUNG SURGERY  2001   "open  lung dissection"  . MOHS SURGERY Left ~ 2008   ear  . PROSTATE BIOPSY  2010  . TEE WITHOUT CARDIOVERSION N/A 06/05/2019   Procedure: TRANSESOPHAGEAL ECHOCARDIOGRAM (TEE);  Surgeon: [name], [name], MD;  Location: [facility];  Service: Open Heart Surgery;  Laterality: N/A;  . TONSILLECTOMY  1950  . TOTAL KNEE ARTHROPLASTY Right 03/11/2020   Procedure: TOTAL KNEE ARTHROPLASTY;  Surgeon: [name], MD;  Location: [facility];  Service: Orthopedics;  Laterality: Right;  70 mins   Family History  Problem Relation Age of Onset  . Arthritis Mother        severe  . Coronary artery disease Mother   . Colon polyps Mother   . Heart disease Mother   . Coronary artery disease Brother   . Cancer Brother        choriocarcinoma  . Arrhythmia Brother 59       Afib/Tachycardia  . Heart attack Brother   . Congestive Heart Failure Brother    Social History   Socioeconomic History  . Marital status: Married    Spouse name: Not on file  . Number of children: 2  . Years of education: MS-Coll.  . Highest education level: Not on file  Occupational History  . Occupation: Optometrist    Comment: Building services engineer  Tobacco Use  . Smoking status: Never Smoker  . Smokeless tobacco: Never Used  Vaping Use  . Vaping Use: Never used  Substance and Sexual Activity  . Alcohol use: Yes    Comment: occasional  . Drug use: No  . Sexual activity: Yes    Birth control/protection: None  Other Topics Concern  . Not on file  Social History Narrative   College- [employer]; [employer]-environment science and mgt. Married [name])  "81" 1 son- 48; 1 dtr "28" 2 grandchildren. 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 End of life-provided discussive context and provided packet.   Social Determinants of Health   Financial Resource Strain: Low Risk   . Difficulty of Paying Living Expenses: Not hard at all  Food Insecurity: No Food Insecurity  . Worried About Charity fundraiser in the Last Year: Never true    . Ran Out of Food in the Last Year: Never true  Transportation Needs: No Transportation Needs  . Lack of Transportation (Medical): No  . Lack of Transportation (Non-Medical): No  Physical Activity: Sufficiently Active  . Days of Exercise per Week: 7 days  . Minutes of Exercise per Session: 60 min  Stress: No Stress Concern Present  . Feeling of Stress : Not at all  Social Connections:   . Frequency of Communication with Friends and Family: Not on file  .  Frequency of Social Gatherings with Friends and Family: Not on file  . Attends Religious Services: Not on file  . Active Member of Clubs or Organizations: Not on file  . Attends Archivist Meetings: Not on file  . Marital Status: Not on file    Tobacco Counseling Counseling given: Not Answered   Clinical Intake:  Pre-visit preparation completed: Yes  Pain : 0-10 Pain Score: 5  Pain Type: Chronic pain Pain Location: Knee Pain Orientation: Right Pain Descriptors / Indicators: Aching Pain Onset: More than a month ago Pain Frequency: Intermittent     Nutritional Risks: None Diabetes: No  How often do you need to have someone help you when you read instructions, pamphlets, or other written materials from your doctor or pharmacy?: 1 - Never What is the last grade level you completed in school?: Masters  Diabetic: No Nutrition Risk Assessment:  Has the patient had any N/V/D within the last 2 months?  No  Does the patient have any non-healing wounds?  No  Has the patient had any unintentional weight loss or weight gain?  No   Diabetes:  Is the patient diabetic?  No  If diabetic, was a CBG obtained today?  N/A Did the patient bring in their glucometer from home?  N/A How often do you monitor your CBG's? N/A.   Financial Strains and Diabetes Management:  Are you having any financial strains with the device, your supplies or your medication? N/A.  Does the patient want to be seen by Chronic Care Management  for management of their diabetes?  N/A Would the patient like to be referred to a Nutritionist or for Diabetic Management?  N/A     Interpreter Needed?: No  Information entered by :: CJohnson, LPN   Activities of Daily Living In your present state of health, do you have any difficulty performing the following activities: 10/21/2020 03/11/2020  Hearing? Y -  Comment some hearing loss noted -  Vision? N -  Difficulty concentrating or making decisions? N -  Walking or climbing stairs? N -  Dressing or bathing? N -  Doing errands, shopping? N N  Preparing Food and eating ? N -  Using the Toilet? N -  In the past six months, have you accidently leaked urine? N -  Do you have problems with loss of bowel control? N -  Managing your Medications? N -  Managing your Finances? N -  Housekeeping or managing your Housekeeping? N -  Some recent data might be hidden    Patient Care Team: [name], MD as PCP - General (Family Medicine) [name], MD as PCP - Electrophysiology (Cardiology) [name], MD as PCP - Cardiology (Cardiology) Heath Lark, MD as Consulting Physician (Hematology and Oncology) Debbora Dus, [facility] as Pharmacist (Pharmacist)  Indicate any recent Medical Services you may have received from other than [facility] providers in the past year (date may be approximate).     Assessment:   This is a routine wellness examination for [place].  Hearing/Vision screen  Hearing Screening   125Hz  250Hz  500Hz  1000Hz  2000Hz  3000Hz  4000Hz  6000Hz  8000Hz   Right ear:           Left ear:           Vision Screening Comments: Patient gets annual eye exams   Dietary issues and exercise activities discussed: Current Exercise Habits: Home exercise routine, Type of exercise: walking, Time (Minutes): 60, Frequency (Times/Week): 7, Weekly Exercise (Minutes/Week): 420, Intensity: Moderate, Exercise  limited by: None identified  Goals    . DIET - INCREASE WATER INTAKE      Starting 06/13/2018, I will continue to drink at least 2 liters of water daily.     . Patient Stated     09/26/2019, Patient wants to focus on improving his energy level after just having heart surgery    . Patient Stated     10/21/2020, I will continue to walk everyday for 2 miles.     . Pharmacy Care Plan     CARE PLAN ENTRY  Current Barriers:  . Chronic Disease Management support, education, and care coordination needs related to coronary artery disease, hypertension, A-fib, allergic rhinitis, sleep apnea, GERD, neuropathy, osteoarthritis, vitamin D deficiency, hyperlipidemia, BPH, iron deficiency anemia  Pharmacist Clinical Goal(s):  [name] Improve cholesterol to goal of LDL less than 70 mg/dL. Discuss increasing rosuvastatin from 10 mg to 20 mg with Dr. [name]. Repeat lipid panel within 12 weeks.   Interventions: . Comprehensive medication review performed.  Patient Self Care Activities:  . Self administers medications as prescribed  Initial goal documentation      Depression Screen PHQ 2/9 Scores 10/21/2020 06/27/2020 09/26/2019 08/22/2019 07/16/2019 07/10/2019 06/13/2018  PHQ - 2 Score 0 0 0 0 0 0 0  PHQ- 9 Score 0 - 0 - - - 4    Fall Risk Fall Risk  10/21/2020 11/05/2019 09/26/2019 07/26/2018 06/13/2018  Falls in the past year? 0 0 1 Yes Yes  Number falls in past yr: 0 - 0 2 or more 2 or more  Injury with Fall? 0 - 0 No No  Risk for fall due to : Medication side effect - Medication side effect;Impaired balance/gait Other (Comment) Impaired balance/gait  Risk for fall due to: Comment - - - neuropathy -  Follow up Falls evaluation completed;Falls prevention discussed - Falls evaluation completed;Falls prevention discussed - -    Any stairs in or around the home? Yes  If so, are there any without handrails? No  Home free of loose throw rugs in walkways, pet beds, electrical cords, etc? Yes  Adequate lighting in your home to reduce risk of falls? Yes   ASSISTIVE DEVICES UTILIZED TO  PREVENT FALLS:  Life alert? No  Use of a cane, walker or w/c? Yes , cane Grab bars in the bathroom? No  Shower chair or bench in shower? No  Elevated toilet seat or a handicapped toilet? No   TIMED UP AND GO:  Was the test performed? N/A, telephonic visit .   Cognitive Function: MMSE - Mini Mental State Exam 10/21/2020 09/26/2019 06/13/2018  Orientation to time 5 5 5   Orientation to Place 5 5 5   Registration 3 3 3   Attention/ Calculation 5 5 0  Recall 3 2 1   Recall-comments - - unable to recall 2 of 3 words  Language- name 2 objects - - 0  Language- repeat 1 1 1   Language- follow 3 step command - - 3  Language- read & follow direction - - 0  Write a sentence - - 0  Copy design - - 0  Total score - - 18  Mini Cog  Mini-Cog screen was completed. Maximum score is 22. A value of 0 denotes this part of the MMSE was not completed or the patient failed this part of the Mini-Cog screening.  Montreal Cognitive Assessment  11/05/2019 07/26/2018 01/26/2018  Visuospatial/ Executive (0/5) 4 2 4   Naming (0/3) 3 3 3   Attention: Read list of digits (  0/2) 2 2 1   Attention: Read list of letters (0/1) 1 1 1   Attention: Serial 7 subtraction starting at 100 (0/3) 3 3 3   Language: Repeat phrase (0/2) 2 2 1   Language : Fluency (0/1) 1 1 0  Abstraction (0/2) 2 2 2   Delayed Recall (0/5) 5 4 3   Orientation (0/6) 6 5 6   Total 29 25 24       Immunizations Immunization History  Administered Date(s) Administered  . Fluad Quad(high Dose 65+) 08/14/2019, 09/22/2020  . Influenza Split 09/26/2012  . Influenza, High Dose Seasonal PF 09/02/2015, 08/24/2016, 09/04/2018  . Influenza,inj,Quad PF,6+ Mos 09/25/2013, 09/12/2014  . Influenza-Unspecified 09/15/2017, 09/05/2018  . PFIZER SARS-COV-2 Vaccination 01/11/2020, 02/01/2020, 09/22/2020  . Pneumococcal Conjugate-13 12/13/2013  . Pneumococcal Polysaccharide-23 02/09/2016  . Tdap 01/13/2016  . Zoster Recombinat (Shingrix) 12/21/2017, 02/24/2018     TDAP status: Up to date Flu Vaccine status: Up to date Pneumococcal vaccine status: Up to date Covid-19 vaccine status: Completed vaccines  Qualifies for Shingles Vaccine? Yes   Zostavax completed No   Shingrix Completed?: Yes  Screening Tests Health Maintenance  Topic Date Due  . Hepatitis C Screening  Never done  . COLONOSCOPY  11/08/2020  . TETANUS/TDAP  01/12/2026  . INFLUENZA VACCINE  Completed  . COVID-19 Vaccine  Completed  . PNA vac Low Risk Adult  Completed    Health Maintenance  Health Maintenance Due  Topic Date Due  . Hepatitis C Screening  Never done    Colorectal cancer screening: Completed 11/08/2017. Repeat every 3 years  Lung Cancer Screening: (Low Dose CT Chest recommended if Age 74-80 years, 30 pack-year currently smoking OR have quit w/in 15years.) does not qualify.    Additional Screening:  Hepatitis C Screening: does qualify; Completed due  Vision Screening: Recommended annual ophthalmology exams for early detection of glaucoma and other disorders of the eye. Is the patient up to date with their annual eye exam?  Yes  Who is the provider or what is the name of the office in which the patient attends annual eye exams? Dr. Satira Sark If pt is not established with a provider, would they like to be referred to a provider to establish care? No .   Dental Screening: Recommended annual dental exams for proper oral hygiene  Community Resource Referral / Chronic Care Management: CRR required this visit?  No   CCM required this visit?  No      Plan:     I have personally reviewed and noted the following in the patient's chart:   . Medical and social history . Use of alcohol, tobacco or illicit drugs  . Current medications and supplements . Functional ability and status . Nutritional status . Physical activity . Advanced directives . List of other physicians . Hospitalizations, surgeries, and ER visits in previous 12  months . Vitals . Screenings to include cognitive, depression, and falls . Referrals and appointments  In addition, I have reviewed and discussed with patient certain preventive protocols, quality metrics, and best practice recommendations. A written personalized care plan for preventive services as well as general preventive health recommendations were provided to patient.   Due to this being a telephonic visit, the after visit summary with patients personalized plan was offered to patient via office or my-chart. Patient preferred to pick up at office at next visit or via mychart.   Andrez Grime, LPN   75/17/[id number]

## 2020-10-22 ENCOUNTER — Other Ambulatory Visit: Payer: Self-pay

## 2020-10-22 ENCOUNTER — Other Ambulatory Visit (INDEPENDENT_AMBULATORY_CARE_PROVIDER_SITE_OTHER): Payer: Medicare Other

## 2020-10-22 DIAGNOSIS — E785 Hyperlipidemia, unspecified: Secondary | ICD-10-CM

## 2020-10-22 LAB — COMPREHENSIVE METABOLIC PANEL
ALT: 12 U/L (ref 0–53)
AST: 19 U/L (ref 0–37)
Albumin: 4.3 g/dL (ref 3.5–5.2)
Alkaline Phosphatase: 49 U/L (ref 39–117)
BUN: 15 mg/dL (ref 6–23)
CO2: 29 mEq/L (ref 19–32)
Calcium: 10.3 mg/dL (ref 8.4–10.5)
Chloride: 101 mEq/L (ref 96–112)
Creatinine, Ser: 1.01 mg/dL (ref 0.40–1.50)
GFR: 71.71 mL/min (ref >60.00)
Glucose, Bld: 93 mg/dL (ref 70–99)
Potassium: 4 mEq/L (ref 3.5–5.1)
Sodium: 137 mEq/L (ref 135–145)
Total Bilirubin: 0.9 mg/dL (ref 0.2–1.2)
Total Protein: 6.8 g/dL (ref 6.0–8.3)

## 2020-10-22 LAB — LIPID PANEL
Cholesterol: 152 mg/dL (ref 0–200)
HDL: 71.9 mg/dL (ref >39.00)
LDL Cholesterol: 70 mg/dL (ref 0–99)
NonHDL: 79.75
Total CHOL/HDL Ratio: 2
Triglycerides: 47 mg/dL (ref 0.0–149.0)
VLDL: 9.4 mg/dL (ref 0.0–40.0)

## 2020-10-23 NOTE — Progress Notes (Signed)
No critical labs need to be addressed urgently. We will discuss labs in detail at upcoming office visit.   

## 2020-10-27 ENCOUNTER — Encounter: Payer: Self-pay | Admitting: Nurse Practitioner

## 2020-10-27 ENCOUNTER — Telehealth: Payer: Self-pay

## 2020-10-27 ENCOUNTER — Ambulatory Visit (INDEPENDENT_AMBULATORY_CARE_PROVIDER_SITE_OTHER): Payer: Medicare Other | Admitting: Nurse Practitioner

## 2020-10-27 VITALS — BP 128/60 | HR 73 | Ht 69.0 in | Wt 186.0 lb

## 2020-10-27 DIAGNOSIS — I25810 Atherosclerosis of coronary artery bypass graft(s) without angina pectoris: Secondary | ICD-10-CM

## 2020-10-27 DIAGNOSIS — K219 Gastro-esophageal reflux disease without esophagitis: Secondary | ICD-10-CM

## 2020-10-27 DIAGNOSIS — Z86010 Personal history of colonic polyps: Secondary | ICD-10-CM | POA: Diagnosis not present

## 2020-10-27 DIAGNOSIS — Z86004 Personal history of in-situ neoplasm of other and unspecified digestive organs: Secondary | ICD-10-CM | POA: Diagnosis not present

## 2020-10-27 DIAGNOSIS — I480 Paroxysmal atrial fibrillation: Secondary | ICD-10-CM

## 2020-10-27 MED ORDER — SUTAB 1479-225-188 MG PO TABS: 1.0000 | ORAL_TABLET | ORAL | 0 refills | Status: DC

## 2020-10-27 NOTE — Patient Instructions (Signed)
If you are age 77 or older, your body mass index should be between 23-30. Your Body mass index is 27.47 kg/m. If this is out of the aforementioned range listed, please consider follow up with your Primary Care Provider.  If you are age 109 or younger, your body mass index should be between 19-25. Your Body mass index is 27.47 kg/m. If this is out of the aformentioned range listed, please consider follow up with your Primary Care Provider.   You have been scheduled for a colonoscopy. Please follow written instructions given to you at your visit today.  Please pick up your prep supplies at the pharmacy within the next 1-3 days. If you use inhalers (even only as needed), please bring them with you on the day of your procedure.  You will be contacted by our office prior to your procedure for directions on holding your Eliquis.  If you do not hear from our office 1 week prior to your scheduled procedure, please call [phone] to discuss.   Jaclyn Shaggy recommends that you have a CBC drawn when you see your primary care physician.  Follow up pending the results of your Colonoscopy.

## 2020-10-27 NOTE — Telephone Encounter (Signed)
Patient with diagnosis of atrial fibrillation on Eliquis for anticoagulation.    Procedure: Colonoscopy Date of procedure: 11/28/20  CHA2DS2-VASc Score = 4  This indicates a 4.8% annual risk of stroke. The patient's score is based upon: CHF History: 0 HTN History: 1 Diabetes History: 0 Stroke History: 0 Vascular Disease History: 1 Age Score: 2 Gender Score: 0   CrCl 73 Platelet count 173  Patient can hold Eliquis for 2 days prior to procedure per office request.

## 2020-10-27 NOTE — Telephone Encounter (Signed)
[facility] Pre-operative Risk Assessment     Request for surgical clearance:     Endoscopy Procedure  What type of surgery is being performed?     Colonoscopy  When is this surgery scheduled?     11/28/20  What type of clearance is required ?   Pharmacy  Are there any medications that need to be held prior to surgery and how long? Eliquis 2 days prior  Practice name and name of physician performing surgery?      [facility]  What is your office phone and fax number?      Phone- [phone]  [phone]  Anesthesia type (None, local, MAC, general) ?       MAC

## 2020-10-27 NOTE — Progress Notes (Signed)
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  S/P left inguinal hernia repair 01/2019 and right  total knee replacement surgery 03/11/2020. He is followed by Dr. Hilarie Fredrickson. His most recent surveillance colonoscopy was 05/08/2017 which identified a 10 mm tubular adenomatous polyp to the ascending colon, one 3 mm tubular adenomatous polyp to the ascending colon and two lymphoid/benign colonic polyps to the descending colon which were removed.  His prior colonic anastomosis site was intact.  He was advised to repeat a colonoscopy in 3 years. He complains of having diarrhea approximately once weekly unrelated to stress or specific foods.  No bloody diarrhea.  He otherwise passes a normal formed brown bowel movement daily.  No rectal bleeding.  No melena. He has occasional brief left mid abdominal pain which he associates to having gas.  No current abdominal pain.  No fever, sweats or chills.  His weight is stable.  He complains of chronic fatigue. His GERD symptoms are well controlled on Pantoprazole 40 mg once daily. No heartburn or dysphagia. He underwent an EGD by Dr. Hilarie Fredrickson on 09/09/2014 which was normal, no evidence of Barrett's esophagus.  He was last seen in office by his hematologist Dr. Alvy Bimler 04/14/2020. At that time, Dr. Alvy Bimler did not assess his IDA was etiology for his chronic fatigue. He received IV iron infusions  01/2020 prior to his left  knee replacement surgery. He had poor tolerance to oral iron in the past. His last iron level was 36 on 06/18/2020.   Significant history of coronary artery disease as noted above. He is followed closely by cardiologist Dr. Burt Knack. He denies having any chest pain or palpitations. No cough or SOB. He was last seen by Dr. Burt Knack in office 09/03/2020 and at that time his cardiac status was stable. He was advised to stop taking aspirin due to easy bruising and bleeding. He remains on Eliquis. Plans to repeat an echo in 1 year.  CBC Latest Ref Rng & Units 06/18/2020 04/14/2020 03/12/2020  WBC 4.0 - 10.5 K/uL 6.1 3.4(L) 7.4  Hemoglobin 13.0 - 17.0 g/dL 12.4(L) 11.7(L) 10.0(L)  Hematocrit 39 - 52 % 36.5(L) 36.5(L) 31.3(L)  Platelets 150 - 400 K/uL 173.0 179 164    CMP Latest Ref Rng & Units 10/22/2020 06/18/2020 05/21/2020  Glucose 70 - 99 mg/dL 93 101(H) -  BUN 6 - 23 mg/dL 15 15 -  Creatinine 0.40 - 1.50 mg/dL 1.01 0.89 -  Sodium 135 - 145 mEq/L 137 135 -  Potassium 3.5 - 5.1 mEq/L 4.0 4.1 -  Chloride 96 - 112 mEq/L 101 98 -  CO2 19 - 32 mEq/L 29 29 -  Calcium 8.4 - 10.5 mg/dL 10.3 10.6(H) -  Total Protein 6.0 - 8.3 g/dL 6.8 6.7 6.6  Total Bilirubin 0.2 - 1.2 mg/dL 0.9 0.9 0.4  Alkaline Phos 39 - 117 U/L 49 59 54  AST 0 - 37 U/L 19 15 22   ALT 0 - 53 U/L 12 12 11     Colonoscopy  05/08/2017: - One 10 mm polyp in the ascending colon, removed with a cold snare. Resected and retrieved. - One 3 mm polyp in the ascending colon, removed with a cold snare. Resected and retrieved. - Two 3 to 5 mm polyps in the descending colon, removed with a cold snare. Resected and retrieved. - Patent end-to-end colo-colonic anastomosis, characterized by healthy appearing mucosa. - Small internal hemorrhoids. - 3 year recall 1. Surgical [P], descending, polyp (2) - POLYPOID FRAGMENTS OF COLONIC MUCOSA WITH LYMPHOID AGGREGATE(S) (1 OF 2 FRAGMENTS) - BENIGN COLONIC MUCOSA (1 OF 2 FRAGMENTS) - NO HIGH GRADE DYSPLASIA OR  MALIGNANCY IDENTIFIED 2. Surgical [P], ascending, polyp (2) - MULTIPLE FRAGMENTS OF TUBULAR ADENOMA(S) - NO HIGH GRADE DYSPLASIA OR MALIGNANCY IDENTIFIED  Colonoscopy 09/09/2014: 1. Three sessile polyps measuring 3, 3, and 8 mm in size were found at the cecum and in the ascending colon; Polypectomy was performed using cold snare; 1 hemostatic clip used at ascending colon polypectomy base to control oozing with success 2. Two sessile polyps ranging between 3-64mm in size were found in the descending colon and rectosigmoid colon; Polypectomy was performed using cold snare 3. There was evidence of prior surgical anastomosis in the sigmoid colon 1. Surgical [P], GE junction, z-line - BENIGN GASTROESOPHAGEAL JUNCTION MUCOSA. - NO INTESTINAL METAPLASIA, DYSPLASIA, OR MALIGNANCY. 2. Surgical [P], cecum and ascending, polyp (3) - TUBULAR ADENOMA (2) AND SESSILE SERRATED POLYP (1). NO HIGH GRADE DYSPLASIA OR MALIGNANCY IDENTIFIED. 3. Surgical [P], descending and rectosigmoid, polyp (2) - TUBULAR ADENOMA (2). NO HIGH GRADE DYSPLASIA OR MALIGNANCY IDENTIFIED  EGD 09/09/2014: 1. Slightly irregular Z-line was observed 40 cm from the incisors; multiple biopsies 2. The esophagus was otherwise normal. 3. The mucosa of the stomach appeared normal 4. The duodenal mucosa showed no abnormalities in the bulb and second portion of the duodenum  ECHO 05/21/2020: 1. Left ventricular ejection fraction, by estimation, is 60 to 65%. Left ventricular ejection fraction by 3D volume is 62 %. The left ventricle has normal function. Abnormal (paradoxical) septal motion, consistent with RV pacemaker. There is mild left ventricular hypertrophy. Left ventricular diastolic parameters are consistent with Grade II diastolic dysfunction (pseudonormalization). Elevated left atrial pressure. 2. Right ventricular systolic function is mildly reduced. The right ventricular size is mildly enlarged. There is normal pulmonary artery  systolic pressure. The estimated right ventricular systolic pressure is 38.1 mmHg. 3. Left atrial size was moderately dilated. 4. Right atrial size was mildly dilated. 5. The mitral valve is normal in structure. Mild mitral annular calcifications. Trivial mitral valve regurgitation. 6. The aortic valve is abnormal. Moderate calcifications. Aortic valve regurgitation is not visualized. Mild to moderate aortic valve stenosis. Vmax 2.8 m/s, MG 17 mmHg, AVA 1.8 cm^2, DI 0.46 7. Aortic dilatation noted. There is dilatation of the ascending aorta measuring 42 mm. 8. The inferior vena cava is dilated in size with >50% respiratory variability, suggesting right atrial pressure of 8 mmHg.  Past Medical History:  Diagnosis Date  . Anemia    iron deficiency anemia- infusion on 02/15/2020 and 02/22/2020  . BENIGN PROSTATIC HYPERTROPHY, WITH OBSTRUCTION 05/28/2010  . CAD (coronary artery disease)    a. s/p NSTEMI 06/01/11: DES to RCA;  b. cath 06/25/11:   dLM 50-60% (FFR 0.87), prox to mid LAD 40-50%, D1 50%, pCFX 50%, RCA stent ok, dPDA 80%, EF 55-60%.  His FFR was felt to be negative and therefore medical therapy was recommended ;  echo 6/12: EF 55-60%, mild AS   . Chronic  back pain   . CIDP (chronic inflammatory demyelinating polyneuropathy) ([place]) 04/11/2012  . Colon cancer ([place]) dx'd 2000   "left"  . COLONIC POLYPS, ADENOMATOUS, HX OF 05/28/2010  . Complication of anesthesia    "stopped breathing; related to my sleep apnea" & urinary retention   . COPD (chronic obstructive pulmonary disease) ([place])    "associated w/lung sarcoidosis"  . 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 DISH (diffuse idiopathic skeletal hyperostosis) 12/02/2015  . Diverticulitis   . GENERALIZED OSTEOARTHROSIS UNSPECIFIED SITE 05/28/2010  . GERD 05/28/2010  . Heart murmur   . History of gout   . History of kidney stones   . HYPERLIPIDEMIA 05/28/2010  . Hypertension   . IgM lambda paraproteinemia   . Intrinsic asthma, unspecified  05/28/2010   "associated w/lung sarcoidosis"  . Malignant neoplasm of descending colon ([place]) 05/28/2010  . MITRAL VALVE PROLAPSE 05/28/2010  . 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 NSTEMI (non-ST elevated myocardial infarction) ([place]) 06/03/11  . 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  . OSA on CPAP 05/28/2010  . PALPITATIONS, CHRONIC 05/28/2010  . Parotid gland pain 2010   infection  . Peripheral neuropathy    "tx'd w/targeted chemo" (05/12/2016)  . Pneumonia 2-3 times  . Presence of permanent cardiac pacemaker    [employer]  . 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 Sarcoidosis    pulmonalis  . UNSPECIFIED INFLAMMATORY AND TOXIC NEUROPATHY 05/28/2010  . Vision loss   . VITAMIN D DEFICIENCY 05/28/2010     Past Surgical History:  Procedure Laterality Date  . APPENDECTOMY  1954   age 21  . CATARACT EXTRACTION W/ INTRAOCULAR LENS IMPLANT Left   . COLON SURGERY  2000   decending colon   . CORONARY ANGIOPLASTY WITH STENT PLACEMENT  06/03/11  . CORONARY ARTERY BYPASS GRAFT N/A 06/05/2019   Procedure: CORONARY ARTERY BYPASS GRAFTING (CABG) TIMES THREE USING LEFT INTERNAL MAMMARY ARTERY AND RIGHT GREATER SEPHANOUS VEIN;  Surgeon: [name], MD;  Location: [facility];  Service: Open Heart Surgery;  Laterality: N/A;  . ELECTROPHYSIOLOGIC STUDY N/A 05/12/2016   Procedure: Cardioversion;  Surgeon: [name], MD;  Location: [facility];  Service: Cardiovascular;  Laterality: N/A;  . EP IMPLANTABLE DEVICE N/A 05/12/2016   Procedure: Pacemaker Implant;  Surgeon: [name], MD;  Location: [facility];  Service: Cardiovascular;  Laterality: N/A;  . EYE SURGERY     bilateral cataract with lens implant  . HERNIA REPAIR  01/2019   left inguinal hernia repair  . INGUINAL HERNIA REPAIR Left 02/21/2019   Procedure: LEFT INGUINAL HERNIA REPAIR WITH MESH;  Surgeon: [name], MD;  Location: [facility];  Service: General;  Laterality: Left;  . INSERT / REPLACE /  REMOVE PACEMAKER  05/12/2016  . INTRAVASCULAR PRESSURE WIRE/FFR STUDY N/A 05/15/2019   Procedure: INTRAVASCULAR PRESSURE WIRE/FFR STUDY;  Surgeon: [name], MD;  Location: [facility];  Service: Cardiovascular;  Laterality: N/A;  . KNEE ARTHROSCOPY Right 2003  . LEFT HEART CATH AND CORONARY ANGIOGRAPHY N/A 05/15/2019   Procedure: LEFT HEART CATH AND CORONARY ANGIOGRAPHY;  Surgeon: [name], MD;  Location: [facility];  Service: Cardiovascular;  Laterality: N/A;  . LEFT HEART CATHETERIZATION WITH CORONARY ANGIOGRAM N/A 03/31/2015   Procedure: LEFT HEART CATHETERIZATION WITH CORONARY ANGIOGRAM;  Surgeon: [name], MD;  Location[facility];  Service: Cardiovascular;  Laterality: N/A;  . LUNG SURGERY  2001   "open lung dissection"  . MOHS SURGERY Left ~  2008   ear  . PROSTATE BIOPSY  2010  . TEE WITHOUT CARDIOVERSION N/A 06/05/2019   Procedure: TRANSESOPHAGEAL ECHOCARDIOGRAM (TEE);  Surgeon: [name], [name], MD;  Location: [facility];  Service: Open Heart Surgery;  Laterality: N/A;  . TONSILLECTOMY  1950  . TOTAL KNEE ARTHROPLASTY Right 03/11/2020   Procedure: TOTAL KNEE ARTHROPLASTY;  Surgeon: [name], MD;  Location: [facility];  Service: Orthopedics;  Laterality: Right;  70 mins      Current Outpatient Medications on File Prior to Visit  Medication Sig Dispense Refill  . albuterol (VENTOLIN HFA) 108 (90 Base) MCG/ACT inhaler Inhale 2 puffs into the lungs every 6 (six) hours as needed for wheezing or shortness of breath. 8 g 2  . amLODipine (NORVASC) 5 MG tablet Take 1 tablet (5 mg total) by mouth daily. 90 tablet 3  . bimatoprost (LUMIGAN) 0.01 % SOLN Place 1 drop into both eyes at bedtime.    . Cholecalciferol 25 MCG (1000 UT) tablet Take 1,000 Units by mouth daily.     . dorzolamide-timolol (COSOPT) 22.3-6.8 MG/ML ophthalmic solution Place 1 drop into both eyes 2 (two) times daily.     [name] ELIQUIS 5 MG TABS tablet Take 1 tablet by mouth twice daily 180 tablet 1    . finasteride (PROSCAR) 5 MG tablet Take 5 mg by mouth daily after breakfast.     . fluticasone (FLONASE) 50 MCG/ACT nasal spray Place 2 sprays into both nostrils 2 (two) times daily.    . Fluticasone Propionate, Inhal, (FLOVENT DISKUS) 100 MCG/BLIST AEPB Inhale 1 Inhaler into the lungs 2 (two) times daily. 60 each 2  . MULTAQ 400 MG tablet TAKE 1 TABLET BY MOUTH TWICE DAILY WITH MEALS 180 tablet 3  . Multiple Vitamin (MULTIVITAMIN WITH MINERALS) TABS tablet Take 1 tablet by mouth daily.    . pantoprazole (PROTONIX) 40 MG tablet Take 1 tablet by mouth once daily 90 tablet 0  . Polyethyl Glycol-Propyl Glycol (SYSTANE OP) Place 1 drop into both eyes daily as needed (dry eyes).    . rosuvastatin (CRESTOR) 20 MG tablet Take 1 tablet (20 mg total) by mouth daily. 90 tablet 3  . tadalafil (CIALIS) 5 MG tablet Take 5 mg by mouth at bedtime.     . triamterene-hydrochlorothiazide (MAXZIDE-25) 37.5-25 MG tablet Take 1 tablet by mouth daily. 90 tablet 3   No current facility-administered medications on file prior to visit.   Allergies  Allergen Reactions  . Miralax [Polyethylene Glycol]   . Nsaids     Current Medications, Allergies, Past Medical History, Past Surgical History, Family History and Social History were reviewed in [employer] record.   Review of Systems:   Constitutional: + Fatigue.  Respiratory: No cough or SOB.  Cardiovascular: Negative for chest pain, palpitations and leg swelling.  Gastrointestinal: See HPI.  Musculoskeletal: Negative for back pain or muscle aches.  Neurological: Negative for dizziness or headaches.     Physical Exam: There were no vitals taken for this visit. BP 128/60   Pulse 73   Ht 5\' 9"  (1.753 m)   Wt 186 lb (84.4 kg)   SpO2 98%   BMI 27.47 kg/m  General: Well developed 79 year old male in no acute distress. Head: Normocephalic and atraumatic. Eyes: No scleral icterus. Conjunctiva pink . Ears: Normal auditory  acuity. Mouth: Dentition intact. No ulcers or lesions.  Lungs: Clear throughout to auscultation. Heart: Regular rate and rhythm, 3/6 systolic murmur.  Abdomen: Soft, nontender and nondistended.  No masses or hepatomegaly. Normal bowel sounds x 4 quadrants. Lower abdominal midline and RLQ scars intact.  Rectal: Deferred.  Musculoskeletal: Symmetrical with no gross deformities. Extremities: No edema. Neurological: Alert oriented x 4. No focal deficits.  Psychological: Alert and cooperative. Normal mood and affect  Assessment and Recommendations:  62. 79 year old male with a prior history of colon cancer s/p resection due for a surveillance colonoscopy. Infrequent left mid abdominal pain.  -Colonoscopy benefits and risks discussed including risk with sedation, risk of bleeding, perforation and infection   2. CAD, past MI and RCA stent s/p 3 vessel CABG 05/2019  3. Atrial fibrillation on Eliquis  -Our office will contact Dr. Burt Knack to verify Eliquis instructions prior to colonoscopy   4. Sick sinus syndrome s/p dual chamber pacemaker 04/2016  5. GERD, stable on Pantoprazole 40mg  QD. Normal EGD in 2015.  -Consider EGD at time of his colonoscopy due to [facility] for which he received IV iron 2/202.  Iron level 36 on 06/18/2020. Further recommendations regarding EGD at time of colonoscopy deferred to Dr. Hilarie Fredrickson  -Recommend repeat CBC with iron panel at time of appointment with PCP Dr. [name] tomorrow, patient preferred to have these labs done tomorrow as he anticipates Dr. [name] will order labs then.   5. MGUS/IDA followed by Dr. Lottie Rater

## 2020-10-28 ENCOUNTER — Other Ambulatory Visit: Payer: Self-pay

## 2020-10-28 ENCOUNTER — Encounter: Payer: Self-pay | Admitting: Family Medicine

## 2020-10-28 ENCOUNTER — Ambulatory Visit (INDEPENDENT_AMBULATORY_CARE_PROVIDER_SITE_OTHER): Payer: Medicare Other | Admitting: Family Medicine

## 2020-10-28 VITALS — BP 120/62 | HR 72 | Temp 98.0°F | Ht 68.0 in | Wt 186.2 lb

## 2020-10-28 DIAGNOSIS — I10 Essential (primary) hypertension: Secondary | ICD-10-CM

## 2020-10-28 DIAGNOSIS — D509 Iron deficiency anemia, unspecified: Secondary | ICD-10-CM | POA: Diagnosis not present

## 2020-10-28 DIAGNOSIS — Z1159 Encounter for screening for other viral diseases: Secondary | ICD-10-CM

## 2020-10-28 DIAGNOSIS — I25119 Atherosclerotic heart disease of native coronary artery with unspecified angina pectoris: Secondary | ICD-10-CM | POA: Diagnosis not present

## 2020-10-28 DIAGNOSIS — G6181 Chronic inflammatory demyelinating polyneuritis: Secondary | ICD-10-CM

## 2020-10-28 DIAGNOSIS — D472 Monoclonal gammopathy: Secondary | ICD-10-CM

## 2020-10-28 DIAGNOSIS — E785 Hyperlipidemia, unspecified: Secondary | ICD-10-CM

## 2020-10-28 DIAGNOSIS — G63 Polyneuropathy in diseases classified elsewhere: Secondary | ICD-10-CM | POA: Diagnosis not present

## 2020-10-28 DIAGNOSIS — I480 Paroxysmal atrial fibrillation: Secondary | ICD-10-CM | POA: Diagnosis not present

## 2020-10-28 DIAGNOSIS — M4810 Ankylosing hyperostosis [Forestier], site unspecified: Secondary | ICD-10-CM

## 2020-10-28 LAB — CBC WITH DIFFERENTIAL/PLATELET
Basophils Absolute: 0.1 10*3/uL (ref 0.0–0.1)
Basophils Relative: 1.3 % (ref 0.0–3.0)
Eosinophils Absolute: 0.1 10*3/uL (ref 0.0–0.7)
Eosinophils Relative: 3 % (ref 0.0–5.0)
HCT: 36.6 % — ABNORMAL LOW (ref 39.0–52.0)
Hemoglobin: 11.9 g/dL — ABNORMAL LOW (ref 13.0–17.0)
Lymphocytes Relative: 24 % (ref 12.0–46.0)
Lymphs Abs: 1 10*3/uL (ref 0.7–4.0)
MCHC: 32.6 g/dL (ref 30.0–36.0)
MCV: 82.8 fl (ref 78.0–100.0)
Monocytes Absolute: 0.5 10*3/uL (ref 0.1–1.0)
Monocytes Relative: 12.1 % — ABNORMAL HIGH (ref 3.0–12.0)
Neutro Abs: 2.4 10*3/uL (ref 1.4–7.7)
Neutrophils Relative %: 59.6 % (ref 43.0–77.0)
Platelets: 178 10*3/uL (ref 150.0–400.0)
RBC: 4.41 Mil/uL (ref 4.22–5.81)
RDW: 15.3 % (ref 11.5–15.5)
WBC: 4 10*3/uL (ref 4.0–10.5)

## 2020-10-28 NOTE — Assessment & Plan Note (Signed)
On eliquis anticoagulation

## 2020-10-28 NOTE — Assessment & Plan Note (Signed)
Followed by Dr. Alvy Bimler... due for cbc today prior to upcoming colonoscopy.

## 2020-10-28 NOTE — Assessment & Plan Note (Signed)
due for cbc today prior to upcoming colonoscopy.

## 2020-10-28 NOTE — Patient Instructions (Addendum)
Please stop at the lab to have labs drawn.  Preventive Care 77 Years and Older, Male Preventive care refers to lifestyle choices and visits with your health care provider that can promote health and wellness. This includes:  A yearly physical exam. This is also called an annual well check.  Regular dental and eye exams.  Immunizations.  Screening for certain conditions.  Healthy lifestyle choices, such as diet and exercise. What can I expect for my preventive care visit? Physical exam Your health care provider will check:  Height and weight. These may be used to calculate body mass index (BMI), which is a measurement that tells if you are at a healthy weight.  Heart rate and blood pressure.  Your skin for abnormal spots. Counseling Your health care provider may ask you questions about:  Alcohol, tobacco, and drug use.  Emotional well-being.  Home and relationship well-being.  Sexual activity.  Eating habits.  History of falls.  Memory and ability to understand (cognition).  Work and work environment. What immunizations do I need?  Influenza (flu) vaccine  This is recommended every year. Tetanus, diphtheria, and pertussis (Tdap) vaccine  You may need a Td booster every 10 years. Varicella (chickenpox) vaccine  You may need this vaccine if you have not already been vaccinated. Zoster (shingles) vaccine  You may need this after age 60. Pneumococcal conjugate (PCV13) vaccine  One dose is recommended after age 65. Pneumococcal polysaccharide (PPSV23) vaccine  One dose is recommended after age 65. Measles, mumps, and rubella (MMR) vaccine  You may need at least one dose of MMR if you were born in 1957 or later. You may also need a second dose. Meningococcal conjugate (MenACWY) vaccine  You may need this if you have certain conditions. Hepatitis A vaccine  You may need this if you have certain conditions or if you travel or work in places where you may  be exposed to hepatitis A. Hepatitis B vaccine  You may need this if you have certain conditions or if you travel or work in places where you may be exposed to hepatitis B. Haemophilus influenzae type b (Hib) vaccine  You may need this if you have certain conditions. You may receive vaccines as individual doses or as more than one vaccine together in one shot (combination vaccines). Talk with your health care provider about the risks and benefits of combination vaccines. What tests do I need? Blood tests  Lipid and cholesterol levels. These may be checked every 5 years, or more frequently depending on your overall health.  Hepatitis C test.  Hepatitis B test. Screening  Lung cancer screening. You may have this screening every year starting at age 55 if you have a 30-pack-year history of smoking and currently smoke or have quit within the past 15 years.  Colorectal cancer screening. All adults should have this screening starting at age 50 and continuing until age 75. Your health care provider may recommend screening at age 45 if you are at increased risk. You will have tests every 1-10 years, depending on your results and the type of screening test.  Prostate cancer screening. Recommendations will vary depending on your family history and other risks.  Diabetes screening. This is done by checking your blood sugar (glucose) after you have not eaten for a while (fasting). You may have this done every 1-3 years.  Abdominal aortic aneurysm (AAA) screening. You may need this if you are a current or former smoker.  Sexually transmitted disease (STD) testing.   Follow these instructions at home: Eating and drinking  Eat a diet that includes fresh fruits and vegetables, whole grains, lean protein, and low-fat dairy products. Limit your intake of foods with high amounts of sugar, saturated fats, and salt.  Take vitamin and mineral supplements as recommended by your health care provider.  Do  not drink alcohol if your health care provider tells you not to drink.  If you drink alcohol: ? Limit how much you have to 0-2 drinks a day. ? Be aware of how much alcohol is in your drink. In the [place] one drink equals one 12 oz bottle of beer (355 mL), one 5 oz glass of wine (148 mL), or one 1 oz glass of hard liquor (44 mL). Lifestyle  Take daily care of your teeth and gums.  Stay active. Exercise for at least 30 minutes on 5 or more days each week.  Do not use any products that contain nicotine or tobacco, such as cigarettes, e-cigarettes, and chewing tobacco. If you need help quitting, ask your health care provider.  If you are sexually active, practice safe sex. Use a condom or other form of protection to prevent STIs (sexually transmitted infections).  Talk with your health care provider about taking a low-dose aspirin or statin. What's next?  Visit your health care provider once a year for a well check visit.  Ask your health care provider how often you should have your eyes and teeth checked.  Stay up to date on all vaccines. This information is not intended to replace advice given to you by your health care provider. Make sure you discuss any questions you have with your health care provider. Document Revised: 12/07/2018 Document Reviewed: 12/07/2018 Elsevier Patient Education  2020 [employer] Inc.   

## 2020-10-28 NOTE — Assessment & Plan Note (Signed)
Off asa per cards given bleeding risk.  No current angina.

## 2020-10-28 NOTE — Progress Notes (Signed)
Chief Complaint  Patient presents with  . Annual Exam    Part 2    History of Present Illness: HPI  This visit occurred during the SARS-CoV-2 public health emergency.  Safety protocols were in place, including screening questions prior to the visit, additional usage of staff PPE, and extensive cleaning of exam room while observing appropriate contact time as indicated for disinfecting solutions.    The patient presents for  complete physical and review of chronic health problems. He/She also has the following acute concerns today:  Health Maintenance: No gaps noted Abnormal Screenings: none  Hypertension:   At goal on current regimen.  BP Readings from Last 3 Encounters:  10/28/20 120/62  10/27/20 128/60  09/23/20 124/66  Using medication without problems or lightheadedness:  none Chest pain with exertion:none Edema:none Short of breath:none Average home BPs: Other issues:  CAD,Paroxsysmal Afib/ heart block: Followed by Dr. 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 Minimal risk of multiple myeloma.. released from  Oncologist.  Neuropathy followed by neurology  DISH: diffuse.. Pain in right knee, hands, back, ankles Followed by Rheum (Dr. Marijean Bravo). Is considering PT Repeat. Exercising daily.  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 Minimal symptoms, no longer on pulmicort. Rarely using  albuterol. Only issue is when he gets lung infection.. Needs inhaler.  OSA on CPAP.  Hx of colon cancer 2000  COVID 19 screen:  No recent travel or known exposure to [place] The patient denies respiratory symptoms of COVID 19 at this time. The importance of social distancing was discussed today.     ROS    Past Medical History:  Diagnosis Date  . Anemia    iron deficiency anemia- infusion on 02/15/2020 and 02/22/2020  . BENIGN PROSTATIC HYPERTROPHY, WITH OBSTRUCTION 05/28/2010  . CAD (coronary artery disease)    a. s/p NSTEMI 06/01/11: DES to RCA;  b. cath 06/25/11:   dLM 50-60% (FFR 0.87), prox to mid LAD 40-50%, D1 50%, pCFX 50%, RCA stent ok, dPDA 80%, EF 55-60%.  His FFR was felt to be negative and therefore medical therapy was recommended ;  echo 6/12: EF 55-60%, mild AS   . Chronic back pain   . 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 Colon cancer ([place]) dx'd 2000   "left"  . COLONIC POLYPS, ADENOMATOUS, HX OF 05/28/2010  . Complication of anesthesia    "stopped breathing; related to my sleep apnea" & urinary retention   . COPD (chronic obstructive pulmonary disease) ([place])    "associated w/lung sarcoidosis"  . 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 DISH (diffuse idiopathic skeletal hyperostosis) 12/02/2015  . Diverticulitis   . GENERALIZED OSTEOARTHROSIS UNSPECIFIED SITE 05/28/2010  . GERD 05/28/2010  . Heart murmur   . History of gout   . History of kidney stones   . HYPERLIPIDEMIA 05/28/2010  . Hypertension   . IgM  lambda paraproteinemia   . 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 Malignant neoplasm of descending colon ([place]) 05/28/2010  . MITRAL VALVE PROLAPSE 05/28/2010  . 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 NSTEMI (non-ST elevated myocardial infarction) ([place]) 06/03/11  . 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  . OSA on CPAP 05/28/2010  . PALPITATIONS, CHRONIC 05/28/2010  .  Parotid gland pain 2010   infection  . Peripheral neuropathy    "tx'd w/targeted chemo" (05/12/2016)  . Pneumonia 2-3 times  . Presence of permanent cardiac pacemaker    [employer]  . 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 Sarcoidosis    pulmonalis  . UNSPECIFIED INFLAMMATORY AND TOXIC NEUROPATHY 05/28/2010  . Vision loss   . VITAMIN D DEFICIENCY 05/28/2010    reports that he has never smoked. He has never used smokeless tobacco. He reports current alcohol use. He reports that he does not use drugs.   Current Outpatient Medications:  .  albuterol (VENTOLIN HFA) 108 (90 Base) MCG/ACT inhaler, Inhale 2 puffs into the lungs every 6 (six) hours as needed for wheezing or shortness of breath., Disp: 8 g, Rfl: 2 .  amLODipine (NORVASC) 5 MG tablet, Take 1 tablet (5 mg total) by mouth daily., Disp: 90 tablet, Rfl: 3 .  bimatoprost (LUMIGAN) 0.01 % SOLN, Place 1 drop into both eyes at bedtime., Disp: , Rfl:  .  Cholecalciferol 25 MCG (1000 UT) tablet, Take 1,000 Units by mouth daily. , Disp: , Rfl:  .  dorzolamide-timolol (COSOPT) 22.3-6.8 MG/ML ophthalmic solution, Place 1 drop into both eyes 2 (two) times daily. , Disp: , Rfl:  .  ELIQUIS 5 MG TABS tablet, Take 1 tablet by mouth twice daily, Disp: 180 tablet, Rfl: 1 .  finasteride (PROSCAR) 5 MG tablet, Take 5 mg by mouth daily after breakfast. , Disp: , Rfl:  .  fluticasone (FLONASE) 50 MCG/ACT nasal spray, Place 2 sprays into both nostrils 2 (two) times daily., Disp: , Rfl:  .  Fluticasone Propionate, Inhal, (FLOVENT DISKUS) 100 MCG/BLIST AEPB, Inhale 1 Inhaler into the lungs 2 (two) times daily., Disp: 60 each, Rfl: 2 .  MULTAQ 400 MG tablet, TAKE 1 TABLET BY MOUTH TWICE DAILY WITH MEALS, Disp: 180 tablet, Rfl: 3 .  Multiple Vitamin (MULTIVITAMIN WITH MINERALS) TABS tablet, Take 1 tablet by mouth daily., Disp: , Rfl:  .  pantoprazole (PROTONIX) 40 MG tablet, Take 1 tablet by mouth once daily, Disp: 90 tablet, Rfl: 0 .  Polyethyl Glycol-Propyl Glycol  (SYSTANE OP), Place 1 drop into both eyes daily as needed (dry eyes)., Disp: , Rfl:  .  rosuvastatin (CRESTOR) 20 MG tablet, Take 1 tablet (20 mg total) by mouth daily., Disp: 90 tablet, Rfl: 3 .  Sodium Sulfate-Mag Sulfate-KCl (SUTAB) [phone] MG TABS, Take 1 kit by mouth as directed. MANUFACTURER CODES!! BIN: [id number] PCN: CN GROUP: [id number] MEMBER ID: [id number];OZY AS CASH;NO PRIOR AUTHORIZATION, Disp: 24 tablet, Rfl: 0 .  tadalafil (CIALIS) 5 MG tablet, Take 5 mg by mouth at bedtime. , Disp: , Rfl:  .  triamterene-hydrochlorothiazide (MAXZIDE-25) 37.5-25 MG tablet, Take 1 tablet by mouth daily., Disp: 90 tablet, Rfl: 3   Observations/Objective: Blood pressure 120/62, pulse 72, temperature 98 F (36.7 C), temperature source Temporal, height 5' 8" (1.727 m), weight 186 lb 4 oz (84.5 kg), SpO2 98 %.  Physical Exam Constitutional:      General: He is not in acute distress.    Appearance: Normal appearance. He is well-developed. He  is not ill-appearing or toxic-appearing.  HENT:     Head: Normocephalic and atraumatic.     Right Ear: Hearing, tympanic membrane, ear canal and external ear normal.     Left Ear: Hearing, tympanic membrane, ear canal and external ear normal.     Nose: Nose normal.     Mouth/Throat:     Pharynx: Uvula midline.  Eyes:     General: Lids are normal. Lids are everted, no foreign bodies appreciated.     Conjunctiva/sclera: Conjunctivae normal.     Pupils: Pupils are equal, round, and reactive to light.  Neck:     Thyroid: No thyroid mass or thyromegaly.     Vascular: No carotid bruit.     Trachea: Trachea and phonation normal.  Cardiovascular:     Rate and Rhythm: Normal rate and regular rhythm.     Pulses: Normal pulses.     Heart sounds: S1 normal and S2 normal. Heart sounds are distant. Murmur heard.  Systolic murmur is present.  No gallop.   Pulmonary:     Breath sounds: Normal breath sounds. No wheezing, rhonchi or rales.  Abdominal:     General:  Bowel sounds are normal.     Palpations: Abdomen is soft.     Tenderness: There is no abdominal tenderness. There is no guarding or rebound.     Hernia: No hernia is present.  Musculoskeletal:     Cervical back: Normal range of motion and neck supple.     Comments: Stiffness and deformity in bialteral knees  Lymphadenopathy:     Cervical: No cervical adenopathy.  Skin:    General: Skin is warm and dry.     Findings: No rash.  Neurological:     Mental Status: He is alert.     Cranial Nerves: No cranial nerve deficit.     Sensory: No sensory deficit.     Gait: Gait normal.     Deep Tendon Reflexes: Reflexes are normal and symmetric.  Psychiatric:        Speech: Speech normal.        Behavior: Behavior normal.        Judgment: Judgment normal.      Assessment and Plan   The patient's preventative maintenance and recommended screening tests for an annual wellness exam were reviewed in full today. Brought up to date unless services declined.  Counselled on the importance of diet, exercise, and its role in overall health and mortality. The patient's FH and SH was reviewed, including their home life, tobacco status, and drug and alcohol status.         Smoking Status:nonsmoker ETOH/ drug XTK:[id number]/ none  Vaccines:TDap, PNA uptodate, S/P COVID x 3, flu, Consider PNA repeat given sarcoid Prostate Cancer Screen:BPH, hx of biopsy. PSA Followed by Dr. Alinda Money Colon Cancer Screen: Dr. pyrtleHx of colon cancer 10/2017 colonoscopy on 3 year recall.. schedule Dec 3 Hep C: pending  Hyperlipidemia with target LDL less than 100 Well controlled. Continue current medication. Encouraged exercise, weight loss, healthy eating habits.   MGUS (monoclonal gammopathy of unknown significance)  Followed by Dr. Alvy Bimler... due for cbc today prior to upcoming colonoscopy.  CIDP (chronic inflammatory demyelinating polyneuropathy) Stable on gabapentin followed by neurology.  Coronary  artery disease involving native coronary artery of native heart with angina pectoris ([place])  Off asa per cards given bleeding risk.  No current angina.  Paroxysmal atrial fibrillation ([name]) On eliquis anticoagulation  Iron deficiency anemia due for cbc today prior to upcoming  colonoscopy.     [name], MD

## 2020-10-28 NOTE — Telephone Encounter (Signed)
   Primary Cardiologist: [name], MD  Chart reviewed as part of pre-operative protocol coverage. Patient was contacted 10/28/2020 in reference to pre-operative risk assessment for pending surgery as outlined below.  Derrick [name] was last seen on 09/23/2020 by Dr. Curt Bears.  Since that day, Derrick [name] has done well.  Therefore, based on ACC/AHA guidelines, the patient would be at acceptable risk for the planned procedure without further cardiovascular testing.   The patient was advised that if he develops new symptoms prior to surgery to contact our office to arrange for a follow-up visit, and he verbalized understanding.  I will route this recommendation to the requesting party via Epic fax function and remove from pre-op pool. Please call with questions.  Per our clinical pharmacist, he may hold Eliquis for 2 days prior to the procedure and restart as soon as possible afterward.   [place], [place] 10/28/2020, 6:02 PM

## 2020-10-28 NOTE — Assessment & Plan Note (Signed)
Well controlled. Continue current medication. Encouraged exercise, weight loss, healthy eating habits.  

## 2020-10-28 NOTE — Progress Notes (Signed)
Given planned colonoscopy and iron deficiency earlier this year I would recommend EGD at same time as colonoscopy This can be arranged if patient agreeable

## 2020-10-28 NOTE — Assessment & Plan Note (Signed)
Stable on gabapentin followed by neurology.

## 2020-10-29 ENCOUNTER — Telehealth: Payer: Self-pay

## 2020-10-29 LAB — HEPATITIS C ANTIBODY
Hepatitis C Ab: NONREACTIVE
SIGNAL TO CUT-OFF: 0.01 (ref <1.00)

## 2020-10-29 NOTE — Telephone Encounter (Signed)
Confirmed with patient he has been contacted by Cardiology and they have instructed him on holding his Eliquis

## 2020-10-29 NOTE — Progress Notes (Signed)
I contacted the patient and discussed due to his IDA which required IV iron prior to his knee surgery and prior history of GERD an EGD to be done at the time of his colonoscopy was recommended by Dr. Hilarie Fredrickson . He is currently scheduled for a colonoscopy on 11/28/2020. The patient provided his consent to proceed with an EGD and colonoscopy. He stated he was advised by his cardiologist Dr. Burt Knack to hold his Eliquis for 2 days prior to his procedures.   Beth, pls add EGD to his colonoscopy orders. Thank you.

## 2020-10-29 NOTE — Telephone Encounter (Addendum)
-----   Message from Noralyn Pick, NP sent at 10/29/2020  8:17 AM EDT ----- I contacted the patient and discussed due to his IDA which required IV iron prior to his knee surgery and prior history of GERD an EGD to be done at the time of his colonoscopy was recommended by Dr. Hilarie Fredrickson . He is currently scheduled for a colonoscopy on 11/28/2020. The patient provided his consent to proceed with an EGD and colonoscopy. He stated he was advised by his cardiologist Dr. Burt Knack to hold his Eliquis for 2 days prior to his procedures.   Beth, pls add EGD to his colonoscopy orders. Thank you.

## 2020-11-03 DIAGNOSIS — N401 Enlarged prostate with lower urinary tract symptoms: Secondary | ICD-10-CM | POA: Diagnosis not present

## 2020-11-04 DIAGNOSIS — D225 Melanocytic nevi of trunk: Secondary | ICD-10-CM | POA: Diagnosis not present

## 2020-11-04 DIAGNOSIS — D2239 Melanocytic nevi of other parts of face: Secondary | ICD-10-CM | POA: Diagnosis not present

## 2020-11-04 DIAGNOSIS — D2339 Other benign neoplasm of skin of other parts of face: Secondary | ICD-10-CM | POA: Diagnosis not present

## 2020-11-04 DIAGNOSIS — L814 Other melanin hyperpigmentation: Secondary | ICD-10-CM | POA: Diagnosis not present

## 2020-11-04 DIAGNOSIS — D485 Neoplasm of uncertain behavior of skin: Secondary | ICD-10-CM | POA: Diagnosis not present

## 2020-11-04 DIAGNOSIS — L578 Other skin changes due to chronic exposure to nonionizing radiation: Secondary | ICD-10-CM | POA: Diagnosis not present

## 2020-11-04 DIAGNOSIS — Z85828 Personal history of other malignant neoplasm of skin: Secondary | ICD-10-CM | POA: Diagnosis not present

## 2020-11-04 DIAGNOSIS — L821 Other seborrheic keratosis: Secondary | ICD-10-CM | POA: Diagnosis not present

## 2020-11-04 DIAGNOSIS — D239 Other benign neoplasm of skin, unspecified: Secondary | ICD-10-CM | POA: Diagnosis not present

## 2020-11-09 NOTE — Progress Notes (Signed)
PATIENT: Derrick [name] DOB: [birth year]  REASON FOR VISIT: follow up HISTORY FROM: patient  HISTORY OF PRESENT ILLNESS: Today 11/09/20:  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  His download indicates that he uses his machine nightly for compliance of 100%.  He uses machine greater than 4 hours each night.  On average he uses his machine 10 hours and 29 minutes.  Residual AHI is 2.6 on 5 to 15 cm of water with EPR 3.  Leak in the 95th percentile is 11.3.  He reports that he has burning and tingling in the legs pretty consistently.  He states that he was on gabapentin in the past but did not like the cognitive side effects.  He would rather not be on oral medication.  He states if he stands for a long period of time the numbness worsens and he feels like he may lose his balance.  He has not had any falls.  He does have a cane that he uses if needed.  He had a total knee replacement in March on the right.  He states that it has been a very painful experience and still experiences discomfort.  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  He had been for at least 12 years CPAP treatment at a pressure of 16 cm water,  he was also using a full face mask at the time , which was comfortable.  His original diagnostic sleep study took place in 2000. The patient endorsed  Epworth sleepiness score at the of 24 points in the FSS at an elevated rate of 42 points as well. His BMI at the time was 30. Meanwhile he had lost 65 pounds.   [name] underwent a sleep study on 11-30-2011. His AHI at time of study was 42.1 and his RDI 45.5 his REM AHI was 30 and non-REM 42.9 he did not have any  supine sleep. He had oxygen desaturations to a nadir of 82% and had a total desaturation time of 52.6 minutes with a the first 3 hours of sleep.The results were indicative of severe sleep apnea and  the patient was titrated to CPAP on 12-05-2011 at the pressure of only 8 cm. He had REM rebound at this setting ,the residual AHI in the night was 0.3 per hour. He also recommended the use of a nasal pillow of possible. The CPAP was download it in 2013 after 90 days of CPAP he was with a new pressure. He had 100% compliance rate and residual AHI of 3.4 with only moderate to mild air leak that pressure was 8 cm water. There are peak time per day was 8 hours and 48 minutes. CPAP still set at 8 cm water with  3 cm EPR.  The air leak has slightly increased the residual AHI is 2.2,  even better than last year. The  therapeutic time is 8 hour and 53 minutes.  The patient has 100% compliance. Today he endorsed Epworth sleepiness score at 0 point and the fatigue severity score at only 15 points.  Geriatric depression score was endorsed at he only scored 1 point not suggestive of clinical depression.  Interval history ;[name] has  also a monoclonal gammopathy and has been treated with rituximab the medication had continuously improved his exercise tolerance, finally he was able to walk 30-40 minutes a day. He has been exercising daily.  He did develop not neuropathic pain,  but some and knee pain on the right and an He has comMRI was now showing DDD on the correspondin spinal level.  Completed his rituximab treatment with Dr. [name]. in February 2014 .He underwent a total of 3 steroid injections at the L5-S1 distribution which had meanwhile shown a benefit. He has been on gabapentin for neuropathic pain, seems to be related to his MGUS diagnosis .He had developed a severe cold intolerance progressively actually the last year. He has noticed since March 2014  and since his Retuximab treatment was completed, that he  has some cold cramping as well and toe cramps,  pain and numbness.  Last Time I ordered NCS and EMG.   Dr Jannifer Franklin did a EMG and NCS, and found axonal damage , not a finding of CIDP.  This was after 3 years of MGUS treatment and I answered his questions, "did I ever have CIDP'? I believe ,yes.  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  The patient has hp pain but is ambulatory, he periodically loses control of his foot lifting muscles - stretching exercises help. As to his obstructive sleep apnea, there is no need for any adjustment at this time. His current settings are comfortable and has residual AHI is very low. In office download confirmed high compliance 02-13-14 . He is extremely intolerant to cold temperature, and has anhydrosis - this is more often seen in dysautonomia with NP.  He was identified as axonal neuropathy by Dr. Jannifer Franklin. I would like to follow the patient once a year for the CPAP exclusively for sleep - RV in 12 month . The CPAP  compliance report for 10-16-14 reveals a AHI of  1.2 and daily user time of  9 hours and 14 minutes, and 100% over 4 hour compliance over 4 hours of nightly use.  He has spinal stenosis - 4 times daily 400 mg neurontin.  A revisit will be scheduled in 12 months. Labs , CBC and Diff by oncologist. Cold agglutinin ordered. Plasma -Electrophoresis reordered.    REVIEW OF SYSTEMS: Out of a complete 14 system review of symptoms, the patient complains only of the following symptoms, and all other reviewed systems are negative.  FSS ESS  ALLERGIES: Allergies  Allergen Reactions  . Miralax [Polyethylene Glycol]   . Nsaids     HOME MEDICATIONS: Outpatient Medications Prior to Visit  Medication Sig Dispense Refill  . albuterol (VENTOLIN HFA) 108 (90 Base) MCG/ACT inhaler Inhale 2 puffs into the lungs every 6 (six) hours as needed  for wheezing or shortness of breath. 8 g 2  . amLODipine (NORVASC) 5 MG tablet Take 1 tablet (5 mg total) by mouth daily. 90 tablet 3  . bimatoprost (LUMIGAN) 0.01 % SOLN Place 1 drop into both eyes at bedtime.    . Cholecalciferol 25 MCG (1000 UT) tablet Take 1,000 Units by mouth daily.     . dorzolamide-timolol (COSOPT) 22.3-6.8 MG/ML ophthalmic solution Place 1 drop into both eyes 2 (two) times daily.     [name] ELIQUIS 5 MG TABS tablet Take 1 tablet by mouth twice daily 180 tablet 1  . finasteride (PROSCAR) 5 MG  tablet Take 5 mg by mouth daily after breakfast.     . fluticasone (FLONASE) 50 MCG/ACT nasal spray Place 2 sprays into both nostrils 2 (two) times daily.    . Fluticasone Propionate, Inhal, (FLOVENT DISKUS) 100 MCG/BLIST AEPB Inhale 1 Inhaler into the lungs 2 (two) times daily. 60 each 2  . MULTAQ 400 MG tablet TAKE 1 TABLET BY MOUTH TWICE DAILY WITH MEALS 180 tablet 3  . Multiple Vitamin (MULTIVITAMIN WITH MINERALS) TABS tablet Take 1 tablet by mouth daily.    . pantoprazole (PROTONIX) 40 MG tablet Take 1 tablet by mouth once daily 90 tablet 0  . Polyethyl Glycol-Propyl Glycol (SYSTANE OP) Place 1 drop into both eyes daily as needed (dry eyes).    . rosuvastatin (CRESTOR) 20 MG tablet Take 1 tablet (20 mg total) by mouth daily. 90 tablet 3  . Sodium Sulfate-Mag Sulfate-KCl (SUTAB) [phone] MG TABS Take 1 kit by mouth as directed. MANUFACTURER CODES!! BIN: [id number] PCN: CN GROUP: [id number] MEMBER ID: [id number];MBW AS CASH;NO PRIOR AUTHORIZATION 24 tablet 0  . tadalafil (CIALIS) 5 MG tablet Take 5 mg by mouth at bedtime.     . triamterene-hydrochlorothiazide (MAXZIDE-25) 37.5-25 MG tablet Take 1 tablet by mouth daily. 90 tablet 3   No facility-administered medications prior to visit.    PAST MEDICAL HISTORY: Past Medical History:  Diagnosis Date  . Anemia    iron deficiency anemia- infusion on 02/15/2020 and 02/22/2020  . BENIGN PROSTATIC HYPERTROPHY, WITH OBSTRUCTION 05/28/2010    . CAD (coronary artery disease)    a. s/p NSTEMI 06/01/11: DES to RCA;  b. cath 06/25/11:   dLM 50-60% (FFR 0.87), prox to mid LAD 40-50%, D1 50%, pCFX 50%, RCA stent ok, dPDA 80%, EF 55-60%.  His FFR was felt to be negative and therefore medical therapy was recommended ;  echo 6/12: EF 55-60%, mild AS   . Chronic back pain   . CIDP (chronic inflammatory demyelinating polyneuropathy) ([place]) 04/11/2012  . Colon cancer ([place]) dx'd 2000   "left"  . COLONIC POLYPS, ADENOMATOUS, HX OF 05/28/2010  . Complication of anesthesia    "stopped breathing; related to my sleep apnea" & urinary retention   . COPD (chronic obstructive pulmonary disease) ([place])    "associated w/lung sarcoidosis"  . 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 DISH (diffuse idiopathic skeletal hyperostosis) 12/02/2015  . Diverticulitis   . GENERALIZED OSTEOARTHROSIS UNSPECIFIED SITE 05/28/2010  . GERD 05/28/2010  . Heart murmur   . History of gout   . History of kidney stones   . HYPERLIPIDEMIA 05/28/2010  . Hypertension   . IgM lambda paraproteinemia   . 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 Malignant neoplasm of descending colon ([place]) 05/28/2010  . MITRAL VALVE PROLAPSE 05/28/2010  . 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 NSTEMI (non-ST elevated myocardial infarction) ([place]) 06/03/11  . 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  . OSA on CPAP 05/28/2010  . PALPITATIONS, CHRONIC 05/28/2010  . Parotid gland pain 2010   infection  . Peripheral neuropathy    "tx'd w/targeted chemo" (05/12/2016)  . Pneumonia 2-3 times  . Presence of permanent cardiac pacemaker    [employer]tronic  . 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 Sarcoidosis    pulmonalis  . UNSPECIFIED INFLAMMATORY AND TOXIC NEUROPATHY 05/28/2010  . Vision loss   . VITAMIN D DEFICIENCY 05/28/2010    PAST SURGICAL HISTORY: Past Surgical History:  Procedure Laterality Date  . APPENDECTOMY  1954   age 72  .  CATARACT EXTRACTION W/  INTRAOCULAR LENS IMPLANT Left   . COLON SURGERY  2000   decending colon   . CORONARY ANGIOPLASTY WITH STENT PLACEMENT  06/03/11  . CORONARY ARTERY BYPASS GRAFT N/A 06/05/2019   Procedure: CORONARY ARTERY BYPASS GRAFTING (CABG) TIMES THREE USING LEFT INTERNAL MAMMARY ARTERY AND RIGHT GREATER SEPHANOUS VEIN;  Surgeon: [name], MD;  Location: [facility];  Service: Open Heart Surgery;  Laterality: N/A;  . ELECTROPHYSIOLOGIC STUDY N/A 05/12/2016   Procedure: Cardioversion;  Surgeon: [name], MD;  Location: [facility];  Service: Cardiovascular;  Laterality: N/A;  . EP IMPLANTABLE DEVICE N/A 05/12/2016   Procedure: Pacemaker Implant;  Surgeon: [name], MD;  Location: [facility];  Service: Cardiovascular;  Laterality: N/A;  . EYE SURGERY     bilateral cataract with lens implant  . HERNIA REPAIR  01/2019   left inguinal hernia repair  . INGUINAL HERNIA REPAIR Left 02/21/2019   Procedure: LEFT INGUINAL HERNIA REPAIR WITH MESH;  Surgeon: [name], MD;  Location: [facility];  Service: General;  Laterality: Left;  . INSERT / REPLACE / REMOVE PACEMAKER  05/12/2016  . INTRAVASCULAR PRESSURE WIRE/FFR STUDY N/A 05/15/2019   Procedure: INTRAVASCULAR PRESSURE WIRE/FFR STUDY;  Surgeon: [name], MD;  Location: [facility];  Service: Cardiovascular;  Laterality: N/A;  . KNEE ARTHROSCOPY Right 2003  . LEFT HEART CATH AND CORONARY ANGIOGRAPHY N/A 05/15/2019   Procedure: LEFT HEART CATH AND CORONARY ANGIOGRAPHY;  Surgeon: [name], MD;  Location: [facility];  Service: Cardiovascular;  Laterality: N/A;  . LEFT HEART CATHETERIZATION WITH CORONARY ANGIOGRAM N/A 03/31/2015   Procedure: LEFT HEART CATHETERIZATION WITH CORONARY ANGIOGRAM;  Surgeon: [name], MD;  Location: [facility];  Service: Cardiovascular;  Laterality: N/A;  . LUNG SURGERY  2001   "open lung dissection"  . MOHS SURGERY Left ~ 2008   ear  . PROSTATE BIOPSY  2010  . TEE WITHOUT  CARDIOVERSION N/A 06/05/2019   Procedure: TRANSESOPHAGEAL ECHOCARDIOGRAM (TEE);  Surgeon: [name], [name], MD;  Location: [facility];  Service: Open Heart Surgery;  Laterality: N/A;  . TONSILLECTOMY  1950  . TOTAL KNEE ARTHROPLASTY Right 03/11/2020   Procedure: TOTAL KNEE ARTHROPLASTY;  Surgeon: [name], MD;  Location: [facility];  Service: Orthopedics;  Laterality: Right;  70 mins    FAMILY HISTORY: Family History  Problem Relation Age of Onset  . Arthritis Mother        severe  . Coronary artery disease Mother   . Colon polyps Mother   . Heart disease Mother   . Coronary artery disease Brother   . Cancer Brother        choriocarcinoma  . Arrhythmia Brother 34       Afib/Tachycardia  . Heart attack Brother   . Congestive Heart Failure Brother     SOCIAL HISTORY: Social History   Socioeconomic History  . Marital status: Married    Spouse name: Not on file  . Number of children: 2  . Years of education: MS-Coll.  . Highest education level: Not on file  Occupational History  . Occupation: Optometrist    Comment: Building services engineer  Tobacco Use  . Smoking status: Never Smoker  . Smokeless tobacco: Never Used  Vaping Use  . Vaping Use: Never used  Substance and Sexual Activity  . Alcohol use: Yes    Comment: occasional  . Drug use: No  . Sexual activity: Yes  Birth control/protection: None  Other Topics Concern  . Not on file  Social History Narrative   College- [employer]; [employer]-environment science and mgt. Married [name])  "31" 1 son- 81; 1 dtr "23" 2 grandchildren. 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 End of life-provided discussive context and provided packet.   Social Determinants of Health   Financial Resource Strain: Low Risk   . Difficulty of Paying Living Expenses: Not hard at all  Food Insecurity: No Food Insecurity  . Worried About Charity fundraiser in the Last Year: Never true  . Ran Out of Food in the Last Year: Never true    Transportation Needs: No Transportation Needs  . Lack of Transportation (Medical): No  . Lack of Transportation (Non-Medical): No  Physical Activity: Sufficiently Active  . Days of Exercise per Week: 7 days  . Minutes of Exercise per Session: 60 min  Stress: No Stress Concern Present  . Feeling of Stress : Not at all  Social Connections:   . Frequency of Communication with Friends and Family: Not on file  . Frequency of Social Gatherings with Friends and Family: Not on file  . Attends Religious Services: Not on file  . Active Member of Clubs or Organizations: Not on file  . Attends Archivist Meetings: Not on file  . Marital Status: Not on file  Intimate Partner Violence: Not At Risk  . Fear of Current or Ex-Partner: No  . Emotionally Abused: No  . Physically Abused: No  . Sexually Abused: No      PHYSICAL EXAM  There were no vitals filed for this visit. There is no height or weight on file to calculate BMI.  Generalized: Well developed, in no acute distress  Chest: Lungs clear to auscultation bilaterally  Neurological examination  Mentation: Alert oriented to time, place, history taking. Follows all commands speech and language fluent Cranial nerve II-XII: Extraocular movements were full, visual field were full on confrontational test Head turning and shoulder shrug  were normal and symmetric. Motor: The motor testing reveals 5 over 5 strength of all 4 extremities. Good symmetric motor tone is noted throughout.  Sensory: Sensory testing is intact to soft touch on all 4 extremities. No evidence of extinction is noted.  Gait and station: Gait is normal.    DIAGNOSTIC DATA (LABS, IMAGING, TESTING) - I reviewed patient records, labs, notes, testing and imaging myself where available.  Lab Results  Component Value Date   WBC 4.0 10/28/2020   HGB 11.9 (L) 10/28/2020   HCT 36.6 (L) 10/28/2020   MCV 82.8 10/28/2020   PLT 178.0 10/28/2020      Component Value  Date/Time   NA 137 10/22/2020 0823   NA 141 05/29/2018 0806   NA 141 07/27/2016 0748   K 4.0 10/22/2020 0823   K 3.9 07/27/2016 0748   CL 101 10/22/2020 0823   CL 104 03/23/2013 0855   CO2 29 10/22/2020 0823   CO2 30 (H) 07/27/2016 0748   GLUCOSE 93 10/22/2020 0823   GLUCOSE 122 07/27/2016 0748   GLUCOSE 98 03/23/2013 0855   BUN 15 10/22/2020 0823   BUN 14 05/29/2018 0806   BUN 16.0 07/27/2016 0748   CREATININE 1.01 10/22/2020 0823   CREATININE 1.03 02/01/2020 1520   CREATININE 1.0 07/27/2016 0748   CALCIUM 10.3 10/22/2020 0823   CALCIUM 10.5 (H) 07/27/2016 0748   PROT 6.8 10/22/2020 0823   PROT 6.6 05/21/2020 0721   PROT 7.5 07/27/2016 0748  ALBUMIN 4.3 10/22/2020 0823   ALBUMIN 4.3 05/21/2020 0721   ALBUMIN 4.3 07/27/2016 0748   AST 19 10/22/2020 0823   AST 24 07/27/2016 0748   ALT 12 10/22/2020 0823   ALT 19 07/27/2016 0748   ALKPHOS 49 10/22/2020 0823   ALKPHOS 53 07/27/2016 0748   BILITOT 0.9 10/22/2020 0823   BILITOT 0.4 05/21/2020 0721   BILITOT 0.84 07/27/2016 0748   GFRNONAA >60 04/14/2020 1221   GFRAA >60 04/14/2020 1221   Lab Results  Component Value Date   CHOL 152 10/22/2020   HDL 71.90 10/22/2020   LDLCALC 70 10/22/2020   TRIG 47.0 10/22/2020   CHOLHDL 2 10/22/2020   Lab Results  Component Value Date   HGBA1C 5.0 02/01/2020   Lab Results  Component Value Date   XFQHKUVJ50 518 06/18/2020   Lab Results  Component Value Date   TSH 2.06 06/18/2020      ASSESSMENT AND PLAN 79 y.o. year old male  has a past medical history of Anemia, BENIGN PROSTATIC HYPERTROPHY, WITH OBSTRUCTION (05/28/2010), CAD (coronary artery disease), Chronic back pain, CIDP (chronic inflammatory demyelinating polyneuropathy) ([place]) (04/11/2012), Colon cancer ([place]) (dx'd 2000), COLONIC POLYPS, ADENOMATOUS, HX OF (02/27/5824), Complication of anesthesia, COPD (chronic obstructive pulmonary disease) ([place]), Diffuse axonal neuropathy (10/16/2014), DISH (diffuse idiopathic skeletal  hyperostosis) (12/02/2015), Diverticulitis, GENERALIZED OSTEOARTHROSIS UNSPECIFIED SITE (05/28/2010), GERD (05/28/2010), Heart murmur, History of gout, History of kidney stones, HYPERLIPIDEMIA (05/28/2010), Hypertension, IgM lambda paraproteinemia, Intrinsic asthma, unspecified (05/28/2010), Malignant neoplasm of descending colon ([place]) (05/28/2010), MITRAL VALVE PROLAPSE (05/28/2010), Monoclonal gammopathy of undetermined significance (12/11/2011), NSTEMI (non-ST elevated myocardial infarction) ([place]) (06/03/11), Open-angle glaucoma of both eyes (12/2012), OSA on CPAP (05/28/2010), PALPITATIONS, CHRONIC (05/28/2010), Parotid gland pain (2010), Peripheral neuropathy, Pneumonia (2-3 times), Presence of permanent cardiac pacemaker, 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), Sarcoidosis, UNSPECIFIED INFLAMMATORY AND TOXIC NEUROPATHY (05/28/2010), Vision loss, and VITAMIN D DEFICIENCY (05/28/2010). here with:  1. OSA on CPAP  - CPAP compliance excellent - Good treatment of AHI  - Encourage patient to use CPAP nightly and > 4 hours each night  2.  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  This included previsit chart review, lab review, study review, order entry, electronic health record documentation, patient education.  Ward Givens, MSN, NP-C 11/09/2020, 3:11 PM [facility] [address] [place], [place] [address] [phone]

## 2020-11-10 ENCOUNTER — Encounter: Payer: Self-pay | Admitting: Adult Health

## 2020-11-10 ENCOUNTER — Other Ambulatory Visit: Payer: Self-pay

## 2020-11-10 ENCOUNTER — Ambulatory Visit (INDEPENDENT_AMBULATORY_CARE_PROVIDER_SITE_OTHER): Payer: Medicare Other | Admitting: Adult Health

## 2020-11-10 ENCOUNTER — Ambulatory Visit: Payer: Medicare Other | Admitting: Adult Health

## 2020-11-10 VITALS — BP 128/76 | HR 74 | Ht 68.0 in | Wt 188.0 lb

## 2020-11-10 DIAGNOSIS — G63 Polyneuropathy in diseases classified elsewhere: Secondary | ICD-10-CM

## 2020-11-10 DIAGNOSIS — I25810 Atherosclerosis of coronary artery bypass graft(s) without angina pectoris: Secondary | ICD-10-CM

## 2020-11-10 DIAGNOSIS — D472 Monoclonal gammopathy: Secondary | ICD-10-CM

## 2020-11-10 NOTE — Patient Instructions (Signed)
Continue using CPAP nightly and greater than 4 hours each night. Will order compounded cream from [facility] If your symptoms worsen or you develop new symptoms please let us know.

## 2020-11-11 ENCOUNTER — Telehealth: Payer: Self-pay | Admitting: *Deleted

## 2020-11-11 NOTE — Telephone Encounter (Signed)
Fax confirmation received for RX for neruopathy cream [phone], [phone].

## 2020-11-12 DIAGNOSIS — N5201 Erectile dysfunction due to arterial insufficiency: Secondary | ICD-10-CM | POA: Diagnosis not present

## 2020-11-12 DIAGNOSIS — N401 Enlarged prostate with lower urinary tract symptoms: Secondary | ICD-10-CM | POA: Diagnosis not present

## 2020-11-12 DIAGNOSIS — R972 Elevated prostate specific antigen [PSA]: Secondary | ICD-10-CM | POA: Diagnosis not present

## 2020-11-12 DIAGNOSIS — R3912 Poor urinary stream: Secondary | ICD-10-CM | POA: Diagnosis not present

## 2020-11-13 NOTE — Telephone Encounter (Signed)
[facility] LVM, Calling with question on prescription received from [facility] for a compound cream pain cream. Going to have to dispense one of the alterative formula that she approved because of the pt's insurance coverage. and on the prescription says he is allergic to meloxicam and this formula we do dispense, which is formula 25 does contain. I need to check with prescriber to make sure  to leave meloxicam in the formula or if she needs [place] to . We can compound the medication without the meloxicam and you give [place] a call back to let us know at [phone].

## 2020-11-17 ENCOUNTER — Other Ambulatory Visit: Payer: Self-pay

## 2020-11-17 DIAGNOSIS — I350 Nonrheumatic aortic (valve) stenosis: Secondary | ICD-10-CM

## 2020-11-17 NOTE — Telephone Encounter (Signed)
Spoke to pt and he is not allergic, but it is contraindication with him being on eliquis.  He stated medicare would not pt for it, and with meloxicam it would be like taking OTC meds.  So he may defer to taking OTC (but that is his decision).

## 2020-11-17 NOTE — Telephone Encounter (Signed)
Derrick [name] can you call and see what type of reaction the patient has with NSAIDs.  I see NSAIDs listed as an allergy but does not indicate symptoms.  Depending on the symptoms associated the medication may have to be taken out at this cream.

## 2020-11-17 NOTE — Telephone Encounter (Signed)
Ok. Or we could ask [facility] to leave it out.

## 2020-11-17 NOTE — Telephone Encounter (Signed)
Spoke to Derrick [name] with [facility] and she noted the NSAIDS is drug interaction.  She stated they had spoken to Derrick [name] and he would have to pay out of pocket for the cream since [facility] did not pay for it and he stated he did not want it.

## 2020-11-28 ENCOUNTER — Other Ambulatory Visit: Payer: Self-pay

## 2020-11-28 ENCOUNTER — Encounter: Payer: Self-pay | Admitting: Internal Medicine

## 2020-11-28 ENCOUNTER — Ambulatory Visit (AMBULATORY_SURGERY_CENTER): Payer: Medicare Other | Admitting: Internal Medicine

## 2020-11-28 VITALS — BP 120/86 | HR 65 | Temp 98.0°F | Resp 22 | Ht 69.0 in | Wt 186.0 lb

## 2020-11-28 DIAGNOSIS — I2510 Atherosclerotic heart disease of native coronary artery without angina pectoris: Secondary | ICD-10-CM | POA: Diagnosis not present

## 2020-11-28 DIAGNOSIS — D509 Iron deficiency anemia, unspecified: Secondary | ICD-10-CM

## 2020-11-28 DIAGNOSIS — K317 Polyp of stomach and duodenum: Secondary | ICD-10-CM

## 2020-11-28 DIAGNOSIS — I10 Essential (primary) hypertension: Secondary | ICD-10-CM | POA: Diagnosis not present

## 2020-11-28 DIAGNOSIS — Z86004 Personal history of in-situ neoplasm of other and unspecified digestive organs: Secondary | ICD-10-CM

## 2020-11-28 DIAGNOSIS — K3189 Other diseases of stomach and duodenum: Secondary | ICD-10-CM | POA: Diagnosis not present

## 2020-11-28 DIAGNOSIS — K319 Disease of stomach and duodenum, unspecified: Secondary | ICD-10-CM | POA: Diagnosis not present

## 2020-11-28 DIAGNOSIS — K635 Polyp of colon: Secondary | ICD-10-CM | POA: Diagnosis not present

## 2020-11-28 DIAGNOSIS — D123 Benign neoplasm of transverse colon: Secondary | ICD-10-CM | POA: Diagnosis not present

## 2020-11-28 DIAGNOSIS — K219 Gastro-esophageal reflux disease without esophagitis: Secondary | ICD-10-CM | POA: Diagnosis not present

## 2020-11-28 DIAGNOSIS — D122 Benign neoplasm of ascending colon: Secondary | ICD-10-CM

## 2020-11-28 DIAGNOSIS — G4733 Obstructive sleep apnea (adult) (pediatric): Secondary | ICD-10-CM | POA: Diagnosis not present

## 2020-11-28 DIAGNOSIS — Z85038 Personal history of other malignant neoplasm of large intestine: Secondary | ICD-10-CM | POA: Diagnosis not present

## 2020-11-28 DIAGNOSIS — D120 Benign neoplasm of cecum: Secondary | ICD-10-CM | POA: Diagnosis not present

## 2020-11-28 DIAGNOSIS — D125 Benign neoplasm of sigmoid colon: Secondary | ICD-10-CM

## 2020-11-28 DIAGNOSIS — J449 Chronic obstructive pulmonary disease, unspecified: Secondary | ICD-10-CM | POA: Diagnosis not present

## 2020-11-28 MED ORDER — SODIUM CHLORIDE 0.9 % IV SOLN: 500.0000 mL | Freq: Once | INTRAVENOUS | Status: DC

## 2020-11-28 NOTE — Progress Notes (Signed)
Report to PACU, RN, vss, BBS= Clear.  

## 2020-11-28 NOTE — Op Note (Signed)
[facility] Patient Name: Nyko Gell Procedure Date: 11/28/2020 1:32 PM MRN: [id number] Endoscopist: Jerene Bears , MD Age: 79 Referring MD:  Date of Birth: [birth year] Gender: Male Account #: [id number] Procedure:                Upper GI endoscopy Indications:              Iron deficiency anemia, Gastro-esophageal reflux                            disease Medicines:                Monitored Anesthesia Care Procedure:                Pre-Anesthesia Assessment:                           - Prior to the procedure, a History and Physical                            was performed, and patient medications and                            allergies were reviewed. The patient's tolerance of                            previous anesthesia was also reviewed. The risks                            and benefits of the procedure and the sedation                            options and risks were discussed with the patient.                            All questions were answered, and informed consent                            was obtained. Prior Anticoagulants: The patient has                            taken Eliquis (apixaban), last dose was 2 days                            prior to procedure. ASA Grade Assessment: III - A                            patient with severe systemic disease. After                            reviewing the risks and benefits, the patient was                            deemed in satisfactory condition to undergo the  procedure.                           After obtaining informed consent, the endoscope was                            passed under direct vision. Throughout the                            procedure, the patient's blood pressure, pulse, and                            oxygen saturations were monitored continuously. The                            Endoscope was introduced through the mouth, and                            advanced to  the second part of duodenum. The upper                            GI endoscopy was accomplished without difficulty.                            The patient tolerated the procedure well. Scope In: Scope Out: Findings:                 The examined esophagus was normal.                           A single 7 mm pedunculated polyp was found in the                            gastric body. The polyp was hyperemic. Given iron                            deficiency and concern for future bleeding                            potential the polyp was removed with a hot snare.                            Resection and retrieval were complete. To prevent                            bleeding after the polypectomy, one hemostatic clip                            was successfully placed given need to resume                            anticoagulation therapy. There was no bleeding                            during,  or at the end, of the procedure.                           A few diminutive sessile polyps were found in the                            gastric fundus and in the gastric body. These                            polyps were not hyperemic and consistent with                            benign fundic gland polyps.                           Mucosal thickening and scarring was found in the                            gastric antrum and prepyloric stomach. Query healed                            peptic ulcer disease. Biopsies were taken with a                            cold forceps for histology and Helicobacter pylori                            testing.                           The examined duodenum was normal. Biopsies for                            histology were taken with a cold forceps for                            evaluation of celiac disease. Complications:            No immediate complications. Estimated Blood Loss:     Estimated blood loss was minimal. Impression:               - Normal  esophagus.                           - A single erythematous gastric polyp. Resected and                            retrieved. Clip was placed.                           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                           -  Thickened mucosa in the antrum and prepyloric                            stomach (likely healed ulcer). Biopsied.                           - Normal examined duodenum. Biopsied. Recommendation:           - Patient has a contact number available for                            emergencies. The signs and symptoms of potential                            delayed complications were discussed with the                            patient. Return to normal activities tomorrow.                            Written discharge instructions were provided to the                            patient.                           - Resume previous diet.                           - Continue present medications.                           - Await pathology results.                           - See the other procedure note for documentation of                            additional recommendations. Jerene Bears, MD 11/28/2020 2:19:05 PM This report has been signed electronically.

## 2020-11-28 NOTE — Patient Instructions (Signed)
Handout on polyps given.  Resume Eliquis at prior dose tommorrow   YOU HAD AN ENDOSCOPIC PROCEDURE TODAY AT [place]:   Refer to the procedure report that was given to you for any specific questions about what was found during the examination.  If the procedure report does not answer your questions, please call your gastroenterologist to clarify.  If you requested that your care partner not be given the details of your procedure findings, then the procedure report has been included in a sealed envelope for you to review at your convenience later.  YOU SHOULD EXPECT: Some feelings of bloating in the abdomen. Passage of more gas than usual.  Walking can help get rid of the air that was put into your GI tract during the procedure and reduce the bloating. If you had a lower endoscopy (such as a colonoscopy or flexible sigmoidoscopy) you may notice spotting of blood in your stool or on the toilet paper. If you underwent a bowel prep for your procedure, you may not have a normal bowel movement for a few days.  Please Note:  You might notice some irritation and congestion in your nose or some drainage.  This is from the oxygen used during your procedure.  There is no need for concern and it should clear up in a day or so.  SYMPTOMS TO REPORT IMMEDIATELY:   Following lower endoscopy (colonoscopy or flexible sigmoidoscopy):  Excessive amounts of blood in the stool  Significant tenderness or worsening of abdominal pains  Swelling of the abdomen that is new, acute  Fever of 100F or higher   Following upper endoscopy (EGD)  Vomiting of blood or coffee ground material  New chest pain or pain under the shoulder blades  Painful or persistently difficult swallowing  New shortness of breath  Fever of 100F or higher  Black, tarry-looking stools  For urgent or emergent issues, a gastroenterologist can be reached at any hour by calling [phone]. Do not use MyChart messaging for  urgent concerns.    DIET:  We do recommend a small meal at first, but then you may proceed to your regular diet.  Drink plenty of fluids but you should avoid alcoholic beverages for 24 hours.  ACTIVITY:  You should plan to take it easy for the rest of today and you should NOT DRIVE or use heavy machinery until tomorrow (because of the sedation medicines used during the test).    FOLLOW UP: Our staff will call the number listed on your records 48-72 hours following your procedure to check on you and address any questions or concerns that you may have regarding the information given to you following your procedure. If we do not reach you, we will leave a message.  We will attempt to reach you two times.  During this call, we will ask if you have developed any symptoms of COVID 19. If you develop any symptoms (ie: fever, flu-like symptoms, shortness of breath, cough etc.) before then, please call [phone].  If you test positive for Covid 19 in the 2 weeks post procedure, please call and report this information to [place].    If any biopsies were taken you will be contacted by phone or by letter within the next 1-3 weeks.  Please call us at [phone] if you have not heard about the biopsies in 3 weeks.    SIGNATURES/CONFIDENTIALITY: You and/or your care partner have signed paperwork which will be entered into your electronic medical record.  These signatures attest to the fact that that the information above on your After Visit Summary has been reviewed and is understood.  Full responsibility of the confidentiality of this discharge information lies with you and/or your care-partner.

## 2020-11-28 NOTE — Op Note (Signed)
Derrick [name]: Derrick [name] Procedure Date: 11/28/2020 1:31 PM MRN: [id number] Endoscopist: Jerene Bears , MD Age: 79 Referring MD:  Date of Birth: 17-Jun-1941 Gender: Male Account #: [id number] Procedure:                Colonoscopy Indications:              High risk colon cancer surveillance: Personal                            history of colon cancer, adenomatous and sessile                            serrated colon polyps, Last colonoscopy: November                            2018; also iron deficiency anemia Medicines:                Monitored Anesthesia Care Procedure:                Pre-Anesthesia Assessment:                           - Prior to the procedure, a History and Physical                            was performed, and patient medications and                            allergies were reviewed. The patient's tolerance of                            previous anesthesia was also reviewed. The risks                            and benefits of the procedure and the sedation                            options and risks were discussed with the patient.                            All questions were answered, and informed consent                            was obtained. Prior Anticoagulants: The patient has                            taken Eliquis (apixaban), last dose was 2 days                            prior to procedure. ASA Grade Assessment: III - A                            patient with severe systemic disease. After  reviewing the risks and benefits, the patient was                            deemed in satisfactory condition to undergo the                            procedure.                           After obtaining informed consent, the colonoscope                            was passed under direct vision. Throughout the                            procedure, the patient's blood pressure, pulse, and                             oxygen saturations were monitored continuously. The                            Colonoscope was introduced through the anus and                            advanced to the cecum, identified by appendiceal                            orifice and ileocecal valve. The colonoscopy was                            performed without difficulty. The patient tolerated                            the procedure well. The quality of the bowel                            preparation was good. The ileocecal valve,                            appendiceal orifice, and rectum were photographed. Scope In: 1:52:46 PM Scope Out: 2:10:52 PM Scope Withdrawal Time: 0 hours 14 minutes 29 seconds  Total Procedure Duration: 0 hours 18 minutes 6 seconds  Findings:                 The digital rectal exam was normal.                           A 3 mm polyp was found in the ileocecal valve. The                            polyp was sessile. The polyp was removed with a                            cold biopsy forceps. Resection and retrieval were  complete.                           A 4 mm polyp was found in the ascending colon. The                            polyp was sessile. The polyp was removed with a                            cold snare. Resection and retrieval were complete.                           A 4 mm polyp was found in the transverse colon. The                            polyp was sessile. The polyp was removed with a                            cold snare. Resection and retrieval were complete.                           Two sessile polyps were found in the sigmoid colon.                            The polyps were 4 to 5 mm in size. These polyps                            were removed with a cold snare. Resection and                            retrieval were complete.                           There was evidence of a prior end-to-end                            colo-colonic anastomosis in  the descending colon.                            This was patent and was characterized by healthy                            appearing mucosa.                           Internal hemorrhoids were found during                            retroflexion. The hemorrhoids were small. Complications:            No immediate complications. Estimated Blood Loss:     Estimated blood loss was minimal. Impression:               - One 3 mm polyp at the ileocecal valve, removed  with a cold biopsy forceps. Resected and retrieved.                           - One 4 mm polyp in the ascending colon, removed                            with a cold snare. Resected and retrieved.                           - One 4 mm polyp in the transverse colon, removed                            with a cold snare. Resected and retrieved.                           - Two 4 to 5 mm polyps in the sigmoid colon,                            removed with a cold snare. Resected and retrieved.                           - Patent end-to-end colo-colonic anastomosis,                            characterized by healthy appearing mucosa.                           - Internal hemorrhoids. Recommendation:           - Patient has a contact number available for                            emergencies. The signs and symptoms of potential                            delayed complications were discussed with the                            patient. Return to normal activities tomorrow.                            Written discharge instructions were provided to the                            patient.                           - Resume previous diet.                           - Continue present medications.                           - Resume Eliquis (apixaban) at prior dose tomorrow.  Refer to managing physician for further adjustment                            of therapy.                           -  Await pathology results.                           - In 2 months check FOBT cards. If positive proceed                            to VCE.                           - Monitor Hgb and iron studies and replace as                            needed. He will follow with hematology.                           - Repeat colonoscopy is recommended. The                            colonoscopy date will be determined after pathology                            results from today's exam become available for                            review. Jerene Bears, MD 11/28/2020 2:23:19 PM This report has been signed electronically.

## 2020-11-28 NOTE — Progress Notes (Signed)
Called to room to assist during endoscopic procedure.  Patient ID and intended procedure confirmed with present staff. Received instructions for my participation in the procedure from the performing physician.  

## 2020-12-02 ENCOUNTER — Telehealth: Payer: Self-pay

## 2020-12-02 ENCOUNTER — Telehealth: Payer: Self-pay | Admitting: *Deleted

## 2020-12-02 NOTE — Telephone Encounter (Signed)
First attempt follow up call to pt, lm on vm 

## 2020-12-02 NOTE — Telephone Encounter (Signed)
Second follow up call made,

## 2020-12-08 ENCOUNTER — Other Ambulatory Visit: Payer: Self-pay | Admitting: Cardiovascular Disease

## 2020-12-08 ENCOUNTER — Other Ambulatory Visit: Payer: Self-pay | Admitting: Cardiology

## 2020-12-08 DIAGNOSIS — I4891 Unspecified atrial fibrillation: Secondary | ICD-10-CM

## 2020-12-09 ENCOUNTER — Encounter: Payer: Self-pay | Admitting: Internal Medicine

## 2020-12-09 NOTE — Telephone Encounter (Signed)
Eliquis 5mg  refill request received. Patient is 79 years old, weight-84.4kg, Crea-1.01 on 10/22/2020, Diagnosis-Afib, and last seen by Dr. Curt Bears on 09/23/20. Dose is appropriate based on dosing criteria. Will send in refill to requested pharmacy.

## 2020-12-11 ENCOUNTER — Other Ambulatory Visit: Payer: Self-pay | Admitting: Cardiovascular Disease

## 2020-12-22 ENCOUNTER — Ambulatory Visit (INDEPENDENT_AMBULATORY_CARE_PROVIDER_SITE_OTHER): Payer: Medicare Other

## 2020-12-22 DIAGNOSIS — I442 Atrioventricular block, complete: Secondary | ICD-10-CM

## 2020-12-24 LAB — CUP PACEART REMOTE DEVICE CHECK
Battery Remaining Longevity: 49 mo
Battery Voltage: 2.99 V
Brady Statistic AP VP Percent: 96.76 %
Brady Statistic AP VS Percent: 0 %
Brady Statistic AS VP Percent: 3.23 %
Brady Statistic AS VS Percent: 0 %
Brady Statistic RA Percent Paced: 96.05 %
Brady Statistic RV Percent Paced: 99.84 %
Date Time Interrogation Session: 20211227190927
Implantable Lead Implant Date: 20170517
Implantable Lead Implant Date: 20170517
Implantable Lead Location: 753859
Implantable Lead Location: 753860
Implantable Lead Model: 5076
Implantable Lead Model: 5076
Implantable Pulse Generator Implant Date: 20170517
Lead Channel Impedance Value: 361 Ohm
Lead Channel Impedance Value: 399 Ohm
Lead Channel Impedance Value: 475 Ohm
Lead Channel Impedance Value: 570 Ohm
Lead Channel Pacing Threshold Amplitude: 0.75 V
Lead Channel Pacing Threshold Amplitude: 1 V
Lead Channel Pacing Threshold Pulse Width: 0.4 ms
Lead Channel Pacing Threshold Pulse Width: 0.4 ms
Lead Channel Sensing Intrinsic Amplitude: 1.75 mV
Lead Channel Sensing Intrinsic Amplitude: 1.75 mV
Lead Channel Sensing Intrinsic Amplitude: 11.875 mV
Lead Channel Sensing Intrinsic Amplitude: 11.875 mV
Lead Channel Setting Pacing Amplitude: 2 V
Lead Channel Setting Pacing Amplitude: 2.5 V
Lead Channel Setting Pacing Pulse Width: 0.4 ms
Lead Channel Setting Sensing Sensitivity: 2.8 mV

## 2021-01-05 NOTE — Progress Notes (Signed)
Remote pacemaker transmission.   

## 2021-01-09 ENCOUNTER — Telehealth: Payer: Self-pay | Admitting: *Deleted

## 2021-01-09 MED ORDER — METOPROLOL SUCCINATE ER 25 MG PO TB24: 25.0000 mg | ORAL_TABLET | Freq: Every day | ORAL | 3 refills | Status: DC

## 2021-01-09 NOTE — Telephone Encounter (Signed)
Informed patient of results and verbal understanding expressed. Pt agreeable to plan. Advised to call office if SE occur after starting.

## 2021-01-29 MED ORDER — METOPROLOL SUCCINATE ER 25 MG PO TB24
25.0000 mg | ORAL_TABLET | Freq: Every day | ORAL | 2 refills | Status: DC
Start: 2021-01-29 — End: 2021-10-20

## 2021-02-02 ENCOUNTER — Other Ambulatory Visit: Payer: Self-pay

## 2021-02-02 DIAGNOSIS — D509 Iron deficiency anemia, unspecified: Secondary | ICD-10-CM

## 2021-03-04 DIAGNOSIS — H401112 Primary open-angle glaucoma, right eye, moderate stage: Secondary | ICD-10-CM | POA: Diagnosis not present

## 2021-03-04 DIAGNOSIS — H401122 Primary open-angle glaucoma, left eye, moderate stage: Secondary | ICD-10-CM | POA: Diagnosis not present

## 2021-03-06 ENCOUNTER — Other Ambulatory Visit: Payer: Self-pay

## 2021-03-06 ENCOUNTER — Ambulatory Visit (INDEPENDENT_AMBULATORY_CARE_PROVIDER_SITE_OTHER): Payer: Medicare Other | Admitting: Cardiology

## 2021-03-06 ENCOUNTER — Encounter: Payer: Self-pay | Admitting: Cardiology

## 2021-03-06 VITALS — BP 118/64 | HR 72 | Ht 69.0 in | Wt 190.8 lb

## 2021-03-06 DIAGNOSIS — I442 Atrioventricular block, complete: Secondary | ICD-10-CM

## 2021-03-06 DIAGNOSIS — I480 Paroxysmal atrial fibrillation: Secondary | ICD-10-CM

## 2021-03-06 NOTE — Patient Instructions (Signed)
Medication Instructions:  Your physician recommends that you continue on your current medications as directed. Please refer to the Current Medication list given to you today.  *If you need a refill on your cardiac medications before your next appointment, please call your pharmacy*   Lab Work: None ordered If you have labs (blood work) drawn today and your tests are completely normal, you will receive your results only by: [name] MyChart Message (if you have MyChart) OR . A paper copy in the mail If you have any lab test that is abnormal or we need to change your treatment, we will call you to review the results.   Testing/Procedures: None ordered   Follow-Up: At [facility], you and your health needs are our priority.  As part of our continuing mission to provide you with exceptional heart care, we have created designated Provider Care Teams.  These Care Teams include your primary Cardiologist (physician) and Advanced Practice Providers (APPs -  Physician Assistants and Nurse Practitioners) who all work together to provide you with the care you need, when you need it.  We recommend signing up for the patient portal called "MyChart".  Sign up information is provided on this After Visit Summary.  MyChart is used to connect with patients for Virtual Visits (Telemedicine).  Patients are able to view lab/test results, encounter notes, upcoming appointments, etc.  Non-urgent messages can be sent to your provider as well.   To learn more about what you can do with MyChart, go to [email or web address].    Remote monitoring is used to monitor your Pacemaker or ICD from home. This monitoring reduces the number of office visits required to check your device to one time per year. It allows [place] to keep an eye on the functioning of your device to ensure it is working properly. You are scheduled for a device check from home on 03/23/2021. You may send your transmission at any time that day. If you have a  wireless device, the transmission will be sent automatically. After your physician reviews your transmission, you will receive a postcard with your next transmission date.  Your next appointment:   6 month(s)  The format for your next appointment:   In Person  Provider:   Allegra Lai, MD   Thank you for choosing [facility]!!   Trinidad Curet, RN [phone]    Other Instructions Hold your Metoprolol for several weeks.  Please let us know if improvement after stopping.

## 2021-03-06 NOTE — Progress Notes (Signed)
Electrophysiology Office Note   Date:  03/06/2021   ID:  Derrick, [name] [birth year], MRN [id number]  PCP:  Derrick Sanders, MD  Cardiologist:  Derrick [name] Primary Electrophysiologist:  Derrick Hammers [name], MD    No chief complaint on file.    History of Present Illness: Derrick [name] is a 80 y.o. male who presents today for electrophysiology evaluation.     He has a history of coronary artery disease and stent to the RCA CABG June 2020, PE, pulmonary sarcoid, MGUS.  He has sick sinus syndrome and is status post Medtronic dual-chamber pacemaker.  He also has atrial fibrillation and is now on Multaq.  Today, denies symptoms of palpitations, chest pain, shortness of breath, orthopnea, PND, lower extremity edema, claudication, dizziness, presyncope, syncope, bleeding, or neurologic sequela. The patient is tolerating medications without difficulties.  Since last being seen he has done well other than back pain.  He feels that the back pain is potentially related to his metoprolol.  Aside from that, he has done well and has no major complaints.   Past Medical History:  Diagnosis Date  . Anemia    iron deficiency anemia- infusion on 02/15/2020 and 02/22/2020  . BENIGN PROSTATIC HYPERTROPHY, WITH OBSTRUCTION 05/28/2010  . CAD (coronary artery disease)    a. s/p NSTEMI 06/01/11: DES to RCA;  b. cath 06/25/11:   dLM 50-60% (FFR 0.87), prox to mid LAD 40-50%, D1 50%, pCFX 50%, RCA stent ok, dPDA 80%, EF 55-60%.  His FFR was felt to be negative and therefore medical therapy was recommended ;  echo 6/12: EF 55-60%, mild AS   . Chronic back pain   . 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 Colon cancer ([place]) dx'd 2000   "left"  . COLONIC POLYPS, ADENOMATOUS, HX OF 05/28/2010  . Complication of anesthesia    "stopped breathing; related to my sleep apnea" & urinary retention   . COPD (chronic obstructive pulmonary disease) ([place])    "associated w/lung  sarcoidosis"  . 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 DISH (diffuse idiopathic skeletal hyperostosis) 12/02/2015  . Diverticulitis   . GENERALIZED OSTEOARTHROSIS UNSPECIFIED SITE 05/28/2010  . GERD 05/28/2010  . Heart murmur   . History of gout   . History of kidney stones   . HYPERLIPIDEMIA 05/28/2010  . Hypertension   . IgM lambda paraproteinemia   . 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 MITRAL VALVE PROLAPSE 05/28/2010  . 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 NSTEMI (non-ST elevated myocardial infarction) ([place]) 06/03/11  . 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  . OSA on CPAP 05/28/2010  . PALPITATIONS, CHRONIC 05/28/2010  . Parotid gland pain 2010   infection  . Peripheral neuropathy    "tx'd w/targeted chemo" (05/12/2016)  . Pneumonia 2-3 times  . Presence of permanent cardiac pacemaker    Medtronic  . 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 Sarcoidosis    pulmonalis  . UNSPECIFIED INFLAMMATORY AND TOXIC NEUROPATHY 05/28/2010  . Vision loss   . VITAMIN D DEFICIENCY 05/28/2010   Past Surgical History:  Procedure Laterality Date  . APPENDECTOMY  1954   age 40  . CATARACT EXTRACTION W/ INTRAOCULAR LENS IMPLANT Left   . COLON SURGERY  2000   decending colon   . CORONARY ANGIOPLASTY WITH STENT PLACEMENT  06/03/11  . CORONARY ARTERY BYPASS GRAFT N/A 06/05/2019   Procedure: CORONARY ARTERY BYPASS GRAFTING (  CABG) TIMES THREE USING LEFT INTERNAL MAMMARY ARTERY AND RIGHT GREATER SEPHANOUS VEIN;  Surgeon: Derrick Poot, MD;  Location: [facility];  Service: Open Heart Surgery;  Laterality: N/A;  . ELECTROPHYSIOLOGIC STUDY N/A 05/12/2016   Procedure: Cardioversion;  Surgeon: Derrick Cinelli [name], MD;  Location: [facility];  Service: Cardiovascular;  Laterality: N/A;  . EP IMPLANTABLE DEVICE N/A 05/12/2016   Procedure: Pacemaker Implant;  Surgeon: Derrick Soulier [name], MD;   Location: [facility];  Service: Cardiovascular;  Laterality: N/A;  . EYE SURGERY     bilateral cataract with lens implant  . HERNIA REPAIR  01/2019   left inguinal hernia repair  . INGUINAL HERNIA REPAIR Left 02/21/2019   Procedure: LEFT INGUINAL HERNIA REPAIR WITH MESH;  Surgeon: Derrick Luna, MD;  Location: [facility];  Service: General;  Laterality: Left;  . INSERT / REPLACE / REMOVE PACEMAKER  05/12/2016  . INTRAVASCULAR PRESSURE WIRE/FFR STUDY N/A 05/15/2019   Procedure: INTRAVASCULAR PRESSURE WIRE/FFR STUDY;  Surgeon: Derrick Mocha, MD;  Location: [facility];  Service: Cardiovascular;  Laterality: N/A;  . KNEE ARTHROSCOPY Right 2003  . LEFT HEART CATH AND CORONARY ANGIOGRAPHY N/A 05/15/2019   Procedure: LEFT HEART CATH AND CORONARY ANGIOGRAPHY;  Surgeon: Derrick Mocha, MD;  Location: [facility];  Service: Cardiovascular;  Laterality: N/A;  . LEFT HEART CATHETERIZATION WITH CORONARY ANGIOGRAM N/A 03/31/2015   Procedure: LEFT HEART CATHETERIZATION WITH CORONARY ANGIOGRAM;  Surgeon: Derrick Mocha, MD;  Location: [facility];  Service: Cardiovascular;  Laterality: N/A;  . LUNG SURGERY  2001   "open lung dissection"  . MOHS SURGERY Left ~ 2008   ear  . PROSTATE BIOPSY  2010  . TEE WITHOUT CARDIOVERSION N/A 06/05/2019   Procedure: TRANSESOPHAGEAL ECHOCARDIOGRAM (TEE);  Surgeon: Derrick [name], Derrick Salina, MD;  Location: [facility];  Service: Open Heart Surgery;  Laterality: N/A;  . TONSILLECTOMY  1950  . TOTAL KNEE ARTHROPLASTY Right 03/11/2020   Procedure: TOTAL KNEE ARTHROPLASTY;  Surgeon: Derrick Cancel, MD;  Location: [facility];  Service: Orthopedics;  Laterality: Right;  70 mins     Current Outpatient Medications  Medication Sig Dispense Refill  . albuterol (VENTOLIN HFA) 108 (90 Base) MCG/ACT inhaler Inhale 2 puffs into the lungs every 6 (six) hours as needed for wheezing or shortness of breath. 8 g 2  . amLODipine (NORVASC) 5 MG tablet Take 1 tablet (5 mg total) by mouth daily.  90 tablet 3  . bimatoprost (LUMIGAN) 0.01 % SOLN Place 1 drop into both eyes at bedtime.    . Cholecalciferol 25 MCG (1000 UT) tablet Take 1,000 Units by mouth daily.     . dorzolamide-timolol (COSOPT) 22.3-6.8 MG/ML ophthalmic solution Place 1 drop into both eyes 2 (two) times daily.     [name] dronedarone (MULTAQ) 400 MG tablet TAKE 1 TABLET BY MOUTH TWICE DAILY WITH MEALS 180 tablet 2  . ELIQUIS 5 MG TABS tablet Take 1 tablet by mouth twice daily 180 tablet 2  . finasteride (PROSCAR) 5 MG tablet Take 5 mg by mouth daily after breakfast.     . fluticasone (FLONASE) 50 MCG/ACT nasal spray Place 2 sprays into both nostrils 2 (two) times daily.    . Fluticasone Propionate, Inhal, (FLOVENT DISKUS) 100 MCG/BLIST AEPB Inhale 1 Inhaler into the lungs 2 (two) times daily. 60 each 2  . metoprolol succinate (TOPROL XL) 25 MG 24 hr tablet Take 1 tablet (25 mg total) by mouth at bedtime. 90 tablet 2  .  Multiple Vitamin (MULTIVITAMIN WITH MINERALS) TABS tablet Take 1 tablet by mouth daily.    . NONFORMULARY OR COMPOUNDED ITEM Neuropathy cream transdermal therapeutics    . pantoprazole (PROTONIX) 40 MG tablet Take 1 tablet by mouth once daily 90 tablet 2  . Polyethyl Glycol-Propyl Glycol (SYSTANE OP) Place 1 drop into both eyes daily as needed (dry eyes).    . rosuvastatin (CRESTOR) 20 MG tablet Take 1 tablet (20 mg total) by mouth daily. 90 tablet 3  . tadalafil (CIALIS) 5 MG tablet Take 5 mg by mouth at bedtime.     . triamterene-hydrochlorothiazide (MAXZIDE-25) 37.5-25 MG tablet Take 1 tablet by mouth daily. 90 tablet 3   No current facility-administered medications for this visit.    Allergies:   Miralax [polyethylene glycol] and Nsaids   Social History:  The patient  reports that he has never smoked. He has never used smokeless tobacco. He reports current alcohol use. He reports that he does not use drugs.   Family History:  The patient's family history includes Arrhythmia (age of onset: 53) in his  brother; Arthritis in his mother; Cancer in his brother; Colon polyps in his mother; Congestive Heart Failure in his brother; Coronary artery disease in his brother and mother; Heart attack in his brother; Heart disease in his mother.   ROS:  Please see the history of present illness.   Otherwise, review of systems is positive for none.   All other systems are reviewed and negative.   PHYSICAL EXAM: VS:  BP 118/64   Pulse 72   Ht 5\' 9"  (1.753 m)   Wt 190 lb 12.8 oz (86.5 kg)   SpO2 98%   BMI 28.18 kg/m  , BMI Body mass index is 28.18 kg/m. GEN: Well nourished, well developed, in no acute distress  HEENT: normal  Neck: no JVD, carotid bruits, or masses Cardiac: RRR; no murmurs, rubs, or gallops,no edema  Respiratory:  clear to auscultation bilaterally, normal work of breathing GI: soft, nontender, nondistended, + BS MS: no deformity or atrophy  Skin: warm and dry, device site well healed Neuro:  Strength and sensation are intact Psych: euthymic mood, full affect  EKG:  EKG is ordered today. Personal review of the ekg ordered shows sinus rhythm, ventricular paced, PACs  Personal review of the device interrogation today. Results in [place]: 06/18/2020: TSH 2.06 10/22/2020: ALT 12; BUN 15; Creatinine, Ser 1.01; Potassium 4.0; Sodium 137 10/28/2020: Hemoglobin 11.9; Platelets 178.0    Lipid Panel     Component Value Date/Time   CHOL 152 10/22/2020 0823   CHOL 147 05/21/2020 0721   TRIG 47.0 10/22/2020 0823   HDL 71.90 10/22/2020 0823   HDL 66 05/21/2020 0721   CHOLHDL 2 10/22/2020 0823   VLDL 9.4 10/22/2020 0823   LDLCALC 70 10/22/2020 0823   LDLCALC 71 05/21/2020 0721     Wt Readings from Last 3 Encounters:  03/06/21 190 lb 12.8 oz (86.5 kg)  11/28/20 186 lb (84.4 kg)  11/10/20 188 lb (85.3 kg)      Other studies Reviewed: Additional studies/ records that were reviewed today include: TTE 05/22/19 Review of the above records today demonstrates:    1.  The left ventricle has normal systolic function, with an ejection fraction of 55-60%. The cavity size was normal. Left ventricular diastolic Doppler parameters are consistent with pseudonormalization. Indeterminate filling pressures.  2. Apical hypokinesis.  3. The right ventricle has normal systolic function. The cavity was normal. There  is no increase in right ventricular wall thickness.  4. Left atrial size was severely dilated.  5. There is mild mitral annular calcification present. No evidence of mitral valve stenosis.  6. The aortic valve is tricuspid. Moderate thickening of the aortic valve. Moderate calcification of the aortic valve. Mild stenosis of the aortic valve.  7. There is mild to moderate dilatation of the ascending aorta measuring 43 mm.   ASSESSMENT AND PLAN:  1.  Paroxysmal atrial fibrillation: Currently on Eliquis and Multaq.  High risk medication monitoring.  CHA2DS2-VASc of 3.  Minimal atrial fibrillation noted.  I [place] have him hold his metoprolol for 2 weeks to see if his back pain improves.  If not, may restart it.  2.  Hypertension: Currently well controlled  3.  Hyperlipidemia: Continue statin  4.  Coronary artery disease: Status post CABG x6.  No current chest pain.  5.  Sick sinus syndrome: Status post Medtronic dual-chamber pacemaker implanted 05/12/2016.  Device functioning appropriately.  No changes at this time.    6.  Nonsustained VT: Had to episodes December 12.  None since then.  Continue to monitor.  Current medicines are reviewed at length with the patient today.   The patient does not have concerns regarding his medicines.  The following changes were made today: None  Labs/ tests ordered today include:  Orders Placed This Encounter  Procedures  . EKG 12-Lead     Disposition:   FU with Abdelaziz Westenberger 6 months  Signed, Tadd Holtmeyer [name], MD  03/06/2021 8:25 AM     [facility] [address] [place]  [address] [phone] (office) [phone] (fax)

## 2021-03-07 ENCOUNTER — Other Ambulatory Visit: Payer: Self-pay | Admitting: Cardiovascular Disease

## 2021-03-09 DIAGNOSIS — Z96651 Presence of right artificial knee joint: Secondary | ICD-10-CM | POA: Diagnosis not present

## 2021-03-19 DIAGNOSIS — E663 Overweight: Secondary | ICD-10-CM | POA: Diagnosis not present

## 2021-03-19 DIAGNOSIS — G6181 Chronic inflammatory demyelinating polyneuritis: Secondary | ICD-10-CM | POA: Diagnosis not present

## 2021-03-19 DIAGNOSIS — D860 Sarcoidosis of lung: Secondary | ICD-10-CM | POA: Diagnosis not present

## 2021-03-19 DIAGNOSIS — M2550 Pain in unspecified joint: Secondary | ICD-10-CM | POA: Diagnosis not present

## 2021-03-19 DIAGNOSIS — M150 Primary generalized (osteo)arthritis: Secondary | ICD-10-CM | POA: Diagnosis not present

## 2021-03-19 DIAGNOSIS — D472 Monoclonal gammopathy: Secondary | ICD-10-CM | POA: Diagnosis not present

## 2021-03-19 DIAGNOSIS — Z6827 Body mass index (BMI) 27.0-27.9, adult: Secondary | ICD-10-CM | POA: Diagnosis not present

## 2021-03-19 DIAGNOSIS — M5489 Other dorsalgia: Secondary | ICD-10-CM | POA: Diagnosis not present

## 2021-03-19 DIAGNOSIS — M25561 Pain in right knee: Secondary | ICD-10-CM | POA: Diagnosis not present

## 2021-03-23 ENCOUNTER — Ambulatory Visit (INDEPENDENT_AMBULATORY_CARE_PROVIDER_SITE_OTHER): Payer: Medicare Other

## 2021-03-23 ENCOUNTER — Telehealth: Payer: Self-pay | Admitting: Cardiology

## 2021-03-23 DIAGNOSIS — I4729 Other ventricular tachycardia: Secondary | ICD-10-CM

## 2021-03-23 DIAGNOSIS — I472 Ventricular tachycardia: Secondary | ICD-10-CM

## 2021-03-23 NOTE — Telephone Encounter (Signed)
Patient has sent in a transmission and needs a nurse to go over it and let Sherri know so that she can assess

## 2021-03-23 NOTE — Telephone Encounter (Signed)
Device clinic reviewed transmission and will include report in this note. Pt aware he was having short back to back episodes since 3/27 lasting only a min/two. Pt reports he hasn't felt much of anything. BP running normal Been on Prednisone since 3/25 (20 day regimen and then taper off.  He is taking 15 mg daily) Restarted Metoprolol on 3/24 Just tired, but no CP, SOB, dizziness/light headedness or syncope. Pt aware we will be in touch once transmission and note are reviewed by Dr. Curt Bears. Patient verbalized understanding and agreeable to plan.

## 2021-03-23 NOTE — Telephone Encounter (Signed)
Remote transmission reviewed. 73 AT/AF episodes with longest lasting 15 minutes , AT/AF burden 1% since 03/06/21.

## 2021-03-23 NOTE — Telephone Encounter (Signed)
   Patient c/o Palpitations:  High priority if patient c/o lightheadedness, shortness of breath, or chest pain  1) How long have you had palpitations/irregular HR/ Afib? Are you having the symptoms now? Since ~8pm last night. His Apple Watch told him  2) Are you currently experiencing lightheadedness, SOB or CP? Not that he can notice  3) Do you have a history of afib (atrial fibrillation) or irregular heart rhythm? yes  4) Have you checked your BP or HR? (document readings if available):   HR 65. Patient states his BP machine was not working and he was not able to take it while I had him on the phone   5) Are you experiencing any other symptoms?   Patient would like to speak to [facility] and get her recommendations about taking a two week break from his metoprolol.The break ended 03/20/21 and he is back on the medication.    Patient states he also sent in his remote transmission today and wanted to know if there were any signs of afib on his transmission

## 2021-03-24 LAB — CUP PACEART REMOTE DEVICE CHECK
Battery Remaining Longevity: 51 mo
Battery Voltage: 2.98 V
Brady Statistic AP VP Percent: 81.38 %
Brady Statistic AP VS Percent: 0 %
Brady Statistic AS VP Percent: 18.61 %
Brady Statistic AS VS Percent: 0.01 %
Brady Statistic RA Percent Paced: 80.03 %
Brady Statistic RV Percent Paced: 99.94 %
Date Time Interrogation Session: 20220328075739
Implantable Lead Implant Date: 20170517
Implantable Lead Implant Date: 20170517
Implantable Lead Location: 753859
Implantable Lead Location: 753860
Implantable Lead Model: 5076
Implantable Lead Model: 5076
Implantable Pulse Generator Implant Date: 20170517
Lead Channel Impedance Value: 361 Ohm
Lead Channel Impedance Value: 418 Ohm
Lead Channel Impedance Value: 456 Ohm
Lead Channel Impedance Value: 570 Ohm
Lead Channel Pacing Threshold Amplitude: 0.75 V
Lead Channel Pacing Threshold Amplitude: 0.875 V
Lead Channel Pacing Threshold Pulse Width: 0.4 ms
Lead Channel Pacing Threshold Pulse Width: 0.4 ms
Lead Channel Sensing Intrinsic Amplitude: 10.25 mV
Lead Channel Sensing Intrinsic Amplitude: 10.25 mV
Lead Channel Sensing Intrinsic Amplitude: 2.25 mV
Lead Channel Sensing Intrinsic Amplitude: 2.25 mV
Lead Channel Setting Pacing Amplitude: 2 V
Lead Channel Setting Pacing Amplitude: 2.5 V
Lead Channel Setting Pacing Pulse Width: 0.4 ms
Lead Channel Setting Sensing Sensitivity: 2.8 mV

## 2021-03-24 NOTE — Telephone Encounter (Signed)
Pt aware Dr. Curt Bears reviewed. Pt aware the Prednisone could certainly be a contributory factor. He is going to call prescribing MD to discuss further. He will let us know decision. Advised to call/contact us if issues persist. Patient verbalized understanding and agreeable to plan.

## 2021-04-02 NOTE — Progress Notes (Signed)
Remote pacemaker transmission.   

## 2021-04-14 ENCOUNTER — Inpatient Hospital Stay: Payer: Medicare Other

## 2021-04-14 ENCOUNTER — Other Ambulatory Visit: Payer: Self-pay

## 2021-04-14 ENCOUNTER — Encounter: Payer: Self-pay | Admitting: Hematology and Oncology

## 2021-04-14 ENCOUNTER — Inpatient Hospital Stay: Payer: Medicare Other | Attending: Hematology and Oncology | Admitting: Hematology and Oncology

## 2021-04-14 VITALS — BP 129/60 | HR 64 | Temp 97.9°F | Resp 18 | Ht 69.0 in | Wt 190.6 lb

## 2021-04-14 DIAGNOSIS — M7918 Myalgia, other site: Secondary | ICD-10-CM | POA: Diagnosis not present

## 2021-04-14 DIAGNOSIS — C186 Malignant neoplasm of descending colon: Secondary | ICD-10-CM | POA: Diagnosis not present

## 2021-04-14 DIAGNOSIS — Z85038 Personal history of other malignant neoplasm of large intestine: Secondary | ICD-10-CM | POA: Insufficient documentation

## 2021-04-14 DIAGNOSIS — I4891 Unspecified atrial fibrillation: Secondary | ICD-10-CM | POA: Diagnosis not present

## 2021-04-14 DIAGNOSIS — D472 Monoclonal gammopathy: Secondary | ICD-10-CM

## 2021-04-14 DIAGNOSIS — D500 Iron deficiency anemia secondary to blood loss (chronic): Secondary | ICD-10-CM | POA: Diagnosis not present

## 2021-04-14 DIAGNOSIS — D508 Other iron deficiency anemias: Secondary | ICD-10-CM

## 2021-04-14 DIAGNOSIS — G6181 Chronic inflammatory demyelinating polyneuritis: Secondary | ICD-10-CM | POA: Diagnosis not present

## 2021-04-14 DIAGNOSIS — D869 Sarcoidosis, unspecified: Secondary | ICD-10-CM | POA: Insufficient documentation

## 2021-04-14 DIAGNOSIS — R5383 Other fatigue: Secondary | ICD-10-CM | POA: Diagnosis not present

## 2021-04-14 LAB — CBC WITH DIFFERENTIAL/PLATELET
Abs Immature Granulocytes: 0.01 10*3/uL (ref 0.00–0.07)
Basophils Absolute: 0 10*3/uL (ref 0.0–0.1)
Basophils Relative: 1 %
Eosinophils Absolute: 0.2 10*3/uL (ref 0.0–0.5)
Eosinophils Relative: 5 %
HCT: 38 % — ABNORMAL LOW (ref 39.0–52.0)
Hemoglobin: 12.3 g/dL — ABNORMAL LOW (ref 13.0–17.0)
Immature Granulocytes: 0 %
Lymphocytes Relative: 24 %
Lymphs Abs: 0.8 10*3/uL (ref 0.7–4.0)
MCH: 28.1 pg (ref 26.0–34.0)
MCHC: 32.4 g/dL (ref 30.0–36.0)
MCV: 86.8 fL (ref 80.0–100.0)
Monocytes Absolute: 0.4 10*3/uL (ref 0.1–1.0)
Monocytes Relative: 11 %
Neutro Abs: 1.9 10*3/uL (ref 1.7–7.7)
Neutrophils Relative %: 59 %
Platelets: 164 10*3/uL (ref 150–400)
RBC: 4.38 MIL/uL (ref 4.22–5.81)
RDW: 14.6 % (ref 11.5–15.5)
WBC: 3.2 10*3/uL — ABNORMAL LOW (ref 4.0–10.5)
nRBC: 0 % (ref 0.0–0.2)

## 2021-04-14 LAB — IRON AND TIBC
Iron: 48 ug/dL (ref 42–163)
Saturation Ratios: 10 % — ABNORMAL LOW (ref 20–55)
TIBC: 486 ug/dL — ABNORMAL HIGH (ref 202–409)
UIBC: 439 ug/dL — ABNORMAL HIGH (ref 117–376)

## 2021-04-14 LAB — FERRITIN: Ferritin: 12 ng/mL — ABNORMAL LOW (ref 24–336)

## 2021-04-14 NOTE — Progress Notes (Signed)
Derrick [name] OFFICE PROGRESS NOTE  Patient Care Team: [name], MD as PCP - General (Family Medicine) [name], MD as PCP - Electrophysiology (Cardiology) [name], MD as PCP - Cardiology (Cardiology) Heath Lark, MD as Consulting Physician (Hematology and Oncology) Debbora Dus, [facility] as Pharmacist (Pharmacist)  ASSESSMENT & PLAN:  Malignant neoplasm of descending colon [facility]) I have reviewed his last EGD and colonoscopy He has no signs of cancer recurrence  Iron deficiency anemia The cause of his iron deficiency anemia is likely due to slow GI bleed that may not be detectable on endoscopy evaluation Continue conservative approach with IV iron as needed The most likely cause of his anemia is due to chronic blood loss/malabsorption syndrome. We discussed some of the risks, benefits, and alternatives of intravenous iron infusions. The patient is symptomatic from anemia and the iron level is critically low. He tolerated oral iron supplement poorly and desires to achieved higher levels of iron faster for adequate hematopoesis. Some of the side-effects to be expected including risks of infusion reactions, phlebitis, headaches, nausea and fatigue.  The patient is willing to proceed. Patient education material was dispensed.  Goal is to keep ferritin level greater than 50 and resolution of anemia    MGUS (monoclonal gammopathy of unknown significance) His last light chain level is borderline abnormal I do not believe this is the cause of his diffuse musculoskeletal pain I plan to recheck MGUS panel again next   No orders of the defined types were placed in this encounter.   All questions were answered. The patient knows to call the clinic with any problems, questions or concerns. The total time spent in the appointment was 20 minutes encounter with patients including review of chart and various tests results, discussions about plan of care and  coordination of care plan   Heath Lark, MD 04/14/2021 10:57 AM  INTERVAL HISTORY: Please see below for problem oriented charting. He returns for further follow-up on iron deficiency anemia and MGUS He have diffuse musculoskeletal pain and is following with rheumatologist He did not tolerate prednisone therapy The patient denies any recent signs or symptoms of bleeding such as spontaneous epistaxis, hematuria or hematochezia. He complained of excessive fatigue  SUMMARY OF ONCOLOGIC HISTORY:  Karla Vines was transferred to my care after his prior physician has left.  I reviewed the patient's records extensive and collaborated the history with the patient. 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 He was initially evaluated and treated by a hematologist and a neurologist in [place] before he moved to [place]. 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 He had bone marrow aspirate and biopsy which showed no evidence of malignancy in his bone marrow. His neurologic condition is characterized primarily by painful distal neuropathy, feet worse than hands, and lower extremity weakness. At one point he could barely walk a block without having to stop due to pain and weakness. He was given an initial trial of Rituxan in 2010 with no significant improvement. However following reinitiation of Rituxan in April 2012 he did get a progressive improvement. He was put on a maintenance program which we have continued in this office on an every other month basis through 03/23/2013.  The patient have new onset atrial fibrillation. His echocardiogram shows significant dilated atrium  without evidence of increase ventricular wall thickness. 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 Repeat  bone marrow aspirate and biopsy on 08/04/2018 was negative for malignancy He had CT imaging study of the abdomen and pelvis in 2019 and recent CT scan of the chest in May 2020 which show no evidence of lymphoma In February 2021, he received intravenous iron infusion prior to his knee surgery He was referred here for MGUS and being monitored Patient also have remote history of colon cancer status post surgery with no evidence of disease He had repeat EGD and colonoscopy in 2021 without signs of recurrence  REVIEW OF SYSTEMS:   Constitutional: Denies fevers, chills or abnormal weight loss Eyes: Denies blurriness of vision Ears, nose, mouth, throat, and face: Denies mucositis or sore throat Respiratory: Denies cough, dyspnea or wheezes Cardiovascular: Denies palpitation, chest discomfort or lower extremity swelling Gastrointestinal:  Denies nausea, heartburn or change in bowel habits Skin: Denies abnormal skin rashes Lymphatics: Denies new lymphadenopathy or easy bruising Neurological:Denies numbness, tingling or new weaknesses Behavioral/Psych: Mood is stable, no new changes  All other systems were reviewed with the patient and are negative.  I have reviewed the past medical history, past surgical history, social history and family history with the patient and they are unchanged from previous note.  ALLERGIES:  is allergic to miralax [polyethylene glycol] and nsaids.  MEDICATIONS:  Current Outpatient Medications  Medication Sig Dispense Refill  . albuterol (VENTOLIN HFA) 108 (90 Base) MCG/ACT inhaler Inhale 2 puffs into the lungs every 6 (six) hours as needed for wheezing or shortness of breath. 8 g 2  . amLODipine (NORVASC) 5 MG tablet Take 1 tablet by mouth once daily 90 tablet 1  . bimatoprost (LUMIGAN) 0.01 % SOLN Place 1 drop into both eyes at bedtime.    . Cholecalciferol 25 MCG (1000 UT) tablet Take 1,000 Units by mouth daily.     . dorzolamide-timolol (COSOPT) 22.3-6.8 MG/ML  ophthalmic solution Place 1 drop into both eyes 2 (two) times daily.     [name] dronedarone (MULTAQ) 400 MG tablet TAKE 1 TABLET BY MOUTH TWICE DAILY WITH MEALS 180 tablet 2  . ELIQUIS 5 MG TABS tablet Take 1 tablet by mouth twice daily 180 tablet 2  . finasteride (PROSCAR) 5 MG tablet Take 5 mg by mouth daily after breakfast.     . fluticasone (FLONASE) 50 MCG/ACT nasal spray Place 2 sprays into both nostrils 2 (two) times daily.    . Fluticasone Propionate, Inhal, (FLOVENT DISKUS) 100 MCG/BLIST AEPB Inhale 1 Inhaler into the lungs 2 (two) times daily. 60 each 2  . metoprolol succinate (TOPROL XL) 25 MG 24 hr tablet Take 1 tablet (25 mg total) by mouth at bedtime. 90 tablet 2  . Multiple Vitamin (MULTIVITAMIN WITH MINERALS) TABS tablet Take 1 tablet by mouth daily.    . NONFORMULARY OR COMPOUNDED ITEM Neuropathy cream transdermal therapeutics    . pantoprazole (PROTONIX) 40 MG tablet Take 1 tablet by mouth once daily 90 tablet 2  . Polyethyl Glycol-Propyl Glycol (SYSTANE OP) Place 1 drop into both eyes daily as needed (dry eyes).    . rosuvastatin (CRESTOR) 20 MG tablet Take 1 tablet by mouth once daily 90 tablet 1  . tadalafil (CIALIS) 5 MG tablet Take 5 mg by mouth at bedtime.     . triamterene-hydrochlorothiazide (MAXZIDE-25) 37.5-25 MG tablet Take 1 tablet by mouth once daily 90 tablet 1   No current facility-administered medications for this visit.    PHYSICAL  EXAMINATION: ECOG PERFORMANCE STATUS: 1 - Symptomatic but completely ambulatory  Vitals:   04/14/21 0804  BP: 129/60  Pulse: 64  Resp: 18  Temp: 97.9 F (36.6 C)  SpO2: 100%   Filed Weights   04/14/21 0804  Weight: 190 lb 9.6 oz (86.5 kg)    GENERAL:alert, no distress and comfortable NEURO: alert & oriented x 3 with fluent speech, no focal motor/sensory deficits  LABORATORY DATA:  I have reviewed the data as listed    Component Value Date/Time   NA 137 10/22/2020 0823   NA 141 05/29/2018 0806   NA 141 07/27/2016  0748   K 4.0 10/22/2020 0823   K 3.9 07/27/2016 0748   CL 101 10/22/2020 0823   CL 104 03/23/2013 0855   CO2 29 10/22/2020 0823   CO2 30 (H) 07/27/2016 0748   GLUCOSE 93 10/22/2020 0823   GLUCOSE 122 07/27/2016 0748   GLUCOSE 98 03/23/2013 0855   BUN 15 10/22/2020 0823   BUN 14 05/29/2018 0806   BUN 16.0 07/27/2016 0748   CREATININE 1.01 10/22/2020 0823   CREATININE 1.03 02/01/2020 1520   CREATININE 1.0 07/27/2016 0748   CALCIUM 10.3 10/22/2020 0823   CALCIUM 10.5 (H) 07/27/2016 0748   PROT 6.8 10/22/2020 0823   PROT 6.6 05/21/2020 0721   PROT 7.5 07/27/2016 0748   ALBUMIN 4.3 10/22/2020 0823   ALBUMIN 4.3 05/21/2020 0721   ALBUMIN 4.3 07/27/2016 0748   AST 19 10/22/2020 0823   AST 24 07/27/2016 0748   ALT 12 10/22/2020 0823   ALT 19 07/27/2016 0748   ALKPHOS 49 10/22/2020 0823   ALKPHOS 53 07/27/2016 0748   BILITOT 0.9 10/22/2020 0823   BILITOT 0.4 05/21/2020 0721   BILITOT 0.84 07/27/2016 0748   GFRNONAA >60 04/14/2020 1221   GFRAA >60 04/14/2020 1221    No results found for: SPEP, UPEP  Lab Results  Component Value Date   WBC 3.2 (L) 04/14/2021   NEUTROABS 1.9 04/14/2021   HGB 12.3 (L) 04/14/2021   HCT 38.0 (L) 04/14/2021   MCV 86.8 04/14/2021   PLT 164 04/14/2021      Chemistry      Component Value Date/Time   NA 137 10/22/2020 0823   NA 141 05/29/2018 0806   NA 141 07/27/2016 0748   K 4.0 10/22/2020 0823   K 3.9 07/27/2016 0748   CL 101 10/22/2020 0823   CL 104 03/23/2013 0855   CO2 29 10/22/2020 0823   CO2 30 (H) 07/27/2016 0748   BUN 15 10/22/2020 0823   BUN 14 05/29/2018 0806   BUN 16.0 07/27/2016 0748   CREATININE 1.01 10/22/2020 0823   CREATININE 1.03 02/01/2020 1520   CREATININE 1.0 07/27/2016 0748      Component Value Date/Time   CALCIUM 10.3 10/22/2020 0823   CALCIUM 10.5 (H) 07/27/2016 0748   ALKPHOS 49 10/22/2020 0823   ALKPHOS 53 07/27/2016 0748   AST 19 10/22/2020 0823   AST 24 07/27/2016 0748   ALT 12 10/22/2020 0823   ALT  19 07/27/2016 0748   BILITOT 0.9 10/22/2020 0823   BILITOT 0.4 05/21/2020 0721   BILITOT 0.84 07/27/2016 0748

## 2021-04-14 NOTE — Assessment & Plan Note (Signed)
The cause of his iron deficiency anemia is likely due to slow GI bleed that may not be detectable on endoscopy evaluation Continue conservative approach with IV iron as needed The most likely cause of his anemia is due to chronic blood loss/malabsorption syndrome. We discussed some of the risks, benefits, and alternatives of intravenous iron infusions. The patient is symptomatic from anemia and the iron level is critically low. He tolerated oral iron supplement poorly and desires to achieved higher levels of iron faster for adequate hematopoesis. Some of the side-effects to be expected including risks of infusion reactions, phlebitis, headaches, nausea and fatigue.  The patient is willing to proceed. Patient education material was dispensed.  Goal is to keep ferritin level greater than 50 and resolution of anemia

## 2021-04-14 NOTE — Assessment & Plan Note (Signed)
I have reviewed his last EGD and colonoscopy He has no signs of cancer recurrence

## 2021-04-14 NOTE — Assessment & Plan Note (Signed)
His last light chain level is borderline abnormal I do not believe this is the cause of his diffuse musculoskeletal pain I plan to recheck MGUS panel again next

## 2021-04-15 ENCOUNTER — Telehealth: Payer: Self-pay | Admitting: Hematology and Oncology

## 2021-04-15 NOTE — Telephone Encounter (Signed)
Scheduled appts per 4/19 sch msg. Called pt, no answer and no voicemail available. Mailed updated calendar to pt.

## 2021-04-17 ENCOUNTER — Encounter: Payer: Self-pay | Admitting: Hematology and Oncology

## 2021-04-17 DIAGNOSIS — Z23 Encounter for immunization: Secondary | ICD-10-CM | POA: Diagnosis not present

## 2021-04-20 ENCOUNTER — Other Ambulatory Visit: Payer: Self-pay | Admitting: Hematology and Oncology

## 2021-04-21 ENCOUNTER — Telehealth: Payer: Self-pay | Admitting: Hematology and Oncology

## 2021-04-21 NOTE — Telephone Encounter (Signed)
Scheduled appts per 4/25 sch msg. Pt aware.  

## 2021-04-23 DIAGNOSIS — M2550 Pain in unspecified joint: Secondary | ICD-10-CM | POA: Diagnosis not present

## 2021-04-23 DIAGNOSIS — G6181 Chronic inflammatory demyelinating polyneuritis: Secondary | ICD-10-CM | POA: Diagnosis not present

## 2021-04-23 DIAGNOSIS — Z6827 Body mass index (BMI) 27.0-27.9, adult: Secondary | ICD-10-CM | POA: Diagnosis not present

## 2021-04-23 DIAGNOSIS — E663 Overweight: Secondary | ICD-10-CM | POA: Diagnosis not present

## 2021-04-23 DIAGNOSIS — M150 Primary generalized (osteo)arthritis: Secondary | ICD-10-CM | POA: Diagnosis not present

## 2021-04-23 DIAGNOSIS — D860 Sarcoidosis of lung: Secondary | ICD-10-CM | POA: Diagnosis not present

## 2021-04-23 DIAGNOSIS — D472 Monoclonal gammopathy: Secondary | ICD-10-CM | POA: Diagnosis not present

## 2021-04-23 DIAGNOSIS — M5489 Other dorsalgia: Secondary | ICD-10-CM | POA: Diagnosis not present

## 2021-04-23 DIAGNOSIS — M25561 Pain in right knee: Secondary | ICD-10-CM | POA: Diagnosis not present

## 2021-05-08 ENCOUNTER — Other Ambulatory Visit: Payer: Self-pay

## 2021-05-08 ENCOUNTER — Other Ambulatory Visit: Payer: Medicare Other

## 2021-05-08 ENCOUNTER — Inpatient Hospital Stay: Payer: Medicare Other | Attending: Hematology and Oncology

## 2021-05-08 VITALS — BP 133/67 | HR 60 | Temp 98.3°F | Resp 18

## 2021-05-08 DIAGNOSIS — D472 Monoclonal gammopathy: Secondary | ICD-10-CM | POA: Insufficient documentation

## 2021-05-08 DIAGNOSIS — G6181 Chronic inflammatory demyelinating polyneuritis: Secondary | ICD-10-CM | POA: Diagnosis not present

## 2021-05-08 DIAGNOSIS — D509 Iron deficiency anemia, unspecified: Secondary | ICD-10-CM

## 2021-05-08 DIAGNOSIS — D508 Other iron deficiency anemias: Secondary | ICD-10-CM

## 2021-05-08 DIAGNOSIS — R5383 Other fatigue: Secondary | ICD-10-CM | POA: Insufficient documentation

## 2021-05-08 DIAGNOSIS — D869 Sarcoidosis, unspecified: Secondary | ICD-10-CM | POA: Insufficient documentation

## 2021-05-08 DIAGNOSIS — Z85038 Personal history of other malignant neoplasm of large intestine: Secondary | ICD-10-CM | POA: Insufficient documentation

## 2021-05-08 DIAGNOSIS — D539 Nutritional anemia, unspecified: Secondary | ICD-10-CM

## 2021-05-08 DIAGNOSIS — I4891 Unspecified atrial fibrillation: Secondary | ICD-10-CM | POA: Diagnosis not present

## 2021-05-08 DIAGNOSIS — M7918 Myalgia, other site: Secondary | ICD-10-CM | POA: Diagnosis not present

## 2021-05-08 DIAGNOSIS — D500 Iron deficiency anemia secondary to blood loss (chronic): Secondary | ICD-10-CM | POA: Diagnosis not present

## 2021-05-08 MED ORDER — SODIUM CHLORIDE 0.9 % IV SOLN
Freq: Once | INTRAVENOUS | Status: AC
  Filled 2021-05-08: qty 250

## 2021-05-08 MED ORDER — SODIUM CHLORIDE 0.9 % IV SOLN
510.0000 mg | Freq: Once | INTRAVENOUS | Status: AC
  Administered 2021-05-08: 510 mg via INTRAVENOUS
  Filled 2021-05-08: qty 510

## 2021-05-08 NOTE — Patient Instructions (Signed)
Ferumoxytol injection What is this medicine? FERUMOXYTOL is an iron complex. Iron is used to make healthy red blood cells, which carry oxygen and nutrients throughout the body. This medicine is used to treat iron deficiency anemia. This medicine may be used for other purposes; ask your health care provider or pharmacist if you have questions. COMMON BRAND NAME(S): Feraheme What should I tell my health care provider before I take this medicine? They need to know if you have any of these conditions:  anemia not caused by low iron levels  high levels of iron in the blood  magnetic resonance imaging (MRI) test scheduled  an unusual or allergic reaction to iron, other medicines, foods, dyes, or preservatives  pregnant or trying to get pregnant  breast-feeding How should I use this medicine? This medicine is for injection into a vein. It is given by a health care professional in a hospital or clinic setting. Talk to your pediatrician regarding the use of this medicine in children. Special care may be needed. Overdosage: If you think you have taken too much of this medicine contact a poison control center or emergency room at once. NOTE: This medicine is only for you. Do not share this medicine with others. What if I miss a dose? It is important not to miss your dose. Call your doctor or health care professional if you are unable to keep an appointment. What may interact with this medicine? This medicine may interact with the following medications:  other iron products This list may not describe all possible interactions. Give your health care provider a list of all the medicines, herbs, non-prescription drugs, or dietary supplements you use. Also tell them if you smoke, drink alcohol, or use illegal drugs. Some items may interact with your medicine. What should I watch for while using this medicine? Visit your doctor or healthcare professional regularly. Tell your doctor or healthcare  professional if your symptoms do not start to get better or if they get worse. You may need blood work done while you are taking this medicine. You may need to follow a special diet. Talk to your doctor. Foods that contain iron include: whole grains/cereals, dried fruits, beans, or peas, leafy green vegetables, and organ meats (liver, kidney). What side effects may I notice from receiving this medicine? Side effects that you should report to your doctor or health care professional as soon as possible:  allergic reactions like skin rash, itching or hives, swelling of the face, lips, or tongue  breathing problems  changes in blood pressure  feeling faint or lightheaded, falls  fever or chills  flushing, sweating, or hot feelings  swelling of the ankles or feet Side effects that usually do not require medical attention (report to your doctor or health care professional if they continue or are bothersome):  diarrhea  headache  nausea, vomiting  stomach pain This list may not describe all possible side effects. Call your doctor for medical advice about side effects. You may report side effects to FDA at 1-800-FDA-1088. Where should I keep my medicine? This drug is given in a hospital or clinic and will not be stored at home. NOTE: This sheet is a summary. It may not cover all possible information. If you have questions about this medicine, talk to your doctor, pharmacist, or health care provider.  2021 Elsevier/Gold Standard (2017-01-31 20:21:10)  

## 2021-05-12 ENCOUNTER — Other Ambulatory Visit (INDEPENDENT_AMBULATORY_CARE_PROVIDER_SITE_OTHER): Payer: Medicare Other

## 2021-05-12 ENCOUNTER — Other Ambulatory Visit: Payer: Self-pay

## 2021-05-12 DIAGNOSIS — D509 Iron deficiency anemia, unspecified: Secondary | ICD-10-CM

## 2021-05-12 LAB — HEMOCCULT SLIDES (X 3 CARDS)
Fecal Occult Blood: NEGATIVE
OCCULT 1: NEGATIVE
OCCULT 2: NEGATIVE
OCCULT 3: NEGATIVE
OCCULT 4: NEGATIVE
OCCULT 5: NEGATIVE

## 2021-05-12 NOTE — Progress Notes (Signed)
fe 

## 2021-05-15 ENCOUNTER — Inpatient Hospital Stay: Payer: Medicare Other

## 2021-05-15 ENCOUNTER — Other Ambulatory Visit: Payer: Self-pay

## 2021-05-15 VITALS — BP 125/60 | HR 60 | Temp 98.6°F | Resp 18

## 2021-05-15 DIAGNOSIS — D869 Sarcoidosis, unspecified: Secondary | ICD-10-CM | POA: Diagnosis not present

## 2021-05-15 DIAGNOSIS — D539 Nutritional anemia, unspecified: Secondary | ICD-10-CM

## 2021-05-15 DIAGNOSIS — D500 Iron deficiency anemia secondary to blood loss (chronic): Secondary | ICD-10-CM | POA: Diagnosis not present

## 2021-05-15 DIAGNOSIS — G6181 Chronic inflammatory demyelinating polyneuritis: Secondary | ICD-10-CM | POA: Diagnosis not present

## 2021-05-15 DIAGNOSIS — D472 Monoclonal gammopathy: Secondary | ICD-10-CM | POA: Diagnosis not present

## 2021-05-15 DIAGNOSIS — R5383 Other fatigue: Secondary | ICD-10-CM | POA: Diagnosis not present

## 2021-05-15 DIAGNOSIS — M7918 Myalgia, other site: Secondary | ICD-10-CM | POA: Diagnosis not present

## 2021-05-15 DIAGNOSIS — D508 Other iron deficiency anemias: Secondary | ICD-10-CM

## 2021-05-15 MED ORDER — SODIUM CHLORIDE 0.9 % IV SOLN
510.0000 mg | Freq: Once | INTRAVENOUS | Status: AC
  Administered 2021-05-15: 510 mg via INTRAVENOUS
  Filled 2021-05-15: qty 510

## 2021-05-15 MED ORDER — SODIUM CHLORIDE 0.9 % IV SOLN
Freq: Once | INTRAVENOUS | Status: AC
  Filled 2021-05-15: qty 250

## 2021-05-15 NOTE — Patient Instructions (Signed)
Ferumoxytol injection What is this medicine? FERUMOXYTOL is an iron complex. Iron is used to make healthy red blood cells, which carry oxygen and nutrients throughout the body. This medicine is used to treat iron deficiency anemia. This medicine may be used for other purposes; ask your health care provider or pharmacist if you have questions. COMMON BRAND NAME(S): Feraheme What should I tell my health care provider before I take this medicine? They need to know if you have any of these conditions:  anemia not caused by low iron levels  high levels of iron in the blood  magnetic resonance imaging (MRI) test scheduled  an unusual or allergic reaction to iron, other medicines, foods, dyes, or preservatives  pregnant or trying to get pregnant  breast-feeding How should I use this medicine? This medicine is for injection into a vein. It is given by a health care professional in a hospital or clinic setting. Talk to your pediatrician regarding the use of this medicine in children. Special care may be needed. Overdosage: If you think you have taken too much of this medicine contact a poison control center or emergency room at once. NOTE: This medicine is only for you. Do not share this medicine with others. What if I miss a dose? It is important not to miss your dose. Call your doctor or health care professional if you are unable to keep an appointment. What may interact with this medicine? This medicine may interact with the following medications:  other iron products This list may not describe all possible interactions. Give your health care provider a list of all the medicines, herbs, non-prescription drugs, or dietary supplements you use. Also tell them if you smoke, drink alcohol, or use illegal drugs. Some items may interact with your medicine. What should I watch for while using this medicine? Visit your doctor or healthcare professional regularly. Tell your doctor or healthcare  professional if your symptoms do not start to get better or if they get worse. You may need blood work done while you are taking this medicine. You may need to follow a special diet. Talk to your doctor. Foods that contain iron include: whole grains/cereals, dried fruits, beans, or peas, leafy green vegetables, and organ meats (liver, kidney). What side effects may I notice from receiving this medicine? Side effects that you should report to your doctor or health care professional as soon as possible:  allergic reactions like skin rash, itching or hives, swelling of the face, lips, or tongue  breathing problems  changes in blood pressure  feeling faint or lightheaded, falls  fever or chills  flushing, sweating, or hot feelings  swelling of the ankles or feet Side effects that usually do not require medical attention (report to your doctor or health care professional if they continue or are bothersome):  diarrhea  headache  nausea, vomiting  stomach pain This list may not describe all possible side effects. Call your doctor for medical advice about side effects. You may report side effects to FDA at 1-800-FDA-1088. Where should I keep my medicine? This drug is given in a hospital or clinic and will not be stored at home. NOTE: This sheet is a summary. It may not cover all possible information. If you have questions about this medicine, talk to your doctor, pharmacist, or health care provider.  2021 Elsevier/Gold Standard (2017-01-31 20:21:10)  

## 2021-05-15 NOTE — Progress Notes (Signed)
Patient was observed for 30 minutes post iron infusion with no adverse reactions. Upon leaving infusion room, patient was in no distress and vitals stable.

## 2021-06-10 ENCOUNTER — Encounter: Payer: Self-pay | Admitting: Cardiovascular Disease

## 2021-06-10 ENCOUNTER — Other Ambulatory Visit: Payer: Self-pay

## 2021-06-10 ENCOUNTER — Ambulatory Visit (HOSPITAL_COMMUNITY): Payer: Medicare Other | Attending: Internal Medicine

## 2021-06-10 ENCOUNTER — Ambulatory Visit (INDEPENDENT_AMBULATORY_CARE_PROVIDER_SITE_OTHER): Payer: Medicare Other | Admitting: Cardiovascular Disease

## 2021-06-10 VITALS — BP 144/70 | HR 60 | Ht 70.0 in | Wt 186.6 lb

## 2021-06-10 DIAGNOSIS — I350 Nonrheumatic aortic (valve) stenosis: Secondary | ICD-10-CM | POA: Insufficient documentation

## 2021-06-10 DIAGNOSIS — E782 Mixed hyperlipidemia: Secondary | ICD-10-CM | POA: Diagnosis not present

## 2021-06-10 DIAGNOSIS — I442 Atrioventricular block, complete: Secondary | ICD-10-CM | POA: Diagnosis not present

## 2021-06-10 DIAGNOSIS — I2510 Atherosclerotic heart disease of native coronary artery without angina pectoris: Secondary | ICD-10-CM | POA: Diagnosis not present

## 2021-06-10 DIAGNOSIS — I480 Paroxysmal atrial fibrillation: Secondary | ICD-10-CM

## 2021-06-10 LAB — ECHOCARDIOGRAM COMPLETE
AR max vel: 1.88 cm2
AV Area VTI: 1.82 cm2
AV Area mean vel: 1.64 cm2
AV Mean grad: 25 mmHg
AV Peak grad: 40.4 mmHg
Ao pk vel: 3.18 m/s
Area-P 1/2: 4.15 cm2
S' Lateral: 3.8 cm

## 2021-06-10 MED ORDER — PERFLUTREN LIPID MICROSPHERE
1.0000 mL | INTRAVENOUS | Status: AC | PRN
  Administered 2021-06-10: 1 mL via INTRAVENOUS

## 2021-06-10 NOTE — Patient Instructions (Signed)
Medication Instructions:  Your provider recommends that you continue on your current medications as directed. Please refer to the Current Medication list given to you today.   *If you need a refill on your cardiac medications before your next appointment, please call your pharmacy*   Follow-Up: You will be called to arrange your 1 year echo and office visit. 

## 2021-06-10 NOTE — Progress Notes (Signed)
Cardiology Office Note:    Date:  06/10/2021   ID:  Derrick [name] 11-23-1941, MRN [id number]  PCP:  [name], MD   [facility] Providers Cardiologist:  [name], MD Electrophysiologist:  [name], MD     Referring MD: [name], MD   Chief Complaint  Patient presents with   Coronary Artery Disease     History of Present Illness:    Derrick [name] is a 80 y.o. 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 He is doing well from a cardiac perspective. Today, he denies symptoms of palpitations, chest pain, shortness of breath, orthopnea, PND, lower extremity edema, dizziness, or syncope. He is walking with his wife on a regular basis for exercise and denies exertional symptoms. He is still able to do yard work. His primary limitation is related to leg and back pain and weakness.   Past Medical History:  Diagnosis Date   Anemia    iron deficiency anemia- infusion on 02/15/2020 and 02/22/2020   BENIGN PROSTATIC HYPERTROPHY, WITH OBSTRUCTION 05/28/2010   CAD (coronary artery disease)    a. s/p NSTEMI 06/01/11: DES to RCA;  b. cath 06/25/11:   dLM 50-60% (FFR 0.87), prox to mid LAD 40-50%, D1 50%, pCFX 50%, RCA stent ok, dPDA 80%, EF 55-60%.  His FFR was felt to be negative and therefore medical therapy was recommended ;  echo 6/12: EF 55-60%, mild AS    Chronic back pain    CIDP (chronic inflammatory demyelinating polyneuropathy) ([place]) 04/11/2012   Colon cancer ([place]) dx'd 2000   "left"   COLONIC POLYPS, ADENOMATOUS, HX OF 0/04/3975   Complication of anesthesia    "stopped  breathing; related to my sleep apnea" & urinary retention    COPD (chronic obstructive pulmonary disease) ([place])    "associated w/lung sarcoidosis"   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/16/2014   DISH (diffuse idiopathic skeletal hyperostosis) 12/02/2015   Diverticulitis    GENERALIZED OSTEOARTHROSIS UNSPECIFIED SITE 05/28/2010   GERD 05/28/2010   Heart murmur    History of gout    History of kidney stones    HYPERLIPIDEMIA 05/28/2010   Hypertension    IgM lambda paraproteinemia    Intrinsic asthma, 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   "associated w/lung sarcoidosis"   Malignant neoplasm of descending colon ([place]) 05/28/2010   MITRAL VALVE PROLAPSE 05/28/2010   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   NSTEMI (non-ST elevated myocardial infarction) ([place]) 06/03/11   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   OSA on CPAP 05/28/2010   PALPITATIONS, CHRONIC 05/28/2010   Parotid gland pain 2010   infection   Peripheral neuropathy    "tx'd w/targeted chemo" (05/12/2016)   Pneumonia 2-3 times   Presence of permanent cardiac pacemaker    Medtronic   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   Sarcoidosis    pulmonalis   UNSPECIFIED INFLAMMATORY AND TOXIC NEUROPATHY 05/28/2010   Vision loss    VITAMIN D DEFICIENCY 05/28/2010    Past Surgical History:  Procedure Laterality Date   APPENDECTOMY  1954   age 19   CATARACT EXTRACTION W/ INTRAOCULAR LENS IMPLANT Left    COLON SURGERY  2000   decending  colon    CORONARY ANGIOPLASTY WITH STENT PLACEMENT  06/03/11   CORONARY ARTERY BYPASS GRAFT N/A 06/05/2019   Procedure: CORONARY ARTERY BYPASS GRAFTING (CABG) TIMES THREE USING LEFT INTERNAL MAMMARY ARTERY AND RIGHT GREATER SEPHANOUS VEIN;  Surgeon: [name], MD;  Location: [place];  Service: Open Heart Surgery;  Laterality: N/A;   ELECTROPHYSIOLOGIC STUDY N/A 05/12/2016   Procedure: Cardioversion;  Surgeon: [name], MD;  Location: [place] [facility];  Service: Cardiovascular;  Laterality: N/A;   EP  IMPLANTABLE DEVICE N/A 05/12/2016   Procedure: Pacemaker Implant;  Surgeon: [name], MD;  Location: [place] [facility];  Service: Cardiovascular;  Laterality: N/A;   EYE SURGERY     bilateral cataract with lens implant   HERNIA REPAIR  01/2019   left inguinal hernia repair   INGUINAL HERNIA REPAIR Left 02/21/2019   Procedure: LEFT INGUINAL HERNIA REPAIR WITH MESH;  Surgeon: [name], MD;  Location: [place];  Service: General;  Laterality: Left;   INSERT / REPLACE / REMOVE PACEMAKER  05/12/2016   INTRAVASCULAR PRESSURE WIRE/FFR STUDY N/A 05/15/2019   Procedure: INTRAVASCULAR PRESSURE WIRE/FFR STUDY;  Surgeon: [name], MD;  Location: [place] [facility];  Service: Cardiovascular;  Laterality: N/A;   KNEE ARTHROSCOPY Right 2003   LEFT HEART CATH AND CORONARY ANGIOGRAPHY N/A 05/15/2019   Procedure: LEFT HEART CATH AND CORONARY ANGIOGRAPHY;  Surgeon: [name], MD;  Location: [place] [facility];  Service: Cardiovascular;  Laterality: N/A;   LEFT HEART CATHETERIZATION WITH CORONARY ANGIOGRAM N/A 03/31/2015   Procedure: LEFT HEART CATHETERIZATION WITH CORONARY ANGIOGRAM;  Surgeon: [name], MD;  Location: [facility];  Service: Cardiovascular;  Laterality: N/A;   LUNG SURGERY  2001   "open lung dissection"   MOHS SURGERY Left ~ 2008   ear   PROSTATE BIOPSY  2010   TEE WITHOUT CARDIOVERSION N/A 06/05/2019   Procedure: TRANSESOPHAGEAL ECHOCARDIOGRAM (TEE);  Surgeon: [name], [name], MD;  Location: [place];  Service: Open Heart Surgery;  Laterality: N/A;   TONSILLECTOMY  1950   TOTAL KNEE ARTHROPLASTY Right 03/11/2020   Procedure: TOTAL KNEE ARTHROPLASTY;  Surgeon: [name], MD;  Location: [facility];  Service: Orthopedics;  Laterality: Right;  70 mins    Current Medications: Current Meds  Medication Sig   albuterol (VENTOLIN HFA) 108 (90 Base) MCG/ACT inhaler Inhale 2 puffs into the lungs every 6 (six) hours as needed for wheezing or shortness of breath.   amLODipine  (NORVASC) 5 MG tablet Take 1 tablet by mouth once daily   bimatoprost (LUMIGAN) 0.01 % SOLN Place 1 drop into both eyes at bedtime.   Cholecalciferol 25 MCG (1000 UT) tablet Take 1,000 Units by mouth daily.    dorzolamide-timolol (COSOPT) 22.3-6.8 MG/ML ophthalmic solution Place 1 drop into both eyes 2 (two) times daily.    dronedarone (MULTAQ) 400 MG tablet TAKE 1 TABLET BY MOUTH TWICE DAILY WITH MEALS   ELIQUIS 5 MG TABS tablet Take 1 tablet by mouth twice daily   finasteride (PROSCAR) 5 MG tablet Take 5 mg by mouth daily after breakfast.    fluticasone (FLONASE) 50 MCG/ACT nasal spray Place 2 sprays into both nostrils 2 (two) times daily.   Fluticasone Propionate, Inhal, (FLOVENT DISKUS) 100 MCG/BLIST AEPB Inhale 1 Inhaler into the lungs 2 (two) times daily.   metoprolol succinate (TOPROL XL) 25 MG 24 hr tablet Take 1 tablet (25 mg total) by mouth at bedtime.   Multiple Vitamin (MULTIVITAMIN WITH MINERALS) TABS  tablet Take 1 tablet by mouth daily.   NONFORMULARY OR COMPOUNDED ITEM Neuropathy cream transdermal therapeutics   pantoprazole (PROTONIX) 40 MG tablet Take 1 tablet by mouth once daily   Polyethyl Glycol-Propyl Glycol (SYSTANE OP) Place 1 drop into both eyes daily as needed (dry eyes).   rosuvastatin (CRESTOR) 20 MG tablet Take 1 tablet by mouth once daily   tadalafil (CIALIS) 5 MG tablet Take 5 mg by mouth at bedtime.    triamterene-hydrochlorothiazide (MAXZIDE-25) 37.5-25 MG tablet Take 1 tablet by mouth once daily     Allergies:   Miralax [polyethylene glycol] and Nsaids   Social History   Socioeconomic History   Marital status: Married    Spouse name: Not on file   Number of children: 2   Years of education: MS-Coll.   Highest education level: Not on file  Occupational History   Occupation: Consultant    Comment: Building services engineer  Tobacco Use   Smoking status: Never   Smokeless tobacco: Never  Vaping Use   Vaping Use: Never used  Substance and Sexual  Activity   Alcohol use: Yes    Comment: occasional   Drug use: No   Sexual activity: Yes    Birth control/protection: None  Other Topics Concern   Not on file  Social History Narrative   College- [employer]; [employer]-environment science and mgt. Married [name])  "51" 1 son- 3; 1 dtr "63" 2 grandchildren. 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 End of life-provided discussive context and provided packet.   Social Determinants of Health   Financial Resource Strain: Low Risk    Difficulty of Paying Living Expenses: Not hard at all  Food Insecurity: No Food Insecurity   Worried About Charity fundraiser in the Last Year: Never true   [place] in the Last Year: Never true  Transportation Needs: No Transportation Needs   Lack of Transportation (Medical): No   Lack of Transportation (Non-Medical): No  Physical Activity: Sufficiently Active   Days of Exercise per Week: 7 days   Minutes of Exercise per Session: 60 min  Stress: No Stress Concern Present   Feeling of Stress : Not at all  Social Connections: Not on file     Family History: The patient's family history includes Arrhythmia (age of onset: 83) in his brother; Arthritis in his mother; Cancer in his brother; Colon polyps in his mother; Congestive Heart Failure in his brother; Coronary artery disease in his brother and mother; Heart attack in his brother; Heart disease in his mother.  ROS:   Please see the history of present illness.    All other systems reviewed and are negative.  EKGs/Labs/Other Studies Reviewed:    The following studies were reviewed today: Today's echo images are reviewed. LV function is normal. The aortic valve is moderately calcified and restricted. Peak and mean transvalvular gradients (with echo contrast) are 41 and 25 mmHg, respectively.  EKG:  EKG is not ordered today.    Recent Labs: 06/18/2020: TSH 2.06 10/22/2020: ALT 12; BUN 15; Creatinine, Ser 1.01; Potassium 4.0; Sodium  137 04/14/2021: Hemoglobin 12.3; Platelets 164  Recent Lipid Panel    Component Value Date/Time   CHOL 152 10/22/2020 0823   CHOL 147 05/21/2020 0721   TRIG 47.0 10/22/2020 0823   HDL 71.90 10/22/2020 0823   HDL 66 05/21/2020 0721   CHOLHDL 2 10/22/2020 0823   VLDL 9.4 10/22/2020 0823   LDLCALC 70 10/22/2020 0823   LDLCALC 71 05/21/2020 0721  Risk Assessment/Calculations:    CHA2DS2-VASc Score = 4  This indicates a 4.8% annual risk of stroke. The patient's score is based upon: CHF History: No HTN History: Yes Diabetes History: No Stroke History: No Vascular Disease History: Yes Age Score: 2 Gender Score: 0      Physical Exam:    VS:  BP (!) 144/70   Pulse 60   Ht 5\' 10"  (1.778 m)   Wt 186 lb 9.6 oz (84.6 kg)   BMI 26.77 kg/m     Wt Readings from Last 3 Encounters:  06/10/21 186 lb 9.6 oz (84.6 kg)  04/14/21 190 lb 9.6 oz (86.5 kg)  03/06/21 190 lb 12.8 oz (86.5 kg)     GEN:  Well nourished, well developed in no acute distress HEENT: Normal NECK: No JVD; No carotid bruits LYMPHATICS: No lymphadenopathy CARDIAC: RRR, 2/6 harsh systolic murmur at the RUSB, early-mid peaking RESPIRATORY:  Clear to auscultation without rales, wheezing or rhonchi  ABDOMEN: Soft, non-tender, non-distended MUSCULOSKELETAL:  No edema; No deformity  SKIN: Warm and dry NEUROLOGIC:  Alert and oriented x 3 PSYCHIATRIC:  Normal affect   ASSESSMENT:    1. Paroxysmal atrial fibrillation ([name])   2. Coronary artery disease involving native coronary artery of native heart without angina pectoris   3. Complete heart block ([place])   4. Mixed hyperlipidemia   5. Nonrheumatic aortic valve stenosis    PLAN:    In order of problems listed above:  Anticoagulated with apixaban. No overt bleeding problems. Noted to have iron deficiency, followed by hematology. Continue metoprolol succinate. No angina. On amlodipine, metoprolol succinate, statin drug.  S/P PPM, followed by Dr  Curt Bears. Treated with rosuvastatin 20 mg daily. LFT's normal, LDL 70 mg/dL. Echo images personal reviewed, show moderate AS. Reviewed cardinal symptoms. Repeat 2D echo in one year.         Medication Adjustments/Labs and Tests Ordered: Current medicines are reviewed at length with the patient today.  Concerns regarding medicines are outlined above.  No orders of the defined types were placed in this encounter.  No orders of the defined types were placed in this encounter.   Patient Instructions  Medication Instructions:  Your provider recommends that you continue on your current medications as directed. Please refer to the Current Medication list given to you today.   *If you need a refill on your cardiac medications before your next appointment, please call your pharmacy*  Follow-Up: You will be called to arrange your 1 year echo and office visit.   Signed, [name], MD  06/10/2021 1:33 PM    [facility]

## 2021-06-16 ENCOUNTER — Telehealth: Payer: Self-pay

## 2021-06-16 NOTE — Chronic Care Management (AMB) (Addendum)
Chronic Care Management Pharmacy Assistant   Name: Derrick [name]  MRN: [id number] DOB: 10/29/1941   Reason for Encounter: Disease State HTN  Recent office visits:  None in 6 months  Recent consult visits:  06/10/21 - Cardiology no  medication changes 05/15/21 - Oncology  Infusion  05/08/21 - Oncology Infusion 04/14/21 - Oncology Infusion 03/06/21 - Cardiology no medication changes 12/22/20 - Cardiology no data found  Hospital visits:  None in previous 6 months  Medications: Outpatient Encounter Medications as of 06/16/2021  Medication Sig   albuterol (VENTOLIN HFA) 108 (90 Base) MCG/ACT inhaler Inhale 2 puffs into the lungs every 6 (six) hours as needed for wheezing or shortness of breath.   amLODipine (NORVASC) 5 MG tablet Take 1 tablet by mouth once daily   bimatoprost (LUMIGAN) 0.01 % SOLN Place 1 drop into both eyes at bedtime.   Cholecalciferol 25 MCG (1000 UT) tablet Take 1,000 Units by mouth daily.    dorzolamide-timolol (COSOPT) 22.3-6.8 MG/ML ophthalmic solution Place 1 drop into both eyes 2 (two) times daily.    dronedarone (MULTAQ) 400 MG tablet TAKE 1 TABLET BY MOUTH TWICE DAILY WITH MEALS   ELIQUIS 5 MG TABS tablet Take 1 tablet by mouth twice daily   finasteride (PROSCAR) 5 MG tablet Take 5 mg by mouth daily after breakfast.    fluticasone (FLONASE) 50 MCG/ACT nasal spray Place 2 sprays into both nostrils 2 (two) times daily.   Fluticasone Propionate, Inhal, (FLOVENT DISKUS) 100 MCG/BLIST AEPB Inhale 1 Inhaler into the lungs 2 (two) times daily.   metoprolol succinate (TOPROL XL) 25 MG 24 hr tablet Take 1 tablet (25 mg total) by mouth at bedtime.   Multiple Vitamin (MULTIVITAMIN WITH MINERALS) TABS tablet Take 1 tablet by mouth daily.   NONFORMULARY OR COMPOUNDED ITEM Neuropathy cream transdermal therapeutics   pantoprazole (PROTONIX) 40 MG tablet Take 1 tablet by mouth once daily   Polyethyl Glycol-Propyl Glycol (SYSTANE OP) Place 1 drop into both eyes daily as  needed (dry eyes).   rosuvastatin (CRESTOR) 20 MG tablet Take 1 tablet by mouth once daily   tadalafil (CIALIS) 5 MG tablet Take 5 mg by mouth at bedtime.    triamterene-hydrochlorothiazide (MAXZIDE-25) 37.5-25 MG tablet Take 1 tablet by mouth once daily   No facility-administered encounter medications on file as of 06/16/2021.   Recent Office Vitals: BP Readings from Last 3 Encounters:  06/10/21 (!) 144/70  05/15/21 125/60  05/08/21 133/67   Pulse Readings from Last 3 Encounters:  06/10/21 60  05/15/21 60  05/08/21 60    Wt Readings from Last 3 Encounters:  06/10/21 186 lb 9.6 oz (84.6 kg)  04/14/21 190 lb 9.6 oz (86.5 kg)  03/06/21 190 lb 12.8 oz (86.5 kg)     Kidney Function Lab Results  Component Value Date/Time   CREATININE 1.01 10/22/2020 08:23 AM   CREATININE 0.89 06/18/2020 09:45 AM   CREATININE 1.03 02/01/2020 03:20 PM   CREATININE 0.93 11/10/2018 04:18 PM   CREATININE 1.0 07/27/2016 07:48 AM   CREATININE 1.0 10/20/2015 08:47 AM   GFR 71.71 10/22/2020 08:23 AM   GFRNONAA >60 04/14/2020 12:21 PM   GFRAA >60 04/14/2020 12:21 PM    BMP Latest Ref Rng & Units 10/22/2020 06/18/2020 04/14/2020  Glucose 70 - 99 mg/dL 93 101(H) 107(H)  BUN 6 - 23 mg/dL 15 15 17   Creatinine 0.40 - 1.50 mg/dL 1.01 0.89 0.95  BUN/Creat Ratio 6 - 22 (calc) - - -  Sodium 135 -  145 mEq/L 137 135 139  Potassium 3.5 - 5.1 mEq/L 4.0 4.1 3.9  Chloride 96 - 112 mEq/L 101 98 103  CO2 19 - 32 mEq/L 29 29 28   Calcium 8.4 - 10.5 mg/dL 10.3 10.6(H) 9.8   Current antihypertensive regimen:  Metoprolol 25mg . Take 1 tablet at bedtime Amlodipine 5mg  Take 1 tablet daily  Maxide 37.5-25mg  Take 1 tablet daily  Adherence Review: Is the patient currently on ACE/ARB medication? No Does the patient have >5 day gap between last estimated fill dates? No  Star Rating Drugs:  Medication:  Last Fill: Day Supply Rosuvastatin 20mg  06/01/21  90   Attempted contact with Derrick [name] 3 times on 06/18/21,  06/19/21, 06/23/21. Unsuccessful outreach. Will attempt contact next month.   Follow-Up:  Pharmacist Review  Derrick [name], CPP notified  Derrick [name], [place] Assistant [phone]   I have reviewed the care management and care coordination activities outlined in this encounter and I am certifying that I agree with the content of this note. No further action required.  Derrick [name], PharmD Clinical Pharmacist [facility] [phone]

## 2021-06-17 NOTE — Addendum Note (Signed)
Encounter addended by: Pricilla Larsson, [facility] on: 06/17/2021 2:24 PM  Actions taken: Order list changed

## 2021-06-22 ENCOUNTER — Ambulatory Visit (INDEPENDENT_AMBULATORY_CARE_PROVIDER_SITE_OTHER): Payer: Medicare Other

## 2021-06-22 DIAGNOSIS — I495 Sick sinus syndrome: Secondary | ICD-10-CM | POA: Diagnosis not present

## 2021-06-22 LAB — CUP PACEART REMOTE DEVICE CHECK
Battery Remaining Longevity: 45 mo
Battery Voltage: 2.98 V
Brady Statistic AP VP Percent: 96.47 %
Brady Statistic AP VS Percent: 0.01 %
Brady Statistic AS VP Percent: 3.51 %
Brady Statistic AS VS Percent: 0.01 %
Brady Statistic RA Percent Paced: 95.46 %
Brady Statistic RV Percent Paced: 99.89 %
Date Time Interrogation Session: 20220627102646
Implantable Lead Implant Date: 20170517
Implantable Lead Implant Date: 20170517
Implantable Lead Location: 753859
Implantable Lead Location: 753860
Implantable Lead Model: 5076
Implantable Lead Model: 5076
Implantable Pulse Generator Implant Date: 20170517
Lead Channel Impedance Value: 361 Ohm
Lead Channel Impedance Value: 418 Ohm
Lead Channel Impedance Value: 494 Ohm
Lead Channel Impedance Value: 608 Ohm
Lead Channel Pacing Threshold Amplitude: 0.75 V
Lead Channel Pacing Threshold Amplitude: 0.75 V
Lead Channel Pacing Threshold Pulse Width: 0.4 ms
Lead Channel Pacing Threshold Pulse Width: 0.4 ms
Lead Channel Sensing Intrinsic Amplitude: 10.25 mV
Lead Channel Sensing Intrinsic Amplitude: 10.25 mV
Lead Channel Sensing Intrinsic Amplitude: 2.125 mV
Lead Channel Sensing Intrinsic Amplitude: 2.125 mV
Lead Channel Setting Pacing Amplitude: 2 V
Lead Channel Setting Pacing Amplitude: 2.5 V
Lead Channel Setting Pacing Pulse Width: 0.4 ms
Lead Channel Setting Sensing Sensitivity: 2.8 mV

## 2021-07-09 NOTE — Progress Notes (Signed)
Remote pacemaker transmission.   

## 2021-07-22 DIAGNOSIS — U071 COVID-19: Secondary | ICD-10-CM | POA: Diagnosis not present

## 2021-08-25 ENCOUNTER — Other Ambulatory Visit: Payer: Self-pay | Admitting: Cardiovascular Disease

## 2021-08-25 ENCOUNTER — Other Ambulatory Visit: Payer: Self-pay | Admitting: Cardiology

## 2021-08-25 DIAGNOSIS — I4891 Unspecified atrial fibrillation: Secondary | ICD-10-CM

## 2021-08-26 NOTE — Telephone Encounter (Signed)
Pt last saw Dr Burt Knack 06/10/21, last labs 10/22/20 Creat 1.01, Age 80, weight 84.6, based on specified criteria pt is on appropriate dosage of Eliquis '5mg'$  BID for afib.  Will refill rx.

## 2021-09-07 DIAGNOSIS — U071 COVID-19: Secondary | ICD-10-CM | POA: Diagnosis not present

## 2021-09-07 DIAGNOSIS — Z23 Encounter for immunization: Secondary | ICD-10-CM | POA: Diagnosis not present

## 2021-09-14 ENCOUNTER — Ambulatory Visit (INDEPENDENT_AMBULATORY_CARE_PROVIDER_SITE_OTHER): Payer: Medicare Other | Admitting: Cardiology

## 2021-09-14 ENCOUNTER — Other Ambulatory Visit: Payer: Self-pay

## 2021-09-14 ENCOUNTER — Encounter: Payer: Self-pay | Admitting: Cardiology

## 2021-09-14 VITALS — BP 112/64 | HR 61 | Ht 69.0 in | Wt 188.8 lb

## 2021-09-14 DIAGNOSIS — I2510 Atherosclerotic heart disease of native coronary artery without angina pectoris: Secondary | ICD-10-CM

## 2021-09-14 DIAGNOSIS — H401112 Primary open-angle glaucoma, right eye, moderate stage: Secondary | ICD-10-CM | POA: Diagnosis not present

## 2021-09-14 DIAGNOSIS — I480 Paroxysmal atrial fibrillation: Secondary | ICD-10-CM

## 2021-09-14 DIAGNOSIS — H401122 Primary open-angle glaucoma, left eye, moderate stage: Secondary | ICD-10-CM | POA: Diagnosis not present

## 2021-09-14 DIAGNOSIS — H524 Presbyopia: Secondary | ICD-10-CM | POA: Diagnosis not present

## 2021-09-14 DIAGNOSIS — H35371 Puckering of macula, right eye: Secondary | ICD-10-CM | POA: Diagnosis not present

## 2021-09-14 NOTE — Progress Notes (Signed)
Electrophysiology Office Note   Date:  09/14/2021   ID:  Adedeji, Strosnider 05-22-1941, MRN [id number]  PCP:  [name], MD  Cardiologist:  Burt Knack Primary Electrophysiologist:  Emmi Wertheim [name], MD    No chief complaint on file.    History of Present Illness: Derrick [name] is a 80 y.o. male who presents today for electrophysiology evaluation.     He has a history of coronary artery disease status post RCA stent, CABG 20x6, PE, pulmonary sarcoidosis, and MDS.  He has sick sinus syndrome and is status post Medtronic dual-chamber pacemaker.  He also has atrial fibrillation and is on Multaq.  Today, denies symptoms of palpitations, chest pain, shortness of breath, orthopnea, PND, lower extremity edema, claudication, dizziness, presyncope, syncope, bleeding, or neurologic sequela. The patient is tolerating medications without difficulties.  Since being seen he has done well.  He has no chest pain or shortness of breath.  He gets a little bit fatigued throughout the day, though overall has no major complaints.  He is able to do all of his daily activities.  He continues to be quite active with walking, and does yard work.   Past Medical History:  Diagnosis Date   Anemia    iron deficiency anemia- infusion on 02/15/2020 and 02/22/2020   BENIGN PROSTATIC HYPERTROPHY, WITH OBSTRUCTION 05/28/2010   CAD (coronary artery disease)    a. s/p NSTEMI 06/01/11: DES to RCA;  b. cath 06/25/11:   dLM 50-60% (FFR 0.87), prox to mid LAD 40-50%, D1 50%, pCFX 50%, RCA stent ok, dPDA 80%, EF 55-60%.  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   DISH (diffuse idiopathic skeletal hyperostosis) 12/02/2015   Diverticulitis    GENERALIZED OSTEOARTHROSIS UNSPECIFIED SITE 05/28/2010   GERD 05/28/2010   Heart murmur    History of gout    History of kidney stones    HYPERLIPIDEMIA 05/28/2010   Hypertension    IgM lambda paraproteinemia    Intrinsic asthma, 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   "associated w/lung sarcoidosis"   Malignant neoplasm of descending colon ([place]) 05/28/2010   MITRAL VALVE PROLAPSE 05/28/2010   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   NSTEMI (non-ST elevated myocardial infarction) ([place]) 06/03/11   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   OSA on CPAP 05/28/2010   PALPITATIONS, CHRONIC 05/28/2010   Parotid gland pain 2010   infection   Peripheral neuropathy    "tx'd w/targeted chemo" (05/12/2016)   Pneumonia 2-3 times   Presence of permanent cardiac pacemaker    Medtronic   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   Sarcoidosis    pulmonalis   UNSPECIFIED INFLAMMATORY AND TOXIC NEUROPATHY 05/28/2010   Vision loss    VITAMIN D DEFICIENCY 05/28/2010   Past Surgical History:  Procedure Laterality Date   APPENDECTOMY  1954   age 46   CATARACT EXTRACTION W/ INTRAOCULAR LENS IMPLANT Left    COLON SURGERY  2000   decending colon    CORONARY ANGIOPLASTY WITH STENT PLACEMENT  06/03/11   CORONARY ARTERY BYPASS GRAFT N/A 06/05/2019   Procedure: CORONARY ARTERY BYPASS GRAFTING (CABG) TIMES THREE USING LEFT INTERNAL MAMMARY ARTERY AND RIGHT GREATER SEPHANOUS VEIN;  Surgeon: [name], MD;  Location: [facility];  Service: Open Heart Surgery;  Laterality: N/A;   ELECTROPHYSIOLOGIC STUDY N/A 05/12/2016   Procedure: Cardioversion;  Surgeon: Anida Deol [name], MD;  Location: [facility];  Service: Cardiovascular;  Laterality: N/A;   EP IMPLANTABLE DEVICE N/A 05/12/2016   Procedure: Pacemaker Implant;  Surgeon:  Arita Severtson [name], MD;  Location: [facility];  Service: Cardiovascular;  Laterality: N/A;   EYE SURGERY     bilateral cataract with lens implant   HERNIA REPAIR  01/2019   left inguinal hernia repair   INGUINAL HERNIA REPAIR Left 02/21/2019   Procedure: LEFT INGUINAL HERNIA REPAIR WITH MESH;  Surgeon: [name], MD;  Location: [facility];  Service: General;  Laterality: Left;   INSERT / REPLACE / REMOVE PACEMAKER  05/12/2016   INTRAVASCULAR PRESSURE WIRE/FFR STUDY N/A 05/15/2019   Procedure: INTRAVASCULAR PRESSURE WIRE/FFR STUDY;  Surgeon: [name], MD;  Location: [facility];  Service: Cardiovascular;  Laterality: N/A;   KNEE ARTHROSCOPY Right 2003   LEFT HEART CATH AND CORONARY ANGIOGRAPHY N/A 05/15/2019   Procedure: LEFT HEART CATH AND CORONARY ANGIOGRAPHY;  Surgeon: [name], MD;  Location: [facility];  Service: Cardiovascular;  Laterality: N/A;   LEFT HEART CATHETERIZATION WITH CORONARY ANGIOGRAM N/A 03/31/2015   Procedure: LEFT HEART CATHETERIZATION WITH CORONARY ANGIOGRAM;  Surgeon: [name], MD;  Location[facility];  Service: Cardiovascular;  Laterality: N/A;   LUNG SURGERY  2001   "open lung dissection"   MOHS SURGERY Left ~ 2008   ear   PROSTATE BIOPSY  2010   TEE WITHOUT CARDIOVERSION N/A 06/05/2019   Procedure: TRANSESOPHAGEAL ECHOCARDIOGRAM (TEE);  Surgeon: [name], [name], MD;  Location: [facility];  Service: Open Heart Surgery;  Laterality: N/A;   TONSILLECTOMY  1950   TOTAL KNEE ARTHROPLASTY Right 03/11/2020   Procedure: TOTAL KNEE ARTHROPLASTY;  Surgeon: [name], MD;  Location: [facility];  Service: Orthopedics;  Laterality: Right;  70 mins     Current Outpatient Medications  Medication Sig Dispense Refill   albuterol (VENTOLIN HFA) 108 (90 Base) MCG/ACT inhaler Inhale 2 puffs into the lungs every 6 (six) hours as needed for wheezing or shortness of breath. 8 g 2   amLODipine (NORVASC) 5 MG tablet Take 1 tablet by mouth once daily  90 tablet 2   apixaban (ELIQUIS) 5 MG TABS tablet Take 1 tablet by mouth twice daily 180 tablet 1   bimatoprost (LUMIGAN) 0.01 % SOLN Place 1 drop into both eyes at bedtime.     Cholecalciferol 25 MCG (1000 UT) tablet Take 1,000 Units by mouth daily.      dorzolamide-timolol (COSOPT) 22.3-6.8 MG/ML ophthalmic solution Place 1 drop into both eyes 2 (two) times daily.      dronedarone (MULTAQ) 400 MG tablet TAKE 1 TABLET BY MOUTH TWICE DAILY WITH MEALS 180 tablet 2   finasteride (PROSCAR) 5 MG tablet Take 5 mg by mouth daily after breakfast.      fluticasone (FLONASE) 50 MCG/ACT nasal spray Place 2 sprays into both nostrils 2 (two) times daily.     Fluticasone Propionate, Inhal, (FLOVENT DISKUS) 100 MCG/BLIST AEPB Inhale 1 Inhaler into the lungs 2 (two) times daily. 60 each 2   metoprolol succinate (TOPROL XL) 25 MG 24 hr tablet  Take 1 tablet (25 mg total) by mouth at bedtime. 90 tablet 2   Multiple Vitamin (MULTIVITAMIN WITH MINERALS) TABS tablet Take 1 tablet by mouth daily.     NONFORMULARY OR COMPOUNDED ITEM Neuropathy cream transdermal therapeutics     pantoprazole (PROTONIX) 40 MG tablet Take 1 tablet by mouth once daily 90 tablet 2   Polyethyl Glycol-Propyl Glycol (SYSTANE OP) Place 1 drop into both eyes daily as needed (dry eyes).     rosuvastatin (CRESTOR) 20 MG tablet Take 1 tablet by mouth once daily 90 tablet 2   tadalafil (CIALIS) 5 MG tablet Take 5 mg by mouth at bedtime.      triamterene-hydrochlorothiazide (MAXZIDE-25) 37.5-25 MG tablet Take 1 tablet by mouth once daily 90 tablet 2   No current facility-administered medications for this visit.    Allergies:   Miralax [polyethylene glycol] and Nsaids   Social History:  The patient  reports that he has never smoked. He has never used smokeless tobacco. He reports current alcohol use. He reports that he does not use drugs.   Family History:  The patient's family history includes Arrhythmia (age of onset: 62) in his brother;  Arthritis in his mother; Cancer in his brother; Colon polyps in his mother; Congestive Heart Failure in his brother; Coronary artery disease in his brother and mother; Heart attack in his brother; Heart disease in his mother.   ROS:  Please see the history of present illness.   Otherwise, review of systems is positive for none.   All other systems are reviewed and negative.   PHYSICAL EXAM: VS:  BP 112/64   Pulse 61   Ht '5\' 9"'$  (1.753 m)   Wt 188 lb 12.8 oz (85.6 kg)   SpO2 98%   BMI 27.88 kg/m  , BMI Body mass index is 27.88 kg/m. GEN: Well nourished, well developed, in no acute distress  HEENT: normal  Neck: no JVD, carotid bruits, or masses Cardiac: RRR; no murmurs, rubs, or gallops,no edema  Respiratory:  clear to auscultation bilaterally, normal work of breathing GI: soft, nontender, nondistended, + BS MS: no deformity or atrophy  Skin: warm and dry, device site well healed Neuro:  Strength and sensation are intact Psych: euthymic mood, full affect  EKG:  EKG is ordered today. Personal review of the ekg ordered shows AV paced, changing dual-chamber pacemaker Complete heart block of PAF back in March durations wearing she was also early Vertie Dibbern have conversation about 3 months she would like to the  Personal review of the device interrogation today. Results in [place]: 10/22/2020: ALT 12; BUN 15; Creatinine, Ser 1.01; Potassium 4.0; Sodium 137 04/14/2021: Hemoglobin 12.3; Platelets 164    Lipid Panel     Component Value Date/Time   CHOL 152 10/22/2020 0823   CHOL 147 05/21/2020 0721   TRIG 47.0 10/22/2020 0823   HDL 71.90 10/22/2020 0823   HDL 66 05/21/2020 0721   CHOLHDL 2 10/22/2020 0823   VLDL 9.4 10/22/2020 0823   LDLCALC 70 10/22/2020 0823   LDLCALC 71 05/21/2020 0721     Wt Readings from Last 3 Encounters:  09/14/21 188 lb 12.8 oz (85.6 kg)  06/10/21 186 lb 9.6 oz (84.6 kg)  04/14/21 190 lb 9.6 oz (86.5 kg)      Other studies  Reviewed: Additional studies/ records that were reviewed today include: TTE 06/10/21 Review of the above records today demonstrates:   1. Left ventricular ejection fraction, by estimation, is 60 to  65%. The  left ventricle has normal function. The left ventricle has no regional  wall motion abnormalities. Left ventricular diastolic parameters are  consistent with Grade I diastolic  dysfunction (impaired relaxation). Elevated left ventricular end-diastolic  pressure. The E/e' is 94.   2. Right ventricular systolic function is normal. The right ventricular  size is normal. There is normal pulmonary artery systolic pressure. The  estimated right ventricular systolic pressure is [id number] mmHg.   3. Left atrial size was mildly dilated.   4. The mitral valve is abnormal. Mild to moderate mitral valve  regurgitation.   5. The aortic valve is tricuspid. Aortic valve regurgitation is not  visualized. Mild to moderate aortic valve stenosis. Aortic valve area, by  VTI measures 1.82 cm. Aortic valve mean gradient measures 25.0 mmHg.  Aortic valve Vmax measures 3.18 m/s. Sharma Covert  is 0.40.   6. Aortic dilatation noted. There is mild dilatation of the ascending  aorta, measuring 42 mm.   7. The inferior vena cava is normal in size with greater than 50%  respiratory variability, suggesting right atrial pressure of 3 mmHg.   ASSESSMENT AND PLAN:  1.  Paroxysmal atrial fibrillation: Currently on Eliquis 5 mg twice daily, Multaq 400 mg twice daily.  High risk medication monitoring via ECG.  CHA2DS2-VASc 3.  He has had minimal episodes of atrial fibrillation based on device interrogation.  He currently feels well.  2.  Hypertension: Currently well controlled  3.  Hyperlipidemia: Continue Crestor 20 mg daily  4.  Coronary artery disease: Status post CABG times current chest pain.  5.  Sick sinus syndrome status post Medtronic pacemaker implanted 05/12/2016.  Appropriately.  No changes at this time.  6.   Nonsustained VT: None noted today on device interrogation none  7.  Moderate aortic stenosis: Has a planned echo in 1 year.  Followed by primary cardiology.  Current medicines are reviewed at length with the patient today.   The patient does not have concerns regarding his medicines.  The following changes were made today: None  Labs/ tests ordered today include:  Orders Placed This Encounter  Procedures   EKG 12-Lead      Disposition:   FU with Shiva Karis 12 months  Signed, Sharise Lippy [name], MD  09/14/2021 10:02 AM     [facility] [address] [place] [address] [phone] (office) [phone] (fax)

## 2021-09-21 ENCOUNTER — Ambulatory Visit (INDEPENDENT_AMBULATORY_CARE_PROVIDER_SITE_OTHER): Payer: Medicare Other

## 2021-09-21 DIAGNOSIS — I495 Sick sinus syndrome: Secondary | ICD-10-CM

## 2021-09-21 LAB — CUP PACEART REMOTE DEVICE CHECK
Battery Remaining Longevity: 39 mo
Battery Voltage: 2.97 V
Brady Statistic AP VP Percent: 99.15 %
Brady Statistic AP VS Percent: 0.02 %
Brady Statistic AS VP Percent: 0.83 %
Brady Statistic AS VS Percent: 0 %
Brady Statistic RA Percent Paced: 95.94 %
Brady Statistic RV Percent Paced: 99.89 %
Date Time Interrogation Session: 20220926095856
Implantable Lead Implant Date: 20170517
Implantable Lead Implant Date: 20170517
Implantable Lead Location: 753859
Implantable Lead Location: 753860
Implantable Lead Model: 5076
Implantable Lead Model: 5076
Implantable Pulse Generator Implant Date: 20170517
Lead Channel Impedance Value: 361 Ohm
Lead Channel Impedance Value: 418 Ohm
Lead Channel Impedance Value: 475 Ohm
Lead Channel Impedance Value: 589 Ohm
Lead Channel Pacing Threshold Amplitude: 0.625 V
Lead Channel Pacing Threshold Amplitude: 0.875 V
Lead Channel Pacing Threshold Pulse Width: 0.4 ms
Lead Channel Pacing Threshold Pulse Width: 0.4 ms
Lead Channel Sensing Intrinsic Amplitude: 1.5 mV
Lead Channel Sensing Intrinsic Amplitude: 1.5 mV
Lead Channel Sensing Intrinsic Amplitude: 5.75 mV
Lead Channel Sensing Intrinsic Amplitude: 5.75 mV
Lead Channel Setting Pacing Amplitude: 2 V
Lead Channel Setting Pacing Amplitude: 2.5 V
Lead Channel Setting Pacing Pulse Width: 0.4 ms
Lead Channel Setting Sensing Sensitivity: 2.8 mV

## 2021-09-23 DIAGNOSIS — M25551 Pain in right hip: Secondary | ICD-10-CM | POA: Diagnosis not present

## 2021-09-23 DIAGNOSIS — M7061 Trochanteric bursitis, right hip: Secondary | ICD-10-CM | POA: Diagnosis not present

## 2021-09-23 DIAGNOSIS — S39012A Strain of muscle, fascia and tendon of lower back, initial encounter: Secondary | ICD-10-CM | POA: Diagnosis not present

## 2021-09-28 NOTE — Progress Notes (Signed)
Remote pacemaker transmission.   

## 2021-10-19 ENCOUNTER — Other Ambulatory Visit: Payer: Self-pay | Admitting: Cardiology

## 2021-10-19 DIAGNOSIS — U071 COVID-19: Secondary | ICD-10-CM | POA: Insufficient documentation

## 2021-10-21 ENCOUNTER — Telehealth: Payer: Medicare Other | Admitting: Nurse Practitioner

## 2021-10-21 ENCOUNTER — Other Ambulatory Visit: Payer: Self-pay

## 2021-10-21 ENCOUNTER — Encounter: Payer: Self-pay | Admitting: Family Medicine

## 2021-10-21 ENCOUNTER — Telehealth (INDEPENDENT_AMBULATORY_CARE_PROVIDER_SITE_OTHER): Payer: Medicare Other | Admitting: Family Medicine

## 2021-10-21 VITALS — BP 118/61 | HR 67 | Temp 100.4°F | Ht 70.0 in | Wt 175.0 lb

## 2021-10-21 DIAGNOSIS — Z950 Presence of cardiac pacemaker: Secondary | ICD-10-CM | POA: Diagnosis not present

## 2021-10-21 DIAGNOSIS — Z951 Presence of aortocoronary bypass graft: Secondary | ICD-10-CM | POA: Diagnosis not present

## 2021-10-21 DIAGNOSIS — I2510 Atherosclerotic heart disease of native coronary artery without angina pectoris: Secondary | ICD-10-CM | POA: Diagnosis not present

## 2021-10-21 DIAGNOSIS — U071 COVID-19: Secondary | ICD-10-CM | POA: Diagnosis not present

## 2021-10-21 DIAGNOSIS — D869 Sarcoidosis, unspecified: Secondary | ICD-10-CM

## 2021-10-21 MED ORDER — MOLNUPIRAVIR EUA 200MG CAPSULE: 4.0000 | ORAL_CAPSULE | Freq: Two times a day (BID) | ORAL | 0 refills | Status: AC

## 2021-10-21 NOTE — Progress Notes (Addendum)
Claudean Leavelle T. Wallis Vancott, MD Primary Care and [place] at [facility] [place], [address] Phone: [phone]  FAX: [phone]  Oral Remache - 80 y.o. male  MRN [id number]  Date of Birth: [birth year]  Visit Date: 10/21/2021  PCP: [name], MD  Referred by: [name], MD  Virtual Visit via Video Note:  I connected with  Prince Rome on 10/21/2021 12:00 PM EDT by a video enabled telemedicine application and verified that I am speaking with the correct person using two identifiers.   Location patient: home computer, tablet, or smartphone Location provider: work or home office Consent: Verbal consent directly obtained from [employer]. Persons participating in the virtual visit: patient, provider  I discussed the limitations of evaluation and management by telemedicine and the availability of in person appointments. The patient expressed understanding and agreed to proceed.  Interactive audio and video telecommunications were attempted between this provider and patient, however failed, due to patient having technical difficulties OR patient did not have access to video capability.  We continued and completed visit with audio only.    Chief Complaint  Patient presents with   Covid Positive    +home test x 2 one yesterday & one this morning. Symptoms started on Monday evening   Nasal Congestion   Fever   Chills   Cough   Generalized Body Aches   Diarrhea    History of Present Illness:  He feels really pretty bad.  High fevers as below, diffuse polyarthralgias, cough, chills, nasal congestion, diarrhea, generalized malaise as well as nausea.  Started on Monday night. Yes + Today, very positive test.  Sx as above.  101.5 fever this morning.  Body aches Pain in feet  CABG 2020  Pulmonary Sarcoid -     Immunization History  Administered Date(s) Administered   Fluad Quad(high Dose 65+)  08/14/2019, 09/22/2020   Influenza Split 09/26/2012   Influenza, High Dose Seasonal PF 09/02/2015, 08/24/2016, 09/04/2018   Influenza,inj,Quad PF,6+ Mos 09/25/2013, 09/12/2014   Influenza-Unspecified 09/15/2017, 09/05/2018   PFIZER(Purple Top)SARS-COV-2 Vaccination 01/11/2020, 02/01/2020, 09/22/2020   Pneumococcal Conjugate-13 12/13/2013   Pneumococcal Polysaccharide-23 02/09/2016   Tdap 01/13/2016   Zoster Recombinat (Shingrix) 12/21/2017, 02/24/2018     Review of Systems as above: See pertinent positives and pertinent negatives per HPI No acute distress verbally   Observations/Objective/Exam:  An attempt was made to discern vital signs over the phone and per patient if applicable and possible.   General:    Alert, Oriented, appears well and in no acute distress  Pulmonary:     On inspection no signs of respiratory distress.  Psych / Neurological:     Pleasant and cooperative.  Assessment and Plan:    ICD-10-CM   1. COVID-19  U07.1     2. S/P CABG x 3  Z95.1     3. S/P placement of cardiac pacemaker  Z95.0     4. PULMONARY SARCOIDOSIS  D86.9       Total encounter time: 20 minutes. This includes total time spent on the day of encounter.  This is a fairly complicated medical case, and additional history was spent reviewing medical problems, specialty notes, as well as prior testing.  Acute confirmed COVID with a very high risk patient including pulmonary sarcoidosis and extensive coronary disease.  Caution reviewed including primarily shortness of breath, and deteriorating pulmonary disease.  I discussed the assessment and treatment plan with  the patient. The patient was provided an opportunity to ask questions and all were answered. The patient agreed with the plan and demonstrated an understanding of the instructions.   The patient was advised to call back or seek an in-person evaluation if the symptoms worsen or if the condition fails to improve as  anticipated.  Follow-up: prn unless noted otherwise below No follow-ups on file.  Meds ordered this encounter  Medications   molnupiravir EUA (LAGEVRIO) 200 mg CAPS capsule    Sig: Take 4 capsules (800 mg total) by mouth 2 (two) times daily for 5 days.    Dispense:  40 capsule    Refill:  0   No orders of the defined types were placed in this encounter.   Signed,  Maud Deed. Tery Hoeger, MD

## 2021-10-21 NOTE — Progress Notes (Signed)
Subjective:   Derrick [name] is a 80 y.o. male who presents for Medicare Annual/Subsequent preventive examination.   I connected with [name] today by telephone and verified that I am speaking with the correct person using two identifiers. Location patient: home Location provider: work Persons participating in the virtual visit: patient, Marine scientist.    I discussed the limitations, risks, security and privacy concerns of performing an evaluation and management service by telephone and the availability of in person appointments. I also discussed with the patient that there may be a patient responsible charge related to this service. The patient expressed understanding and verbally consented to this telephonic visit.    Interactive audio and video telecommunications were attempted between this provider and patient, however failed, due to patient having technical difficulties OR patient did not have access to video capability.  We continued and completed visit with audio only.  Some vital signs may be absent or patient reported.   Time Spent with patient on telephone encounter: 30 minutes   Review of Systems     Cardiac Risk Factors include: advanced age (>3men, >10 women);hypertension;dyslipidemia     Objective:    Today's Vitals   10/22/21 0901  Weight: 175 lb (79.4 kg)  Height: 5\' 9"  (1.753 m)   Body mass index is 25.84 kg/m.  Advanced Directives 10/22/2021 10/21/2020 03/11/2020 03/04/2020 09/26/2019 06/08/2019 06/06/2019  Does Patient Have a Medical Advance Directive? Yes Yes Yes Yes Yes - Yes  Type of Advance Directive [place];Living will [place];Living will [place];Living will Living will;Healthcare Power of [place];Living will [place];Living will [place];Living will  Does patient want to make changes to medical advance directive? Yes  (MAU/Ambulatory/Procedural Areas - Information given) - No - Patient declined No - Patient declined - No - Patient declined -  Copy of [place] in Chart? Yes - validated most recent copy scanned in chart (See row information) Yes - validated most recent copy scanned in chart (See row information) No - copy requested No - copy requested Yes - validated most recent copy scanned in chart (See row information) - Yes - validated most recent copy scanned in chart (See row information)  Would patient like information on creating a medical advance directive? - - - - - - -    Current Medications (verified) Outpatient Encounter Medications as of 10/22/2021  Medication Sig   albuterol (VENTOLIN HFA) 108 (90 Base) MCG/ACT inhaler Inhale 2 puffs into the lungs every 6 (six) hours as needed for wheezing or shortness of breath.   amLODipine (NORVASC) 5 MG tablet Take 1 tablet by mouth once daily   apixaban (ELIQUIS) 5 MG TABS tablet Take 1 tablet by mouth twice daily   bimatoprost (LUMIGAN) 0.01 % SOLN Place 1 drop into both eyes at bedtime.   Cholecalciferol 25 MCG (1000 UT) tablet Take 1,000 Units by mouth daily.    dorzolamide-timolol (COSOPT) 22.3-6.8 MG/ML ophthalmic solution Place 1 drop into both eyes 2 (two) times daily.    dronedarone (MULTAQ) 400 MG tablet TAKE 1 TABLET BY MOUTH TWICE DAILY WITH MEALS   finasteride (PROSCAR) 5 MG tablet Take 5 mg by mouth daily after breakfast.    fluticasone (FLONASE) 50 MCG/ACT nasal spray Place 2 sprays into both nostrils 2 (two) times daily.   Fluticasone Propionate, Inhal, (FLOVENT DISKUS) 100 MCG/BLIST AEPB Inhale 1 Inhaler into the lungs 2 (two) times daily.  metoprolol succinate (TOPROL-XL) 25 MG 24 hr tablet TAKE 1 TABLET BY MOUTH AT BEDTIME   molnupiravir EUA (LAGEVRIO) 200 mg CAPS capsule Take 4 capsules (800 mg total) by mouth 2 (two) times daily for 5 days.   Multiple Vitamin (MULTIVITAMIN WITH MINERALS) TABS tablet Take 1 tablet by  mouth daily.   NONFORMULARY OR COMPOUNDED ITEM Neuropathy cream transdermal therapeutics   pantoprazole (PROTONIX) 40 MG tablet Take 1 tablet by mouth once daily   Polyethyl Glycol-Propyl Glycol (SYSTANE OP) Place 1 drop into both eyes daily as needed (dry eyes).   rosuvastatin (CRESTOR) 20 MG tablet Take 1 tablet by mouth once daily   tadalafil (CIALIS) 5 MG tablet Take 5 mg by mouth at bedtime.    triamterene-hydrochlorothiazide (MAXZIDE-25) 37.5-25 MG tablet Take 1 tablet by mouth once daily   No facility-administered encounter medications on file as of 10/22/2021.    Allergies (verified) Nsaids   History: Past Medical History:  Diagnosis Date   Anemia    iron deficiency anemia- infusion on 02/15/2020 and 02/22/2020   BENIGN PROSTATIC HYPERTROPHY, WITH OBSTRUCTION 05/28/2010   CAD (coronary artery disease)    a. s/p NSTEMI 06/01/11: DES to RCA;  b. cath 06/25/11:   dLM 50-60% (FFR 0.87), prox to mid LAD 40-50%, D1 50%, pCFX 50%, RCA stent ok, dPDA 80%, EF 55-60%.  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   DISH (diffuse idiopathic skeletal hyperostosis) 12/02/2015   Diverticulitis    GENERALIZED OSTEOARTHROSIS UNSPECIFIED SITE 05/28/2010   GERD 05/28/2010   Heart murmur    History of gout    History of kidney stones    HYPERLIPIDEMIA 05/28/2010   Hypertension    IgM lambda paraproteinemia    Intrinsic asthma, 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   "associated w/lung sarcoidosis"   Malignant neoplasm of descending colon ([place])  05/28/2010   MITRAL VALVE PROLAPSE 05/28/2010   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   NSTEMI (non-ST elevated myocardial infarction) ([place]) 06/03/11   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   OSA on CPAP 05/28/2010   PALPITATIONS, CHRONIC 05/28/2010   Parotid gland pain 2010   infection   Peripheral neuropathy    "tx'd w/targeted chemo" (05/12/2016)   Pneumonia 2-3 times   Presence of permanent cardiac pacemaker    Medtronic   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   Sarcoidosis    pulmonalis   UNSPECIFIED INFLAMMATORY AND TOXIC NEUROPATHY 05/28/2010   Vision loss    VITAMIN D DEFICIENCY 05/28/2010   Past Surgical History:  Procedure Laterality Date   APPENDECTOMY  1954   age 35   CATARACT EXTRACTION W/ INTRAOCULAR LENS IMPLANT Left    COLON SURGERY  2000   decending colon    CORONARY ANGIOPLASTY WITH STENT PLACEMENT  06/03/11   CORONARY ARTERY BYPASS GRAFT N/A 06/05/2019   Procedure: CORONARY ARTERY BYPASS GRAFTING (CABG) TIMES THREE USING LEFT INTERNAL MAMMARY ARTERY AND RIGHT GREATER SEPHANOUS VEIN;  Surgeon: [name], MD;  Location: [facility];  Service: Open Heart  Surgery;  Laterality: N/A;   ELECTROPHYSIOLOGIC STUDY N/A 05/12/2016   Procedure: Cardioversion;  Surgeon: [name], MD;  Location: [facility];  Service: Cardiovascular;  Laterality: N/A;   EP IMPLANTABLE DEVICE N/A 05/12/2016   Procedure: Pacemaker Implant;  Surgeon: [name], MD;  Location: [facility];  Service: Cardiovascular;  Laterality: N/A;   EYE SURGERY     bilateral cataract with lens implant   HERNIA REPAIR  01/2019   left inguinal hernia repair   INGUINAL HERNIA REPAIR Left 02/21/2019   Procedure: LEFT INGUINAL HERNIA REPAIR WITH MESH;  Surgeon: [name], MD;  Location: [facility];  Service: General;  Laterality: Left;   INSERT / REPLACE / REMOVE PACEMAKER  05/12/2016   INTRAVASCULAR PRESSURE WIRE/FFR STUDY N/A 05/15/2019   Procedure: INTRAVASCULAR PRESSURE  WIRE/FFR STUDY;  Surgeon: [name], MD;  Location: [facility];  Service: Cardiovascular;  Laterality: N/A;   KNEE ARTHROSCOPY Right 2003   LEFT HEART CATH AND CORONARY ANGIOGRAPHY N/A 05/15/2019   Procedure: LEFT HEART CATH AND CORONARY ANGIOGRAPHY;  Surgeon: [name], MD;  Location: [facility];  Service: Cardiovascular;  Laterality: N/A;   LEFT HEART CATHETERIZATION WITH CORONARY ANGIOGRAM N/A 03/31/2015   Procedure: LEFT HEART CATHETERIZATION WITH CORONARY ANGIOGRAM;  Surgeon: [name], MD;  Location: [facility] C[facility];  Service: Cardiovascular;  Laterality: N/A;   LUNG SURGERY  2001   "open lung dissection"   MOHS SURGERY Left ~ 2008   ear   PROSTATE BIOPSY  2010   TEE WITHOUT CARDIOVERSION N/A 06/05/2019   Procedure: TRANSESOPHAGEAL ECHOCARDIOGRAM (TEE);  Surgeon: [name], [name], MD;  Location: [facility];  Service: Open Heart Surgery;  Laterality: N/A;   TONSILLECTOMY  1950   TOTAL KNEE ARTHROPLASTY Right 03/11/2020   Procedure: TOTAL KNEE ARTHROPLASTY;  Surgeon: [name], MD;  Location: [facility];  Service: Orthopedics;  Laterality: Right;  70 mins   Family History  Problem Relation Age of Onset   Arthritis Mother        severe   Coronary artery disease Mother    Colon polyps Mother    Heart disease Mother    Coronary artery disease Brother    Cancer Brother        choriocarcinoma   Arrhythmia Brother 46       Afib/Tachycardia   Heart attack Brother    Congestive Heart Failure Brother    Social History   Socioeconomic History   Marital status: Married    Spouse name: Not on file   Number of children: 2   Years of education: MS-Coll.   Highest education level: Not on file  Occupational History   Occupation: Consultant    Comment: Building services engineer  Tobacco Use   Smoking status: Never   Smokeless tobacco: Never  Vaping Use   Vaping Use: Never used  Substance and Sexual Activity   Alcohol use: Yes    Comment: occasional   Drug use:  No   Sexual activity: Yes    Birth control/protection: None  Other Topics Concern   Not on file  Social History Narrative   College- [employer]; [employer]-environment science and mgt. Married [name])  "44" 1 son- 87; 1 dtr "31" 2 grandchildren. 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 End of life-provided discussive context and provided packet.   Social Determinants of Health   Financial Resource Strain: Low Risk    Difficulty of Paying Living Expenses: Not hard at all  Food  Insecurity: No Food Insecurity   Worried About Charity fundraiser in the Last Year: Never true   Ran Out of Food in the Last Year: Never true  Transportation Needs: No Transportation Needs   Lack of Transportation (Medical): No   Lack of Transportation (Non-Medical): No  Physical Activity: Sufficiently Active   Days of Exercise per Week: 7 days   Minutes of Exercise per Session: 50 min  Stress: No Stress Concern Present   Feeling of Stress : Not at all  Social Connections: Moderately Integrated   Frequency of Communication with Friends and Family: More than three times a week   Frequency of Social Gatherings with Friends and Family: More than three times a week   Attends Religious Services: More than 4 times per year   Active Member of [employer] or Organizations: No   Attends Music therapist: Never   Marital Status: Married    Tobacco Counseling Counseling given: Not Answered   Clinical Intake:  Pre-visit preparation completed: Yes  Pain : No/denies pain     BMI - recorded: 25.84 Nutritional Status: BMI 25 -29 Overweight Nutritional Risks: None Diabetes: No  How often do you need to have someone help you when you read instructions, pamphlets, or other written materials from your doctor or pharmacy?: 1 - Never  Diabetic?No  Interpreter Needed?: No  Information entered by :: Orrin Brigham LPN   Activities of Daily Living In your present state of health, do you have any  difficulty performing the following activities: 10/22/2021  Hearing? Y  Vision? N  Difficulty concentrating or making decisions? N  Walking or climbing stairs? N  Dressing or bathing? N  Doing errands, shopping? N  Preparing Food and eating ? N  Using the Toilet? N  In the past six months, have you accidently leaked urine? N  Do you have problems with loss of bowel control? N  Managing your Medications? N  Managing your Finances? N  Housekeeping or managing your Housekeeping? N  Some recent data might be hidden    Patient Care Team: [name], MD as PCP - General (Family Medicine) [name], MD as PCP - Electrophysiology (Cardiology) [name], MD as PCP - Cardiology (Cardiology) Heath Lark, MD as Consulting Physician (Hematology and Oncology) Debbora Dus, [facility] as Pharmacist (Pharmacist)  Indicate any recent Medical Services you may have received from other than [facility] providers in the past year (date may be approximate).     Assessment:   This is a routine wellness examination for [place].  Hearing/Vision screen Hearing Screening - Comments:: Slight hearing impairment in right ear.  Vision Screening - Comments:: Last eye exam 09/2021, has glaucoma, Dr. Satira Sark with [facility] ophthalmology.   Dietary issues and exercise activities discussed: Current Exercise Habits: Home exercise routine, Type of exercise: walking, Time (Minutes): 45, Frequency (Times/Week): 7, Weekly Exercise (Minutes/Week): 315, Intensity: Moderate, Exercise limited by: cardiac condition(s)   Goals Addressed             This Visit's Progress    Patient Stated       Continue to walk 2 miles and drinking more water. Eating a heart healthy diet.       Depression Screen PHQ 2/9 Scores 10/22/2021 10/21/2020 06/27/2020 09/26/2019 08/22/2019 07/16/2019 07/10/2019  PHQ - 2 Score 0 0 0 0 0 0 0  PHQ- 9 Score - 0 - 0 - - -    Fall Risk Fall Risk  10/22/2021 10/21/2020 11/05/2019  09/26/2019  07/26/2018  Falls in the past year? 1 0 0 1 Yes  Comment tripped - - - -  Number falls in past yr: 0 0 - 0 2 or more  Comment trimming shrubs - - - -  Injury with Fall? 0 0 - 0 No  Comment soreness - - - -  Risk for fall due to : Other (Comment) Medication side effect - Medication side effect;Impaired balance/gait Other (Comment)  Risk for fall due to: Comment tripped - - - neuropathy  Follow up Falls prevention discussed Falls evaluation completed;Falls prevention discussed - Falls evaluation completed;Falls prevention discussed -    FALL RISK PREVENTION PERTAINING TO THE HOME:  Any stairs in or around the home? Yes  If so, are there any without handrails? No  Home free of loose throw rugs in walkways, pet beds, electrical cords, etc? Yes  Adequate lighting in your home to reduce risk of falls? Yes   ASSISTIVE DEVICES UTILIZED TO PREVENT FALLS:  Life alert? No  Use of a cane, walker or w/c? No  Grab bars in the bathroom? Yes  Shower chair or bench in shower? Yes  Elevated toilet seat or a handicapped toilet? Yes   TIMED UP AND GO:  Was the test performed? No . Visit completed over the phone.   Cognitive Function: Normal cognitive status assessed by this Nurse Health Advisor. No abnormalities found.   MMSE - Mini Mental State Exam 10/21/2020 09/26/2019 06/13/2018  Orientation to time 5 5 5   Orientation to Place 5 5 5   Registration 3 3 3   Attention/ Calculation 5 5 0  Recall 3 2 1   Recall-comments - - unable to recall 2 of 3 words  Language- name 2 objects - - 0  Language- repeat 1 1 1   Language- follow 3 step command - - 3  Language- read & follow direction - - 0  Write a sentence - - 0  Copy design - - 0  Total score - - 18   Montreal Cognitive Assessment  11/05/2019 07/26/2018 01/26/2018  Visuospatial/ Executive (0/5) 4 2 4   Naming (0/3) 3 3 3   Attention: Read list of digits (0/2) 2 2 1   Attention: Read list of letters (0/1) 1 1 1   Attention: Serial 7  subtraction starting at 100 (0/3) 3 3 3   Language: Repeat phrase (0/2) 2 2 1   Language : Fluency (0/1) 1 1 0  Abstraction (0/2) 2 2 2   Delayed Recall (0/5) 5 4 3   Orientation (0/6) 6 5 6   Total 29 25 24       Immunizations Immunization History  Administered Date(s) Administered   Fluad Quad(high Dose 65+) 08/14/2019, 09/22/2020   Influenza Split 09/26/2012   Influenza, High Dose Seasonal PF 09/02/2015, 08/24/2016, 09/04/2018   Influenza,inj,Quad PF,6+ Mos 09/25/2013, 09/12/2014   Influenza-Unspecified 09/15/2017, 09/05/2018   PFIZER(Purple Top)SARS-COV-2 Vaccination 01/11/2020, 02/01/2020, 09/22/2020   Pneumococcal Conjugate-13 12/13/2013   Pneumococcal Polysaccharide-23 02/09/2016   Tdap 01/13/2016   Zoster Recombinat (Shingrix) 12/21/2017, 02/24/2018    TDAP status: Up to date  Flu Vaccine status: Up to date,patient states vaccine completed 09/07/21. Patient will bring updated information to next appointment.  Pneumococcal vaccine status: Up to date  Covid-19 vaccine status: Completed vaccines, patient states newest booster completed 09/07/21. Patient will bring updated information to next appointment.  Qualifies for Shingles Vaccine? Yes   Zostavax completed No   Shingrix Completed?: Yes  Screening Tests Health Maintenance  Topic Date Due   COVID-19 Vaccine (4 - Booster for  Pfizer series) 11/17/2020   INFLUENZA VACCINE  07/27/2021   COLONOSCOPY (Pts 45-43yrs Insurance coverage will need to be confirmed)  11/29/2023   TETANUS/TDAP  01/12/2026   Pneumonia Vaccine 14+ Years old  Completed   Hepatitis C Screening  Completed   Zoster Vaccines- Shingrix  Completed   HPV VACCINES  Aged Out    Health Maintenance  Health Maintenance Due  Topic Date Due   COVID-19 Vaccine (4 - Booster for Pfizer series) 11/17/2020   INFLUENZA VACCINE  07/27/2021    Colorectal cancer screening: Type of screening: Colonoscopy. Completed 11/28/20. Repeat every 3 years, patient states per  PCP.  Lung Cancer Screening: (Low Dose CT Chest recommended if Age 47-80 years, 30 pack-year currently smoking OR have quit w/in 15years.) does not qualify.     Additional Screening:  Hepatitis C Screening: Completed 10/28/20  Vision Screening: Recommended annual ophthalmology exams for early detection of glaucoma and other disorders of the eye. Is the patient up to date with their annual eye exam?  Yes  Who is the provider or what is the name of the office in which the patient attends annual eye exams? Dr. Satira Sark    Dental Screening: Recommended annual dental exams for proper oral hygiene  Community Resource Referral / Chronic Care Management: CRR required this visit?  No   CCM required this visit?  No      Plan:     I have personally reviewed and noted the following in the patient's chart:   Medical and social history Use of alcohol, tobacco or illicit drugs  Current medications and supplements including opioid prescriptions. Patient is not currently taking opioid prescriptions. Functional ability and status Nutritional status Physical activity Advanced directives List of other physicians Hospitalizations, surgeries, and ER visits in previous 12 months Vitals Screenings to include cognitive, depression, and falls Referrals and appointments  In addition, I have reviewed and discussed with patient certain preventive protocols, quality metrics, and best practice recommendations. A written personalized care plan for preventive services as well as general preventive health recommendations were provided to patient.  Due to this being a telephonic visit, the after visit summary with patients personalized plan was offered to patient via mail or my-chart.Patient would like to access on my-chart/ per request,     Loma Messing, LPN   [id number]   Nurse Health Advisor  Nurse Notes: None

## 2021-10-22 ENCOUNTER — Ambulatory Visit (INDEPENDENT_AMBULATORY_CARE_PROVIDER_SITE_OTHER): Payer: Medicare Other

## 2021-10-22 VITALS — Ht 69.0 in | Wt 175.0 lb

## 2021-10-22 DIAGNOSIS — Z0000 Encounter for general adult medical examination without abnormal findings: Secondary | ICD-10-CM

## 2021-10-22 NOTE — Patient Instructions (Addendum)
Derrick [name] , Thank you for taking time to complete your Medicare Wellness Visit. I appreciate your ongoing commitment to your health goals. Please review the following plan we discussed and let me know if I can assist you in the future.   Screening recommendations/referrals: Colonoscopy: Up to date, completed 11/28/20 Due 11/29/23  Recommended yearly ophthalmology/optometry visit for glaucoma screening and checkup Recommended yearly dental visit for hygiene and checkup  Vaccinations: Influenza vaccine: Up to date, completed 09/07/21 , please bring updated vaccine information to your next appointment. Pneumococcal vaccine: Up to date Tdap vaccine: Up to date, completed 01/13/16, Due 01/12/26  Shingles vaccine: Up to date   Covid-19: completed 09/07/21, please bring updated vaccine information to your next appointment.  Advanced directives: Copy on file   Conditions/risks identified: see problem list  Next appointment: Follow up in one year for your annual wellness visit. 10/26/22 @ 9:00am, this will be a telephone visit per your request  Preventive Care 78 Years and Older, Male Preventive care refers to lifestyle choices and visits with your health care provider that can promote health and wellness. What does preventive care include? A yearly physical exam. This is also called an annual well check. Dental exams once or twice a year. Routine eye exams. Ask your health care provider how often you should have your eyes checked. Personal lifestyle choices, including: Daily care of your teeth and gums. Regular physical activity. Eating a healthy diet. Avoiding tobacco and drug use. Limiting alcohol use. Practicing safe sex. Taking low doses of aspirin every day. Taking vitamin and mineral supplements as recommended by your health care provider. What happens during an annual well check? The services and screenings done by your health care provider during your annual well check will depend on  your age, overall health, lifestyle risk factors, and family history of disease. Counseling  Your health care provider may ask you questions about your: Alcohol use. Tobacco use. Drug use. Emotional well-being. Home and relationship well-being. Sexual activity. Eating habits. History of falls. Memory and ability to understand (cognition). Work and work Statistician. Screening  You may have the following tests or measurements: Height, weight, and BMI. Blood pressure. Lipid and cholesterol levels. These may be checked every 5 years, or more frequently if you are over 78 years old. Skin check. Lung cancer screening. You may have this screening every year starting at age 78 if you have a 30-pack-year history of smoking and currently smoke or have quit within the past 15 years. Fecal occult blood test (FOBT) of the stool. You may have this test every year starting at age 78. Flexible sigmoidoscopy or colonoscopy. You may have a sigmoidoscopy every 5 years or a colonoscopy every 10 years starting at age 78. Prostate cancer screening. Recommendations will vary depending on your family history and other risks. Hepatitis C blood test. Hepatitis B blood test. Sexually transmitted disease (STD) testing. Diabetes screening. This is done by checking your blood sugar (glucose) after you have not eaten for a while (fasting). You may have this done every 1-3 years. Abdominal aortic aneurysm (AAA) screening. You may need this if you are a current or former smoker. Osteoporosis. You may be screened starting at age 78 if you are at high risk. Talk with your health care provider about your test results, treatment options, and if necessary, the need for more tests. Vaccines  Your health care provider may recommend certain vaccines, such as: Influenza vaccine. This is recommended every year. Tetanus, diphtheria, and acellular  pertussis (Tdap, Td) vaccine. You may need a Td booster every 10 years. Zoster  vaccine. You may need this after age 78. Pneumococcal 13-valent conjugate (PCV13) vaccine. One dose is recommended after age 78. Pneumococcal polysaccharide (PPSV23) vaccine. One dose is recommended after age 78. Talk to your health care provider about which screenings and vaccines you need and how often you need them. This information is not intended to replace advice given to you by your health care provider. Make sure you discuss any questions you have with your health care provider. Document Released: 01/09/2016 Document Revised: 09/01/2016 Document Reviewed: 10/14/2015 Elsevier Interactive Patient Education  2017 [place] Prevention in the Home Falls can cause injuries. They can happen to people of all ages. There are many things you can do to make your home safe and to help prevent falls. What can I do on the outside of my home? Regularly fix the edges of walkways and driveways and fix any cracks. Remove anything that might make you trip as you walk through a door, such as a raised step or threshold. Trim any bushes or trees on the path to your home. Use bright outdoor lighting. Clear any walking paths of anything that might make someone trip, such as rocks or tools. Regularly check to see if handrails are loose or broken. Make sure that both sides of any steps have handrails. Any raised decks and porches should have guardrails on the edges. Have any leaves, snow, or ice cleared regularly. Use sand or salt on walking paths during winter. Clean up any spills in your garage right away. This includes oil or grease spills. What can I do in the bathroom? Use night lights. Install grab bars by the toilet and in the tub and shower. Do not use towel bars as grab bars. Use non-skid mats or decals in the tub or shower. If you need to sit down in the shower, use a plastic, non-slip stool. Keep the floor dry. Clean up any water that spills on the floor as soon as it happens. Remove  soap buildup in the tub or shower regularly. Attach bath mats securely with double-sided non-slip rug tape. Do not have throw rugs and other things on the floor that can make you trip. What can I do in the bedroom? Use night lights. Make sure that you have a light by your bed that is easy to reach. Do not use any sheets or blankets that are too big for your bed. They should not hang down onto the floor. Have a firm chair that has side arms. You can use this for support while you get dressed. Do not have throw rugs and other things on the floor that can make you trip. What can I do in the kitchen? Clean up any spills right away. Avoid walking on wet floors. Keep items that you use a lot in easy-to-reach places. If you need to reach something above you, use a strong step stool that has a grab bar. Keep electrical cords out of the way. Do not use floor polish or wax that makes floors slippery. If you must use wax, use non-skid floor wax. Do not have throw rugs and other things on the floor that can make you trip. What can I do with my stairs? Do not leave any items on the stairs. Make sure that there are handrails on both sides of the stairs and use them. Fix handrails that are broken or loose. Make sure that handrails  are as long as the stairways. Check any carpeting to make sure that it is firmly attached to the stairs. Fix any carpet that is loose or worn. Avoid having throw rugs at the top or bottom of the stairs. If you do have throw rugs, attach them to the floor with carpet tape. Make sure that you have a light switch at the top of the stairs and the bottom of the stairs. If you do not have them, ask someone to add them for you. What else can I do to help prevent falls? Wear shoes that: Do not have high heels. Have rubber bottoms. Are comfortable and fit you well. Are closed at the toe. Do not wear sandals. If you use a stepladder: Make sure that it is fully opened. Do not climb a  closed stepladder. Make sure that both sides of the stepladder are locked into place. Ask someone to hold it for you, if possible. Clearly mark and make sure that you can see: Any grab bars or handrails. First and last steps. Where the edge of each step is. Use tools that help you move around (mobility aids) if they are needed. These include: Canes. Walkers. Scooters. Crutches. Turn on the lights when you go into a dark area. Replace any light bulbs as soon as they burn out. Set up your furniture so you have a clear path. Avoid moving your furniture around. If any of your floors are uneven, fix them. If there are any pets around you, be aware of where they are. Review your medicines with your doctor. Some medicines can make you feel dizzy. This can increase your chance of falling. Ask your doctor what other things that you can do to help prevent falls. This information is not intended to replace advice given to you by your health care provider. Make sure you discuss any questions you have with your health care provider. Document Released: 10/09/2009 Document Revised: 05/20/2016 Document Reviewed: 01/17/2015 Elsevier Interactive Patient Education  2017 [employer].

## 2021-10-28 ENCOUNTER — Telehealth: Payer: Self-pay

## 2021-10-28 ENCOUNTER — Other Ambulatory Visit: Payer: Self-pay

## 2021-10-28 MED ORDER — MULTAQ 400 MG PO TABS: 400.0000 mg | ORAL_TABLET | Freq: Two times a day (BID) | ORAL | 2 refills | Status: DC

## 2021-10-28 NOTE — Telephone Encounter (Signed)
**Note De-Identified Amadi Yoshino Obfuscation** Multaq PA started through covermymeds. Key: Blase Mess Key: Tilda Burrow - PA Case ID: [id number] Outcome: This medication or product is on your plan's list of covered drugs. Prior authorization is not required at this time. If your pharmacy has questions regarding the processing of your prescription, please have them call the [facility] pharmacy help desk at ([phone].  Multaq 400MG  tablets Form: [facility] Medicare Part D Electronic Prior Authorization Form (2017 [place])  I have notified [place] of this outcome Aradhana Gin VM as I was on hold for more than 10 mins. I did advise in the VM message that they should call Jeani Hawking at Dr York Cerise office at [facility] if they have any questions.

## 2021-10-30 ENCOUNTER — Ambulatory Visit (INDEPENDENT_AMBULATORY_CARE_PROVIDER_SITE_OTHER): Payer: Medicare Other | Admitting: Family Medicine

## 2021-10-30 ENCOUNTER — Encounter: Payer: Self-pay | Admitting: Family Medicine

## 2021-10-30 ENCOUNTER — Other Ambulatory Visit: Payer: Self-pay

## 2021-10-30 VITALS — BP 118/74 | HR 70 | Temp 98.0°F | Ht 67.5 in | Wt 175.1 lb

## 2021-10-30 DIAGNOSIS — I480 Paroxysmal atrial fibrillation: Secondary | ICD-10-CM

## 2021-10-30 DIAGNOSIS — M2550 Pain in unspecified joint: Secondary | ICD-10-CM | POA: Diagnosis not present

## 2021-10-30 DIAGNOSIS — M25561 Pain in right knee: Secondary | ICD-10-CM | POA: Diagnosis not present

## 2021-10-30 DIAGNOSIS — I2510 Atherosclerotic heart disease of native coronary artery without angina pectoris: Secondary | ICD-10-CM | POA: Diagnosis not present

## 2021-10-30 DIAGNOSIS — D508 Other iron deficiency anemias: Secondary | ICD-10-CM

## 2021-10-30 DIAGNOSIS — E663 Overweight: Secondary | ICD-10-CM | POA: Diagnosis not present

## 2021-10-30 DIAGNOSIS — G63 Polyneuropathy in diseases classified elsewhere: Secondary | ICD-10-CM | POA: Diagnosis not present

## 2021-10-30 DIAGNOSIS — G6181 Chronic inflammatory demyelinating polyneuritis: Secondary | ICD-10-CM

## 2021-10-30 DIAGNOSIS — E559 Vitamin D deficiency, unspecified: Secondary | ICD-10-CM

## 2021-10-30 DIAGNOSIS — M4810 Ankylosing hyperostosis [Forestier], site unspecified: Secondary | ICD-10-CM

## 2021-10-30 DIAGNOSIS — Z6826 Body mass index (BMI) 26.0-26.9, adult: Secondary | ICD-10-CM | POA: Diagnosis not present

## 2021-10-30 DIAGNOSIS — D860 Sarcoidosis of lung: Secondary | ICD-10-CM | POA: Diagnosis not present

## 2021-10-30 DIAGNOSIS — E785 Hyperlipidemia, unspecified: Secondary | ICD-10-CM

## 2021-10-30 DIAGNOSIS — D472 Monoclonal gammopathy: Secondary | ICD-10-CM

## 2021-10-30 DIAGNOSIS — I10 Essential (primary) hypertension: Secondary | ICD-10-CM

## 2021-10-30 DIAGNOSIS — M150 Primary generalized (osteo)arthritis: Secondary | ICD-10-CM | POA: Diagnosis not present

## 2021-10-30 NOTE — Patient Instructions (Signed)
Keep up the great work on healthy lifestyle!

## 2021-10-30 NOTE — Progress Notes (Signed)
Patient ID: Derrick [name], male    DOB: January 10, 1941, 80 y.o.   MRN: [id number]  This visit was conducted in person.  BP 118/74   Pulse 70   Temp 98 F (36.7 C) (Temporal)   Ht 5' 7.5" (1.715 m)   Wt 175 lb 2 oz (79.4 kg)   SpO2 96%   BMI 27.02 kg/m    CC: Chief Complaint  Patient presents with   Annual Exam    Part 2    Subjective:   HPI: Derrick [name] is a 80 y.o. male presenting on 10/30/2021 for Annual Exam (Part 2) The patient presents for review of chronic health problems. He/She also has the following acute concerns today: none  The patient saw a LPN or RN for medicare wellness visit.  Prevention and wellness was reviewed in detail. Note reviewed and important notes copied below.  CAD,Paroxsysmal Afib/ heart block: Followed by Dr. Burt Knack and [facility]  S/P pacemaker  On eliquis anticoagulation.    Elevated Cholesterol: Due for re-eval on statin Using medications without problems: none Muscle aches:  none Diet compliance: moderate Exercise: regular 7 days a week Other complaints:  Hypertension:    BP Readings from Last 3 Encounters:  10/30/21 118/74  10/21/21 118/61  09/14/21 112/64   Using medication without problems or lightheadedness:  none Chest pain with exertion: none Edema:none Short of breath: good, stable Average home BPs: Other issues:  Vit D: due    Anemia: followed by Dr. 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 Minimal risk of multiple myeloma.. released from  Oncologist.   Neuropathy followed by neurology   DISH: diffuse.. Pain in right knee, hands, back, ankles  Followed by Rheum (Dr. Marijean Bravo). Is considering PT  Repeat.  Exercising daily.   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 Minimal symptoms, no longer on pulmicort. Rarely using albuterol. Only issue is when he gets lung infection.. Needs inhaler.   OSA on CPAP.    Hx of colon cancer 2000  Relevant past  medical, surgical, family and social history reviewed and updated as indicated. Interim medical history since our last visit reviewed. Allergies and medications reviewed and updated. Outpatient Medications Prior to Visit  Medication Sig Dispense Refill   albuterol (VENTOLIN HFA) 108 (90 Base) MCG/ACT inhaler Inhale 2 puffs into the lungs every 6 (six) hours as needed for wheezing or shortness of breath. 8 g 2   amLODipine (NORVASC) 5 MG tablet Take 1 tablet by mouth once daily 90 tablet 2   apixaban (ELIQUIS) 5 MG TABS tablet Take 1 tablet by mouth twice daily 180 tablet 1   bimatoprost (LUMIGAN) 0.01 % SOLN Place 1 drop into both eyes at bedtime.     Cholecalciferol 25 MCG (1000 UT) tablet Take 1,000 Units by mouth daily.      diclofenac Sodium (VOLTAREN) 1 % GEL Apply 2 g topically 4 (four) times daily as needed.     dorzolamide-timolol (COSOPT) 22.3-6.8 MG/ML ophthalmic solution Place 1 drop into both eyes 2 (two) times daily.      dronedarone (MULTAQ) 400 MG tablet Take 1 tablet (400 mg total) by mouth 2 (two) times daily with a meal. 180 tablet 2   finasteride (PROSCAR) 5 MG tablet Take 5 mg by mouth daily after breakfast.      fluticasone (FLONASE) 50 MCG/ACT nasal spray Place 2 sprays into both nostrils 2 (two) times daily.  Fluticasone Propionate, Inhal, (FLOVENT DISKUS) 100 MCG/BLIST AEPB Inhale 1 Inhaler into the lungs 2 (two) times daily. 60 each 2   metoprolol succinate (TOPROL-XL) 25 MG 24 hr tablet TAKE 1 TABLET BY MOUTH AT BEDTIME 90 tablet 2   Multiple Vitamin (MULTIVITAMIN WITH MINERALS) TABS tablet Take 1 tablet by mouth daily.     pantoprazole (PROTONIX) 40 MG tablet Take 1 tablet by mouth once daily 90 tablet 2   Polyethyl Glycol-Propyl Glycol (SYSTANE OP) Place 1 drop into both eyes daily as needed (dry eyes).     rosuvastatin (CRESTOR) 20 MG tablet Take 1 tablet by mouth once daily 90 tablet 2   tadalafil (CIALIS) 5 MG tablet Take 5 mg by mouth at bedtime.       triamterene-hydrochlorothiazide (MAXZIDE-25) 37.5-25 MG tablet Take 1 tablet by mouth once daily 90 tablet 2   NONFORMULARY OR COMPOUNDED ITEM Neuropathy cream transdermal therapeutics     No facility-administered medications prior to visit.     Per HPI unless specifically indicated in ROS section below Review of Systems  Constitutional:  Negative for fatigue and fever.  HENT:  Negative for ear pain.   Eyes:  Negative for pain.  Respiratory:  Negative for cough and shortness of breath.   Cardiovascular:  Negative for chest pain, palpitations and leg swelling.  Gastrointestinal:  Negative for abdominal pain.  Genitourinary:  Negative for dysuria.  Musculoskeletal:  Negative for arthralgias.  Neurological:  Negative for syncope, light-headedness and headaches.  Psychiatric/Behavioral:  Negative for dysphoric mood.   Objective:  BP 118/74   Pulse 70   Temp 98 F (36.7 C) (Temporal)   Ht 5' 7.5" (1.715 m)   Wt 175 lb 2 oz (79.4 kg)   SpO2 96%   BMI 27.02 kg/m   Wt Readings from Last 3 Encounters:  10/30/21 175 lb 2 oz (79.4 kg)  10/22/21 175 lb (79.4 kg)  10/21/21 175 lb (79.4 kg)      Physical Exam Constitutional:      Appearance: He is well-developed.  HENT:     Head: Normocephalic.     Right Ear: Hearing normal.     Left Ear: Hearing normal.     Nose: Nose normal.  Neck:     Thyroid: No thyroid mass or thyromegaly.     Vascular: No carotid bruit.     Trachea: Trachea normal.  Cardiovascular:     Rate and Rhythm: Normal rate and regular rhythm.     Pulses: Normal pulses.     Heart sounds: Heart sounds not distant. No murmur heard.   No friction rub. No gallop.     Comments: No peripheral edema Pulmonary:     Effort: Pulmonary effort is normal. No respiratory distress.     Breath sounds: Normal breath sounds.  Skin:    General: Skin is warm and dry.     Findings: No rash.  Psychiatric:        Speech: Speech normal.        Behavior: Behavior normal.         Thought Content: Thought content normal.      Results for orders placed or performed in visit on 09/21/21  CUP PACEART REMOTE DEVICE CHECK  Result Value Ref Range   Date Time Interrogation Session [id number]    Pulse Generator Manufacturer MERM    Pulse Gen Model [id number] Advisa DR MRI    Pulse Gen Serial Number [id number]    Clinic Name [facility]  Implantable Pulse Generator Type Implantable Pulse Generator    Implantable Pulse Generator Implant Date [id number]    Implantable Lead Manufacturer MERM    Implantable Lead Model 5076 CapSureFix Novus MRI SureScan    Implantable Lead Serial Number [id number]    Implantable Lead Implant Date 94801655    Implantable Lead Location Detail 1 APPENDAGE    Implantable Lead Location [id number]    Implantable Lead Manufacturer MERM    Implantable Lead Model 5076 CapSureFix Novus MRI SureScan    Implantable Lead Serial Number [id number]    Implantable Lead Implant Date [id number]    Implantable Lead Location Detail 1 APEX    Implantable Lead Location [phone]    Lead Channel Setting Sensing Sensitivity 2.8 mV   Lead Channel Setting Pacing Amplitude 2 V   Lead Channel Setting Pacing Pulse Width 0.4 ms   Lead Channel Setting Pacing Amplitude 2.5 V   Lead Channel Impedance Value 418 ohm   Lead Channel Impedance Value 361 ohm   Lead Channel Sensing Intrinsic Amplitude 1.5 mV   Lead Channel Sensing Intrinsic Amplitude 1.5 mV   Lead Channel Pacing Threshold Amplitude 0.625 V   Lead Channel Pacing Threshold Pulse Width 0.4 ms   Lead Channel Impedance Value 589 ohm   Lead Channel Impedance Value 475 ohm   Lead Channel Sensing Intrinsic Amplitude 5.75 mV   Lead Channel Sensing Intrinsic Amplitude 5.75 mV   Lead Channel Pacing Threshold Amplitude 0.875 V   Lead Channel Pacing Threshold Pulse Width 0.4 ms   Battery Status OK    Battery Remaining Longevity 39 mo   Battery Voltage 2.97 V   Brady Statistic RA Percent Paced 95.94 %   Brady Statistic RV  Percent Paced 99.89 %   Brady Statistic AP VP Percent 99.15 %   Brady Statistic AS VP Percent 0.83 %   Brady Statistic AP VS Percent 0.02 %   Brady Statistic AS VS Percent 0 %    This visit occurred during the SARS-CoV-2 public health emergency.  Safety protocols were in place, including screening questions prior to the visit, additional usage of staff PPE, and extensive cleaning of exam room while observing appropriate contact time as indicated for disinfecting solutions.   COVID 19 screen:  No recent travel or known exposure to COVID19 The patient denies respiratory symptoms of COVID 19 at this time. The importance of social distancing was discussed today.   Assessment and Plan   The patient's preventative maintenance and recommended screening tests for an annual wellness exam were reviewed in full today. Brought up to date unless services declined.  Counselled on the importance of diet, exercise, and its role in overall health and mortality. The patient's FH and SH was reviewed, including their home life, tobacco status, and drug and alcohol status.   Smoking Status: nonsmoker ETOH/ drug use:  minimal/ none Vaccines: TDap, PNA uptodate, S/P COVID x 5, flu, PNA  20 every 5 years repeat given sarcoid Prostate Cancer Screen: BPH, hx of biopsy. PSA Followed by Dr. Alinda Money Colon Cancer Screen:  Dr. Hilarie Fredrickson Hx of colon cancer 11/2020 Colonoscopy on 3 year recall.. Hep C:  neg.  Problem List Items Addressed This Visit     CIDP (chronic inflammatory demyelinating polyneuropathy) ([place])     followed by neurology      DISH (diffuse idiopathic skeletal hyperostosis)    Followed by Rheum (Dr. Marijean Bravo). Is considering PT  Repeat.  Exercising daily.      Essential hypertension, benign  Stable, chronic.  Continue current medication.         Hyperlipidemia with target LDL less than 100    Due for re-eval.      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 Alvy Bimler      Paroxysmal atrial fibrillation [facility])    Followed by Dr. Burt Knack and Dr.Camnitz  S/P pacemaker  On eliquis anticoagulation.      Vitamin D deficiency   Relevant Orders   VITAMIN D 25 Hydroxy (Vit-D Deficiency, Fractures) (Completed)     [name], MD

## 2021-10-31 LAB — CBC WITH DIFFERENTIAL/PLATELET
Absolute Monocytes: 621 cells/uL (ref 200–950)
Basophils Absolute: 38 cells/uL (ref 0–200)
Basophils Relative: 0.6 %
Eosinophils Absolute: 198 cells/uL (ref 15–500)
Eosinophils Relative: 3.1 %
HCT: 43 % (ref 38.5–50.0)
Hemoglobin: 14.9 g/dL (ref 13.2–17.1)
Lymphs Abs: 973 cells/uL (ref 850–3900)
MCH: 31.7 pg (ref 27.0–33.0)
MCHC: 34.7 g/dL (ref 32.0–36.0)
MCV: 91.5 fL (ref 80.0–100.0)
MPV: 9.7 fL (ref 7.5–12.5)
Monocytes Relative: 9.7 %
Neutro Abs: 4570 cells/uL (ref 1500–7800)
Neutrophils Relative %: 71.4 %
Platelets: 193 10*3/uL (ref 140–400)
RBC: 4.7 10*6/uL (ref 4.20–5.80)
RDW: 12.6 % (ref 11.0–15.0)
Total Lymphocyte: 15.2 %
WBC: 6.4 10*3/uL (ref 3.8–10.8)

## 2021-10-31 LAB — COMPREHENSIVE METABOLIC PANEL
AG Ratio: 1.5 (calc) (ref 1.0–2.5)
ALT: 15 U/L (ref 9–46)
AST: 20 U/L (ref 10–35)
Albumin: 4.4 g/dL (ref 3.6–5.1)
Alkaline phosphatase (APISO): 61 U/L (ref 35–144)
BUN: 16 mg/dL (ref 7–25)
CO2: 30 mmol/L (ref 20–32)
Calcium: 11 mg/dL — ABNORMAL HIGH (ref 8.6–10.3)
Chloride: 99 mmol/L (ref 98–110)
Creat: 1.07 mg/dL (ref 0.70–1.28)
Globulin: 2.9 g/dL (calc) (ref 1.9–3.7)
Glucose, Bld: 86 mg/dL (ref 65–99)
Potassium: 4.3 mmol/L (ref 3.5–5.3)
Sodium: 138 mmol/L (ref 135–146)
Total Bilirubin: 1.1 mg/dL (ref 0.2–1.2)
Total Protein: 7.3 g/dL (ref 6.1–8.1)

## 2021-10-31 LAB — LIPID PANEL
Cholesterol: 173 mg/dL (ref <200)
HDL: 58 mg/dL (ref >=40)
LDL Cholesterol (Calc): 99 mg/dL (calc)
Non-HDL Cholesterol (Calc): 115 mg/dL (calc) (ref <130)
Total CHOL/HDL Ratio: 3 (calc) (ref <5.0)
Triglycerides: 74 mg/dL (ref <150)

## 2021-10-31 LAB — VITAMIN D 25 HYDROXY (VIT D DEFICIENCY, FRACTURES): Vit D, 25-Hydroxy: 41 ng/mL (ref 30–100)

## 2021-10-31 LAB — FERRITIN: Ferritin: 244 ng/mL (ref 24–380)

## 2021-10-31 LAB — IRON, TOTAL/TOTAL IRON BINDING CAP
%SAT: 27 % (calc) (ref 20–48)
Iron: 86 ug/dL (ref 50–180)
TIBC: 324 mcg/dL (calc) (ref 250–425)

## 2021-11-03 ENCOUNTER — Telehealth: Payer: Self-pay

## 2021-11-03 DIAGNOSIS — R972 Elevated prostate specific antigen [PSA]: Secondary | ICD-10-CM | POA: Diagnosis not present

## 2021-11-03 NOTE — Telephone Encounter (Signed)
**Note De-identified  Obfuscation** ERROR

## 2021-11-03 NOTE — Telephone Encounter (Deleted)
**Note De-Identified  Obfuscation** I called [facility] andwas given the following information from the pts ins cardneeded to do this [place]. [employer] ID: [id number] BIN: [id number] No PCN GRP: [id number]  I started a [place] PA through covermymeds. [id number]

## 2021-11-04 DIAGNOSIS — Z85828 Personal history of other malignant neoplasm of skin: Secondary | ICD-10-CM | POA: Diagnosis not present

## 2021-11-04 DIAGNOSIS — D225 Melanocytic nevi of trunk: Secondary | ICD-10-CM | POA: Diagnosis not present

## 2021-11-04 DIAGNOSIS — B353 Tinea pedis: Secondary | ICD-10-CM | POA: Diagnosis not present

## 2021-11-04 DIAGNOSIS — L821 Other seborrheic keratosis: Secondary | ICD-10-CM | POA: Diagnosis not present

## 2021-11-04 DIAGNOSIS — Z23 Encounter for immunization: Secondary | ICD-10-CM | POA: Diagnosis not present

## 2021-11-04 DIAGNOSIS — L578 Other skin changes due to chronic exposure to nonionizing radiation: Secondary | ICD-10-CM | POA: Diagnosis not present

## 2021-11-04 DIAGNOSIS — L814 Other melanin hyperpigmentation: Secondary | ICD-10-CM | POA: Diagnosis not present

## 2021-11-09 ENCOUNTER — Telehealth: Payer: Self-pay | Admitting: Family Medicine

## 2021-11-09 NOTE — Telephone Encounter (Signed)
Pt returning your call

## 2021-11-10 ENCOUNTER — Other Ambulatory Visit: Payer: Self-pay | Admitting: Family Medicine

## 2021-11-10 NOTE — Telephone Encounter (Signed)
Spoke with Derrick [name].  He states he was returning my call.  He reviewed his lab results on MyChart and replied back to Dr. Rometta Emery message about the elevated calcium.  I advised that his message has been forwarded to Dr. [name].  He was thinking that 2 weeks prior to having his lab work done, that he had Covid and is wondering if the elevated calcium level could be a sarcoidosis response for Covid causing his calcium level to be elevated.

## 2021-11-10 NOTE — Telephone Encounter (Signed)
Mr. Caso notified as instructed by telephone.  He is not taking any calcium supplement.  He states he take Vit D3.  Lab appointment scheduled for 01/11/2022 at 8:15 am to repeat Calcium level.

## 2021-11-10 NOTE — Progress Notes (Signed)
Calcium lab ordered

## 2021-11-10 NOTE — Telephone Encounter (Signed)
Left message for Mr. Northrup to return my call.

## 2021-11-10 NOTE — Telephone Encounter (Signed)
I do think that may be a possibility as a cause of increased calcium.  Make sure he is not taking any calcium.  Have him scheduled to recheck Ca in 2 months.

## 2021-11-11 DIAGNOSIS — R3912 Poor urinary stream: Secondary | ICD-10-CM | POA: Diagnosis not present

## 2021-11-11 DIAGNOSIS — N401 Enlarged prostate with lower urinary tract symptoms: Secondary | ICD-10-CM | POA: Diagnosis not present

## 2021-11-11 DIAGNOSIS — R972 Elevated prostate specific antigen [PSA]: Secondary | ICD-10-CM | POA: Diagnosis not present

## 2021-11-11 DIAGNOSIS — N5201 Erectile dysfunction due to arterial insufficiency: Secondary | ICD-10-CM | POA: Diagnosis not present

## 2021-11-16 ENCOUNTER — Ambulatory Visit (INDEPENDENT_AMBULATORY_CARE_PROVIDER_SITE_OTHER): Payer: Medicare Other | Admitting: Adult Health

## 2021-11-16 ENCOUNTER — Encounter: Payer: Self-pay | Admitting: Adult Health

## 2021-11-16 ENCOUNTER — Encounter: Payer: Self-pay | Admitting: Family Medicine

## 2021-11-16 VITALS — BP 111/67 | HR 69 | Ht 69.0 in | Wt 183.2 lb

## 2021-11-16 DIAGNOSIS — D472 Monoclonal gammopathy: Secondary | ICD-10-CM

## 2021-11-16 DIAGNOSIS — Z9989 Dependence on other enabling machines and devices: Secondary | ICD-10-CM

## 2021-11-16 DIAGNOSIS — I2510 Atherosclerotic heart disease of native coronary artery without angina pectoris: Secondary | ICD-10-CM | POA: Diagnosis not present

## 2021-11-16 DIAGNOSIS — G4733 Obstructive sleep apnea (adult) (pediatric): Secondary | ICD-10-CM | POA: Diagnosis not present

## 2021-11-16 DIAGNOSIS — G63 Polyneuropathy in diseases classified elsewhere: Secondary | ICD-10-CM | POA: Diagnosis not present

## 2021-11-16 MED ORDER — FLOVENT DISKUS 100 MCG/ACT IN AEPB: 1.0000 | INHALATION_SPRAY | Freq: Two times a day (BID) | RESPIRATORY_TRACT | 2 refills | Status: DC | PRN

## 2021-11-16 NOTE — Progress Notes (Signed)
PATIENT: Derrick [name] DOB: [birth year]  REASON FOR VISIT: follow up HISTORY FROM: patient   HISTORY OF PRESENT ILLNESS: Today 11/16/21 [name] is a 80 y.o. male with a history of OSA on CPAP. His download attached in a separate note below shows excellent compliance with his CPAP. He is wearing it 100%of the time and for greater than 4 hours. His pressure setting is set on auto titration 5-15 cmH2O and he has a good treatment of his AHI 2.5.  He does have a small leak in the 95th percentile of 18.8 L/min, but he does not complain of feeling a leak and says it is likely positional.   His neuropathy has not improved, he is still having burning tingling and cramping in his feet. He states that he does not wish to start medications, he has been trying Voltaren gel for symptom management but doesn't find this very helpful. Patient reports tripping often and had a recent fall without injury. He reports that he uses a walker at times when he is tired. Encouraged patient to consider physical therapy to improve gait and decrease his falls.  HISTORY  11/10/20:   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   His download indicates that he uses his machine nightly for compliance of 100%.  He uses machine greater than 4 hours each night.  On average he uses his machine 10 hours and 29 minutes.  Residual AHI is 2.6 on 5 to 15 cm of water with EPR 3.  Leak in the 95th percentile is 11.3.   He reports that he has burning and tingling in the legs pretty consistently.  He states that he was on gabapentin in the past but did not like the cognitive side effects.  He would rather not be on oral medication.  He states if he stands for a long period of time the numbness worsens and he feels like he may lose his balance.  He has not had any falls.  He does have a cane that he uses if needed.  He had a total knee replacement in March on the right.   He states that it has been a very painful experience and still experiences discomfort.  [name]    REVIEW OF SYSTEMS: Out of a complete 14 system review of symptoms, the patient complains only of the following symptoms, and all other reviewed systems are negative. -Numbness, tingling, and burning of the feet.  - Numbness in the hands secondary to carpal tunnel  FSS ESS 2  ALLERGIES: Allergies  Allergen Reactions   Nsaids     Contraindicated due to being on Eliquis.    HOME MEDICATIONS: Outpatient Medications Prior to Visit  Medication Sig Dispense Refill   albuterol (VENTOLIN HFA) 108 (90 Base) MCG/ACT inhaler Inhale 2 puffs into the lungs every 6 (six) hours as needed for wheezing or shortness of breath. 8 g 2   amLODipine (NORVASC) 5 MG tablet Take 1 tablet by mouth once daily 90 tablet 2   apixaban (ELIQUIS) 5 MG TABS tablet Take 1 tablet by mouth twice daily 180 tablet 1   bimatoprost (LUMIGAN) 0.01 % SOLN Place 1 drop into both eyes at bedtime.     Cholecalciferol 25 MCG (1000 UT) tablet Take 1,000 Units by mouth daily.      diclofenac Sodium (VOLTAREN) 1 % GEL Apply 2 g topically 4 (four) times daily as needed.  dorzolamide-timolol (COSOPT) 22.3-6.8 MG/ML ophthalmic solution Place 1 drop into both eyes 2 (two) times daily.      dronedarone (MULTAQ) 400 MG tablet Take 1 tablet (400 mg total) by mouth 2 (two) times daily with a meal. 180 tablet 2   finasteride (PROSCAR) 5 MG tablet Take 5 mg by mouth daily after breakfast.      fluticasone (FLONASE) 50 MCG/ACT nasal spray Place 2 sprays into both nostrils 2 (two) times daily.     Fluticasone Propionate, Inhal, (FLOVENT DISKUS) 100 MCG/BLIST AEPB Inhale 1 Inhaler into the lungs 2 (two) times daily. 60 each 2   metoprolol succinate (TOPROL-XL) 25 MG 24 hr tablet TAKE 1 TABLET BY MOUTH AT BEDTIME 90 tablet 2   Multiple Vitamin (MULTIVITAMIN WITH MINERALS) TABS tablet Take 1 tablet by mouth daily.     pantoprazole (PROTONIX) 40 MG  tablet Take 1 tablet by mouth once daily 90 tablet 2   Polyethyl Glycol-Propyl Glycol (SYSTANE OP) Place 1 drop into both eyes daily as needed (dry eyes).     rosuvastatin (CRESTOR) 20 MG tablet Take 1 tablet by mouth once daily 90 tablet 2   tadalafil (CIALIS) 5 MG tablet Take 5 mg by mouth at bedtime.      triamterene-hydrochlorothiazide (MAXZIDE-25) 37.5-25 MG tablet Take 1 tablet by mouth once daily 90 tablet 2   No facility-administered medications prior to visit.    PAST MEDICAL HISTORY: Past Medical History:  Diagnosis Date   Anemia    iron deficiency anemia- infusion on 02/15/2020 and 02/22/2020   BENIGN PROSTATIC HYPERTROPHY, WITH OBSTRUCTION 05/28/2010   CAD (coronary artery disease)    a. s/p NSTEMI 06/01/11: DES to RCA;  b. cath 06/25/11:   dLM 50-60% (FFR 0.87), prox to mid LAD 40-50%, D1 50%, pCFX 50%, RCA stent ok, dPDA 80%, EF 55-60%.  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/2014   DISH (diffuse idiopathic skeletal hyperostosis) 12/02/2015   Diverticulitis    GENERALIZED OSTEOARTHROSIS UNSPECIFIED SITE 05/28/2010   GERD 05/28/2010   Heart murmur    History of gout    History of kidney stones    HYPERLIPIDEMIA 05/28/2010   Hypertension    IgM lambda paraproteinemia    Intrinsic asthma, 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   "associated w/lung sarcoidosis"   Malignant neoplasm of descending colon ([place]) 05/28/2010   MITRAL VALVE PROLAPSE 05/28/2010   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   NSTEMI  (non-ST elevated myocardial infarction) ([place]) 06/03/11   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   OSA on CPAP 05/28/2010   PALPITATIONS, CHRONIC 05/28/2010   Parotid gland pain 2010   infection   Peripheral neuropathy    "tx'd w/targeted chemo" (05/12/2016)   Pneumonia 2-3 times   Presence of permanent cardiac pacemaker    [employer]   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   Sarcoidosis    pulmonalis   UNSPECIFIED INFLAMMATORY AND TOXIC NEUROPATHY 05/28/2010   Vision loss    VITAMIN D DEFICIENCY 05/28/2010    PAST SURGICAL  HISTORY: Past Surgical History:  Procedure Laterality Date   APPENDECTOMY  57   age 52   CATARACT EXTRACTION W/ INTRAOCULAR LENS IMPLANT Left    COLON SURGERY  2000   decending colon    CORONARY ANGIOPLASTY WITH STENT PLACEMENT  06/03/11   CORONARY ARTERY BYPASS GRAFT N/A 06/05/2019   Procedure: CORONARY ARTERY BYPASS GRAFTING (CABG) TIMES THREE USING LEFT INTERNAL MAMMARY ARTERY AND RIGHT GREATER SEPHANOUS VEIN;  Surgeon: [name], MD;  Location: [facility];  Service: Open Heart Surgery;  Laterality: N/A;   ELECTROPHYSIOLOGIC STUDY N/A 05/12/2016   Procedure: Cardioversion;  Surgeon: [name], MD;  Location: [facility];  Service: Cardiovascular;  Laterality: N/A;   EP IMPLANTABLE DEVICE N/A 05/12/2016   Procedure: Pacemaker Implant;  Surgeon: [name], MD;  Location: [facility];  Service: Cardiovascular;  Laterality: N/A;   EYE SURGERY     bilateral cataract with lens implant   HERNIA REPAIR  01/2019   left inguinal hernia repair   INGUINAL HERNIA REPAIR Left 02/21/2019   Procedure: LEFT INGUINAL HERNIA REPAIR WITH MESH;  Surgeon: [name], MD;  Location: [facility];  Service: General;  Laterality: Left;   INSERT / REPLACE / REMOVE PACEMAKER  05/12/2016   INTRAVASCULAR PRESSURE WIRE/FFR STUDY N/A 05/15/2019   Procedure: INTRAVASCULAR PRESSURE WIRE/FFR STUDY;  Surgeon: [name], MD;  Location: [facility];  Service:  Cardiovascular;  Laterality: N/A;   KNEE ARTHROSCOPY Right 2003   LEFT HEART CATH AND CORONARY ANGIOGRAPHY N/A 05/15/2019   Procedure: LEFT HEART CATH AND CORONARY ANGIOGRAPHY;  Surgeon: [name], MD;  Location: [facility];  Service: Cardiovascular;  Laterality: N/A;   LEFT HEART CATHETERIZATION WITH CORONARY ANGIOGRAM N/A 03/31/2015   Procedure: LEFT HEART CATHETERIZATION WITH CORONARY ANGIOGRAM;  Surgeon: [name], MD;  Location: [facility] C[facility];  Service: Cardiovascular;  Laterality: N/A;   LUNG SURGERY  2001   "open lung dissection"   MOHS SURGERY Left ~ 2008   ear   PROSTATE BIOPSY  2010   TEE WITHOUT CARDIOVERSION N/A 06/05/2019   Procedure: TRANSESOPHAGEAL ECHOCARDIOGRAM (TEE);  Surgeon: [name], [name], MD;  Location: [facility];  Service: Open Heart Surgery;  Laterality: N/A;   TONSILLECTOMY  1950   TOTAL KNEE ARTHROPLASTY Right 03/11/2020   Procedure: TOTAL KNEE ARTHROPLASTY;  Surgeon: [name], MD;  Location: [facility];  Service: Orthopedics;  Laterality: Right;  70 mins    FAMILY HISTORY: Family History  Problem Relation Age of Onset   Arthritis Mother        severe   Coronary artery disease Mother    Colon polyps Mother    Heart disease Mother    Sleep apnea Mother    Sleep apnea Father    Coronary artery disease Brother    Cancer Brother        choriocarcinoma   Arrhythmia Brother 6       Afib/Tachycardia   Heart attack Brother    Congestive Heart Failure Brother    Sleep apnea Brother     SOCIAL HISTORY: Social History   Socioeconomic History   Marital status: Married    Spouse name: Not on file   Number of children: 2   Years of education: MS-Coll.   Highest education level: Not on file  Occupational History   Occupation: Consultant    Comment: Building services engineer  Tobacco Use   Smoking status: Never   Smokeless tobacco: Never  Vaping Use  Vaping Use: Never used  Substance and Sexual Activity   Alcohol use: Yes    Comment:  occasional   Drug use: No   Sexual activity: Yes    Birth control/protection: None  Other Topics Concern   Not on file  Social History Narrative   College- [employer]; [employer]-environment science and mgt. Married [name])  "43" 1 son- 78; 1 dtr "9" 2 grandchildren. 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 End of life-provided discussive context and provided packet.   Social Determinants of Health   Financial Resource Strain: Low Risk    Difficulty of Paying Living Expenses: Not hard at all  Food Insecurity: No Food Insecurity   Worried About Charity fundraiser in the Last Year: Never true   [place] in the Last Year: Never true  Transportation Needs: No Transportation Needs   Lack of Transportation (Medical): No   Lack of Transportation (Non-Medical): No  Physical Activity: Sufficiently Active   Days of Exercise per Week: 7 days   Minutes of Exercise per Session: 50 min  Stress: No Stress Concern Present   Feeling of Stress : Not at all  Social Connections: Moderately Integrated   Frequency of Communication with Friends and Family: More than three times a week   Frequency of Social Gatherings with Friends and Family: More than three times a week   Attends Religious Services: More than 4 times per year   Active Member of [employer] or Organizations: No   Attends Archivist Meetings: Never   Marital Status: Married  Human resources officer Violence: Not At Risk   Fear of Current or Ex-Partner: No   Emotionally Abused: No   Physically Abused: No   Sexually Abused: No      PHYSICAL EXAM  Vitals:   11/16/21 0827  BP: 111/67  Pulse: 69  Weight: 83.1 kg  Height: 5\' 9"  (1.753 m)   Body mass index is 27.05 kg/m.  Generalized: Well developed, in no acute distress  Chest: Lungs clear to auscultation bilaterally  Neurological examination  Mentation: Alert oriented to time, place, history taking. Follows all commands speech and language fluent Cranial nerve  II-XII: Extraocular movements were full, visual field were full on confrontational test Head turning and shoulder shrug  were normal and symmetric. Motor: The motor testing reveals 4 over 5 strength of all 4 extremities. Good symmetric motor tone is noted throughout.  Sensory: Sensory testing is intact to soft touch on all 4 extremities. No evidence of extinction is noted.  Gait and station: Gait is wide and slightly unsteady. Tandem gait is not attempted at this time.    DIAGNOSTIC DATA (LABS, IMAGING, TESTING) - I reviewed patient records, labs, notes, testing and imaging myself where available.  Lab Results  Component Value Date   WBC 6.4 10/30/2021   HGB 14.9 10/30/2021   HCT 43.0 10/30/2021   MCV 91.5 10/30/2021   PLT 193 10/30/2021      Component Value Date/Time   NA 138 10/30/2021 1557   NA 141 05/29/2018 0806   NA 141 07/27/2016 0748   K 4.3 10/30/2021 1557   K 3.9 07/27/2016 0748   CL 99 10/30/2021 1557   CL 104 03/23/2013 0855   CO2 30 10/30/2021 1557   CO2 30 (H) 07/27/2016 0748   GLUCOSE 86 10/30/2021 1557   GLUCOSE 122 07/27/2016 0748   GLUCOSE 98 03/23/2013 0855   BUN 16 10/30/2021 1557   BUN 14 05/29/2018 0806   BUN  16.0 07/27/2016 0748   CREATININE 1.07 10/30/2021 1557   CREATININE 1.0 07/27/2016 0748   CALCIUM 11.0 (H) 10/30/2021 1557   CALCIUM 10.5 (H) 07/27/2016 0748   PROT 7.3 10/30/2021 1557   PROT 6.6 05/21/2020 0721   PROT 7.5 07/27/2016 0748   ALBUMIN 4.3 10/22/2020 0823   ALBUMIN 4.3 05/21/2020 0721   ALBUMIN 4.3 07/27/2016 0748   AST 20 10/30/2021 1557   AST 24 07/27/2016 0748   ALT 15 10/30/2021 1557   ALT 19 07/27/2016 0748   ALKPHOS 49 10/22/2020 0823   ALKPHOS 53 07/27/2016 0748   BILITOT 1.1 10/30/2021 1557   BILITOT 0.4 05/21/2020 0721   BILITOT 0.84 07/27/2016 0748   GFRNONAA >60 04/14/2020 1221   GFRAA >60 04/14/2020 1221   Lab Results  Component Value Date   CHOL 173 10/30/2021   HDL 58 10/30/2021   LDLCALC 99 10/30/2021    TRIG 74 10/30/2021   CHOLHDL 3.0 10/30/2021   Lab Results  Component Value Date   HGBA1C 5.0 02/01/2020   Lab Results  Component Value Date   [id number] 573 06/18/2020   Lab Results  Component Value Date   TSH 2.06 06/18/2020      ASSESSMENT AND PLAN 80 y.o. year old male  has a past medical history of Anemia, BENIGN PROSTATIC HYPERTROPHY, WITH OBSTRUCTION (05/28/2010), CAD (coronary artery disease), Chronic back pain, CIDP (chronic inflammatory demyelinating polyneuropathy) ([place]) (04/11/2012), Colon cancer ([place]) (dx'd 2000), COLONIC POLYPS, ADENOMATOUS, HX OF (01/28/253), Complication of anesthesia, COPD (chronic obstructive pulmonary disease) ([place]), Diffuse axonal neuropathy (10/16/2014), DISH (diffuse idiopathic skeletal hyperostosis) (12/02/2015), Diverticulitis, GENERALIZED OSTEOARTHROSIS UNSPECIFIED SITE (05/28/2010), GERD (05/28/2010), Heart murmur, History of gout, History of kidney stones, HYPERLIPIDEMIA (05/28/2010), Hypertension, IgM lambda paraproteinemia, Intrinsic asthma, unspecified (05/28/2010), Malignant neoplasm of descending colon ([place]) (05/28/2010), MITRAL VALVE PROLAPSE (05/28/2010), Monoclonal gammopathy of undetermined significance (12/11/2011), NSTEMI (non-ST elevated myocardial infarction) ([place]) (06/03/11), Open-angle glaucoma of both eyes (12/2012), OSA on CPAP (05/28/2010), PALPITATIONS, CHRONIC (05/28/2010), Parotid gland pain (2010), Peripheral neuropathy, Pneumonia (2-3 times), Presence of permanent cardiac pacemaker, 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), Sarcoidosis, UNSPECIFIED INFLAMMATORY AND TOXIC NEUROPATHY (05/28/2010), Vision loss, and VITAMIN D DEFICIENCY (05/28/2010). here with:  OSA on CPAP  - CPAP compliance excellent - Good treatment of AHI  - Encourage patient to use CPAP nightly and > 4 hours each night   2. Neuropathy  - He doesn't wish to try any medication at this time - He will consider physical therapy and let us know is he wishes to proceed.  - F/U in 1 year or  sooner if needed    Ward Givens, MSN, NP-C 11/16/2021, 8:50 AM [facility] [address], [place], [place] [address] [phone]

## 2021-11-26 DIAGNOSIS — U071 COVID-19: Secondary | ICD-10-CM | POA: Diagnosis not present

## 2021-12-14 NOTE — Assessment & Plan Note (Signed)
Stable, chronic.  Continue current medication.    

## 2021-12-14 NOTE — Assessment & Plan Note (Signed)
Followed by Rheum (Dr. Marijean Bravo). Is considering PT  Repeat.  Exercising daily.

## 2021-12-14 NOTE — Assessment & Plan Note (Signed)
followed by neurology

## 2021-12-14 NOTE — Assessment & Plan Note (Signed)
followed by Dr. Alvy Bimler

## 2021-12-14 NOTE — Assessment & Plan Note (Signed)
Followed by Dr. Burt Knack and [facility]  S/P pacemaker  On eliquis anticoagulation.

## 2021-12-14 NOTE — Assessment & Plan Note (Signed)
Due for re-eval. 

## 2021-12-22 ENCOUNTER — Ambulatory Visit (INDEPENDENT_AMBULATORY_CARE_PROVIDER_SITE_OTHER): Payer: Medicare Other

## 2021-12-22 DIAGNOSIS — I495 Sick sinus syndrome: Secondary | ICD-10-CM

## 2021-12-22 LAB — CUP PACEART REMOTE DEVICE CHECK
Battery Remaining Longevity: 42 mo
Battery Voltage: 2.97 V
Brady Statistic AP VP Percent: 99.07 %
Brady Statistic AP VS Percent: 0.01 %
Brady Statistic AS VP Percent: 0.92 %
Brady Statistic AS VS Percent: 0 %
Brady Statistic RA Percent Paced: 89.52 %
Brady Statistic RV Percent Paced: 99.91 %
Date Time Interrogation Session: 20221227164913
Implantable Lead Implant Date: 20170517
Implantable Lead Implant Date: 20170517
Implantable Lead Location: 753859
Implantable Lead Location: 753860
Implantable Lead Model: 5076
Implantable Lead Model: 5076
Implantable Pulse Generator Implant Date: 20170517
Lead Channel Impedance Value: 361 Ohm
Lead Channel Impedance Value: 418 Ohm
Lead Channel Impedance Value: 494 Ohm
Lead Channel Impedance Value: 608 Ohm
Lead Channel Pacing Threshold Amplitude: 0.625 V
Lead Channel Pacing Threshold Amplitude: 0.75 V
Lead Channel Pacing Threshold Pulse Width: 0.4 ms
Lead Channel Pacing Threshold Pulse Width: 0.4 ms
Lead Channel Sensing Intrinsic Amplitude: 1.75 mV
Lead Channel Sensing Intrinsic Amplitude: 1.75 mV
Lead Channel Sensing Intrinsic Amplitude: 5.875 mV
Lead Channel Sensing Intrinsic Amplitude: 5.875 mV
Lead Channel Setting Pacing Amplitude: 2 V
Lead Channel Setting Pacing Amplitude: 2.5 V
Lead Channel Setting Pacing Pulse Width: 0.4 ms
Lead Channel Setting Sensing Sensitivity: 2.8 mV

## 2021-12-30 NOTE — Progress Notes (Signed)
Remote pacemaker transmission.   

## 2021-12-31 ENCOUNTER — Telehealth: Payer: Self-pay

## 2021-12-31 NOTE — Chronic Care Management (AMB) (Addendum)
° ° °  Chronic Care Management Pharmacy Assistant   Name: Derrick [name]  MRN: [id number] DOB: 11/10/1941   Reason for Encounter: CCM Reminder Call    Conditions to be addressed/monitored: CAD and HLD   Medications: Outpatient Encounter Medications as of 12/31/2021  Medication Sig   albuterol (VENTOLIN HFA) 108 (90 Base) MCG/ACT inhaler Inhale 2 puffs into the lungs every 6 (six) hours as needed for wheezing or shortness of breath.   amLODipine (NORVASC) 5 MG tablet Take 1 tablet by mouth once daily   apixaban (ELIQUIS) 5 MG TABS tablet Take 1 tablet by mouth twice daily   bimatoprost (LUMIGAN) 0.01 % SOLN Place 1 drop into both eyes at bedtime.   Cholecalciferol 25 MCG (1000 UT) tablet Take 1,000 Units by mouth daily.    diclofenac Sodium (VOLTAREN) 1 % GEL Apply 2 g topically 4 (four) times daily as needed.   dorzolamide-timolol (COSOPT) 22.3-6.8 MG/ML ophthalmic solution Place 1 drop into both eyes 2 (two) times daily.    dronedarone (MULTAQ) 400 MG tablet Take 1 tablet (400 mg total) by mouth 2 (two) times daily with a meal.   finasteride (PROSCAR) 5 MG tablet Take 5 mg by mouth daily after breakfast.    fluticasone (FLONASE) 50 MCG/ACT nasal spray Place 2 sprays into both nostrils 2 (two) times daily.   Fluticasone Propionate, Inhal, (FLOVENT DISKUS) 100 MCG/ACT AEPB Inhale 1 puff into the lungs 2 (two) times daily as needed.   metoprolol succinate (TOPROL-XL) 25 MG 24 hr tablet TAKE 1 TABLET BY MOUTH AT BEDTIME   Multiple Vitamin (MULTIVITAMIN WITH MINERALS) TABS tablet Take 1 tablet by mouth daily.   pantoprazole (PROTONIX) 40 MG tablet Take 1 tablet by mouth once daily   Polyethyl Glycol-Propyl Glycol (SYSTANE OP) Place 1 drop into both eyes daily as needed (dry eyes).   rosuvastatin (CRESTOR) 20 MG tablet Take 1 tablet by mouth once daily   tadalafil (CIALIS) 5 MG tablet Take 5 mg by mouth at bedtime.    triamterene-hydrochlorothiazide (MAXZIDE-25) 37.5-25 MG tablet Take 1  tablet by mouth once daily   No facility-administered encounter medications on file as of 12/31/2021.   Left voicemail for Derrick [name] to remind patient of upcoming telephone visit with Debbora Dus on 01/05/22 at 3:30pm. Patient was reminded to have all medications, supplements and any blood glucose and blood pressure readings available for review at appointment.   Star Rating Drugs: Medication:  Last Fill: Day Supply Rosuvastatin 20mg  08/26/21 [place], CPP notified  Avel Sensor, [place] Assistant [phone]  I have reviewed the care management and care coordination activities outlined in this encounter and I am certifying that I agree with the content of this note. No further action required.  Debbora Dus, PharmD Clinical Pharmacist [facility] at [facility] [phone]

## 2022-01-05 ENCOUNTER — Other Ambulatory Visit: Payer: Self-pay

## 2022-01-05 ENCOUNTER — Telehealth: Payer: Self-pay

## 2022-01-05 ENCOUNTER — Ambulatory Visit (INDEPENDENT_AMBULATORY_CARE_PROVIDER_SITE_OTHER): Payer: Medicare Other

## 2022-01-05 DIAGNOSIS — E785 Hyperlipidemia, unspecified: Secondary | ICD-10-CM

## 2022-01-05 DIAGNOSIS — I10 Essential (primary) hypertension: Secondary | ICD-10-CM

## 2022-01-05 DIAGNOSIS — I480 Paroxysmal atrial fibrillation: Secondary | ICD-10-CM

## 2022-01-05 NOTE — Progress Notes (Signed)
Chronic Care Management Pharmacy Note  01/05/2022 Name:  Derrick [name] MRN:  [id number] DOB:  10/06/1941  Summary: CCM follow up visit. No barriers to adherence identified. Chronic conditions stable. No recent ED visits.  Recommendations/Changes made from today's visit: None  Plan: CCM follow up 12 months or sooner as needed    Subjective: Derrick [name] is an 81 y.o. year old male who is a primary patient of Bedsole, Amy E, MD.  The CCM team was consulted for assistance with disease management and care coordination needs.    Engaged with patient by telephone for follow up visit in response to provider referral for pharmacy case management and/or care coordination services.   Consent to Services:  The patient was given information about Chronic Care Management services, agreed to services, and gave verbal consent prior to initiation of services.  Please see initial visit note for detailed documentation.   Patient Care Team: [name], MD as PCP - General (Family Medicine) [name], MD as PCP - Electrophysiology (Cardiology) [name], MD as PCP - Cardiology (Cardiology) Heath Lark, MD as Consulting Physician (Hematology and Oncology) Debbora Dus, [facility] as Pharmacist (Pharmacist)   Recent office visits: 10/30/2021 - [name], PCP - Pt presented for annual exam. Cholesterol at goal. Iron levels normal. Vitamin D normal. Calcium elevated. Repeat calcium level 01/11/21.  Recent consult visits: 11/16/21 - Neurology - Pt presented for follow up visit, OSA, CPAP. 100% compliance > 4 hours. Neuropathy, not improved. Declines medications. Consider PT. Follow up 1 year or sooner if needed.  Hospital visits: None in previous 6 months   Objective:  Lab Results  Component Value Date   CREATININE 1.07 10/30/2021   BUN 16 10/30/2021   GFR 71.71 10/22/2020   GFRNONAA >60 04/14/2020   GFRAA >60 04/14/2020   NA 138 10/30/2021   K 4.3 10/30/2021    CALCIUM 11.0 (H) 10/30/2021   CO2 30 10/30/2021   GLUCOSE 86 10/30/2021    Lab Results  Component Value Date/Time   HGBA1C 5.0 02/01/2020 03:20 PM   HGBA1C 4.9 06/04/2019 08:34 AM   GFR 71.71 10/22/2020 08:23 AM   GFR 82.84 06/18/2020 09:45 AM     Lab Results  Component Value Date   CHOL 173 10/30/2021   HDL 58 10/30/2021   LDLCALC 99 10/30/2021   TRIG 74 10/30/2021   CHOLHDL 3.0 10/30/2021    Hepatic Function Latest Ref Rng & Units 10/30/2021 10/22/2020 06/18/2020  Total Protein 6.1 - 8.1 g/dL 7.3 6.8 6.7  Albumin 3.5 - 5.2 g/dL - 4.3 4.2  AST 10 - 35 U/L _0 ALT 9 - 46 U/L _1 Alk Phosphatase 39 - 117 U/L - 49 59  Total Bilirubin 0.2 - 1.2 mg/dL 1.1 0.9 0.9  Bilirubin, Direct 0.0 - 0.3 mg/dL - - 0.2    Lab Results  Component Value Date/Time   TSH 2.06 06/18/2020 09:45 AM   TSH 2.02 08/17/2018 11:05 AM   FREET4 1.18 06/18/2020 09:45 AM   FREET4 0.89 08/17/2018 11:05 AM    CBC Latest Ref Rng & Units 10/30/2021 04/14/2021 10/28/2020  WBC 3.8 - 10.8 Thousand/uL 6.4 3.2(L) 4.0  Hemoglobin 13.2 - 17.1 g/dL 14.9 12.3(L) 11.9(L)  Hematocrit 38.5 - 50.0 % 43.0 38.0(L) 36.6(L)  Platelets 140 - 400 Thousand/uL 193 164 178.0    Lab Results  Component Value Date/Time   VD25OH 41 10/30/2021 03:57 PM   VD25OH 45.37 06/18/2020 09:45 AM  VD25OH 48.26 06/13/2018 11:18 AM    Clinical ASCVD: Yes  The 10-year ASCVD risk score (Arnett DK, et al., 2019) is: 25.4%   Values used to calculate the score:     Age: 70 years     Sex: Male     Is Non-Hispanic African American: No     Diabetic: No     Tobacco smoker: No     Systolic Blood Pressure: 778 mmHg     Is BP treated: Yes     HDL Cholesterol: 58 mg/dL     Total Cholesterol: 173 mg/dL    Depression screen [facility] 2/9 10/22/2021 10/21/2020 06/27/2020  Decreased Interest 0 0 0  Down, Depressed, Hopeless 0 0 0  PHQ - 2 Score 0 0 0  Altered sleeping - 0 -  Tired, decreased energy - 0 -  Change in appetite - 0 -   Feeling bad or failure about yourself  - 0 -  Trouble concentrating - 0 -  Moving slowly or fidgety/restless - 0 -  Suicidal thoughts - 0 -  PHQ-9 Score - 0 -  Difficult doing work/chores - Not difficult at all -  Some recent data might be hidden    Social History   Tobacco Use  Smoking Status Never  Smokeless Tobacco Never   BP Readings from Last 3 Encounters:  11/16/21 111/67  10/30/21 118/74  10/21/21 118/61   Pulse Readings from Last 3 Encounters:  11/16/21 69  10/30/21 70  10/21/21 67   Wt Readings from Last 3 Encounters:  11/16/21 183 lb 3.2 oz (83.1 kg)  10/30/21 175 lb 2 oz (79.4 kg)  10/22/21 175 lb (79.4 kg)   BMI Readings from Last 3 Encounters:  11/16/21 27.05 kg/m  10/30/21 27.02 kg/m  10/22/21 25.84 kg/m    Assessment/Interventions: Review of patient past medical history, allergies, medications, health status, including review of consultants reports, laboratory and other test data, was performed as part of comprehensive evaluation and provision of chronic care management services.   SDOH:  (Social Determinants of Health) assessments and interventions performed: Yes SDOH Interventions    Flowsheet Row Most Recent Value  SDOH Interventions   Financial Strain Interventions Intervention Not Indicated, Other (Comment)  [Pt declined interest in patient assistance programs]      SDOH Screenings   Alcohol Screen: Low Risk    Last Alcohol Screening Score (AUDIT): 1  Depression (PHQ2-9): Low Risk    PHQ-2 Score: 0  Financial Resource Strain: Medium Risk   Difficulty of Paying Living Expenses: Somewhat hard  Food Insecurity: No Food Insecurity   Worried About Charity fundraiser in the Last Year: Never true   Ran Out of Food in the Last Year: Never true  Housing: Low Risk    Last Housing Risk Score: 0  Physical Activity: Sufficiently Active   Days of Exercise per Week: 7 days   Minutes of Exercise per Session: 50 min  Social Connections:  Moderately Integrated   Frequency of Communication with Friends and Family: More than three times a week   Frequency of Social Gatherings with Friends and Family: More than three times a week   Attends Religious Services: More than 4 times per year   Active Member of [employer] or Organizations: No   Attends Archivist Meetings: Never   Marital Status: Married  Stress: No Stress Concern Present   Feeling of Stress : Not at all  Tobacco Use: Low Risk    Smoking Tobacco Use:  Never   Smokeless Tobacco Use: Never   Passive Exposure: Not on file  Transportation Needs: No Transportation Needs   Lack of Transportation (Medical): No   Lack of Transportation (Non-Medical): No    CCM Care Plan  Allergies  Allergen Reactions   Nsaids     Contraindicated due to being on Eliquis.    Medications Reviewed Today     Reviewed by Debbora Dus, [facility] (Pharmacist) on 01/05/22 at [place] List Status: <None>   Medication Order Taking? Sig Documenting Provider Last Dose Status Informant  albuterol (VENTOLIN HFA) 108 (90 Base) MCG/ACT inhaler [id number] Yes Inhale 2 puffs into the lungs every 6 (six) hours as needed for wheezing or shortness of breath.  Patient taking differently: Inhale 2 puffs into the lungs every 6 (six) hours as needed for wheezing or shortness of breath. Rare use - 1/2 times/annually   Bedsole, Amy E, MD Taking Active Self  amLODipine (NORVASC) 5 MG tablet [id number] Yes Take 1 tablet by mouth once daily [name], MD Taking Active   apixaban (ELIQUIS) 5 MG TABS tablet [id number] Yes Take 1 tablet by mouth twice daily [name], MD Taking Active   bimatoprost (LUMIGAN) 0.01 % SOLN [id number] Yes Place 1 drop into both eyes at bedtime. [provider] Taking Active Self  Cholecalciferol 25 MCG (1000 UT) tablet [id number] Yes Take 1,000 Units by mouth daily.  [provider] Taking Active Self  diclofenac Sodium (VOLTAREN) 1 % GEL [id number] Yes Apply  2 g topically 4 (four) times daily as needed. [provider] Taking Active   dorzolamide-timolol (COSOPT) 22.3-6.8 MG/ML ophthalmic solution [id number] Yes Place 1 drop into both eyes 2 (two) times daily.  [provider] Taking Active Self           Med Note Rosemarie Beath, MELISSA B   Wed May 12, 2016  7:26 PM)    dronedarone (MULTAQ) 400 MG tablet [id number] Yes Take 1 tablet (400 mg total) by mouth 2 (two) times daily with a meal. [name], MD Taking Active   finasteride (PROSCAR) 5 MG tablet [id number] Yes Take 5 mg by mouth daily after breakfast.  [provider] Taking Active Self  fluticasone (FLONASE) 50 MCG/ACT nasal spray [id number] Yes Place 2 sprays into both nostrils 2 (two) times daily. [provider] Taking Active Self  Fluticasone Propionate, Inhal, (FLOVENT DISKUS) 100 MCG/ACT AEPB [id number] Yes Inhale 1 puff into the lungs 2 (two) times daily as needed. [name], MD Taking Active   metoprolol succinate (TOPROL-XL) 25 MG 24 hr tablet [id number] Yes TAKE 1 TABLET BY MOUTH AT BEDTIME [name], MD Taking Active   Multiple Vitamin (MULTIVITAMIN WITH MINERALS) TABS tablet [id number] Yes Take 1 tablet by mouth daily. [provider] Taking Active Self  pantoprazole (PROTONIX) 40 MG tablet [id number] Yes Take 1 tablet by mouth once daily [name], MD Taking Active   Polyethyl Glycol-Propyl Glycol [facility] OP) [id number] Yes Place 1 drop into both eyes daily as needed (dry eyes). [provider] Taking Active Self  rosuvastatin (CRESTOR) 20 MG tablet [id number] Yes Take 1 tablet by mouth once daily [name], MD Taking Active   tadalafil (CIALIS) 5 MG tablet [id number] Yes Take 5 mg by mouth at bedtime.  [provider] Taking Active Self  triamterene-hydrochlorothiazide (MAXZIDE-25) 37.5-25 MG tablet [id number] Yes Take 1 tablet by mouth once daily [name], MD Taking Active             Patient  Active  Problem List   Diagnosis Date Noted   COVID-19 determined by clinical diagnostic criteria 10/19/2021   Carpal tunnel syndrome 06/27/2020   Overweight (BMI 25.0-29.9) 03/12/2020   S/P right TKA 03/11/2020   Deficiency anemia 02/08/2020   Iron deficiency anemia 02/08/2020   S/P CABG x 3 06/05/2019   Inguinal hernia 02/21/2019   Chronic fatigue 08/16/2018   Allergic response, initial encounter 06/01/2018   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   MCI (mild cognitive impairment) 01/26/2018   Pacemaker ECG pattern 11/30/2017   Bradycardia, sinus 11/30/2017   Ventricular tachycardia (paroxysmal) 11/30/2017   Monocytosis 06/10/2017   S/P placement of cardiac pacemaker 08/03/2016   Paroxysmal atrial fibrillation ([name]) 05/12/2016   Bradycardia 02/09/2016   DISH (diffuse idiopathic skeletal hyperostosis) 12/02/2015   Coronary artery disease involving coronary bypass graft of native heart without angina pectoris 03/26/2015   DDD (degenerative disc disease), thoracic 03/26/2015   OSA on CPAP 10/16/2014   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/16/2014   Essential hypertension, benign 05/07/2014   Routine health maintenance 12/16/2013   Neuropathy associated with MGUS ([place]) 08/08/2013   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   Allergic rhinitis 02/21/2012   Coronary artery disease involving native coronary artery of native heart with angina pectoris ([place]) 06/22/2011   Malignant basal cell neoplasm of skin 06/16/2011   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   History of colon cancer 05/28/2010   Malignant neoplasm of descending colon ([place]) 05/28/2010   Vitamin D deficiency 05/28/2010   Hyperlipidemia with target LDL less than 100 05/28/2010   GERD 05/28/2010   BPH associated with nocturia 05/28/2010   Osteoarthritis of both hands  05/28/2010   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    Immunization History  Administered Date(s) Administered   Fluad Quad(high Dose 65+) 08/14/2019, 09/22/2020   Influenza Split 09/26/2012   Influenza, High Dose Seasonal PF 09/02/2015, 08/24/2016, 09/04/2018, 09/07/2021   Influenza,inj,Quad PF,6+ Mos 09/25/2013, 09/12/2014   Influenza-Unspecified 09/15/2017, 09/05/2018   PFIZER(Purple Top)SARS-COV-2 Vaccination 01/11/2020, 02/01/2020, 08/29/2020, 04/17/2021   PNEUMOCOCCAL CONJUGATE-20 09/07/2021   Pfizer Covid-19 Vaccine Bivalent Booster 34yr & up 09/07/2021   Pneumococcal Conjugate-13 12/13/2013   Pneumococcal Polysaccharide-23 02/09/2016   Tdap 01/13/2016   Zoster Recombinat (Shingrix) 12/21/2017, 02/24/2018    Conditions to be addressed/monitored:  Hypertension, Hyperlipidemia, and Atrial Fibrillation  Care Plan : [place] Updates made by ADebbora Dus RBrinckerhoffsince 01/05/2022 12:00 AM     Problem: CHL AMB "PATIENT-SPECIFIC PROBLEM"      Long-Range Goal: Disease Management   Start Date: 01/05/2022  Priority: High  Note:     Current Barriers:  None identified  Pharmacist Clinical Goal(s):  Patient will contact provider office for questions/concerns as evidenced notation of same in electronic health record through collaboration with PharmD and provider.   Interventions: 1:1 collaboration with BJinny Sanders MD regarding development and update of comprehensive plan of care as evidenced by provider attestation and co-signature Inter-disciplinary care team collaboration (see longitudinal plan of care) Comprehensive medication review performed; medication list updated in electronic medical record  Hypertension (BP goal <140/90) -Controlled, per home and clinic readings  -Current treatment: Amlodipine 5 mg - 1 tablet daily  Metoprolol succinate 25 mg XL - 1 tablet at bedtime Triamterene/HCTZ 37.5-25 mg - 1 tablet daily  -Medications previously tried: none  reported  -Current home readings: he monitors BP at home routinely, averages 116-122/60-65, 60 (pacemaker) -  Denies hypotensive/hypertensive symptoms -Recommended to continue current medication  Hyperlipidemia: (LDL goal < 70) -Not ideally controlled, per LDL 91. His LDL is very good (58) and TC < 200. He is on max tolerated statin.  - He tolerates rosuvastatin well, unable to tolerate Lipitor due to muscle weakness/cramping -Current treatment: Rosuvastatin 20 mg - 1 tablet daily -Medications previously tried: none   -Educated on Cholesterol goals;  -Recommended to continue current medication  Atrial Fibrillation (Goal: prevent stroke and major bleeding) -Managed by cardiology -Current treatment: Rate control: Metoprolol succinate 25 mg XL - 1 tablet at bedtime Rhythm: Dronedarone 400 mg - 1 tablet twice daily with meals Anticoagulation: Eliquis 5 mg - 1 tablet twice daily -Medications previously tried: none  -Home BP and HR readings: see above -Counseled on importance of adherence to anticoagulant exactly as prescribed; avoidance of NSAIDs due to increased bleeding risk with anticoagulants; -He uses Voltaren gel about 3-4 times/day as needed and Tylenol Arthritis PRN. He avoids NSAIDs.  -Recommended to continue current medication  Patient Goals/Self-Care Activities Patient will:  - take medications as prescribed as evidenced by patient report and record review  Follow Up Plan: Telephone follow up appointment with care management team member scheduled for: 12 months (CPP), Adherence review in 6 months      Medication Assistance: None required.  Patient affirms current coverage meets needs. Multaq, Lumigan and Eliquis are very expensive. Patient declines interest in assistance programs.  Compliance/Adherence/Medication fill history: Care Gaps: None  Star-Rating Drugs: Medication:                  Last Fill:           Day Supply Rosuvastatin 56m     11/20/21             90  Patient's preferred pharmacy is:  [facility] [place] [address] [place] [address]Phone: [phone]: [phone] Uses pill box? Yes Pt endorses 100% compliance - very rare missed doses Uses [facility] for all medications  Care Plan and Follow Up Patient Decision:  Patient agrees to Care Plan and Follow-up.  MDebbora Dus PharmD Clinical Pharmacist Practitioner [facility] at [phone]

## 2022-01-05 NOTE — Patient Instructions (Signed)
Dear Derrick [name],  Below is a summary of the goals we discussed during our follow up appointment on January 05, 2022. Please contact me anytime with questions or concerns.   Visit Information  Patient Care Plan: CCM Pharmacy Care Plan     Problem Identified: CHL AMB "PATIENT-SPECIFIC PROBLEM"      Long-Range Goal: Disease Management   Start Date: 01/05/2022  Priority: High  Note:     Current Barriers:  None identified  Pharmacist Clinical Goal(s):  Patient will contact provider office for questions/concerns as evidenced notation of same in electronic health record through collaboration with PharmD and provider.   Interventions: 1:1 collaboration with [name], MD regarding development and update of comprehensive plan of care as evidenced by provider attestation and co-signature Inter-disciplinary care team collaboration (see longitudinal plan of care) Comprehensive medication review performed; medication list updated in electronic medical record  Hypertension (BP goal <140/90) -Controlled, per home and clinic readings  -Current treatment: Amlodipine 5 mg - 1 tablet daily  Metoprolol succinate 25 mg XL - 1 tablet at bedtime Triamterene/HCTZ 37.5-25 mg - 1 tablet daily  -Medications previously tried: none reported  -Current home readings: he monitors BP at home routinely, averages 116-122/60-65, 60 (pacemaker) -Denies hypotensive/hypertensive symptoms -Recommended to continue current medication  Hyperlipidemia: (LDL goal < 70) -Not ideally controlled, per LDL 91. His LDL is very good (58) and TC < 200. He is on max tolerated statin.  - He tolerates rosuvastatin well, unable to tolerate Lipitor due to muscle weakness/cramping -Current treatment: Rosuvastatin 20 mg - 1 tablet daily -Medications previously tried: none   -Educated on Cholesterol goals;  -Recommended to continue current medication  Atrial Fibrillation (Goal: prevent stroke and major  bleeding) -Managed by cardiology -Current treatment: Rate control: Metoprolol succinate 25 mg XL - 1 tablet at bedtime Rhythm: Dronedarone 400 mg - 1 tablet twice daily with meals Anticoagulation: Eliquis 5 mg - 1 tablet twice daily -Medications previously tried: none  -Home BP and HR readings: see above -Counseled on importance of adherence to anticoagulant exactly as prescribed; avoidance of NSAIDs due to increased bleeding risk with anticoagulants; -He uses Voltaren gel about 3-4 times/day as needed and Tylenol Arthritis PRN. He avoids NSAIDs.  -Recommended to continue current medication  Patient Goals/Self-Care Activities Patient will:  - take medications as prescribed as evidenced by patient report and record review  Follow Up Plan: Telephone follow up appointment with care management team member scheduled for: 12 months (CPP), Adherence review in 6 months       Patient verbalizes understanding of instructions provided today and agrees to view in [place].   Debbora Dus, PharmD Clinical Pharmacist Practitioner [facility] at [facility] [phone]

## 2022-01-05 NOTE — Telephone Encounter (Signed)
°  Chronic Care Management   Outreach Note  01/05/2022 Name: Derrick [name] MRN: [id number] DOB: 08-Mar-1941  Patient had a phone appointment scheduled with clinical pharmacist today.   An unsuccessful telephone outreach was attempted. The patient was referred to the pharmacist for assistance with medications, care management and care coordination.    Patient will NOT be penalized in any way for missing a CCM appointment. The no-show fee does not apply.   A message was left to return call to: [phone].   Debbora Dus, PharmD Clinical Pharmacist [facility] at [facility] [phone]

## 2022-01-06 NOTE — Telephone Encounter (Signed)
Patient returned call and visit completed.

## 2022-01-11 ENCOUNTER — Other Ambulatory Visit: Payer: Self-pay

## 2022-01-11 ENCOUNTER — Other Ambulatory Visit (INDEPENDENT_AMBULATORY_CARE_PROVIDER_SITE_OTHER): Payer: Medicare Other

## 2022-01-12 LAB — PTH, INTACT AND CALCIUM
Calcium: 10.1 mg/dL (ref 8.6–10.3)
PTH: 75 pg/mL (ref 16–77)

## 2022-01-26 DIAGNOSIS — I480 Paroxysmal atrial fibrillation: Secondary | ICD-10-CM | POA: Diagnosis not present

## 2022-01-26 DIAGNOSIS — I10 Essential (primary) hypertension: Secondary | ICD-10-CM

## 2022-01-26 DIAGNOSIS — E785 Hyperlipidemia, unspecified: Secondary | ICD-10-CM | POA: Diagnosis not present

## 2022-01-28 ENCOUNTER — Other Ambulatory Visit: Payer: Self-pay | Admitting: Cardiovascular Disease

## 2022-02-04 ENCOUNTER — Telehealth: Payer: Self-pay

## 2022-02-04 DIAGNOSIS — I350 Nonrheumatic aortic (valve) stenosis: Secondary | ICD-10-CM

## 2022-02-04 DIAGNOSIS — Z20822 Contact with and (suspected) exposure to covid-19: Secondary | ICD-10-CM | POA: Diagnosis not present

## 2022-02-04 NOTE — Telephone Encounter (Signed)
Per Dr. Burt Knack, arranged echo and office visit on 05/31/2022 for 1 year follow-up. The patient was grateful for call and agrees with plan.

## 2022-02-09 ENCOUNTER — Other Ambulatory Visit: Payer: Self-pay | Admitting: Cardiovascular Disease

## 2022-02-09 DIAGNOSIS — I4891 Unspecified atrial fibrillation: Secondary | ICD-10-CM

## 2022-02-09 NOTE — Telephone Encounter (Signed)
Eliquis 5mg  refill request received. Patient is 81 years old, weight-83.1kg, Crea-1.07 on 10/30/2021, Diagnosis-Afib, and last seen by Dr. Curt Bears on 09/14/2021. Dose is appropriate based on dosing criteria. Will send in refill to requested pharmacy.

## 2022-02-27 DIAGNOSIS — U071 COVID-19: Secondary | ICD-10-CM | POA: Diagnosis not present

## 2022-03-03 DIAGNOSIS — H401122 Primary open-angle glaucoma, left eye, moderate stage: Secondary | ICD-10-CM | POA: Diagnosis not present

## 2022-03-03 DIAGNOSIS — H401112 Primary open-angle glaucoma, right eye, moderate stage: Secondary | ICD-10-CM | POA: Diagnosis not present

## 2022-03-22 ENCOUNTER — Ambulatory Visit (INDEPENDENT_AMBULATORY_CARE_PROVIDER_SITE_OTHER): Payer: Medicare Other

## 2022-03-22 DIAGNOSIS — I442 Atrioventricular block, complete: Secondary | ICD-10-CM | POA: Diagnosis not present

## 2022-03-23 DIAGNOSIS — Z20822 Contact with and (suspected) exposure to covid-19: Secondary | ICD-10-CM | POA: Diagnosis not present

## 2022-03-23 LAB — CUP PACEART REMOTE DEVICE CHECK
Battery Remaining Longevity: 37 mo
Battery Voltage: 2.96 V
Brady Statistic AP VP Percent: 99.4 %
Brady Statistic AP VS Percent: 0.01 %
Brady Statistic AS VP Percent: 0.6 %
Brady Statistic AS VS Percent: 0 %
Brady Statistic RA Percent Paced: 96.22 %
Brady Statistic RV Percent Paced: 99.98 %
Date Time Interrogation Session: 20230327102829
Implantable Lead Implant Date: 20170517
Implantable Lead Implant Date: 20170517
Implantable Lead Location: 753859
Implantable Lead Location: 753860
Implantable Lead Model: 5076
Implantable Lead Model: 5076
Implantable Pulse Generator Implant Date: 20170517
Lead Channel Impedance Value: 361 Ohm
Lead Channel Impedance Value: 418 Ohm
Lead Channel Impedance Value: 494 Ohm
Lead Channel Impedance Value: 627 Ohm
Lead Channel Pacing Threshold Amplitude: 0.625 V
Lead Channel Pacing Threshold Amplitude: 0.875 V
Lead Channel Pacing Threshold Pulse Width: 0.4 ms
Lead Channel Pacing Threshold Pulse Width: 0.4 ms
Lead Channel Sensing Intrinsic Amplitude: 1.5 mV
Lead Channel Sensing Intrinsic Amplitude: 1.5 mV
Lead Channel Sensing Intrinsic Amplitude: 5.875 mV
Lead Channel Sensing Intrinsic Amplitude: 5.875 mV
Lead Channel Setting Pacing Amplitude: 2 V
Lead Channel Setting Pacing Amplitude: 2.5 V
Lead Channel Setting Pacing Pulse Width: 0.4 ms
Lead Channel Setting Sensing Sensitivity: 2.8 mV

## 2022-04-01 NOTE — Progress Notes (Signed)
Remote pacemaker transmission.   

## 2022-04-15 ENCOUNTER — Other Ambulatory Visit: Payer: Medicare Other

## 2022-04-21 DIAGNOSIS — U071 COVID-19: Secondary | ICD-10-CM | POA: Diagnosis not present

## 2022-04-22 ENCOUNTER — Ambulatory Visit: Payer: Medicare Other | Admitting: Hematology and Oncology

## 2022-04-26 ENCOUNTER — Other Ambulatory Visit: Payer: Self-pay | Admitting: Cardiovascular Disease

## 2022-04-26 DIAGNOSIS — I4891 Unspecified atrial fibrillation: Secondary | ICD-10-CM

## 2022-04-28 ENCOUNTER — Other Ambulatory Visit: Payer: Self-pay

## 2022-04-28 DIAGNOSIS — D539 Nutritional anemia, unspecified: Secondary | ICD-10-CM

## 2022-04-29 ENCOUNTER — Other Ambulatory Visit: Payer: Self-pay | Admitting: Cardiovascular Disease

## 2022-04-29 DIAGNOSIS — D860 Sarcoidosis of lung: Secondary | ICD-10-CM | POA: Diagnosis not present

## 2022-04-29 DIAGNOSIS — G6181 Chronic inflammatory demyelinating polyneuritis: Secondary | ICD-10-CM | POA: Diagnosis not present

## 2022-04-29 DIAGNOSIS — M1991 Primary osteoarthritis, unspecified site: Secondary | ICD-10-CM | POA: Diagnosis not present

## 2022-04-29 DIAGNOSIS — Z6828 Body mass index (BMI) 28.0-28.9, adult: Secondary | ICD-10-CM | POA: Diagnosis not present

## 2022-04-29 DIAGNOSIS — M25561 Pain in right knee: Secondary | ICD-10-CM | POA: Diagnosis not present

## 2022-04-29 DIAGNOSIS — D472 Monoclonal gammopathy: Secondary | ICD-10-CM | POA: Diagnosis not present

## 2022-04-29 DIAGNOSIS — E663 Overweight: Secondary | ICD-10-CM | POA: Diagnosis not present

## 2022-04-29 DIAGNOSIS — M4810 Ankylosing hyperostosis [Forestier], site unspecified: Secondary | ICD-10-CM | POA: Diagnosis not present

## 2022-04-30 ENCOUNTER — Other Ambulatory Visit: Payer: Self-pay

## 2022-04-30 ENCOUNTER — Other Ambulatory Visit: Payer: Self-pay | Admitting: Hematology and Oncology

## 2022-04-30 ENCOUNTER — Inpatient Hospital Stay: Payer: Medicare Other | Attending: Hematology and Oncology

## 2022-04-30 DIAGNOSIS — D508 Other iron deficiency anemias: Secondary | ICD-10-CM

## 2022-04-30 DIAGNOSIS — I4891 Unspecified atrial fibrillation: Secondary | ICD-10-CM | POA: Insufficient documentation

## 2022-04-30 DIAGNOSIS — D539 Nutritional anemia, unspecified: Secondary | ICD-10-CM

## 2022-04-30 DIAGNOSIS — D472 Monoclonal gammopathy: Secondary | ICD-10-CM

## 2022-04-30 DIAGNOSIS — Z7901 Long term (current) use of anticoagulants: Secondary | ICD-10-CM | POA: Diagnosis not present

## 2022-04-30 DIAGNOSIS — D869 Sarcoidosis, unspecified: Secondary | ICD-10-CM | POA: Diagnosis not present

## 2022-04-30 DIAGNOSIS — G6181 Chronic inflammatory demyelinating polyneuritis: Secondary | ICD-10-CM | POA: Insufficient documentation

## 2022-04-30 DIAGNOSIS — D509 Iron deficiency anemia, unspecified: Secondary | ICD-10-CM | POA: Diagnosis not present

## 2022-04-30 DIAGNOSIS — Z85038 Personal history of other malignant neoplasm of large intestine: Secondary | ICD-10-CM | POA: Diagnosis not present

## 2022-04-30 LAB — IRON AND IRON BINDING CAPACITY (CC-WL,HP ONLY)
Iron: 73 ug/dL (ref 45–182)
Saturation Ratios: 18 % (ref 17.9–39.5)
TIBC: 409 ug/dL (ref 250–450)
UIBC: 336 ug/dL (ref 117–376)

## 2022-04-30 LAB — CBC WITH DIFFERENTIAL/PLATELET
Abs Immature Granulocytes: 0.01 10*3/uL (ref 0.00–0.07)
Basophils Absolute: 0 10*3/uL (ref 0.0–0.1)
Basophils Relative: 1 %
Eosinophils Absolute: 0.2 10*3/uL (ref 0.0–0.5)
Eosinophils Relative: 4 %
HCT: 39.3 % (ref 39.0–52.0)
Hemoglobin: 13.3 g/dL (ref 13.0–17.0)
Immature Granulocytes: 0 %
Lymphocytes Relative: 21 %
Lymphs Abs: 0.9 10*3/uL (ref 0.7–4.0)
MCH: 31.4 pg (ref 26.0–34.0)
MCHC: 33.8 g/dL (ref 30.0–36.0)
MCV: 92.7 fL (ref 80.0–100.0)
Monocytes Absolute: 0.5 10*3/uL (ref 0.1–1.0)
Monocytes Relative: 11 %
Neutro Abs: 2.8 10*3/uL (ref 1.7–7.7)
Neutrophils Relative %: 63 %
Platelets: 165 10*3/uL (ref 150–400)
RBC: 4.24 MIL/uL (ref 4.22–5.81)
RDW: 13.2 % (ref 11.5–15.5)
WBC: 4.4 10*3/uL (ref 4.0–10.5)
nRBC: 0 % (ref 0.0–0.2)

## 2022-04-30 LAB — FERRITIN: Ferritin: 98 ng/mL (ref 24–336)

## 2022-05-07 ENCOUNTER — Inpatient Hospital Stay: Payer: Medicare Other

## 2022-05-07 ENCOUNTER — Other Ambulatory Visit: Payer: Self-pay

## 2022-05-07 ENCOUNTER — Inpatient Hospital Stay (HOSPITAL_BASED_OUTPATIENT_CLINIC_OR_DEPARTMENT_OTHER): Payer: Medicare Other | Admitting: Hematology and Oncology

## 2022-05-07 ENCOUNTER — Encounter: Payer: Self-pay | Admitting: Hematology and Oncology

## 2022-05-07 VITALS — BP 131/70 | HR 72 | Temp 98.2°F | Resp 16 | Ht 69.0 in | Wt 191.6 lb

## 2022-05-07 DIAGNOSIS — D509 Iron deficiency anemia, unspecified: Secondary | ICD-10-CM | POA: Diagnosis not present

## 2022-05-07 DIAGNOSIS — G6181 Chronic inflammatory demyelinating polyneuritis: Secondary | ICD-10-CM | POA: Diagnosis not present

## 2022-05-07 DIAGNOSIS — D472 Monoclonal gammopathy: Secondary | ICD-10-CM | POA: Diagnosis not present

## 2022-05-07 DIAGNOSIS — I4891 Unspecified atrial fibrillation: Secondary | ICD-10-CM | POA: Diagnosis not present

## 2022-05-07 DIAGNOSIS — D869 Sarcoidosis, unspecified: Secondary | ICD-10-CM | POA: Diagnosis not present

## 2022-05-07 DIAGNOSIS — D508 Other iron deficiency anemias: Secondary | ICD-10-CM | POA: Diagnosis not present

## 2022-05-07 DIAGNOSIS — Z7901 Long term (current) use of anticoagulants: Secondary | ICD-10-CM | POA: Diagnosis not present

## 2022-05-07 NOTE — Progress Notes (Signed)
[place] ?OFFICE PROGRESS NOTE ? ?Patient Care Team: ?[name], MD as PCP - General (Family Medicine) ?[name], MD as PCP - Electrophysiology (Cardiology) ?[name], MD as PCP - Cardiology (Cardiology) ?Heath Lark, MD as Consulting Physician (Hematology and Oncology) ?Debbora Dus, [facility] as Pharmacist (Pharmacist) ? ?ASSESSMENT & PLAN:  ?MGUS (monoclonal gammopathy of unknown significance) ?Unfortunately, his recent myeloma panel was not drawn ?We will repeat the test today and I will call him with test results ?We will continue observation ? ?Iron deficiency anemia ?He has remote history of iron deficiency anemia ?He is on chronic anticoagulation therapy with Eliquis ?He has no signs of anemia today ?He cannot tolerate oral iron supplement ?I recommend repeat iron study in his next visit ? ?Orders Placed This Encounter  ?Procedures  ? CMP ([facility] only)  ?  Standing Status:   Future  ?  Standing Expiration Date:   05/08/2023  ? CBC with Differential ([facility] Only)  ?  Standing Status:   Future  ?  Standing Expiration Date:   05/08/2023  ? Iron and Iron Binding Capacity ([facility] only)  ?  Standing Status:   Future  ?  Standing Expiration Date:   05/08/2023  ? Ferritin  ?  Standing Status:   Future  ?  Standing Expiration Date:   05/07/2023  ? Kappa/lambda light chains  ?  Standing Status:   Future  ?  Standing Expiration Date:   05/08/2023  ? Multiple Myeloma Panel (SPEP&IFE w/QIG)  ?  Standing Status:   Future  ?  Standing Expiration Date:   05/08/2023  ? ? ?All questions were answered. The patient knows to call the clinic with any problems, questions or concerns. ?The total time spent in the appointment was 20 minutes encounter with patients including review of chart and various tests results, discussions about plan of care and coordination of care plan ?  ?Heath Lark, MD ?05/07/2022 11:57 AM ? ?INTERVAL HISTORY: ?Please see below for problem oriented  charting. ?he returns for surveillance follow-up for this and iron deficiency anemia ?He is doing well ?No recent bleeding complications ?Denies recent infection, fever or chills ?No new bone pain ? ?REVIEW OF SYSTEMS:   ?Constitutional: Denies fevers, chills or abnormal weight loss ?Eyes: Denies blurriness of vision ?Ears, nose, mouth, throat, and face: Denies mucositis or sore throat ?Respiratory: Denies cough, dyspnea or wheezes ?Cardiovascular: Denies palpitation, chest discomfort or lower extremity swelling ?Gastrointestinal:  Denies nausea, heartburn or change in bowel habits ?Skin: Denies abnormal skin rashes ?Lymphatics: Denies new lymphadenopathy or easy bruising ?Neurological:Denies numbness, tingling or new weaknesses ?Behavioral/Psych: Mood is stable, no new changes  ?All other systems were reviewed with the patient and are negative. ? ?I have reviewed the past medical history, past surgical history, social history and family history with the patient and they are unchanged from previous note. ? ?ALLERGIES:  is allergic to nsaids. ? ?MEDICATIONS:  ?Current Outpatient Medications  ?Medication Sig Dispense Refill  ? albuterol (VENTOLIN HFA) 108 (90 Base) MCG/ACT inhaler Inhale 2 puffs into the lungs every 6 (six) hours as needed for wheezing or shortness of breath. (Patient taking differently: Inhale 2 puffs into the lungs every 6 (six) hours as needed for wheezing or shortness of breath. Rare use - 1/2 times/annually) 8 g 2  ? amLODipine (NORVASC) 5 MG tablet Take 1 tablet by mouth once daily 90 tablet 3  ? apixaban (ELIQUIS) 5 MG TABS tablet Take 1 tablet by  mouth twice daily 180 tablet 1  ? bimatoprost (LUMIGAN) 0.01 % SOLN Place 1 drop into both eyes at bedtime.    ? Cholecalciferol 25 MCG (1000 UT) tablet Take 1,000 Units by mouth daily.     ? diclofenac Sodium (VOLTAREN) 1 % GEL Apply 2 g topically 4 (four) times daily as needed.    ? dorzolamide-timolol (COSOPT) 22.3-6.8 MG/ML ophthalmic solution  Place 1 drop into both eyes 2 (two) times daily.     ? finasteride (PROSCAR) 5 MG tablet Take 5 mg by mouth daily after breakfast.     ? fluticasone (FLONASE) 50 MCG/ACT nasal spray Place 2 sprays into both nostrils 2 (two) times daily.    ? Fluticasone Propionate, Inhal, (FLOVENT DISKUS) 100 MCG/ACT AEPB Inhale 1 puff into the lungs 2 (two) times daily as needed. 28 each 2  ? metoprolol succinate (TOPROL-XL) 25 MG 24 hr tablet TAKE 1 TABLET BY MOUTH AT BEDTIME 90 tablet 2  ? MULTAQ 400 MG tablet TAKE 1 TABLET BY MOUTH TWICE DAILY WITH MEALS 180 tablet 1  ? Multiple Vitamin (MULTIVITAMIN WITH MINERALS) TABS tablet Take 1 tablet by mouth daily.    ? pantoprazole (PROTONIX) 40 MG tablet Take 1 tablet by mouth once daily 90 tablet 3  ? Polyethyl Glycol-Propyl Glycol (SYSTANE OP) Place 1 drop into both eyes daily as needed (dry eyes).    ? rosuvastatin (CRESTOR) 20 MG tablet Take 1 tablet by mouth once daily 90 tablet 3  ? tadalafil (CIALIS) 5 MG tablet Take 5 mg by mouth at bedtime.     ? triamterene-hydrochlorothiazide (MAXZIDE-25) 37.5-25 MG tablet Take 1 tablet by mouth once daily 90 tablet 0  ? ?No current facility-administered medications for this visit.  ? ? ?SUMMARY OF ONCOLOGIC HISTORY: ? ?Derrick [name] was transferred to my care after his prior physician has left.  ?I reviewed the patient's records extensive and collaborated the history with the patient. 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 He was initially evaluated and treated by a hematologist and a neurologist in [place] before he moved to [place]. 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 He had bone marrow aspirate and biopsy which showed no evidence of malignancy in his bone marrow. ?His neurologic condition is characterized  primarily by painful distal neuropathy, feet worse than hands, and lower extremity weakness. At one point he could barely walk a block without having to stop due to pain and weakness. He was given an initial trial of Rituxan in 2010 with no significant improvement. However following reinitiation of Rituxan in April 2012 he did get a progressive improvement. He was put on a maintenance program which we have continued in this office on an every other month basis through 03/23/2013.  ?The patient have new onset atrial fibrillation. His echocardiogram shows significant dilated atrium without evidence of increase ventricular wall thickness. 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 ? ?PHYSICAL EXAMINATION: ?ECOG PERFORMANCE STATUS: 1 - Symptomatic but completely ambulatory ? ?Vitals:  ? 05/07/22 0759  ?BP: 131/70  ?Pulse: 72  ?Resp: 16  ?Temp: 98.2 ?F (36.8 ?C)  ?SpO2: 100%  ? ?Filed Weights  ? 05/07/22 0759  ?Weight: 191 lb 9.6 oz (86.9 kg)  ? ? ?GENERAL:alert, no distress and comfortable ?NEURO: alert & oriented x 3 with fluent speech, no focal motor/sensory deficits ? ?LABORATORY DATA:  ?I have reviewed the data as listed ?   ?Component Value Date/Time  ? NA 138 10/30/2021 1557  ? NA 141 05/29/2018 0806  ? NA 141 07/27/2016 0748  ? K 4.3 10/30/2021 1557  ? K 3.9 07/27/2016 0748  ? CL 99 10/30/2021 1557  ? CL 104 03/23/2013 0855  ? CO2 30 10/30/2021 1557  ? CO2 30 (H) 07/27/2016 0748  ?  GLUCOSE 86 10/30/2021 1557  ? GLUCOSE 122 07/27/2016 0748  ? GLUCOSE 98 03/23/2013 0855  ? BUN 16 10/30/2021 1557  ? BUN 14 05/29/2018 0806  ? BUN 16.0 07/27/2016 0748  ? CREATININE 1.07 10/30/2021 1557  ? CREATININE 1.0 08/01

## 2022-05-07 NOTE — Assessment & Plan Note (Signed)
Unfortunately, his recent myeloma panel was not drawn ?We will repeat the test today and I will call him with test results ?We will continue observation ?

## 2022-05-07 NOTE — Assessment & Plan Note (Signed)
He has remote history of iron deficiency anemia ?He is on chronic anticoagulation therapy with Eliquis ?He has no signs of anemia today ?He cannot tolerate oral iron supplement ?I recommend repeat iron study in his next visit ?

## 2022-05-10 ENCOUNTER — Telehealth: Payer: Self-pay | Admitting: Hematology and Oncology

## 2022-05-10 LAB — KAPPA/LAMBDA LIGHT CHAINS
Kappa free light chain: 31.6 mg/L — ABNORMAL HIGH (ref 3.3–19.4)
Kappa, lambda light chain ratio: 2.23 — ABNORMAL HIGH (ref 0.26–1.65)
Lambda free light chains: 14.2 mg/L (ref 5.7–26.3)

## 2022-05-10 NOTE — Telephone Encounter (Signed)
Scheduled appointment per 05/12 los. Patient aware.  ?

## 2022-05-11 ENCOUNTER — Encounter: Payer: Self-pay | Admitting: Cardiovascular Disease

## 2022-05-12 ENCOUNTER — Encounter (HOSPITAL_COMMUNITY): Payer: Self-pay | Admitting: Emergency Medicine

## 2022-05-12 ENCOUNTER — Emergency Department (HOSPITAL_COMMUNITY)
Admission: EM | Admit: 2022-05-12 | Discharge: 2022-05-12 | Disposition: A | Discharge status: Home / Self Care | Payer: Medicare Other | Attending: Emergency Medicine | Admitting: Emergency Medicine

## 2022-05-12 ENCOUNTER — Other Ambulatory Visit: Payer: Self-pay

## 2022-05-12 ENCOUNTER — Encounter: Payer: Self-pay | Admitting: Hematology and Oncology

## 2022-05-12 ENCOUNTER — Telehealth: Payer: Self-pay

## 2022-05-12 DIAGNOSIS — K91840 Postprocedural hemorrhage and hematoma of a digestive system organ or structure following a digestive system procedure: Secondary | ICD-10-CM | POA: Insufficient documentation

## 2022-05-12 DIAGNOSIS — Z9889 Other specified postprocedural states: Secondary | ICD-10-CM

## 2022-05-12 DIAGNOSIS — Z7901 Long term (current) use of anticoagulants: Secondary | ICD-10-CM | POA: Diagnosis not present

## 2022-05-12 DIAGNOSIS — K1379 Other lesions of oral mucosa: Secondary | ICD-10-CM

## 2022-05-12 LAB — CBC WITH DIFFERENTIAL/PLATELET
Abs Immature Granulocytes: 0.01 10*3/uL (ref 0.00–0.07)
Basophils Absolute: 0.1 10*3/uL (ref 0.0–0.1)
Basophils Relative: 1 %
Eosinophils Absolute: 0.2 10*3/uL (ref 0.0–0.5)
Eosinophils Relative: 3 %
HCT: 40.8 % (ref 39.0–52.0)
Hemoglobin: 13.6 g/dL (ref 13.0–17.0)
Immature Granulocytes: 0 %
Lymphocytes Relative: 20 %
Lymphs Abs: 1 10*3/uL (ref 0.7–4.0)
MCH: 31.5 pg (ref 26.0–34.0)
MCHC: 33.3 g/dL (ref 30.0–36.0)
MCV: 94.4 fL (ref 80.0–100.0)
Monocytes Absolute: 0.4 10*3/uL (ref 0.1–1.0)
Monocytes Relative: 9 %
Neutro Abs: 3.5 10*3/uL (ref 1.7–7.7)
Neutrophils Relative %: 67 %
Platelets: 191 10*3/uL (ref 150–400)
RBC: 4.32 MIL/uL (ref 4.22–5.81)
RDW: 12.9 % (ref 11.5–15.5)
WBC: 5.2 10*3/uL (ref 4.0–10.5)
nRBC: 0 % (ref 0.0–0.2)

## 2022-05-12 LAB — BASIC METABOLIC PANEL
Anion gap: 7 (ref 5–15)
BUN: 15 mg/dL (ref 8–23)
CO2: 28 mmol/L (ref 22–32)
Calcium: 10.6 mg/dL — ABNORMAL HIGH (ref 8.9–10.3)
Chloride: 103 mmol/L (ref 98–111)
Creatinine, Ser: 1.07 mg/dL (ref 0.61–1.24)
GFR, Estimated: >60 mL/min (ref >60)
Glucose, Bld: 129 mg/dL — ABNORMAL HIGH (ref 70–99)
Potassium: 3.8 mmol/L (ref 3.5–5.1)
Sodium: 138 mmol/L (ref 135–145)

## 2022-05-12 LAB — MULTIPLE MYELOMA PANEL, SERUM
Albumin SerPl Elph-Mcnc: 4.4 g/dL (ref 2.9–4.4)
Albumin/Glob SerPl: 1.6 (ref 0.7–1.7)
Alpha 1: 0.2 g/dL (ref 0.0–0.4)
Alpha2 Glob SerPl Elph-Mcnc: 0.7 g/dL (ref 0.4–1.0)
B-Globulin SerPl Elph-Mcnc: 0.8 g/dL (ref 0.7–1.3)
Gamma Glob SerPl Elph-Mcnc: 1.1 g/dL (ref 0.4–1.8)
Globulin, Total: 2.8 g/dL (ref 2.2–3.9)
IgA: 228 mg/dL (ref 61–437)
IgG (Immunoglobin G), Serum: 822 mg/dL (ref 603–1613)
IgM (Immunoglobulin M), Srm: 480 mg/dL — ABNORMAL HIGH (ref 15–143)
Total Protein ELP: 7.2 g/dL (ref 6.0–8.5)

## 2022-05-12 NOTE — Telephone Encounter (Signed)
I spoke with pt and he is on his way to [facility] ED now. Sending FYI to Dr [name] who is out of office today. ?

## 2022-05-12 NOTE — Telephone Encounter (Signed)
[facility] Day - Client ?TELEPHONE ADVICE RECORD ?AccessNurse? ?Patient ?Name: ?[place] ?Gender: Male ?DOB: Sep 14, 1941 ?Age: 81 Y 1 M 11 D ?Return ?Phone ?Number: ?[id number] ?(Primary) ?Address: ?City/ ?State/ ?Zip: ?[place] ? [address] ?Client [facility] - Client ?Client Site [place] - Day ?Provider [name] - MD ?Contact Type Call ?Who Is Calling Patient / Member / Family / Caregiver ?Call Type Triage / Clinical ?Relationship To Patient Self ?Return Phone Number [phone] (Primary) ?Chief Complaint BLEEDING - Uncontrollable (not vaginal) ?Reason for Call Symptomatic / Request for Health Information ?Initial Comment Caller states she has a pt on the line that saw ?the dentist and had a oral surgery done on the ?05/04/2022. Caller states he has not been able to ?stop the bleeding since then and the dentist cant ?stop it either and needs advice. Caller states he is ?also on eloquis and was told his last resort his go ?to the ER. ?Translation No ?Nurse Assessment ?Nurse: Raphael Gibney, RN, Vanita Ingles Date/Time Eilene Ghazi Time): 05/12/2022 9:48:07 AM ?Confirm and document reason for call. If ?symptomatic, describe symptoms. ?---Caller states he had oral surgery on May 9. Has been ?bleeding constantly for the past 8 days. oral surgeon ?can not stop the bleeding. ?Does the patient have any new or worsening ?symptoms? ---Yes ?Will a triage be completed? ---Yes ?Related visit to physician within the last 2 weeks? ---Yes ?Does the PT have any chronic conditions? (i.e. ?diabetes, asthma, this includes High risk factors for ?pregnancy, etc.) ?---Yes ?List chronic conditions. ---anemia; CABG surgery; pacemaker ?Is this a behavioral health or substance abuse call? ---No ?Guidelines ?Guideline Title Affirmed Question Affirmed Notes Nurse Date/Time (Eastern ?Time) ?Post-Op Incision ?Symptoms and ?Questions ?[1] Bleeding from ?incision AND [2] ?won't  stop after 10 ?minutes of direct ?pressure ?Raphael Gibney, RN, Vera 05/12/2022 9:56:50 ?AM ?PLEASE NOTE: All timestamps contained within this report are represented as [place] Standard Time. ?CONFIDENTIALTY NOTICE: This fax transmission is intended only for the addressee. It contains information that is legally privileged, confidential or ?otherwise protected from use or disclosure. If you are not the intended recipient, you are strictly prohibited from reviewing, disclosing, copying using ?or disseminating any of this information or taking any action in reliance on or regarding this information. If you have received this fax in error, please ?notify us immediately by telephone so that we can arrange for its return to [place]. Phone: [phone], Toll-Free: [phone], Fax: [phone] ?Page: 2 of 2 ?Call Id: [id number] ?Disp. Time (Eastern ?Time) Disposition Final User ?05/12/2022 9:44:10 AM Send to Urgent Sharlet Salina, Chloe-Jade ?05/12/2022 9:59:38 AM Go to ED Now Yes Raphael Gibney, RN, Vanita Ingles ?Caller Disagree/Comply Comply ?Caller Understands Yes ?PreDisposition Did not know what to do ?Care Advice Given Per Guideline ?GO TO ED NOW: * You need to be seen in the Emergency Department. ANOTHER ADULT SHOULD DRIVE: * It is better and ?safer if another adult drives instead of you. ?Comments ?User: Dannielle Burn, RN Date/Time Eilene Ghazi Time): 05/12/2022 10:00:37 AM ?pt takes eliquis ?Referrals ?[place]

## 2022-05-12 NOTE — ED Provider Notes (Signed)
?[facility] ?Provider Note ? ? ?CSN: [id number] ?Arrival date & time: 05/12/22  1122 ? ?  ? ?History ? ?Chief Complaint  ?Patient presents with  ? Post-op Problem  ? ? ?Drae Isais Klipfel is a 81 y.o. male. ? ?Patient presents c/o recurrent bleeding from recent oral surgical procedure site left side of mouth. Indicates procedure was done 8 days ago. Intermittent bleeding since. Has seen his oral surgeon, Dr Tamala Julian, has tried tea bags and 'a powder' to area, but intermittent bleeding persists. Was told to hold eliquis for two days by his cardiologist - took last dose last evening. Denies faintness or dizziness. No other abnormal bruising or bleeding. No increased swelling or increased pain at site. No fevers. Indicates bleeding now improved.  ? ?The history is provided by the patient and medical records.  ? ?  ? ?Home Medications ?Prior to Admission medications   ?Medication Sig Start Date End Date Taking? Authorizing Provider  ?albuterol (VENTOLIN HFA) 108 (90 Base) MCG/ACT inhaler Inhale 2 puffs into the lungs every 6 (six) hours as needed for wheezing or shortness of breath. ?Patient taking differently: Inhale 2 puffs into the lungs every 6 (six) hours as needed for wheezing or shortness of breath. Rare use - 1/2 times/annually 12/12/19   [name], MD  ?amLODipine (NORVASC) 5 MG tablet Take 1 tablet by mouth once daily 04/27/22   [name], MD  ?apixaban Arne Cleveland) 5 MG TABS tablet Take 1 tablet by mouth twice daily 02/09/22   Camnitz, Ocie Doyne, MD  ?bimatoprost (LUMIGAN) 0.01 % SOLN Place 1 drop into both eyes at bedtime.    [provider]  ?Cholecalciferol 25 MCG (1000 UT) tablet Take 1,000 Units by mouth daily.     [provider]  ?diclofenac Sodium (VOLTAREN) 1 % GEL Apply 2 g topically 4 (four) times daily as needed.    [provider]  ?dorzolamide-timolol (COSOPT) 22.3-6.8 MG/ML ophthalmic solution Place 1 drop into both eyes 2  (two) times daily.  09/30/15   [provider]  ?finasteride (PROSCAR) 5 MG tablet Take 5 mg by mouth daily after breakfast.  10/13/12   [provider]  ?fluticasone (FLONASE) 50 MCG/ACT nasal spray Place 2 sprays into both nostrils 2 (two) times daily.    [provider]  ?Fluticasone Propionate, Inhal, (FLOVENT DISKUS) 100 MCG/ACT AEPB Inhale 1 puff into the lungs 2 (two) times daily as needed. 11/16/21   Bedsole, Amy E, MD  ?metoprolol succinate (TOPROL-XL) 25 MG 24 hr tablet TAKE 1 TABLET BY MOUTH AT BEDTIME 10/20/21   [name], MD  ?MULTAQ 400 MG tablet TAKE 1 TABLET BY MOUTH TWICE DAILY WITH MEALS 01/28/22   Camnitz, Ocie Doyne, MD  ?Multiple Vitamin (MULTIVITAMIN WITH MINERALS) TABS tablet Take 1 tablet by mouth daily.    [provider]  ?pantoprazole (PROTONIX) 40 MG tablet Take 1 tablet by mouth once daily 04/27/22   [name], MD  ?Polyethyl Glycol-Propyl Glycol (SYSTANE OP) Place 1 drop into both eyes daily as needed (dry eyes).    [provider]  ?rosuvastatin (CRESTOR) 20 MG tablet Take 1 tablet by mouth once daily 04/27/22   [name], MD  ?tadalafil (CIALIS) 5 MG tablet Take 5 mg by mouth at bedtime.     [provider]  ?triamterene-hydrochlorothiazide Bradd Burner) 37.5-25 MG tablet Take 1 tablet by mouth once daily 04/29/22   [name], MD  ?   ? ?Allergies    ?Nsaids   ? ?  Review of Systems   ?Review of Systems  ?Constitutional:  Negative for fever.  ?HENT:  Positive for dental problem. Negative for sore throat and trouble swallowing.   ?Respiratory:  Negative for shortness of breath.   ?Neurological:  Negative for light-headedness.  ?Hematological:   ?     On eliquis.   ? ?Physical Exam ?Updated Vital Signs ?BP 125/67 (BP Location: Left Arm)   Pulse 62   Temp 98.7 ?F (37.1 ?C) (Oral)   Resp 18   Ht 1.753 m ('5\' 9"'$ )   Wt 86.6 kg   SpO2 99%   BMI 28.21 kg/m?  ?Physical Exam ?Vitals and nursing note reviewed.   ?Constitutional:   ?   Appearance: Normal appearance. He is well-developed.  ?HENT:  ?   Head: Atraumatic.  ?   Nose: Nose normal.  ?   Mouth/Throat:  ?   Mouth: Mucous membranes are moist.  ?   Pharynx: Oropharynx is clear.  ?   Comments: Recent oral surgical site currently without active bleeding or acute infection of area noted.  ?Eyes:  ?   General: No scleral icterus. ?   Conjunctiva/sclera: Conjunctivae normal.  ?Neck:  ?   Trachea: No tracheal deviation.  ?Cardiovascular:  ?   Rate and Rhythm: Normal rate.  ?Pulmonary:  ?   Effort: Pulmonary effort is normal. No accessory muscle usage or respiratory distress.  ?Genitourinary: ?   Comments: No cva tenderness. ?Musculoskeletal:  ?   Cervical back: Neck supple.  ?Skin: ?   General: Skin is warm and dry.  ?   Findings: No rash.  ?Neurological:  ?   Mental Status: He is alert.  ?   Comments: Alert, speech clear.   ?Psychiatric:     ?   Mood and Affect: Mood normal.  ? ? ?ED Results / Procedures / Treatments   ?Labs ?(all labs ordered are listed, but only abnormal results are displayed) ?Results for orders placed or performed during the hospital encounter of 05/12/22  ?Basic metabolic panel  ?Result Value Ref Range  ? Sodium 138 135 - 145 mmol/L  ? Potassium 3.8 3.5 - 5.1 mmol/L  ? Chloride 103 98 - 111 mmol/L  ? CO2 28 22 - 32 mmol/L  ? Glucose, Bld 129 (H) 70 - 99 mg/dL  ? BUN 15 8 - 23 mg/dL  ? Creatinine, Ser 1.07 0.61 - 1.24 mg/dL  ? Calcium 10.6 (H) 8.9 - 10.3 mg/dL  ? GFR, Estimated >60 >60 mL/min  ? Anion gap 7 5 - 15  ?CBC with Differential  ?Result Value Ref Range  ? WBC 5.2 4.0 - 10.5 K/uL  ? RBC 4.32 4.22 - 5.81 MIL/uL  ? Hemoglobin 13.6 13.0 - 17.0 g/dL  ? HCT 40.8 39.0 - 52.0 %  ? MCV 94.4 80.0 - 100.0 fL  ? MCH 31.5 26.0 - 34.0 pg  ? MCHC 33.3 30.0 - 36.0 g/dL  ? RDW 12.9 11.5 - 15.5 %  ? Platelets 191 150 - 400 K/uL  ? nRBC 0.0 0.0 - 0.2 %  ? Neutrophils Relative % 67 %  ? Neutro Abs 3.5 1.7 - 7.7 K/uL  ? Lymphocytes Relative 20 %  ? Lymphs Abs 1.0  0.7 - 4.0 K/uL  ? Monocytes Relative 9 %  ? Monocytes Absolute 0.4 0.1 - 1.0 K/uL  ? Eosinophils Relative 3 %  ? Eosinophils Absolute 0.2 0.0 - 0.5 K/uL  ? Basophils Relative 1 %  ? Basophils Absolute 0.1 0.0 -  0.1 K/uL  ? Immature Granulocytes 0 %  ? Abs Immature Granulocytes 0.01 0.00 - 0.07 K/uL  ? ? ? ?EKG ?None ? ?Radiology ?No results found. ? ?Procedures ?Procedures  ? ? ?Medications Ordered in ED ?Medications - No data to display ? ?ED Course/ Medical Decision Making/ A&P ?  ?                        ?Medical Decision Making ?Problems Addressed: ?History of recent dental procedure: acute illness or injury ?Mouth bleeding: acute illness or injury that poses a threat to life or bodily functions ? ?Amount and/or Complexity of Data Reviewed ?External Data Reviewed: notes. ?Labs: ordered. Decision-making details documented in ED Course. ? ? ?Labs sent. ? ?Reviewed nursing notes and prior charts for additional history.  ? ?On exam, bleeding has resolved.  ? ?Labs reviewed/interpreted by me - hgb and plt normal.  ? ?Pt appears stable for d/c.  ? ? ? ? ? ? ? ? ? ?Final Clinical Impression(s) / ED Diagnoses ?Final diagnoses:  ?None  ? ? ?Rx / DC Orders ?ED Discharge Orders   ? ? None  ? ?  ? ? ?  ?Lajean Saver, MD ?05/12/22 1626 ? ?

## 2022-05-12 NOTE — Discharge Instructions (Signed)
It was our pleasure to provide your ER care today - we hope that you feel better. ? ?Your lab work/blood count looks good/normal. ? ?Try to avoid moving/rubbing tongue in area, or any tooth-brushing to area, or anything that would disturb the area or prevent clot from forming.  ? ?If bleeding recurs, hold constant, steady pressure to area without letting up with teabag/gauze for 20 minutes - if bleeding continues, return for recheck. ? ?Follow up with your oral surgeon/dentist as arranged with them. ? ? ?

## 2022-05-12 NOTE — ED Triage Notes (Signed)
Patient reports oral surgery on the 9th and since then has had constant oral bleeding with jelly like clots. States his dentist has been unsuccessful at stopping the bleeding. Is on eliquis and did not take dose today. Denies any dizziness or shortness of breath. Reports history of anemia.  ?

## 2022-05-12 NOTE — ED Provider Triage Note (Signed)
Emergency Medicine Provider Triage Evaluation Note  Derrick [name] , a 81 y.o. male  was evaluated in triage.  Pt complains of continuous bleeding from surgical wound in the left maxillary region.  Patient states that he had oral surgery done 8 days ago and has been consistently bleeding since then.  He has tried numerous modalities to stem the bleeding but will frequently wake up with blood all over his pillow, numerous large clots.  He went to his dentist who performed the surgery and they attempted to put a quick clot powder on the area, however he continued to bleed.  Patient is on Eliquis for CABG and A-fib.  He talked to his cardiologist, Dr. Burt Knack who said that he did not discontinue Eliquis for 2 days only.  He skipped his dose of Eliquis today.  Patient has history of iron deficiency anemia and reports that weakness is his baseline.  Review of Systems  Positive:  Negative:   Physical Exam  BP 130/68 (BP Location: Left Arm)   Pulse 64   Temp 98.6 F (37 C) (Oral)   Resp 16   Ht '5\' 9"'$  (1.753 m)   Wt 86.6 kg   SpO2 99%   BMI 28.21 kg/m  Gen:   Awake, no distress   Resp:  Normal effort  MSK:   Moves extremities without difficulty  Other:  Surgical wound of the left maxillary gums, not currently actively bleeding  Medical Decision Making  Medically screening exam initiated at 12:02 PM.  Appropriate orders placed.  Derrick [name] was informed that the remainder of the evaluation will be completed by another provider, this initial triage assessment does not replace that evaluation, and the importance of remaining in the ED until their evaluation is complete.     Tonye Pearson, [place] 05/12/22 1204

## 2022-05-13 ENCOUNTER — Telehealth: Payer: Self-pay

## 2022-05-13 NOTE — Telephone Encounter (Signed)
-----   Message from Heath Lark, MD sent at 05/13/2022  8:20 AM EDT ----- Pls call and let him know myeloma panel is stable

## 2022-05-13 NOTE — Telephone Encounter (Signed)
Called and given below message. He verbalized understanding. 

## 2022-05-31 ENCOUNTER — Encounter: Payer: Self-pay | Admitting: Cardiovascular Disease

## 2022-05-31 ENCOUNTER — Ambulatory Visit (INDEPENDENT_AMBULATORY_CARE_PROVIDER_SITE_OTHER): Payer: Medicare Other | Admitting: Cardiovascular Disease

## 2022-05-31 ENCOUNTER — Ambulatory Visit (HOSPITAL_COMMUNITY): Payer: Medicare Other | Attending: Cardiovascular Disease

## 2022-05-31 VITALS — BP 120/70 | HR 62 | Ht 69.0 in | Wt 192.8 lb

## 2022-05-31 DIAGNOSIS — I2510 Atherosclerotic heart disease of native coronary artery without angina pectoris: Secondary | ICD-10-CM | POA: Diagnosis not present

## 2022-05-31 DIAGNOSIS — I340 Nonrheumatic mitral (valve) insufficiency: Secondary | ICD-10-CM

## 2022-05-31 DIAGNOSIS — I350 Nonrheumatic aortic (valve) stenosis: Secondary | ICD-10-CM | POA: Insufficient documentation

## 2022-05-31 DIAGNOSIS — I361 Nonrheumatic tricuspid (valve) insufficiency: Secondary | ICD-10-CM

## 2022-05-31 DIAGNOSIS — I480 Paroxysmal atrial fibrillation: Secondary | ICD-10-CM | POA: Diagnosis not present

## 2022-05-31 LAB — ECHOCARDIOGRAM COMPLETE
AR max vel: 1.66 cm2
AV Area VTI: 1.52 cm2
AV Area mean vel: 1.62 cm2
AV Mean grad: 18.7 mmHg
AV Peak grad: 32 mmHg
Ao pk vel: 2.83 m/s
Area-P 1/2: 2.84 cm2
S' Lateral: 2.7 cm

## 2022-05-31 NOTE — Progress Notes (Unsigned)
Cardiology Office Note:    Date:  06/01/2022   ID:  Jobe, Mutch 21-Mar-1941, MRN [id number]  PCP:  [name], MD   [facility] Providers Cardiologist:  [name], MD Electrophysiologist:  [name], MD     Referring MD: [name], MD   Chief Complaint  Patient presents with   Coronary Artery Disease    History of Present Illness:    Derrick [name] is a 81 y.o. 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  He has some functional limitation from leg weakness.  He also complains of fatigue and mild exertional dyspnea.  No chest pain or pressure.  No orthopnea, PND, or leg swelling.  No lightheadedness or heart palpitations.  He has significant fatigue and feels the need to take a nap most days.  Past Medical History:  Diagnosis Date   Anemia    iron deficiency anemia- infusion on 02/15/2020 and 02/22/2020   BENIGN PROSTATIC HYPERTROPHY, WITH OBSTRUCTION 05/28/2010   CAD (coronary artery disease)    a. s/p NSTEMI 06/01/11: DES to RCA;  b. cath 06/25/11:   dLM 50-60% (FFR 0.87), prox to mid LAD 40-50%, D1 50%, pCFX 50%, RCA stent ok, dPDA 80%, EF 55-60%.  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   DISH (diffuse idiopathic skeletal hyperostosis) 12/02/2015   Diverticulitis    GENERALIZED OSTEOARTHROSIS UNSPECIFIED SITE 05/28/2010   GERD 05/28/2010   Heart murmur    History of gout    History of kidney stones    HYPERLIPIDEMIA 05/28/2010   Hypertension    IgM lambda paraproteinemia    Intrinsic asthma, 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   "associated w/lung sarcoidosis"   Malignant neoplasm of descending colon ([place]) 05/28/2010   MITRAL VALVE PROLAPSE 05/28/2010   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   NSTEMI (non-ST elevated myocardial infarction) ([place]) 06/03/11   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   OSA on CPAP 05/28/2010   PALPITATIONS, CHRONIC 05/28/2010   Parotid gland pain 2010   infection   Peripheral neuropathy    "tx'd w/targeted chemo" (05/12/2016)   Pneumonia 2-3 times   Presence of permanent cardiac pacemaker    Medtronic   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   Sarcoidosis    pulmonalis   UNSPECIFIED INFLAMMATORY AND TOXIC NEUROPATHY 05/28/2010   Vision loss    VITAMIN D DEFICIENCY 05/28/2010    Past Surgical History:  Procedure Laterality Date   APPENDECTOMY  1954   age 20   CATARACT EXTRACTION W/ INTRAOCULAR LENS IMPLANT Left    COLON SURGERY  2000   decending colon    CORONARY ANGIOPLASTY WITH STENT PLACEMENT  06/03/11  CORONARY ARTERY BYPASS GRAFT N/A 06/05/2019   Procedure: CORONARY ARTERY BYPASS GRAFTING (CABG) TIMES THREE USING LEFT INTERNAL MAMMARY ARTERY AND RIGHT GREATER SEPHANOUS VEIN;  Surgeon: [name], MD;  Location: [place];  Service: Open Heart Surgery;  Laterality: N/A;   ELECTROPHYSIOLOGIC STUDY N/A 05/12/2016   Procedure: Cardioversion;  Surgeon: [name], MD;  Location: [place] [facility];  Service: Cardiovascular;  Laterality: N/A;   EP IMPLANTABLE DEVICE N/A 05/12/2016   Procedure: Pacemaker Implant;  Surgeon: [name], MD;  Location: [place] [facility];  Service: Cardiovascular;  Laterality: N/A;   EYE SURGERY     bilateral cataract with lens implant   HERNIA REPAIR  01/2019   left inguinal hernia repair   INGUINAL HERNIA REPAIR Left 02/21/2019   Procedure: LEFT INGUINAL HERNIA REPAIR WITH MESH;  Surgeon: [name], MD;  Location: [place];  Service: General;  Laterality: Left;   INSERT / REPLACE / REMOVE PACEMAKER  05/12/2016   INTRAVASCULAR PRESSURE WIRE/FFR STUDY N/A 05/15/2019   Procedure: INTRAVASCULAR PRESSURE WIRE/FFR STUDY;  Surgeon: [name], MD;  Location: [place] [facility];  Service: Cardiovascular;  Laterality: N/A;   KNEE ARTHROSCOPY Right 2003   LEFT HEART CATH AND CORONARY ANGIOGRAPHY N/A 05/15/2019   Procedure: LEFT HEART CATH AND CORONARY ANGIOGRAPHY;  Surgeon: [name], MD;  Location: [place] [facility];  Service: Cardiovascular;  Laterality: N/A;   LEFT HEART CATHETERIZATION WITH CORONARY ANGIOGRAM N/A 03/31/2015   Procedure: LEFT HEART CATHETERIZATION WITH CORONARY ANGIOGRAM;  Surgeon: [name], MD;  Location: [facility];  Service: Cardiovascular;  Laterality: N/A;   LUNG SURGERY  2001   "open lung dissection"   MOHS SURGERY Left ~ 2008   ear   PROSTATE BIOPSY  2010   TEE WITHOUT CARDIOVERSION N/A 06/05/2019   Procedure: TRANSESOPHAGEAL ECHOCARDIOGRAM (TEE);  Surgeon: [name], [name], MD;  Location: [place];  Service: Open Heart Surgery;  Laterality: N/A;   TONSILLECTOMY  1950   TOTAL KNEE ARTHROPLASTY Right 03/11/2020   Procedure: TOTAL KNEE ARTHROPLASTY;  Surgeon: [name], MD;  Location: [facility];  Service: Orthopedics;  Laterality: Right;  70 mins    Current Medications: Current Meds  Medication Sig   albuterol (VENTOLIN HFA) 108 (90 Base) MCG/ACT inhaler Inhale 2 puffs into the lungs every 6 (six) hours as needed for wheezing or shortness of breath. (Patient taking differently: Inhale 2 puffs into the lungs every 6 (six) hours as needed for wheezing  or shortness of breath. Rare use - 1/2 times/annually)   amLODipine (NORVASC) 5 MG tablet Take 1 tablet by mouth once daily   apixaban (ELIQUIS) 5 MG TABS tablet Take 1 tablet by mouth twice daily   bimatoprost (LUMIGAN) 0.01 % SOLN Place 1 drop into both eyes at bedtime.   Cholecalciferol 25 MCG (1000 UT) tablet Take 1,000 Units by mouth daily.    diclofenac Sodium (VOLTAREN) 1 % GEL Apply 2 g topically 4 (four) times daily as needed.   dorzolamide-timolol (COSOPT) 22.3-6.8 MG/ML ophthalmic solution Place 1 drop into both eyes 2 (two) times daily.    finasteride (PROSCAR) 5 MG tablet Take 5 mg by mouth daily after breakfast.    fluticasone (FLONASE) 50 MCG/ACT nasal spray Place 2 sprays into both nostrils 2 (two) times daily.   Fluticasone Propionate, Inhal, (FLOVENT DISKUS) 100 MCG/ACT AEPB Inhale 1 puff into the lungs 2 (two) times daily as needed.   MULTAQ 400 MG tablet TAKE 1 TABLET BY MOUTH  TWICE DAILY WITH MEALS   Multiple Vitamin (MULTIVITAMIN WITH MINERALS) TABS tablet Take 1 tablet by mouth daily.   pantoprazole (PROTONIX) 40 MG tablet Take 1 tablet by mouth once daily   Polyethyl Glycol-Propyl Glycol (SYSTANE OP) Place 1 drop into both eyes daily as needed (dry eyes).   rosuvastatin (CRESTOR) 20 MG tablet Take 1 tablet by mouth once daily   tadalafil (CIALIS) 5 MG tablet Take 5 mg by mouth at bedtime.    triamterene-hydrochlorothiazide (MAXZIDE-25) 37.5-25 MG tablet Take 1 tablet by mouth once daily   [DISCONTINUED] metoprolol succinate (TOPROL-XL) 25 MG 24 hr tablet TAKE 1 TABLET BY MOUTH AT BEDTIME     Allergies:   Nsaids   Social History   Socioeconomic History   Marital status: Married    Spouse name: Not on file   Number of children: 2   Years of education: MS-Coll.   Highest education level: Not on file  Occupational History   Occupation: Consultant    Comment: Building services engineer  Tobacco Use   Smoking status: Never   Smokeless tobacco: Never  Vaping Use    Vaping Use: Never used  Substance and Sexual Activity   Alcohol use: Yes    Comment: occasional   Drug use: No   Sexual activity: Yes    Birth control/protection: None  Other Topics Concern   Not on file  Social History Narrative   College- [employer]; [employer]-environment science and mgt. Married [name])  "23" 1 son- 50; 1 dtr "8" 2 grandchildren. 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 End of life-provided discussive context and provided packet.   Social Determinants of Health   Financial Resource Strain: Medium Risk   Difficulty of Paying Living Expenses: Somewhat hard  Food Insecurity: No Food Insecurity   Worried About Charity fundraiser in the Last Year: Never true   Ran Out of Food in the Last Year: Never true  Transportation Needs: No Transportation Needs   Lack of Transportation (Medical): No   Lack of Transportation (Non-Medical): No  Physical Activity: Sufficiently Active   Days of Exercise per Week: 7 days   Minutes of Exercise per Session: 50 min  Stress: No Stress Concern Present   Feeling of Stress : Not at all  Social Connections: Moderately Integrated   Frequency of Communication with Friends and Family: More than three times a week   Frequency of Social Gatherings with Friends and Family: More than three times a week   Attends Religious Services: More than 4 times per year   Active Member of [employer] or Organizations: No   Attends Music therapist: Never   Marital Status: Married     Family History: The patient's family history includes Arrhythmia (age of onset: 81) in his brother; Arthritis in his mother; Cancer in his brother; Colon polyps in his mother; Congestive Heart Failure in his brother; Coronary artery disease in his brother and mother; Heart attack in his brother; Heart disease in his mother; Sleep apnea in his brother, father, and mother.  ROS:   Please see the history of present illness.    All other systems reviewed and are  negative.  EKGs/Labs/Other Studies Reviewed:    The following studies were reviewed today: I personally reviewed today's echo images and compared those images to his echocardiogram from 1 year ago.  See assessment and plan for findings.  EKG:  EKG is not ordered today.   Recent Labs: 10/30/2021: ALT 15 05/12/2022: BUN 15; Creatinine, Ser  1.07; Hemoglobin 13.6; Platelets 191; Potassium 3.8; Sodium 138  Recent Lipid Panel    Component Value Date/Time   CHOL 173 10/30/2021 1557   CHOL 147 05/21/2020 0721   TRIG 74 10/30/2021 1557   HDL 58 10/30/2021 1557   HDL 66 05/21/2020 0721   CHOLHDL 3.0 10/30/2021 1557   VLDL 9.4 10/22/2020 0823   LDLCALC 99 10/30/2021 1557     Risk Assessment/Calculations:    CHA2DS2-VASc Score =     This indicates a  % annual risk of stroke. The patient's score is based upon:           Physical Exam:    VS:  BP 120/70   Pulse 62   Ht '5\' 9"'$  (1.753 m)   Wt 192 lb 12.8 oz (87.5 kg)   SpO2 99%   BMI 28.47 kg/m     Wt Readings from Last 3 Encounters:  05/31/22 192 lb 12.8 oz (87.5 kg)  05/12/22 191 lb (86.6 kg)  05/07/22 191 lb 9.6 oz (86.9 kg)     GEN:  Well nourished, well developed in no acute distress HEENT: Normal NECK: No JVD; No carotid bruits LYMPHATICS: No lymphadenopathy CARDIAC: RRR, 2/6 systolic murmur heard at the base and the apex RESPIRATORY:  Clear to auscultation without rales, wheezing or rhonchi  ABDOMEN: Soft, non-tender, non-distended MUSCULOSKELETAL:  No edema; No deformity  SKIN: Warm and dry NEUROLOGIC:  Alert and oriented x 3 PSYCHIATRIC:  Normal affect   ASSESSMENT:    1. Nonrheumatic aortic valve stenosis   2. Coronary artery disease involving native coronary artery of native heart without angina pectoris   3. Paroxysmal atrial fibrillation ([name])   4. Nonrheumatic mitral valve regurgitation   5. Nonrheumatic tricuspid valve regurgitation    PLAN:    In order of problems listed above:  The patient has  New York Heart Association functional class II symptoms.  I compared his echo images to his previous echocardiogram.  The patient has moderate aortic stenosis with peak and mean gradients of 34 and 20 mmHg, respectively.  The dimensionless index is 0.39.  This represents no significant change from his previous study.  Previously, he had echo contrast administered and the peak and mean gradients at that time were 41 and 25 mmHg, respectively.  We will continue with echo surveillance which I am going to repeat in 6 months. The patient is clinically stable with no symptoms of angina.  I am going to stop his beta-blocker at his request.  He feels like he is having a lot of side effects from this including progressive fatigue. Stop metoprolol succinate today.  Continue other medications.  Tolerating apixaban without bleeding complications. In reviewing today's echo, the patient has worsening mitral regurgitation.  I would estimate his mitral regurgitation in the moderately severe range.  This is a central jet of mitral regurgitation.  It may be related to a combination of degenerative and functional mitral regurgitation as there are some degenerative leaflet changes appreciated.  There is no clear flail segment.  I am going to review the patient's echo report when the formal interpretation is then.  At a minimum, I will repeat an echocardiogram in 6 months.  I will touch base with the patient after the formal echo report is completed. The patient also has worsening tricuspid regurgitation compared to his previous echo study.  He has a history of a pacemaker lead across the tricuspid valve.  As above, will await the formal report and plan on  a repeat study in 6 months.  Suspect treatment options will be limited.           Medication Adjustments/Labs and Tests Ordered: Current medicines are reviewed at length with the patient today.  Concerns regarding medicines are outlined above.  Orders Placed This  Encounter  Procedures   ECHOCARDIOGRAM COMPLETE   No orders of the defined types were placed in this encounter.   Patient Instructions  Medication Instructions:  STOP Metoprolol Succinate (Toprol XL) *If you need a refill on your cardiac medications before your next appointment, please call your pharmacy*   Lab Work: NONE If you have labs (blood work) drawn today and your tests are completely normal, you will receive your results only by: [facility] (if you have MyChart) OR A paper copy in the mail If you have any lab test that is abnormal or we need to change your treatment, we will call you to review the results.   Testing/Procedures: ECHO (approximately 1 week prior to appt) Your physician has requested that you have an echocardiogram. Echocardiography is a painless test that uses sound waves to create images of your heart. It provides your doctor with information about the size and shape of your heart and how well your heart's chambers and valves are working. This procedure takes approximately one hour. There are no restrictions for this procedure.  Follow-Up: At [facility], you and your health needs are our priority.  As part of our continuing mission to provide you with exceptional heart care, we have created designated Provider Care Teams.  These Care Teams include your primary Cardiologist (physician) and Advanced Practice Providers (APPs -  Physician Assistants and Nurse Practitioners) who all work together to provide you with the care you need, when you need it.  Your next appointment:   6 month(s)  The format for your next appointment:   In Person  Provider:   [name], MD    Important Information About Sugar         Signed, [name], MD  06/01/2022 9:36 AM    [place]

## 2022-05-31 NOTE — Patient Instructions (Signed)
Medication Instructions:  STOP Metoprolol Succinate (Toprol XL) *If you need a refill on your cardiac medications before your next appointment, please call your pharmacy*   Lab Work: NONE If you have labs (blood work) drawn today and your tests are completely normal, you will receive your results only by: [place] (if you have MyChart) OR A paper copy in the mail If you have any lab test that is abnormal or we need to change your treatment, we will call you to review the results.   Testing/Procedures: ECHO (approximately 1 week prior to appt) Your physician has requested that you have an echocardiogram. Echocardiography is a painless test that uses sound waves to create images of your heart. It provides your doctor with information about the size and shape of your heart and how well your heart's chambers and valves are working. This procedure takes approximately one hour. There are no restrictions for this procedure.  Follow-Up: At [facility], you and your health needs are our priority.  As part of our continuing mission to provide you with exceptional heart care, we have created designated Provider Care Teams.  These Care Teams include your primary Cardiologist (physician) and Advanced Practice Providers (APPs -  Physician Assistants and Nurse Practitioners) who all work together to provide you with the care you need, when you need it.  Your next appointment:   6 month(s)  The format for your next appointment:   In Person  Provider:   [name], MD    Important Information About Sugar

## 2022-06-08 ENCOUNTER — Telehealth: Payer: Self-pay | Admitting: Cardiovascular Disease

## 2022-06-08 ENCOUNTER — Telehealth: Payer: Self-pay

## 2022-06-08 ENCOUNTER — Encounter: Payer: Self-pay | Admitting: Cardiovascular Disease

## 2022-06-08 DIAGNOSIS — I2510 Atherosclerotic heart disease of native coronary artery without angina pectoris: Secondary | ICD-10-CM

## 2022-06-08 MED ORDER — SACUBITRIL-VALSARTAN 24-26 MG PO TABS: 1.0000 | ORAL_TABLET | Freq: Two times a day (BID) | ORAL | 3 refills | Status: DC

## 2022-06-08 NOTE — Telephone Encounter (Signed)
Completed-see result note from today (entresto sent to pharmacy)

## 2022-06-08 NOTE — Telephone Encounter (Signed)
Pt c/o medication issue:  1. Name of Medication:    2. How are you currently taking this medication (dosage and times per day)?   3. Are you having a reaction (difficulty breathing--STAT)? no  4. What is your medication issue? Patient calling in bout a new medication he suppose to started. Stated it havent been called in yet. Please advise

## 2022-06-08 NOTE — Telephone Encounter (Signed)
-----   Message from [name], MD sent at 06/06/2022  4:04 PM EDT ----- Results reviewed. Mild LV dysfunction noted. Medical program reviewed. I think he has been intolerant to beta blockers. Recommend STOP amlodipine and START Entresto 24-26 mg BID. Follow-up [facility] for med titration and BMET in 2 weeks. thanks

## 2022-06-08 NOTE — Telephone Encounter (Signed)
Reviewed with patient. He understands and verbally read-back to stop amlodipine and requests that we send the entresto in for 90 days' supply to his pharmacy. Will route to pharmD for appt to manage dosing on this and hopefully get BMET scheduled on same day.

## 2022-06-09 ENCOUNTER — Telehealth: Payer: Self-pay | Admitting: Cardiovascular Disease

## 2022-06-09 NOTE — Telephone Encounter (Signed)
New message    Called patient to schedule his pharmacist appointment and he has a question about taking amlodipine and entresto at the same time.  Patient states that he will get his entresto filled on 06-14-22.  Please advise

## 2022-06-09 NOTE — Telephone Encounter (Signed)
Spoke with patient who states he understands to stop amlodipine, but he had read that if you were currently on an ace inhibitor, you should not start entresto for 36 hours from stopping your last medicine. He wanted to make sure there wasn't a waiting period after stopping Amlodipine. Assured him that it was completely fine as amlodipine is a calcium channel blocker. No further questions.

## 2022-06-21 ENCOUNTER — Ambulatory Visit (INDEPENDENT_AMBULATORY_CARE_PROVIDER_SITE_OTHER): Payer: Medicare Other

## 2022-06-21 DIAGNOSIS — I495 Sick sinus syndrome: Secondary | ICD-10-CM | POA: Diagnosis not present

## 2022-06-23 LAB — CUP PACEART REMOTE DEVICE CHECK
Battery Remaining Longevity: 34 mo
Battery Voltage: 2.96 V
Brady Statistic AP VP Percent: 96.68 %
Brady Statistic AP VS Percent: 0 %
Brady Statistic AS VP Percent: 3.32 %
Brady Statistic AS VS Percent: 0 %
Brady Statistic RA Percent Paced: 95.59 %
Brady Statistic RV Percent Paced: 99.98 %
Date Time Interrogation Session: 20230626194210
Implantable Lead Implant Date: 20170517
Implantable Lead Implant Date: 20170517
Implantable Lead Location: 753859
Implantable Lead Location: 753860
Implantable Lead Model: 5076
Implantable Lead Model: 5076
Implantable Pulse Generator Implant Date: 20170517
Lead Channel Impedance Value: 380 Ohm
Lead Channel Impedance Value: 437 Ohm
Lead Channel Impedance Value: 494 Ohm
Lead Channel Impedance Value: 627 Ohm
Lead Channel Pacing Threshold Amplitude: 0.625 V
Lead Channel Pacing Threshold Amplitude: 0.875 V
Lead Channel Pacing Threshold Pulse Width: 0.4 ms
Lead Channel Pacing Threshold Pulse Width: 0.4 ms
Lead Channel Sensing Intrinsic Amplitude: 1.75 mV
Lead Channel Sensing Intrinsic Amplitude: 1.75 mV
Lead Channel Sensing Intrinsic Amplitude: 5.875 mV
Lead Channel Sensing Intrinsic Amplitude: 5.875 mV
Lead Channel Setting Pacing Amplitude: 2 V
Lead Channel Setting Pacing Amplitude: 2.5 V
Lead Channel Setting Pacing Pulse Width: 0.4 ms
Lead Channel Setting Sensing Sensitivity: 2.8 mV

## 2022-07-02 ENCOUNTER — Other Ambulatory Visit: Payer: Medicare Other | Admitting: *Deleted

## 2022-07-02 ENCOUNTER — Ambulatory Visit (INDEPENDENT_AMBULATORY_CARE_PROVIDER_SITE_OTHER): Payer: Medicare Other | Admitting: Pharmacist

## 2022-07-02 DIAGNOSIS — I5030 Unspecified diastolic (congestive) heart failure: Secondary | ICD-10-CM | POA: Insufficient documentation

## 2022-07-02 DIAGNOSIS — I2510 Atherosclerotic heart disease of native coronary artery without angina pectoris: Secondary | ICD-10-CM

## 2022-07-02 DIAGNOSIS — I5032 Chronic diastolic (congestive) heart failure: Secondary | ICD-10-CM | POA: Diagnosis not present

## 2022-07-02 NOTE — Progress Notes (Signed)
Patient ID: Derrick [name]                 DOB: November 23, 1941                      MRN: [id number]     HPI: Derrick [name] is a 81 y.o. male referred by Dr. Burt Knack to pharmacy clinic for HF medication management. PMH is significant for CAD s/p MI in 2012 treated with DES to RCA, CABG in 05/2019, PAF, HTN, HLD, colon cancer. Beta blocker stopped at last visit with Dr Burt Knack due to fatigue. Most recent LVEF 40-45% on 05/31/22. His amlodipine was subsequently changed to Entresto 24-'26mg'$  BID and pt was referred to PharmD for med follow up.  Pt presents today for follow up. Reports tolerating medications well. Denies dizziness and headaches. Swelling has improved in his ankles, none noted now. Checks BP at home daily, consistently runs 409/81-19J, highest systolic reading he's seen has been 130. Has been paying > $500 for a 3 month supply of his Eliquis, Multaq, and now Entresto due to being in the donut hole.  Current CHF meds: Entresto 24-'26mg'$  BID (7:30am and 5pm) BP goal: <130/45mHg  Family History: Arrhythmia (age of onset: 711 in his brother; Arthritis in his mother; Cancer in his brother; Colon polyps in his mother; Congestive Heart Failure in his brother; Coronary artery disease in his brother and mother; Heart attack in his brother; Heart disease in his mother; Sleep apnea in his brother, father, and mother.  Social History: occasional alcohol use, no tobacco use  Diet:   Exercise: walks 2 miles every morning  Home BP readings:   Wt Readings from Last 3 Encounters:  05/31/22 192 lb 12.8 oz (87.5 kg)  05/12/22 191 lb (86.6 kg)  05/07/22 191 lb 9.6 oz (86.9 kg)   BP Readings from Last 3 Encounters:  05/31/22 120/70  05/12/22 (!) 125/56  05/07/22 131/70   Pulse Readings from Last 3 Encounters:  05/31/22 62  05/12/22 61  05/07/22 72    Renal function: CrCl cannot be calculated (Patient's most recent lab result is older than the maximum 21 days allowed.).  Past Medical History:   Diagnosis Date   Anemia    iron deficiency anemia- infusion on 02/15/2020 and 02/22/2020   BENIGN PROSTATIC HYPERTROPHY, WITH OBSTRUCTION 05/28/2010   CAD (coronary artery disease)    a. s/p NSTEMI 06/01/11: DES to RCA;  b. cath 06/25/11:   dLM 50-60% (FFR 0.87), prox to mid LAD 40-50%, D1 50%, pCFX 50%, RCA stent ok, dPDA 80%, EF 55-60%.  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/16/2014   DISH (diffuse idiopathic skeletal hyperostosis) 12/02/2015   Diverticulitis    GENERALIZED OSTEOARTHROSIS UNSPECIFIED SITE 05/28/2010   GERD 05/28/2010   Heart murmur    History of gout    History of kidney stones    HYPERLIPIDEMIA 05/28/2010   Hypertension    IgM lambda paraproteinemia    Intrinsic asthma, 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   "  associated w/lung sarcoidosis"   Malignant neoplasm of descending colon ([place]) 05/28/2010   MITRAL VALVE PROLAPSE 05/28/2010   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   NSTEMI (non-ST elevated myocardial infarction) ([place]) 06/03/11   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   OSA on CPAP 05/28/2010   PALPITATIONS, CHRONIC 05/28/2010   Parotid gland pain 2010   infection   Peripheral neuropathy    "tx'd w/targeted chemo" (05/12/2016)   Pneumonia 2-3 times   Presence of permanent cardiac pacemaker    [employer]tronic   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 (Patient taking differently: Inhale 2 puffs into the lungs every 6 (six) hours as needed for wheezing or shortness of breath. Rare use - 1/2 times/annually) 8 g 2   apixaban (ELIQUIS) 5 MG TABS tablet Take 1 tablet by mouth twice daily 180 tablet 1   bimatoprost (LUMIGAN) 0.01 % SOLN Place 1 drop into both eyes at bedtime.     Cholecalciferol 25 MCG (1000 UT) tablet Take 1,000 Units by mouth daily.      diclofenac Sodium (VOLTAREN) 1 % GEL Apply 2 g topically 4 (four) times daily as needed.     dorzolamide-timolol (COSOPT) 22.3-6.8 MG/ML ophthalmic solution Place 1 drop into both eyes 2 (two) times daily.      finasteride (PROSCAR) 5 MG tablet Take 5 mg by mouth daily after breakfast.      fluticasone (FLONASE) 50 MCG/ACT nasal spray Place 2 sprays into both nostrils 2 (two) times daily.     Fluticasone Propionate, Inhal, (FLOVENT DISKUS) 100 MCG/ACT AEPB Inhale 1 puff into the lungs 2 (two) times daily as needed. 28 each 2   MULTAQ 400 MG tablet TAKE 1 TABLET BY MOUTH TWICE DAILY WITH MEALS 180 tablet 1   Multiple Vitamin (MULTIVITAMIN WITH MINERALS) TABS tablet Take 1 tablet by mouth daily.     pantoprazole (PROTONIX) 40 MG tablet Take 1 tablet by mouth once daily 90 tablet 3   Polyethyl Glycol-Propyl Glycol (SYSTANE OP) Place 1 drop into both eyes daily as needed (dry eyes).     rosuvastatin (CRESTOR) 20 MG tablet Take 1 tablet by mouth once daily 90 tablet 3   sacubitril-valsartan (ENTRESTO) 24-26 MG Take 1 tablet by mouth 2 (two) times daily. 180 tablet 3   tadalafil (CIALIS) 5 MG tablet Take 5 mg by mouth at bedtime.      triamterene-hydrochlorothiazide (MAXZIDE-25) 37.5-25 MG tablet Take 1 tablet by mouth once daily 90 tablet 0   No current facility-administered  medications on file prior to visit.    Allergies  Allergen Reactions   Nsaids     Contraindicated due to being on Eliquis.     Assessment/Plan:  1. HFmrEF - Most recent EF 40-45%, BP excellent today at goal < 130/45mHg. [facility]o to ARB as pt is paying > $500/3 month supply for 3 branded medications now. He wishes to remain on Entresto until he has his follow up echo in December and can re-evaluate at that time. Will have pt fill out pt assistance  applications for [employer] and Eliquis. He is aware to drop off tax return documents and out of pocket pharmacy expenses to the office.  Carnell Beavers E. Kieran Arreguin, PharmD, BCACP, [facility] [address], [place], [place] [address] Phone: [phone]; Fax: [phone] 07/02/2022 2:45 PM

## 2022-07-03 LAB — BASIC METABOLIC PANEL
BUN/Creatinine Ratio: 18 (ref 10–24)
BUN: 18 mg/dL (ref 8–27)
CO2: 23 mmol/L (ref 20–29)
Calcium: 10.1 mg/dL (ref 8.6–10.2)
Chloride: 105 mmol/L (ref 96–106)
Creatinine, Ser: 1.01 mg/dL (ref 0.76–1.27)
Glucose: 112 mg/dL — ABNORMAL HIGH (ref 70–99)
Potassium: 3.9 mmol/L (ref 3.5–5.2)
Sodium: 142 mmol/L (ref 134–144)
eGFR: 76 mL/min/{1.73_m2} (ref >59)

## 2022-07-13 ENCOUNTER — Telehealth: Payer: Self-pay

## 2022-07-13 NOTE — Progress Notes (Signed)
Chronic Care Management Pharmacy Assistant   Name: Derrick [name]  MRN: [id number] DOB: Apr 19, 1941  Reason for Encounter:  CCM (General Adherence)  Recent office visits:  None is previous six months  Recent consult visits:  07/02/22 Fuller [place], [facility] (Cardiology) CHF No med changes 06/08/22 Cardiology: Telephone: Mild LV dysfunction noted "I think he has been intolerant to beta blockers. Recommend STOP amlodipine and START Entresto 24-26 mg BID. Follow-up PharmD Clinic for med titration and BMET in 2 weeks." 05/31/22 [name], MD (Cardiology) Nonrheumatic aortic valve stenosis Ordered; Echocardiogram - Results: worsening mitral regurgitation and worsening tricuspid regurgitation. Stop Metoprolol 25 mg FU 6 months 05/11/22 [name], MD (Cardiology) Message Hold: Eliquis for 48 hours due to bleeding from oral surgery. 05/07/22 Heath Lark, MD (Hematology) Monoclonal gammopathy No med changes 04/29/22 Leigh Aurora (Rheumatology) Diffuse idiopathic skeletal hyperostosis No other info  Hospital visits:  05/12/22 [facility] - Discharged same day Patient presents c/o recurrent bleeding from recent oral surgical procedure site left side of mouth. No med changes  Medications: Outpatient Encounter Medications as of 07/13/2022  Medication Sig   albuterol (VENTOLIN HFA) 108 (90 Base) MCG/ACT inhaler Inhale 2 puffs into the lungs every 6 (six) hours as needed for wheezing or shortness of breath. (Patient taking differently: Inhale 2 puffs into the lungs every 6 (six) hours as needed for wheezing or shortness of breath. Rare use - 1/2 times/annually)   apixaban (ELIQUIS) 5 MG TABS tablet Take 1 tablet by mouth twice daily   bimatoprost (LUMIGAN) 0.01 % SOLN Place 1 drop into both eyes at bedtime.   Cholecalciferol 25 MCG (1000 UT) tablet Take 1,000 Units by mouth daily.    diclofenac Sodium (VOLTAREN) 1 % GEL Apply 2 g topically 4 (four) times daily as needed.    dorzolamide-timolol (COSOPT) 22.3-6.8 MG/ML ophthalmic solution Place 1 drop into both eyes 2 (two) times daily.    finasteride (PROSCAR) 5 MG tablet Take 5 mg by mouth daily after breakfast.    fluticasone (FLONASE) 50 MCG/ACT nasal spray Place 2 sprays into both nostrils 2 (two) times daily.   Fluticasone Propionate, Inhal, (FLOVENT DISKUS) 100 MCG/ACT AEPB Inhale 1 puff into the lungs 2 (two) times daily as needed.   MULTAQ 400 MG tablet TAKE 1 TABLET BY MOUTH TWICE DAILY WITH MEALS   Multiple Vitamin (MULTIVITAMIN WITH MINERALS) TABS tablet Take 1 tablet by mouth daily.   pantoprazole (PROTONIX) 40 MG tablet Take 1 tablet by mouth once daily   Polyethyl Glycol-Propyl Glycol (SYSTANE OP) Place 1 drop into both eyes daily as needed (dry eyes).   rosuvastatin (CRESTOR) 20 MG tablet Take 1 tablet by mouth once daily   sacubitril-valsartan (ENTRESTO) 24-26 MG Take 1 tablet by mouth 2 (two) times daily.   tadalafil (CIALIS) 5 MG tablet Take 5 mg by mouth at bedtime.    triamterene-hydrochlorothiazide (MAXZIDE-25) 37.5-25 MG tablet Take 1 tablet by mouth once daily   No facility-administered encounter medications on file as of 07/13/2022.    Contacted [place] on 07/16/2022 for general disease state and medication adherence call.   Patient is not more than 5 days past due for refill on the following medications per chart history:  [facility] Medications: Medication Name/mg  Last Fill Days Supply Rosuvastatin 20 mg   04/29/22 90 Sacubitril-Valsartan 24-26 mg 07/06/22 90  What concerns do you have about your medications? No concerns at this time  The patient denies side effects with their medications.  How often do you forget or accidentally miss a dose? Rarely  Do you use a pillbox? Yes  Are you having any problems getting your medications from your pharmacy? No  Has the cost of your medications been a concern? No  Since last visit with CPP, the following interventions have been  made.  Stop Metoprolol 25 mg   The patient has had an ED visit since last contact.   The patient denies problems with their health.   Patient denies concerns or questions for Charlene Brooke, PharmD at this time.   Care Gaps: Annual wellness visit in last year? Yes 10/22/2021 Most Recent BP reading: 110/60 on 07/02/2022  Upcoming appointments: No appointments scheduled within the next 30 days.  Charlene Brooke, CPP notified  Marijean Niemann, [place] Clinical Pharmacy Assistant [phone]

## 2022-07-15 NOTE — Progress Notes (Signed)
Remote pacemaker transmission.   

## 2022-07-23 ENCOUNTER — Other Ambulatory Visit: Payer: Self-pay | Admitting: Cardiovascular Disease

## 2022-07-25 ENCOUNTER — Other Ambulatory Visit: Payer: Self-pay | Admitting: Cardiology

## 2022-07-25 DIAGNOSIS — I4891 Unspecified atrial fibrillation: Secondary | ICD-10-CM

## 2022-07-26 ENCOUNTER — Other Ambulatory Visit: Payer: Self-pay

## 2022-07-26 MED ORDER — MULTAQ 400 MG PO TABS: 400.0000 mg | ORAL_TABLET | Freq: Two times a day (BID) | ORAL | 0 refills | Status: DC

## 2022-07-26 NOTE — Telephone Encounter (Signed)
Prescription refill request for Eliquis received. Indication: PAF Last office visit: 05/31/22  Ezzie Dural MD Scr: 1.01 on 07/02/22 Age:  82 Weight: 87.5kg  Based on above findings Eliquis '5mg'$  twice daily is the appropriate dose.  Refill approved.

## 2022-07-27 ENCOUNTER — Other Ambulatory Visit: Payer: Self-pay | Admitting: *Deleted

## 2022-07-27 DIAGNOSIS — I4891 Unspecified atrial fibrillation: Secondary | ICD-10-CM

## 2022-07-27 MED ORDER — APIXABAN 5 MG PO TABS: 5.0000 mg | ORAL_TABLET | Freq: Two times a day (BID) | ORAL | 1 refills | Status: DC

## 2022-07-27 NOTE — Telephone Encounter (Signed)
Eliquis '5mg'$  refill request received. Patient is 81 years old, weight-85.7kg, Crea-1.01 on 07/02/2022, Diagnosis-Afib, and last seen by Dr. Burt Knack on 05/31/2022. Dose is appropriate based on dosing criteria. Will send in refill to requested pharmacy.

## 2022-08-04 ENCOUNTER — Telehealth: Payer: Self-pay | Admitting: Pharmacist

## 2022-08-04 NOTE — Telephone Encounter (Signed)
Called pt to see if he is planning on dropping off documentation needed for Eliquis and Entresto pt assistance applications. Reports he just had rx filled and needed those for his expense report. Plans on dropping forms off next week to the office and can be submitted then. Has been feeling well on his meds, BP as low as 101/51 when he had a virus recently, otherwise ranging 101-110/60.

## 2022-08-31 NOTE — Telephone Encounter (Signed)
Patient denied Pt assistance for Eliquis and Entresto due to income being over current eligibility limits.

## 2022-09-01 NOTE — Telephone Encounter (Signed)
Patient made aware that his income exceeded the limit for pt assistance. He states for now he is ok paying for medication.

## 2022-09-13 DIAGNOSIS — Z23 Encounter for immunization: Secondary | ICD-10-CM | POA: Diagnosis not present

## 2022-09-20 ENCOUNTER — Ambulatory Visit (INDEPENDENT_AMBULATORY_CARE_PROVIDER_SITE_OTHER): Payer: Medicare Other

## 2022-09-20 DIAGNOSIS — I495 Sick sinus syndrome: Secondary | ICD-10-CM

## 2022-09-20 DIAGNOSIS — Z23 Encounter for immunization: Secondary | ICD-10-CM | POA: Diagnosis not present

## 2022-09-20 DIAGNOSIS — I442 Atrioventricular block, complete: Secondary | ICD-10-CM

## 2022-09-21 ENCOUNTER — Encounter: Payer: Self-pay | Admitting: Cardiology

## 2022-09-21 ENCOUNTER — Encounter: Payer: Self-pay | Admitting: *Deleted

## 2022-09-21 ENCOUNTER — Encounter: Payer: Self-pay | Admitting: Family Medicine

## 2022-09-21 ENCOUNTER — Ambulatory Visit: Payer: Medicare Other | Attending: Cardiology | Admitting: Cardiology

## 2022-09-21 VITALS — BP 134/66 | HR 65 | Ht 70.0 in | Wt 188.0 lb

## 2022-09-21 DIAGNOSIS — I2510 Atherosclerotic heart disease of native coronary artery without angina pectoris: Secondary | ICD-10-CM | POA: Diagnosis not present

## 2022-09-21 DIAGNOSIS — D6869 Other thrombophilia: Secondary | ICD-10-CM | POA: Diagnosis not present

## 2022-09-21 DIAGNOSIS — I5022 Chronic systolic (congestive) heart failure: Secondary | ICD-10-CM | POA: Diagnosis not present

## 2022-09-21 DIAGNOSIS — Z01812 Encounter for preprocedural laboratory examination: Secondary | ICD-10-CM | POA: Diagnosis not present

## 2022-09-21 DIAGNOSIS — I495 Sick sinus syndrome: Secondary | ICD-10-CM | POA: Diagnosis not present

## 2022-09-21 DIAGNOSIS — I480 Paroxysmal atrial fibrillation: Secondary | ICD-10-CM | POA: Insufficient documentation

## 2022-09-21 LAB — CUP PACEART REMOTE DEVICE CHECK
Battery Remaining Longevity: 31 mo
Battery Voltage: 2.95 V
Brady Statistic AP VP Percent: 95.06 %
Brady Statistic AP VS Percent: 0 %
Brady Statistic AS VP Percent: 4.93 %
Brady Statistic AS VS Percent: 0 %
Brady Statistic RA Percent Paced: 94.35 %
Brady Statistic RV Percent Paced: 99.98 %
Date Time Interrogation Session: 20230925101101
Implantable Lead Implant Date: 20170517
Implantable Lead Implant Date: 20170517
Implantable Lead Location: 753859
Implantable Lead Location: 753860
Implantable Lead Model: 5076
Implantable Lead Model: 5076
Implantable Pulse Generator Implant Date: 20170517
Lead Channel Impedance Value: 361 Ohm
Lead Channel Impedance Value: 418 Ohm
Lead Channel Impedance Value: 494 Ohm
Lead Channel Impedance Value: 627 Ohm
Lead Channel Pacing Threshold Amplitude: 0.625 V
Lead Channel Pacing Threshold Amplitude: 0.75 V
Lead Channel Pacing Threshold Pulse Width: 0.4 ms
Lead Channel Pacing Threshold Pulse Width: 0.4 ms
Lead Channel Sensing Intrinsic Amplitude: 1.625 mV
Lead Channel Sensing Intrinsic Amplitude: 1.625 mV
Lead Channel Sensing Intrinsic Amplitude: 5.875 mV
Lead Channel Sensing Intrinsic Amplitude: 5.875 mV
Lead Channel Setting Pacing Amplitude: 2 V
Lead Channel Setting Pacing Amplitude: 2.5 V
Lead Channel Setting Pacing Pulse Width: 0.4 ms
Lead Channel Setting Sensing Sensitivity: 2.8 mV

## 2022-09-21 LAB — CBC
Hematocrit: 37.9 % (ref 37.5–51.0)
Hemoglobin: 12.7 g/dL — ABNORMAL LOW (ref 13.0–17.7)
MCH: 30.8 pg (ref 26.6–33.0)
MCHC: 33.5 g/dL (ref 31.5–35.7)
MCV: 92 fL (ref 79–97)
Platelets: 154 10*3/uL (ref 150–450)
RBC: 4.12 x10E6/uL — ABNORMAL LOW (ref 4.14–5.80)
RDW: 13.7 % (ref 11.6–15.4)
WBC: 4.4 10*3/uL (ref 3.4–10.8)

## 2022-09-21 LAB — BASIC METABOLIC PANEL
BUN/Creatinine Ratio: 18 (ref 10–24)
BUN: 20 mg/dL (ref 8–27)
CO2: 28 mmol/L (ref 20–29)
Calcium: 10.4 mg/dL — ABNORMAL HIGH (ref 8.6–10.2)
Chloride: 102 mmol/L (ref 96–106)
Creatinine, Ser: 1.12 mg/dL (ref 0.76–1.27)
Glucose: 81 mg/dL (ref 70–99)
Potassium: 4.3 mmol/L (ref 3.5–5.2)
Sodium: 137 mmol/L (ref 134–144)
eGFR: 67 mL/min/{1.73_m2} (ref >59)

## 2022-09-21 NOTE — Patient Instructions (Addendum)
Medication Instructions:  Your physician recommends that you continue on your current medications as directed. Please refer to the Current Medication list given to you today.  *If you need a refill on your cardiac medications before your next appointment, please call your pharmacy*   Lab Work: Pre procedure labs today: BMET & CBC  If you have labs (blood work) drawn today and your tests are completely normal, you will receive your results only by: [facility] (if you have MyChart) OR A paper copy in the mail If you have any lab test that is abnormal or we need to change your treatment, we will call you to review the results.   Testing/Procedures: None ordered   Follow-Up: At [facility], you and your health needs are our priority.  As part of our continuing mission to provide you with exceptional heart care, we have created designated Provider Care Teams.  These Care Teams include your primary Cardiologist (physician) and Advanced Practice Providers (APPs -  Physician Assistants and Nurse Practitioners) who all work together to provide you with the care you need, when you need it.   Your next appointment:   3 week(s)  The format for your next appointment:   In Person  Provider:   With device clinic for a wound check     Thank you for choosing [facility]!!   Trinidad Curet, RN [phone]  Other Instructions   Important Information About Sugar

## 2022-09-21 NOTE — H&P (View-Only) (Signed)
Electrophysiology Office Note   Date:  09/21/2022   ID:  Derrick, [name] 1941/04/26, MRN [id number]  PCP:  [name], MD  Cardiologist:  Burt Knack Primary Electrophysiologist:  Gregg Holster [name], MD    No chief complaint on file.     History of Present Illness: Derrick [name] is a 81 y.o. male who presents today for electrophysiology evaluation.     He has a history significant for coronary artery disease status post RCA stent, CABG, PE, pulmonary sarcoidosis, MDS.  He has sick sinus syndrome and is status post Medtronic dual-chamber pacemaker.  He also has atrial fibrillation and is on Multaq.  Today, denies symptoms of palpitations, chest pain, orthopnea, PND, lower extremity edema, claudication, dizziness, presyncope, syncope, bleeding, or neurologic sequela. The patient is tolerating medications without difficulties.  Feels well at rest, but does develop fatigue and weakness in his legs when he is exerting himself.  He walks 2 miles a day, but has trouble getting home at the end of his walk.  He states his wife walks quite a bit faster than he does.   Past Medical History:  Diagnosis Date   Anemia    iron deficiency anemia- infusion on 02/15/2020 and 02/22/2020   BENIGN PROSTATIC HYPERTROPHY, WITH OBSTRUCTION 05/28/2010   CAD (coronary artery disease)    a. s/p NSTEMI 06/01/11: DES to RCA;  b. cath 06/25/11:   dLM 50-60% (FFR 0.87), prox to mid LAD 40-50%, D1 50%, pCFX 50%, RCA stent ok, dPDA 80%, EF 55-60%.  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   DISH (diffuse idiopathic skeletal hyperostosis) 12/02/2015   Diverticulitis    GENERALIZED OSTEOARTHROSIS UNSPECIFIED SITE 05/28/2010   GERD 05/28/2010   Heart murmur    History of gout    History of kidney stones    HYPERLIPIDEMIA 05/28/2010   Hypertension    IgM lambda paraproteinemia    Intrinsic asthma, 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   "associated w/lung sarcoidosis"   Malignant neoplasm of descending colon ([place]) 05/28/2010   MITRAL VALVE PROLAPSE 05/28/2010   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   NSTEMI (non-ST elevated myocardial infarction) ([place]) 06/03/11   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   OSA on CPAP 05/28/2010   PALPITATIONS, CHRONIC 05/28/2010   Parotid gland pain 2010   infection   Peripheral neuropathy    "tx'd w/targeted chemo" (05/12/2016)   Pneumonia 2-3 times   Presence of permanent cardiac pacemaker    Medtronic   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   Sarcoidosis    pulmonalis   UNSPECIFIED INFLAMMATORY AND TOXIC NEUROPATHY 05/28/2010   Vision loss    VITAMIN D DEFICIENCY 05/28/2010   Past Surgical History:  Procedure Laterality Date   APPENDECTOMY  1954   age 80   CATARACT EXTRACTION W/ INTRAOCULAR LENS IMPLANT Left    COLON SURGERY  2000   decending colon    CORONARY ANGIOPLASTY WITH STENT PLACEMENT  06/03/11   CORONARY ARTERY BYPASS GRAFT N/A 06/05/2019  Procedure: CORONARY ARTERY BYPASS GRAFTING (CABG) TIMES THREE USING LEFT INTERNAL MAMMARY ARTERY AND RIGHT GREATER SEPHANOUS VEIN;  Surgeon: [name], MD;  Location: [facility];  Service: Open Heart Surgery;  Laterality: N/A;   ELECTROPHYSIOLOGIC STUDY N/A 05/12/2016   Procedure: Cardioversion;  Surgeon: Zubayr Bednarczyk [name], MD;  Location: [facility];  Service: Cardiovascular;  Laterality: N/A;   EP IMPLANTABLE DEVICE N/A 05/12/2016   Procedure: Pacemaker Implant;  Surgeon: Effie Janoski [name], MD;  Location: [facility];   Service: Cardiovascular;  Laterality: N/A;   EYE SURGERY     bilateral cataract with lens implant   HERNIA REPAIR  01/2019   left inguinal hernia repair   INGUINAL HERNIA REPAIR Left 02/21/2019   Procedure: LEFT INGUINAL HERNIA REPAIR WITH MESH;  Surgeon: [name], MD;  Location: [facility];  Service: General;  Laterality: Left;   INSERT / REPLACE / REMOVE PACEMAKER  05/12/2016   INTRAVASCULAR PRESSURE WIRE/FFR STUDY N/A 05/15/2019   Procedure: INTRAVASCULAR PRESSURE WIRE/FFR STUDY;  Surgeon: [name], MD;  Location: [facility];  Service: Cardiovascular;  Laterality: N/A;   KNEE ARTHROSCOPY Right 2003   LEFT HEART CATH AND CORONARY ANGIOGRAPHY N/A 05/15/2019   Procedure: LEFT HEART CATH AND CORONARY ANGIOGRAPHY;  Surgeon: [name], MD;  Location: [facility];  Service: Cardiovascular;  Laterality: N/A;   LEFT HEART CATHETERIZATION WITH CORONARY ANGIOGRAM N/A 03/31/2015   Procedure: LEFT HEART CATHETERIZATION WITH CORONARY ANGIOGRAM;  Surgeon: [name], MD;  Location: [facility];  Service: Cardiovascular;  Laterality: N/A;   LUNG SURGERY  2001   "open lung dissection"   MOHS SURGERY Left ~ 2008   ear   PROSTATE BIOPSY  2010   TEE WITHOUT CARDIOVERSION N/A 06/05/2019   Procedure: TRANSESOPHAGEAL ECHOCARDIOGRAM (TEE);  Surgeon: [name], [name], MD;  Location: [facility];  Service: Open Heart Surgery;  Laterality: N/A;   TONSILLECTOMY  1950   TOTAL KNEE ARTHROPLASTY Right 03/11/2020   Procedure: TOTAL KNEE ARTHROPLASTY;  Surgeon: [name], MD;  Location: [facility];  Service: Orthopedics;  Laterality: Right;  70 mins     Current Outpatient Medications  Medication Sig Dispense Refill   albuterol (VENTOLIN HFA) 108 (90 Base) MCG/ACT inhaler Inhale 2 puffs into the lungs every 6 (six) hours as needed for wheezing or shortness of breath. 8 g 2   amoxicillin (AMOXIL) 500 MG capsule TAKE 2 TABLETS BY MOUTH 1 HOUR PRIOR TO DENTAL APPOINTMENT AND 2 TABLETS IMMEDIATELY  FOLLOWING DENTAL APPOINTMENT     apixaban (ELIQUIS) 5 MG TABS tablet Take 1 tablet (5 mg total) by mouth 2 (two) times daily. 180 tablet 1   bimatoprost (LUMIGAN) 0.01 % SOLN Place 1 drop into both eyes at bedtime.     Cholecalciferol 25 MCG (1000 UT) tablet Take 1,000 Units by mouth daily.      diclofenac Sodium (VOLTAREN) 1 % GEL Apply 2 g topically 4 (four) times daily as needed.     dorzolamide-timolol (COSOPT) 22.3-6.8 MG/ML ophthalmic solution Place 1 drop into both eyes 2 (two) times daily.      dronedarone (MULTAQ) 400 MG tablet Take 1 tablet (400 mg total) by mouth 2 (two) times daily with a meal. 180 tablet 0   finasteride (PROSCAR) 5 MG tablet Take 5 mg by mouth daily after breakfast.      fluticasone (FLONASE) 50 MCG/ACT nasal spray Place 2 sprays into both nostrils 2 (two) times daily.     Fluticasone Propionate,  Inhal, (FLOVENT DISKUS) 100 MCG/ACT AEPB Inhale 1 puff into the lungs 2 (two) times daily as needed. 28 each 2   Multiple Vitamin (MULTIVITAMIN WITH MINERALS) TABS tablet Take 1 tablet by mouth daily.     pantoprazole (PROTONIX) 40 MG tablet Take 1 tablet by mouth once daily 90 tablet 3   Polyethyl Glycol-Propyl Glycol (SYSTANE OP) Place 1 drop into both eyes daily as needed (dry eyes).     rosuvastatin (CRESTOR) 20 MG tablet Take 1 tablet by mouth once daily 90 tablet 3   sacubitril-valsartan (ENTRESTO) 24-26 MG Take 1 tablet by mouth 2 (two) times daily. 180 tablet 3   tadalafil (CIALIS) 5 MG tablet Take 5 mg by mouth at bedtime.      triamterene-hydrochlorothiazide (MAXZIDE-25) 37.5-25 MG tablet Take 1 tablet by mouth once daily 90 tablet 3   No current facility-administered medications for this visit.    Allergies:   Nsaids   Social History:  The patient  reports that he has never smoked. He has never used smokeless tobacco. He reports current alcohol use. He reports that he does not use drugs.   Family History:  The patient's family history includes Arrhythmia (age  of onset: 27) in his brother; Arthritis in his mother; Cancer in his brother; Colon polyps in his mother; Congestive Heart Failure in his brother; Coronary artery disease in his brother and mother; Heart attack in his brother; Heart disease in his mother; Sleep apnea in his brother, father, and mother.   ROS:  Please see the history of present illness.   Otherwise, review of systems is positive for none.   All other systems are reviewed and negative.   PHYSICAL EXAM: VS:  BP 134/66   Pulse 65   Ht '5\' 10"'$  (1.778 m)   Wt 188 lb (85.3 kg)   SpO2 98%   BMI 26.98 kg/m  , BMI Body mass index is 26.98 kg/m. GEN: Well nourished, well developed, in no acute distress  HEENT: normal  Neck: no JVD, carotid bruits, or masses Cardiac: RRR; no murmurs, rubs, or gallops,no edema  Respiratory:  clear to auscultation bilaterally, normal work of breathing GI: soft, nontender, nondistended, + BS MS: no deformity or atrophy  Skin: warm and dry, device site well healed Neuro:  Strength and sensation are intact Psych: euthymic mood, full affect  EKG:  EKG is ordered today. Personal review of the ekg ordered shows AV paced  Personal review of the device interrogation today. Results in [facility]: 10/30/2021: ALT 15 05/12/2022: Hemoglobin 13.6; Platelets 191 07/02/2022: BUN 18; Creatinine, Ser 1.01; Potassium 3.9; Sodium 142    Lipid Panel     Component Value Date/Time   CHOL 173 10/30/2021 1557   CHOL 147 05/21/2020 0721   TRIG 74 10/30/2021 1557   HDL 58 10/30/2021 1557   HDL 66 05/21/2020 0721   CHOLHDL 3.0 10/30/2021 1557   VLDL 9.4 10/22/2020 0823   LDLCALC 99 10/30/2021 1557     Wt Readings from Last 3 Encounters:  09/21/22 188 lb (85.3 kg)  07/02/22 189 lb (85.7 kg)  05/31/22 192 lb 12.8 oz (87.5 kg)      Other studies Reviewed: Additional studies/ records that were reviewed today include: TTE 06/10/21 Review of the above records today demonstrates:   1. Left ventricular  ejection fraction, by estimation, is 60 to 65%. The  left ventricle has normal function. The left ventricle has no regional  wall motion abnormalities. Left ventricular diastolic parameters  are  consistent with Grade I diastolic  dysfunction (impaired relaxation). Elevated left ventricular end-diastolic  pressure. The E/e' is 61.   2. Right ventricular systolic function is normal. The right ventricular  size is normal. There is normal pulmonary artery systolic pressure. The  estimated right ventricular systolic pressure is 97.0 mmHg.   3. Left atrial size was mildly dilated.   4. The mitral valve is abnormal. Mild to moderate mitral valve  regurgitation.   5. The aortic valve is tricuspid. Aortic valve regurgitation is not  visualized. Mild to moderate aortic valve stenosis. Aortic valve area, by  VTI measures 1.82 cm. Aortic valve mean gradient measures 25.0 mmHg.  Aortic valve Vmax measures 3.18 m/s. Sharma Covert  is 0.40.   6. Aortic dilatation noted. There is mild dilatation of the ascending  aorta, measuring 42 mm.   7. The inferior vena cava is normal in size with greater than 50%  respiratory variability, suggesting right atrial pressure of 3 mmHg.   ASSESSMENT AND PLAN:  1.  Paroxysmal atrial fibrillation: Currently on Eliquis 5 mg twice daily, Multaq 400 mg twice daily.  High risk medication monitoring via ECG for Multaq.  CHA2DS2-VASc of 3.  He is on Multaq and his ejection fraction has gone down.  We Chimene Salo continue Multaq at the current dose and plan for biventricular upgrade.  If ejection fraction remains low, we Cherye Gaertner need to find another antiarrhythmic medication.  2.  Hypertension: Currently well controlled  3.  Hyperlipidemia: Continue Crestor 40 mg daily  4.  Coronary artery disease: Status post CABG.  No current chest pain.  5.  Sick sinus syndrome/complete heart block: Status post Medtronic dual-chamber pacemaker implanted 05/12/2016.  Device functioning appropriately.  On 4 L  ejection fraction has gone down.  He is 100% ventricular paced.  Due to that, we Darenda Fike plan for CRT-P upgrade.  Risk and benefits of been discussed risk of bleeding, tamponade, infection, pneumothorax, lead dislodgment.  He understands these risks and is agreed to the procedure.  6.  Nonsustained VT: Noted on device interrogation.  Short episodes asymptomatic.  No changes.  7.  Moderate aortic stenosis: No change on most recent echo.  8.  Secondary hypercoagulable state: Currently on Eliquis for atrial fibrillation as above   Current medicines are reviewed at length with the patient today.   The patient does not have concerns regarding his medicines.  The following changes were made today: none  Labs/ tests ordered today include:  Orders Placed This Encounter  Procedures   Basic metabolic panel   CBC   EKG 12-Lead      Disposition:   FU with Lysa Livengood 3 months  Signed, Malkia Nippert [name], MD  09/21/2022 2:41 PM     [facility] [address] [place] [address] [phone] (office) [phone] (fax)

## 2022-09-21 NOTE — Progress Notes (Signed)
Electrophysiology Office Note   Date:  09/21/2022   ID:  [name], [name] [birth year], MRN [id number]  PCP:  [name], MD  Cardiologist:  Burt Knack Primary Electrophysiologist:  Armondo Cech [name], MD    No chief complaint on file.     History of Present Illness: Derrick [name] is a 81 y.o. male who presents today for electrophysiology evaluation.     He has a history significant for coronary artery disease status post RCA stent, CABG, PE, pulmonary sarcoidosis, MDS.  He has sick sinus syndrome and is status post Medtronic dual-chamber pacemaker.  He also has atrial fibrillation and is on Multaq.  Today, denies symptoms of palpitations, chest pain, orthopnea, PND, lower extremity edema, claudication, dizziness, presyncope, syncope, bleeding, or neurologic sequela. The patient is tolerating medications without difficulties.  Feels well at rest, but does develop fatigue and weakness in his legs when he is exerting himself.  He walks 2 miles a day, but has trouble getting home at the end of his walk.  He states his wife walks quite a bit faster than he does.   Past Medical History:  Diagnosis Date   Anemia    iron deficiency anemia- infusion on 02/15/2020 and 02/22/2020   BENIGN PROSTATIC HYPERTROPHY, WITH OBSTRUCTION 05/28/2010   CAD (coronary artery disease)    a. s/p NSTEMI 06/01/11: DES to RCA;  b. cath 06/25/11:   dLM 50-60% (FFR 0.87), prox to mid LAD 40-50%, D1 50%, pCFX 50%, RCA stent ok, dPDA 80%, EF 55-60%.  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   DISH (diffuse idiopathic skeletal hyperostosis) 12/02/2015   Diverticulitis    GENERALIZED OSTEOARTHROSIS UNSPECIFIED SITE 05/28/2010   GERD 05/28/2010   Heart murmur    History of gout    History of kidney stones    HYPERLIPIDEMIA 05/28/2010   Hypertension    IgM lambda paraproteinemia    Intrinsic asthma, 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   "associated w/lung sarcoidosis"   Malignant neoplasm of descending colon ([place]) 05/28/2010   MITRAL VALVE PROLAPSE 05/28/2010   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   NSTEMI (non-ST elevated myocardial infarction) ([place]) 06/03/11   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   OSA on CPAP 05/28/2010   PALPITATIONS, CHRONIC 05/28/2010   Parotid gland pain 2010   infection   Peripheral neuropathy    "tx'd w/targeted chemo" (05/12/2016)   Pneumonia 2-3 times   Presence of permanent cardiac pacemaker    Medtronic   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   Sarcoidosis    pulmonalis   UNSPECIFIED INFLAMMATORY AND TOXIC NEUROPATHY 05/28/2010   Vision loss    VITAMIN D DEFICIENCY 05/28/2010   Past Surgical History:  Procedure Laterality Date   APPENDECTOMY  1954   age 51   CATARACT EXTRACTION W/ INTRAOCULAR LENS IMPLANT Left    COLON SURGERY  2000   decending colon    CORONARY ANGIOPLASTY WITH STENT PLACEMENT  06/03/11   CORONARY ARTERY BYPASS GRAFT N/A 06/05/2019  Procedure: CORONARY ARTERY BYPASS GRAFTING (CABG) TIMES THREE USING LEFT INTERNAL MAMMARY ARTERY AND RIGHT GREATER SEPHANOUS VEIN;  Surgeon: [name], MD;  Location: [facility];  Service: Open Heart Surgery;  Laterality: N/A;   ELECTROPHYSIOLOGIC STUDY N/A 05/12/2016   Procedure: Cardioversion;  Surgeon: Beverlie Kurihara [name], MD;  Location: [facility];  Service: Cardiovascular;  Laterality: N/A;   EP IMPLANTABLE DEVICE N/A 05/12/2016   Procedure: Pacemaker Implant;  Surgeon: Trino Higinbotham [name], MD;  Location: [facility];   Service: Cardiovascular;  Laterality: N/A;   EYE SURGERY     bilateral cataract with lens implant   HERNIA REPAIR  01/2019   left inguinal hernia repair   INGUINAL HERNIA REPAIR Left 02/21/2019   Procedure: LEFT INGUINAL HERNIA REPAIR WITH MESH;  Surgeon: [name], MD;  Location: [facility];  Service: General;  Laterality: Left;   INSERT / REPLACE / REMOVE PACEMAKER  05/12/2016   INTRAVASCULAR PRESSURE WIRE/FFR STUDY N/A 05/15/2019   Procedure: INTRAVASCULAR PRESSURE WIRE/FFR STUDY;  Surgeon: [name], MD;  Location: [facility];  Service: Cardiovascular;  Laterality: N/A;   KNEE ARTHROSCOPY Right 2003   LEFT HEART CATH AND CORONARY ANGIOGRAPHY N/A 05/15/2019   Procedure: LEFT HEART CATH AND CORONARY ANGIOGRAPHY;  Surgeon: [name], MD;  Location: [facility];  Service: Cardiovascular;  Laterality: N/A;   LEFT HEART CATHETERIZATION WITH CORONARY ANGIOGRAM N/A 03/31/2015   Procedure: LEFT HEART CATHETERIZATION WITH CORONARY ANGIOGRAM;  Surgeon: [name], MD;  Location: [facility];  Service: Cardiovascular;  Laterality: N/A;   LUNG SURGERY  2001   "open lung dissection"   MOHS SURGERY Left ~ 2008   ear   PROSTATE BIOPSY  2010   TEE WITHOUT CARDIOVERSION N/A 06/05/2019   Procedure: TRANSESOPHAGEAL ECHOCARDIOGRAM (TEE);  Surgeon: [name], [name], MD;  Location: [facility];  Service: Open Heart Surgery;  Laterality: N/A;   TONSILLECTOMY  1950   TOTAL KNEE ARTHROPLASTY Right 03/11/2020   Procedure: TOTAL KNEE ARTHROPLASTY;  Surgeon: [name], MD;  Location: [facility];  Service: Orthopedics;  Laterality: Right;  70 mins     Current Outpatient Medications  Medication Sig Dispense Refill   albuterol (VENTOLIN HFA) 108 (90 Base) MCG/ACT inhaler Inhale 2 puffs into the lungs every 6 (six) hours as needed for wheezing or shortness of breath. 8 g 2   amoxicillin (AMOXIL) 500 MG capsule TAKE 2 TABLETS BY MOUTH 1 HOUR PRIOR TO DENTAL APPOINTMENT AND 2 TABLETS IMMEDIATELY  FOLLOWING DENTAL APPOINTMENT     apixaban (ELIQUIS) 5 MG TABS tablet Take 1 tablet (5 mg total) by mouth 2 (two) times daily. 180 tablet 1   bimatoprost (LUMIGAN) 0.01 % SOLN Place 1 drop into both eyes at bedtime.     Cholecalciferol 25 MCG (1000 UT) tablet Take 1,000 Units by mouth daily.      diclofenac Sodium (VOLTAREN) 1 % GEL Apply 2 g topically 4 (four) times daily as needed.     dorzolamide-timolol (COSOPT) 22.3-6.8 MG/ML ophthalmic solution Place 1 drop into both eyes 2 (two) times daily.      dronedarone (MULTAQ) 400 MG tablet Take 1 tablet (400 mg total) by mouth 2 (two) times daily with a meal. 180 tablet 0   finasteride (PROSCAR) 5 MG tablet Take 5 mg by mouth daily after breakfast.      fluticasone (FLONASE) 50 MCG/ACT nasal spray Place 2 sprays into both nostrils 2 (two) times daily.     Fluticasone Propionate,  Inhal, (FLOVENT DISKUS) 100 MCG/ACT AEPB Inhale 1 puff into the lungs 2 (two) times daily as needed. 28 each 2   Multiple Vitamin (MULTIVITAMIN WITH MINERALS) TABS tablet Take 1 tablet by mouth daily.     pantoprazole (PROTONIX) 40 MG tablet Take 1 tablet by mouth once daily 90 tablet 3   Polyethyl Glycol-Propyl Glycol (SYSTANE OP) Place 1 drop into both eyes daily as needed (dry eyes).     rosuvastatin (CRESTOR) 20 MG tablet Take 1 tablet by mouth once daily 90 tablet 3   sacubitril-valsartan (ENTRESTO) 24-26 MG Take 1 tablet by mouth 2 (two) times daily. 180 tablet 3   tadalafil (CIALIS) 5 MG tablet Take 5 mg by mouth at bedtime.      triamterene-hydrochlorothiazide (MAXZIDE-25) 37.5-25 MG tablet Take 1 tablet by mouth once daily 90 tablet 3   No current facility-administered medications for this visit.    Allergies:   Nsaids   Social History:  The patient  reports that he has never smoked. He has never used smokeless tobacco. He reports current alcohol use. He reports that he does not use drugs.   Family History:  The patient's family history includes Arrhythmia (age  of onset: 79) in his brother; Arthritis in his mother; Cancer in his brother; Colon polyps in his mother; Congestive Heart Failure in his brother; Coronary artery disease in his brother and mother; Heart attack in his brother; Heart disease in his mother; Sleep apnea in his brother, father, and mother.   ROS:  Please see the history of present illness.   Otherwise, review of systems is positive for none.   All other systems are reviewed and negative.   PHYSICAL EXAM: VS:  BP 134/66   Pulse 65   Ht '5\' 10"'$  (1.778 m)   Wt 188 lb (85.3 kg)   SpO2 98%   BMI 26.98 kg/m  , BMI Body mass index is 26.98 kg/m. GEN: Well nourished, well developed, in no acute distress  HEENT: normal  Neck: no JVD, carotid bruits, or masses Cardiac: RRR; no murmurs, rubs, or gallops,no edema  Respiratory:  clear to auscultation bilaterally, normal work of breathing GI: soft, nontender, nondistended, + BS MS: no deformity or atrophy  Skin: warm and dry, device site well healed Neuro:  Strength and sensation are intact Psych: euthymic mood, full affect  EKG:  EKG is ordered today. Personal review of the ekg ordered shows AV paced  Personal review of the device interrogation today. Results in [facility]: 10/30/2021: ALT 15 05/12/2022: Hemoglobin 13.6; Platelets 191 07/02/2022: BUN 18; Creatinine, Ser 1.01; Potassium 3.9; Sodium 142    Lipid Panel     Component Value Date/Time   CHOL 173 10/30/2021 1557   CHOL 147 05/21/2020 0721   TRIG 74 10/30/2021 1557   HDL 58 10/30/2021 1557   HDL 66 05/21/2020 0721   CHOLHDL 3.0 10/30/2021 1557   VLDL 9.4 10/22/2020 0823   LDLCALC 99 10/30/2021 1557     Wt Readings from Last 3 Encounters:  09/21/22 188 lb (85.3 kg)  07/02/22 189 lb (85.7 kg)  05/31/22 192 lb 12.8 oz (87.5 kg)      Other studies Reviewed: Additional studies/ records that were reviewed today include: TTE 06/10/21 Review of the above records today demonstrates:   1. Left ventricular  ejection fraction, by estimation, is 60 to 65%. The  left ventricle has normal function. The left ventricle has no regional  wall motion abnormalities. Left ventricular diastolic parameters  are  consistent with Grade I diastolic  dysfunction (impaired relaxation). Elevated left ventricular end-diastolic  pressure. The E/e' is 30.   2. Right ventricular systolic function is normal. The right ventricular  size is normal. There is normal pulmonary artery systolic pressure. The  estimated right ventricular systolic pressure is 27.0 mmHg.   3. Left atrial size was mildly dilated.   4. The mitral valve is abnormal. Mild to moderate mitral valve  regurgitation.   5. The aortic valve is tricuspid. Aortic valve regurgitation is not  visualized. Mild to moderate aortic valve stenosis. Aortic valve area, by  VTI measures 1.82 cm. Aortic valve mean gradient measures 25.0 mmHg.  Aortic valve Vmax measures 3.18 m/s. Sharma Covert  is 0.40.   6. Aortic dilatation noted. There is mild dilatation of the ascending  aorta, measuring 42 mm.   7. The inferior vena cava is normal in size with greater than 50%  respiratory variability, suggesting right atrial pressure of 3 mmHg.   ASSESSMENT AND PLAN:  1.  Paroxysmal atrial fibrillation: Currently on Eliquis 5 mg twice daily, Multaq 400 mg twice daily.  High risk medication monitoring via ECG for Multaq.  CHA2DS2-VASc of 3.  He is on Multaq and his ejection fraction has gone down.  We Madge Therrien continue Multaq at the current dose and plan for biventricular upgrade.  If ejection fraction remains low, we Zailyn Thoennes need to find another antiarrhythmic medication.  2.  Hypertension: Currently well controlled  3.  Hyperlipidemia: Continue Crestor 40 mg daily  4.  Coronary artery disease: Status post CABG.  No current chest pain.  5.  Sick sinus syndrome/complete heart block: Status post Medtronic dual-chamber pacemaker implanted 05/12/2016.  Device functioning appropriately.  On 4 L  ejection fraction has gone down.  He is 100% ventricular paced.  Due to that, we Dylyn Mclaren plan for CRT-P upgrade.  Risk and benefits of been discussed risk of bleeding, tamponade, infection, pneumothorax, lead dislodgment.  He understands these risks and is agreed to the procedure.  6.  Nonsustained VT: Noted on device interrogation.  Short episodes asymptomatic.  No changes.  7.  Moderate aortic stenosis: No change on most recent echo.  8.  Secondary hypercoagulable state: Currently on Eliquis for atrial fibrillation as above   Current medicines are reviewed at length with the patient today.   The patient does not have concerns regarding his medicines.  The following changes were made today: none  Labs/ tests ordered today include:  Orders Placed This Encounter  Procedures   Basic metabolic panel   CBC   EKG 12-Lead      Disposition:   FU with Tyeasha Ebbs 3 months  Signed, Rebeca Valdivia [name], MD  09/21/2022 2:41 PM     [facility] [address] [place] [address] [phone] (office) [phone] (fax)

## 2022-09-22 ENCOUNTER — Encounter: Payer: Self-pay | Admitting: Cardiovascular Disease

## 2022-09-22 LAB — CUP PACEART INCLINIC DEVICE CHECK
Battery Remaining Longevity: 31 mo
Battery Voltage: 2.95 V
Brady Statistic AP VP Percent: 97.18 %
Brady Statistic AP VS Percent: 0.01 %
Brady Statistic AS VP Percent: 2.81 %
Brady Statistic AS VS Percent: 0 %
Brady Statistic RA Percent Paced: 98 %
Brady Statistic RV Percent Paced: 100 %
Date Time Interrogation Session: 20230926084612
Implantable Lead Implant Date: 20170517
Implantable Lead Implant Date: 20170517
Implantable Lead Location: 753859
Implantable Lead Location: 753860
Implantable Lead Model: 5076
Implantable Lead Model: 5076
Implantable Pulse Generator Implant Date: 20170517
Lead Channel Impedance Value: 380 Ohm
Lead Channel Impedance Value: 437 Ohm
Lead Channel Impedance Value: 494 Ohm
Lead Channel Impedance Value: 646 Ohm
Lead Channel Pacing Threshold Amplitude: 0.5 V
Lead Channel Pacing Threshold Amplitude: 0.5 V
Lead Channel Pacing Threshold Amplitude: 0.625 V
Lead Channel Pacing Threshold Amplitude: 0.75 V
Lead Channel Pacing Threshold Pulse Width: 0.4 ms
Lead Channel Pacing Threshold Pulse Width: 0.4 ms
Lead Channel Pacing Threshold Pulse Width: 0.4 ms
Lead Channel Pacing Threshold Pulse Width: 0.4 ms
Lead Channel Sensing Intrinsic Amplitude: 1.125 mV
Lead Channel Sensing Intrinsic Amplitude: 1.625 mV
Lead Channel Sensing Intrinsic Amplitude: 5.875 mV
Lead Channel Sensing Intrinsic Amplitude: 5.875 mV
Lead Channel Setting Pacing Amplitude: 2 V
Lead Channel Setting Pacing Amplitude: 2.5 V
Lead Channel Setting Pacing Pulse Width: 0.4 ms
Lead Channel Setting Sensing Sensitivity: 2.8 mV

## 2022-09-22 NOTE — Pre-Procedure Instructions (Signed)
Instructed patient on the following items: Arrival time 0830, time of procedure moved to 1030 Nothing to eat or drink after midnight No meds AM of procedure Responsible person to drive you home and stay with you for 24 hrs Wash with special soap night before and morning of procedure If on anti-coagulant drug instructions Eliquis- last dose 9/26

## 2022-09-23 ENCOUNTER — Other Ambulatory Visit: Payer: Self-pay

## 2022-09-23 ENCOUNTER — Ambulatory Visit (HOSPITAL_COMMUNITY): Payer: Medicare Other

## 2022-09-23 ENCOUNTER — Ambulatory Visit (HOSPITAL_COMMUNITY)
Admission: RE | Admit: 2022-09-23 | Discharge: 2022-09-23 | Disposition: A | Discharge status: Home / Self Care | Payer: Medicare Other | Attending: Cardiology | Admitting: Cardiology

## 2022-09-23 ENCOUNTER — Encounter (HOSPITAL_COMMUNITY)
Admission: RE | Disposition: A | Discharge status: Home / Self Care | Payer: Self-pay | Source: Home / Self Care | Attending: Cardiology

## 2022-09-23 DIAGNOSIS — I480 Paroxysmal atrial fibrillation: Secondary | ICD-10-CM | POA: Diagnosis not present

## 2022-09-23 DIAGNOSIS — I428 Other cardiomyopathies: Secondary | ICD-10-CM | POA: Insufficient documentation

## 2022-09-23 DIAGNOSIS — Z7901 Long term (current) use of anticoagulants: Secondary | ICD-10-CM | POA: Diagnosis not present

## 2022-09-23 DIAGNOSIS — D6869 Other thrombophilia: Secondary | ICD-10-CM | POA: Insufficient documentation

## 2022-09-23 DIAGNOSIS — E785 Hyperlipidemia, unspecified: Secondary | ICD-10-CM | POA: Diagnosis not present

## 2022-09-23 DIAGNOSIS — I080 Rheumatic disorders of both mitral and aortic valves: Secondary | ICD-10-CM | POA: Diagnosis not present

## 2022-09-23 DIAGNOSIS — I5022 Chronic systolic (congestive) heart failure: Secondary | ICD-10-CM | POA: Diagnosis not present

## 2022-09-23 DIAGNOSIS — I495 Sick sinus syndrome: Secondary | ICD-10-CM | POA: Insufficient documentation

## 2022-09-23 DIAGNOSIS — Z951 Presence of aortocoronary bypass graft: Secondary | ICD-10-CM | POA: Insufficient documentation

## 2022-09-23 DIAGNOSIS — I447 Left bundle-branch block, unspecified: Secondary | ICD-10-CM | POA: Insufficient documentation

## 2022-09-23 DIAGNOSIS — Z955 Presence of coronary angioplasty implant and graft: Secondary | ICD-10-CM | POA: Diagnosis not present

## 2022-09-23 DIAGNOSIS — I110 Hypertensive heart disease with heart failure: Secondary | ICD-10-CM | POA: Diagnosis not present

## 2022-09-23 DIAGNOSIS — Z950 Presence of cardiac pacemaker: Secondary | ICD-10-CM | POA: Diagnosis not present

## 2022-09-23 DIAGNOSIS — Z45010 Encounter for checking and testing of cardiac pacemaker pulse generator [battery]: Secondary | ICD-10-CM | POA: Insufficient documentation

## 2022-09-23 DIAGNOSIS — I2510 Atherosclerotic heart disease of native coronary artery without angina pectoris: Secondary | ICD-10-CM | POA: Diagnosis not present

## 2022-09-23 DIAGNOSIS — I472 Ventricular tachycardia, unspecified: Secondary | ICD-10-CM | POA: Insufficient documentation

## 2022-09-23 DIAGNOSIS — I5030 Unspecified diastolic (congestive) heart failure: Secondary | ICD-10-CM | POA: Diagnosis not present

## 2022-09-23 DIAGNOSIS — Z79899 Other long term (current) drug therapy: Secondary | ICD-10-CM | POA: Diagnosis not present

## 2022-09-23 HISTORY — PX: BIV UPGRADE: EP1202

## 2022-09-23 SURGERY — BIV UPGRADE

## 2022-09-23 MED ORDER — MIDAZOLAM HCL 5 MG/5ML IJ SOLN
INTRAMUSCULAR | Status: DC | PRN
  Administered 2022-09-23 (×4): 1 mg via INTRAVENOUS

## 2022-09-23 MED ORDER — IOHEXOL 350 MG/ML SOLN
INTRAVENOUS | Status: DC | PRN
  Administered 2022-09-23: 5 mL
  Administered 2022-09-23: 25 mL

## 2022-09-23 MED ORDER — HEPARIN (PORCINE) IN NACL 1000-0.9 UT/500ML-% IV SOLN
INTRAVENOUS | Status: AC
  Filled 2022-09-23: qty 500

## 2022-09-23 MED ORDER — MIDAZOLAM HCL 5 MG/5ML IJ SOLN
INTRAMUSCULAR | Status: AC
  Filled 2022-09-23: qty 5

## 2022-09-23 MED ORDER — SODIUM CHLORIDE 0.9 % IV SOLN: INTRAVENOUS | Status: DC

## 2022-09-23 MED ORDER — CEFAZOLIN SODIUM-DEXTROSE 1-4 GM/50ML-% IV SOLN
1.0000 g | Freq: Four times a day (QID) | INTRAVENOUS | Status: DC
  Administered 2022-09-23: 1 g via INTRAVENOUS
  Filled 2022-09-23: qty 50

## 2022-09-23 MED ORDER — FENTANYL CITRATE (PF) 100 MCG/2ML IJ SOLN
INTRAMUSCULAR | Status: DC | PRN
  Administered 2022-09-23 (×4): 25 ug via INTRAVENOUS

## 2022-09-23 MED ORDER — CHLORHEXIDINE GLUCONATE 4 % EX LIQD: 4.0000 | Freq: Once | CUTANEOUS | Status: DC

## 2022-09-23 MED ORDER — ACETAMINOPHEN 325 MG PO TABS: 325.0000 mg | ORAL_TABLET | ORAL | Status: DC | PRN

## 2022-09-23 MED ORDER — POVIDONE-IODINE 10 % EX SWAB
2.0000 | Freq: Once | CUTANEOUS | Status: AC
  Administered 2022-09-23: 2 via TOPICAL

## 2022-09-23 MED ORDER — ONDANSETRON HCL 4 MG/2ML IJ SOLN: 4.0000 mg | Freq: Four times a day (QID) | INTRAMUSCULAR | Status: DC | PRN

## 2022-09-23 MED ORDER — CEFAZOLIN SODIUM-DEXTROSE 2-4 GM/100ML-% IV SOLN
2.0000 g | INTRAVENOUS | Status: AC
  Administered 2022-09-23: 2 g via INTRAVENOUS

## 2022-09-23 MED ORDER — SODIUM CHLORIDE 0.9 % IV SOLN
INTRAVENOUS | Status: AC
  Filled 2022-09-23: qty 2

## 2022-09-23 MED ORDER — SODIUM CHLORIDE 0.9 % IV SOLN
80.0000 mg | INTRAVENOUS | Status: AC
  Administered 2022-09-23: 80 mg

## 2022-09-23 MED ORDER — LIDOCAINE HCL (PF) 1 % IJ SOLN
INTRAMUSCULAR | Status: DC | PRN
  Administered 2022-09-23: 60 mL

## 2022-09-23 MED ORDER — CEFAZOLIN SODIUM-DEXTROSE 2-4 GM/100ML-% IV SOLN
INTRAVENOUS | Status: AC
  Filled 2022-09-23: qty 100

## 2022-09-23 MED ORDER — FENTANYL CITRATE (PF) 100 MCG/2ML IJ SOLN
INTRAMUSCULAR | Status: AC
  Filled 2022-09-23: qty 2

## 2022-09-23 MED ORDER — HEPARIN (PORCINE) IN NACL 1000-0.9 UT/500ML-% IV SOLN
INTRAVENOUS | Status: DC | PRN
  Administered 2022-09-23: 500 mL

## 2022-09-23 SURGICAL SUPPLY — 19 items
BALLN ATTAIN 80 (BALLOONS) ×1
BALLOON ATTAIN 80 (BALLOONS) IMPLANT
CABLE SURGICAL S-101-97-12 (CABLE) ×1 IMPLANT
CATH ATTAIN COM SURV 6250V-MB2 (CATHETERS) IMPLANT
CATH ATTAIN SEL SURV 6248V-90 (CATHETERS) IMPLANT
CATH JOSEPH QUAD ALLRED 6F REP (CATHETERS) IMPLANT
DEVICE CRTP PERCEPTA QUAD MRI (Pacemaker) IMPLANT
DEVICE TORQUE .025-.038 (MISCELLANEOUS) IMPLANT
GUIDEWIRE ANGLED .035X150CM (WIRE) IMPLANT
KIT MICROPUNCTURE NIT STIFF (SHEATH) IMPLANT
LEAD ATTAIN PERFORM ST 4398-88 (Lead) IMPLANT
PAD DEFIB RADIO PHYSIO CONN (PAD) ×1 IMPLANT
POUCH AIGIS-R ANTIBACT PPM (Mesh General) ×1 IMPLANT
POUCH AIGIS-R ANTIBACT PPM MED (Mesh General) IMPLANT
SHEATH 9.5FR PRELUDE SNAP 13 (SHEATH) IMPLANT
SLITTER 6232ADJ (MISCELLANEOUS) IMPLANT
TRAY PACEMAKER INSERTION (PACKS) ×1 IMPLANT
WIRE ACUITY WHISPER EDS 4648 (WIRE) IMPLANT
WIRE HI TORQ VERSACORE-J 145CM (WIRE) IMPLANT

## 2022-09-23 NOTE — Discharge Instructions (Addendum)
After Your Lead Revision  Do not lift your arm above shoulder height for 1 week after your procedure. After 7 days, you may progress as below.  You should remove your sling 24 hours after your procedure, unless otherwise instructed by your provider.     Thursday September 30, 2022  Friday October 01, 2022 Saturday October 02, 2022 Sunday October 03, 2022   Do not lift, push, pull, or carry anything over 10 pounds with the affected arm until 6 weeks (Thursday November 04, 2022) after your procedure.   Do not drive until your wound check or until instructed by your healthcare provider that you are safe to do so.   Monitor your surgical site for redness, swelling, and drainage. Call the device clinic at [phone] if you experience these symptoms or fever/chills.  If your incision is sealed with Steri-strips or staples. You may shower 7 days after your procedure and wash your incision with soap and water as long as it is healed. If your incision is closed with Dermabond/Surgical glue. You may shower 1 day after your pacemaker implant and wash your incision with soap and water. Avoid lotions, ointments, or perfumes over your incision until it is well-healed.  You may use a hot tub or a pool AFTER your wound check appointment if the incision is completely closed.   Your cardiac device may be MRI compatible. We will discuss this at your office visit/Wound check  Remote monitoring is used to monitor your cardiac device from home. This monitoring is scheduled every 91 days by our office. It allows [place] to keep an eye on the functioning of your device to ensure it is working properly. You will routinely see your Electrophysiologist annually (more often if necessary).   Implantable Cardiac Device Lead Replacement, Care After This sheet gives you information about how to care for yourself after your procedure. Your health care provider may also give you more specific instructions. If you have problems or  questions, contact your health care provider. What can I expect after the procedure? After your procedure, it is common to have: Mild discomfort at the incision site. A small amount of drainage or bleeding at the incision site. This is usually no more than a spot. Follow these instructions at home: Incision care        Follow instructions from your health care provider about how to take care of your incision. Make sure you: Leave stitches (sutures), skin glue, or adhesive strips in place. These skin closures may need to stay in place for 2 weeks or longer. If adhesive strip edges start to loosen and curl up, you may trim the loose edges. Do not remove adhesive strips completely unless your health care provider tells you to do that. Check your incision area every day for signs of infection. Check for: More redness, swelling, or pain. More fluid or blood. Warmth. Pus or a bad smell. Electric and Optician, dispensing cell phones should be kept 12 inches (30 cm) away from the cardiac device when they are on. When talking on a cell phone, use the ear on the opposite side of your cardiac device. Do not place a cell phone in a pocket next to the cardiac device. Household appliances do not interfere with modern-day cardiac device. Medicines Take over-the-counter and prescription medicines only as told by your health care provider. General instructions Do not raise the arm on the side of your procedure higher than your shoulder for at least 7 days. Except  for this restriction, continue to use your arm as normal to prevent problems. Do not take baths, swim, or use a hot tub until your health care provider says it is okay to do so. You may shower as directed by your health care provider. Do not lift anything that is heavier than 10 lb (4.5 kg) for 6 weeks or the limit that your health care provider tells you, until he or she says that it is safe. Return to your normal activities after 2 weeks, or  as told by your health care provider. Ask your health care provider what activities are safe for you. Keep all follow-up visits as told by your health care provider. This is important. Contact a health care provider if: You have more redness, swelling, or pain around your incision. You have more fluid or blood coming from your incision. Your incision feels warm to the touch. You have pus or a bad smell coming from your incision. You have a fever. The arm or hand on the side of the cardiac device becomes swollen. The symptoms you had before your procedure are not getting better. Get help right away if: You develop chest pain. You feel like you will faint. You feel light-headed. You faint. Summary Check your incision area every day for signs of infection, such as more fluid or blood. A small amount of drainage or bleeding at the incision site is normal. Do not raise the arm on the side of your procedure higher than your shoulder for at least 5 days, or as long as directed by your health care provider. Digital cell phones should be kept 12 inches (30 cm) away from the cardiac device when they are on. When talking on a cell phone, use the ear on the opposite side of your cardiac device. If the symptoms that led to having your lead replaced are not getting better, contact your health care provider. This information is not intended to replace advice given to you by your health care provider. Make sure you discuss any questions you have with your health care provider.

## 2022-09-23 NOTE — Interval H&P Note (Signed)
History and Physical Interval Note:  09/23/2022 11:06 AM  Derrick [name]  has presented today for surgery, with the diagnosis of HEART FAILURE.  The various methods of treatment have been discussed with the patient and family. After consideration of risks, benefits and other options for treatment, the patient has consented to  Procedure(s): BIV PACEMAKER UPGRADE (N/A) as a surgical intervention.  The patient's history has been reviewed, patient examined, no change in status, stable for surgery.  I have reviewed the patient's chart and labs.  Questions were answered to the patient's satisfaction.     Ules Marsala [employer]

## 2022-09-23 NOTE — Progress Notes (Signed)
Spoke with Curt Bears, MD regarding results of patients chest x-ray. MD stated that patient was good to be discharged.

## 2022-09-24 ENCOUNTER — Encounter (HOSPITAL_COMMUNITY): Payer: Self-pay | Admitting: Cardiology

## 2022-09-24 ENCOUNTER — Telehealth: Payer: Self-pay

## 2022-09-24 NOTE — Telephone Encounter (Signed)
-----   Message from Mamie Levers, NP sent at 09/23/2022  4:07 PM EDT ----- BiV upgrade - Medtronic Going home same-day [facility]

## 2022-09-24 NOTE — Telephone Encounter (Addendum)
Follow-up after same day discharge: Implant date: 09/23/2022 MD: Allegra Lai, MD Device: MDT BIV PPM Location: left chest   Wound check visit: 10/08/2022 at 12:20 pm 90 day MD follow-up: scheduled  Remote Transmission received: yes  Dressing/sling removed: will remove today  Confirm [place] restart on: Sunday night September 26, 2022

## 2022-09-27 ENCOUNTER — Encounter: Payer: Self-pay | Admitting: Cardiology

## 2022-09-29 ENCOUNTER — Encounter (HOSPITAL_COMMUNITY): Payer: Self-pay | Admitting: Cardiology

## 2022-09-30 NOTE — Progress Notes (Signed)
Remote pacemaker transmission.   

## 2022-10-08 ENCOUNTER — Ambulatory Visit
Admission: RE | Admit: 2022-10-08 | Discharge: 2022-10-08 | Disposition: A | Discharge status: Home / Self Care | Payer: Medicare Other | Source: Ambulatory Visit | Attending: Student | Admitting: Student

## 2022-10-08 ENCOUNTER — Encounter: Payer: Self-pay | Admitting: Student

## 2022-10-08 ENCOUNTER — Ambulatory Visit: Payer: Medicare Other | Attending: Student | Admitting: Student

## 2022-10-08 VITALS — BP 114/62 | Ht 69.0 in | Wt 192.4 lb

## 2022-10-08 DIAGNOSIS — Z950 Presence of cardiac pacemaker: Secondary | ICD-10-CM

## 2022-10-08 DIAGNOSIS — Z95810 Presence of automatic (implantable) cardiac defibrillator: Secondary | ICD-10-CM | POA: Diagnosis not present

## 2022-10-08 DIAGNOSIS — I2510 Atherosclerotic heart disease of native coronary artery without angina pectoris: Secondary | ICD-10-CM | POA: Diagnosis not present

## 2022-10-08 LAB — CUP PACEART INCLINIC DEVICE CHECK
Battery Remaining Longevity: 128 mo
Battery Voltage: 3.21 V
Brady Statistic AP VP Percent: 97.92 %
Brady Statistic AP VS Percent: 0.02 %
Brady Statistic AS VP Percent: 2.06 %
Brady Statistic AS VS Percent: 0.01 %
Brady Statistic RA Percent Paced: 97.89 %
Brady Statistic RV Percent Paced: 99.97 %
Date Time Interrogation Session: 20231013120420
Implantable Lead Implant Date: 20170517
Implantable Lead Implant Date: 20170517
Implantable Lead Implant Date: 20230928
Implantable Lead Location: 753858
Implantable Lead Location: 753859
Implantable Lead Location: 753860
Implantable Lead Model: 4398
Implantable Lead Model: 5076
Implantable Lead Model: 5076
Implantable Pulse Generator Implant Date: 20230928
Lead Channel Impedance Value: 247 Ohm
Lead Channel Impedance Value: 285 Ohm
Lead Channel Impedance Value: 304 Ohm
Lead Channel Impedance Value: 342 Ohm
Lead Channel Impedance Value: 361 Ohm
Lead Channel Impedance Value: 380 Ohm
Lead Channel Impedance Value: 399 Ohm
Lead Channel Impedance Value: 418 Ohm
Lead Channel Impedance Value: 494 Ohm
Lead Channel Impedance Value: 513 Ohm
Lead Channel Impedance Value: 570 Ohm
Lead Channel Impedance Value: 589 Ohm
Lead Channel Impedance Value: 589 Ohm
Lead Channel Impedance Value: 608 Ohm
Lead Channel Pacing Threshold Amplitude: 0.625 V
Lead Channel Pacing Threshold Amplitude: 1 V
Lead Channel Pacing Threshold Amplitude: 1.625 V
Lead Channel Pacing Threshold Pulse Width: 0.4 ms
Lead Channel Pacing Threshold Pulse Width: 0.4 ms
Lead Channel Pacing Threshold Pulse Width: 0.4 ms
Lead Channel Sensing Intrinsic Amplitude: 1.125 mV
Lead Channel Sensing Intrinsic Amplitude: 1.75 mV
Lead Channel Setting Pacing Amplitude: 1.5 V
Lead Channel Setting Pacing Amplitude: 1.75 V
Lead Channel Setting Pacing Amplitude: 2 V
Lead Channel Setting Pacing Pulse Width: 0.4 ms
Lead Channel Setting Pacing Pulse Width: 0.4 ms
Lead Channel Setting Sensing Sensitivity: 0.9 mV

## 2022-10-08 NOTE — Patient Instructions (Signed)
Medication Instructions:  Your physician recommends that you continue on your current medications as directed. Please refer to the Current Medication list given to you today.  *If you need a refill on your cardiac medications before your next appointment, please call your pharmacy*   Lab Work: None If you have labs (blood work) drawn today and your tests are completely normal, you will receive your results only by: [place] (if you have MyChart) OR A paper copy in the mail If you have any lab test that is abnormal or we need to change your treatment, we will call you to review the results.   Follow-Up: At [facility], you and your health needs are our priority.  As part of our continuing mission to provide you with exceptional heart care, we have created designated Provider Care Teams.  These Care Teams include your primary Cardiologist (physician) and Advanced Practice Providers (APPs -  Physician Assistants and Nurse Practitioners) who all work together to provide you with the care you need, when you need it.   Your next appointment:   As scheduled  Other Instructions Chest X-ray Instructions:    1. You may have this done at the [facility], located in the [place] on the 1st floor.    2. You do no have to have an appointment.    3. [place], [place] [address]        [phone]        Monday - Friday  8:00 am - 5:00 pm

## 2022-10-08 NOTE — Progress Notes (Signed)
Electrophysiology Office Note Date: 10/08/2022  ID:  [name], Derrick June 21, 1941, MRN [id number]  PCP: [name], MD Primary Cardiologist: [name], MD Electrophysiologist: [name], MD   CC: Pacemaker follow-up  Derrick [name] is a 81 y.o. male seen today for Derrick [name], MD for post hospital follow up.    Admitted 9/28 for planned PPM. Uncomplicated course.    Since discharge from hospital the patient reports doing well. He initially had a good deal of diaphragmatic stim the first few days after device placed. Most often occurred with sitting, especially with left leaning. Has improved, but still occurs on occasion.  he denies chest pain, palpitations, dyspnea, PND, orthopnea, nausea, vomiting, dizziness, syncope, edema, weight gain, or early satiety.   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 s/p NSTEMI 06/01/11: DES to RCA;  b. cath 06/25/11:   dLM 50-60% (FFR 0.87), prox to mid LAD 40-50%, D1 50%, pCFX 50%, RCA stent ok, dPDA 80%, EF 55-60%.  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/2014   DISH (diffuse idiopathic skeletal hyperostosis) 12/02/2015   Diverticulitis    GENERALIZED OSTEOARTHROSIS UNSPECIFIED SITE 05/28/2010   GERD  05/28/2010   Heart murmur    History of gout    History of kidney stones    HYPERLIPIDEMIA 05/28/2010   Hypertension    IgM lambda paraproteinemia    Intrinsic asthma, 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   "associated w/lung sarcoidosis"   Malignant neoplasm of descending colon ([place]) 05/28/2010   MITRAL VALVE PROLAPSE 05/28/2010   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   NSTEMI (non-ST elevated myocardial infarction) ([place]) 06/03/11   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   OSA on CPAP 05/28/2010   PALPITATIONS, CHRONIC 05/28/2010   Parotid gland pain 2010   infection   Peripheral neuropathy    "tx'd w/targeted chemo" (05/12/2016)   Pneumonia 2-3 times   Presence of permanent cardiac pacemaker    Medtronic   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   Sarcoidosis    pulmonalis   UNSPECIFIED INFLAMMATORY AND TOXIC NEUROPATHY 05/28/2010   Vision loss    VITAMIN D DEFICIENCY 05/28/2010   Past Surgical History:  Procedure Laterality Date   APPENDECTOMY  1954   age 64   BIV UPGRADE N/A 09/23/2022   Procedure: BIV PACEMAKER UPGRADE;  Surgeon: [name], MD;  Location: [facility];  Service: Cardiovascular;  Laterality: N/A;   CATARACT EXTRACTION W/ INTRAOCULAR LENS IMPLANT Left    COLON SURGERY  2000   decending colon    CORONARY ANGIOPLASTY WITH STENT PLACEMENT  06/03/11   CORONARY ARTERY BYPASS GRAFT N/A 06/05/2019   Procedure: CORONARY ARTERY BYPASS GRAFTING (CABG) TIMES THREE USING LEFT INTERNAL MAMMARY ARTERY AND RIGHT GREATER  SEPHANOUS VEIN;  Surgeon: [name], MD;  Location: [facility];  Service: Open Heart Surgery;  Laterality: N/A;   ELECTROPHYSIOLOGIC STUDY N/A 05/12/2016   Procedure: Cardioversion;  Surgeon: Derrick [name], MD;  Location: [facility];  Service: Cardiovascular;  Laterality: N/A;   EP IMPLANTABLE DEVICE N/A 05/12/2016   Procedure: Pacemaker Implant;  Surgeon: Derrick [name], MD;  Location: [facility];  Service: Cardiovascular;   Laterality: N/A;   EYE SURGERY     bilateral cataract with lens implant   HERNIA REPAIR  01/2019   left inguinal hernia repair   INGUINAL HERNIA REPAIR Left 02/21/2019   Procedure: LEFT INGUINAL HERNIA REPAIR WITH MESH;  Surgeon: [name], MD;  Location: [facility];  Service: General;  Laterality: Left;   INSERT / REPLACE / REMOVE PACEMAKER  05/12/2016   INTRAVASCULAR PRESSURE WIRE/FFR STUDY N/A 05/15/2019   Procedure: INTRAVASCULAR PRESSURE WIRE/FFR STUDY;  Surgeon: [name], MD;  Location: [facility];  Service: Cardiovascular;  Laterality: N/A;   KNEE ARTHROSCOPY Right 2003   LEFT HEART CATH AND CORONARY ANGIOGRAPHY N/A 05/15/2019   Procedure: LEFT HEART CATH AND CORONARY ANGIOGRAPHY;  Surgeon: [name], MD;  Location: [facility];  Service: Cardiovascular;  Laterality: N/A;   LEFT HEART CATHETERIZATION WITH CORONARY ANGIOGRAM N/A 03/31/2015   Procedure: LEFT HEART CATHETERIZATION WITH CORONARY ANGIOGRAM;  Surgeon: [name], MD;  Location: [facility];  Service: Cardiovascular;  Laterality: N/A;   LUNG SURGERY  2001   "open lung dissection"   MOHS SURGERY Left ~ 2008   ear   PROSTATE BIOPSY  2010   TEE WITHOUT CARDIOVERSION N/A 06/05/2019   Procedure: TRANSESOPHAGEAL ECHOCARDIOGRAM (TEE);  Surgeon: [name], [name], MD;  Location: [facility];  Service: Open Heart Surgery;  Laterality: N/A;   TONSILLECTOMY  1950   TOTAL KNEE ARTHROPLASTY Right 03/11/2020   Procedure: TOTAL KNEE ARTHROPLASTY;  Surgeon: [name], MD;  Location: [facility];  Service: Orthopedics;  Laterality: Right;  70 mins    Current Outpatient Medications  Medication Sig Dispense Refill   albuterol (VENTOLIN HFA) 108 (90 Base) MCG/ACT inhaler Inhale 2 puffs into the lungs every 6 (six) hours as needed for wheezing or shortness of breath. 8 g 2   amoxicillin (AMOXIL) 500 MG capsule TAKE 2 TABLETS BY MOUTH 1 HOUR PRIOR TO DENTAL APPOINTMENT AND 2 TABLETS IMMEDIATELY FOLLOWING DENTAL APPOINTMENT      apixaban (ELIQUIS) 5 MG TABS tablet Take 1 tablet (5 mg total) by mouth 2 (two) times daily. 180 tablet 1   bimatoprost (LUMIGAN) 0.01 % SOLN Place 1 drop into both eyes at bedtime.     Cholecalciferol 25 MCG (1000 UT) tablet Take 1,000 Units by mouth daily.      diclofenac Sodium (VOLTAREN) 1 % GEL Apply 2 g topically 4 (four) times daily as needed (pain).     dorzolamide-timolol (COSOPT) 22.3-6.8 MG/ML ophthalmic solution Place 1 drop into both eyes 2 (two) times daily.      dronedarone (MULTAQ) 400 MG tablet Take 1 tablet (400 mg total) by mouth 2 (two) times daily with a meal. 180 tablet 0   finasteride (PROSCAR) 5 MG tablet Take 5 mg by mouth daily after breakfast.      fluticasone (FLONASE) 50 MCG/ACT nasal spray Place 2 sprays into both nostrils 2 (two) times daily.     Fluticasone Propionate, Inhal, (FLOVENT DISKUS) 100 MCG/ACT AEPB Inhale 1 puff into the lungs 2 (two) times daily  as needed. 28 each 2   Multiple Vitamin (MULTIVITAMIN WITH MINERALS) TABS tablet Take 1 tablet by mouth daily.     pantoprazole (PROTONIX) 40 MG tablet Take 1 tablet by mouth once daily 90 tablet 3   Polyethyl Glycol-Propyl Glycol (SYSTANE OP) Place 1 drop into both eyes daily as needed (dry eyes).     rosuvastatin (CRESTOR) 20 MG tablet Take 1 tablet by mouth once daily 90 tablet 3   sacubitril-valsartan (ENTRESTO) 24-26 MG Take 1 tablet by mouth 2 (two) times daily. 180 tablet 3   tadalafil (CIALIS) 5 MG tablet Take 5 mg by mouth at bedtime.      triamterene-hydrochlorothiazide (MAXZIDE-25) 37.5-25 MG tablet Take 1 tablet by mouth once daily 90 tablet 3   No current facility-administered medications for this visit.    Allergies:   Nsaids   Social History: Social History   Socioeconomic History   Marital status: Married    Spouse name: Not on file   Number of children: 2   Years of education: MS-Coll.   Highest education level: Not on file  Occupational History   Occupation: Consultant     Comment: Building services engineer  Tobacco Use   Smoking status: Never   Smokeless tobacco: Never  Vaping Use   Vaping Use: Never used  Substance and Sexual Activity   Alcohol use: Yes    Comment: occasional   Drug use: No   Sexual activity: Yes    Birth control/protection: None  Other Topics Concern   Not on file  Social History Narrative   College- [employer]; [employer]-environment science and mgt. Married [name])  "72" 1 son- 70; 1 dtr "73" 2 grandchildren. 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 End of life-provided discussive context and provided packet.   Social Determinants of Health   Financial Resource Strain: Medium Risk (01/05/2022)   Overall Financial Resource Strain (CARDIA)    Difficulty of Paying Living Expenses: Somewhat hard  Food Insecurity: No Food Insecurity (10/22/2021)   Hunger Vital Sign    Worried About Running Out of Food in the Last Year: Never true    Ran Out of Food in the Last Year: Never true  Transportation Needs: No Transportation Needs (10/22/2021)   PRAPARE - Hydrologist (Medical): No    Lack of Transportation (Non-Medical): No  Physical Activity: Sufficiently Active (10/22/2021)   Exercise Vital Sign    Days of Exercise per Week: 7 days    Minutes of Exercise per Session: 50 min  Stress: No Stress Concern Present (10/22/2021)   [place]    Feeling of Stress : Not at all  Social Connections: Moderately Integrated (10/22/2021)   Social Connection and Isolation Panel [NHANES]    Frequency of Communication with Friends and Family: More than three times a week    Frequency of Social Gatherings with Friends and Family: More than three times a week    Attends Religious Services: More than 4 times per year    Active Member of [employer] or Organizations: No    Attends Archivist Meetings: Never    Marital Status: Married  Human resources officer  Violence: Not At Risk (10/22/2021)   Humiliation, Afraid, Rape, and Kick questionnaire    Fear of Current or Ex-Partner: No    Emotionally Abused: No    Physically Abused: No    Sexually Abused: No    Family History: Family History  Problem Relation Age of  Onset   Arthritis Mother        severe   Coronary artery disease Mother    Colon polyps Mother    Heart disease Mother    Sleep apnea Mother    Sleep apnea Father    Coronary artery disease Brother    Cancer Brother        choriocarcinoma   Arrhythmia Brother 108       Afib/Tachycardia   Heart attack Brother    Congestive Heart Failure Brother    Sleep apnea Brother      Review of Systems: All other systems reviewed and are otherwise negative except as noted above.  Physical Exam: Vitals:   10/08/22 1028  BP: 114/62  SpO2: 97%  Weight: 192 lb 6.4 oz (87.3 kg)  Height: '5\' 9"'$  (1.753 m)     GEN- The patient is well appearing, alert and oriented x 3 today.   HEENT: normocephalic, atraumatic; sclera clear, conjunctiva pink; hearing intact; oropharynx clear; neck supple, no JVP Lymph- no cervical lymphadenopathy Lungs- Clear to ausculation bilaterally, normal work of breathing.  No wheezes, rales, rhonchi Heart- Regular  rate and rhythm, no murmurs, rubs or gallops, PMI not laterally displaced GI- soft, non-tender, non-distended, bowel sounds present, no hepatosplenomegaly Extremities- no clubbing or cyanosis. No peripheral edema; DP/PT/radial pulses 2+ bilaterally MS- no significant deformity or atrophy Skin- warm and dry, no rash or lesion; PPM pocket well healed Psych- euthymic mood, full affect Neuro- strength and sensation are intact  PPM Interrogation-  reviewed in detail today,  See PACEART report.  EKG:  EKG is ordered today. Personal review of ekg ordered today shows BiV pacing at 65 bpm with QRS 160 ms programmed LV3 to LV1   Recent Labs: 10/30/2021: ALT 15 09/21/2022: BUN 20; Creatinine, Ser 1.12;  Hemoglobin 12.7; Platelets 154; Potassium 4.3; Sodium 137   Wt Readings from Last 3 Encounters:  10/08/22 192 lb 6.4 oz (87.3 kg)  09/23/22 182 lb (82.6 kg)  09/21/22 188 lb (85.3 kg)     Other studies Reviewed: Additional studies/ records that were reviewed today include: Previous EP office notes, Previous remote checks, Most recent labwork.   Assessment and Plan:  1. CHB s/p Medtronic PPM  Normal PPM function See Claudia Desanctis Art report See changes below.  2. Diaphragmatic stim Reprogrammed to LV 2->LV1 but then stim recurred after disconnected from EKG.  Reprogrammed to LV1 - LV 4 with stim > 2.5 V. Changed amplitude to 1.75V @ 0.4 ms with 0.75V safety margin and cap confirm left on.  Pt was initially programmed to LV2>LV1 and taken off EKG, then stim recurred. He was reprogrammed to LV1-LV4 with next best battery life by [employer], but EKG was not captured of this vector.  CXR today to make sure lead has not significantly shifted.   Current medicines are reviewed at length with the patient today.    Labs/ tests ordered today include:  Orders Placed This Encounter  Procedures   DG Chest 2 View   EKG 12-Lead   Disposition:   Follow up with Dr. Curt Bears in 3 months   Signed, Shirley Friar, PA-C  10/08/2022 12:02 PM  [name] [address] [place] [address] [phone] (office) [phone] (fax)

## 2022-10-13 ENCOUNTER — Ambulatory Visit: Payer: Medicare Other

## 2022-10-20 ENCOUNTER — Other Ambulatory Visit: Payer: Self-pay | Admitting: Cardiology

## 2022-10-20 MED ORDER — MULTAQ 400 MG PO TABS: 400.0000 mg | ORAL_TABLET | Freq: Two times a day (BID) | ORAL | 3 refills | Status: DC

## 2022-10-26 ENCOUNTER — Ambulatory Visit (INDEPENDENT_AMBULATORY_CARE_PROVIDER_SITE_OTHER): Payer: Medicare Other

## 2022-10-26 VITALS — Ht 69.0 in | Wt 192.0 lb

## 2022-10-26 DIAGNOSIS — Z0000 Encounter for general adult medical examination without abnormal findings: Secondary | ICD-10-CM

## 2022-10-26 NOTE — Progress Notes (Signed)
Virtual Visit via Telephone Note  I connected with  Derrick [name] on 10/26/22 at 11:15 AM EDT by telephone and verified that I am speaking with the correct person using two identifiers.  Location: Patient: home Provider: [place] Persons participating in the virtual visit: [place]   I discussed the limitations, risks, security and privacy concerns of performing an evaluation and management service by telephone and the availability of in person appointments. The patient expressed understanding and agreed to proceed.  Interactive audio and video telecommunications were attempted between this nurse and patient, however failed, due to patient having technical difficulties OR patient did not have access to video capability.  We continued and completed visit with audio only.  Some vital signs may be absent or patient reported.   Dionisio David, LPN  Subjective:   Derrick [name] is a 81 y.o. male who presents for Medicare Annual/Subsequent preventive examination.  Review of Systems     Cardiac Risk Factors include: advanced age (>73mn, >>45women);hypertension;dyslipidemia;male gender     Objective:    There were no vitals filed for this visit. There is no height or weight on file to calculate BMI.     10/26/2022   11:24 AM 09/23/2022    9:26 AM 05/12/2022   11:32 AM 10/22/2021    9:08 AM 10/21/2020    9:28 AM 03/11/2020   10:43 AM 03/04/2020    1:45 PM  Advanced Directives  Does Patient Have a Medical Advance Directive? Yes Yes No Yes Yes Yes Yes  Type of AParamedicof A[place]intondaleLiving will H[place]villeLiving will  [place]ilverhillLiving will HGrafLiving will HLightstreetLiving will Living will;Healthcare Power of Attorney  Does patient want to make changes to medical advance directive? No - Patient declined   Yes (MAU/Ambulatory/Procedural Areas -  Information given)  No - Patient declined No - Patient declined  Copy of HBrookhavenin Chart? Yes - validated most recent copy scanned in chart (See row information)   Yes - validated most recent copy scanned in chart (See row information) Yes - validated most recent copy scanned in chart (See row information) No - copy requested No - copy requested    Current Medications (verified) Outpatient Encounter Medications as of 10/26/2022  Medication Sig   albuterol (VENTOLIN HFA) 108 (90 Base) MCG/ACT inhaler Inhale 2 puffs into the lungs every 6 (six) hours as needed for wheezing or shortness of breath.   apixaban (ELIQUIS) 5 MG TABS tablet Take 1 tablet (5 mg total) by mouth 2 (two) times daily.   bimatoprost (LUMIGAN) 0.01 % SOLN Place 1 drop into both eyes at bedtime.   Cholecalciferol 25 MCG (1000 UT) tablet Take 1,000 Units by mouth daily.    diclofenac Sodium (VOLTAREN) 1 % GEL Apply 2 g topically 4 (four) times daily as needed (pain).   dorzolamide-timolol (COSOPT) 22.3-6.8 MG/ML ophthalmic solution Place 1 drop into both eyes 2 (two) times daily.    dronedarone (MULTAQ) 400 MG tablet Take 1 tablet (400 mg total) by mouth 2 (two) times daily with a meal.   finasteride (PROSCAR) 5 MG tablet Take 5 mg by mouth daily after breakfast.    fluticasone (FLONASE) 50 MCG/ACT nasal spray Place 2 sprays into both nostrils 2 (two) times daily.   Fluticasone Propionate, Inhal, (FLOVENT DISKUS) 100 MCG/ACT AEPB Inhale 1 puff into the lungs 2 (two) times daily as needed.  Multiple Vitamin (MULTIVITAMIN WITH MINERALS) TABS tablet Take 1 tablet by mouth daily.   pantoprazole (PROTONIX) 40 MG tablet Take 1 tablet by mouth once daily   Polyethyl Glycol-Propyl Glycol (SYSTANE OP) Place 1 drop into both eyes daily as needed (dry eyes).   rosuvastatin (CRESTOR) 20 MG tablet Take 1 tablet by mouth once daily   sacubitril-valsartan (ENTRESTO) 24-26 MG Take 1 tablet by mouth 2 (two) times daily.    tadalafil (CIALIS) 5 MG tablet Take 5 mg by mouth at bedtime.    triamterene-hydrochlorothiazide (MAXZIDE-25) 37.5-25 MG tablet Take 1 tablet by mouth once daily   amoxicillin (AMOXIL) 500 MG capsule TAKE 2 TABLETS BY MOUTH 1 HOUR PRIOR TO DENTAL APPOINTMENT AND 2 TABLETS IMMEDIATELY FOLLOWING DENTAL APPOINTMENT (Patient not taking: Reported on 10/26/2022)   No facility-administered encounter medications on file as of 10/26/2022.    Allergies (verified) Nsaids   History: Past Medical History:  Diagnosis Date   Anemia    iron deficiency anemia- infusion on 02/15/2020 and 02/22/2020   BENIGN PROSTATIC HYPERTROPHY, WITH OBSTRUCTION 05/28/2010   CAD (coronary artery disease)    a. s/p NSTEMI 06/01/11: DES to RCA;  b. cath 06/25/11:   dLM 50-60% (FFR 0.87), prox to mid LAD 40-50%, D1 50%, pCFX 50%, RCA stent ok, dPDA 80%, EF 55-60%.  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   DISH (diffuse idiopathic skeletal hyperostosis) 12/02/2015   Diverticulitis    GENERALIZED OSTEOARTHROSIS UNSPECIFIED SITE 05/28/2010   GERD 05/28/2010   Heart murmur    History of gout    History of kidney stones    HYPERLIPIDEMIA 05/28/2010   Hypertension    IgM lambda paraproteinemia    Intrinsic asthma, 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   "associated w/lung sarcoidosis"   Malignant neoplasm of descending colon ([place]) 05/28/2010   MITRAL VALVE PROLAPSE 05/28/2010   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   NSTEMI (non-ST elevated myocardial  infarction) ([place]) 06/03/11   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   OSA on CPAP 05/28/2010   PALPITATIONS, CHRONIC 05/28/2010   Parotid gland pain 2010   infection   Peripheral neuropathy    "tx'd w/targeted chemo" (05/12/2016)   Pneumonia 2-3 times   Presence of permanent cardiac pacemaker    [employer]tr[employer]   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   Sarcoidosis    pulmonalis   UNSPECIFIED INFLAMMATORY AND TOXIC NEUROPATHY 05/28/2010   Vision loss    VITAMIN D DEFICIENCY 05/28/2010   Past Surgical History:  Procedure Laterality Date   APPENDECTOMY  1954   age 24   BIV UPGRADE N/A 09/23/2022   Procedure: BIV PACEMAKER UPGRADE;  Surgeon: [name], MD;  Location: [facility];  Service: Cardiovascular;  Laterality: N/A;   CATARACT EXTRACTION W/ INTRAOCULAR LENS IMPLANT Left    COLON SURGERY  2000   decending colon    CORONARY ANGIOPLASTY WITH STENT PLACEMENT  06/03/11   CORONARY ARTERY BYPASS GRAFT N/A 06/05/2019   Procedure: CORONARY ARTERY BYPASS GRAFTING (CABG) TIMES  THREE USING LEFT INTERNAL MAMMARY ARTERY AND RIGHT GREATER SEPHANOUS VEIN;  Surgeon: [name], MD;  Location: [facility];  Service: Open Heart Surgery;  Laterality: N/A;   ELECTROPHYSIOLOGIC STUDY N/A 05/12/2016   Procedure: Cardioversion;  Surgeon: [name], MD;  Location: [place] [facility];  Service: Cardiovascular;  Laterality: N/A;   EP IMPLANTABLE DEVICE N/A 05/12/2016   Procedure: Pacemaker Implant;  Surgeon: [name], MD;  Location: [place] [facility];  Service: Cardiovascular;  Laterality: N/A;   EYE SURGERY     bilateral cataract with lens implant   HERNIA REPAIR  01/2019   left inguinal hernia repair   INGUINAL HERNIA REPAIR Left 02/21/2019   Procedure: LEFT INGUINAL HERNIA REPAIR WITH MESH;  Surgeon: [name], MD;  Location: [facility];  Service: General;  Laterality: Left;   INSERT / REPLACE / REMOVE PACEMAKER  05/12/2016   INTRAVASCULAR PRESSURE WIRE/FFR STUDY N/A 05/15/2019    Procedure: INTRAVASCULAR PRESSURE WIRE/FFR STUDY;  Surgeon: [name], MD;  Location: [facility] [facility];  Service: Cardiovascular;  Laterality: N/A;   KNEE ARTHROSCOPY Right 2003   LEFT HEART CATH AND CORONARY ANGIOGRAPHY N/A 05/15/2019   Procedure: LEFT HEART CATH AND CORONARY ANGIOGRAPHY;  Surgeon: [name], MD;  Location: [place] [facility];  Service: Cardiovascular;  Laterality: N/A;   LEFT HEART CATHETERIZATION WITH CORONARY ANGIOGRAM N/A 03/31/2015   Procedure: LEFT HEART CATHETERIZATION WITH CORONARY ANGIOGRAM;  Surgeon: [name], MD;  [facility];  Service: Cardiovascular;  Laterality: N/A;   LUNG SURGERY  2001   "open lung dissection"   MOHS SURGERY Left ~ 2008   ear   PROSTATE BIOPSY  2010   TEE WITHOUT CARDIOVERSION N/A 06/05/2019   Procedure: TRANSESOPHAGEAL ECHOCARDIOGRAM (TEE);  Surgeon: [name], [name], MD;  Location: [facility];  Service: Open Heart Surgery;  Laterality: N/A;   TONSILLECTOMY  1950   TOTAL KNEE ARTHROPLASTY Right 03/11/2020   Procedure: TOTAL KNEE ARTHROPLASTY;  Surgeon: [name], MD;  Location: [facility];  Service: Orthopedics;  Laterality: Right;  70 mins   Family History  Problem Relation Age of Onset   Arthritis Mother        severe   Coronary artery disease Mother    Colon polyps Mother    Heart disease Mother    Sleep apnea Mother    Sleep apnea Father    Coronary artery disease Brother    Cancer Brother        choriocarcinoma   Arrhythmia Brother 39       Afib/Tachycardia   Heart attack Brother    Congestive Heart Failure Brother    Sleep apnea Brother    Social History   Socioeconomic History   Marital status: Married    Spouse name: Not on file   Number of children: 2   Years of education: MS-Coll.   Highest education level: Not on file  Occupational History   Occupation: Consultant    Comment: Building services engineer  Tobacco Use   Smoking status: Never   Smokeless tobacco: Never  Vaping Use   Vaping  Use: Never used  Substance and Sexual Activity   Alcohol use: Yes    Comment: occasional   Drug use: No   Sexual activity: Yes    Birth control/protection: None  Other Topics Concern   Not on file  Social History Narrative   College- [employer]; [employer]-environment science and mgt. Married [name])  "11" 1 son- 7; 1 dtr "110"  2 grandchildren. 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 End of life-provided discussive context and provided packet.   Social Determinants of Health   Financial Resource Strain: Low Risk  (10/26/2022)   Overall Financial Resource Strain (CARDIA)    Difficulty of Paying Living Expenses: Not very hard  Food Insecurity: No Food Insecurity (10/26/2022)   Hunger Vital Sign    Worried About Running Out of Food in the Last Year: Never true    Ran Out of Food in the Last Year: Never true  Transportation Needs: No Transportation Needs (10/26/2022)   PRAPARE - Hydrologist (Medical): No    Lack of Transportation (Non-Medical): No  Physical Activity: Sufficiently Active (10/26/2022)   Exercise Vital Sign    Days of Exercise per Week: 7 days    Minutes of Exercise per Session: 50 min  Stress: No Stress Concern Present (10/26/2022)   [place]    Feeling of Stress : Not at all  Social Connections: Moderately Integrated (10/26/2022)   Social Connection and Isolation Panel [NHANES]    Frequency of Communication with Friends and Family: More than three times a week    Frequency of Social Gatherings with Friends and Family: Once a week    Attends Religious Services: More than 4 times per year    Active Member of [employer] or Organizations: No    Attends Music therapist: Never    Marital Status: Married    Tobacco Counseling Counseling given: Not Answered   Clinical Intake:  Pre-visit preparation completed: Yes  Pain : No/denies pain      Nutritional Risks: None Diabetes: No  How often do you need to have someone help you when you read instructions, pamphlets, or other written materials from your doctor or pharmacy?: 1 - Never  Diabetic?no  Interpreter Needed?: No  Information entered by :: Kirke Shaggy, LPN   Activities of Daily Living    10/26/2022   11:25 AM 10/19/2022   10:49 AM  In your present state of health, do you have any difficulty performing the following activities:  Hearing? 0 0  Vision? 0 0  Difficulty concentrating or making decisions? 0 0  Walking or climbing stairs? 1 1  Dressing or bathing? 0 0  Doing errands, shopping? 0 0  Preparing Food and eating ? N N  Using the Toilet? N N  In the past six months, have you accidently leaked urine? N N  Do you have problems with loss of bowel control? N N  Managing your Medications? N N  Managing your Finances? N N  Housekeeping or managing your Housekeeping? N N    Patient Care Team: [name], MD as PCP - General (Family Medicine) [name], MD as PCP - Electrophysiology (Cardiology) [name], MD as PCP - Cardiology (Cardiology) Heath Lark, MD as Consulting Physician (Hematology and Oncology) Debbora Dus, [facility] as Pharmacist (Pharmacist)  Indicate any recent Medical Services you may have received from other than [facility] providers in the past year (date may be approximate).     Assessment:   This is a routine wellness examination for Derrick [name].  Hearing/Vision screen Hearing Screening - Comments:: No aids Vision Screening - Comments:: Wears glasses- Dr.Tanner  Dietary issues and exercise activities discussed: Current Exercise Habits: Home exercise routine, Type of exercise: walking, Time (Minutes): 50, Frequency (Times/Week): 7, Weekly Exercise (Minutes/Week): 350, Intensity: Mild   Goals Addressed  This Visit's Progress    DIET - EAT MORE FRUITS AND VEGETABLES         Depression Screen     10/26/2022   11:23 AM 10/22/2021    9:13 AM 10/21/2020    9:28 AM 06/27/2020    8:54 AM 09/26/2019   11:32 AM 08/22/2019    7:35 AM 07/16/2019    7:17 AM  PHQ 2/9 Scores  PHQ - 2 Score 0 0 0 0 0 0 0  PHQ- 9 Score 0  0  0      Fall Risk    10/26/2022   11:25 AM 10/19/2022   10:49 AM 10/22/2021    9:11 AM 10/21/2020    9:28 AM 11/05/2019   10:16 AM  Fall Risk   Falls in the past year? '1 1 1 '$ 0 0  Comment   tripped    Number falls in past yr: 1 1 0 0   Comment   trimming shrubs    Injury with Fall? 0 0 0 0   Comment   soreness    Risk for fall due to : History of fall(s)  Other (Comment) Medication side effect   Risk for fall due to: Comment   tripped    Follow up Falls prevention discussed;Falls evaluation completed  Falls prevention discussed Falls evaluation completed;Falls prevention discussed     FALL RISK PREVENTION PERTAINING TO THE HOME:  Any stairs in or around the home? Yes  If so, are there any without handrails? No  Home free of loose throw rugs in walkways, pet beds, electrical cords, etc? Yes  Adequate lighting in your home to reduce risk of falls? Yes   ASSISTIVE DEVICES UTILIZED TO PREVENT FALLS:  Life alert? No  Use of a cane, walker or w/c? Yes  Grab bars in the bathroom? Yes  Shower chair or bench in shower? Yes  Elevated toilet seat or a handicapped toilet? No    Cognitive Function:    10/21/2020    9:29 AM 09/26/2019   11:35 AM 06/13/2018   11:50 AM  MMSE - Mini Mental State Exam  Orientation to time '5 5 5  '$ Orientation to Place '5 5 5  '$ Registration '3 3 3  '$ Attention/ Calculation 5 5 0  Recall '3 2 1  '$ Recall-comments   unable to recall 2 of 3 words  Language- name 2 objects   0  Language- repeat '1 1 1  '$ Language- follow 3 step command   3  Language- read & follow direction   0  Write a sentence   0  Copy design   0  Total score   18      11/05/2019   11:04 AM 07/26/2018    2:42 PM 01/26/2018    1:40 PM  Montreal Cognitive Assessment    Visuospatial/ Executive (0/5) '4 2 4  '$ Naming (0/3) '3 3 3  '$ Attention: Read list of digits (0/2) '2 2 1  '$ Attention: Read list of letters (0/1) '1 1 1  '$ Attention: Serial 7 subtraction starting at 100 (0/3) '3 3 3  '$ Language: Repeat phrase (0/2) '2 2 1  '$ Language : Fluency (0/1) 1 1 0  Abstraction (0/2) '2 2 2  '$ Delayed Recall (0/5) '5 4 3  '$ Orientation (0/6) '6 5 6  '$ Total '29 25 24      '$ 10/26/2022   11:26 AM  6CIT Screen  What Year? 0 points  What month? 0 points  What time? 0 points  Count back  from 20 0 points  Months in reverse 0 points  Repeat phrase 0 points  Total Score 0 points    Immunizations Immunization History  Administered Date(s) Administered   Fluad Quad(high Dose 65+) 08/14/2019, 09/22/2020   Influenza Split 09/26/2012   Influenza, High Dose Seasonal PF 09/02/2015, 08/24/2016, 09/04/2018, 09/07/2021, 09/13/2022   Influenza,inj,Quad PF,6+ Mos 09/25/2013, 09/12/2014   Influenza-Unspecified 09/15/2017, 09/05/2018   PFIZER(Purple Top)SARS-COV-2 Vaccination 01/11/2020, 02/01/2020, 08/29/2020, 04/17/2021   PNEUMOCOCCAL CONJUGATE-20 09/07/2021   Pfizer Covid-19 Vaccine Bivalent Booster 62yr & up 09/07/2021, 09/20/2022   Pneumococcal Conjugate-13 12/13/2013   Pneumococcal Polysaccharide-23 02/09/2016   Respiratory Syncytial Virus Vaccine,Recomb Aduvanted(Arexvy) 09/13/2022   Tdap 01/13/2016   Zoster Recombinat (Shingrix) 12/21/2017, 02/24/2018    TDAP status: Up to date  Flu Vaccine status: Up to date  Pneumococcal vaccine status: Up to date  Covid-19 vaccine status: Completed vaccines  Qualifies for Shingles Vaccine? Yes   Zostavax completed No   Shingrix Completed?: Yes  Screening Tests Health Maintenance  Topic Date Due   COVID-19 Vaccine (7 - Pfizer risk series) 11/15/2022   Medicare Annual Wellness (AWV)  10/27/2023   COLONOSCOPY (Pts 45-450yrInsurance coverage will need to be confirmed)  11/29/2023   TETANUS/TDAP  01/12/2026   Pneumonia Vaccine 79 Years old  Completed   INFLUENZA VACCINE  Completed   Hepatitis C Screening  Completed   Zoster Vaccines- Shingrix  Completed   HPV VACCINES  Aged Out    Health Maintenance  There are no preventive care reminders to display for this patient.   Colorectal cancer screening: Type of screening: Colonoscopy. Completed 11/28/20. Repeat every 3 years  Lung Cancer Screening: (Low Dose CT Chest recommended if Age 79-80ears, 30 pack-year currently smoking OR have quit w/in 15years.) does not qualify.   Additional Screening:  Hepatitis C Screening: does qualify; Completed 10/28/20  Vision Screening: Recommended annual ophthalmology exams for early detection of glaucoma and other disorders of the eye. Is the patient up to date with their annual eye exam?  Yes  Who is the provider or what is the name of the office in which the patient attends annual eye exams? Dr.Tanner If pt is not established with a provider, would they like to be referred to a provider to establish care? No .   Dental Screening: Recommended annual dental exams for proper oral hygiene  Community Resource Referral / Chronic Care Management: CRR required this visit?  No   CCM required this visit?  No      Plan:     I have personally reviewed and noted the following in the patient's chart:   Medical and social history Use of alcohol, tobacco or illicit drugs  Current medications and supplements including opioid prescriptions. Patient is not currently taking opioid prescriptions. Functional ability and status Nutritional status Physical activity Advanced directives List of other physicians Hospitalizations, surgeries, and ER visits in previous 12 months Vitals Screenings to include cognitive, depression, and falls Referrals and appointments  In addition, I have reviewed and discussed with patient certain preventive protocols, quality metrics, and best practice recommendations. A written personalized care plan for  preventive services as well as general preventive health recommendations were provided to patient.     LoDionisio DavidLPN   [id number] Nurse Notes: none

## 2022-10-26 NOTE — Patient Instructions (Signed)
[name] , Thank you for taking time to come for your Medicare Wellness Visit. I appreciate your ongoing commitment to your health goals. Please review the following plan we discussed and let me know if I can assist you in the future.   Screening recommendations/referrals: Colonoscopy: 11/28/20 Recommended yearly ophthalmology/optometry visit for glaucoma screening and checkup Recommended yearly dental visit for hygiene and checkup  Vaccinations: Influenza vaccine: 09/07/21 Pneumococcal vaccine: 09/07/21 Tdap vaccine: 01/13/16 Shingles vaccine: Shingrix 12/21/17, 02/24/18   Covid-19: 01/11/20, 02/01/20, 08/29/20, 04/17/21, 09/07/21, 09/20/22  Advanced directives: yes  Conditions/risks identified: none  Next appointment: Follow up in one year for your annual wellness visit. 10/28/23 @ 9:30 am by phone  Preventive Care 65 Years and Older, Male Preventive care refers to lifestyle choices and visits with your health care provider that can promote health and wellness. What does preventive care include? A yearly physical exam. This is also called an annual well check. Dental exams once or twice a year. Routine eye exams. Ask your health care provider how often you should have your eyes checked. Personal lifestyle choices, including: Daily care of your teeth and gums. Regular physical activity. Eating a healthy diet. Avoiding tobacco and drug use. Limiting alcohol use. Practicing safe sex. Taking low doses of aspirin every day. Taking vitamin and mineral supplements as recommended by your health care provider. What happens during an annual well check? The services and screenings done by your health care provider during your annual well check will depend on your age, overall health, lifestyle risk factors, and family history of disease. Counseling  Your health care provider may ask you questions about your: Alcohol use. Tobacco use. Drug use. Emotional well-being. Home and relationship  well-being. Sexual activity. Eating habits. History of falls. Memory and ability to understand (cognition). Work and work Statistician. Screening  You may have the following tests or measurements: Height, weight, and BMI. Blood pressure. Lipid and cholesterol levels. These may be checked every 5 years, or more frequently if you are over 79 years old. Skin check. Lung cancer screening. You may have this screening every year starting at age 79 if you have a 30-pack-year history of smoking and currently smoke or have quit within the past 15 years. Fecal occult blood test (FOBT) of the stool. You may have this test every year starting at age 79. Flexible sigmoidoscopy or colonoscopy. You may have a sigmoidoscopy every 5 years or a colonoscopy every 10 years starting at age 79. Prostate cancer screening. Recommendations will vary depending on your family history and other risks. Hepatitis C blood test. Hepatitis B blood test. Sexually transmitted disease (STD) testing. Diabetes screening. This is done by checking your blood sugar (glucose) after you have not eaten for a while (fasting). You may have this done every 1-3 years. Abdominal aortic aneurysm (AAA) screening. You may need this if you are a current or former smoker. Osteoporosis. You may be screened starting at age 79 if you are at high risk. Talk with your health care provider about your test results, treatment options, and if necessary, the need for more tests. Vaccines  Your health care provider may recommend certain vaccines, such as: Influenza vaccine. This is recommended every year. Tetanus, diphtheria, and acellular pertussis (Tdap, Td) vaccine. You may need a Td booster every 10 years. Zoster vaccine. You may need this after age 26. Pneumococcal 13-valent conjugate (PCV13) vaccine. One dose is recommended after age 22. Pneumococcal polysaccharide (PPSV23) vaccine. One dose is recommended after age 50.  Talk to your health care  provider about which screenings and vaccines you need and how often you need them. This information is not intended to replace advice given to you by your health care provider. Make sure you discuss any questions you have with your health care provider. Document Released: 01/09/2016 Document Revised: 09/01/2016 Document Reviewed: 10/14/2015 Elsevier Interactive Patient Education  2017 [place] Prevention in the Home Falls can cause injuries. They can happen to people of all ages. There are many things you can do to make your home safe and to help prevent falls. What can I do on the outside of my home? Regularly fix the edges of walkways and driveways and fix any cracks. Remove anything that might make you trip as you walk through a door, such as a raised step or threshold. Trim any bushes or trees on the path to your home. Use bright outdoor lighting. Clear any walking paths of anything that might make someone trip, such as rocks or tools. Regularly check to see if handrails are loose or broken. Make sure that both sides of any steps have handrails. Any raised decks and porches should have guardrails on the edges. Have any leaves, snow, or ice cleared regularly. Use sand or salt on walking paths during winter. Clean up any spills in your garage right away. This includes oil or grease spills. What can I do in the bathroom? Use night lights. Install grab bars by the toilet and in the tub and shower. Do not use towel bars as grab bars. Use non-skid mats or decals in the tub or shower. If you need to sit down in the shower, use a plastic, non-slip stool. Keep the floor dry. Clean up any water that spills on the floor as soon as it happens. Remove soap buildup in the tub or shower regularly. Attach bath mats securely with double-sided non-slip rug tape. Do not have throw rugs and other things on the floor that can make you trip. What can I do in the bedroom? Use night lights. Make  sure that you have a light by your bed that is easy to reach. Do not use any sheets or blankets that are too big for your bed. They should not hang down onto the floor. Have a firm chair that has side arms. You can use this for support while you get dressed. Do not have throw rugs and other things on the floor that can make you trip. What can I do in the kitchen? Clean up any spills right away. Avoid walking on wet floors. Keep items that you use a lot in easy-to-reach places. If you need to reach something above you, use a strong step stool that has a grab bar. Keep electrical cords out of the way. Do not use floor polish or wax that makes floors slippery. If you must use wax, use non-skid floor wax. Do not have throw rugs and other things on the floor that can make you trip. What can I do with my stairs? Do not leave any items on the stairs. Make sure that there are handrails on both sides of the stairs and use them. Fix handrails that are broken or loose. Make sure that handrails are as long as the stairways. Check any carpeting to make sure that it is firmly attached to the stairs. Fix any carpet that is loose or worn. Avoid having throw rugs at the top or bottom of the stairs. If you do have throw  rugs, attach them to the floor with carpet tape. Make sure that you have a light switch at the top of the stairs and the bottom of the stairs. If you do not have them, ask someone to add them for you. What else can I do to help prevent falls? Wear shoes that: Do not have high heels. Have rubber bottoms. Are comfortable and fit you well. Are closed at the toe. Do not wear sandals. If you use a stepladder: Make sure that it is fully opened. Do not climb a closed stepladder. Make sure that both sides of the stepladder are locked into place. Ask someone to hold it for you, if possible. Clearly mark and make sure that you can see: Any grab bars or handrails. First and last steps. Where the  edge of each step is. Use tools that help you move around (mobility aids) if they are needed. These include: Canes. Walkers. Scooters. Crutches. Turn on the lights when you go into a dark area. Replace any light bulbs as soon as they burn out. Set up your furniture so you have a clear path. Avoid moving your furniture around. If any of your floors are uneven, fix them. If there are any pets around you, be aware of where they are. Review your medicines with your doctor. Some medicines can make you feel dizzy. This can increase your chance of falling. Ask your doctor what other things that you can do to help prevent falls. This information is not intended to replace advice given to you by your health care provider. Make sure you discuss any questions you have with your health care provider. Document Released: 10/09/2009 Document Revised: 05/20/2016 Document Reviewed: 01/17/2015 Elsevier Interactive Patient Education  2017 [employer].

## 2022-10-28 ENCOUNTER — Ambulatory Visit: Payer: Medicare Other

## 2022-10-29 ENCOUNTER — Ambulatory Visit: Payer: Medicare Other

## 2022-10-29 VITALS — Ht 69.0 in | Wt 185.0 lb

## 2022-10-29 DIAGNOSIS — Z0000 Encounter for general adult medical examination without abnormal findings: Secondary | ICD-10-CM

## 2022-10-29 NOTE — Progress Notes (Signed)
Subjective:   Derrick [name] is a 81 y.o. male who presents for Medicare Annual/Subsequent preventive examination.  Review of Systems    Virtual Visit via Telephone Note  I connected with  Derrick [name] on 10/29/22 at 12:30 PM EDT by telephone and verified that I am speaking with the correct person using two identifiers.  Location: Patient: Home Provider: Office Persons participating in the virtual visit: patient/Nurse Health Advisor   I discussed the limitations, risks, security and privacy concerns of performing an evaluation and management service by telephone and the availability of in person appointments. The patient expressed understanding and agreed to proceed.  Interactive audio and video telecommunications were attempted between this nurse and patient, however failed, due to patient having technical difficulties OR patient did not have access to video capability.  We continued and completed visit with audio only.  Some vital signs may be absent or patient reported.   Criselda Peaches, LPN  Cardiac Risk Factors include: advanced age (>70mn, >>81women);hypertension;male gender     Objective:    Today's Vitals   10/29/22 1236  Weight: 185 lb (83.9 kg)  Height: '5\' 9"'$  (1.753 m)   Body mass index is 27.32 kg/m.     10/29/2022   12:45 PM 10/26/2022   11:24 AM 09/23/2022    9:26 AM 05/12/2022   11:32 AM 10/22/2021    9:08 AM 10/21/2020    9:28 AM 03/11/2020   10:43 AM  Advanced Directives  Does Patient Have a Medical Advance Directive? Yes Yes Yes No Yes Yes Yes  Type of AParamedicof [place]equ[place]Living will HCoyote FlatsLiving will HC[place]anLiving will  HPrairie du SacLiving will HParkerLiving will HBloomvilleLiving will  Does patient want to make changes to medical advance directive? No - Patient declined No - Patient declined   Yes  (MAU/Ambulatory/Procedural Areas - Information given)  No - Patient declined  Copy of HHew[place] Bay Parkin Chart? Yes - validated most recent copy scanned in chart (See row information) Yes - validated most recent copy scanned in chart (See row information)   Yes - validated most recent copy scanned in chart (See row information) Yes - validated most recent copy scanned in chart (See row information) No - copy requested    Current Medications (verified) Outpatient Encounter Medications as of 10/29/2022  Medication Sig   albuterol (VENTOLIN HFA) 108 (90 Base) MCG/ACT inhaler Inhale 2 puffs into the lungs every 6 (six) hours as needed for wheezing or shortness of breath.   amoxicillin (AMOXIL) 500 MG capsule TAKE 2 TABLETS BY MOUTH 1 HOUR PRIOR TO DENTAL APPOINTMENT AND 2 TABLETS IMMEDIATELY FOLLOWING DENTAL APPOINTMENT (Patient not taking: Reported on 10/26/2022)   apixaban (ELIQUIS) 5 MG TABS tablet Take 1 tablet (5 mg total) by mouth 2 (two) times daily.   bimatoprost (LUMIGAN) 0.01 % SOLN Place 1 drop into both eyes at bedtime.   Cholecalciferol 25 MCG (1000 UT) tablet Take 1,000 Units by mouth daily.    diclofenac Sodium (VOLTAREN) 1 % GEL Apply 2 g topically 4 (four) times daily as needed (pain).   dorzolamide-timolol (COSOPT) 22.3-6.8 MG/ML ophthalmic solution Place 1 drop into both eyes 2 (two) times daily.    dronedarone (MULTAQ) 400 MG tablet Take 1 tablet (400 mg total) by mouth 2 (two) times daily with a meal.   finasteride (PROSCAR) 5 MG tablet Take 5 mg by mouth daily after  breakfast.    fluticasone (FLONASE) 50 MCG/ACT nasal spray Place 2 sprays into both nostrils 2 (two) times daily.   Fluticasone Propionate, Inhal, (FLOVENT DISKUS) 100 MCG/ACT AEPB Inhale 1 puff into the lungs 2 (two) times daily as needed.   Multiple Vitamin (MULTIVITAMIN WITH MINERALS) TABS tablet Take 1 tablet by mouth daily.   pantoprazole (PROTONIX) 40 MG tablet Take 1 tablet by mouth once daily    Polyethyl Glycol-Propyl Glycol (SYSTANE OP) Place 1 drop into both eyes daily as needed (dry eyes).   rosuvastatin (CRESTOR) 20 MG tablet Take 1 tablet by mouth once daily   sacubitril-valsartan (ENTRESTO) 24-26 MG Take 1 tablet by mouth 2 (two) times daily.   tadalafil (CIALIS) 5 MG tablet Take 5 mg by mouth at bedtime.    triamterene-hydrochlorothiazide (MAXZIDE-25) 37.5-25 MG tablet Take 1 tablet by mouth once daily   No facility-administered encounter medications on file as of 10/29/2022.    Allergies (verified) Nsaids   History: Past Medical History:  Diagnosis Date   Anemia    iron deficiency anemia- infusion on 02/15/2020 and 02/22/2020   BENIGN PROSTATIC HYPERTROPHY, WITH OBSTRUCTION 05/28/2010   CAD (coronary artery disease)    a. s/p NSTEMI 06/01/11: DES to RCA;  b. cath 06/25/11:   dLM 50-60% (FFR 0.87), prox to mid LAD 40-50%, D1 50%, pCFX 50%, RCA stent ok, dPDA 80%, EF 55-60%.  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   DISH (diffuse idiopathic skeletal hyperostosis) 12/02/2015   Diverticulitis    GENERALIZED OSTEOARTHROSIS UNSPECIFIED SITE 05/28/2010   GERD 05/28/2010   Heart murmur    History of gout    History of kidney stones    HYPERLIPIDEMIA 05/28/2010   Hypertension    IgM lambda paraproteinemia    Intrinsic asthma, 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   "associated w/lung sarcoidosis"   Malignant neoplasm of descending colon ([place]) 05/28/2010   MITRAL VALVE PROLAPSE 05/28/2010   Monoclonal gammopathy of  undetermined significance 12/11/2011   NSTEMI (non-ST elevated myocardial infarction) ([place]) 06/03/11   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   OSA on CPAP 05/28/2010   PALPITATIONS, CHRONIC 05/28/2010   Parotid gland pain 2010   infection   Peripheral neuropathy    "tx'd w/targeted chemo" (05/12/2016)   Pneumonia 2-3 times   Presence of permanent cardiac pacemaker    [employer]tronic   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   Sarcoidosis    pulmonalis   UNSPECIFIED INFLAMMATORY AND TOXIC NEUROPATHY 05/28/2010   Vision loss    VITAMIN D DEFICIENCY 05/28/2010   Past Surgical History:  Procedure Laterality Date   APPENDECTOMY  1954   age 51   BIV UPGRADE N/A 09/23/2022   Procedure: BIV PACEMAKER UPGRADE;  Surgeon: [name], MD;  Location: [facility];  Service: Cardiovascular;  Laterality: N/A;   CATARACT EXTRACTION W/ INTRAOCULAR LENS IMPLANT Left    COLON SURGERY  2000   decending colon    CORONARY ANGIOPLASTY WITH STENT PLACEMENT  06/03/11   CORONARY ARTERY  BYPASS GRAFT N/A 06/05/2019   Procedure: CORONARY ARTERY BYPASS GRAFTING (CABG) TIMES THREE USING LEFT INTERNAL MAMMARY ARTERY AND RIGHT GREATER SEPHANOUS VEIN;  Surgeon: [name], MD;  Location: [facility];  Service: Open Heart Surgery;  Laterality: N/A;   ELECTROPHYSIOLOGIC STUDY N/A 05/12/2016   Procedure: Cardioversion;  Surgeon: [name], MD;  Location: [facility];  Service: Cardiovascular;  Laterality: N/A;   EP IMPLANTABLE DEVICE N/A 05/12/2016   Procedure: Pacemaker Implant;  Surgeon: [name], MD;  Location: [facility];  Service: Cardiovascular;  Laterality: N/A;   EYE SURGERY     bilateral cataract with lens implant   HERNIA REPAIR  01/2019   left inguinal hernia repair   INGUINAL HERNIA REPAIR Left 02/21/2019   Procedure: LEFT INGUINAL HERNIA REPAIR WITH MESH;  Surgeon: [name], MD;  Location: [facility];  Service: General;  Laterality: Left;   INSERT / REPLACE / REMOVE PACEMAKER   05/12/2016   INTRAVASCULAR PRESSURE WIRE/FFR STUDY N/A 05/15/2019   Procedure: INTRAVASCULAR PRESSURE WIRE/FFR STUDY;  Surgeon: [name], MD;  Location: [facility];  Service: Cardiovascular;  Laterality: N/A;   KNEE ARTHROSCOPY Right 2003   LEFT HEART CATH AND CORONARY ANGIOGRAPHY N/A 05/15/2019   Procedure: LEFT HEART CATH AND CORONARY ANGIOGRAPHY;  Surgeon: [name], MD;  Location: [facility];  Service: Cardiovascular;  Laterality: N/A;   LEFT HEART CATHETERIZATION WITH CORONARY ANGIOGRAM N/A 03/31/2015   Procedure: LEFT HEART CATHETERIZATION WITH CORONARY ANGIOGRAM;  Surgeon: [name], MD;  Location: [facility];  Service: Cardiovascular;  Laterality: N/A;   LUNG SURGERY  2001   "open lung dissection"   MOHS SURGERY Left ~ 2008   ear   PROSTATE BIOPSY  2010   TEE WITHOUT CARDIOVERSION N/A 06/05/2019   Procedure: TRANSESOPHAGEAL ECHOCARDIOGRAM (TEE);  Surgeon: [name], [name], MD;  Location: [place];  Service: Open Heart Surgery;  Laterality: N/A;   TONSILLECTOMY  1950   TOTAL KNEE ARTHROPLASTY Right 03/11/2020   Procedure: TOTAL KNEE ARTHROPLASTY;  Surgeon: [name], MD;  Location: [facility];  Service: Orthopedics;  Laterality: Right;  70 mins   Family History  Problem Relation Age of Onset   Arthritis Mother        severe   Coronary artery disease Mother    Colon polyps Mother    Heart disease Mother    Sleep apnea Mother    Sleep apnea Father    Coronary artery disease Brother    Cancer Brother        choriocarcinoma   Arrhythmia Brother 64       Afib/Tachycardia   Heart attack Brother    Congestive Heart Failure Brother    Sleep apnea Brother    Social History   Socioeconomic History   Marital status: Married    Spouse name: Not on file   Number of children: 2   Years of education: MS-Coll.   Highest education level: Not on file  Occupational History   Occupation: Consultant    Comment: Building services engineer  Tobacco Use   Smoking  status: Never   Smokeless tobacco: Never  Vaping Use   Vaping Use: Never used  Substance and Sexual Activity   Alcohol use: Yes    Comment: occasional   Drug use: No   Sexual activity: Yes    Birth control/protection: None  Other Topics Concern   Not on file  Social History Narrative   College- [employer]; [employer]-environment  science and mgt. Married [name])  "31" 1 son- 9; 1 dtr "27" 2 grandchildren. 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 End of life-provided discussive context and provided packet.   Social Determinants of Health   Financial Resource Strain: Low Risk  (10/29/2022)   Overall Financial Resource Strain (CARDIA)    Difficulty of Paying Living Expenses: Not hard at all  Food Insecurity: No Food Insecurity (10/29/2022)   Hunger Vital Sign    Worried About Running Out of Food in the Last Year: Never true    Ran Out of Food in the Last Year: Never true  Transportation Needs: No Transportation Needs (10/29/2022)   PRAPARE - Hydrologist (Medical): No    Lack of Transportation (Non-Medical): No  Physical Activity: Sufficiently Active (10/29/2022)   Exercise Vital Sign    Days of Exercise per Week: 7 days    Minutes of Exercise per Session: 50 min  Stress: No Stress Concern Present (10/29/2022)   [place]    Feeling of Stress : Not at all  Social Connections: [place] (10/29/2022)   Social Connection and Isolation Panel [NHANES]    Frequency of Communication with Friends and Family: More than three times a week    Frequency of Social Gatherings with Friends and Family: More than three times a week    Attends Religious Services: More than 4 times per year    Active Member of [employer] or Organizations: Yes    Attends Music therapist: More than 4 times per year    Marital Status: Married    Tobacco Counseling Counseling given: Not  Answered   Clinical Intake:  Pre-visit preparation completed: No  Pain : No/denies pain     BMI - recorded: 27.32 Nutritional Status: BMI 25 -29 Overweight Nutritional Risks: None Diabetes: No  How often do you need to have someone help you when you read instructions, pamphlets, or other written materials from your doctor or pharmacy?: 1 - Never  Diabetic?  No  Interpreter Needed?: No  Information entered by :: Rolene Arbour LPN   Activities of Daily Living    10/29/2022   12:42 PM 10/26/2022   11:25 AM  In your present state of health, do you have any difficulty performing the following activities:  Hearing? 0 0  Vision? 0 0  Difficulty concentrating or making decisions? 0 0  Walking or climbing stairs? 0 1  Dressing or bathing? 0 0  Doing errands, shopping? 0 0  Preparing Food and eating ? N N  Using the Toilet? N N  In the past six months, have you accidently leaked urine? N N  Do you have problems with loss of bowel control? N N  Managing your Medications? N N  Managing your Finances? N N  Housekeeping or managing your Housekeeping? N N    Patient Care Team: [name], MD as PCP - General (Family Medicine) [name], MD as PCP - Electrophysiology (Cardiology) [name], MD as PCP - Cardiology (Cardiology) Heath Lark, MD as Consulting Physician (Hematology and Oncology) Debbora Dus, [facility] as Pharmacist (Pharmacist)  Indicate any recent Medical Services you may have received from other than [facility] providers in the past year (date may be approximate).     Assessment:   This is a routine wellness examination for Marq.  Hearing/Vision screen Hearing Screening - Comments:: Denies hearing difficulties   Vision Screening - Comments:: Wears rx glasses -  up to date with routine eye exams with  Dr Satira Sark  Dietary issues and exercise activities discussed: Current Exercise Habits: Home exercise routine, Type of exercise: walking, Time  (Minutes): 50, Frequency (Times/Week): 7, Weekly Exercise (Minutes/Week): 350, Intensity: Moderate, Exercise limited by: None identified   Goals Addressed               This Visit's Progress     Patient stated (pt-stated)        Help my daughter through Chemotherapy.       Depression Screen    10/29/2022   12:41 PM 10/26/2022   11:23 AM 10/22/2021    9:13 AM 10/21/2020    9:28 AM 06/27/2020    8:54 AM 09/26/2019   11:32 AM 08/22/2019    7:35 AM  PHQ 2/9 Scores  PHQ - 2 Score 0 0 0 0 0 0 0  PHQ- 9 Score 0 0  0  0     Fall Risk    10/29/2022   12:42 PM 10/26/2022   11:25 AM 10/19/2022   10:49 AM 10/22/2021    9:11 AM 10/21/2020    9:28 AM  Fall Risk   Falls in the past year? '1 1 1 1 '$ 0  Comment    tripped   Number falls in past yr: 0 1 1 0 0  Comment    trimming shrubs   Injury with Fall? 0 0 0 0 0  Comment    soreness   Risk for fall due to : No Fall Risks History of fall(s)  Other (Comment) Medication side effect  Risk for fall due to: Comment    tripped   Follow up Falls prevention discussed Falls prevention discussed;Falls evaluation completed  Falls prevention discussed Falls evaluation completed;Falls prevention discussed    FALL RISK PREVENTION PERTAINING TO THE HOME:  Any stairs in or around the home? Yes  If so, are there any without handrails? No  Home free of loose throw rugs in walkways, pet beds, electrical cords, etc? Yes  Adequate lighting in your home to reduce risk of falls? Yes   ASSISTIVE DEVICES UTILIZED TO PREVENT FALLS:  Life alert? No  Use of a cane, walker or w/c? No  Grab bars in the bathroom? Yes  Shower chair or bench in shower? Yes  Elevated toilet seat or a handicapped toilet? Yes   TIMED UP AND GO:  Was the test performed? No . Audio Visit   Cognitive Function:    10/21/2020    9:29 AM 09/26/2019   11:35 AM 06/13/2018   11:50 AM  MMSE - Mini Mental State Exam  Orientation to time '5 5 5  '$ Orientation to Place '5 5 5   '$ Registration '3 3 3  '$ Attention/ Calculation 5 5 0  Recall '3 2 1  '$ Recall-comments   unable to recall 2 of 3 words  Language- name 2 objects   0  Language- repeat '1 1 1  '$ Language- follow 3 step command   3  Language- read & follow direction   0  Write a sentence   0  Copy design   0  Total score   18      11/05/2019   11:04 AM 07/26/2018    2:42 PM 01/26/2018    1:40 PM  Montreal Cognitive Assessment   Visuospatial/ Executive (0/5) '4 2 4  '$ Naming (0/3) '3 3 3  '$ Attention: Read list of digits (0/2) '2 2 1  '$ Attention: Read list of letters (  0/1) '1 1 1  '$ Attention: Serial 7 subtraction starting at 100 (0/3) '3 3 3  '$ Language: Repeat phrase (0/2) '2 2 1  '$ Language : Fluency (0/1) 1 1 0  Abstraction (0/2) '2 2 2  '$ Delayed Recall (0/5) '5 4 3  '$ Orientation (0/6) '6 5 6  '$ Total '29 25 24      '$ 10/29/2022   12:45 PM 10/26/2022   11:26 AM  6CIT Screen  What Year? 0 points 0 points  What month? 0 points 0 points  What time? 0 points 0 points  Count back from 20 0 points 0 points  Months in reverse 0 points 0 points  Repeat phrase 0 points 0 points  Total Score 0 points 0 points    Immunizations Immunization History  Administered Date(s) Administered   Fluad Quad(high Dose 65+) 08/14/2019, 09/22/2020   Influenza Split 09/26/2012   Influenza, High Dose Seasonal PF 09/02/2015, 08/24/2016, 09/04/2018, 09/07/2021, 09/13/2022   Influenza,inj,Quad PF,6+ Mos 09/25/2013, 09/12/2014   Influenza-Unspecified 09/15/2017, 09/05/2018   PFIZER(Purple Top)SARS-COV-2 Vaccination 01/11/2020, 02/01/2020, 08/29/2020, 04/17/2021   PNEUMOCOCCAL CONJUGATE-20 09/07/2021   Pfizer Covid-19 Vaccine Bivalent Booster 104yr & up 09/07/2021, 09/20/2022   Pneumococcal Conjugate-13 12/13/2013   Pneumococcal Polysaccharide-23 02/09/2016   Respiratory Syncytial Virus Vaccine,Recomb Aduvanted(Arexvy) 09/13/2022   Tdap 01/13/2016   Zoster Recombinat (Shingrix) 12/21/2017, 02/24/2018    TDAP status: Up to date  Flu Vaccine  status: Up to date  Pneumococcal vaccine status: Up to date  Covid-19 vaccine status: Completed vaccines  Qualifies for Shingles Vaccine? Yes   Zostavax completed Yes   Shingrix Completed?: Yes  Screening Tests Health Maintenance  Topic Date Due   COVID-19 Vaccine (7 - Pfizer risk series) 11/15/2022   Medicare Annual Wellness (AWV)  10/30/2023   COLONOSCOPY (Pts 45-423yrInsurance coverage will need to be confirmed)  11/29/2023   TETANUS/TDAP  01/12/2026   Pneumonia Vaccine 6554Years old  Completed   INFLUENZA VACCINE  Completed   Hepatitis C Screening  Completed   Zoster Vaccines- Shingrix  Completed   HPV VACCINES  Aged Out    Health Maintenance  There are no preventive care reminders to display for this patient.  Colorectal cancer screening: No longer required.   Lung Cancer Screening: (Low Dose CT Chest recommended if Age 79-80ears, 30 pack-year currently smoking OR have quit w/in 15years.) does not qualify.     Additional Screening:  Hepatitis C Screening: does qualify; Completed 10/28/20  Vision Screening: Recommended annual ophthalmology exams for early detection of glaucoma and other disorders of the eye. Is the patient up to date with their annual eye exam?  Yes  Who is the provider or what is the name of the office in which the patient attends annual eye exams? Dr TaSatira Sarkf pt is not established with a provider, would they like to be referred to a provider to establish care? No .   Dental Screening: Recommended annual dental exams for proper oral hygiene  Community Resource Referral / Chronic Care Management:  CRR required this visit?  No   CCM required this visit?  No      Plan:     I have personally reviewed and noted the following in the patient's chart:   Medical and social history Use of alcohol, tobacco or illicit drugs  Current medications and supplements including opioid prescriptions. Patient is not currently taking opioid  prescriptions. Functional ability and status Nutritional status Physical activity Advanced directives List of other physicians Hospitalizations, surgeries, and ER visits  in previous 12 months Vitals Screenings to include cognitive, depression, and falls Referrals and appointments  In addition, I have reviewed and discussed with patient certain preventive protocols, quality metrics, and best practice recommendations. A written personalized care plan for preventive services as well as general preventive health recommendations were provided to patient.     Criselda Peaches, LPN   28/03/1323   Nurse Notes: None

## 2022-10-29 NOTE — Patient Instructions (Addendum)
[name] , Thank you for taking time to come for your Medicare Wellness Visit. I appreciate your ongoing commitment to your health goals. Please review the following plan we discussed and let me know if I can assist you in the future.   These are the goals we discussed:  Goals       DIET - EAT MORE FRUITS AND VEGETABLES      DIET - INCREASE WATER INTAKE      Starting 06/13/2018, I will continue to drink at least 2 liters of water daily.       Patient Stated      09/26/2019, Patient wants to focus on improving his energy level after just having heart surgery      Patient Stated      10/21/2020, I will continue to walk everyday for 2 miles.       Patient Stated      Continue to walk 2 miles and drinking more water. Eating a heart healthy diet.      Patient stated (pt-stated)      Help my daughter through Chemotherapy.      Pharmacy Care Plan      CARE PLAN ENTRY  Current Barriers:  Chronic Disease Management support, education, and care coordination needs related to coronary artery disease, hypertension, A-fib, allergic rhinitis, sleep apnea, GERD, neuropathy, osteoarthritis, vitamin D deficiency, hyperlipidemia, BPH, iron deficiency anemia  Pharmacist Clinical Goal(s):  Improve cholesterol to goal of LDL less than 70 mg/dL. Discuss increasing rosuvastatin from 10 mg to 20 mg with Dr. [name]. Repeat lipid panel within 12 weeks.   Interventions: Comprehensive medication review performed.  Patient Self Care Activities:  Self administers medications as prescribed  Initial goal documentation        This is a list of the screening recommended for you and due dates:  Health Maintenance  Topic Date Due   COVID-19 Vaccine (7 - Pfizer risk series) 11/15/2022   Medicare Annual Wellness Visit  10/30/2023   Colon Cancer Screening  11/29/2023   Tetanus Vaccine  01/12/2026   Pneumonia Vaccine  Completed   Flu Shot  Completed   Hepatitis C Screening: USPSTF Recommendation to screen -  Ages 18-79 yo.  Completed   Zoster (Shingles) Vaccine  Completed   HPV Vaccine  Aged Out    Advanced directives: In chart  Conditions/risks identified: None  Next appointment: Follow up in one year for your annual wellness visit.    Preventive Care 79 Years and Older, Male  Preventive care refers to lifestyle choices and visits with your health care provider that can promote health and wellness. What does preventive care include? A yearly physical exam. This is also called an annual well check. Dental exams once or twice a year. Routine eye exams. Ask your health care provider how often you should have your eyes checked. Personal lifestyle choices, including: Daily care of your teeth and gums. Regular physical activity. Eating a healthy diet. Avoiding tobacco and drug use. Limiting alcohol use. Practicing safe sex. Taking low doses of aspirin every day. Taking vitamin and mineral supplements as recommended by your health care provider. What happens during an annual well check? The services and screenings done by your health care provider during your annual well check will depend on your age, overall health, lifestyle risk factors, and family history of disease. Counseling  Your health care provider may ask you questions about your: Alcohol use. Tobacco use. Drug use. Emotional well-being. Home and relationship well-being. Sexual  activity. Eating habits. History of falls. Memory and ability to understand (cognition). Work and work Statistician. Screening  You may have the following tests or measurements: Height, weight, and BMI. Blood pressure. Lipid and cholesterol levels. These may be checked every 5 years, or more frequently if you are over 7 years old. Skin check. Lung cancer screening. You may have this screening every year starting at age 27 if you have a 30-pack-year history of smoking and currently smoke or have quit within the past 15 years. Fecal occult blood  test (FOBT) of the stool. You may have this test every year starting at age 58. Flexible sigmoidoscopy or colonoscopy. You may have a sigmoidoscopy every 5 years or a colonoscopy every 10 years starting at age 14. Prostate cancer screening. Recommendations will vary depending on your family history and other risks. Hepatitis C blood test. Hepatitis B blood test. Sexually transmitted disease (STD) testing. Diabetes screening. This is done by checking your blood sugar (glucose) after you have not eaten for a while (fasting). You may have this done every 1-3 years. Abdominal aortic aneurysm (AAA) screening. You may need this if you are a current or former smoker. Osteoporosis. You may be screened starting at age 70 if you are at high risk. Talk with your health care provider about your test results, treatment options, and if necessary, the need for more tests. Vaccines  Your health care provider may recommend certain vaccines, such as: Influenza vaccine. This is recommended every year. Tetanus, diphtheria, and acellular pertussis (Tdap, Td) vaccine. You may need a Td booster every 10 years. Zoster vaccine. You may need this after age 12. Pneumococcal 13-valent conjugate (PCV13) vaccine. One dose is recommended after age 108. Pneumococcal polysaccharide (PPSV23) vaccine. One dose is recommended after age 35. Talk to your health care provider about which screenings and vaccines you need and how often you need them. This information is not intended to replace advice given to you by your health care provider. Make sure you discuss any questions you have with your health care provider. Document Released: 01/09/2016 Document Revised: 09/01/2016 Document Reviewed: 10/14/2015 Elsevier Interactive Patient Education  2017 [place] Prevention in the Home Falls can cause injuries. They can happen to people of all ages. There are many things you can do to make your home safe and to help prevent  falls. What can I do on the outside of my home? Regularly fix the edges of walkways and driveways and fix any cracks. Remove anything that might make you trip as you walk through a door, such as a raised step or threshold. Trim any bushes or trees on the path to your home. Use bright outdoor lighting. Clear any walking paths of anything that might make someone trip, such as rocks or tools. Regularly check to see if handrails are loose or broken. Make sure that both sides of any steps have handrails. Any raised decks and porches should have guardrails on the edges. Have any leaves, snow, or ice cleared regularly. Use sand or salt on walking paths during winter. Clean up any spills in your garage right away. This includes oil or grease spills. What can I do in the bathroom? Use night lights. Install grab bars by the toilet and in the tub and shower. Do not use towel bars as grab bars. Use non-skid mats or decals in the tub or shower. If you need to sit down in the shower, use a plastic, non-slip stool. Keep the floor dry.  Clean up any water that spills on the floor as soon as it happens. Remove soap buildup in the tub or shower regularly. Attach bath mats securely with double-sided non-slip rug tape. Do not have throw rugs and other things on the floor that can make you trip. What can I do in the bedroom? Use night lights. Make sure that you have a light by your bed that is easy to reach. Do not use any sheets or blankets that are too big for your bed. They should not hang down onto the floor. Have a firm chair that has side arms. You can use this for support while you get dressed. Do not have throw rugs and other things on the floor that can make you trip. What can I do in the kitchen? Clean up any spills right away. Avoid walking on wet floors. Keep items that you use a lot in easy-to-reach places. If you need to reach something above you, use a strong step stool that has a grab  bar. Keep electrical cords out of the way. Do not use floor polish or wax that makes floors slippery. If you must use wax, use non-skid floor wax. Do not have throw rugs and other things on the floor that can make you trip. What can I do with my stairs? Do not leave any items on the stairs. Make sure that there are handrails on both sides of the stairs and use them. Fix handrails that are broken or loose. Make sure that handrails are as long as the stairways. Check any carpeting to make sure that it is firmly attached to the stairs. Fix any carpet that is loose or worn. Avoid having throw rugs at the top or bottom of the stairs. If you do have throw rugs, attach them to the floor with carpet tape. Make sure that you have a light switch at the top of the stairs and the bottom of the stairs. If you do not have them, ask someone to add them for you. What else can I do to help prevent falls? Wear shoes that: Do not have high heels. Have rubber bottoms. Are comfortable and fit you well. Are closed at the toe. Do not wear sandals. If you use a stepladder: Make sure that it is fully opened. Do not climb a closed stepladder. Make sure that both sides of the stepladder are locked into place. Ask someone to hold it for you, if possible. Clearly mark and make sure that you can see: Any grab bars or handrails. First and last steps. Where the edge of each step is. Use tools that help you move around (mobility aids) if they are needed. These include: Canes. Walkers. Scooters. Crutches. Turn on the lights when you go into a dark area. Replace any light bulbs as soon as they burn out. Set up your furniture so you have a clear path. Avoid moving your furniture around. If any of your floors are uneven, fix them. If there are any pets around you, be aware of where they are. Review your medicines with your doctor. Some medicines can make you feel dizzy. This can increase your chance of falling. Ask  your doctor what other things that you can do to help prevent falls. This information is not intended to replace advice given to you by your health care provider. Make sure you discuss any questions you have with your health care provider. Document Released: 10/09/2009 Document Revised: 05/20/2016 Document Reviewed: 01/17/2015 Elsevier Interactive Patient Education  2017 [place].

## 2022-11-02 ENCOUNTER — Ambulatory Visit (INDEPENDENT_AMBULATORY_CARE_PROVIDER_SITE_OTHER): Payer: Medicare Other | Admitting: Family Medicine

## 2022-11-02 ENCOUNTER — Encounter: Payer: Self-pay | Admitting: Cardiology

## 2022-11-02 ENCOUNTER — Encounter: Payer: Self-pay | Admitting: Family Medicine

## 2022-11-02 VITALS — BP 110/60 | HR 84 | Temp 99.1°F | Ht 67.25 in | Wt 189.2 lb

## 2022-11-02 DIAGNOSIS — I5032 Chronic diastolic (congestive) heart failure: Secondary | ICD-10-CM | POA: Diagnosis not present

## 2022-11-02 DIAGNOSIS — G608 Other hereditary and idiopathic neuropathies: Secondary | ICD-10-CM | POA: Diagnosis not present

## 2022-11-02 DIAGNOSIS — G6181 Chronic inflammatory demyelinating polyneuritis: Secondary | ICD-10-CM

## 2022-11-02 DIAGNOSIS — I10 Essential (primary) hypertension: Secondary | ICD-10-CM | POA: Diagnosis not present

## 2022-11-02 DIAGNOSIS — I2510 Atherosclerotic heart disease of native coronary artery without angina pectoris: Secondary | ICD-10-CM | POA: Diagnosis not present

## 2022-11-02 DIAGNOSIS — M4810 Ankylosing hyperostosis [Forestier], site unspecified: Secondary | ICD-10-CM

## 2022-11-02 DIAGNOSIS — D539 Nutritional anemia, unspecified: Secondary | ICD-10-CM | POA: Diagnosis not present

## 2022-11-02 DIAGNOSIS — I480 Paroxysmal atrial fibrillation: Secondary | ICD-10-CM

## 2022-11-02 LAB — LIPID PANEL
Cholesterol: 136 mg/dL (ref 0–200)
HDL: 68.6 mg/dL (ref >39.00)
LDL Cholesterol: 59 mg/dL (ref 0–99)
NonHDL: 67.68
Total CHOL/HDL Ratio: 2
Triglycerides: 45 mg/dL (ref 0.0–149.0)
VLDL: 9 mg/dL (ref 0.0–40.0)

## 2022-11-02 NOTE — Assessment & Plan Note (Signed)
Chronic, followed by oncology, slight trend down.  Due for reevaluation in May.  Patient asymptomatic.

## 2022-11-02 NOTE — Assessment & Plan Note (Signed)
Chronic, Stable followed by neurology

## 2022-11-02 NOTE — Assessment & Plan Note (Signed)
Followed by Rheum (Dr. Marijean Bravo).  Daily exercise.

## 2022-11-02 NOTE — Patient Instructions (Signed)
Please stop at the lab to have labs drawn.  

## 2022-11-02 NOTE — Assessment & Plan Note (Signed)
Followed by Dr. Burt Knack and Dr. Curt Bears Status post pacemaker On Eliquis anticoagulation

## 2022-11-02 NOTE — Assessment & Plan Note (Signed)
Stable, chronic.  Continue current medication.  Entresto 24/26 mg 1 tablet twice daily, Maxide 37.5/25 mg 1 tablet daily

## 2022-11-02 NOTE — Assessment & Plan Note (Signed)
Chronic, euvolemic in office today.

## 2022-11-02 NOTE — Progress Notes (Signed)
Patient ID: Derrick [name], male    DOB: [birth year], 81 y.o.   MRN: [id number]  This visit was conducted in person.  Pulse 84   Temp 99.1 F (37.3 C) (Oral)   Ht 5' 7.25" (1.708 m)   Wt 189 lb 4 oz (85.8 kg)   SpO2 97%   BMI 29.42 kg/m    CC:  Chief Complaint  Patient presents with   Annual Exam    Part 2    Subjective:   HPI: Derrick [name] is a 81 y.o. male presenting on 11/02/2022 for Annual Exam (Part 2)  The patient presents for review of chronic health problems. He/She also has the following acute concerns today:  The patient saw a LPN or RN for medicare wellness visit.  Prevention and wellness was reviewed in detail. Note reviewed and important notes copied below.  CAD,Paroxsysmal Afib/ heart block: Followed by Dr. Burt Knack and Dr.Camnitz  S/P  Biventricular pacemaker  On eliquis anticoagulation.  Elevated Cholesterol: Due for re-eval on statin. Using medications without problems:none Muscle aches: none Diet compliance: heart healthy diet Exercise: walking 2 miles daily Other complaints:     Hypertension:   Well controlled.  BP Readings from Last 3 Encounters:  11/02/22 110/60  10/08/22 114/62  09/23/22 (!) 104/56  Using medication without problems or lightheadedness:  none Chest pain with exertion: none Edema:none Short of breath: none Average home BPs: Other issues:  Anemia: followed by Dr. Alvy Bimler, hemoglobin stable at last check September 21, 2022 at 12.7   MGUS, IgM Dx 2008:  Treated with ritoxin for 2 years in [place]. Minimal risk of multiple myeloma.. released from  Oncologist.  Neuropathy followed by neurology   DISH: diffuse.. Pain in right knee, hands, back, ankles  Followed by Rheum (Dr. Marijean Bravo).  Daily exercise.   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 Minimal symptoms, no longer on pulmicort. Rarely using albuterol. Only issue is when he gets lung infection.. Needs inhaler.   OSA on CPAP.    Hx of  colon cancer 2000   Relevant past medical, surgical, family and social history reviewed and updated as indicated. Interim medical history since our last visit reviewed. Allergies and medications reviewed and updated. Outpatient Medications Prior to Visit  Medication Sig Dispense Refill   albuterol (VENTOLIN HFA) 108 (90 Base) MCG/ACT inhaler Inhale 2 puffs into the lungs every 6 (six) hours as needed for wheezing or shortness of breath. 8 g 2   amoxicillin (AMOXIL) 500 MG capsule TAKE 2 TABLETS BY MOUTH 1 HOUR PRIOR TO DENTAL APPOINTMENT AND 2 TABLETS IMMEDIATELY FOLLOWING DENTAL APPOINTMENT     apixaban (ELIQUIS) 5 MG TABS tablet Take 1 tablet (5 mg total) by mouth 2 (two) times daily. 180 tablet 1   bimatoprost (LUMIGAN) 0.01 % SOLN Place 1 drop into both eyes at bedtime.     Cholecalciferol 25 MCG (1000 UT) tablet Take 1,000 Units by mouth daily.      diclofenac Sodium (VOLTAREN) 1 % GEL Apply 2 g topically 4 (four) times daily as needed (pain).     dorzolamide-timolol (COSOPT) 22.3-6.8 MG/ML ophthalmic solution Place 1 drop into both eyes 2 (two) times daily.      dronedarone (MULTAQ) 400 MG tablet Take 1 tablet (400 mg total) by mouth 2 (two) times daily with a meal. 180 tablet 3   finasteride (PROSCAR) 5 MG tablet Take 5 mg by mouth daily after breakfast.      fluticasone (FLONASE) 50  MCG/ACT nasal spray Place 2 sprays into both nostrils 2 (two) times daily.     Fluticasone Propionate, Inhal, (FLOVENT DISKUS) 100 MCG/ACT AEPB Inhale 1 puff into the lungs 2 (two) times daily as needed. 28 each 2   Multiple Vitamin (MULTIVITAMIN WITH MINERALS) TABS tablet Take 1 tablet by mouth daily.     pantoprazole (PROTONIX) 40 MG tablet Take 1 tablet by mouth once daily 90 tablet 3   Polyethyl Glycol-Propyl Glycol (SYSTANE OP) Place 1 drop into both eyes daily as needed (dry eyes).     rosuvastatin (CRESTOR) 20 MG tablet Take 1 tablet by mouth once daily 90 tablet 3   sacubitril-valsartan (ENTRESTO)  24-26 MG Take 1 tablet by mouth 2 (two) times daily. 180 tablet 3   tadalafil (CIALIS) 5 MG tablet Take 5 mg by mouth at bedtime.      triamterene-hydrochlorothiazide (MAXZIDE-25) 37.5-25 MG tablet Take 1 tablet by mouth once daily 90 tablet 3   No facility-administered medications prior to visit.     Per HPI unless specifically indicated in ROS section below Review of Systems  Constitutional:  Negative for fatigue and fever.  HENT:  Negative for ear pain.   Eyes:  Negative for pain.  Respiratory:  Negative for cough and shortness of breath.   Cardiovascular:  Negative for chest pain, palpitations and leg swelling.  Gastrointestinal:  Negative for abdominal pain.  Genitourinary:  Negative for dysuria.  Musculoskeletal:  Negative for arthralgias.  Neurological:  Negative for syncope, light-headedness and headaches.  Psychiatric/Behavioral:  Negative for dysphoric mood.    Objective:  Pulse 84   Temp 99.1 F (37.3 C) (Oral)   Ht 5' 7.25" (1.708 m)   Wt 189 lb 4 oz (85.8 kg)   SpO2 97%   BMI 29.42 kg/m   Wt Readings from Last 3 Encounters:  11/02/22 189 lb 4 oz (85.8 kg)  10/29/22 185 lb (83.9 kg)  10/26/22 192 lb (87.1 kg)      Physical Exam Constitutional:      General: He is not in acute distress.    Appearance: Normal appearance. He is well-developed. He is not ill-appearing or toxic-appearing.  HENT:     Head: Normocephalic and atraumatic.     Right Ear: Hearing, tympanic membrane, ear canal and external ear normal.     Left Ear: Hearing, tympanic membrane, ear canal and external ear normal.     Nose: Nose normal.     Mouth/Throat:     Pharynx: Uvula midline.  Eyes:     General: Lids are normal. Lids are everted, no foreign bodies appreciated.     Conjunctiva/sclera: Conjunctivae normal.     Pupils: Pupils are equal, round, and reactive to light.  Neck:     Thyroid: No thyroid mass or thyromegaly.     Vascular: No carotid bruit.     Trachea: Trachea and  phonation normal.  Cardiovascular:     Rate and Rhythm: Normal rate and regular rhythm.     Pulses: Normal pulses.     Heart sounds: S1 normal and S2 normal. No murmur heard.    No gallop.  Pulmonary:     Breath sounds: Normal breath sounds. No wheezing, rhonchi or rales.  Abdominal:     General: Bowel sounds are normal.     Palpations: Abdomen is soft.     Tenderness: There is no abdominal tenderness. There is no guarding or rebound.     Hernia: No hernia is present.  Musculoskeletal:  Cervical back: Normal range of motion and neck supple.  Lymphadenopathy:     Cervical: No cervical adenopathy.  Skin:    General: Skin is warm and dry.     Findings: No rash.  Neurological:     Mental Status: He is alert.     Cranial Nerves: No cranial nerve deficit.     Sensory: No sensory deficit.     Gait: Gait normal.     Deep Tendon Reflexes: Reflexes are normal and symmetric.  Psychiatric:        Speech: Speech normal.        Behavior: Behavior normal.        Judgment: Judgment normal.       Results for orders placed or performed in visit on 10/08/22  [facility]  Result Value Ref Range   Date Time Interrogation Session [id number]    Pulse Generator Manufacturer MERM    Pulse Gen Model [id number] Percepta Quad CRT-P    Pulse Gen Serial Number [id number]    Clinic Name [facility]    Implantable Pulse Generator Type Cardiac Resynch Therapy Pacemaker    Implantable Pulse Generator Implant Date 32122482    Implantable Lead Manufacturer MERM    Implantable Lead Model 4398 Attain Performa Straight MRI SureScan    Implantable Lead Serial Number [id number]    Implantable Lead Implant Date 50037048    Implantable Lead Location Detail 1 UNKNOWN    Implantable Lead Location [id number]    Implantable Lead Manufacturer MERM    Implantable Lead Model 5076 CapSureFix Novus MRI SureScan    Implantable Lead Serial Number [id number]    Implantable Lead Implant Date  88828003    Implantable Lead Location Detail 1 APPENDAGE    Implantable Lead Location [id number]    Implantable Lead Manufacturer MERM    Implantable Lead Model 5076 CapSureFix Novus MRI SureScan    Implantable Lead Serial Number [id number]    Implantable Lead Implant Date [id number]    Implantable Lead Location Detail 1 APEX    Implantable Lead Location [id number]    Lead Channel Setting Sensing Sensitivity 0.9 mV   Lead Channel Setting Pacing Amplitude 1.5 V   Lead Channel Setting Pacing Pulse Width 0.4 ms   Lead Channel Setting Pacing Amplitude 2 V   Lead Channel Setting Pacing Pulse Width 0.4 ms   Lead Channel Setting Pacing Amplitude 1.75 V   Lead Channel Setting Pacing Capture Mode Adaptive Capture    Lead Channel Impedance Value 418 ohm   Lead Channel Impedance Value 342 ohm   Lead Channel Sensing Intrinsic Amplitude 1.125 mV   Lead Channel Sensing Intrinsic Amplitude 1.75 mV   Lead Channel Pacing Threshold Amplitude 0.625 V   Lead Channel Pacing Threshold Pulse Width 0.4 ms   Lead Channel Impedance Value 589 ohm   Lead Channel Impedance Value 399 ohm   Lead Channel Pacing Threshold Amplitude 1 V   Lead Channel Pacing Threshold Pulse Width 0.4 ms   Lead Channel Impedance Value 361 ohm   Lead Channel Impedance Value 304 ohm   Lead Channel Impedance Value 285 ohm   Lead Channel Impedance Value 247 ohm   Lead Channel Impedance Value 608 ohm   Lead Channel Impedance Value 589 ohm   Lead Channel Impedance Value 570 ohm   Lead Channel Impedance Value 380 ohm   Lead Channel Impedance Value 513 ohm   Lead Channel Impedance Value 494 ohm   Lead Channel Pacing Threshold Amplitude 1.625 V  Lead Channel Pacing Threshold Pulse Width 0.4 ms   Battery Status OK    Battery Remaining Longevity 128 mo   Battery Voltage 3.21 V   Brady Statistic RA Percent Paced 97.89 %   Brady Statistic RV Percent Paced 99.97 %   Brady Statistic AP VP Percent 97.92 %   Brady Statistic AS VP Percent 2.06 %    Brady Statistic AP VS Percent 0.02 %   Brady Statistic AS VS Percent 0.01 %     COVID 19 screen:  No recent travel or known exposure to COVID19 The patient denies respiratory symptoms of COVID 19 at this time. The importance of social distancing was discussed today.   Assessment and Plan The patient's preventative maintenance and recommended screening tests for an annual wellness exam were reviewed in full today. Brought up to date unless services declined.  Counselled on the importance of diet, exercise, and its role in overall health and mortality. The patient's FH and SH was reviewed, including their home life, tobacco status, and drug and alcohol status.   Smoking Status: nonsmoker ETOH/ drug use:  minimal/ none Vaccines: TDap, PNA uptodate, S/P COVID x 5, flu, PNA  20 every 5 years repeat given sarcoid  RSV 09/13/2022 Prostate Cancer Screen: BPH, hx of biopsy. PSA Followed by Dr. Alinda Money Colon Cancer Screen:  Dr. Hilarie Fredrickson Hx of colon cancer 11/2020 Colonoscopy on 3 year recall.. Hep C:  neg.   Problem List Items Addressed This Visit     (HFpEF) heart failure with preserved ejection fraction ([name])    Chronic, euvolemic in office today.      CIDP (chronic inflammatory demyelinating polyneuropathy) ([name])    Followed by Rheum (Dr. Marijean Bravo).  Daily exercise.      Deficiency anemia    Chronic, followed by oncology, slight trend down.  Due for reevaluation in May.  Patient asymptomatic.      DISH (diffuse idiopathic skeletal hyperostosis)   Essential hypertension, benign - Primary    Stable, chronic.  Continue current medication.  Entresto 24/26 mg 1 tablet twice daily, Maxide 37.5/25 mg 1 tablet daily      Relevant Orders   Lipid panel   Idiopathic sensorimotor axonal neuropathy    Chronic, Stable followed by neurology      Paroxysmal atrial fibrillation ([name])    Followed by Dr. Burt Knack and Dr. Curt Bears Status post pacemaker On Eliquis anticoagulation        [facility]  [name], MD

## 2022-11-03 DIAGNOSIS — R972 Elevated prostate specific antigen [PSA]: Secondary | ICD-10-CM | POA: Diagnosis not present

## 2022-11-04 ENCOUNTER — Ambulatory Visit: Payer: Medicare Other | Attending: Internal Medicine

## 2022-11-04 DIAGNOSIS — R001 Bradycardia, unspecified: Secondary | ICD-10-CM

## 2022-11-04 LAB — CUP PACEART INCLINIC DEVICE CHECK
Date Time Interrogation Session: 20231109144626
Implantable Lead Connection Status: 753985
Implantable Lead Connection Status: 753985
Implantable Lead Connection Status: 753985
Implantable Lead Implant Date: 20170517
Implantable Lead Implant Date: 20170517
Implantable Lead Implant Date: 20230928
Implantable Lead Location: 753858
Implantable Lead Location: 753859
Implantable Lead Location: 753860
Implantable Lead Model: 4398
Implantable Lead Model: 5076
Implantable Lead Model: 5076
Implantable Pulse Generator Implant Date: 20230928

## 2022-11-04 NOTE — Patient Instructions (Signed)
Follow up as scheduled.  

## 2022-11-04 NOTE — Progress Notes (Unsigned)
PATIENT: Jessica Seidman DOB: 1941/11/17  REASON FOR VISIT: follow up HISTORY FROM: patient  Chief Complaint  Patient presents with   Follow-up    Rm 4, alone.  Stable cpap and neuropathy.       HISTORY OF PRESENT ILLNESS: Today 11/07/22:.  Mr. 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  He reports overall everything has remained stable.  OSA: Download is below.  Reports it is working well.  States "can't live without it."   Neuropathy: Not on medication. Stable. No worse but no better. Trouble with balance. In the past year a couple of falls no injuries.  Not interested in physical therapy at this time    11/16/21: Mr. Atienza is a 81 y.o. male with a history of OSA on CPAP. His download attached in a separate note below shows excellent compliance with his CPAP. He is wearing it 100%of the time and for greater than 4 hours. His pressure setting is set on auto titration 5-15 cmH2O and he has a good treatment of his AHI 2.5.  He does have a small leak in the 95th percentile of 18.8 L/min, but he does not complain of feeling a leak and says it is likely positional.   His neuropathy has not improved, he is still having burning tingling and cramping in his feet. He states that he does not wish to start medications, he has been trying Voltaren gel for symptom management but doesn't find this very helpful. Patient reports tripping often and had a recent fall without injury. He reports that he uses a walker at times when he is tired. Encouraged patient to consider physical therapy to improve gait and decrease his falls.  HISTORY  11/10/20:   Mr. 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   His download indicates that he uses his machine nightly for compliance of 100%.  He uses machine greater than 4 hours each night.  On average he uses his machine 10 hours and 29  minutes.  Residual AHI is 2.6 on 5 to 15 cm of water with EPR 3.  Leak in the 95th percentile is 11.3.   He reports that he has burning and tingling in the legs pretty consistently.  He states that he was on gabapentin in the past but did not like the cognitive side effects.  He would rather not be on oral medication.  He states if he stands for a long period of time the numbness worsens and he feels like he may lose his balance.  He has not had any falls.  He does have a cane that he uses if needed.  He had a total knee replacement in March on the right.  He states that it has been a very painful experience and still experiences discomfort.  [name]    REVIEW OF SYSTEMS: Out of a complete 14 system review of symptoms, the patient complains only of the following symptoms, and all other reviewed systems are negative.    ESS 2  ALLERGIES: Allergies  Allergen Reactions   Nsaids     Contraindicated due to being on Eliquis.    HOME MEDICATIONS: Outpatient Medications Prior to Visit  Medication Sig Dispense Refill   albuterol (VENTOLIN HFA) 108 (90 Base) MCG/ACT inhaler Inhale 2 puffs into the lungs every 6 (six) hours as needed for wheezing or shortness of breath.  8 g 2   amoxicillin (AMOXIL) 500 MG capsule TAKE 2 TABLETS BY MOUTH 1 HOUR PRIOR TO DENTAL APPOINTMENT AND 2 TABLETS IMMEDIATELY FOLLOWING DENTAL APPOINTMENT     apixaban (ELIQUIS) 5 MG TABS tablet Take 1 tablet (5 mg total) by mouth 2 (two) times daily. 180 tablet 1   bimatoprost (LUMIGAN) 0.01 % SOLN Place 1 drop into both eyes at bedtime.     Cholecalciferol 25 MCG (1000 UT) tablet Take 1,000 Units by mouth daily.      diclofenac Sodium (VOLTAREN) 1 % GEL Apply 2 g topically 4 (four) times daily as needed (pain).     dorzolamide-timolol (COSOPT) 22.3-6.8 MG/ML ophthalmic solution Place 1 drop into both eyes 2 (two) times daily.      dronedarone (MULTAQ) 400 MG tablet Take 1 tablet (400 mg total) by mouth 2 (two) times daily with a meal.  180 tablet 3   finasteride (PROSCAR) 5 MG tablet Take 5 mg by mouth daily after breakfast.      fluticasone (FLONASE) 50 MCG/ACT nasal spray Place 2 sprays into both nostrils 2 (two) times daily.     Fluticasone Propionate, Inhal, (FLOVENT DISKUS) 100 MCG/ACT AEPB Inhale 1 puff into the lungs 2 (two) times daily as needed. 28 each 2   Multiple Vitamin (MULTIVITAMIN WITH MINERALS) TABS tablet Take 1 tablet by mouth daily.     pantoprazole (PROTONIX) 40 MG tablet Take 1 tablet by mouth once daily 90 tablet 3   Polyethyl Glycol-Propyl Glycol (SYSTANE OP) Place 1 drop into both eyes daily as needed (dry eyes).     rosuvastatin (CRESTOR) 20 MG tablet Take 1 tablet by mouth once daily 90 tablet 3   sacubitril-valsartan (ENTRESTO) 24-26 MG Take 1 tablet by mouth 2 (two) times daily. 180 tablet 3   tadalafil (CIALIS) 5 MG tablet Take 5 mg by mouth at bedtime.      triamterene-hydrochlorothiazide (MAXZIDE-25) 37.5-25 MG tablet Take 1 tablet by mouth once daily 90 tablet 3   No facility-administered medications prior to visit.    PAST MEDICAL HISTORY: Past Medical History:  Diagnosis Date   Anemia    iron deficiency anemia- infusion on 02/15/2020 and 02/22/2020   BENIGN PROSTATIC HYPERTROPHY, WITH OBSTRUCTION 05/28/2010   CAD (coronary artery disease)    a. s/p NSTEMI 06/01/11: DES to RCA;  b. cath 06/25/11:   dLM 50-60% (FFR 0.87), prox to mid LAD 40-50%, D1 50%, pCFX 50%, RCA stent ok, dPDA 80%, EF 55-60%.  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/2014   DISH (diffuse idiopathic skeletal hyperostosis) 12/02/2015   Diverticulitis    GENERALIZED OSTEOARTHROSIS UNSPECIFIED SITE 05/28/2010   GERD 05/28/2010   Heart murmur    History of gout    History of kidney stones    HYPERLIPIDEMIA 05/28/2010   Hypertension    IgM lambda paraproteinemia    Intrinsic asthma, 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   "associated w/lung sarcoidosis"   Malignant neoplasm of descending colon ([place]) 05/28/2010   MITRAL VALVE  PROLAPSE 05/28/2010   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   NSTEMI (non-ST elevated myocardial infarction) ([place]) 06/03/11   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   OSA on CPAP 05/28/2010   PALPITATIONS, CHRONIC 05/28/2010   Parotid gland pain 2010   infection   Peripheral neuropathy    "tx'd w/targeted chemo" (05/12/2016)   Pneumonia 2-3 times   Presence of permanent cardiac pacemaker    [employer]   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   Sarcoidosis    pulmonalis   UNSPECIFIED INFLAMMATORY AND TOXIC NEUROPATHY 05/28/2010   Vision loss    VITAMIN D DEFICIENCY 05/28/2010    PAST SURGICAL HISTORY: Past Surgical History:  Procedure Laterality Date   APPENDECTOMY  1954   age 5   BIV UPGRADE N/A 09/23/2022   Procedure: BIV PACEMAKER UPGRADE;  Surgeon: [name], MD;  Location: [facility];  Service: Cardiovascular;  Laterality: N/A;   CATARACT EXTRACTION W/ INTRAOCULAR LENS IMPLANT Left    COLON SURGERY  2000   decending colon    CORONARY ANGIOPLASTY WITH STENT PLACEMENT  06/03/11   CORONARY ARTERY BYPASS GRAFT N/A 06/05/2019   Procedure: CORONARY ARTERY BYPASS GRAFTING (CABG) TIMES THREE USING LEFT INTERNAL MAMMARY ARTERY AND RIGHT GREATER SEPHANOUS VEIN;  Surgeon: [name], MD;  Location: [facility];  Service: Open Heart Surgery;  Laterality: N/A;   ELECTROPHYSIOLOGIC STUDY N/A 05/12/2016   Procedure: Cardioversion;  Surgeon: [name], MD;  Location: [facility];  Service:  Cardiovascular;  Laterality: N/A;   EP IMPLANTABLE DEVICE N/A 05/12/2016   Procedure: Pacemaker Implant;  Surgeon: [name], MD;  Location: [facility];  Service: Cardiovascular;  Laterality: N/A;   EYE SURGERY     bilateral cataract with lens implant   HERNIA REPAIR  01/2019   left inguinal hernia repair   INGUINAL HERNIA REPAIR Left 02/21/2019   Procedure: LEFT INGUINAL HERNIA REPAIR WITH MESH;  Surgeon: [name], MD;  Location: [facility];  Service: General;  Laterality: Left;   INSERT / REPLACE / REMOVE PACEMAKER  05/12/2016   INTRAVASCULAR PRESSURE WIRE/FFR STUDY N/A 05/15/2019   Procedure: INTRAVASCULAR PRESSURE WIRE/FFR STUDY;  Surgeon: [name], MD;  Location: [facility];  Service: Cardiovascular;  Laterality: N/A;   KNEE ARTHROSCOPY Right 2003   LEFT HEART CATH AND CORONARY ANGIOGRAPHY N/A 05/15/2019   Procedure: LEFT HEART CATH AND CORONARY ANGIOGRAPHY;  Surgeon: [name], MD;  Location: [facility];  Service: Cardiovascular;  Laterality: N/A;   LEFT HEART CATHETERIZATION WITH CORONARY ANGIOGRAM N/A 03/31/2015   Procedure: LEFT HEART CATHETERIZATION WITH CORONARY ANGIOGRAM;  Surgeon: [name], MD;  Location: [facility];  Service: Cardiovascular;  Laterality: N/A;   LUNG SURGERY  2001   "open lung dissection"   MOHS SURGERY Left ~ 2008   ear   PROSTATE BIOPSY  2010   TEE WITHOUT CARDIOVERSION N/A 06/05/2019   Procedure: TRANSESOPHAGEAL ECHOCARDIOGRAM (TEE);  Surgeon: [name], [name], MD;  Location: [facility];  Service: Open Heart Surgery;  Laterality: N/A;   TONSILLECTOMY  1950   TOTAL KNEE ARTHROPLASTY Right 03/11/2020   Procedure: TOTAL KNEE ARTHROPLASTY;  Surgeon: [name], MD;  Location: [facility];  Service: Orthopedics;  Laterality: Right;  70 mins    FAMILY HISTORY: Family History  Problem Relation Age of Onset   Arthritis Mother        severe   Coronary artery disease Mother    Colon polyps Mother  Heart disease Mother     Sleep apnea Mother    Sleep apnea Father    Coronary artery disease Brother    Cancer Brother        choriocarcinoma   Arrhythmia Brother 24       Afib/Tachycardia   Heart attack Brother    Congestive Heart Failure Brother    Sleep apnea Brother     SOCIAL HISTORY: Social History   Socioeconomic History   Marital status: Married    Spouse name: Not on file   Number of children: 2   Years of education: MS-[employer].   Highest education level: Not on file  Occupational History   Occupation: Consultant    Comment: Building services engineer  Tobacco Use   Smoking status: Never   Smokeless tobacco: Never  Vaping Use   Vaping Use: Never used  Substance and Sexual Activity   Alcohol use: Yes    Comment: occasional   Drug use: No   Sexual activity: Yes    Birth control/protection: None  Other Topics Concern   Not on file  Social History Narrative   College- [employer] [place] [employer]; [employer]-environment science and mgt. Married [name])  "61" 1 son- 46; 1 dtr "61" 2 grandchildren. 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 End of life-provided discussive context and provided packet.   Social Determinants of Health   Financial Resource Strain: Low Risk  (10/29/2022)   Overall Financial Resource Strain (CARDIA)    Difficulty of Paying Living Expenses: Not hard at all  Food Insecurity: No Food Insecurity (10/29/2022)   Hunger Vital Sign    Worried About Running Out of Food in the Last Year: Never true    Ran Out of Food in the Last Year: Never true  Transportation Needs: No Transportation Needs (10/29/2022)   PRAPARE - Hydrologist (Medical): No    Lack of Transportation (Non-Medical): No  Physical Activity: Sufficiently Active (10/29/2022)   Exercise Vital Sign    Days of Exercise per Week: 7 days    Minutes of Exercise per Session: 50 min  Stress: No Stress Concern Present (10/29/2022)   [place]    Feeling of Stress : Not at all  Social Connections: [place] (10/29/2022)   Social Connection and Isolation Panel [NHANES]    Frequency of Communication with Friends and Family: More than three times a week    Frequency of Social Gatherings with Friends and Family: More than three times a week    Attends Religious Services: More than 4 times per year    Active Member of [employer] or Organizations: Yes    Attends Archivist Meetings: More than 4 times per year    Marital Status: Married  Human resources officer Violence: Not At Risk (10/29/2022)   Humiliation, Afraid, Rape, and Kick questionnaire    Fear of Current or Ex-Partner: No    Emotionally Abused: No    Physically Abused: No    Sexually Abused: No      PHYSICAL EXAM  Vitals:   11/08/22 1253  BP: 95/61  Pulse: 76  Weight: 192 lb 3.2 oz (87.2 kg)  Height: '5\' 9"'$  (1.753 m)    Body mass index is 28.38 kg/m.  Generalized: Well developed, in no acute distress    Neurological examination  Mentation: Alert oriented to time, place, history taking. Follows all commands speech and language fluent Cranial nerve II-XII: Extraocular movements were full, visual field were  full on confrontational test Head turning and shoulder shrug  were normal and symmetric. Motor: The motor testing reveals 4 over 5 strength of all 4 extremities. Good symmetric motor tone is noted throughout.  Sensory: Sensory testing is intact to soft touch on all 4 extremities. No evidence of extinction is noted.  Gait and station: Gait is wide based.   DIAGNOSTIC DATA (LABS, IMAGING, TESTING) - I reviewed patient records, labs, notes, testing and imaging myself where available.  Lab Results  Component Value Date   WBC 4.4 09/21/2022   HGB 12.7 (L) 09/21/2022   HCT 37.9 09/21/2022   MCV 92 09/21/2022   PLT 154 09/21/2022      Component Value Date/Time   NA 137 09/21/2022 1459   NA 141 07/27/2016 0748   K 4.3 09/21/2022  1459   K 3.9 07/27/2016 0748   CL 102 09/21/2022 1459   CL 104 03/23/2013 0855   CO2 28 09/21/2022 1459   CO2 30 (H) 07/27/2016 0748   GLUCOSE 81 09/21/2022 1459   GLUCOSE 129 (H) 05/12/2022 1158   GLUCOSE 122 07/27/2016 0748   GLUCOSE 98 03/23/2013 0855   BUN 20 09/21/2022 1459   BUN 16.0 07/27/2016 0748   CREATININE 1.12 09/21/2022 1459   CREATININE 1.07 10/30/2021 1557   CREATININE 1.0 07/27/2016 0748   CALCIUM 10.4 (H) 09/21/2022 1459   CALCIUM 10.5 (H) 07/27/2016 0748   PROT 7.3 10/30/2021 1557   PROT 6.6 05/21/2020 0721   PROT 7.5 07/27/2016 0748   ALBUMIN 4.3 10/22/2020 0823   ALBUMIN 4.3 05/21/2020 0721   ALBUMIN 4.3 07/27/2016 0748   AST 20 10/30/2021 1557   AST 24 07/27/2016 0748   ALT 15 10/30/2021 1557   ALT 19 07/27/2016 0748   ALKPHOS 49 10/22/2020 0823   ALKPHOS 53 07/27/2016 0748   BILITOT 1.1 10/30/2021 1557   BILITOT 0.4 05/21/2020 0721   BILITOT 0.84 07/27/2016 0748   GFRNONAA >60 05/12/2022 1158   GFRAA >60 04/14/2020 1221   Lab Results  Component Value Date   CHOL 136 11/02/2022   HDL 68.60 11/02/2022   LDLCALC 59 11/02/2022   TRIG 45.0 11/02/2022   CHOLHDL 2 11/02/2022   Lab Results  Component Value Date   HGBA1C 5.0 02/01/2020   Lab Results  Component Value Date   DBZMCEYE23 361 06/18/2020   Lab Results  Component Value Date   TSH 2.06 06/18/2020      ASSESSMENT AND PLAN 81 y.o. year old male  has a past medical history of Anemia, BENIGN PROSTATIC HYPERTROPHY, WITH OBSTRUCTION (05/28/2010), CAD (coronary artery disease), Chronic back pain, CIDP (chronic inflammatory demyelinating polyneuropathy) ([place]) (04/11/2012), Colon cancer ([place]) (dx'd 2000), COLONIC POLYPS, ADENOMATOUS, HX OF (01/29/4496), Complication of anesthesia, COPD (chronic obstructive pulmonary disease) ([place]), Diffuse axonal neuropathy (10/16/2014), DISH (diffuse idiopathic skeletal hyperostosis) (12/02/2015), Diverticulitis, GENERALIZED OSTEOARTHROSIS UNSPECIFIED SITE  (05/28/2010), GERD (05/28/2010), Heart murmur, History of gout, History of kidney stones, HYPERLIPIDEMIA (05/28/2010), Hypertension, IgM lambda paraproteinemia, Intrinsic asthma, unspecified (05/28/2010), Malignant neoplasm of descending colon ([place]) (05/28/2010), MITRAL VALVE PROLAPSE (05/28/2010), Monoclonal gammopathy of undetermined significance (12/11/2011), NSTEMI (non-ST elevated myocardial infarction) ([place]) (06/03/11), Open-angle glaucoma of both eyes (12/2012), OSA on CPAP (05/28/2010), PALPITATIONS, CHRONIC (05/28/2010), Parotid gland pain (2010), Peripheral neuropathy, Pneumonia (2-3 times), Presence of permanent cardiac pacemaker, 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), Sarcoidosis, UNSPECIFIED INFLAMMATORY AND TOXIC NEUROPATHY (05/28/2010), Vision loss, and VITAMIN D DEFICIENCY (05/28/2010). here with:  OSA on CPAP  - CPAP compliance excellent - Good treatment of AHI  -  Encourage patient to use CPAP nightly and > 4 hours each night   2. Neuropathy  -Advised that we can consider physical therapy in the future  - F/U in 1 year or sooner if needed    Ward Givens, MSN, NP-C 11/07/2022, 4:41 PM [facility] [address], [place], [place] [address] [phone]

## 2022-11-04 NOTE — Progress Notes (Signed)
Pt in clinic follow up d/t diaphragmatic stimulation. [employer] representative present to assist with vector testing. Vectors tested.  Best vector LV2 to LV4.  Threshold 1.5V at 0.6 ms.  Pt reprogrammed.  Pt evaluated for stimulation in different positions.  No stim noted.  Pt has 91 day follow up scheduled.

## 2022-11-08 ENCOUNTER — Ambulatory Visit (INDEPENDENT_AMBULATORY_CARE_PROVIDER_SITE_OTHER): Payer: Medicare Other | Admitting: Adult Health

## 2022-11-08 ENCOUNTER — Encounter: Payer: Self-pay | Admitting: Adult Health

## 2022-11-08 VITALS — BP 95/61 | HR 76 | Ht 69.0 in | Wt 192.2 lb

## 2022-11-08 DIAGNOSIS — D472 Monoclonal gammopathy: Secondary | ICD-10-CM

## 2022-11-08 DIAGNOSIS — G63 Polyneuropathy in diseases classified elsewhere: Secondary | ICD-10-CM | POA: Diagnosis not present

## 2022-11-08 DIAGNOSIS — G4733 Obstructive sleep apnea (adult) (pediatric): Secondary | ICD-10-CM

## 2022-11-10 DIAGNOSIS — R3912 Poor urinary stream: Secondary | ICD-10-CM | POA: Diagnosis not present

## 2022-11-10 DIAGNOSIS — N401 Enlarged prostate with lower urinary tract symptoms: Secondary | ICD-10-CM | POA: Diagnosis not present

## 2022-11-10 DIAGNOSIS — N5201 Erectile dysfunction due to arterial insufficiency: Secondary | ICD-10-CM | POA: Diagnosis not present

## 2022-11-10 DIAGNOSIS — R972 Elevated prostate specific antigen [PSA]: Secondary | ICD-10-CM | POA: Diagnosis not present

## 2022-11-16 DIAGNOSIS — L578 Other skin changes due to chronic exposure to nonionizing radiation: Secondary | ICD-10-CM | POA: Diagnosis not present

## 2022-11-16 DIAGNOSIS — L570 Actinic keratosis: Secondary | ICD-10-CM | POA: Diagnosis not present

## 2022-11-16 DIAGNOSIS — Z85828 Personal history of other malignant neoplasm of skin: Secondary | ICD-10-CM | POA: Diagnosis not present

## 2022-11-16 DIAGNOSIS — L821 Other seborrheic keratosis: Secondary | ICD-10-CM | POA: Diagnosis not present

## 2022-11-16 DIAGNOSIS — D225 Melanocytic nevi of trunk: Secondary | ICD-10-CM | POA: Diagnosis not present

## 2022-11-16 DIAGNOSIS — L814 Other melanin hyperpigmentation: Secondary | ICD-10-CM | POA: Diagnosis not present

## 2022-11-17 ENCOUNTER — Ambulatory Visit: Payer: Medicare Other | Admitting: Adult Health

## 2022-11-17 DIAGNOSIS — H31002 Unspecified chorioretinal scars, left eye: Secondary | ICD-10-CM | POA: Diagnosis not present

## 2022-11-17 DIAGNOSIS — H35371 Puckering of macula, right eye: Secondary | ICD-10-CM | POA: Diagnosis not present

## 2022-11-17 DIAGNOSIS — H43813 Vitreous degeneration, bilateral: Secondary | ICD-10-CM | POA: Diagnosis not present

## 2022-11-17 DIAGNOSIS — H401112 Primary open-angle glaucoma, right eye, moderate stage: Secondary | ICD-10-CM | POA: Diagnosis not present

## 2022-12-01 ENCOUNTER — Ambulatory Visit (HOSPITAL_COMMUNITY): Payer: Medicare Other | Attending: Cardiovascular Disease

## 2022-12-01 DIAGNOSIS — I361 Nonrheumatic tricuspid (valve) insufficiency: Secondary | ICD-10-CM | POA: Diagnosis not present

## 2022-12-01 DIAGNOSIS — I2510 Atherosclerotic heart disease of native coronary artery without angina pectoris: Secondary | ICD-10-CM | POA: Insufficient documentation

## 2022-12-01 DIAGNOSIS — I480 Paroxysmal atrial fibrillation: Secondary | ICD-10-CM | POA: Insufficient documentation

## 2022-12-01 DIAGNOSIS — I340 Nonrheumatic mitral (valve) insufficiency: Secondary | ICD-10-CM | POA: Insufficient documentation

## 2022-12-01 DIAGNOSIS — I350 Nonrheumatic aortic (valve) stenosis: Secondary | ICD-10-CM | POA: Diagnosis not present

## 2022-12-01 LAB — ECHOCARDIOGRAM COMPLETE
AR max vel: 1.07 cm2
AV Area VTI: 1.04 cm2
AV Area mean vel: 0.94 cm2
AV Mean grad: 28 mmHg
AV Peak grad: 41.2 mmHg
Ao pk vel: 3.21 m/s
Area-P 1/2: 2.99 cm2
S' Lateral: 4.15 cm

## 2022-12-06 NOTE — Telephone Encounter (Signed)
Patient is following up, requesting to speak with Sonia Baller, RN.

## 2022-12-08 ENCOUNTER — Encounter: Payer: Self-pay | Admitting: Cardiovascular Disease

## 2022-12-08 ENCOUNTER — Ambulatory Visit: Payer: Medicare Other | Attending: Cardiovascular Disease | Admitting: Cardiovascular Disease

## 2022-12-08 VITALS — BP 136/70 | HR 68 | Ht 69.0 in | Wt 191.4 lb

## 2022-12-08 DIAGNOSIS — I5032 Chronic diastolic (congestive) heart failure: Secondary | ICD-10-CM | POA: Diagnosis not present

## 2022-12-08 DIAGNOSIS — E782 Mixed hyperlipidemia: Secondary | ICD-10-CM | POA: Insufficient documentation

## 2022-12-08 DIAGNOSIS — I340 Nonrheumatic mitral (valve) insufficiency: Secondary | ICD-10-CM | POA: Diagnosis not present

## 2022-12-08 DIAGNOSIS — I350 Nonrheumatic aortic (valve) stenosis: Secondary | ICD-10-CM | POA: Insufficient documentation

## 2022-12-08 DIAGNOSIS — I2510 Atherosclerotic heart disease of native coronary artery without angina pectoris: Secondary | ICD-10-CM | POA: Insufficient documentation

## 2022-12-08 NOTE — Progress Notes (Signed)
Cardiology Office Note:    Date:  12/08/2022   ID:  Ambrose, Wile 1941-05-16, MRN [id number]  PCP:  [name], MD   [place] Providers Cardiologist:  [name], MD Electrophysiologist:  [name], MD     Referring MD: [name], MD   Chief Complaint  Patient presents with   Coronary Artery Disease    History of Present Illness:    Derrick [name] is a 81 y.o. 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  He is doing very well.  He is walking 2 miles daily with his wife without any exertional symptoms.  He denies chest pain, chest pressure, or shortness of breath.  The patient underwent CRT upgrade in September 2023 and his LVEF has improved from 40 to 45% now up to 60 to 65%.  His mitral regurgitation is decreased from moderate down to mild.  He is feeling much more energetic.  He is very pleased with the results of his CRT upgrade.  He reports no changes in his medical program.  His beta-blocker was stopped back in June of this year due to profound fatigue and his energy is better since then.  Past Medical History:  Diagnosis Date   Anemia    iron deficiency anemia- infusion on 02/15/2020 and 02/22/2020   BENIGN PROSTATIC HYPERTROPHY, WITH OBSTRUCTION 05/28/2010   CAD (coronary artery disease)    a. s/p NSTEMI 06/01/11: DES to RCA;  b. cath 06/25/11:   dLM 50-60% (FFR 0.87), prox to mid LAD 40-50%, D1 50%, pCFX 50%, RCA stent ok, dPDA 80%, EF 55-60%.  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   DISH (diffuse idiopathic skeletal hyperostosis) 12/02/2015   Diverticulitis    GENERALIZED OSTEOARTHROSIS UNSPECIFIED SITE 05/28/2010   GERD 05/28/2010   Heart murmur    History of gout    History of kidney stones    HYPERLIPIDEMIA 05/28/2010   Hypertension    IgM lambda paraproteinemia    Intrinsic asthma, 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   "associated w/lung sarcoidosis"   Malignant neoplasm of descending colon ([place]) 05/28/2010   MITRAL VALVE PROLAPSE 05/28/2010   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   NSTEMI (non-ST elevated myocardial infarction) ([place]) 06/03/11   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   OSA on CPAP 05/28/2010   PALPITATIONS, CHRONIC 05/28/2010   Parotid gland pain 2010   infection   Peripheral neuropathy    "tx'd w/targeted chemo" (05/12/2016)   Pneumonia 2-3 times   Presence of permanent cardiac pacemaker    Medtronic   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   Sarcoidosis    pulmonalis  UNSPECIFIED INFLAMMATORY AND TOXIC NEUROPATHY 05/28/2010   Vision loss    VITAMIN D DEFICIENCY 05/28/2010    Past Surgical History:  Procedure Laterality Date   APPENDECTOMY  1954   age 71   BIV UPGRADE N/A 09/23/2022   Procedure: BIV PACEMAKER UPGRADE;  Surgeon: [name], MD;  Location: [place] [facility];  Service: Cardiovascular;  Laterality: N/A;   CATARACT EXTRACTION W/ INTRAOCULAR LENS IMPLANT Left    COLON SURGERY  2000   decending colon    CORONARY ANGIOPLASTY WITH STENT PLACEMENT  06/03/11   CORONARY ARTERY BYPASS GRAFT  N/A 06/05/2019   Procedure: CORONARY ARTERY BYPASS GRAFTING (CABG) TIMES THREE USING LEFT INTERNAL MAMMARY ARTERY AND RIGHT GREATER SEPHANOUS VEIN;  Surgeon: [name], MD;  Location: [facility];  Service: Open Heart Surgery;  Laterality: N/A;   ELECTROPHYSIOLOGIC STUDY N/A 05/12/2016   Procedure: Cardioversion;  Surgeon: [name], MD;  Location: [place] [facility];  Service: Cardiovascular;  Laterality: N/A;   EP IMPLANTABLE DEVICE N/A 05/12/2016   Procedure: Pacemaker Implant;  Surgeon: [name], MD;  Location: [place] [facility];  Service: Cardiovascular;  Laterality: N/A;   EYE SURGERY     bilateral cataract with lens implant   HERNIA REPAIR  01/2019   left inguinal hernia repair   INGUINAL HERNIA REPAIR Left 02/21/2019   Procedure: LEFT INGUINAL HERNIA REPAIR WITH MESH;  Surgeon: [name], MD;  Location: [place] [place];  Service: General;  Laterality: Left;   INSERT / REPLACE / REMOVE PACEMAKER  05/12/2016   INTRAVASCULAR PRESSURE WIRE/FFR STUDY N/A 05/15/2019   Procedure: INTRAVASCULAR PRESSURE WIRE/FFR STUDY;  Surgeon: [name], MD;  Location: [place] [facility];  Service: Cardiovascular;  Laterality: N/A;   KNEE ARTHROSCOPY Right 2003   LEFT HEART CATH AND CORONARY ANGIOGRAPHY N/A 05/15/2019   Procedure: LEFT HEART CATH AND CORONARY ANGIOGRAPHY;  Surgeon: [name], MD;  Location: [place] [facility];  Service: Cardiovascular;  Laterality: N/A;   LEFT HEART CATHETERIZATION WITH CORONARY ANGIOGRAM N/A 03/31/2015   Procedure: LEFT HEART CATHETERIZATION WITH CORONARY ANGIOGRAM;  Surgeon: [name], MD;  Location[facility];  Service: Cardiovascular;  Laterality: N/A;   LUNG SURGERY  2001   "open lung dissection"   MOHS SURGERY Left ~ 2008   ear   PROSTATE BIOPSY  2010   TEE WITHOUT CARDIOVERSION N/A 06/05/2019   Procedure: TRANSESOPHAGEAL ECHOCARDIOGRAM (TEE);  Surgeon: [name], [name], MD;  Location: [place];  Service: Open Heart Surgery;  Laterality: N/A;    TONSILLECTOMY  1950   TOTAL KNEE ARTHROPLASTY Right 03/11/2020   Procedure: TOTAL KNEE ARTHROPLASTY;  Surgeon: [name], MD;  Location: [facility];  Service: Orthopedics;  Laterality: Right;  70 mins    Current Medications: Current Meds  Medication Sig   albuterol (VENTOLIN HFA) 108 (90 Base) MCG/ACT inhaler Inhale 2 puffs into the lungs every 6 (six) hours as needed for wheezing or shortness of breath.   amoxicillin (AMOXIL) 500 MG capsule TAKE 2 TABLETS BY MOUTH 1 HOUR PRIOR TO DENTAL APPOINTMENT AND 2 TABLETS IMMEDIATELY FOLLOWING DENTAL APPOINTMENT   apixaban (ELIQUIS) 5 MG TABS tablet Take 1 tablet (5 mg total) by mouth 2 (two) times daily.   bimatoprost (LUMIGAN) 0.01 % SOLN Place 1 drop into both eyes at bedtime.   Cholecalciferol 25 MCG (1000 UT) tablet Take 1,000 Units by mouth daily.    diclofenac Sodium (VOLTAREN) 1 % GEL Apply 2 g topically 4 (four) times daily  as needed (pain).   dorzolamide-timolol (COSOPT) 22.3-6.8 MG/ML ophthalmic solution Place 1 drop into both eyes 2 (two) times daily.    dronedarone (MULTAQ) 400 MG tablet Take 1 tablet (400 mg total) by mouth 2 (two) times daily with a meal.   finasteride (PROSCAR) 5 MG tablet Take 5 mg by mouth daily after breakfast.    fluticasone (FLONASE) 50 MCG/ACT nasal spray Place 2 sprays into both nostrils 2 (two) times daily.   Fluticasone Propionate, Inhal, (FLOVENT DISKUS) 100 MCG/ACT AEPB Inhale 1 puff into the lungs 2 (two) times daily as needed.   Multiple Vitamin (MULTIVITAMIN WITH MINERALS) TABS tablet Take 1 tablet by mouth daily.   pantoprazole (PROTONIX) 40 MG tablet Take 1 tablet by mouth once daily   Polyethyl Glycol-Propyl Glycol (SYSTANE OP) Place 1 drop into both eyes daily as needed (dry eyes).   rosuvastatin (CRESTOR) 20 MG tablet Take 1 tablet by mouth once daily   sacubitril-valsartan (ENTRESTO) 24-26 MG Take 1 tablet by mouth 2 (two) times daily.   tadalafil (CIALIS) 5 MG tablet Take 5 mg by mouth at  bedtime.    triamterene-hydrochlorothiazide (MAXZIDE-25) 37.5-25 MG tablet Take 1 tablet by mouth once daily     Allergies:   Nsaids   Social History   Socioeconomic History   Marital status: Married    Spouse name: Not on file   Number of children: 2   Years of education: MS-[employer].   Highest education level: Not on file  Occupational History   Occupation: Consultant    Comment: Building services engineer  Tobacco Use   Smoking status: Never   Smokeless tobacco: Never  Vaping Use   Vaping Use: Never used  Substance and Sexual Activity   Alcohol use: Yes    Comment: occasional   Drug use: No   Sexual activity: Yes    Birth control/protection: None  Other Topics Concern   Not on file  Social History Narrative   College- [employer]; [employer]-environment science and mgt. Married [name])  "39" 1 son- 43; 1 dtr "81" 2 grandchildren. 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 End of life-provided discussive context and provided packet.   Social Determinants of Health   Financial Resource Strain: Low Risk  (10/29/2022)   Overall Financial Resource Strain (CARDIA)    Difficulty of Paying Living Expenses: Not hard at all  Food Insecurity: No Food Insecurity (10/29/2022)   Hunger Vital Sign    Worried About Running Out of Food in the Last Year: Never true    Ran Out of Food in the Last Year: Never true  Transportation Needs: No Transportation Needs (10/29/2022)   PRAPARE - Hydrologist (Medical): No    Lack of Transportation (Non-Medical): No  Physical Activity: Sufficiently Active (10/29/2022)   Exercise Vital Sign    Days of Exercise per Week: 7 days    Minutes of Exercise per Session: 50 min  Stress: No Stress Concern Present (10/29/2022)   [place]    Feeling of Stress : Not at all  Social Connections: [place] (10/29/2022)   Social Connection and Isolation Panel  [NHANES]    Frequency of Communication with Friends and Family: More than three times a week    Frequency of Social Gatherings with Friends and Family: More than three times a week    Attends Religious Services: More than 4 times per year    Active Member of [employer] or Organizations: Yes  Attends Music therapist: More than 4 times per year    Marital Status: Married     Family History: The patient's family history includes Arrhythmia (age of onset: 30) in his brother; Arthritis in his mother; Cancer in his brother; Colon polyps in his mother; Congestive Heart Failure in his brother; Coronary artery disease in his brother and mother; Heart attack in his brother; Heart disease in his mother; Sleep apnea in his brother, father, and mother.  ROS:   Please see the history of present illness.    All other systems reviewed and are negative.  EKGs/Labs/Other Studies Reviewed:    The following studies were reviewed today: Echo 12/01/22: 1. The aortic valve is tricuspid. There is severe calcifcation of the  aortic valve. There is severe thickening of the aortic valve. Aortic valve  regurgitation is not visualized. Moderate aortic valve stenosis. Aortic  valve area, by VTI measures 1.04  cm. Aortic valve mean gradient measures 28.0 mmHg. Aortic valve Vmax  measures 3.21 m/s.   2. Left ventricular ejection fraction, by estimation, is 60 to 65%. The  left ventricle has normal function. The left ventricle has no regional  wall motion abnormalities. Left ventricular diastolic parameters are  consistent with Grade II diastolic  dysfunction (pseudonormalization). The average left ventricular global  longitudinal strain is -21.9 %. The global longitudinal strain is normal.   3. Right ventricular systolic function is normal. The right ventricular  size is mildly enlarged. There is normal pulmonary artery systolic  pressure. The estimated right ventricular systolic pressure is 56.3 mmHg.    4. Left atrial size was mild to moderately dilated.   5. Right atrial size was mildly dilated.   6. The mitral valve is grossly normal. Mild mitral valve regurgitation.  No evidence of mitral stenosis.   7. The inferior vena cava is normal in size with greater than 50%  respiratory variability, suggesting right atrial pressure of 3 mmHg.   Comparison(s): Changes from prior study are noted. AS still is moderate.  EF has improved.    EKG:  EKG is not ordered today.   Recent Labs: 09/21/2022: BUN 20; Creatinine, Ser 1.12; Hemoglobin 12.7; Platelets 154; Potassium 4.3; Sodium 137  Recent Lipid Panel    Component Value Date/Time   CHOL 136 11/02/2022 1039   CHOL 147 05/21/2020 0721   TRIG 45.0 11/02/2022 1039   HDL 68.60 11/02/2022 1039   HDL 66 05/21/2020 0721   CHOLHDL 2 11/02/2022 1039   VLDL 9.0 11/02/2022 1039   LDLCALC 59 11/02/2022 1039   LDLCALC 99 10/30/2021 1557     Risk Assessment/Calculations:    CHA2DS2-VASc Score =     This indicates a  % annual risk of stroke. The patient's score is based upon:                Physical Exam:    VS:  BP 136/70   Pulse 68   Ht '5\' 9"'$  (1.753 m)   Wt 191 lb 6.4 oz (86.8 kg)   SpO2 98%   BMI 28.26 kg/m     Wt Readings from Last 3 Encounters:  12/08/22 191 lb 6.4 oz (86.8 kg)  11/08/22 192 lb 3.2 oz (87.2 kg)  11/02/22 189 lb 4 oz (85.8 kg)     GEN:  Well nourished, well developed in no acute distress HEENT: Normal NECK: No JVD; No carotid bruits LYMPHATICS: No lymphadenopathy CARDIAC: RRR, 2/6 harsh mid peaking systolic murmur at the right upper sternal  border RESPIRATORY:  Clear to auscultation without rales, wheezing or rhonchi  ABDOMEN: Soft, non-tender, non-distended MUSCULOSKELETAL:  No edema; No deformity  SKIN: Warm and dry NEUROLOGIC:  Alert and oriented x 3 PSYCHIATRIC:  Normal affect   ASSESSMENT:    1. Chronic heart failure with preserved ejection fraction ([place])   2. Nonrheumatic aortic valve  stenosis   3. Nonrheumatic mitral valve regurgitation   4. Coronary artery disease involving native coronary artery of native heart without angina pectoris   5. Mixed hyperlipidemia    PLAN:    In order of problems listed above:  LVEF improved with CRT.  NYHA functional class I symptoms at present.  Continue Entresto.  No medication changes recommended.  LVEF 60 to 65% on recent echo. Moderate aortic stenosis by recent echo.  Recommend annual surveillance.  We have discussed natural history and potential need for TAVR at some point in the future.  Currently he is asymptomatic and should be followed with clinical surveillance. Improve mitral regurgitation now just mild after undergoing CRT. Stable after CABG.  Initially presented with inferior STEMI treated with primary PCI.  Had progressive left main and multivessel disease requiring CABG.  Not having any angina at present. Treated with rosuvastatin.  LDL cholesterol 59.  Continue current therapy.           Medication Adjustments/Labs and Tests Ordered: Current medicines are reviewed at length with the patient today.  Concerns regarding medicines are outlined above.  Orders Placed This Encounter  Procedures   ECHOCARDIOGRAM COMPLETE   No orders of the defined types were placed in this encounter.   Patient Instructions  Medication Instructions:  Your physician recommends that you continue on your current medications as directed. Please refer to the Current Medication list given to you today.  *If you need a refill on your cardiac medications before your next appointment, please call your pharmacy*   Lab Work: NONE If you have labs (blood work) drawn today and your tests are completely normal, you will receive your results only by: [place] (if you have MyChart) OR A paper copy in the mail If you have any lab test that is abnormal or we need to change your treatment, we will call you to review the  results.   Testing/Procedures: ECHO (to be done in 1 year prior to appt) Your physician has requested that you have an echocardiogram. Echocardiography is a painless test that uses sound waves to create images of your heart. It provides your doctor with information about the size and shape of your heart and how well your heart's chambers and valves are working. This procedure takes approximately one hour. There are no restrictions for this procedure. Please do NOT wear cologne, perfume, aftershave, or lotions (deodorant is allowed). Please arrive 15 minutes prior to your appointment time.   Follow-Up: At [facility], you and your health needs are our priority.  As part of our continuing mission to provide you with exceptional heart care, we have created designated Provider Care Teams.  These Care Teams include your primary Cardiologist (physician) and Advanced Practice Providers (APPs -  Physician Assistants and Nurse Practitioners) who all work together to provide you with the care you need, when you need it.  Your next appointment:   6 month(s)  The format for your next appointment:   In Person  Provider:   Nicholes Rough, PA-C, Melina Copa, PA-C, Ambrose Pancoast, NP, Ermalinda Barrios, PA-C, Christen Bame, NP, or Richardson Dopp, PA-C  Then, [name], MD will plan to see you again in 1 year(s).      Important Information About Sugar         Signed, [name], MD  12/08/2022 1:46 PM    [place]

## 2022-12-08 NOTE — Patient Instructions (Signed)
Medication Instructions:  Your physician recommends that you continue on your current medications as directed. Please refer to the Current Medication list given to you today.  *If you need a refill on your cardiac medications before your next appointment, please call your pharmacy*   Lab Work: NONE If you have labs (blood work) drawn today and your tests are completely normal, you will receive your results only by: [place] (if you have MyChart) OR A paper copy in the mail If you have any lab test that is abnormal or we need to change your treatment, we will call you to review the results.   Testing/Procedures: ECHO (to be done in 1 year prior to appt) Your physician has requested that you have an echocardiogram. Echocardiography is a painless test that uses sound waves to create images of your heart. It provides your doctor with information about the size and shape of your heart and how well your heart's chambers and valves are working. This procedure takes approximately one hour. There are no restrictions for this procedure. Please do NOT wear cologne, perfume, aftershave, or lotions (deodorant is allowed). Please arrive 15 minutes prior to your appointment time.   Follow-Up: At [facility], you and your health needs are our priority.  As part of our continuing mission to provide you with exceptional heart care, we have created designated Provider Care Teams.  These Care Teams include your primary Cardiologist (physician) and Advanced Practice Providers (APPs -  Physician Assistants and Nurse Practitioners) who all work together to provide you with the care you need, when you need it.  Your next appointment:   6 month(s)  The format for your next appointment:   In Person  Provider:   Nicholes Rough, PA-C, Melina Copa, PA-C, Ambrose Pancoast, NP, Ermalinda Barrios, PA-C, Christen Bame, NP, or Richardson Dopp, PA-C     Then, [name], MD will plan to see you again in 1  year(s).      Important Information About Sugar

## 2022-12-10 ENCOUNTER — Ambulatory Visit: Payer: Medicare Other | Attending: Cardiovascular Disease

## 2022-12-10 DIAGNOSIS — I5022 Chronic systolic (congestive) heart failure: Secondary | ICD-10-CM

## 2022-12-10 LAB — CUP PACEART INCLINIC DEVICE CHECK
Date Time Interrogation Session: 20231215112444
Implantable Lead Connection Status: 753985
Implantable Lead Connection Status: 753985
Implantable Lead Connection Status: 753985
Implantable Lead Implant Date: 20170517
Implantable Lead Implant Date: 20170517
Implantable Lead Implant Date: 20230928
Implantable Lead Location: 753858
Implantable Lead Location: 753859
Implantable Lead Location: 753860
Implantable Lead Model: 4398
Implantable Lead Model: 5076
Implantable Lead Model: 5076
Implantable Pulse Generator Implant Date: 20230928

## 2022-12-10 NOTE — Patient Instructions (Signed)
Follow up as scheduled.  

## 2022-12-10 NOTE — Progress Notes (Signed)
Pt seen in clinic d/t recurrent PNS. Pt presented (2-4) with PNS at low output. Changed to 4-2 with LOC at HIGH output. Changed to 3-can- PNS threshold 1.75V @ 0.38m- loss of cap threshold 0.75V @ 0.665mChanged to 3-4-  PNS threshold 1.5V'@0'$ .6 ms--loss of cap threshold 0.5V @ 0.52m752mrogrammed 3-4 at 1.25V at 0.52ms64mno PNS noted

## 2022-12-13 ENCOUNTER — Encounter: Payer: Self-pay | Admitting: Family Medicine

## 2022-12-24 LAB — CUP PACEART REMOTE DEVICE CHECK
Battery Remaining Longevity: 121 mo
Battery Voltage: 3.18 V
Brady Statistic AP VP Percent: 97.86 %
Brady Statistic AP VS Percent: 0.01 %
Brady Statistic AS VP Percent: 2.12 %
Brady Statistic AS VS Percent: 0.01 %
Brady Statistic RA Percent Paced: 97.78 %
Brady Statistic RV Percent Paced: 99.98 %
Date Time Interrogation Session: 20231228222425
Implantable Lead Connection Status: 753985
Implantable Lead Connection Status: 753985
Implantable Lead Connection Status: 753985
Implantable Lead Implant Date: 20170517
Implantable Lead Implant Date: 20170517
Implantable Lead Implant Date: 20230928
Implantable Lead Location: 753858
Implantable Lead Location: 753859
Implantable Lead Location: 753860
Implantable Lead Model: 4398
Implantable Lead Model: 5076
Implantable Lead Model: 5076
Implantable Pulse Generator Implant Date: 20230928
Lead Channel Impedance Value: 304 Ohm
Lead Channel Impedance Value: 323 Ohm
Lead Channel Impedance Value: 323 Ohm
Lead Channel Impedance Value: 323 Ohm
Lead Channel Impedance Value: 342 Ohm
Lead Channel Impedance Value: 361 Ohm
Lead Channel Impedance Value: 399 Ohm
Lead Channel Impedance Value: 399 Ohm
Lead Channel Impedance Value: 513 Ohm
Lead Channel Impedance Value: 532 Ohm
Lead Channel Impedance Value: 551 Ohm
Lead Channel Impedance Value: 551 Ohm
Lead Channel Impedance Value: 551 Ohm
Lead Channel Impedance Value: 570 Ohm
Lead Channel Pacing Threshold Amplitude: 0.625 V
Lead Channel Pacing Threshold Amplitude: 0.875 V
Lead Channel Pacing Threshold Amplitude: 0.875 V
Lead Channel Pacing Threshold Pulse Width: 0.4 ms
Lead Channel Pacing Threshold Pulse Width: 0.4 ms
Lead Channel Pacing Threshold Pulse Width: 0.6 ms
Lead Channel Sensing Intrinsic Amplitude: 1.125 mV
Lead Channel Sensing Intrinsic Amplitude: 1.125 mV
Lead Channel Setting Pacing Amplitude: 1.5 V
Lead Channel Setting Pacing Amplitude: 1.5 V
Lead Channel Setting Pacing Amplitude: 2 V
Lead Channel Setting Pacing Pulse Width: 0.4 ms
Lead Channel Setting Pacing Pulse Width: 0.6 ms
Lead Channel Setting Sensing Sensitivity: 1.2 mV
Zone Setting Status: 755011
Zone Setting Status: 755011

## 2022-12-29 ENCOUNTER — Ambulatory Visit: Payer: Medicare Other | Attending: Cardiology | Admitting: Cardiology

## 2022-12-29 ENCOUNTER — Encounter: Payer: Self-pay | Admitting: Cardiology

## 2022-12-29 VITALS — BP 126/86 | HR 82 | Ht 69.0 in | Wt 191.2 lb

## 2022-12-29 DIAGNOSIS — D6869 Other thrombophilia: Secondary | ICD-10-CM | POA: Diagnosis not present

## 2022-12-29 DIAGNOSIS — I480 Paroxysmal atrial fibrillation: Secondary | ICD-10-CM | POA: Insufficient documentation

## 2022-12-29 DIAGNOSIS — I350 Nonrheumatic aortic (valve) stenosis: Secondary | ICD-10-CM | POA: Insufficient documentation

## 2022-12-29 DIAGNOSIS — I2510 Atherosclerotic heart disease of native coronary artery without angina pectoris: Secondary | ICD-10-CM | POA: Insufficient documentation

## 2022-12-29 DIAGNOSIS — I442 Atrioventricular block, complete: Secondary | ICD-10-CM | POA: Diagnosis not present

## 2022-12-29 NOTE — Patient Instructions (Signed)
Medication Instructions:  Your physician recommends that you continue on your current medications as directed. Please refer to the Current Medication list given to you today.  *If you need a refill on your cardiac medications before your next appointment, please call your pharmacy*   Lab Work: None ordered   Testing/Procedures: None ordered   Follow-Up: At [facility], you and your health needs are our priority.  As part of our continuing mission to provide you with exceptional heart care, we have created designated Provider Care Teams.  These Care Teams include your primary Cardiologist (physician) and Advanced Practice Providers (APPs -  Physician Assistants and Nurse Practitioners) who all work together to provide you with the care you need, when you need it.  Remote monitoring is used to monitor your Pacemaker or ICD from home. This monitoring reduces the number of office visits required to check your device to one time per year. It allows [place] to keep an eye on the functioning of your device to ensure it is working properly. You are scheduled for a device check from home on 03/21/2023. You may send your transmission at any time that day. If you have a wireless device, the transmission will be sent automatically. After your physician reviews your transmission, you will receive a postcard with your next transmission date.  Your next appointment:   1 year(s)  The format for your next appointment:   In Person  Provider:   Allegra Lai, MD    Thank you for choosing [facility]!!   Trinidad Curet, RN [phone]    Other Instructions  Important Information About Sugar

## 2022-12-29 NOTE — Progress Notes (Signed)
Electrophysiology Office Note   Date:  12/29/2022   ID:  Derrick, [name] [birth year], MRN [id number]  PCP:  Derrick Sanders, MD  Cardiologist:  Derrick [name] Primary Electrophysiologist:  Derrick Kassa [name], MD    No chief complaint on file.     History of Present Illness: Derrick [name] is a 82 y.o. male who presents today for electrophysiology evaluation.     He has a history of significant coronary artery disease post RCA stent, CABG, PE, pulmonary sarcoidosis, MDS.  He has sick sinus syndrome and is post Medtronic dual-chamber pacemaker.  He has developed complete heart block.  He is on Multaq for his atrial fibrillation.  He is now status post biventricular upgrade 09/23/2022.  Today, denies symptoms of palpitations, chest pain, shortness of breath, orthopnea, PND, lower extremity edema, claudication, dizziness, presyncope, syncope, bleeding, or neurologic sequela. The patient is tolerating medications without difficulties.  Since being seen he has done well.  He has noted no further episodes of fatigue.  His wife states that he is able to do more with exercise and exertion than prior to his device upgrade.  He feels like he has more energy and is less short of breath.    Past Medical History:  Diagnosis Date   Anemia    iron deficiency anemia- infusion on 02/15/2020 and 02/22/2020   BENIGN PROSTATIC HYPERTROPHY, WITH OBSTRUCTION 05/28/2010   CAD (coronary artery disease)    a. s/p NSTEMI 06/01/11: DES to RCA;  b. cath 06/25/11:   dLM 50-60% (FFR 0.87), prox to mid LAD 40-50%, D1 50%, pCFX 50%, RCA stent ok, dPDA 80%, EF 55-60%.  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   DISH (diffuse idiopathic skeletal hyperostosis) 12/02/2015   Diverticulitis    GENERALIZED OSTEOARTHROSIS UNSPECIFIED SITE 05/28/2010   GERD 05/28/2010   Heart murmur    History of gout    History of kidney stones    HYPERLIPIDEMIA 05/28/2010   Hypertension    IgM lambda paraproteinemia    Intrinsic asthma, 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   "associated w/lung sarcoidosis"   Malignant neoplasm of descending colon ([place]) 05/28/2010   MITRAL VALVE PROLAPSE 05/28/2010   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   NSTEMI (non-ST elevated myocardial infarction) ([place]) 06/03/11   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   OSA on CPAP 05/28/2010   PALPITATIONS, CHRONIC 05/28/2010   Parotid gland pain 2010   infection   Peripheral neuropathy    "tx'd w/targeted chemo" (05/12/2016)   Pneumonia 2-3 times   Presence of permanent cardiac pacemaker    Medtronic   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   Sarcoidosis    pulmonalis   UNSPECIFIED INFLAMMATORY AND TOXIC NEUROPATHY 05/28/2010   Vision loss    VITAMIN D DEFICIENCY 05/28/2010   Past Surgical History:  Procedure Laterality Date   APPENDECTOMY  1954   age 38   BIV UPGRADE N/A 09/23/2022   Procedure: BIV PACEMAKER UPGRADE;  Surgeon: [name], MD;  Location:  [facility];  Service: Cardiovascular;  Laterality: N/A;   CATARACT EXTRACTION W/ INTRAOCULAR LENS IMPLANT Left    COLON SURGERY  2000   decending colon    CORONARY ANGIOPLASTY WITH STENT PLACEMENT  06/03/11   CORONARY ARTERY BYPASS GRAFT N/A 06/05/2019   Procedure: CORONARY ARTERY BYPASS GRAFTING (CABG) TIMES THREE USING LEFT INTERNAL MAMMARY ARTERY AND RIGHT GREATER SEPHANOUS VEIN;  Surgeon: [name], MD;  Location: [facility];  Service: Open Heart Surgery;  Laterality: N/A;   ELECTROPHYSIOLOGIC STUDY N/A  05/12/2016   Procedure: Cardioversion;  Surgeon: [name], MD;  Location: [facility];  Service: Cardiovascular;  Laterality: N/A;   EP IMPLANTABLE DEVICE N/A 05/12/2016   Procedure: Pacemaker Implant;  Surgeon: [name], MD;  Location: [facility];  Service: Cardiovascular;  Laterality: N/A;   EYE SURGERY     bilateral cataract with lens implant   HERNIA REPAIR  01/2019   left inguinal hernia repair   INGUINAL HERNIA REPAIR Left 02/21/2019   Procedure: LEFT INGUINAL HERNIA REPAIR WITH MESH;  Surgeon: [name], MD;  Location: [facility];  Service: General;  Laterality: Left;   INSERT / REPLACE / REMOVE PACEMAKER  05/12/2016   INTRAVASCULAR PRESSURE WIRE/FFR STUDY N/A 05/15/2019   Procedure: INTRAVASCULAR PRESSURE WIRE/FFR STUDY;  Surgeon: [name], MD;  Location: [facility];  Service: Cardiovascular;  Laterality: N/A;   KNEE ARTHROSCOPY Right 2003   LEFT HEART CATH AND CORONARY ANGIOGRAPHY N/A 05/15/2019   Procedure: LEFT HEART CATH AND CORONARY ANGIOGRAPHY;  Surgeon: [name], MD;  Location: [facility];  Service: Cardiovascular;  Laterality: N/A;   LEFT HEART CATHETERIZATION WITH CORONARY ANGIOGRAM N/A 03/31/2015   Procedure: LEFT HEART CATHETERIZATION WITH CORONARY ANGIOGRAM;  Surgeon: [name], MD;  Location: [facility];  Service: Cardiovascular;  Laterality: N/A;   LUNG SURGERY  2001   "open lung dissection"   MOHS SURGERY Left ~ 2008   ear   PROSTATE BIOPSY  2010   TEE WITHOUT CARDIOVERSION N/A 06/05/2019   Procedure: TRANSESOPHAGEAL ECHOCARDIOGRAM (TEE);  Surgeon: [name], [name], MD;  Location: [facility];  Service: Open Heart Surgery;  Laterality: N/A;   TONSILLECTOMY  1950   TOTAL KNEE ARTHROPLASTY Right 03/11/2020   Procedure: TOTAL KNEE ARTHROPLASTY;  Surgeon: [name], MD;  Location: [facility];  Service: Orthopedics;  Laterality: Right;  70 mins     Current Outpatient Medications  Medication Sig Dispense Refill    albuterol (VENTOLIN HFA) 108 (90 Base) MCG/ACT inhaler Inhale 2 puffs into the lungs every 6 (six) hours as needed for wheezing or shortness of breath. 8 g 2   amoxicillin (AMOXIL) 500 MG capsule TAKE 2 TABLETS BY MOUTH 1 HOUR PRIOR TO DENTAL APPOINTMENT AND 2 TABLETS IMMEDIATELY FOLLOWING DENTAL APPOINTMENT     apixaban (ELIQUIS) 5 MG TABS tablet Take 1 tablet (5 mg total) by mouth 2 (two) times daily. 180 tablet 1   bimatoprost (LUMIGAN) 0.01 % SOLN Place 1 drop into both eyes at bedtime.     Cholecalciferol 25 MCG (1000 UT) tablet Take 1,000 Units by mouth daily.      diclofenac Sodium (VOLTAREN) 1 % GEL Apply 2 g topically 4 (four) times daily as needed (pain).     dorzolamide-timolol (COSOPT) 22.3-6.8 MG/ML ophthalmic solution Place 1 drop into both eyes 2 (two) times daily.      dronedarone (MULTAQ) 400 MG tablet Take 1 tablet (400 mg total) by mouth 2 (  two) times daily with a meal. 180 tablet 3   finasteride (PROSCAR) 5 MG tablet Take 5 mg by mouth daily after breakfast.      fluticasone (FLONASE) 50 MCG/ACT nasal spray Place 2 sprays into both nostrils 2 (two) times daily.     Fluticasone Propionate, Inhal, (FLOVENT DISKUS) 100 MCG/ACT AEPB Inhale 1 puff into the lungs 2 (two) times daily as needed. 28 each 2   Multiple Vitamin (MULTIVITAMIN WITH MINERALS) TABS tablet Take 1 tablet by mouth daily.     pantoprazole (PROTONIX) 40 MG tablet Take 1 tablet by mouth once daily 90 tablet 3   Polyethyl Glycol-Propyl Glycol (SYSTANE OP) Place 1 drop into both eyes daily as needed (dry eyes).     rosuvastatin (CRESTOR) 20 MG tablet Take 1 tablet by mouth once daily 90 tablet 3   sacubitril-valsartan (ENTRESTO) 24-26 MG Take 1 tablet by mouth 2 (two) times daily. 180 tablet 3   tadalafil (CIALIS) 5 MG tablet Take 5 mg by mouth at bedtime.      triamterene-hydrochlorothiazide (MAXZIDE-25) 37.5-25 MG tablet Take 1 tablet by mouth once daily 90 tablet 3   No current facility-administered medications  for this visit.    Allergies:   Nsaids   Social History:  The patient  reports that he has never smoked. He has never used smokeless tobacco. He reports current alcohol use. He reports that he does not use drugs.   Family History:  The patient's family history includes Arrhythmia (age of onset: 75) in his brother; Arthritis in his mother; Cancer in his brother; Colon polyps in his mother; Congestive Heart Failure in his brother; Coronary artery disease in his brother and mother; Heart attack in his brother; Heart disease in his mother; Sleep apnea in his brother, father, and mother.   ROS:  Please see the history of present illness.   Otherwise, review of systems is positive for none.   All other systems are reviewed and negative.   PHYSICAL EXAM: VS:  BP 126/86   Pulse 82   Ht '5\' 9"'$  (1.753 m)   Wt 191 lb 3.2 oz (86.7 kg)   SpO2 99%   BMI 28.24 kg/m  , BMI Body mass index is 28.24 kg/m. GEN: Well nourished, well developed, in no acute distress  HEENT: normal  Neck: no JVD, carotid bruits, or masses Cardiac: RRR; 2/6 systolic murmur at the base, no rubs, or gallops,no edema  Respiratory:  clear to auscultation bilaterally, normal work of breathing GI: soft, nontender, nondistended, + BS MS: no deformity or atrophy  Skin: warm and dry, device site well healed Neuro:  Strength and sensation are intact Psych: euthymic mood, full affect  EKG:  EKG is ordered today. Personal review of the ekg ordered shows AV paced  Personal review of the device interrogation today. Results in [facility]: 09/21/2022: BUN 20; Creatinine, Ser 1.12; Hemoglobin 12.7; Platelets 154; Potassium 4.3; Sodium 137    Lipid Panel     Component Value Date/Time   CHOL 136 11/02/2022 1039   CHOL 147 05/21/2020 0721   TRIG 45.0 11/02/2022 1039   HDL 68.60 11/02/2022 1039   HDL 66 05/21/2020 0721   CHOLHDL 2 11/02/2022 1039   VLDL 9.0 11/02/2022 1039   LDLCALC 59 11/02/2022 1039   LDLCALC 99  10/30/2021 1557     Wt Readings from Last 3 Encounters:  12/29/22 191 lb 3.2 oz (86.7 kg)  12/08/22 191 lb 6.4 oz (86.8 kg)  11/08/22  192 lb 3.2 oz (87.2 kg)      Other studies Reviewed: Additional studies/ records that were reviewed today include: TTE 12/01/22 Review of the above records today demonstrates:   1. The aortic valve is tricuspid. There is severe calcifcation of the  aortic valve. There is severe thickening of the aortic valve. Aortic valve  regurgitation is not visualized. Moderate aortic valve stenosis. Aortic  valve area, by VTI measures 1.04  cm. Aortic valve mean gradient measures 28.0 mmHg. Aortic valve Vmax  measures 3.21 m/s.   2. Left ventricular ejection fraction, by estimation, is 60 to 65%. The  left ventricle has normal function. The left ventricle has no regional  wall motion abnormalities. Left ventricular diastolic parameters are  consistent with Grade II diastolic  dysfunction (pseudonormalization). The average left ventricular global  longitudinal strain is -21.9 %. The global longitudinal strain is normal.   3. Right ventricular systolic function is normal. The right ventricular  size is mildly enlarged. There is normal pulmonary artery systolic  pressure. The estimated right ventricular systolic pressure is 26.3 mmHg.   4. Left atrial size was mild to moderately dilated.   5. Right atrial size was mildly dilated.   6. The mitral valve is grossly normal. Mild mitral valve regurgitation.  No evidence of mitral stenosis.   7. The inferior vena cava is normal in size with greater than 50%  respiratory variability, suggesting right atrial pressure of 3 mmHg.    ASSESSMENT AND PLAN:  1.  Paroxysmal atrial fibrillation: Currently on Eliquis 5 mg twice daily, Multaq 400 mg twice daily.  CHA2DS2-VASc of 3.  Minimal episodes.  Continue with current management.  2.  Hypertension: Currently well-controlled  3.  Coronary disease: Status post CABG.  No  current chest pain.  4.  Sick sinus syndrome/complete heart block: Status post Medtronic dual-chamber pacemaker implanted 05/12/2016.  He is now status post device upgrade on 09/23/2022.  Device function appropriate.  No changes.  5.  Secondary hypercoagulable state: Currently on Eliquis for atrial fibrillation as above  6.  Nonsustained VT: Noted on device interrogation.  Short episodes and asymptomatic.  7.  Moderate aortic stenosis: No change on recent echo   Current medicines are reviewed at length with the patient today.   The patient does not have concerns regarding his medicines.  The following changes were made today: None  Labs/ tests ordered today include:  Orders Placed This Encounter  Procedures   EKG 12-Lead      Disposition:   FU 12 months  Signed, Rahcel Shutes [name], MD  12/29/2022 4:33 PM     [address] [place] [address] [phone] (office) [phone] (fax)

## 2023-03-21 ENCOUNTER — Other Ambulatory Visit: Payer: Self-pay | Admitting: *Deleted

## 2023-03-21 DIAGNOSIS — I2510 Atherosclerotic heart disease of native coronary artery without angina pectoris: Secondary | ICD-10-CM

## 2023-03-21 MED ORDER — SACUBITRIL-VALSARTAN 24-26 MG PO TABS: 1.0000 | ORAL_TABLET | Freq: Two times a day (BID) | ORAL | 3 refills | Status: DC

## 2023-03-25 ENCOUNTER — Ambulatory Visit (INDEPENDENT_AMBULATORY_CARE_PROVIDER_SITE_OTHER): Payer: Medicare Other

## 2023-03-25 DIAGNOSIS — I442 Atrioventricular block, complete: Secondary | ICD-10-CM | POA: Diagnosis not present

## 2023-03-25 LAB — CUP PACEART REMOTE DEVICE CHECK
Battery Remaining Longevity: 117 mo
Battery Voltage: 3.12 V
Brady Statistic AP VP Percent: 99.03 %
Brady Statistic AP VS Percent: 0.01 %
Brady Statistic AS VP Percent: 0.95 %
Brady Statistic AS VS Percent: 0 %
Brady Statistic RA Percent Paced: 95.49 %
Brady Statistic RV Percent Paced: 99.98 %
Date Time Interrogation Session: 20240328195814
Implantable Lead Connection Status: 753985
Implantable Lead Connection Status: 753985
Implantable Lead Connection Status: 753985
Implantable Lead Implant Date: 20170517
Implantable Lead Implant Date: 20170517
Implantable Lead Implant Date: 20230928
Implantable Lead Location: 753858
Implantable Lead Location: 753859
Implantable Lead Location: 753860
Implantable Lead Model: 4398
Implantable Lead Model: 5076
Implantable Lead Model: 5076
Implantable Pulse Generator Implant Date: 20230928
Lead Channel Impedance Value: 304 Ohm
Lead Channel Impedance Value: 323 Ohm
Lead Channel Impedance Value: 323 Ohm
Lead Channel Impedance Value: 323 Ohm
Lead Channel Impedance Value: 361 Ohm
Lead Channel Impedance Value: 361 Ohm
Lead Channel Impedance Value: 380 Ohm
Lead Channel Impedance Value: 380 Ohm
Lead Channel Impedance Value: 532 Ohm
Lead Channel Impedance Value: 532 Ohm
Lead Channel Impedance Value: 532 Ohm
Lead Channel Impedance Value: 570 Ohm
Lead Channel Impedance Value: 589 Ohm
Lead Channel Impedance Value: 589 Ohm
Lead Channel Pacing Threshold Amplitude: 0.625 V
Lead Channel Pacing Threshold Amplitude: 0.75 V
Lead Channel Pacing Threshold Amplitude: 0.875 V
Lead Channel Pacing Threshold Pulse Width: 0.4 ms
Lead Channel Pacing Threshold Pulse Width: 0.4 ms
Lead Channel Pacing Threshold Pulse Width: 0.6 ms
Lead Channel Sensing Intrinsic Amplitude: 1.25 mV
Lead Channel Sensing Intrinsic Amplitude: 1.25 mV
Lead Channel Setting Pacing Amplitude: 1.5 V
Lead Channel Setting Pacing Amplitude: 1.5 V
Lead Channel Setting Pacing Amplitude: 2 V
Lead Channel Setting Pacing Pulse Width: 0.4 ms
Lead Channel Setting Pacing Pulse Width: 0.6 ms
Lead Channel Setting Sensing Sensitivity: 1.2 mV
Zone Setting Status: 755011
Zone Setting Status: 755011

## 2023-04-04 DIAGNOSIS — Z23 Encounter for immunization: Secondary | ICD-10-CM | POA: Diagnosis not present

## 2023-04-07 ENCOUNTER — Encounter: Payer: Self-pay | Admitting: Cardiovascular Disease

## 2023-04-11 ENCOUNTER — Other Ambulatory Visit: Payer: Self-pay | Admitting: Cardiovascular Disease

## 2023-04-11 DIAGNOSIS — I4891 Unspecified atrial fibrillation: Secondary | ICD-10-CM

## 2023-04-13 ENCOUNTER — Ambulatory Visit: Payer: Medicare Other | Admitting: Nurse Practitioner

## 2023-04-20 NOTE — Progress Notes (Unsigned)
Office Visit    Patient Name: Derrick [name] Date of Encounter: 04/21/2023  Primary Care Provider:  Excell Seltzer, MD Primary Cardiologist:  Tonny Bollman, MD Primary Electrophysiologist: Will Jorja Loa, MD   Past Medical History    Past Medical History:  Diagnosis Date   Anemia    iron deficiency anemia- infusion on 02/15/2020 and 02/22/2020   BENIGN PROSTATIC HYPERTROPHY, WITH OBSTRUCTION 05/28/2010   CAD (coronary artery disease)    a. s/p NSTEMI 06/01/11: DES to RCA;  b. cath 06/25/11:   dLM 50-60% (FFR 0.87), prox to mid LAD 40-50%, D1 50%, pCFX 50%, RCA stent ok, dPDA 80%, EF 55-60%.  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   DISH (diffuse idiopathic skeletal hyperostosis) 12/02/2015   Diverticulitis    GENERALIZED OSTEOARTHROSIS UNSPECIFIED SITE 05/28/2010   GERD 05/28/2010   Heart murmur    History of gout    History of kidney stones    HYPERLIPIDEMIA 05/28/2010   Hypertension    IgM lambda paraproteinemia    Intrinsic asthma, 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   "associated w/lung sarcoidosis"   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   MITRAL VALVE PROLAPSE 05/28/2010   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   NSTEMI (non-ST elevated myocardial infarction) ([name]) 06/03/11   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   OSA on CPAP 05/28/2010   PALPITATIONS, CHRONIC 05/28/2010   Parotid gland pain 2010   infection   Peripheral neuropathy    "tx'd w/targeted chemo"  (05/12/2016)   Pneumonia 2-3 times   Presence of permanent cardiac pacemaker    Medtronic   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   Sarcoidosis    pulmonalis   UNSPECIFIED INFLAMMATORY AND TOXIC NEUROPATHY 05/28/2010   Vision loss    VITAMIN D DEFICIENCY 05/28/2010   Past Surgical History:  Procedure Laterality Date   APPENDECTOMY  1954   age 85   BIV UPGRADE N/A 09/23/2022   Procedure: BIV PACEMAKER UPGRADE;  Surgeon: Regan Lemming, MD;  Location: [facility] INVASIVE CV LAB;  Service: Cardiovascular;  Laterality: N/A;   CATARACT EXTRACTION W/ INTRAOCULAR LENS IMPLANT Left    COLON SURGERY  2000   decending colon    CORONARY ANGIOPLASTY WITH STENT PLACEMENT  06/03/11   CORONARY ARTERY BYPASS GRAFT N/A 06/05/2019   Procedure: CORONARY ARTERY BYPASS GRAFTING (CABG) TIMES THREE USING LEFT INTERNAL MAMMARY ARTERY AND RIGHT GREATER SEPHANOUS VEIN;  Surgeon: Kerin Perna, MD;  Location: [facility] OR;  Service: Open Heart Surgery;  Laterality: N/A;   CORONARY PRESSURE/FFR STUDY N/A 05/15/2019   Procedure: INTRAVASCULAR PRESSURE WIRE/FFR STUDY;  Surgeon: Tonny Bollman, MD;  Location: [facility] INVASIVE CV LAB;  Service: Cardiovascular;  Laterality: N/A;   ELECTROPHYSIOLOGIC STUDY N/A 05/12/2016   Procedure: Cardioversion;  Surgeon: Will Jorja Loa, MD;  Location: [facility] INVASIVE CV LAB;  Service: Cardiovascular;  Laterality: N/A;   EP IMPLANTABLE DEVICE N/A 05/12/2016   Procedure: Pacemaker Implant;  Surgeon: Will Jorja Loa, MD;  Location: [facility] INVASIVE  CV LAB;  Service: Cardiovascular;  Laterality: N/A;   EYE SURGERY     bilateral cataract with lens implant   HERNIA REPAIR  01/2019   left inguinal hernia repair   INGUINAL HERNIA REPAIR Left 02/21/2019   Procedure: LEFT INGUINAL HERNIA REPAIR WITH MESH;  Surgeon: Harriette Bouillon, MD;  Location: [facility] OR;  Service: General;  Laterality: Left;   INSERT / REPLACE / REMOVE PACEMAKER  05/12/2016   KNEE ARTHROSCOPY Right 2003   LEFT HEART CATH AND CORONARY ANGIOGRAPHY N/A  05/15/2019   Procedure: LEFT HEART CATH AND CORONARY ANGIOGRAPHY;  Surgeon: Tonny Bollman, MD;  Location[facility] INVASIVE CV LAB;  Service: Cardiovascular;  Laterality: N/A;   LEFT HEART CATHETERIZATION WITH CORONARY ANGIOGRAM N/A 03/31/2015   Procedure: LEFT HEART CATHETERIZATION WITH CORONARY ANGIOGRAM;  Surgeon: Tonny Bollman, MD;  Location: [facility] CATH LAB;  Service: Cardiovascular;  Laterality: N/A;   LUNG SURGERY  2001   "open lung dissection"   MOHS SURGERY Left ~ 2008   ear   PROSTATE BIOPSY  2010   TEE WITHOUT CARDIOVERSION N/A 06/05/2019   Procedure: TRANSESOPHAGEAL ECHOCARDIOGRAM (TEE);  Surgeon: Donata Clay, Theron Arista, MD;  Location: [facility] OR;  Service: Open Heart Surgery;  Laterality: N/A;   TONSILLECTOMY  1950   TOTAL KNEE ARTHROPLASTY Right 03/11/2020   Procedure: TOTAL KNEE ARTHROPLASTY;  Surgeon: Durene Romans, MD;  Location: [facility] ORS;  Service: Orthopedics;  Laterality: Right;  70 mins    Allergies  Allergies  Allergen Reactions   Nsaids     Contraindicated due to being on Eliquis.     History of Present Illness    Derrick [name]  is a 82 year old male with a PMH of CAD s/p inferior MI in 2012 treated with DES to RCA, s/p CABG 2020, PAF, SSS s/p PPM with upgrade to CRT P 2023, HTN, HLD, NSVT, pulmonary sarcoid, MGUS, colon cancer, chronic inflammatory demyelinating polyneuropathy who presents today for left upper back pain.  He was last seen by Dr. Excell Seltzer on 12/08/2022 for follow-up of CAD.  During visit patient reported doing well with no exertional symptoms or chest pain.  He underwent CRT-P upgrade on 08/2022 and EF increased from 40-45% to 60 to 65%.  He is currently on GDMT with Entresto, and is on Multaq and Eliquis for atrial fibrillation.  He contacted our office on 04/07/2023 with complaint of sharp left upper back pain between shoulder blades.  Tried Tylenol with some easing of pain.   Derrick [name] presents today for complaint of sharp pain in the left upper back for 2 to 3 weeks.   Since last being seen in the office patient reports that he has been experiencing sharp discomfort in his left scapular area that has also traveled around to his subaxillary area.  He denies any shooting pain but does describe pain as dull and aching.  He reports no precipitating factors that agitate or alleviate the pain.  His blood pressure today is well-controlled at 122/68 and heart rate is 69 bpm.  He has been compliant with his medications and denies any adverse reactions.  He does also report ongoing weakness and fatigue that has been occurring when walking with his wife.  He has not required any nitroglycerin for this discomfort.  He is concerned because this discomfort was similar to his anginal equivalent when undergoing bypass 2 years ago.  Patient denies  palpitations, dyspnea, PND, orthopnea, nausea, vomiting, dizziness, syncope, edema, weight gain, or early satiety.   Home Medications  Current Outpatient Medications  Medication Sig Dispense Refill   albuterol (VENTOLIN HFA) 108 (90 Base) MCG/ACT inhaler Inhale 2 puffs into the lungs every 6 (six) hours as needed for wheezing or shortness of breath. 8 g 2   amoxicillin (AMOXIL) 500 MG capsule TAKE 2 TABLETS BY MOUTH 1 HOUR PRIOR TO DENTAL APPOINTMENT AND 2 TABLETS IMMEDIATELY FOLLOWING DENTAL APPOINTMENT     bimatoprost (LUMIGAN) 0.01 % SOLN Place 1 drop into both eyes at bedtime.     Cholecalciferol 25 MCG (1000 UT) tablet Take 1,000 Units by mouth daily.      diclofenac Sodium (VOLTAREN) 1 % GEL Apply 2 g topically 4 (four) times daily as needed (pain).     dorzolamide-timolol (COSOPT) 22.3-6.8 MG/ML ophthalmic solution Place 1 drop into both eyes 2 (two) times daily.      dronedarone (MULTAQ) 400 MG tablet Take 1 tablet (400 mg total) by mouth 2 (two) times daily with a meal. 180 tablet 3   finasteride (PROSCAR) 5 MG tablet Take 5 mg by mouth daily after breakfast.      fluticasone (FLONASE) 50 MCG/ACT nasal spray Place 2 sprays into  both nostrils 2 (two) times daily.     Fluticasone Propionate, Inhal, (FLOVENT DISKUS) 100 MCG/ACT AEPB Inhale 1 puff into the lungs 2 (two) times daily as needed. 28 each 2   isosorbide mononitrate (IMDUR) 30 MG 24 hr tablet Take 1 tablet (30 mg total) by mouth daily. 30 tablet 3   Multiple Vitamin (MULTIVITAMIN WITH MINERALS) TABS tablet Take 1 tablet by mouth daily.     pantoprazole (PROTONIX) 40 MG tablet Take 1 tablet by mouth once daily 90 tablet 2   Polyethyl Glycol-Propyl Glycol (SYSTANE OP) Place 1 drop into both eyes daily as needed (dry eyes).     rosuvastatin (CRESTOR) 20 MG tablet Take 1 tablet by mouth once daily 90 tablet 2   sacubitril-valsartan (ENTRESTO) 24-26 MG Take 1 tablet by mouth 2 (two) times daily. 180 tablet 3   tadalafil (CIALIS) 5 MG tablet Take 5 mg by mouth at bedtime.      triamterene-hydrochlorothiazide (MAXZIDE-25) 37.5-25 MG tablet Take 1 tablet by mouth once daily 90 tablet 3   apixaban (ELIQUIS) 5 MG TABS tablet Take 1 tablet (5 mg total) by mouth 2 (two) times daily. 180 tablet 1   No current facility-administered medications for this visit.     Review of Systems  Please see the history of present illness.    (+) Back pain (+) Subaxillary discomfort none completed today  All other systems reviewed and are otherwise negative except as noted above.  Physical Exam    Wt Readings from Last 3 Encounters:  04/21/23 195 lb 3.2 oz (88.5 kg)  12/29/22 191 lb 3.2 oz (86.7 kg)  12/08/22 191 lb 6.4 oz (86.8 kg)   VS: Vitals:   04/21/23 1434  BP: 122/68  Pulse: 69  SpO2: 97%  ,Body mass index is 28.83 kg/m.  Constitutional:      Appearance: Healthy appearance. Not in distress.  Neck:     Vascular: JVD normal.  Pulmonary:     Effort: Pulmonary effort is normal.     Breath sounds: No wheezing. No rales. Diminished in the bases Cardiovascular:     Normal rate. Regular rhythm. Normal S1. Normal S2.      Murmurs: 3/6 RUSB systolic murmur Edema:     Peripheral edema absent.  Abdominal:     Palpations: Abdomen is soft non tender. There is  no hepatomegaly.  Skin:    General: Skin is warm and dry.  Neurological:     General: No focal deficit present.     Mental Status: Alert and oriented to person, place and time.     Cranial Nerves: Cranial nerves are intact.  EKG/LABS/ Recent Cardiac Studies    ECG personally reviewed by me today -none completed today  Cardiac Studies & Procedures   CARDIAC CATHETERIZATION  CARDIAC CATHETERIZATION 05/15/2019  Narrative  Previously placed Prox RCA to Mid RCA stent (unknown type) is widely patent.  Dist RCA lesion is 50% stenosed.  Mid LM to Ost LAD lesion is 70% stenosed.  Prox LAD lesion is 60% stenosed.  1. Severe distal left main stenosis, hemodynamically significant by DFR analysis 2. Moderate proximal LAD stenosis 3. Continued patency of the RCA stents 4. Nonobstructive LCx stenosis 5. Mild aortic stenosis with peak-to-peak gradient < 10 mmHg by pullback  Recommend: cardiac surgical consultation for consideration of CABG in this patient with symptomatic left main disease. Last echo 09/2018 should be updated to reevaluate severity of aortic stenosis  Findings Coronary Findings Diagnostic  Dominance: Right  Left Main The left main has 60 to 70% distal vessel stenosis with heavy calcification. DFR is performed and the resting DFR of the left main is 0.84 (ischemic threshold is 0.89). Mid LM to Ost LAD lesion is 70% stenosed. The lesion is eccentric. The lesion is calcified.  Left Anterior Descending The LAD is diffusely diseased.  The proximal vessel has moderate calcific stenosis. Prox LAD lesion is 60% stenosed. The lesion is calcified.  Right Coronary Artery Previously placed Prox RCA to Mid RCA stent (unknown type) is widely patent. The RCA stents are widely patent with no significant in-stent restenosis Dist RCA lesion is 50% stenosed.  Intervention  No interventions  have been documented.   STRESS TESTS  MYOCARDIAL PERFUSION IMAGING 05/29/2018  Narrative  Nuclear stress EF: 51%.  This is a low risk study.  The left ventricular ejection fraction is mildly decreased (45-54%).  1. EF 51% with apical hypokinesis. 2. Medium-sized, moderate intensity basal to apical inferior and inferolateral perfusion defect.  This looks like prior infarction with no ischemia, though wall motion is normal in the inferior and lateral walls. .  Overall, I think this is a low risk study with no evidence for ischemia.   ECHOCARDIOGRAM  ECHOCARDIOGRAM COMPLETE 12/01/2022  Narrative ECHOCARDIOGRAM REPORT    Patient Name:   ARSLAN KIER Date of Exam: 12/01/2022 Medical Rec #:  [id number]         Height:       69.0 in Accession #:    [id number]        Weight:       192.2 lb Date of Birth:  1941-07-03          BSA:          2.031 m Patient Age:    81 years          BP:           119/68 mmHg Patient Gender: M                 HR:           60 bpm. 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 CAD, COPD, Arrythmias:Atrial Fibrillation and NSVT; Signs/Symptoms:Murmur.  Sonographer:     Clearence Ped RCS Referring Phys:  [phone] [name] Diagnosing Phys: Lennie Odor MD  IMPRESSIONS   1. The aortic valve is tricuspid. There is severe calcifcation of the aortic valve. There is severe thickening of the aortic valve. Aortic valve regurgitation is not visualized. Moderate aortic valve stenosis. Aortic valve area, by VTI measures 1.04 cm. Aortic valve mean gradient measures 28.0 mmHg. Aortic valve Vmax measures 3.21 m/s. 2. Left ventricular ejection fraction, by estimation, is 60 to 65%. The left ventricle has normal function. The left ventricle has no regional wall  motion abnormalities. Left ventricular diastolic parameters are consistent with Grade II diastolic dysfunction (pseudonormalization). The average left ventricular global longitudinal strain is -21.9 %. The global longitudinal strain is normal. 3. Right ventricular systolic function is normal. The right ventricular size is mildly enlarged. There is normal pulmonary artery systolic pressure. The estimated right ventricular systolic pressure is 26.0 mmHg. 4. Left atrial size was mild to moderately dilated. 5. Right atrial size was mildly dilated. 6. The mitral valve is grossly normal. Mild mitral valve regurgitation. No evidence of mitral stenosis. 7. The inferior vena cava is normal in size with greater than 50% respiratory variability, suggesting right atrial pressure of 3 mmHg.  Comparison(s): Changes from prior study are noted. AS still is moderate. EF has improved.  FINDINGS Left Ventricle: Left ventricular ejection fraction, by estimation, is 60 to 65%. The left ventricle has normal function. The left ventricle has no regional wall motion abnormalities. The average left ventricular global longitudinal strain is -21.9 %. The global longitudinal strain is normal. The left ventricular internal cavity size was normal in size. There is no left ventricular hypertrophy. Abnormal (paradoxical) septal motion, consistent with RV pacemaker. Left ventricular diastolic parameters are consistent with Grade II diastolic dysfunction (pseudonormalization).  Right Ventricle: The right ventricular size is mildly enlarged. No increase in right ventricular wall thickness. Right ventricular systolic function is normal. There is normal pulmonary artery systolic pressure. The tricuspid regurgitant velocity is 2.40 m/s, and with an assumed right atrial pressure of 3 mmHg, the estimated right ventricular systolic pressure is 26.0 mmHg.  Left Atrium: Left atrial size was mild to moderately dilated.  Right Atrium: Right  atrial size was mildly dilated.  Pericardium: There is no evidence of pericardial effusion.  Mitral Valve: The mitral valve is grossly normal. Mild mitral annular calcification. Mild mitral valve regurgitation. No evidence of mitral valve stenosis.  Tricuspid Valve: The tricuspid valve is grossly normal. Tricuspid valve regurgitation is trivial. No evidence of tricuspid stenosis.  Aortic Valve: The aortic valve is tricuspid. There is severe calcifcation of the aortic valve. There is severe thickening of the aortic valve. Aortic valve regurgitation is not visualized. Moderate aortic stenosis is present. Aortic valve mean gradient measures 28.0 mmHg. Aortic valve peak gradient measures 41.2 mmHg. Aortic valve area, by VTI measures 1.04 cm.  Pulmonic Valve: The pulmonic valve was grossly normal. Pulmonic valve regurgitation is not visualized. No evidence of pulmonic stenosis.  Aorta: The aortic root and ascending aorta are structurally normal, with no evidence of dilitation.  Venous: The right lower pulmonary vein is normal. The inferior vena cava is normal in size with greater than 50% respiratory variability, suggesting right atrial pressure of 3 mmHg.  IAS/Shunts: The atrial septum is grossly normal.  Additional Comments: A device lead is visualized in the right atrium and right ventricle.   LEFT  VENTRICLE PLAX 2D LVIDd:         5.73 cm   Diastology LVIDs:         4.15 cm   LV e' medial:    6.64 cm/s LV PW:         0.80 cm   LV E/e' medial:  15.7 LV IVS:        0.80 cm   LV e' lateral:   8.59 cm/s LVOT diam:     2.00 cm   LV E/e' lateral: 12.1 LV SV:         88 LV SV Index:   43        2D Longitudinal Strain LVOT Area:     3.14 cm  2D Strain GLS (A2C):   -22.4 % 2D Strain GLS (A3C):   -22.7 % 2D Strain GLS (A4C):   -20.6 % 2D Strain GLS Avg:     -21.9 %  RIGHT VENTRICLE RV Basal diam:  4.00 cm RV Mid diam:    3.00 cm RV S prime:     12.00 cm/s TAPSE (M-mode): 1.7 cm RVSP:            26.0 mmHg  LEFT ATRIUM             Index        RIGHT ATRIUM           Index LA diam:        5.55 cm 2.73 cm/m   RA Pressure: 3.00 mmHg LA Vol (A2C):   99.1 ml 48.78 ml/m  RA Area:     19.10 cm LA Vol (A4C):   82.8 ml 40.76 ml/m  RA Volume:   49.10 ml  24.17 ml/m LA Biplane Vol: 90.9 ml 44.75 ml/m AORTIC VALVE AV Area (Vmax):    1.07 cm AV Area (Vmean):   0.94 cm AV Area (VTI):     1.04 cm AV Vmax:           321.00 cm/s AV Vmean:          258.000 cm/s AV VTI:            0.848 m AV Peak Grad:      41.2 mmHg AV Mean Grad:      28.0 mmHg LVOT Vmax:         109.50 cm/s LVOT Vmean:        77.500 cm/s LVOT VTI:          0.280 m LVOT/AV VTI ratio: 0.33  AORTA Ao Root diam: 3.20 cm Ao Asc diam:  3.90 cm  MITRAL VALVE                TRICUSPID VALVE MV Area (PHT):              TR Peak grad:   23.0 mmHg MV Decel Time:              TR Vmax:        240.00 cm/s MV E velocity: 104.00 cm/s  Estimated RAP:  3.00 mmHg MV A velocity: 93.00 cm/s   RVSP:           26.0 mmHg MV E/A ratio:  1.12 SHUNTS Systemic VTI:  0.28 m Systemic Diam: 2.00 cm  Lennie Odor MD Electronically signed by Lennie Odor MD Signature Date/Time: 12/01/2022/11:31:37 AM    Final (Updated)   TEE  ECHO INTRAOPERATIVE TEE 07/26/2019  Interpretation Summary   Tricuspid Valve: The tricuspid annulus is not  dilated.   Left ventricle: Normal cavity size. Concentric hypertrophy of moderate severity.   Left atrium: Left atrial appendage filling and emptying velocities are normal.   Aortic valve: Moderate valve thickening present. Mild valve calcification present. Mild stenosis.   Aorta: The ascending aorta is mildly dilated.   Mitral valve:  Mild mitral annular calcification. Trace regurgitation.   Right ventricle:  Normal cavity size, wall thickness and ejection fraction.   Right atrium:  Pacer wire present in the right atrium.   Tricuspid valve: Trace regurgitation.            Risk  Assessment/Calculations:    CHA2DS2-VASc Score = 6   This indicates a 9.7% annual risk of stroke. The patient's score is based upon: CHF History: 1 HTN History: 1 Diabetes History: 1 Stroke History: 0 Vascular Disease History: 1 Age Score: 2 Gender Score: 0           Lab Results  Component Value Date   WBC 4.4 09/21/2022   HGB 12.7 (L) 09/21/2022   HCT 37.9 09/21/2022   MCV 92 09/21/2022   PLT 154 09/21/2022   Lab Results  Component Value Date   CREATININE 1.12 09/21/2022   BUN 20 09/21/2022   NA 137 09/21/2022   K 4.3 09/21/2022   CL 102 09/21/2022   CO2 28 09/21/2022   Lab Results  Component Value Date   ALT 15 10/30/2021   AST 20 10/30/2021   ALKPHOS 49 10/22/2020   BILITOT 1.1 10/30/2021   Lab Results  Component Value Date   CHOL 136 11/02/2022   HDL 68.60 11/02/2022   LDLCALC 59 11/02/2022   TRIG 45.0 11/02/2022   CHOLHDL 2 11/02/2022    Lab Results  Component Value Date   HGBA1C 5.0 02/01/2020     Assessment & Plan    1.  Chest and back pain: -Patient reports greater than 2-week scapular and back pain between shoulder blades that has been constant and is eased with Tylenol -Today patient reports discomfort that is ongoing and slightly alleviated with rest. -He reports similar discomfort prior to his previous MI and CABG in 05/2019 -Start Imdur 30 mg daily -We will undergo Lexiscan Myoview to rule out possible ischemia related to back pain.   2.  Coronary artery disease: -s/p CABG 05/2019: L-LAD, S-OM1, S-PDA and last 2D echo was 11/2022 with improved EF of 60 to 65% -Today patient reports back discomfort similar to previous anginal equivalent in 2020. -Continue current GDMT with Crestor 20 mg, and currently not on ASA 81 mg due to Eliquis for atrial fibrillation  3.  HFrEF: -Last 2D echo completed 11/2022 with EF of 60 to 65% and and grade 2 DD with moderate dilated LA and mildly dilated RA -Today patient reports no shortness of breath or  lower extremity swelling. -Continue GDMT with Entresto 24/26 mg daily -Low sodium diet, fluid restriction <2L, and daily weights encouraged. Educated to contact our office for weight gain of 2 lbs overnight or 5 lbs in one week.   4.  History of sick sinus syndrome/CHB -s/p Medtronic dual-chamber PPM upgraded to BiV PPM on 08/2027 -Device functioning appropriately with normal Paceart  5.  Essential hypertension: -Patient's blood pressure today was 122/68. -Continue triamterene-HCTZ 37.5-25 mg  6.  Moderate aortic stenosis: -Patient scheduled to undergo surveillance echocardiogram  -Patient reports no syncope or shortness of breath   Disposition: Follow-up with Tonny Bollman, MD as scheduled Shared Decision Making/Informed Consent The risks [chest pain, shortness of  breath, cardiac arrhythmias, dizziness, blood pressure fluctuations, myocardial infarction, stroke/transient ischemic attack, nausea, vomiting, allergic reaction, radiation exposure, metallic taste sensation and life-threatening complications (estimated to be 1 in 10,000)], benefits (risk stratification, diagnosing coronary artery disease, treatment guidance) and alternatives of a nuclear stress test were discussed in detail with Derrick [name] and he agrees to proceed.   Medication Adjustments/Labs and Tests Ordered: Current medicines are reviewed at length with the patient today.  Concerns regarding medicines are outlined above.   Signed, Napoleon Form, Leodis Rains, NP 04/21/2023, 5:49 PM [facility]

## 2023-04-21 ENCOUNTER — Encounter: Payer: Self-pay | Admitting: Hematology and Oncology

## 2023-04-21 ENCOUNTER — Ambulatory Visit: Payer: Medicare Other | Attending: Nurse Practitioner | Admitting: Nurse Practitioner

## 2023-04-21 ENCOUNTER — Encounter: Payer: Self-pay | Admitting: Cardiovascular Disease

## 2023-04-21 ENCOUNTER — Encounter: Payer: Self-pay | Admitting: Nurse Practitioner

## 2023-04-21 VITALS — BP 122/68 | HR 69 | Ht 69.0 in | Wt 195.2 lb

## 2023-04-21 DIAGNOSIS — I25119 Atherosclerotic heart disease of native coronary artery with unspecified angina pectoris: Secondary | ICD-10-CM

## 2023-04-21 DIAGNOSIS — I350 Nonrheumatic aortic (valve) stenosis: Secondary | ICD-10-CM | POA: Insufficient documentation

## 2023-04-21 DIAGNOSIS — I10 Essential (primary) hypertension: Secondary | ICD-10-CM | POA: Insufficient documentation

## 2023-04-21 DIAGNOSIS — I480 Paroxysmal atrial fibrillation: Secondary | ICD-10-CM | POA: Diagnosis not present

## 2023-04-21 DIAGNOSIS — R079 Chest pain, unspecified: Secondary | ICD-10-CM

## 2023-04-21 DIAGNOSIS — I5020 Unspecified systolic (congestive) heart failure: Secondary | ICD-10-CM | POA: Diagnosis not present

## 2023-04-21 DIAGNOSIS — I4891 Unspecified atrial fibrillation: Secondary | ICD-10-CM | POA: Diagnosis not present

## 2023-04-21 MED ORDER — ISOSORBIDE MONONITRATE ER 30 MG PO TB24: 30.0000 mg | ORAL_TABLET | Freq: Every day | ORAL | 3 refills | Status: DC

## 2023-04-21 MED ORDER — APIXABAN 5 MG PO TABS: 5.0000 mg | ORAL_TABLET | Freq: Two times a day (BID) | ORAL | 1 refills | Status: DC

## 2023-04-21 NOTE — Patient Instructions (Addendum)
Medication Instructions:  START Imdur  take 1 tablet once a day  *If you need a refill on your cardiac medications before your next appointment, please call your pharmacy*   Lab Work: None ordered   Testing/Procedures: Your physician has requested that you have a lexiscan myoview. For further information please visit [email or web address] Please follow instruction sheet, as given.   Follow-Up: At [facility], you and your health needs are our priority.  As part of our continuing mission to provide you with exceptional heart care, we have created designated Provider Care Teams.  These Care Teams include your primary Cardiologist (physician) and Advanced Practice Providers (APPs -  Physician Assistants and Nurse Practitioners) who all work together to provide you with the care you need, when you need it.  We recommend signing up for the patient portal called "MyChart".  Sign up information is provided on this After Visit Summary.  MyChart is used to connect with patients for Virtual Visits (Telemedicine).  Patients are able to view lab/test results, encounter notes, upcoming appointments, etc.  Non-urgent messages can be sent to your provider as well.   To learn more about what you can do with MyChart, go to [email or web address].    Your next appointment:   FOLLOW UP AS SCHEDULED   Provider:   Tonny Bollman, MD     Other Instructions

## 2023-04-26 NOTE — Progress Notes (Signed)
Remote pacemaker transmission.   

## 2023-04-27 ENCOUNTER — Telehealth (HOSPITAL_COMMUNITY): Payer: Self-pay | Admitting: *Deleted

## 2023-04-27 DIAGNOSIS — H401132 Primary open-angle glaucoma, bilateral, moderate stage: Secondary | ICD-10-CM | POA: Diagnosis not present

## 2023-04-27 NOTE — Telephone Encounter (Signed)
Left message on voicemail per DPR in reference to upcoming appointment scheduled on 05/04/23  with detailed instructions given per Myocardial Perfusion Study Information Sheet for the test. LM to arrive 15 minutes early, and that it is imperative to arrive on time for appointment to keep from having the test rescheduled. If you need to cancel or reschedule your appointment, please call the office within 24 hours of your appointment. Failure to do so may result in a cancellation of your appointment, and a $50 no show fee. Phone number given for call back for any questions. Ricky Ala

## 2023-05-02 DIAGNOSIS — E663 Overweight: Secondary | ICD-10-CM | POA: Diagnosis not present

## 2023-05-02 DIAGNOSIS — M4810 Ankylosing hyperostosis [Forestier], site unspecified: Secondary | ICD-10-CM | POA: Diagnosis not present

## 2023-05-02 DIAGNOSIS — D860 Sarcoidosis of lung: Secondary | ICD-10-CM | POA: Diagnosis not present

## 2023-05-02 DIAGNOSIS — M1991 Primary osteoarthritis, unspecified site: Secondary | ICD-10-CM | POA: Diagnosis not present

## 2023-05-02 DIAGNOSIS — Z6828 Body mass index (BMI) 28.0-28.9, adult: Secondary | ICD-10-CM | POA: Diagnosis not present

## 2023-05-02 DIAGNOSIS — M25561 Pain in right knee: Secondary | ICD-10-CM | POA: Diagnosis not present

## 2023-05-02 DIAGNOSIS — D472 Monoclonal gammopathy: Secondary | ICD-10-CM | POA: Diagnosis not present

## 2023-05-02 DIAGNOSIS — G6181 Chronic inflammatory demyelinating polyneuritis: Secondary | ICD-10-CM | POA: Diagnosis not present

## 2023-05-03 ENCOUNTER — Encounter: Payer: Self-pay | Admitting: Hematology and Oncology

## 2023-05-04 ENCOUNTER — Ambulatory Visit (HOSPITAL_COMMUNITY): Payer: Medicare Other | Attending: Nurse Practitioner

## 2023-05-04 DIAGNOSIS — I25119 Atherosclerotic heart disease of native coronary artery with unspecified angina pectoris: Secondary | ICD-10-CM | POA: Diagnosis not present

## 2023-05-04 DIAGNOSIS — I4891 Unspecified atrial fibrillation: Secondary | ICD-10-CM | POA: Insufficient documentation

## 2023-05-04 DIAGNOSIS — I350 Nonrheumatic aortic (valve) stenosis: Secondary | ICD-10-CM | POA: Diagnosis not present

## 2023-05-04 DIAGNOSIS — I480 Paroxysmal atrial fibrillation: Secondary | ICD-10-CM | POA: Insufficient documentation

## 2023-05-04 DIAGNOSIS — R079 Chest pain, unspecified: Secondary | ICD-10-CM | POA: Diagnosis not present

## 2023-05-04 DIAGNOSIS — I10 Essential (primary) hypertension: Secondary | ICD-10-CM | POA: Insufficient documentation

## 2023-05-04 DIAGNOSIS — I5020 Unspecified systolic (congestive) heart failure: Secondary | ICD-10-CM | POA: Insufficient documentation

## 2023-05-04 LAB — MYOCARDIAL PERFUSION IMAGING
Estimated workload: 1
Exercise duration (min): 1 min
Exercise duration (sec): 0 s
LV dias vol: 110 mL (ref 62–150)
LV sys vol: 42 mL
MPHR: 140 {beats}/min
Nuc Stress EF: 62 %
Peak HR: 67 {beats}/min
Percent HR: 47 %
Rest HR: 61 {beats}/min
Rest Nuclear Isotope Dose: 10.5 mCi
SDS: 0
SRS: 0
SSS: 0
ST Depression (mm): 0 mm
Stress Nuclear Isotope Dose: 30.4 mCi
TID: 0.99

## 2023-05-04 MED ORDER — REGADENOSON 0.4 MG/5ML IV SOLN: 0.4000 mg | Freq: Once | INTRAVENOUS | Status: DC

## 2023-05-04 MED ORDER — TECHNETIUM TC 99M TETROFOSMIN IV KIT: 30.4000 | PACK | Freq: Once | INTRAVENOUS | Status: DC | PRN

## 2023-05-04 MED ORDER — TECHNETIUM TC 99M TETROFOSMIN IV KIT
10.5000 | PACK | Freq: Once | INTRAVENOUS | Status: AC | PRN
  Administered 2023-05-04: 10.5 via INTRAVENOUS

## 2023-05-05 ENCOUNTER — Other Ambulatory Visit: Payer: Self-pay

## 2023-05-06 ENCOUNTER — Telehealth: Payer: Self-pay

## 2023-05-06 ENCOUNTER — Other Ambulatory Visit: Payer: Self-pay | Admitting: Hematology and Oncology

## 2023-05-06 ENCOUNTER — Other Ambulatory Visit: Payer: Self-pay

## 2023-05-06 ENCOUNTER — Inpatient Hospital Stay: Payer: Medicare Other | Attending: Hematology and Oncology

## 2023-05-06 DIAGNOSIS — R61 Generalized hyperhidrosis: Secondary | ICD-10-CM | POA: Diagnosis not present

## 2023-05-06 DIAGNOSIS — D472 Monoclonal gammopathy: Secondary | ICD-10-CM | POA: Diagnosis not present

## 2023-05-06 DIAGNOSIS — G6181 Chronic inflammatory demyelinating polyneuritis: Secondary | ICD-10-CM | POA: Insufficient documentation

## 2023-05-06 DIAGNOSIS — R5382 Chronic fatigue, unspecified: Secondary | ICD-10-CM | POA: Diagnosis not present

## 2023-05-06 DIAGNOSIS — D508 Other iron deficiency anemias: Secondary | ICD-10-CM

## 2023-05-06 DIAGNOSIS — C186 Malignant neoplasm of descending colon: Secondary | ICD-10-CM | POA: Diagnosis not present

## 2023-05-06 LAB — CBC WITH DIFFERENTIAL (CANCER CENTER ONLY)
Abs Immature Granulocytes: 0.01 10*3/uL (ref 0.00–0.07)
Basophils Absolute: 0 10*3/uL (ref 0.0–0.1)
Basophils Relative: 1 %
Eosinophils Absolute: 0.2 10*3/uL (ref 0.0–0.5)
Eosinophils Relative: 4 %
HCT: 36.3 % — ABNORMAL LOW (ref 39.0–52.0)
Hemoglobin: 12.2 g/dL — ABNORMAL LOW (ref 13.0–17.0)
Immature Granulocytes: 0 %
Lymphocytes Relative: 17 %
Lymphs Abs: 0.9 10*3/uL (ref 0.7–4.0)
MCH: 32.1 pg (ref 26.0–34.0)
MCHC: 33.6 g/dL (ref 30.0–36.0)
MCV: 95.5 fL (ref 80.0–100.0)
Monocytes Absolute: 0.5 10*3/uL (ref 0.1–1.0)
Monocytes Relative: 10 %
Neutro Abs: 3.5 10*3/uL (ref 1.7–7.7)
Neutrophils Relative %: 68 %
Platelet Count: 139 10*3/uL — ABNORMAL LOW (ref 150–400)
RBC: 3.8 MIL/uL — ABNORMAL LOW (ref 4.22–5.81)
RDW: 12.6 % (ref 11.5–15.5)
WBC Count: 5.1 10*3/uL (ref 4.0–10.5)
nRBC: 0 % (ref 0.0–0.2)

## 2023-05-06 LAB — CMP (CANCER CENTER ONLY)
ALT: 13 U/L (ref 0–44)
AST: 19 U/L (ref 15–41)
Albumin: 4.5 g/dL (ref 3.5–5.0)
Alkaline Phosphatase: 41 U/L (ref 38–126)
Anion gap: 7 (ref 5–15)
BUN: 31 mg/dL — ABNORMAL HIGH (ref 8–23)
CO2: 25 mmol/L (ref 22–32)
Calcium: 9.9 mg/dL (ref 8.9–10.3)
Chloride: 106 mmol/L (ref 98–111)
Creatinine: 1.64 mg/dL — ABNORMAL HIGH (ref 0.61–1.24)
GFR, Estimated: 42 mL/min — ABNORMAL LOW (ref >60)
Glucose, Bld: 93 mg/dL (ref 70–99)
Potassium: 4.3 mmol/L (ref 3.5–5.1)
Sodium: 138 mmol/L (ref 135–145)
Total Bilirubin: 0.7 mg/dL (ref 0.3–1.2)
Total Protein: 7.3 g/dL (ref 6.5–8.1)

## 2023-05-06 LAB — IRON AND IRON BINDING CAPACITY (CC-WL,HP ONLY)
Iron: 51 ug/dL (ref 45–182)
Saturation Ratios: 13 % — ABNORMAL LOW (ref 17.9–39.5)
TIBC: 400 ug/dL (ref 250–450)
UIBC: 349 ug/dL (ref 117–376)

## 2023-05-06 LAB — FERRITIN: Ferritin: 66 ng/mL (ref 24–336)

## 2023-05-06 NOTE — Telephone Encounter (Signed)
Called per Dr. Bertis Ruddy and given creatinine results. Instructed to drink more fluids at least 80 ounces a day. Moved appt with Dr. Bertis Ruddy to 5/23, lab at 8 am and MD at 0840. He verbalized understanding and is aware of appt.

## 2023-05-09 ENCOUNTER — Telehealth: Payer: Medicare Other | Admitting: Nurse Practitioner

## 2023-05-09 DIAGNOSIS — R300 Dysuria: Secondary | ICD-10-CM

## 2023-05-09 LAB — KAPPA/LAMBDA LIGHT CHAINS
Kappa free light chain: 43.7 mg/L — ABNORMAL HIGH (ref 3.3–19.4)
Kappa, lambda light chain ratio: 2.26 — ABNORMAL HIGH (ref 0.26–1.65)
Lambda free light chains: 19.3 mg/L (ref 5.7–26.3)

## 2023-05-10 ENCOUNTER — Telehealth: Payer: Self-pay

## 2023-05-10 ENCOUNTER — Encounter: Payer: Self-pay | Admitting: Hematology and Oncology

## 2023-05-10 ENCOUNTER — Ambulatory Visit
Admission: EM | Admit: 2023-05-10 | Discharge: 2023-05-10 | Disposition: A | Discharge status: Home / Self Care | Payer: Medicare Other | Attending: Internal Medicine | Admitting: Internal Medicine

## 2023-05-10 DIAGNOSIS — R300 Dysuria: Secondary | ICD-10-CM | POA: Diagnosis not present

## 2023-05-10 DIAGNOSIS — R350 Frequency of micturition: Secondary | ICD-10-CM | POA: Diagnosis not present

## 2023-05-10 LAB — POCT URINALYSIS DIP (MANUAL ENTRY)
Bilirubin, UA: NEGATIVE
Blood, UA: NEGATIVE — AB
Glucose, UA: NEGATIVE mg/dL
Ketones, POC UA: NEGATIVE mg/dL
Leukocytes, UA: NEGATIVE
Nitrite, UA: NEGATIVE
Protein Ur, POC: NEGATIVE mg/dL
Spec Grav, UA: 1.015 (ref 1.010–1.025)
Urobilinogen, UA: 2 E.U./dL — AB
pH, UA: 6 (ref 5.0–8.0)

## 2023-05-10 MED ORDER — SULFAMETHOXAZOLE-TRIMETHOPRIM 800-160 MG PO TABS: 1.0000 | ORAL_TABLET | Freq: Two times a day (BID) | ORAL | 0 refills | Status: AC

## 2023-05-10 NOTE — Progress Notes (Signed)
E-Visit for Urinary Problems ? ?Based on what you shared with me, I feel your condition warrants further evaluation and I recommend that you be seen for a face to face office visit.  Male bladder infections are not very common.  We worry about prostate or kidney conditions.  The standard of care is to examine the abdomen and kidneys, and to do a urine and blood test to make sure that something more serious is not going on.  We recommend that you see a provider today.  If your doctor's office is closed [facility] has the following Urgent Cares: ? ?  ?NOTE: You will not be charged for this e-visit. ? ?If you are having a true medical emergency please call 911.   ? ?  ? For an urgent face to face visit, [facility] has six urgent care centers for your convenience:  ?  ? [facility] [place] ?Get Driving Directions ?[phone] ?[address] ?[place], [place] [address] ?  ? [facility] ([place]) ?Get Driving Directions ?[phone] ?[address] ?[place], [place] [address] ? ?[facility] ([place] [facility] [place]) ?Get Driving Directions ?[phone] ?[address] ?[place],  [place]  [address] ? ?[facility] ?Get Driving Directions ?[phone] ?[address] ?[place], [place] [address] ?  ?[facility] [place] ?Get Driving Directions  ?[phone] ?[address] ?[address] ?[place], [place] [address] ?  ?[facility] [place] ?Get Driving Directions ?[phone] ?[address] ?[place],  [address] ? ?Your MyChart E-visit questionnaire answers were reviewed by a board certified advanced clinical practitioner to complete your personal care plan based on your specific symptoms.  Thank you for using e-Visits. ?

## 2023-05-10 NOTE — Discharge Instructions (Signed)
I have sent an antibiotic to treat infection.  Urine culture and blood work are pending.  Will call if they are abnormal.  I recommend that you follow-up with urologist as soon as possible as we discussed.  Go to the ER if symptoms persist or worsen.

## 2023-05-10 NOTE — Telephone Encounter (Signed)
Called to follow up on mychart message. He has had burning with urination for about 10 days. He did a e-visit yesterday and was instructed to go to urgent care for evaluation. He is driving back from [place] today and will go to urgent care today when he gets home. He just wanted to make the office and Dr. Bertis Ruddy aware.  He will call the office back for questions and to give update.

## 2023-05-10 NOTE — ED Triage Notes (Signed)
Patient states for about 10 days he has been having burning with urination. Patient states he seen his cancer doctor and had renal function blood work done that was abnormal. Doctor told him to come to UC to have urine checked.

## 2023-05-10 NOTE — ED Provider Notes (Signed)
[facility]    CSN: [id number] Arrival date & time: 05/10/23  1253      History   Chief Complaint Chief Complaint  Patient presents with   Dysuria    HPI Derrick [name] is a 82 y.o. male.   Patient reports burning and frequency that started about 10 days ago.  Denies abdominal pain, penile discharge, back pain, fever, nausea, vomiting, chills.  Patient reports history of prostatitis, BPH, urinary tract infections.  Reports that this does not feel similar to prostate infection but he thinks there "may be some prostate swelling".  Patient was seen at oncology a few days prior and reports that his kidney function was recently elevated and was told to come to urgent care to have urine checked for infection.  He does see urology and last saw them last year.  Patient is not sure when his last urinary tract infection was but reports it has been "a while".  Patient reports that he is sexually active but denies any exposure or concern for STD.   Dysuria   Past Medical History:  Diagnosis Date   Anemia    iron deficiency anemia- infusion on 02/15/2020 and 02/22/2020   BENIGN PROSTATIC HYPERTROPHY, WITH OBSTRUCTION 05/28/2010   CAD (coronary artery disease)    a. s/p NSTEMI 06/01/11: DES to RCA;  b. cath 06/25/11:   dLM 50-60% (FFR 0.87), prox to mid LAD 40-50%, D1 50%, pCFX 50%, RCA stent ok, dPDA 80%, EF 55-60%.  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   DISH (diffuse idiopathic skeletal hyperostosis)  12/02/2015   Diverticulitis    GENERALIZED OSTEOARTHROSIS UNSPECIFIED SITE 05/28/2010   GERD 05/28/2010   Heart murmur    History of gout    History of kidney stones    HYPERLIPIDEMIA 05/28/2010   Hypertension    IgM lambda paraproteinemia    Intrinsic asthma, 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   "associated w/lung sarcoidosis"   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   MITRAL VALVE PROLAPSE 05/28/2010   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   NSTEMI (non-ST elevated myocardial infarction) ([name]) 06/03/11   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   OSA on CPAP 05/28/2010   PALPITATIONS, CHRONIC 05/28/2010   Parotid gland pain 2010   infection   Peripheral neuropathy    "tx'd w/targeted chemo" (05/12/2016)   Pneumonia 2-3 times   Presence of permanent cardiac pacemaker    [employer]   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   Sarcoidosis    pulmonalis   UNSPECIFIED INFLAMMATORY AND TOXIC NEUROPATHY 05/28/2010   Vision loss    VITAMIN D DEFICIENCY 05/28/2010    Patient Active Problem List   Diagnosis Date Noted   (HFpEF) heart failure with preserved ejection fraction ([name]) 07/02/2022   COVID-19 determined by clinical diagnostic criteria 10/19/2021   Carpal tunnel syndrome 06/27/2020   Overweight (BMI 25.0-29.9) 03/12/2020   S/P right TKA 03/11/2020   Deficiency anemia 02/08/2020   Iron deficiency anemia 02/08/2020   S/P CABG  x 3 06/05/2019   Inguinal hernia 02/21/2019   Chronic fatigue 08/16/2018   Allergic response, initial encounter 06/01/2018   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   MCI (mild cognitive impairment) 01/26/2018   Pacemaker ECG pattern 11/30/2017   Bradycardia, sinus 11/30/2017   Ventricular tachycardia (paroxysmal) ([name]) 11/30/2017   Monocytosis 06/10/2017   S/P placement of cardiac pacemaker 08/03/2016   Paroxysmal atrial fibrillation  ([name]) 05/12/2016   Bradycardia 02/09/2016   DISH (diffuse idiopathic skeletal hyperostosis) 12/02/2015   Coronary artery disease involving coronary bypass graft of native heart without angina pectoris 03/26/2015   DDD (degenerative disc disease), thoracic 03/26/2015   OSA on CPAP 10/16/2014   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/16/2014   Essential hypertension, benign 05/07/2014   Routine health maintenance 12/16/2013   Neuropathy associated with MGUS ([name]) 08/08/2013   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   Allergic rhinitis 02/21/2012   Coronary artery disease involving native coronary artery of native heart with angina pectoris ([name]) 06/22/2011   Malignant basal cell neoplasm of skin 06/16/2011   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   History of colon cancer 05/28/2010   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   Vitamin D deficiency 05/28/2010   Hyperlipidemia with target LDL less than 100 05/28/2010   GERD 05/28/2010   BPH associated with nocturia 05/28/2010   Osteoarthritis of both hands 05/28/2010   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    Past Surgical History:  Procedure Laterality Date   APPENDECTOMY  1954   age 67   BIV UPGRADE N/A 09/23/2022   Procedure: BIV PACEMAKER UPGRADE;  Surgeon: Regan Lemming, MD;  Location: [facility] INVASIVE CV LAB;  Service: Cardiovascular;  Laterality: N/A;   CATARACT EXTRACTION W/ INTRAOCULAR LENS IMPLANT Left    COLON SURGERY  2000   decending colon    CORONARY ANGIOPLASTY WITH STENT PLACEMENT  06/03/11   CORONARY ARTERY BYPASS GRAFT N/A 06/05/2019   Procedure: CORONARY ARTERY BYPASS GRAFTING (CABG) TIMES THREE USING LEFT INTERNAL MAMMARY ARTERY AND RIGHT GREATER SEPHANOUS VEIN;  Surgeon: Kerin Perna, MD;  Location: [facility] OR;  Service: Open Heart Surgery;  Laterality: N/A;   CORONARY PRESSURE/FFR STUDY N/A 05/15/2019   Procedure: INTRAVASCULAR PRESSURE WIRE/FFR STUDY;   Surgeon: Tonny Bollman, MD;  Location: [facility] INVASIVE CV LAB;  Service: Cardiovascular;  Laterality: N/A;   ELECTROPHYSIOLOGIC STUDY N/A 05/12/2016   Procedure: Cardioversion;  Surgeon: Will Jorja Loa, MD;  Location: [facility];  Service: Cardiovascular;  Laterality: N/A;   EP IMPLANTABLE DEVICE N/A 05/12/2016   Procedure: Pacemaker Implant;  Surgeon: Will Jorja Loa, MD;  Location: [facility];  Service: Cardiovascular;  Laterality: N/A;   EYE SURGERY     bilateral cataract with lens implant   HERNIA REPAIR  01/2019   left inguinal hernia repair   INGUINAL HERNIA REPAIR Left 02/21/2019   Procedure: LEFT INGUINAL HERNIA REPAIR WITH MESH;  Surgeon: Harriette Bouillon, MD;  Location: [facility] OR;  Service: General;  Laterality: Left;   INSERT / REPLACE / REMOVE PACEMAKER  05/12/2016   KNEE ARTHROSCOPY Right 2003   LEFT HEART CATH AND CORONARY ANGIOGRAPHY N/A 05/15/2019   Procedure: LEFT HEART CATH AND CORONARY ANGIOGRAPHY;  Surgeon: Tonny Bollman, MD;  Location: [facility] INVASIVE CV LAB;  Service: Cardiovascular;  Laterality: N/A;   LEFT HEART CATHETERIZATION WITH CORONARY ANGIOGRAM N/A 03/31/2015   Procedure: LEFT HEART CATHETERIZATION WITH CORONARY ANGIOGRAM;  Surgeon: Tonny Bollman, MD;  Location: [facility] CATH LAB;  Service: Cardiovascular;  Laterality: N/A;   LUNG SURGERY  2001   "open lung dissection"   MOHS SURGERY Left ~ 2008   ear   PROSTATE BIOPSY  2010   TEE WITHOUT CARDIOVERSION N/A 06/05/2019   Procedure: TRANSESOPHAGEAL ECHOCARDIOGRAM (TEE);  Surgeon: Donata Clay, Theron Arista, MD;  Location: [facility] OR;  Service: Open Heart Surgery;  Laterality: N/A;   TONSILLECTOMY  1950   TOTAL KNEE ARTHROPLASTY Right 03/11/2020   Procedure: TOTAL KNEE ARTHROPLASTY;  Surgeon: Durene Romans, MD;  Location: [facility] ORS;  Service: Orthopedics;  Laterality: Right;  70 mins       Home Medications    Prior to Admission medications   Medication Sig Start Date End Date Taking? Authorizing Provider   sulfamethoxazole-trimethoprim (BACTRIM DS) 800-160 MG tablet Take 1 tablet by mouth 2 (two) times daily for 7 days. 05/10/23 05/17/23 Yes , Acie Fredrickson, FNP  albuterol (VENTOLIN HFA) 108 (90 Base) MCG/ACT inhaler Inhale 2 puffs into the lungs every 6 (six) hours as needed for wheezing or shortness of breath. 12/12/19   Bedsole, Amy E, MD  amoxicillin (AMOXIL) 500 MG capsule TAKE 2 TABLETS BY MOUTH 1 HOUR PRIOR TO DENTAL APPOINTMENT AND 2 TABLETS IMMEDIATELY FOLLOWING DENTAL APPOINTMENT 09/07/22   [provider]  apixaban (ELIQUIS) 5 MG TABS tablet Take 1 tablet (5 mg total) by mouth 2 (two) times daily. 04/21/23   Gaston Islam., NP  bimatoprost (LUMIGAN) 0.01 % SOLN Place 1 drop into both eyes at bedtime.    [provider]  Cholecalciferol 25 MCG (1000 UT) tablet Take 1,000 Units by mouth daily.     [provider]  diclofenac Sodium (VOLTAREN) 1 % GEL Apply 2 g topically 4 (four) times daily as needed (pain).    [provider]  dorzolamide-timolol (COSOPT) 22.3-6.8 MG/ML ophthalmic solution Place 1 drop into both eyes 2 (two) times daily.  09/30/15   [provider]  dronedarone (MULTAQ) 400 MG tablet Take 1 tablet (400 mg total) by mouth 2 (two) times daily with a meal. 10/20/22   Camnitz, Andree Coss, MD  finasteride (PROSCAR) 5 MG tablet Take 5 mg by mouth daily after breakfast.  10/13/12   [provider]  fluticasone (FLONASE) 50 MCG/ACT nasal spray Place 2 sprays into both nostrils 2 (two) times daily.    [provider]  Fluticasone Propionate, Inhal, (FLOVENT DISKUS) 100 MCG/ACT AEPB Inhale 1 puff into the lungs 2 (two) times daily as needed. 11/16/21   Excell Seltzer, MD  Multiple Vitamin (MULTIVITAMIN WITH MINERALS) TABS tablet Take 1 tablet by mouth daily.    [provider]  pantoprazole (PROTONIX) 40 MG tablet Take 1 tablet by mouth once daily 04/11/23   Tonny Bollman, MD  Polyethyl Glycol-Propyl Glycol  (SYSTANE OP) Place 1 drop into both eyes daily as needed (dry eyes).    [provider]  rosuvastatin (CRESTOR) 20 MG tablet Take 1 tablet by mouth once daily 04/11/23   Tonny Bollman, MD  sacubitril-valsartan (ENTRESTO) 24-26 MG Take 1 tablet by mouth 2 (two) times daily. 03/21/23   Tonny Bollman, MD  tadalafil (CIALIS) 5 MG tablet Take 5 mg by mouth at bedtime.     [provider]  triamterene-hydrochlorothiazide Joseph Pierini) 37.5-25 MG tablet Take 1 tablet by mouth once daily 07/23/22   Tonny Bollman, MD    Family History Family History  Problem Relation Age of Onset   Arthritis Mother  severe   Coronary artery disease Mother    Colon polyps Mother    Heart disease Mother    Sleep apnea Mother    Sleep apnea Father    Coronary artery disease Brother    Cancer Brother        choriocarcinoma   Arrhythmia Brother 81       Afib/Tachycardia   Heart attack Brother    Congestive Heart Failure Brother    Sleep apnea Brother     Social History Social History   Tobacco Use   Smoking status: Never   Smokeless tobacco: Never  Vaping Use   Vaping Use: Never used  Substance Use Topics   Alcohol use: Yes    Comment: occasional   Drug use: No     Allergies   Nsaids   Review of Systems Review of Systems Per HPI  Physical Exam Triage Vital Signs ED Triage Vitals [05/10/23 1316]  Enc Vitals Group     BP 129/73     Pulse Rate 72     Resp 18     Temp 98 F (36.7 C)     Temp Source Tympanic     SpO2 98 %     Weight      Height      Head Circumference      Peak Flow      Pain Score 4     Pain Loc      Pain Edu?      Excl. in GC?    No data found.  Updated Vital Signs BP 129/73 (BP Location: Left Arm)   Pulse 72   Temp 98 F (36.7 C) (Tympanic)   Resp 18   SpO2 98%   Visual Acuity Right Eye Distance:   Left Eye Distance:   Bilateral Distance:    Right Eye Near:   Left Eye Near:    Bilateral Near:     Physical  Exam Constitutional:      General: He is not in acute distress.    Appearance: Normal appearance. He is not toxic-appearing or diaphoretic.  HENT:     Head: Normocephalic and atraumatic.  Eyes:     Extraocular Movements: Extraocular movements intact.     Conjunctiva/sclera: Conjunctivae normal.  Pulmonary:     Effort: Pulmonary effort is normal.  Genitourinary:    Comments: Deferred Neurological:     General: No focal deficit present.     Mental Status: He is alert and oriented to person, place, and time. Mental status is at baseline.  Psychiatric:        Mood and Affect: Mood normal.        Behavior: Behavior normal.        Thought Content: Thought content normal.        Judgment: Judgment normal.      UC Treatments / Results  Labs (all labs ordered are listed, but only abnormal results are displayed) Labs Reviewed  POCT URINALYSIS DIP (MANUAL ENTRY) - Abnormal; Notable for the following components:      Result Value   Blood, UA negative (*)    Urobilinogen, UA 2.0 (*)    All other components within normal limits  URINE CULTURE  CBC  BASIC METABOLIC PANEL    EKG   Radiology No results found.  Procedures Procedures (including critical care time)  Medications Ordered in UC Medications - No data to display  Initial Impression / Assessment and Plan / UC Course  I have reviewed  the triage vital signs and the nursing notes.  Pertinent labs & imaging results that were available during my care of the patient were reviewed by me and considered in my medical decision making (see chart for details).     UA is not remarkable for infection but given patient's history, will opt to treat with Bactrim for UTI versus prostatitis.  Creatinine clearance is 45 so no dosage adjustment necessary.  Will do 7 days and have patient follow-up with urology for evaluation of prostatitis.  Urine culture is also pending.  Will repeat BMP and CBC given recent creatinine was elevated.   Encouraged strict return and ER precautions.  Patient verbalized understanding and was agreeable with plan. Final Clinical Impressions(s) / UC Diagnoses   Final diagnoses:  Dysuria  Urinary frequency     Discharge Instructions      I have sent an antibiotic to treat infection.  Urine culture and blood work are pending.  Will call if they are abnormal.  I recommend that you follow-up with urologist as soon as possible as we discussed.  Go to the ER if symptoms persist or worsen.     ED Prescriptions     Medication Sig Dispense Auth. Provider   sulfamethoxazole-trimethoprim (BACTRIM DS) 800-160 MG tablet Take 1 tablet by mouth 2 (two) times daily for 7 days. 14 tablet [place], Acie Fredrickson, [place]      PDMP not reviewed this encounter.   Gustavus Bryant, [place] 05/10/23 [phone]

## 2023-05-11 LAB — BASIC METABOLIC PANEL
BUN/Creatinine Ratio: 21 (ref 10–24)
BUN: 23 mg/dL (ref 8–27)
CO2: 22 mmol/L (ref 20–29)
Calcium: 10.8 mg/dL — ABNORMAL HIGH (ref 8.6–10.2)
Chloride: 102 mmol/L (ref 96–106)
Creatinine, Ser: 1.1 mg/dL (ref 0.76–1.27)
Glucose: 100 mg/dL — ABNORMAL HIGH (ref 70–99)
Potassium: 4.5 mmol/L (ref 3.5–5.2)
Sodium: 138 mmol/L (ref 134–144)
eGFR: 68 mL/min/{1.73_m2} (ref >59)

## 2023-05-11 LAB — URINE CULTURE: Culture: NO GROWTH

## 2023-05-11 LAB — CBC
Hematocrit: 39.4 % (ref 37.5–51.0)
Hemoglobin: 13.1 g/dL (ref 13.0–17.7)
MCH: 31.3 pg (ref 26.6–33.0)
MCHC: 33.2 g/dL (ref 31.5–35.7)
MCV: 94 fL (ref 79–97)
Platelets: 150 10*3/uL (ref 150–450)
RBC: 4.18 x10E6/uL (ref 4.14–5.80)
RDW: 12.1 % (ref 11.6–15.4)
WBC: 4 10*3/uL (ref 3.4–10.8)

## 2023-05-12 ENCOUNTER — Telehealth: Payer: Self-pay

## 2023-05-12 ENCOUNTER — Encounter: Payer: Self-pay | Admitting: Hematology and Oncology

## 2023-05-12 NOTE — Telephone Encounter (Signed)
-----   Message from Artis Delay, MD sent at 05/12/2023  8:03 AM EDT ----- Pls call him back [facility] he feels better soon We can cancel labs since he had repeat BMP

## 2023-05-12 NOTE — Telephone Encounter (Signed)
Called to see how he is doing today. He is feeling better today. Given message from Dr. Bertis Ruddy. He appreciated the call. Lab appt canceled on 5/23. He is aware.

## 2023-05-13 ENCOUNTER — Ambulatory Visit: Payer: Medicare Other | Admitting: Hematology and Oncology

## 2023-05-13 LAB — MULTIPLE MYELOMA PANEL, SERUM
Albumin SerPl Elph-Mcnc: 3.8 g/dL (ref 2.9–4.4)
Albumin/Glob SerPl: 1.3 (ref 0.7–1.7)
Alpha 1: 0.3 g/dL (ref 0.0–0.4)
Alpha2 Glob SerPl Elph-Mcnc: 0.7 g/dL (ref 0.4–1.0)
B-Globulin SerPl Elph-Mcnc: 0.8 g/dL (ref 0.7–1.3)
Gamma Glob SerPl Elph-Mcnc: 1.2 g/dL (ref 0.4–1.8)
Globulin, Total: 3.1 g/dL (ref 2.2–3.9)
IgA: 198 mg/dL (ref 61–437)
IgG (Immunoglobin G), Serum: 775 mg/dL (ref 603–1613)
IgM (Immunoglobulin M), Srm: 469 mg/dL — ABNORMAL HIGH (ref 15–143)
Total Protein ELP: 6.9 g/dL (ref 6.0–8.5)

## 2023-05-19 ENCOUNTER — Other Ambulatory Visit: Payer: Medicare Other

## 2023-05-19 ENCOUNTER — Inpatient Hospital Stay (HOSPITAL_BASED_OUTPATIENT_CLINIC_OR_DEPARTMENT_OTHER): Payer: Medicare Other | Admitting: Hematology and Oncology

## 2023-05-19 ENCOUNTER — Encounter: Payer: Self-pay | Admitting: Hematology and Oncology

## 2023-05-19 VITALS — BP 126/64 | HR 94 | Temp 98.3°F | Resp 18 | Ht 69.0 in | Wt 195.4 lb

## 2023-05-19 DIAGNOSIS — R5382 Chronic fatigue, unspecified: Secondary | ICD-10-CM | POA: Diagnosis not present

## 2023-05-19 DIAGNOSIS — D472 Monoclonal gammopathy: Secondary | ICD-10-CM

## 2023-05-19 DIAGNOSIS — R61 Generalized hyperhidrosis: Secondary | ICD-10-CM | POA: Diagnosis not present

## 2023-05-19 DIAGNOSIS — G6181 Chronic inflammatory demyelinating polyneuritis: Secondary | ICD-10-CM

## 2023-05-19 DIAGNOSIS — C186 Malignant neoplasm of descending colon: Secondary | ICD-10-CM

## 2023-05-19 MED ORDER — DULOXETINE HCL 20 MG PO CPEP: 20.0000 mg | ORAL_CAPSULE | Freq: Every day | ORAL | 3 refills | Status: DC

## 2023-05-19 NOTE — Assessment & Plan Note (Signed)
He has chronic fatigue for several years I reinforced that the cause of his fatigue is not from anemia He is in agreement to try Cymbalta to help with his painful neuropathy that could be disturbing his sleep

## 2023-05-19 NOTE — Progress Notes (Signed)
[facility] OFFICE PROGRESS NOTE  Patient Care Team: Excell Seltzer, MD as PCP - General (Family Medicine) Regan Lemming, MD as PCP - Electrophysiology (Cardiology) Tonny Bollman, MD as PCP - Cardiology (Cardiology) Artis Delay, MD as Consulting Physician (Hematology and Oncology) Phil Dopp, [facility] as Pharmacist (Pharmacist)  ASSESSMENT & PLAN:  MGUS (monoclonal gammopathy of unknown significance) I am concerned about abnormal night sweats, anorexia and worsening neuropathy I recommend CT imaging for metastatic cancer evaluation as this abnormal MGUS could be associated with lymphoplasmacytic disorder  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 history of cancer Recommend CT imaging for evaluation  CIDP (chronic inflammatory demyelinating polyneuropathy) He has history of CIDP with worsening neuropathy We discussed the risk and benefits of trial of medication to help with his symptoms so that he can get some sleep After very long discussion, he is in agreement to try Cymbalta at low-dose  Chronic fatigue He has chronic fatigue for several years I reinforced that the cause of his fatigue is not from anemia He is in agreement to try Cymbalta to help with his painful neuropathy that could be disturbing his sleep  Orders Placed This Encounter  Procedures   CT CHEST ABDOMEN PELVIS W CONTRAST    Standing Status:   Future    Standing Expiration Date:   05/18/2024    Order Specific Question:   If indicated for the ordered procedure, I authorize the administration of contrast media per Radiology protocol    Answer:   Yes    Order Specific Question:   Does the patient have a contrast media/X-ray dye allergy?    Answer:   No    Order Specific Question:   Preferred imaging location?    Answer:   [facility] Specific Question:   If indicated for the ordered procedure, I authorize the administration of oral contrast media per  Radiology protocol    Answer:   Yes    All questions were answered. The patient knows to call the clinic with any problems, questions or concerns. The total time spent in the appointment was 40 minutes encounter with patients including review of chart and various tests results, discussions about plan of care and coordination of care plan   Artis Delay, MD 05/19/2023 10:14 AM  INTERVAL HISTORY: Please see below for problem oriented charting. he returns for surveillance follow-up for abnormal MGUS He has several problems He has very poor sleep He had frequent drenching night sweats and severe neuropathy especially in his hands Despite being in bed for 12 hours, he did not get restful sleep He has extreme fatigue He has weakness when he tries to walk He have sharp pain between his scapular and lower back that was evaluated by cardiologist recently and was rule out cardiac cause of his pain He is just miserable overall The patient denies any recent signs or symptoms of bleeding such as spontaneous epistaxis, hematuria or hematochezia.   REVIEW OF SYSTEMS:   Constitutional: Denies fevers, chills  Eyes: Denies blurriness of vision Ears, nose, mouth, throat, and face: Denies mucositis or sore throat Respiratory: Denies cough, dyspnea or wheezes Cardiovascular: Denies palpitation, chest discomfort or lower extremity swelling Gastrointestinal:  Denies nausea, heartburn or change in bowel habits Skin: Denies abnormal skin rashes Lymphatics: Denies new lymphadenopathy or easy bruising Behavioral/Psych: Mood is stable, no new changes  All other systems were reviewed with the patient and are negative.  I have reviewed  the past medical history, past surgical history, social history and family history with the patient and they are unchanged from previous note.  ALLERGIES:  is allergic to nsaids.  MEDICATIONS:  Current Outpatient Medications  Medication Sig Dispense Refill   DULoxetine  (CYMBALTA) 20 MG capsule Take 1 capsule (20 mg total) by mouth daily. 30 capsule 3   albuterol (VENTOLIN HFA) 108 (90 Base) MCG/ACT inhaler Inhale 2 puffs into the lungs every 6 (six) hours as needed for wheezing or shortness of breath. 8 g 2   amoxicillin (AMOXIL) 500 MG capsule TAKE 2 TABLETS BY MOUTH 1 HOUR PRIOR TO DENTAL APPOINTMENT AND 2 TABLETS IMMEDIATELY FOLLOWING DENTAL APPOINTMENT     apixaban (ELIQUIS) 5 MG TABS tablet Take 1 tablet (5 mg total) by mouth 2 (two) times daily. 180 tablet 1   bimatoprost (LUMIGAN) 0.01 % SOLN Place 1 drop into both eyes at bedtime.     Cholecalciferol 25 MCG (1000 UT) tablet Take 1,000 Units by mouth daily.      diclofenac Sodium (VOLTAREN) 1 % GEL Apply 2 g topically 4 (four) times daily as needed (pain).     dorzolamide-timolol (COSOPT) 22.3-6.8 MG/ML ophthalmic solution Place 1 drop into both eyes 2 (two) times daily.      dronedarone (MULTAQ) 400 MG tablet Take 1 tablet (400 mg total) by mouth 2 (two) times daily with a meal. 180 tablet 3   finasteride (PROSCAR) 5 MG tablet Take 5 mg by mouth daily after breakfast.      fluticasone (FLONASE) 50 MCG/ACT nasal spray Place 2 sprays into both nostrils 2 (two) times daily.     Fluticasone Propionate, Inhal, (FLOVENT DISKUS) 100 MCG/ACT AEPB Inhale 1 puff into the lungs 2 (two) times daily as needed. 28 each 2   Multiple Vitamin (MULTIVITAMIN WITH MINERALS) TABS tablet Take 1 tablet by mouth daily.     pantoprazole (PROTONIX) 40 MG tablet Take 1 tablet by mouth once daily 90 tablet 2   Polyethyl Glycol-Propyl Glycol (SYSTANE OP) Place 1 drop into both eyes daily as needed (dry eyes).     rosuvastatin (CRESTOR) 20 MG tablet Take 1 tablet by mouth once daily 90 tablet 2   sacubitril-valsartan (ENTRESTO) 24-26 MG Take 1 tablet by mouth 2 (two) times daily. 180 tablet 3   tadalafil (CIALIS) 5 MG tablet Take 5 mg by mouth at bedtime.      triamterene-hydrochlorothiazide (MAXZIDE-25) 37.5-25 MG tablet Take 1  tablet by mouth once daily 90 tablet 3   No current facility-administered medications for this visit.    SUMMARY OF ONCOLOGIC HISTORY: Oncology History   No history exists.    PHYSICAL EXAMINATION: ECOG PERFORMANCE STATUS: 1 - Symptomatic but completely ambulatory  Vitals:   05/19/23 0826  BP: 126/64  Pulse: 94  Resp: 18  Temp: 98.3 F (36.8 C)  SpO2: 100%   Filed Weights   05/19/23 0826  Weight: 195 lb 6.4 oz (88.6 kg)    GENERAL:alert, no distress and comfortable  NEURO: alert & oriented x 3 with fluent speech, no focal motor/sensory deficits  LABORATORY DATA:  I have reviewed the data as listed    Component Value Date/Time   NA 138 05/10/2023 1358   NA 141 07/27/2016 0748   K 4.5 05/10/2023 1358   K 3.9 07/27/2016 0748   CL 102 05/10/2023 1358   CL 104 03/23/2013 0855   CO2 22 05/10/2023 1358   CO2 30 (H) 07/27/2016 0748   GLUCOSE 100 (H) 05/10/2023  1358   GLUCOSE 93 05/06/2023 0853   GLUCOSE 122 07/27/2016 0748   GLUCOSE 98 03/23/2013 0855   BUN 23 05/10/2023 1358   BUN 16.0 07/27/2016 0748   CREATININE 1.10 05/10/2023 1358   CREATININE 1.64 (H) 05/06/2023 0853   CREATININE 1.07 10/30/2021 1557   CREATININE 1.0 07/27/2016 0748   CALCIUM 10.8 (H) 05/10/2023 1358   CALCIUM 10.5 (H) 07/27/2016 0748   PROT 7.3 05/06/2023 0853   PROT 6.6 05/21/2020 0721   PROT 7.5 07/27/2016 0748   ALBUMIN 4.5 05/06/2023 0853   ALBUMIN 4.3 05/21/2020 0721   ALBUMIN 4.3 07/27/2016 0748   AST 19 05/06/2023 0853   AST 24 07/27/2016 0748   ALT 13 05/06/2023 0853   ALT 19 07/27/2016 0748   ALKPHOS 41 05/06/2023 0853   ALKPHOS 53 07/27/2016 0748   BILITOT 0.7 05/06/2023 0853   BILITOT 0.84 07/27/2016 0748   GFRNONAA 42 (L) 05/06/2023 0853   GFRAA >60 04/14/2020 1221    No results found for: "SPEP", "UPEP"  Lab Results  Component Value Date   WBC 4.0 05/10/2023   NEUTROABS 3.5 05/06/2023   HGB 13.1 05/10/2023   HCT 39.4 05/10/2023   MCV 94 05/10/2023   PLT 150  05/10/2023      Chemistry      Component Value Date/Time   NA 138 05/10/2023 1358   NA 141 07/27/2016 0748   K 4.5 05/10/2023 1358   K 3.9 07/27/2016 0748   CL 102 05/10/2023 1358   CL 104 03/23/2013 0855   CO2 22 05/10/2023 1358   CO2 30 (H) 07/27/2016 0748   BUN 23 05/10/2023 1358   BUN 16.0 07/27/2016 0748   CREATININE 1.10 05/10/2023 1358   CREATININE 1.64 (H) 05/06/2023 0853   CREATININE 1.07 10/30/2021 1557   CREATININE 1.0 07/27/2016 0748      Component Value Date/Time   CALCIUM 10.8 (H) 05/10/2023 1358   CALCIUM 10.5 (H) 07/27/2016 0748   ALKPHOS 41 05/06/2023 0853   ALKPHOS 53 07/27/2016 0748   AST 19 05/06/2023 0853   AST 24 07/27/2016 0748   ALT 13 05/06/2023 0853   ALT 19 07/27/2016 0748   BILITOT 0.7 05/06/2023 0853   BILITOT 0.84 07/27/2016 0748       RADIOGRAPHIC STUDIES: I have personally reviewed the radiological images as listed and agreed with the findings in the report. MYOCARDIAL PERFUSION IMAGING  Result Date: 05/04/2023   Findings are consistent with a small area of ischemia in the basal inferior wall. The study is low risk.   No ST deviation was noted.   LV perfusion is abnormal. Defect 1: There is a small defect with mild reduction in uptake present in the basal inferior location(s) that is reversible. There is normal wall motion in the defect area. Consistent with ischemia.   Left ventricular function is normal. Nuclear stress EF: 62 %. The left ventricular ejection fraction is normal (55-65%). End diastolic cavity size is normal.   Prior study available for comparison from 05/29/2018.

## 2023-05-19 NOTE — Assessment & Plan Note (Signed)
He has remote history of cancer Recommend CT imaging for evaluation

## 2023-05-19 NOTE — Assessment & Plan Note (Signed)
I am concerned about abnormal night sweats, anorexia and worsening neuropathy I recommend CT imaging for metastatic cancer evaluation as this abnormal MGUS could be associated with lymphoplasmacytic disorder

## 2023-05-19 NOTE — Assessment & Plan Note (Signed)
He has history of CIDP with worsening neuropathy We discussed the risk and benefits of trial of medication to help with his symptoms so that he can get some sleep After very long discussion, he is in agreement to try Cymbalta at low-dose

## 2023-05-24 ENCOUNTER — Telehealth: Payer: Self-pay | Admitting: Hematology and Oncology

## 2023-05-24 ENCOUNTER — Ambulatory Visit (HOSPITAL_BASED_OUTPATIENT_CLINIC_OR_DEPARTMENT_OTHER)
Admission: RE | Admit: 2023-05-24 | Discharge: 2023-05-24 | Disposition: A | Discharge status: Home / Self Care | Payer: Medicare Other | Source: Ambulatory Visit | Attending: Hematology and Oncology | Admitting: Hematology and Oncology

## 2023-05-24 DIAGNOSIS — D472 Monoclonal gammopathy: Secondary | ICD-10-CM

## 2023-05-24 DIAGNOSIS — C186 Malignant neoplasm of descending colon: Secondary | ICD-10-CM | POA: Diagnosis not present

## 2023-05-24 DIAGNOSIS — R630 Anorexia: Secondary | ICD-10-CM | POA: Diagnosis not present

## 2023-05-24 MED ORDER — IOHEXOL 300 MG/ML  SOLN
80.0000 mL | Freq: Once | INTRAMUSCULAR | Status: AC | PRN
  Administered 2023-05-24: 80 mL via INTRAVENOUS

## 2023-05-24 NOTE — Telephone Encounter (Signed)
Left patient a vm regarding upcoming appointment  

## 2023-05-25 DIAGNOSIS — M5459 Other low back pain: Secondary | ICD-10-CM | POA: Diagnosis not present

## 2023-05-25 DIAGNOSIS — M542 Cervicalgia: Secondary | ICD-10-CM | POA: Diagnosis not present

## 2023-05-25 DIAGNOSIS — R2689 Other abnormalities of gait and mobility: Secondary | ICD-10-CM | POA: Diagnosis not present

## 2023-05-26 ENCOUNTER — Encounter: Payer: Self-pay | Admitting: Hematology and Oncology

## 2023-05-26 ENCOUNTER — Inpatient Hospital Stay (HOSPITAL_BASED_OUTPATIENT_CLINIC_OR_DEPARTMENT_OTHER): Payer: Medicare Other | Admitting: Hematology and Oncology

## 2023-05-26 VITALS — BP 112/60 | HR 79 | Temp 97.6°F | Resp 18 | Wt 197.0 lb

## 2023-05-26 DIAGNOSIS — D472 Monoclonal gammopathy: Secondary | ICD-10-CM | POA: Diagnosis not present

## 2023-05-26 DIAGNOSIS — C186 Malignant neoplasm of descending colon: Secondary | ICD-10-CM

## 2023-05-26 DIAGNOSIS — R61 Generalized hyperhidrosis: Secondary | ICD-10-CM | POA: Diagnosis not present

## 2023-05-26 DIAGNOSIS — G6181 Chronic inflammatory demyelinating polyneuritis: Secondary | ICD-10-CM | POA: Diagnosis not present

## 2023-05-26 DIAGNOSIS — R5382 Chronic fatigue, unspecified: Secondary | ICD-10-CM | POA: Diagnosis not present

## 2023-05-26 NOTE — Progress Notes (Signed)
[facility] OFFICE PROGRESS NOTE  Patient Care Team: Excell Seltzer, MD as PCP - General (Family Medicine) Regan Lemming, MD as PCP - Electrophysiology (Cardiology) Tonny Bollman, MD as PCP - Cardiology (Cardiology) Artis Delay, MD as Consulting Physician (Hematology and Oncology) Vilinda Flake, [facility] as Pharmacist (Pharmacist)  ASSESSMENT & PLAN:  MGUS (monoclonal gammopathy of unknown significance) CT imaging studies show no evidence of abnormal lymphadenopathy or signs of cancer We discussed the risk and benefits of bone marrow biopsy but due to lack of progression on his blood work, we decided to defer I will see him once a year  Malignant neoplasm of descending colon ([name]) There is no evidence of disease  CIDP (chronic inflammatory demyelinating polyneuropathy) He has history of CIDP with worsening neuropathy We discussed the risk and benefits of trial of medication to help with his symptoms so that he can get some sleep After very long discussion, he is in agreement to try Cymbalta at low-dose So far, he did not find Cymbalta is helpful We also discussed risk and benefits of treatment with rituximab The patient will think about  Orders Placed This Encounter  Procedures   CBC with Differential (Cancer Center Only)    Standing Status:   Future    Standing Expiration Date:   05/25/2024   CMP (Cancer Center only)    Standing Status:   Future    Standing Expiration Date:   05/25/2024   Kappa/lambda light chains    Standing Status:   Future    Standing Expiration Date:   05/25/2024   Multiple Myeloma Panel (SPEP&IFE w/QIG)    Standing Status:   Future    Standing Expiration Date:   05/25/2024    All questions were answered. The patient knows to call the clinic with any problems, questions or concerns. The total time spent in the appointment was 25 minutes encounter with patients including review of chart and various tests results, discussions about  plan of care and coordination of care plan   Artis Delay, MD 05/26/2023 1:30 PM  INTERVAL HISTORY: Please see below for problem oriented charting. he returns for review test results He tried Cymbalta He did not find it particularly helpful with neuropathy We discussed test results  REVIEW OF SYSTEMS:   Constitutional: Denies fevers, chills or abnormal weight loss Eyes: Denies blurriness of vision Ears, nose, mouth, throat, and face: Denies mucositis or sore throat Respiratory: Denies cough, dyspnea or wheezes Cardiovascular: Denies palpitation, chest discomfort or lower extremity swelling Gastrointestinal:  Denies nausea, heartburn or change in bowel habits Skin: Denies abnormal skin rashes Lymphatics: Denies new lymphadenopathy or easy bruising Behavioral/Psych: Mood is stable, no new changes  All other systems were reviewed with the patient and are negative.  I have reviewed the past medical history, past surgical history, social history and family history with the patient and they are unchanged from previous note.  ALLERGIES:  is allergic to nsaids.  MEDICATIONS:  Current Outpatient Medications  Medication Sig Dispense Refill   albuterol (VENTOLIN HFA) 108 (90 Base) MCG/ACT inhaler Inhale 2 puffs into the lungs every 6 (six) hours as needed for wheezing or shortness of breath. 8 g 2   amoxicillin (AMOXIL) 500 MG capsule TAKE 2 TABLETS BY MOUTH 1 HOUR PRIOR TO DENTAL APPOINTMENT AND 2 TABLETS IMMEDIATELY FOLLOWING DENTAL APPOINTMENT     apixaban (ELIQUIS) 5 MG TABS tablet Take 1 tablet (5 mg total) by mouth 2 (two) times daily. 180 tablet 1  bimatoprost (LUMIGAN) 0.01 % SOLN Place 1 drop into both eyes at bedtime.     Cholecalciferol 25 MCG (1000 UT) tablet Take 1,000 Units by mouth daily.      diclofenac Sodium (VOLTAREN) 1 % GEL Apply 2 g topically 4 (four) times daily as needed (pain).     dorzolamide-timolol (COSOPT) 22.3-6.8 MG/ML ophthalmic solution Place 1 drop into both  eyes 2 (two) times daily.      dronedarone (MULTAQ) 400 MG tablet Take 1 tablet (400 mg total) by mouth 2 (two) times daily with a meal. 180 tablet 3   DULoxetine (CYMBALTA) 20 MG capsule Take 1 capsule (20 mg total) by mouth daily. 30 capsule 3   finasteride (PROSCAR) 5 MG tablet Take 5 mg by mouth daily after breakfast.      fluticasone (FLONASE) 50 MCG/ACT nasal spray Place 2 sprays into both nostrils 2 (two) times daily.     Fluticasone Propionate, Inhal, (FLOVENT DISKUS) 100 MCG/ACT AEPB Inhale 1 puff into the lungs 2 (two) times daily as needed. 28 each 2   Multiple Vitamin (MULTIVITAMIN WITH MINERALS) TABS tablet Take 1 tablet by mouth daily.     pantoprazole (PROTONIX) 40 MG tablet Take 1 tablet by mouth once daily 90 tablet 2   Polyethyl Glycol-Propyl Glycol (SYSTANE OP) Place 1 drop into both eyes daily as needed (dry eyes).     rosuvastatin (CRESTOR) 20 MG tablet Take 1 tablet by mouth once daily 90 tablet 2   sacubitril-valsartan (ENTRESTO) 24-26 MG Take 1 tablet by mouth 2 (two) times daily. 180 tablet 3   tadalafil (CIALIS) 5 MG tablet Take 5 mg by mouth at bedtime.      triamterene-hydrochlorothiazide (MAXZIDE-25) 37.5-25 MG tablet Take 1 tablet by mouth once daily 90 tablet 3   No current facility-administered medications for this visit.    SUMMARY OF ONCOLOGIC HISTORY: Oncology History   No history exists.    PHYSICAL EXAMINATION: ECOG PERFORMANCE STATUS: 1 - Symptomatic but completely ambulatory  Vitals:   05/26/23 1154  BP: 112/60  Pulse: 79  Resp: 18  Temp: 97.6 F (36.4 C)  SpO2: 100%   Filed Weights   05/26/23 1154  Weight: 197 lb (89.4 kg)    GENERAL:alert, no distress and comfortable NEURO: alert & oriented x 3 with fluent speech, no focal motor/sensory deficits  LABORATORY DATA:  I have reviewed the data as listed    Component Value Date/Time   NA 138 05/10/2023 1358   NA 141 07/27/2016 0748   K 4.5 05/10/2023 1358   K 3.9 07/27/2016 0748    CL 102 05/10/2023 1358   CL 104 03/23/2013 0855   CO2 22 05/10/2023 1358   CO2 30 (H) 07/27/2016 0748   GLUCOSE 100 (H) 05/10/2023 1358   GLUCOSE 93 05/06/2023 0853   GLUCOSE 122 07/27/2016 0748   GLUCOSE 98 03/23/2013 0855   BUN 23 05/10/2023 1358   BUN 16.0 07/27/2016 0748   CREATININE 1.10 05/10/2023 1358   CREATININE 1.64 (H) 05/06/2023 0853   CREATININE 1.07 10/30/2021 1557   CREATININE 1.0 07/27/2016 0748   CALCIUM 10.8 (H) 05/10/2023 1358   CALCIUM 10.5 (H) 07/27/2016 0748   PROT 7.3 05/06/2023 0853   PROT 6.6 05/21/2020 0721   PROT 7.5 07/27/2016 0748   ALBUMIN 4.5 05/06/2023 0853   ALBUMIN 4.3 05/21/2020 0721   ALBUMIN 4.3 07/27/2016 0748   AST 19 05/06/2023 0853   AST 24 07/27/2016 0748   ALT 13 05/06/2023 0853  ALT 19 07/27/2016 0748   ALKPHOS 41 05/06/2023 0853   ALKPHOS 53 07/27/2016 0748   BILITOT 0.7 05/06/2023 0853   BILITOT 0.84 07/27/2016 0748   GFRNONAA 42 (L) 05/06/2023 0853   GFRAA >60 04/14/2020 1221    No results found for: "SPEP", "UPEP"  Lab Results  Component Value Date   WBC 4.0 05/10/2023   NEUTROABS 3.5 05/06/2023   HGB 13.1 05/10/2023   HCT 39.4 05/10/2023   MCV 94 05/10/2023   PLT 150 05/10/2023      Chemistry      Component Value Date/Time   NA 138 05/10/2023 1358   NA 141 07/27/2016 0748   K 4.5 05/10/2023 1358   K 3.9 07/27/2016 0748   CL 102 05/10/2023 1358   CL 104 03/23/2013 0855   CO2 22 05/10/2023 1358   CO2 30 (H) 07/27/2016 0748   BUN 23 05/10/2023 1358   BUN 16.0 07/27/2016 0748   CREATININE 1.10 05/10/2023 1358   CREATININE 1.64 (H) 05/06/2023 0853   CREATININE 1.07 10/30/2021 1557   CREATININE 1.0 07/27/2016 0748      Component Value Date/Time   CALCIUM 10.8 (H) 05/10/2023 1358   CALCIUM 10.5 (H) 07/27/2016 0748   ALKPHOS 41 05/06/2023 0853   ALKPHOS 53 07/27/2016 0748   AST 19 05/06/2023 0853   AST 24 07/27/2016 0748   ALT 13 05/06/2023 0853   ALT 19 07/27/2016 0748   BILITOT 0.7 05/06/2023 0853    BILITOT 0.84 07/27/2016 0748       RADIOGRAPHIC STUDIES: I have personally reviewed the radiological images as listed and agreed with the findings in the report. CT CHEST ABDOMEN PELVIS W CONTRAST  Result Date: 05/25/2023 CLINICAL DATA:  MGUS. Nine sweats. Anorexia. Metastatic disease evaluation. * Tracking Code: BO * EXAM: CT CHEST, ABDOMEN, AND PELVIS WITH CONTRAST TECHNIQUE: Multidetector CT imaging of the chest, abdomen and pelvis was performed following the standard protocol during bolus administration of intravenous contrast. RADIATION DOSE REDUCTION: This exam was performed according to the departmental dose-optimization program which includes automated exposure control, adjustment of the mA and/or kV according to patient size and/or use of iterative reconstruction technique. 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 Acute findings Mediastinum/Nodes: No axillary or supraclavicular adenopathy. No mediastinal or hilar adenopathy. No pericardial fluid. Esophagus normal. Lungs/Pleura: With several scattered calcified pulmonary nodules. No new or suspicious pulmonary nodules. Musculoskeletal: No lytic or expansile lesion identified. CT ABDOMEN AND PELVIS FINDINGS Hepatobiliary: No focal hepatic lesion. No biliary ductal dilatation. Gallbladder is normal. Common bile duct is normal. Pancreas: Pancreas is normal. No ductal dilatation. No pancreatic inflammation. Spleen: Normal spleen Adrenals/urinary tract: Adrenal glands and kidneys are normal. The ureters and bladder normal. Stomach/Bowel: Stomach, small bowel, appendix, and cecum are normal. The colon and rectosigmoid colon are normal. Vascular/Lymphatic: Abdominal aorta is normal caliber with atherosclerotic calcification. There is no retroperitoneal or periportal lymphadenopathy. No pelvic lymphadenopathy. Reproductive: Prostate  enlarged. Other: No soft tissue plasmacytoma. Musculoskeletal: No lytic or expansile lesion. Sclerotic lesion in the RIGHT iliac bone measuring 19 mm (97/2) is unchanged from CT exam 11/13/2018. Degenerative osteophytosis of the spine. IMPRESSION: CHEST IMPRESSION: 1. No evidence of malignancy in the thorax. 2. 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 3. Post CABG anatomy. PELVIS IMPRESSION: 1. No evidence of malignancy in the abdomen pelvis. 2. No lytic or expansile lesion in the skeleton. 3.  No soft tissue plasmacytoma. 4. Stable sclerotic lesion in the RIGHT iliac bone. 5.  Aortic Atherosclerosis (ICD10-I70.0). Electronically Signed   By: Genevive Bi M.D.   On: 05/25/2023 16:14   MYOCARDIAL PERFUSION IMAGING  Result Date: 05/04/2023   Findings are consistent with a small area of ischemia in the basal inferior wall. The study is low risk.   No ST deviation was noted.   LV perfusion is abnormal. Defect 1: There is a small defect with mild reduction in uptake present in the basal inferior location(s) that is reversible. There is normal wall motion in the defect area. Consistent with ischemia.   Left ventricular function is normal. Nuclear stress EF: 62 %. The left ventricular ejection fraction is normal (55-65%). End diastolic cavity size is normal.   Prior study available for comparison from 05/29/2018.

## 2023-05-26 NOTE — Assessment & Plan Note (Signed)
He has history of CIDP with worsening neuropathy We discussed the risk and benefits of trial of medication to help with his symptoms so that he can get some sleep After very long discussion, he is in agreement to try Cymbalta at low-dose So far, he did not find Cymbalta is helpful We also discussed risk and benefits of treatment with rituximab The patient will think about

## 2023-05-26 NOTE — Assessment & Plan Note (Signed)
CT imaging studies show no evidence of abnormal lymphadenopathy or signs of cancer We discussed the risk and benefits of bone marrow biopsy but due to lack of progression on his blood work, we decided to defer I will see him once a year

## 2023-05-26 NOTE — Assessment & Plan Note (Signed)
There is no evidence of disease

## 2023-05-30 DIAGNOSIS — M5459 Other low back pain: Secondary | ICD-10-CM | POA: Diagnosis not present

## 2023-05-30 DIAGNOSIS — M542 Cervicalgia: Secondary | ICD-10-CM | POA: Diagnosis not present

## 2023-05-30 DIAGNOSIS — R2689 Other abnormalities of gait and mobility: Secondary | ICD-10-CM | POA: Diagnosis not present

## 2023-06-02 DIAGNOSIS — M5459 Other low back pain: Secondary | ICD-10-CM | POA: Diagnosis not present

## 2023-06-02 DIAGNOSIS — R2689 Other abnormalities of gait and mobility: Secondary | ICD-10-CM | POA: Diagnosis not present

## 2023-06-02 DIAGNOSIS — M542 Cervicalgia: Secondary | ICD-10-CM | POA: Diagnosis not present

## 2023-06-07 DIAGNOSIS — M5459 Other low back pain: Secondary | ICD-10-CM | POA: Diagnosis not present

## 2023-06-07 DIAGNOSIS — M542 Cervicalgia: Secondary | ICD-10-CM | POA: Diagnosis not present

## 2023-06-07 DIAGNOSIS — R2689 Other abnormalities of gait and mobility: Secondary | ICD-10-CM | POA: Diagnosis not present

## 2023-06-22 DIAGNOSIS — R2689 Other abnormalities of gait and mobility: Secondary | ICD-10-CM | POA: Diagnosis not present

## 2023-06-22 DIAGNOSIS — M5459 Other low back pain: Secondary | ICD-10-CM | POA: Diagnosis not present

## 2023-06-22 DIAGNOSIS — M542 Cervicalgia: Secondary | ICD-10-CM | POA: Diagnosis not present

## 2023-06-24 ENCOUNTER — Ambulatory Visit: Payer: Medicare Other | Attending: Cardiology

## 2023-06-24 DIAGNOSIS — I442 Atrioventricular block, complete: Secondary | ICD-10-CM | POA: Diagnosis not present

## 2023-06-24 DIAGNOSIS — M542 Cervicalgia: Secondary | ICD-10-CM | POA: Diagnosis not present

## 2023-06-24 DIAGNOSIS — R2689 Other abnormalities of gait and mobility: Secondary | ICD-10-CM | POA: Diagnosis not present

## 2023-06-24 DIAGNOSIS — M5459 Other low back pain: Secondary | ICD-10-CM | POA: Diagnosis not present

## 2023-06-27 DIAGNOSIS — M542 Cervicalgia: Secondary | ICD-10-CM | POA: Diagnosis not present

## 2023-06-27 DIAGNOSIS — R2689 Other abnormalities of gait and mobility: Secondary | ICD-10-CM | POA: Diagnosis not present

## 2023-06-27 DIAGNOSIS — M5459 Other low back pain: Secondary | ICD-10-CM | POA: Diagnosis not present

## 2023-06-28 LAB — CUP PACEART REMOTE DEVICE CHECK
Battery Remaining Longevity: 115 mo
Battery Voltage: 3.05 V
Brady Statistic AP VP Percent: 97.93 %
Brady Statistic AP VS Percent: 0.01 %
Brady Statistic AS VP Percent: 2.06 %
Brady Statistic AS VS Percent: 0 %
Brady Statistic RA Percent Paced: 63.2 %
Brady Statistic RV Percent Paced: 99.98 %
Date Time Interrogation Session: 20240628165142
Implantable Lead Connection Status: 753985
Implantable Lead Connection Status: 753985
Implantable Lead Connection Status: 753985
Implantable Lead Implant Date: 20170517
Implantable Lead Implant Date: 20170517
Implantable Lead Implant Date: 20230928
Implantable Lead Location: 753858
Implantable Lead Location: 753859
Implantable Lead Location: 753860
Implantable Lead Model: 4398
Implantable Lead Model: 5076
Implantable Lead Model: 5076
Implantable Pulse Generator Implant Date: 20230928
Lead Channel Impedance Value: 304 Ohm
Lead Channel Impedance Value: 304 Ohm
Lead Channel Impedance Value: 304 Ohm
Lead Channel Impedance Value: 304 Ohm
Lead Channel Impedance Value: 342 Ohm
Lead Channel Impedance Value: 361 Ohm
Lead Channel Impedance Value: 361 Ohm
Lead Channel Impedance Value: 380 Ohm
Lead Channel Impedance Value: 513 Ohm
Lead Channel Impedance Value: 513 Ohm
Lead Channel Impedance Value: 513 Ohm
Lead Channel Impedance Value: 551 Ohm
Lead Channel Impedance Value: 570 Ohm
Lead Channel Impedance Value: 570 Ohm
Lead Channel Pacing Threshold Amplitude: 0.625 V
Lead Channel Pacing Threshold Amplitude: 0.875 V
Lead Channel Pacing Threshold Amplitude: 0.875 V
Lead Channel Pacing Threshold Pulse Width: 0.4 ms
Lead Channel Pacing Threshold Pulse Width: 0.4 ms
Lead Channel Pacing Threshold Pulse Width: 0.6 ms
Lead Channel Sensing Intrinsic Amplitude: 1.5 mV
Lead Channel Sensing Intrinsic Amplitude: 1.5 mV
Lead Channel Setting Pacing Amplitude: 1.5 V
Lead Channel Setting Pacing Amplitude: 1.5 V
Lead Channel Setting Pacing Amplitude: 2 V
Lead Channel Setting Pacing Pulse Width: 0.4 ms
Lead Channel Setting Pacing Pulse Width: 0.6 ms
Lead Channel Setting Sensing Sensitivity: 1.2 mV
Zone Setting Status: 755011
Zone Setting Status: 755011

## 2023-06-29 ENCOUNTER — Encounter: Payer: Self-pay | Admitting: Cardiology

## 2023-07-04 ENCOUNTER — Encounter (HOSPITAL_COMMUNITY): Payer: Self-pay | Admitting: Internal Medicine

## 2023-07-04 ENCOUNTER — Ambulatory Visit (HOSPITAL_COMMUNITY)
Admission: RE | Admit: 2023-07-04 | Discharge: 2023-07-04 | Disposition: A | Discharge status: Home / Self Care | Payer: Medicare Other | Source: Ambulatory Visit | Attending: Internal Medicine | Admitting: Internal Medicine

## 2023-07-04 VITALS — BP 114/82 | HR 62 | Ht 69.0 in | Wt 193.4 lb

## 2023-07-04 DIAGNOSIS — Z951 Presence of aortocoronary bypass graft: Secondary | ICD-10-CM | POA: Diagnosis not present

## 2023-07-04 DIAGNOSIS — I480 Paroxysmal atrial fibrillation: Secondary | ICD-10-CM | POA: Insufficient documentation

## 2023-07-04 DIAGNOSIS — I10 Essential (primary) hypertension: Secondary | ICD-10-CM | POA: Insufficient documentation

## 2023-07-04 DIAGNOSIS — Z955 Presence of coronary angioplasty implant and graft: Secondary | ICD-10-CM | POA: Diagnosis not present

## 2023-07-04 DIAGNOSIS — Z79899 Other long term (current) drug therapy: Secondary | ICD-10-CM | POA: Insufficient documentation

## 2023-07-04 DIAGNOSIS — I252 Old myocardial infarction: Secondary | ICD-10-CM | POA: Diagnosis not present

## 2023-07-04 DIAGNOSIS — I447 Left bundle-branch block, unspecified: Secondary | ICD-10-CM | POA: Insufficient documentation

## 2023-07-04 DIAGNOSIS — I110 Hypertensive heart disease with heart failure: Secondary | ICD-10-CM | POA: Diagnosis not present

## 2023-07-04 DIAGNOSIS — D6869 Other thrombophilia: Secondary | ICD-10-CM | POA: Diagnosis not present

## 2023-07-04 DIAGNOSIS — Z950 Presence of cardiac pacemaker: Secondary | ICD-10-CM | POA: Insufficient documentation

## 2023-07-04 DIAGNOSIS — Z7901 Long term (current) use of anticoagulants: Secondary | ICD-10-CM | POA: Insufficient documentation

## 2023-07-04 DIAGNOSIS — I2510 Atherosclerotic heart disease of native coronary artery without angina pectoris: Secondary | ICD-10-CM | POA: Diagnosis not present

## 2023-07-04 NOTE — Progress Notes (Signed)
Primary Care Physician: Excell Seltzer, MD Primary Cardiologist: Tonny Bollman, MD Electrophysiologist: Regan Lemming, MD     Referring Physician: Dr. Jannet Askew Derrick [name] is a 82 y.o. male with a history of CAD s/p inferior MI in 2012 with DES to RCA, s/p CABG 2020, SSS s/p PPM with upgrade to CRT-P in 2023, HTN, HLD, NSVT, pulmonary sarcoid, MGUS, colon cancer, chronic inflammatory demyelinating polyneuropathy, and paroxysmal atrial fibrillation who presents for consultation in the [facility]. Patient called on 7/3 noting arrhythmia since 6/11 and was advised to come off Multaq. Patient is on Eliquis 5 mg BID for a CHADS2VASC score of 6.  On evaluation today, he is currently in AV dual paced rhythm. He did not end up stopping the Multaq. Patient states he felt he went back into normal rhythm while waiting for return call on what to do. He admits that persistent AF likely attributable to mountain trip where he drank more wine, more coffee, and ate out a lot more than usual. He stopped drinking wine as of 7/3 and switched to one cup of decaf. He has not had Afib since then.    Today, he denies symptoms of palpitations, chest pain, shortness of breath, orthopnea, PND, lower extremity edema, dizziness, presyncope, syncope, snoring, daytime somnolence, bleeding, or neurologic sequela. The patient is tolerating medications without difficulties and is otherwise without complaint today.   he has a BMI of Body mass index is 28.56 kg/m.[name] Filed Weights   07/04/23 0815  Weight: 87.7 kg    Current Outpatient Medications  Medication Sig Dispense Refill   albuterol (VENTOLIN HFA) 108 (90 Base) MCG/ACT inhaler Inhale 2 puffs into the lungs every 6 (six) hours as needed for wheezing or shortness of breath. 8 g 2   amoxicillin (AMOXIL) 500 MG capsule TAKE 2 TABLETS BY MOUTH 1 HOUR PRIOR TO DENTAL APPOINTMENT AND 2 TABLETS IMMEDIATELY FOLLOWING DENTAL  APPOINTMENT     apixaban (ELIQUIS) 5 MG TABS tablet Take 1 tablet (5 mg total) by mouth 2 (two) times daily. 180 tablet 1   bimatoprost (LUMIGAN) 0.01 % SOLN Place 1 drop into both eyes at bedtime.     Cholecalciferol 25 MCG (1000 UT) tablet Take 1,000 Units by mouth daily.      diclofenac Sodium (VOLTAREN) 1 % GEL Apply 2 g topically 4 (four) times daily as needed (pain).     dorzolamide-timolol (COSOPT) 22.3-6.8 MG/ML ophthalmic solution Place 1 drop into both eyes 2 (two) times daily.      dronedarone (MULTAQ) 400 MG tablet Take 1 tablet (400 mg total) by mouth 2 (two) times daily with a meal. 180 tablet 3   finasteride (PROSCAR) 5 MG tablet Take 5 mg by mouth daily after breakfast.      fluticasone (FLONASE) 50 MCG/ACT nasal spray Place 2 sprays into both nostrils 2 (two) times daily.     Fluticasone Propionate, Inhal, (FLOVENT DISKUS) 100 MCG/ACT AEPB Inhale 1 puff into the lungs 2 (two) times daily as needed. (Patient taking differently: Inhale 1 puff into the lungs as needed.) 28 each 2   Multiple Vitamin (MULTIVITAMIN WITH MINERALS) TABS tablet Take 1 tablet by mouth daily.     pantoprazole (PROTONIX) 40 MG tablet Take 1 tablet by mouth once daily 90 tablet 2   Polyethyl Glycol-Propyl Glycol (SYSTANE OP) Place 1 drop into both eyes daily as needed (dry eyes).     rosuvastatin (CRESTOR) 20 MG tablet Take  1 tablet by mouth once daily 90 tablet 2   sacubitril-valsartan (ENTRESTO) 24-26 MG Take 1 tablet by mouth 2 (two) times daily. 180 tablet 3   tadalafil (CIALIS) 5 MG tablet Take 5 mg by mouth at bedtime.      triamterene-hydrochlorothiazide (MAXZIDE-25) 37.5-25 MG tablet Take 1 tablet by mouth once daily 90 tablet 3   DULoxetine (CYMBALTA) 20 MG capsule Take 1 capsule (20 mg total) by mouth daily. (Patient not taking: Reported on 07/04/2023) 30 capsule 3   No current facility-administered medications for this encounter.    Atrial Fibrillation Management history:  Previous antiarrhythmic  drugs: Multaq Previous cardioversions:  Previous ablations:  Anticoagulation history: Eliquis   ROS- All systems are reviewed and negative except as per the HPI above.  Physical Exam: Ht 5\' 9"  (1.753 m)   Wt 87.7 kg   BMI 28.56 kg/m   GEN: Well nourished, well developed in no acute distress NECK: No JVD; No carotid bruits CARDIAC: Regular rate and rhythm, no murmurs, rubs, gallops RESPIRATORY:  Clear to auscultation without rales, wheezing or rhonchi  ABDOMEN: Soft, non-tender, non-distended EXTREMITIES:  No edema; No deformity   EKG today demonstrates  Vent. rate 62 BPM PR interval * ms QRS duration 166 ms QT/QTcB 480/487 ms P-R-T axes * -69 96 Wide QRS rhythm Left axis deviation Non-specific intra-ventricular conduction block Minimal voltage criteria for LVH, may be normal variant ( Cornell product ) Abnormal ECG When compared with ECG of 23-Sep-2022 16:09, PREVIOUS ECG IS PRESENT  Echo 12/01/22 demonstrated:  1. The aortic valve is tricuspid. There is severe calcifcation of the  aortic valve. There is severe thickening of the aortic valve. Aortic valve  regurgitation is not visualized. Moderate aortic valve stenosis. Aortic  valve area, by VTI measures 1.04  cm. Aortic valve mean gradient measures 28.0 mmHg. Aortic valve Vmax  measures 3.21 m/s.   2. Left ventricular ejection fraction, by estimation, is 60 to 65%. The  left ventricle has normal function. The left ventricle has no regional  wall motion abnormalities. Left ventricular diastolic parameters are  consistent with Grade II diastolic  dysfunction (pseudonormalization). The average left ventricular global  longitudinal strain is -21.9 %. The global longitudinal strain is normal.   3. Right ventricular systolic function is normal. The right ventricular  size is mildly enlarged. There is normal pulmonary artery systolic  pressure. The estimated right ventricular systolic pressure is 26.0 mmHg.   4. Left  atrial size was mild to moderately dilated.   5. Right atrial size was mildly dilated.   6. The mitral valve is grossly normal. Mild mitral valve regurgitation.  No evidence of mitral stenosis.   7. The inferior vena cava is normal in size with greater than 50%  respiratory variability, suggesting right atrial pressure of 3 mmHg.    ASSESSMENT & PLAN CHA2DS2-VASc Score = 6  The patient's score is based upon: CHF History: 1 HTN History: 1 Diabetes History: 1 Stroke History: 0 Vascular Disease History: 1 Age Score: 2 Gender Score: 0       ASSESSMENT AND PLAN: Paroxysmal Atrial Fibrillation (ICD10:  I48.0) The patient's CHA2DS2-VASc score is 6, indicating a 9.7% annual risk of stroke.    He is in NSR today. Continue current management of Multaq (he never stopped medication). It appears he identified his triggers and has since remained without Afib. If were to have increased burden, we discussed treatment such as Tikosyn or ablation. He would have to transition from Maxzide if  chose Tikosyn.  Secondary Hypercoagulable State (ICD10:  D68.69) The patient is at significant risk for stroke/thromboembolism based upon his CHA2DS2-VASc Score of 6.  Continue Apixaban (Eliquis).  No missed doses.    Follow up Afib clinic prn.   Lake Bells, PA-C  [facility] [address] [place], [place] [address] [phone]

## 2023-07-07 NOTE — Progress Notes (Signed)
Remote pacemaker transmission.   

## 2023-07-12 ENCOUNTER — Other Ambulatory Visit: Payer: Self-pay | Admitting: Cardiovascular Disease

## 2023-07-18 ENCOUNTER — Ambulatory Visit: Payer: Medicare Other | Attending: Cardiovascular Disease | Admitting: Cardiovascular Disease

## 2023-07-18 ENCOUNTER — Encounter: Payer: Self-pay | Admitting: Cardiovascular Disease

## 2023-07-18 VITALS — BP 114/74 | HR 79 | Ht 69.0 in | Wt 190.8 lb

## 2023-07-18 DIAGNOSIS — I495 Sick sinus syndrome: Secondary | ICD-10-CM

## 2023-07-18 DIAGNOSIS — I480 Paroxysmal atrial fibrillation: Secondary | ICD-10-CM | POA: Diagnosis not present

## 2023-07-18 DIAGNOSIS — I25810 Atherosclerosis of coronary artery bypass graft(s) without angina pectoris: Secondary | ICD-10-CM

## 2023-07-18 DIAGNOSIS — I5032 Chronic diastolic (congestive) heart failure: Secondary | ICD-10-CM | POA: Diagnosis not present

## 2023-07-18 DIAGNOSIS — I350 Nonrheumatic aortic (valve) stenosis: Secondary | ICD-10-CM

## 2023-07-18 DIAGNOSIS — E782 Mixed hyperlipidemia: Secondary | ICD-10-CM

## 2023-07-18 NOTE — Patient Instructions (Signed)
Medication Instructions:  Your physician recommends that you continue on your current medications as directed. Please refer to the Current Medication list given to you today.  *If you need a refill on your cardiac medications before your next appointment, please call your pharmacy*   Lab Work: NONE If you have labs (blood work) drawn today and your tests are completely normal, you will receive your results only by: MyChart Message (if you have MyChart) OR A paper copy in the mail If you have any lab test that is abnormal or we need to change your treatment, we will call you to review the results.   Testing/Procedures: NONE   Follow-Up: At [facility], you and your health needs are our priority.  As part of our continuing mission to provide you with exceptional heart care, we have created designated Provider Care Teams.  These Care Teams include your primary Cardiologist (physician) and Advanced Practice Providers (APPs -  Physician Assistants and Nurse Practitioners) who all work together to provide you with the care you need, when you need it.  We recommend signing up for the patient portal called "MyChart".  Sign up information is provided on this After Visit Summary.  MyChart is used to connect with patients for Virtual Visits (Telemedicine).  Patients are able to view lab/test results, encounter notes, upcoming appointments, etc.  Non-urgent messages can be sent to your provider as well.   To learn more about what you can do with MyChart, go to [email or web address].    Your next appointment:   6 month(s)  Provider:   [name], MD   

## 2023-07-18 NOTE — Progress Notes (Signed)
Cardiology Office Note:    Date:  07/18/2023   ID:  Derrick, [name] 1941-05-07, MRN [id number]  PCP:  Excell Seltzer, MD   [facility] Providers Cardiologist:  Tonny Bollman, MD Electrophysiologist:  Regan Lemming, MD     Referring MD: Excell Seltzer, MD   Chief Complaint  Patient presents with   Atrial Fibrillation    History of Present Illness:    Derrick [name] is a 82 y.o. 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  He is doing pretty well at present.  He walks with his wife 30 minutes each day with no exertional symptoms.  He had a period of atrial fibrillation in June where he was out of rhythm for several days.  He noticed this on his device transmission.  At that time he experienced some exercise intolerance and shortness of breath.  He has had a localized discomfort and an area of the left chest that is not brought on by physical activity or exertion.  He otherwise is doing well with no recent heart palpitations, edema, lightheadedness, or other symptoms.  Past Medical History:  Diagnosis Date   Anemia    iron deficiency anemia- infusion on 02/15/2020 and 02/22/2020   BENIGN PROSTATIC HYPERTROPHY, WITH OBSTRUCTION 05/28/2010   CAD (coronary artery disease)    a. s/p NSTEMI 06/01/11: DES to RCA;  b. cath 06/25/11:   dLM 50-60% (FFR 0.87), prox to mid LAD 40-50%, D1 50%, pCFX 50%, RCA stent ok, dPDA 80%, EF 55-60%.  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   DISH (diffuse idiopathic skeletal hyperostosis) 12/02/2015   Diverticulitis    GENERALIZED OSTEOARTHROSIS UNSPECIFIED SITE 05/28/2010   GERD 05/28/2010   Heart murmur    History of gout    History of kidney stones    HYPERLIPIDEMIA 05/28/2010   Hypertension    IgM lambda paraproteinemia    Intrinsic asthma, 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   "associated w/lung sarcoidosis"   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   MITRAL VALVE PROLAPSE 05/28/2010   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   NSTEMI (non-ST elevated myocardial infarction) ([name]) 06/03/11   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   OSA on CPAP 05/28/2010   PALPITATIONS, CHRONIC 05/28/2010   Parotid gland pain 2010   infection   Peripheral neuropathy    "tx'd w/targeted chemo" (05/12/2016)   Pneumonia 2-3 times   Presence of permanent cardiac pacemaker    Medtronic   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   Sarcoidosis    pulmonalis   UNSPECIFIED INFLAMMATORY  AND TOXIC NEUROPATHY 05/28/2010   Vision loss    VITAMIN D DEFICIENCY 05/28/2010    Past Surgical History:  Procedure Laterality Date   APPENDECTOMY  1954   age 58   BIV UPGRADE N/A 09/23/2022   Procedure: BIV PACEMAKER UPGRADE;  Surgeon: Regan Lemming, MD;  Location: [facility] INVASIVE CV LAB;  Service: Cardiovascular;  Laterality: N/A;   CATARACT EXTRACTION W/ INTRAOCULAR LENS IMPLANT Left    COLON SURGERY  2000   decending colon    CORONARY ANGIOPLASTY WITH STENT PLACEMENT  06/03/11   CORONARY ARTERY  BYPASS GRAFT N/A 06/05/2019   Procedure: CORONARY ARTERY BYPASS GRAFTING (CABG) TIMES THREE USING LEFT INTERNAL MAMMARY ARTERY AND RIGHT GREATER SEPHANOUS VEIN;  Surgeon: Kerin Perna, MD;  Location: [facility] OR;  Service: Open Heart Surgery;  Laterality: N/A;   CORONARY PRESSURE/FFR STUDY N/A 05/15/2019   Procedure: INTRAVASCULAR PRESSURE WIRE/FFR STUDY;  Surgeon: Tonny Bollman, MD;  Location: [facility] INVASIVE CV LAB;  Service: Cardiovascular;  Laterality: N/A;   ELECTROPHYSIOLOGIC STUDY N/A 05/12/2016   Procedure: Cardioversion;  Surgeon: Will Jorja Loa, MD;  Location: [facility] INVASIVE CV LAB;  Service: Cardiovascular;  Laterality: N/A;   EP IMPLANTABLE DEVICE N/A 05/12/2016   Procedure: Pacemaker Implant;  Surgeon: Will Jorja Loa, MD;  Location: [facility] INVASIVE CV LAB;  Service: Cardiovascular;  Laterality: N/A;   EYE SURGERY     bilateral cataract with lens implant   HERNIA REPAIR  01/2019   left inguinal hernia repair   INGUINAL HERNIA REPAIR Left 02/21/2019   Procedure: LEFT INGUINAL HERNIA REPAIR WITH MESH;  Surgeon: Harriette Bouillon, MD;  Location: [facility] OR;  Service: General;  Laterality: Left;   INSERT / REPLACE / REMOVE PACEMAKER  05/12/2016   KNEE ARTHROSCOPY Right 2003   LEFT HEART CATH AND CORONARY ANGIOGRAPHY N/A 05/15/2019   Procedure: LEFT HEART CATH AND CORONARY ANGIOGRAPHY;  Surgeon: Tonny Bollman, MD;  Location: [facility] INVASIVE CV LAB;  Service: Cardiovascular;  Laterality: N/A;   LEFT HEART CATHETERIZATION WITH CORONARY ANGIOGRAM N/A 03/31/2015   Procedure: LEFT HEART CATHETERIZATION WITH CORONARY ANGIOGRAM;  Surgeon: Tonny Bollman, MD;  Location: [facility] CATH LAB;  Service: Cardiovascular;  Laterality: N/A;   LUNG SURGERY  2001   "open lung dissection"   MOHS SURGERY Left ~ 2008   ear   PROSTATE BIOPSY  2010   TEE WITHOUT CARDIOVERSION N/A 06/05/2019   Procedure: TRANSESOPHAGEAL ECHOCARDIOGRAM (TEE);  Surgeon: Donata Clay, Theron Arista, MD;  Location: [facility] OR;  Service: Open Heart Surgery;  Laterality:  N/A;   TONSILLECTOMY  1950   TOTAL KNEE ARTHROPLASTY Right 03/11/2020   Procedure: TOTAL KNEE ARTHROPLASTY;  Surgeon: Durene Romans, MD;  Location: [facility] ORS;  Service: Orthopedics;  Laterality: Right;  70 mins    Current Medications: Current Meds  Medication Sig   albuterol (VENTOLIN HFA) 108 (90 Base) MCG/ACT inhaler Inhale 2 puffs into the lungs every 6 (six) hours as needed for wheezing or shortness of breath.   amoxicillin (AMOXIL) 500 MG capsule TAKE 2 TABLETS BY MOUTH 1 HOUR PRIOR TO DENTAL APPOINTMENT AND 2 TABLETS IMMEDIATELY FOLLOWING DENTAL APPOINTMENT   apixaban (ELIQUIS) 5 MG TABS tablet Take 1 tablet (5 mg total) by mouth 2 (two) times daily.   bimatoprost (LUMIGAN) 0.01 % SOLN Place 1 drop into both eyes at bedtime.   Cholecalciferol 25 MCG (1000 UT) tablet Take 1,000 Units by mouth daily.    diclofenac Sodium (VOLTAREN) 1 % GEL Apply 2 g topically 4 (four) times daily as needed (pain).  dorzolamide-timolol (COSOPT) 22.3-6.8 MG/ML ophthalmic solution Place 1 drop into both eyes 2 (two) times daily.    dronedarone (MULTAQ) 400 MG tablet Take 1 tablet (400 mg total) by mouth 2 (two) times daily with a meal.   finasteride (PROSCAR) 5 MG tablet Take 5 mg by mouth daily after breakfast.    fluticasone (FLONASE) 50 MCG/ACT nasal spray Place 2 sprays into both nostrils 2 (two) times daily.   Fluticasone Propionate, Inhal, (FLOVENT DISKUS) 100 MCG/ACT AEPB Inhale 1 puff into the lungs 2 (two) times daily as needed. (Patient taking differently: Inhale 1 puff into the lungs as needed.)   Multiple Vitamin (MULTIVITAMIN WITH MINERALS) TABS tablet Take 1 tablet by mouth daily.   pantoprazole (PROTONIX) 40 MG tablet Take 1 tablet by mouth once daily   Polyethyl Glycol-Propyl Glycol (SYSTANE OP) Place 1 drop into both eyes daily as needed (dry eyes).   rosuvastatin (CRESTOR) 20 MG tablet Take 1 tablet by mouth once daily   sacubitril-valsartan (ENTRESTO) 24-26 MG Take 1 tablet by mouth 2 (two)  times daily.   tadalafil (CIALIS) 5 MG tablet Take 5 mg by mouth at bedtime.    triamterene-hydrochlorothiazide (MAXZIDE-25) 37.5-25 MG tablet Take 1 tablet by mouth once daily     Allergies:   Nsaids   Social History   Socioeconomic History   Marital status: Married    Spouse name: Not on file   Number of children: 2   Years of education: MS-Coll.   Highest education level: Not on file  Occupational History   Occupation: Consultant    Comment: Arts development officer  Tobacco Use   Smoking status: Never   Smokeless tobacco: Never  Vaping Use   Vaping status: Never Used  Substance and Sexual Activity   Alcohol use: Yes    Comment: occasional   Drug use: No   Sexual activity: Yes    Birth control/protection: None  Other Topics Concern   Not on file  Social History Narrative   College- [employer]; [employer]-environment science and mgt. Married [name])  "78" 1 son- 70; 1 dtr "33" 2 grandchildren. 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 End of life-provided discussive context and provided packet.   Social Determinants of Health   Financial Resource Strain: Low Risk  (10/29/2022)   Overall Financial Resource Strain (CARDIA)    Difficulty of Paying Living Expenses: Not hard at all  Food Insecurity: No Food Insecurity (10/29/2022)   Hunger Vital Sign    Worried About Running Out of Food in the Last Year: Never true    Ran Out of Food in the Last Year: Never true  Transportation Needs: No Transportation Needs (10/29/2022)   PRAPARE - Administrator, Civil Service (Medical): No    Lack of Transportation (Non-Medical): No  Physical Activity: Sufficiently Active (10/29/2022)   Exercise Vital Sign    Days of Exercise per Week: 7 days    Minutes of Exercise per Session: 50 min  Stress: No Stress Concern Present (10/29/2022)   [employer] of Occupational Health - Occupational Stress Questionnaire    Feeling of Stress : Not at all  Social Connections:  Socially Integrated (10/29/2022)   Social Connection and Isolation Panel [NHANES]    Frequency of Communication with Friends and Family: More than three times a week    Frequency of Social Gatherings with Friends and Family: More than three times a week    Attends Religious Services: More than 4 times per year    Active  Member of Clubs or Organizations: Yes    Attends Engineer, structural: More than 4 times per year    Marital Status: Married     Family History: The patient's family history includes Arrhythmia (age of onset: 50) in his brother; Arthritis in his mother; Cancer in his brother; Colon polyps in his mother; Congestive Heart Failure in his brother; Coronary artery disease in his brother and mother; Heart attack in his brother; Heart disease in his mother; Sleep apnea in his brother, father, and mother.  ROS:   Please see the history of present illness.    All other systems reviewed and are negative.  EKGs/Labs/Other Studies Reviewed:    The following studies were reviewed today:  EKG Interpretation Date/Time:  Monday July 18 2023 08:46:45 EDT Ventricular Rate:  60 PR Interval:    QRS Duration:  160 QT Interval:  484 QTC Calculation: 484 R Axis:   268  Text Interpretation: V-paced rhythm 60 bpm Confirmed by Tonny Bollman [phone]) on 07/18/2023 8:57:14 AM    Recent Labs: 05/06/2023: ALT 13 05/10/2023: BUN 23; Creatinine, Ser 1.10; Hemoglobin 13.1; Platelets 150; Potassium 4.5; Sodium 138  Recent Lipid Panel    Component Value Date/Time   CHOL 136 11/02/2022 1039   CHOL 147 05/21/2020 0721   TRIG 45.0 11/02/2022 1039   HDL 68.60 11/02/2022 1039   HDL 66 05/21/2020 0721   CHOLHDL 2 11/02/2022 1039   VLDL 9.0 11/02/2022 1039   LDLCALC 59 11/02/2022 1039   LDLCALC 99 10/30/2021 1557     Risk Assessment/Calculations:    CHA2DS2-VASc Score = 6   This indicates a 9.7% annual risk of stroke. The patient's score is based upon: CHF History: 1 HTN History:  1 Diabetes History: 1 Stroke History: 0 Vascular Disease History: 1 Age Score: 2 Gender Score: 0               Physical Exam:    VS:  BP 114/74   Pulse 79   Ht 5\' 9"  (1.753 m)   Wt 190 lb 12.8 oz (86.5 kg)   SpO2 98%   BMI 28.18 kg/m     Wt Readings from Last 3 Encounters:  07/18/23 190 lb 12.8 oz (86.5 kg)  07/04/23 193 lb 6.4 oz (87.7 kg)  05/26/23 197 lb (89.4 kg)     GEN:  Well nourished, well developed in no acute distress HEENT: Normal NECK: No JVD; No carotid bruits LYMPHATICS: No lymphadenopathy CARDIAC: RRR, 2/6 harsh mid peaking crescendo decrescendo murmur loudest at the left lower sternal border, A2 is present RESPIRATORY:  Clear to auscultation without rales, wheezing or rhonchi  ABDOMEN: Soft, non-tender, non-distended MUSCULOSKELETAL:  No edema; No deformity  SKIN: Warm and dry NEUROLOGIC:  Alert and oriented x 3 PSYCHIATRIC:  Normal affect   ASSESSMENT:    1. Coronary artery disease involving coronary bypass graft of native heart without angina pectoris   2. Paroxysmal atrial fibrillation ([name])   3. Moderate aortic stenosis   4. Chronic heart failure with preserved ejection fraction ([name])   5. Sick sinus syndrome ([name])   6. Mixed hyperlipidemia    PLAN:    In order of problems listed above:  The patient appears clinically stable with atypical angina but no exertional symptoms.  He is not on antiplatelet therapy because of chronic oral anticoagulation. Continue current management.  Followed by Dr. Elberta Fortis.  Limiting alcohol and caffeine, continuing on Multaq. Most recent echo reviewed and all findings are consistent with moderate  aortic stenosis.  His exam is also consistent with moderate aortic stenosis.  Follow-up echo as scheduled in November.  Reviewed cardinal symptoms of aortic stenosis again with the patient.  I will see him back in 6 months for follow-up. LVEF improved and normalized with CRT.  Most recent echo reviewed as above.   Continue Entresto. Status post permanent pacemaker, now CRT Treated with rosuvastatin.  Last LDL cholesterol 59.           Medication Adjustments/Labs and Tests Ordered: Current medicines are reviewed at length with the patient today.  Concerns regarding medicines are outlined above.  Orders Placed This Encounter  Procedures   EKG 12-Lead   No orders of the defined types were placed in this encounter.   Patient Instructions  Medication Instructions:  Your physician recommends that you continue on your current medications as directed. Please refer to the Current Medication list given to you today.  *If you need a refill on your cardiac medications before your next appointment, please call your pharmacy*   Lab Work: NONE If you have labs (blood work) drawn today and your tests are completely normal, you will receive your results only by: MyChart Message (if you have MyChart) OR A paper copy in the mail If you have any lab test that is abnormal or we need to change your treatment, we will call you to review the results.   Testing/Procedures: NONE   Follow-Up: At [facility], you and your health needs are our priority.  As part of our continuing mission to provide you with exceptional heart care, we have created designated Provider Care Teams.  These Care Teams include your primary Cardiologist (physician) and Advanced Practice Providers (APPs -  Physician Assistants and Nurse Practitioners) who all work together to provide you with the care you need, when you need it.  We recommend signing up for the patient portal called "MyChart".  Sign up information is provided on this After Visit Summary.  MyChart is used to connect with patients for Virtual Visits (Telemedicine).  Patients are able to view lab/test results, encounter notes, upcoming appointments, etc.  Non-urgent messages can be sent to your provider as well.   To learn more about what you can do with MyChart, go  to [email or web address].    Your next appointment:   6 month(s)  Provider:   Tonny Bollman, MD        Signed, Tonny Bollman, MD  07/18/2023 1:07 PM    [facility]

## 2023-08-16 ENCOUNTER — Encounter: Payer: Self-pay | Admitting: Adult Health

## 2023-08-17 ENCOUNTER — Encounter: Payer: Self-pay | Admitting: Family Medicine

## 2023-08-17 DIAGNOSIS — G4733 Obstructive sleep apnea (adult) (pediatric): Secondary | ICD-10-CM

## 2023-08-17 NOTE — Telephone Encounter (Signed)
Pt placed on wait list. Received this message from [facility]:  New, Maryella Shivers, Otilio Jefferson, RN; Alain Honey; Jeris Penta, Melissa; 1 other Good Morning Derrick [name],  We will need ov notes showing why the new study is being completed. As long as we have that it should be okay to just do the study. If patient needs anything in between they will need the OV [id number] note showing usage and benefit.  Thank you,  Luellen Pucker

## 2023-08-17 NOTE — Telephone Encounter (Signed)
Last seen November 2023. Initial machine setup was on 12/09/2017. Next appointment is November 2024. I have reached out to [employer] to see if appt is needed sooner than November and can we go ahead and get SS done then order new machine.

## 2023-08-21 NOTE — Progress Notes (Unsigned)
08/22/23- 82 yoM never smoker for sleep evaluation - OSA on CPAP Medical problem list includes- PVTach, PAFib, CAD/CABG/ MI/Pacemaker,  HTN, Asthma, Allergic Rhinitis, Colon Cancer, GERD, MGUS, Neuropathy, Osteoarthritis, DISH, DDD, BPH, PulmonarySarcoid, Glaucoma, Overweight,  CPAP from  [facility]/ Dr Vickey Huger- motor life exceeded NPSG split night [facility]/ [facility] 11/28/17- AHI 33.3/ hr, desaturation to 79%, weight 194 lbs, to CPAP 9 CPAP [facility] new 12/09/17 Epworth score- Body weight today-198 lbs He has used CPAP over 20 years and says he can't sleep without it.  Thinks upper pressure was 15. Got recent resupply message from [employer]. We reached out to [employer] but unable to get download. His Sarcoid was managed in [place], [place] and he thinks it is inactive. If it should cause future problems, he wanted to have a Pulmonary office to turn to. Mild intermittent asthma is managed with occ use of albuterol hfa and Flovent diskus. Hx ENT surgery for tonsils. He suspects possible nasal fx as child- narrower on Left.  Prior to Admission medications   Medication Sig Start Date End Date Taking? Authorizing Provider  albuterol (VENTOLIN HFA) 108 (90 Base) MCG/ACT inhaler Inhale 2 puffs into the lungs every 6 (six) hours as needed for wheezing or shortness of breath. 12/12/19  Yes Bedsole, Amy E, MD  apixaban (ELIQUIS) 5 MG TABS tablet Take 1 tablet (5 mg total) by mouth 2 (two) times daily. 04/21/23  Yes Gaston Islam., NP  bimatoprost (LUMIGAN) 0.01 % SOLN Place 1 drop into both eyes at bedtime.   Yes [provider]  Cholecalciferol 25 MCG (1000 UT) tablet Take 1,000 Units by mouth daily.    Yes [provider]  diclofenac Sodium (VOLTAREN) 1 % GEL Apply 2 g topically 4 (four) times daily as needed (pain).   Yes [provider]  dorzolamide-timolol (COSOPT) 22.3-6.8 MG/ML ophthalmic solution Place 1 drop into both eyes 2 (two) times daily.  09/30/15  Yes [provider]   dronedarone (MULTAQ) 400 MG tablet Take 1 tablet (400 mg total) by mouth 2 (two) times daily with a meal. 10/20/22  Yes Camnitz, Will Daphine Deutscher, MD  finasteride (PROSCAR) 5 MG tablet Take 5 mg by mouth daily after breakfast.  10/13/12  Yes [provider]  fluticasone (FLONASE) 50 MCG/ACT nasal spray Place 2 sprays into both nostrils 2 (two) times daily.   Yes [provider]  Fluticasone Propionate, Inhal, (FLOVENT DISKUS) 100 MCG/ACT AEPB Inhale 1 puff into the lungs 2 (two) times daily as needed. Patient taking differently: Inhale 1 puff into the lungs as needed. 11/16/21  Yes Bedsole, Amy E, MD  Multiple Vitamin (MULTIVITAMIN WITH MINERALS) TABS tablet Take 1 tablet by mouth daily.   Yes [provider]  pantoprazole (PROTONIX) 40 MG tablet Take 1 tablet by mouth once daily 04/11/23  Yes Tonny Bollman, MD  Polyethyl Glycol-Propyl Glycol (SYSTANE OP) Place 1 drop into both eyes daily as needed (dry eyes).   Yes [provider]  rosuvastatin (CRESTOR) 20 MG tablet Take 1 tablet by mouth once daily 04/11/23  Yes Tonny Bollman, MD  sacubitril-valsartan (ENTRESTO) 24-26 MG Take 1 tablet by mouth 2 (two) times daily. 03/21/23  Yes Tonny Bollman, MD  tadalafil (CIALIS) 5 MG tablet Take 5 mg by mouth at bedtime.    Yes [provider]  triamterene-hydrochlorothiazide (MAXZIDE-25) 37.5-25 MG tablet Take 1 tablet by mouth once daily 07/12/23  Yes Tonny Bollman, MD  amoxicillin (AMOXIL) 500 MG capsule TAKE 2 TABLETS BY MOUTH 1 HOUR PRIOR  TO DENTAL APPOINTMENT AND 2 TABLETS IMMEDIATELY FOLLOWING DENTAL APPOINTMENT Patient not taking: Reported on 08/22/2023 09/07/22   [provider]   Past Medical History:  Diagnosis Date   Anemia    iron deficiency anemia- infusion on 02/15/2020 and 02/22/2020   BENIGN PROSTATIC HYPERTROPHY, WITH OBSTRUCTION 05/28/2010   CAD (coronary artery disease)    a. s/p NSTEMI 06/01/11: DES to RCA;  b. cath 06/25/11:   dLM 50-60%  (FFR 0.87), prox to mid LAD 40-50%, D1 50%, pCFX 50%, RCA stent ok, dPDA 80%, EF 55-60%.  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   DISH (diffuse idiopathic skeletal hyperostosis) 12/02/2015   Diverticulitis    GENERALIZED OSTEOARTHROSIS UNSPECIFIED SITE 05/28/2010   GERD 05/28/2010   Heart murmur    History of gout    History of kidney stones    HYPERLIPIDEMIA 05/28/2010   Hypertension    IgM lambda paraproteinemia    Intrinsic asthma, 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   "associated w/lung sarcoidosis"   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   MITRAL VALVE PROLAPSE 05/28/2010   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   NSTEMI (non-ST elevated myocardial infarction) ([name]) 06/03/11   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   OSA on CPAP 05/28/2010   PALPITATIONS, CHRONIC 05/28/2010   Parotid gland pain 2010   infection   Peripheral neuropathy    "tx'd w/targeted chemo" (05/12/2016)   Pneumonia 2-3 times   Presence of permanent cardiac pacemaker    Medtronic   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   Sarcoidosis    pulmonalis   UNSPECIFIED INFLAMMATORY AND TOXIC NEUROPATHY 05/28/2010   Vision loss    VITAMIN D DEFICIENCY 05/28/2010   Past Surgical History:  Procedure Laterality Date   APPENDECTOMY  1954   age 59   BIV UPGRADE N/A 09/23/2022   Procedure: BIV PACEMAKER UPGRADE;  Surgeon: Regan Lemming, MD;  Location: [facility] INVASIVE CV LAB;  Service: Cardiovascular;  Laterality: N/A;   CATARACT  EXTRACTION W/ INTRAOCULAR LENS IMPLANT Left    COLON SURGERY  2000   decending colon    CORONARY ANGIOPLASTY WITH STENT PLACEMENT  06/03/11   CORONARY ARTERY BYPASS GRAFT N/A 06/05/2019   Procedure: CORONARY ARTERY BYPASS GRAFTING (CABG) TIMES THREE USING LEFT INTERNAL MAMMARY ARTERY AND RIGHT GREATER SEPHANOUS VEIN;  Surgeon: Kerin Perna, MD;  Location[facility];  Service: Open Heart Surgery;  Laterality: N/A;   CORONARY PRESSURE/FFR STUDY N/A 05/15/2019   Procedure: INTRAVASCULAR PRESSURE WIRE/FFR STUDY;  Surgeon: Tonny Bollman, MD;  Location[facility] INVASIVE CV LAB;  Service: Cardiovascular;  Laterality: N/A;   ELECTROPHYSIOLOGIC STUDY N/A 05/12/2016   Procedure: Cardioversion;  Surgeon: Will Jorja Loa, MD;  Location: [facility] INVASIVE CV LAB;  Service: Cardiovascular;  Laterality: N/A;   EP IMPLANTABLE DEVICE N/A 05/12/2016   Procedure: Pacemaker Implant;  Surgeon: Will Jorja Loa, MD;  Location: [facility] INVASIVE CV LAB;  Service: Cardiovascular;  Laterality: N/A;   EYE SURGERY     bilateral cataract with lens  implant   HERNIA REPAIR  01/2019   left inguinal hernia repair   INGUINAL HERNIA REPAIR Left 02/21/2019   Procedure: LEFT INGUINAL HERNIA REPAIR WITH MESH;  Surgeon: Harriette Bouillon, MD;  Location: [facility] OR;  Service: General;  Laterality: Left;   INSERT / REPLACE / REMOVE PACEMAKER  05/12/2016   KNEE ARTHROSCOPY Right 2003   LEFT HEART CATH AND CORONARY ANGIOGRAPHY N/A 05/15/2019   Procedure: LEFT HEART CATH AND CORONARY ANGIOGRAPHY;  Surgeon: Tonny Bollman, MD;  Location: [facility] INVASIVE CV LAB;  Service: Cardiovascular;  Laterality: N/A;   LEFT HEART CATHETERIZATION WITH CORONARY ANGIOGRAM N/A 03/31/2015   Procedure: LEFT HEART CATHETERIZATION WITH CORONARY ANGIOGRAM;  Surgeon: Tonny Bollman, MD;  Location: [facility] CATH LAB;  Service: Cardiovascular;  Laterality: N/A;   LUNG SURGERY  2001   "open lung dissection"   MOHS SURGERY Left ~ 2008   ear   PROSTATE BIOPSY  2010   TEE WITHOUT CARDIOVERSION  N/A 06/05/2019   Procedure: TRANSESOPHAGEAL ECHOCARDIOGRAM (TEE);  Surgeon: Donata Clay, Theron Arista, MD;  Location: [facility] OR;  Service: Open Heart Surgery;  Laterality: N/A;   TONSILLECTOMY  1950   TOTAL KNEE ARTHROPLASTY Right 03/11/2020   Procedure: TOTAL KNEE ARTHROPLASTY;  Surgeon: Durene Romans, MD;  Location: [facility] ORS;  Service: Orthopedics;  Laterality: Right;  70 mins   Family History  Problem Relation Age of Onset   Arthritis Mother        severe   Coronary artery disease Mother    Colon polyps Mother    Heart disease Mother    Sleep apnea Mother    Sleep apnea Father    Coronary artery disease Brother    Cancer Brother        choriocarcinoma   Arrhythmia Brother 2       Afib/Tachycardia   Heart attack Brother    Congestive Heart Failure Brother    Sleep apnea Brother    Social History   Socioeconomic History   Marital status: Married    Spouse name: Not on file   Number of children: 2   Years of education: MS-Coll.   Highest education level: Not on file  Occupational History   Occupation: Consultant    Comment: Arts development officer  Tobacco Use   Smoking status: Never   Smokeless tobacco: Never  Vaping Use   Vaping status: Never Used  Substance and Sexual Activity   Alcohol use: Yes    Comment: occasional   Drug use: No   Sexual activity: Yes    Birth control/protection: None  Other Topics Concern   Not on file  Social History Narrative   College- [employer]; [employer]-environment science and mgt. Married [name])  "2" 1 son- 77; 1 dtr "21" 2 grandchildren. 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 End of life-provided discussive context and provided packet.   Social Determinants of Health   Financial Resource Strain: Low Risk  (10/29/2022)   Overall Financial Resource Strain (CARDIA)    Difficulty of Paying Living Expenses: Not hard at all  Food Insecurity: No Food Insecurity (10/29/2022)   Hunger Vital Sign    Worried About Running Out of Food in the  Last Year: Never true    Ran Out of Food in the Last Year: Never true  Transportation Needs: No Transportation Needs (10/29/2022)   PRAPARE - Administrator, Civil Service (Medical): No    Lack of Transportation (Non-Medical): No  Physical Activity: Sufficiently Active (10/29/2022)   Exercise Vital Sign  Days of Exercise per Week: 7 days    Minutes of Exercise per Session: 50 min  Stress: No Stress Concern Present (10/29/2022)   [employer] of Occupational Health - Occupational Stress Questionnaire    Feeling of Stress : Not at all  Social Connections: Socially Integrated (10/29/2022)   Social Connection and Isolation Panel [NHANES]    Frequency of Communication with Friends and Family: More than three times a week    Frequency of Social Gatherings with Friends and Family: More than three times a week    Attends Religious Services: More than 4 times per year    Active Member of [employer] or Organizations: Yes    Attends Engineer, structural: More than 4 times per year    Marital Status: Married  Catering manager Violence: Not At Risk (10/29/2022)   Humiliation, Afraid, Rape, and Kick questionnaire    Fear of Current or Ex-Partner: No    Emotionally Abused: No    Physically Abused: No    Sexually Abused: No   ROS-see HPI   + = positive Constitutional:    weight loss, night sweats, fevers, chills, fatigue, lassitude. HEENT:    headaches, difficulty swallowing, tooth/dental problems, sore throat,       sneezing, itching, ear ache, nasal congestion, post nasal drip, snoring CV:    chest pain, orthopnea, PND, swelling in lower extremities, anasarca,                    dizziness, palpitations Resp:   shortness of breath with exertion or at rest.                productive cough,   non-productive cough, coughing up of blood.              change in color of mucus.  wheezing.   Skin:    rash or lesions. GI:  No-   heartburn, indigestion, abdominal pain, nausea, vomiting,  diarrhea,                 change in bowel habits, loss of appetite GU: dysuria, change in color of urine, no urgency or frequency.   flank pain. MS:   joint pain, stiffness, decreased range of motion, back pain. Neuro-     nothing unusual Psych:  change in mood or affect.  depression or anxiety.   memory loss.  OBJ- Physical Exam General- Alert, Oriented, Affect-appropriate, Distress- none acute Skin- rash-none, lesions- none, excoriation- none Lymphadenopathy- none Head- atraumatic            Eyes- Gross vision intact, PERRLA, conjunctivae and secretions clear            Ears- Hearing, canals-normal            Nose- Clear, no-Septal dev, mucus, polyps, erosion, perforation             Throat- Mallampati II , mucosa clear , drainage- none, tonsils- atrophic Neck- flexible , trachea midline, no stridor , thyroid nl, carotid no bruit Chest - symmetrical excursion , unlabored           Heart/CV- RRR , no murmur , no gallop  , no rub, nl s1 s2                           - JVD- none , edema- none, stasis changes- none, varices- none           Lung- clear to  P&A, wheeze- none, cough- none , dullness-none, rub- none           Chest wall-  Abd-  Br/ Gen/ Rectal- Not done, not indicated Extrem- cyanosis- none, clubbing, none, atrophy- none, strength- nl Neuro- grossly intact to observation

## 2023-08-22 ENCOUNTER — Ambulatory Visit (INDEPENDENT_AMBULATORY_CARE_PROVIDER_SITE_OTHER): Payer: Medicare Other | Admitting: Internal Medicine

## 2023-08-22 ENCOUNTER — Encounter: Payer: Self-pay | Admitting: Internal Medicine

## 2023-08-22 VITALS — BP 136/80 | HR 86 | Ht 69.0 in | Wt 198.2 lb

## 2023-08-22 DIAGNOSIS — D869 Sarcoidosis, unspecified: Secondary | ICD-10-CM | POA: Diagnosis not present

## 2023-08-22 DIAGNOSIS — J4521 Mild intermittent asthma with (acute) exacerbation: Secondary | ICD-10-CM

## 2023-08-22 DIAGNOSIS — G4733 Obstructive sleep apnea (adult) (pediatric): Secondary | ICD-10-CM | POA: Diagnosis not present

## 2023-08-22 NOTE — Patient Instructions (Addendum)
Order- DME [employer]- please replace old CPAP machine, auto 5-15, mask of choice, humidifier, supplies, Airview/ card  Please call if we can help

## 2023-08-23 NOTE — Assessment & Plan Note (Signed)
Benefits from CPAP. Ddue for replacement of old machine. Plan- replace old CPAP auto 5-15

## 2023-08-23 NOTE — Assessment & Plan Note (Signed)
Clinically burned out- long term remission Plan- future CXR as indicated

## 2023-09-01 ENCOUNTER — Encounter: Payer: Self-pay | Admitting: Family Medicine

## 2023-09-01 DIAGNOSIS — Z23 Encounter for immunization: Secondary | ICD-10-CM | POA: Diagnosis not present

## 2023-09-13 DIAGNOSIS — M25561 Pain in right knee: Secondary | ICD-10-CM | POA: Insufficient documentation

## 2023-09-13 DIAGNOSIS — Z96651 Presence of right artificial knee joint: Secondary | ICD-10-CM | POA: Diagnosis not present

## 2023-09-22 ENCOUNTER — Telehealth: Payer: Self-pay | Admitting: Internal Medicine

## 2023-09-22 NOTE — Telephone Encounter (Signed)
Inbound call from patient, states he is having pains in his side and also changes in bowel habits. Patient states he is a former colon cancer survivor is is worried and concerned with these symptoms. Would like to discuss with a nurse.

## 2023-09-23 ENCOUNTER — Other Ambulatory Visit: Payer: Self-pay | Admitting: Family Medicine

## 2023-09-23 ENCOUNTER — Ambulatory Visit (INDEPENDENT_AMBULATORY_CARE_PROVIDER_SITE_OTHER): Payer: Medicare Other

## 2023-09-23 DIAGNOSIS — I495 Sick sinus syndrome: Secondary | ICD-10-CM | POA: Diagnosis not present

## 2023-09-23 NOTE — Telephone Encounter (Signed)
Pt states he has noticed recent bowel changes, has watery diarrhea. Also c/o pain under his ribcage and has had limited appetite. He has hx of colon cancer and wonders if he needs to have a colonoscopy sooner than recall date. Pt scheduled to see Hyacinth Meeker PA 09/27/23 at 9am. Pt aware of appt.

## 2023-09-24 LAB — CUP PACEART REMOTE DEVICE CHECK
Battery Remaining Longevity: 114 mo
Battery Voltage: 3.02 V
Brady Statistic RA Percent Paced: 0.44 %
Brady Statistic RV Percent Paced: 99.98 %
Date Time Interrogation Session: 20240926233339
Implantable Lead Connection Status: 753985
Implantable Lead Connection Status: 753985
Implantable Lead Connection Status: 753985
Implantable Lead Implant Date: 20170517
Implantable Lead Implant Date: 20170517
Implantable Lead Implant Date: 20230928
Implantable Lead Location: 753858
Implantable Lead Location: 753859
Implantable Lead Location: 753860
Implantable Lead Model: 4398
Implantable Lead Model: 5076
Implantable Lead Model: 5076
Implantable Pulse Generator Implant Date: 20230928
Lead Channel Impedance Value: 285 Ohm
Lead Channel Impedance Value: 285 Ohm
Lead Channel Impedance Value: 304 Ohm
Lead Channel Impedance Value: 323 Ohm
Lead Channel Impedance Value: 323 Ohm
Lead Channel Impedance Value: 342 Ohm
Lead Channel Impedance Value: 361 Ohm
Lead Channel Impedance Value: 399 Ohm
Lead Channel Impedance Value: 494 Ohm
Lead Channel Impedance Value: 494 Ohm
Lead Channel Impedance Value: 494 Ohm
Lead Channel Impedance Value: 532 Ohm
Lead Channel Impedance Value: 532 Ohm
Lead Channel Impedance Value: 551 Ohm
Lead Channel Pacing Threshold Amplitude: 0.625 V
Lead Channel Pacing Threshold Amplitude: 0.875 V
Lead Channel Pacing Threshold Amplitude: 1 V
Lead Channel Pacing Threshold Pulse Width: 0.4 ms
Lead Channel Pacing Threshold Pulse Width: 0.4 ms
Lead Channel Pacing Threshold Pulse Width: 0.6 ms
Lead Channel Sensing Intrinsic Amplitude: 0.625 mV
Lead Channel Sensing Intrinsic Amplitude: 0.625 mV
Lead Channel Setting Pacing Amplitude: 1.5 V
Lead Channel Setting Pacing Amplitude: 1.5 V
Lead Channel Setting Pacing Amplitude: 2 V
Lead Channel Setting Pacing Pulse Width: 0.4 ms
Lead Channel Setting Pacing Pulse Width: 0.6 ms
Lead Channel Setting Sensing Sensitivity: 1.2 mV
Zone Setting Status: 755011
Zone Setting Status: 755011

## 2023-09-25 ENCOUNTER — Encounter: Payer: Self-pay | Admitting: Cardiology

## 2023-09-26 NOTE — Telephone Encounter (Signed)
Patient called advised to STOP multaq and recommends AF clinic. Patient voiced understanding and agreeable.

## 2023-09-27 ENCOUNTER — Other Ambulatory Visit: Payer: Medicare Other

## 2023-09-27 ENCOUNTER — Ambulatory Visit (INDEPENDENT_AMBULATORY_CARE_PROVIDER_SITE_OTHER): Payer: Medicare Other | Admitting: Physician Assistant

## 2023-09-27 ENCOUNTER — Telehealth: Payer: Self-pay | Admitting: *Deleted

## 2023-09-27 ENCOUNTER — Encounter: Payer: Self-pay | Admitting: Physician Assistant

## 2023-09-27 VITALS — BP 138/66 | HR 79 | Ht 69.0 in | Wt 190.0 lb

## 2023-09-27 DIAGNOSIS — R194 Change in bowel habit: Secondary | ICD-10-CM

## 2023-09-27 DIAGNOSIS — R197 Diarrhea, unspecified: Secondary | ICD-10-CM | POA: Diagnosis not present

## 2023-09-27 DIAGNOSIS — I4891 Unspecified atrial fibrillation: Secondary | ICD-10-CM

## 2023-09-27 DIAGNOSIS — R110 Nausea: Secondary | ICD-10-CM | POA: Diagnosis not present

## 2023-09-27 DIAGNOSIS — R1012 Left upper quadrant pain: Secondary | ICD-10-CM | POA: Diagnosis not present

## 2023-09-27 HISTORY — DX: Unspecified atrial fibrillation: I48.91

## 2023-09-27 MED ORDER — PANTOPRAZOLE SODIUM 40 MG PO TBEC: 40.0000 mg | DELAYED_RELEASE_TABLET | Freq: Two times a day (BID) | ORAL | 5 refills | Status: DC

## 2023-09-27 NOTE — Progress Notes (Signed)
Chief Complaint: Bloating, fullness and diarrhea  HPI:    Derrick [name] is an 82 year old male, known to Dr. Rhea Belton, with a past medical history as listed below including CAD, colon cancer, colon polyps, CAD and aortic stenosis on Eliquis (aortic valve with severe calcification, valve area measures 1.04 cm, V-max 3.73M/S, LVEF 60-65%), COPD, GERD and multiple others, who was referred to me by Excell Seltzer, MD for a complaint of change in bowel habits to diarrhea, bloating and fullness.    11/28/2020 EGD and colonoscopy.  Colonoscopy done for high risk of colon cancer given a personal history of colon cancer, adenomatous and sessile serrated polyps with 5 polyps from 3 to 5 mm in size throughout the colon and a patent end-to-end colocolonic anastomosis and internal hemorrhoids.  Pathology showed mixture of hyperplastic and tubular adenomas. Repeat colonoscopy recommended in 3 years.  EGD with an erythematous polyp and a few benign gastric polyps, thickened mucosa in the antrum and prepyloric region thought to be a healed ulcer.  Pathology showed fundic gland polyp.    05/10/2023 CBC normal, BMP with an elevated glucose and otherwise normal.    05/25/2023 CTAP/chest with contrast done for night sweats, anorexia and question of metastatic disease with no evidence of malignancy, stable calcified pulmonary nodules consistent with benign granulomatous disease, post CABG anatomy, stable sclerotic lesion in the right iliac bone.    Today, the patient tells me that over the past 2 months he has had a change in bowel habits.  Tells me he takes a 30-minute walk every morning and afterwards now he will have an urgent explosive watery bowel movement and if he has another throughout the day it is also soft, no solid firm stools anymore.  Along with this has noticed nausea in the morning that does not quite make him want to vomit but makes him feel full and have no appetite.  He forces himself to eat.  Continues his  Pantoprazole 40 mg in the morning.  Also occasional pain under the left side of his rib cage, apparently this was constant until the past couple of days, now it comes and goes and is a dull ache rated as a 4-5/10 unchanged by movement or a bowel movement.  Patient is nervous given his history and would like his colonoscopy moved up.    Also tells me his pacemaker has been saying he is in constant A-fib for the past 90 days, going to the A-fib clinic on 06/29/2023 for further workup.    Denies fever, chills, weight loss, blood in his stool, vomiting or symptoms that awaken him from sleep.    Past Medical History:  Diagnosis Date   Anemia    iron deficiency anemia- infusion on 02/15/2020 and 02/22/2020   BENIGN PROSTATIC HYPERTROPHY, WITH OBSTRUCTION 05/28/2010   CAD (coronary artery disease)    a. s/p NSTEMI 06/01/11: DES to RCA;  b. cath 06/25/11:   dLM 50-60% (FFR 0.87), prox to mid LAD 40-50%, D1 50%, pCFX 50%, RCA stent ok, dPDA 80%, EF 55-60%.  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   DISH (diffuse idiopathic skeletal hyperostosis) 12/02/2015   Diverticulitis    GENERALIZED OSTEOARTHROSIS UNSPECIFIED SITE 05/28/2010   GERD 05/28/2010   Heart murmur    History of gout    History of kidney stones    HYPERLIPIDEMIA 05/28/2010   Hypertension    IgM lambda paraproteinemia    Intrinsic asthma, 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   "associated w/lung sarcoidosis"   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   MITRAL VALVE PROLAPSE  05/28/2010   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   NSTEMI (non-ST elevated myocardial infarction) ([name]) 06/03/11   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   OSA on CPAP 05/28/2010   PALPITATIONS, CHRONIC 05/28/2010   Parotid gland pain 2010   infection   Peripheral neuropathy    "tx'd w/targeted chemo" (05/12/2016)   Pneumonia 2-3 times   Presence of permanent cardiac pacemaker    [employer]   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   Sarcoidosis    pulmonalis   UNSPECIFIED INFLAMMATORY AND TOXIC NEUROPATHY 05/28/2010   Vision loss    VITAMIN D DEFICIENCY 05/28/2010    Past Surgical History:  Procedure Laterality Date   APPENDECTOMY  1954   age 66   BIV UPGRADE N/A 09/23/2022   Procedure: BIV PACEMAKER UPGRADE;  Surgeon: Regan Lemming, MD;  Location: [facility] INVASIVE CV LAB;  Service: Cardiovascular;  Laterality: N/A;   CATARACT EXTRACTION W/ INTRAOCULAR LENS IMPLANT Left    COLON SURGERY  2000   decending colon    CORONARY ANGIOPLASTY WITH STENT PLACEMENT  06/03/11   CORONARY ARTERY BYPASS GRAFT N/A 06/05/2019   Procedure: CORONARY ARTERY BYPASS GRAFTING (CABG) TIMES THREE USING LEFT INTERNAL MAMMARY ARTERY AND RIGHT GREATER SEPHANOUS VEIN;  Surgeon: Kerin Perna, MD;  Location: [facility] OR;  Service: Open Heart Surgery;  Laterality: N/A;   CORONARY PRESSURE/FFR STUDY N/A 05/15/2019   Procedure: INTRAVASCULAR PRESSURE WIRE/FFR STUDY;  Surgeon: Tonny Bollman, MD;  Location: [facility] INVASIVE CV LAB;  Service: Cardiovascular;  Laterality: N/A;   ELECTROPHYSIOLOGIC STUDY N/A 05/12/2016   Procedure: Cardioversion;  Surgeon: Will Jorja Loa, MD;  Location: [facility] INVASIVE CV LAB;  Service: Cardiovascular;  Laterality: N/A;   EP IMPLANTABLE DEVICE N/A 05/12/2016   Procedure: Pacemaker Implant;  Surgeon: Will Jorja Loa, MD;  Location: [facility] INVASIVE CV LAB;  Service: Cardiovascular;  Laterality: N/A;   EYE SURGERY     bilateral cataract with lens implant   HERNIA REPAIR  01/2019   left  inguinal hernia repair   INGUINAL HERNIA REPAIR Left 02/21/2019   Procedure: LEFT INGUINAL HERNIA REPAIR WITH MESH;  Surgeon: Harriette Bouillon, MD;  Location: [facility] OR;  Service: General;  Laterality: Left;   INSERT / REPLACE / REMOVE PACEMAKER  05/12/2016   KNEE ARTHROSCOPY Right 2003   LEFT HEART CATH AND CORONARY ANGIOGRAPHY N/A 05/15/2019   Procedure: LEFT HEART CATH AND CORONARY ANGIOGRAPHY;  Surgeon: Tonny Bollman, MD;  Location: [facility] INVASIVE CV LAB;  Service: Cardiovascular;  Laterality: N/A;   LEFT HEART CATHETERIZATION WITH CORONARY ANGIOGRAM N/A 03/31/2015   Procedure: LEFT HEART CATHETERIZATION WITH CORONARY ANGIOGRAM;  Surgeon: Tonny Bollman, MD;  Location: [facility] CATH LAB;  Service: Cardiovascular;  Laterality: N/A;   LUNG SURGERY  2001   "open lung dissection"   MOHS SURGERY Left ~ 2008   ear   PROSTATE BIOPSY  2010   TEE WITHOUT CARDIOVERSION N/A 06/05/2019   Procedure: TRANSESOPHAGEAL ECHOCARDIOGRAM (  TEE);  Surgeon: Kerin Perna, MD;  [facility] OR;  Service: Open Heart Surgery;  Laterality: N/A;   TONSILLECTOMY  1950   TOTAL KNEE ARTHROPLASTY Right 03/11/2020   Procedure: TOTAL KNEE ARTHROPLASTY;  Surgeon: Durene Romans, MD;  Location: [facility] ORS;  Service: Orthopedics;  Laterality: Right;  70 mins    Current Outpatient Medications  Medication Sig Dispense Refill   albuterol (VENTOLIN HFA) 108 (90 Base) MCG/ACT inhaler INHALE 2 PUFFS BY MOUTH EVERY 6 HOURS AS NEEDED FOR WHEEZING FOR SHORTNESS OF BREATH 9 g 0   amoxicillin (AMOXIL) 500 MG capsule TAKE 2 TABLETS BY MOUTH 1 HOUR PRIOR TO DENTAL APPOINTMENT AND 2 TABLETS IMMEDIATELY FOLLOWING DENTAL APPOINTMENT     apixaban (ELIQUIS) 5 MG TABS tablet Take 1 tablet (5 mg total) by mouth 2 (two) times daily. 180 tablet 1   bimatoprost (LUMIGAN) 0.01 % SOLN Place 1 drop into both eyes at bedtime.     Cholecalciferol 25 MCG (1000 UT) tablet Take 1,000 Units by mouth daily.      diclofenac Sodium (VOLTAREN) 1 % GEL Apply 2 g topically 4  (four) times daily as needed (pain).     dorzolamide-timolol (COSOPT) 22.3-6.8 MG/ML ophthalmic solution Place 1 drop into both eyes 2 (two) times daily.      dronedarone (MULTAQ) 400 MG tablet Take 400 mg by mouth 2 (two) times daily with a meal.     finasteride (PROSCAR) 5 MG tablet Take 5 mg by mouth daily after breakfast.      fluticasone (FLONASE) 50 MCG/ACT nasal spray Place 2 sprays into both nostrils 2 (two) times daily.     Fluticasone Propionate, Inhal, (FLUTICASONE PROPIONATE DISKUS) 100 MCG/ACT AEPB INHALE 1 PUFF BY MOUTH TWICE DAILY AS NEEDED 60 each 0   Multiple Vitamin (MULTIVITAMIN WITH MINERALS) TABS tablet Take 1 tablet by mouth daily.     pantoprazole (PROTONIX) 40 MG tablet Take 1 tablet by mouth once daily 90 tablet 2   Polyethyl Glycol-Propyl Glycol (SYSTANE OP) Place 1 drop into both eyes daily as needed (dry eyes).     rosuvastatin (CRESTOR) 20 MG tablet Take 1 tablet by mouth once daily 90 tablet 2   sacubitril-valsartan (ENTRESTO) 24-26 MG Take 1 tablet by mouth 2 (two) times daily. 180 tablet 3   tadalafil (CIALIS) 5 MG tablet Take 5 mg by mouth at bedtime.      triamterene-hydrochlorothiazide (MAXZIDE-25) 37.5-25 MG tablet Take 1 tablet by mouth once daily 90 tablet 3   No current facility-administered medications for this visit.    Allergies as of 09/27/2023 - Review Complete 09/27/2023  Allergen Reaction Noted   Nsaids  03/11/2020    Family History  Problem Relation Age of Onset   Arthritis Mother        severe   Coronary artery disease Mother    Colon polyps Mother    Heart disease Mother    Sleep apnea Mother    Sleep apnea Father    Coronary artery disease Brother    Cancer Brother        choriocarcinoma   Arrhythmia Brother 29       Afib/Tachycardia   Heart attack Brother    Congestive Heart Failure Brother    Sleep apnea Brother     Social History   Socioeconomic History   Marital status: Married    Spouse name: Not on file   Number of  children: 2   Years of education: MS-Coll.   Highest education level:  Not on file  Occupational History   Occupation: Consultant    Comment: Arts development officer  Tobacco Use   Smoking status: Never   Smokeless tobacco: Never  Vaping Use   Vaping status: Never Used  Substance and Sexual Activity   Alcohol use: Yes    Comment: occasional   Drug use: No   Sexual activity: Yes    Birth control/protection: None  Other Topics Concern   Not on file  Social History Narrative   College- [employer]; [employer]-environment science and mgt. Married [name])  "13" 1 son- 17; 1 dtr "33" 2 grandchildren. 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 End of life-provided discussive context and provided packet.   Social Determinants of Health   Financial Resource Strain: Low Risk  (10/29/2022)   Overall Financial Resource Strain (CARDIA)    Difficulty of Paying Living Expenses: Not hard at all  Food Insecurity: No Food Insecurity (10/29/2022)   Hunger Vital Sign    Worried About Running Out of Food in the Last Year: Never true    Ran Out of Food in the Last Year: Never true  Transportation Needs: No Transportation Needs (10/29/2022)   PRAPARE - Administrator, Civil Service (Medical): No    Lack of Transportation (Non-Medical): No  Physical Activity: Sufficiently Active (10/29/2022)   Exercise Vital Sign    Days of Exercise per Week: 7 days    Minutes of Exercise per Session: 50 min  Stress: No Stress Concern Present (10/29/2022)   [employer] of Occupational Health - Occupational Stress Questionnaire    Feeling of Stress : Not at all  Social Connections: Socially Integrated (10/29/2022)   Social Connection and Isolation Panel [NHANES]    Frequency of Communication with Friends and Family: More than three times a week    Frequency of Social Gatherings with Friends and Family: More than three times a week    Attends Religious Services: More than 4 times per year    Active  Member of [employer] or Organizations: Yes    Attends Engineer, structural: More than 4 times per year    Marital Status: Married  Catering manager Violence: Not At Risk (10/29/2022)   Humiliation, Afraid, Rape, and Kick questionnaire    Fear of Current or Ex-Partner: No    Emotionally Abused: No    Physically Abused: No    Sexually Abused: No    Review of Systems:    Constitutional: No weight loss, fever or chills  Cardiovascular: No chest pain Respiratory: No SOB  Gastrointestinal: See HPI and otherwise negative   Physical Exam:  Vital signs: BP 138/66   Pulse 79   Ht 5\' 9"  (1.753 m)   Wt 190 lb (86.2 kg)   BMI 28.06 kg/m    Constitutional:   Pleasant Elderly Caucasian male appears to be in NAD, Well developed, Well nourished, alert and cooperative Head:  Normocephalic and atraumatic. Eyes:   PEERL, EOMI. No icterus. Conjunctiva pink. Ears:  Normal auditory acuity. Neck:  Supple Throat: Oral cavity and pharynx without inflammation, swelling or lesion.  Respiratory: Respirations even and unlabored. Lungs clear to auscultation bilaterally.   No wheezes, crackles, or rhonchi.  Cardiovascular: Normal S1, S2. No MRG. Regular rate and rhythm. No peripheral edema, cyanosis or pallor.  Gastrointestinal:  Soft, nondistended, nontender. No rebound or guarding. Increased BS all four quad.. No appreciable masses or hepatomegaly. Rectal:  Not performed.  Msk:  Symmetrical without gross deformities. Without edema, no deformity or joint  abnormality.  Neurologic:  Alert and  oriented x4;  grossly normal neurologically.  Skin:   Dry and intact without significant lesions or rashes. Psychiatric:  Demonstrates good judgement and reason without abnormal affect or behaviors.  RELEVANT LABS AND IMAGING: CBC    Component Value Date/Time   WBC 4.0 05/10/2023 1358   WBC 5.1 05/06/2023 0853   WBC 5.2 05/12/2022 1158   RBC 4.18 05/10/2023 1358   RBC 3.80 (L) 05/06/2023 0853   HGB 13.1  05/10/2023 1358   HGB 14.1 07/27/2016 0748   HCT 39.4 05/10/2023 1358   HCT 40.8 07/27/2016 0748   PLT 150 05/10/2023 1358   MCV 94 05/10/2023 1358   MCV 88.8 07/27/2016 0748   MCH 31.3 05/10/2023 1358   MCH 32.1 05/06/2023 0853   MCHC 33.2 05/10/2023 1358   MCHC 33.6 05/06/2023 0853   RDW 12.1 05/10/2023 1358   RDW 14.7 (H) 07/27/2016 0748   LYMPHSABS 0.9 05/06/2023 0853   LYMPHSABS 0.9 05/29/2018 0806   LYMPHSABS 1.0 07/27/2016 0748   MONOABS 0.5 05/06/2023 0853   MONOABS 0.5 07/27/2016 0748   EOSABS 0.2 05/06/2023 0853   EOSABS 0.1 05/29/2018 0806   BASOSABS 0.0 05/06/2023 0853   BASOSABS 0.0 05/29/2018 0806   BASOSABS 0.1 07/27/2016 0748    CMP     Component Value Date/Time   NA 138 05/10/2023 1358   NA 141 07/27/2016 0748   K 4.5 05/10/2023 1358   K 3.9 07/27/2016 0748   CL 102 05/10/2023 1358   CL 104 03/23/2013 0855   CO2 22 05/10/2023 1358   CO2 30 (H) 07/27/2016 0748   GLUCOSE 100 (H) 05/10/2023 1358   GLUCOSE 93 05/06/2023 0853   GLUCOSE 122 07/27/2016 0748   GLUCOSE 98 03/23/2013 0855   BUN 23 05/10/2023 1358   BUN 16.0 07/27/2016 0748   CREATININE 1.10 05/10/2023 1358   CREATININE 1.64 (H) 05/06/2023 0853   CREATININE 1.07 10/30/2021 1557   CREATININE 1.0 07/27/2016 0748   CALCIUM 10.8 (H) 05/10/2023 1358   CALCIUM 10.5 (H) 07/27/2016 0748   PROT 7.3 05/06/2023 0853   PROT 6.6 05/21/2020 0721   PROT 7.5 07/27/2016 0748   ALBUMIN 4.5 05/06/2023 0853   ALBUMIN 4.3 05/21/2020 0721   ALBUMIN 4.3 07/27/2016 0748   AST 19 05/06/2023 0853   AST 24 07/27/2016 0748   ALT 13 05/06/2023 0853   ALT 19 07/27/2016 0748   ALKPHOS 41 05/06/2023 0853   ALKPHOS 53 07/27/2016 0748   BILITOT 0.7 05/06/2023 0853   BILITOT 0.84 07/27/2016 0748   GFRNONAA 42 (L) 05/06/2023 0853   GFRAA >60 04/14/2020 1221    Assessment: 1.  Change in bowel habits: Towards loose explosive stools over the past 2 months, history of colon cancer, no change in medications due for  his next colonoscopy in December of this year for surveillance; concern for infectious cause versus colon cancer versus other 2.  Diarrhea 3.  Moderate aortic stenosis: See HPI 4.  CAD: On Eliquis 5.  Early satiety/bloating and nausea: History of gastritis on Pantoprazole 40 daily, concern for worsening of this  Plan: 1.  Stool studies today to include a GI pathogen panel, fecal calprotectin and lactoferrin 2.  Will try to move up patient's colonoscopy which will be due in December anyways.  He is on the borderline as far as his aortic stenosis to have this procedure done in the [facility] versus at the hospital.  Will send for cardiac and Eliquis clearance,  recommend he hold this for 2 days prior to time of procedure.  Will also communicate with our anesthesia team to see if he should be done in the hospital setting. 3.  Encouraged the patient to use Imodium as needed for diarrhea 4.  Increased Pantoprazole to 40 mg twice daily, 30-60 minutes before breakfast and dinner #60 with 5 refills.  Will see if this helps with his early satiety/nausea in the morning over the next 4 to 6 weeks. 5.  Patient to follow in clinic per recommendations after stool studies above and when we are able to get his colonoscopy scheduled.  Could consider adding an EGD given early satiety recently, certainly if we are going to schedule this in the hospital.  Hyacinth Meeker, PA-C [facility] 09/27/2023, 8:55 AM  Cc: Excell Seltzer, MD

## 2023-09-27 NOTE — Telephone Encounter (Signed)
Pharmacy please advise on holding Eliquis prior to colonoscopy scheduled for TBD. Thank you.   

## 2023-09-27 NOTE — Telephone Encounter (Signed)
[facility] Pre-operative Risk Assessment     Request for surgical clearance:     Endoscopy Procedure  What type of surgery is being performed?     Colonoscopy  When is this surgery scheduled?     TBD  What type of clearance is required ?   Pharmacy and Cardiac  Are there any medications that need to be held prior to surgery and how long? ELIQUIS x 2 days   Practice name and name of physician performing surgery?      [facility]  What is your office phone and fax number?      Phone- [phone]  Fax- [phone]  Anesthesia type (None, local, MAC, general) ?       MAC

## 2023-09-27 NOTE — Telephone Encounter (Signed)
Patient with diagnosis of afib on Eliquis for anticoagulation.    Procedure: colonoscopy Date of procedure: TBD  CHA2DS2-VASc Score = 5  This indicates a 7.2% annual risk of stroke. The patient's score is based upon: CHF History: 1 HTN History: 1 Diabetes History: 0 Stroke History: 0 Vascular Disease History: 1 Age Score: 2 Gender Score: 0   CrCl 66mL/min Platelet count 150K  Per office protocol, patient can hold Eliquis for 2 days prior to procedure as requested.    **This guidance is not considered finalized until pre-operative APP has relayed final recommendations.**

## 2023-09-27 NOTE — Telephone Encounter (Signed)
Name: Zahin Ohanlon  DOB: 1941-08-12  MRN: [id number]  Primary Cardiologist: Tonny Bollman, MD  Chart reviewed as part of pre-operative protocol coverage. The patient has an upcoming visit scheduled with Lake Bells, PA on 09/29/23 at which time clearance can be addressed in case there are any issues that would impact surgical recommendations.   I added preop FYI to appointment note so that provider is aware to address at time of outpatient visit.  Per office protocol the cardiology provider should forward their finalized clearance decision and recommendations regarding antiplatelet therapy to the requesting party below.    Per office protocol, patient can hold Eliquis for 2 days prior to procedure as requested.    I will route this message as FYI to requesting party and remove this message from the preop box as separate preop APP input not needed at this time.   Please call with any questions.  Napoleon Form, Leodis Rains, NP  09/27/2023, 9:51 AM

## 2023-09-27 NOTE — Telephone Encounter (Signed)
Jennifer please read response to cardiac clearance

## 2023-09-27 NOTE — Patient Instructions (Addendum)
We will get information from Dr Rhea Belton about your Colonoscopy and call you back  We will in the meantime try to obtain cardiac clearance and Eliquis clearance in case you are scheduled for your colonoscopy  Your provider has requested that you go to the basement level for lab work before leaving today. Press "B" on the elevator. The lab is located at the first door on the left as you exit the elevator.   Follow per recommendations after procedure and labs   Due to recent changes in healthcare laws, you may see the results of your imaging and laboratory studies on MyChart before your provider has had a chance to review them.  We understand that in some cases there may be results that are confusing or concerning to you. Not all laboratory results come back in the same time frame and the provider may be waiting for multiple results in order to interpret others.  Please give [place] 48 hours in order for your provider to thoroughly review all the results before contacting the office for clarification of your results.    _______________________________________________________  If your blood pressure at your visit was 140/90 or greater, please contact your primary care physician to follow up on this.  _______________________________________________________  If you are age 80 or older, your body mass index should be between 23-30. Your Body mass index is 28.06 kg/m. If this is out of the aforementioned range listed, please consider follow up with your Primary Care Provider.  If you are age 80 or younger, your body mass index should be between 19-25. Your Body mass index is 28.06 kg/m. If this is out of the aformentioned range listed, please consider follow up with your Primary Care Provider.   ________________________________________________________  The [facility] providers would like to encourage you to use [facility] to communicate with providers for non-urgent requests or questions.  Due to long hold  times on the telephone, sending your provider a message by [facility] may be a faster and more efficient way to get a response.  Please allow 48 business hours for a response.  Please remember that this is for non-urgent requests.  _______________________________________________________   I appreciate the  opportunity to care for you  Thank You   Jacelyn Grip

## 2023-09-28 NOTE — Progress Notes (Signed)
Addendum: Reviewed and agree with assessment and management plan. Given symptoms as described I would favor EGD and colonoscopy.  Cathlyn Parsons, CRNA cleared patient for [facility] care assuming cardiology in agreement. Leiani Enright, Carie Caddy, MD

## 2023-09-28 NOTE — Telephone Encounter (Signed)
emmon, Violet Baldy, PA sent to Marlowe Kays, CMA We can wait. Thanks-JLL

## 2023-09-28 NOTE — Telephone Encounter (Signed)
He has an appointment with cardiology tomorrow on Oct 3rd. Cardiology said they will give him clearance after he is seen tomorrow. You still want him scheduled today?

## 2023-09-28 NOTE — Telephone Encounter (Signed)
Unk Lightning, [place] sent to Marlowe Kays, CMA Is this the man we were waiting on cardiac clearance for?  He has been improved for the [facility] from nulty and Dr. Rhea Belton would like him scheduled for an EGD and colonoscopy.  Obviously I think we should still wait for cardiac clearance, but lets go ahead and look at his schedule.  Thanks, JL L

## 2023-09-29 ENCOUNTER — Encounter (HOSPITAL_COMMUNITY): Payer: Self-pay | Admitting: Internal Medicine

## 2023-09-29 ENCOUNTER — Encounter: Payer: Self-pay | Admitting: Cardiology

## 2023-09-29 ENCOUNTER — Ambulatory Visit (HOSPITAL_COMMUNITY)
Admission: RE | Admit: 2023-09-29 | Discharge: 2023-09-29 | Disposition: A | Discharge status: Home / Self Care | Payer: Medicare Other | Source: Ambulatory Visit | Attending: Internal Medicine | Admitting: Internal Medicine

## 2023-09-29 ENCOUNTER — Other Ambulatory Visit: Payer: Medicare Other

## 2023-09-29 VITALS — BP 140/80 | HR 61 | Ht 69.0 in | Wt 188.8 lb

## 2023-09-29 DIAGNOSIS — D6869 Other thrombophilia: Secondary | ICD-10-CM | POA: Insufficient documentation

## 2023-09-29 DIAGNOSIS — G6181 Chronic inflammatory demyelinating polyneuritis: Secondary | ICD-10-CM | POA: Insufficient documentation

## 2023-09-29 DIAGNOSIS — Z951 Presence of aortocoronary bypass graft: Secondary | ICD-10-CM | POA: Insufficient documentation

## 2023-09-29 DIAGNOSIS — R110 Nausea: Secondary | ICD-10-CM

## 2023-09-29 DIAGNOSIS — I480 Paroxysmal atrial fibrillation: Secondary | ICD-10-CM | POA: Diagnosis not present

## 2023-09-29 DIAGNOSIS — Z79899 Other long term (current) drug therapy: Secondary | ICD-10-CM | POA: Diagnosis not present

## 2023-09-29 DIAGNOSIS — I4819 Other persistent atrial fibrillation: Secondary | ICD-10-CM | POA: Insufficient documentation

## 2023-09-29 DIAGNOSIS — I110 Hypertensive heart disease with heart failure: Secondary | ICD-10-CM | POA: Diagnosis not present

## 2023-09-29 DIAGNOSIS — R197 Diarrhea, unspecified: Secondary | ICD-10-CM

## 2023-09-29 DIAGNOSIS — I509 Heart failure, unspecified: Secondary | ICD-10-CM | POA: Insufficient documentation

## 2023-09-29 DIAGNOSIS — Z7901 Long term (current) use of anticoagulants: Secondary | ICD-10-CM | POA: Diagnosis not present

## 2023-09-29 DIAGNOSIS — R194 Change in bowel habit: Secondary | ICD-10-CM

## 2023-09-29 DIAGNOSIS — R1012 Left upper quadrant pain: Secondary | ICD-10-CM

## 2023-09-29 DIAGNOSIS — E785 Hyperlipidemia, unspecified: Secondary | ICD-10-CM | POA: Insufficient documentation

## 2023-09-29 DIAGNOSIS — I472 Ventricular tachycardia, unspecified: Secondary | ICD-10-CM | POA: Insufficient documentation

## 2023-09-29 NOTE — Progress Notes (Signed)
Remote pacemaker transmission.   

## 2023-09-29 NOTE — Progress Notes (Addendum)
Primary Care Physician: Excell Seltzer, MD Primary Cardiologist: Tonny Bollman, MD Electrophysiologist: Regan Lemming, MD     Referring Physician: Dr. Jannet Askew Ladarian Derrick [name] is a 82 y.o. male with a history of CAD s/p inferior MI in 2012 with DES to RCA, s/p CABG 2020, SSS s/p PPM with upgrade to CRT-P in 2023, HTN, HLD, NSVT, pulmonary sarcoid, MGUS, colon cancer, chronic inflammatory demyelinating polyneuropathy, and paroxysmal atrial fibrillation who presents for consultation in the [facility]. Patient called on 7/3 noting arrhythmia since 6/11 and was advised to come off Multaq. Patient is on Eliquis 5 mg BID for a CHADS2VASC score of 6.  On evaluation today, he is currently in AV dual paced rhythm. He did not end up stopping the Multaq. Patient states he felt he went back into normal rhythm while waiting for return call on what to do. He admits that persistent AF likely attributable to mountain trip where he drank more wine, more coffee, and ate out a lot more than usual. He stopped drinking wine as of 7/3 and switched to one cup of decaf. He has not had Afib since then.    On follow up 09/29/23, he is currently in a V paced rhythm. Device interrogation by Dr. Elberta Fortis noted on 9/28 that he is in 100% Afib; advised to stop Multaq. No missed doses of Eliquis. He is minimally symptomatic. He walks daily with no issue. No bleeding issues on Eliquis.   Today, he denies symptoms of palpitations, chest pain, shortness of breath, orthopnea, PND, lower extremity edema, dizziness, presyncope, syncope, snoring, daytime somnolence, bleeding, or neurologic sequela. The patient is tolerating medications without difficulties and is otherwise without complaint today.   he has a BMI of Body mass index is 27.88 kg/m.[name] Filed Weights   09/29/23 0832  Weight: 85.6 kg     Current Outpatient Medications  Medication Sig Dispense Refill   albuterol (VENTOLIN HFA)  108 (90 Base) MCG/ACT inhaler INHALE 2 PUFFS BY MOUTH EVERY 6 HOURS AS NEEDED FOR WHEEZING FOR SHORTNESS OF BREATH 9 g 0   amoxicillin (AMOXIL) 500 MG capsule TAKE 2 TABLETS BY MOUTH 1 HOUR PRIOR TO DENTAL APPOINTMENT AND 2 TABLETS IMMEDIATELY FOLLOWING DENTAL APPOINTMENT     apixaban (ELIQUIS) 5 MG TABS tablet Take 1 tablet (5 mg total) by mouth 2 (two) times daily. 180 tablet 1   bimatoprost (LUMIGAN) 0.01 % SOLN Place 1 drop into both eyes at bedtime.     Cholecalciferol 25 MCG (1000 UT) tablet Take 1,000 Units by mouth daily.      diclofenac Sodium (VOLTAREN) 1 % GEL Apply 2 g topically 4 (four) times daily as needed (pain).     dorzolamide-timolol (COSOPT) 22.3-6.8 MG/ML ophthalmic solution Place 1 drop into both eyes 2 (two) times daily.      dronedarone (MULTAQ) 400 MG tablet Take 400 mg by mouth 2 (two) times daily with a meal.     finasteride (PROSCAR) 5 MG tablet Take 5 mg by mouth daily after breakfast.      fluticasone (FLONASE) 50 MCG/ACT nasal spray Place 2 sprays into both nostrils 2 (two) times daily.     Fluticasone Propionate, Inhal, (FLUTICASONE PROPIONATE DISKUS) 100 MCG/ACT AEPB INHALE 1 PUFF BY MOUTH TWICE DAILY AS NEEDED 60 each 0   Multiple Vitamin (MULTIVITAMIN WITH MINERALS) TABS tablet Take 1 tablet by mouth daily.     pantoprazole (PROTONIX) 40 MG tablet Take 1 tablet (40  mg total) by mouth 2 (two) times daily. 60 tablet 5   Polyethyl Glycol-Propyl Glycol (SYSTANE OP) Place 1 drop into both eyes daily as needed (dry eyes).     rosuvastatin (CRESTOR) 20 MG tablet Take 1 tablet by mouth once daily 90 tablet 2   sacubitril-valsartan (ENTRESTO) 24-26 MG Take 1 tablet by mouth 2 (two) times daily. 180 tablet 3   tadalafil (CIALIS) 5 MG tablet Take 5 mg by mouth at bedtime.      triamterene-hydrochlorothiazide (MAXZIDE-25) 37.5-25 MG tablet Take 1 tablet by mouth once daily 90 tablet 3   No current facility-administered medications for this encounter.    Atrial  Fibrillation Management history:  Previous antiarrhythmic drugs: Multaq Previous cardioversions:  Previous ablations:  Anticoagulation history: Eliquis   ROS- All systems are reviewed and negative except as per the HPI above.  Physical Exam: BP (!) 140/80   Pulse 61   Ht 5\' 9"  (1.753 m)   Wt 85.6 kg   BMI 27.88 kg/m   GEN- The patient is well appearing, alert and oriented x 3 today.   Neck - no JVD or carotid bruit noted Lungs- Clear to ausculation bilaterally, normal work of breathing Heart- Regular rate and rhythm, no murmurs, rubs or gallops, PMI not laterally displaced Extremities- no clubbing, cyanosis, or edema Skin - no rash or ecchymosis noted   EKG today demonstrates  Vent. rate 61 BPM PR interval * ms QRS duration 162 ms QT/QTcB 464/467 ms P-R-T axes * -83 69 Ventricular paced rhythm Confirmed by Verne Carrow [phone]) on 09/29/2023 9:13:46 AM  Echo 12/01/22 demonstrated:  1. The aortic valve is tricuspid. There is severe calcifcation of the  aortic valve. There is severe thickening of the aortic valve. Aortic valve  regurgitation is not visualized. Moderate aortic valve stenosis. Aortic  valve area, by VTI measures 1.04  cm. Aortic valve mean gradient measures 28.0 mmHg. Aortic valve Vmax  measures 3.21 m/s.   2. Left ventricular ejection fraction, by estimation, is 60 to 65%. The  left ventricle has normal function. The left ventricle has no regional  wall motion abnormalities. Left ventricular diastolic parameters are  consistent with Grade II diastolic  dysfunction (pseudonormalization). The average left ventricular global  longitudinal strain is -21.9 %. The global longitudinal strain is normal.   3. Right ventricular systolic function is normal. The right ventricular  size is mildly enlarged. There is normal pulmonary artery systolic  pressure. The estimated right ventricular systolic pressure is 26.0 mmHg.   4. Left atrial size was mild to  moderately dilated.   5. Right atrial size was mildly dilated.   6. The mitral valve is grossly normal. Mild mitral valve regurgitation.  No evidence of mitral stenosis.   7. The inferior vena cava is normal in size with greater than 50%  respiratory variability, suggesting right atrial pressure of 3 mmHg.    ASSESSMENT & PLAN CHA2DS2-VASc Score = 5  The patient's score is based upon: CHF History: 1 HTN History: 1 Diabetes History: 0 Stroke History: 0 Vascular Disease History: 1 Age Score: 2 Gender Score: 0      ASSESSMENT AND PLAN: Persistent Atrial Fibrillation (ICD10:  I48.0) The patient's CHA2DS2-VASc score is 5, indicating a 7.2% annual risk of stroke.    He is in V paced rhythm - device interrogation on 9/28 notes 100% Afib burden. Advised to stop Multaq (he has not yet) per Dr. Gershon Crane recommendation. We discussed treatment such as Tikosyn or ablation. He would  have to transition from Maxzide if chose Tikosyn.  He would like to speak with Dr. Elberta Fortis regarding ablation.   Secondary Hypercoagulable State (ICD10:  D68.69) The patient is at significant risk for stroke/thromboembolism based upon his CHA2DS2-VASc Score of 5.  Continue Apixaban (Eliquis).  No missed doses.    Will arrange f/u visit with Dr. Elberta Fortis. Follow up Afib clinic prn.   Lake Bells, PA-C  Afib Clinic [facility] [address] [place], [place] [address] [phone]

## 2023-09-30 LAB — FECAL LACTOFERRIN, QUANT
Fecal Lactoferrin: POSITIVE — AB
MICRO NUMBER:: 15548255
SPECIMEN QUALITY:: ADEQUATE

## 2023-10-03 ENCOUNTER — Ambulatory Visit: Payer: Medicare Other | Attending: Cardiology | Admitting: Cardiology

## 2023-10-03 ENCOUNTER — Encounter: Payer: Self-pay | Admitting: Cardiology

## 2023-10-03 VITALS — BP 130/50 | HR 72 | Ht 69.0 in | Wt 189.0 lb

## 2023-10-03 DIAGNOSIS — D6869 Other thrombophilia: Secondary | ICD-10-CM

## 2023-10-03 DIAGNOSIS — I2510 Atherosclerotic heart disease of native coronary artery without angina pectoris: Secondary | ICD-10-CM

## 2023-10-03 DIAGNOSIS — I4819 Other persistent atrial fibrillation: Secondary | ICD-10-CM

## 2023-10-03 DIAGNOSIS — I495 Sick sinus syndrome: Secondary | ICD-10-CM

## 2023-10-03 DIAGNOSIS — I10 Essential (primary) hypertension: Secondary | ICD-10-CM | POA: Diagnosis not present

## 2023-10-03 NOTE — Patient Instructions (Signed)
Medication Instructions:  Your physician recommends that you continue on your current medications as directed. Please refer to the Current Medication list given to you today.  *If you need a refill on your cardiac medications before your next appointment, please call your pharmacy*   Lab Work: Pre procedure labs -- see procedure instruction letter:  BMP & CBC  If you have a lab test that is abnormal and we need to change your treatment, we will call you to review the results -- otherwise no news is good news.    Testing/Procedures: Your physician has requested that you have cardiac CT within 1 month PRIOR to your ablation. Cardiac computed tomography (CT) is a painless test that uses an x-ray machine to take clear, detailed pictures of your heart.  Please follow instruction below located under "other instructions". You will get a call from our office to schedule the date for this test.  Your physician has recommended that you have an ablation. Catheter ablation is a medical procedure used to treat some cardiac arrhythmias (irregular heartbeats). During catheter ablation, a long, thin, flexible tube is put into a blood vessel in your groin (upper thigh), or neck. This tube is called an ablation catheter. It is then guided to your heart through the blood vessel. Radio frequency waves destroy small areas of heart tissue where abnormal heartbeats may cause an arrhythmia to start.   You will be scheduled for 02/14/2024, arrive to [facility] @ 5:30 am.  The EP scheduler, April, will be in touch with CT & procedure instructions.   Follow-Up: At [facility], you and your health needs are our priority.  As part of our continuing mission to provide you with exceptional heart care, we have created designated Provider Care Teams.  These Care Teams include your primary Cardiologist (physician) and Advanced Practice Providers (APPs -  Physician Assistants and Nurse Practitioners) who all work  together to provide you with the care you need, when you need it.  We recommend signing up for the patient portal called "MyChart".  Sign up information is provided on this After Visit Summary.  MyChart is used to connect with patients for Virtual Visits (Telemedicine).  Patients are able to view lab/test results, encounter notes, upcoming appointments, etc.  Non-urgent messages can be sent to your provider as well.   To learn more about what you can do with MyChart, go to [email or web address].    Your next appointment:   1 month(s) after your ablation  The format for your next appointment:   In Person  Provider:   AFib clinic   Thank you for choosing [facility]!!   Dory Horn, RN [phone]    Other Instructions   Cardiac Ablation Cardiac ablation is a procedure to destroy (ablate) some heart tissue that is sending bad signals. These bad signals cause problems in heart rhythm. The heart has many areas that make these signals. If there are problems in these areas, they can make the heart beat in a way that is not normal. Destroying some tissues can help make the heart rhythm normal. Tell your doctor about: Any allergies you have. All medicines you are taking. These include vitamins, herbs, eye drops, creams, and over-the-counter medicines. Any problems you or family members have had with medicines that make you fall asleep (anesthetics). Any blood disorders you have. Any surgeries you have had. Any medical conditions you have, such as kidney failure. Whether you are pregnant or may be pregnant. What are the  risks? This is a safe procedure. But problems may occur, including: Infection. Bruising and bleeding. Bleeding into the chest. Stroke or blood clots. Damage to nearby areas of your body. Allergies to medicines or dyes. The need for a pacemaker if the normal system is damaged. Failure of the procedure to treat the problem. What happens before the  procedure? Medicines Ask your doctor about: Changing or stopping your normal medicines. This is important. Taking aspirin and ibuprofen. Do not take these medicines unless your doctor tells you to take them. Taking other medicines, vitamins, herbs, and supplements. General instructions Follow instructions from your doctor about what you cannot eat or drink. Plan to have someone take you home from the hospital or clinic. If you will be going home right after the procedure, plan to have someone with you for 24 hours. Ask your doctor what steps will be taken to prevent infection. What happens during the procedure?  An IV tube will be put into one of your veins. You will be given a medicine to help you relax. The skin on your neck or groin will be numbed. A cut (incision) will be made in your neck or groin. A needle will be put through your cut and into a large vein. A tube (catheter) will be put into the needle. The tube will be moved to your heart. Dye may be put through the tube. This helps your doctor see your heart. Small devices (electrodes) on the tube will send out signals. A type of energy will be used to destroy some heart tissue. The tube will be taken out. Pressure will be held on your cut. This helps stop bleeding. A bandage will be put over your cut. The exact procedure may vary among doctors and hospitals. What happens after the procedure? You will be watched until you leave the hospital or clinic. This includes checking your heart rate, breathing rate, oxygen, and blood pressure. Your cut will be watched for bleeding. You will need to lie still for a few hours. Do not drive for 24 hours or as long as your doctor tells you. Summary Cardiac ablation is a procedure to destroy some heart tissue. This is done to treat heart rhythm problems. Tell your doctor about any medical conditions you may have. Tell him or her about all medicines you are taking to treat them. This is a  safe procedure. But problems may occur. These include infection, bruising, bleeding, and damage to nearby areas of your body. Follow what your doctor tells you about food and drink. You may also be told to change or stop some of your medicines. After the procedure, do not drive for 24 hours or as long as your doctor tells you. This information is not intended to replace advice given to you by your health care provider. Make sure you discuss any questions you have with your health care provider. Document Revised: 03/05/2022 Document Reviewed: 11/15/2019 Elsevier Patient Education  2023 Elsevier Inc.   Cardiac Ablation, Care After  This sheet gives you information about how to care for yourself after your procedure. Your health care provider may also give you more specific instructions. If you have problems or questions, contact your health care provider. What can I expect after the procedure? After the procedure, it is common to have: Bruising around your puncture site. Tenderness around your puncture site. Skipped heartbeats. If you had an atrial fibrillation ablation, you may have atrial fibrillation during the first several months after your procedure.  Tiredness (  fatigue).  Follow these instructions at home: Puncture site care  Follow instructions from your health care provider about how to take care of your puncture site. Make sure you: If present, leave stitches (sutures), skin glue, or adhesive strips in place. These skin closures may need to stay in place for up to 2 weeks. If adhesive strip edges start to loosen and curl up, you may trim the loose edges. Do not remove adhesive strips completely unless your health care provider tells you to do that. If a large square bandage is present, this may be removed 24 hours after surgery.  Check your puncture site every day for signs of infection. Check for: Redness, swelling, or pain. Fluid or blood. If your puncture site starts to bleed,  lie down on your back, apply firm pressure to the area, and contact your health care provider. Warmth. Pus or a bad smell. A pea or small marble sized lump at the site is normal and can take up to three months to resolve.  Driving Do not drive for at least 4 days after your procedure or however long your health care provider recommends. (Do not resume driving if you have previously been instructed not to drive for other health reasons.) Do not drive or use heavy machinery while taking prescription pain medicine. Activity Avoid activities that take a lot of effort for at least 7 days after your procedure. Do not lift anything that is heavier than 5 lb (4.5 kg) for one week.  No sexual activity for 1 week.  Return to your normal activities as told by your health care provider. Ask your health care provider what activities are safe for you. General instructions Take over-the-counter and prescription medicines only as told by your health care provider. Do not use any products that contain nicotine or tobacco, such as cigarettes and e-cigarettes. If you need help quitting, ask your health care provider. You may shower after 24 hours, but Do not take baths, swim, or use a hot tub for 1 week.  Do not drink alcohol for 24 hours after your procedure. Keep all follow-up visits as told by your health care provider. This is important. Contact a health care provider if: You have redness, mild swelling, or pain around your puncture site. You have fluid or blood coming from your puncture site that stops after applying firm pressure to the area. Your puncture site feels warm to the touch. You have pus or a bad smell coming from your puncture site. You have a fever. You have chest pain or discomfort that spreads to your neck, jaw, or arm. You have chest pain that is worse with lying on your back or taking a deep breath. You are sweating a lot. You feel nauseous. You have a fast or irregular  heartbeat. You have shortness of breath. You are dizzy or light-headed and feel the need to lie down. You have pain or numbness in the arm or leg closest to your puncture site. Get help right away if: Your puncture site suddenly swells. Your puncture site is bleeding and the bleeding does not stop after applying firm pressure to the area. These symptoms may represent a serious problem that is an emergency. Do not wait to see if the symptoms will go away. Get medical help right away. Call your local emergency services (911 in the [place] Do not drive yourself to the hospital. Summary After the procedure, it is normal to have bruising and tenderness at the puncture site in  your groin, neck, or forearm. Check your puncture site every day for signs of infection. Get help right away if your puncture site is bleeding and the bleeding does not stop after applying firm pressure to the area. This is a medical emergency. This information is not intended to replace advice given to you by your health care provider. Make sure you discuss any questions you have with your health care provider.

## 2023-10-03 NOTE — Progress Notes (Signed)
Electrophysiology Office Note:   Date:  10/03/2023  ID:  Derrick, [name] September 11, 1941, MRN [id number]  Primary Cardiologist: Tonny Bollman, MD Electrophysiologist: Regan Lemming, MD      History of Present Illness:   Derrick [name] is a 82 y.o. male with h/o coronary artery disease post CABG, PE, pulmonary sarcoidosis, MDS, sick sinus syndrome, atrial fibrillation seen today for routine electrophysiology followup.   Since last being seen in our clinic the patient reports more shortness of breath with fatigue.  He can do his daily activities, but has noted that he has to do them more slowly over the last few months.  He also gets some dizziness.  He is in atrial fibrillation today, and feels that this is likely the contributing factor.  he denies chest pain, palpitations, dyspnea, PND, orthopnea, nausea, vomiting, dizziness, syncope, edema, weight gain, or early satiety.   Review of systems complete and found to be negative unless listed in HPI.      EP Information / Studies Reviewed:    EKG is not ordered today. EKG from 09/29/23 reviewed which showed AF, V paced      PPM Interrogation-  reviewed in detail today,  See PACEART report.  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 The patient's score is based upon: CHF History: 1 HTN History: 1 Diabetes History: 0 Stroke History: 0 Vascular Disease History: 1 Age Score: 2 Gender Score: 0             Physical Exam:   VS:  BP (!) 130/50 (BP Location: Left Arm, Patient Position: Sitting, Cuff Size: Normal)   Pulse 72   Ht 5\' 9"  (1.753 m)   Wt 189 lb (85.7 kg)   BMI 27.91 kg/m    Wt Readings from Last 3 Encounters:  10/03/23 189 lb (85.7 kg)  09/29/23 188 lb 12.8 oz (85.6 kg)  09/27/23 190 lb (86.2 kg)     GEN: Well nourished, well developed in no acute  distress NECK: No JVD; No carotid bruits CARDIAC: Regular rate and rhythm, no murmurs, rubs, gallops RESPIRATORY:  Clear to auscultation without rales, wheezing or rhonchi  ABDOMEN: Soft, non-tender, non-distended EXTREMITIES:  No edema; No deformity   ASSESSMENT AND PLAN:    SND s/p Medtronic PPM  Normal PPM function See Pace Art report No changes today  2.  Persistent atrial fibrillation: He is in atrial fibrillation today and has been for many months.  He feels weak, fatigued, short of breath.  He would prefer a rhythm control strategy.  He has pulmonary sarcoidosis and would prefer to avoid amiodarone.  He would prefer to avoid admission to the hospital for dofetilide.  [name] plan for ablation.  Risk and benefits have been discussed.  He understands the risks and is agreed to the procedure.  Risk, benefits, and alternatives to EP study and radiofrequency/pulse field ablation for afib were also discussed in detail today. These risks include but are not limited to stroke, bleeding, vascular damage, tamponade, perforation, damage to the esophagus, lungs, and other structures, pulmonary vein stenosis, worsening renal function, and death. The patient understands these risk and wishes to proceed.  We [name] therefore proceed with catheter ablation at the next available time.  Carto, ICE, anesthesia are requested for the procedure.  [name] also obtain CT PV protocol prior to the procedure to exclude  LAA thrombus and further evaluate atrial anatomy.  3.  Coronary artery disease: Post CABG.  No current chest pain.  Plan per primary cardiology.  4.  Moderate aortic stenosis: Found on recent echo.  No changes.  5.  Secondary hypercoagulable state: Currently on Eliquis for atrial fibrillation  6.  Hypertension: Currently well-controlled  Disposition:   Follow up with Dr. Elberta Fortis as usual post procedure  Signed, [name], MD

## 2023-10-05 LAB — GI PROFILE, STOOL, PCR

## 2023-10-05 LAB — CALPROTECTIN, FECAL: Calprotectin, Fecal: 134 ug/g — ABNORMAL HIGH (ref 0–120)

## 2023-10-05 NOTE — Telephone Encounter (Signed)
Derrick [name], We had seen Derrick [name] recently in clinic to discuss change in bowel habits.  We had recommended and plan for upper and lower endoscopy. After seeing your note it appears he Derrick [name] be having an atrial fibrillation ablation in February. Do recommend that we wait until after ablation to pursue our endoscopic evaluation or do you feel appropriate to proceed at this time? Thanks for your help and opinion. Vonna Kotyk [name]

## 2023-10-05 NOTE — Telephone Encounter (Signed)
Pt aware 11/5 appt has been cancelled since we saw him 2 days ago. Patient verbalized understanding and agreeable to plan.

## 2023-10-06 NOTE — Telephone Encounter (Signed)
I do not see a contraindication to [facility] procedures Has mod aortic stenosis, but not severe as of last ECHO Will ask Cathlyn Parsons for anesthesia opinion here Carie Caddy. Leith Hedlund, M.D.  10/06/2023

## 2023-10-07 NOTE — Telephone Encounter (Signed)
Awaiting response from anesthesia.

## 2023-10-10 ENCOUNTER — Telehealth: Payer: Self-pay

## 2023-10-10 ENCOUNTER — Telehealth: Payer: Self-pay | Admitting: *Deleted

## 2023-10-10 NOTE — Telephone Encounter (Signed)
This encounter has been addressed via Ms. Rinaldo Cloud

## 2023-10-10 NOTE — Telephone Encounter (Signed)
Pt scheduled for telephone previsit on 11/14/23 at 1pm. ECL schedueld in the [facility] 12/01/23 at 3:30pm. Pt aware of appts.

## 2023-10-10 NOTE — Telephone Encounter (Signed)
-----   Message from Unk Lightning sent at 10/07/2023  9:52 AM EDT ----- Regarding: RE: where to schedule for the EGD/ Colonoscopy [name], looks like we are waiting on a response from you just to make sure this procedure can be done at the [facility].  Per Dr. Rhea Belton does not look like there are any contraindications, but just wanted make sure.  Thanks, JL L ----- Message ----- From: Avanell Shackleton, RN Sent: 10/07/2023   8:55 AM EDT To: Unk Lightning, PA Subject: where to schedule for the EGD/ Colonoscopy     Good morning,  Just wanted to clarify where the patient will be having his procedure done; the [facility] or the hospital?  Thank you

## 2023-10-10 NOTE — Telephone Encounter (Signed)
-----   Message from NULTY,JOHN sent at 10/08/2023 10:04 AM EDT ----- Regarding: RE: where to schedule for the EGD/ Colonoscopy Victorino Dike,  This pt is cleared for anesthetic care at [facility].  Best regards,  Cathlyn Parsons ----- Message ----- From: Unk Lightning, PA Sent: 10/07/2023   9:52 AM EDT To: Cathlyn Parsons, CRNA; Chrystie Nose, RN; # Subject: RE: where to schedule for the EGD/ Colon[place]#  Mr. Oretha Milch, looks like we are waiting on a response from you just to make sure this procedure can be done at the [facility].  Per Dr. Rhea Belton does not look like there are any contraindications, but just wanted make sure.  Thanks, JL L ----- Message ----- From: Avanell Shackleton, RN Sent: 10/07/2023   8:55 AM EDT To: Unk Lightning, PA Subject: where to schedule for the EGD/ Colonoscopy     Good morning,  Just wanted to clarify where the patient will be having his procedure done; the [facility] or the hospital?  Thank you

## 2023-10-11 NOTE — Telephone Encounter (Signed)
Patient already scheduled on 12/5 for EGD/Colon and has a Pre-Op appointment

## 2023-10-20 ENCOUNTER — Telehealth: Payer: Self-pay | Admitting: *Deleted

## 2023-10-20 DIAGNOSIS — E559 Vitamin D deficiency, unspecified: Secondary | ICD-10-CM

## 2023-10-20 DIAGNOSIS — D508 Other iron deficiency anemias: Secondary | ICD-10-CM

## 2023-10-20 DIAGNOSIS — E785 Hyperlipidemia, unspecified: Secondary | ICD-10-CM

## 2023-10-20 NOTE — Telephone Encounter (Signed)
-----   Message from Alvina Chou sent at 10/20/2023 11:53 AM EDT ----- Regarding: Lab orders for Fri, 11.8.24 Patient is scheduled for CPX labs, please order future labs, Thanks , Camelia Eng

## 2023-10-25 DIAGNOSIS — H0100A Unspecified blepharitis right eye, upper and lower eyelids: Secondary | ICD-10-CM | POA: Diagnosis not present

## 2023-10-25 DIAGNOSIS — H0100B Unspecified blepharitis left eye, upper and lower eyelids: Secondary | ICD-10-CM | POA: Diagnosis not present

## 2023-10-25 DIAGNOSIS — H401132 Primary open-angle glaucoma, bilateral, moderate stage: Secondary | ICD-10-CM | POA: Diagnosis not present

## 2023-10-25 DIAGNOSIS — H31012 Macula scars of posterior pole (postinflammatory) (post-traumatic), left eye: Secondary | ICD-10-CM | POA: Diagnosis not present

## 2023-10-25 DIAGNOSIS — H35371 Puckering of macula, right eye: Secondary | ICD-10-CM | POA: Diagnosis not present

## 2023-10-25 DIAGNOSIS — H43812 Vitreous degeneration, left eye: Secondary | ICD-10-CM | POA: Diagnosis not present

## 2023-10-27 ENCOUNTER — Ambulatory Visit (INDEPENDENT_AMBULATORY_CARE_PROVIDER_SITE_OTHER): Payer: Medicare Other

## 2023-10-27 ENCOUNTER — Encounter: Payer: Self-pay | Admitting: Cardiology

## 2023-10-27 ENCOUNTER — Encounter: Payer: Self-pay | Admitting: Family Medicine

## 2023-10-27 VITALS — Ht 69.0 in | Wt 189.0 lb

## 2023-10-27 DIAGNOSIS — Z0000 Encounter for general adult medical examination without abnormal findings: Secondary | ICD-10-CM

## 2023-10-27 DIAGNOSIS — J301 Allergic rhinitis due to pollen: Secondary | ICD-10-CM

## 2023-10-27 DIAGNOSIS — H9193 Unspecified hearing loss, bilateral: Secondary | ICD-10-CM

## 2023-10-27 MED ORDER — FLUTICASONE PROPIONATE DISKUS 100 MCG/ACT IN AEPB
1.0000 | INHALATION_SPRAY | Freq: Two times a day (BID) | RESPIRATORY_TRACT | 0 refills | Status: DC | PRN
Start: 2023-10-27 — End: 2023-11-11

## 2023-10-27 NOTE — Patient Instructions (Addendum)
Derrick [name] , Thank you for taking time to come for your Medicare Wellness Visit. I appreciate your ongoing commitment to your health goals. Please review the following plan we discussed and let me know if I can assist you in the future.   Referrals/Orders/Follow-Ups/Clinician Recommendations:   Patient complains of difficulty with hearing. ENT referral placed. Patient is in agreement with treatment plan. Aware that the office will call with an appointment.      This is a list of the screening recommended for you and due dates:  Health Maintenance  Topic Date Due   COVID-19 Vaccine (8 - 2023-24 season) 10/27/2023   Colon Cancer Screening  11/29/2023   Medicare Annual Wellness Visit  10/26/2024   DTaP/Tdap/Td vaccine (2 - Td or Tdap) 01/12/2026   Pneumonia Vaccine  Completed   Flu Shot  Completed   Zoster (Shingles) Vaccine  Completed   HPV Vaccine  Aged Out   Hepatitis C Screening  Discontinued    Advanced directives: (In Chart) A copy of your advanced directives are scanned into your chart should your provider ever need it.  Next Medicare Annual Wellness Visit scheduled for next year: Yes 10/31/24 @ 11:30am telephone

## 2023-10-27 NOTE — Progress Notes (Signed)
Subjective:   Derrick [name] is a 82 y.o. male who presents for Medicare Annual/Subsequent preventive examination.  Visit Complete: Virtual I connected with  Derrick [name] on 10/27/23 by a audio enabled telemedicine application and verified that I am speaking with the correct person using two identifiers.  Patient Location: Home  Provider Location: Office/Clinic  I discussed the limitations of evaluation and management by telemedicine. The patient expressed understanding and agreed to proceed.  Vital Signs: Because this visit was a virtual/telehealth visit, some criteria may be missing or patient reported. Any vitals not documented were not able to be obtained and vitals that have been documented are patient reported.  Patient Medicare AWV questionnaire was completed by the patient on 10/24/23; I have confirmed that all information answered by patient is correct and no changes since this date. Cardiac Risk Factors include: advanced age (>33men, >42 women);male gender;dyslipidemia;hypertension;sedentary lifestyle    Objective:    Today's Vitals   10/27/23 1123  Weight: 189 lb (85.7 kg)  Height: 5\' 9"  (1.753 m)   Body mass index is 27.91 kg/m.     10/27/2023   11:36 AM 10/29/2022   12:45 PM 10/26/2022   11:24 AM 09/23/2022    9:26 AM 05/12/2022   11:32 AM 10/22/2021    9:08 AM 10/21/2020    9:28 AM  Advanced Directives  Does Patient Have a Medical Advance Directive? Yes Yes Yes Yes No Yes Yes  Type of Estate agent of [place];Living will Healthcare Power of [place];Living will Healthcare Power of [place];Living will Healthcare Power of [place];Living will  Healthcare Power of [place];Living will Healthcare Power of [place];Living will  Does patient want to make changes to medical advance directive?  No - Patient declined No - Patient declined   Yes (MAU/Ambulatory/Procedural Areas - Information given)   Copy of Healthcare Power of Attorney in  Chart? No - copy requested Yes - validated most recent copy scanned in chart (See row information) Yes - validated most recent copy scanned in chart (See row information)   Yes - validated most recent copy scanned in chart (See row information) Yes - validated most recent copy scanned in chart (See row information)    Current Medications (verified) Outpatient Encounter Medications as of 10/27/2023  Medication Sig   albuterol (VENTOLIN HFA) 108 (90 Base) MCG/ACT inhaler INHALE 2 PUFFS BY MOUTH EVERY 6 HOURS AS NEEDED FOR WHEEZING FOR SHORTNESS OF BREATH   amoxicillin (AMOXIL) 500 MG capsule TAKE 2 TABLETS BY MOUTH 1 HOUR PRIOR TO DENTAL APPOINTMENT AND 2 TABLETS IMMEDIATELY FOLLOWING DENTAL APPOINTMENT   apixaban (ELIQUIS) 5 MG TABS tablet Take 1 tablet (5 mg total) by mouth 2 (two) times daily.   bimatoprost (LUMIGAN) 0.01 % SOLN Place 1 drop into both eyes at bedtime.   Cholecalciferol 25 MCG (1000 UT) tablet Take 1,000 Units by mouth daily.    diclofenac Sodium (VOLTAREN) 1 % GEL Apply 2 g topically 4 (four) times daily as needed (pain).   dorzolamide-timolol (COSOPT) 22.3-6.8 MG/ML ophthalmic solution Place 1 drop into both eyes 2 (two) times daily.    finasteride (PROSCAR) 5 MG tablet Take 5 mg by mouth daily after breakfast.    fluticasone (FLONASE) 50 MCG/ACT nasal spray Place 2 sprays into both nostrils 2 (two) times daily.   Fluticasone Propionate, Inhal, (FLUTICASONE PROPIONATE DISKUS) 100 MCG/ACT AEPB INHALE 1 PUFF BY MOUTH TWICE DAILY AS NEEDED   Multiple Vitamin (MULTIVITAMIN WITH MINERALS) TABS tablet Take 1 tablet by mouth daily.  pantoprazole (PROTONIX) 40 MG tablet Take 1 tablet (40 mg total) by mouth 2 (two) times daily.   Polyethyl Glycol-Propyl Glycol (SYSTANE OP) Place 1 drop into both eyes daily as needed (dry eyes).   rosuvastatin (CRESTOR) 20 MG tablet Take 1 tablet by mouth once daily   sacubitril-valsartan (ENTRESTO) 24-26 MG Take 1 tablet by mouth 2 (two) times daily.    tadalafil (CIALIS) 5 MG tablet Take 5 mg by mouth at bedtime.    triamterene-hydrochlorothiazide (MAXZIDE-25) 37.5-25 MG tablet Take 1 tablet by mouth once daily   No facility-administered encounter medications on file as of 10/27/2023.    Allergies (verified) Nsaids   History: Past Medical History:  Diagnosis Date   Anemia    iron deficiency anemia- infusion on 02/15/2020 and 02/22/2020   BENIGN PROSTATIC HYPERTROPHY, WITH OBSTRUCTION 05/28/2010   CAD (coronary artery disease)    a. s/p NSTEMI 06/01/11: DES to RCA;  b. cath 06/25/11:   dLM 50-60% (FFR 0.87), prox to mid LAD 40-50%, D1 50%, pCFX 50%, RCA stent ok, dPDA 80%, EF 55-60%.  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   DISH (diffuse idiopathic skeletal hyperostosis) 12/02/2015   Diverticulitis    GENERALIZED OSTEOARTHROSIS UNSPECIFIED SITE 05/28/2010   GERD 05/28/2010   Heart murmur    History of gout    History of kidney stones    HYPERLIPIDEMIA 05/28/2010   Hypertension    IgM lambda paraproteinemia    Intrinsic asthma, 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   "associated w/lung sarcoidosis"   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   MITRAL VALVE PROLAPSE 05/28/2010   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   NSTEMI (non-ST elevated myocardial infarction) ([name]) 06/03/11   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   OSA on CPAP 05/28/2010   PALPITATIONS, CHRONIC 05/28/2010   Parotid gland pain 2010   infection   Peripheral neuropathy     "tx'd w/targeted chemo" (05/12/2016)   Pneumonia 2-3 times   Presence of permanent cardiac pacemaker    Medtronic   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   Sarcoidosis    pulmonalis   UNSPECIFIED INFLAMMATORY AND TOXIC NEUROPATHY 05/28/2010   Vision loss    VITAMIN D DEFICIENCY 05/28/2010   Past Surgical History:  Procedure Laterality Date   APPENDECTOMY  1954   age 69   BIV UPGRADE N/A 09/23/2022   Procedure: BIV PACEMAKER UPGRADE;  Surgeon: Regan Lemming, MD;  Location: [facility] INVASIVE CV LAB;  Service: Cardiovascular;  Laterality: N/A;   CATARACT EXTRACTION W/ INTRAOCULAR LENS IMPLANT Left    COLON SURGERY  2000   decending colon    CORONARY ANGIOPLASTY WITH STENT PLACEMENT  06/03/11   CORONARY ARTERY BYPASS GRAFT N/A 06/05/2019   Procedure: CORONARY ARTERY BYPASS GRAFTING (CABG) TIMES THREE USING LEFT INTERNAL MAMMARY ARTERY AND RIGHT GREATER SEPHANOUS VEIN;  Surgeon: Kerin Perna, MD;  Location: [facility] OR;  Service: Open Heart Surgery;  Laterality: N/A;   CORONARY PRESSURE/FFR STUDY N/A 05/15/2019   Procedure: INTRAVASCULAR PRESSURE  WIRE/FFR STUDY;  Surgeon: Tonny Bollman, MD;  Location: [facility] INVASIVE CV LAB;  Service: Cardiovascular;  Laterality: N/A;   ELECTROPHYSIOLOGIC STUDY N/A 05/12/2016   Procedure: Cardioversion;  Surgeon: Will Jorja Loa, MD;  Location: [facility] INVASIVE CV LAB;  Service: Cardiovascular;  Laterality: N/A;   EP IMPLANTABLE DEVICE N/A 05/12/2016   Procedure: Pacemaker Implant;  Surgeon: Will Jorja Loa, MD;  Location: [facility] INVASIVE CV LAB;  Service: Cardiovascular;  Laterality: N/A;   EYE SURGERY     bilateral cataract with lens implant   HERNIA REPAIR  01/2019   left inguinal hernia repair   INGUINAL HERNIA REPAIR Left 02/21/2019   Procedure: LEFT INGUINAL HERNIA REPAIR WITH MESH;  Surgeon: Harriette Bouillon, MD;  Location: [facility] OR;  Service: General;  Laterality: Left;   INSERT / REPLACE / REMOVE PACEMAKER  05/12/2016   KNEE ARTHROSCOPY Right 2003   LEFT HEART CATH AND  CORONARY ANGIOGRAPHY N/A 05/15/2019   Procedure: LEFT HEART CATH AND CORONARY ANGIOGRAPHY;  Surgeon: Tonny Bollman, MD;  Location[facility] INVASIVE CV LAB;  Service: Cardiovascular;  Laterality: N/A;   LEFT HEART CATHETERIZATION WITH CORONARY ANGIOGRAM N/A 03/31/2015   Procedure: LEFT HEART CATHETERIZATION WITH CORONARY ANGIOGRAM;  Surgeon: Tonny Bollman, MD;  Location[facility] CATH LAB;  Service: Cardiovascular;  Laterality: N/A;   LUNG SURGERY  2001   "open lung dissection"   MOHS SURGERY Left ~ 2008   ear   PROSTATE BIOPSY  2010   TEE WITHOUT CARDIOVERSION N/A 06/05/2019   Procedure: TRANSESOPHAGEAL ECHOCARDIOGRAM (TEE);  Surgeon: Donata Clay, Theron Arista, MD;  Location: [facility] OR;  Service: Open Heart Surgery;  Laterality: N/A;   TONSILLECTOMY  1950   TOTAL KNEE ARTHROPLASTY Right 03/11/2020   Procedure: TOTAL KNEE ARTHROPLASTY;  Surgeon: Durene Romans, MD;  Location: [facility] ORS;  Service: Orthopedics;  Laterality: Right;  70 mins   Family History  Problem Relation Age of Onset   Arthritis Mother        severe   Coronary artery disease Mother    Colon polyps Mother    Heart disease Mother    Sleep apnea Mother    Sleep apnea Father    Coronary artery disease Brother    Cancer Brother        choriocarcinoma   Arrhythmia Brother 38       Afib/Tachycardia   Heart attack Brother    Congestive Heart Failure Brother    Sleep apnea Brother    Social History   Socioeconomic History   Marital status: Married    Spouse name: Not on file   Number of children: 2   Years of education: MS-Coll.   Highest education level: Not on file  Occupational History   Occupation: Consultant    Comment: Arts development officer  Tobacco Use   Smoking status: Never   Smokeless tobacco: Never   Tobacco comments:    Never smoked 09/29/23  Vaping Use   Vaping status: Never Used  Substance and Sexual Activity   Alcohol use: Yes    Comment: occasional   Drug use: No   Sexual activity: Yes    Birth control/protection:  None  Other Topics Concern   Not on file  Social History Narrative   College- [employer]; [employer]-environment science and mgt. Married [name])  "66" 1 son- 80; 1 dtr "12" 2 grandchildren. 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 End of life-provided discussive context and provided packet.   Social Determinants of Health   Financial Resource Strain: Low Risk  (10/24/2023)  Overall Financial Resource Strain (CARDIA)    Difficulty of Paying Living Expenses: Not very hard  Food Insecurity: No Food Insecurity (10/24/2023)   Hunger Vital Sign    Worried About Running Out of Food in the Last Year: Never true    Ran Out of Food in the Last Year: Never true  Transportation Needs: No Transportation Needs (10/24/2023)   PRAPARE - Administrator, Civil Service (Medical): No    Lack of Transportation (Non-Medical): No  Physical Activity: Sufficiently Active (10/24/2023)   Exercise Vital Sign    Days of Exercise per Week: 7 days    Minutes of Exercise per Session: 30 min  Stress: No Stress Concern Present (10/24/2023)   Derrick [name] of Occupational Health - Occupational Stress Questionnaire    Feeling of Stress : Not at all  Social Connections: Socially Integrated (10/24/2023)   Social Connection and Isolation Panel [NHANES]    Frequency of Communication with Friends and Family: More than three times a week    Frequency of Social Gatherings with Friends and Family: More than three times a week    Attends Religious Services: More than 4 times per year    Active Member of [employer] or Organizations: Yes    Attends Banker Meetings: 1 to 4 times per year    Marital Status: Married    Tobacco Counseling Counseling given: Not Answered Tobacco comments: Never smoked 09/29/23   Clinical Intake:  Pre-visit preparation completed: No  Pain : No/denies pain     BMI - recorded: 27.91 Nutritional Status: BMI 25 -29 Overweight Nutritional Risks: Nausea/  vomitting/ diarrhea (nausea in mornins:takes Pantoprazole bid now)  How often do you need to have someone help you when you read instructions, pamphlets, or other written materials from your doctor or pharmacy?: 1 - Never  Interpreter Needed?: No  Comments: lives with spouse and son Information entered by :: B.Donal Lynam,LPN   Activities of Daily Living    10/24/2023   11:02 AM 10/29/2022   12:42 PM  In your present state of health, do you have any difficulty performing the following activities:  Hearing? 1 0  Vision? 0 0  Difficulty concentrating or making decisions? 0 0  Walking or climbing stairs? 1 0  Dressing or bathing? 0 0  Doing errands, shopping? 0 0  Preparing Food and eating ? N N  Using the Toilet? N N  In the past six months, have you accidently leaked urine? Y N  Do you have problems with loss of bowel control? N N  Managing your Medications? N N  Managing your Finances? N N  Housekeeping or managing your Housekeeping? N N    Patient Care Team: Excell Seltzer, MD as PCP - General (Family Medicine) Regan Lemming, MD as PCP - Electrophysiology (Cardiology) Tonny Bollman, MD as PCP - Cardiology (Cardiology) Artis Delay, MD as Consulting Physician (Hematology and Oncology) Vilinda Flake, [facility] (Inactive) as Pharmacist (Pharmacist) Rhea Belton, Carie Caddy, MD as Consulting Physician (Gastroenterology)  Indicate any recent Medical Services you may have received from other than [facility] providers in the past year (date may be approximate).     Assessment:   This is a routine wellness examination for Derrick [name].  Hearing/Vision screen Hearing Screening - Comments:: Pt says he some hearing loss:needs referrals Referral for audiology Vision Screening - Comments:: Pt says his vision is good with glasses 20/20 rt eye; left eye partial vision Dr Burgess [facility]    Goals Addressed  This Visit's Progress     COMPLETED: DIET - EAT MORE  FRUITS AND VEGETABLES   On track     DIET - INCREASE WATER INTAKE   Not on track     Starting 06/13/2018, I will continue to drink at least 2 liters of water daily.       Patient Stated   Not on track     09/26/2019, Patient wants to focus on improving his energy level after just having heart surgery      Patient Stated   Not on track     10/21/2020, I will continue to walk everyday for 2 [name].       COMPLETED: Patient stated (pt-stated)   On track     Help my daughter through Chemotherapy.       Depression Screen    10/27/2023   11:33 AM 10/29/2022   12:41 PM 10/26/2022   11:23 AM 10/22/2021    9:13 AM 10/21/2020    9:28 AM 06/27/2020    8:54 AM 09/26/2019   11:32 AM  PHQ 2/9 Scores  PHQ - 2 Score 0 0 0 0 0 0 0  PHQ- 9 Score  0 0  0  0    Fall Risk    10/24/2023   11:02 AM 10/29/2022   12:42 PM 10/26/2022   11:25 AM 10/19/2022   10:49 AM 10/22/2021    9:11 AM  Fall Risk   Falls in the past year? 1 1 1 1 1   Comment     tripped  Number falls in past yr: 1 0 1 1 0  Comment     trimming shrubs  Injury with Fall? 0 0 0 0 0  Comment     soreness  Risk for fall due to : Impaired balance/gait;Impaired vision No Fall Risks History of fall(s)  Other (Comment)  Risk for fall due to: Comment     tripped  Follow up Falls prevention discussed;Education provided Falls prevention discussed Falls prevention discussed;Falls evaluation completed  Falls prevention discussed    MEDICARE RISK AT HOME: Medicare Risk at Home Any stairs in or around the home?: Yes If so, are there any without handrails?: No Home free of loose throw rugs in walkways, pet beds, electrical cords, etc?: Yes Adequate lighting in your home to reduce risk of falls?: Yes Life alert?: No Use of a cane, walker or w/c?: Yes Grab bars in the bathroom?: Yes Shower chair or bench in shower?: Yes Elevated toilet seat or a handicapped toilet?: Yes  TIMED UP AND GO:  Was the test performed?  No    Cognitive  Function:    10/21/2020    9:29 AM 09/26/2019   11:35 AM 06/13/2018   11:50 AM  MMSE - Mini Mental State Exam  Orientation to time 5 5 5   Orientation to Place 5 5 5   Registration 3 3 3   Attention/ Calculation 5 5 0  Recall 3 2 1   Recall-comments   unable to recall 2 of 3 words  Language- name 2 objects   0  Language- repeat 1 1 1   Language- follow 3 step command   3  Language- read & follow direction   0  Write a sentence   0  Copy design   0  Total score   18      11/05/2019   11:04 AM 07/26/2018    2:42 PM 01/26/2018    1:40 PM  Montreal Cognitive Assessment   Visuospatial/ Executive (  0/5) 4 2 4   Naming (0/3) 3 3 3   Attention: Read list of digits (0/2) 2 2 1   Attention: Read list of letters (0/1) 1 1 1   Attention: Serial 7 subtraction starting at 100 (0/3) 3 3 3   Language: Repeat phrase (0/2) 2 2 1   Language : Fluency (0/1) 1 1 0  Abstraction (0/2) 2 2 2   Delayed Recall (0/5) 5 4 3   Orientation (0/6) 6 5 6   Total 29 25 24       10/27/2023   11:37 AM 10/29/2022   12:45 PM 10/26/2022   11:26 AM  6CIT Screen  What Year? 0 points 0 points 0 points  What month? 0 points 0 points 0 points  What time? 0 points 0 points 0 points  Count back from 20 0 points 0 points 0 points  Months in reverse 0 points 0 points 0 points  Repeat phrase 0 points 0 points 0 points  Total Score 0 points 0 points 0 points    Immunizations Immunization History  Administered Date(s) Administered   Fluad Quad(high Dose 65+) 08/14/2019, 09/22/2020   Influenza Split 09/26/2012   Influenza, High Dose Seasonal PF 09/02/2015, 08/24/2016, 09/04/2018, 09/07/2021, 09/13/2022   Influenza,inj,Quad PF,6+ Mos 09/25/2013, 09/12/2014   Influenza-Unspecified 09/15/2017, 09/05/2018, 09/01/2023   PFIZER(Purple Top)SARS-COV-2 Vaccination 01/11/2020, 02/01/2020, 08/29/2020, 04/17/2021   PNEUMOCOCCAL CONJUGATE-20 09/07/2021   Pfizer Covid-19 Vaccine Bivalent Booster 16yrs & up 09/07/2021, 09/20/2022    Pneumococcal Conjugate-13 12/13/2013   Pneumococcal Polysaccharide-23 02/09/2016   Respiratory Syncytial Virus Vaccine,Recomb Aduvanted(Arexvy) 09/13/2022   Tdap 01/13/2016   Unspecified SARS-COV-2 Vaccination 09/01/2023   Zoster Recombinant(Shingrix) 12/21/2017, 02/24/2018    TDAP status: Up to date  Flu Vaccine status: Up to date  Pneumococcal vaccine status: Up to date  Covid-19 vaccine status: Completed vaccines  Qualifies for Shingles Vaccine? Yes   Zostavax completed Yes   Shingrix Completed?: Yes  Screening Tests Health Maintenance  Topic Date Due   Colonoscopy  11/29/2023   COVID-19 Vaccine (8 - 2023-24 season) 12/27/2023 (Originally 10/27/2023)   Medicare Annual Wellness (AWV)  10/26/2024   DTaP/Tdap/Td (2 - Td or Tdap) 01/12/2026   Pneumonia Vaccine 34+ Years old  Completed   INFLUENZA VACCINE  Completed   Zoster Vaccines- Shingrix  Completed   HPV VACCINES  Aged Out   Hepatitis C Screening  Discontinued    Health Maintenance  Health Maintenance Due  Topic Date Due   Colonoscopy  11/29/2023    Colorectal cancer screening: No longer required.   Lung Cancer Screening: (Low Dose CT Chest recommended if Age 61-80 years, 20 pack-year currently smoking OR have quit w/in 15years.) does not qualify.   Lung Cancer Screening Referral: no  Additional Screening:  Hepatitis C Screening: does not qualify; Completed 10/28/2020  Vision Screening: Recommended annual ophthalmology exams for early detection of glaucoma and other disorders of the eye. Is the patient up to date with their annual eye exam?  Yes  Who is the provider or what is the name of the office in which the patient attends annual eye exams? Dr Burgess Estelle If pt is not established with a provider, would they like to be referred to a provider to establish care? No .   Dental Screening: Recommended annual dental exams for proper oral hygiene  Diabetic Foot Exam: n/a  Community Resource Referral / Chronic  Care Management: CRR required this visit?  No   CCM required this visit?  No    Plan:     I have  personally reviewed and noted the following in the patient's chart:   Medical and social history Use of alcohol, tobacco or illicit drugs  Current medications and supplements including opioid prescriptions. Patient is not currently taking opioid prescriptions. Functional ability and status Nutritional status Physical activity Advanced directives List of other physicians Hospitalizations, surgeries, and ER visits in previous 12 months Vitals Screenings to include cognitive, depression, and falls Referrals and appointments  In addition, I have reviewed and discussed with patient certain preventive protocols, quality metrics, and best practice recommendations. A written personalized care plan for preventive services as well as general preventive health recommendations were provided to patient.   Sue Lush, LPN   [id number]   After Visit Summary: (MyChart) Due to this being a telephonic visit, the after visit summary with patients personalized plan was offered to patient via MyChart   Nurse Notes: Pt states he is doing alright. He does relays he is experiencing some hearing loss and needs a referral to Audiology. Pt also relays he needs a refill of Fluticasone Diskus (pended to PCP). He has no other concerns or any questions at this time.

## 2023-11-01 ENCOUNTER — Encounter: Payer: Medicare Other | Admitting: Cardiology

## 2023-11-02 DIAGNOSIS — R972 Elevated prostate specific antigen [PSA]: Secondary | ICD-10-CM | POA: Diagnosis not present

## 2023-11-03 ENCOUNTER — Encounter: Payer: Medicare Other | Admitting: Family Medicine

## 2023-11-04 ENCOUNTER — Other Ambulatory Visit (INDEPENDENT_AMBULATORY_CARE_PROVIDER_SITE_OTHER): Payer: Medicare Other

## 2023-11-04 DIAGNOSIS — D508 Other iron deficiency anemias: Secondary | ICD-10-CM | POA: Diagnosis not present

## 2023-11-04 DIAGNOSIS — E559 Vitamin D deficiency, unspecified: Secondary | ICD-10-CM | POA: Diagnosis not present

## 2023-11-04 DIAGNOSIS — E785 Hyperlipidemia, unspecified: Secondary | ICD-10-CM | POA: Diagnosis not present

## 2023-11-04 LAB — COMPREHENSIVE METABOLIC PANEL
ALT: 14 U/L (ref 0–53)
AST: 19 U/L (ref 0–37)
Albumin: 4.3 g/dL (ref 3.5–5.2)
Alkaline Phosphatase: 47 U/L (ref 39–117)
BUN: 26 mg/dL — ABNORMAL HIGH (ref 6–23)
CO2: 28 meq/L (ref 19–32)
Calcium: 10.1 mg/dL (ref 8.4–10.5)
Chloride: 106 meq/L (ref 96–112)
Creatinine, Ser: 1.68 mg/dL — ABNORMAL HIGH (ref 0.40–1.50)
GFR: 38.12 mL/min — ABNORMAL LOW (ref >60.00)
Glucose, Bld: 96 mg/dL (ref 70–99)
Potassium: 4.6 meq/L (ref 3.5–5.1)
Sodium: 141 meq/L (ref 135–145)
Total Bilirubin: 0.8 mg/dL (ref 0.2–1.2)
Total Protein: 6.9 g/dL (ref 6.0–8.3)

## 2023-11-04 LAB — CBC WITH DIFFERENTIAL/PLATELET
Basophils Absolute: 0 10*3/uL (ref 0.0–0.1)
Basophils Relative: 0.7 % (ref 0.0–3.0)
Eosinophils Absolute: 0.3 10*3/uL (ref 0.0–0.7)
Eosinophils Relative: 7.7 % — ABNORMAL HIGH (ref 0.0–5.0)
HCT: 35.7 % — ABNORMAL LOW (ref 39.0–52.0)
Hemoglobin: 12 g/dL — ABNORMAL LOW (ref 13.0–17.0)
Lymphocytes Relative: 24.7 % (ref 12.0–46.0)
Lymphs Abs: 1 10*3/uL (ref 0.7–4.0)
MCHC: 33.7 g/dL (ref 30.0–36.0)
MCV: 94.6 fL (ref 78.0–100.0)
Monocytes Absolute: 0.4 10*3/uL (ref 0.1–1.0)
Monocytes Relative: 10 % (ref 3.0–12.0)
Neutro Abs: 2.4 10*3/uL (ref 1.4–7.7)
Neutrophils Relative %: 56.9 % (ref 43.0–77.0)
Platelets: 163 10*3/uL (ref 150.0–400.0)
RBC: 3.77 Mil/uL — ABNORMAL LOW (ref 4.22–5.81)
RDW: 14.7 % (ref 11.5–15.5)
WBC: 4.2 10*3/uL (ref 4.0–10.5)

## 2023-11-04 LAB — IBC + FERRITIN
Ferritin: 41.5 ng/mL (ref 22.0–322.0)
Iron: 55 ug/dL (ref 42–165)
Saturation Ratios: 13.7 % — ABNORMAL LOW (ref 20.0–50.0)
TIBC: 401.8 ug/dL (ref 250.0–450.0)
Transferrin: 287 mg/dL (ref 212.0–360.0)

## 2023-11-04 LAB — LIPID PANEL
Cholesterol: 138 mg/dL (ref 0–200)
HDL: 54.8 mg/dL (ref >39.00)
LDL Cholesterol: 71 mg/dL (ref 0–99)
NonHDL: 82.88
Total CHOL/HDL Ratio: 3
Triglycerides: 57 mg/dL (ref 0.0–149.0)
VLDL: 11.4 mg/dL (ref 0.0–40.0)

## 2023-11-04 LAB — VITAMIN D 25 HYDROXY (VIT D DEFICIENCY, FRACTURES): VITD: 41.45 ng/mL (ref 30.00–100.00)

## 2023-11-04 NOTE — Progress Notes (Signed)
No critical labs need to be addressed urgently. We will discuss labs in detail at upcoming office visit.   

## 2023-11-09 ENCOUNTER — Ambulatory Visit: Payer: Medicare Other | Admitting: Adult Health

## 2023-11-09 DIAGNOSIS — R972 Elevated prostate specific antigen [PSA]: Secondary | ICD-10-CM | POA: Diagnosis not present

## 2023-11-09 DIAGNOSIS — N5201 Erectile dysfunction due to arterial insufficiency: Secondary | ICD-10-CM | POA: Diagnosis not present

## 2023-11-09 DIAGNOSIS — R3912 Poor urinary stream: Secondary | ICD-10-CM | POA: Diagnosis not present

## 2023-11-09 DIAGNOSIS — N401 Enlarged prostate with lower urinary tract symptoms: Secondary | ICD-10-CM | POA: Diagnosis not present

## 2023-11-10 ENCOUNTER — Ambulatory Visit (HOSPITAL_COMMUNITY): Payer: Medicare Other | Attending: Cardiology

## 2023-11-10 DIAGNOSIS — E782 Mixed hyperlipidemia: Secondary | ICD-10-CM | POA: Diagnosis not present

## 2023-11-10 DIAGNOSIS — I340 Nonrheumatic mitral (valve) insufficiency: Secondary | ICD-10-CM | POA: Diagnosis not present

## 2023-11-10 DIAGNOSIS — I2510 Atherosclerotic heart disease of native coronary artery without angina pectoris: Secondary | ICD-10-CM | POA: Diagnosis not present

## 2023-11-10 DIAGNOSIS — I350 Nonrheumatic aortic (valve) stenosis: Secondary | ICD-10-CM | POA: Insufficient documentation

## 2023-11-10 DIAGNOSIS — I5032 Chronic diastolic (congestive) heart failure: Secondary | ICD-10-CM | POA: Insufficient documentation

## 2023-11-10 LAB — ECHOCARDIOGRAM COMPLETE
AR max vel: 1.1 cm2
AV Area VTI: 0.97 cm2
AV Area mean vel: 1.01 cm2
AV Mean grad: 25 mm[Hg]
AV Peak grad: 43.4 mm[Hg]
Ao pk vel: 3.3 m/s
Area-P 1/2: 3.12 cm2
P 1/2 time: 651 ms
S' Lateral: 3.4 cm

## 2023-11-11 ENCOUNTER — Encounter: Payer: Self-pay | Admitting: Family Medicine

## 2023-11-11 ENCOUNTER — Ambulatory Visit: Payer: Medicare Other | Admitting: Family Medicine

## 2023-11-11 VITALS — BP 130/68 | HR 80 | Temp 98.4°F | Ht 68.0 in | Wt 192.5 lb

## 2023-11-11 DIAGNOSIS — I25119 Atherosclerotic heart disease of native coronary artery with unspecified angina pectoris: Secondary | ICD-10-CM | POA: Diagnosis not present

## 2023-11-11 DIAGNOSIS — I480 Paroxysmal atrial fibrillation: Secondary | ICD-10-CM

## 2023-11-11 DIAGNOSIS — I10 Essential (primary) hypertension: Secondary | ICD-10-CM | POA: Diagnosis not present

## 2023-11-11 DIAGNOSIS — I5032 Chronic diastolic (congestive) heart failure: Secondary | ICD-10-CM | POA: Diagnosis not present

## 2023-11-11 DIAGNOSIS — E785 Hyperlipidemia, unspecified: Secondary | ICD-10-CM

## 2023-11-11 DIAGNOSIS — J301 Allergic rhinitis due to pollen: Secondary | ICD-10-CM

## 2023-11-11 DIAGNOSIS — J4521 Mild intermittent asthma with (acute) exacerbation: Secondary | ICD-10-CM | POA: Diagnosis not present

## 2023-11-11 DIAGNOSIS — Z0000 Encounter for general adult medical examination without abnormal findings: Secondary | ICD-10-CM

## 2023-11-11 DIAGNOSIS — N289 Disorder of kidney and ureter, unspecified: Secondary | ICD-10-CM | POA: Insufficient documentation

## 2023-11-11 DIAGNOSIS — E559 Vitamin D deficiency, unspecified: Secondary | ICD-10-CM

## 2023-11-11 MED ORDER — FLUTICASONE PROPIONATE DISKUS 100 MCG/ACT IN AEPB
1.0000 | INHALATION_SPRAY | Freq: Two times a day (BID) | RESPIRATORY_TRACT | 11 refills | Status: DC | PRN
Start: 2023-11-11 — End: 2023-12-01

## 2023-11-11 NOTE — Assessment & Plan Note (Addendum)
Followed by Dr. Excell Seltzer and Dr. Elberta Fortis Status post pacemaker On Eliquis anticoagulation   Scheduled for  ablation in 01/2024

## 2023-11-11 NOTE — Assessment & Plan Note (Signed)
Chronic, well-controlled.  Refilled Pulmicort to use during asthma exacerbations along with rescue inhaler albuterol as needed.

## 2023-11-11 NOTE — Progress Notes (Signed)
Patient ID: Derrick [name], male    DOB: [birth year], 82 y.o.   MRN: [id number]  This visit was conducted in person.  BP 130/68 (BP Location: Left Arm, Patient Position: Sitting, Cuff Size: Large)   Pulse 80   Temp 98.4 F (36.9 C) (Temporal)   Ht 5\' 8"  (1.727 m)   Wt 192 lb 8 oz (87.3 kg)   SpO2 99%   BMI 29.27 kg/m    CC:  Chief Complaint  Patient presents with   Medicare Wellness    Subjective:   HPI: Derrick [name] is a 82 y.o. male presenting on 11/11/2023 for Medicare Wellness  The patient presents for annual medicare wellness, complete physical and review of chronic health problems. He/She also has the following acute concerns today:  I have personally reviewed the Medicare Annual Wellness questionnaire and have noted 1. The patient's medical and social history 2. Their use of alcohol, tobacco or illicit drugs 3. Their current medications and supplements 4. The patient's functional ability including ADL's, fall risks, home safety risks and hearing or visual             impairment. 5. Diet and physical activities 6. Evidence for depression or mood disorders 7.         Updated provider list Cognitive evaluation was performed and recorded on pt medicare questionnaire form. The patients weight, height, BMI and visual acuity have been recorded in the chart   I have made referrals, counseling and provided education to the patient based review of the above and I have provided the pt with a written personalized care plan for preventive services.    Documentation of this information was scanned into the electronic record under the media tab.   Advance directives and end of life planning reviewed in detail with patient and documented in EMR. Patient given handout on advance care directives if needed. HCPOA and living will updated if needed.  Hearing Screening  Method: Audiometry   500Hz  1000Hz  2000Hz  4000Hz   Right ear 20 20 20 20   Left ear 20 20 20  0  Vision  Screening - Comments:: Wears Glasses-Eye Exam with Dr. Burgess Estelle at [facility] 2 weeks ago    CAD,Paroxsysmal Afib/ heart block: Followed by Dr. Excell Seltzer and Dr.Camnitz  S/P  Biventricular pacemaker  On eliquis anticoagulation.  Has been in afib since June and scheduled in 01/2024 for ablation.  Elevated Cholesterol: Well-controlled Lab Results  Component Value Date   CHOL 138 11/04/2023   HDL 54.80 11/04/2023   LDLCALC 71 11/04/2023   TRIG 57.0 11/04/2023   CHOLHDL 3 11/04/2023  Using medications without problems:none Muscle aches: none Diet compliance: heart healthy diet Exercise: walking 2 miles daily Other complaints:     Hypertension:   Well controlled.  BP Readings from Last 3 Encounters:  11/11/23 130/68  10/03/23 (!) 130/50  09/29/23 (!) 140/80  Using medication without problems or lightheadedness:  none Chest pain with exertion: none Edema:none Short of breath: none Average home BPs: Other issues:  Anemia: followed by Dr. Bertis Ruddy, hemoglobin stable a with slightly decreased  New finding of decreased creatinine.  No new meds.  Patient states he keeps up with water intake and is not taking nonsteroidal anti-inflammatories.  Will reevaluate in 3 months.  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 Minimal risk of multiple myeloma.. released from  Oncologist.  Neuropathy followed by neurology... Has contributed to falls recently.  Discussed with patient using cane.  DISH: diffuse.. Pain in right knee, hands, back, ankles  Followed by Rheum (Dr. Mallie Mussel).  Daily exercise.   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 Rarely using albuterol. Only issue is when he gets lung infection.. Needs inhaler.   OSA on CPAP.    Hx of colon cancer 2000   Relevant past medical, surgical, family and social history reviewed and  updated as indicated. Interim medical history since our last visit reviewed. Allergies and medications reviewed and updated. Outpatient Medications Prior to Visit  Medication Sig Dispense Refill   albuterol (VENTOLIN HFA) 108 (90 Base) MCG/ACT inhaler INHALE 2 PUFFS BY MOUTH EVERY 6 HOURS AS NEEDED FOR WHEEZING FOR SHORTNESS OF BREATH 9 g 0   amoxicillin (AMOXIL) 500 MG capsule TAKE 2 TABLETS BY MOUTH 1 HOUR PRIOR TO DENTAL APPOINTMENT AND 2 TABLETS IMMEDIATELY FOLLOWING DENTAL APPOINTMENT     apixaban (ELIQUIS) 5 MG TABS tablet Take 1 tablet (5 mg total) by mouth 2 (two) times daily. 180 tablet 1   bimatoprost (LUMIGAN) 0.01 % SOLN Place 1 drop into both eyes at bedtime.     Cholecalciferol 25 MCG (1000 UT) tablet Take 1,000 Units by mouth daily.      diclofenac Sodium (VOLTAREN) 1 % GEL Apply 2 g topically 4 (four) times daily as needed (pain).     dorzolamide-timolol (COSOPT) 22.3-6.8 MG/ML ophthalmic solution Place 1 drop into both eyes 2 (two) times daily.      finasteride (PROSCAR) 5 MG tablet Take 5 mg by mouth daily after breakfast.      fluticasone (FLONASE) 50 MCG/ACT nasal spray Place 2 sprays into both nostrils 2 (two) times daily.     Multiple Vitamin (MULTIVITAMIN WITH MINERALS) TABS tablet Take 1 tablet by mouth daily.     pantoprazole (PROTONIX) 40 MG tablet Take 1 tablet (40 mg total) by mouth 2 (two) times daily. 60 tablet 5   Polyethyl Glycol-Propyl Glycol (SYSTANE OP) Place 1 drop into both eyes daily as needed (dry eyes).     rosuvastatin (CRESTOR) 20 MG tablet Take 1 tablet by mouth once daily 90 tablet 2   sacubitril-valsartan (ENTRESTO) 24-26 MG Take 1 tablet by mouth 2 (two) times daily. 180 tablet 3   tadalafil (CIALIS) 5 MG tablet Take 5 mg by mouth at bedtime.      triamterene-hydrochlorothiazide (MAXZIDE-25) 37.5-25 MG tablet Take 1 tablet by mouth once daily 90 tablet 3   Fluticasone Propionate, Inhal, (FLUTICASONE PROPIONATE DISKUS) 100 MCG/ACT AEPB Use as directed  1 puff in the mouth or throat 2 (two) times daily as needed. 60 each 0   No facility-administered medications prior to visit.     Per HPI unless specifically indicated in ROS section below Review of Systems  Constitutional:  Negative for fatigue and fever.  HENT:  Negative for ear pain.   Eyes:  Negative for pain.  Respiratory:  Negative for cough and shortness of breath.   Cardiovascular:  Negative for chest pain, palpitations and leg swelling.  Gastrointestinal:  Negative for abdominal pain.  Genitourinary:  Negative for dysuria.  Musculoskeletal:  Negative for arthralgias.  Neurological:  Negative for syncope, light-headedness and headaches.  Psychiatric/Behavioral:  Negative for dysphoric mood.    Objective:  BP 130/68 (BP Location: Left Arm, Patient Position: Sitting, Cuff Size: Large)   Pulse 80   Temp 98.4 F (36.9 C) (Temporal)   Ht 5\' 8"  (  1.727 m)   Wt 192 lb 8 oz (87.3 kg)   SpO2 99%   BMI 29.27 kg/m   Wt Readings from Last 3 Encounters:  11/11/23 192 lb 8 oz (87.3 kg)  10/27/23 189 lb (85.7 kg)  10/03/23 189 lb (85.7 kg)      Physical Exam Constitutional:      General: He is not in acute distress.    Appearance: Normal appearance. He is well-developed. He is not ill-appearing or toxic-appearing.  HENT:     Head: Normocephalic and atraumatic.     Right Ear: Hearing, tympanic membrane, ear canal and external ear normal.     Left Ear: Hearing, tympanic membrane, ear canal and external ear normal.     Nose: Nose normal.     Mouth/Throat:     Pharynx: Uvula midline.  Eyes:     General: Lids are normal. Lids are everted, no foreign bodies appreciated.     Conjunctiva/sclera: Conjunctivae normal.     Pupils: Pupils are equal, round, and reactive to light.  Neck:     Thyroid: No thyroid mass or thyromegaly.     Vascular: No carotid bruit.     Trachea: Trachea and phonation normal.  Cardiovascular:     Rate and Rhythm: Normal rate and regular rhythm.      Pulses: Normal pulses.     Heart sounds: S1 normal and S2 normal. No murmur heard.    No gallop.  Pulmonary:     Breath sounds: Normal breath sounds. No wheezing, rhonchi or rales.  Abdominal:     General: Bowel sounds are normal.     Palpations: Abdomen is soft.     Tenderness: There is no abdominal tenderness. There is no guarding or rebound.     Hernia: No hernia is present.  Musculoskeletal:     Cervical back: Normal range of motion and neck supple.  Lymphadenopathy:     Cervical: No cervical adenopathy.  Skin:    General: Skin is warm and dry.     Findings: No rash.  Neurological:     Mental Status: He is alert.     Cranial Nerves: No cranial nerve deficit.     Sensory: No sensory deficit.     Gait: Gait normal.     Deep Tendon Reflexes: Reflexes are normal and symmetric.  Psychiatric:        Speech: Speech normal.        Behavior: Behavior normal.        Judgment: Judgment normal.       Results for orders placed or performed in visit on 11/10/23  ECHOCARDIOGRAM COMPLETE  Result Value Ref Range   Area-P 1/2 3.12 cm2   S' Lateral 3.40 cm   AV Area mean vel 1.01 cm2   AR max vel 1.10 cm2   AV Area VTI 0.97 cm2   P 1/2 time 651 msec   Ao pk vel 3.30 m/s   AV Mean grad 25.0 mmHg   AV Peak grad 43.4 mmHg   Est EF 55 - 60%    *Note: Due to a large number of results and/or encounters for the requested time period, some results have not been displayed. A complete set of results can be found in Results Review.     COVID 19 screen:  No recent travel or known exposure to COVID19 The patient denies respiratory symptoms of COVID 19 at this time. The importance of social distancing was discussed today.   Assessment and Plan  The patient's preventative maintenance and recommended screening tests for an annual wellness exam were reviewed in full today. Brought up to date unless services declined.  Counselled on the importance of diet, exercise, and its role in overall  health and mortality. The patient's FH and SH was reviewed, including their home life, tobacco status, and drug and alcohol status.   Smoking Status: nonsmoker ETOH/ drug use:  minimal/ none Vaccines: TDap, PNA uptodate, S/P COVID x 5, flu, PNA  20 every 5 years repeat given sarcoid  RSV 09/13/2022 Prostate Cancer Screen: BPH, hx of biopsy. PSA Followed by Dr. Laverle Patter Colon Cancer Screen:  Dr. Rhea Belton Hx of colon cancer 11/2020 Colonoscopy on 3 year recall.... scheduled Hep C:  neg.   Problem List Items Addressed This Visit     (HFpEF) heart failure with preserved ejection fraction ([name])    Recent echo showed preserved ejection fraction.  Followed by cardiology      Acute renal insufficiency    New finding of decreased creatinine.  No new meds. patient states he keeps up with water intake and is not taking nonsteroidal anti-inflammatories.  Will reevaluate in 3 months.       Relevant Orders   Renal Function Panel   Allergic rhinitis   Relevant Medications   Fluticasone Propionate, Inhal, (FLUTICASONE PROPIONATE DISKUS) 100 MCG/ACT AEPB   Asthma, mild intermittent    Chronic, well-controlled.  Refilled Pulmicort to use during asthma exacerbations along with rescue inhaler albuterol as needed.      Relevant Medications   Fluticasone Propionate, Inhal, (FLUTICASONE PROPIONATE DISKUS) 100 MCG/ACT AEPB   Atrial fibrillation ([name])    Followed by Dr. Excell Seltzer and Dr. Elberta Fortis Status post pacemaker On Eliquis anticoagulation   Scheduled for  ablation in 01/2024      Coronary artery disease involving native coronary artery of native heart with angina pectoris ([name])     Followed by cardiology      Essential hypertension, benign    Stable, chronic.  Continue current medication.  Entresto 24/26 mg 1 tablet twice daily, Maxide 37.5/25 mg 1 tablet daily      Hyperlipidemia with target LDL less than 100    Well-controlled on Crestor 20 mg daily      Vitamin D deficiency    Now  resolved on supplementation.      Other Visit Diagnoses     Medicare annual wellness visit, subsequent    -  Primary        Kerby Nora, MD

## 2023-11-11 NOTE — Assessment & Plan Note (Signed)
Well controlled on Crestor 20 mg daily.

## 2023-11-11 NOTE — Assessment & Plan Note (Signed)
Stable, chronic.  Continue current medication.  Entresto 24/26 mg 1 tablet twice daily, Maxide 37.5/25 mg 1 tablet daily

## 2023-11-11 NOTE — Assessment & Plan Note (Signed)
Now resolved on supplementation.

## 2023-11-11 NOTE — Assessment & Plan Note (Signed)
New finding of decreased creatinine.  No new meds. patient states he keeps up with water intake and is not taking nonsteroidal anti-inflammatories.  Will reevaluate in 3 months.

## 2023-11-11 NOTE — Assessment & Plan Note (Addendum)
Recent echo showed preserved ejection fraction.  Followed by cardiology

## 2023-11-11 NOTE — Assessment & Plan Note (Signed)
Followed by cardiology 

## 2023-11-14 ENCOUNTER — Ambulatory Visit (AMBULATORY_SURGERY_CENTER): Payer: Medicare Other | Admitting: *Deleted

## 2023-11-14 VITALS — Ht 69.0 in | Wt 185.0 lb

## 2023-11-14 DIAGNOSIS — Z85038 Personal history of other malignant neoplasm of large intestine: Secondary | ICD-10-CM

## 2023-11-14 DIAGNOSIS — R194 Change in bowel habit: Secondary | ICD-10-CM

## 2023-11-14 DIAGNOSIS — R112 Nausea with vomiting, unspecified: Secondary | ICD-10-CM

## 2023-11-14 DIAGNOSIS — R1032 Left lower quadrant pain: Secondary | ICD-10-CM

## 2023-11-14 MED ORDER — NA SULFATE-K SULFATE-MG SULF 17.5-3.13-1.6 GM/177ML PO SOLN: 1.0000 | Freq: Once | ORAL | 0 refills | Status: AC

## 2023-11-14 NOTE — Progress Notes (Signed)
Pt's name and DOB verified at the beginning of the pre-visit wit 2 identifiers  Pt denies any difficulty with ambulating,sitting, laying down or rolling side to side  Pt has issues with ambulation   Pt has no issues moving head neck or swallowing  No egg or soy allergy known to patient   No issues known to pt with past sedation with any surgeries or procedures  Pt denies having issues being intubated  No FH of Malignant Hyperthermia  Pt is not on diet pills or shots  Pt is not on home 02   Pt is  on blood thinners  2 day Hold  Pt denies issues with constipation   Pt is not on dialysis  Pt currently has PAFIB has Ablation scheduled for @/18/25 and has a Biventricular Pacemaker  Pt denies any upcoming cardiac testing  Pt encouraged to use to use Singlecare or Goodrx to reduce cost   Patient's chart reviewed by Cathlyn Parsons CNRA prior to pre-visit and patient appropriate for the [facility].  Pre-visit completed and red dot placed by patient's name on their procedure day (on provider's schedule).  .  Visit by phone  Pt states weight is 185 lb  Instructed pt why it is important to and  to call if they have any changes in health or new medications. Directed them to the # given and on instructions.     Instructions reviewed. Pt given both [facility] main # and MD on call # prior to instructions.  Pt states understanding. Instructed to review again prior to procedure. Pt states they will.   Instructions sent by mail with coupon and by My Chart  Instructions and coupon given to pt   Coupon sent via text to mobile phone and pt verified they received it

## 2023-11-15 ENCOUNTER — Encounter: Payer: Self-pay | Admitting: Family Medicine

## 2023-11-15 NOTE — Telephone Encounter (Signed)
Please submit PA for Flovent Diskus.

## 2023-11-17 ENCOUNTER — Telehealth: Payer: Self-pay

## 2023-11-17 ENCOUNTER — Other Ambulatory Visit (HOSPITAL_COMMUNITY): Payer: Self-pay

## 2023-11-17 NOTE — Telephone Encounter (Signed)
Pharmacy Patient Advocate Encounter   Received notification from Patient Advice Request messages that prior authorization for Fluticasone Propionate Diskus 100MCG/ACT aerosol powder is required/requested.   Insurance verification completed.   The patient is insured through [facility] .   Per test claim: PA required; PA submitted to above mentioned insurance via CoverMyMeds Key/confirmation #/EOC [id number] Status is pending

## 2023-11-18 ENCOUNTER — Encounter: Payer: Self-pay | Admitting: *Deleted

## 2023-11-18 NOTE — Telephone Encounter (Signed)
Pharmacy Patient Advocate Encounter  Received notification from [facility] that Prior Authorization for  Fluticasone Propionate Diskus 100MCG/ACT aerosol powder  has been DENIED.  Full denial letter will be uploaded to the media tab. See denial reason below.   PA #/Case ID/Reference #: [id number]

## 2023-11-18 NOTE — Telephone Encounter (Signed)
Mr. Magoon notified as instructed by telephone.  He has not tried the Qvar or Arnuity but is agreeable to which ever one Dr Ermalene Searing recommends.  [facility] [address].

## 2023-11-18 NOTE — Telephone Encounter (Signed)
Let patient know the fluticasone was denied is you willing to try Qvar or Arnuity instead?  Or has he tried these in the past?

## 2023-11-22 MED ORDER — ARNUITY ELLIPTA 100 MCG/ACT IN AEPB: 1.0000 | INHALATION_SPRAY | Freq: Two times a day (BID) | RESPIRATORY_TRACT | 11 refills | Status: DC

## 2023-11-22 NOTE — Telephone Encounter (Signed)
Mr. Dash notified by telephone that Dr. Ermalene Searing sent in Rx for Arnuity to his pharmacy.

## 2023-11-22 NOTE — Telephone Encounter (Signed)
Let patient know prescription for Arnuity has been sent to [facility].

## 2023-11-22 NOTE — Addendum Note (Signed)
Addended by: Kerby Nora E on: 11/22/2023 10:19 AM   Modules accepted: Orders

## 2023-11-30 DIAGNOSIS — L821 Other seborrheic keratosis: Secondary | ICD-10-CM | POA: Diagnosis not present

## 2023-11-30 DIAGNOSIS — D225 Melanocytic nevi of trunk: Secondary | ICD-10-CM | POA: Diagnosis not present

## 2023-11-30 DIAGNOSIS — L578 Other skin changes due to chronic exposure to nonionizing radiation: Secondary | ICD-10-CM | POA: Diagnosis not present

## 2023-11-30 DIAGNOSIS — Z85828 Personal history of other malignant neoplasm of skin: Secondary | ICD-10-CM | POA: Diagnosis not present

## 2023-11-30 DIAGNOSIS — L309 Dermatitis, unspecified: Secondary | ICD-10-CM | POA: Diagnosis not present

## 2023-11-30 DIAGNOSIS — L814 Other melanin hyperpigmentation: Secondary | ICD-10-CM | POA: Diagnosis not present

## 2023-12-01 ENCOUNTER — Ambulatory Visit: Payer: Medicare Other | Admitting: Internal Medicine

## 2023-12-01 ENCOUNTER — Encounter: Payer: Self-pay | Admitting: Internal Medicine

## 2023-12-01 VITALS — BP 103/67 | HR 60 | Temp 99.0°F | Resp 17 | Ht 69.0 in | Wt 185.0 lb

## 2023-12-01 DIAGNOSIS — R1032 Left lower quadrant pain: Secondary | ICD-10-CM

## 2023-12-01 DIAGNOSIS — R194 Change in bowel habit: Secondary | ICD-10-CM

## 2023-12-01 DIAGNOSIS — Z1211 Encounter for screening for malignant neoplasm of colon: Secondary | ICD-10-CM | POA: Diagnosis not present

## 2023-12-01 DIAGNOSIS — K5730 Diverticulosis of large intestine without perforation or abscess without bleeding: Secondary | ICD-10-CM | POA: Diagnosis not present

## 2023-12-01 DIAGNOSIS — D123 Benign neoplasm of transverse colon: Secondary | ICD-10-CM

## 2023-12-01 DIAGNOSIS — R112 Nausea with vomiting, unspecified: Secondary | ICD-10-CM

## 2023-12-01 DIAGNOSIS — Z980 Intestinal bypass and anastomosis status: Secondary | ICD-10-CM

## 2023-12-01 DIAGNOSIS — Z85038 Personal history of other malignant neoplasm of large intestine: Secondary | ICD-10-CM

## 2023-12-01 DIAGNOSIS — R6881 Early satiety: Secondary | ICD-10-CM | POA: Diagnosis not present

## 2023-12-01 DIAGNOSIS — D122 Benign neoplasm of ascending colon: Secondary | ICD-10-CM | POA: Diagnosis not present

## 2023-12-01 DIAGNOSIS — K635 Polyp of colon: Secondary | ICD-10-CM | POA: Diagnosis not present

## 2023-12-01 DIAGNOSIS — K648 Other hemorrhoids: Secondary | ICD-10-CM | POA: Diagnosis not present

## 2023-12-01 DIAGNOSIS — I509 Heart failure, unspecified: Secondary | ICD-10-CM | POA: Diagnosis not present

## 2023-12-01 DIAGNOSIS — J449 Chronic obstructive pulmonary disease, unspecified: Secondary | ICD-10-CM | POA: Diagnosis not present

## 2023-12-01 DIAGNOSIS — K2970 Gastritis, unspecified, without bleeding: Secondary | ICD-10-CM | POA: Diagnosis not present

## 2023-12-01 DIAGNOSIS — I2510 Atherosclerotic heart disease of native coronary artery without angina pectoris: Secondary | ICD-10-CM | POA: Diagnosis not present

## 2023-12-01 DIAGNOSIS — I4891 Unspecified atrial fibrillation: Secondary | ICD-10-CM | POA: Diagnosis not present

## 2023-12-01 MED ORDER — PANTOPRAZOLE SODIUM 40 MG PO TBEC: 40.0000 mg | DELAYED_RELEASE_TABLET | Freq: Every day | ORAL | 3 refills | Status: DC

## 2023-12-01 MED ORDER — SODIUM CHLORIDE 0.9 % IV SOLN: 500.0000 mL | INTRAVENOUS | Status: DC

## 2023-12-01 NOTE — Progress Notes (Signed)
Called to room to assist during endoscopic procedure.  Patient ID and intended procedure confirmed with present staff. Received instructions for my participation in the procedure from the performing physician.  

## 2023-12-01 NOTE — Progress Notes (Signed)
GASTROENTEROLOGY PROCEDURE H&P NOTE   Primary Care Physician: Excell Seltzer, MD    Reason for Procedure:  Early satiety, nausea with bloating, diarrhea and change in bowel habits  Plan:    EGD and colonoscopy  Patient is appropriate for endoscopic procedure(s) in the ambulatory ([facility]) setting.  The nature of the procedure, as well as the risks, benefits, and alternatives were carefully and thoroughly reviewed with the patient. Ample time for discussion and questions allowed. The patient understood, was satisfied, and agreed to proceed.     HPI: Derrick [name] is a 82 y.o. male who presents for EGD and colonoscopy.  Medical history as below.  Tolerated the prep.  No recent chest pain or shortness of breath.  No abdominal pain today.  Eliquis on hold x 2 days  Past Medical History:  Diagnosis Date   Anemia    iron deficiency anemia- infusion on 02/15/2020 and 02/22/2020   Atrial fibrillation ([name]) 09/2023   BENIGN PROSTATIC HYPERTROPHY, WITH OBSTRUCTION 05/28/2010   CAD (coronary artery disease)    a. s/p NSTEMI 06/01/11: DES to RCA;  b. cath 06/25/11:   dLM 50-60% (FFR 0.87), prox to mid LAD 40-50%, D1 50%, pCFX 50%, RCA stent ok, dPDA 80%, EF 55-60%.  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   DISH (diffuse idiopathic skeletal hyperostosis) 12/02/2015   Diverticulitis    GENERALIZED OSTEOARTHROSIS UNSPECIFIED SITE  05/28/2010   GERD 05/28/2010   Heart murmur    History of gout    History of kidney stones    HYPERLIPIDEMIA 05/28/2010   Hypertension    IgM lambda paraproteinemia    Intrinsic asthma, 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   "associated w/lung sarcoidosis"   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   MITRAL VALVE PROLAPSE 05/28/2010   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   NSTEMI (non-ST elevated myocardial infarction) ([name]) 06/03/2011   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   OSA on CPAP 05/28/2010   PALPITATIONS, CHRONIC 05/28/2010   Parotid gland pain 2010   infection   Peripheral neuropathy    "tx'd w/targeted chemo" (05/12/2016)   Pneumonia 2-3 times   Presence of permanent cardiac pacemaker    [employer]   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   Sarcoidosis    pulmonalis   Sleep apnea    UNSPECIFIED INFLAMMATORY AND TOXIC NEUROPATHY 05/28/2010   Vision loss    VITAMIN D DEFICIENCY 05/28/2010    Past Surgical History:  Procedure Laterality Date   APPENDECTOMY  1954   age 61   BIV UPGRADE N/A 09/23/2022   Procedure: BIV PACEMAKER UPGRADE;  Surgeon: Regan Lemming, MD;  Location: [facility] INVASIVE CV LAB;  Service: Cardiovascular;  Laterality: N/A;   CATARACT EXTRACTION W/ INTRAOCULAR LENS IMPLANT Left    COLON SURGERY  2000   decending colon    COLONOSCOPY     CORONARY ANGIOPLASTY WITH STENT  PLACEMENT  06/03/2011   CORONARY ARTERY BYPASS GRAFT N/A 06/05/2019   Procedure: CORONARY ARTERY BYPASS GRAFTING (CABG) TIMES THREE USING LEFT INTERNAL MAMMARY ARTERY AND RIGHT GREATER SEPHANOUS VEIN;  Surgeon: Kerin Perna, MD;  Location: [facility] OR;  Service: Open Heart Surgery;  Laterality: N/A;   CORONARY PRESSURE/FFR STUDY N/A 05/15/2019   Procedure: INTRAVASCULAR PRESSURE WIRE/FFR STUDY;  Surgeon: Tonny Bollman, MD;  Location: [facility] INVASIVE CV LAB;  Service: Cardiovascular;  Laterality: N/A;   ELECTROPHYSIOLOGIC STUDY N/A 05/12/2016   Procedure:  Cardioversion;  Surgeon: Will Jorja Loa, MD;  Location: [facility] CV LAB;  Service: Cardiovascular;  Laterality: N/A;   EP IMPLANTABLE DEVICE N/A 05/12/2016   Procedure: Pacemaker Implant;  Surgeon: Will Jorja Loa, MD;  Location: [facility] CV LAB;  Service: Cardiovascular;  Laterality: N/A;   EYE SURGERY     bilateral cataract with lens implant   HERNIA REPAIR  01/2019   left inguinal hernia repair   INGUINAL HERNIA REPAIR Left 02/21/2019   Procedure: LEFT INGUINAL HERNIA REPAIR WITH MESH;  Surgeon: Harriette Bouillon, MD;  Location: [facility] OR;  Service: General;  Laterality: Left;   INSERT / REPLACE / REMOVE PACEMAKER  05/12/2016   KNEE ARTHROSCOPY Right 2003   LEFT HEART CATH AND CORONARY ANGIOGRAPHY N/A 05/15/2019   Procedure: LEFT HEART CATH AND CORONARY ANGIOGRAPHY;  Surgeon: Tonny Bollman, MD;  Location: [facility] INVASIVE CV LAB;  Service: Cardiovascular;  Laterality: N/A;   LEFT HEART CATHETERIZATION WITH CORONARY ANGIOGRAM N/A 03/31/2015   Procedure: LEFT HEART CATHETERIZATION WITH CORONARY ANGIOGRAM;  Surgeon: Tonny Bollman, MD;  Location: [facility] CATH LAB;  Service: Cardiovascular;  Laterality: N/A;   LUNG SURGERY  2001   "open lung dissection"   MOHS SURGERY Left ~ 2008   ear   PROSTATE BIOPSY  2010   TEE WITHOUT CARDIOVERSION N/A 06/05/2019   Procedure: TRANSESOPHAGEAL ECHOCARDIOGRAM (TEE);  Surgeon: Donata Clay, Theron Arista, MD;  Location: [facility] OR;  Service: Open Heart Surgery;  Laterality: N/A;   TONSILLECTOMY  1950   TOTAL KNEE ARTHROPLASTY Right 03/11/2020   Procedure: TOTAL KNEE ARTHROPLASTY;  Surgeon: Durene Romans, MD;  Location: [facility] ORS;  Service: Orthopedics;  Laterality: Right;  70 mins    Prior to Admission medications   Medication Sig Start Date End Date Taking? Authorizing Provider  bimatoprost (LUMIGAN) 0.01 % SOLN Place 1 drop into both eyes at bedtime.   Yes [provider]  Cholecalciferol 25 MCG (1000 UT) tablet Take 1,000 Units by mouth daily.    Yes  [provider]  diclofenac Sodium (VOLTAREN) 1 % GEL Apply 2 g topically 4 (four) times daily as needed (pain).   Yes [provider]  dorzolamide-timolol (COSOPT) 22.3-6.8 MG/ML ophthalmic solution Place 1 drop into both eyes 2 (two) times daily.  09/30/15  Yes [provider]  finasteride (PROSCAR) 5 MG tablet Take 5 mg by mouth daily after breakfast.  10/13/12  Yes [provider]  fluticasone (FLONASE) 50 MCG/ACT nasal spray Place 2 sprays into both nostrils 2 (two) times daily.   Yes [provider]  Multiple Vitamin (MULTIVITAMIN WITH MINERALS) TABS tablet Take 1 tablet by mouth daily.   Yes [provider]  pantoprazole (PROTONIX) 40 MG tablet Take 1 tablet (40 mg total) by mouth 2 (two) times daily. 09/27/23  Yes Unk Lightning, PA  rosuvastatin (CRESTOR) 20 MG tablet Take 1 tablet by mouth once daily 04/11/23  Yes Tonny Bollman, MD  sacubitril-valsartan (ENTRESTO) 24-26 MG Take 1 tablet by  mouth 2 (two) times daily. 03/21/23  Yes Tonny Bollman, MD  tadalafil (CIALIS) 5 MG tablet Take 5 mg by mouth at bedtime.    Yes [provider]  triamcinolone cream (KENALOG) 0.1 % Apply 1 Application topically 2 (two) times daily. 11/30/23  Yes [provider]  triamterene-hydrochlorothiazide (MAXZIDE-25) 37.5-25 MG tablet Take 1 tablet by mouth once daily 07/12/23  Yes Excell Seltzer, Casimiro Needle, MD  albuterol (VENTOLIN HFA) 108 (90 Base) MCG/ACT inhaler INHALE 2 PUFFS BY MOUTH EVERY 6 HOURS AS NEEDED FOR WHEEZING FOR SHORTNESS OF BREATH 09/26/23   Bedsole, Amy E, MD  amoxicillin (AMOXIL) 500 MG capsule TAKE 2 TABLETS BY MOUTH 1 HOUR PRIOR TO DENTAL APPOINTMENT AND 2 TABLETS IMMEDIATELY FOLLOWING DENTAL APPOINTMENT Patient not taking: Reported on 11/14/2023 09/07/22   [provider]  apixaban (ELIQUIS) 5 MG TABS tablet Take 1 tablet (5 mg total) by mouth 2 (two) times daily. 04/21/23   Gaston Islam., NP  Fluticasone Furoate  (ARNUITY ELLIPTA) 100 MCG/ACT AEPB Inhale 1 puff into the lungs 2 (two) times daily. 11/22/23   Bedsole, Amy E, MD  Polyethyl Glycol-Propyl Glycol (SYSTANE OP) Place 1 drop into both eyes daily as needed (dry eyes).    [provider]    Current Outpatient Medications  Medication Sig Dispense Refill   bimatoprost (LUMIGAN) 0.01 % SOLN Place 1 drop into both eyes at bedtime.     Cholecalciferol 25 MCG (1000 UT) tablet Take 1,000 Units by mouth daily.      diclofenac Sodium (VOLTAREN) 1 % GEL Apply 2 g topically 4 (four) times daily as needed (pain).     dorzolamide-timolol (COSOPT) 22.3-6.8 MG/ML ophthalmic solution Place 1 drop into both eyes 2 (two) times daily.      finasteride (PROSCAR) 5 MG tablet Take 5 mg by mouth daily after breakfast.      fluticasone (FLONASE) 50 MCG/ACT nasal spray Place 2 sprays into both nostrils 2 (two) times daily.     Multiple Vitamin (MULTIVITAMIN WITH MINERALS) TABS tablet Take 1 tablet by mouth daily.     pantoprazole (PROTONIX) 40 MG tablet Take 1 tablet (40 mg total) by mouth 2 (two) times daily. 60 tablet 5   rosuvastatin (CRESTOR) 20 MG tablet Take 1 tablet by mouth once daily 90 tablet 2   sacubitril-valsartan (ENTRESTO) 24-26 MG Take 1 tablet by mouth 2 (two) times daily. 180 tablet 3   tadalafil (CIALIS) 5 MG tablet Take 5 mg by mouth at bedtime.      triamcinolone cream (KENALOG) 0.1 % Apply 1 Application topically 2 (two) times daily.     triamterene-hydrochlorothiazide (MAXZIDE-25) 37.5-25 MG tablet Take 1 tablet by mouth once daily 90 tablet 3   albuterol (VENTOLIN HFA) 108 (90 Base) MCG/ACT inhaler INHALE 2 PUFFS BY MOUTH EVERY 6 HOURS AS NEEDED FOR WHEEZING FOR SHORTNESS OF BREATH 9 g 0   amoxicillin (AMOXIL) 500 MG capsule TAKE 2 TABLETS BY MOUTH 1 HOUR PRIOR TO DENTAL APPOINTMENT AND 2 TABLETS IMMEDIATELY FOLLOWING DENTAL APPOINTMENT (Patient not taking: Reported on 11/14/2023)     apixaban (ELIQUIS) 5 MG TABS tablet Take 1 tablet (5 mg  total) by mouth 2 (two) times daily. 180 tablet 1   Fluticasone Furoate (ARNUITY ELLIPTA) 100 MCG/ACT AEPB Inhale 1 puff into the lungs 2 (two) times daily. 30 each 11   Polyethyl Glycol-Propyl Glycol (SYSTANE OP) Place 1 drop into both eyes daily as needed (dry eyes).     Current Facility-Administered Medications  Medication Dose  Route Frequency Provider Last Rate Last Admin   0.9 %  sodium chloride infusion  500 mL Intravenous Continuous Christabel Camire, Carie Caddy, MD        Allergies as of 12/01/2023 - Review Complete 12/01/2023  Allergen Reaction Noted   Nsaids  03/11/2020    Family History  Problem Relation Age of Onset   Arthritis Mother        severe   Coronary artery disease Mother    Colon polyps Mother    Heart disease Mother    Sleep apnea Mother    Sleep apnea Father    Colon polyps Brother    Coronary artery disease Brother    Cancer Brother        choriocarcinoma   Arrhythmia Brother 32       Afib/Tachycardia   Heart attack Brother    Congestive Heart Failure Brother    Sleep apnea Brother    Colon cancer Cousin    Crohn's disease Neg Hx    Esophageal cancer Neg Hx    Rectal cancer Neg Hx    Stomach cancer Neg Hx     Social History   Socioeconomic History   Marital status: Married    Spouse name: Not on file   Number of children: 2   Years of education: MS-Coll.   Highest education level: Not on file  Occupational History   Occupation: Consultant    Comment: Arts development officer  Tobacco Use   Smoking status: Never   Smokeless tobacco: Never   Tobacco comments:    Never smoked 09/29/23  Vaping Use   Vaping status: Never Used  Substance and Sexual Activity   Alcohol use: Yes    Comment: occasional   Drug use: No   Sexual activity: Yes    Birth control/protection: None  Other Topics Concern   Not on file  Social History Narrative   College- [employer]; [employer]-environment science and mgt. Married [name])  "78" 1 son- 62; 1 dtr "16" 2 grandchildren.  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 End of life-provided discussive context and provided packet.   Social Determinants of Health   Financial Resource Strain: Low Risk  (10/24/2023)   Overall Financial Resource Strain (CARDIA)    Difficulty of Paying Living Expenses: Not very hard  Food Insecurity: No Food Insecurity (10/24/2023)   Hunger Vital Sign    Worried About Running Out of Food in the Last Year: Never true    Ran Out of Food in the Last Year: Never true  Transportation Needs: No Transportation Needs (10/24/2023)   PRAPARE - Administrator, Civil Service (Medical): No    Lack of Transportation (Non-Medical): No  Physical Activity: Sufficiently Active (10/24/2023)   Exercise Vital Sign    Days of Exercise per Week: 7 days    Minutes of Exercise per Session: 30 min  Stress: No Stress Concern Present (10/24/2023)   [employer] of Occupational Health - Occupational Stress Questionnaire    Feeling of Stress : Not at all  Social Connections: Unknown (10/24/2023)   Social Connection and Isolation Panel [NHANES]    Frequency of Communication with Friends and Family: More than three times a week    Frequency of Social Gatherings with Friends and Family: More than three times a week    Attends Religious Services: Not on file    Active Member of Clubs or Organizations: Yes    Attends Banker Meetings: 1 to 4 times per year  Marital Status: Married  Catering manager Violence: Not At Risk (10/27/2023)   Humiliation, Afraid, Rape, and Kick questionnaire    Fear of Current or Ex-Partner: No    Emotionally Abused: No    Physically Abused: No    Sexually Abused: No    Physical Exam: Vital signs in last 24 hours: @BP  (!) 145/76   Pulse 62   Temp 99 F (37.2 C) (Temporal)   Ht 5\' 9"  (1.753 m)   Wt 185 lb (83.9 kg)   SpO2 99%   BMI 27.32 kg/m  GEN: NAD EYE: Sclerae anicteric ENT: MMM CV: Non-tachycardic Pulm: CTA b/l GI: Soft,  NT/ND NEURO:  Alert & Oriented x 3   Erick Blinks, MD [facility]  12/01/2023 2:47 PM

## 2023-12-01 NOTE — Op Note (Signed)
[facility] Patient Name: Derrick [name] Procedure Date: 12/01/2023 2:55 PM MRN: [id number] Endoscopist: Beverley Fiedler , MD, [id number] Age: 82 Referring MD:  Date of Birth: 09/28/1941 Gender: Male Account #: [id number] Procedure:                Colonoscopy Indications:              High risk colon cancer surveillance: Personal                            history of colon cancer and colon polyps, recent                            loose stools, abd bloating and left-sided abd pain,                            last colon 12/21 with 3 TAs removed, Positive fecal                            lactoferrin and fecal calprotectin elevated at 134. Medicines:                Monitored Anesthesia Care Procedure:                Pre-Anesthesia Assessment:                           - Prior to the procedure, a History and Physical                            was performed, and patient medications and                            allergies were reviewed. The patient's tolerance of                            previous anesthesia was also reviewed. The risks                            and benefits of the procedure and the sedation                            options and risks were discussed with the patient.                            All questions were answered, and informed consent                            was obtained. Prior Anticoagulants: The patient has                            taken Eliquis (apixaban), last dose was 2 days                            prior to procedure. ASA Grade Assessment: III - A  patient with severe systemic disease. After                            reviewing the risks and benefits, the patient was                            deemed in satisfactory condition to undergo the                            procedure.                           After obtaining informed consent, the colonoscope                            was passed under direct vision.  Throughout the                            procedure, the patient's blood pressure, pulse, and                            oxygen saturations were monitored continuously. The                            Olympus Scope SN [id number] was introduced through the                            anus and advanced to the cecum, identified by                            appendiceal orifice and ileocecal valve. The                            colonoscopy was performed without difficulty. The                            patient tolerated the procedure well. The quality                            of the bowel preparation was good. The ileocecal                            valve, appendiceal orifice, and rectum were                            photographed. Scope In: 3:07:29 PM Scope Out: 3:24:04 PM Scope Withdrawal Time: 0 hours 10 minutes 41 seconds  Total Procedure Duration: 0 hours 16 minutes 35 seconds  Findings:                 The digital rectal exam was normal.                           A 3 mm polyp was found in the ascending colon. The  polyp was sessile. The polyp was removed with a                            cold biopsy forceps. Resection and retrieval were                            complete.                           A 6 mm polyp was found in the transverse colon. The                            polyp was sessile. The polyp was removed with a                            cold snare. Resection and retrieval were complete.                           Multiple small-mouthed diverticula were found in                            the ascending colon.                           There was evidence of a prior end-to-end                            colo-colonic anastomosis in the sigmoid colon. This                            was patent and was characterized by healthy                            appearing mucosa.                           Internal hemorrhoids were found during                             retroflexion. The hemorrhoids were large. Complications:            No immediate complications. Estimated Blood Loss:     Estimated blood loss was minimal. Impression:               - One 3 mm polyp in the ascending colon, removed                            with a cold biopsy forceps. Resected and retrieved.                           - One 6 mm polyp in the transverse colon, removed                            with a cold snare. Resected and retrieved.                           -  Mild diverticulosis in the ascending colon.                           - Patent end-to-end colo-colonic anastomosis,                            characterized by healthy appearing mucosa.                           - Internal hemorrhoids. Recommendation:           - Patient has a contact number available for                            emergencies. The signs and symptoms of potential                            delayed complications were discussed with the                            patient. Return to normal activities tomorrow.                            Written discharge instructions were provided to the                            patient.                           - Resume previous diet.                           - Continue present medications.                           - Await pathology results.                           - No recommendation at this time regarding repeat                            colonoscopy due to age.                           - Await gastric biopsies but if unrevealing, would                            recommend rifaximin 550 mg 3 times daily x 14 days                            for IBS versus SIBO. Will likely need samples                            obtained from drug representative as this will  likely not be covered by Medicare.                           - Resume Eliquis (apixaban) at prior dose tomorrow.                            Refer to managing  physician for further adjustment                            of therapy. Beverley Fiedler, MD 12/01/2023 3:40:14 PM This report has been signed electronically.

## 2023-12-01 NOTE — Progress Notes (Signed)
Sedate, gd SR, tolerated procedure well, VSS, report to RN 

## 2023-12-01 NOTE — Progress Notes (Signed)
Pt's states no medical or surgical changes since previsit or office visit.  Pt states he has persistent atrial fibrillation since October 2024.

## 2023-12-01 NOTE — Progress Notes (Deleted)
Pt's states no medical or surgical changes since previsit or office visit. 

## 2023-12-01 NOTE — Patient Instructions (Signed)
Handouts given on polyps, hemorrhoids and diverticulosis.  YOU HAD AN ENDOSCOPIC PROCEDURE TODAY AT THE [facility]:   Refer to the procedure report that was given to you for any specific questions about what was found during the examination.  If the procedure report does not answer your questions, please call your gastroenterologist to clarify.  If you requested that your care partner not be given the details of your procedure findings, then the procedure report has been included in a sealed envelope for you to review at your convenience later.  YOU SHOULD EXPECT: Some feelings of bloating in the abdomen. Passage of more gas than usual.  Walking can help get rid of the air that was put into your GI tract during the procedure and reduce the bloating. If you had a lower endoscopy (such as a colonoscopy or flexible sigmoidoscopy) you may notice spotting of blood in your stool or on the toilet paper. If you underwent a bowel prep for your procedure, you may not have a normal bowel movement for a few days.  Please Note:  You might notice some irritation and congestion in your nose or some drainage.  This is from the oxygen used during your procedure.  There is no need for concern and it should clear up in a day or so.  SYMPTOMS TO REPORT IMMEDIATELY:  Following lower endoscopy (colonoscopy or flexible sigmoidoscopy):  Excessive amounts of blood in the stool  Significant tenderness or worsening of abdominal pains  Swelling of the abdomen that is new, acute  Fever of 100F or higher  Following upper endoscopy (EGD)  Vomiting of blood or coffee ground material  New chest pain or pain under the shoulder blades  Painful or persistently difficult swallowing  New shortness of breath  Fever of 100F or higher  Black, tarry-looking stools  For urgent or emergent issues, a gastroenterologist can be reached at any hour by calling [phone]. Do not use MyChart messaging for urgent  concerns.    DIET:  We do recommend a small meal at first, but then you may proceed to your regular diet.  Drink plenty of fluids but you should avoid alcoholic beverages for 24 hours.  ACTIVITY:  You should plan to take it easy for the rest of today and you should NOT DRIVE or use heavy machinery until tomorrow (because of the sedation medicines used during the test).    FOLLOW UP: Our staff will call the number listed on your records the next business day following your procedure.  We will call around 7:15- 8:00 am to check on you and address any questions or concerns that you may have regarding the information given to you following your procedure. If we do not reach you, we will leave a message.     If any biopsies were taken you will be contacted by phone or by letter within the next 1-3 weeks.  Please call us at [phone] if you have not heard about the biopsies in 3 weeks.    SIGNATURES/CONFIDENTIALITY: You and/or your care partner have signed paperwork which will be entered into your electronic medical record.  These signatures attest to the fact that that the information above on your After Visit Summary has been reviewed and is understood.  Full responsibility of the confidentiality of this discharge information lies with you and/or your care-partner.

## 2023-12-01 NOTE — Op Note (Signed)
[facility] Patient Name: Derrick [name] Procedure Date: 12/01/2023 2:56 PM MRN: [id number] Endoscopist: Beverley Fiedler , MD, [id number] Age: 82 Referring MD:  Date of Birth: December 21, 1941 Gender: Male Account #: [id number] Procedure:                Upper GI endoscopy Indications:              Abdominal pain in left abdomen, abdominal bloating,                            mild anemia, history of gastritis and hyperplastic                            gastric polyp, last EGD 12/21 Medicines:                Monitored Anesthesia Care Procedure:                Pre-Anesthesia Assessment:                           - Prior to the procedure, a History and Physical                            was performed, and patient medications and                            allergies were reviewed. The patient's tolerance of                            previous anesthesia was also reviewed. The risks                            and benefits of the procedure and the sedation                            options and risks were discussed with the patient.                            All questions were answered, and informed consent                            was obtained. Prior Anticoagulants: The patient has                            taken no anticoagulant or antiplatelet agents. ASA                            Grade Assessment: III - A patient with severe                            systemic disease. After reviewing the risks and                            benefits, the patient was deemed in satisfactory  condition to undergo the procedure.                           After obtaining informed consent, the endoscope was                            passed under direct vision. Throughout the                            procedure, the patient's blood pressure, pulse, and                            oxygen saturations were monitored continuously. The                            GIF [id number] #[id number] was  introduced through the                            mouth, and advanced to the second part of duodenum.                            The upper GI endoscopy was accomplished without                            difficulty. The patient tolerated the procedure                            well. Scope In: Scope Out: Findings:                 The examined esophagus was normal.                           Mild inflammation characterized by congestion                            (edema), granularity and nodularity was found in                            the stomach. Biopsies were taken with a cold                            forceps for Helicobacter pylori testing.                           The examined duodenum was normal. Biopsies for                            histology were taken with a cold forceps for                            evaluation of celiac disease. Complications:            No immediate complications. Estimated Blood Loss:     Estimated blood loss was minimal. Impression:               -  Normal esophagus.                           - Gastritis. Biopsied.                           - Normal examined duodenum. Biopsied. Recommendation:           - Patient has a contact number available for                            emergencies. The signs and symptoms of potential                            delayed complications were discussed with the                            patient. Return to normal activities tomorrow.                            Written discharge instructions were provided to the                            patient.                           - Resume previous diet.                           - Continue present medications.                           - Await pathology results.                           - See the other procedure note for documentation of                            additional recommendations. Beverley Fiedler, MD 12/01/2023 3:34:52 PM This report has been signed electronically.

## 2023-12-02 ENCOUNTER — Telehealth: Payer: Self-pay

## 2023-12-02 NOTE — Telephone Encounter (Signed)
  Follow up Call-     12/01/2023    2:14 PM  Call back number  Post procedure Call Back phone  # [phone]  Permission to leave phone message Yes     Patient questions:  Do you have a fever, pain , or abdominal swelling? No. Pain Score  0 *  Have you tolerated food without any problems? Yes.    Have you been able to return to your normal activities? Yes.    Do you have any questions about your discharge instructions: Diet   No. Medications  No. Follow up visit  No.  Do you have questions or concerns about your Care? No.  Actions: * If pain score is 4 or above: No action needed, pain <4.

## 2023-12-05 ENCOUNTER — Other Ambulatory Visit: Payer: Self-pay

## 2023-12-05 ENCOUNTER — Encounter: Payer: Self-pay | Admitting: Internal Medicine

## 2023-12-05 DIAGNOSIS — R109 Unspecified abdominal pain: Secondary | ICD-10-CM

## 2023-12-05 NOTE — Telephone Encounter (Signed)
We are waiting biopsy results from procedures last week Please order him an ultrasound to be done of the abdomen; order ASAP within a week if possible; rule out gallbladder pathology

## 2023-12-06 LAB — SURGICAL PATHOLOGY

## 2023-12-07 ENCOUNTER — Telehealth: Payer: Self-pay

## 2023-12-07 MED ORDER — RIFAXIMIN 550 MG PO TABS: 550.0000 mg | ORAL_TABLET | Freq: Three times a day (TID) | ORAL | 0 refills | Status: AC

## 2023-12-07 NOTE — Telephone Encounter (Signed)
Received samples of xifaxan from the rep. Will put samples at front desk for patient to pick up. Patient verbalized understanding.

## 2023-12-07 NOTE — Telephone Encounter (Signed)
Derrick [name], please let [name] know his gastric and small bowel biopsies were normal One of the 2 polyps removed from the colon was benign but precancerous No evidence of cancer I would like to treat him for SIBO with rifaximin 550 mg 3 times daily x 2 weeks May need to get drug samples if not covered and once they are available he can come pick them up   Derrick [name], no pathology letter needed or recall based on age

## 2023-12-08 ENCOUNTER — Other Ambulatory Visit: Payer: Self-pay

## 2023-12-08 ENCOUNTER — Encounter (HOSPITAL_BASED_OUTPATIENT_CLINIC_OR_DEPARTMENT_OTHER): Payer: Self-pay

## 2023-12-08 ENCOUNTER — Ambulatory Visit (HOSPITAL_COMMUNITY)
Admission: RE | Admit: 2023-12-08 | Discharge: 2023-12-08 | Disposition: A | Discharge status: Home / Self Care | Payer: Medicare Other | Source: Ambulatory Visit | Attending: Internal Medicine | Admitting: Internal Medicine

## 2023-12-08 ENCOUNTER — Inpatient Hospital Stay (HOSPITAL_BASED_OUTPATIENT_CLINIC_OR_DEPARTMENT_OTHER)
Admission: EM | Admit: 2023-12-08 | Discharge: 2023-12-11 | DRG: 418 | Disposition: A | Discharge status: Home / Self Care | Payer: Medicare Other | Attending: Internal Medicine | Admitting: Internal Medicine

## 2023-12-08 DIAGNOSIS — Z951 Presence of aortocoronary bypass graft: Secondary | ICD-10-CM | POA: Diagnosis not present

## 2023-12-08 DIAGNOSIS — Z955 Presence of coronary angioplasty implant and graft: Secondary | ICD-10-CM

## 2023-12-08 DIAGNOSIS — E785 Hyperlipidemia, unspecified: Secondary | ICD-10-CM | POA: Diagnosis present

## 2023-12-08 DIAGNOSIS — I5030 Unspecified diastolic (congestive) heart failure: Secondary | ICD-10-CM | POA: Diagnosis not present

## 2023-12-08 DIAGNOSIS — Z8249 Family history of ischemic heart disease and other diseases of the circulatory system: Secondary | ICD-10-CM

## 2023-12-08 DIAGNOSIS — N289 Disorder of kidney and ureter, unspecified: Secondary | ICD-10-CM | POA: Diagnosis not present

## 2023-12-08 DIAGNOSIS — K8010 Calculus of gallbladder with chronic cholecystitis without obstruction: Principal | ICD-10-CM | POA: Diagnosis present

## 2023-12-08 DIAGNOSIS — I5032 Chronic diastolic (congestive) heart failure: Secondary | ICD-10-CM | POA: Diagnosis present

## 2023-12-08 DIAGNOSIS — E871 Hypo-osmolality and hyponatremia: Secondary | ICD-10-CM | POA: Diagnosis present

## 2023-12-08 DIAGNOSIS — M109 Gout, unspecified: Secondary | ICD-10-CM | POA: Diagnosis present

## 2023-12-08 DIAGNOSIS — D696 Thrombocytopenia, unspecified: Secondary | ICD-10-CM | POA: Diagnosis present

## 2023-12-08 DIAGNOSIS — Z7901 Long term (current) use of anticoagulants: Secondary | ICD-10-CM

## 2023-12-08 DIAGNOSIS — Z950 Presence of cardiac pacemaker: Secondary | ICD-10-CM

## 2023-12-08 DIAGNOSIS — D649 Anemia, unspecified: Secondary | ICD-10-CM | POA: Diagnosis present

## 2023-12-08 DIAGNOSIS — I110 Hypertensive heart disease with heart failure: Secondary | ICD-10-CM | POA: Diagnosis present

## 2023-12-08 DIAGNOSIS — R109 Unspecified abdominal pain: Secondary | ICD-10-CM

## 2023-12-08 DIAGNOSIS — Z9049 Acquired absence of other specified parts of digestive tract: Secondary | ICD-10-CM

## 2023-12-08 DIAGNOSIS — Z79899 Other long term (current) drug therapy: Secondary | ICD-10-CM

## 2023-12-08 DIAGNOSIS — I4821 Permanent atrial fibrillation: Secondary | ICD-10-CM | POA: Diagnosis present

## 2023-12-08 DIAGNOSIS — D860 Sarcoidosis of lung: Secondary | ICD-10-CM | POA: Diagnosis present

## 2023-12-08 DIAGNOSIS — Z85038 Personal history of other malignant neoplasm of large intestine: Secondary | ICD-10-CM

## 2023-12-08 DIAGNOSIS — R1011 Right upper quadrant pain: Principal | ICD-10-CM

## 2023-12-08 DIAGNOSIS — Z886 Allergy status to analgesic agent status: Secondary | ICD-10-CM

## 2023-12-08 DIAGNOSIS — Z96651 Presence of right artificial knee joint: Secondary | ICD-10-CM | POA: Diagnosis present

## 2023-12-08 DIAGNOSIS — I252 Old myocardial infarction: Secondary | ICD-10-CM

## 2023-12-08 DIAGNOSIS — D472 Monoclonal gammopathy: Secondary | ICD-10-CM | POA: Diagnosis present

## 2023-12-08 DIAGNOSIS — Z800 Family history of malignant neoplasm of digestive organs: Secondary | ICD-10-CM

## 2023-12-08 DIAGNOSIS — N179 Acute kidney failure, unspecified: Secondary | ICD-10-CM | POA: Diagnosis not present

## 2023-12-08 DIAGNOSIS — Z87442 Personal history of urinary calculi: Secondary | ICD-10-CM

## 2023-12-08 DIAGNOSIS — I080 Rheumatic disorders of both mitral and aortic valves: Secondary | ICD-10-CM | POA: Diagnosis present

## 2023-12-08 DIAGNOSIS — K219 Gastro-esophageal reflux disease without esophagitis: Secondary | ICD-10-CM | POA: Diagnosis present

## 2023-12-08 DIAGNOSIS — K811 Chronic cholecystitis: Secondary | ICD-10-CM | POA: Diagnosis not present

## 2023-12-08 DIAGNOSIS — Z8261 Family history of arthritis: Secondary | ICD-10-CM

## 2023-12-08 DIAGNOSIS — K824 Cholesterolosis of gallbladder: Secondary | ICD-10-CM | POA: Diagnosis not present

## 2023-12-08 DIAGNOSIS — K8020 Calculus of gallbladder without cholecystitis without obstruction: Secondary | ICD-10-CM | POA: Diagnosis present

## 2023-12-08 DIAGNOSIS — Z7951 Long term (current) use of inhaled steroids: Secondary | ICD-10-CM

## 2023-12-08 DIAGNOSIS — E559 Vitamin D deficiency, unspecified: Secondary | ICD-10-CM | POA: Diagnosis present

## 2023-12-08 DIAGNOSIS — N400 Enlarged prostate without lower urinary tract symptoms: Secondary | ICD-10-CM | POA: Diagnosis present

## 2023-12-08 DIAGNOSIS — Z860100 Personal history of colon polyps, unspecified: Secondary | ICD-10-CM

## 2023-12-08 DIAGNOSIS — K828 Other specified diseases of gallbladder: Secondary | ICD-10-CM | POA: Diagnosis not present

## 2023-12-08 DIAGNOSIS — H4010X0 Unspecified open-angle glaucoma, stage unspecified: Secondary | ICD-10-CM | POA: Diagnosis present

## 2023-12-08 DIAGNOSIS — Z8043 Family history of malignant neoplasm of testis: Secondary | ICD-10-CM

## 2023-12-08 DIAGNOSIS — R7401 Elevation of levels of liver transaminase levels: Secondary | ICD-10-CM

## 2023-12-08 DIAGNOSIS — K838 Other specified diseases of biliary tract: Secondary | ICD-10-CM | POA: Diagnosis not present

## 2023-12-08 DIAGNOSIS — I2510 Atherosclerotic heart disease of native coronary artery without angina pectoris: Secondary | ICD-10-CM | POA: Diagnosis present

## 2023-12-08 DIAGNOSIS — N281 Cyst of kidney, acquired: Secondary | ICD-10-CM | POA: Diagnosis not present

## 2023-12-08 DIAGNOSIS — J4489 Other specified chronic obstructive pulmonary disease: Secondary | ICD-10-CM | POA: Diagnosis present

## 2023-12-08 DIAGNOSIS — G4733 Obstructive sleep apnea (adult) (pediatric): Secondary | ICD-10-CM | POA: Diagnosis present

## 2023-12-08 LAB — LIPASE, BLOOD: Lipase: 32 U/L (ref 11–51)

## 2023-12-08 LAB — CBC WITH DIFFERENTIAL/PLATELET
Abs Immature Granulocytes: 0.02 10*3/uL (ref 0.00–0.07)
Basophils Absolute: 0 10*3/uL (ref 0.0–0.1)
Basophils Relative: 0 %
Eosinophils Absolute: 0.2 10*3/uL (ref 0.0–0.5)
Eosinophils Relative: 4 %
HCT: 38.3 % — ABNORMAL LOW (ref 39.0–52.0)
Hemoglobin: 12.7 g/dL — ABNORMAL LOW (ref 13.0–17.0)
Immature Granulocytes: 0 %
Lymphocytes Relative: 8 %
Lymphs Abs: 0.6 10*3/uL — ABNORMAL LOW (ref 0.7–4.0)
MCH: 31.2 pg (ref 26.0–34.0)
MCHC: 33.2 g/dL (ref 30.0–36.0)
MCV: 94.1 fL (ref 80.0–100.0)
Monocytes Absolute: 0.4 10*3/uL (ref 0.1–1.0)
Monocytes Relative: 6 %
Neutro Abs: 5.5 10*3/uL (ref 1.7–7.7)
Neutrophils Relative %: 82 %
Platelets: 141 10*3/uL — ABNORMAL LOW (ref 150–400)
RBC: 4.07 MIL/uL — ABNORMAL LOW (ref 4.22–5.81)
RDW: 13.2 % (ref 11.5–15.5)
WBC: 6.7 10*3/uL (ref 4.0–10.5)
nRBC: 0 % (ref 0.0–0.2)

## 2023-12-08 LAB — COMPREHENSIVE METABOLIC PANEL
ALT: 88 U/L — ABNORMAL HIGH (ref 0–44)
AST: 219 U/L — ABNORMAL HIGH (ref 15–41)
Albumin: 4.3 g/dL (ref 3.5–5.0)
Alkaline Phosphatase: 112 U/L (ref 38–126)
Anion gap: 7 (ref 5–15)
BUN: 21 mg/dL (ref 8–23)
CO2: 28 mmol/L (ref 22–32)
Calcium: 10 mg/dL (ref 8.9–10.3)
Chloride: 105 mmol/L (ref 98–111)
Creatinine, Ser: 1.14 mg/dL (ref 0.61–1.24)
GFR, Estimated: >60 mL/min (ref >60)
Glucose, Bld: 103 mg/dL — ABNORMAL HIGH (ref 70–99)
Potassium: 4 mmol/L (ref 3.5–5.1)
Sodium: 140 mmol/L (ref 135–145)
Total Bilirubin: 1.5 mg/dL — ABNORMAL HIGH (ref <1.2)
Total Protein: 7.1 g/dL (ref 6.5–8.1)

## 2023-12-08 MED ORDER — DORZOLAMIDE HCL-TIMOLOL MAL 2-0.5 % OP SOLN
1.0000 [drp] | Freq: Two times a day (BID) | OPHTHALMIC | Status: DC
  Administered 2023-12-08 – 2023-12-11 (×6): 1 [drp] via OPHTHALMIC
  Filled 2023-12-08: qty 10

## 2023-12-08 MED ORDER — SENNOSIDES-DOCUSATE SODIUM 8.6-50 MG PO TABS: 1.0000 | ORAL_TABLET | Freq: Every evening | ORAL | Status: DC | PRN

## 2023-12-08 MED ORDER — ADULT MULTIVITAMIN W/MINERALS CH
1.0000 | ORAL_TABLET | Freq: Every day | ORAL | Status: DC
  Administered 2023-12-09 – 2023-12-11 (×2): 1 via ORAL
  Filled 2023-12-08 (×2): qty 1

## 2023-12-08 MED ORDER — ENOXAPARIN SODIUM 40 MG/0.4ML IJ SOSY
40.0000 mg | PREFILLED_SYRINGE | INTRAMUSCULAR | Status: DC
  Filled 2023-12-08: qty 0.4

## 2023-12-08 MED ORDER — ONDANSETRON HCL 4 MG PO TABS: 4.0000 mg | ORAL_TABLET | Freq: Four times a day (QID) | ORAL | Status: DC | PRN

## 2023-12-08 MED ORDER — HYDROMORPHONE HCL 1 MG/ML IJ SOLN
0.5000 mg | Freq: Four times a day (QID) | INTRAMUSCULAR | Status: DC | PRN
  Administered 2023-12-10 (×2): 0.5 mg via INTRAVENOUS
  Filled 2023-12-08 (×2): qty 0.5

## 2023-12-08 MED ORDER — POLYVINYL ALCOHOL 1.4 % OP SOLN: Freq: Every day | OPHTHALMIC | Status: DC | PRN

## 2023-12-08 MED ORDER — ALBUTEROL SULFATE (2.5 MG/3ML) 0.083% IN NEBU: 3.0000 mL | INHALATION_SOLUTION | Freq: Four times a day (QID) | RESPIRATORY_TRACT | Status: DC | PRN

## 2023-12-08 MED ORDER — SACUBITRIL-VALSARTAN 24-26 MG PO TABS
1.0000 | ORAL_TABLET | Freq: Two times a day (BID) | ORAL | Status: DC
  Administered 2023-12-08 – 2023-12-11 (×5): 1 via ORAL
  Filled 2023-12-08 (×7): qty 1

## 2023-12-08 MED ORDER — TADALAFIL 5 MG PO TABS
5.0000 mg | ORAL_TABLET | Freq: Every day | ORAL | Status: DC
  Administered 2023-12-08 – 2023-12-10 (×3): 5 mg via ORAL
  Filled 2023-12-08 (×4): qty 1

## 2023-12-08 MED ORDER — ROSUVASTATIN CALCIUM 10 MG PO TABS
20.0000 mg | ORAL_TABLET | Freq: Every day | ORAL | Status: DC
  Administered 2023-12-08 – 2023-12-10 (×3): 20 mg via ORAL
  Filled 2023-12-08 (×3): qty 2

## 2023-12-08 MED ORDER — LATANOPROST 0.005 % OP SOLN
1.0000 [drp] | Freq: Every day | OPHTHALMIC | Status: DC
  Administered 2023-12-08 – 2023-12-10 (×3): 1 [drp] via OPHTHALMIC
  Filled 2023-12-08: qty 2.5

## 2023-12-08 MED ORDER — VITAMIN D3 25 MCG (1000 UNIT) PO TABS
1000.0000 [IU] | ORAL_TABLET | Freq: Every day | ORAL | Status: DC
  Administered 2023-12-09 – 2023-12-11 (×2): 1000 [IU] via ORAL
  Filled 2023-12-08 (×2): qty 1

## 2023-12-08 MED ORDER — FLUTICASONE PROPIONATE 50 MCG/ACT NA SUSP: 2.0000 | Freq: Two times a day (BID) | NASAL | Status: DC | PRN

## 2023-12-08 MED ORDER — ONDANSETRON HCL 4 MG/2ML IJ SOLN: 4.0000 mg | Freq: Four times a day (QID) | INTRAMUSCULAR | Status: DC | PRN

## 2023-12-08 MED ORDER — PANTOPRAZOLE SODIUM 40 MG PO TBEC
40.0000 mg | DELAYED_RELEASE_TABLET | Freq: Every day | ORAL | Status: DC
  Administered 2023-12-09 – 2023-12-11 (×2): 40 mg via ORAL
  Filled 2023-12-08 (×2): qty 1

## 2023-12-08 MED ORDER — HYDROMORPHONE HCL 1 MG/ML IJ SOLN
0.5000 mg | Freq: Once | INTRAMUSCULAR | Status: AC
  Administered 2023-12-08: 0.5 mg via INTRAVENOUS
  Filled 2023-12-08: qty 1

## 2023-12-08 MED ORDER — ACETAMINOPHEN 325 MG PO TABS
650.0000 mg | ORAL_TABLET | Freq: Four times a day (QID) | ORAL | Status: DC | PRN
  Administered 2023-12-08 – 2023-12-09 (×3): 650 mg via ORAL
  Filled 2023-12-08 (×3): qty 2

## 2023-12-08 MED ORDER — ACETAMINOPHEN 650 MG RE SUPP
650.0000 mg | Freq: Four times a day (QID) | RECTAL | Status: DC | PRN
Start: 2023-12-08 — End: 2023-12-11

## 2023-12-08 MED ORDER — FINASTERIDE 5 MG PO TABS
5.0000 mg | ORAL_TABLET | Freq: Every day | ORAL | Status: DC
  Administered 2023-12-09 – 2023-12-11 (×2): 5 mg via ORAL
  Filled 2023-12-08 (×2): qty 1

## 2023-12-08 MED ORDER — TRIAMTERENE-HCTZ 37.5-25 MG PO TABS
1.0000 | ORAL_TABLET | Freq: Every day | ORAL | Status: DC
  Administered 2023-12-09: 1 via ORAL
  Filled 2023-12-08: qty 1

## 2023-12-08 NOTE — ED Provider Notes (Signed)
[place] EMERGENCY DEPARTMENT AT [facility] Provider Note   CSN: [id number] Arrival date & time: 12/08/23  [id number]     History  No chief complaint on file.   Derrick [name] is a 82 y.o. male.  HPI Patient presents abdominal pain.  Over the last week has had about 4 episodes of severe epigastric right upper quadrant pain and goes to the back.  Comes on after eating.  Had some mild nausea.  Patient's gastroenterologist had ordered outpatient ultrasound that was done.  However had severe pain that is now proved within the last half hour and came into the ER after having the ultrasound done.  No fevers.  Is on Eliquis for A-fib and did take it this morning.   Past Medical History:  Diagnosis Date   Anemia    iron deficiency anemia- infusion on 02/15/2020 and 02/22/2020   Atrial fibrillation ([name]) 09/2023   BENIGN PROSTATIC HYPERTROPHY, WITH OBSTRUCTION 05/28/2010   CAD (coronary artery disease)    a. s/p NSTEMI 06/01/11: DES to RCA;  b. cath 06/25/11:   dLM 50-60% (FFR 0.87), prox to mid LAD 40-50%, D1 50%, pCFX 50%, RCA stent ok, dPDA 80%, EF 55-60%.  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   DISH (diffuse idiopathic skeletal hyperostosis) 12/02/2015   Diverticulitis    GENERALIZED OSTEOARTHROSIS UNSPECIFIED SITE 05/28/2010   GERD 05/28/2010   Heart murmur    History of gout    History of kidney stones     HYPERLIPIDEMIA 05/28/2010   Hypertension    IgM lambda paraproteinemia    Intrinsic asthma, 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   "associated w/lung sarcoidosis"   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   MITRAL VALVE PROLAPSE 05/28/2010   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   NSTEMI (non-ST elevated myocardial infarction) ([name]) 06/03/2011   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 Authorizing Provider  albuterol (VENTOLIN HFA) 108 (90 Base) MCG/ACT inhaler INHALE 2 PUFFS BY MOUTH EVERY 6 HOURS AS NEEDED FOR WHEEZING FOR SHORTNESS OF BREATH 09/26/23   Bedsole, Amy E, MD  apixaban (ELIQUIS) 5 MG TABS tablet Take 1 tablet (5 mg total) by mouth 2 (two) times daily. 04/21/23   Gaston Islam., NP  bimatoprost (LUMIGAN) 0.01 % SOLN Place 1 drop into both eyes  at bedtime.    [provider]  Cholecalciferol 25 MCG (1000 UT) tablet Take 1,000 Units by mouth daily.     [provider]  diclofenac Sodium (VOLTAREN) 1 % GEL Apply 2 g topically 4 (four) times daily as needed (pain).    [provider]  dorzolamide-timolol (COSOPT) 22.3-6.8 MG/ML ophthalmic solution Place 1 drop into both eyes 2 (two) times daily.  09/30/15   [provider]  finasteride (PROSCAR) 5 MG tablet Take 5 mg by mouth daily after breakfast.  10/13/12   [provider]  fluticasone (FLONASE) 50 MCG/ACT nasal spray Place 2 sprays  into both nostrils 2 (two) times daily.    [provider]  Fluticasone Furoate (ARNUITY ELLIPTA) 100 MCG/ACT AEPB Inhale 1 puff into the lungs 2 (two) times daily. 11/22/23   Excell Seltzer, MD  Multiple Vitamin (MULTIVITAMIN WITH MINERALS) TABS tablet Take 1 tablet by mouth daily.    [provider]  pantoprazole (PROTONIX) 40 MG tablet Take 1 tablet (40 mg total) by mouth 2 (two) times daily. 09/27/23   Unk Lightning, PA  pantoprazole (PROTONIX) 40 MG tablet Take 1 tablet (40 mg total) by mouth daily. 12/01/23   Pyrtle, Carie Caddy, MD  Polyethyl Glycol-Propyl Glycol (SYSTANE OP) Place 1 drop into both eyes daily as needed (dry eyes).    [provider]  rifaximin (XIFAXAN) 550 MG TABS tablet Take 1 tablet (550 mg total) by mouth 3 (three) times daily for 14 days. 12/07/23 12/21/23  Pyrtle, Carie Caddy, MD  rosuvastatin (CRESTOR) 20 MG tablet Take 1 tablet by mouth once daily 04/11/23   Tonny Bollman, MD  sacubitril-valsartan (ENTRESTO) 24-26 MG Take 1 tablet by mouth 2 (two) times daily. 03/21/23   Tonny Bollman, MD  tadalafil (CIALIS) 5 MG tablet Take 5 mg by mouth at bedtime.     [provider]  triamcinolone cream (KENALOG) 0.1 % Apply 1 Application topically 2 (two) times daily. 11/30/23   [provider]  triamterene-hydrochlorothiazide Joseph Pierini) 37.5-25 MG tablet Take 1 tablet by mouth once daily 07/12/23   Tonny Bollman, MD      Allergies    Nsaids    Review of Systems   Review of Systems  Physical Exam Updated Vital Signs BP (!) 119/59   Pulse 60   Temp 97.7 F (36.5 C)   Resp 18   SpO2 100%  Physical Exam Vitals and nursing note reviewed.  HENT:     Head: Atraumatic.  Cardiovascular:     Rate and Rhythm: Normal rate.  Chest:     Chest wall: No tenderness.  Abdominal:     Comments: Mild right upper outer tenderness without rebound or guarding.  No hernia palpated.  Musculoskeletal:     Cervical back: Neck supple.   Neurological:     Mental Status: He is alert.     ED Results / Procedures / Treatments   Labs (all labs ordered are listed, but only abnormal results are displayed) Labs Reviewed  COMPREHENSIVE METABOLIC PANEL - Abnormal; Notable for the following components:      Result Value   Glucose, Bld 103 (*)    AST 219 (*)    ALT 88 (*)    Total Bilirubin 1.5 (*)    All other components within normal limits  CBC WITH DIFFERENTIAL/PLATELET - Abnormal; Notable for the following components:   RBC 4.07 (*)    Hemoglobin 12.7 (*)    HCT 38.3 (*)  Platelets 141 (*)    Lymphs Abs 0.6 (*)    All other components within normal limits  LIPASE, BLOOD    EKG None  Radiology US Abdomen Complete Result Date: 12/08/2023 CLINICAL DATA:  Abdominal pain for 2 weeks EXAM: ABDOMEN ULTRASOUND COMPLETE COMPARISON:  CT chest, abdomen and pelvis from 05/24/2023 FINDINGS: Gallbladder: Small amount of layering sludge and suspected tiny nonshadowing gallstones (several small calcified gallstones were present within the gallbladder on 05/24/2023 CT abdomen study). No definite gallbladder wall thickening. No pericholecystic fluid. Sonographic Eulah Pont sign is present per the technologist. A 7 mm gallbladder polyp is identified. Common bile duct: Not visualized. No appreciable intrahepatic biliary ductal dilatation. Liver: No focal lesion identified. Within normal limits in parenchymal echogenicity. Portal vein is patent on color Doppler imaging with normal direction of blood flow towards the liver. IVC: No abnormality visualized. Pancreas: Visualized portion unremarkable. Spleen: Size and appearance within normal limits. Right Kidney: Length: 11.0 cm. Echogenicity within normal limits. No mass or hydronephrosis visualized. Left Kidney: Length: 10.3 cm. Echogenicity within normal limits. No hydronephrosis. Exophytic simple 1.9 x 1.7 x 1.9 cm lower left renal cyst. Abdominal aorta: No aneurysm visualized. Other findings:  None. IMPRESSION: 1. Small amount of layering sludge and suspected tiny nonshadowing gallstones (several small calcified gallstones were present within the gallbladder on 05/24/2023 CT abdomen study). Sonographic Eulah Pont sign is present per the technologist, but there is no definite gallbladder wall thickening or pericholecystic fluid. Consider further evaluation with nuclear medicine hepatobiliary scan as clinically warranted. 2. A 7 mm gallbladder polyp. Recommend follow-up right upper quadrant ultrasound in 6 months. 3. Common bile duct not visualized. No appreciable intrahepatic biliary ductal dilatation. 4. Simple 1.9 cm lower left renal cyst, for which no follow-up imaging is recommended. Electronically Signed   By: Delbert Phenix M.D.   On: 12/08/2023 10:46    Procedures Procedures    Medications Ordered in ED Medications - No data to display  ED Course/ Medical Decision Making/ A&P                                 Medical Decision Making Amount and/or Complexity of Data Reviewed Labs: ordered.   Patient with right upper quadrant pain.  Differential diagnosis includes cause such as ulcers, nonspecific abdominal pain, biliary colic.  Ultrasound has been done but not read yet.  Will get basic blood work.  Pain improved now. Patient is on anticoagulation and did take it this morning.  LFTs are somewhat elevated.  Patient states she will drink an occasional glass of wine.  Ultrasound done and that showed sludge but without gallbladder wall thickening or pericholecystic fluid.  Does reportedly have positive Murphy sign.  HIDA scan had been recommended.  States really cannot eat due to the pain.  I think with lab abnormality and ultrasound abnormality will benefit from admission to the hospital.  Likely either HIDA scan or general surgery consultation.  Of note ultrasound was unable to visualize the common bile duct.  Will discuss with hospitalist for admission.          Final  Clinical Impression(s) / ED Diagnoses Final diagnoses:  RUQ abdominal pain    Rx / DC Orders ED Discharge Orders     None         Benjiman Core, MD 12/08/23 1141

## 2023-12-08 NOTE — ED Notes (Signed)
 Called [facility] for transport, pt bed assignment is ready

## 2023-12-08 NOTE — ED Notes (Signed)
Report given to Adam with [facility]. ETA 15 mins.

## 2023-12-08 NOTE — ED Triage Notes (Signed)
Pt c/o "what I believe to be multiple gallbladder attacks, starting at 0330, 4th one in about 7-8 days." Advises [place] was done at [facility] at 0900 per GI order, states he came here immediately following. C/o pressure on diaphragm on R side, "excruciating, radiating into my back." States he has had "slight" nausea, no vomiting, no diarrhea.

## 2023-12-08 NOTE — ED Notes (Signed)
[facility] at bedside 

## 2023-12-08 NOTE — ED Notes (Signed)
Attempted to call receiving RN to give report. Unavailable at this time. Call back number given.

## 2023-12-08 NOTE — ED Notes (Signed)
UA obtained. Sent to lab to hold.

## 2023-12-08 NOTE — Plan of Care (Signed)

## 2023-12-08 NOTE — Progress Notes (Signed)
   12/08/23 2223  BiPAP/CPAP/SIPAP  BiPAP/CPAP/SIPAP Pt Type Adult  BiPAP/CPAP/SIPAP Resmed (from home)  Mask Type Nasal pillows  FiO2 (%) 21 %  Heater Temperature  (sterile water provided)  Patient Home Equipment Yes  Auto Titrate Yes (5-15)  Safety Check Completed by RT for Home Unit Yes, no issues noted  BiPAP/CPAP /SiPAP Vitals  Pulse Rate 61  Resp 17  SpO2 96 %  MEWS Score/Color  MEWS Score 1  MEWS Score Color Green

## 2023-12-08 NOTE — H&P (Signed)
History and Physical    Patient: Derrick [name] YQM:[id number] DOB: 1941/01/26 DOA: 12/08/2023 DOS: the patient was seen and examined on 12/08/2023 PCP: Excell Seltzer, MD  Patient coming from: [facility]  Chief Complaint: RUQ pain, epigastric pain  HPI: Derrick [name] is a 82 y.o. male with medical history significant of CAD s/p CABG, NSTEMI, CHF, mitral valve prolapse,pulmonary sarcoidosis, BPH, GERD, HLD, OSA on CPAP and A-fib on Eliquis who presented to the ED for evaluation of epigastric and RUQ pain.  Patient states he has had abdominal pain on and off for about a week.  He describes the pain as sharp in his RUQ and epigastrium that sometimes radiates to his right back.  He has had this colicky pain 4 times in the last week and it comes on his time while he is sleeping at night.  Each episode has been progressively worse and lasting longer than the 1 before.  He discussed this with his GI doctor who ordered an abdominal ultrasound to further evaluate.  Last night, he had a late dinner of ham and white bread around 7 PM.  He woke up around 3 AM with significant pain so he presented to his ultrasound appointment before going to the ED at [facility].  He reports associated poor appetite, nausea and vomiting but denies any fevers, chills, chest pain, shortness of breath, dysuria, diarrhea or constipation.  ED course: Initial vitals showed temp 97.7, RR 16, HR 101, BP 139/80, SpO2 99% on room air. Labs showed sodium 140, K+ 4.0, creatinine 1.14, AST/ALT 219/88, alk phos 112, bilirubin 1.5, WBC 6.7, Hgb 12.7, platelet 141, lipase 32 Abd U/S shows small gallstones and positive sonographic Murphy sign but no gallbladder wall thickening or pericholecystic fluid as well as send 7 mm gallbladder polyp. Common bile duct not visualized but no appreciable intrahepatic biliary ductal dilatation. Patient received IV Dilaudid for pain He was admitted to [facility] service and transferred to [facility]    Review of  Systems: As mentioned in the history of present illness. All other systems reviewed and are negative. Past Medical History:  Diagnosis Date   Anemia    iron deficiency anemia- infusion on 02/15/2020 and 02/22/2020   Atrial fibrillation ([name]) 09/2023   BENIGN PROSTATIC HYPERTROPHY, WITH OBSTRUCTION 05/28/2010   CAD (coronary artery disease)    a. s/p NSTEMI 06/01/11: DES to RCA;  b. cath 06/25/11:   dLM 50-60% (FFR 0.87), prox to mid LAD 40-50%, D1 50%, pCFX 50%, RCA stent ok, dPDA 80%, EF 55-60%.  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   DISH (diffuse idiopathic skeletal hyperostosis) 12/02/2015   Diverticulitis    GENERALIZED OSTEOARTHROSIS UNSPECIFIED SITE 05/28/2010   GERD 05/28/2010   Heart murmur    History of gout    History of kidney stones    HYPERLIPIDEMIA 05/28/2010   Hypertension    IgM lambda  paraproteinemia    Intrinsic asthma, 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   "associated w/lung sarcoidosis"   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   MITRAL VALVE PROLAPSE 05/28/2010   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   NSTEMI (non-ST elevated myocardial infarction) ([name]) 06/03/2011   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   OSA on CPAP 05/28/2010   PALPITATIONS, CHRONIC 05/28/2010   Parotid gland pain 2010   infection   Peripheral neuropathy    "tx'd w/targeted chemo" (05/12/2016)    Pneumonia 2-3 times   Presence of permanent cardiac pacemaker    [employer]   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   Sarcoidosis    pulmonalis   Sleep apnea    UNSPECIFIED INFLAMMATORY AND TOXIC NEUROPATHY 05/28/2010   Vision loss    VITAMIN D DEFICIENCY 05/28/2010   Past Surgical History:  Procedure Laterality Date   APPENDECTOMY  1954   age 52   BIV UPGRADE N/A 09/23/2022   Procedure: BIV PACEMAKER UPGRADE;  Surgeon: Regan Lemming, MD;  Location: [facility] INVASIVE CV LAB;  Service: Cardiovascular;  Laterality: N/A;   CATARACT EXTRACTION W/ INTRAOCULAR LENS IMPLANT Left    COLON SURGERY  2000   decending colon    COLONOSCOPY     CORONARY ANGIOPLASTY WITH STENT PLACEMENT  06/03/2011   CORONARY ARTERY BYPASS GRAFT N/A 06/05/2019   Procedure: CORONARY ARTERY BYPASS GRAFTING (CABG) TIMES THREE USING LEFT INTERNAL MAMMARY ARTERY AND RIGHT GREATER SEPHANOUS VEIN;  Surgeon: Kerin Perna, MD;  Location: [facility] OR;  Service: Open Heart Surgery;  Laterality: N/A;   CORONARY PRESSURE/FFR STUDY N/A 05/15/2019   Procedure: INTRAVASCULAR PRESSURE WIRE/FFR STUDY;  Surgeon: Tonny Bollman, MD;  Location: [facility]VASIVE CV LAB;  Service: Cardiovascular;  Laterality: N/A;   ELECTROPHYSIOLOGIC STUDY N/A 05/12/2016   Procedure: Cardioversion;  Surgeon: Will Jorja Loa, MD;  Location: [facility] INVASIVE CV LAB;  Service: Cardiovascular;  Laterality: N/A;   EP IMPLANTABLE DEVICE N/A 05/12/2016   Procedure: Pacemaker Implant;  Surgeon: Will Jorja Loa, MD;  Location: [facility] INVASIVE CV LAB;  Service: Cardiovascular;  Laterality: N/A;   EYE SURGERY     bilateral cataract with lens implant   HERNIA REPAIR  01/2019   left inguinal hernia repair   INGUINAL HERNIA REPAIR Left 02/21/2019   Procedure: LEFT INGUINAL HERNIA REPAIR WITH MESH;  Surgeon: Harriette Bouillon, MD;  Location: [facility] OR;  Service: General;  Laterality: Left;   INSERT / REPLACE / REMOVE PACEMAKER  05/12/2016   KNEE ARTHROSCOPY Right 2003   LEFT HEART  CATH AND CORONARY ANGIOGRAPHY N/A 05/15/2019   Procedure: LEFT HEART CATH AND CORONARY ANGIOGRAPHY;  Surgeon: Tonny Bollman, MD;  Location: [facility] INVASIVE CV LAB;  Service: Cardiovascular;  Laterality: N/A;   LEFT HEART CATHETERIZATION WITH CORONARY ANGIOGRAM N/A 03/31/2015   Procedure: LEFT HEART CATHETERIZATION WITH CORONARY ANGIOGRAM;  Surgeon: Tonny Bollman, MD;  Location: [facility] CATH LAB;  Service: Cardiovascular;  Laterality: N/A;   LUNG SURGERY  2001   "open lung dissection"   MOHS SURGERY Left ~ 2008   ear   PROSTATE BIOPSY  2010   TEE WITHOUT CARDIOVERSION N/A 06/05/2019   Procedure: TRANSESOPHAGEAL ECHOCARDIOGRAM (TEE);  Surgeon: Donata Clay, Theron Arista, MD;  Location: [facility] OR;  Service: Open Heart Surgery;  Laterality: N/A;   TONSILLECTOMY  1950   TOTAL KNEE ARTHROPLASTY Right 03/11/2020   Procedure: TOTAL KNEE ARTHROPLASTY;  Surgeon: Durene Romans, MD;  Location: [facility] ORS;  Service: Orthopedics;  Laterality: Right;  70 mins   Social History:  reports that  he has never smoked. He has never used smokeless tobacco. He reports current alcohol use. He reports that he does not use drugs.  Allergies  Allergen Reactions   Nsaids     Contraindicated due to being on Eliquis.    Family History  Problem Relation Age of Onset   Arthritis Mother        severe   Coronary artery disease Mother    Colon polyps Mother    Heart disease Mother    Sleep apnea Mother    Sleep apnea Father    Colon polyps Brother    Coronary artery disease Brother    Cancer Brother        choriocarcinoma   Arrhythmia Brother 46       Afib/Tachycardia   Heart attack Brother    Congestive Heart Failure Brother    Sleep apnea Brother    Colon cancer Cousin    Crohn's disease Neg Hx    Esophageal cancer Neg Hx    Rectal cancer Neg Hx    Stomach cancer Neg Hx     Prior to Admission medications   Medication Sig Start Date End Date Taking? Authorizing Provider  albuterol (VENTOLIN HFA) 108 (90 Base) MCG/ACT inhaler  INHALE 2 PUFFS BY MOUTH EVERY 6 HOURS AS NEEDED FOR WHEEZING FOR SHORTNESS OF BREATH 09/26/23  Yes Bedsole, Amy E, MD  apixaban (ELIQUIS) 5 MG TABS tablet Take 1 tablet (5 mg total) by mouth 2 (two) times daily. 04/21/23  Yes Gaston Islam., NP  bimatoprost (LUMIGAN) 0.01 % SOLN Place 1 drop into both eyes at bedtime.   Yes [provider]  Cholecalciferol 25 MCG (1000 UT) tablet Take 1,000 Units by mouth daily.    Yes [provider]  diclofenac Sodium (VOLTAREN) 1 % GEL Apply 2 g topically 4 (four) times daily as needed (pain).   Yes [provider]  dorzolamide-timolol (COSOPT) 22.3-6.8 MG/ML ophthalmic solution Place 1 drop into both eyes 2 (two) times daily.  09/30/15  Yes [provider]  finasteride (PROSCAR) 5 MG tablet Take 5 mg by mouth daily after breakfast.  10/13/12  Yes [provider]  fluticasone (FLONASE) 50 MCG/ACT nasal spray Place 2 sprays into both nostrils 2 (two) times daily.   Yes [provider]  Fluticasone Furoate (ARNUITY ELLIPTA) 100 MCG/ACT AEPB Inhale 1 puff into the lungs 2 (two) times daily. 11/22/23  Yes Bedsole, Amy E, MD  Multiple Vitamin (MULTIVITAMIN WITH MINERALS) TABS tablet Take 1 tablet by mouth daily.   Yes [provider]  pantoprazole (PROTONIX) 40 MG tablet Take 1 tablet (40 mg total) by mouth 2 (two) times daily. Patient taking differently: Take 40 mg by mouth daily. 09/27/23  Yes Lemmon, Violet Baldy, PA  Polyethyl Glycol-Propyl Glycol (SYSTANE OP) Place 1 drop into both eyes daily as needed (dry eyes).   Yes [provider]  rosuvastatin (CRESTOR) 20 MG tablet Take 1 tablet by mouth once daily 04/11/23  Yes Tonny Bollman, MD  sacubitril-valsartan (ENTRESTO) 24-26 MG Take 1 tablet by mouth 2 (two) times daily. 03/21/23  Yes Tonny Bollman, MD  tadalafil (CIALIS) 5 MG tablet Take 5 mg by mouth at bedtime.    Yes [provider]  triamcinolone cream (KENALOG) 0.1 % Apply 1  Application topically 2 (two) times daily. 11/30/23  Yes [provider]  triamterene-hydrochlorothiazide (MAXZIDE-25) 37.5-25 MG tablet Take 1 tablet by mouth once daily 07/12/23  Yes Tonny Bollman, MD  rifaximin [employer]man [employer]) 550  MG TABS tablet Take 1 tablet (550 mg total) by mouth 3 (three) times daily for 14 days. 12/07/23 12/21/23  Beverley Fiedler, MD    Physical Exam: Vitals:   12/08/23 1330 12/08/23 1400 12/08/23 1407 12/08/23 1446  BP: (!) 140/67 (!) 144/67  (!) 149/71  Pulse: 60 60  64  Resp: 17 18  18   Temp:   100.3 F (37.9 C) (!) 100.5 F (38.1 C)  TempSrc:   Oral Oral  SpO2: 98% 100%  100%   General: Pleasant, well-appearing elderly man laying in bed. No acute distress. HEENT: [place]/AT. Anicteric sclera.  CV: Regular rate. Ventricularly paced rhythm. III/VI systolic murmur. No LE edema Pulmonary: Lungs CTAB. Normal effort. No wheezing or rales. Abdominal: Soft, non-distended. Mild RUQ and epigastric ttp. No rebound or guarding. Neg murphy's sign.   Extremities: Palpable radial and DP pulses. Normal ROM. Skin: Warm and dry. No obvious rash or lesions. Neuro: A&Ox3. Moves all extremities. Normal sensation to light touch. No focal deficit. Psych: Normal mood and affect  Data Reviewed: Labs showed sodium 140, K+ 4.0, creatinine 1.14, AST/ALT 219/88, alk phos 112, bilirubin 1.5, WBC 6.7, Hgb 12.7, platelet 141, lipase 32 Abd U/S shows small gallstones and positive sonographic Murphy sign but no gallbladder wall thickening or pericholecystic fluid as well as send 7 mm gallbladder polyp. Common bile duct not visualized but no appreciable intrahepatic biliary ductal dilatation.   Assessment and Plan: Ayomide Bailiff is a 82 y.o. male with medical history significant of CAD s/p CABG, NSTEMI, CHF, mitral valve prolapse, pulmonary sarcoidosis, BPH, GERD, HLD, OSA on CPAP and A-fib on Eliquis who presented to the ED for evaluation of epigastric and RUQ pain and admitted for  symptomatic cholelithiasis.  # Cholelithiasis # Transaminitis Patient presented with 1 week of intermittent colicky pain that has progressed over the last few days with associated nausea and vomiting found to have cholelithiasis on imaging without signs of acute cholecystitis or biliary dilatation. Elevated LFTs, mildly elevated bilirubin but normal alk phos. Normal white count however patient has been spiking fevers today likely indicating early signs of a developing cholecystitis. General surgery has been consulted and will see in the morning. Patient will need to hold his Eliquis for at least 48 hours, last dose this morning.  -General Surgery consulted, appreciate recs -Clear liquid diet -As needed IV Dilaudid for pain -As needed IV Zofran for nausea and vomiting -Trend CMP, CBC, fever curve  # Persistent A-fib Patient on Eliquis 5 mg twice daily. Last dose was this morning. HR stable in the 60s with ventricularly paced rhythm. -Telemetry -Hold Eliquis -Trend and replete electrolytes  # HFpEF # Aortic stenosis # Mitral valve prolapse Last TTE on 11/14 showed EF 55 to 60%, mildly elevated PASP, severely dilated LA, moderately dilated RA, moderate mitral valve regurg and moderate to severe aortic valve stenosis.  Patient euvolemic on exam. -Continue Entresto  # HTN BP stable with SBP in the 110s to 140s. -Continue Maxzide-25 and Entresto  # CAD # Hx of NSTEMI # HLD -Continue rosuvastatin  # GERD -Continue Protonix  # BPH -Continue finasteride and tadalafil  # OSA on CPAP -CPAP at bedtime    Advance Care Planning:   Code Status: Full Code   Consults: General Surgery  Family Communication: Discussed admission with spouse at bedside  Severity of Illness: The appropriate patient status for this patient is OBSERVATION. Observation status is judged to be reasonable and necessary in order to provide the required  intensity of service to ensure the patient's safety. The  patient's presenting symptoms, physical exam findings, and initial radiographic and laboratory data in the context of their medical condition is felt to place them at decreased risk for further clinical deterioration. Furthermore, it is anticipated that the patient will be medically stable for discharge from the hospital within 2 midnights of admission.   Author: Steffanie Rainwater, MD 12/08/2023 4:07 PM  For on call review [email or web address].

## 2023-12-08 NOTE — Progress Notes (Signed)
Plan of Care Note for accepted transfer   Patient: Derrick [name] MRN: [id number]   DOA: 12/08/2023  Facility requesting transfer:  Requesting Provider: Benjiman Core, MD Reason for transfer: Biliary colic. Facility course:   82 year old male with a past medical history of CAD, NSTEMI, CHF, mitral valve prolapse, GERD, hyperlipidemia, nephrolithiasis, OSA on CPAP who has been having multiple episodes of biliary colic since last week.  He was getting an outpatient ultrasound done ordered by GI when his pain became very intense and he had to go to the emergency department.  Lab work: Research officer, political party [[id number]] (Abnormal)   Collected: 12/08/23 1038   Updated: 12/08/23 1121   Specimen Type: Blood   Specimen Source: Vein    Sodium 140 mmol/L   Potassium 4.0 mmol/L   Chloride 105 mmol/L   CO2 28 mmol/L   Glucose, Bld 103 High  mg/dL   BUN 21 mg/dL   Creatinine, Ser 7.82 mg/dL   Calcium 95.6 mg/dL   Total Protein 7.1 g/dL   Albumin 4.3 g/dL   AST 213 High  U/L   ALT 88 High  U/L   Alkaline Phosphatase 112 U/L   Total Bilirubin 1.5 High  mg/dL   GFR, Estimated >08 mL/min   Anion gap 7  Lipase, blood [[id number]]   Collected: 12/08/23 1038   Updated: 12/08/23 1121   Specimen Type: Blood   Specimen Source: Vein    Lipase 32 U/L  CBC with Differential [[id number]] (Abnormal)   Collected: 12/08/23 1038   Updated: 12/08/23 1053   Specimen Type: Blood   Specimen Source: Vein    WBC 6.7 K/uL   RBC 4.07 Low  MIL/uL   Hemoglobin 12.7 Low  g/dL   HCT 40.1 Low  %   MCV 94.1 fL   MCH 31.2 pg   MCHC 33.2 g/dL   RDW 02.7 %   Platelets 141 Low  K/uL   nRBC 0.0 %   Neutrophils Relative % 82 %   Neutro Abs 5.5 K/uL   Lymphocytes Relative 8 %   Lymphs Abs 0.6 Low  K/uL   Monocytes Relative 6 %   Monocytes Absolute 0.4 K/uL   Eosinophils Relative 4 %   Eosinophils Absolute 0.2 K/uL   Basophils Relative 0 %   Basophils Absolute 0.0 K/uL   Immature Granulocytes 0  %   Abs Immature Granulocytes 0.02 K/uL   Imaging: EXAM: ABDOMEN ULTRASOUND COMPLETE   COMPARISON:  CT chest, abdomen and pelvis from 05/24/2023   FINDINGS: Gallbladder: Small amount of layering sludge and suspected tiny nonshadowing gallstones (several small calcified gallstones were present within the gallbladder on 05/24/2023 CT abdomen study). No definite gallbladder wall thickening. No pericholecystic fluid. Sonographic Eulah Pont sign is present per the technologist. A 7 mm gallbladder polyp is identified.   Common bile duct: Not visualized. No appreciable intrahepatic biliary ductal dilatation.   Liver: No focal lesion identified. Within normal limits in parenchymal echogenicity. Portal vein is patent on color Doppler imaging with normal direction of blood flow towards the liver.   IVC: No abnormality visualized.   Pancreas: Visualized portion unremarkable.   Spleen: Size and appearance within normal limits.   Right Kidney: Length: 11.0 cm. Echogenicity within normal limits. No mass or hydronephrosis visualized.   Left Kidney: Length: 10.3 cm. Echogenicity within normal limits. No hydronephrosis. Exophytic simple 1.9 x 1.7 x 1.9 cm lower left renal cyst.   Abdominal aorta: No aneurysm visualized.   Other findings: None.   IMPRESSION:  1. Small amount of layering sludge and suspected tiny nonshadowing gallstones (several small calcified gallstones were present within the gallbladder on 05/24/2023 CT abdomen study). Sonographic Eulah Pont sign is present per the technologist, but there is no definite gallbladder wall thickening or pericholecystic fluid. Consider further evaluation with nuclear medicine hepatobiliary scan as clinically warranted. 2. A 7 mm gallbladder polyp. Recommend follow-up right upper quadrant ultrasound in 6 months. 3. Common bile duct not visualized. No appreciable intrahepatic biliary ductal dilatation. 4. Simple 1.9 cm lower left renal cyst,  for which no follow-up imaging is recommended.  Plan of care: The patient is accepted for admission to Telemetry unit, at [facility].  Author: Bobette Mo, MD 12/08/2023  Check [email or web address] for on-call coverage.  Nursing staff, Please call [facility] Admits & Consults System-Wide number on Amion as soon as patient's arrival, so appropriate admitting provider can evaluate the pt.

## 2023-12-09 ENCOUNTER — Ambulatory Visit: Payer: Medicare Other | Admitting: Family Medicine

## 2023-12-09 ENCOUNTER — Other Ambulatory Visit (HOSPITAL_COMMUNITY): Payer: Self-pay

## 2023-12-09 ENCOUNTER — Telehealth: Payer: Self-pay | Admitting: Pharmacy Technician

## 2023-12-09 DIAGNOSIS — I110 Hypertensive heart disease with heart failure: Secondary | ICD-10-CM | POA: Diagnosis present

## 2023-12-09 DIAGNOSIS — N400 Enlarged prostate without lower urinary tract symptoms: Secondary | ICD-10-CM | POA: Diagnosis present

## 2023-12-09 DIAGNOSIS — I5030 Unspecified diastolic (congestive) heart failure: Secondary | ICD-10-CM | POA: Diagnosis not present

## 2023-12-09 DIAGNOSIS — N179 Acute kidney failure, unspecified: Secondary | ICD-10-CM | POA: Diagnosis not present

## 2023-12-09 DIAGNOSIS — J4489 Other specified chronic obstructive pulmonary disease: Secondary | ICD-10-CM | POA: Diagnosis present

## 2023-12-09 DIAGNOSIS — I2510 Atherosclerotic heart disease of native coronary artery without angina pectoris: Secondary | ICD-10-CM | POA: Diagnosis present

## 2023-12-09 DIAGNOSIS — Z8249 Family history of ischemic heart disease and other diseases of the circulatory system: Secondary | ICD-10-CM | POA: Diagnosis not present

## 2023-12-09 DIAGNOSIS — K8010 Calculus of gallbladder with chronic cholecystitis without obstruction: Secondary | ICD-10-CM | POA: Diagnosis present

## 2023-12-09 DIAGNOSIS — I252 Old myocardial infarction: Secondary | ICD-10-CM | POA: Diagnosis not present

## 2023-12-09 DIAGNOSIS — R1011 Right upper quadrant pain: Principal | ICD-10-CM

## 2023-12-09 DIAGNOSIS — D472 Monoclonal gammopathy: Secondary | ICD-10-CM | POA: Diagnosis present

## 2023-12-09 DIAGNOSIS — I5032 Chronic diastolic (congestive) heart failure: Secondary | ICD-10-CM | POA: Diagnosis present

## 2023-12-09 DIAGNOSIS — K828 Other specified diseases of gallbladder: Secondary | ICD-10-CM | POA: Diagnosis not present

## 2023-12-09 DIAGNOSIS — E871 Hypo-osmolality and hyponatremia: Secondary | ICD-10-CM | POA: Diagnosis present

## 2023-12-09 DIAGNOSIS — Z96651 Presence of right artificial knee joint: Secondary | ICD-10-CM | POA: Diagnosis present

## 2023-12-09 DIAGNOSIS — N289 Disorder of kidney and ureter, unspecified: Secondary | ICD-10-CM | POA: Diagnosis not present

## 2023-12-09 DIAGNOSIS — R7401 Elevation of levels of liver transaminase levels: Secondary | ICD-10-CM

## 2023-12-09 DIAGNOSIS — Z955 Presence of coronary angioplasty implant and graft: Secondary | ICD-10-CM | POA: Diagnosis not present

## 2023-12-09 DIAGNOSIS — I080 Rheumatic disorders of both mitral and aortic valves: Secondary | ICD-10-CM | POA: Diagnosis present

## 2023-12-09 DIAGNOSIS — I4821 Permanent atrial fibrillation: Secondary | ICD-10-CM | POA: Diagnosis present

## 2023-12-09 DIAGNOSIS — K811 Chronic cholecystitis: Secondary | ICD-10-CM | POA: Diagnosis not present

## 2023-12-09 DIAGNOSIS — E785 Hyperlipidemia, unspecified: Secondary | ICD-10-CM | POA: Diagnosis present

## 2023-12-09 DIAGNOSIS — Z951 Presence of aortocoronary bypass graft: Secondary | ICD-10-CM | POA: Diagnosis not present

## 2023-12-09 DIAGNOSIS — Z79899 Other long term (current) drug therapy: Secondary | ICD-10-CM | POA: Diagnosis not present

## 2023-12-09 DIAGNOSIS — M109 Gout, unspecified: Secondary | ICD-10-CM | POA: Diagnosis present

## 2023-12-09 DIAGNOSIS — D696 Thrombocytopenia, unspecified: Secondary | ICD-10-CM | POA: Diagnosis present

## 2023-12-09 DIAGNOSIS — D649 Anemia, unspecified: Secondary | ICD-10-CM | POA: Diagnosis present

## 2023-12-09 DIAGNOSIS — G4733 Obstructive sleep apnea (adult) (pediatric): Secondary | ICD-10-CM | POA: Diagnosis not present

## 2023-12-09 DIAGNOSIS — K8020 Calculus of gallbladder without cholecystitis without obstruction: Secondary | ICD-10-CM | POA: Diagnosis not present

## 2023-12-09 DIAGNOSIS — Z7901 Long term (current) use of anticoagulants: Secondary | ICD-10-CM | POA: Diagnosis not present

## 2023-12-09 LAB — CBC
HCT: 37.1 % — ABNORMAL LOW (ref 39.0–52.0)
Hemoglobin: 12.2 g/dL — ABNORMAL LOW (ref 13.0–17.0)
MCH: 31.4 pg (ref 26.0–34.0)
MCHC: 32.9 g/dL (ref 30.0–36.0)
MCV: 95.4 fL (ref 80.0–100.0)
Platelets: 129 10*3/uL — ABNORMAL LOW (ref 150–400)
RBC: 3.89 MIL/uL — ABNORMAL LOW (ref 4.22–5.81)
RDW: 13.4 % (ref 11.5–15.5)
WBC: 6.6 10*3/uL (ref 4.0–10.5)
nRBC: 0 % (ref 0.0–0.2)

## 2023-12-09 LAB — COMPREHENSIVE METABOLIC PANEL
ALT: 111 U/L — ABNORMAL HIGH (ref 0–44)
AST: 142 U/L — ABNORMAL HIGH (ref 15–41)
Albumin: 3.9 g/dL (ref 3.5–5.0)
Alkaline Phosphatase: 117 U/L (ref 38–126)
Anion gap: 9 (ref 5–15)
BUN: 21 mg/dL (ref 8–23)
CO2: 24 mmol/L (ref 22–32)
Calcium: 9.5 mg/dL (ref 8.9–10.3)
Chloride: 100 mmol/L (ref 98–111)
Creatinine, Ser: 1.14 mg/dL (ref 0.61–1.24)
GFR, Estimated: >60 mL/min (ref >60)
Glucose, Bld: 95 mg/dL (ref 70–99)
Potassium: 3.7 mmol/L (ref 3.5–5.1)
Sodium: 133 mmol/L — ABNORMAL LOW (ref 135–145)
Total Bilirubin: 2.6 mg/dL — ABNORMAL HIGH (ref <1.2)
Total Protein: 6.8 g/dL (ref 6.5–8.1)

## 2023-12-09 LAB — APTT: aPTT: 75 s — ABNORMAL HIGH (ref 24–36)

## 2023-12-09 LAB — MAGNESIUM: Magnesium: 1.7 mg/dL (ref 1.7–2.4)

## 2023-12-09 LAB — PHOSPHORUS: Phosphorus: 2.6 mg/dL (ref 2.5–4.6)

## 2023-12-09 LAB — LIPASE, BLOOD: Lipase: 26 U/L (ref 11–51)

## 2023-12-09 MED ORDER — HEPARIN (PORCINE) 25000 UT/250ML-% IV SOLN
1250.0000 [IU]/h | INTRAVENOUS | Status: AC
Start: 2023-12-09 — End: 2023-12-10
  Administered 2023-12-09: 1250 [IU]/h via INTRAVENOUS
  Filled 2023-12-09: qty 250

## 2023-12-09 NOTE — Plan of Care (Signed)

## 2023-12-09 NOTE — Progress Notes (Signed)
   12/09/23 1221  TOC Brief Assessment  Insurance and Status Reviewed  Patient has primary care physician Yes  Home environment has been reviewed Home w/ spouse  Prior level of function: Independent  Prior/Current Home Services No current home services  Social Drivers of Health Review SDOH reviewed no interventions necessary  Readmission risk has been reviewed Yes  Transition of care needs transition of care needs identified, TOC will continue to follow

## 2023-12-09 NOTE — Progress Notes (Signed)
PHARMACY - ANTICOAGULATION CONSULT NOTE  Pharmacy Consult for IV heparin Indication: atrial fibrillation  Allergies  Allergen Reactions   Nsaids     Contraindicated due to being on Eliquis.    Patient Measurements:   Heparin Dosing Weight: 83.9 kg   Vital Signs: Temp: 99.2 F (37.3 C) (12/13 0248) Temp Source: Oral (12/13 0248) BP: 120/61 (12/13 0248) Pulse Rate: 60 (12/13 0248)  Labs: Recent Labs    12/08/23 1038 12/09/23 0526  HGB 12.7* 12.2*  HCT 38.3* 37.1*  PLT 141* 129*  CREATININE 1.14 1.14    Estimated Creatinine Clearance: 51.7 mL/min (by C-G formula based on SCr of 1.14 mg/dL).   Medical History: Past Medical History:  Diagnosis Date   Anemia    iron deficiency anemia- infusion on 02/15/2020 and 02/22/2020   Atrial fibrillation ([name]) 09/2023   BENIGN PROSTATIC HYPERTROPHY, WITH OBSTRUCTION 05/28/2010   CAD (coronary artery disease)    a. s/p NSTEMI 06/01/11: DES to RCA;  b. cath 06/25/11:   dLM 50-60% (FFR 0.87), prox to mid LAD 40-50%, D1 50%, pCFX 50%, RCA stent ok, dPDA 80%, EF 55-60%.  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   DISH (diffuse idiopathic skeletal hyperostosis) 12/02/2015   Diverticulitis    GENERALIZED OSTEOARTHROSIS UNSPECIFIED SITE 05/28/2010   GERD 05/28/2010   Heart murmur    History of gout    History of kidney stones    HYPERLIPIDEMIA 05/28/2010   Hypertension    IgM  lambda paraproteinemia    Intrinsic asthma, 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   "associated w/lung sarcoidosis"   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   MITRAL VALVE PROLAPSE 05/28/2010   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   NSTEMI (non-ST elevated myocardial infarction) ([name]) 06/03/2011   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   OSA on CPAP 05/28/2010   PALPITATIONS, CHRONIC 05/28/2010   Parotid gland pain 2010   infection   Peripheral neuropathy    "tx'd w/targeted chemo" (05/12/2016)   Pneumonia 2-3 times   Presence of permanent cardiac pacemaker    [employer]   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 180 tablet 1 12/08/2023 at  7:00 AM   bimatoprost (LUMIGAN) 0.01 % SOLN Place 1 drop into both eyes at bedtime.   12/07/2023 Bedtime   Cholecalciferol 25 MCG (1000 UT) tablet Take 1,000 Units  by mouth daily.    12/08/2023 Morning   diclofenac Sodium (VOLTAREN) 1 % GEL Apply 2 g topically 4 (four) times daily as needed (pain).   Taking As Needed   dorzolamide-timolol (COSOPT) 22.3-6.8 MG/ML ophthalmic solution Place 1 drop into both eyes 2 (two) times daily.    12/08/2023 Morning   finasteride (PROSCAR) 5 MG tablet Take 5 mg by mouth daily after breakfast.    12/08/2023 Morning   fluticasone (FLONASE) 50 MCG/ACT nasal spray Place 2 sprays into both nostrils 2 (two) times daily.   Taking   Fluticasone Furoate (ARNUITY ELLIPTA) 100 MCG/ACT AEPB Inhale 1 puff into the  lungs 2 (two) times daily. 30 each 11 Taking   Multiple Vitamin (MULTIVITAMIN WITH MINERALS) TABS tablet Take 1 tablet by mouth daily.   12/07/2023 Evening   pantoprazole (PROTONIX) 40 MG tablet Take 1 tablet (40 mg total) by mouth 2 (two) times daily. (Patient taking differently: Take 40 mg by mouth daily.) 60 tablet 5 12/08/2023 Morning   Polyethyl Glycol-Propyl Glycol (SYSTANE OP) Place 1 drop into both eyes daily as needed (dry eyes).   12/07/2023 Morning   rosuvastatin (CRESTOR) 20 MG tablet Take 1 tablet by mouth once daily 90 tablet 2 12/07/2023 Bedtime   sacubitril-valsartan (ENTRESTO) 24-26 MG Take 1 tablet by mouth 2 (two) times daily. 180 tablet 3 12/08/2023 Morning   tadalafil (CIALIS) 5 MG tablet Take 5 mg by mouth at bedtime.    12/07/2023 Bedtime   triamcinolone cream (KENALOG) 0.1 % Apply 1 Application topically 2 (two) times daily.   Past Week   triamterene-hydrochlorothiazide (MAXZIDE-25) 37.5-25 MG tablet Take 1 tablet by mouth once daily 90 tablet 3 12/08/2023 Morning   rifaximin (XIFAXAN) 550 MG TABS tablet Take 1 tablet (550 mg total) by mouth 3 (three) times daily for 14 days. 42 tablet 0     Assessment: Pharmacy is consulted to start heparin drip on 82 yo male with PMH of afib. Pt PMH also includes  CAD s/p CABG, NSTEMI, CHF, mitral valve prolapse,pulmonary sarcoidosis, BPH, GERD, HLD, OSA on CPAP. Pt is currently scheduled to undergo OR procedure on 12/14 after a two day washout of apixaban. LD of apixaban was on 12/12 at 0700 per med rec.   Today, 12/09/23  Hgb 12.2, plt 129  Scr 1.14 mg/dl, CrCl 52 ml/min  Messaged Trixie Deis PA with the surgery team, stop heparin drip at 0200 on 12/14    Goal of Therapy:  Heparin level 0.3-0.7 units/ml aPTT 66-102 sec  Monitor platelets by anticoagulation protocol: Yes   Plan:  Start heparin drip at 1250 units/hr  Will avoid bolus due to recent apixaban use Obtain aPTT 8 hours after start of procedure  Daily CBC while on  heparin  Stop heparin drip at 0200 on 12/14 Monitor for signs and symptoms of bleeding    Adalberto Cole, PharmD, BCPS 12/09/2023 11:06 AM

## 2023-12-09 NOTE — Progress Notes (Signed)
PROGRESS NOTE    Derrick [name]  RUE:[id number] DOB: 1941-10-28 DOA: 12/08/2023 PCP: Excell Seltzer, MD   Brief Narrative:  Patient is an 82 year old Caucasian male with a past medical history significant for abdominal to CAD status post CABG and NSTEMI, history of chronic diastolic heart failure with preserved ejection fraction, mitral valve prolapse, pulmonary sarcoidosis, BPH, GERD, hypertension, hyperlipidemia, OSA on CPAP, permanent atrial fibrillation on anticoagulation as well as other comorbidities presented to the ED for evaluation of epigastric right upper quadrant pain.  He has had abdominal pain for about a week that has been intermittent and described the pain as sharp in the right upper quadrant and epigastric that sometimes radiates to his right side of his back.  He had this colicky pain for 10 the last week and it came on while he is sleeping at night.  Each episode of progressively worse and lasted longer than the 1 before.  He discussed this with the GI doctor who ordered an abdominal ultrasound to further evaluate.  The day prior to admission he ended up eating ham and white bread around 7 PM and woke up 3 AM with significant pain so he went to his ultrasound appointment prior to going to the [facility].  Was found to have cholelithiasis and abnormal LFTs and hyperbilirubinemia and was admitted for symptomatic cholelithiasis.  General surgery was consulted and they are planning on taking the patient to the OR in the morning as his Eliquis is being washed out.  He has been placed on a heparin drip now  Assessment and Plan:  Abdominal pain, nausea, vomiting with associated cholelithiasis and hyperbilirubinemia and abnormal LFTs as below -Patient presented with 1 week of intermittent colicky pain that progressed over the last few days associate with nausea and vomiting and was found to have cholelithiasis on imaging without signs of acute cholecystitis or biliary dilatation -LFTs  and bilirubin trend as below -On admission patient was febrile at 100.5 and mildly tachycardic with a pulse rate of 101 -Ultrasound was done and showed cholelithiasis but no pericholecystic fluid or gallbladder wall thickening -Lipase level was normal -Normal WBC count but has been spiking fevers yesterday indicating early signs of developing cholecystitis -WBC trend: Recent Labs  Lab 12/08/23 1038 12/09/23 0526  WBC 6.7 6.6  -General Surgery was consulted and evaluated and his Eliquis and anticoagulation has been held and switched to heparin drip.  Last dose was yesterday morning -Continue clear liquid diet and continue with antiemetics with IV Zofran for nausea or vomiting and IV Dilaudid for pain -General surgery PA is discussing with the general surgeon about if MRCP is needed given that the patient has a permanent pacemaker and he would need to go to comfort that versus just repeating labs in the a.m. with plans for IOC at the time of laparoscopic cholecystectomy. -To monitor trend CBC, CMP and fever curve -He will need to be n.p.o. at midnight  Persistent A-fib -Patient on Eliquis 5 mg twice daily.  And his last dose was yesterday morning HR stable in the 60s with ventricularly paced rhythm. -Continue to monitor on telemetry -Hold Eliquis and transition to heparin drip -Trend and replete electrolytes   Chronic HFpEF Aortic stenosis Mitral valve prolapse -Last TTE on 11/14 showed EF 55 to 60%, mildly elevated PASP, severely dilated LA, moderately dilated RA, moderate mitral valve regurg and moderate to severe aortic valve stenosis.  Patient euvolemic on exam. -Continue Entresto -Hold his Maxzide for now   Essential  HTN -Continue with Entresto and hold Maxzide -Continue monitor blood pressures per protocol -Last blood pressure reading was 129/62  Glaucoma  -Continue with bimatoprost 1 drop in both eyes nightly as well as dorzolamide-timolol 1 drop in both eyes twice  daily  Hyponatremia -Will discontinue his Maxide for now and continue to monitor sodiums -Na+ Trend: Recent Labs  Lab 12/08/23 1038 12/09/23 0526  NA 140 133*  -Repeat CMP in the AM   CAD Hx of NSTEMI HLD -Continue Rosuvastatin 20 mg po qHS, Entresto 24-26 but will hold [place]-Hydrochlorothiazide in anticipation for Surgery    COPD with associated lung sarcoidosis -Continue with albuterol neb 3 mL every 6 as needed for wheezing or shortness of breath and resume fluticasone 2 sprays each nare twice daily as needed for allergies -Resume home inhalers and if necessary will need to substitute Arnuity Ellipta with budesonide; currently takes the fluticasone furoate as needed  BPH -Continue Finasteride 5 mg po Daily and Tadalafil 5 mg po qHS   OSA on CPAP -CPAP at bedtime   Abnormal LFTs Hyperbilirubinemia -LFT and Bilirubin Trend: Recent Labs  Lab 12/08/23 1038 12/09/23 0526  AST 219* 142*  ALT 88* 111*  BILITOT 1.5* 2.6*  -In the setting of Above -Continue to Monitor and Trend Hepatic Fxn carefully and repeat CMP in the AM  Normocytic Anemia -Hgb/Hct Trend: Recent Labs  Lab 12/08/23 1038 12/09/23 0526  HGB 12.7* 12.2*  HCT 38.3* 37.1*  MCV 94.1 95.4  -Check Anemia Panel in the AM -Continue to Monitor and Trend and repeat CBC in the AM  Thrombocytopenia -Platelet Trend: Recent Labs  Lab 12/08/23 1038 12/09/23 0526  PLT 141* 129*  -Continue to Monitor for S/Sx of Bleeding  -Continue to Monitor and Trend and Repeat CBC in the AM  GERD/GI Prophylaxis -C/w PPI with Pantoprazole 40 mg po Daily    DVT prophylaxis: Anticoagulated with Eliquis    Code Status: Full Code Family Communication: No family currently at bedside  Disposition Plan:  Level of care: Telemetry Status is: Inpatient Remains inpatient appropriate because: Needs further clinical improvement and clearance by the specialists   Consultants:  General Surgery  Procedures:  As  delineated as above  Antimicrobials:  Anti-infectives (From admission, onward)    None       Subjective: Seen and examined at bedside and was doing okay.  Had some crampy abdominal pain and nausea but no vomiting.  No lightheadedness or dizziness.  No other concerns or complaints at this time and plans for surgical intervention tomorrow morning.  Objective: Vitals:   12/08/23 2223 12/08/23 2332 12/09/23 0248 12/09/23 1335  BP:  (!) 112/54 120/61 129/62  Pulse: 61 61 60 60  Resp: 17 16 16 17   Temp:  100 F (37.8 C) 99.2 F (37.3 C) 98.9 F (37.2 C)  TempSrc:  Oral Oral Oral  SpO2: 96% 96% 99% 100%    Intake/Output Summary (Last 24 hours) at 12/09/2023 1706 Last data filed at 12/09/2023 1500 Gross per 24 hour  Intake 124.71 ml  Output 620 ml  Net -495.29 ml   There were no vitals filed for this visit.  Examination: Physical Exam:  Constitutional: Elderly Caucasian male no acute distress Respiratory: Diminished to auscultation bilaterally, no wheezing, rales, rhonchi or crackles. Normal respiratory effort and patient is not tachypenic. No accessory muscle use.  Unlabored breathing Cardiovascular: RRR, no murmurs / rubs / gallops. S1 and S2 auscultated. No extremity edema.  Abdomen: Soft, non-tender, distended secondary to body  habitus. Bowel sounds positive.  GU: Deferred. Musculoskeletal: No clubbing / cyanosis of digits/nails. No joint deformity upper and lower extremities.  Skin: No rashes, lesions, ulcers on a limited skin evaluation. No induration; Warm and dry.  Neurologic: CN 2-12 grossly intact with no focal deficits. Romberg sign and cerebellar reflexes not assessed.  Psychiatric: Normal judgment and insight. Alert and oriented x 3. Normal mood and appropriate affect.   Data Reviewed: I have personally reviewed following labs and imaging studies  CBC: Recent Labs  Lab 12/08/23 1038 12/09/23 0526  WBC 6.7 6.6  NEUTROABS 5.5  --   HGB 12.7* 12.2*  HCT  38.3* 37.1*  MCV 94.1 95.4  PLT 141* 129*   Basic Metabolic Panel: Recent Labs  Lab 12/08/23 1038 12/09/23 0526  NA 140 133*  K 4.0 3.7  CL 105 100  CO2 28 24  GLUCOSE 103* 95  BUN 21 21  CREATININE 1.14 1.14  CALCIUM 10.0 9.5  MG  --  1.7  PHOS  --  2.6   GFR: Estimated Creatinine Clearance: 51.7 mL/min (by C-G formula based on SCr of 1.14 mg/dL). Liver Function Tests: Recent Labs  Lab 12/08/23 1038 12/09/23 0526  AST 219* 142*  ALT 88* 111*  ALKPHOS 112 117  BILITOT 1.5* 2.6*  PROT 7.1 6.8  ALBUMIN 4.3 3.9   Recent Labs  Lab 12/08/23 1038 12/09/23 0526  LIPASE 32 26   No results for input(s): "AMMONIA" in the last 168 hours. Coagulation Profile: No results for input(s): "INR", "PROTIME" in the last 168 hours. Cardiac Enzymes: No results for input(s): "CKTOTAL", "CKMB", "CKMBINDEX", "TROPONINI" in the last 168 hours. BNP (last 3 results) No results for input(s): "PROBNP" in the last 8760 hours. HbA1C: No results for input(s): "HGBA1C" in the last 72 hours. CBG: No results for input(s): "GLUCAP" in the last 168 hours. Lipid Profile: No results for input(s): "CHOL", "HDL", "LDLCALC", "TRIG", "CHOLHDL", "LDLDIRECT" in the last 72 hours. Thyroid Function Tests: No results for input(s): "TSH", "T4TOTAL", "FREET4", "T3FREE", "THYROIDAB" in the last 72 hours. Anemia Panel: No results for input(s): "VITAMINB12", "FOLATE", "FERRITIN", "TIBC", "IRON", "RETICCTPCT" in the last 72 hours. Sepsis Labs: No results for input(s): "PROCALCITON", "LATICACIDVEN" in the last 168 hours.  No results found for this or any previous visit (from the past 240 hours).   Radiology Studies: US Abdomen Complete Result Date: 12/08/2023 CLINICAL DATA:  Abdominal pain for 2 weeks EXAM: ABDOMEN ULTRASOUND COMPLETE COMPARISON:  CT chest, abdomen and pelvis from 05/24/2023 FINDINGS: Gallbladder: Small amount of layering sludge and suspected tiny nonshadowing gallstones (several small  calcified gallstones were present within the gallbladder on 05/24/2023 CT abdomen study). No definite gallbladder wall thickening. No pericholecystic fluid. Sonographic Eulah Pont sign is present per the technologist. A 7 mm gallbladder polyp is identified. Common bile duct: Not visualized. No appreciable intrahepatic biliary ductal dilatation. Liver: No focal lesion identified. Within normal limits in parenchymal echogenicity. Portal vein is patent on color Doppler imaging with normal direction of blood flow towards the liver. IVC: No abnormality visualized. Pancreas: Visualized portion unremarkable. Spleen: Size and appearance within normal limits. Right Kidney: Length: 11.0 cm. Echogenicity within normal limits. No mass or hydronephrosis visualized. Left Kidney: Length: 10.3 cm. Echogenicity within normal limits. No hydronephrosis. Exophytic simple 1.9 x 1.7 x 1.9 cm lower left renal cyst. Abdominal aorta: No aneurysm visualized. Other findings: None. IMPRESSION: 1. Small amount of layering sludge and suspected tiny nonshadowing gallstones (several small calcified gallstones were present within the gallbladder on  05/24/2023 CT abdomen study). Sonographic Eulah Pont sign is present per the technologist, but there is no definite gallbladder wall thickening or pericholecystic fluid. Consider further evaluation with nuclear medicine hepatobiliary scan as clinically warranted. 2. A 7 mm gallbladder polyp. Recommend follow-up right upper quadrant ultrasound in 6 months. 3. Common bile duct not visualized. No appreciable intrahepatic biliary ductal dilatation. 4. Simple 1.9 cm lower left renal cyst, for which no follow-up imaging is recommended. Electronically Signed   By: Delbert Phenix M.D.   On: 12/08/2023 10:46   Scheduled Meds:  cholecalciferol  1,000 Units Oral Daily   dorzolamide-timolol  1 drop Both Eyes BID   finasteride  5 mg Oral QPC breakfast   latanoprost  1 drop Both Eyes QHS   multivitamin with minerals  1  tablet Oral Daily   pantoprazole  40 mg Oral Daily   rosuvastatin  20 mg Oral QHS   sacubitril-valsartan  1 tablet Oral BID   tadalafil  5 mg Oral QHS   Continuous Infusions:  heparin 1,250 Units/hr (12/09/23 1437)    LOS: 0 days   Marguerita Merles, DO [facility] Available via Epic secure chat 7am-7pm After these hours, please refer to coverage provider listed on [email or web address] 12/09/2023, 5:06 PM

## 2023-12-09 NOTE — Telephone Encounter (Signed)
Please see previous phone note from 12/07/23. Patient was given samples of xifaxan. No need for PA. Thanks

## 2023-12-09 NOTE — Hospital Course (Signed)
Patient is an 82 year old Caucasian male with a past medical history significant for abdominal to CAD status post CABG and NSTEMI, history of chronic diastolic heart failure with preserved ejection fraction, mitral valve prolapse, pulmonary sarcoidosis, BPH, GERD, hypertension, hyperlipidemia, OSA on CPAP, permanent atrial fibrillation on anticoagulation as well as other comorbidities presented to the ED for evaluation of epigastric right upper quadrant pain.  He has had abdominal pain for about a week that has been intermittent and described the pain as sharp in the right upper quadrant and epigastric that sometimes radiates to his right side of his back.  He had this colicky pain for 10 the last week and it came on while he is sleeping at night.  Each episode of progressively worse and lasted longer than the 1 before.  He discussed this with the GI doctor who ordered an abdominal ultrasound to further evaluate.  The day prior to admission he ended up eating ham and white bread around 7 PM and woke up 3 AM with significant pain so he went to his ultrasound appointment prior to going to the [facility].  Was found to have cholelithiasis and abnormal LFTs and hyperbilirubinemia and was admitted for symptomatic cholelithiasis.  General surgery was consulted and they are planning on taking the patient to the OR in the morning as his Eliquis is being washed out.  He has been placed on a heparin drip now  Surgical intervention yesterday and is doing fairly well today.  Postoperatively he had an AKI but is starting to improve with fluid hydration.  He was recommended to hold his Maxzide for now and repeat CMP within a week.  He is medically stable for discharge at this time will need to follow-up with PCP, cardiology and general surgery in outpatient setting  Assessment and Plan:  Abdominal pain, nausea, vomiting with associated symptomatic cholelithiasis and hyperbilirubinemia and abnormal LFTs as below -Patient  presented with 1 week of intermittent colicky pain that progressed over the last few days associate with nausea and vomiting and was found to have cholelithiasis on imaging without signs of acute cholecystitis or biliary dilatation -LFTs and bilirubin trend as below -On admission patient was febrile at 100.5 and mildly tachycardic with a pulse rate of 101 -Ultrasound was done and showed cholelithiasis but no pericholecystic fluid or gallbladder wall thickening -Lipase level was normal -Normal WBC count but has been spiking fevers yesterday indicating early signs of developing cholecystitis -WBC trend: Recent Labs  Lab 12/08/23 1038 12/09/23 0526 12/10/23 0625 12/11/23 0542  WBC 6.7 6.6 5.0 6.0  -General Surgery was consulted and evaluated and his Eliquis and anticoagulation has been held and switched to heparin drip.  Last dose was yesterday morning -Continue clear liquid diet and continue with antiemetics with IV Zofran for nausea or vomiting and IV Dilaudid for pain -General surgery PA is discussing with the general surgeon about if MRCP is needed given that the patient has a permanent pacemaker and he would need to go to comfort that versus just repeating labs in the a.m. with plans for IOC at the time of laparoscopic cholecystectomy. -Continue To monitor trend CBC, CMP and fever curve -Underwent surgical intervention yesterday and is doing fairly well today.  He is cleared from a surgical standpoint and pain regimen has been given to the patient by the surgeon.  Will need to follow-up with PCP, general surgery and cardiology outpatient setting given that he is medically stable at this time  Persistent A-fib -Patient on  Eliquis 5 mg twice daily.  And his last dose was yesterday morning HR stable in the 60s with ventricularly paced rhythm. -Continue to monitor on telemetry -Hold Eliquis and transition to heparin drip but will hold for Surgical Intervention -Trend and replete electrolytes    Chronic HFpEF Aortic stenosis Mitral valve prolapse -Last TTE on 11/14 showed EF 55 to 60%, mildly elevated PASP, severely dilated LA, moderately dilated RA, moderate mitral valve regurg and moderate to severe aortic valve stenosis.  Patient euvolemic on exam. -Continue Entresto -Hold his Maxzide for now have PCP resume    Essential HTN -Continue with Entresto and hold Maxzide -Continue monitor blood pressures per protocol -Last blood pressure reading was 124/60  Glaucoma  -Continue with Bimatoprost 1 drop in both eyes nightly as well as Dorzolamide-Timolol 1 drop in both eyes twice daily  Hyponatremia -Will discontinue his Maxzide for now and continue to monitor sodiums -Na+ Trend: Recent Labs  Lab 12/08/23 1038 12/09/23 0526 12/10/23 0625 12/11/23 0542 12/11/23 1501  NA 140 133* 130* 130* 130*  -Repeat CMP in the AM   CAD Hx of NSTEMI HLD -Continue Rosuvastatin 20 mg po qHS, Entresto 24-26 but will hold Triameterne-Hydrochlorothiazide in anticipation for Surgery    COPD with associated Lung Sarcoidosis -Continue with albuterol neb 3 mL every 6 as needed for wheezing or shortness of breath and resume fluticasone 2 sprays each nare twice daily as needed for allergies -Resume home inhalers and if necessary will need to substitute Arnuity Ellipta with budesonide; currently takes the fluticasone furoate as needed  BPH -Continue Finasteride 5 mg po Daily and Tadalafil 5 mg po qHS   OSA on CPAP -CPAP at bedtime  AKI -BUN/Cr Trend: Recent Labs  Lab 12/08/23 1038 12/09/23 0526 12/10/23 0625 12/11/23 0542 12/11/23 1501  BUN 21 21 18  33* 38*  CREATININE 1.14 1.14 1.05 1.57* 1.35*  -Avoid Nephrotoxic Medications, Contrast Dyes, Hypotension and Dehydration to Ensure Adequate Renal Perfusion and will need to Renally Adjust Meds -Continue to Monitor and Trend Renal Function carefully and repeat CMP within 1 week   Abnormal LFTs, improved Hyperbilirubinemia, improved   -LFT and Bilirubin Trend: Recent Labs  Lab 12/08/23 1038 12/09/23 0526 12/10/23 0625 12/11/23 0542 12/11/23 1501  AST 219* 142* 59* 45* 37  ALT 88* 111* 73* 56* 44  BILITOT 1.5* 2.6* 2.1* 1.2* 0.9  -In the setting of Above -Continue to Monitor and Trend Hepatic Fxn carefully and repeat CMP in the AM  Normocytic Anemia -Hgb/Hct Trend: Recent Labs  Lab 12/08/23 1038 12/09/23 0526 12/10/23 0625 12/11/23 0542  HGB 12.7* 12.2* 12.7* 13.5  HCT 38.3* 37.1* 38.6* 40.0  MCV 94.1 95.4 95.3 94.3  -Check Anemia Panel in the AM -Continue to Monitor and Trend and repeat CBC in the AM  Thrombocytopenia -Platelet Trend: Recent Labs  Lab 12/08/23 1038 12/09/23 0526 12/10/23 0625 12/11/23 0542  PLT 141* 129* 125* 147*  -Continue to Monitor for S/Sx of Bleeding  -Continue to Monitor and Trend and Repeat CBC in the AM  GERD/GI Prophylaxis -C/w PPI with Pantoprazole 40 mg po Daily

## 2023-12-09 NOTE — Telephone Encounter (Signed)
Pharmacy Patient Advocate Encounter   Received notification from Patient Pharmacy that prior authorization for [facility] 550MG  is required/requested.   Insurance verification completed.   The patient is insured through [employer] .   Per test claim: PA required; PA submitted to above mentioned insurance via CoverMyMeds Key/confirmation #/EOC [id number] Status is pending

## 2023-12-09 NOTE — Progress Notes (Signed)
PHARMACY - ANTICOAGULATION CONSULT NOTE  Pharmacy Consult for IV heparin Indication: atrial fibrillation  Allergies  Allergen Reactions   Nsaids     Contraindicated due to being on Eliquis.    Patient Measurements:   Heparin Dosing Weight: 83.9 kg   Vital Signs: Temp: 99.9 F (37.7 C) (12/13 2022) Temp Source: Oral (12/13 2022) BP: 121/60 (12/13 2022) Pulse Rate: 62 (12/13 2050)  Labs: Recent Labs    12/08/23 1038 12/09/23 0526 12/09/23 2229  HGB 12.7* 12.2*  --   HCT 38.3* 37.1*  --   PLT 141* 129*  --   APTT  --   --  75*  CREATININE 1.14 1.14  --     Estimated Creatinine Clearance: 51.7 mL/min (by C-G formula based on SCr of 1.14 mg/dL).   Medical History: Past Medical History:  Diagnosis Date   Anemia    iron deficiency anemia- infusion on 02/15/2020 and 02/22/2020   Atrial fibrillation ([name]) 09/2023   BENIGN PROSTATIC HYPERTROPHY, WITH OBSTRUCTION 05/28/2010   CAD (coronary artery disease)    a. s/p NSTEMI 06/01/11: DES to RCA;  b. cath 06/25/11:   dLM 50-60% (FFR 0.87), prox to mid LAD 40-50%, D1 50%, pCFX 50%, RCA stent ok, dPDA 80%, EF 55-60%.  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   DISH (diffuse idiopathic skeletal hyperostosis) 12/02/2015   Diverticulitis    GENERALIZED OSTEOARTHROSIS UNSPECIFIED SITE 05/28/2010   GERD 05/28/2010   Heart murmur    History of gout    History of kidney stones     HYPERLIPIDEMIA 05/28/2010   Hypertension    IgM lambda paraproteinemia    Intrinsic asthma, 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   "associated w/lung sarcoidosis"   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   MITRAL VALVE PROLAPSE 05/28/2010   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   NSTEMI (non-ST elevated myocardial infarction) ([name]) 06/03/2011   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   OSA on CPAP 05/28/2010   PALPITATIONS, CHRONIC 05/28/2010   Parotid gland pain 2010   infection   Peripheral neuropathy    "tx'd w/targeted chemo" (05/12/2016)   Pneumonia 2-3 times   Presence of permanent cardiac pacemaker    [employer]   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 180 tablet 1 12/08/2023 at  7:00 AM   bimatoprost (LUMIGAN) 0.01 % SOLN  Place 1 drop into both eyes at bedtime.   12/07/2023 Bedtime   Cholecalciferol 25 MCG (1000 UT) tablet Take 1,000 Units by mouth daily.    12/08/2023 Morning   diclofenac Sodium (VOLTAREN) 1 % GEL Apply 2 g topically 4 (four) times daily as needed (pain).   Taking As Needed   dorzolamide-timolol (COSOPT) 22.3-6.8 MG/ML ophthalmic solution Place 1 drop into both eyes 2 (two) times daily.    12/08/2023 Morning   finasteride (PROSCAR) 5 MG tablet Take 5 mg by mouth daily after breakfast.    12/08/2023 Morning   fluticasone (FLONASE) 50 MCG/ACT nasal spray Place 2 sprays into both nostrils 2 (two) times daily.   Taking   Fluticasone Furoate  (ARNUITY ELLIPTA) 100 MCG/ACT AEPB Inhale 1 puff into the lungs 2 (two) times daily. 30 each 11 Taking   Multiple Vitamin (MULTIVITAMIN WITH MINERALS) TABS tablet Take 1 tablet by mouth daily.   12/07/2023 Evening   pantoprazole (PROTONIX) 40 MG tablet Take 1 tablet (40 mg total) by mouth 2 (two) times daily. (Patient taking differently: Take 40 mg by mouth daily.) 60 tablet 5 12/08/2023 Morning   Polyethyl Glycol-Propyl Glycol (SYSTANE OP) Place 1 drop into both eyes daily as needed (dry eyes).   12/07/2023 Morning   rosuvastatin (CRESTOR) 20 MG tablet Take 1 tablet by mouth once daily 90 tablet 2 12/07/2023 Bedtime   sacubitril-valsartan (ENTRESTO) 24-26 MG Take 1 tablet by mouth 2 (two) times daily. 180 tablet 3 12/08/2023 Morning   tadalafil (CIALIS) 5 MG tablet Take 5 mg by mouth at bedtime.    12/07/2023 Bedtime   triamcinolone cream (KENALOG) 0.1 % Apply 1 Application topically 2 (two) times daily.   Past Week   triamterene-hydrochlorothiazide (MAXZIDE-25) 37.5-25 MG tablet Take 1 tablet by mouth once daily 90 tablet 3 12/08/2023 Morning   rifaximin (XIFAXAN) 550 MG TABS tablet Take 1 tablet (550 mg total) by mouth 3 (three) times daily for 14 days. 42 tablet 0     Assessment: Pharmacy is consulted to start heparin drip on 82 yo male with PMH of afib. Pt PMH also includes  CAD s/p CABG, NSTEMI, CHF, mitral valve prolapse,pulmonary sarcoidosis, BPH, GERD, HLD, OSA on CPAP. Pt is currently scheduled to undergo OR procedure on 12/14 after a two day washout of apixaban. LD of apixaban was on 12/12 at 0700 per med rec.   Today, 12/09/23  Hgb 12.2, plt 129  Scr 1.14 mg/dl, CrCl 52 ml/min  Messaged Trixie Deis PA with the surgery team, stop heparin drip at 0200 on 12/14    2229 aPTT 75 therapeutic on 1250 units/hr No bleeding noted  Goal of Therapy:  Heparin level 0.3-0.7 units/ml aPTT 66-102 sec  Monitor platelets by anticoagulation protocol: Yes   Plan:  continue heparin drip at  1250 units/hr  Will avoid bolus due to recent apixaban use Daily CBC while on heparin  Stop heparin drip at 0200 on 12/14 Monitor for signs and symptoms of bleeding    Arley Phenix RPh 12/09/2023, 11:01 PM

## 2023-12-09 NOTE — Consult Note (Signed)
Consult Note  Derrick [name] [facility] 1941/04/06  [id number].    Requesting MD: Sharrell Ku, MD Chief Complaint/Reason for Consult: RUQ pain  HPI:  Patient is an 82 year old male who presented to the ED with RUQ and epigastric abdominal pain. Pain started about 1 week prior to presentation and occurred intermittently. Pain noted to be sharp and sometimes with radiation to the right back. Generally occurs at night. Episodes have progressively worsened in intensity and duration. Associated anorexia, nausea, vomiting. Denies SOB, chills, fever at home, chest pain, urinary symptoms, change in bowel habit. Found to have cholelithiasis but no pericholecystic fluid or gallbladder wall thickening, positive sonographic Murphy sign noted. He was noted to have some mildly elevated LFTs on admission but no leukocytosis, lipase was WNL.   He was admitted to the hospitalist service and general surgery consulted for consideration of cholecystectomy. On my exam this morning he report low grade fever overnight and more LUQ pain now. He did not associate worsened pain overnight with PO intake but had not felt like taking much in.   PMH otherwise significant for CAD s/p CABG, NSTEMI, CHF, mitral valve prolapse,pulmonary sarcoidosis, BPH, GERD, HLD, OSA on CPAP and A-fib on Eliquis. Prior abdominal surgery includes an open partial colectomy for colon cancer in 2000, open appendectomy in 1954 and left inguinal hernia repair with mesh in 2020. He has a BIV pacemaker in place. He walks 30 min daily and does not report any decreased exercise tolerance in the last few weeks.   ROS: Negative other than HPI  Family History  Problem Relation Age of Onset   Arthritis Mother        severe   Coronary artery disease Mother    Colon polyps Mother    Heart disease Mother    Sleep apnea Mother    Sleep apnea Father    Colon polyps Brother    Coronary artery disease Brother    Cancer Brother        choriocarcinoma    Arrhythmia Brother 87       Afib/Tachycardia   Heart attack Brother    Congestive Heart Failure Brother    Sleep apnea Brother    Colon cancer Cousin    Crohn's disease Neg Hx    Esophageal cancer Neg Hx    Rectal cancer Neg Hx    Stomach cancer Neg Hx     Past Medical History:  Diagnosis Date   Anemia    iron deficiency anemia- infusion on 02/15/2020 and 02/22/2020   Atrial fibrillation ([name]) 09/2023   BENIGN PROSTATIC HYPERTROPHY, WITH OBSTRUCTION 05/28/2010   CAD (coronary artery disease)    a. s/p NSTEMI 06/01/11: DES to RCA;  b. cath 06/25/11:   dLM 50-60% (FFR 0.87), prox to mid LAD 40-50%, D1 50%, pCFX 50%, RCA stent ok, dPDA 80%, EF 55-60%.  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   DISH (diffuse idiopathic skeletal hyperostosis) 12/02/2015   Diverticulitis    GENERALIZED OSTEOARTHROSIS UNSPECIFIED SITE 05/28/2010   GERD 05/28/2010   Heart murmur    History of gout    History of kidney stones    HYPERLIPIDEMIA 05/28/2010   Hypertension    IgM lambda paraproteinemia    Intrinsic asthma, 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   "associated w/lung sarcoidosis"   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   MITRAL VALVE PROLAPSE 05/28/2010   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   NSTEMI (non-ST elevated myocardial infarction) ([name]) 06/03/2011   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   OSA  on CPAP 05/28/2010   PALPITATIONS, CHRONIC 05/28/2010   Parotid gland pain 2010   infection   Peripheral neuropathy    "tx'd w/targeted chemo" (05/12/2016)   Pneumonia 2-3 times   Presence of permanent cardiac pacemaker    [employer]   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   Sarcoidosis    pulmonalis   Sleep apnea    UNSPECIFIED INFLAMMATORY AND TOXIC NEUROPATHY 05/28/2010   Vision loss    VITAMIN D DEFICIENCY 05/28/2010    Past Surgical History:  Procedure Laterality Date   APPENDECTOMY  1954   age 30   BIV UPGRADE N/A 09/23/2022   Procedure: BIV PACEMAKER UPGRADE;  Surgeon: Regan Lemming, MD;  Location: [facility] INVASIVE CV LAB;  Service: Cardiovascular;  Laterality: N/A;   CATARACT EXTRACTION W/ INTRAOCULAR LENS IMPLANT Left    COLON SURGERY  2000   decending colon    COLONOSCOPY     CORONARY ANGIOPLASTY WITH STENT PLACEMENT  06/03/2011   CORONARY ARTERY BYPASS GRAFT N/A 06/05/2019   Procedure: CORONARY ARTERY BYPASS GRAFTING (CABG) TIMES THREE USING LEFT INTERNAL MAMMARY ARTERY AND RIGHT GREATER SEPHANOUS VEIN;  Surgeon: Kerin Perna, MD;  Location: [facility] OR;  Service: Open Heart Surgery;  Laterality: N/A;   CORONARY PRESSURE/FFR STUDY N/A 05/15/2019   Procedure: INTRAVASCULAR PRESSURE WIRE/FFR STUDY;  Surgeon: Tonny Bollman, MD;  Location: [facility] INVASIVE CV LAB;  Service: Cardiovascular;  Laterality: N/A;   ELECTROPHYSIOLOGIC STUDY N/A 05/12/2016   Procedure: Cardioversion;  Surgeon: Will Jorja Loa, MD;  Location: [facility] INVASIVE CV LAB;  Service: Cardiovascular;  Laterality: N/A;   EP IMPLANTABLE DEVICE N/A 05/12/2016   Procedure: Pacemaker Implant;  Surgeon: Will Jorja Loa, MD;  Location: [facility] INVASIVE CV LAB;  Service: Cardiovascular;  Laterality: N/A;   EYE SURGERY     bilateral cataract with lens implant   HERNIA REPAIR  01/2019   left inguinal hernia repair   INGUINAL HERNIA REPAIR Left 02/21/2019   Procedure: LEFT INGUINAL HERNIA REPAIR WITH MESH;  Surgeon:  Harriette Bouillon, MD;  Location: [facility] OR;  Service: General;  Laterality: Left;   INSERT / REPLACE / REMOVE PACEMAKER  05/12/2016   KNEE ARTHROSCOPY Right 2003   LEFT HEART CATH AND CORONARY ANGIOGRAPHY N/A 05/15/2019   Procedure: LEFT HEART CATH AND CORONARY ANGIOGRAPHY;  Surgeon: Tonny Bollman, MD;  Location: [facility] INVASIVE CV LAB;  Service: Cardiovascular;  Laterality: N/A;   LEFT HEART CATHETERIZATION WITH CORONARY ANGIOGRAM N/A 03/31/2015   Procedure: LEFT HEART CATHETERIZATION WITH CORONARY ANGIOGRAM;  Surgeon: Tonny Bollman, MD;  Location: [facility] CATH LAB;  Service: Cardiovascular;  Laterality: N/A;   LUNG SURGERY  2001   "open lung dissection"   MOHS SURGERY Left ~ 2008   ear   PROSTATE BIOPSY  2010   TEE WITHOUT CARDIOVERSION N/A 06/05/2019   Procedure: TRANSESOPHAGEAL ECHOCARDIOGRAM (TEE);  Surgeon: Zenaida Niece  Greg Cutter, MD;  Location: [facility] OR;  Service: Open Heart Surgery;  Laterality: N/A;   TONSILLECTOMY  1950   TOTAL KNEE ARTHROPLASTY Right 03/11/2020   Procedure: TOTAL KNEE ARTHROPLASTY;  Surgeon: Durene Romans, MD;  Location: [facility] ORS;  Service: Orthopedics;  Laterality: Right;  70 mins    Social History:  reports that he has never smoked. He has never used smokeless tobacco. He reports current alcohol use. He reports that he does not use drugs.  Allergies:  Allergies  Allergen Reactions   Nsaids     Contraindicated due to being on Eliquis.    Medications Prior to Admission  Medication Sig Dispense Refill   albuterol (VENTOLIN HFA) 108 (90 Base) MCG/ACT inhaler INHALE 2 PUFFS BY MOUTH EVERY 6 HOURS AS NEEDED FOR WHEEZING FOR SHORTNESS OF BREATH 9 g 0   apixaban (ELIQUIS) 5 MG TABS tablet Take 1 tablet (5 mg total) by mouth 2 (two) times daily. 180 tablet 1   bimatoprost (LUMIGAN) 0.01 % SOLN Place 1 drop into both eyes at bedtime.     Cholecalciferol 25 MCG (1000 UT) tablet Take 1,000 Units by mouth daily.      diclofenac Sodium (VOLTAREN) 1 % GEL Apply 2 g topically 4 (four) times  daily as needed (pain).     dorzolamide-timolol (COSOPT) 22.3-6.8 MG/ML ophthalmic solution Place 1 drop into both eyes 2 (two) times daily.      finasteride (PROSCAR) 5 MG tablet Take 5 mg by mouth daily after breakfast.      fluticasone (FLONASE) 50 MCG/ACT nasal spray Place 2 sprays into both nostrils 2 (two) times daily.     Fluticasone Furoate (ARNUITY ELLIPTA) 100 MCG/ACT AEPB Inhale 1 puff into the lungs 2 (two) times daily. 30 each 11   Multiple Vitamin (MULTIVITAMIN WITH MINERALS) TABS tablet Take 1 tablet by mouth daily.     pantoprazole (PROTONIX) 40 MG tablet Take 1 tablet (40 mg total) by mouth 2 (two) times daily. (Patient taking differently: Take 40 mg by mouth daily.) 60 tablet 5   Polyethyl Glycol-Propyl Glycol (SYSTANE OP) Place 1 drop into both eyes daily as needed (dry eyes).     rosuvastatin (CRESTOR) 20 MG tablet Take 1 tablet by mouth once daily 90 tablet 2   sacubitril-valsartan (ENTRESTO) 24-26 MG Take 1 tablet by mouth 2 (two) times daily. 180 tablet 3   tadalafil (CIALIS) 5 MG tablet Take 5 mg by mouth at bedtime.      triamcinolone cream (KENALOG) 0.1 % Apply 1 Application topically 2 (two) times daily.     triamterene-hydrochlorothiazide (MAXZIDE-25) 37.5-25 MG tablet Take 1 tablet by mouth once daily 90 tablet 3   rifaximin (XIFAXAN) 550 MG TABS tablet Take 1 tablet (550 mg total) by mouth 3 (three) times daily for 14 days. 42 tablet 0    Blood pressure 120/61, pulse 60, temperature 99.2 F (37.3 C), temperature source Oral, resp. rate 16, SpO2 99%. Physical Exam:  General: pleasant, WD, WN male who is laying in bed in NAD HEENT: head is normocephalic, atraumatic.  Sclera are anicteric. EOMI and pupils equal and round.  Ears and nose without any masses or lesions.  Mouth is pink and moist Heart: regular, rate, and rhythm.   Lungs: CTAB, no wheezes, rhonchi, or rales noted.  Respiratory effort nonlabored Abd: soft, NT, ND, lower midline surgical scar MS: all 4  extremities are symmetrical with no cyanosis, clubbing, or edema. Skin: warm and dry with no masses, lesions, or rashes Neuro:  Cranial nerves 2-12 grossly intact, sensation is normal throughout Psych: A&Ox3 with an appropriate affect.   Results for orders placed or performed during the hospital encounter of 12/08/23 (from the past 48 hours)  Comprehensive metabolic panel     Status: Abnormal   Collection Time: 12/08/23 10:38 AM  Result Value Ref Range   Sodium 140 135 - 145 mmol/L   Potassium 4.0 3.5 - 5.1 mmol/L   Chloride 105 98 - 111 mmol/L   CO2 28 22 - 32 mmol/L   Glucose, Bld 103 (H) 70 - 99 mg/dL    Comment: Glucose reference range applies only to samples taken after fasting for at least 8 hours.   BUN 21 8 - 23 mg/dL   Creatinine, Ser 9.60 0.61 - 1.24 mg/dL   Calcium 45.4 8.9 - 09.8 mg/dL   Total Protein 7.1 6.5 - 8.1 g/dL   Albumin 4.3 3.5 - 5.0 g/dL   AST 119 (H) 15 - 41 U/L   ALT 88 (H) 0 - 44 U/L   Alkaline Phosphatase 112 38 - 126 U/L   Total Bilirubin 1.5 (H) <1.2 mg/dL   GFR, Estimated >14 >78 mL/min    Comment: (NOTE) Calculated using the CKD-EPI Creatinine Equation (2021)    Anion gap 7 5 - 15    Comment: Performed at [facility], [address] [place], [place] [address]  Lipase, blood     Status: None   Collection Time: 12/08/23 10:38 AM  Result Value Ref Range   Lipase 32 11 - 51 U/L    Comment: Performed at [facility], [address], [place], [place] [address]  CBC with Differential     Status: Abnormal   Collection Time: 12/08/23 10:38 AM  Result Value Ref Range   WBC 6.7 4.0 - 10.5 K/uL   RBC 4.07 (L) 4.22 - 5.81 MIL/uL   Hemoglobin 12.7 (L) 13.0 - 17.0 g/dL   HCT 57.8 (L) 46.9 - 62.9 %   MCV 94.1 80.0 - 100.0 fL   MCH 31.2 26.0 - 34.0 pg   MCHC 33.2 30.0 - 36.0 g/dL   RDW 52.8 41.3 - 24.4 %   Platelets 141 (L) 150 - 400 K/uL   nRBC 0.0 0.0 - 0.2 %   Neutrophils Relative % 82 %   Neutro Abs 5.5 1.7 -  7.7 K/uL   Lymphocytes Relative 8 %   Lymphs Abs 0.6 (L) 0.7 - 4.0 K/uL   Monocytes Relative 6 %   Monocytes Absolute 0.4 0.1 - 1.0 K/uL   Eosinophils Relative 4 %   Eosinophils Absolute 0.2 0.0 - 0.5 K/uL   Basophils Relative 0 %   Basophils Absolute 0.0 0.0 - 0.1 K/uL   Immature Granulocytes 0 %   Abs Immature Granulocytes 0.02 0.00 - 0.07 K/uL    Comment: Performed at [facility], [address], [place], [place] [address]  CBC     Status: Abnormal   Collection Time: 12/09/23  5:26 AM  Result Value Ref Range   WBC 6.6 4.0 - 10.5 K/uL   RBC 3.89 (L) 4.22 - 5.81 MIL/uL   Hemoglobin 12.2 (L) 13.0 - 17.0 g/dL   HCT 25.3 (L) 66.4 - 40.3 %   MCV 95.4 80.0 - 100.0 fL   MCH 31.4 26.0 - 34.0 pg   MCHC 32.9 30.0 - 36.0 g/dL   RDW 47.4 25.9 - 56.3 %   Platelets 129 (L) 150 - 400 K/uL    Comment:  REPEATED TO VERIFY   nRBC 0.0 0.0 - 0.2 %    Comment: Performed at [facility], [address] [place], [place] [address]  Comprehensive metabolic panel     Status: Abnormal   Collection Time: 12/09/23  5:26 AM  Result Value Ref Range   Sodium 133 (L) 135 - 145 mmol/L   Potassium 3.7 3.5 - 5.1 mmol/L   Chloride 100 98 - 111 mmol/L   CO2 24 22 - 32 mmol/L   Glucose, Bld 95 70 - 99 mg/dL    Comment: Glucose reference range applies only to samples taken after fasting for at least 8 hours.   BUN 21 8 - 23 mg/dL   Creatinine, Ser 6.04 0.61 - 1.24 mg/dL   Calcium 9.5 8.9 - 54.0 mg/dL   Total Protein 6.8 6.5 - 8.1 g/dL   Albumin 3.9 3.5 - 5.0 g/dL   AST 981 (H) 15 - 41 U/L   ALT 111 (H) 0 - 44 U/L   Alkaline Phosphatase 117 38 - 126 U/L   Total Bilirubin 2.6 (H) <1.2 mg/dL   GFR, Estimated >19 >14 mL/min    Comment: (NOTE) Calculated using the CKD-EPI Creatinine Equation (2021)    Anion gap 9 5 - 15    Comment: Performed at [facility], [address] [place], [place] [address]  Magnesium     Status: None   Collection Time:  12/09/23  5:26 AM  Result Value Ref Range   Magnesium 1.7 1.7 - 2.4 mg/dL    Comment: Performed at [facility], [address] [place], [place] [address]  Phosphorus     Status: None   Collection Time: 12/09/23  5:26 AM  Result Value Ref Range   Phosphorus 2.6 2.5 - 4.6 mg/dL    Comment: Performed at [facility], [address], [place], [place] [address]   *Note: Due to a large number of results and/or encounters for the requested time period, some results have not been displayed. A complete set of results can be found in Results Review.   US Abdomen Complete Result Date: 12/08/2023 CLINICAL DATA:  Abdominal pain for 2 weeks EXAM: ABDOMEN ULTRASOUND COMPLETE COMPARISON:  CT chest, abdomen and pelvis from 05/24/2023 FINDINGS: Gallbladder: Small amount of layering sludge and suspected tiny nonshadowing gallstones (several small calcified gallstones were present within the gallbladder on 05/24/2023 CT abdomen study). No definite gallbladder wall thickening. No pericholecystic fluid. Sonographic Eulah Pont sign is present per the technologist. A 7 mm gallbladder polyp is identified. Common bile duct: Not visualized. No appreciable intrahepatic biliary ductal dilatation. Liver: No focal lesion identified. Within normal limits in parenchymal echogenicity. Portal vein is patent on color Doppler imaging with normal direction of blood flow towards the liver. IVC: No abnormality visualized. Pancreas: Visualized portion unremarkable. Spleen: Size and appearance within normal limits. Right Kidney: Length: 11.0 cm. Echogenicity within normal limits. No mass or hydronephrosis visualized. Left Kidney: Length: 10.3 cm. Echogenicity within normal limits. No hydronephrosis. Exophytic simple 1.9 x 1.7 x 1.9 cm lower left renal cyst. Abdominal aorta: No aneurysm visualized. Other findings: None. IMPRESSION: 1. Small amount of layering sludge and suspected tiny nonshadowing gallstones  (several small calcified gallstones were present within the gallbladder on 05/24/2023 CT abdomen study). Sonographic Eulah Pont sign is present per the technologist, but there is no definite gallbladder wall thickening or pericholecystic fluid. Consider further evaluation with nuclear medicine hepatobiliary scan as clinically warranted. 2. A 7 mm gallbladder  polyp. Recommend follow-up right upper quadrant ultrasound in 6 months. 3. Common bile duct not visualized. No appreciable intrahepatic biliary ductal dilatation. 4. Simple 1.9 cm lower left renal cyst, for which no follow-up imaging is recommended. Electronically Signed   By: Delbert Phenix M.D.   On: 12/08/2023 10:46      Assessment/Plan Symptomatic cholelithiasis  Elevated LFTs - [place] with cholelithiasis and no pericholecystic fluid or gallbladder wall thickening  - lipase was normal on admission but Tbili and AST/ALT were elevated - Tbili up to 2.6 this AM, AST/ALT 142/111 - LD eliquis 12/12 AM so would need to wait until 12/14 for OR  - discussing with MD if MRCP needed (PPM so [facility]) vs just repeat labs in AM and plan for IOC if able at time of laparoscopic cholecystectomy. Can also consider having GI weigh in  - ok to have diet as tolerated today, NPO after MN  FEN: CLD VTE: Eliquis on hold  ID: no current abx  - per [facility] -  CAD s/p CABG A. Fib on eliquis Mitral valve prolapse CHF Pulmonary sarcoidosis  BPH GERD HLD OSA on CPAP  I reviewed hospitalist notes, last 24 h vitals and pain scores, last 48 h intake and output, last 24 h labs and trends, and last 24 h imaging results.   Juliet Rude, [facility] Surgery 12/09/2023, 9:45 AM Please see Amion for pager number during day hours 7:00am-4:30pm

## 2023-12-09 NOTE — Progress Notes (Signed)
   12/09/23 2050  BiPAP/CPAP/SIPAP  BiPAP/CPAP/SIPAP Pt Type Adult  BiPAP/CPAP/SIPAP Resmed (from home)  Mask Type Nasal pillows  FiO2 (%) 21 %  Heater Temperature  (sterile water provided)  Patient Home Equipment Yes  Auto Titrate Yes (5-15)  Safety Check Completed by RT for Home Unit Yes, no issues noted  BiPAP/CPAP /SiPAP Vitals  Pulse Rate 62  Resp 17  SpO2 97 %  Bilateral Breath Sounds Clear;Diminished  MEWS Score/Color  MEWS Score 0  MEWS Score Color Green

## 2023-12-09 NOTE — Care Management Obs Status (Signed)
MEDICARE OBSERVATION STATUS NOTIFICATION   Patient Details  Name: Abdurraheem Afifi MRN: [id number] Date of Birth: October 11, 1941   Medicare Observation Status Notification Given:  Yes    Otelia Santee, LCSW 12/09/2023, 11:07 AM

## 2023-12-09 NOTE — Plan of Care (Signed)
  Problem: Activity: Goal: Risk for activity intolerance will decrease Outcome: Progressing   Problem: Nutrition: Goal: Adequate nutrition will be maintained Outcome: Progressing   Problem: Pain Management: Goal: General experience of comfort will improve Outcome: Progressing   Problem: Safety: Goal: Ability to remain free from injury will improve Outcome: Progressing

## 2023-12-10 ENCOUNTER — Inpatient Hospital Stay (HOSPITAL_COMMUNITY): Payer: Medicare Other | Admitting: Certified Registered Nurse Anesthetist

## 2023-12-10 ENCOUNTER — Inpatient Hospital Stay (HOSPITAL_COMMUNITY): Payer: Medicare Other

## 2023-12-10 ENCOUNTER — Encounter (HOSPITAL_COMMUNITY)
Admission: EM | Disposition: A | Discharge status: Home / Self Care | Payer: Self-pay | Source: Home / Self Care | Attending: Student

## 2023-12-10 ENCOUNTER — Other Ambulatory Visit: Payer: Self-pay

## 2023-12-10 DIAGNOSIS — R1011 Right upper quadrant pain: Secondary | ICD-10-CM | POA: Diagnosis not present

## 2023-12-10 DIAGNOSIS — K8020 Calculus of gallbladder without cholecystitis without obstruction: Secondary | ICD-10-CM | POA: Diagnosis not present

## 2023-12-10 DIAGNOSIS — R7401 Elevation of levels of liver transaminase levels: Secondary | ICD-10-CM | POA: Diagnosis not present

## 2023-12-10 HISTORY — PX: CHOLECYSTECTOMY: SHX55

## 2023-12-10 LAB — COMPREHENSIVE METABOLIC PANEL
ALT: 73 U/L — ABNORMAL HIGH (ref 0–44)
AST: 59 U/L — ABNORMAL HIGH (ref 15–41)
Albumin: 3.6 g/dL (ref 3.5–5.0)
Alkaline Phosphatase: 110 U/L (ref 38–126)
Anion gap: 6 (ref 5–15)
BUN: 18 mg/dL (ref 8–23)
CO2: 24 mmol/L (ref 22–32)
Calcium: 9.8 mg/dL (ref 8.9–10.3)
Chloride: 100 mmol/L (ref 98–111)
Creatinine, Ser: 1.05 mg/dL (ref 0.61–1.24)
GFR, Estimated: >60 mL/min (ref >60)
Glucose, Bld: 90 mg/dL (ref 70–99)
Potassium: 3.9 mmol/L (ref 3.5–5.1)
Sodium: 130 mmol/L — ABNORMAL LOW (ref 135–145)
Total Bilirubin: 2.1 mg/dL — ABNORMAL HIGH (ref <1.2)
Total Protein: 6.6 g/dL (ref 6.5–8.1)

## 2023-12-10 LAB — CBC WITH DIFFERENTIAL/PLATELET
Abs Immature Granulocytes: 0.02 10*3/uL (ref 0.00–0.07)
Basophils Absolute: 0 10*3/uL (ref 0.0–0.1)
Basophils Relative: 0 %
Eosinophils Absolute: 0.1 10*3/uL (ref 0.0–0.5)
Eosinophils Relative: 2 %
HCT: 38.6 % — ABNORMAL LOW (ref 39.0–52.0)
Hemoglobin: 12.7 g/dL — ABNORMAL LOW (ref 13.0–17.0)
Immature Granulocytes: 0 %
Lymphocytes Relative: 9 %
Lymphs Abs: 0.5 10*3/uL — ABNORMAL LOW (ref 0.7–4.0)
MCH: 31.4 pg (ref 26.0–34.0)
MCHC: 32.9 g/dL (ref 30.0–36.0)
MCV: 95.3 fL (ref 80.0–100.0)
Monocytes Absolute: 0.5 10*3/uL (ref 0.1–1.0)
Monocytes Relative: 10 %
Neutro Abs: 3.9 10*3/uL (ref 1.7–7.7)
Neutrophils Relative %: 79 %
Platelets: 125 10*3/uL — ABNORMAL LOW (ref 150–400)
RBC: 4.05 MIL/uL — ABNORMAL LOW (ref 4.22–5.81)
RDW: 13.2 % (ref 11.5–15.5)
WBC: 5 10*3/uL (ref 4.0–10.5)
nRBC: 0 % (ref 0.0–0.2)

## 2023-12-10 LAB — MAGNESIUM: Magnesium: 1.7 mg/dL (ref 1.7–2.4)

## 2023-12-10 LAB — PHOSPHORUS: Phosphorus: 2.2 mg/dL — ABNORMAL LOW (ref 2.5–4.6)

## 2023-12-10 SURGERY — LAPAROSCOPIC CHOLECYSTECTOMY WITH INTRAOPERATIVE CHOLANGIOGRAM
Anesthesia: General

## 2023-12-10 MED ORDER — LACTATED RINGERS IR SOLN
Status: DC | PRN
  Administered 2023-12-10: 1000 mL

## 2023-12-10 MED ORDER — ROCURONIUM BROMIDE 10 MG/ML (PF) SYRINGE
PREFILLED_SYRINGE | INTRAVENOUS | Status: AC
  Filled 2023-12-10: qty 10

## 2023-12-10 MED ORDER — BUPIVACAINE-EPINEPHRINE 0.25% -1:200000 IJ SOLN
INTRAMUSCULAR | Status: DC | PRN
  Administered 2023-12-10: 24 mL

## 2023-12-10 MED ORDER — SODIUM CHLORIDE (PF) 0.9 % IJ SOLN
INTRAMUSCULAR | Status: DC | PRN
  Administered 2023-12-10: 5 mL

## 2023-12-10 MED ORDER — DEXAMETHASONE SODIUM PHOSPHATE 10 MG/ML IJ SOLN
INTRAMUSCULAR | Status: AC
  Filled 2023-12-10: qty 1

## 2023-12-10 MED ORDER — FENTANYL CITRATE PF 50 MCG/ML IJ SOSY
25.0000 ug | PREFILLED_SYRINGE | INTRAMUSCULAR | Status: DC | PRN
  Administered 2023-12-10 (×2): 50 ug via INTRAVENOUS

## 2023-12-10 MED ORDER — HYDROMORPHONE HCL 1 MG/ML IJ SOLN
0.5000 mg | INTRAMUSCULAR | Status: DC | PRN
  Administered 2023-12-10: 1 mg via INTRAVENOUS
  Filled 2023-12-10 (×3): qty 1

## 2023-12-10 MED ORDER — MAGNESIUM SULFATE 2 GM/50ML IV SOLN
2.0000 g | Freq: Once | INTRAVENOUS | Status: AC
  Administered 2023-12-10: 2 g via INTRAVENOUS
  Filled 2023-12-10: qty 50

## 2023-12-10 MED ORDER — ONDANSETRON HCL 4 MG/2ML IJ SOLN: 4.0000 mg | Freq: Once | INTRAMUSCULAR | Status: DC | PRN

## 2023-12-10 MED ORDER — FENTANYL CITRATE (PF) 100 MCG/2ML IJ SOLN
INTRAMUSCULAR | Status: AC
  Filled 2023-12-10: qty 2

## 2023-12-10 MED ORDER — BUPIVACAINE-EPINEPHRINE 0.25% -1:200000 IJ SOLN
INTRAMUSCULAR | Status: AC
  Filled 2023-12-10: qty 1

## 2023-12-10 MED ORDER — ROCURONIUM BROMIDE 10 MG/ML (PF) SYRINGE
PREFILLED_SYRINGE | INTRAVENOUS | Status: DC | PRN
  Administered 2023-12-10: 20 mg via INTRAVENOUS
  Administered 2023-12-10: 40 mg via INTRAVENOUS

## 2023-12-10 MED ORDER — 0.9 % SODIUM CHLORIDE (POUR BTL) OPTIME
TOPICAL | Status: DC | PRN
  Administered 2023-12-10: 1000 mL

## 2023-12-10 MED ORDER — ONDANSETRON HCL 4 MG/2ML IJ SOLN
INTRAMUSCULAR | Status: DC | PRN
  Administered 2023-12-10: 4 mg via INTRAVENOUS

## 2023-12-10 MED ORDER — HEPARIN (PORCINE) 25000 UT/250ML-% IV SOLN: 1250.0000 [IU]/h | INTRAVENOUS | Status: DC

## 2023-12-10 MED ORDER — PHENYLEPHRINE 80 MCG/ML (10ML) SYRINGE FOR IV PUSH (FOR BLOOD PRESSURE SUPPORT)
PREFILLED_SYRINGE | INTRAVENOUS | Status: DC | PRN
  Administered 2023-12-10: 120 ug via INTRAVENOUS
  Administered 2023-12-10: 160 ug via INTRAVENOUS

## 2023-12-10 MED ORDER — CEFAZOLIN SODIUM-DEXTROSE 2-4 GM/100ML-% IV SOLN
INTRAVENOUS | Status: AC
  Filled 2023-12-10: qty 100

## 2023-12-10 MED ORDER — ONDANSETRON HCL 4 MG/2ML IJ SOLN
INTRAMUSCULAR | Status: AC
Start: 2023-12-10 — End: ?
  Filled 2023-12-10: qty 2

## 2023-12-10 MED ORDER — LIDOCAINE 2% (20 MG/ML) 5 ML SYRINGE
INTRAMUSCULAR | Status: DC | PRN
  Administered 2023-12-10: 60 mg via INTRAVENOUS

## 2023-12-10 MED ORDER — HYDROMORPHONE HCL 1 MG/ML IJ SOLN
INTRAMUSCULAR | Status: DC | PRN
  Administered 2023-12-10 (×2): 1 mg via INTRAVENOUS

## 2023-12-10 MED ORDER — FENTANYL CITRATE PF 50 MCG/ML IJ SOSY
PREFILLED_SYRINGE | INTRAMUSCULAR | Status: AC
  Filled 2023-12-10: qty 2

## 2023-12-10 MED ORDER — SUGAMMADEX SODIUM 200 MG/2ML IV SOLN
INTRAVENOUS | Status: DC | PRN
  Administered 2023-12-10: 200 mg via INTRAVENOUS

## 2023-12-10 MED ORDER — LIDOCAINE HCL (PF) 2 % IJ SOLN
INTRAMUSCULAR | Status: AC
  Filled 2023-12-10: qty 5

## 2023-12-10 MED ORDER — FENTANYL CITRATE (PF) 100 MCG/2ML IJ SOLN
INTRAMUSCULAR | Status: DC | PRN
  Administered 2023-12-10: 100 ug via INTRAVENOUS

## 2023-12-10 MED ORDER — AMISULPRIDE (ANTIEMETIC) 5 MG/2ML IV SOLN: 10.0000 mg | Freq: Once | INTRAVENOUS | Status: DC | PRN

## 2023-12-10 MED ORDER — PROPOFOL 10 MG/ML IV BOLUS
INTRAVENOUS | Status: DC | PRN
  Administered 2023-12-10: 100 mg via INTRAVENOUS

## 2023-12-10 MED ORDER — CEFAZOLIN SODIUM-DEXTROSE 2-3 GM-%(50ML) IV SOLR
INTRAVENOUS | Status: DC | PRN
  Administered 2023-12-10: 2 g via INTRAVENOUS

## 2023-12-10 MED ORDER — OXYCODONE HCL 5 MG PO TABS: 5.0000 mg | ORAL_TABLET | ORAL | Status: DC | PRN

## 2023-12-10 MED ORDER — DEXAMETHASONE SODIUM PHOSPHATE 4 MG/ML IJ SOLN
INTRAMUSCULAR | Status: DC | PRN
  Administered 2023-12-10: 5 mg via INTRAVENOUS

## 2023-12-10 MED ORDER — ACETAMINOPHEN 10 MG/ML IV SOLN: 1000.0000 mg | Freq: Once | INTRAVENOUS | Status: DC | PRN

## 2023-12-10 MED ORDER — PROPOFOL 10 MG/ML IV BOLUS
INTRAVENOUS | Status: AC
  Filled 2023-12-10: qty 20

## 2023-12-10 MED ORDER — HYDROMORPHONE HCL 2 MG/ML IJ SOLN
INTRAMUSCULAR | Status: AC
  Filled 2023-12-10: qty 1

## 2023-12-10 MED ORDER — LACTATED RINGERS IV SOLN: INTRAVENOUS | Status: DC | PRN

## 2023-12-10 SURGICAL SUPPLY — 33 items
APPLIER CLIP 5 13 M/L LIGAMAX5 (MISCELLANEOUS) ×1
BAG COUNTER SPONGE SURGICOUNT (BAG) IMPLANT
CABLE HIGH FREQUENCY MONO STRZ (ELECTRODE) ×1 IMPLANT
CATH REDDICK CHOLANGI 4FR 50CM (CATHETERS) ×1 IMPLANT
CHLORAPREP W/TINT 26 (MISCELLANEOUS) ×1 IMPLANT
CLIP APPLIE 5 13 M/L LIGAMAX5 (MISCELLANEOUS) ×1 IMPLANT
COVER MAYO STAND XLG (MISCELLANEOUS) ×1 IMPLANT
DERMABOND ADVANCED .7 DNX12 (GAUZE/BANDAGES/DRESSINGS) ×1 IMPLANT
DEVICE TROCAR PUNCTURE CLOSURE (ENDOMECHANICALS) IMPLANT
DRAPE C-ARM 42X120 X-RAY (DRAPES) ×1 IMPLANT
ELECT REM PT RETURN 15FT ADLT (MISCELLANEOUS) ×1 IMPLANT
GLOVE BIO SURGEON STRL SZ7.5 (GLOVE) ×1 IMPLANT
GOWN STRL REUS W/ TWL LRG LVL3 (GOWN DISPOSABLE) IMPLANT
HEMOSTAT SNOW SURGICEL 2X4 (HEMOSTASIS) IMPLANT
IRRIG SUCT STRYKERFLOW 2 WTIP (MISCELLANEOUS) ×1
IRRIGATION SUCT STRKRFLW 2 WTP (MISCELLANEOUS) ×1 IMPLANT
IV CATH 14GX2 1/4 (CATHETERS) ×1 IMPLANT
KIT BASIN OR (CUSTOM PROCEDURE TRAY) ×1 IMPLANT
KIT TURNOVER KIT A (KITS) IMPLANT
PENCIL SMOKE EVACUATOR (MISCELLANEOUS) IMPLANT
SCISSORS LAP 5X35 DISP (ENDOMECHANICALS) ×1 IMPLANT
SET TUBE SMOKE EVAC HIGH FLOW (TUBING) ×1 IMPLANT
SLEEVE Z-THREAD 5X100MM (TROCAR) ×2 IMPLANT
SPIKE FLUID TRANSFER (MISCELLANEOUS) ×1 IMPLANT
SUT MNCRL AB 4-0 PS2 18 (SUTURE) ×1 IMPLANT
SYS BAG RETRIEVAL 10MM (BASKET) ×1
SYSTEM BAG RETRIEVAL 10MM (BASKET) ×1 IMPLANT
TOWEL OR 17X26 10 PK STRL BLUE (TOWEL DISPOSABLE) ×1 IMPLANT
TOWEL OR NON WOVEN STRL DISP B (DISPOSABLE) ×1 IMPLANT
TRAY LAPAROSCOPIC (CUSTOM PROCEDURE TRAY) ×1 IMPLANT
TROCAR 11X100 Z THREAD (TROCAR) IMPLANT
TROCAR BALLN 12MMX100 BLUNT (TROCAR) ×1 IMPLANT
TROCAR Z-THREAD OPTICAL 5X100M (TROCAR) ×1 IMPLANT

## 2023-12-10 NOTE — Progress Notes (Signed)
PROGRESS NOTE    Derrick [name]  ZOX:[id number] DOB: 12-05-1941 DOA: 12/08/2023 PCP: Excell Seltzer, MD   Brief Narrative:  Patient is an 82 year old Caucasian male with a past medical history significant for abdominal to CAD status post CABG and NSTEMI, history of chronic diastolic heart failure with preserved ejection fraction, mitral valve prolapse, pulmonary sarcoidosis, BPH, GERD, hypertension, hyperlipidemia, OSA on CPAP, permanent atrial fibrillation on anticoagulation as well as other comorbidities presented to the ED for evaluation of epigastric right upper quadrant pain.  He has had abdominal pain for about a week that has been intermittent and described the pain as sharp in the right upper quadrant and epigastric that sometimes radiates to his right side of his back.  He had this colicky pain for 10 the last week and it came on while he is sleeping at night.  Each episode of progressively worse and lasted longer than the 1 before.  He discussed this with the GI doctor who ordered an abdominal ultrasound to further evaluate.  The day prior to admission he ended up eating ham and white bread around 7 PM and woke up 3 AM with significant pain so he went to his ultrasound appointment prior to going to the [facility].  Was found to have cholelithiasis and abnormal LFTs and hyperbilirubinemia and was admitted for symptomatic cholelithiasis.  General surgery was consulted and they are planning on taking the patient to the OR in the morning as his Eliquis is being washed out.  He has been placed on a heparin drip now  Currently he is n.p.o. for surgical intervention today and had some abdominal discomfort last night.  Assessment and Plan:  Abdominal pain, nausea, vomiting with associated symptomatic cholelithiasis and hyperbilirubinemia and abnormal LFTs as below -Patient presented with 1 week of intermittent colicky pain that progressed over the last few days associate with nausea and  vomiting and was found to have cholelithiasis on imaging without signs of acute cholecystitis or biliary dilatation -LFTs and bilirubin trend as below -On admission patient was febrile at 100.5 and mildly tachycardic with a pulse rate of 101 -Ultrasound was done and showed cholelithiasis but no pericholecystic fluid or gallbladder wall thickening -Lipase level was normal -Normal WBC count but has been spiking fevers yesterday indicating early signs of developing cholecystitis -WBC trend: Recent Labs  Lab 12/08/23 1038 12/09/23 0526 12/10/23 0625  WBC 6.7 6.6 5.0  -General Surgery was consulted and evaluated and his Eliquis and anticoagulation has been held and switched to heparin drip.  Last dose was yesterday morning -Continue clear liquid diet and continue with antiemetics with IV Zofran for nausea or vomiting and IV Dilaudid for pain -General surgery PA is discussing with the general surgeon about if MRCP is needed given that the patient has a permanent pacemaker and he would need to go to comfort that versus just repeating labs in the a.m. with plans for IOC at the time of laparoscopic cholecystectomy. -Continue To monitor trend CBC, CMP and fever curve -Currently is NPO for surgical intervention today  Persistent A-fib -Patient on Eliquis 5 mg twice daily.  And his last dose was yesterday morning HR stable in the 60s with ventricularly paced rhythm. -Continue to monitor on telemetry -Hold Eliquis and transition to heparin drip but will hold for Surgical Intervention -Trend and replete electrolytes   Chronic HFpEF Aortic stenosis Mitral valve prolapse -Last TTE on 11/14 showed EF 55 to 60%, mildly elevated PASP, severely dilated LA, moderately dilated RA,  moderate mitral valve regurg and moderate to severe aortic valve stenosis.  Patient euvolemic on exam. -Continue Entresto -Hold his Maxzide for now   Essential HTN -Continue with Entresto and hold Maxzide -Continue monitor  blood pressures per protocol -Last blood pressure reading was 124/60  Glaucoma  -Continue with Bimatoprost 1 drop in both eyes nightly as well as Dorzolamide-Timolol 1 drop in both eyes twice daily  Hyponatremia -Will discontinue his Maxzide for now and continue to monitor sodiums -Na+ Trend: Recent Labs  Lab 12/08/23 1038 12/09/23 0526 12/10/23 0625  NA 140 133* 130*  -Repeat CMP in the AM   CAD Hx of NSTEMI HLD -Continue Rosuvastatin 20 mg po qHS, Entresto 24-26 but will hold Triameterne-Hydrochlorothiazide in anticipation for Surgery    COPD with associated lung sarcoidosis -Continue with albuterol neb 3 mL every 6 as needed for wheezing or shortness of breath and resume fluticasone 2 sprays each nare twice daily as needed for allergies -Resume home inhalers and if necessary will need to substitute Arnuity Ellipta with budesonide; currently takes the fluticasone furoate as needed  BPH -Continue Finasteride 5 mg po Daily and Tadalafil 5 mg po qHS   OSA on CPAP -CPAP at bedtime   Abnormal LFTs, improving Hyperbilirubinemia -LFT and Bilirubin Trend: Recent Labs  Lab 12/08/23 1038 12/09/23 0526 12/10/23 0625  AST 219* 142* 59*  ALT 88* 111* 73*  BILITOT 1.5* 2.6* 2.1*  -In the setting of Above -Continue to Monitor and Trend Hepatic Fxn carefully and repeat CMP in the AM  Normocytic Anemia -Hgb/Hct Trend: Recent Labs  Lab 12/08/23 1038 12/09/23 0526 12/10/23 0625  HGB 12.7* 12.2* 12.7*  HCT 38.3* 37.1* 38.6*  MCV 94.1 95.4 95.3  -Check Anemia Panel in the AM -Continue to Monitor and Trend and repeat CBC in the AM  Thrombocytopenia -Platelet Trend: Recent Labs  Lab 12/08/23 1038 12/09/23 0526 12/10/23 0625  PLT 141* 129* 125*  -Continue to Monitor for S/Sx of Bleeding  -Continue to Monitor and Trend and Repeat CBC in the AM  GERD/GI Prophylaxis -C/w PPI with Pantoprazole 40 mg po Daily    DVT prophylaxis: Anticoagulated with heparin drip    Code  Status: Full Code Family Communication: Discussed with his wife at bedside  Disposition Plan:  Level of care: Telemetry Status is: Inpatient Remains inpatient appropriate because: He has further clinical improvement and clearance by general surgery  Consultants:  General Surgery  Procedures:  As delineated as above  Antimicrobials:  Anti-infectives (From admission, onward)    Start     Dose/Rate Route Frequency Ordered Stop   12/10/23 1142  ceFAZolin (ANCEF) 2-4 GM/100ML-% IVPB       Note to Pharmacy: Mortimer Fries A: cabinet override      12/10/23 1142 12/10/23 2359        Subjective: Seen and examined at bedside and he had some abdominal discomfort last night and complained that it was an 8 out of 10 in severity.  This morning it is a 4 out of 10 in severity.  States he feels hungry.  No lightheadedness or dizziness. Awaiting to go down for Surgery.   Objective: Vitals:   12/09/23 2050 12/10/23 0155 12/10/23 0500 12/10/23 1115  BP:  128/64 116/69 124/60  Pulse: 62  60   Resp: 17  18 16   Temp:   99.2 F (37.3 C) 98.7 F (37.1 C)  TempSrc:   Oral   SpO2: 97%  99% 98%    Intake/Output Summary (Last 24  hours) at 12/10/2023 1210 Last data filed at 12/10/2023 1610 Gross per 24 hour  Intake 64.71 ml  Output 680 ml  Net -615.29 ml   There were no vitals filed for this visit.  Examination: Physical Exam:  Constitutional: WN/WD elderly Caucasian male in no acute distress but does appear little uncomfortable Respiratory: Diminished to auscultation bilaterally, no wheezing, rales, rhonchi or crackles. Normal respiratory effort and patient is not tachypenic. No accessory muscle use.  Unlabored breathing and not wearing supplemental oxygen via nasal cannula Cardiovascular: RRR, no murmurs / rubs / gallops. S1 and S2 auscultated. No extremity edema.  Abdomen: Soft, tender to palpate and mildly distended. No masses palpated. No appreciable hepatosplenomegaly. Bowel  sounds positive.  GU: Deferred. Musculoskeletal: No clubbing / cyanosis of digits/nails. No joint deformity upper and lower extremities. Good ROM, no contractures. Normal strength and muscle tone.  Skin: No rashes, lesions, ulcers on medicine evaluation. No induration; Warm and dry.  Neurologic: CN 2-12 grossly intact with no focal deficits. Romberg sign and cerebellar reflexes not assessed.  Psychiatric: Normal judgment and insight. Alert and oriented x 3. Normal mood and appropriate affect.   Data Reviewed: I have personally reviewed following labs and imaging studies  CBC: Recent Labs  Lab 12/08/23 1038 12/09/23 0526 12/10/23 0625  WBC 6.7 6.6 5.0  NEUTROABS 5.5  --  3.9  HGB 12.7* 12.2* 12.7*  HCT 38.3* 37.1* 38.6*  MCV 94.1 95.4 95.3  PLT 141* 129* 125*   Basic Metabolic Panel: Recent Labs  Lab 12/08/23 1038 12/09/23 0526 12/10/23 0625  NA 140 133* 130*  K 4.0 3.7 3.9  CL 105 100 100  CO2 28 24 24   GLUCOSE 103* 95 90  BUN 21 21 18   CREATININE 1.14 1.14 1.05  CALCIUM 10.0 9.5 9.8  MG  --  1.7 1.7  PHOS  --  2.6 2.2*   GFR: Estimated Creatinine Clearance: 56.1 mL/min (by C-G formula based on SCr of 1.05 mg/dL). Liver Function Tests: Recent Labs  Lab 12/08/23 1038 12/09/23 0526 12/10/23 0625  AST 219* 142* 59*  ALT 88* 111* 73*  ALKPHOS 112 117 110  BILITOT 1.5* 2.6* 2.1*  PROT 7.1 6.8 6.6  ALBUMIN 4.3 3.9 3.6   Recent Labs  Lab 12/08/23 1038 12/09/23 0526  LIPASE 32 26   No results for input(s): "AMMONIA" in the last 168 hours. Coagulation Profile: No results for input(s): "INR", "PROTIME" in the last 168 hours. Cardiac Enzymes: No results for input(s): "CKTOTAL", "CKMB", "CKMBINDEX", "TROPONINI" in the last 168 hours. BNP (last 3 results) No results for input(s): "PROBNP" in the last 8760 hours. HbA1C: No results for input(s): "HGBA1C" in the last 72 hours. CBG: No results for input(s): "GLUCAP" in the last 168 hours. Lipid Profile: No  results for input(s): "CHOL", "HDL", "LDLCALC", "TRIG", "CHOLHDL", "LDLDIRECT" in the last 72 hours. Thyroid Function Tests: No results for input(s): "TSH", "T4TOTAL", "FREET4", "T3FREE", "THYROIDAB" in the last 72 hours. Anemia Panel: No results for input(s): "VITAMINB12", "FOLATE", "FERRITIN", "TIBC", "IRON", "RETICCTPCT" in the last 72 hours. Sepsis Labs: No results for input(s): "PROCALCITON", "LATICACIDVEN" in the last 168 hours.  No results found for this or any previous visit (from the past 240 hours).   Radiology Studies: No results found.  Scheduled Meds:  [MAR Hold] cholecalciferol  1,000 Units Oral Daily   [MAR Hold] dorzolamide-timolol  1 drop Both Eyes BID   [MAR Hold] finasteride  5 mg Oral QPC breakfast   [MAR Hold] latanoprost  1  drop Both Eyes QHS   [MAR Hold] multivitamin with minerals  1 tablet Oral Daily   [MAR Hold] pantoprazole  40 mg Oral Daily   [MAR Hold] rosuvastatin  20 mg Oral QHS   [MAR Hold] sacubitril-valsartan  1 tablet Oral BID   [MAR Hold] tadalafil  5 mg Oral QHS   Continuous Infusions:  acetaminophen     ceFAZolin      LOS: 1 day   Marguerita Merles, DO [facility] Available via Epic secure chat 7am-7pm After these hours, please refer to coverage provider listed on [email or web address] 12/10/2023, 12:10 PM

## 2023-12-10 NOTE — Anesthesia Postprocedure Evaluation (Signed)
Anesthesia Post Note  Patient: Lexie Swenson  Procedure(s) Performed: LAPAROSCOPIC CHOLECYSTECTOMY WITH INTRAOPERATIVE CHOLANGIOGRAM     Patient location during evaluation: PACU Anesthesia Type: General Level of consciousness: awake Pain management: pain level controlled Vital Signs Assessment: post-procedure vital signs reviewed and stable Respiratory status: spontaneous breathing, nonlabored ventilation and respiratory function stable Cardiovascular status: blood pressure returned to baseline and stable Postop Assessment: no apparent nausea or vomiting Anesthetic complications: no   No notable events documented.  Last Vitals:  Vitals:   12/10/23 1416 12/10/23 1417  BP: 137/68   Pulse: 60 60  Resp: 18   Temp:  36.7 C  SpO2: (!) 89% 95%    Last Pain:  Vitals:   12/10/23 1345  TempSrc:   PainSc: 2                  Lillion Elbert P Ingram Onnen

## 2023-12-10 NOTE — Anesthesia Procedure Notes (Signed)
Procedure Name: Intubation Date/Time: 12/10/2023 12:10 PM  Performed by: Vanessa Hills and Dales, CRNAPre-anesthesia Checklist: Patient identified, Emergency Drugs available, Suction available and Patient being monitored Patient Re-evaluated:Patient Re-evaluated prior to induction Oxygen Delivery Method: Circle system utilized Preoxygenation: Pre-oxygenation with 100% oxygen Induction Type: IV induction Ventilation: Mask ventilation without difficulty Laryngoscope Size: 2 and Miller Grade View: Grade I Tube type: Oral Tube size: 8.0 mm Number of attempts: 1 Airway Equipment and Method: Stylet Placement Confirmation: ETT inserted through vocal cords under direct vision, positive ETCO2 and breath sounds checked- equal and bilateral Secured at: 22 cm Tube secured with: Tape Dental Injury: Teeth and Oropharynx as per pre-operative assessment

## 2023-12-10 NOTE — Anesthesia Preprocedure Evaluation (Addendum)
Anesthesia Evaluation  Patient identified by MRN, date of birth, ID band Patient awake    Reviewed: Allergy & Precautions, NPO status , Patient's Chart, lab work & pertinent test results  Airway Mallampati: III  TM Distance: >3 FB Neck ROM: Full    Dental no notable dental hx.    Pulmonary sleep apnea and Continuous Positive Airway Pressure Ventilation , COPD,  COPD inhaler PULMONARY SARCOIDOSIS   Pulmonary exam normal breath sounds clear to auscultation       Cardiovascular hypertension, Pt. on medications + CAD, + Past MI, + Cardiac Stents, + CABG and +CHF  + dysrhythmias Atrial Fibrillation + pacemaker + Valvular Problems/Murmurs MVP and AS  Rhythm:Regular Rate:Normal + Systolic murmurs ECHO: 1. Left ventricular ejection fraction, by estimation, is 55 to 60%. The  left ventricle has normal function. The left ventricle has no regional  wall motion abnormalities. Left ventricular diastolic parameters are  indeterminate. The average left  ventricular global longitudinal strain is 15.0 %. The global longitudinal  strain is abnormal.   2. Right ventricular systolic function is mildly reduced. The right  ventricular size is normal. There is mildly elevated pulmonary artery  systolic pressure. The estimated right ventricular systolic pressure is  39.1 mmHg.   3. Left atrial size was severely dilated.   4. Right atrial size was moderately dilated.   5. The mitral valve is degenerative. Moderate mitral valve regurgitation.  No evidence of mitral stenosis.   6. The aortic valve is tricuspid. There is severe calcifcation of the  aortic valve. Aortic valve regurgitation is trivial. Moderate to severe  aortic valve stenosis. Aortic valve area, by VTI measures 0.97 cm. Aortic  valve mean gradient measures 25.0  mmHg. Possible paradoxical low flow/low gradient severe aortic stenosis  though borderline (calculated AVA 1.0 cm^2 but  dimensionless index 0.34).   7. Aortic dilatation noted. There is mild dilatation of the ascending  aorta, measuring 42 mm.   8. The inferior vena cava is dilated in size with >50% respiratory  variability, suggesting right atrial pressure of 8 mmHg.     Neuro/Psych  Neuromuscular disease  negative psych ROS   GI/Hepatic Neg liver ROS,GERD  Medicated and Controlled,,  Endo/Other  negative endocrine ROS    Renal/GU negative Renal ROS     Musculoskeletal  (+) Arthritis ,    Abdominal   Peds  Hematology  (+) Blood dyscrasia (Eliquis), anemia   Anesthesia Other Findings gallstones  Reproductive/Obstetrics                             Anesthesia Physical Anesthesia Plan  ASA: 4  Anesthesia Plan: General   Post-op Pain Management:    Induction: Intravenous  PONV Risk Score and Plan: 3 and Ondansetron, Dexamethasone and Treatment may vary due to age or medical condition  Airway Management Planned: Oral ETT  Additional Equipment:   Intra-op Plan:   Post-operative Plan: Extubation in OR  Informed Consent: I have reviewed the patients History and Physical, chart, labs and discussed the procedure including the risks, benefits and alternatives for the proposed anesthesia with the patient or authorized representative who has indicated his/her understanding and acceptance.     Dental advisory given  Plan Discussed with: CRNA  Anesthesia Plan Comments:         Anesthesia Quick Evaluation

## 2023-12-10 NOTE — Plan of Care (Signed)

## 2023-12-10 NOTE — Progress Notes (Signed)
Mobility Specialist - Progress Note   12/10/23 1006  Mobility  Activity Ambulated with assistance in hallway  Level of Assistance Independent after set-up  Assistive Device None  Distance Ambulated (ft) 500 ft  Range of Motion/Exercises Active  Activity Response Tolerated well  Mobility Referral Yes  Mobility visit 1 Mobility  Mobility Specialist Start Time (ACUTE ONLY) 0913  Mobility Specialist Stop Time (ACUTE ONLY) 0925  Mobility Specialist Time Calculation (min) (ACUTE ONLY) 12 min   Received in bed and agreed to mobility. Had no issues throughout session. Returned to chair with all needs met.  Marilynne Halsted Mobility Specialist

## 2023-12-10 NOTE — Progress Notes (Signed)
   12/10/23 2118  BiPAP/CPAP/SIPAP  BiPAP/CPAP/SIPAP Pt Type Adult  BiPAP/CPAP/SIPAP Resmed (from home)  Mask Type Nasal pillows  FiO2 (%) 21 %  Heater Temperature  (sterile water supplied)  Patient Home Equipment Yes  Auto Titrate Yes (5-15)  Safety Check Completed by RT for Home Unit Yes, no issues noted  BiPAP/CPAP /SiPAP Vitals  Pulse Rate 66  Resp 15  SpO2 97 %  Bilateral Breath Sounds Clear;Diminished  MEWS Score/Color  MEWS Score 0  MEWS Score Color Green

## 2023-12-10 NOTE — Transfer of Care (Signed)
Immediate Anesthesia Transfer of Care Note  Patient: Derrick [name]  Procedure(s) Performed: LAPAROSCOPIC CHOLECYSTECTOMY WITH INTRAOPERATIVE CHOLANGIOGRAM  Patient Location: PACU  Anesthesia Type:General  Level of Consciousness: awake and patient cooperative  Airway & Oxygen Therapy: Patient Spontanous Breathing and Patient connected to face mask  Post-op Assessment: Report given to RN and Post -op Vital signs reviewed and stable  Post vital signs: Reviewed and stable  Last Vitals:  Vitals Value Taken Time  BP 181/77 12/10/23 1306  Temp    Pulse    Resp 20 12/10/23 1307  SpO2    Vitals shown include unfiled device data.  Last Pain:  Vitals:   12/10/23 0859  TempSrc:   PainSc: 3       Patients Stated Pain Goal: 0 (12/09/23 0510)  Complications: No notable events documented.

## 2023-12-10 NOTE — Op Note (Signed)
12/10/2023  12:58 PM  PATIENT:  Derrick [name]  82 y.o. male  PRE-OPERATIVE DIAGNOSIS:  symptomatic cholelithiasis  POST-OPERATIVE DIAGNOSIS:  symptomatic cholelithiasis  PROCEDURE:  Procedure(s): LAPAROSCOPIC CHOLECYSTECTOMY WITH INTRAOPERATIVE CHOLANGIOGRAM (N/A)  SURGEON:  Surgeons and Role:    * Griselda Miner, MD - Primary  PHYSICIAN ASSISTANT:   ASSISTANTS: none   ANESTHESIA:   local and general  EBL:  20 mL   BLOOD ADMINISTERED:none  DRAINS: none   LOCAL MEDICATIONS USED:  MARCAINE     SPECIMEN:  Source of Specimen:  gallbladder  DISPOSITION OF SPECIMEN:  PATHOLOGY  COUNTS:  YES  TOURNIQUET:  * No tourniquets in log *  DICTATION: .Dragon Dictation  After informed consent was obtained the patient was brought to the operating room and placed in the supine position on the operating table.  After adequate induction of general anesthesia the patient's abdomen was prepped with ChloraPrep, allowed to dry, and draped in the usual sterile manner.  An appropriate timeout was performed.  Given his previous surgery I elected try to access the abdominal cavity through an Optiview port.  The site was chosen in the right upper quadrant and infiltrated with quarter percent Marcaine.  A small stab incision was made with a 15 blade knife.  A 5 mm Optiview port and camera were placed bluntly through the abdominal wall under direct vision until access was gained to the abdominal cavity.  The abdomen was then insufflated with carbon oxide without difficulty.  2 more 5 mm ports were placed under direct vision in the right mid abdomen avoiding the adhesions to the abdominal wall.  A 10 mm port was then placed under direct vision through the epigastric area after infiltrating it with quarter percent Marcaine.  A blunt grasper was then placed through the lateralmost 5 mm port and used to grasp the dome of the gallbladder and elevated anteriorly and superiorly.  Another blunt grasper was  placed through the other 5 mm port and used to retract on the body and neck of the gallbladder.  A dissector was placed through the epigastric port and using the electrocautery the peritoneal reflection at the gallbladder neck was opened.  Blunt dissection was then carried out in this area until the gallbladder neck cystic duct junction was readily identified and a good critical window was created.  A single clip was placed on the gallbladder neck.  A 14-gauge Angiocath was placed percutaneously through the anterior abdominal wall under direct vision.  A Reddick cholangiogram catheter was placed through the Angiocath and flushed.  A small ductotomy was made with the laparoscopic scissors just below the clip.  The catheter was then placed in the cystic duct and held in place with a clip.  A cholangiogram was obtained that showed no filling defects and good emptying into the duodenum.  The patient had a long small cystic duct.  The anchoring clip and catheter were removed from the patient.  2 clips were placed proximally on the cystic duct and the duct was divided between the 2 sets of clips.  Posterior to this the cystic artery was identified and also dissected circumferentially until a good window was created.  2 clips were placed proximally 1 distally on the artery and the artery was divided between the 2.  Next a hook catheter was used to separate the gallbladder from the liver bed using the electrocautery.  Prior to completely detaching the gallbladder from the liver bed the liver bed was inspected and  a couple small bleeding points were coagulated with the electrocautery until the area was completely hemostatic.  The gallbladder was then detached the rest away from the liver without difficulty.  A laparoscopic bag was placed through the epigastric port.  The gallbladder was placed within the bag and the bag was sealed.  The gallbladder and sac were then removed through the epigastric port without difficulty.   The port was replaced and the abdomen was inspected.  The right upper quadrant was irrigated with copious amounts of saline.  Everything looked hemostatic.  No other abnormalities were noted on general inspection of the abdomen.  The epigastric port was removed and the fascial defect was closed with a 0 Vicryl tie and passer.  The rest of the ports were removed under direct vision without difficulty.  The skin incisions were all closed with interrupted 4-0 Monocryl subcuticular stitches.  Dermabond dressings were applied.  The patient tolerated the procedure well.  At the end of the case all needle sponge and instrument counts were correct.  The patient was then awakened and taken to recovery in stable condition.  PLAN OF CARE: Admit to inpatient   PATIENT DISPOSITION:  PACU - hemodynamically stable.   Delay start of Pharmacological VTE agent (>24hrs) due to surgical blood loss or risk of bleeding: no

## 2023-12-10 NOTE — Progress Notes (Signed)
Subjective/Chief Complaint: Still complains of upper abdominal pain   Objective: Vital signs in last 24 hours: Temp:  [98.9 F (37.2 C)-99.9 F (37.7 C)] 99.2 F (37.3 C) (12/14 0500) Pulse Rate:  [60-62] 60 (12/14 0500) Resp:  [17-18] 18 (12/14 0500) BP: (116-129)/(60-69) 116/69 (12/14 0500) SpO2:  [96 %-100 %] 99 % (12/14 0500) FiO2 (%):  [21 %] 21 % (12/13 2050) Last BM Date : 12/08/23  Intake/Output from previous day: 12/13 0701 - 12/14 0700 In: 184.7 [P.O.:180; I.V.:4.7] Out: 680 [Urine:680] Intake/Output this shift: No intake/output data recorded.  General appearance: alert and cooperative Resp: clear to auscultation bilaterally Cardio: regular rate and rhythm GI: Soft with mild epigastric pain  Lab Results:  Recent Labs    12/09/23 0526 12/10/23 0625  WBC 6.6 5.0  HGB 12.2* 12.7*  HCT 37.1* 38.6*  PLT 129* 125*   BMET Recent Labs    12/09/23 0526 12/10/23 0625  NA 133* 130*  K 3.7 3.9  CL 100 100  CO2 24 24  GLUCOSE 95 90  BUN 21 18  CREATININE 1.14 1.05  CALCIUM 9.5 9.8   PT/INR No results for input(s): "LABPROT", "INR" in the last 72 hours. ABG No results for input(s): "PHART", "HCO3" in the last 72 hours.  Invalid input(s): "PCO2", "PO2"  Studies/Results: US Abdomen Complete Result Date: 12/08/2023 CLINICAL DATA:  Abdominal pain for 2 weeks EXAM: ABDOMEN ULTRASOUND COMPLETE COMPARISON:  CT chest, abdomen and pelvis from 05/24/2023 FINDINGS: Gallbladder: Small amount of layering sludge and suspected tiny nonshadowing gallstones (several small calcified gallstones were present within the gallbladder on 05/24/2023 CT abdomen study). No definite gallbladder wall thickening. No pericholecystic fluid. Sonographic Eulah Pont sign is present per the technologist. A 7 mm gallbladder polyp is identified. Common bile duct: Not visualized. No appreciable intrahepatic biliary ductal dilatation. Liver: No focal lesion identified. Within normal limits in  parenchymal echogenicity. Portal vein is patent on color Doppler imaging with normal direction of blood flow towards the liver. IVC: No abnormality visualized. Pancreas: Visualized portion unremarkable. Spleen: Size and appearance within normal limits. Right Kidney: Length: 11.0 cm. Echogenicity within normal limits. No mass or hydronephrosis visualized. Left Kidney: Length: 10.3 cm. Echogenicity within normal limits. No hydronephrosis. Exophytic simple 1.9 x 1.7 x 1.9 cm lower left renal cyst. Abdominal aorta: No aneurysm visualized. Other findings: None. IMPRESSION: 1. Small amount of layering sludge and suspected tiny nonshadowing gallstones (several small calcified gallstones were present within the gallbladder on 05/24/2023 CT abdomen study). Sonographic Eulah Pont sign is present per the technologist, but there is no definite gallbladder wall thickening or pericholecystic fluid. Consider further evaluation with nuclear medicine hepatobiliary scan as clinically warranted. 2. A 7 mm gallbladder polyp. Recommend follow-up right upper quadrant ultrasound in 6 months. 3. Common bile duct not visualized. No appreciable intrahepatic biliary ductal dilatation. 4. Simple 1.9 cm lower left renal cyst, for which no follow-up imaging is recommended. Electronically Signed   By: Delbert Phenix M.D.   On: 12/08/2023 10:46    Anti-infectives: Anti-infectives (From admission, onward)    None       Assessment/Plan: s/p * No surgery found * The patient appears to have symptomatic cholelithiasis.  His liver functions are improving today.  He is now been off Eliquis for 2 days.  He is in his window for surgery.  I think he would benefit from having his gallbladder removed.  He would also like to have this done.  I have discussed with him in detail the  risks and benefits of the operation as well as some of the technical aspects including the risk of common bile duct injury and he understands and wishes to proceed.  We will  plan for surgery later today.  LOS: 1 day    Chevis Pretty III 12/10/2023

## 2023-12-10 NOTE — Plan of Care (Signed)

## 2023-12-11 ENCOUNTER — Encounter: Payer: Self-pay | Admitting: Family Medicine

## 2023-12-11 ENCOUNTER — Encounter (HOSPITAL_COMMUNITY): Payer: Self-pay | Admitting: General Surgery

## 2023-12-11 ENCOUNTER — Inpatient Hospital Stay (HOSPITAL_COMMUNITY): Payer: Medicare Other

## 2023-12-11 DIAGNOSIS — R7401 Elevation of levels of liver transaminase levels: Secondary | ICD-10-CM | POA: Diagnosis not present

## 2023-12-11 DIAGNOSIS — K8020 Calculus of gallbladder without cholecystitis without obstruction: Secondary | ICD-10-CM | POA: Diagnosis not present

## 2023-12-11 DIAGNOSIS — R1011 Right upper quadrant pain: Secondary | ICD-10-CM | POA: Diagnosis not present

## 2023-12-11 LAB — COMPREHENSIVE METABOLIC PANEL
ALT: 56 U/L — ABNORMAL HIGH (ref 0–44)
ALT: 44 U/L (ref 0–44)
AST: 45 U/L — ABNORMAL HIGH (ref 15–41)
AST: 37 U/L (ref 15–41)
Albumin: 3.7 g/dL (ref 3.5–5.0)
Albumin: 3.4 g/dL — ABNORMAL LOW (ref 3.5–5.0)
Alkaline Phosphatase: 107 U/L (ref 38–126)
Alkaline Phosphatase: 94 U/L (ref 38–126)
Anion gap: 9 (ref 5–15)
Anion gap: 9 (ref 5–15)
BUN: 33 mg/dL — ABNORMAL HIGH (ref 8–23)
BUN: 38 mg/dL — ABNORMAL HIGH (ref 8–23)
CO2: 23 mmol/L (ref 22–32)
CO2: 21 mmol/L — ABNORMAL LOW (ref 22–32)
Calcium: 10.4 mg/dL — ABNORMAL HIGH (ref 8.9–10.3)
Calcium: 10 mg/dL (ref 8.9–10.3)
Chloride: 98 mmol/L (ref 98–111)
Chloride: 100 mmol/L (ref 98–111)
Creatinine, Ser: 1.57 mg/dL — ABNORMAL HIGH (ref 0.61–1.24)
Creatinine, Ser: 1.35 mg/dL — ABNORMAL HIGH (ref 0.61–1.24)
GFR, Estimated: 44 mL/min — ABNORMAL LOW (ref >60)
GFR, Estimated: 53 mL/min — ABNORMAL LOW (ref >60)
Glucose, Bld: 143 mg/dL — ABNORMAL HIGH (ref 70–99)
Glucose, Bld: 134 mg/dL — ABNORMAL HIGH (ref 70–99)
Potassium: 4.5 mmol/L (ref 3.5–5.1)
Potassium: 4.4 mmol/L (ref 3.5–5.1)
Sodium: 130 mmol/L — ABNORMAL LOW (ref 135–145)
Sodium: 130 mmol/L — ABNORMAL LOW (ref 135–145)
Total Bilirubin: 1.2 mg/dL — ABNORMAL HIGH (ref <1.2)
Total Bilirubin: 0.9 mg/dL (ref <1.2)
Total Protein: 7 g/dL (ref 6.5–8.1)
Total Protein: 6.6 g/dL (ref 6.5–8.1)

## 2023-12-11 LAB — RETICULOCYTES
Immature Retic Fract: 11.1 % (ref 2.3–15.9)
RBC.: 4.31 MIL/uL (ref 4.22–5.81)
Retic Count, Absolute: 62.9 10*3/uL (ref 19.0–186.0)
Retic Ct Pct: 1.5 % (ref 0.4–3.1)

## 2023-12-11 LAB — CBC WITH DIFFERENTIAL/PLATELET
Abs Immature Granulocytes: 0.03 10*3/uL (ref 0.00–0.07)
Basophils Absolute: 0 10*3/uL (ref 0.0–0.1)
Basophils Relative: 0 %
Eosinophils Absolute: 0 10*3/uL (ref 0.0–0.5)
Eosinophils Relative: 0 %
HCT: 40 % (ref 39.0–52.0)
Hemoglobin: 13.5 g/dL (ref 13.0–17.0)
Immature Granulocytes: 1 %
Lymphocytes Relative: 11 %
Lymphs Abs: 0.7 10*3/uL (ref 0.7–4.0)
MCH: 31.8 pg (ref 26.0–34.0)
MCHC: 33.8 g/dL (ref 30.0–36.0)
MCV: 94.3 fL (ref 80.0–100.0)
Monocytes Absolute: 0.6 10*3/uL (ref 0.1–1.0)
Monocytes Relative: 10 %
Neutro Abs: 4.7 10*3/uL (ref 1.7–7.7)
Neutrophils Relative %: 78 %
Platelets: 147 10*3/uL — ABNORMAL LOW (ref 150–400)
RBC: 4.24 MIL/uL (ref 4.22–5.81)
RDW: 13.1 % (ref 11.5–15.5)
WBC: 6 10*3/uL (ref 4.0–10.5)
nRBC: 0 % (ref 0.0–0.2)

## 2023-12-11 LAB — IRON AND TIBC
Iron: 19 ug/dL — ABNORMAL LOW (ref 45–182)
Saturation Ratios: 6 % — ABNORMAL LOW (ref 17.9–39.5)
TIBC: 346 ug/dL (ref 250–450)
UIBC: 327 ug/dL

## 2023-12-11 LAB — URINALYSIS, COMPLETE (UACMP) WITH MICROSCOPIC
Bacteria, UA: NONE SEEN
Bilirubin Urine: NEGATIVE
Glucose, UA: NEGATIVE mg/dL
Hgb urine dipstick: NEGATIVE
Ketones, ur: NEGATIVE mg/dL
Leukocytes,Ua: NEGATIVE
Nitrite: NEGATIVE
Protein, ur: NEGATIVE mg/dL
Specific Gravity, Urine: 1.016 (ref 1.005–1.030)
pH: 5 (ref 5.0–8.0)

## 2023-12-11 LAB — MAGNESIUM: Magnesium: 2.2 mg/dL (ref 1.7–2.4)

## 2023-12-11 LAB — OSMOLALITY, URINE: Osmolality, Ur: 550 mosm/kg (ref 300–900)

## 2023-12-11 LAB — FOLATE: Folate: 19.2 ng/mL (ref >5.9)

## 2023-12-11 LAB — CREATININE, URINE, RANDOM: Creatinine, Urine: 125 mg/dL

## 2023-12-11 LAB — SODIUM, URINE, RANDOM: Sodium, Ur: 48 mmol/L

## 2023-12-11 LAB — FERRITIN: Ferritin: 143 ng/mL (ref 24–336)

## 2023-12-11 LAB — VITAMIN B12: Vitamin B-12: 574 pg/mL (ref 180–914)

## 2023-12-11 LAB — PHOSPHORUS: Phosphorus: 4.3 mg/dL (ref 2.5–4.6)

## 2023-12-11 MED ORDER — SODIUM CHLORIDE 0.9 % IV BOLUS
250.0000 mL | Freq: Once | INTRAVENOUS | Status: AC
  Administered 2023-12-11: 250 mL via INTRAVENOUS

## 2023-12-11 MED ORDER — APIXABAN 5 MG PO TABS
5.0000 mg | ORAL_TABLET | Freq: Two times a day (BID) | ORAL | Status: DC
  Administered 2023-12-11: 5 mg via ORAL
  Filled 2023-12-11: qty 1

## 2023-12-11 MED ORDER — SODIUM CHLORIDE 0.9 % IV BOLUS: 500.0000 mL | Freq: Once | INTRAVENOUS | Status: DC

## 2023-12-11 MED ORDER — SODIUM CHLORIDE 0.9 % IV SOLN: INTRAVENOUS | Status: DC

## 2023-12-11 MED ORDER — SENNOSIDES-DOCUSATE SODIUM 8.6-50 MG PO TABS: 1.0000 | ORAL_TABLET | Freq: Every evening | ORAL | 0 refills | Status: DC | PRN

## 2023-12-11 MED ORDER — ONDANSETRON HCL 4 MG PO TABS: 4.0000 mg | ORAL_TABLET | Freq: Four times a day (QID) | ORAL | 0 refills | Status: DC | PRN

## 2023-12-11 MED ORDER — OXYCODONE HCL 5 MG PO TABS: 5.0000 mg | ORAL_TABLET | Freq: Four times a day (QID) | ORAL | 0 refills | Status: DC | PRN

## 2023-12-11 MED ORDER — ACETAMINOPHEN 325 MG PO TABS: 650.0000 mg | ORAL_TABLET | Freq: Four times a day (QID) | ORAL | 0 refills | Status: DC | PRN

## 2023-12-11 NOTE — Discharge Summary (Signed)
Physician Discharge Summary   Patient: Derrick [name] MRN: [id number] DOB: 1941/10/19  Admit date:     12/08/2023  Discharge date: 12/11/23  Discharge Physician: Marguerita Merles, DO   PCP: Excell Seltzer, MD   Recommendations at discharge:   Follow-up with PCP within 1 to 2 weeks repeat CBC, CMP, mag, Phos within 1 week and continue to hold Maxzide and have PCP resume if necessary after renal function is improved Follow-up with General Surgery within 1 to 2 weeks Follow-up with Cardiology in the outpatient setting  Discharge Diagnoses: Principal Problem:   Symptomatic cholelithiasis Active Problems:   RUQ abdominal pain   Transaminitis  Resolved Problems:   * No resolved hospital problems. *  Hospital Course: Patient is an 82 year old Caucasian male with a past medical history significant for abdominal to CAD status post CABG and NSTEMI, history of chronic diastolic heart failure with preserved ejection fraction, mitral valve prolapse, pulmonary sarcoidosis, BPH, GERD, hypertension, hyperlipidemia, OSA on CPAP, permanent atrial fibrillation on anticoagulation as well as other comorbidities presented to the ED for evaluation of epigastric right upper quadrant pain.  He has had abdominal pain for about a week that has been intermittent and described the pain as sharp in the right upper quadrant and epigastric that sometimes radiates to his right side of his back.  He had this colicky pain for 10 the last week and it came on while he is sleeping at night.  Each episode of progressively worse and lasted longer than the 1 before.  He discussed this with the GI doctor who ordered an abdominal ultrasound to further evaluate.  The day prior to admission he ended up eating ham and white bread around 7 PM and woke up 3 AM with significant pain so he went to his ultrasound appointment prior to going to the [facility].  Was found to have cholelithiasis and abnormal LFTs and hyperbilirubinemia and was  admitted for symptomatic cholelithiasis.  General surgery was consulted and they are planning on taking the patient to the OR in the morning as his Eliquis is being washed out.  He has been placed on a heparin drip now  Surgical intervention yesterday and is doing fairly well today.  Postoperatively he had an AKI but is starting to improve with fluid hydration.  He was recommended to hold his Maxzide for now and repeat CMP within a week.  He is medically stable for discharge at this time will need to follow-up with PCP, cardiology and general surgery in outpatient setting  Assessment and Plan:  Abdominal pain, nausea, vomiting with associated symptomatic cholelithiasis and hyperbilirubinemia and abnormal LFTs as below -Patient presented with 1 week of intermittent colicky pain that progressed over the last few days associate with nausea and vomiting and was found to have cholelithiasis on imaging without signs of acute cholecystitis or biliary dilatation -LFTs and bilirubin trend as below -On admission patient was febrile at 100.5 and mildly tachycardic with a pulse rate of 101 -Ultrasound was done and showed cholelithiasis but no pericholecystic fluid or gallbladder wall thickening -Lipase level was normal -Normal WBC count but has been spiking fevers yesterday indicating early signs of developing cholecystitis -WBC trend: Recent Labs  Lab 12/08/23 1038 12/09/23 0526 12/10/23 0625 12/11/23 0542  WBC 6.7 6.6 5.0 6.0  -General Surgery was consulted and evaluated and his Eliquis and anticoagulation has been held and switched to heparin drip.  Last dose was yesterday morning -Continue clear liquid diet and continue with antiemetics  with IV Zofran for nausea or vomiting and IV Dilaudid for pain -General surgery PA is discussing with the general surgeon about if MRCP is needed given that the patient has a permanent pacemaker and he would need to go to comfort that versus just repeating labs in the  a.m. with plans for IOC at the time of laparoscopic cholecystectomy. -Continue To monitor trend CBC, CMP and fever curve -Underwent surgical intervention yesterday and is doing fairly well today.  He is cleared from a surgical standpoint and pain regimen has been given to the patient by the surgeon.  Will need to follow-up with PCP, general surgery and cardiology outpatient setting given that he is medically stable at this time  Persistent A-fib -Patient on Eliquis 5 mg twice daily.  And his last dose was yesterday morning HR stable in the 60s with ventricularly paced rhythm. -Continue to monitor on telemetry -Hold Eliquis and transition to heparin drip but will hold for Surgical Intervention -Trend and replete electrolytes   Chronic HFpEF Aortic stenosis Mitral valve prolapse -Last TTE on 11/14 showed EF 55 to 60%, mildly elevated PASP, severely dilated LA, moderately dilated RA, moderate mitral valve regurg and moderate to severe aortic valve stenosis.  Patient euvolemic on exam. -Continue Entresto -Hold his Maxzide for now have PCP resume    Essential HTN -Continue with Entresto and hold Maxzide -Continue monitor blood pressures per protocol -Last blood pressure reading was 124/60  Glaucoma  -Continue with Bimatoprost 1 drop in both eyes nightly as well as Dorzolamide-Timolol 1 drop in both eyes twice daily  Hyponatremia -Will discontinue his Maxzide for now and continue to monitor sodiums -Na+ Trend: Recent Labs  Lab 12/08/23 1038 12/09/23 0526 12/10/23 0625 12/11/23 0542 12/11/23 1501  NA 140 133* 130* 130* 130*  -Repeat CMP in the AM   CAD Hx of NSTEMI HLD -Continue Rosuvastatin 20 mg po qHS, Entresto 24-26 but will hold [place]-Hydrochlorothiazide in anticipation for Surgery    COPD with associated Lung Sarcoidosis -Continue with albuterol neb 3 mL every 6 as needed for wheezing or shortness of breath and resume fluticasone 2 sprays each nare twice daily as  needed for allergies -Resume home inhalers and if necessary will need to substitute Arnuity Ellipta with budesonide; currently takes the fluticasone furoate as needed  BPH -Continue Finasteride 5 mg po Daily and Tadalafil 5 mg po qHS   OSA on CPAP -CPAP at bedtime  AKI -BUN/Cr Trend: Recent Labs  Lab 12/08/23 1038 12/09/23 0526 12/10/23 0625 12/11/23 0542 12/11/23 1501  BUN 21 21 18  33* 38*  CREATININE 1.14 1.14 1.05 1.57* 1.35*  -Avoid Nephrotoxic Medications, Contrast Dyes, Hypotension and Dehydration to Ensure Adequate Renal Perfusion and will need to Renally Adjust Meds -Continue to Monitor and Trend Renal Function carefully and repeat CMP within 1 week   Abnormal LFTs, improved Hyperbilirubinemia, improved  -LFT and Bilirubin Trend: Recent Labs  Lab 12/08/23 1038 12/09/23 0526 12/10/23 0625 12/11/23 0542 12/11/23 1501  AST 219* 142* 59* 45* 37  ALT 88* 111* 73* 56* 44  BILITOT 1.5* 2.6* 2.1* 1.2* 0.9  -In the setting of Above -Continue to Monitor and Trend Hepatic Fxn carefully and repeat CMP in the AM  Normocytic Anemia -Hgb/Hct Trend: Recent Labs  Lab 12/08/23 1038 12/09/23 0526 12/10/23 0625 12/11/23 0542  HGB 12.7* 12.2* 12.7* 13.5  HCT 38.3* 37.1* 38.6* 40.0  MCV 94.1 95.4 95.3 94.3  -Check Anemia Panel in the AM -Continue to Monitor and Trend and repeat CBC  in the AM  Thrombocytopenia -Platelet Trend: Recent Labs  Lab 12/08/23 1038 12/09/23 0526 12/10/23 0625 12/11/23 0542  PLT 141* 129* 125* 147*  -Continue to Monitor for S/Sx of Bleeding  -Continue to Monitor and Trend and Repeat CBC in the AM  GERD/GI Prophylaxis -C/w PPI with Pantoprazole 40 mg po Daily   Consultants: General Surgery Procedures performed: As delineated as above Disposition: Home Diet recommendation:  Discharge Diet Orders (From admission, onward)     Start     Ordered   12/11/23 0000  Diet - low sodium heart healthy        12/11/23 1555            Cardiac diet DISCHARGE MEDICATION: Allergies as of 12/11/2023       Reactions   Nsaids    Contraindicated due to being on Eliquis.        Medication List     PAUSE taking these medications    triamterene-hydrochlorothiazide 37.5-25 MG tablet Wait to take this until your doctor or other care provider tells you to start again. Commonly known as: MAXZIDE-25 Take 1 tablet by mouth once daily       TAKE these medications    acetaminophen 325 MG tablet Commonly known as: TYLENOL Take 2 tablets (650 mg total) by mouth every 6 (six) hours as needed for mild pain (pain score 1-3) (or Fever >/= 101).   albuterol 108 (90 Base) MCG/ACT inhaler Commonly known as: VENTOLIN HFA INHALE 2 PUFFS BY MOUTH EVERY 6 HOURS AS NEEDED FOR WHEEZING FOR SHORTNESS OF BREATH   apixaban 5 MG Tabs tablet Commonly known as: Eliquis Take 1 tablet (5 mg total) by mouth 2 (two) times daily.   Arnuity Ellipta 100 MCG/ACT Aepb Generic drug: Fluticasone Furoate Inhale 1 puff into the lungs 2 (two) times daily.   bimatoprost 0.01 % Soln Commonly known as: LUMIGAN Place 1 drop into both eyes at bedtime.   Cholecalciferol 25 MCG (1000 UT) tablet Take 1,000 Units by mouth daily.   diclofenac Sodium 1 % Gel Commonly known as: VOLTAREN Apply 2 g topically 4 (four) times daily as needed (pain).   dorzolamide-timolol 2-0.5 % ophthalmic solution Commonly known as: COSOPT Place 1 drop into both eyes 2 (two) times daily.   finasteride 5 MG tablet Commonly known as: PROSCAR Take 5 mg by mouth daily after breakfast.   fluticasone 50 MCG/ACT nasal spray Commonly known as: FLONASE Place 2 sprays into both nostrils 2 (two) times daily.   multivitamin with minerals Tabs tablet Take 1 tablet by mouth daily.   ondansetron 4 MG tablet Commonly known as: ZOFRAN Take 1 tablet (4 mg total) by mouth every 6 (six) hours as needed for nausea.   oxyCODONE 5 MG immediate release tablet Commonly known as:  Roxicodone Take 1 tablet (5 mg total) by mouth every 6 (six) hours as needed for severe pain (pain score 7-10).   pantoprazole 40 MG tablet Commonly known as: PROTONIX Take 1 tablet (40 mg total) by mouth 2 (two) times daily. What changed: when to take this   rifaximin 550 MG Tabs tablet Commonly known as: XIFAXAN Take 1 tablet (550 mg total) by mouth 3 (three) times daily for 14 days.   rosuvastatin 20 MG tablet Commonly known as: CRESTOR Take 1 tablet by mouth once daily   sacubitril-valsartan 24-26 MG Commonly known as: ENTRESTO Take 1 tablet by mouth 2 (two) times daily.   senna-docusate 8.6-50 MG tablet Commonly known as: Senokot-S Take  1 tablet by mouth at bedtime as needed for mild constipation.   SYSTANE OP Place 1 drop into both eyes daily as needed (dry eyes).   tadalafil 5 MG tablet Commonly known as: CIALIS Take 5 mg by mouth at bedtime.   triamcinolone cream 0.1 % Commonly known as: KENALOG Apply 1 Application topically 2 (two) times daily.        Follow-up Information     Chevis Pretty III, MD Follow up in 2 week(s).   Specialty: General Surgery Contact information: [address] [place] [place] [address] [phone]                Discharge Exam: There were no vitals filed for this visit. Vitals:   12/11/23 0559 12/11/23 1214  BP: 116/63 121/72  Pulse: 64 60  Resp: 20 18  Temp: 98.1 F (36.7 C) 97.7 F (36.5 C)  SpO2: 97% 97%   Examination: Physical Exam:  Constitutional: WN/WD Caucasian male in no acute distress Respiratory: Diminished to auscultation bilaterally, no wheezing, rales, rhonchi or crackles. Normal respiratory effort and patient is not tachypenic. No accessory muscle use.  Unlabored breathing Cardiovascular: RRR, no murmurs / rubs / gallops. S1 and S2 auscultated. No extremity edema. Abdomen: Soft, mildly tender.  Slightly distended secondary body habitus.  Bowel sounds positive.  GU:  Deferred. Musculoskeletal: No clubbing / cyanosis of digits/nails. No joint deformity upper and lower extremities Skin: No rashes, lesions, ulcers on limited skin evaluation. No induration; Warm and dry.  Neurologic: CN 2-12 grossly intact with no focal deficits. Romberg sign and cerebellar reflexes not assessed.  Psychiatric: Normal judgment and insight. Alert and oriented x 3. Normal mood and appropriate affect.   Condition at discharge: stable  The results of significant diagnostics from this hospitalization (including imaging, microbiology, ancillary and laboratory) are listed below for reference.   Imaging Studies: US RENAL Result Date: 12/11/2023 CLINICAL DATA:  82 year old male with acute renal insufficiency. EXAM: RENAL / URINARY TRACT ULTRASOUND COMPLETE COMPARISON:  CT Abdomen and Pelvis 05/24/2023. FINDINGS: Right Kidney: Renal measurements: 9.2 x 5.1 x 5.7 cm = volume: 141 mL. No hydronephrosis or right renal mass. Cortical thickness and echogenicity within normal limits. Left Kidney: Renal measurements: 9.5 x 6.1 x 4.8 cm = volume: 146 mL. No hydronephrosis or solid left renal mass. Cortical echogenicity within normal limits. Bladder: Decompressed. Appears normal for degree of bladder distention. Both ureteral jets detected with Doppler. Other: 77 mL prostate. IMPRESSION: 1. No obstructive uropathy or acute renal finding by ultrasound. 2. Decompressed bladder. Electronically Signed   By: Odessa Fleming M.D.   On: 12/11/2023 12:45   DG Cholangiogram Operative Result Date: 12/10/2023 CLINICAL DATA:  Elective surgery Abdominal pain EXAM: INTRAOPERATIVE CHOLANGIOGRAM TECHNIQUE: Cholangiographic images from the C-arm fluoroscopic device were submitted for interpretation post-operatively. Please see the procedural report for the amount of contrast and the fluoroscopy time utilized. FLUOROSCOPY: Radiation Exposure Index (as provided by the fluoroscopic device): 5.1 mGy Kerma COMPARISON:  Ultrasound  abdomen 12/14/2023 FINDINGS: One intraoperative fluoroscopic image and 1 intraoperative cine clip was provided for interpretation. The submitted images demonstrate opacification of intra and extrahepatic bile ducts through cannulated cystic duct. No filling defects identified within the common bile duct to indicate choledocholithiasis. IMPRESSION: Intraoperative cholangiogram demonstrates no evidence of choledocholithiasis. Electronically Signed   By: Acquanetta Belling M.D.   On: 12/10/2023 14:07   US Abdomen Complete Result Date: 12/08/2023 CLINICAL DATA:  Abdominal pain for 2 weeks EXAM: ABDOMEN ULTRASOUND COMPLETE COMPARISON:  CT chest, abdomen and pelvis from 05/24/2023 FINDINGS: Gallbladder: Small amount of layering sludge and suspected tiny nonshadowing gallstones (several small calcified gallstones were present within the gallbladder on 05/24/2023 CT abdomen study). No definite gallbladder wall thickening. No pericholecystic fluid. Sonographic Eulah Pont sign is present per the technologist. A 7 mm gallbladder polyp is identified. Common bile duct: Not visualized. No appreciable intrahepatic biliary ductal dilatation. Liver: No focal lesion identified. Within normal limits in parenchymal echogenicity. Portal vein is patent on color Doppler imaging with normal direction of blood flow towards the liver. IVC: No abnormality visualized. Pancreas: Visualized portion unremarkable. Spleen: Size and appearance within normal limits. Right Kidney: Length: 11.0 cm. Echogenicity within normal limits. No mass or hydronephrosis visualized. Left Kidney: Length: 10.3 cm. Echogenicity within normal limits. No hydronephrosis. Exophytic simple 1.9 x 1.7 x 1.9 cm lower left renal cyst. Abdominal aorta: No aneurysm visualized. Other findings: None. IMPRESSION: 1. Small amount of layering sludge and suspected tiny nonshadowing gallstones (several small calcified gallstones were present within the gallbladder on 05/24/2023 CT abdomen  study). Sonographic Eulah Pont sign is present per the technologist, but there is no definite gallbladder wall thickening or pericholecystic fluid. Consider further evaluation with nuclear medicine hepatobiliary scan as clinically warranted. 2. A 7 mm gallbladder polyp. Recommend follow-up right upper quadrant ultrasound in 6 months. 3. Common bile duct not visualized. No appreciable intrahepatic biliary ductal dilatation. 4. Simple 1.9 cm lower left renal cyst, for which no follow-up imaging is recommended. Electronically Signed   By: Delbert Phenix M.D.   On: 12/08/2023 10:46   Microbiology: Results for orders placed or performed in visit on 09/29/23  Calprotectin, Fecal     Status: Abnormal   Collection Time: 09/29/23 10:16 AM   Specimen: Stool  Result Value Ref Range Status   Calprotectin, Fecal 134 (H) 0 - 120 ug/g Final    Comment: Concentration     Interpretation   Follow-Up < 5 - 50 ug/g     Normal           None >50 -120 ug/g     Borderline       Re-evaluate in 4-6 weeks     >120 ug/g     Abnormal         Repeat as clinically                                    indicated    *Note: Due to a large number of results and/or encounters for the requested time period, some results have not been displayed. A complete set of results can be found in Results Review.   Labs: CBC: Recent Labs  Lab 12/08/23 1038 12/09/23 0526 12/10/23 0625 12/11/23 0542  WBC 6.7 6.6 5.0 6.0  NEUTROABS 5.5  --  3.9 4.7  HGB 12.7* 12.2* 12.7* 13.5  HCT 38.3* 37.1* 38.6* 40.0  MCV 94.1 95.4 95.3 94.3  PLT 141* 129* 125* 147*   Basic Metabolic Panel: Recent Labs  Lab 12/08/23 1038 12/09/23 0526 12/10/23 0625 12/11/23 0542 12/11/23 1501  NA 140 133* 130* 130* 130*  K 4.0 3.7 3.9 4.5 4.4  CL 105 100 100 98 100  CO2 28 24 24 23  21*  GLUCOSE 103* 95 90 143* 134*  BUN 21 21 18  33* 38*  CREATININE 1.14 1.14 1.05 1.57* 1.35*  CALCIUM 10.0 9.5 9.8 10.4* 10.0  MG  --  1.7 1.7 2.2  --  PHOS  --  2.6 2.2* 4.3   --    Liver Function Tests: Recent Labs  Lab 12/08/23 1038 12/09/23 0526 12/10/23 0625 12/11/23 0542 12/11/23 1501  AST 219* 142* 59* 45* 37  ALT 88* 111* 73* 56* 44  ALKPHOS 112 117 110 107 94  BILITOT 1.5* 2.6* 2.1* 1.2* 0.9  PROT 7.1 6.8 6.6 7.0 6.6  ALBUMIN 4.3 3.9 3.6 3.7 3.4*   CBG: No results for input(s): "GLUCAP" in the last 168 hours.  Discharge time spent: greater than 30 minutes.  Signed: Marguerita Merles, DO [facility] 12/11/2023

## 2023-12-11 NOTE — Progress Notes (Signed)
1 Day Post-Op   Subjective/Chief Complaint: Feels better today. Tolerating diet   Objective: Vital signs in last 24 hours: Temp:  [97.6 F (36.4 C)-98.7 F (37.1 C)] 98.1 F (36.7 C) (12/15 0559) Pulse Rate:  [60-66] 64 (12/15 0559) Resp:  [0-20] 20 (12/15 0559) BP: (105-181)/(58-77) 116/63 (12/15 0559) SpO2:  [89 %-100 %] 97 % (12/15 0559) FiO2 (%):  [21 %] 21 % (12/14 2118) Last BM Date : 12/08/23  Intake/Output from previous day: 12/14 0701 - 12/15 0700 In: 600 [I.V.:500; IV Piggyback:100] Out: 570 [Urine:550; Blood:20] Intake/Output this shift: No intake/output data recorded.  General appearance: alert and cooperative Resp: clear to auscultation bilaterally Cardio: regular rate and rhythm GI: soft, minimal tenderness. Incisions look good  Lab Results:  Recent Labs    12/10/23 0625 12/11/23 0542  WBC 5.0 6.0  HGB 12.7* 13.5  HCT 38.6* 40.0  PLT 125* 147*   BMET Recent Labs    12/10/23 0625 12/11/23 0542  NA 130* 130*  K 3.9 4.5  CL 100 98  CO2 24 23  GLUCOSE 90 143*  BUN 18 33*  CREATININE 1.05 1.57*  CALCIUM 9.8 10.4*   PT/INR No results for input(s): "LABPROT", "INR" in the last 72 hours. ABG No results for input(s): "PHART", "HCO3" in the last 72 hours.  Invalid input(s): "PCO2", "PO2"  Studies/Results: DG Cholangiogram Operative Result Date: 12/10/2023 CLINICAL DATA:  Elective surgery Abdominal pain EXAM: INTRAOPERATIVE CHOLANGIOGRAM TECHNIQUE: Cholangiographic images from the C-arm fluoroscopic device were submitted for interpretation post-operatively. Please see the procedural report for the amount of contrast and the fluoroscopy time utilized. FLUOROSCOPY: Radiation Exposure Index (as provided by the fluoroscopic device): 5.1 mGy Kerma COMPARISON:  Ultrasound abdomen 12/14/2023 FINDINGS: One intraoperative fluoroscopic image and 1 intraoperative cine clip was provided for interpretation. The submitted images demonstrate opacification of intra  and extrahepatic bile ducts through cannulated cystic duct. No filling defects identified within the common bile duct to indicate choledocholithiasis. IMPRESSION: Intraoperative cholangiogram demonstrates no evidence of choledocholithiasis. Electronically Signed   By: Acquanetta Belling M.D.   On: 12/10/2023 14:07    Anti-infectives: Anti-infectives (From admission, onward)    Start     Dose/Rate Route Frequency Ordered Stop   12/10/23 1142  ceFAZolin (ANCEF) 2-4 GM/100ML-% IVPB       Note to Pharmacy: Mortimer Fries A: cabinet override      12/10/23 1142 12/10/23 2359       Assessment/Plan: s/p Procedure(s): LAPAROSCOPIC CHOLECYSTECTOMY WITH INTRAOPERATIVE CHOLANGIOGRAM (N/A) Advance diet OK for d/c from surgical standpoint Ok to restart eliquis today  LOS: 2 days    Derrick [name] 12/11/2023

## 2023-12-11 NOTE — Plan of Care (Signed)
  Problem: Pain Management: Goal: General experience of comfort will improve Outcome: Progressing   Problem: Safety: Goal: Ability to remain free from injury will improve Outcome: Progressing   Problem: Skin Integrity: Goal: Risk for impaired skin integrity will decrease Outcome: Progressing

## 2023-12-11 NOTE — Plan of Care (Addendum)
  Problem: Education: Goal: Knowledge of General Education information will improve Description: Including pain rating scale, medication(s)/side effects and non-pharmacologic comfort measures Outcome: Progressing   Problem: Health Behavior/Discharge Planning: Goal: Ability to manage health-related needs will improve Outcome: Progressing   Problem: Clinical Measurements: Goal: Will remain free from infection Outcome: Progressing   Problem: Activity: Goal: Risk for activity intolerance will decrease Outcome: Progressing   Problem: Nutrition: Goal: Adequate nutrition will be maintained Outcome: Progressing   Problem: Coping: Goal: Level of anxiety will decrease Outcome: Progressing    AVS was printed reviewed with and given to the patient . The patient verbalized understanding its content. The pickup address of the pharmacy was indicated. The patient left the unit in a stable condition.

## 2023-12-12 ENCOUNTER — Telehealth: Payer: Self-pay

## 2023-12-12 ENCOUNTER — Other Ambulatory Visit: Payer: Self-pay | Admitting: *Deleted

## 2023-12-12 ENCOUNTER — Other Ambulatory Visit: Payer: Self-pay

## 2023-12-12 DIAGNOSIS — R109 Unspecified abdominal pain: Secondary | ICD-10-CM

## 2023-12-12 DIAGNOSIS — R112 Nausea with vomiting, unspecified: Secondary | ICD-10-CM

## 2023-12-12 MED ORDER — PANTOPRAZOLE SODIUM 40 MG PO TBEC
40.0000 mg | DELAYED_RELEASE_TABLET | Freq: Every day | ORAL | 3 refills | Status: AC
Start: 2023-12-12 — End: ?

## 2023-12-12 NOTE — Transitions of Care (Post Inpatient/ED Visit) (Signed)
12/12/2023  Name: Derrick [name] MRN: [id number] DOB: 06-02-1941  Today's TOC FU Call Status: Today's TOC FU Call Status:: Successful TOC FU Call Completed TOC FU Call Complete Date: 12/12/23 Patient's Name and Date of Birth confirmed.  Transition Care Management Follow-up Telephone Call Date of Discharge: 12/11/23 Discharge Facility: [facility]) Type of Discharge: Inpatient Admission Primary Inpatient Discharge Diagnosis:: Gallbladder removal How have you been since you were released from the hospital?: Better Any questions or concerns?: No  Items Reviewed: Did you receive and understand the discharge instructions provided?: Yes Medications obtained,verified, and reconciled?: Yes (Medications Reviewed) Any new allergies since your discharge?: No Dietary orders reviewed?: NA Do you have support at home?: Yes People in Home: spouse Name of Support/Comfort Primary Source: [name]  Medications Reviewed Today: Medications Reviewed Today     Reviewed by Redge Gainer, RN (Case Manager) on 12/12/23 at 1439  Med List Status: <None>   Medication Order Taking? Sig Documenting Provider Last Dose Status Informant  acetaminophen (TYLENOL) 325 MG tablet [id number]  Take 2 tablets (650 mg total) by mouth every 6 (six) hours as needed for mild pain (pain score 1-3) (or Fever >/= 101). Sheikh, Omair Latif, DO  Active   albuterol (VENTOLIN HFA) 108 [phone] Base) MCG/ACT inhaler [id number] No INHALE 2 PUFFS BY MOUTH EVERY 6 HOURS AS NEEDED FOR WHEEZING FOR SHORTNESS OF Rikki Spearing, Amy E, MD Unknown Active Self           Med Note Merlene Morse, KIMBERLY L   Thu Dec 08, 2023 11:56 AM) prn  apixaban (ELIQUIS) 5 MG TABS tablet [id number] No Take 1 tablet (5 mg total) by mouth 2 (two) times daily. Gaston Islam., NP 12/08/2023  7:00 AM Active Self  bimatoprost (LUMIGAN) 0.01 % SOLN [id number] No Place 1 drop into both eyes at bedtime. [provider] 12/07/2023 Bedtime Active Self   Cholecalciferol 25 MCG (1000 UT) tablet [id number] No Take 1,000 Units by mouth daily.  [provider] 12/08/2023 Morning Active Self  diclofenac Sodium (VOLTAREN) 1 % GEL [id number] No Apply 2 g topically 4 (four) times daily as needed (pain). [provider] Past Week Active Self           Med Note Penni Bombard   Thu Dec 08, 2023 11:57 AM) prn  dorzolamide-timolol (COSOPT) 22.3-6.8 MG/ML ophthalmic solution [id number] No Place 1 drop into both eyes 2 (two) times daily.  [provider] 12/08/2023 Morning Active Self           Med Note Kai Levins, MELISSA B   Wed May 12, 2016  7:26 PM)    finasteride (PROSCAR) 5 MG tablet [id number] No Take 5 mg by mouth daily after breakfast.  [provider] 12/08/2023 Morning Active Self  fluticasone (FLONASE) 50 MCG/ACT nasal spray [id number] No Place 2 sprays into both nostrils 2 (two) times daily. [provider] Past Week Active Self           Med Note Penni Bombard   Thu Dec 08, 2023 11:57 AM) prn  Fluticasone Furoate (ARNUITY ELLIPTA) 100 MCG/ACT AEPB [id number] No Inhale 1 puff into the lungs 2 (two) times daily. Excell Seltzer, MD Unknown Active Self           Med Note Merlene Morse, KIMBERLY L   Thu Dec 08, 2023 11:58 AM) prn  Multiple Vitamin (MULTIVITAMIN WITH MINERALS) TABS tablet [id number] No Take 1 tablet by mouth daily. [provider] 12/07/2023 Evening Active Self  ondansetron (ZOFRAN) 4 MG tablet [id number]  Take 1 tablet (4 mg total) by mouth every 6 (six) hours as needed for nausea. Sheikh, Omair [place], DO  Active   oxyCODONE (ROXICODONE) 5 MG immediate release tablet [id number]  Take 1 tablet (5 mg total) by mouth every 6 (six) hours as needed for severe pain (pain score 7-10). Chevis Pretty III, MD  Active   pantoprazole (PROTONIX) 40 MG tablet [id number] No Take 1 tablet (40 mg total) by mouth 2 (two) times daily.  Patient taking differently: Take 40 mg by mouth daily.   Hyacinth Meeker [place], [place] 12/08/2023 Morning Active Self  Polyethyl Glycol-Propyl Glycol (SYSTANE OP) [id number] No Place 1 drop into both eyes daily as needed (dry eyes). [provider] 12/07/2023 Morning Active Self  rifaximin (XIFAXAN) 550 MG TABS tablet [id number]  Take 1 tablet (550 mg total) by mouth 3 (three) times daily for 14 days. Beverley Fiedler, MD  Active Self           Med Note Penni Bombard   Thu Dec 08, 2023 11:59 AM) Haven't started yet  rosuvastatin (CRESTOR) 20 MG tablet [id number] No Take 1 tablet by mouth once daily Tonny Bollman, MD 12/07/2023 Bedtime Active Self  sacubitril-valsartan (ENTRESTO) 24-26 MG [id number] No Take 1 tablet by mouth 2 (two) times daily. Tonny Bollman, MD 12/08/2023 Morning Active Self  senna-docusate (SENOKOT-S) 8.6-50 MG tablet [id number]  Take 1 tablet by mouth at bedtime as needed for mild constipation. Sheikh, Omair [place], DO  Active   tadalafil (CIALIS) 5 MG tablet [id number] No Take 5 mg by mouth at bedtime.  [provider] 12/07/2023 Bedtime Active Self           Med Note Merlene Morse, KIMBERLY L   Thu Dec 08, 2023 12:01 PM)    triamcinolone cream (KENALOG) 0.1 % [id number] No Apply 1 Application topically 2 (two) times daily. [provider] Past Week Active Self  triamterene-hydrochlorothiazide (MAXZIDE-25) 37.5-25 MG tablet [id number] No Take 1 tablet by mouth once daily Tonny Bollman, MD 12/08/2023 Morning Active Self            Home Care and Equipment/Supplies: Were Home Health Services Ordered?: NA Any new equipment or medical supplies ordered?: NA  Functional Questionnaire: Do you need assistance with bathing/showering or dressing?: No Do you need assistance with meal preparation?: No Do you need assistance with eating?: No Do you have difficulty maintaining continence: No Do you need assistance with getting out of bed/getting out of a chair/moving?: No Do you have difficulty managing or taking your  medications?: No  Follow up appointments reviewed: PCP Follow-up appointment confirmed?: Yes Date of PCP follow-up appointment?: 12/14/23 Follow-up Provider: [facility] Follow-up appointment confirmed?: NA Do you need transportation to your follow-up appointment?: No Do you understand care options if your condition(s) worsen?: Yes-patient verbalized understanding  SDOH Interventions Today    Flowsheet Row Most Recent Value  SDOH Interventions   Food Insecurity Interventions Intervention Not Indicated  Housing Interventions Intervention Not Indicated  Transportation Interventions Intervention Not Indicated  Utilities Interventions Intervention Not Indicated      TOC Outreach completed today. The patient states he is having little pain and he takes a Tylenol for it. He has not taken the opioids. He has made his hospital follow up with the PCP and he is anxious to have labs done regarding his renal function because he was taken off of his Maxide due to poor renal function. He also tends  to be anemic and he cannot tolerate iron tablets and has had to have iron infusions. He has already walked about 30 minutes today and uses his cane. He does not think he needs physical therapy. Declines further follow up from TOC.  Deidre Ala, RN Medical illustrator [facility] [phone]

## 2023-12-12 NOTE — Transitions of Care (Post Inpatient/ED Visit) (Signed)
12/12/2023  Name: Derrick [name] MRN: [id number] DOB: 22-Aug-1941  Today's TOC FU Call Status: Today's TOC FU Call Status:: Successful TOC FU Call Completed TOC FU Call Complete Date: 12/12/23 Patient's Name and Date of Birth confirmed.  Transition Care Management Follow-up Telephone Call Date of Discharge: 12/11/23 Discharge Facility: [facility]) Type of Discharge: Inpatient Admission Primary Inpatient Discharge Diagnosis:: RUQ pain How have you been since you were released from the hospital?: Better Any questions or concerns?: No  Items Reviewed: Did you receive and understand the discharge instructions provided?: Yes Medications obtained,verified, and reconciled?: Yes (Medications Reviewed) Any new allergies since your discharge?: No Dietary orders reviewed?: Yes Do you have support at home?: Yes People in Home: spouse  Medications Reviewed Today: Medications Reviewed Today     Reviewed by Karena Addison, LPN (Licensed Practical Nurse) on 12/12/23 at 1242  Med List Status: <None>   Medication Order Taking? Sig Documenting Provider Last Dose Status Informant  acetaminophen (TYLENOL) 325 MG tablet [id number]  Take 2 tablets (650 mg total) by mouth every 6 (six) hours as needed for mild pain (pain score 1-3) (or Fever >/= 101). Sheikh, Omair Latif, DO  Active   albuterol (VENTOLIN HFA) 108 [phone] Base) MCG/ACT inhaler [id number] No INHALE 2 PUFFS BY MOUTH EVERY 6 HOURS AS NEEDED FOR WHEEZING FOR SHORTNESS OF Rikki Spearing, Amy E, MD Unknown Active Self           Med Note Merlene Morse, KIMBERLY L   Thu Dec 08, 2023 11:56 AM) prn  apixaban (ELIQUIS) 5 MG TABS tablet [id number] No Take 1 tablet (5 mg total) by mouth 2 (two) times daily. Gaston Islam., NP 12/08/2023  7:00 AM Active Self  bimatoprost (LUMIGAN) 0.01 % SOLN [id number] No Place 1 drop into both eyes at bedtime. [provider] 12/07/2023 Bedtime Active Self  Cholecalciferol 25 MCG (1000 UT) tablet  [id number] No Take 1,000 Units by mouth daily.  [provider] 12/08/2023 Morning Active Self  diclofenac Sodium (VOLTAREN) 1 % GEL [id number] No Apply 2 g topically 4 (four) times daily as needed (pain). [provider] Past Week Active Self           Med Note Penni Bombard   Thu Dec 08, 2023 11:57 AM) prn  dorzolamide-timolol (COSOPT) 22.3-6.8 MG/ML ophthalmic solution [id number] No Place 1 drop into both eyes 2 (two) times daily.  [provider] 12/08/2023 Morning Active Self           Med Note Kai Levins, MELISSA B   Wed May 12, 2016  7:26 PM)    finasteride (PROSCAR) 5 MG tablet [id number] No Take 5 mg by mouth daily after breakfast.  [provider] 12/08/2023 Morning Active Self  fluticasone (FLONASE) 50 MCG/ACT nasal spray [id number] No Place 2 sprays into both nostrils 2 (two) times daily. [provider] Past Week Active Self           Med Note Penni Bombard   Thu Dec 08, 2023 11:57 AM) prn  Fluticasone Furoate (ARNUITY ELLIPTA) 100 MCG/ACT AEPB [id number] No Inhale 1 puff into the lungs 2 (two) times daily. Excell Seltzer, MD Unknown Active Self           Med Note Merlene Morse, KIMBERLY L   Thu Dec 08, 2023 11:58 AM) prn  Multiple Vitamin (MULTIVITAMIN WITH MINERALS) TABS tablet [id number] No Take 1 tablet by mouth daily. [provider] 12/07/2023 Evening Active Self  ondansetron (ZOFRAN) 4 MG tablet  [id number]  Take 1 tablet (4 mg total) by mouth every 6 (six) hours as needed for nausea. Sheikh, Omair [place], DO  Active   oxyCODONE (ROXICODONE) 5 MG immediate release tablet [id number]  Take 1 tablet (5 mg total) by mouth every 6 (six) hours as needed for severe pain (pain score 7-10). Chevis Pretty III, MD  Active   pantoprazole (PROTONIX) 40 MG tablet [id number] No Take 1 tablet (40 mg total) by mouth 2 (two) times daily.  Patient taking differently: Take 40 mg by mouth daily.   Hyacinth Meeker [place], [place] 12/08/2023 Morning Active Self   Polyethyl Glycol-Propyl Glycol (SYSTANE OP) [id number] No Place 1 drop into both eyes daily as needed (dry eyes). [provider] 12/07/2023 Morning Active Self  rifaximin (XIFAXAN) 550 MG TABS tablet [id number]  Take 1 tablet (550 mg total) by mouth 3 (three) times daily for 14 days. Beverley Fiedler, MD  Active Self           Med Note Penni Bombard   Thu Dec 08, 2023 11:59 AM) Haven't started yet  rosuvastatin (CRESTOR) 20 MG tablet [id number] No Take 1 tablet by mouth once daily Tonny Bollman, MD 12/07/2023 Bedtime Active Self  sacubitril-valsartan (ENTRESTO) 24-26 MG [id number] No Take 1 tablet by mouth 2 (two) times daily. Tonny Bollman, MD 12/08/2023 Morning Active Self  senna-docusate (SENOKOT-S) 8.6-50 MG tablet [id number]  Take 1 tablet by mouth at bedtime as needed for mild constipation. Sheikh, Omair [place], DO  Active   tadalafil (CIALIS) 5 MG tablet [id number] No Take 5 mg by mouth at bedtime.  [provider] 12/07/2023 Bedtime Active Self           Med Note Merlene Morse, KIMBERLY L   Thu Dec 08, 2023 12:01 PM)    triamcinolone cream (KENALOG) 0.1 % [id number] No Apply 1 Application topically 2 (two) times daily. [provider] Past Week Active Self  triamterene-hydrochlorothiazide (MAXZIDE-25) 37.5-25 MG tablet [id number] No Take 1 tablet by mouth once daily Tonny Bollman, MD 12/08/2023 Morning Active Self            Home Care and Equipment/Supplies: Were Home Health Services Ordered?: NA Any new equipment or medical supplies ordered?: NA  Functional Questionnaire: Do you need assistance with bathing/showering or dressing?: No Do you need assistance with meal preparation?: No Do you need assistance with eating?: No Do you have difficulty maintaining continence: No Do you need assistance with getting out of bed/getting out of a chair/moving?: No Do you have difficulty managing or taking your medications?: No  Follow up appointments  reviewed: PCP Follow-up appointment confirmed?: Yes Date of PCP follow-up appointment?: 12/14/23 Follow-up Provider: [facility] Follow-up appointment confirmed?: NA Do you need transportation to your follow-up appointment?: No Do you understand care options if your condition(s) worsen?: Yes-patient verbalized understanding    SIGNATURE Karena Addison, LPN [facility] Nurse Health Advisor Direct Dial [phone]

## 2023-12-12 NOTE — Telephone Encounter (Signed)
Lets check a CBC and CMP on Thursday or Friday of this week  Given that he is just had gallbladder surgery I would not take the rifaximin for at least 2 weeks and he may not need it now that we know the issue was gallbladder  Certainly hang onto the rifaximin but we can discuss when and if to take it later on

## 2023-12-13 LAB — SURGICAL PATHOLOGY

## 2023-12-13 NOTE — Telephone Encounter (Signed)
Reviewed

## 2023-12-14 ENCOUNTER — Encounter: Payer: Self-pay | Admitting: Family Medicine

## 2023-12-14 ENCOUNTER — Ambulatory Visit: Payer: Medicare Other | Admitting: Family Medicine

## 2023-12-14 VITALS — BP 110/58 | HR 64 | Temp 98.9°F | Ht 68.0 in | Wt 183.2 lb

## 2023-12-14 DIAGNOSIS — R1011 Right upper quadrant pain: Secondary | ICD-10-CM

## 2023-12-14 DIAGNOSIS — Z9049 Acquired absence of other specified parts of digestive tract: Secondary | ICD-10-CM

## 2023-12-14 DIAGNOSIS — I959 Hypotension, unspecified: Secondary | ICD-10-CM

## 2023-12-14 DIAGNOSIS — E871 Hypo-osmolality and hyponatremia: Secondary | ICD-10-CM

## 2023-12-14 DIAGNOSIS — R2681 Unsteadiness on feet: Secondary | ICD-10-CM | POA: Diagnosis not present

## 2023-12-14 LAB — CBC WITH DIFFERENTIAL/PLATELET
Basophils Absolute: 0 10*3/uL (ref 0.0–0.1)
Basophils Relative: 0.6 % (ref 0.0–3.0)
Eosinophils Absolute: 0.2 10*3/uL (ref 0.0–0.7)
Eosinophils Relative: 3 % (ref 0.0–5.0)
HCT: 39.2 % (ref 39.0–52.0)
Hemoglobin: 13.3 g/dL (ref 13.0–17.0)
Lymphocytes Relative: 20 % (ref 12.0–46.0)
Lymphs Abs: 1 10*3/uL (ref 0.7–4.0)
MCHC: 34 g/dL (ref 30.0–36.0)
MCV: 93.6 fL (ref 78.0–100.0)
Monocytes Absolute: 0.6 10*3/uL (ref 0.1–1.0)
Monocytes Relative: 12.4 % — ABNORMAL HIGH (ref 3.0–12.0)
Neutro Abs: 3.3 10*3/uL (ref 1.4–7.7)
Neutrophils Relative %: 64 % (ref 43.0–77.0)
Platelets: 194 10*3/uL (ref 150.0–400.0)
RBC: 4.19 Mil/uL — ABNORMAL LOW (ref 4.22–5.81)
RDW: 13.6 % (ref 11.5–15.5)
WBC: 5.2 10*3/uL (ref 4.0–10.5)

## 2023-12-14 LAB — COMPREHENSIVE METABOLIC PANEL
ALT: 31 U/L (ref 0–53)
AST: 26 U/L (ref 0–37)
Albumin: 3.9 g/dL (ref 3.5–5.2)
Alkaline Phosphatase: 94 U/L (ref 39–117)
BUN: 23 mg/dL (ref 6–23)
CO2: 28 meq/L (ref 19–32)
Calcium: 10.7 mg/dL — ABNORMAL HIGH (ref 8.4–10.5)
Chloride: 103 meq/L (ref 96–112)
Creatinine, Ser: 1.09 mg/dL (ref 0.40–1.50)
GFR: 64.01 mL/min (ref >60.00)
Glucose, Bld: 103 mg/dL — ABNORMAL HIGH (ref 70–99)
Potassium: 4.4 meq/L (ref 3.5–5.1)
Sodium: 137 meq/L (ref 135–145)
Total Bilirubin: 1 mg/dL (ref 0.2–1.2)
Total Protein: 7 g/dL (ref 6.0–8.3)

## 2023-12-14 LAB — PHOSPHORUS: Phosphorus: 2.7 mg/dL (ref 2.3–4.6)

## 2023-12-14 LAB — MAGNESIUM: Magnesium: 1.6 mg/dL (ref 1.5–2.5)

## 2023-12-14 MED ORDER — TRAMADOL HCL 50 MG PO TABS: 50.0000 mg | ORAL_TABLET | Freq: Three times a day (TID) | ORAL | 0 refills | Status: AC | PRN

## 2023-12-14 NOTE — Assessment & Plan Note (Signed)
Acute, patient noted some improvement with increasing p.o. intake.  Continues to hold Maxide. Will evaluate with labs for blood loss, but no known source of bleeding. Will have him hold Entresto if blood pressure less than 110/60.

## 2023-12-14 NOTE — Assessment & Plan Note (Signed)
Acute, significant postop pain but patient only using Tylenol 650 mg 2 tablets every 6-8 hours for pain.  Refuses oxycodone. Will provide tramadol 50 mg to use as needed 3 times daily for more significant pain. No fever, no skin change at surgical sites and minimal concern for postop infection. Will evaluate with CBC and liver function tests. Patient has tried to call to set up postop surgery goal evaluation but states was told they will contact him for follow-up. If pain or increasing, will consider ERCP for evaluation for common bile duct stone.

## 2023-12-14 NOTE — Assessment & Plan Note (Signed)
Acute, most likely secondary to weakness and debilitation/deconditioning postop.  He is trying to slowly increase physical activity.

## 2023-12-14 NOTE — Patient Instructions (Addendum)
Please stop at the lab to have labs drawn.  Use tramadol for moderate to severe pain.  Continue to hold Maxide.  Hold Entresto if BP < 110/60.  Keep up with fluids. Call to set up follow up with surgery ASAP... let me know if you are having further  trouble making this appt.  If pain not well controlled  or if new fever or BP remaining low.. go to ER.

## 2023-12-14 NOTE — Progress Notes (Signed)
Patient ID: Derrick [name], male    DOB: 28-Nov-1941, 82 y.o.   MRN: [id number]  This visit was conducted in person.  BP (!) 110/58   Pulse 64   Temp 98.9 F (37.2 C) (Oral)   Ht 5\' 8"  (1.727 m)   Wt 183 lb 3.2 oz (83.1 kg)   SpO2 96%   BMI 27.86 kg/m    CC:  Chief Complaint  Patient presents with   Follow-up    Hospital f/u patient had gall bladder surgery     Blood Pressure Check    Patient has been following BP yesterday it was as low as 84/37 and highest was 110/56. He is unsteady on his feet today. Still does not feel well    Subjective:   HPI: Derrick [name] is a 82 y.o. male with a past medical history significant for abdominal to CAD status post CABG and NSTEMI, history of chronic diastolic heart failure with preserved ejection fraction, mitral valve prolapse, pulmonary sarcoidosis, BPH, GERD, hypertension, hyperlipidemia, OSA on CPAP, permanent atrial fibrillation on anticoagulation presenting on 12/14/2023 for Follow-up [facility] f/u patient had gall bladder surgery /) and Blood Pressure Check (Patient has been following BP yesterday it was as low as 84/37 and highest was 110/56. He is unsteady on his feet today. Still does not feel well)  Recent hospital admission December 08, 2023, note reviewed Discharge date December 11, 2023  Presented to the ED with right upper quadrant/epigastric abdominal pain Ultrasound showed cholelithiasis and abnormal LFTs, hyperbilirubinemia, normal lipase Cholecystectomy on December 14 Postoperatively he had acute kidney insufficiency but this began to improve before discharge with fluid hydration Maxide was held.  Entresto continued, blood pressure discharge 124/60  AKI -BUN/Cr Trend:        Recent Labs  Lab 12/08/23 1038 12/09/23 0526 12/10/23 0625 12/11/23 0542 12/11/23 1501  BUN 21 21 18  33* 38*  CREATININE 1.14 1.14 1.05 1.57* 1.35*     -LFT and Bilirubin Trend:        Recent Labs  Lab 12/08/23 1038  12/09/23 0526 12/10/23 0625 12/11/23 0542 12/11/23 1501  AST 219* 142* 59* 45* 37  ALT 88* 111* 73* 56* 44  BILITOT 1.5* 2.6* 2.1* 1.2* 0.9    On day of discharge hemoglobin normalized to 13.5, platelets remained stable during hospitalization from 1 41-1 47 with history of thrombocytopenia Continued on pantoprazole 40 mg daily  Today he presents with his son.  He has noted low blood pressures and has been unsteady on his feet. 84/37-98/39, 110/59, HR 64-66, feeling light headed No patient he was restarted on Eliquis at discharge... no bleeding noted.  No fever.  Abdominal pain, right  is poorly controlled... taking 2  tylenol 650 mg daily every 6-8 hours.  Does not seem to be improving. Pain 7/10 on pain scale.  No discharge  or redness at surgical sites.    He refuses to take oxycodone ( younger brother died from this med)  He has been trying to drink more. Urinating normally.   Colonoscopy no source of anemia.   Scheduled for afib ablation Dr. Elberta Fortis 2/18.  Relevant past medical, surgical, family and social history reviewed and updated as indicated. Interim medical history since our last visit reviewed. Allergies and medications reviewed and updated. Outpatient Medications Prior to Visit  Medication Sig Dispense Refill   acetaminophen (TYLENOL) 325 MG tablet Take 2 tablets (650 mg total) by mouth every 6 (six) hours as needed for mild pain (  pain score 1-3) (or Fever >/= 101). 20 tablet 0   albuterol (VENTOLIN HFA) 108 (90 Base) MCG/ACT inhaler INHALE 2 PUFFS BY MOUTH EVERY 6 HOURS AS NEEDED FOR WHEEZING FOR SHORTNESS OF BREATH 9 g 0   apixaban (ELIQUIS) 5 MG TABS tablet Take 1 tablet (5 mg total) by mouth 2 (two) times daily. 180 tablet 1   bimatoprost (LUMIGAN) 0.01 % SOLN Place 1 drop into both eyes at bedtime.     Cholecalciferol 25 MCG (1000 UT) tablet Take 1,000 Units by mouth daily.      diclofenac Sodium (VOLTAREN) 1 % GEL Apply 2 g topically 4 (four) times daily as  needed (pain).     dorzolamide-timolol (COSOPT) 22.3-6.8 MG/ML ophthalmic solution Place 1 drop into both eyes 2 (two) times daily.      finasteride (PROSCAR) 5 MG tablet Take 5 mg by mouth daily after breakfast.      fluticasone (FLONASE) 50 MCG/ACT nasal spray Place 2 sprays into both nostrils 2 (two) times daily.     Fluticasone Furoate (ARNUITY ELLIPTA) 100 MCG/ACT AEPB Inhale 1 puff into the lungs 2 (two) times daily. 30 each 11   Multiple Vitamin (MULTIVITAMIN WITH MINERALS) TABS tablet Take 1 tablet by mouth daily.     ondansetron (ZOFRAN) 4 MG tablet Take 1 tablet (4 mg total) by mouth every 6 (six) hours as needed for nausea. 20 tablet 0   pantoprazole (PROTONIX) 40 MG tablet Take 1 tablet (40 mg total) by mouth daily. 90 tablet 3   Polyethyl Glycol-Propyl Glycol (SYSTANE OP) Place 1 drop into both eyes daily as needed (dry eyes).     rifaximin (XIFAXAN) 550 MG TABS tablet Take 1 tablet (550 mg total) by mouth 3 (three) times daily for 14 days. 42 tablet 0   rosuvastatin (CRESTOR) 20 MG tablet Take 1 tablet by mouth once daily 90 tablet 2   sacubitril-valsartan (ENTRESTO) 24-26 MG Take 1 tablet by mouth 2 (two) times daily. 180 tablet 3   senna-docusate (SENOKOT-S) 8.6-50 MG tablet Take 1 tablet by mouth at bedtime as needed for mild constipation. 30 tablet 0   tadalafil (CIALIS) 5 MG tablet Take 5 mg by mouth at bedtime.      triamcinolone cream (KENALOG) 0.1 % Apply 1 Application topically 2 (two) times daily.     triamterene-hydrochlorothiazide (MAXZIDE-25) 37.5-25 MG tablet Take 1 tablet by mouth once daily 90 tablet 3   oxyCODONE (ROXICODONE) 5 MG immediate release tablet Take 1 tablet (5 mg total) by mouth every 6 (six) hours as needed for severe pain (pain score 7-10). 10 tablet 0   No facility-administered medications prior to visit.     Per HPI unless specifically indicated in ROS section below Review of Systems  Constitutional:  Positive for fatigue. Negative for fever.   HENT:  Negative for ear pain.   Eyes:  Negative for pain.  Respiratory:  Negative for cough and shortness of breath.   Cardiovascular:  Negative for chest pain, palpitations and leg swelling.  Gastrointestinal:  Positive for abdominal pain. Negative for blood in stool, diarrhea, nausea and vomiting.  Genitourinary:  Negative for dysuria.  Musculoskeletal:  Negative for arthralgias.  Neurological:  Positive for weakness. Negative for syncope, light-headedness and headaches.  Psychiatric/Behavioral:  Negative for dysphoric mood.    Objective:  BP (!) 110/58   Pulse 64   Temp 98.9 F (37.2 C) (Oral)   Ht 5\' 8"  (1.727 m)   Wt 183 lb 3.2 oz (83.1 kg)  SpO2 96%   BMI 27.86 kg/m   Wt Readings from Last 3 Encounters:  12/14/23 183 lb 3.2 oz (83.1 kg)  12/01/23 185 lb (83.9 kg)  11/14/23 185 lb (83.9 kg)      Physical Exam Constitutional:      Appearance: He is well-developed.  HENT:     Head: Normocephalic.     Right Ear: Hearing normal.     Left Ear: Hearing normal.     Nose: Nose normal.  Neck:     Thyroid: No thyroid mass or thyromegaly.     Vascular: No carotid bruit.     Trachea: Trachea normal.  Cardiovascular:     Rate and Rhythm: Normal rate and regular rhythm.     Pulses: Normal pulses.     Heart sounds: Heart sounds not distant. No murmur heard.    No friction rub. No gallop.     Comments: No peripheral edema Pulmonary:     Effort: Pulmonary effort is normal. No respiratory distress.     Breath sounds: Normal breath sounds.  Abdominal:     General: Abdomen is flat. Bowel sounds are normal. There is no distension.     Tenderness: There is abdominal tenderness in the right upper quadrant and epigastric area. There is no right CVA tenderness or left CVA tenderness.     Comments: Well-healing surgical sites  Skin:    General: Skin is warm and dry.     Findings: No rash.  Psychiatric:        Speech: Speech normal.        Behavior: Behavior normal.         Thought Content: Thought content normal.       Results for orders placed or performed in visit on 12/14/23  Comprehensive metabolic panel   Collection Time: 12/14/23  9:26 AM  Result Value Ref Range   Sodium 137 135 - 145 mEq/L   Potassium 4.4 3.5 - 5.1 mEq/L   Chloride 103 96 - 112 mEq/L   CO2 28 19 - 32 mEq/L   Glucose, Bld 103 (H) 70 - 99 mg/dL   BUN 23 6 - 23 mg/dL   Creatinine, Ser 1.61 0.40 - 1.50 mg/dL   Total Bilirubin 1.0 0.2 - 1.2 mg/dL   Alkaline Phosphatase 94 39 - 117 U/L   AST 26 0 - 37 U/L   ALT 31 0 - 53 U/L   Total Protein 7.0 6.0 - 8.3 g/dL   Albumin 3.9 3.5 - 5.2 g/dL   GFR 09.60 >45.40 mL/min   Calcium 10.7 (H) 8.4 - 10.5 mg/dL  CBC with Differential/Platelet   Collection Time: 12/14/23  9:26 AM  Result Value Ref Range   WBC 5.2 4.0 - 10.5 K/uL   RBC 4.19 (L) 4.22 - 5.81 Mil/uL   Hemoglobin 13.3 13.0 - 17.0 g/dL   HCT 98.1 19.1 - 47.8 %   MCV 93.6 78.0 - 100.0 fl   MCHC 34.0 30.0 - 36.0 g/dL   RDW 29.5 62.1 - 30.8 %   Platelets 194.0 150.0 - 400.0 K/uL   Neutrophils Relative % 64.0 43.0 - 77.0 %   Lymphocytes Relative 20.0 12.0 - 46.0 %   Monocytes Relative 12.4 (H) 3.0 - 12.0 %   Eosinophils Relative 3.0 0.0 - 5.0 %   Basophils Relative 0.6 0.0 - 3.0 %   Neutro Abs 3.3 1.4 - 7.7 K/uL   Lymphs Abs 1.0 0.7 - 4.0 K/uL   Monocytes Absolute 0.6 0.1 -  1.0 K/uL   Eosinophils Absolute 0.2 0.0 - 0.7 K/uL   Basophils Absolute 0.0 0.0 - 0.1 K/uL  Magnesium   Collection Time: 12/14/23  9:26 AM  Result Value Ref Range   Magnesium 1.6 1.5 - 2.5 mg/dL  Phosphorus   Collection Time: 12/14/23  9:26 AM  Result Value Ref Range   Phosphorus 2.7 2.3 - 4.6 mg/dL   *Note: Due to a large number of results and/or encounters for the requested time period, some results have not been displayed. A complete set of results can be found in Results Review.    Assessment and Plan  S/P cholecystectomy  Hypotension, unspecified hypotension type Assessment & Plan: Acute,  patient noted some improvement with increasing p.o. intake.  Continues to hold Maxide. Will evaluate with labs for blood loss, but no known source of bleeding. Will have him hold Entresto if blood pressure less than 110/60.   Orders: -     Comprehensive metabolic panel -     CBC with Differential/Platelet -     Magnesium -     Phosphorus  Unsteady gait when walking Assessment & Plan: Acute, most likely secondary to weakness and debilitation/deconditioning postop.  He is trying to slowly increase physical activity.     Right upper quadrant abdominal pain Assessment & Plan: Acute, significant postop pain but patient only using Tylenol 650 mg 2 tablets every 6-8 hours for pain.  Refuses oxycodone. Will provide tramadol 50 mg to use as needed 3 times daily for more significant pain. No fever, no skin change at surgical sites and minimal concern for postop infection. Will evaluate with CBC and liver function tests. Patient has tried to call to set up postop surgery goal evaluation but states was told they will contact him for follow-up. If pain or increasing, will consider ERCP for evaluation for common bile duct stone.    Orders: -     Comprehensive metabolic panel -     CBC with Differential/Platelet -     Magnesium -     Phosphorus  Hyponatremia Assessment & Plan: Due for reevaluation.  Good p.o. intake  Orders: -     Comprehensive metabolic panel  Other orders -     traMADol HCl; Take 1 tablet (50 mg total) by mouth every 8 (eight) hours as needed for up to 5 days.  Dispense: 15 tablet; Refill: 0    No follow-ups on file.   Kerby Nora, MD

## 2023-12-14 NOTE — Assessment & Plan Note (Signed)
Due for reevaluation.  Good p.o. intake

## 2023-12-23 ENCOUNTER — Ambulatory Visit (INDEPENDENT_AMBULATORY_CARE_PROVIDER_SITE_OTHER): Payer: Medicare Other

## 2023-12-23 DIAGNOSIS — I495 Sick sinus syndrome: Secondary | ICD-10-CM

## 2023-12-24 LAB — CUP PACEART REMOTE DEVICE CHECK
Battery Remaining Longevity: 112 mo
Battery Voltage: 3.01 V
Brady Statistic RA Percent Paced: 0.2 %
Brady Statistic RV Percent Paced: 99.93 %
Date Time Interrogation Session: 20241227064917
Implantable Lead Connection Status: 753985
Implantable Lead Connection Status: 753985
Implantable Lead Connection Status: 753985
Implantable Lead Implant Date: 20170517
Implantable Lead Implant Date: 20170517
Implantable Lead Implant Date: 20230928
Implantable Lead Location: 753858
Implantable Lead Location: 753859
Implantable Lead Location: 753860
Implantable Lead Model: 4398
Implantable Lead Model: 5076
Implantable Lead Model: 5076
Implantable Pulse Generator Implant Date: 20230928
Lead Channel Impedance Value: 285 Ohm
Lead Channel Impedance Value: 285 Ohm
Lead Channel Impedance Value: 285 Ohm
Lead Channel Impedance Value: 323 Ohm
Lead Channel Impedance Value: 323 Ohm
Lead Channel Impedance Value: 323 Ohm
Lead Channel Impedance Value: 361 Ohm
Lead Channel Impedance Value: 399 Ohm
Lead Channel Impedance Value: 494 Ohm
Lead Channel Impedance Value: 494 Ohm
Lead Channel Impedance Value: 494 Ohm
Lead Channel Impedance Value: 532 Ohm
Lead Channel Impedance Value: 532 Ohm
Lead Channel Impedance Value: 532 Ohm
Lead Channel Pacing Threshold Amplitude: 0.625 V
Lead Channel Pacing Threshold Amplitude: 1 V
Lead Channel Pacing Threshold Amplitude: 1 V
Lead Channel Pacing Threshold Pulse Width: 0.4 ms
Lead Channel Pacing Threshold Pulse Width: 0.4 ms
Lead Channel Pacing Threshold Pulse Width: 0.6 ms
Lead Channel Sensing Intrinsic Amplitude: 0.875 mV
Lead Channel Sensing Intrinsic Amplitude: 0.875 mV
Lead Channel Sensing Intrinsic Amplitude: 11.625 mV
Lead Channel Setting Pacing Amplitude: 1.5 V
Lead Channel Setting Pacing Amplitude: 1.5 V
Lead Channel Setting Pacing Amplitude: 2 V
Lead Channel Setting Pacing Pulse Width: 0.4 ms
Lead Channel Setting Pacing Pulse Width: 0.6 ms
Lead Channel Setting Sensing Sensitivity: 1.2 mV
Zone Setting Status: 755011
Zone Setting Status: 755011

## 2024-01-02 ENCOUNTER — Ambulatory Visit: Payer: Medicare Other | Attending: Family Medicine | Admitting: Audiologist

## 2024-01-02 DIAGNOSIS — H903 Sensorineural hearing loss, bilateral: Secondary | ICD-10-CM | POA: Insufficient documentation

## 2024-01-02 NOTE — Procedures (Signed)
  Outpatient Audiology and [facility] [address] [place], [place]  [address] [phone]  AUDIOLOGICAL  EVALUATION  NAME: Derrick [name]     DOB:   1941-04-11      MRN: [id number]                                                                                     DATE: 01/02/2024     REFERENT: Avelina Greig BRAVO, MD STATUS: Outpatient DIAGNOSIS: Sensorineural Hearing Loss, Tinnitus    History: Amit was seen for an audiological evaluation due to difficulty hearing people on occasion and intermittent tinnitus.  Lue denies pain or pressure in either ear. He has occasional ringing that gets worse with fatigue or stress. He has occasional clicking in the left ear. Daaron had a hearing test years ago, hearing aids not recommended.  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 Also history of firearm noise exposure when young.  Medical history shows no risk for hearing loss.    Evaluation:  Otoscopy showed a clear view of the tympanic membranes, bilaterally Tympanometry results were consistent with normal middle ear function, bilaterally   Audiometric testing was completed using Conventional Audiometry techniques with insert earphones and supraural headphones. Test results are consistent with normal hearing sloping after 3kHz to  moderate sensorineural  hearing loss bilaterally. Slight asymmetry with left ear worse. Speech Recognition Thresholds were obtained at  25dB HL in the right ear and at 25 dB HL in the left ear. Word Recognition Testing was completed at 65dB and Alakai scored 100% in each ear.   Results:  The test results were reviewed with Niall. Ken has a sloping sensorineural  hearing loss. Hearing normal 250-2kHz bilaterally. Not yet hearing aids candidate. If asymmetry worsens or unilateral symptoms arise then see Otolaryngology.  Audiogram printed and provided to Juancarlos.    Recommendations: Annual audiometric testing  recommended to monitor hearing loss for progression.     30 minutes spent testing and counseling on results.   If you have any questions please feel free to contact me at [phone].  Lauraine Ka Au.D.  Audiologist   01/02/2024  9:55 AM  Cc: Avelina Greig BRAVO, MD

## 2024-01-04 ENCOUNTER — Telehealth: Payer: Self-pay | Admitting: Cardiology

## 2024-01-04 NOTE — Telephone Encounter (Signed)
  Pt would like to follow up when he can schedule his ablation.

## 2024-01-04 NOTE — Telephone Encounter (Signed)
 Spoke with patient and he is aware Dr. Elberta Fortis nurse is out today and will return call when she is back in office to schedule ablation

## 2024-01-05 NOTE — Telephone Encounter (Addendum)
 Pt aware he is still on scheduled for 2/18 and that procedure scheduler will be getting in touch with him soon. Aware will also forward to her and ask her to place him at top of wait list if sooner ablation date becomes available.   Patient verbalized understanding and agreeable to plan.    (Pt been in afib since October)

## 2024-01-06 ENCOUNTER — Other Ambulatory Visit: Payer: Self-pay | Admitting: Nurse Practitioner

## 2024-01-06 ENCOUNTER — Other Ambulatory Visit: Payer: Self-pay | Admitting: Cardiovascular Disease

## 2024-01-06 DIAGNOSIS — I4891 Unspecified atrial fibrillation: Secondary | ICD-10-CM

## 2024-01-09 NOTE — Telephone Encounter (Signed)
 Prescription refill request for Eliquis received. Indication:afib Last office visit:10/24 Scr:1.09  12/24 Age: 83 Weight:83.1  kg  Prescription refilled

## 2024-01-10 ENCOUNTER — Encounter: Payer: Medicare Other | Admitting: Cardiology

## 2024-01-16 ENCOUNTER — Encounter (HOSPITAL_COMMUNITY): Payer: Self-pay

## 2024-01-18 ENCOUNTER — Ambulatory Visit (HOSPITAL_COMMUNITY)
Admission: RE | Admit: 2024-01-18 | Discharge: 2024-01-18 | Disposition: A | Discharge status: Home / Self Care | Payer: Medicare Other | Source: Ambulatory Visit | Attending: Cardiology | Admitting: Cardiology

## 2024-01-18 DIAGNOSIS — I4819 Other persistent atrial fibrillation: Secondary | ICD-10-CM | POA: Diagnosis not present

## 2024-01-18 MED ORDER — IOHEXOL 350 MG/ML SOLN
100.0000 mL | Freq: Once | INTRAVENOUS | Status: AC | PRN
  Administered 2024-01-18: 100 mL via INTRAVENOUS

## 2024-01-19 ENCOUNTER — Other Ambulatory Visit: Payer: Self-pay

## 2024-01-19 DIAGNOSIS — I4819 Other persistent atrial fibrillation: Secondary | ICD-10-CM

## 2024-01-20 ENCOUNTER — Ambulatory Visit: Payer: Medicare Other | Attending: Cardiovascular Disease | Admitting: Cardiovascular Disease

## 2024-01-20 ENCOUNTER — Encounter: Payer: Self-pay | Admitting: Cardiovascular Disease

## 2024-01-20 VITALS — BP 126/80 | HR 67 | Ht 69.0 in | Wt 189.0 lb

## 2024-01-20 DIAGNOSIS — I350 Nonrheumatic aortic (valve) stenosis: Secondary | ICD-10-CM | POA: Diagnosis not present

## 2024-01-20 DIAGNOSIS — I2510 Atherosclerotic heart disease of native coronary artery without angina pectoris: Secondary | ICD-10-CM | POA: Diagnosis not present

## 2024-01-20 DIAGNOSIS — E782 Mixed hyperlipidemia: Secondary | ICD-10-CM | POA: Insufficient documentation

## 2024-01-20 DIAGNOSIS — I4819 Other persistent atrial fibrillation: Secondary | ICD-10-CM | POA: Diagnosis not present

## 2024-01-20 NOTE — Assessment & Plan Note (Signed)
Patient with upcoming ablation with Dr. Elberta Fortis.  He remains on apixaban for anticoagulation.

## 2024-01-20 NOTE — Progress Notes (Signed)
Cardiology Office Note:    Date:  01/20/2024   ID:  Derrick [name], [place] 1941/07/26, MRN [id number]  PCP:  Excell Seltzer, MD   [facility] Providers Cardiologist:  Tonny Bollman, MD Electrophysiologist:  Regan Lemming, MD     Referring MD: Excell Seltzer, MD   Chief Complaint  Patient presents with   Coronary Artery Disease    History of Present Illness:    Derrick [name] is a 83 y.o. 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 He's been in persistent afib for several months and he is scheduled for PVI with Dr Elberta Fortis next month. His primary symptom of afib is fatigue and exercise intolerance. He still walks for exercise but has to walk slower than in the past. No chest pain or pressure. No dyspnea, orthopnea, PND. He reports minimal leg swelling late in the day.   Current Medications: Current Meds  Medication Sig   amoxicillin (AMOXIL) 500 MG tablet TAKE 2 CAPSULES BY MOUTH ONE HOUR BEFORE APPOINTMENT AND 2 IMMEDIATELY AFTER THE APPOINTMENT   apixaban (ELIQUIS) 5 MG TABS tablet Take 1 tablet by mouth twice daily   bimatoprost (LUMIGAN) 0.01 % SOLN Place 1 drop into both eyes at bedtime.   Cholecalciferol 25 MCG (1000 UT) tablet Take 1,000 Units by mouth daily.    diclofenac Sodium (VOLTAREN) 1 % GEL Apply 2 g topically 4 (four) times daily as needed (pain).   dorzolamide-timolol (COSOPT) 22.3-6.8 MG/ML ophthalmic solution Place 1 drop into both eyes 2 (two) times daily.    finasteride (PROSCAR) 5  MG tablet Take 5 mg by mouth daily after breakfast.    fluticasone (FLONASE) 50 MCG/ACT nasal spray Place 2 sprays into both nostrils 2 (two) times daily.   Fluticasone Furoate (ARNUITY ELLIPTA) 100 MCG/ACT AEPB Inhale 1 puff into the lungs 2 (two) times daily.   Multiple Vitamin (MULTIVITAMIN WITH MINERALS) TABS tablet Take 1 tablet by mouth daily.   pantoprazole (PROTONIX) 40 MG tablet Take 1 tablet (40 mg total) by mouth daily.   Polyethyl Glycol-Propyl Glycol (SYSTANE OP) Place 1 drop into both eyes daily as needed (dry eyes).   rosuvastatin (CRESTOR) 20 MG tablet Take 1 tablet by mouth once daily   sacubitril-valsartan (ENTRESTO) 24-26 MG Take 1 tablet by mouth 2 (two) times daily.   tadalafil (CIALIS) 5 MG tablet Take 5 mg by mouth at bedtime.    triamcinolone cream (KENALOG) 0.1 % Apply 1 Application topically 2 (two) times daily.   [Paused] triamterene-hydrochlorothiazide (MAXZIDE-25) 37.5-25 MG tablet Take 1 tablet by mouth once daily   [DISCONTINUED] albuterol (VENTOLIN HFA) 108 (90 Base) MCG/ACT inhaler INHALE 2 PUFFS BY MOUTH EVERY 6 HOURS AS NEEDED FOR WHEEZING FOR SHORTNESS OF BREATH     Allergies:   Nsaids   ROS:   Please see the history of present illness.    All other systems reviewed and are negative.  EKGs/Labs/Other Studies Reviewed:    The following studies were reviewed today: Cardiac Studies & Procedures   CARDIAC CATHETERIZATION  CARDIAC CATHETERIZATION 05/15/2019  Narrative  Previously  placed Prox RCA to Mid RCA stent (unknown type) is widely patent.  Dist RCA lesion is 50% stenosed.  Mid LM to Ost LAD lesion is 70% stenosed.  Prox LAD lesion is 60% stenosed.  1. Severe distal left main stenosis, hemodynamically significant by DFR analysis 2. Moderate proximal LAD stenosis 3. Continued patency of the RCA stents 4. Nonobstructive LCx stenosis 5. Mild aortic stenosis with peak-to-peak gradient < 10 mmHg by pullback  Recommend: cardiac surgical  consultation for consideration of CABG in this patient with symptomatic left main disease. Last echo 09/2018 should be updated to reevaluate severity of aortic stenosis  Findings Coronary Findings Diagnostic  Dominance: Right  Left Main The left main has 60 to 70% distal vessel stenosis with heavy calcification. DFR is performed and the resting DFR of the left main is 0.84 (ischemic threshold is 0.89). Mid LM to Ost LAD lesion is 70% stenosed. The lesion is eccentric. The lesion is calcified.  Left Anterior Descending The LAD is diffusely diseased.  The proximal vessel has moderate calcific stenosis. Prox LAD lesion is 60% stenosed. The lesion is calcified.  Right Coronary Artery Previously placed Prox RCA to Mid RCA stent (unknown type) is widely patent. The RCA stents are widely patent with no significant in-stent restenosis Dist RCA lesion is 50% stenosed.  Intervention  No interventions have been documented.   STRESS TESTS  MYOCARDIAL PERFUSION IMAGING 05/04/2023  Narrative   Findings are consistent with a small area of ischemia in the basal inferior wall. The study is low risk.   No ST deviation was noted.   LV perfusion is abnormal. Defect 1: There is a small defect with mild reduction in uptake present in the basal inferior location(s) that is reversible. There is normal wall motion in the defect area. Consistent with ischemia.   Left ventricular function is normal. Nuclear stress EF: 62 %. The left ventricular ejection fraction is normal (55-65%). End diastolic cavity size is normal.   Prior study available for comparison from 05/29/2018.  ECHOCARDIOGRAM  ECHOCARDIOGRAM COMPLETE 11/10/2023  Narrative ECHOCARDIOGRAM REPORT    Patient Name:   Derrick [name] Date of Exam: 11/10/2023 Medical Rec #:  [id number]         Height:       69.0 in Accession #:    [id number]        Weight:       189.0 lb Date of Birth:  Dec 15, 1941          BSA:          2.017 m Patient Age:    82  years          BP:           130/50 mmHg Patient Gender: M                 HR:           72 bpm. Exam Location:  [address]  Procedure: 2D Echo, Cardiac Doppler, Color Doppler, 3D Echo and Strain Analysis  Indications:    I35.0 AS  History:        Patient has prior history of Echocardiogram examinations, most recent 12/01/2022. CAD, Prior CABG, COPD, AS, Arrythmias:NSVT and Atrial Fibrillation, Signs/Symptoms:MR, Risk Factors:Dyslipidemia and Hypertension.; Medications:SSS.  Sonographer:    Samule Ohm RDCS Referring Phys: [phone] Chaia Ikard  IMPRESSIONS   1. Left ventricular ejection fraction, by estimation, is 55 to 60%. The left ventricle has normal function. The left ventricle has no  regional wall motion abnormalities. Left ventricular diastolic parameters are indeterminate. The average left ventricular global longitudinal strain is 15.0 %. The global longitudinal strain is abnormal. 2. Right ventricular systolic function is mildly reduced. The right ventricular size is normal. There is mildly elevated pulmonary artery systolic pressure. The estimated right ventricular systolic pressure is 39.1 mmHg. 3. Left atrial size was severely dilated. 4. Right atrial size was moderately dilated. 5. The mitral valve is degenerative. Moderate mitral valve regurgitation. No evidence of mitral stenosis. 6. The aortic valve is tricuspid. There is severe calcifcation of the aortic valve. Aortic valve regurgitation is trivial. Moderate to severe aortic valve stenosis. Aortic valve area, by VTI measures 0.97 cm. Aortic valve mean gradient measures 25.0 mmHg. Possible paradoxical low flow/low gradient severe aortic stenosis though borderline (calculated AVA 1.0 cm^2 but dimensionless index 0.34). 7. Aortic dilatation noted. There is mild dilatation of the ascending aorta, measuring 42 mm. 8. The inferior vena cava is dilated in size with >50% respiratory variability, suggesting right atrial  pressure of 8 mmHg.  FINDINGS Left Ventricle: Left ventricular ejection fraction, by estimation, is 55 to 60%. The left ventricle has normal function. The left ventricle has no regional wall motion abnormalities. The average left ventricular global longitudinal strain is 15.0 %. The global longitudinal strain is abnormal. The left ventricular internal cavity size was normal in size. There is no left ventricular hypertrophy. Left ventricular diastolic parameters are indeterminate.  Right Ventricle: The right ventricular size is normal. No increase in right ventricular wall thickness. Right ventricular systolic function is mildly reduced. There is mildly elevated pulmonary artery systolic pressure. The tricuspid regurgitant velocity is 2.79 m/s, and with an assumed right atrial pressure of 8 mmHg, the estimated right ventricular systolic pressure is 39.1 mmHg.  Left Atrium: Left atrial size was severely dilated.  Right Atrium: Right atrial size was moderately dilated.  Pericardium: There is no evidence of pericardial effusion.  Mitral Valve: The mitral valve is degenerative in appearance. There is moderate calcification of the mitral valve leaflet(s). Mild mitral annular calcification. Moderate mitral valve regurgitation. No evidence of mitral valve stenosis.  Tricuspid Valve: The tricuspid valve is normal in structure. Tricuspid valve regurgitation is mild.  Aortic Valve: The aortic valve is tricuspid. There is severe calcifcation of the aortic valve. Aortic valve regurgitation is trivial. Aortic regurgitation PHT measures 651 msec. Moderate to severe aortic stenosis is present. Aortic valve mean gradient measures 25.0 mmHg. Aortic valve peak gradient measures 43.4 mmHg. Aortic valve area, by VTI measures 0.97 cm.  Pulmonic Valve: The pulmonic valve was normal in structure. Pulmonic valve regurgitation is not visualized.  Aorta: The aortic root is normal in size and structure and aortic  dilatation noted. There is mild dilatation of the ascending aorta, measuring 42 mm.  Venous: The inferior vena cava is dilated in size with greater than 50% respiratory variability, suggesting right atrial pressure of 8 mmHg.  IAS/Shunts: No atrial level shunt detected by color flow Doppler.  Additional Comments: A device lead is visualized in the right ventricle.   LEFT VENTRICLE PLAX 2D LVIDd:         4.80 cm   Diastology LVIDs:         3.40 cm   LV e' medial:    8.49 cm/s LV PW:         1.20 cm   LV E/e' medial:  16.8 LV IVS:        0.90 cm   LV e' lateral:  7.07 cm/s LVOT diam:     2.00 cm   LV E/e' lateral: 20.2 LV SV:         79 LV SV Index:   39        2D Longitudinal Strain LVOT Area:     3.14 cm  2D Strain GLS (A2C):   14.9 % 2D Strain GLS (A3C):   15.6 % 2D Strain GLS (A4C):   14.6 % 2D Strain GLS Avg:     15.0 %  3D Volume EF: 3D EF:        55 % LV EDV:       184 ml LV ESV:       83 ml LV SV:        101 ml  RIGHT VENTRICLE             IVC RV S prime:     10.30 cm/s  IVC diam: 2.50 cm TAPSE (M-mode): 1.3 cm RVSP:           34.1 mmHg  LEFT ATRIUM              Index        RIGHT ATRIUM           Index LA diam:        5.80 cm  2.88 cm/m   RA Pressure: 3.00 mmHg LA Vol (A2C):   106.0 ml 52.56 ml/m  RA Area:     25.40 cm LA Vol (A4C):   104.0 ml 51.56 ml/m  RA Volume:   80.00 ml  39.66 ml/m LA Biplane Vol: 116.0 ml 57.51 ml/m AORTIC VALVE AV Area (Vmax):    1.10 cm AV Area (Vmean):   1.01 cm AV Area (VTI):     0.97 cm AV Vmax:           329.50 cm/s AV Vmean:          226.000 cm/s AV VTI:            0.814 m AV Peak Grad:      43.4 mmHg AV Mean Grad:      25.0 mmHg LVOT Vmax:         115.00 cm/s LVOT Vmean:        72.400 cm/s LVOT VTI:          0.252 m LVOT/AV VTI ratio: 0.31 AI PHT:            651 msec  AORTA Ao Root diam: 3.20 cm Ao Asc diam:  4.20 cm  MITRAL VALVE                TRICUSPID VALVE MV Area (PHT): 3.12 cm     TR Peak grad:    31.1 mmHg MV Decel Time: 243 msec     TR Vmax:        279.00 cm/s MV E velocity: 143.00 cm/s  Estimated RAP:  3.00 mmHg MV A velocity: 32.30 cm/s   RVSP:           34.1 mmHg MV E/A ratio:  4.43 SHUNTS Systemic VTI:  0.25 m Systemic Diam: 2.00 cm  [name] McleanMD Electronically signed by Wilfred Lacy Signature Date/Time: 11/10/2023/10:39:04 AM    Final  TEE  ECHO INTRAOPERATIVE TEE 06/05/2019  Interpretation Summary   Tricuspid Valve: The tricuspid annulus is not dilated.   Left ventricle: Normal cavity size. Concentric hypertrophy of moderate severity.   Left atrium: Left atrial appendage filling and emptying velocities are normal.  Aortic valve: Moderate valve thickening present. Mild valve calcification present. Mild stenosis.   Aorta: The ascending aorta is mildly dilated.   Mitral valve:  Mild mitral annular calcification. Trace regurgitation.   Right ventricle:  Normal cavity size, wall thickness and ejection fraction.   Right atrium:  Pacer wire present in the right atrium.   Tricuspid valve: Trace regurgitation.            EKG:        Recent Labs: 12/14/2023: ALT 31; BUN 23; Creatinine, Ser 1.09; Hemoglobin 13.3; Magnesium 1.6; Platelets 194.0; Potassium 4.4; Sodium 137  Recent Lipid Panel    Component Value Date/Time   CHOL 138 11/04/2023 0719   CHOL 147 05/21/2020 0721   TRIG 57.0 11/04/2023 0719   HDL 54.80 11/04/2023 0719   HDL 66 05/21/2020 0721   CHOLHDL 3 11/04/2023 0719   VLDL 11.4 11/04/2023 0719   LDLCALC 71 11/04/2023 0719   LDLCALC 99 10/30/2021 1557     Risk Assessment/Calculations:    CHA2DS2-VASc Score = 5   This indicates a 7.2% annual risk of stroke. The patient's score is based upon: CHF History: 1 HTN History: 1 Diabetes History: 0 Stroke History: 0 Vascular Disease History: 1 Age Score: 2 Gender Score: 0               Physical Exam:    VS:  BP 126/80   Pulse 67   Ht 5\' 9"  (1.753 m)   Wt 189 lb (85.7 kg)   SpO2  99%   BMI 27.91 kg/m     Wt Readings from Last 3 Encounters:  01/20/24 189 lb (85.7 kg)  12/14/23 183 lb 3.2 oz (83.1 kg)  12/01/23 185 lb (83.9 kg)     GEN:  Well nourished, well developed in no acute distress HEENT: Normal NECK: No JVD; No carotid bruits LYMPHATICS: No lymphadenopathy CARDIAC: Irregular, 2/6 harsh mid peaking systolic murmur at the right upper sternal border, A2 is preserved RESPIRATORY:  Clear to auscultation without rales, wheezing or rhonchi  ABDOMEN: Soft, non-tender, non-distended MUSCULOSKELETAL:  No edema; No deformity  SKIN: Warm and dry NEUROLOGIC:  Alert and oriented x 3 PSYCHIATRIC:  Normal affect   Assessment & Plan Persistent atrial fibrillation [facility]) Patient with upcoming ablation with Dr. Elberta Fortis.  He remains on apixaban for anticoagulation. Coronary artery disease involving native coronary artery of native heart without angina pectoris Patient is stable with no symptoms of angina.  He no longer requires antiplatelet therapy.  He remains on a high intensity statin drug. Moderate aortic stenosis His echo and physical exam are both consistent with moderate aortic stenosis.  There is a comment regarding the possibility of paradoxical low-flow low gradient aortic stenosis on his echo, but all of the indices are consistent with moderate aortic stenosis (SVI greater than 35, dimensionless index greater than 0.25, mean gradient less than 40, LVEF preserved) continue clinical follow-up.  We discussed the cardinal signs of progressive aortic stenosis and he understands.  Repeat echocardiogram 1 year at the time of his return office visit. Mixed hyperlipidemia Treated with rosuvastatin 20 mg daily.  LDL cholesterol 71.      Medication Adjustments/Labs and Tests Ordered: Current medicines are reviewed at length with the patient today.  Concerns regarding medicines are outlined above.  Orders Placed This Encounter  Procedures   ECHOCARDIOGRAM COMPLETE    No orders of the defined types were placed in this encounter.   Patient Instructions  Testing/Procedures: Echo prior to next  appointment   Your physician has requested that you have an echocardiogram. Echocardiography is a painless test that uses sound waves to create images of your heart. It provides your doctor with information about the size and shape of your heart and how well your heart's chambers and valves are working. This procedure takes approximately one hour. There are no restrictions for this procedure. Please do NOT wear cologne, perfume, aftershave, or lotions (deodorant is allowed). Please arrive 15 minutes prior to your appointment time.  Please note: We ask at that you not bring children with you during ultrasound (echo/ vascular) testing. Due to room size and safety concerns, children are not allowed in the ultrasound rooms during exams. Our front office staff cannot provide observation of children in our lobby area while testing is being conducted. An adult accompanying a patient to their appointment will only be allowed in the ultrasound room at the discretion of the ultrasound technician under special circumstances. We apologize for any inconvenience.    Follow-Up: At [facility], you and your health needs are our priority.  As part of our continuing mission to provide you with exceptional heart care, we have created designated Provider Care Teams.  These Care Teams include your primary Cardiologist (physician) and Advanced Practice Providers (APPs -  Physician Assistants and Nurse Practitioners) who all work together to provide you with the care you need, when you need it.  Your next appointment:   1 year(s)  Provider:   Tonny Bollman, MD     Other Instructions   1st Floor: - Lobby - Registration  - Pharmacy  - Lab - Cafe  2nd Floor: - PV Lab - Diagnostic Testing (echo, CT, nuclear med)  3rd Floor: - Vacant  4th Floor: - TCTS  (cardiothoracic surgery) - AFib Clinic - Structural Heart Clinic - Vascular Surgery  - Vascular Ultrasound  5th Floor: - HeartCare Cardiology (general and EP) - Clinical Pharmacy for coumadin, hypertension, lipid, weight-loss medications, and med management appointments    Valet parking services will be available as well.          Signed, Tonny Bollman, MD  01/20/2024 1:19 PM     [facility]

## 2024-01-20 NOTE — Patient Instructions (Signed)
Testing/Procedures: Echo prior to next appointment   Your physician has requested that you have an echocardiogram. Echocardiography is a painless test that uses sound waves to create images of your heart. It provides your doctor with information about the size and shape of your heart and how well your heart's chambers and valves are working. This procedure takes approximately one hour. There are no restrictions for this procedure. Please do NOT wear cologne, perfume, aftershave, or lotions (deodorant is allowed). Please arrive 15 minutes prior to your appointment time.  Please note: We ask at that you not bring children with you during ultrasound (echo/ vascular) testing. Due to room size and safety concerns, children are not allowed in the ultrasound rooms during exams. Our front office staff cannot provide observation of children in our lobby area while testing is being conducted. An adult accompanying a patient to their appointment will only be allowed in the ultrasound room at the discretion of the ultrasound technician under special circumstances. We apologize for any inconvenience.    Follow-Up: At [facility], you and your health needs are our priority.  As part of our continuing mission to provide you with exceptional heart care, we have created designated Provider Care Teams.  These Care Teams include your primary Cardiologist (physician) and Advanced Practice Providers (APPs -  Physician Assistants and Nurse Practitioners) who all work together to provide you with the care you need, when you need it.  Your next appointment:   1 year(s)  Provider:   Tonny Bollman, MD     Other Instructions   1st Floor: - Lobby - Registration  - Pharmacy  - Lab - Cafe  2nd Floor: - PV Lab - Diagnostic Testing (echo, CT, nuclear med)  3rd Floor: - Vacant  4th Floor: - TCTS (cardiothoracic surgery) - AFib Clinic - Structural Heart Clinic - Vascular Surgery  - Vascular  Ultrasound  5th Floor: - HeartCare Cardiology (general and EP) - Clinical Pharmacy for coumadin, hypertension, lipid, weight-loss medications, and med management appointments    Valet parking services will be available as well.

## 2024-01-21 LAB — BASIC METABOLIC PANEL
BUN/Creatinine Ratio: 17 (ref 10–24)
BUN: 20 mg/dL (ref 8–27)
CO2: 21 mmol/L (ref 20–29)
Calcium: 9.9 mg/dL (ref 8.6–10.2)
Chloride: 104 mmol/L (ref 96–106)
Creatinine, Ser: 1.21 mg/dL (ref 0.76–1.27)
Glucose: 88 mg/dL (ref 70–99)
Potassium: 4.6 mmol/L (ref 3.5–5.2)
Sodium: 141 mmol/L (ref 134–144)
eGFR: 61 mL/min/{1.73_m2} (ref >59)

## 2024-01-21 LAB — CBC
Hematocrit: 36.3 % — ABNORMAL LOW (ref 37.5–51.0)
Hemoglobin: 11.9 g/dL — ABNORMAL LOW (ref 13.0–17.7)
MCH: 31.1 pg (ref 26.6–33.0)
MCHC: 32.8 g/dL (ref 31.5–35.7)
MCV: 95 fL (ref 79–97)
Platelets: 148 10*3/uL — ABNORMAL LOW (ref 150–450)
RBC: 3.83 x10E6/uL — ABNORMAL LOW (ref 4.14–5.80)
RDW: 12.9 % (ref 11.6–15.4)
WBC: 3.9 10*3/uL (ref 3.4–10.8)

## 2024-01-23 ENCOUNTER — Encounter: Payer: Self-pay | Admitting: Family Medicine

## 2024-01-24 ENCOUNTER — Telehealth: Payer: Self-pay | Admitting: Family Medicine

## 2024-01-24 NOTE — Telephone Encounter (Signed)
Mr. Galea notified by telephone that handicap placard application is ready to be picked up at the front desk.

## 2024-01-24 NOTE — Telephone Encounter (Signed)
Type of forms received: handicap parking placard   Routed to: bed[employer]'s pool   Paperwork received by : Audree Camel   Individual made aware of 3-5 business day turn around (Y/N): Y   Form completed and patient made aware of charges(Y/N): Y    Faxed to : pt requested call back once ppw is comp @ [phone]  Form location:  [employer]'s folder

## 2024-01-24 NOTE — Telephone Encounter (Signed)
Form completed and in outbox

## 2024-01-24 NOTE — Progress Notes (Signed)
Reviewed

## 2024-01-25 NOTE — Telephone Encounter (Signed)
Patient came by and picked up form

## 2024-01-26 ENCOUNTER — Encounter: Payer: Self-pay | Admitting: Family Medicine

## 2024-01-26 NOTE — Progress Notes (Signed)
Remote pacemaker transmission.

## 2024-01-26 NOTE — Addendum Note (Signed)
Addended by: Elease Etienne A on: 01/26/2024 01:53 PM   Modules accepted: Orders

## 2024-02-03 ENCOUNTER — Telehealth: Payer: Self-pay

## 2024-02-03 NOTE — Telephone Encounter (Signed)
 Pt's Afib Ablation with Dr. Lawana Pray has been moved up from 2/18 to 2/11 at 10:00 AM.   Pt is aware to arrive at 8:00 AM.

## 2024-02-06 NOTE — Pre-Procedure Instructions (Signed)
 Instructed patient on the following items: Arrival time 0800 Nothing to eat or drink after midnight No meds AM of procedure Responsible person to drive you home and stay with you for 24 hrs  Have you missed any doses of anti-coagulant- Eliquis- takes twice a day, hasn't missed any doses.  Don't take dose in the morning.

## 2024-02-07 ENCOUNTER — Ambulatory Visit (HOSPITAL_COMMUNITY): Payer: Medicare Other

## 2024-02-07 ENCOUNTER — Ambulatory Visit (HOSPITAL_COMMUNITY)
Admission: RE | Admit: 2024-02-07 | Discharge: 2024-02-07 | Disposition: A | Discharge status: Home / Self Care | Payer: Medicare Other | Source: Ambulatory Visit | Attending: Cardiology | Admitting: Cardiology

## 2024-02-07 ENCOUNTER — Encounter (HOSPITAL_COMMUNITY): Payer: Self-pay | Admitting: Cardiology

## 2024-02-07 ENCOUNTER — Other Ambulatory Visit: Payer: Self-pay

## 2024-02-07 ENCOUNTER — Encounter (HOSPITAL_COMMUNITY)
Admission: RE | Disposition: A | Discharge status: Home / Self Care | Payer: Medicare Other | Source: Ambulatory Visit | Attending: Cardiology

## 2024-02-07 ENCOUNTER — Encounter: Payer: Self-pay | Admitting: Cardiology

## 2024-02-07 ENCOUNTER — Ambulatory Visit (HOSPITAL_BASED_OUTPATIENT_CLINIC_OR_DEPARTMENT_OTHER): Payer: Medicare Other

## 2024-02-07 DIAGNOSIS — K219 Gastro-esophageal reflux disease without esophagitis: Secondary | ICD-10-CM | POA: Diagnosis not present

## 2024-02-07 DIAGNOSIS — I495 Sick sinus syndrome: Secondary | ICD-10-CM | POA: Diagnosis not present

## 2024-02-07 DIAGNOSIS — I4819 Other persistent atrial fibrillation: Secondary | ICD-10-CM | POA: Diagnosis not present

## 2024-02-07 DIAGNOSIS — G4733 Obstructive sleep apnea (adult) (pediatric): Secondary | ICD-10-CM

## 2024-02-07 DIAGNOSIS — J449 Chronic obstructive pulmonary disease, unspecified: Secondary | ICD-10-CM | POA: Diagnosis not present

## 2024-02-07 DIAGNOSIS — Z951 Presence of aortocoronary bypass graft: Secondary | ICD-10-CM | POA: Diagnosis not present

## 2024-02-07 DIAGNOSIS — D860 Sarcoidosis of lung: Secondary | ICD-10-CM | POA: Diagnosis not present

## 2024-02-07 DIAGNOSIS — I5030 Unspecified diastolic (congestive) heart failure: Secondary | ICD-10-CM | POA: Diagnosis not present

## 2024-02-07 DIAGNOSIS — I4891 Unspecified atrial fibrillation: Secondary | ICD-10-CM | POA: Diagnosis not present

## 2024-02-07 DIAGNOSIS — I2510 Atherosclerotic heart disease of native coronary artery without angina pectoris: Secondary | ICD-10-CM

## 2024-02-07 DIAGNOSIS — I509 Heart failure, unspecified: Secondary | ICD-10-CM | POA: Insufficient documentation

## 2024-02-07 DIAGNOSIS — Z86711 Personal history of pulmonary embolism: Secondary | ICD-10-CM | POA: Diagnosis not present

## 2024-02-07 DIAGNOSIS — Z950 Presence of cardiac pacemaker: Secondary | ICD-10-CM | POA: Insufficient documentation

## 2024-02-07 DIAGNOSIS — D469 Myelodysplastic syndrome, unspecified: Secondary | ICD-10-CM | POA: Insufficient documentation

## 2024-02-07 DIAGNOSIS — I110 Hypertensive heart disease with heart failure: Secondary | ICD-10-CM | POA: Insufficient documentation

## 2024-02-07 HISTORY — PX: ATRIAL FIBRILLATION ABLATION: EP1191

## 2024-02-07 LAB — POCT ACTIVATED CLOTTING TIME: Activated Clotting Time: 320 s

## 2024-02-07 SURGERY — ATRIAL FIBRILLATION ABLATION
Anesthesia: General

## 2024-02-07 MED ORDER — ROCURONIUM BROMIDE 10 MG/ML (PF) SYRINGE
PREFILLED_SYRINGE | INTRAVENOUS | Status: DC | PRN
Start: 2024-02-07 — End: 2024-02-07
  Administered 2024-02-07: 80 mg via INTRAVENOUS

## 2024-02-07 MED ORDER — PROPOFOL 10 MG/ML IV BOLUS
INTRAVENOUS | Status: DC | PRN
  Administered 2024-02-07: 130 mg via INTRAVENOUS
  Administered 2024-02-07: 30 mg via INTRAVENOUS

## 2024-02-07 MED ORDER — PROTAMINE SULFATE 10 MG/ML IV SOLN
INTRAVENOUS | Status: DC | PRN
  Administered 2024-02-07: 40 mg via INTRAVENOUS

## 2024-02-07 MED ORDER — PHENYLEPHRINE HCL-NACL 20-0.9 MG/250ML-% IV SOLN
INTRAVENOUS | Status: DC | PRN
  Administered 2024-02-07: 20 ug/min via INTRAVENOUS

## 2024-02-07 MED ORDER — FENTANYL CITRATE (PF) 100 MCG/2ML IJ SOLN
INTRAMUSCULAR | Status: AC
  Filled 2024-02-07: qty 2

## 2024-02-07 MED ORDER — SODIUM CHLORIDE 0.9 % IV SOLN: 250.0000 mL | INTRAVENOUS | Status: DC | PRN

## 2024-02-07 MED ORDER — CEFAZOLIN SODIUM-DEXTROSE 2-4 GM/100ML-% IV SOLN
INTRAVENOUS | Status: AC
  Filled 2024-02-07: qty 100

## 2024-02-07 MED ORDER — ONDANSETRON HCL 4 MG/2ML IJ SOLN
INTRAMUSCULAR | Status: DC | PRN
  Administered 2024-02-07: 4 mg via INTRAVENOUS

## 2024-02-07 MED ORDER — FENTANYL CITRATE (PF) 250 MCG/5ML IJ SOLN
INTRAMUSCULAR | Status: DC | PRN
  Administered 2024-02-07 (×2): 50 ug via INTRAVENOUS

## 2024-02-07 MED ORDER — HEPARIN (PORCINE) IN NACL 1000-0.9 UT/500ML-% IV SOLN
INTRAVENOUS | Status: DC | PRN
  Administered 2024-02-07 (×3): 500 mL

## 2024-02-07 MED ORDER — SODIUM CHLORIDE 0.9% FLUSH: 3.0000 mL | INTRAVENOUS | Status: DC | PRN

## 2024-02-07 MED ORDER — SUGAMMADEX SODIUM 200 MG/2ML IV SOLN
INTRAVENOUS | Status: DC | PRN
  Administered 2024-02-07: 330 mg via INTRAVENOUS
  Administered 2024-02-07: 70 mg via INTRAVENOUS

## 2024-02-07 MED ORDER — ACETAMINOPHEN 325 MG PO TABS: 650.0000 mg | ORAL_TABLET | ORAL | Status: DC | PRN

## 2024-02-07 MED ORDER — DEXAMETHASONE SODIUM PHOSPHATE 10 MG/ML IJ SOLN
INTRAMUSCULAR | Status: DC | PRN
  Administered 2024-02-07: 10 mg via INTRAVENOUS

## 2024-02-07 MED ORDER — ONDANSETRON HCL 4 MG/2ML IJ SOLN: 4.0000 mg | Freq: Four times a day (QID) | INTRAMUSCULAR | Status: DC | PRN

## 2024-02-07 MED ORDER — SODIUM CHLORIDE 0.9% FLUSH: 3.0000 mL | Freq: Two times a day (BID) | INTRAVENOUS | Status: DC

## 2024-02-07 MED ORDER — PROPOFOL 500 MG/50ML IV EMUL
INTRAVENOUS | Status: DC | PRN
  Administered 2024-02-07: 115 ug/kg/min via INTRAVENOUS

## 2024-02-07 MED ORDER — ATROPINE SULFATE 1 MG/10ML IJ SOSY
PREFILLED_SYRINGE | INTRAMUSCULAR | Status: AC
Start: 2024-02-07 — End: ?
  Filled 2024-02-07: qty 10

## 2024-02-07 MED ORDER — SODIUM CHLORIDE 0.9 % IV SOLN: INTRAVENOUS | Status: DC

## 2024-02-07 MED ORDER — LIDOCAINE 2% (20 MG/ML) 5 ML SYRINGE
INTRAMUSCULAR | Status: DC | PRN
  Administered 2024-02-07: 80 mg via INTRAVENOUS

## 2024-02-07 MED ORDER — CEFAZOLIN SODIUM-DEXTROSE 2-3 GM-%(50ML) IV SOLR
INTRAVENOUS | Status: DC | PRN
  Administered 2024-02-07: 2 g via INTRAVENOUS

## 2024-02-07 MED ORDER — HEPARIN SODIUM (PORCINE) 1000 UNIT/ML IJ SOLN
INTRAMUSCULAR | Status: DC | PRN
  Administered 2024-02-07: 14000 [IU] via INTRAVENOUS
  Administered 2024-02-07: 2000 [IU] via INTRAVENOUS

## 2024-02-07 MED ORDER — PHENYLEPHRINE 80 MCG/ML (10ML) SYRINGE FOR IV PUSH (FOR BLOOD PRESSURE SUPPORT)
PREFILLED_SYRINGE | INTRAVENOUS | Status: DC | PRN
Start: 2024-02-07 — End: 2024-02-07
  Administered 2024-02-07: 80 ug via INTRAVENOUS

## 2024-02-07 SURGICAL SUPPLY — 20 items
BAG SNAP BAND KOVER 36X36 (MISCELLANEOUS) IMPLANT
CABLE PFA RX CATH CONN (CABLE) IMPLANT
CATH 8FR REPROCESSED SOUNDSTAR (CATHETERS) ×1
CATH 8FR SOUNDSTAR REPROCESSED (CATHETERS) IMPLANT
CATH FARAWAVE ABLATION 31 (CATHETERS) IMPLANT
CATH OCTARAY 2.0 F 3-3-3-3-3 (CATHETERS) IMPLANT
CATH WEBSTER BI DIR CS D-F CRV (CATHETERS) IMPLANT
CLOSURE MYNX CONTROL 6F/7F (Vascular Products) IMPLANT
CLOSURE PERCLOSE PROSTYLE (VASCULAR PRODUCTS) IMPLANT
COVER SWIFTLINK CONNECTOR (BAG) ×1 IMPLANT
DILATOR VESSEL 38 20CM 16FR (INTRODUCER) IMPLANT
GUIDEWIRE INQWIRE 1.5J.035X260 (WIRE) IMPLANT
INQWIRE 1.5J .035X260CM (WIRE) ×1
KIT VERSACROSS CNCT FARADRIVE (KITS) IMPLANT
PACK EP LF (CUSTOM PROCEDURE TRAY) ×1 IMPLANT
PAD DEFIB RADIO PHYSIO CONN (PAD) ×1 IMPLANT
SHEATH FARADRIVE STEERABLE (SHEATH) IMPLANT
SHEATH PINNACLE 8F 10CM (SHEATH) IMPLANT
SHEATH PINNACLE 9F 10CM (SHEATH) IMPLANT
SHEATH PROBE COVER 6X72 (BAG) IMPLANT

## 2024-02-07 NOTE — Anesthesia Procedure Notes (Signed)
Procedure Name: Intubation Date/Time: 02/07/2024 9:48 AM  Performed by: Kayleen Memos, CRNAPre-anesthesia Checklist: Patient identified, Emergency Drugs available, Suction available and Patient being monitored Patient Re-evaluated:Patient Re-evaluated prior to induction Oxygen Delivery Method: Circle System Utilized Preoxygenation: Pre-oxygenation with 100% oxygen Induction Type: IV induction Ventilation: Mask ventilation without difficulty and Oral airway inserted - appropriate to patient size Laryngoscope Size: 3 and Glidescope Grade View: Grade I Tube type: Oral Tube size: 7.5 mm Number of attempts: 1 Airway Equipment and Method: Stylet and Oral airway Placement Confirmation: ETT inserted through vocal cords under direct vision, positive ETCO2 and breath sounds checked- equal and bilateral Secured at: 26 cm Tube secured with: Tape Dental Injury: Teeth and Oropharynx as per pre-operative assessment

## 2024-02-07 NOTE — Transfer of Care (Signed)
Immediate Anesthesia Transfer of Care Note  Patient: Derrick [name]  Procedure(s) Performed: ATRIAL FIBRILLATION ABLATION  Patient Location: PACU  Anesthesia Type:General  Level of Consciousness: awake, alert , and oriented  Airway & Oxygen Therapy: Patient Spontanous Breathing and Patient connected to nasal cannula oxygen  Post-op Assessment: Report given to RN, Post -op Vital signs reviewed and stable, and Patient moving all extremities  Post vital signs: Reviewed and stable  Last Vitals:  Vitals Value Taken Time  BP 96/61 02/07/24 1109  Temp    Pulse 60 02/07/24 1115  Resp 20 02/07/24 1115  SpO2 93 % 02/07/24 1115  Vitals shown include unfiled device data.  Last Pain:  Vitals:   02/07/24 1109  TempSrc:   PainSc: 0-No pain         Complications: There were no known notable events for this encounter.

## 2024-02-07 NOTE — Discharge Instructions (Addendum)

## 2024-02-07 NOTE — H&P (Signed)
  Electrophysiology Office Note:   Date:  02/07/2024  ID:  Derrick [name], [place] Apr 30, 1941, MRN [id number]  Primary Cardiologist: Tonny Bollman, MD Electrophysiologist: Regan Lemming, MD      History of Present Illness:   Derrick [name] is a 83 y.o. male with h/o coronary artery disease post CABG, PE, pulmonary sarcoidosis, MDS, sick sinus syndrome, atrial fibrillation seen today for routine electrophysiology followup.   Today, denies symptoms of palpitations, chest pain, shortness of breath, orthopnea, PND, lower extremity edema, claudication, dizziness, presyncope, syncope, bleeding, or neurologic sequela. The patient is tolerating medications without difficulties. Plan ablation today.     EP Information / Studies Reviewed:    EKG is not ordered today. EKG from 09/29/23 reviewed which showed AF, V paced      PPM Interrogation-  reviewed in detail today,  See PACEART report.  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 The patient's score is based upon: CHF History: 1 HTN History: 1 Diabetes History: 0 Stroke History: 0 Vascular Disease History: 1 Age Score: 2 Gender Score: 0             Physical Exam:   VS:  BP 128/62   Pulse 60   Temp 98.7 F (37.1 C) (Oral)   Resp 17   Ht 5\' 9"  (1.753 m)   Wt 82.6 kg   SpO2 96%   BMI 26.88 kg/m    Wt Readings from Last 3 Encounters:  02/07/24 82.6 kg  01/20/24 85.7 kg  12/14/23 83.1 kg     GEN: No acute distress.   Neck: No JVD Cardiac: RRR, no murmurs, rubs, or gallops.  Respiratory: decreased BS bases bilaterally. GI: Soft, nontender, non-distended  MS: No edema; No deformity. Neuro:  Nonfocal  Skin: warm and dry,  Psych: Normal affect     ASSESSMENT AND PLAN:    SND s/p Medtronic PPM  Normal PPM function See Pace Art report No changes today  2.  Persistent  atrial fibrillation: Demond Shallenberger has presented today for surgery, with the diagnosis of AF.  The various methods of treatment have been discussed with the patient and family. After consideration of risks, benefits and other options for treatment, the patient has consented to  Procedure(s): Catheter ablation as a surgical intervention .  Risks include but not limited to complete heart block, stroke, esophageal damage, nerve damage, bleeding, vascular damage, tamponade, perforation, MI, and death. The patient's history has been reviewed, patient examined, no change in status, stable for surgery.  I have reviewed the patient's chart and labs.  Questions were answered to the patient's satisfaction.    Zarif Rathje Elberta Fortis, MD 02/07/2024 8:42 AM

## 2024-02-07 NOTE — Anesthesia Preprocedure Evaluation (Signed)
Anesthesia Evaluation  Patient identified by MRN, date of birth, ID band Patient awake    Reviewed: Allergy & Precautions, NPO status , Patient's Chart, lab work & pertinent test results  History of Anesthesia Complications Negative for: history of anesthetic complications  Airway Mallampati: IV  TM Distance: >3 FB Neck ROM: Full    Dental  (+) Dental Advisory Given, Teeth Intact   Pulmonary neg shortness of breath, sleep apnea and Continuous Positive Airway Pressure Ventilation , COPD,  COPD inhaler, neg recent URI   breath sounds clear to auscultation       Cardiovascular hypertension, Pt. on medications (-) angina + CAD, + Past MI, + Cardiac Stents, + CABG and +CHF  + pacemaker + Valvular Problems/Murmurs AS  Rhythm:Regular + Systolic murmurs  1. Left ventricular ejection fraction, by estimation, is 55 to 60%. The  left ventricle has normal function. The left ventricle has no regional  wall motion abnormalities. Left ventricular diastolic parameters are  indeterminate. The average left  ventricular global longitudinal strain is 15.0 %. The global longitudinal  strain is abnormal.   2. Right ventricular systolic function is mildly reduced. The right  ventricular size is normal. There is mildly elevated pulmonary artery  systolic pressure. The estimated right ventricular systolic pressure is  39.1 mmHg.   3. Left atrial size was severely dilated.   4. Right atrial size was moderately dilated.   5. The mitral valve is degenerative. Moderate mitral valve regurgitation.  No evidence of mitral stenosis.   6. The aortic valve is tricuspid. There is severe calcifcation of the  aortic valve. Aortic valve regurgitation is trivial. Moderate to severe  aortic valve stenosis. Aortic valve area, by VTI measures 0.97 cm. Aortic  valve mean gradient measures 25.0  mmHg. Possible paradoxical low flow/low gradient severe aortic stenosis   though borderline (calculated AVA 1.0 cm^2 but dimensionless index 0.34).   7. Aortic dilatation noted. There is mild dilatation of the ascending  aorta, measuring 42 mm.   8. The inferior vena cava is dilated in size with >50% respiratory  variability, suggesting right atrial pressure of 8 mmHg.   2023 stress   Findings are consistent with a small area of ischemia in the basal inferior wall. The study is low risk.   No ST deviation was noted.   LV perfusion is abnormal. Defect 1: There is a small defect with mild reduction in uptake present in the basal inferior location(s) that is reversible. There is normal wall motion in the defect area. Consistent with ischemia.   Left ventricular function is normal. Nuclear stress EF: 62 %. The left ventricular ejection fraction is normal (55-65%). End diastolic cavity size is normal.   Prior study available for comparison from 05/29/2018.   2020 cath   Previously placed Prox RCA to Mid RCA stent (unknown type) is widely patent.  Dist RCA lesion is 50% stenosed.  Mid LM to Ost LAD lesion is 70% stenosed.  Prox LAD lesion is 60% stenosed.   1. Severe distal left main stenosis, hemodynamically significant by DFR analysis 2. Moderate proximal LAD stenosis 3. Continued patency of the RCA stents 4. Nonobstructive LCx stenosis 5. Mild aortic stenosis with peak-to-peak gradient < 10 mmHg by pullback   Recommend: cardiac surgical consultation for consideration of CABG in this patient with symptomatic left main disease. Last echo 09/2018 should be updated to reevaluate severity of aortic stenosis       Neuro/Psych neg Seizures  Neuromuscular disease  GI/Hepatic Neg liver ROS,GERD  Medicated and Controlled,,  Endo/Other    Renal/GU Renal diseaseLab Results      Component                Value               Date                      NA                       141                 01/20/2024                K                        4.6                  01/20/2024                CO2                      21                  01/20/2024                GLUCOSE                  88                  01/20/2024                BUN                      20                  01/20/2024                CREATININE               1.21                01/20/2024                CALCIUM                  9.9                 01/20/2024                GFR                      64.01               12/14/2023                EGFR                     61                  01/20/2024                GFRNONAA                 53 (L)              12/11/2023  Musculoskeletal  (+) Arthritis ,    Abdominal   Peds  Hematology  (+) Blood dyscrasia, anemia Lab Results      Component                Value               Date                      WBC                      3.9                 01/20/2024                HGB                      11.9 (L)            01/20/2024                HCT                      36.3 (L)            01/20/2024                MCV                      95                  01/20/2024                PLT                      148 (L)             01/20/2024             eliquis   Anesthesia Other Findings   Reproductive/Obstetrics                              Anesthesia Physical Anesthesia Plan  ASA: 3  Anesthesia Plan: General   Post-op Pain Management: Minimal or no pain anticipated   Induction: Intravenous  PONV Risk Score and Plan: 2 and Ondansetron, Dexamethasone, Propofol infusion and TIVA  Airway Management Planned: Oral ETT  Additional Equipment: None  Intra-op Plan:   Post-operative Plan: Extubation in OR  Informed Consent: I have reviewed the patients History and Physical, chart, labs and discussed the procedure including the risks, benefits and alternatives for the proposed anesthesia with the patient or authorized representative who has indicated his/her understanding and  acceptance.     Dental advisory given  Plan Discussed with: CRNA  Anesthesia Plan Comments:          Anesthesia Quick Evaluation

## 2024-02-08 ENCOUNTER — Telehealth (HOSPITAL_COMMUNITY): Payer: Self-pay

## 2024-02-08 MED FILL — Cefazolin Sodium-Dextrose IV Solution 2 GM/100ML-4%: INTRAVENOUS | Qty: 100 | Status: AC

## 2024-02-08 MED FILL — Fentanyl Citrate Preservative Free (PF) Inj 100 MCG/2ML: INTRAMUSCULAR | Qty: 2 | Status: AC

## 2024-02-08 NOTE — Telephone Encounter (Signed)
Spoke with patient to complete post procedure follow up call.  Patient reports no complications with groin sites.   Instructions reviewed with patient:  Remove large bandage at puncture site after 24 hours. It is normal to have bruising, tenderness and a pea or marble sized lump/knot at the groin site which can take up to three months to resolve.  Get help right away if you notice sudden swelling at the puncture site.  Check your puncture site every day for signs of infection: fever, redness, swelling, pus drainage, warmth, foul odor or excessive pain. If this occurs, please call the office at [phone], to speak with the nurse. Get help right away if your puncture site is bleeding and the bleeding does not stop after applying firm pressure to the area.  You may continue to have skipped beats/ atrial fibrillation during the first several months after your procedure.  It is very important not to miss any doses of your blood thinner Eliquis. Patient restarted taking this medication on yesterday, 02/07/24.   You will follow up with the Afib clinic 4 weeks after your procedure on 03/06/24 and follow up with the APP on 05/08/24. Patient has sent a MyChart message to verify if he is supposed to see Dr. Elberta Fortis vs APP. He is aware that someone will contact him back with a response.    Patient verbalized understanding to all instructions provided.

## 2024-02-08 NOTE — Anesthesia Postprocedure Evaluation (Signed)
Anesthesia Post Note  Patient: Derrick [name]  Procedure(s) Performed: ATRIAL FIBRILLATION ABLATION     Patient location during evaluation: Cath Lab Anesthesia Type: General Level of consciousness: awake and alert Pain management: pain level controlled Vital Signs Assessment: post-procedure vital signs reviewed and stable Respiratory status: spontaneous breathing, nonlabored ventilation and respiratory function stable Cardiovascular status: blood pressure returned to baseline and stable Postop Assessment: no apparent nausea or vomiting Anesthetic complications: no   There were no known notable events for this encounter.     Chanel Mcadams

## 2024-02-09 ENCOUNTER — Encounter: Payer: Self-pay | Admitting: *Deleted

## 2024-02-13 ENCOUNTER — Other Ambulatory Visit: Payer: Medicare Other

## 2024-02-23 ENCOUNTER — Encounter: Payer: Self-pay | Admitting: Emergency Medicine

## 2024-03-04 ENCOUNTER — Other Ambulatory Visit: Payer: Self-pay | Admitting: Cardiovascular Disease

## 2024-03-04 DIAGNOSIS — I2510 Atherosclerotic heart disease of native coronary artery without angina pectoris: Secondary | ICD-10-CM

## 2024-03-06 ENCOUNTER — Ambulatory Visit (HOSPITAL_COMMUNITY)
Admission: RE | Admit: 2024-03-06 | Discharge: 2024-03-06 | Disposition: A | Discharge status: Home / Self Care | Payer: Medicare Other | Source: Ambulatory Visit | Attending: Physician Assistant | Admitting: Physician Assistant

## 2024-03-06 ENCOUNTER — Encounter (HOSPITAL_COMMUNITY): Payer: Self-pay | Admitting: Physician Assistant

## 2024-03-06 VITALS — BP 116/60 | HR 62 | Ht 69.0 in | Wt 188.6 lb

## 2024-03-06 DIAGNOSIS — I252 Old myocardial infarction: Secondary | ICD-10-CM | POA: Insufficient documentation

## 2024-03-06 DIAGNOSIS — I2510 Atherosclerotic heart disease of native coronary artery without angina pectoris: Secondary | ICD-10-CM | POA: Insufficient documentation

## 2024-03-06 DIAGNOSIS — Z85038 Personal history of other malignant neoplasm of large intestine: Secondary | ICD-10-CM | POA: Diagnosis not present

## 2024-03-06 DIAGNOSIS — I10 Essential (primary) hypertension: Secondary | ICD-10-CM | POA: Diagnosis not present

## 2024-03-06 DIAGNOSIS — E785 Hyperlipidemia, unspecified: Secondary | ICD-10-CM | POA: Diagnosis not present

## 2024-03-06 DIAGNOSIS — D6869 Other thrombophilia: Secondary | ICD-10-CM

## 2024-03-06 DIAGNOSIS — Z955 Presence of coronary angioplasty implant and graft: Secondary | ICD-10-CM | POA: Insufficient documentation

## 2024-03-06 DIAGNOSIS — G6181 Chronic inflammatory demyelinating polyneuritis: Secondary | ICD-10-CM | POA: Insufficient documentation

## 2024-03-06 DIAGNOSIS — I350 Nonrheumatic aortic (valve) stenosis: Secondary | ICD-10-CM | POA: Insufficient documentation

## 2024-03-06 DIAGNOSIS — Z950 Presence of cardiac pacemaker: Secondary | ICD-10-CM | POA: Insufficient documentation

## 2024-03-06 DIAGNOSIS — Z7901 Long term (current) use of anticoagulants: Secondary | ICD-10-CM | POA: Insufficient documentation

## 2024-03-06 DIAGNOSIS — I480 Paroxysmal atrial fibrillation: Secondary | ICD-10-CM | POA: Diagnosis present

## 2024-03-06 DIAGNOSIS — I4819 Other persistent atrial fibrillation: Secondary | ICD-10-CM

## 2024-03-06 DIAGNOSIS — Z951 Presence of aortocoronary bypass graft: Secondary | ICD-10-CM | POA: Insufficient documentation

## 2024-03-06 DIAGNOSIS — I495 Sick sinus syndrome: Secondary | ICD-10-CM | POA: Insufficient documentation

## 2024-03-06 DIAGNOSIS — I4729 Other ventricular tachycardia: Secondary | ICD-10-CM | POA: Diagnosis not present

## 2024-03-06 DIAGNOSIS — D472 Monoclonal gammopathy: Secondary | ICD-10-CM | POA: Insufficient documentation

## 2024-03-06 NOTE — Progress Notes (Signed)
 Primary Care Physician: Excell Seltzer, MD Primary Cardiologist: Tonny Bollman, MD Electrophysiologist: Regan Lemming, MD  Referring Physician: Dr. Jannet Askew Derrick [name] is a 83 y.o. 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 Patient called on 7/3 noting arrhythmia since 6/11 and was advised to come off Multaq. Patient is on Eliquis 5 mg BID for a CHADS2VASC score of 6.  On evaluation, he is currently in AV dual paced rhythm. He did not end up stopping the Multaq. Patient states he felt he went back into normal rhythm while waiting for return call on what to do. He admits that persistent AF likely attributable to mountain trip where he drank more wine, more coffee, and ate out a lot more than usual. He stopped drinking wine as of 7/3 and switched to one cup of decaf. He has not had Afib since then.    On follow up 09/29/23, he is currently in a V paced rhythm. Device interrogation by Dr. Elberta Fortis noted on 9/28 that he is in 100% Afib; advised to stop Multaq. No missed doses of Eliquis. He is minimally symptomatic. He walks daily with no issue. No bleeding issues on Eliquis.   Follow up 03/06/24. Patient returns for follow up for atrial fibrillation. He is s/p afib ablation with Dr Elberta Fortis on 02/07/24. He remains in SR today. He has had some intermittent fatigue but is unsure if this was afib. He denies chest pain or groin issues. No bleeding issues on anticoagulation.    Today, he denies symptoms of palpitations, chest pain, shortness of breath, orthopnea, PND, lower extremity edema, dizziness, presyncope, syncope, snoring, daytime somnolence, bleeding, or neurologic sequela. The patient is  tolerating medications without difficulties and is otherwise without complaint today.    he has a BMI of Body mass index is 27.85 kg/m.[name] Filed Weights   03/06/24 1116  Weight: 85.5 kg    Current Outpatient Medications  Medication Sig Dispense Refill   amoxicillin (AMOXIL) 500 MG tablet TAKE 2 CAPSULES BY MOUTH ONE HOUR BEFORE APPOINTMENT AND 2 IMMEDIATELY AFTER THE APPOINTMENT     apixaban (ELIQUIS) 5 MG TABS tablet Take 1 tablet by mouth twice daily 180 tablet 1   bimatoprost (LUMIGAN) 0.01 % SOLN Place 1 drop into both eyes at bedtime.     Cholecalciferol 25 MCG (1000 UT) tablet Take 1,000 Units by mouth in the morning.     diclofenac Sodium (VOLTAREN) 1 % GEL Apply 2 g topically 4 (four) times daily as needed (knee pain).     dorzolamide-timolol (COSOPT) 22.3-6.8 MG/ML ophthalmic solution Place 1 drop into both eyes 2 (two) times daily.      finasteride (PROSCAR) 5 MG tablet Take 5 mg by mouth in the morning.     fluticasone (FLONASE) 50 MCG/ACT nasal spray Place 2 sprays into both nostrils 2 (two) times daily as needed (nasal congestion.).     Fluticasone Furoate (ARNUITY ELLIPTA) 100 MCG/ACT AEPB Inhale 1 puff into the lungs 2 (two) times daily. (Patient taking differently: Inhale 1 puff into the lungs 2 (two) times daily as needed (respiratory issues (post respiratory viruses)).) 30 each 11   Multiple Vitamin (MULTIVITAMIN WITH MINERALS) TABS tablet Take 1  tablet by mouth every evening.     pantoprazole (PROTONIX) 40 MG tablet Take 1 tablet (40 mg total) by mouth daily. 90 tablet 3   Polyethyl Glycol-Propyl Glycol (SYSTANE OP) Place 1 drop into both eyes 3 (three) times daily as needed (dry/irritated eyes.).     rosuvastatin (CRESTOR) 20 MG tablet Take 1 tablet by mouth once daily 90 tablet 0   sacubitril-valsartan (ENTRESTO) 24-26 MG Take 1 tablet by mouth 2 (two) times daily. 180 tablet 3   tadalafil (CIALIS) 5 MG tablet Take 5 mg by mouth at bedtime.      triamcinolone cream  (KENALOG) 0.1 % Apply 1 Application topically 2 (two) times daily as needed (skin irritation).     [Paused] triamterene-hydrochlorothiazide (MAXZIDE-25) 37.5-25 MG tablet Take 1 tablet by mouth once daily (Patient taking differently: Take 1 tablet by mouth daily.) 90 tablet 3   No current facility-administered medications for this encounter.    Atrial Fibrillation Management history:  Previous antiarrhythmic drugs: Multaq Previous cardioversions: none Previous ablations: 02/07/24 Anticoagulation history: Eliquis   ROS- All systems are reviewed and negative except as per the HPI above.  Physical Exam: BP 116/60   Pulse 62   Ht 5\' 9"  (1.753 m)   Wt 85.5 kg   BMI 27.85 kg/m   GEN: Well nourished, well developed in no acute distress CARDIAC: Regular rate and rhythm, no rubs, gallops, 2/6 systolic murmur  RESPIRATORY:  Clear to auscultation without rales, wheezing or rhonchi  ABDOMEN: Soft, non-tender, non-distended EXTREMITIES:  No edema; No deformity    EKG today demonstrates  AV paced rhythm Vent. rate 62 BPM PR interval 168 ms QRS duration 158 ms QT/QTcB 450/456 ms   Echo 11/10/23 demonstrated:  1. Left ventricular ejection fraction, by estimation, is 55 to 60%. The  left ventricle has normal function. The left ventricle has no regional  wall motion abnormalities. Left ventricular diastolic parameters are  indeterminate. The average left ventricular global longitudinal strain is 15.0 %. The global longitudinal strain is abnormal.   2. Right ventricular systolic function is mildly reduced. The right  ventricular size is normal. There is mildly elevated pulmonary artery  systolic pressure. The estimated right ventricular systolic pressure is  39.1 mmHg.   3. Left atrial size was severely dilated.   4. Right atrial size was moderately dilated.   5. The mitral valve is degenerative. Moderate mitral valve regurgitation.  No evidence of mitral stenosis.   6. The aortic  valve is tricuspid. There is severe calcifcation of the  aortic valve. Aortic valve regurgitation is trivial. Moderate to severe  aortic valve stenosis. Aortic valve area, by VTI measures 0.97 cm. Aortic  valve mean gradient measures 25.0  mmHg. Possible paradoxical low flow/low gradient severe aortic stenosis  though borderline (calculated AVA 1.0 cm^2 but dimensionless index 0.34).   7. Aortic dilatation noted. There is mild dilatation of the ascending  aorta, measuring 42 mm.   8. The inferior vena cava is dilated in size with >50% respiratory  variability, suggesting right atrial pressure of 8 mmHg.     CHA2DS2-VASc Score = 5  The patient's score is based upon: CHF History: 1 HTN History: 1 Diabetes History: 0 Stroke History: 0 Vascular Disease History: 1 Age Score: 2 Gender Score: 0       ASSESSMENT AND PLAN: Persistent Atrial Fibrillation (ICD10:  I48.19) The patient's CHA2DS2-VASc score is 5, indicating a 7.2% annual risk of stroke.   Previously failed Multaq S/p afib ablation 02/07/24  Patient appears to be maintaining SR Continue Eliquis 5 mg BID with no missed doses for 3 months post ablation.   Secondary Hypercoagulable State (ICD10:  D68.69) The patient is at significant risk for stroke/thromboembolism based upon his CHA2DS2-VASc Score of 5.  Continue Apixaban (Eliquis). No bleeding issues.   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    Jorja Loa PA-C Afib Clinic [facility] [address] [place], [place] [address] [phone]

## 2024-03-23 ENCOUNTER — Ambulatory Visit (INDEPENDENT_AMBULATORY_CARE_PROVIDER_SITE_OTHER): Payer: Self-pay

## 2024-03-23 DIAGNOSIS — I495 Sick sinus syndrome: Secondary | ICD-10-CM

## 2024-03-23 LAB — CUP PACEART REMOTE DEVICE CHECK
Battery Remaining Longevity: 109 mo
Battery Voltage: 3.01 V
Brady Statistic AP VP Percent: 85.72 %
Brady Statistic AP VS Percent: 0.01 %
Brady Statistic AS VP Percent: 14.24 %
Brady Statistic AS VS Percent: 0.03 %
Brady Statistic RA Percent Paced: 85.57 %
Brady Statistic RV Percent Paced: 99.96 %
Date Time Interrogation Session: 20250327201728
Implantable Lead Connection Status: 753985
Implantable Lead Connection Status: 753985
Implantable Lead Connection Status: 753985
Implantable Lead Implant Date: 20170517
Implantable Lead Implant Date: 20170517
Implantable Lead Implant Date: 20230928
Implantable Lead Location: 753858
Implantable Lead Location: 753859
Implantable Lead Location: 753860
Implantable Lead Model: 4398
Implantable Lead Model: 5076
Implantable Lead Model: 5076
Implantable Pulse Generator Implant Date: 20230928
Lead Channel Impedance Value: 304 Ohm
Lead Channel Impedance Value: 323 Ohm
Lead Channel Impedance Value: 323 Ohm
Lead Channel Impedance Value: 323 Ohm
Lead Channel Impedance Value: 361 Ohm
Lead Channel Impedance Value: 361 Ohm
Lead Channel Impedance Value: 361 Ohm
Lead Channel Impedance Value: 399 Ohm
Lead Channel Impedance Value: 475 Ohm
Lead Channel Impedance Value: 513 Ohm
Lead Channel Impedance Value: 513 Ohm
Lead Channel Impedance Value: 570 Ohm
Lead Channel Impedance Value: 570 Ohm
Lead Channel Impedance Value: 570 Ohm
Lead Channel Pacing Threshold Amplitude: 0.625 V
Lead Channel Pacing Threshold Amplitude: 0.875 V
Lead Channel Pacing Threshold Amplitude: 0.875 V
Lead Channel Pacing Threshold Pulse Width: 0.4 ms
Lead Channel Pacing Threshold Pulse Width: 0.4 ms
Lead Channel Pacing Threshold Pulse Width: 0.6 ms
Lead Channel Sensing Intrinsic Amplitude: 1.625 mV
Lead Channel Sensing Intrinsic Amplitude: 1.625 mV
Lead Channel Sensing Intrinsic Amplitude: 11.875 mV
Lead Channel Sensing Intrinsic Amplitude: 11.875 mV
Lead Channel Setting Pacing Amplitude: 1.5 V
Lead Channel Setting Pacing Amplitude: 1.5 V
Lead Channel Setting Pacing Amplitude: 2 V
Lead Channel Setting Pacing Pulse Width: 0.4 ms
Lead Channel Setting Pacing Pulse Width: 0.6 ms
Lead Channel Setting Sensing Sensitivity: 1.2 mV
Zone Setting Status: 755011
Zone Setting Status: 755011

## 2024-03-30 ENCOUNTER — Other Ambulatory Visit: Payer: Self-pay

## 2024-03-30 MED ORDER — METOPROLOL SUCCINATE ER 50 MG PO TB24: 50.0000 mg | ORAL_TABLET | Freq: Every day | ORAL | 3 refills | Status: AC

## 2024-04-05 ENCOUNTER — Other Ambulatory Visit: Payer: Self-pay | Admitting: Cardiovascular Disease

## 2024-04-24 DIAGNOSIS — H401132 Primary open-angle glaucoma, bilateral, moderate stage: Secondary | ICD-10-CM | POA: Diagnosis not present

## 2024-05-01 NOTE — Progress Notes (Signed)
 Remote pacemaker transmission.

## 2024-05-07 NOTE — Progress Notes (Unsigned)
  Electrophysiology Office Note:   ID:  Derrick, [name] Jan 05, 1941, MRN [id number]  Primary Cardiologist: Arnoldo Lapping, MD Electrophysiologist: Lei Pump, MD  {Click to update primary MD,subspecialty MD or APP then REFRESH:1}    History of Present Illness:   Derrick [name] is a 83 y.o. male with h/o CAD s/p CABG, SND s/p PPM, HTN, HLD, and PAF seen today for routine electrophysiology follow-up s/p Ablation.  Since last being seen in our clinic the patient reports doing ***.  he denies chest pain, palpitations, dyspnea, PND, orthopnea, nausea, vomiting, dizziness, syncope, edema, weight gain, or early satiety.    Review of systems complete and found to be negative unless listed in HPI.   EP Information / Studies Reviewed:    EKG is ordered today. Personal review as below.       PPM Interrogation-  reviewed in detail today,  See PACEART report.  Arrhythmia/Device History S/p PVI and posterior wall ablation 02/07/2024  MDT Dual Chamber PPM 04/2016 for Symptomatic Bradycardia -> BIV Upgrade 08/2022   Physical Exam:   VS:  There were no vitals taken for this visit.   Wt Readings from Last 3 Encounters:  03/06/24 188 lb 9.6 oz (85.5 kg)  02/07/24 182 lb (82.6 kg)  01/20/24 189 lb (85.7 kg)     GEN: No acute distress  NECK: No JVD; No carotid bruits CARDIAC: {EPRHYTHM:28826}, no murmurs, rubs, gallops RESPIRATORY:  Clear to auscultation without rales, wheezing or rhonchi  ABDOMEN: Soft, non-tender, non-distended EXTREMITIES:  {EDEMA LEVEL:28147::"No"} edema; No deformity   ASSESSMENT AND PLAN:    Symptomatic bradycardia s/p Medtronic PPM  Normal PPM function See Pace Art report No changes today  Persistent AF S/p ablation 01/2024 EKG today shows *** Continue eliquis  5 mg BID for CHA2DS2VASc of at least 5  Secondary hypercoagulable state Pt on Eliquis  as above    CAD Denies s/s ischemia  Mod/Sev AS Follows by Dr. Arlester Ladd  {Click here to Review PMH,  Prob List, Meds, Allergies, SHx, FHx  :1}   Disposition:   Follow up with {EPPROVIDERS:28135} {EPFOLLOW UP:28173}  Signed, Tylene Galla, PA-C

## 2024-05-08 ENCOUNTER — Ambulatory Visit: Payer: Medicare Other | Attending: Student | Admitting: Student

## 2024-05-08 ENCOUNTER — Encounter: Payer: Self-pay | Admitting: Family Medicine

## 2024-05-08 ENCOUNTER — Encounter: Payer: Self-pay | Admitting: Student

## 2024-05-08 VITALS — BP 104/56 | HR 65 | Ht 69.0 in | Wt 191.0 lb

## 2024-05-08 DIAGNOSIS — I2510 Atherosclerotic heart disease of native coronary artery without angina pectoris: Secondary | ICD-10-CM

## 2024-05-08 DIAGNOSIS — Z23 Encounter for immunization: Secondary | ICD-10-CM | POA: Diagnosis not present

## 2024-05-08 DIAGNOSIS — D6869 Other thrombophilia: Secondary | ICD-10-CM | POA: Diagnosis not present

## 2024-05-08 DIAGNOSIS — I495 Sick sinus syndrome: Secondary | ICD-10-CM | POA: Diagnosis not present

## 2024-05-08 DIAGNOSIS — I350 Nonrheumatic aortic (valve) stenosis: Secondary | ICD-10-CM

## 2024-05-08 DIAGNOSIS — I4819 Other persistent atrial fibrillation: Secondary | ICD-10-CM

## 2024-05-08 LAB — CUP PACEART INCLINIC DEVICE CHECK
Battery Remaining Longevity: 116 mo
Battery Voltage: 3.01 V
Brady Statistic AP VP Percent: 91.58 %
Brady Statistic AP VS Percent: 0.01 %
Brady Statistic AS VP Percent: 8.35 %
Brady Statistic AS VS Percent: 0.06 %
Brady Statistic RA Percent Paced: 91.52 %
Brady Statistic RV Percent Paced: 99.92 %
Date Time Interrogation Session: 20250513124547
Implantable Lead Connection Status: 753985
Implantable Lead Connection Status: 753985
Implantable Lead Connection Status: 753985
Implantable Lead Implant Date: 20170517
Implantable Lead Implant Date: 20170517
Implantable Lead Implant Date: 20230928
Implantable Lead Location: 753858
Implantable Lead Location: 753859
Implantable Lead Location: 753860
Implantable Lead Model: 4398
Implantable Lead Model: 5076
Implantable Lead Model: 5076
Implantable Pulse Generator Implant Date: 20230928
Lead Channel Impedance Value: 285 Ohm
Lead Channel Impedance Value: 323 Ohm
Lead Channel Impedance Value: 323 Ohm
Lead Channel Impedance Value: 323 Ohm
Lead Channel Impedance Value: 361 Ohm
Lead Channel Impedance Value: 399 Ohm
Lead Channel Impedance Value: 399 Ohm
Lead Channel Impedance Value: 399 Ohm
Lead Channel Impedance Value: 513 Ohm
Lead Channel Impedance Value: 532 Ohm
Lead Channel Impedance Value: 570 Ohm
Lead Channel Impedance Value: 589 Ohm
Lead Channel Impedance Value: 589 Ohm
Lead Channel Impedance Value: 589 Ohm
Lead Channel Pacing Threshold Amplitude: 0.625 V
Lead Channel Pacing Threshold Amplitude: 0.875 V
Lead Channel Pacing Threshold Amplitude: 0.875 V
Lead Channel Pacing Threshold Pulse Width: 0.4 ms
Lead Channel Pacing Threshold Pulse Width: 0.4 ms
Lead Channel Pacing Threshold Pulse Width: 0.6 ms
Lead Channel Sensing Intrinsic Amplitude: 1.375 mV
Lead Channel Sensing Intrinsic Amplitude: 1.375 mV
Lead Channel Sensing Intrinsic Amplitude: 11.875 mV
Lead Channel Sensing Intrinsic Amplitude: 11.875 mV
Lead Channel Setting Pacing Amplitude: 1.25 V
Lead Channel Setting Pacing Amplitude: 1.5 V
Lead Channel Setting Pacing Amplitude: 2 V
Lead Channel Setting Pacing Pulse Width: 0.4 ms
Lead Channel Setting Pacing Pulse Width: 0.6 ms
Lead Channel Setting Sensing Sensitivity: 1.2 mV
Zone Setting Status: 755011
Zone Setting Status: 755011

## 2024-05-08 NOTE — Patient Instructions (Signed)
 Medication Instructions:  Your physician recommends that you continue on your current medications as directed. Please refer to the Current Medication list given to you today.  *If you need a refill on your cardiac medications before your next appointment, please call your pharmacy*  Lab Work: None ordered If you have labs (blood work) drawn today and your tests are completely normal, you will receive your results only by: MyChart Message (if you have MyChart) OR A paper copy in the mail If you have any lab test that is abnormal or we need to change your treatment, we will call you to review the results.  Follow-Up: At [facility], you and your health needs are our priority.  As part of our continuing mission to provide you with exceptional heart care, our providers are all part of one team.  This team includes your primary Cardiologist (physician) and Advanced Practice Providers or APPs (Physician Assistants and Nurse Practitioners) who all work together to provide you with the care you need, when you need it.  Your next appointment:   6 month(s)  Provider:   Agatha Horsfall, MD

## 2024-05-09 ENCOUNTER — Encounter: Payer: Self-pay | Admitting: Family Medicine

## 2024-05-10 ENCOUNTER — Encounter: Payer: Self-pay | Admitting: Family Medicine

## 2024-05-10 ENCOUNTER — Ambulatory Visit: Payer: Self-pay | Admitting: Cardiology

## 2024-05-10 ENCOUNTER — Ambulatory Visit (INDEPENDENT_AMBULATORY_CARE_PROVIDER_SITE_OTHER): Admitting: Family Medicine

## 2024-05-10 VITALS — BP 116/64 | HR 71 | Temp 97.3°F | Ht 69.0 in | Wt 194.0 lb

## 2024-05-10 DIAGNOSIS — R200 Anesthesia of skin: Secondary | ICD-10-CM | POA: Diagnosis not present

## 2024-05-10 DIAGNOSIS — M79642 Pain in left hand: Secondary | ICD-10-CM | POA: Diagnosis not present

## 2024-05-10 NOTE — Progress Notes (Signed)
 Patient ID: Derrick [name], male    DOB: 05-02-1941, 83 y.o.   MRN: [id number]  This visit was conducted in person.  BP 116/64 (BP Location: Left Arm, Patient Position: Sitting, Cuff Size: Normal)   Pulse 71   Temp (!) 97.3 F (36.3 C) (Temporal)   Ht 5\' 9"  (1.753 m)   Wt 194 lb (88 kg)   SpO2 98%   BMI 28.65 kg/m    CC:  Chief Complaint  Patient presents with   Insomnia    Takes regular tylenol  as needed for pain. Patient states he can not sleep due to the pain from the carpel tunnel.    carpel tunnel    Patient states he gets numbness in both hands and pain in the left hand. Patient takes tylenol  for the pain but does not help much.     Subjective:   HPI: Derrick [name] is a 83 y.o. male presenting on 05/10/2024 for Insomnia (Takes regular tylenol  as needed for pain. Patient states he can not sleep due to the pain from the carpel tunnel. ) and carpel tunnel (Patient states he gets numbness in both hands and pain in the left hand. Patient takes tylenol  for the pain but does not help much. )  History of carpal tunnel in past bilaterally.  Had positive nerve conduction in 2019 Had seen Dr. Huntley Mai in past for thumb issues.... was ging to have repair but never did given heart issues came up  He contacted them... Has an appt with Dr. Manning Seen in June... but he would be interested in possibly seeing Dr. [name] Mary   Now with  numbness in both hands, pain inn left hand intermittently in last 6 months.  Numbness comes with holding kindle.  Left thumb , and next 2 digits extremely painful. Tylenol  doe not help with pain. Pain from carpal tunnel keeping him up at night.     No neck pain, no fever.   Has not worn brace lately.   Some decreased ROM in neck.. 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 Interim medical history since our last visit reviewed. Allergies and medications  reviewed and updated. Outpatient Medications Prior to Visit  Medication Sig Dispense Refill   amoxicillin  (AMOXIL ) 500 MG tablet TAKE 2 CAPSULES BY MOUTH ONE HOUR BEFORE APPOINTMENT AND 2 IMMEDIATELY AFTER THE APPOINTMENT     apixaban  (ELIQUIS ) 5 MG TABS tablet Take 1 tablet by mouth twice daily 180 tablet 1   bimatoprost (LUMIGAN) 0.01 % SOLN Place 1 drop into both eyes at bedtime.     Cholecalciferol  25 MCG (1000 UT) tablet Take 1,000 Units by mouth in the morning.     diclofenac Sodium (VOLTAREN) 1 % GEL Apply 2 g topically 4 (four) times daily as needed (knee pain).     dorzolamide -timolol  (COSOPT ) 22.3-6.8 MG/ML ophthalmic solution Place 1 drop into both eyes 2 (two) times daily.      finasteride  (PROSCAR ) 5 MG tablet Take 5 mg by mouth in the morning.     fluticasone  (FLONASE ) 50 MCG/ACT nasal spray Place 2 sprays into both nostrils 2 (two) times daily as needed (nasal congestion.).     Fluticasone  Furoate (ARNUITY ELLIPTA ) 100 MCG/ACT AEPB Inhale 1 puff into the lungs 2 (two) times daily. (Patient taking differently: Inhale 1 puff into the lungs 2 (two) times daily as needed (respiratory issues (post respiratory viruses)).) 30 each 11  metoprolol  succinate (TOPROL -XL) 50 MG 24 hr tablet Take 1 tablet (50 mg total) by mouth daily. Take with or immediately following a meal. 90 tablet 3   Multiple Vitamin (MULTIVITAMIN WITH MINERALS) TABS tablet Take 1 tablet by mouth every evening.     pantoprazole  (PROTONIX ) 40 MG tablet Take 1 tablet (40 mg total) by mouth daily. 90 tablet 3   Polyethyl Glycol-Propyl Glycol (SYSTANE OP) Place 1 drop into both eyes 3 (three) times daily as needed (dry/irritated eyes.).     rosuvastatin  (CRESTOR ) 20 MG tablet Take 1 tablet by mouth once daily 90 tablet 3   sacubitril -valsartan  (ENTRESTO ) 24-26 MG Take 1 tablet by mouth twice daily 180 tablet 3   tadalafil  (CIALIS ) 5 MG tablet Take 5 mg by mouth at bedtime.      triamcinolone  cream (KENALOG ) 0.1 % Apply 1  Application topically 2 (two) times daily as needed (skin irritation).     triamterene -hydrochlorothiazide  (MAXZIDE -25) 37.5-25 MG tablet Take 1 tablet by mouth once daily (Patient taking differently: Take 1 tablet by mouth daily.) 90 tablet 3   No facility-administered medications prior to visit.     Per HPI unless specifically indicated in ROS section below Review of Systems  Constitutional:  Negative for fatigue and fever.  HENT:  Negative for ear pain.   Eyes:  Negative for pain.  Respiratory:  Negative for cough and shortness of breath.   Cardiovascular:  Negative for chest pain, palpitations and leg swelling.  Gastrointestinal:  Negative for abdominal pain.  Genitourinary:  Negative for dysuria.  Musculoskeletal:  Negative for arthralgias, neck pain and neck stiffness.  Neurological:  Negative for syncope, light-headedness and headaches.  Psychiatric/Behavioral:  Negative for dysphoric mood.    Objective:  BP 116/64 (BP Location: Left Arm, Patient Position: Sitting, Cuff Size: Normal)   Pulse 71   Temp (!) 97.3 F (36.3 C) (Temporal)   Ht 5\' 9"  (1.753 m)   Wt 194 lb (88 kg)   SpO2 98%   BMI 28.65 kg/m   Wt Readings from Last 3 Encounters:  05/10/24 194 lb (88 kg)  05/08/24 191 lb (86.6 kg)  03/06/24 188 lb 9.6 oz (85.5 kg)      Physical Exam Vitals reviewed.  Constitutional:      Appearance: He is well-developed.  HENT:     Head: Normocephalic.     Right Ear: Hearing normal.     Left Ear: Hearing normal.     Nose: Nose normal.  Neck:     Thyroid : No thyroid  mass or thyromegaly.     Vascular: No carotid bruit.     Trachea: Trachea normal.  Cardiovascular:     Rate and Rhythm: Normal rate and regular rhythm.     Pulses: Normal pulses.     Heart sounds: Heart sounds not distant. No murmur heard.    No friction rub. No gallop.     Comments: No peripheral edema Pulmonary:     Effort: Pulmonary effort is normal. No respiratory distress.     Breath sounds:  Normal breath sounds.  Musculoskeletal:     Right forearm: Normal.     Left forearm: Normal.     Right wrist: Tenderness present. No bony tenderness or snuff box tenderness. Normal range of motion. Normal pulse.     Left wrist: Tenderness present. No bony tenderness or snuff box tenderness. Normal range of motion. Normal pulse.     Right hand: Normal. Normal pulse.     Left hand: Normal. Normal  pulse.     Cervical back: No tenderness or bony tenderness. No pain with movement. Decreased range of motion.     Comments: Significant decreased range of motion of neck likely due to osteoarthritis/DISH Negative Spurling  Positive Tinel and Phalen test on the left  Skin:    General: Skin is warm and dry.     Findings: No rash.  Neurological:     General: No focal deficit present.     Mental Status: He is oriented to person, place, and time.     Cranial Nerves: Cranial nerves 2-12 are intact.     Sensory: Sensation is intact.     Motor: Motor function is intact.     Coordination: Coordination is intact.     Gait: Gait is intact.     Comments: Mild atrophy of left thenar eminence.  Psychiatric:        Speech: Speech normal.        Behavior: Behavior normal.        Thought Content: Thought content normal.       Results for orders placed or performed in visit on 05/08/24  CUP PACEART [facility] DEVICE CHECK   Collection Time: 05/08/24 12:45 PM  Result Value Ref Range   Date Time Interrogation Session [id number]    Pulse Generator Manufacturer [facility]    Pulse Gen Model [id number] [facility] CRT-P    Pulse Gen Serial Number [id number]    Clinic Name [facility]    Implantable Pulse Generator Type Cardiac Resynch Therapy Pacemaker    Implantable Pulse Generator Implant Date 65784696    Implantable Lead Manufacturer [facility]    Implantable Lead Model 4398 Attain Performa Straight MRI SureScan    Implantable Lead Serial Number [id number]    Implantable Lead Implant Date 29528413     Implantable Lead Location Detail 1 UNKNOWN    Implantable Lead Location [id number]    Implantable Lead Connection Status [id number]    Implantable Lead Manufacturer [employer]M    Implantable Lead Model 5076 CapSureFix Novus MRI SureScan    Implantable Lead Serial Number [id number]    Implantable Lead Implant Date 36644034    Implantable Lead Location Detail 1 APPENDAGE    Implantable Lead Location [id number]    Implantable Lead Connection Status [id number]    Implantable Lead Manufacturer MERM    Implantable Lead Model 5076 CapSureFix Novus MRI SureScan    Implantable Lead Serial Number [id number]    Implantable Lead Implant Date 74259563    Implantable Lead Location Detail 1 APEX    Implantable Lead Location [id number]    Implantable Lead Connection Status [id number]    Lead Channel Setting Sensing Sensitivity 1.2 mV   Lead Channel Setting Pacing Amplitude 1.5 V   Lead Channel Setting Pacing Pulse Width 0.4 ms   Lead Channel Setting Pacing Amplitude 2 V   Lead Channel Setting Pacing Pulse Width 0.6 ms   Lead Channel Setting Pacing Amplitude 1.25 V   Lead Channel Setting Pacing Capture Mode Adaptive Capture    Zone Setting Status 755011    Zone Setting Status [phone]    Lead Channel Impedance Value 399 ohm   Lead Channel Impedance Value 323 ohm   Lead Channel Sensing Intrinsic Amplitude 1.375 mV   Lead Channel Sensing Intrinsic Amplitude 1.375 mV   Lead Channel Pacing Threshold Amplitude 0.625 V   Lead Channel Pacing Threshold Pulse Width 0.4 ms   Lead Channel Impedance Value 513 ohm   Lead Channel Impedance Value 399 ohm  Lead Channel Sensing Intrinsic Amplitude 11.875 mV   Lead Channel Sensing Intrinsic Amplitude 11.875 mV   Lead Channel Pacing Threshold Amplitude 0.875 V   Lead Channel Pacing Threshold Pulse Width 0.4 ms   Lead Channel Impedance Value 399 ohm   Lead Channel Impedance Value 323 ohm   Lead Channel Impedance Value 323 ohm   Lead Channel Impedance Value 285 ohm   Lead Channel Impedance  Value 589 ohm   Lead Channel Impedance Value 589 ohm   Lead Channel Impedance Value 570 ohm   Lead Channel Impedance Value 361 ohm   Lead Channel Impedance Value 532 ohm   Lead Channel Impedance Value 589 ohm   Lead Channel Pacing Threshold Amplitude 0.875 V   Lead Channel Pacing Threshold Pulse Width 0.6 ms   Battery Status OK    Battery Remaining Longevity 116 mo   Battery Voltage 3.01 V   Brady Statistic RA Percent Paced 91.52 %   Brady Statistic RV Percent Paced 99.92 %   Brady Statistic AP VP Percent 91.58 %   Brady Statistic AS VP Percent 8.35 %   Brady Statistic AP VS Percent 0.01 %   Brady Statistic AS VS Percent 0.06 %   *Note: Due to a large number of results and/or encounters for the requested time period, some results have not been displayed. A complete set of results can be found in Results Review.    Assessment and Plan  Bilateral hand numbness -     Ambulatory referral to Hand Surgery -     Nerve conduction test; Future  Left hand pain -     Ambulatory referral to Hand Surgery -     Nerve conduction test; Future  Symptoms most consistent with bilateral carpal tunnel syndrome, left greater than right. Will move forward with nerve conduction test to verify pain in left hand and bilateral numbness due to peripheral source as opposed to central source given decreased range of motion and osteoarthritis in neck. Encouraged patient to wear bilateral wrist braces at night and to avoid wrist use that compresses the carpal tunnel. Given the severity of the symptoms I will go ahead and place a referral to a hand specialist for possible carpal tunnel release. Patient will use Tylenol  for pain.  NSAIDs are contraindicated and patient does not want to take stronger pain medication such as tramadol .  He has had side effects to prednisone  in the past (it put him into A-fib)  No follow-ups on file.   Herby Lolling, MD

## 2024-05-15 ENCOUNTER — Inpatient Hospital Stay: Payer: Medicare Other | Attending: Hematology and Oncology

## 2024-05-15 DIAGNOSIS — N1831 Chronic kidney disease, stage 3a: Secondary | ICD-10-CM | POA: Insufficient documentation

## 2024-05-15 DIAGNOSIS — D696 Thrombocytopenia, unspecified: Secondary | ICD-10-CM | POA: Diagnosis not present

## 2024-05-15 DIAGNOSIS — D472 Monoclonal gammopathy: Secondary | ICD-10-CM | POA: Diagnosis not present

## 2024-05-15 DIAGNOSIS — Z7901 Long term (current) use of anticoagulants: Secondary | ICD-10-CM | POA: Diagnosis not present

## 2024-05-15 LAB — CBC WITH DIFFERENTIAL (CANCER CENTER ONLY)
Abs Immature Granulocytes: 0.01 10*3/uL (ref 0.00–0.07)
Basophils Absolute: 0 10*3/uL (ref 0.0–0.1)
Basophils Relative: 1 %
Eosinophils Absolute: 0.2 10*3/uL (ref 0.0–0.5)
Eosinophils Relative: 5 %
HCT: 34.2 % — ABNORMAL LOW (ref 39.0–52.0)
Hemoglobin: 11.5 g/dL — ABNORMAL LOW (ref 13.0–17.0)
Immature Granulocytes: 0 %
Lymphocytes Relative: 18 %
Lymphs Abs: 0.9 10*3/uL (ref 0.7–4.0)
MCH: 31.5 pg (ref 26.0–34.0)
MCHC: 33.6 g/dL (ref 30.0–36.0)
MCV: 93.7 fL (ref 80.0–100.0)
Monocytes Absolute: 0.4 10*3/uL (ref 0.1–1.0)
Monocytes Relative: 9 %
Neutro Abs: 3.2 10*3/uL (ref 1.7–7.7)
Neutrophils Relative %: 67 %
Platelet Count: 128 10*3/uL — ABNORMAL LOW (ref 150–400)
RBC: 3.65 MIL/uL — ABNORMAL LOW (ref 4.22–5.81)
RDW: 12.8 % (ref 11.5–15.5)
WBC Count: 4.8 10*3/uL (ref 4.0–10.5)
nRBC: 0 % (ref 0.0–0.2)

## 2024-05-15 LAB — CMP (CANCER CENTER ONLY)
ALT: 20 U/L (ref 0–44)
AST: 24 U/L (ref 15–41)
Albumin: 4.6 g/dL (ref 3.5–5.0)
Alkaline Phosphatase: 46 U/L (ref 38–126)
Anion gap: 7 (ref 5–15)
BUN: 27 mg/dL — ABNORMAL HIGH (ref 8–23)
CO2: 25 mmol/L (ref 22–32)
Calcium: 10.1 mg/dL (ref 8.9–10.3)
Chloride: 106 mmol/L (ref 98–111)
Creatinine: 1.32 mg/dL — ABNORMAL HIGH (ref 0.61–1.24)
GFR, Estimated: 54 mL/min — ABNORMAL LOW (ref >60)
Glucose, Bld: 89 mg/dL (ref 70–99)
Potassium: 4.5 mmol/L (ref 3.5–5.1)
Sodium: 138 mmol/L (ref 135–145)
Total Bilirubin: 0.9 mg/dL (ref 0.0–1.2)
Total Protein: 7.2 g/dL (ref 6.5–8.1)

## 2024-05-16 ENCOUNTER — Encounter: Payer: Self-pay | Admitting: Family Medicine

## 2024-05-16 LAB — KAPPA/LAMBDA LIGHT CHAINS
Kappa free light chain: 36.2 mg/L — ABNORMAL HIGH (ref 3.3–19.4)
Kappa, lambda light chain ratio: 2.06 — ABNORMAL HIGH (ref 0.26–1.65)
Lambda free light chains: 17.6 mg/L (ref 5.7–26.3)

## 2024-05-17 ENCOUNTER — Ambulatory Visit: Payer: Self-pay

## 2024-05-17 ENCOUNTER — Encounter: Payer: Self-pay | Admitting: Family Medicine

## 2024-05-17 ENCOUNTER — Ambulatory Visit (INDEPENDENT_AMBULATORY_CARE_PROVIDER_SITE_OTHER): Admitting: Family Medicine

## 2024-05-17 VITALS — BP 110/60 | HR 88 | Temp 99.3°F | Ht 69.0 in | Wt 189.4 lb

## 2024-05-17 DIAGNOSIS — J4521 Mild intermittent asthma with (acute) exacerbation: Secondary | ICD-10-CM

## 2024-05-17 DIAGNOSIS — U071 COVID-19: Secondary | ICD-10-CM | POA: Diagnosis not present

## 2024-05-17 MED ORDER — PREDNISONE 10 MG PO TABS: ORAL_TABLET | ORAL | 0 refills | Status: DC

## 2024-05-17 MED ORDER — MOLNUPIRAVIR EUA 200MG CAPSULE: 4.0000 | ORAL_CAPSULE | Freq: Two times a day (BID) | ORAL | 0 refills | Status: AC

## 2024-05-17 NOTE — Progress Notes (Signed)
 Patient ID: Calob Baskette, male    DOB: 1941/08/22, 83 y.o.   MRN: [id number]  This visit was conducted in person.  BP 110/60   Pulse 88   Temp 99.3 F (37.4 C) (Temporal)   Ht 5\' 9"  (1.753 m)   Wt 189 lb 6 oz (85.9 kg)   SpO2 98%   BMI 27.97 kg/m    CC:  Chief Complaint  Patient presents with   Covid Positive    Home Test Positive x 2    Cough   Fever   Headache   Nasal Congestion    Symptoms started on Tuesday    Subjective:   HPI: Kaevon Cotta is a 83 y.o. male presenting on 05/17/2024 for Covid Positive (Home Test Positive x 2/), Cough, Fever, Headache, and Nasal Congestion (Symptoms started on Tuesday)   Date of onset:  2 days ago Initial symptoms included  ST, post nasal drip Symptoms progressed to HA, body ache,  Cough, dry No SOb, no wheeze.  Fever 99-101.62F    Sick contacts:   wife sick but tested negative. COVID testing:   positive     He has tried to treat with  tylenol .  Using Arnuity BID  Has not had to use albuterol  yet.      Has history of asthma and pulmonary sarcoidosis. Non-smoker.   Had afib SE to prednsione.       Relevant past medical, surgical, family and social history reviewed and updated as indicated. Interim medical history since our last visit reviewed. Allergies and medications reviewed and updated. Outpatient Medications Prior to Visit  Medication Sig Dispense Refill   amoxicillin  (AMOXIL ) 500 MG tablet TAKE 2 CAPSULES BY MOUTH ONE HOUR BEFORE APPOINTMENT AND 2 IMMEDIATELY AFTER THE APPOINTMENT     apixaban  (ELIQUIS ) 5 MG TABS tablet Take 1 tablet by mouth twice daily 180 tablet 1   bimatoprost (LUMIGAN) 0.01 % SOLN Place 1 drop into both eyes at bedtime.     Cholecalciferol  25 MCG (1000 UT) tablet Take 1,000 Units by mouth in the morning.     diclofenac Sodium (VOLTAREN) 1 % GEL Apply 2 g topically 4 (four) times daily as needed (knee pain).     dorzolamide -timolol  (COSOPT ) 22.3-6.8 MG/ML ophthalmic solution  Place 1 drop into both eyes 2 (two) times daily.      finasteride  (PROSCAR ) 5 MG tablet Take 5 mg by mouth in the morning.     fluticasone  (FLONASE ) 50 MCG/ACT nasal spray Place 2 sprays into both nostrils 2 (two) times daily as needed (nasal congestion.).     Fluticasone  Furoate (ARNUITY ELLIPTA ) 100 MCG/ACT AEPB Inhale 1 puff into the lungs 2 (two) times daily as needed.     metoprolol  succinate (TOPROL -XL) 50 MG 24 hr tablet Take 1 tablet (50 mg total) by mouth daily. Take with or immediately following a meal. 90 tablet 3   Multiple Vitamin (MULTIVITAMIN WITH MINERALS) TABS tablet Take 1 tablet by mouth every evening.     pantoprazole  (PROTONIX ) 40 MG tablet Take 1 tablet (40 mg total) by mouth daily. 90 tablet 3   Polyethyl Glycol-Propyl Glycol (SYSTANE OP) Place 1 drop into both eyes 3 (three) times daily as needed (dry/irritated eyes.).     rosuvastatin  (CRESTOR ) 20 MG tablet Take 1 tablet by mouth once daily 90 tablet 3   sacubitril -valsartan  (ENTRESTO ) 24-26 MG Take 1 tablet by mouth twice daily 180 tablet 3   tadalafil  (CIALIS ) 5 MG tablet Take 5 mg  by mouth at bedtime.      triamcinolone  cream (KENALOG ) 0.1 % Apply 1 Application topically 2 (two) times daily as needed (skin irritation).     triamterene -hydrochlorothiazide  (MAXZIDE -25) 37.5-25 MG tablet Take 1 tablet by mouth daily.     triamterene -hydrochlorothiazide  (MAXZIDE -25) 37.5-25 MG tablet Take 1 tablet by mouth once daily 90 tablet 3   Fluticasone  Furoate (ARNUITY ELLIPTA ) 100 MCG/ACT AEPB Inhale 1 puff into the lungs 2 (two) times daily. (Patient taking differently: Inhale 1 puff into the lungs 2 (two) times daily as needed (respiratory issues (post respiratory viruses)).) 30 each 11   No facility-administered medications prior to visit.     Per HPI unless specifically indicated in ROS section below Review of Systems  Constitutional:  Positive for fatigue. Negative for fever.  HENT:  Positive for congestion and sore throat.  Negative for ear pain.   Eyes:  Negative for pain.  Respiratory:  Positive for cough. Negative for shortness of breath and wheezing.   Cardiovascular:  Negative for chest pain, palpitations and leg swelling.  Gastrointestinal:  Negative for abdominal pain.  Genitourinary:  Negative for dysuria.  Musculoskeletal:  Positive for myalgias. Negative for arthralgias.  Neurological:  Negative for syncope, light-headedness and headaches.  Psychiatric/Behavioral:  Negative for dysphoric mood.    Objective:  BP 110/60   Pulse 88   Temp 99.3 F (37.4 C) (Temporal)   Ht 5\' 9"  (1.753 m)   Wt 189 lb 6 oz (85.9 kg)   SpO2 98%   BMI 27.97 kg/m   Wt Readings from Last 3 Encounters:  05/17/24 189 lb 6 oz (85.9 kg)  05/10/24 194 lb (88 kg)  05/08/24 191 lb (86.6 kg)      Physical Exam Vitals reviewed.  Constitutional:      Appearance: He is well-developed.  HENT:     Head: Normocephalic.     Right Ear: Hearing normal.     Left Ear: Hearing normal.     Nose: Nose normal.  Neck:     Thyroid : No thyroid  mass or thyromegaly.     Vascular: No carotid bruit.     Trachea: Trachea normal.  Cardiovascular:     Rate and Rhythm: Normal rate and regular rhythm.     Pulses: Normal pulses.     Heart sounds: Heart sounds not distant. No murmur heard.    No friction rub. No gallop.     Comments: No peripheral edema Pulmonary:     Effort: Pulmonary effort is normal. No respiratory distress.     Breath sounds: Normal breath sounds.  Skin:    General: Skin is warm and dry.     Findings: No rash.  Psychiatric:        Speech: Speech normal.        Behavior: Behavior normal.        Thought Content: Thought content normal.       Results for orders placed or performed in visit on 05/15/24  Kappa/lambda light chains   Collection Time: 05/15/24  7:42 AM  Result Value Ref Range   Kappa free light chain 36.2 (H) 3.3 - 19.4 mg/L   Lambda free light chains 17.6 5.7 - 26.3 mg/L   Kappa, lambda light  chain ratio 2.06 (H) 0.26 - 1.65  CMP (Cancer Center only)   Collection Time: 05/15/24  7:42 AM  Result Value Ref Range   Sodium 138 135 - 145 mmol/L   Potassium 4.5 3.5 - 5.1 mmol/L   Chloride 106  98 - 111 mmol/L   CO2 25 22 - 32 mmol/L   Glucose, Bld 89 70 - 99 mg/dL   BUN 27 (H) 8 - 23 mg/dL   Creatinine 1.61 (H) 0.96 - 1.24 mg/dL   Calcium  10.1 8.9 - 10.3 mg/dL   Total Protein 7.2 6.5 - 8.1 g/dL   Albumin  4.6 3.5 - 5.0 g/dL   AST 24 15 - 41 U/L   ALT 20 0 - 44 U/L   Alkaline Phosphatase 46 38 - 126 U/L   Total Bilirubin 0.9 0.0 - 1.2 mg/dL   GFR, Estimated 54 (L) >60 mL/min   Anion gap 7 5 - 15  CBC with Differential (Cancer Center Only)   Collection Time: 05/15/24  7:42 AM  Result Value Ref Range   WBC Count 4.8 4.0 - 10.5 K/uL   RBC 3.65 (L) 4.22 - 5.81 MIL/uL   Hemoglobin 11.5 (L) 13.0 - 17.0 g/dL   HCT 04.5 (L) 40.9 - 81.1 %   MCV 93.7 80.0 - 100.0 fL   MCH 31.5 26.0 - 34.0 pg   MCHC 33.6 30.0 - 36.0 g/dL   RDW 91.4 78.2 - 95.6 %   Platelet Count 128 (L) 150 - 400 K/uL   nRBC 0.0 0.0 - 0.2 %   Neutrophils Relative % 67 %   Neutro Abs 3.2 1.7 - 7.7 K/uL   Lymphocytes Relative 18 %   Lymphs Abs 0.9 0.7 - 4.0 K/uL   Monocytes Relative 9 %   Monocytes Absolute 0.4 0.1 - 1.0 K/uL   Eosinophils Relative 5 %   Eosinophils Absolute 0.2 0.0 - 0.5 K/uL   Basophils Relative 1 %   Basophils Absolute 0.0 0.0 - 0.1 K/uL   Immature Granulocytes 0 %   Abs Immature Granulocytes 0.01 0.00 - 0.07 K/uL   *Note: Due to a large number of results and/or encounters for the requested time period, some results have not been displayed. A complete set of results can be found in Results Review.    Assessment and Plan  There are no diagnoses linked to this encounter.  No follow-ups on file.   Herby Lolling, MD

## 2024-05-17 NOTE — Assessment & Plan Note (Signed)
 Acute No SOB... But some chest tightness.  Encouraged him to continue Arnuity twice daily and albuterol  as needed. I did send in a prescription for low-dose prednisone  course for him to use if he notices worsening shortness of breath and wheeze.  We did not do a full course/dose given in the past prednisone  has triggered A-fib.  No red flags/need for ER visit  at this time..    Pt moderate to high risk for COVID complications given  asthma, immunocompromised state, and age and he is within the first 5 days of illness. Paxlovid contraindicated given Eliquis  use. Will treat with molnupiravir  for 5 days   Can monitor Oxygen saturation at home with home monitor if able to obtain.  Go to ER if O2 sat < 90% on room air.  Reviewed home care and provided information through MyChart.  Recommended quarantine until test returns. If returns positive 5 days isolation recommended. Return to work day 6 and wear mask for 4 more days to complete 10 days. Provided info about prevention of spread of COVID 19.

## 2024-05-17 NOTE — Telephone Encounter (Signed)
 Chief Complaint: covid positive Symptoms: cough, fever, headache, body aches Frequency: x 2 days Pertinent Negatives: Patient denies sob Disposition: [] ED /[] Urgent Care (no appt availability in office) / [x] Appointment(In office/virtual)/ []  [facility]/ [] Home Care/ [] Refused Recommended Disposition /[] [facility]/ []  Follow-up with PCP  Additional Notes: pt states that 2 days ago he developed cough and fever. States 101.3 fever, headache, congestion, runny nose along with the cough. States symptoms are the same as yesterday. States he had a COVID booster on May 08, 2024. States he took tylenol  for the fever.   Copied from CRM [id number]. Topic: Clinical - Red Word Triage >> May 17, 2024  8:07 AM Marlan Silva wrote: Red Word that prompted transfer to Nurse Triage: Patient called stating he tested positive for Covid and has a chronic respiratory infection. Patient is requesting anti-viral medication and to speak with a triage nurse. Reason for Disposition  [1] Fever > 101 F (38.3 C) AND [2] age > 60 years  Answer Assessment - Initial Assessment Questions 1. COVID-19 DIAGNOSIS: "How do you know that you have COVID?" (e.g., positive lab test or self-test, diagnosed by doctor or NP/PA, symptoms after exposure).     Home test 2. COVID-19 EXPOSURE: "Was there any known exposure to COVID before the symptoms began?" CDC Definition of close contact: within 6 feet (2 meters) for a total of 15 minutes or more over a 24-hour period.      no 3. ONSET: "When did the COVID-19 symptoms start?"      2 days ago 4. WORST SYMPTOM: "What is your worst symptom?" (e.g., cough, fever, shortness of breath, muscle aches)     cough 5. COUGH: "Do you have a cough?" If Yes, ask: "How bad is the cough?"       yes 6. FEVER: "Do you have a fever?" If Yes, ask: "What is your temperature, how was it measured, and when did it start?"     101.3 7. RESPIRATORY STATUS: "Describe your breathing?" (e.g.,  normal; shortness of breath, wheezing, unable to speak)      Same a normal 8. BETTER-SAME-WORSE: "Are you getting better, staying the same or getting worse compared to yesterday?"  If getting worse, ask, "In what way?"     Same  9. OTHER SYMPTOMS: "Do you have any other symptoms?"  (e.g., chills, fatigue, headache, loss of smell or taste, muscle pain, sore throat)     Headache, congestion, runny nose, body aches 10. HIGH RISK DISEASE: "Do you have any chronic medical problems?" (e.g., asthma, heart or lung disease, weak immune system, obesity, etc.)       Heart disease 11. VACCINE: "Have you had the COVID-19 vaccine?" If Yes, ask: "Which one, how many shots, when did you get it?"       Yes  13. O2 SATURATION MONITOR:  "Do you use an oxygen saturation monitor (pulse oximeter) at home?" If Yes, ask "What is your reading (oxygen level) today?" "What is your usual oxygen saturation reading?" (e.g., 95%)       no  Protocols used: Coronavirus (COVID-19) Diagnosed or Suspected-A-AH

## 2024-05-18 LAB — MULTIPLE MYELOMA PANEL, SERUM
Albumin SerPl Elph-Mcnc: 4.1 g/dL (ref 2.9–4.4)
Albumin/Glob SerPl: 1.4 (ref 0.7–1.7)
Alpha 1: 0.2 g/dL (ref 0.0–0.4)
Alpha2 Glob SerPl Elph-Mcnc: 0.7 g/dL (ref 0.4–1.0)
B-Globulin SerPl Elph-Mcnc: 0.9 g/dL (ref 0.7–1.3)
Gamma Glob SerPl Elph-Mcnc: 1.2 g/dL (ref 0.4–1.8)
Globulin, Total: 3 g/dL (ref 2.2–3.9)
IgA: 185 mg/dL (ref 61–437)
IgG (Immunoglobin G), Serum: 775 mg/dL (ref 603–1613)
IgM (Immunoglobulin M), Srm: 492 mg/dL — ABNORMAL HIGH (ref 15–143)
Total Protein ELP: 7.1 g/dL (ref 6.0–8.5)

## 2024-05-25 ENCOUNTER — Encounter: Payer: Self-pay | Admitting: Hematology and Oncology

## 2024-05-25 ENCOUNTER — Inpatient Hospital Stay (HOSPITAL_BASED_OUTPATIENT_CLINIC_OR_DEPARTMENT_OTHER): Payer: Medicare Other | Admitting: Hematology and Oncology

## 2024-05-25 VITALS — BP 135/69 | HR 82 | Temp 98.1°F | Resp 18 | Ht 69.0 in | Wt 183.0 lb

## 2024-05-25 DIAGNOSIS — N1831 Chronic kidney disease, stage 3a: Secondary | ICD-10-CM | POA: Diagnosis not present

## 2024-05-25 DIAGNOSIS — Z7901 Long term (current) use of anticoagulants: Secondary | ICD-10-CM | POA: Diagnosis not present

## 2024-05-25 DIAGNOSIS — N183 Chronic kidney disease, stage 3 unspecified: Secondary | ICD-10-CM | POA: Insufficient documentation

## 2024-05-25 DIAGNOSIS — D696 Thrombocytopenia, unspecified: Secondary | ICD-10-CM

## 2024-05-25 DIAGNOSIS — D472 Monoclonal gammopathy: Secondary | ICD-10-CM

## 2024-05-25 NOTE — Assessment & Plan Note (Addendum)
 We discussed association between CKD and anemia chronic kidney disease We discussed importance of adequate hydration and risk factor modification I do not believe the CKD is related to MGUS

## 2024-05-25 NOTE — Assessment & Plan Note (Addendum)
 The cause could be related to his underlying medical condition. It is mild and there is little change compared from previous platelet count.  I will observe for now.  he does not require further evaluation or treatment for this There is no contraindication to remain on antiplatelet agents or anticoagulants as long as the platelet is greater than 50,000.

## 2024-05-25 NOTE — Assessment & Plan Note (Addendum)
 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

## 2024-05-25 NOTE — Progress Notes (Signed)
 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 It is mild and there is little change compared from previous platelet count.  I will observe for now.  he does not require further evaluation or treatment for this There is no contraindication to remain on antiplatelet agents or anticoagulants as long as the platelet is greater than 50,000.    Orders Placed This Encounter  Procedures   CMP (Cancer Center only)    Standing Status:   Future    Expiration Date:   05/25/2025   CBC with Differential (Cancer Center Only)    Standing Status:   Future    Expiration Date:   05/25/2025   Kappa/lambda light chains    Standing Status:   Future    Expiration Date:   05/25/2025   Multiple Myeloma Panel (SPEP&IFE w/QIG)    Standing Status:   Future    Expiration Date:   05/25/2025     INTERVAL HISTORY: he returns for surveillance follow-up for diagnosis of MGUS Patient denies recurrent infection or bone pain We reviewed recent CBC,  CMP and myeloma panel results Since our last visit, he had cholecystectomy and ablation He is on chronic anticoagulation therapy and has significant bleeding several weeks ago  PHYSICAL EXAMINATION: ECOG PERFORMANCE STATUS: 0 - Asymptomatic  Vitals:   05/25/24 0753  BP: 135/69  Pulse: 82  Resp: 18  Temp: 98.1 F (36.7 C)  SpO2: 99%

## 2024-06-07 DIAGNOSIS — G5603 Carpal tunnel syndrome, bilateral upper limbs: Secondary | ICD-10-CM | POA: Diagnosis not present

## 2024-06-22 ENCOUNTER — Ambulatory Visit (INDEPENDENT_AMBULATORY_CARE_PROVIDER_SITE_OTHER): Payer: Self-pay

## 2024-06-22 DIAGNOSIS — I495 Sick sinus syndrome: Secondary | ICD-10-CM

## 2024-06-22 LAB — CUP PACEART REMOTE DEVICE CHECK
Battery Remaining Longevity: 106 mo
Battery Voltage: 3 V
Brady Statistic AP VP Percent: 98.93 %
Brady Statistic AP VS Percent: 0.01 %
Brady Statistic AS VP Percent: 0.86 %
Brady Statistic AS VS Percent: 0.2 %
Brady Statistic RA Percent Paced: 99.08 %
Brady Statistic RV Percent Paced: 99.78 %
Date Time Interrogation Session: 20250626211624
Implantable Lead Connection Status: 753985
Implantable Lead Connection Status: 753985
Implantable Lead Connection Status: 753985
Implantable Lead Implant Date: 20170517
Implantable Lead Implant Date: 20170517
Implantable Lead Implant Date: 20230928
Implantable Lead Location: 753858
Implantable Lead Location: 753859
Implantable Lead Location: 753860
Implantable Lead Model: 4398
Implantable Lead Model: 5076
Implantable Lead Model: 5076
Implantable Pulse Generator Implant Date: 20230928
Lead Channel Impedance Value: 285 Ohm
Lead Channel Impedance Value: 323 Ohm
Lead Channel Impedance Value: 323 Ohm
Lead Channel Impedance Value: 342 Ohm
Lead Channel Impedance Value: 361 Ohm
Lead Channel Impedance Value: 380 Ohm
Lead Channel Impedance Value: 380 Ohm
Lead Channel Impedance Value: 399 Ohm
Lead Channel Impedance Value: 513 Ohm
Lead Channel Impedance Value: 513 Ohm
Lead Channel Impedance Value: 532 Ohm
Lead Channel Impedance Value: 570 Ohm
Lead Channel Impedance Value: 608 Ohm
Lead Channel Impedance Value: 608 Ohm
Lead Channel Pacing Threshold Amplitude: 0.625 V
Lead Channel Pacing Threshold Amplitude: 0.75 V
Lead Channel Pacing Threshold Amplitude: 0.875 V
Lead Channel Pacing Threshold Pulse Width: 0.4 ms
Lead Channel Pacing Threshold Pulse Width: 0.4 ms
Lead Channel Pacing Threshold Pulse Width: 0.6 ms
Lead Channel Sensing Intrinsic Amplitude: 11.875 mV
Lead Channel Sensing Intrinsic Amplitude: 11.875 mV
Lead Channel Sensing Intrinsic Amplitude: 2.125 mV
Lead Channel Sensing Intrinsic Amplitude: 2.125 mV
Lead Channel Setting Pacing Amplitude: 1.5 V
Lead Channel Setting Pacing Amplitude: 1.5 V
Lead Channel Setting Pacing Amplitude: 2 V
Lead Channel Setting Pacing Pulse Width: 0.4 ms
Lead Channel Setting Pacing Pulse Width: 0.6 ms
Lead Channel Setting Sensing Sensitivity: 1.2 mV
Zone Setting Status: 755011
Zone Setting Status: 755011

## 2024-06-24 ENCOUNTER — Ambulatory Visit: Payer: Self-pay | Admitting: Cardiology

## 2024-06-30 ENCOUNTER — Other Ambulatory Visit: Payer: Self-pay | Admitting: Nurse Practitioner

## 2024-06-30 DIAGNOSIS — I4891 Unspecified atrial fibrillation: Secondary | ICD-10-CM

## 2024-07-02 NOTE — Telephone Encounter (Signed)
 Prescription refill request for Eliquis  received. Indication: PAF Last office visit: 05/08/24  CHRISTELLA Passey PA-C Scr: 1.32 on 05/15/24  Epic Age: 83 Weight: 86.6kg  Based on above findings Eliquis  5mg  twice daily is the appropriate dose.  Refill approved.

## 2024-07-05 ENCOUNTER — Other Ambulatory Visit: Payer: Self-pay | Admitting: Cardiovascular Disease

## 2024-07-07 ENCOUNTER — Encounter: Payer: Self-pay | Admitting: Cardiovascular Disease

## 2024-07-09 ENCOUNTER — Other Ambulatory Visit: Payer: Self-pay

## 2024-07-09 MED ORDER — TRIAMTERENE-HCTZ 37.5-25 MG PO TABS: 1.0000 | ORAL_TABLET | Freq: Every day | ORAL | 3 refills | Status: AC

## 2024-07-11 DIAGNOSIS — E663 Overweight: Secondary | ICD-10-CM | POA: Diagnosis not present

## 2024-07-11 DIAGNOSIS — M4810 Ankylosing hyperostosis [Forestier], site unspecified: Secondary | ICD-10-CM | POA: Diagnosis not present

## 2024-07-11 DIAGNOSIS — M25561 Pain in right knee: Secondary | ICD-10-CM | POA: Diagnosis not present

## 2024-07-11 DIAGNOSIS — D860 Sarcoidosis of lung: Secondary | ICD-10-CM | POA: Diagnosis not present

## 2024-07-11 DIAGNOSIS — M25562 Pain in left knee: Secondary | ICD-10-CM | POA: Diagnosis not present

## 2024-07-11 DIAGNOSIS — G6181 Chronic inflammatory demyelinating polyneuritis: Secondary | ICD-10-CM | POA: Diagnosis not present

## 2024-07-11 DIAGNOSIS — Z6828 Body mass index (BMI) 28.0-28.9, adult: Secondary | ICD-10-CM | POA: Diagnosis not present

## 2024-07-11 DIAGNOSIS — D472 Monoclonal gammopathy: Secondary | ICD-10-CM | POA: Diagnosis not present

## 2024-07-11 DIAGNOSIS — M1991 Primary osteoarthritis, unspecified site: Secondary | ICD-10-CM | POA: Diagnosis not present

## 2024-07-19 DIAGNOSIS — M5412 Radiculopathy, cervical region: Secondary | ICD-10-CM | POA: Diagnosis not present

## 2024-07-19 DIAGNOSIS — G5603 Carpal tunnel syndrome, bilateral upper limbs: Secondary | ICD-10-CM | POA: Diagnosis not present

## 2024-08-08 DIAGNOSIS — Z96651 Presence of right artificial knee joint: Secondary | ICD-10-CM | POA: Diagnosis not present

## 2024-08-08 DIAGNOSIS — M25562 Pain in left knee: Secondary | ICD-10-CM | POA: Diagnosis not present

## 2024-08-27 NOTE — Progress Notes (Unsigned)
 08/22/23- 82 yoM never smoker for sleep evaluation - OSA on CPAP Medical problem list includes- PVTach, PAFib, CAD/CABG/ MI/Pacemaker,  HTN, Asthma, Allergic Rhinitis, Colon Cancer, GERD, MGUS, Neuropathy, Osteoarthritis, DISH, DDD, BPH, PulmonarySarcoid, Glaucoma, Overweight,  CPAP from  [facility]/ Dr Chalice- motor life exceeded NPSG split night [facility]/ [facility] 11/28/17- AHI 33.3/ hr, desaturation to 79%, weight 194 lbs, to CPAP 9 CPAP [facility] new 12/09/17 Epworth score- Body weight today-198 lbs He has used CPAP over 20 years and says he can't sleep without it.  Thinks upper pressure was 15. Got recent resupply message from [employer]. We reached out to [employer] but unable to get download. This machine is over 80 years old. His Sarcoid was managed in [place], [place]  and he thinks it is inactive. If it should cause future problems, he wanted to have a Pulmonary office to turn to. Mild intermittent asthma is managed with occ use of albuterol  hfa and Flovent  diskus. Hx ENT surgery for tonsils. He suspects possible nasal fx as child- narrower on Left.  08/28/24-83 yoM followed for OSA Medical problem list includes- PVTach, PAFib, CAD/CABG/ MI/Pacemaker,  Aortic Stenosis, HTN, Asthma, Allergic Rhinitis, Colon Cancer, GERD, MGUS, Neuropathy, Osteoarthritis, DISH, DDD, BPH, PulmonarySarcoid, Glaucoma, Overweight,  CPAP auto 5-15/ [facility] replaced 08/22/23 Download compliance Body weight today   ROS-see HPI   + = positive Constitutional:    weight loss, night sweats, fevers, chills, fatigue, lassitude. HEENT:    headaches, difficulty swallowing, tooth/dental problems, sore throat,       sneezing, itching, ear ache, nasal congestion, post nasal drip, snoring CV:    chest pain, orthopnea, PND, swelling in lower extremities, anasarca,                     dizziness, palpitations Resp:   shortness of breath with exertion or at rest.                productive cough,   non-productive cough, coughing up of blood.               change in color of mucus.  wheezing.   Skin:    rash or lesions. GI:  No-   heartburn, indigestion, abdominal pain, nausea, vomiting, diarrhea,                 change in bowel habits, loss of appetite GU: dysuria, change in color of urine, no urgency or frequency.   flank pain. MS:   joint pain, stiffness, decreased range of motion, back pain. Neuro-     nothing unusual Psych:  change in mood or affect.  depression or anxiety.   memory loss.  OBJ- Physical Exam General- Alert, Oriented, Affect-appropriate, Distress- none acute, +not obese Skin- rash-none, lesions- none, excoriation- none Lymphadenopathy- none Head- atraumatic            Eyes- Gross vision intact, PERRLA, conjunctivae and secretions clear            Ears- Hearing, canals-normal            Nose- Clear, no-Septal dev, mucus, polyps, erosion, perforation             Throat- Mallampati II , mucosa clear , drainage- none, tonsils- atrophic Neck- flexible , trachea midline, no stridor , thyroid  nl, carotid no bruit Chest - symmetrical excursion , unlabored           Heart/CV- RRR ,  murmur+2/6 S , no gallop  , no rub, nl s1 s2                           -  JVD- none , edema- none, stasis changes- none, varices- none           Lung- clear to P&A, wheeze- none, cough- none , dullness-none, rub- none           Chest wall-  Abd-  Br/ Gen/ Rectal- Not done, not indicated Extrem- cyanosis- none, clubbing, none, atrophy- none, strength- nl Neuro- grossly intact to observation

## 2024-08-28 ENCOUNTER — Ambulatory Visit (INDEPENDENT_AMBULATORY_CARE_PROVIDER_SITE_OTHER): Payer: Medicare Other | Admitting: Internal Medicine

## 2024-08-28 VITALS — BP 146/76 | HR 82 | Ht 69.0 in | Wt 184.3 lb

## 2024-08-28 DIAGNOSIS — G5603 Carpal tunnel syndrome, bilateral upper limbs: Secondary | ICD-10-CM | POA: Diagnosis not present

## 2024-08-28 DIAGNOSIS — G4733 Obstructive sleep apnea (adult) (pediatric): Secondary | ICD-10-CM | POA: Diagnosis not present

## 2024-08-28 NOTE — Patient Instructions (Signed)
 Glad you are doing well.  We can continue CPAP auto 5-15

## 2024-08-30 NOTE — Progress Notes (Signed)
 Remote pacemaker transmission.

## 2024-08-31 ENCOUNTER — Encounter: Payer: Self-pay | Admitting: Internal Medicine

## 2024-09-06 DIAGNOSIS — Z23 Encounter for immunization: Secondary | ICD-10-CM | POA: Diagnosis not present

## 2024-09-21 ENCOUNTER — Ambulatory Visit: Payer: Self-pay | Admitting: Cardiology

## 2024-09-21 ENCOUNTER — Ambulatory Visit: Payer: Self-pay

## 2024-09-21 DIAGNOSIS — I495 Sick sinus syndrome: Secondary | ICD-10-CM | POA: Diagnosis not present

## 2024-09-21 LAB — CUP PACEART REMOTE DEVICE CHECK
Battery Remaining Longevity: 104 mo
Battery Voltage: 3 V
Brady Statistic AP VP Percent: 98.93 %
Brady Statistic AP VS Percent: 0.01 %
Brady Statistic AS VP Percent: 0.98 %
Brady Statistic AS VS Percent: 0.08 %
Brady Statistic RA Percent Paced: 98.93 %
Brady Statistic RV Percent Paced: 99.9 %
Date Time Interrogation Session: 20250925210956
Implantable Lead Connection Status: 753985
Implantable Lead Connection Status: 753985
Implantable Lead Connection Status: 753985
Implantable Lead Implant Date: 20170517
Implantable Lead Implant Date: 20170517
Implantable Lead Implant Date: 20230928
Implantable Lead Location: 753858
Implantable Lead Location: 753859
Implantable Lead Location: 753860
Implantable Lead Model: 4398
Implantable Lead Model: 5076
Implantable Lead Model: 5076
Implantable Pulse Generator Implant Date: 20230928
Lead Channel Impedance Value: 304 Ohm
Lead Channel Impedance Value: 342 Ohm
Lead Channel Impedance Value: 342 Ohm
Lead Channel Impedance Value: 361 Ohm
Lead Channel Impedance Value: 380 Ohm
Lead Channel Impedance Value: 380 Ohm
Lead Channel Impedance Value: 399 Ohm
Lead Channel Impedance Value: 399 Ohm
Lead Channel Impedance Value: 513 Ohm
Lead Channel Impedance Value: 551 Ohm
Lead Channel Impedance Value: 570 Ohm
Lead Channel Impedance Value: 608 Ohm
Lead Channel Impedance Value: 627 Ohm
Lead Channel Impedance Value: 627 Ohm
Lead Channel Pacing Threshold Amplitude: 0.625 V
Lead Channel Pacing Threshold Amplitude: 1 V
Lead Channel Pacing Threshold Amplitude: 1 V
Lead Channel Pacing Threshold Pulse Width: 0.4 ms
Lead Channel Pacing Threshold Pulse Width: 0.4 ms
Lead Channel Pacing Threshold Pulse Width: 0.6 ms
Lead Channel Sensing Intrinsic Amplitude: 1.375 mV
Lead Channel Sensing Intrinsic Amplitude: 1.375 mV
Lead Channel Sensing Intrinsic Amplitude: 11.875 mV
Lead Channel Sensing Intrinsic Amplitude: 11.875 mV
Lead Channel Setting Pacing Amplitude: 1.5 V
Lead Channel Setting Pacing Amplitude: 1.5 V
Lead Channel Setting Pacing Amplitude: 2 V
Lead Channel Setting Pacing Pulse Width: 0.4 ms
Lead Channel Setting Pacing Pulse Width: 0.6 ms
Lead Channel Setting Sensing Sensitivity: 1.2 mV
Zone Setting Status: 755011
Zone Setting Status: 755011

## 2024-09-26 NOTE — Progress Notes (Signed)
 Remote PPM Transmission

## 2024-10-26 DIAGNOSIS — H31012 Macula scars of posterior pole (postinflammatory) (post-traumatic), left eye: Secondary | ICD-10-CM | POA: Diagnosis not present

## 2024-10-26 DIAGNOSIS — Z961 Presence of intraocular lens: Secondary | ICD-10-CM | POA: Diagnosis not present

## 2024-10-26 DIAGNOSIS — H35371 Puckering of macula, right eye: Secondary | ICD-10-CM | POA: Diagnosis not present

## 2024-10-26 DIAGNOSIS — H43813 Vitreous degeneration, bilateral: Secondary | ICD-10-CM | POA: Diagnosis not present

## 2024-10-26 DIAGNOSIS — H401132 Primary open-angle glaucoma, bilateral, moderate stage: Secondary | ICD-10-CM | POA: Diagnosis not present

## 2024-10-26 DIAGNOSIS — H04123 Dry eye syndrome of bilateral lacrimal glands: Secondary | ICD-10-CM | POA: Diagnosis not present

## 2024-11-05 ENCOUNTER — Encounter: Admitting: Cardiology

## 2024-11-06 ENCOUNTER — Ambulatory Visit: Payer: Self-pay | Admitting: Family Medicine

## 2024-11-06 ENCOUNTER — Other Ambulatory Visit (INDEPENDENT_AMBULATORY_CARE_PROVIDER_SITE_OTHER): Payer: Medicare Other

## 2024-11-06 DIAGNOSIS — N289 Disorder of kidney and ureter, unspecified: Secondary | ICD-10-CM

## 2024-11-06 LAB — RENAL FUNCTION PANEL
Albumin: 4.2 g/dL (ref 3.5–5.2)
BUN: 19 mg/dL (ref 6–23)
CO2: 28 meq/L (ref 19–32)
Calcium: 10.1 mg/dL (ref 8.4–10.5)
Chloride: 102 meq/L (ref 96–112)
Creatinine, Ser: 1.04 mg/dL (ref 0.40–1.50)
GFR: 67.3 mL/min (ref >60.00)
Glucose, Bld: 93 mg/dL (ref 70–99)
Phosphorus: 3 mg/dL (ref 2.3–4.6)
Potassium: 4.6 meq/L (ref 3.5–5.1)
Sodium: 137 meq/L (ref 135–145)

## 2024-11-07 ENCOUNTER — Other Ambulatory Visit: Payer: Self-pay | Admitting: Family Medicine

## 2024-11-07 ENCOUNTER — Encounter: Payer: Self-pay | Admitting: Family Medicine

## 2024-11-07 DIAGNOSIS — D508 Other iron deficiency anemias: Secondary | ICD-10-CM

## 2024-11-07 DIAGNOSIS — E785 Hyperlipidemia, unspecified: Secondary | ICD-10-CM

## 2024-11-07 DIAGNOSIS — E559 Vitamin D deficiency, unspecified: Secondary | ICD-10-CM

## 2024-11-08 ENCOUNTER — Ambulatory Visit: Payer: Self-pay | Admitting: Family Medicine

## 2024-11-08 ENCOUNTER — Other Ambulatory Visit (INDEPENDENT_AMBULATORY_CARE_PROVIDER_SITE_OTHER)

## 2024-11-08 DIAGNOSIS — E559 Vitamin D deficiency, unspecified: Secondary | ICD-10-CM

## 2024-11-08 DIAGNOSIS — E785 Hyperlipidemia, unspecified: Secondary | ICD-10-CM

## 2024-11-08 DIAGNOSIS — D508 Other iron deficiency anemias: Secondary | ICD-10-CM

## 2024-11-08 LAB — CBC WITH DIFFERENTIAL/PLATELET
Basophils Absolute: 0 K/uL (ref 0.0–0.1)
Basophils Relative: 0.6 % (ref 0.0–3.0)
Eosinophils Absolute: 0.4 K/uL (ref 0.0–0.7)
Eosinophils Relative: 6.8 % — ABNORMAL HIGH (ref 0.0–5.0)
HCT: 33.7 % — ABNORMAL LOW (ref 39.0–52.0)
Hemoglobin: 11.6 g/dL — ABNORMAL LOW (ref 13.0–17.0)
Lymphocytes Relative: 13.1 % (ref 12.0–46.0)
Lymphs Abs: 0.8 K/uL (ref 0.7–4.0)
MCHC: 34.3 g/dL (ref 30.0–36.0)
MCV: 94.1 fl (ref 78.0–100.0)
Monocytes Absolute: 0.5 K/uL (ref 0.1–1.0)
Monocytes Relative: 9.1 % (ref 3.0–12.0)
Neutro Abs: 4 K/uL (ref 1.4–7.7)
Neutrophils Relative %: 70.4 % (ref 43.0–77.0)
Platelets: 173 K/uL (ref 150.0–400.0)
RBC: 3.58 Mil/uL — ABNORMAL LOW (ref 4.22–5.81)
RDW: 13.6 % (ref 11.5–15.5)
WBC: 5.7 K/uL (ref 4.0–10.5)

## 2024-11-08 LAB — IBC + FERRITIN
Ferritin: 51.4 ng/mL (ref 22.0–322.0)
Iron: 70 ug/dL (ref 42–165)
Saturation Ratios: 18 % — ABNORMAL LOW (ref 20.0–50.0)
TIBC: 389.2 ug/dL (ref 250.0–450.0)
Transferrin: 278 mg/dL (ref 212.0–360.0)

## 2024-11-08 LAB — BASIC METABOLIC PANEL WITH GFR
BUN: 24 mg/dL — ABNORMAL HIGH (ref 6–23)
CO2: 27 meq/L (ref 19–32)
Calcium: 10.1 mg/dL (ref 8.4–10.5)
Chloride: 102 meq/L (ref 96–112)
Creatinine, Ser: 1.36 mg/dL (ref 0.40–1.50)
GFR: 48.77 mL/min — ABNORMAL LOW (ref >60.00)
Glucose, Bld: 59 mg/dL — ABNORMAL LOW (ref 70–99)
Potassium: 4.4 meq/L (ref 3.5–5.1)
Sodium: 138 meq/L (ref 135–145)

## 2024-11-08 LAB — LIPID PANEL
Cholesterol: 144 mg/dL (ref 0–200)
HDL: 62 mg/dL
LDL Cholesterol: 70 mg/dL (ref 0–99)
NonHDL: 81.85
Total CHOL/HDL Ratio: 2
Triglycerides: 58 mg/dL (ref 0.0–149.0)
VLDL: 11.6 mg/dL (ref 0.0–40.0)

## 2024-11-08 LAB — VITAMIN D 25 HYDROXY (VIT D DEFICIENCY, FRACTURES): VITD: 41.21 ng/mL (ref 30.00–100.00)

## 2024-11-08 NOTE — Progress Notes (Signed)
 No critical labs need to be addressed urgently. We will discuss labs in detail at upcoming office visit.

## 2024-11-13 ENCOUNTER — Encounter: Payer: Self-pay | Admitting: Family Medicine

## 2024-11-13 ENCOUNTER — Ambulatory Visit: Payer: Medicare Other | Admitting: Family Medicine

## 2024-11-13 VITALS — BP 110/70 | HR 93 | Temp 98.3°F | Ht 67.5 in | Wt 183.5 lb

## 2024-11-13 DIAGNOSIS — N410 Acute prostatitis: Secondary | ICD-10-CM | POA: Diagnosis not present

## 2024-11-13 DIAGNOSIS — Z85038 Personal history of other malignant neoplasm of large intestine: Secondary | ICD-10-CM

## 2024-11-13 DIAGNOSIS — E559 Vitamin D deficiency, unspecified: Secondary | ICD-10-CM

## 2024-11-13 DIAGNOSIS — N401 Enlarged prostate with lower urinary tract symptoms: Secondary | ICD-10-CM | POA: Diagnosis not present

## 2024-11-13 DIAGNOSIS — M4810 Ankylosing hyperostosis [Forestier], site unspecified: Secondary | ICD-10-CM

## 2024-11-13 DIAGNOSIS — E785 Hyperlipidemia, unspecified: Secondary | ICD-10-CM

## 2024-11-13 DIAGNOSIS — I480 Paroxysmal atrial fibrillation: Secondary | ICD-10-CM

## 2024-11-13 DIAGNOSIS — I5032 Chronic diastolic (congestive) heart failure: Secondary | ICD-10-CM | POA: Diagnosis not present

## 2024-11-13 DIAGNOSIS — Z95811 Presence of heart assist device: Secondary | ICD-10-CM | POA: Insufficient documentation

## 2024-11-13 DIAGNOSIS — R972 Elevated prostate specific antigen [PSA]: Secondary | ICD-10-CM | POA: Diagnosis not present

## 2024-11-13 DIAGNOSIS — G6181 Chronic inflammatory demyelinating polyneuritis: Secondary | ICD-10-CM | POA: Diagnosis not present

## 2024-11-13 DIAGNOSIS — H93A1 Pulsatile tinnitus, right ear: Secondary | ICD-10-CM | POA: Diagnosis not present

## 2024-11-13 DIAGNOSIS — R3912 Poor urinary stream: Secondary | ICD-10-CM | POA: Diagnosis not present

## 2024-11-13 DIAGNOSIS — N1831 Chronic kidney disease, stage 3a: Secondary | ICD-10-CM

## 2024-11-13 NOTE — Progress Notes (Signed)
 Patient ID: Derrick [name], male    DOB: [birth year], 83 y.o.   MRN: [id number]  This visit was conducted in person.  BP 110/70   Pulse 93   Temp 98.3 F (36.8 C) (Temporal)   Ht 5' 7.5 (1.715 m)   Wt 183 lb 8 oz (83.2 kg)   SpO2 99%   BMI 28.32 kg/m    CC:  Chief Complaint  Patient presents with   Annual Exam    Annual Check-Up ( No CPE) MWV scheduled for 01/09/25    Subjective:   HPI: Derrick [name] is a 83 y.o. male presenting on 11/13/2024 for Annual Exam (Annual Check-Up ( No CPE)/MWV scheduled for 01/09/25)  The patient presents for annual  review of chronic health problems. He/She also for  the following acute concerns today: Has  noted intermittent sound of heart beat in right ear.. noted I last month.  2015  carotid doppler minimal disease. Family with history of carotid stenosis, severe. Mild hearing loss on recent testing at  audiology.  AMW planned 01/09/2025  CAD,Paroxsysmal Afib/ heart block: Followed by Dr. Wonda and Dr.Camnitz  S/P  Biventricular pacemaker  On eliquis  anticoagulation.  Has been in afib since June and scheduled in 01/2024 for ablation.  Elevated Cholesterol: Well-controlled crestor  20 mg daily Lab Results  Component Value Date   CHOL 144 11/08/2024   HDL 62.00 11/08/2024   LDLCALC 70 11/08/2024   TRIG 58.0 11/08/2024   CHOLHDL 2 11/08/2024  Using medications without problems:none Muscle aches: none Diet compliance: heart healthy diet Exercise: walking 2 miles daily Other complaints:     Hypertension:   Well controlled.  BP Readings from Last 3 Encounters:  11/13/24 110/70  08/28/24 (!) 146/76  05/25/24 135/69  Using medication without problems or lightheadedness:  none Chest pain with exertion: none Edema:none Short of breath: none Average home BPs: Other issues:  Anemia: followed by Dr. Lonn, hemoglobin stable a with slightly decreased  CKDa, GFR 48  MGUS, IgM Dx 2008:  Treated with ritoxin for 2 years  in [place] . Minimal risk of multiple myeloma.. released from  Oncologist.  Neuropathy followed by neurology...    DISH: diffuse.. Pain in right knee, hands, back, ankles  Followed by Rheum (Dr. Balinda).  Daily exercise.   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 Mild intermittent asthma Minimal symptoms, no longer on pulmicort  ( refilled to have on hand prn). Rarely using albuterol . Only issue is when he gets lung infection.. Needs inhaler.   OSA on CPAP.    Hx of colon cancer 2000   Relevant past medical, surgical, family and social history reviewed and updated as indicated. Interim medical history since our last visit reviewed. Allergies and medications reviewed and updated. Outpatient Medications Prior to Visit  Medication Sig Dispense Refill   amoxicillin  (AMOXIL ) 500 MG tablet TAKE 2 CAPSULES BY MOUTH ONE HOUR BEFORE APPOINTMENT AND 2 IMMEDIATELY AFTER THE APPOINTMENT     apixaban  (ELIQUIS ) 5 MG TABS tablet Take 1 tablet by mouth twice daily 180 tablet 1   bimatoprost (LUMIGAN) 0.01 % SOLN Place 1 drop into both eyes at bedtime.     Cholecalciferol  25 MCG (1000 UT) tablet Take 1,000 Units by mouth in the morning.     diclofenac Sodium (VOLTAREN) 1 % GEL Apply 2 g topically 4 (four) times daily as needed (knee pain).     dorzolamide -timolol  (COSOPT ) 22.3-6.8 MG/ML ophthalmic solution Place 1 drop into both eyes 2 (two)  times daily.      finasteride  (PROSCAR ) 5 MG tablet Take 5 mg by mouth in the morning.     fluticasone  (FLONASE ) 50 MCG/ACT nasal spray Place 2 sprays into both nostrils 2 (two) times daily as needed (nasal congestion.).     Fluticasone  Furoate (ARNUITY ELLIPTA ) 100 MCG/ACT AEPB Inhale 1 puff into the lungs 2 (two) times daily as needed.     metoprolol  succinate (TOPROL -XL) 50 MG 24 hr tablet Take 1 tablet (50 mg total) by mouth daily. Take with or immediately following a meal. 90 tablet 3   Multiple Vitamin (MULTIVITAMIN WITH MINERALS) TABS tablet  Take 1 tablet by mouth every evening.     pantoprazole  (PROTONIX ) 40 MG tablet Take 1 tablet (40 mg total) by mouth daily. 90 tablet 3   Polyethyl Glycol-Propyl Glycol (SYSTANE OP) Place 1 drop into both eyes 3 (three) times daily as needed (dry/irritated eyes.).     rosuvastatin  (CRESTOR ) 20 MG tablet Take 1 tablet by mouth once daily 90 tablet 3   sacubitril -valsartan  (ENTRESTO ) 24-26 MG Take 1 tablet by mouth twice daily 180 tablet 3   tadalafil  (CIALIS ) 5 MG tablet Take 5 mg by mouth at bedtime.      triamcinolone  cream (KENALOG ) 0.1 % Apply 1 Application topically 2 (two) times daily as needed (skin irritation).     triamterene -hydrochlorothiazide  (MAXZIDE -25) 37.5-25 MG tablet Take 1 tablet by mouth daily. 90 tablet 3   No facility-administered medications prior to visit.     Per HPI unless specifically indicated in ROS section below Review of Systems  Constitutional:  Negative for fatigue and fever.  HENT:  Negative for ear pain.   Eyes:  Negative for pain.  Respiratory:  Negative for cough and shortness of breath.   Cardiovascular:  Negative for chest pain, palpitations and leg swelling.  Gastrointestinal:  Negative for abdominal pain.  Genitourinary:  Negative for dysuria.  Musculoskeletal:  Negative for arthralgias.  Neurological:  Negative for syncope, light-headedness and headaches.  Psychiatric/Behavioral:  Negative for dysphoric mood.    Objective:  BP 110/70   Pulse 93   Temp 98.3 F (36.8 C) (Temporal)   Ht 5' 7.5 (1.715 m)   Wt 183 lb 8 oz (83.2 kg)   SpO2 99%   BMI 28.32 kg/m   Wt Readings from Last 3 Encounters:  11/13/24 183 lb 8 oz (83.2 kg)  08/28/24 184 lb 4.8 oz (83.6 kg)  05/25/24 183 lb (83 kg)      Physical Exam Constitutional:      General: He is not in acute distress.    Appearance: Normal appearance. He is well-developed. He is not ill-appearing or toxic-appearing.  HENT:     Head: Normocephalic and atraumatic.     Right Ear: Hearing,  tympanic membrane, ear canal and external ear normal.     Left Ear: Hearing, tympanic membrane, ear canal and external ear normal.     Nose: Nose normal.     Mouth/Throat:     Pharynx: Uvula midline.  Eyes:     General: Lids are normal. Lids are everted, no foreign bodies appreciated.     Conjunctiva/sclera: Conjunctivae normal.     Pupils: Pupils are equal, round, and reactive to light.  Neck:     Thyroid : No thyroid  mass or thyromegaly.     Vascular: No carotid bruit.     Trachea: Trachea and phonation normal.  Cardiovascular:     Rate and Rhythm: Normal rate and regular rhythm.  Pulses: Normal pulses.     Heart sounds: S1 normal and S2 normal. No murmur heard.    No gallop.  Pulmonary:     Breath sounds: Normal breath sounds. No wheezing, rhonchi or rales.  Abdominal:     General: Bowel sounds are normal.     Palpations: Abdomen is soft.     Tenderness: There is no abdominal tenderness. There is no guarding or rebound.     Hernia: No hernia is present.  Musculoskeletal:     Cervical back: Normal range of motion and neck supple.  Lymphadenopathy:     Cervical: No cervical adenopathy.  Skin:    General: Skin is warm and dry.     Findings: No rash.  Neurological:     Mental Status: He is alert.     Cranial Nerves: No cranial nerve deficit.     Sensory: No sensory deficit.     Gait: Gait normal.     Deep Tendon Reflexes: Reflexes are normal and symmetric.  Psychiatric:        Speech: Speech normal.        Behavior: Behavior normal.        Judgment: Judgment normal.       Results for orders placed or performed in visit on 11/08/24  Basic metabolic panel with GFR   Collection Time: 11/08/24 10:07 AM  Result Value Ref Range   Sodium 138 135 - 145 mEq/L   Potassium 4.4 3.5 - 5.1 mEq/L   Chloride 102 96 - 112 mEq/L   CO2 27 19 - 32 mEq/L   Glucose, Bld 59 (L) 70 - 99 mg/dL   BUN 24 (H) 6 - 23 mg/dL   Creatinine, Ser 8.63 0.40 - 1.50 mg/dL   GFR 51.22 (L) >39.99  mL/min   Calcium  10.1 8.4 - 10.5 mg/dL  VITAMIN D  25 Hydroxy (Vit-D Deficiency, Fractures)   Collection Time: 11/08/24 10:07 AM  Result Value Ref Range   VITD 41.21 30.00 - 100.00 ng/mL  Lipid panel   Collection Time: 11/08/24 10:07 AM  Result Value Ref Range   Cholesterol 144 0 - 200 mg/dL   Triglycerides 41.9 0.0 - 149.0 mg/dL   HDL 37.99 >60.99 mg/dL   VLDL 88.3 0.0 - 59.9 mg/dL   LDL Cholesterol 70 0 - 99 mg/dL   Total CHOL/HDL Ratio 2    NonHDL 81.85   CBC with Differential/Platelet   Collection Time: 11/08/24 10:07 AM  Result Value Ref Range   WBC 5.7 4.0 - 10.5 K/uL   RBC 3.58 (L) 4.22 - 5.81 Mil/uL   Hemoglobin 11.6 (L) 13.0 - 17.0 g/dL   HCT 66.2 (L) 60.9 - 47.9 %   MCV 94.1 78.0 - 100.0 fl   MCHC 34.3 30.0 - 36.0 g/dL   RDW 86.3 88.4 - 84.4 %   Platelets 173.0 150.0 - 400.0 K/uL   Neutrophils Relative % 70.4 43.0 - 77.0 %   Lymphocytes Relative 13.1 12.0 - 46.0 %   Monocytes Relative 9.1 3.0 - 12.0 %   Eosinophils Relative 6.8 (H) 0.0 - 5.0 %   Basophils Relative 0.6 0.0 - 3.0 %   Neutro Abs 4.0 1.4 - 7.7 K/uL   Lymphs Abs 0.8 0.7 - 4.0 K/uL   Monocytes Absolute 0.5 0.1 - 1.0 K/uL   Eosinophils Absolute 0.4 0.0 - 0.7 K/uL   Basophils Absolute 0.0 0.0 - 0.1 K/uL  IBC + Ferritin   Collection Time: 11/08/24 10:07 AM  Result Value Ref Range  Iron 70 42 - 165 ug/dL   Transferrin 721.9 787.9 - 360.0 mg/dL   Saturation Ratios 81.9 (L) 20.0 - 50.0 %   Ferritin 51.4 22.0 - 322.0 ng/mL   TIBC 389.2 250.0 - 450.0 mcg/dL   *Note: Due to a large number of results and/or encounters for the requested time period, some results have not been displayed. A complete set of results can be found in Results Review.     COVID 19 screen:  No recent travel or known exposure to COVID19 The patient denies respiratory symptoms of COVID 19 at this time. The importance of social distancing was discussed today.   Assessment and Plan The patient's preventative maintenance and  recommended screening tests for an annual wellness exam were reviewed in full today. Brought up to date unless services declined.  Counselled on the importance of diet, exercise, and its role in overall health and mortality. The patient's FH and SH was reviewed, including their home life, tobacco status, and drug and alcohol  status.   Smoking Status: nonsmoker ETOH/ drug use:  minimal/ none Vaccines: TDap, PNA uptodate, S/P COVID x 5, flu, PNA  20 every 5 years repeat given sarcoid  RSV 09/13/2022 Prostate Cancer Screen: BPH, hx of biopsy. PSA Followed by Dr. Renda Colon Cancer Screen:  Dr. Albertus Hx of colon cancer 11/2023 Colonoscopy . Repeat in 3 years. Hep C:  neg.   Problem List Items Addressed This Visit     (HFpEF) heart failure with preserved ejection fraction ([name])   Recent echo showed preserved ejection fraction.  Followed by cardiology      Atrial fibrillation ([name])   S/P ablation with Dr. Inocencio.  He remains on apixaban  for anticoagulation.      CIDP (chronic inflammatory demyelinating polyneuropathy) ([name])   He has history of CIDP with worsening neuropathy  Followed by Neurology.      CKD (chronic kidney disease), stage III ([name])    Chronic, fluctuating.      DISH (diffuse idiopathic skeletal hyperostosis)   Followed by Rheum (Dr. Balinda).   Exercising daily.      History of colon cancer   S/P resection.      Hyperlipidemia with target LDL less than 100   Well-controlled on Crestor  20 mg daily.      Presence of heart assist device ([name])   Pulsatile tinnitus of right ear    Acute, new Strong family history of carotid stenosis.  No bruit heard on exam.  Will evaluate with bilateral carotid ultrasound. If negative but continued pulsatile tinnitus will move forward with MRI angio to rule out vascular source.       Relevant Orders   VAS US  CAROTID   Vitamin D  deficiency - Primary   Now resolved on supplementation.      Other Visit Diagnoses        CKD stage 3a, GFR 45-59 ml/min ([name])             Greig Ring, MD

## 2024-11-13 NOTE — Assessment & Plan Note (Signed)
 S/P ablation with Dr. Inocencio.  He remains on apixaban  for anticoagulation.

## 2024-11-13 NOTE — Assessment & Plan Note (Signed)
 Recent echo showed preserved ejection fraction.  Followed by cardiology

## 2024-11-13 NOTE — Assessment & Plan Note (Signed)
 Now resolved on supplementation.

## 2024-11-13 NOTE — Assessment & Plan Note (Signed)
 Chronic, fluctuating.

## 2024-11-13 NOTE — Assessment & Plan Note (Signed)
 He has history of CIDP with worsening neuropathy  Followed by Neurology.

## 2024-11-13 NOTE — Assessment & Plan Note (Signed)
S/P resection.

## 2024-11-13 NOTE — Assessment & Plan Note (Signed)
 Acute, new Strong family history of carotid stenosis.  No bruit heard on exam.  Will evaluate with bilateral carotid ultrasound. If negative but continued pulsatile tinnitus will move forward with MRI angio to rule out vascular source.

## 2024-11-13 NOTE — Assessment & Plan Note (Signed)
 Followed by Rheum (Dr. Balinda).   Exercising daily.

## 2024-11-13 NOTE — Assessment & Plan Note (Signed)
 Well controlled on Crestor 20mg  daily

## 2024-11-16 ENCOUNTER — Encounter: Payer: Self-pay | Admitting: Family Medicine

## 2024-11-16 ENCOUNTER — Ambulatory Visit: Admitting: Cardiology

## 2024-11-16 DIAGNOSIS — Z23 Encounter for immunization: Secondary | ICD-10-CM | POA: Diagnosis not present

## 2024-11-20 ENCOUNTER — Ambulatory Visit: Admitting: Physician Assistant

## 2024-11-28 ENCOUNTER — Ambulatory Visit: Attending: Cardiology | Admitting: Cardiology

## 2024-11-28 ENCOUNTER — Encounter: Payer: Self-pay | Admitting: Cardiology

## 2024-11-28 VITALS — BP 120/70 | HR 85 | Ht 69.0 in | Wt 190.1 lb

## 2024-11-28 DIAGNOSIS — I442 Atrioventricular block, complete: Secondary | ICD-10-CM | POA: Insufficient documentation

## 2024-11-28 DIAGNOSIS — I4819 Other persistent atrial fibrillation: Secondary | ICD-10-CM | POA: Diagnosis not present

## 2024-11-28 DIAGNOSIS — D6869 Other thrombophilia: Secondary | ICD-10-CM | POA: Diagnosis not present

## 2024-11-28 DIAGNOSIS — I209 Angina pectoris, unspecified: Secondary | ICD-10-CM | POA: Diagnosis not present

## 2024-11-28 LAB — CUP PACEART INCLINIC DEVICE CHECK
Date Time Interrogation Session: 20251203124412
Implantable Lead Connection Status: 753985
Implantable Lead Connection Status: 753985
Implantable Lead Connection Status: 753985
Implantable Lead Implant Date: 20170517
Implantable Lead Implant Date: 20170517
Implantable Lead Implant Date: 20230928
Implantable Lead Location: 753858
Implantable Lead Location: 753859
Implantable Lead Location: 753860
Implantable Lead Model: 4398
Implantable Lead Model: 5076
Implantable Lead Model: 5076
Implantable Pulse Generator Implant Date: 20230928

## 2024-11-28 NOTE — Patient Instructions (Signed)
 Medication Instructions:  Your physician recommends that you continue on your current medications as directed. Please refer to the Current Medication list given to you today.  *If you need a refill on your cardiac medications before your next appointment, please call your pharmacy*  Lab Work: None ordered  If you have any lab test that is abnormal or we need to change your treatment, we will call you to review the results.  Testing/Procedures:    Please report to Radiology at the [facility] Main Entrance 30 minutes early for your test.  [address] [place], [place] [address]                         OR   Please report to Radiology at [facility] Main Entrance, medical mall, 30 mins prior to your test.  [address]  [place], [place]  How to Prepare for Your Cardiac PET/CT Stress Test:  Nothing to eat or drink, except water , 3 hours prior to arrival time.  NO caffeine/decaffeinated products, or chocolate 12 hours prior to arrival. (Please note decaffeinated beverages (teas/coffees) still contain caffeine).  If you have caffeine within 12 hours prior, the test will need to be rescheduled.  Medication instructions: Do not take erectile dysfunction medications for 72 hours prior to test (sildenafil, tadalafil ) Do not take nitrates (isosorbide  mononitrate, Ranexa) the day before or day of test Do not take tamsulosin the day before or morning of test Hold theophylline containing medications for 12 hours. Hold Dipyridamole 48 hours prior to the test.  Diabetic Preparation: If able to eat breakfast prior to 3 hour fasting, you may take all medications, including your insulin . Do not worry if you miss your breakfast dose of insulin  - start at your next meal. If you do not eat prior to 3 hour fast-Hold all diabetes (oral and insulin ) medications. Patients who wear a continuous glucose monitor MUST remove the device prior to scanning.  You may  take your remaining medications with water .  NO perfume, cologne or lotion on chest or abdomen area. FEMALES - Please avoid wearing dresses to this appointment.  Total time is 1 to 2 hours; you may want to bring reading material for the waiting time.  IF YOU THINK YOU MAY BE PREGNANT, OR ARE NURSING PLEASE INFORM THE TECHNOLOGIST.  In preparation for your appointment, medication and supplies will be purchased.  Appointment availability is limited, so if you need to cancel or reschedule, please call the Radiology Department Scheduler at [phone] 24 hours in advance to avoid a cancellation fee of $100.00  What to Expect When you Arrive:  Once you arrive and check in for your appointment, you will be taken to a preparation room within the Radiology Department.  A technologist or Nurse will obtain your medical history, verify that you are correctly prepped for the exam, and explain the procedure.  Afterwards, an IV will be started in your arm and electrodes will be placed on your skin for EKG monitoring during the stress portion of the exam. Then you will be escorted to the PET/CT scanner.  There, staff will get you positioned on the scanner and obtain a blood pressure and EKG.  During the exam, you will continue to be connected to the EKG and blood pressure machines.  A small, safe amount of a radioactive tracer will be injected in your IV to obtain a series of pictures of your heart along with  an injection of a stress agent.    After your Exam:  It is recommended that you eat a meal and drink a caffeinated beverage to counter act any effects of the stress agent.  Drink plenty of fluids for the remainder of the day and urinate frequently for the first couple of hours after the exam.  Your doctor will inform you of your test results within 7-10 business days.  For more information and frequently asked questions, please visit our website: [email or web address]  For questions about  your test or how to prepare for your test, please call: Cardiac Imaging Nurse Navigators Office: [phone]   Follow-Up: At [facility], you and your health needs are our priority.  As part of our continuing mission to provide you with exceptional heart care, our providers are all part of one team.  This team includes your primary Cardiologist (physician) and Advanced Practice Providers or APPs (Physician Assistants and Nurse Practitioners) who all work together to provide you with the care you need, when you need it.  Your next appointment:   1 year(s)  Provider:   You will see one of the following Advanced Practice Providers on your designated Care Team:   Charlies Arthur, PA-C Michael Andy Tillery, PA-C Suzann Riddle, NP Daphne Barrack, NP Artist Pouch, PA-C   Thank you for choosing [facility]!!   Maeola Domino, RN [phone]

## 2024-11-28 NOTE — Progress Notes (Signed)
 Electrophysiology Office Note:   Date:  11/28/2024  ID:  Derrick, [name] Apr 11, 1941, MRN [id number]  Primary Cardiologist: Ozell Fell, MD Primary Heart Failure: None Electrophysiologist: Adarian Bur Gladis Norton, MD      History of Present Illness:   Derrick [name] is a 83 y.o. male with h/o coronary artery disease post CABG, sinus node dysfunction, hypertension, hyperlipidemia, atrial fibrillation seen today for routine electrophysiology followup.   Since last being seen in our clinic the patient reports doing he has noted no episodes of atrial fibrillation since his ablation.  He is doing well.  He does continue to have intermittent phrenic stimulation from his coronary sinus lead, though he does not feel like it needs to be adjusted at this time.  He does walk with his wife and some friends.  We have walking with his friends, he has been walking faster and gets his heart rate up to 115 bpm.  During this time, he has some discomfort in his upper back.  Around the time of his heart attack, he had similar discomfort.  He is concerned that this may be a coronary equivalent.  he denies chest pain, palpitations, dyspnea, PND, orthopnea, nausea, vomiting, dizziness, syncope, edema, weight gain, or early satiety.   Review of systems complete and found to be negative unless listed in HPI.      EP Information / Studies Reviewed:    EKG is not ordered today. EKG from 05/08/2024 reviewed which showed AV paced      PPM Interrogation-  reviewed in detail today,  See PACEART report.  Device History: Medtronic BiV PPM implanted 2017, biventricular upgrade September 2023 for Symptomatic bradycardia  Risk Assessment/Calculations:    CHA2DS2-VASc Score = 5   This indicates a 7.2% annual risk of stroke. The patient's score is based upon: CHF History: 1 HTN History: 1 Diabetes History: 0 Stroke History: 0 Vascular Disease History: 1 Age Score: 2 Gender Score: 0             Physical  Exam:   VS:  BP 120/70 (BP Location: Right Arm, Patient Position: Sitting, Cuff Size: Normal)   Pulse 85   Ht 5' 9 (1.753 m)   Wt 190 lb 1.6 oz (86.2 kg)   SpO2 98%   BMI 28.07 kg/m    Wt Readings from Last 3 Encounters:  11/28/24 190 lb 1.6 oz (86.2 kg)  11/13/24 183 lb 8 oz (83.2 kg)  08/28/24 184 lb 4.8 oz (83.6 kg)     GEN: Well nourished, well developed in no acute distress NECK: No JVD; No carotid bruits CARDIAC: Regular rate and rhythm, no murmurs, rubs, gallops RESPIRATORY:  Clear to auscultation without rales, wheezing or rhonchi  ABDOMEN: Soft, non-tender, non-distended EXTREMITIES:  No edema; No deformity   ASSESSMENT AND PLAN:    Symptomatic bradycardia s/p Medtronic PPM  Normal PPM function See Pace Art report No changes today  2.  Persistent atrial fibrillation: Post ablation 02/07/2024.  No further episodes of atrial fibrillation.  No changes.  3.  Secondary hypercoagulable state: On Eliquis   4.  Coronary artery disease: He is having pain when he exerts himself that radiates to his back.  This was the same pain that he had when he had his MI.  This happens when he exerts himself and improves with rest.  Betzaida Cremeens plan for cardiac PET.  Gerell Fortson discuss with his primary cardiologist.  5.  Moderate to severe aortic stenosis: Plan per primary cardiology  Disposition:  Follow up with EP Team in 12 months  Signed, Alisha Bacus Gladis Norton, MD

## 2024-11-29 ENCOUNTER — Other Ambulatory Visit: Payer: Self-pay | Admitting: Family Medicine

## 2024-11-29 DIAGNOSIS — G5603 Carpal tunnel syndrome, bilateral upper limbs: Secondary | ICD-10-CM | POA: Diagnosis not present

## 2024-11-29 DIAGNOSIS — J4521 Mild intermittent asthma with (acute) exacerbation: Secondary | ICD-10-CM

## 2024-11-30 ENCOUNTER — Ambulatory Visit: Payer: Self-pay | Admitting: Family Medicine

## 2024-11-30 ENCOUNTER — Ambulatory Visit (HOSPITAL_BASED_OUTPATIENT_CLINIC_OR_DEPARTMENT_OTHER)

## 2024-11-30 DIAGNOSIS — I6523 Occlusion and stenosis of bilateral carotid arteries: Secondary | ICD-10-CM

## 2024-11-30 DIAGNOSIS — I2510 Atherosclerotic heart disease of native coronary artery without angina pectoris: Secondary | ICD-10-CM | POA: Diagnosis not present

## 2024-11-30 DIAGNOSIS — H93A1 Pulsatile tinnitus, right ear: Secondary | ICD-10-CM

## 2024-12-04 DIAGNOSIS — N5201 Erectile dysfunction due to arterial insufficiency: Secondary | ICD-10-CM | POA: Diagnosis not present

## 2024-12-18 ENCOUNTER — Other Ambulatory Visit: Payer: Self-pay | Admitting: Cardiovascular Disease

## 2024-12-18 DIAGNOSIS — I4891 Unspecified atrial fibrillation: Secondary | ICD-10-CM

## 2024-12-18 NOTE — Telephone Encounter (Signed)
 Eliquis  5mg  refill request received. Patient is 83 years old, weight-86.2kg, Crea-1.36 on 11/08/24, Diagnosis-Afib, and last seen by Dr Inocencio on 11/28/24 and Dr. Wonda on 01/20/24 and has appt pending 12/2024. Dose is appropriate based on dosing criteria. Will send in refill to requested pharmacy.

## 2024-12-21 ENCOUNTER — Ambulatory Visit (INDEPENDENT_AMBULATORY_CARE_PROVIDER_SITE_OTHER): Payer: Self-pay

## 2024-12-21 DIAGNOSIS — I495 Sick sinus syndrome: Secondary | ICD-10-CM | POA: Diagnosis not present

## 2024-12-24 LAB — CUP PACEART REMOTE DEVICE CHECK
Battery Remaining Longevity: 100 mo
Battery Voltage: 3 V
Brady Statistic AP VP Percent: 99.08 %
Brady Statistic AP VS Percent: 0.01 %
Brady Statistic AS VP Percent: 0.61 %
Brady Statistic AS VS Percent: 0.3 %
Brady Statistic RA Percent Paced: 99.34 %
Brady Statistic RV Percent Paced: 99.69 %
Date Time Interrogation Session: 20251227001058
Implantable Lead Connection Status: 753985
Implantable Lead Connection Status: 753985
Implantable Lead Connection Status: 753985
Implantable Lead Implant Date: 20170517
Implantable Lead Implant Date: 20170517
Implantable Lead Implant Date: 20230928
Implantable Lead Location: 753858
Implantable Lead Location: 753859
Implantable Lead Location: 753860
Implantable Lead Model: 4398
Implantable Lead Model: 5076
Implantable Lead Model: 5076
Implantable Pulse Generator Implant Date: 20230928
Lead Channel Impedance Value: 323 Ohm
Lead Channel Impedance Value: 342 Ohm
Lead Channel Impedance Value: 342 Ohm
Lead Channel Impedance Value: 361 Ohm
Lead Channel Impedance Value: 380 Ohm
Lead Channel Impedance Value: 399 Ohm
Lead Channel Impedance Value: 399 Ohm
Lead Channel Impedance Value: 418 Ohm
Lead Channel Impedance Value: 513 Ohm
Lead Channel Impedance Value: 551 Ohm
Lead Channel Impedance Value: 551 Ohm
Lead Channel Impedance Value: 627 Ohm
Lead Channel Impedance Value: 646 Ohm
Lead Channel Impedance Value: 646 Ohm
Lead Channel Pacing Threshold Amplitude: 0.625 V
Lead Channel Pacing Threshold Amplitude: 0.75 V
Lead Channel Pacing Threshold Amplitude: 1 V
Lead Channel Pacing Threshold Pulse Width: 0.4 ms
Lead Channel Pacing Threshold Pulse Width: 0.4 ms
Lead Channel Pacing Threshold Pulse Width: 0.6 ms
Lead Channel Sensing Intrinsic Amplitude: 1.375 mV
Lead Channel Sensing Intrinsic Amplitude: 1.375 mV
Lead Channel Sensing Intrinsic Amplitude: 16.375 mV
Lead Channel Sensing Intrinsic Amplitude: 16.375 mV
Lead Channel Setting Pacing Amplitude: 1.5 V
Lead Channel Setting Pacing Amplitude: 1.5 V
Lead Channel Setting Pacing Amplitude: 2 V
Lead Channel Setting Pacing Pulse Width: 0.4 ms
Lead Channel Setting Pacing Pulse Width: 0.6 ms
Lead Channel Setting Sensing Sensitivity: 1.2 mV
Zone Setting Status: 755011
Zone Setting Status: 755011

## 2024-12-25 ENCOUNTER — Ambulatory Visit: Payer: Self-pay | Admitting: Cardiology

## 2024-12-25 ENCOUNTER — Other Ambulatory Visit (HOSPITAL_COMMUNITY)

## 2024-12-26 NOTE — Progress Notes (Signed)
 Remote PPM Transmission

## 2024-12-31 ENCOUNTER — Encounter (HOSPITAL_COMMUNITY): Payer: Self-pay

## 2025-01-01 ENCOUNTER — Ambulatory Visit: Payer: Self-pay | Admitting: Cardiovascular Disease

## 2025-01-01 ENCOUNTER — Ambulatory Visit (HOSPITAL_COMMUNITY)
Admission: RE | Admit: 2025-01-01 | Discharge: 2025-01-01 | Disposition: A | Discharge status: Home / Self Care | Source: Ambulatory Visit | Attending: Cardiology | Admitting: Cardiology

## 2025-01-01 DIAGNOSIS — I350 Nonrheumatic aortic (valve) stenosis: Secondary | ICD-10-CM | POA: Insufficient documentation

## 2025-01-01 LAB — ECHOCARDIOGRAM COMPLETE
AR max vel: 1.53 cm2
AV Area VTI: 1.47 cm2
AV Area mean vel: 1.46 cm2
AV Mean grad: 28.5 mmHg
AV Peak grad: 49 mmHg
Ao pk vel: 3.5 m/s
Area-P 1/2: 3.37 cm2
MV M vel: 5.99 m/s
MV Peak grad: 143.5 mmHg
S' Lateral: 3.4 cm

## 2025-01-02 ENCOUNTER — Ambulatory Visit: Payer: Self-pay | Admitting: Cardiology

## 2025-01-02 ENCOUNTER — Ambulatory Visit (HOSPITAL_COMMUNITY)
Admission: RE | Admit: 2025-01-02 | Discharge: 2025-01-02 | Disposition: A | Discharge status: Home / Self Care | Source: Ambulatory Visit | Attending: Cardiology | Admitting: Cardiology

## 2025-01-02 DIAGNOSIS — I209 Angina pectoris, unspecified: Secondary | ICD-10-CM | POA: Diagnosis not present

## 2025-01-02 LAB — NM PET CT CARDIAC PERFUSION MULTI W/ABSOLUTE BLOODFLOW
LV dias vol: 116 mL (ref 62–150)
Nuc Rest EF: 53 %
Nuc Stress EF: 57 %
Peak HR: 106 {beats}/min
Rest HR: 86 {beats}/min
Rest Nuclear Isotope Dose: 21.7 mCi
ST Depression (mm): 0 mm
Stress Nuclear Isotope Dose: 21.7 mCi
TID: 1.11

## 2025-01-02 MED ORDER — REGADENOSON 0.4 MG/5ML IV SOLN
0.4000 mg | Freq: Once | INTRAVENOUS | Status: AC
  Administered 2025-01-02: 0.4 mg via INTRAVENOUS

## 2025-01-02 MED ORDER — REGADENOSON 0.4 MG/5ML IV SOLN
INTRAVENOUS | Status: AC
  Filled 2025-01-02: qty 5

## 2025-01-02 MED ORDER — RUBIDIUM RB82 GENERATOR (RUBYFILL)
21.6700 | PACK | Freq: Once | INTRAVENOUS | Status: AC
  Administered 2025-01-02: 21.67 via INTRAVENOUS

## 2025-01-02 MED ORDER — RUBIDIUM RB82 GENERATOR (RUBYFILL)
21.6900 | PACK | Freq: Once | INTRAVENOUS | Status: AC
  Administered 2025-01-02: 21.69 via INTRAVENOUS

## 2025-01-09 ENCOUNTER — Encounter

## 2025-01-14 ENCOUNTER — Ambulatory Visit: Attending: Cardiovascular Disease | Admitting: Cardiovascular Disease

## 2025-01-14 ENCOUNTER — Encounter: Payer: Self-pay | Admitting: Cardiovascular Disease

## 2025-01-14 VITALS — BP 120/58 | HR 66 | Ht 69.0 in | Wt 192.6 lb

## 2025-01-14 DIAGNOSIS — I10 Essential (primary) hypertension: Secondary | ICD-10-CM | POA: Diagnosis not present

## 2025-01-14 DIAGNOSIS — E782 Mixed hyperlipidemia: Secondary | ICD-10-CM | POA: Diagnosis not present

## 2025-01-14 DIAGNOSIS — I4819 Other persistent atrial fibrillation: Secondary | ICD-10-CM | POA: Insufficient documentation

## 2025-01-14 DIAGNOSIS — I442 Atrioventricular block, complete: Secondary | ICD-10-CM | POA: Diagnosis not present

## 2025-01-14 DIAGNOSIS — I25118 Atherosclerotic heart disease of native coronary artery with other forms of angina pectoris: Secondary | ICD-10-CM | POA: Diagnosis not present

## 2025-01-14 DIAGNOSIS — I350 Nonrheumatic aortic (valve) stenosis: Secondary | ICD-10-CM | POA: Insufficient documentation

## 2025-01-14 NOTE — Progress Notes (Signed)
 " Cardiology Office Note:    Date:  01/14/2025   ID:  Derrick [name], Derrick [name] 12/15/1941, MRN [id number]  PCP:  Avelina Greig BRAVO, MD   [facility] Providers Cardiologist:  Ozell Fell, MD Electrophysiologist:  Soyla Gladis Norton, MD     Referring MD: Avelina Greig BRAVO, MD   Chief Complaint  Patient presents with   Coronary Artery Disease    History of Present Illness:    Derrick [name] is a 84 y.o. 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 Recent echo shows LVEF 60-65%, normal RV function, severe LA dilatation, moderate AS with mean gradient 29 mmHg, AVA 1.4 square cm.   He is here alone today. He continues to experience of symptoms that remind him of his prior angina when he is walking briskly when his HR is > 115 bpm. He describes upper back discomfort.  This does not occur with low-level activity and resolves with rest.  He does occasionally have resting symptoms but states this is rare.  No orthopnea, PND, or heart palpitations.  No leg edema or claudication symptoms.  Current Medications: Active Medications[1]   Allergies:   Nsaids   ROS:   Please see the history of present illness.    All other systems reviewed and are negative.  EKGs/Labs/Other Studies Reviewed:    The following studies were reviewed today: Cardiac Studies & Procedures    ______________________________________________________________________________________________ CARDIAC CATHETERIZATION  CARDIAC CATHETERIZATION 05/15/2019  Conclusion  Previously placed Prox RCA to Mid RCA stent (unknown type) is widely patent.  Dist RCA lesion is 50% stenosed.  Mid LM to Ost LAD lesion is 70% stenosed.  Prox LAD lesion is 60% stenosed.  1. Severe distal left main stenosis, hemodynamically significant by DFR analysis 2. Moderate proximal LAD stenosis 3. Continued patency of the RCA stents 4. Nonobstructive LCx stenosis 5. Mild aortic stenosis with peak-to-peak gradient < 10 mmHg by pullback  Recommend: cardiac surgical consultation for consideration of CABG in this patient with symptomatic left main disease. Last echo 09/2018 should be updated to reevaluate severity of aortic stenosis  Findings Coronary Findings Diagnostic  Dominance: Right  Left Main The left main has 60 to 70% distal vessel stenosis with heavy calcification. DFR is performed and the resting DFR of the left main is 0.84 (ischemic threshold is 0.89). Mid LM to Ost LAD lesion is 70% stenosed. The lesion is eccentric. The lesion is calcified.  Left Anterior Descending The LAD is diffusely diseased.  The proximal vessel has moderate calcific stenosis. Prox LAD lesion is 60% stenosed. The lesion is calcified.  Right Coronary Artery Previously placed Prox RCA to Mid RCA stent (unknown type) is widely patent. The RCA stents are widely patent with no significant in-stent restenosis Dist RCA lesion is 50% stenosed.  Intervention  No interventions have been documented.   STRESS TESTS  NM PET CT CARDIAC PERFUSION MULTI W/ABSOLUTE BLOODFLOW 01/02/2025  Narrative   LV perfusion is normal. There is no evidence of ischemia. There is no evidence of infarction.  Rest left ventricular function is normal. Rest EF: 53%. Stress left ventricular function is normal. Stress EF: 57%. End diastolic cavity  size is normal.   Myocardial blood flow reserve is not reported in this patient due to technical or patient-specific concerns that affect accuracy (prior CABG).   Coronary calcium  assessment not performed due to prior revascularization.   The study is normal. The study is low risk.   Electronically signed by Darryle Decent, MD  EXAM: OVER-READ OF NON-CARDIAC STRUCTURES ON CARDIAC PET-CT 01/02/2025 08:21:49 AM  TECHNIQUE: cardiac PET CT was performed without iv contrast. Reporting of cardiac CT and cardiac PET findings under separate report by cardiologist.  COMPARISON: None available.  CLINICAL HISTORY: Chest pain/anginal equiv, intermediate CAD risk, not treadmill candidate.  FINDINGS:  Extracardiac vascular: Unremarkable.  Mediastinum: Unremarkable.  Lung: Calcified granuloma in the right middle lobe.  Upper abdomen: Unremarkable.  Musculoskeletal: Unremarkable.  IMPRESSION: 1. No significant extracardiac findings.  Electronically signed by: Norleen Boxer MD 01/02/2025 10:05 AM EST RP Workstation: [id number]   ECHOCARDIOGRAM  ECHOCARDIOGRAM COMPLETE 01/01/2025  Narrative ECHOCARDIOGRAM REPORT    Patient Name:   Derrick [name] Date of Exam: 01/01/2025 Medical Rec #:  [id number]         Height:       69.0 in Accession #:    [id number]        Weight:       190.1 lb Date of Birth:  Feb 04, 1941          BSA:          2.022 m Patient Age:    83 years          BP:           120/70 mmHg Patient Gender: M                 HR:           87 bpm. Exam Location:  [address]  Procedure: 2D Echo, Cardiac Doppler, Color Doppler and 3D Echo (Both Spectral and Color Flow Doppler were utilized during procedure).  Indications:    Aortic stenosis I35.0  History:        Patient has prior history of Echocardiogram examinations, most recent 11/10/2023. CHF, CAD and Previous Myocardial Infarction, Pacemaker, CKD and COPD, Arrythmias:Atrial Fibrillation;  Risk Factors:Hypertension and Dyslipidemia.  Sonographer:    Augustin Seals RDCS Referring Phys: [phone] Rydge Texidor  IMPRESSIONS   1. Left ventricular ejection fraction, by estimation, is 60 to 65%. Left ventricular ejection fraction by 3D volume is 61 %. The left ventricle has normal function. The left ventricle has no regional wall motion abnormalities. Left ventricular diastolic parameters are consistent with Grade III diastolic dysfunction (restrictive). Elevated left atrial pressure. 2. Right ventricular systolic function is normal. The right ventricular size is normal. There is normal pulmonary artery systolic pressure. 3. Left atrial size was severely dilated. 4. Mild to moderate mitral valve regurgitation. 5. AV is thickened, calcified with restricted motion Peak and mean gradients through the valve are 49 and 29 mm Hg respectively. AVA (VTI) is 1.47 cm2. By planimetry, AVA is 1.39 cm2. Dimensionless valve index is 0.39 Overall consistent with moderate AS. Compared to previous echo AV gradients have not signfiantly changed . The aortic valve is tricuspid. Aortic valve regurgitation is trivial. 6. Aortic dilatation noted. There is mild dilatation of the ascending aorta, measuring 44 mm.  FINDINGS Left Ventricle: Left ventricular ejection fraction, by estimation, is 60 to 65%. Left ventricular ejection  fraction by 3D volume is 61 %. The left ventricle has normal function. The left ventricle has no regional wall motion abnormalities. The left ventricular internal cavity size was normal in size. There is no left ventricular hypertrophy. Left ventricular diastolic parameters are consistent with Grade III diastolic dysfunction (restrictive). Elevated left atrial pressure.  Right Ventricle: The right ventricular size is normal. Right vetricular wall thickness was not assessed. Right ventricular systolic function is normal. There is normal pulmonary artery systolic pressure. The tricuspid  regurgitant velocity is 2.25 m/s, and with an assumed right atrial pressure of 8 mmHg, the estimated right ventricular systolic pressure is 28.2 mmHg.  Left Atrium: Left atrial size was severely dilated.  Right Atrium: Right atrial size was normal in size.  Pericardium: There is no evidence of pericardial effusion.  Mitral Valve: There is mild thickening of the mitral valve leaflet(s). Mild to moderate mitral annular calcification. Mild to moderate mitral valve regurgitation.  Tricuspid Valve: The tricuspid valve is normal in structure. Tricuspid valve regurgitation is mild.  Aortic Valve: AV is thickened, calcified with restricted motion Peak and mean gradients through the valve are 49 and 29 mm Hg respectively. AVA (VTI) is 1.47 cm2. By planimetry, AVA is 1.39 cm2. Dimensionless valve index is 0.39 Overall consistent with moderate AS. Compared to previous echo AV gradients have not signfiantly changed. The aortic valve is tricuspid. Aortic valve regurgitation is trivial. Aortic valve mean gradient measures 28.5 mmHg. Aortic valve peak gradient measures 49.0 mmHg. Aortic valve area, by VTI measures 1.47 cm.  Pulmonic Valve: The pulmonic valve was normal in structure. Pulmonic valve regurgitation is mild.  Aorta: The aortic root is normal in size and structure and aortic dilatation noted. There is mild dilatation of the ascending aorta, measuring 44 mm.  IAS/Shunts: No atrial level shunt detected by color flow Doppler.  Additional Comments: 3D was performed not requiring image post processing on an independent workstation and was normal. A device lead is visualized.   LEFT VENTRICLE PLAX 2D LVIDd:         4.90 cm         Diastology LVIDs:         3.40 cm         LV e' medial:    6.42 cm/s LV PW:         1.10 cm         LV E/e' medial:  20.2 LV IVS:        1.10 cm         LV e' lateral:   9.68 cm/s LVOT diam:     2.20 cm         LV E/e' lateral: 13.4 LV SV:         130 LV SV Index:    64 LVOT Area:     3.80 cm        3D Volume EF LV 3D EF:    Left ventricul ar ejection fraction by 3D volume is 61 %.  3D Volume EF: 3D EF:        61 % LV EDV:       146 ml LV ESV:       56 ml LV SV:        90 ml  RIGHT VENTRICLE            IVC RV Basal diam:  4.30 cm    IVC diam: 2.60 cm RV Mid diam:    3.00 cm RV S  prime:     9.57 cm/s  PULMONARY VEINS TAPSE (M-mode): 1.9 cm     A Reversal Velocity: 35.00 cm/s Diastolic Velocity:  69.10 cm/s S/D Velocity:        0.90 Systolic Velocity:   63.30 cm/s  LEFT ATRIUM              Index        RIGHT ATRIUM           Index LA diam:        5.30 cm  2.62 cm/m   RA Area:     25.30 cm LA Vol (A2C):   90.1 ml  44.56 ml/m  RA Volume:   78.30 ml  38.73 ml/m LA Vol (A4C):   103.0 ml 50.94 ml/m LA Biplane Vol: 100.0 ml 49.46 ml/m AORTIC VALVE AV Area (Vmax):    1.53 cm AV Area (Vmean):   1.46 cm AV Area (VTI):     1.47 cm AV Vmax:           350.00 cm/s AV Vmean:          252.000 cm/s AV VTI:            0.884 m AV Peak Grad:      49.0 mmHg AV Mean Grad:      28.5 mmHg LVOT Vmax:         141.00 cm/s LVOT Vmean:        96.750 cm/s LVOT VTI:          0.342 m LVOT/AV VTI ratio: 0.39  AORTA Ao Root diam: 3.40 cm Ao Asc diam:  4.40 cm  MITRAL VALVE                TRICUSPID VALVE MV Area (PHT): 3.37 cm     TR Peak grad:   20.2 mmHg MV Decel Time: 225 msec     TR Vmax:        225.00 cm/s MR Peak grad: 143.5 mmHg MR Mean grad: 92.0 mmHg     SHUNTS MR Vmax:      599.00 cm/s   Systemic VTI:  0.34 m MR Vmean:     456.0 cm/s    Systemic Diam: 2.20 cm MV E velocity: 130.00 cm/s MV A velocity: 60.40 cm/s MV E/A ratio:  2.15  Vina Gull MD Electronically signed by Vina Gull MD Signature Date/Time: 01/01/2025/2:08:25 PM    Final   TEE  ECHO INTRAOPERATIVE TEE 06/05/2019  Interpretation Summary   Tricuspid Valve: The tricuspid annulus is not dilated.   Left ventricle: Normal cavity size. Concentric hypertrophy of  moderate severity.   Left atrium: Left atrial appendage filling and emptying velocities are normal.   Aortic valve: Moderate valve thickening present. Mild valve calcification present. Mild stenosis.   Aorta: The ascending aorta is mildly dilated.   Mitral valve:  Mild mitral annular calcification. Trace regurgitation.   Right ventricle:  Normal cavity size, wall thickness and ejection fraction.   Right atrium:  Pacer wire present in the right atrium.   Tricuspid valve: Trace regurgitation.        ______________________________________________________________________________________________      EKG:        Recent Labs: 05/15/2024: ALT 20 11/08/2024: BUN 24; Creatinine, Ser 1.36; Hemoglobin 11.6; Platelets 173.0; Potassium 4.4; Sodium 138  Recent Lipid Panel    Component Value Date/Time   CHOL 144 11/08/2024 1007   CHOL 147 05/21/2020 0721   TRIG 58.0 11/08/2024 1007   HDL  62.00 11/08/2024 1007   HDL 66 05/21/2020 0721   CHOLHDL 2 11/08/2024 1007   VLDL 11.6 11/08/2024 1007   LDLCALC 70 11/08/2024 1007   LDLCALC 99 10/30/2021 1557     Risk Assessment/Calculations:    CHA2DS2-VASc Score = 5   This indicates a 7.2% annual risk of stroke. The patient's score is based upon: CHF History: 1 HTN History: 1 Diabetes History: 0 Stroke History: 0 Vascular Disease History: 1 Age Score: 2 Gender Score: 0               Physical Exam:    VS:  BP (!) 120/58 (BP Location: Left Arm)   Pulse 66   Ht 5' 9 (1.753 m)   Wt 192 lb 9.6 oz (87.4 kg)   SpO2 97%   BMI 28.44 kg/m     Wt Readings from Last 3 Encounters:  01/14/25 192 lb 9.6 oz (87.4 kg)  11/28/24 190 lb 1.6 oz (86.2 kg)  11/13/24 183 lb 8 oz (83.2 kg)     GEN:  Well nourished, well developed in no acute distress HEENT: Normal NECK: No JVD; No carotid bruits LYMPHATICS: No lymphadenopathy CARDIAC: RRR, 2/6 systolic murmur at the RUSB RESPIRATORY:  Clear to auscultation without rales, wheezing or rhonchi   ABDOMEN: Soft, non-tender, non-distended MUSCULOSKELETAL:  No edema; No deformity  SKIN: Warm and dry NEUROLOGIC:  Alert and oriented x 3 PSYCHIATRIC:  Normal affect   Assessment & Plan Moderate aortic stenosis Unchanged on recent echocardiogram.  Reviewed echo findings as above, clearly with his aortic stenosis remaining in the moderate range with a mean gradient of 29 mmHg and calculated aortic valve area of 1.4 cm.  Repeat echocardiogram in 1 year prior to his return office visit. Persistent atrial fibrillation ([name]) Followed by Dr. Inocencio.  Continue apixaban  for anticoagulation.  Continue metoprolol  succinate. Coronary artery disease of native artery of native heart with stable angina pectoris Recent stress-PET reassuring.  However, he describes pretty typical anginal symptoms.  I offered him additional antianginal therapy with isosorbide .  He declined and states that his symptoms are tolerable at present and he would prefer to not take an additional medication at this time.  He will notify us  in the future if his symptoms worsen and we would escalate his antianginal program if needed. Mixed hyperlipidemia Treated with rosuvastatin .  Lipids at goal with an LDL of 70. Essential hypertension Blood pressure well-controlled on Entresto , hydrochlorothiazide , and metoprolol  succinate. 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 Inocencio.            Medication Adjustments/Labs and Tests Ordered: Current medicines are reviewed at length with the patient today.  Concerns regarding medicines are outlined above.  Orders Placed This Encounter  Procedures   ECHOCARDIOGRAM COMPLETE   No orders of the defined types were placed in this encounter.   Patient Instructions  Medication Instructions:  No medication changes were made at this visit. Continue current regimen.   *If you need a refill on your cardiac medications before your next  appointment, please call your pharmacy*  Lab Work: None ordered today. If you have labs (blood work) drawn today and your tests are completely normal, you will receive your results only by: MyChart Message (if you have MyChart) OR A paper copy in the mail If you have any lab test that is abnormal or we need to change your treatment, we will call you to review the results.  Testing/Procedures: Your physician  has requested that you have an echocardiogram prior to 1 year follow-up with Dr. Wonda. Echocardiography is a painless test that uses sound waves to create images of your heart. It provides your doctor with information about the size and shape of your heart and how well your hearts chambers and valves are working. This procedure takes approximately one hour. There are no restrictions for this procedure. Please do NOT wear cologne, perfume, aftershave, or lotions (deodorant is allowed). Please arrive 15 minutes prior to your appointment time.  Please note: We ask at that you not bring children with you during ultrasound (echo/ vascular) testing. Due to room size and safety concerns, children are not allowed in the ultrasound rooms during exams. Our front office staff cannot provide observation of children in our lobby area while testing is being conducted. An adult accompanying a patient to their appointment will only be allowed in the ultrasound room at the discretion of the ultrasound technician under special circumstances. We apologize for any inconvenience.   Follow-Up: At [facility], you and your health needs are our priority.  As part of our continuing mission to provide you with exceptional heart care, our providers are all part of one team.  This team includes your primary Cardiologist (physician) and Advanced Practice Providers or APPs (Physician Assistants and Nurse Practitioners) who all work together to provide you with the care you need, when you need it.  Your next  appointment:   1 year(s)  Provider:   Ozell Wonda, MD     Signed, Ozell Wonda, MD  01/14/2025 1:57 PM    [facility]     [1]  Current Meds  Medication Sig   albuterol  (VENTOLIN  HFA) 108 (90 Base) MCG/ACT inhaler INHALE 2 PUFFS BY MOUTH EVERY 6 HOURS AS NEEDED FOR WHEEZING FOR SHORTNESS OF BREATH   amoxicillin  (AMOXIL ) 500 MG tablet TAKE 2 CAPSULES BY MOUTH ONE HOUR BEFORE APPOINTMENT AND 2 IMMEDIATELY AFTER THE APPOINTMENT (Patient taking differently: 2 caps before dental procedure and 2 after the procedure)   apixaban  (ELIQUIS ) 5 MG TABS tablet Take 1 tablet by mouth twice daily   ARNUITY ELLIPTA  100 MCG/ACT AEPB Inhale 1 puff into the lungs daily.   bimatoprost (LUMIGAN) 0.01 % SOLN Place 1 drop into both eyes at bedtime.   Cholecalciferol  25 MCG (1000 UT) tablet Take 1,000 Units by mouth in the morning.   diclofenac Sodium (VOLTAREN) 1 % GEL Apply 2 g topically 4 (four) times daily as needed (knee pain).   dorzolamide -timolol  (COSOPT ) 22.3-6.8 MG/ML ophthalmic solution Place 1 drop into both eyes 2 (two) times daily.    finasteride  (PROSCAR ) 5 MG tablet Take 5 mg by mouth in the morning.   fluticasone  (FLONASE ) 50 MCG/ACT nasal Derrick [name] Place 2 sprays into both nostrils 2 (two) times daily as needed (nasal congestion.).   metoprolol  succinate (TOPROL -XL) 50 MG 24 hr tablet Take 1 tablet (50 mg total) by mouth daily. Take with or immediately following a meal.   Multiple Vitamin (MULTIVITAMIN WITH MINERALS) TABS tablet Take 1 tablet by mouth every evening.   pantoprazole  (PROTONIX ) 40 MG tablet Take 1 tablet (40 mg total) by mouth daily.   Polyethyl Glycol-Propyl Glycol (SYSTANE OP) Place 1 drop into both eyes 3 (three) times daily as needed (dry/irritated eyes.).   rosuvastatin  (CRESTOR ) 20 MG tablet Take 1 tablet by mouth once daily   sacubitril -valsartan  (ENTRESTO ) 24-26 MG Take 1 tablet by mouth twice daily   tadalafil  (CIALIS ) 5 MG tablet Take  5 mg by mouth at  bedtime.    triamcinolone  cream (KENALOG ) 0.1 % Apply 1 Application topically 2 (two) times daily as needed (skin irritation).   triamterene -hydrochlorothiazide  (MAXZIDE -25) 37.5-25 MG tablet Take 1 tablet by mouth daily.   "

## 2025-01-14 NOTE — Patient Instructions (Signed)
 Medication Instructions:  No medication changes were made at this visit. Continue current regimen.   *If you need a refill on your cardiac medications before your next appointment, please call your pharmacy*  Lab Work: None ordered today. If you have labs (blood work) drawn today and your tests are completely normal, you will receive your results only by: MyChart Message (if you have MyChart) OR A paper copy in the mail If you have any lab test that is abnormal or we need to change your treatment, we will call you to review the results.  Testing/Procedures: Your physician has requested that you have an echocardiogram prior to 1 year follow-up with Dr. Wonda. Echocardiography is a painless test that uses sound waves to create images of your heart. It provides your doctor with information about the size and shape of your heart and how well your heart's chambers and valves are working. This procedure takes approximately one hour. There are no restrictions for this procedure. Please do NOT wear cologne, perfume, aftershave, or lotions (deodorant is allowed). Please arrive 15 minutes prior to your appointment time.  Please note: We ask at that you not bring children with you during ultrasound (echo/ vascular) testing. Due to room size and safety concerns, children are not allowed in the ultrasound rooms during exams. Our front office staff cannot provide observation of children in our lobby area while testing is being conducted. An adult accompanying a patient to their appointment will only be allowed in the ultrasound room at the discretion of the ultrasound technician under special circumstances. We apologize for any inconvenience.   Follow-Up: At [facility], you and your health needs are our priority.  As part of our continuing mission to provide you with exceptional heart care, our providers are all part of one team.  This team includes your primary Cardiologist (physician) and  Advanced Practice Providers or APPs (Physician Assistants and Nurse Practitioners) who all work together to provide you with the care you need, when you need it.  Your next appointment:   1 year(s)  Provider:   Ozell Wonda, MD

## 2025-01-14 NOTE — Assessment & Plan Note (Addendum)
 Followed by Dr. Inocencio.  Continue apixaban  for anticoagulation.  Continue metoprolol  succinate.

## 2025-05-17 ENCOUNTER — Inpatient Hospital Stay

## 2025-05-31 ENCOUNTER — Inpatient Hospital Stay: Admitting: Hematology and Oncology

## 2025-11-07 ENCOUNTER — Other Ambulatory Visit

## 2025-11-13 ENCOUNTER — Ambulatory Visit

## 2025-11-14 ENCOUNTER — Encounter: Admitting: Family Medicine

## 2026-01-13 ENCOUNTER — Ambulatory Visit (HOSPITAL_COMMUNITY)
